# Patient Record
Sex: Female | Born: 1950
Health system: Southern US, Community
[De-identification: ages and names within clinical notes are randomized; demographics above are authoritative.]

## PROBLEM LIST (undated history)

## (undated) DIAGNOSIS — Z87442 Personal history of urinary calculi: Secondary | ICD-10-CM

## (undated) DIAGNOSIS — G5702 Lesion of sciatic nerve, left lower limb: Secondary | ICD-10-CM

## (undated) DIAGNOSIS — D72829 Elevated white blood cell count, unspecified: Secondary | ICD-10-CM

## (undated) DIAGNOSIS — E785 Hyperlipidemia, unspecified: Secondary | ICD-10-CM

## (undated) DIAGNOSIS — F32A Depression, unspecified: Secondary | ICD-10-CM

## (undated) DIAGNOSIS — K219 Gastro-esophageal reflux disease without esophagitis: Secondary | ICD-10-CM

## (undated) DIAGNOSIS — R072 Precordial pain: Secondary | ICD-10-CM

## (undated) DIAGNOSIS — D509 Iron deficiency anemia, unspecified: Secondary | ICD-10-CM

## (undated) DIAGNOSIS — H9190 Unspecified hearing loss, unspecified ear: Secondary | ICD-10-CM

## (undated) DIAGNOSIS — F419 Anxiety disorder, unspecified: Secondary | ICD-10-CM

## (undated) DIAGNOSIS — E8881 Metabolic syndrome: Secondary | ICD-10-CM

## (undated) DIAGNOSIS — E669 Obesity, unspecified: Secondary | ICD-10-CM

## (undated) DIAGNOSIS — E88819 Insulin resistance, unspecified: Secondary | ICD-10-CM

## (undated) DIAGNOSIS — F329 Major depressive disorder, single episode, unspecified: Secondary | ICD-10-CM

## (undated) DIAGNOSIS — F418 Other specified anxiety disorders: Secondary | ICD-10-CM

## (undated) DIAGNOSIS — M47812 Spondylosis without myelopathy or radiculopathy, cervical region: Secondary | ICD-10-CM

## (undated) DIAGNOSIS — I2510 Atherosclerotic heart disease of native coronary artery without angina pectoris: Secondary | ICD-10-CM

## (undated) DIAGNOSIS — I10 Essential (primary) hypertension: Secondary | ICD-10-CM

## (undated) DIAGNOSIS — E1140 Type 2 diabetes mellitus with diabetic neuropathy, unspecified: Secondary | ICD-10-CM

## (undated) DIAGNOSIS — E119 Type 2 diabetes mellitus without complications: Secondary | ICD-10-CM

## (undated) DIAGNOSIS — T8859XA Other complications of anesthesia, initial encounter: Secondary | ICD-10-CM

## (undated) HISTORY — PX: BREAST ENHANCEMENT SURGERY: SHX7

## (undated) HISTORY — DX: Hyperlipidemia, unspecified: E78.5

## (undated) HISTORY — DX: Anxiety disorder, unspecified: F41.9

## (undated) HISTORY — PX: TONSILLECTOMY AND ADENOIDECTOMY: SUR1326

## (undated) HISTORY — DX: Precordial pain: R07.2

## (undated) HISTORY — DX: Essential (primary) hypertension: I10

## (undated) HISTORY — PX: LUMBAR LAMINECTOMY: SHX95

## (undated) HISTORY — DX: Obesity, unspecified: E66.9

## (undated) HISTORY — PX: TUBAL LIGATION: SHX77

## (undated) HISTORY — PX: ROTATOR CUFF REPAIR: SHX139

## (undated) HISTORY — DX: Type 2 diabetes mellitus without complications: E11.9

## (undated) HISTORY — DX: Major depressive disorder, single episode, unspecified: F32.9

## (undated) HISTORY — DX: Depression, unspecified: F32.A

## (undated) HISTORY — PX: OTHER SURGICAL HISTORY: SHX169

## (undated) HISTORY — DX: Spondylosis without myelopathy or radiculopathy, cervical region: M47.812

---

## 1997-12-02 ENCOUNTER — Ambulatory Visit (HOSPITAL_COMMUNITY): Admission: RE | Admit: 1997-12-02 | Discharge: 1997-12-02 | Payer: Self-pay | Admitting: Psychiatry

## 1998-01-06 ENCOUNTER — Other Ambulatory Visit: Admission: RE | Admit: 1998-01-06 | Discharge: 1998-01-06 | Payer: Self-pay | Admitting: *Deleted

## 1998-09-13 ENCOUNTER — Encounter: Admission: RE | Admit: 1998-09-13 | Discharge: 1998-12-12 | Payer: Self-pay | Admitting: Psychiatry

## 1998-11-05 ENCOUNTER — Encounter: Payer: Self-pay | Admitting: Emergency Medicine

## 1998-11-05 ENCOUNTER — Emergency Department (HOSPITAL_COMMUNITY): Admission: EM | Admit: 1998-11-05 | Discharge: 1998-11-05 | Payer: Self-pay | Admitting: Emergency Medicine

## 1998-12-04 ENCOUNTER — Other Ambulatory Visit: Admission: RE | Admit: 1998-12-04 | Discharge: 1998-12-04 | Payer: Self-pay | Admitting: *Deleted

## 1999-05-01 ENCOUNTER — Encounter: Payer: Self-pay | Admitting: Emergency Medicine

## 1999-05-01 ENCOUNTER — Emergency Department (HOSPITAL_COMMUNITY): Admission: EM | Admit: 1999-05-01 | Discharge: 1999-05-01 | Payer: Self-pay | Admitting: Emergency Medicine

## 1999-10-12 ENCOUNTER — Ambulatory Visit (HOSPITAL_COMMUNITY): Admission: RE | Admit: 1999-10-12 | Discharge: 1999-10-12 | Payer: Self-pay | Admitting: Psychiatry

## 1999-10-12 ENCOUNTER — Encounter: Payer: Self-pay | Admitting: Psychiatry

## 1999-11-08 ENCOUNTER — Emergency Department (HOSPITAL_COMMUNITY): Admission: EM | Admit: 1999-11-08 | Discharge: 1999-11-08 | Payer: Self-pay | Admitting: Emergency Medicine

## 2000-01-19 ENCOUNTER — Encounter: Payer: Self-pay | Admitting: Emergency Medicine

## 2000-01-19 ENCOUNTER — Emergency Department (HOSPITAL_COMMUNITY): Admission: EM | Admit: 2000-01-19 | Discharge: 2000-01-19 | Payer: Self-pay | Admitting: Emergency Medicine

## 2000-02-07 ENCOUNTER — Other Ambulatory Visit: Admission: RE | Admit: 2000-02-07 | Discharge: 2000-02-07 | Payer: Self-pay | Admitting: *Deleted

## 2001-02-09 ENCOUNTER — Other Ambulatory Visit: Admission: RE | Admit: 2001-02-09 | Discharge: 2001-02-09 | Payer: Self-pay | Admitting: *Deleted

## 2001-02-19 ENCOUNTER — Encounter: Payer: Self-pay | Admitting: *Deleted

## 2001-02-19 ENCOUNTER — Encounter: Admission: RE | Admit: 2001-02-19 | Discharge: 2001-02-19 | Payer: Self-pay | Admitting: *Deleted

## 2001-03-06 ENCOUNTER — Other Ambulatory Visit: Admission: RE | Admit: 2001-03-06 | Discharge: 2001-03-06 | Payer: Self-pay | Admitting: *Deleted

## 2001-03-06 ENCOUNTER — Encounter (INDEPENDENT_AMBULATORY_CARE_PROVIDER_SITE_OTHER): Payer: Self-pay | Admitting: *Deleted

## 2001-04-20 ENCOUNTER — Encounter (INDEPENDENT_AMBULATORY_CARE_PROVIDER_SITE_OTHER): Payer: Self-pay | Admitting: *Deleted

## 2001-04-20 ENCOUNTER — Ambulatory Visit (HOSPITAL_COMMUNITY): Admission: RE | Admit: 2001-04-20 | Discharge: 2001-04-20 | Payer: Self-pay | Admitting: Gastroenterology

## 2001-06-01 ENCOUNTER — Encounter: Payer: Self-pay | Admitting: Emergency Medicine

## 2001-06-01 ENCOUNTER — Inpatient Hospital Stay (HOSPITAL_COMMUNITY): Admission: EM | Admit: 2001-06-01 | Discharge: 2001-06-02 | Payer: Self-pay | Admitting: Emergency Medicine

## 2001-08-10 ENCOUNTER — Ambulatory Visit (HOSPITAL_BASED_OUTPATIENT_CLINIC_OR_DEPARTMENT_OTHER): Admission: RE | Admit: 2001-08-10 | Discharge: 2001-08-10 | Payer: Self-pay | Admitting: Orthopedic Surgery

## 2001-08-27 ENCOUNTER — Ambulatory Visit (HOSPITAL_COMMUNITY): Admission: RE | Admit: 2001-08-27 | Discharge: 2001-08-27 | Payer: Self-pay | Admitting: Pulmonary Disease

## 2001-08-27 ENCOUNTER — Encounter: Payer: Self-pay | Admitting: Pulmonary Disease

## 2002-12-08 ENCOUNTER — Ambulatory Visit (HOSPITAL_COMMUNITY): Admission: RE | Admit: 2002-12-08 | Discharge: 2002-12-08 | Payer: Self-pay | Admitting: Family Medicine

## 2002-12-08 ENCOUNTER — Encounter: Payer: Self-pay | Admitting: Family Medicine

## 2003-10-18 ENCOUNTER — Emergency Department (HOSPITAL_COMMUNITY): Admission: EM | Admit: 2003-10-18 | Discharge: 2003-10-19 | Payer: Self-pay | Admitting: Emergency Medicine

## 2004-04-23 ENCOUNTER — Ambulatory Visit (HOSPITAL_COMMUNITY): Admission: RE | Admit: 2004-04-23 | Discharge: 2004-04-23 | Payer: Self-pay | Admitting: Internal Medicine

## 2005-01-06 ENCOUNTER — Emergency Department (HOSPITAL_COMMUNITY): Admission: EM | Admit: 2005-01-06 | Discharge: 2005-01-06 | Payer: Self-pay | Admitting: Emergency Medicine

## 2005-03-28 ENCOUNTER — Ambulatory Visit: Payer: Self-pay | Admitting: Pulmonary Disease

## 2005-05-08 ENCOUNTER — Ambulatory Visit: Payer: Self-pay | Admitting: Pulmonary Disease

## 2005-05-09 ENCOUNTER — Ambulatory Visit: Payer: Self-pay | Admitting: Cardiology

## 2005-05-21 ENCOUNTER — Ambulatory Visit (HOSPITAL_COMMUNITY): Admission: RE | Admit: 2005-05-21 | Discharge: 2005-05-21 | Payer: Self-pay | Admitting: Pulmonary Disease

## 2005-06-26 ENCOUNTER — Ambulatory Visit: Payer: Self-pay | Admitting: Internal Medicine

## 2005-07-15 HISTORY — PX: CORONARY STENT PLACEMENT: SHX1402

## 2005-07-24 ENCOUNTER — Inpatient Hospital Stay (HOSPITAL_COMMUNITY): Admission: EM | Admit: 2005-07-24 | Discharge: 2005-07-25 | Payer: Self-pay | Admitting: Emergency Medicine

## 2005-07-28 ENCOUNTER — Emergency Department (HOSPITAL_COMMUNITY): Admission: EM | Admit: 2005-07-28 | Discharge: 2005-07-28 | Payer: Self-pay | Admitting: *Deleted

## 2005-07-30 ENCOUNTER — Encounter: Admission: RE | Admit: 2005-07-30 | Discharge: 2005-07-30 | Payer: Self-pay | Admitting: Cardiology

## 2005-09-11 ENCOUNTER — Encounter: Admission: RE | Admit: 2005-09-11 | Discharge: 2005-09-26 | Payer: Self-pay | Admitting: Neurosurgery

## 2005-10-28 ENCOUNTER — Emergency Department (HOSPITAL_COMMUNITY): Admission: EM | Admit: 2005-10-28 | Discharge: 2005-10-28 | Payer: Self-pay | Admitting: Emergency Medicine

## 2006-11-17 ENCOUNTER — Ambulatory Visit (HOSPITAL_COMMUNITY): Admission: RE | Admit: 2006-11-17 | Discharge: 2006-11-19 | Payer: Self-pay | Admitting: Neurosurgery

## 2007-06-17 ENCOUNTER — Inpatient Hospital Stay (HOSPITAL_BASED_OUTPATIENT_CLINIC_OR_DEPARTMENT_OTHER): Admission: RE | Admit: 2007-06-17 | Discharge: 2007-06-17 | Payer: Self-pay | Admitting: Cardiology

## 2007-06-17 HISTORY — PX: CARDIAC CATHETERIZATION: SHX172

## 2007-06-24 ENCOUNTER — Encounter: Admission: RE | Admit: 2007-06-24 | Discharge: 2007-06-24 | Payer: Self-pay | Admitting: Cardiology

## 2008-04-15 ENCOUNTER — Ambulatory Visit (HOSPITAL_COMMUNITY): Admission: RE | Admit: 2008-04-15 | Discharge: 2008-04-15 | Payer: Self-pay | Admitting: Internal Medicine

## 2008-04-29 ENCOUNTER — Encounter: Admission: RE | Admit: 2008-04-29 | Discharge: 2008-04-29 | Payer: Self-pay | Admitting: Internal Medicine

## 2008-05-16 ENCOUNTER — Encounter: Payer: Self-pay | Admitting: Gastroenterology

## 2008-06-28 ENCOUNTER — Ambulatory Visit: Payer: Self-pay | Admitting: Gastroenterology

## 2008-06-28 DIAGNOSIS — K219 Gastro-esophageal reflux disease without esophagitis: Secondary | ICD-10-CM

## 2008-06-28 DIAGNOSIS — I2510 Atherosclerotic heart disease of native coronary artery without angina pectoris: Secondary | ICD-10-CM | POA: Insufficient documentation

## 2008-06-28 HISTORY — DX: Gastro-esophageal reflux disease without esophagitis: K21.9

## 2008-06-28 HISTORY — DX: Atherosclerotic heart disease of native coronary artery without angina pectoris: I25.10

## 2008-06-29 ENCOUNTER — Encounter: Payer: Self-pay | Admitting: Gastroenterology

## 2008-07-15 HISTORY — PX: COLONOSCOPY: SHX174

## 2008-07-15 HISTORY — PX: FOREARM FRACTURE SURGERY: SHX649

## 2008-07-27 ENCOUNTER — Ambulatory Visit: Payer: Self-pay | Admitting: Gastroenterology

## 2008-07-27 ENCOUNTER — Encounter: Payer: Self-pay | Admitting: Gastroenterology

## 2008-07-29 ENCOUNTER — Encounter: Payer: Self-pay | Admitting: Gastroenterology

## 2008-11-03 ENCOUNTER — Encounter: Admission: RE | Admit: 2008-11-03 | Discharge: 2008-11-03 | Payer: Self-pay | Admitting: Internal Medicine

## 2009-05-03 ENCOUNTER — Encounter: Admission: RE | Admit: 2009-05-03 | Discharge: 2009-05-03 | Payer: Self-pay | Admitting: Internal Medicine

## 2010-03-30 ENCOUNTER — Ambulatory Visit: Payer: Self-pay | Admitting: Cardiovascular Disease

## 2010-04-27 ENCOUNTER — Encounter: Payer: Self-pay | Admitting: Emergency Medicine

## 2010-04-27 ENCOUNTER — Ambulatory Visit: Payer: Self-pay | Admitting: Cardiology

## 2010-04-27 ENCOUNTER — Encounter: Admission: RE | Admit: 2010-04-27 | Discharge: 2010-04-27 | Payer: Self-pay | Admitting: Cardiology

## 2010-05-11 ENCOUNTER — Ambulatory Visit: Payer: Self-pay | Admitting: Emergency Medicine

## 2010-05-11 DIAGNOSIS — E669 Obesity, unspecified: Secondary | ICD-10-CM

## 2010-05-11 DIAGNOSIS — R053 Chronic cough: Secondary | ICD-10-CM | POA: Insufficient documentation

## 2010-05-11 DIAGNOSIS — R05 Cough: Secondary | ICD-10-CM | POA: Insufficient documentation

## 2010-05-14 ENCOUNTER — Telehealth (INDEPENDENT_AMBULATORY_CARE_PROVIDER_SITE_OTHER): Payer: Self-pay | Admitting: *Deleted

## 2010-07-26 ENCOUNTER — Ambulatory Visit: Payer: Self-pay | Admitting: Cardiology

## 2010-08-05 ENCOUNTER — Encounter: Payer: Self-pay | Admitting: *Deleted

## 2010-08-05 ENCOUNTER — Encounter: Payer: Self-pay | Admitting: Family Medicine

## 2010-08-16 NOTE — Letter (Signed)
Summary: Chest 2 View/[facility]  Chest 2 View/[facility]   Imported By: Sherian Rein 05/16/2010 12:16:34  _____________________________________________________________________  External Attachment:    Type:   Image     Comment:   External Document

## 2010-08-16 NOTE — Procedures (Signed)
Summary: Hold on Medication (Plavix) for Procedure  Hold on Medication (Plavix) for Procedure   Imported By: Esmeralda Links D'jimraou 07/07/2008 13:18:23  _____________________________________________________________________  External Attachment:    Type:   Image     Comment:   External Document

## 2010-08-16 NOTE — Letter (Signed)
Summary: Patient [facility] Biopsy Results  [facility]  [address] [place], [place] [address]   Phone: [phone]  Fax: [phone]        July 29, 2008 MRN: [id number]    Destiny Ballard [address] [place], [place]  [address]    Dear Ms. Choplin,  I am pleased to inform you that the biopsies taken during your recent endoscopic examination did not show any evidence of cancer upon pathologic examination. The stomach biopsy showed mild gastritis. The duodenal biopsy was normal.  Continue with the treatment plan as outlined on the day of your      exam.  Please call us if you are having persistent problems or have questions about your condition that have not been fully answered at this time.  Sincerely,  Meryl Dare MD [facility]  This letter has been electronically signed by your physician.

## 2010-08-16 NOTE — Letter (Signed)
Summary: [facility]   Imported By: Lester  05/29/2010 07:25:19  _____________________________________________________________________  External Attachment:    Type:   Image     Comment:   External Document

## 2010-08-16 NOTE — Procedures (Signed)
Summary: Colonoscopy   Colonoscopy  Procedure date:  04/20/2001  Findings:      Results: Normal. Location:  [facility].    Procedures Next Due Date:    Colonoscopy: 04/2011  Patient Name: Destiny Ballard, Destiny Ballard. MRN: [id number] Procedure Procedures: Colonoscopy CPT: [phone].  Personnel: Endoscopist: Griffith Citron, MD, [facility].  Exam Location: Exam performed in Endoscopy Suite. Outpatient  Patient Consent: Procedure, Alternatives, Risks and Benefits discussed, consent obtained, from patient.  Indications  Evaluation of: Polyps seen on prior Colonoscopy.  History  Current Medications: Patient is taking an non-steroidal medication. Other: Fluoxetidine Antihistamines: Allegra Other: Toprol Other: Premarin Other: Provera Other: Prevacid Other: Naprosyn Other: Aspirin Other: Levbid  Medical/ Surgical History: Depression, Hypertension, Dyspepsia, Arthralgias, Irritable Bowel Syndrome,  Allergies: No known allergies.  Patient Habits Patient does not smoke. non-drinker drinks per day.  Pre-Exam Physical: Performed Apr 20, 2001. Cardio-pulmonary exam, Rectal exam, HEENT exam , Abdominal exam, Extremity exam, Mental status exam WNL.  Exam Exam: Extent of exam reached: Cecum, extent intended: Cecum.  The cecum was identified by appendiceal orifice and IC valve. Patient position: on left side. Duration of exam: 10 minutes. Colon retroflexion performed. Images taken. ASA Classification: I. Tolerance: excellent.  Monitoring: Pulse and BP monitoring, Oximetry used. Supplemental O2 given.  Colon Prep Used Visicol for colon prep. Dose Used: 28 tablets. Prep results: excellent.  Sedation Meds: Patient assessed and found to be appropriate for moderate (conscious) sedation. Sedation was managed by the Endoscopist. Versed 10 mg. Fentanyl 100 mcg.  Comments: Adjustable scope used. Findings NORMAL EXAM: to Rectum. No colorectal neoplasia.  No rectal lesion corresponding to  CT findings.    Comments: Time 1; tech 1; prep 1; Total = 3 Assessment Normal examination.  Events  Unplanned Interventions: No intervention was required.  Unplanned Events: There were no complications. Plans  Post Exam Instructions: Resume previous diet: 2 hours. Restart medications: tonight.  Medication Plan: Continue current medications.  Patient Education: Patient given standard instructions for: Yearly hemoccult testing recommended. Patient instructed to get routine colonoscopy every 10 years.  Disposition: After procedure patient sent to recovery. After recovery patient sent home.  Scheduling/Referral: Follow-Up prn.   This report was created from the original endoscopy report, which was reviewed and signed by the above listed endoscopist.    cc: Lonell Grandchild. Theda Belfast, MD

## 2010-08-16 NOTE — Assessment & Plan Note (Signed)
Summary: cough   Visit Type:  Initial Consult Copy to:  Dr. Deborah Chalk Primary Provider/Referring Provider:  Abbe Amsterdam, MD  CC:  Pulmonary Consult - cough..  History of Present Illness: 60 yo never smoker, hx CAD s/p PTCI, HTN, GERD, HA's, depression. Has been seen here for dyspnea and cough in the past by DS. She knows that she has some allergies that impact her cough, ? dogs, ? second-hand smoke. She coughs every day, non-productive. Has a tickle in her throat, occas loses her voice or gets hoarse. For last two weeks has been worse - more freq, some sneezing, runny nose. Has reflux symptoms at night, wakes in middle of night coughing. Daily bouts of GERD symptoms after she eats.   Current Medications (verified): 1)  Metoprolol Tartrate 100 Mg Tabs (Metoprolol Tartrate) .... Take 1 Tablet By Mouth Once A Day 2)  Crestor 20 Mg Tabs (Rosuvastatin Calcium) .... Take 1 Tablet By Mouth Once A Day 3)  Diovan Hct 320-25 Mg Tabs (Valsartan-Hydrochlorothiazide) .... Take 1 Tablet By Mouth Once A Day 4)  Plavix 75 Mg Tabs (Clopidogrel Bisulfate) .... Take One Tablet By Mouth Every Other Day 5)  Fluoxetine Hcl 40 Mg Caps (Fluoxetine Hcl) .... Take 1 Tablet By Mouth Once A Day 6)  Adult Aspirin Ec Low Strength 81 Mg Tbec (Aspirin) .... Take 1 Tablet By Mouth Once A Day 7)  Benzonatate 100 Mg Caps (Benzonatate) .[name].. 1-2 Cap Three Times A Day 8)  Vitamin D (Ergocalciferol) 50000 Unit Caps (Ergocalciferol) .... Once Weekly 9)  Proair Hfa 108 (90 Base) Mcg/act Aers (Albuterol Sulfate) .... 2 Puffs Every 4-6 Hours As Needed 10)  Fluticasone Propionate 50 Mcg/act Susp (Fluticasone Propionate) .... 2 Sprays Each Nostril Once Daily 11)  Vitamin E 200 Unit Caps (Vitamin E) .... Take 1 Capsule By Mouth Once A Day 12)  Allegra Allergy 180 Mg Tabs (Fexofenadine Hcl) .... Take 1 Tablet By Mouth Once A Day 13)  Multivitamins  Tabs (Multiple Vitamin) .... Take 1 Tablet By Mouth Once A Day 14)  Vitamin B-12 1000  Mcg Tabs (Cyanocobalamin) .... As Needed 15)  Calcium + D 600-200 Mg-Unit Tabs (Calcium Carbonate-Vitamin D) .... Take 1 Tablet By Mouth Once A Day  Allergies (verified): 1)  Prednisone  Past History:  Past Medical History: Reviewed history from 06/27/2008 and no changes required. GERD Hypertension Allergies  Chronic headaches Atherosclerotic cardiovascular disease Anxiety Disorder Depression Recent suicide attempt Hyperlipidemia Cervical spondylosis  Past Surgical History: Reviewed history from 06/28/2008 and no changes required. Breast implants Tubal Ligation cardiac cath, Mid LAD stent x 3, 07/2005 ACF fusion and plating-11/2006  Family History: Reviewed history from 06/28/2008 and no changes required. Family History of Diabetes: Mother, Sister, Brother Family History of Heart Disease: Mother Father died of a suicide.  Mother had Cancer--unsure type sister deceased from lung ca  Social History: Reviewed history from 06/28/2008 and no changes required. Married Patient has never smoked.  Alcohol Use - no Daily Caffeine Use Illicit Drug Use - no 3 children Works with [employer]  Review of Systems       The patient complains of shortness of breath with activity, productive cough, indigestion, abdominal pain, headaches, sneezing, depression, and joint stiffness or pain.  The patient denies shortness of breath at rest, non-productive cough, coughing up blood, chest pain, irregular heartbeats, acid heartburn, loss of appetite, weight change, difficulty swallowing, sore throat, tooth/dental problems, nasal congestion/difficulty breathing through nose, itching, anxiety, hand/feet swelling, rash, change in color of mucus,  and fever.         Wt gain of about 40 lbs since 2006.    Vital Signs:  Patient profile:   60 year old female Height:      60 inches Weight:      175.38 pounds BMI:     34.38 O2 Sat:      96 % on Room air Temp:     98.4 degrees F oral Pulse rate:   95 /  minute BP sitting:   152 / 84  (left arm) Cuff size:   regular  Vitals Entered By: Gweneth Dimitri RN (May 11, 2010 3:19 PM)  O2 Flow:  Room air CC: Pulmonary Consult - cough. Comments Medications reviewed with patient Daytime contact number verified with patient. Gweneth Dimitri RN  May 11, 2010 3:20 PM    Physical Exam  General:  Well developed, well nourished, no acute distress.obese.   Head:  Normocephalic and atraumatic. Eyes:  conjunctiva and sclera clear Mouth:  No deformity or lesions, dentition normal. Neck:  Supple; no masses or thyromegaly. Lungs:  Clear throughout to auscultation. Heart:  Regular rate and rhythm; no murmurs, rubs,  or bruits. Abdomen:  obese, non-tender Msk:  Symmetrical with no gross deformities. Normal posture. Pulses:  Normal pulses noted. Extremities:  No clubbing, cyanosis, edema or deformities noted. Neurologic:  Alert and  oriented x4;  grossly normal neurologically. Skin:  Intact without significant lesions or rashes. Psych:  Alert and cooperative. Normal mood and affect.   Impression & Recommendations:  Problem # 1:  COUGH (ICD-786.2)  With influences of both GERD and allergies.  - PFT to eval for AFL - rx allergies: increase fluticasone to two times a day, contin allegra, add NSWs - rx GERD: add prilosec two times a day till next visit then will consider decreas to once daily - rov in 1 mo  Orders: Consultation Level IV (06301)  Problem # 2:  CORONARY ARTERY DISEASE (ICD-414.00)  Her updated medication list for this problem includes:    Metoprolol Tartrate 100 Mg Tabs (Metoprolol tartrate) .[name]... Take 1 tablet by mouth once a day    Diovan Hct 320-25 Mg Tabs (Valsartan-hydrochlorothiazide) .[name]... Take 1 tablet by mouth once a day    Plavix 75 Mg Tabs (Clopidogrel bisulfate) .[name]... Take one tablet by mouth every other day    Adult Aspirin Ec Low Strength 81 Mg Tbec (Aspirin) .[name]... Take 1 tablet by mouth once a day  Problem #  3:  MORBID OBESITY (ICD-278.01)  Orders: Consultation Level IV (60109)  Medications Added to Medication List This Visit: 1)  Metoprolol Tartrate 100 Mg Tabs (Metoprolol tartrate) .... Take 1 tablet by mouth once a day 2)  Crestor 20 Mg Tabs (Rosuvastatin calcium) .... Take 1 tablet by mouth once a day 3)  Diovan Hct 320-25 Mg Tabs (Valsartan-hydrochlorothiazide) .... Take 1 tablet by mouth once a day 4)  Plavix 75 Mg Tabs (Clopidogrel bisulfate) .... Take one tablet by mouth every other day 5)  Benzonatate 100 Mg Caps (Benzonatate) .[name].. 1-2 cap three times a day 6)  Vitamin D (ergocalciferol) 50000 Unit Caps (Ergocalciferol) .... Once weekly 7)  Proair Hfa 108 (90 Base) Mcg/act Aers (Albuterol sulfate) .... 2 puffs every 4-6 hours as needed 8)  Fluticasone Propionate 50 Mcg/act Susp (Fluticasone propionate) .... 2 sprays each nostril once daily 9)  Vitamin E 200 Unit Caps (Vitamin e) .... Take 1 capsule by mouth once a day 10)  Allegra Allergy 180 Mg Tabs (  Fexofenadine hcl) .... Take 1 tablet by mouth once a day 11)  Multivitamins Tabs (Multiple vitamin) .... Take 1 tablet by mouth once a day 12)  Vitamin B-12 1000 Mcg Tabs (Cyanocobalamin) .... As needed 13)  Calcium + D 600-200 Mg-unit Tabs (Calcium carbonate-vitamin d) .... Take 1 tablet by mouth once a day 14)  Omeprazole 20 Mg Cpdr (Omeprazole) .[name].. 1 by mouth two times a day for 1 month then decrease to 1 once daily  Patient Instructions: 1)  Start nasal saline washes every day 2)  Increase your fluticasone spray to 2 sprays each nostril two times a day  3)  Continue fexofenadine 180mg  once daily  4)  Start omeprazole 20mg  by mouth two times a day until next visit. we may decrease to once daily at that time 5)  Full PFT's at the time of your next visit 6)  Follow up with [facility] in 1 month Prescriptions: OMEPRAZOLE 20 MG CPDR (OMEPRAZOLE) 1 by mouth two times a day for 1 month then decrease to 1 once daily  #60 x 6   Entered and  Authorized by:   Leslye Peer MD   Signed by:   Leslye Peer MD on 05/11/2010   Method used:   Electronically to        [facility] [address] [id number] (retail)       [address]       [place], [place]  [address]       Ph: [phone]       Fax: [phone]   RxID:   [id number]    Immunization History:  Influenza Immunization History:    Influenza:  historical (04/14/2010)

## 2010-08-16 NOTE — Procedures (Signed)
Summary: Colonoscopy   Colonoscopy  Procedure date:  07/27/2008  Findings:      Location:  [facility].    Procedures Next Due Date:    Colonoscopy: 07/2018  COLONOSCOPY PROCEDURE REPORT  PATIENT:  Destiny Ballard, Destiny Ballard  MR#:  [id number] BIRTHDATE:   06-Oct-1950   GENDER:   female  ENDOSCOPIST:   Venita Lick. Russella Dar, MD, [facility] Referred by: Abbe Amsterdam, M.D.  PROCEDURE DATE:  07/27/2008 PROCEDURE:  Colonoscopy, diagnostic ASA CLASS:   Class II INDICATIONS: 1) iron deficiency anemia  2) hematochezia   MEDICATIONS:    Fentanyl 100 mcg IV, Versed 10 mg IV  DESCRIPTION OF PROCEDURE:   After the risks benefits and alternatives of the procedure were thoroughly explained, informed consent was obtained.  Digital rectal exam was performed and revealed no abnormalities.   The LB [id number]-H180AL [id number] endoscope was introduced through the anus and advanced to the cecum, which was identified by both the appendix and ileocecal valve, without limitations.  The quality of the prep was excellent, using MoviPrep.  The instrument was then slowly withdrawn as the colon was fully examined. <<PROCEDUREIMAGES>>          <<OLD IMAGES>>  FINDINGS:  A normal appearing cecum, ileocecal valve, and appendiceal orifice were identified. The ascending, hepatic flexure, transverse, splenic flexure, descending, sigmoid colon, and rectum appeared unremarkable. Retroflexed views in the rectum revealed no abnormalities. The time to cecum =  4.67  minutes. The scope was then withdrawn (time =  8  min) from the patient and the procedure completed.  COMPLICATIONS:   None  ENDOSCOPIC IMPRESSION:  1) Normal colonoscopy RECOMMENDATIONS:  1) upper endoscopy (EGD) today  REPEAT EXAM:   In 10 year(s) for Colonoscopy.   Venita Lick. Russella Dar, MD, [facility]    CC:

## 2010-08-16 NOTE — Letter (Signed)
Summary: Anticoagulation Modification Letter  [facility]  [address] [place], [place] [address]   Phone: [phone]  Fax: [phone]    June 29, 2008  Re:    Destiny Ballard DOB:    10/15/1950 MRN:    [id number]    Dear Dr. Deborah Chalk,   We have scheduled the above patient for an endoscopic procedure. Our records show that  he/she is on anticoagulation therapy. Please advise as to how long the patient may come off their therapy of Plavix prior to the scheduled procedure(s) on 07/27/08.   Please fax back/or route the completed form to [place] at [phone].  Thank you for your help with this matter.  Sincerely,  Christie Nottingham CMA   Physician Recommendation:  Hold Plavix 7 days prior ________________  Hold Coumadin 5 days prior ____________  Other ______________________________

## 2010-08-16 NOTE — Procedures (Signed)
Summary: EGD   EGD  Procedure date:  07/27/2008  Findings:      Location: [facility]    ENDOSCOPY PROCEDURE REPORT  PATIENT:  Destiny Ballard, Destiny Ballard  MR#:  [id number] BIRTHDATE:   1951/06/28   GENDER:   female  ENDOSCOPIST:   Venita Lick. Russella Dar, MD, [facility] Referred by: Abbe Amsterdam, M.D.  PROCEDURE DATE:  07/27/2008 PROCEDURE:  EGD with biopsy ASA CLASS:   Class II INDICATIONS: iron deficiency anemia, GERD   MEDICATIONS:   There was residual sedation effect present from prior procedure, Versed 2 mg IV TOPICAL ANESTHETIC:   Exactacain Spray  DESCRIPTION OF PROCEDURE:   After the risks benefits and alternatives of the procedure were thoroughly explained, informed consent was obtained.  The LB GIF-H180 [id number] endoscope was introduced through the mouth and advanced to the second portion of the duodenum, without limitations.  The instrument was slowly withdrawn as the mucosa was fully examined. <<PROCEDUREIMAGES>>          <<OLD IMAGES>>  The duodenal bulb was normal in appearance, as was the postbulbar duodenum. In the descending duodenum, with standard forceps, a biopsy was obtained and sent to pathology.  Esophagitis was found in the distal esophagus. It was nonerosive and erythematous. LA Classification Grade 0.   Two polyps/nodules were found in the body of the stomach. They were 3 - 4 mm in size. With standard forceps, a biopsy was obtained and sent to pathology of both.  Mild chronic gastritis was found. It was atrophic, with decreased rugal folds  Otherwise the examination was normal.    Retroflexed views revealed no abnormalities.    The scope was then withdrawn from the patient and the procedure completed.  COMPLICATIONS:   None  ENDOSCOPIC IMPRESSION:  1) Normal duodenum in the descending duodenum  2) Esophagitis in the distal esophagus  3) 3 - 4 mm polyps, two polyps/nodules in the body of the stomach  4) Mild gastritis RECOMMENDATIONS:  1) await pathology  results  2) anti-reflux regimen  3) PPI qam  4) follow-up: primary MD 2 month(s) 5) begin Fe SO4 bid   Maxon Kresse T. Russella Dar, MD, [facility]  REPORT OF SURGICAL PATHOLOGY   Case #: [id number] Patient Name: Destiny Ballard, Destiny C. Office Chart Number:  N/A   MRN: [id number] Pathologist: Havery Moros, MD DOB/Age  Sep 04, 1950 (Age: 60)    Gender: F Date Taken:  07/27/2008 Date Received: 07/28/2008   FINAL DIAGNOSIS   ***MICROSCOPIC EXAMINATION AND DIAGNOSIS***   1.  DUODENUM, BIOPSY:  BENIGN SMALL BOWEL MUCOSA.  NO ACTIVE INFLAMMATION OR VILLOUS ATROPHY IDENTIFIED.   2.  STOMACH, BIOPSY:  POLYPOID GASTRIC MUCOSA WITH CHRONIC GASTRITIS AND HYPERPLASTIC EPITHELIAL CHANGES ASSOCIATED WITH A SMALL FOCUS OF INTESTINAL METAPLASIA.  NO HELICOBACTER PYLORI, DYSPLASIA OR MALIGNANCY IDENTIFIED.     COMMENT 2.  A Warthin-Starry stain is performed to determine the possibility of the presence of Helicobacter pylori. The Warthin-Starry stain is negative for organisms of Helicobacter pylori. The control(s) stained appropriately.  (BNS:kv 07-29-08)   kv Date Reported:  07/29/2008     Havery Moros, MD *** Electronically Signed Out By BNS ***    July 29, 2008 MRN: [id number]    Sarajean Abernethy [address] [place], [place]  [address]    Dear Ms. Cleckler,  I am pleased to inform you that the biopsies taken during your recent endoscopic examination did not show any evidence of cancer upon pathologic examination. The stomach biopsy showed mild gastritis. The duodenal biopsy was  normal.  Continue with the treatment plan as outlined on the day of your      exam.  Please call us if you are having persistent problems or have questions about your condition that have not been fully answered at this time.  Sincerely,  Meryl Dare MD [facility]  This letter has been electronically signed by your physician.   This report was created from the original endoscopy report, which was reviewed and signed by  the above listed endoscopist.    CC:

## 2010-08-16 NOTE — Progress Notes (Signed)
Summary: FYI  Phone Note From Other Clinic Call back at [phone]   Caller: [facility] Call For: byrum Summary of Call: States that they received a call from Crystal last week inquiring about breathing studies for pt, and as an fyi they don't have any breathing studies on pt. Initial call taken by: Darletta Moll,  May 14, 2010 10:31 AM  Follow-up for Phone Call        Spoke with Crystal and pt is already scheduled for PFT. Abigail Miyamoto RN  May 14, 2010 10:40 AM

## 2010-08-16 NOTE — Assessment & Plan Note (Signed)
Summary: CONSULT B4 COL PT ON [facility]   History of Present Illness Visit Type: consult Primary GI MD: Elie Goody MD [facility] Primary Provider: Abbe Amsterdam, MD Requesting Provider: Abbe Amsterdam, MD Chief Complaint: consult before colon on Plavix, rectal itching History of Present Illness:   This is a 60 year oldwhite female recently found to have an iron deficiency anemia. Hemoglobin 12.1 with a low MCV of 79.2, iron saturation 10%, iron level 39. She relates occasional rectal burning itching and occasional small amount of blood on the tissue paper when wiping after her stool. She previously underwent colonoscopy by Dr. Sharrell Ku in October 2002 that was normal. She has long-term alternating diarrhea and constipation. She relates occasional right-sided abdominal pain and reflux symptoms. She has been treated with Protonix for the past year with incomplete control of her symptoms.   GI Review of Systems    Reports abdominal pain, acid reflux, belching, bloating, and  heartburn.     Location of  Abdominal pain: RUQ.    Denies chest pain, dysphagia with liquids, dysphagia with solids, loss of appetite, nausea, vomiting, vomiting blood, weight loss, and  weight gain.      Reports constipation, diarrhea, and  rectal bleeding.     Denies anal fissure, black tarry stools, change in bowel habit, diverticulosis, fecal incontinence, heme positive stool, hemorrhoids, irritable bowel syndrome, jaundice, light color stool, liver problems, and  rectal pain.    Prior Medications Reviewed Using: List Brought by Patient  Updated Prior Medication List: PANTOPRAZOLE SODIUM 40 MG  TBEC (PANTOPRAZOLE SODIUM) 1 each day 30 minutes before meal TOPROL XL 50 MG XR24H-TAB (METOPROLOL SUCCINATE) Take 1 tablet by mouth once a day CRESTOR 10 MG TABS (ROSUVASTATIN CALCIUM) Take 1 tablet by mouth once a day DIOVAN 160 MG TABS (VALSARTAN) Take 1 tablet by mouth once a day PLAVIX 75 MG TABS (CLOPIDOGREL  BISULFATE) Take 1 tablet by mouth once a day FLUOXETINE HCL 40 MG CAPS (FLUOXETINE HCL) Take 1 tablet by mouth once a day ADULT ASPIRIN EC LOW STRENGTH 81 MG TBEC (ASPIRIN) Take 1 tablet by mouth once a day BENADRYL 25 MG TABS (DIPHENHYDRAMINE HCL) Take 1 tablet by mouth once a day  Current Allergies (reviewed today): PREDNISONE  Past Medical History:    Reviewed history from 06/27/2008 and no changes required:       GERD       Hypertension       Allergies        Chronic headaches       Atherosclerotic cardiovascular disease       Anxiety Disorder       Depression       Recent suicide attempt       Hyperlipidemia       Cervical spondylosis  Past Surgical History:    Breast implants    Tubal Ligation    cardiac cath, Mid LAD stent x 3, 07/2005    ACF fusion and plating-11/2006  Family History:    Reviewed history from 06/27/2008 and no changes required:       Family History of Diabetes: Mother, Sister, Brother       Family History of Heart Disease: Mother       Father died of a suicide.        Mother had Cancer--unsure type  Social History:    Reviewed history from 06/27/2008 and no changes required:       Married       Patient has never smoked.  Alcohol Use - no       Daily Caffeine Use       Illicit Drug Use - no  Risk Factors:  Drug use:  no  Review of Systems       The patient complains of thirst - excessive and urination changes/pain.         The pertinent positives and negatives are noted as above and in the HPI. All other ROS were negative.   Vital Signs:  Patient Profile:   60 Years Old Female Height:     60 inches Weight:      166 pounds BMI:     32.54 Pulse rate:   80 / minute Pulse rhythm:   regular BP sitting:   126 / 72  (left arm) Cuff size:   regular  Vitals Entered By: Francee Piccolo CMA (June 28, 2008 2:52 PM)                  Physical Exam  General:     Well developed, well nourished, no acute distress.obese.     Head:     Normocephalic and atraumatic. Eyes:     PERRLA, no icterus. Ears:     Normal auditory acuity. Mouth:     No deformity or lesions, dentition normal. Neck:     Supple; no masses or thyromegaly. Lungs:     Clear throughout to auscultation. Heart:     Regular rate and rhythm; no murmurs, rubs,  or bruits. Abdomen:     Soft, nontender and nondistended. No masses, hepatosplenomegaly or hernias noted. Normal bowel sounds. Rectal:     deferred until time of colonoscopy.   Msk:     Symmetrical with no gross deformities. Normal posture. Pulses:     Normal pulses noted. Extremities:     No clubbing, cyanosis, edema or deformities noted. Neurologic:     Alert and  oriented x4;  grossly normal neurologically. Skin:     Intact without significant lesions or rashes. Cervical Nodes:     No significant cervical adenopathy. Psych:     Alert and cooperative. Normal mood and affect.  Impression & Recommendations:  Problem # 1:  ANEMIA-IRON DEFICIENCY (ICD-280.9) Rule out gastrointestinal losses from colorectal neoplasms, AVMs, ulcer disease and other disorders. In addition she has had alternating diarrhea and constipation associated with mild right upper quadrant pain. Rule out irritable bowel syndrome. The risks, benefits and alternatives to colonoscopy with possible biopsy and possible polypectomy were discussed with the patient and they consent to proceed. The procedure will be scheduled electively.The risks, benefits and alternatives to endoscopy with possible biopsy and possible dilation were discussed with the patient and they consent to proceed. The procedure will be scheduled electively. Hold iron for 7 days prior to the procedures. Orders: Colon/Endo (Colon/Endo)   Problem # 2:  GERD (ICD-530.81) Continue protonix 40 mg q.a.m. along with standard antireflux measures. Upper endoscopy as in problem #1.   Problem # 3:  CORONARY ARTERY DISEASE (ICD-414.00) Plan for a  seven-day hold on Plavix. She may continue aspirin throughout the time of the procedures. Will obtain clearance from Dr. Delfin Edis.  Patient Instructions: 1)  You have been scheduled for a EGD/Colonoscopy.  2)  Colonoscopy brochure given. 3)  Conscious Sedation brochure given. 4)  Upper Endoscopy brochure given. 5)  Avoid foods high in acid content (tomatoes, citrus juices, spicy foods). Avoid eating within 3 to 4 hours of lying down or before exercising. Do not over eat; try  smaller more frequent meals. Elevate head of bed four inches when sleeping. 6)  Copy Sent To: Abbe Amsterdam, MD  Prescriptions: MOVIPREP 100 GM  SOLR (PEG-KCL-NACL-NASULF-NA ASC-C) As per prep instructions.  #1 x 0   Entered by:   Christie Nottingham CMA   Authorized by:   Meryl Dare MD [facility]   Signed by:   Christie Nottingham CMA on 06/28/2008   Method used:   Electronically to        [id number] (retail)       [address]       [place], [place]  [address]       Ph: [phone]       Fax: [phone]   RxID:   [id number]

## 2010-11-17 ENCOUNTER — Emergency Department (HOSPITAL_BASED_OUTPATIENT_CLINIC_OR_DEPARTMENT_OTHER)
Admission: EM | Admit: 2010-11-17 | Discharge: 2010-11-17 | Disposition: A | Discharge status: Home / Self Care | Payer: No Typology Code available for payment source | Attending: Emergency Medicine | Admitting: Emergency Medicine

## 2010-11-17 DIAGNOSIS — Z79899 Other long term (current) drug therapy: Secondary | ICD-10-CM | POA: Insufficient documentation

## 2010-11-17 DIAGNOSIS — E7800 Pure hypercholesterolemia, unspecified: Secondary | ICD-10-CM | POA: Insufficient documentation

## 2010-11-17 DIAGNOSIS — H18829 Corneal disorder due to contact lens, unspecified eye: Secondary | ICD-10-CM | POA: Insufficient documentation

## 2010-11-17 DIAGNOSIS — H5789 Other specified disorders of eye and adnexa: Secondary | ICD-10-CM | POA: Insufficient documentation

## 2010-11-27 NOTE — H&P (Signed)
NAME:  Destiny Ballard, Destiny Ballard              ACCOUNT NO.:  [id number]   MEDICAL RECORD NO.:  [id number]          PATIENT TYPE:  OUT   LOCATION:  [facility]                          FACILITY:  [facility]   PHYSICIAN:  Colleen Can. Deborah Chalk, M.D.DATE OF BIRTH:  December 13, 1950   DATE OF ADMISSION:  06/17/2007  DATE OF DISCHARGE:                              HISTORY & PHYSICAL   CHIEF COMPLAINT:  Chest pain.   HISTORY OF PRESENT ILLNESS:  Destiny Ballard is a 60 year old white female.  She has a known history of ischemic heart disease.  She had a previous  Cypher stent placed to the mid left anterior descending in January 2007.  She has been seen back for a routine check in October.  Just prior to  that visit she had had a suicide attempt.  She had stopped all of her  medicines at that point in time.  She overdosed on her husband's  narcotics.  She is now under the care of psychiatrist and back on  Prozac.  We have restarted Plavix and Diovan initially for better blood  pressure control.  She came in for followup on November 21, and was  complaining of chest discomfort.  She was beginning to use more  nitroglycerin with quick response.  She described it as a tightness  sensation.  She had an associated cough as well.  At that time Protonix  and Toprol were reinitiated.  She was set up for repeat two day  adenosine Cardiolite study which was performed on November 25.  Anterior  ischemia could not be excluded and may in fact be related to breast  attenuation.  However, the defect is in the area where previously stent  was placed.  She is now referred for diagnostic cardiac catheterization.   PAST MEDICAL HISTORY:  1. Known atherosclerotic cardiovascular disease.  She had previous      catheterization in January 2007 with subsequent stenting with 3 x      18 mm Cypher stent to the mid LAD.  The left main was normal.  The      left circumflex had minor irregularities, and the right coronary      was essentially normal as  well.  2. Anxiety/depression.  3. Recent suicide attempt.  4. Hypertensive heart disease.  5. Hyperlipidemia.  6. Previous single level ACF fusion and plating by Dr. Tressie Stalker      in May 2008 for cervical spondylosis.  7. History of breast implants.  8. Previous tubal ligation.  9. Tonsillectomy and adenoidectomy.  10.Childbirth x3.   ALLERGIES:  PREDNISONE causes depression.   CURRENT MEDICATIONS:  1. Toprol XL 50 mg a day.  2. Diovan 160 a day.  3. Protonix 40 mg a day.  4. Plavix 75 mg a day.  5. Prozac 40 mg a day.  6. Aspirin daily.  7. Oxycodone p.r.n.   FAMILY HISTORY:  Both of her parents are deceased.  Father died of a  suicide.  Mother died at 67 with a history of heart attack, diabetes,  and cancer.   SOCIAL HISTORY:  She is married.  Her husband  has multiple medical  problems.  She has no known tobacco use, but has been exposed to  significant secondary smoke.  She has no alcohol use.   REVIEW OF SYSTEMS:  Is as noted above.  She continues to deal with her  bouts of depression.  She is currently under the care of a psychiatrist.  She is back at work.  She has been coughing.  She has not been  exercising on a routine basis.  She has had no recent fever, flu or  upper respiratory illnesses.  She has had no frank syncope.  Her chest  discomfort is described as a tightness and is mostly exertional in  nature.  She has had no peripheral edema.   PHYSICAL EXAMINATION:  GENERAL:  She is somewhat depressed.  She is in  no acute distress.  VITAL SIGNS:  Weight 157-1/2 pounds, blood pressure 132/88 sitting,  150/100 standing, heart rate 88 and regular, respirations 18, afebrile.  SKIN:  Warm and dry, color unremarkable.  LUNGS:  Clear.  HEART:  Shows regular rhythm.  ABDOMEN:  Obese.  EXTREMITIES:  Without edema.  NEUROLOGIC:  Shows no gross focal deficits.   PERTINENT LABORATORIES:  BUN 24, creatinine 0.8, glucose 109.  CBC  showed hemoglobin 11,  hematocrit 35.  PT/PTT were unremarkable.   OVERALL IMPRESSION:  1. Chest pain with abnormal adenosine Cardiolite study.  2. Previous stent placement to the mid LAD in January 2007.  3. Anxiety/depression.  4. Recent suicide attempt.  5. Hypertension.  6. Hyperlipidemia.   PLAN:  Will proceed on with diagnostic catheterization.  Procedure,  risks, and benefits have all been explained, and she is willing to  proceed on Wednesday, June 17, 2007.      Sharlee Blew, N.P.      Colleen Can. Deborah Chalk, M.D.  Electronically Signed    LC/MEDQ  D:  06/16/2007  T:  06/16/2007  Job:  [id number]

## 2010-11-27 NOTE — Cardiovascular Report (Signed)
NAME:  Destiny Ballard, Destiny Ballard              ACCOUNT NO.:  [id number]   MEDICAL RECORD NO.:  [id number]          PATIENT TYPE:  OIB   LOCATION:  1962                         FACILITY:  [facility]   PHYSICIAN:  Colleen Can. Deborah Chalk, M.D.DATE OF BIRTH:  October 06, 1950   DATE OF PROCEDURE:  06/17/2007  DATE OF DISCHARGE:  06/17/2007                            CARDIAC CATHETERIZATION   PROCEDURE:  Left heart catheterization with selective coronary  angiography and left ventricular angiography.   TYPE AND SITE OF ENTRY:  Percutaneous right femoral artery.   CATHETERS:  4-French forward curved Judkins left coronary catheter, 4-  J[place] Williams right catheter, 4-French pigtail ventriculographic  catheter.   CONTRAST:  Pure Omnipaque.   MEDICATIONS:  Given prior procedure, Valium 10 mg p.o.   MEDICATIONS:  Given during the procedure, Versed 5 mg IV.   COMMENTS:  The patient tolerated the procedure well.   HEMODYNAMIC DATA:  The aortic pressure was 123/64, LV is 134/8-15.  There is no aortic valve gradient noted on pullback.   ANGIOGRAPHIC DATA:  1. Left main coronary artery is normal.  2. Left anterior descending had irregularities.  The stent was widely      patent in the midportion of the left anterior descending.  3. Left circumflex was tortuous with irregularities but no obstructive      disease.  4. Right coronary artery.  The right coronary artery had mild coronary      atherosclerosis.  It was essentially normal.   Left ventricular angiogram was performed in the RAO position.  Overall  cardiac size and silhouette are normal with global ejection fraction  estimated to be 60%.   OVERALL IMPRESSION:  1. Normal left ventricular function.  2. Persistent patency of the stent in the left anterior descending      coronary with minor irregularities otherwise.   DISCUSSION:  At this point time, will continue to modify cardiovascular  risk factors.  It is felt that the anterior defect by stress  Cardiolite  study was related more to artifact, obesity and breast attenuation.      Colleen Can. Deborah Chalk, M.D.  Electronically Signed     SNT/MEDQ  D:  06/17/2007  T:  06/17/2007  Job:  [id number]

## 2010-11-30 NOTE — Op Note (Signed)
[place]. [facility]  Patient:    Destiny Ballard, Destiny Ballard Visit Number: [id number] MRN: [id number]          Service Type: DSU Location: [facility] Attending Physician:  Twana First Dictated by:   Elana Alm Thurston Hole, M.D. Proc. 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  PROCEDURE:  Right shoulder EUA followed by manipulation under anesthesia and cortisone injection.  SURGEON:  Elana Alm. Thurston Hole, M.D.  ANESTHESIA:  General.  OPERATIVE TIME:  10 minutes.  COMPLICATIONS:  None.  INDICATIONS FOR PROCEDURE:  [name] is a 60 year old woman who is three months post right shoulder rotator cuff repair with significant postoperative arthrofibrosis who has failed conservative care and is now to undergo EUA, manipulation, and injection.  DESCRIPTION:  [name] was brought to the operating room on August 10, 2001, placed on the operative table in a supine position.  After an adequate level of general anesthesia was obtained, her right shoulder was examined under anesthesia.  Initial range of motion with forward flexion to 110, abduction to 100, internal rotation/external rotation of 40 degrees.  The shoulder was sterilely injected with 80 mg of Depo-Medrol and 12 cc of Marcaine - half of this in the subacromial space and half into the joint under sterile conditions.  The shoulder was then manipulated, breaking up soft adhesions and improving flexion to 175, abduction to 170, internal rotation of 80, external rotation of 90.  Shoulder remained stable to ligamentous exam. She was then awakened and taken to recovery room in stable condition.  FOLLOW-UP CARE:  She will be treated as an outpatient on Vicodin and Celebrex with early  aggressive physical therapy.  See her back in the office in a week for recheck and follow-up. Dictated by:   Elana Alm Thurston Hole, M.D. Attending Physician:  Twana First DD:  08/10/01 TD:  08/10/01 Job: [id number]/[id number]

## 2010-11-30 NOTE — H&P (Signed)
[place]. [facility]  Patient:    Destiny Ballard, SELF Visit Number: [id number] MRN: [id number]          Service Type: MED Location: [phone] Attending Physician:  Donnal Moat Dictated by:   Aundra Dubin, M.D. Admit Date:  06/01/2001   CC:         Ammie Dalton, M.D.  Robert A. Thurston Hole, M.D.   History and Physical  CHIEF COMPLAINT:  Shortness of breath.  HISTORY:  Ms. Markov is a 60 year old white female who just had right rotator cuff arthroscopic surgery on Wednesday, November 13.  Prior to the surgery she was given an injection of some sort to the site of her neck.  At that point she feels that she was needing to cough, but could not, and her throat was closing up.  During the surgery she was short of breath and was placed on O2.  She did not spend the night after the surgery.  Since this time while lying down she has some shortness of breath.  She worsens with standing up and walking and states that she just cannot get her breath.  There has been no fever, rashes, or headaches.  There has been little cough or productive sputum.  She has ached in her ankles for four of five days since the surgery but they have not been swollen.  There has been no swollen joints at all. There is no back pain and she has not had nausea, vomiting, diarrhea.  I spoke with Dr. Ethelda Chick about her initial care.  Because of the surgery, I was concerned that she had had a PE, but has now had a CT scan which is negative for this.  The chest x-ray shows left-sided pleural effusion with slight congestion to the base on the right.  The CT scan report discusses pneumonia versus atelectasis.  She was given 40 mg of IV Lasix and has urinated three, if not four times over the last four hours.  Her breathing is no better.  PAST MEDICAL HISTORY/PAST SURGICAL HISTORY:  Reflux and stomach problems, breast implants, tubal ligation, hypertension, recent right  shoulder arthroscopy.  No report of hysterectomy but she is only on Premarin.  MEDICATIONS: 1. Prevacid 30 mg q.d. 2. Percocet p.r.n. for pain. 3. Vioxx 25 mg q.d. 4. Toprol XL 25 mg q.d. 5. Premarin 0.9 mg q.d. 6. Voltaren p.r.n. 7. Allegra 180 mg p.r.n.  ALLERGIES:  Intolerances:  PREDNISONE.  SOCIAL HISTORY:  She is here in the emergency room with her second husband. She has three daughters from a prior marriage.  There are no children at home. 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  She has never smoked and does not drink alcohol.  FAMILY HISTORY:  Not obtained.  PHYSICAL EXAMINATION  VITAL SIGNS:  Temperature 99.3 degrees, blood pressure 162/112, pulse 94, respirations 22.  GENERAL:  She is lying on a stretcher and is in no distress and gives a clear, coherent history.  SKIN:  Dry, clear.  HEENT:  PERRL/EOMI.  Mouth:  Clear.  NECK:  Negative JVD with slight upper cervical adenopathy.  LUNGS:  ______ rales to the left base and flank, clear on the right and clear above on the left.  HEART:  Regular, no murmur.  ABDOMEN:  Negative HSM.  Nontender.  MUSCULOSKELETAL:  Hands and wrists have a good range of motion and show no synovitis.  The  right arm is in a sling.  I did not further examine this extremity.  The left shoulder moves easily.  Back is nontender.  Knees, ankles, and feet are cool and nonswollen.  There is no lower extremity edema.  NEUROLOGIC:  Nonfocal.  LABORATORIES:  EKG:  Normal sinus rhythm.  The automated printout suggests possible inferior infarct but I would disagree with this.  Chest x-ray shows a left lingular infiltrate.  WBC 8.4, hemoglobin 11.5, MCV 77, platelets 292,000.  Sodium 139, BUN 12, creatinine 0.7, calcium 9.1.  CK total 74, CK-MB 1.1.  ASSESSMENT AND PLAN: 1. Suspected pneumonia.  I will place her on Levaquin 400 mg intravenous    q.24h.  This is an uncomplicated  hospitalization.  I gave consideration of    not admitting her but with the shortness of breath and the density on the    chest x-ray that she needs admission.  Her saturations remain 94 and she is    no requiring oxygen at this time.  We will give her oxygen p.r.n.  She    seems to be comfortable at this time. 2. Hypertension.  She is back on her medicines and has also been given Lasix.    We will follow this. 3. Recent right shoulder arthroscopy.  She will be given Percocet for pain.    Encouraged to ambulate in the room as needed. Dictated by:   Aundra Dubin, M.D. Attending Physician:  Donnal Moat DD:  06/01/01 TD:  06/02/01 Job: [id number]

## 2010-11-30 NOTE — Discharge Summary (Signed)
[place]. [facility]  Patient:    Destiny Ballard, Destiny Ballard Visit Number: [id number] MRN: [id number]          Service Type: MED Location: [phone] Attending Physician:  Donnal Moat Dictated by:   Aundra Dubin, M.D. Admit Date:  06/01/2001 Disc. Date: 06/02/01   CC:         Ammie Dalton, M.D.   Discharge Summary  CHIEF COMPLAINT:  Shortness of breath.  PROCEDURES:  None.  HISTORY OF PRESENT ILLNESS:  Destiny Ballard is a 60 year old woman who underwent a right shoulder arthroscopy on May 27, 2001.  After this, she developed shortness of breath with dyspnea on exertion.  She was having some mild chest pain.  She was not having fever or cough.  On admission, a chest x-ray showed a left lower lobe infiltrate with pleural effusion.  She underwent a CT scan which showed no PE.  She was given Lasix and did not respond to this, and I felt this was more a pneumonia to the left lower lobe and was admitted for IV antibiotics.  PAST MEDICAL HISTORY, MEDICATIONS, SOCIAL HISTORY, FAMILY HISTORY:  Please refer to the H&P.  PHYSICAL EXAMINATION:  (On June 02, 2001, at 6;30)  VITAL SIGNS:  Blood pressure 132/78, respirations 18, pulse 76.  GENERAL:  She gives a good history and is not short of breath.  LUNGS:  Trace crackle to the left base which is improved from the morning exam.  There is negative JVD.  SKIN:  Clear.  HEART:  Regular, no murmur.  ABDOMEN:  Negative hepatosplenomegaly, nontender.  EXTREMITIES:  There are no swollen joints.  There is no lower extremity edema.  HOSPITAL COURSE AND ASSESSMENT/PLAN:  #1 - This was an uncomplicated community-acquired pneumonia, and she has had two doses of Tequin.  I was planning to discharge her on the morning of June 03, 2001, but I dropper by to see her.  She has been able to ambulate in the halls without shortness of breath or difficulty.  There is no chest pain, and she is  doing quite well.  She states that she is feeling better. She has not required oxygen during the hospitalization.  She had a normal white count at 8.6.  I believe she is entirely safe to go home at this point if she will be with her husband during the evening.  She will be placed on Levoquin 500 mg 1 q.d. for 5 days.  #2 - RIGHT SHOULDER SURGERY:  She will follow up with Dr. Thurston Hole as planned.  #3 - HISTORY OF HYPERTENSION:  She will resume all medications as prior to this admission that she normally takes.  ACTIVITY:   She will do no strenuous activity until she is feeling better. She can be up and about in the home.  DIET:  No restrictions.  FOLLOWUP:  I have asked her to speak with Dr. Theda Belfast office over this week so that she can be seen in about 7 to 10 days. Dictated by:   Aundra Dubin, M.D. Attending Physician:  Donnal Moat DD:  06/02/01 TD:  06/02/01 Job: [id number]

## 2010-11-30 NOTE — Op Note (Signed)
NAME:  Destiny Ballard, Destiny Ballard              ACCOUNT NO.:  [id number]   MEDICAL RECORD NO.:  [id number]          PATIENT TYPE:  AMB   LOCATION:  SDS                          FACILITY:  [facility]   PHYSICIAN:  Cristi Loron, M.D.DATE OF BIRTH:  04-Feb-1951   DATE OF PROCEDURE:  11/17/2006  DATE OF DISCHARGE:                               OPERATIVE REPORT   BRIEF HISTORY:  The patient is a 60 year old white female who suffered  from chronic neck pain.  She failed medical management.  Worked up with  a cervical MRI which demonstrated the patient had a multilevel  spondylosis but had the most significant narrowing at C4-5.  She failed  nonsurgical management and, therefore, discussed the C4-5 anterior  cervical diskectomy and fusion plating with her.  The patient has  weighed the risks, benefits, and alternatives to surgery and consented  to proceed with the C4-5 anterior cervical diskectomy and fusion plating  and placement interbody prosthesis.   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   POSTOPERATIVE DIAGNOSIS:  At C4-5 as degenerative disease, spondylosis  stenosis, cervical radiculopathy, cervicalgia.   OPERATION/PROCEDURE:  C4-5 extensive anterior cervical diskectomy/slash  decompression; C4-5 anterior interbody local autograft arthrodesis;  insertion of C4-5 interbody prosthesis (Alphatec PEEK interbody  prosthesis); anterior cervical instrumentation, C4-5 with Codman  SlimLock titanium plate and screws.   SURGEON:  Cristi Loron, M.D.   ASSISTANT:  Hewitt Shorts, M.D.   ANESTHESIA:  General endotracheal.   ESTIMATED BLOOD LOSS:  100 mL.   SPECIMENS:  None.   DRAINS:  None.   COMPLICATIONS:  None.   DESCRIPTION OF PROCEDURE:  The patient was brought to the operating room  by the anesthesia team.  General endotracheal anesthesia was induced.  The patient remained in the supine position.  A  roll was placed under  her shoulders, placing neck in slight extension.  Cervical region was  then prepared with Betadine scrub and Betadine solution.  Sterile drapes  were applied.  I then injected area to be incised with Marcaine with  epinephrine solution and used a scalpel to make a transverse incision in  the patient's left anterior neck.  I used the Metzenbaum scissors to  divide the platysma muscle and then to dissect medial to  sternocleidomastoid muscle, jugular vein and carotid artery.  I  carefully dissected down towards the anterior cervical spine,  identifying the esophagus, and retracted medially.  We then used Kitner  swabs to clear soft tissue from the anterior cervical spine and then  inserted a bent spinal needle into the upper exposed intervertebral disk  space.  We obtained intraoperative radiograph to confirm our location.   I then used electrocautery to detach the medial border of the longus  colli muscle bilaterally from C4-5 intervertebral disk space.  We then  inserted the Caspar self-retaining retractor underneath the longus colli  muscle bilaterally to provide exposure.  We then incised C4-5  intervertebral disk with the 15-blade scalpel, performed a partial  intervertebral diskectomy with the pituitary forceps.  I then inserted  dissectors to  C4-C5, distracted interspace.  We then brought the  operative microscope into the field and under image magnification and  illumination completed the microdissections/decompression, used a high-  speed drill to decorticate the vertebral end plates of Z6-1, drill away  the remaining C4-5  disk and to drill away some posterior spondylosis  and to thin out the posterior longitudinal ligament.  We then incised  the thinned-out ligament with arachnoid knife and removed it with the  Kerrison punch, undercutting the vertebral end plates at W9-6,  decompressing the thecal sac and then performed foraminotomies about the  bilateral  C5 nerve roots, removed the spondylosis from the neural  foramen, completing the decompression.   We now turned our attention to the arthrodesis.  We used trial spacers  and determined to use a 5-mm small Alphatec PEEK interbody prosthesis.  We prefilled the prosthesis with a combination of local autograft bone  we obtained during the decompression as well as Vitoss bone graft  extender.  We then inserted the prosthesis to distract C4-5 interspace.  I then removed distraction screws.  There was a good snug fit of the  prosthesis in the interspace of C4-5.   We completed the surgical prosthesis and arthrodesis.  We now turned our  attention to the anterior spinal instrumentation.  I used a high-speed  drill to remove some ventral spondylosis from C4-5 interspace so that  the plate lay down flat.  We selected appropriate length Codman SlimLock  titanium plate and laid it along the anterior aspect of vertebral bodies  at C4-C5.  We then drilled to 12 mm holes at C4-C5 and secured the plate  to the vertebral bodies by placing two 12 mm self-tapping screws at C4,  two at C5, and then obtained intraoperative radiograph which  demonstrated good position of the upper plate screws and the prosthesis.  We had limited visualization of the lower plate screws because of the  patient's body habitus, but they looked good in vivo.  We, therefore,  secured the screws to the plate by locking each cam completing the  instrumentation.   We then obtained hemostasis using bipolar electrocautery. We irrigated  the wound out with bacitracin solution.  We then removed the retractor  and then inspected esophagus for damage.  There was none apparent.  We  then reapproximated the patient's platysma muscle with interrupted 3-0  Vicryl suture, subcutaneous tissue with interrupted 3-0 Vicryl suture and skin with Steri-Strips and Benzoin.  The wound was then coated with  bacitracin ointment and sterile dressing was  applied.  The drapes were  removed.  The patient was subsequently extubated by the anesthesia team  and transported to post anesthesia care unit in stable condition.  All  sponge, instrument and needle counts correct at the case.      Cristi Loron, M.D.  Electronically Signed     JDJ/MEDQ  D:  11/17/2006  T:  11/18/2006  Job:  [id number]

## 2010-11-30 NOTE — Cardiovascular Report (Signed)
NAME:  Destiny Ballard, Destiny Ballard              ACCOUNT NO.:  [id number]   MEDICAL RECORD NO.:  [id number]          PATIENT TYPE:  INP   LOCATION:  6526                         FACILITY:  [facility]   PHYSICIAN:  Colleen Can. Deborah Chalk, M.D.DATE OF BIRTH:  [birth year]   DATE OF PROCEDURE:  07/24/2005  DATE OF DISCHARGE:                              CARDIAC CATHETERIZATION   INDICATIONS FOR PROCEDURE:  Acute onset of chest pain.   PROCEDURE:  Left heart catheterization with selective coronary angiography  and left ventricular angiography with stent placement in mid left anterior  descending.   TYPE AND SITE OF ENTRY:  Percutaneous right femoral artery with AngioSeal.   CATHETER:  A 6 French 4 curved Judkins right and left coronary catheter, 6  French pigtail ventriculography catheter, 6 [place] JL-4 guide, Prowater  guidewire, 2.5 x 15 mm Maverick balloon and subsequent 3 x 18 mm CYPHER  stent.   MEDICATIONS GIVEN PRIOR TO PROCEDURE:  Valium, heparin and Integrilin.   MEDICATIONS GIVEN DURING THE PROCEDURE:  Versed and Fentanyl.   COMMENTS:  The patient tolerated the procedure well.   HEMODYNAMIC DATA:  The aortic pressure was 177/96, LV pressure was 134/18.  There was no aortic valve gradient noted on pullback.   ANGIOGRAPHIC DATA:  1.  Left main coronary artery is normal.  2.  The left anterior descending has a severe 90% stenosis in an eccentric      manner in the mid portion.  It is between a large septal perforating      branch and second diagonal vessel.  There are minor scattered      irregularities distally.  3.  Left circumflex:  The left circumflex is of moderate size.  It has minor      irregularities.  4.  Right coronary artery:  The right coronary artery is a very large      dominant vessel.  It is essentially normal with only minor      irregularities.   LEFT VENTRICULAR ANGIOGRAM:  Left ventricular angiogram is performed in the  RAO position.  The overall cardiac size and  silhouette are normal. The  global ejection fraction is estimated to be 60 to 70%.   ANGIOPLASTY PROCEDURE:  We changed catheters for the JL-4 guide and Prowater  guidewire was easily passed across the lesion.  We predilated with a 2.5 x  15 mm Maverick balloon and then returned with a 3 x 18 mm CYPHER stent.  This covered the entire area between the septal perforating branch and the  second diagonal vessel.  It was expanded to 16 atmospheres as maximum and  with this, follow-up angiographic data showed satisfactory flow with no  residual stenosis.   DISCUSSION:  At that point in time, we elected to proceed with AngioSeal.  She had groin pain during the procedure but once AngioSeal was in place,  this resolved.      Colleen Can. Deborah Chalk, M.D.  Electronically Signed     SNT/MEDQ  D:  07/24/2005  T:  07/25/2005  Job:  [id number]   cc:   Jonita Albee, M.D.  Fax:  [phone] 

## 2010-11-30 NOTE — H&P (Signed)
NAME:  BENEDETTA, SUNDSTROM              ACCOUNT NO.:  [id number]   MEDICAL RECORD NO.:  [id number]          PATIENT TYPE:  INP   LOCATION:  1826                         FACILITY:  [facility]   PHYSICIAN:  Colleen Can. Deborah Chalk, M.D.DATE OF BIRTH:  May 23, 1951   DATE OF ADMISSION:  07/24/2005  DATE OF DISCHARGE:                                HISTORY & PHYSICAL   CHIEF COMPLAINT:  Chest pain.   HISTORY OF PRESENT ILLNESS:  This is a very pleasant 60 year old female.  She has had a longstanding history of palpitations and obesity as well as  depression.  She presents to the emergency room __________ with atypical  chest pain.  She notes that she saw Dr. Perrin Maltese at the urgent care yesterday,  primarily was there for knee pain due to a previous fall that dates back to  2005.  She does admit that she has been under significant amount of stress  in regards to her family and work life.  She notes that after she returned  home last night, she had a restless night.  She really was not able to  sleep.  She felt somewhat nauseated this morning on her way to work but did  try to eat.  She has also complained of palpitations which she describes as  flutters.  She was seen back at urgent care, after she noted that her left  arm felt heavy and numb.  She also had complaint of headache.  She was  subsequently brought here for further evaluation.   PAST MEDICAL HISTORY:  1.  GERD.  2.  Palpations.  3.  Anxiety.  4.  Depression.  5.  Bilateral breast implants.  6.  Status post tubal ligation in 1962.  7.  History of tonsillectomy.   ALLERGIES:  PREDNISONE causes depression.   CURRENT MEDICINES:  She is on Prevacid, Claritin D.  She has been on Toprol,  hormones for quite some time.   FAMILY HISTORY:  Father died from suicide, age unknown.  Mother died at 81  of heart attack.   SOCIAL HISTORY:  She is married.  Her spouse has significant co-morbidities.  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  She has no tobacco  products, rare alcohol use.   REVIEW OF SYSTEMS:  She reports lots of stress.  She has had multiple  episodes of crying.  She is not able to sleep.  She remains obese, not  really exercising but does walk a considerable amount with her mail route.  She has had no real exertion symptoms except for some shortness of breath  which she attributes to her weight.  She has had complaints of ankle edema  but does note significant sodium indiscretion.   PHYSICAL EXAMINATION:  GENERAL:  She is very tearful, crying.  VITAL SIGNS:  Blood pressure is 187/91, heart rate 75, respirations 18,  afebrile.  SKIN:  Warm and dry.  Color is unremarkable.  LUNGS:  Clear.  HEART:  Shows a regular rhythm yet with distant heart tones.  ABDOMEN:  Obese.  EXTREMITIES:  Without edema.  NEUROLOGIC:  No focal deficits.  PERTINENT LABORATORY:  CK and troponin are negative.  Chemistries are normal  except for a glucose mildly elevated at 106.  CBC shows hemoglobin 11,  hematocrit 34.   EKG shows a sinus rhythm.  There are no acute changes.   OVERALL IMPRESSION:  1.  Atypical chest pain.  2.  Palpitations.  3.  Significant situational stress.   PLAN:  1.  She will be admitted to the service of Dr. Roger Shelter.  2.  She has already been placed on IV nitro and heparin.  3.  She has been given aspirin here in the emergency room.  4.  Her blood pressure will need to be treated accordingly.  5.  We may proceed on with catheterization versus outpatient stress testing.      Sharlee Blew, N.P.      Colleen Can. Deborah Chalk, M.D.  Electronically Signed    LC/MEDQ  D:  07/24/2005  T:  07/24/2005  Job:  [id number]   cc:   Jonita Albee, M.D.  Fax: [phone]

## 2010-11-30 NOTE — Discharge Summary (Signed)
NAME:  Destiny Ballard, Destiny Ballard              ACCOUNT NO.:  [id number]   MEDICAL RECORD NO.:  [id number]          PATIENT TYPE:  INP   LOCATION:  6526                         FACILITY:  [facility]   PHYSICIAN:  Colleen Can. Deborah Chalk, M.D.DATE OF BIRTH:  [birth year]   DATE OF ADMISSION:  07/24/2005  DATE OF DISCHARGE:  07/25/2005                                 DISCHARGE SUMMARY   PRIMARY DISCHARGE DIAGNOSIS:  Atypical chest pain with subsequent elective  cardiac catheterization with stent placement to the left anterior descending  with a 3.0 x 18-mm Cypher stent.   SECONDARY DIAGNOSES:  1.  Hypertension, now started on beta-blocker therapy.  2.  Situational stress.   HISTORY OF PRESENT ILLNESS:  The patient is a 60 year old female.  She  presents to the emergency room with an episode of atypical chest pain that  was associated with left arm heaviness and numbness.  She has been under a  significant amount of situational stress.  She was subsequently referred for  catheterization.   Please see the history and physical as dictated for further patient  presentation and profile.   ADMISSION LABORATORY DATA:  EKG showed no acute changes.  CBC showed a  hemoglobin of 11, hematocrit 34.  Chemistries were normal.  CK and troponin  were negative.   HOSPITAL COURSE:  The patient was admitted electively.  She was taken to the  cardiac catheterization laboratory where she underwent coronary angiography  per Dr. Roger Shelter.  That procedure was tolerated well without any  known complications.  LV function was normal, ejection fraction 60-70%.  The  right coronary is dominant, with luminal irregularities.  The left main is  normal.  The LAD has a 95% eccentric stenosis in the midportion.  The left  circumflex has irregularities.  Subsequently stent placement was performed,  with an overall satisfactory result obtained.   Post-procedure, she was transferred to 6500.  Plans were now made for her to  be  discharged in the morning if cardiac enzymes are negative and she is  stable from a physical standpoint.   CONDITION ON DISCHARGE:  Stable.   DISCHARGE MEDICATIONS:  1.  Plavix 75 mg daily.  2.  Aspirin daily.  3.  Lipitor 10 mg daily.  4.  Toprol-XL 50 mg daily.  5.  Nitroglycerin p.r.n.   FOLLOW UP:  We will see her back in our office in approximately 10 days.   DISCHARGE INSTRUCTIONS:  She is not to return to work until Monday, July 29, 2005.   DIET:  Heart-healthy.   ACTIVITY:  Increased slowly.      Sharlee Blew, N.P.      Colleen Can. Deborah Chalk, M.D.  Electronically Signed    LC/MEDQ  D:  07/24/2005  T:  07/25/2005  Job:  [id number]   cc:   Jonita Albee, M.D.  Fax: [phone]

## 2011-01-11 ENCOUNTER — Other Ambulatory Visit: Payer: Self-pay | Admitting: Obstetrics and Gynecology

## 2011-01-11 ENCOUNTER — Encounter: Payer: Self-pay | Admitting: Nurse Practitioner

## 2011-01-17 ENCOUNTER — Other Ambulatory Visit: Payer: Self-pay | Admitting: Obstetrics and Gynecology

## 2011-01-17 DIAGNOSIS — M8580 Other specified disorders of bone density and structure, unspecified site: Secondary | ICD-10-CM

## 2011-01-17 DIAGNOSIS — Z1231 Encounter for screening mammogram for malignant neoplasm of breast: Secondary | ICD-10-CM

## 2011-01-18 ENCOUNTER — Other Ambulatory Visit: Payer: Self-pay | Admitting: *Deleted

## 2011-01-18 DIAGNOSIS — E785 Hyperlipidemia, unspecified: Secondary | ICD-10-CM

## 2011-01-21 ENCOUNTER — Encounter: Payer: Self-pay | Admitting: Nurse Practitioner

## 2011-01-21 ENCOUNTER — Ambulatory Visit (INDEPENDENT_AMBULATORY_CARE_PROVIDER_SITE_OTHER): Payer: No Typology Code available for payment source | Admitting: Nurse Practitioner

## 2011-01-21 ENCOUNTER — Other Ambulatory Visit (INDEPENDENT_AMBULATORY_CARE_PROVIDER_SITE_OTHER): Payer: No Typology Code available for payment source | Admitting: *Deleted

## 2011-01-21 ENCOUNTER — Other Ambulatory Visit: Payer: Self-pay | Admitting: Nurse Practitioner

## 2011-01-21 DIAGNOSIS — E785 Hyperlipidemia, unspecified: Secondary | ICD-10-CM

## 2011-01-21 DIAGNOSIS — R079 Chest pain, unspecified: Secondary | ICD-10-CM

## 2011-01-21 DIAGNOSIS — I10 Essential (primary) hypertension: Secondary | ICD-10-CM

## 2011-01-21 DIAGNOSIS — I2510 Atherosclerotic heart disease of native coronary artery without angina pectoris: Secondary | ICD-10-CM

## 2011-01-21 LAB — HEPATIC FUNCTION PANEL
ALT: 25 U/L (ref 0–35)
AST: 28 U/L (ref 0–37)
Albumin: 4.3 g/dL (ref 3.5–5.2)
Alkaline Phosphatase: 76 U/L (ref 39–117)
Bilirubin, Direct: 0 mg/dL (ref 0.0–0.3)
Total Bilirubin: 0.3 mg/dL (ref 0.3–1.2)
Total Protein: 7.6 g/dL (ref 6.0–8.3)

## 2011-01-21 LAB — BASIC METABOLIC PANEL
BUN: 22 mg/dL (ref 6–23)
CO2: 26 mEq/L (ref 19–32)
Calcium: 9 mg/dL (ref 8.4–10.5)
Chloride: 104 mEq/L (ref 96–112)
Creatinine, Ser: 0.7 mg/dL (ref 0.4–1.2)
GFR: 94.9 mL/min (ref >60.00)
Glucose, Bld: 118 mg/dL — ABNORMAL HIGH (ref 70–99)
Potassium: 4 mEq/L (ref 3.5–5.1)
Sodium: 139 mEq/L (ref 135–145)

## 2011-01-21 LAB — LIPID PANEL
Cholesterol: 183 mg/dL (ref 0–200)
HDL: 57.5 mg/dL (ref >39.00)
Total CHOL/HDL Ratio: 3
Triglycerides: 252 mg/dL — ABNORMAL HIGH (ref 0.0–149.0)
VLDL: 50.4 mg/dL — ABNORMAL HIGH (ref 0.0–40.0)

## 2011-01-21 LAB — TSH: TSH: 0.81 u[IU]/mL (ref 0.35–5.50)

## 2011-01-21 LAB — LDL CHOLESTEROL, DIRECT: Direct LDL: 112.6 mg/dL

## 2011-01-21 MED ORDER — HYDROCHLOROTHIAZIDE 25 MG PO TABS: 25.0000 mg | ORAL_TABLET | Freq: Every day | ORAL | Status: DC

## 2011-01-21 NOTE — Patient Instructions (Signed)
Let's add back your fluid pill. Start HCTZ 25 mg daily. Take in the morning Continue with your other current medicines. Monitor your blood pressure at home.  Record your readings and bring to your next visit. Limit sodium intake. Call for any problems, especially if your chest pain gets any worse  I will see you in a month.

## 2011-01-21 NOTE — Assessment & Plan Note (Signed)
She has had remote stenting of the LAD in 2007 and had documented patency in 2008. Last stress test was in June of 2010. She has always had some chest discomfort as a general rule. No change in her current pattern. She wants to hold off on repeat stress testing for now. EKG is ok today.

## 2011-01-21 NOTE — Progress Notes (Signed)
Destiny Ballard Date of Birth: 1951-04-19   History of Present Illness: Destiny Ballard is seen back today for her 6 month check. She is seen for Dr. Shirlee Latch. She is a former patient of Dr. Ronnald Nian. She remains under a lot of stress with her family life. She is back at work. 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 The heat has been very hard for her. She will have some occasional chest pain that she attributes to stress. She does not use NTG. She is not on her ARB/HCT and does not know why. She has had more swelling. She does not check her blood pressure. Her weight is down about 6 pounds since she has been back to work.   Current Outpatient Prescriptions on File Prior to Visit  Medication Sig Dispense Refill  . albuterol (PROAIR HFA) 108 (90 BASE) MCG/ACT inhaler Inhale 2 puffs into the lungs every 6 (six) hours as needed.        [name] aspirin 81 MG tablet Take 81 mg by mouth daily.        . benzonatate (TESSALON) 100 MG capsule Take 100 mg by mouth 3 (three) times daily as needed.        . clopidogrel (PLAVIX) 75 MG tablet Take 75 mg by mouth every other day.        . diphenhydrAMINE (BENADRYL) 25 MG tablet Take 25 mg by mouth every 6 (six) hours as needed.        . fexofenadine (ALLEGRA) 180 MG tablet Take 180 mg by mouth daily.        [name] FLUoxetine (PROZAC) 40 MG capsule Take 40 mg by mouth daily.        . fluticasone (VERAMYST) 27.5 MCG/SPRAY nasal spray Place 2 sprays into the nose as needed.        . IRON PO Take by mouth daily.        . metoprolol (TOPROL-XL) 100 MG 24 hr tablet Take 100 mg by mouth daily.        . Multiple Vitamin (MULTIVITAMIN) tablet Take 1 tablet by mouth daily.        . rosuvastatin (CRESTOR) 20 MG tablet Take 20 mg by mouth daily.        . vitamin B-12 (CYANOCOBALAMIN) 1000 MCG tablet Take 1,000 mcg by mouth daily.        [name] VITAMIN E PO Take by mouth daily.        [name] DISCONTD: Calcium Carbonate-Vitamin D (CALCIUM + D PO) Take by mouth daily.        [name] DISCONTD:  valsartan-hydrochlorothiazide (DIOVAN-HCT) 320-25 MG per tablet Take 1 tablet by mouth daily.          Allergies  Allergen Reactions  . Prednisone     REACTION: mood swings, nightmares    Past Medical History  Diagnosis Date  . IHD (ischemic heart disease)   . Hypertension   . Hyperlipidemia   . Obesity   . Anxiety   . Depression     Past Surgical History  Procedure Date  . Cardiac catheterization 06/17/2007    NORMAL. EF 60%  . Cardiac catheterization 07/24/2005    NORMAL. EF 60-70%  . Coronary stent placement 07/2005    LEFT ANTERIOR DESCENDING  . Tubal ligation   . Tonsillectomy and adenoidectomy   . Childbirth     X3  . Breast enhancement surgery   . Cervical spondylosis     SINGLE LEVEL FUSION    History  Smoking  status  . Never Smoker   Smokeless tobacco  . Not on file    History  Alcohol Use No    Family History  Problem Relation Age of Onset  . Heart attack Mother   . Diabetes Mother   . Cancer Mother   . Suicidality Father     Review of Systems: The review of systems is positive for stress and fatigue.  All other systems were reviewed and are negative.  Physical Exam: BP 128/84  Pulse 65  Ht 5' (1.524 m)  Wt 162 lb 9.6 oz (73.755 kg)  BMI 31.76 kg/m2 Patient is very pleasant and in no acute distress. She is obese. Skin is warm and dry. Color is normal.  HEENT is unremarkable. Normocephalic/atraumatic. PERRL. Sclera are nonicteric. Neck is supple. No masses. No JVD. Lungs are clear. Cardiac exam shows a regular rate and rhythm. Abdomen is soft and obese. Extremities are with trace edema. Gait and ROM are intact. No gross neurologic deficits noted.  LABORATORY DATA:   EKG is normal today.  Assessment / Plan:

## 2011-01-21 NOTE — Assessment & Plan Note (Signed)
Blood pressure is ok. She is having more swelling. I do not know why she is off the DiovanHct. I have restarted her HCTZ at 25 mg. I will see her back in one month. She says she can monitor her blood pressure at home.

## 2011-01-21 NOTE — Assessment & Plan Note (Signed)
Labs are checked today.  

## 2011-01-22 ENCOUNTER — Telehealth: Payer: Self-pay | Admitting: Cardiology

## 2011-01-22 ENCOUNTER — Encounter: Payer: Self-pay | Admitting: Nurse Practitioner

## 2011-01-22 ENCOUNTER — Other Ambulatory Visit: Payer: Self-pay | Admitting: Cardiology

## 2011-01-22 DIAGNOSIS — I2510 Atherosclerotic heart disease of native coronary artery without angina pectoris: Secondary | ICD-10-CM

## 2011-01-22 DIAGNOSIS — E785 Hyperlipidemia, unspecified: Secondary | ICD-10-CM

## 2011-01-22 DIAGNOSIS — Z79899 Other long term (current) drug therapy: Secondary | ICD-10-CM

## 2011-01-22 NOTE — Telephone Encounter (Signed)
Message copied by Karle Plumber on Tue Jan 22, 2011  3:19 PM ------      Message from: Rosalio Macadamia      Created: Mon Jan 21, 2011  4:43 PM       Ok to report. Labs are satisfactory. Blood sugar remains up a little.  Stay on same medicines. Recheck BMET/HPF/LIPIDS  in 6 months. Needs to follow a diabetic diet and continue with weight loss.

## 2011-01-22 NOTE — Telephone Encounter (Signed)
Pt informed of lab results and to follow diabetic diet and will repeat labs in six months. Pt verbalizes understanding. Pt also monitoring blood pressure various times of the day and keeping a log for next visit.

## 2011-01-30 ENCOUNTER — Ambulatory Visit: Payer: No Typology Code available for payment source

## 2011-01-30 ENCOUNTER — Other Ambulatory Visit: Payer: No Typology Code available for payment source

## 2011-02-25 ENCOUNTER — Ambulatory Visit: Payer: No Typology Code available for payment source | Admitting: Nurse Practitioner

## 2011-02-25 ENCOUNTER — Other Ambulatory Visit: Payer: No Typology Code available for payment source | Admitting: *Deleted

## 2011-02-27 ENCOUNTER — Other Ambulatory Visit (INDEPENDENT_AMBULATORY_CARE_PROVIDER_SITE_OTHER): Payer: No Typology Code available for payment source | Admitting: *Deleted

## 2011-02-27 ENCOUNTER — Other Ambulatory Visit: Payer: Self-pay | Admitting: Nurse Practitioner

## 2011-02-27 ENCOUNTER — Encounter: Payer: Self-pay | Admitting: Nurse Practitioner

## 2011-02-27 ENCOUNTER — Ambulatory Visit (INDEPENDENT_AMBULATORY_CARE_PROVIDER_SITE_OTHER): Payer: No Typology Code available for payment source | Admitting: Nurse Practitioner

## 2011-02-27 VITALS — BP 154/88 | HR 78 | Ht 60.0 in | Wt 161.4 lb

## 2011-02-27 DIAGNOSIS — I10 Essential (primary) hypertension: Secondary | ICD-10-CM

## 2011-02-27 DIAGNOSIS — Z79899 Other long term (current) drug therapy: Secondary | ICD-10-CM

## 2011-02-27 DIAGNOSIS — I2510 Atherosclerotic heart disease of native coronary artery without angina pectoris: Secondary | ICD-10-CM

## 2011-02-27 DIAGNOSIS — E785 Hyperlipidemia, unspecified: Secondary | ICD-10-CM

## 2011-02-27 DIAGNOSIS — R079 Chest pain, unspecified: Secondary | ICD-10-CM

## 2011-02-27 LAB — HEPATIC FUNCTION PANEL
ALT: 25 U/L (ref 0–35)
AST: 27 U/L (ref 0–37)
Albumin: 4.1 g/dL (ref 3.5–5.2)
Alkaline Phosphatase: 81 U/L (ref 39–117)
Bilirubin, Direct: 0 mg/dL (ref 0.0–0.3)
Total Bilirubin: 0.4 mg/dL (ref 0.3–1.2)
Total Protein: 7.8 g/dL (ref 6.0–8.3)

## 2011-02-27 LAB — BASIC METABOLIC PANEL
BUN: 28 mg/dL — ABNORMAL HIGH (ref 6–23)
CO2: 35 mEq/L — ABNORMAL HIGH (ref 19–32)
Calcium: 9.5 mg/dL (ref 8.4–10.5)
Chloride: 96 mEq/L (ref 96–112)
Creatinine, Ser: 0.8 mg/dL (ref 0.4–1.2)
GFR: 74.09 mL/min (ref >60.00)
Glucose, Bld: 142 mg/dL — ABNORMAL HIGH (ref 70–99)
Potassium: 3.8 mEq/L (ref 3.5–5.1)
Sodium: 139 mEq/L (ref 135–145)

## 2011-02-27 LAB — LIPID PANEL
Cholesterol: 163 mg/dL (ref 0–200)
HDL: 52.7 mg/dL (ref >39.00)
Total CHOL/HDL Ratio: 3
Triglycerides: 232 mg/dL — ABNORMAL HIGH (ref 0.0–149.0)
VLDL: 46.4 mg/dL — ABNORMAL HIGH (ref 0.0–40.0)

## 2011-02-27 LAB — LDL CHOLESTEROL, DIRECT: Direct LDL: 91.1 mg/dL

## 2011-02-27 MED ORDER — CLOPIDOGREL BISULFATE 75 MG PO TABS: 75.0000 mg | ORAL_TABLET | ORAL | Status: DC

## 2011-02-27 MED ORDER — VALSARTAN-HYDROCHLOROTHIAZIDE 320-25 MG PO TABS: 1.0000 | ORAL_TABLET | Freq: Every day | ORAL | Status: DC

## 2011-02-27 NOTE — Progress Notes (Signed)
Destiny Ballard Date of Birth: [birth year]   History of Present Illness: Destiny Ballard is seen back today for a one month check. She is seen for Dr. Shirlee Latch. I restarted her HCTZ for some swelling last month. For some unknown reason she had stopped her Diovan Hct. Her blood pressure at last visit was ok. Now it is up. It is high at home. She continues to have some vague chest pain. She has known CAD with prior PCI. Last stress test was 2 years ago. She remains on chronic Plavix. She remains under lots of stress with family issues. She tries to cope the best she can.   Current Outpatient Prescriptions on File Prior to Visit  Medication Sig Dispense Refill  . albuterol (PROAIR HFA) 108 (90 BASE) MCG/ACT inhaler Inhale 2 puffs into the lungs every 6 (six) hours as needed.        [name] aspirin 81 MG tablet Take 81 mg by mouth daily.        . Cholecalciferol (VITAMIN D3) 1000 UNITS CAPS Take by mouth daily.        . diphenhydrAMINE (BENADRYL) 25 MG tablet Take 25 mg by mouth every 6 (six) hours as needed.        . fexofenadine (ALLEGRA) 180 MG tablet Take 180 mg by mouth daily.        [name] FLUoxetine (PROZAC) 40 MG capsule Take 40 mg by mouth daily.        . IRON PO Take by mouth daily.        . metoprolol (TOPROL-XL) 100 MG 24 hr tablet Take 100 mg by mouth daily.        . rosuvastatin (CRESTOR) 20 MG tablet Take 20 mg by mouth daily.        . vitamin B-12 (CYANOCOBALAMIN) 1000 MCG tablet Take 1,000 mcg by mouth daily.        [name] VITAMIN E PO Take by mouth daily.        [name] DISCONTD: clopidogrel (PLAVIX) 75 MG tablet Take 75 mg by mouth every other day.          Allergies  Allergen Reactions  . Prednisone     REACTION: mood swings, nightmares    Past Medical History  Diagnosis Date  . IHD (ischemic heart disease)     Prior stent to LAD in 2007  . Hypertension   . Hyperlipidemia   . Obesity   . Anxiety     Prior suicide attempt  . Depression   . S/P cardiac catheterization 2008    Stent  patent  . Normal nuclear stress test 2010  . Cervical spondylosis     Past Surgical History  Procedure Date  . Cardiac catheterization 06/17/2007    NORMAL. EF 60%  . Cardiac catheterization 07/24/2005    NORMAL. EF 60-70%  . Coronary stent placement 07/2005    LEFT ANTERIOR DESCENDING  . Tubal ligation   . Tonsillectomy and adenoidectomy   . Childbirth     X3  . Breast enhancement surgery   . Cervical spondylosis     SINGLE LEVEL FUSION    History  Smoking status  . Never Smoker   Smokeless tobacco  . Never Used    History  Alcohol Use No    Family History  Problem Relation Age of Onset  . Heart attack Mother   . Diabetes Mother   . Cancer Mother   . Suicidality Father     Review of Systems:  The review of systems is positive for chest pain. 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 She does not exercise regularly. Blood pressure is high at home. She is tolerating her medicines. Swelling improved with diuretic therapy.  All other systems were reviewed and are negative.  Physical Exam: BP 154/88  Pulse 78  Ht 5' (1.524 m)  Wt 161 lb 6.4 oz (73.211 kg)  BMI 31.52 kg/m2 Patient is pleasant and in no acute distress. She looks tired to me. Skin is warm and dry. Color is normal.  HEENT is unremarkable. Normocephalic/atraumatic. PERRL. Sclera are nonicteric. Neck is supple. No masses. No JVD. Lungs are clear. Cardiac exam shows a regular rate and rhythm. Abdomen is soft. Extremities are without edema. Gait and ROM are intact. No gross neurologic deficits noted.  LABORATORY DATA:   Assessment / Plan:

## 2011-02-27 NOTE — Assessment & Plan Note (Signed)
Exercise and weight reduction is encouraged.

## 2011-02-27 NOTE — Patient Instructions (Addendum)
Stop your HCTZ I have restarted your Diovan Hct at 320/25 mg daily We are going to repeat your stress test We need to see you in one month I will see you in one month, sooner if the stress test is abnormal

## 2011-02-27 NOTE — Progress Notes (Signed)
Agree with stress test. Destiny Ballard  

## 2011-02-27 NOTE — Assessment & Plan Note (Signed)
She had prior PCI to the LAD in 2007. Last nuclear was in 2010. Last cath showing patency was in 2008. We will go ahead and update the stress test. I think most of her symptoms are stress induced and unfortunately those issues have no easy answer.

## 2011-02-27 NOTE — Assessment & Plan Note (Signed)
Blood pressure is up. DiovanHct is restarted at 320/25 daily. Will see her in one month. Check BMET on return.

## 2011-03-01 ENCOUNTER — Telehealth: Payer: Self-pay | Admitting: *Deleted

## 2011-03-01 DIAGNOSIS — R7309 Other abnormal glucose: Secondary | ICD-10-CM

## 2011-03-01 NOTE — Telephone Encounter (Signed)
Message copied by Lorayne Bender on Fri Mar 01, 2011  4:47 PM ------      Message from: Rosalio Macadamia      Created: Wed Feb 27, 2011  4:36 PM       Ok to report. Glucose is up. She should have been fasting. Needs Hemoglobin A1C. Needs to be on diabetic diet. Same meds for now.

## 2011-03-01 NOTE — Telephone Encounter (Signed)
Notified of lab results. Will get A1c when has stress test on 8/22

## 2011-03-06 ENCOUNTER — Other Ambulatory Visit (HOSPITAL_COMMUNITY): Payer: No Typology Code available for payment source | Admitting: Radiology

## 2011-03-06 ENCOUNTER — Telehealth: Payer: Self-pay | Admitting: *Deleted

## 2011-03-06 ENCOUNTER — Other Ambulatory Visit (INDEPENDENT_AMBULATORY_CARE_PROVIDER_SITE_OTHER): Payer: PRIVATE HEALTH INSURANCE | Admitting: *Deleted

## 2011-03-06 ENCOUNTER — Ambulatory Visit (HOSPITAL_COMMUNITY): Payer: PRIVATE HEALTH INSURANCE | Attending: Cardiology | Admitting: Radiology

## 2011-03-06 DIAGNOSIS — R0789 Other chest pain: Secondary | ICD-10-CM

## 2011-03-06 DIAGNOSIS — R7309 Other abnormal glucose: Secondary | ICD-10-CM

## 2011-03-06 DIAGNOSIS — R079 Chest pain, unspecified: Secondary | ICD-10-CM | POA: Insufficient documentation

## 2011-03-06 DIAGNOSIS — I4949 Other premature depolarization: Secondary | ICD-10-CM

## 2011-03-06 DIAGNOSIS — I2510 Atherosclerotic heart disease of native coronary artery without angina pectoris: Secondary | ICD-10-CM | POA: Insufficient documentation

## 2011-03-06 LAB — HEMOGLOBIN A1C: Hgb A1c MFr Bld: 7 % — ABNORMAL HIGH (ref 4.6–6.5)

## 2011-03-06 MED ORDER — TECHNETIUM TC 99M TETROFOSMIN IV KIT
33.0000 | PACK | Freq: Once | INTRAVENOUS | Status: AC | PRN
  Administered 2011-03-06: 33 via INTRAVENOUS

## 2011-03-06 MED ORDER — REGADENOSON 0.4 MG/5ML IV SOLN
0.4000 mg | Freq: Once | INTRAVENOUS | Status: AC
  Administered 2011-03-06: 0.4 mg via INTRAVENOUS

## 2011-03-06 MED ORDER — TECHNETIUM TC 99M TETROFOSMIN IV KIT
11.0000 | PACK | Freq: Once | INTRAVENOUS | Status: AC | PRN
  Administered 2011-03-06: 11 via INTRAVENOUS

## 2011-03-06 NOTE — Telephone Encounter (Signed)
Advised patient to see PCP re elevated A1c

## 2011-03-06 NOTE — Progress Notes (Signed)
[facility] SITE 3 NUCLEAR MED [address] [place] [address] [phone]  Cardiology Nuclear Med Study  Destiny Ballard is a 60 y.o. female [id number] 1951-03-04   Nuclear Med Background Indication for Stress Test:  Evaluation for Ischemia and Stent Patency History:  '07 Stent-LAD; '08 Cath:Patent Stent, EF=60%; '10 OZH:YQMVHQ, EF=81% Cardiac Risk Factors: Family History - CAD, Hypertension, Lipids and Obesity  Symptoms:  Chest Pressure.  (last episode of chest discomfort was this a.m., 4/10; none now), DOE and Palpitations   Nuclear Pre-Procedure Caffeine/Decaff Intake:  None NPO After: 9:00pm   Lungs:  Clear.  O2 Sat 97% on RA. IV 0.9% NS with Angio Cath:  20g  IV Site: R Antecubital  IV Started by:  Stanton Kidney, EMT-P  Chest Size (in):  40 Cup Size: C  Height: 5' (1.524 m)  Weight:  160 lb (72.576 kg)  BMI:  Body mass index is 31.25 kg/(m^2). Tech Comments:  Metoprolol held this am; no medications taken, per patient.    Nuclear Med Study 1 or 2 day study: 1 day  Stress Test Type:  Treadmill/Lexiscan  Reading MD: Charlton Haws, MD  Order Authorizing Provider:  Marca Ancona, MD; Burnard Hawthorne, PA  Resting Radionuclide: Technetium 87m Tetrofosmin  Resting Radionuclide Dose: 11.0 mCi   Stress Radionuclide:  Technetium 38m Tetrofosmin  Stress Radionuclide Dose: 33.0 mCi           Stress Protocol Rest HR: 73 Stress HR: 115  Rest BP: 156/85 Stress BP: 195/80  Exercise Time (min): 2:00 METS: n/a   Predicted Max HR: 159 bpm % Max HR: 72.33 bpm Rate Pressure Product: 46962   Dose of Adenosine (mg):  n/a Dose of Lexiscan: 0.4 mg  Dose of Atropine (mg): n/a Dose of Dobutamine: n/a mcg/kg/min (at max HR)  Stress Test Technologist: Smiley Houseman, CMA-N  Nuclear Technologist:  Doyne Keel, CNMT     Rest Procedure:  Myocardial perfusion imaging was performed at rest 45 minutes following the intravenous administration of Technetium 43m  Tetrofosmin.  Rest ECG: No acute changes, rare PVC.  Stress Procedure:  The patient received IV Lexiscan 0.4 mg over 15-seconds with concurrent low level exercise and then Technetium 45m Tetrofosmin was injected at 30-seconds while the patient continued walking one more minute.  There were no significant changes with Lexiscan, rare PVC.  Quantitative spect images were obtained after a 45-minute delay.  Stress ECG: No significant change from baseline ECG  QPS Raw Data Images:  Normal; no motion artifact; normal heart/lung ratio. Stress Images:  Normal homogeneous uptake in all areas of the myocardium. Rest Images:  Normal homogeneous uptake in all areas of the myocardium. Subtraction (SDS):  Normal Transient Ischemic Dilatation (Normal <1.22):  0.98 Lung/Heart Ratio (Normal <0.45):  0.34  Quantitative Gated Spect Images QGS EDV:  54 ml QGS ESV:  13 ml QGS cine images:  NL LV Function; NL Wall Motion QGS EF: 76%  Impression Exercise Capacity:  Lexiscan with low level exercise. BP Response:  Normal blood pressure response. Clinical Symptoms:  No chest pain. ECG Impression:  No significant ST segment change suggestive of ischemia. Comparison with Prior Nuclear Study: No images to compare  Overall Impression:  Normal stress nuclear study.     Charlton Haws

## 2011-03-06 NOTE — Telephone Encounter (Signed)
Message copied by Eugenia Pancoast on Wed Mar 06, 2011  4:22 PM ------      Message from: Rosalio Macadamia      Created: Wed Mar 06, 2011  3:18 PM       Ok to report. She is diabetic. Needs to see a PCP for management. Needs to be on diabetic diet with weight loss.

## 2011-03-31 ENCOUNTER — Emergency Department (HOSPITAL_BASED_OUTPATIENT_CLINIC_OR_DEPARTMENT_OTHER)
Admission: EM | Admit: 2011-03-31 | Discharge: 2011-03-31 | Disposition: A | Discharge status: Home / Self Care | Payer: PRIVATE HEALTH INSURANCE | Attending: Emergency Medicine | Admitting: Emergency Medicine

## 2011-03-31 ENCOUNTER — Encounter (HOSPITAL_BASED_OUTPATIENT_CLINIC_OR_DEPARTMENT_OTHER): Payer: Self-pay | Admitting: *Deleted

## 2011-03-31 DIAGNOSIS — L03317 Cellulitis of buttock: Secondary | ICD-10-CM

## 2011-03-31 DIAGNOSIS — E669 Obesity, unspecified: Secondary | ICD-10-CM | POA: Insufficient documentation

## 2011-03-31 DIAGNOSIS — I259 Chronic ischemic heart disease, unspecified: Secondary | ICD-10-CM | POA: Insufficient documentation

## 2011-03-31 DIAGNOSIS — L0231 Cutaneous abscess of buttock: Secondary | ICD-10-CM | POA: Insufficient documentation

## 2011-03-31 DIAGNOSIS — I10 Essential (primary) hypertension: Secondary | ICD-10-CM | POA: Insufficient documentation

## 2011-03-31 DIAGNOSIS — Z79899 Other long term (current) drug therapy: Secondary | ICD-10-CM | POA: Insufficient documentation

## 2011-03-31 MED ORDER — DOXYCYCLINE HYCLATE 100 MG PO CAPS: 100.0000 mg | ORAL_CAPSULE | Freq: Two times a day (BID) | ORAL | Status: AC

## 2011-03-31 MED ORDER — LIDOCAINE HCL 2 % IJ SOLN
20.0000 mL | Freq: Once | INTRAMUSCULAR | Status: AC
  Administered 2011-03-31: 400 mg via INTRADERMAL
  Filled 2011-03-31: qty 1

## 2011-03-31 NOTE — ED Provider Notes (Signed)
History     CSN: [id number] Arrival date & time: 03/31/2011  1:43 PM   Chief Complaint  Patient presents with  . Abscess     (Include location/radiation/quality/duration/timing/severity/associated sxs/prior treatment) HPI Comments: Pt states that she has was seen for hip pain earlier this week and she is continuing to have pain:pt denies any fall  Patient is a 60 y.o. female presenting with abscess. The history is provided by the patient. No language interpreter was used.  Abscess  This is a new problem. The current episode started today. The problem occurs rarely. The problem has been gradually worsening. The abscess is present on the right buttock. The problem is moderate. The abscess is characterized by redness and painfulness. It is unknown what she was exposed to. The abscess first occurred at home. Her past medical history does not include skin abscesses in family.     Past Medical History  Diagnosis Date  . IHD (ischemic heart disease)     Prior stent to LAD in 2007  . Hypertension   . Hyperlipidemia   . Obesity   . Anxiety     Prior suicide attempt  . Depression   . S/P cardiac catheterization 2008    Stent patent  . Normal nuclear stress test 2010  . Cervical spondylosis      Past Surgical History  Procedure Date  . Cardiac catheterization 06/17/2007    NORMAL. EF 60%  . Cardiac catheterization 07/24/2005    NORMAL. EF 60-70%  . Coronary stent placement 07/2005    LEFT ANTERIOR DESCENDING  . Tubal ligation   . Tonsillectomy and adenoidectomy   . Childbirth     X3  . Breast enhancement surgery   . Cervical spondylosis     SINGLE LEVEL FUSION    Family History  Problem Relation Age of Onset  . Heart attack Mother   . Diabetes Mother   . Cancer Mother   . Suicidality Father     History  Substance Use Topics  . Smoking status: Never Smoker   . Smokeless tobacco: Never Used  . Alcohol Use: No    OB History    Grav Para Term Preterm Abortions TAB  SAB Ect Mult Living                  Review of Systems  All other systems reviewed and are negative.    Allergies  Prednisone  Home Medications   Current Outpatient Rx  Name Route Sig Dispense Refill  . NITROFURANTOIN MACROCRYSTAL 100 MG PO CAPS Oral Take 100 mg by mouth 2 (two) times daily.      [name] POLYETHYLENE GLYCOL 3350 PO PACK Oral Take 17 g by mouth daily.      . ALBUTEROL SULFATE HFA 108 (90 BASE) MCG/ACT IN AERS Inhalation Inhale 2 puffs into the lungs every 6 (six) hours as needed.      . ASPIRIN 81 MG PO TABS Oral Take 81 mg by mouth daily.      [name] VITAMIN D3 1000 UNITS PO CAPS Oral Take by mouth daily.      [name] CLOPIDOGREL BISULFATE 75 MG PO TABS Oral Take 1 tablet (75 mg total) by mouth every other day. 90 tablet 3  . DIPHENHYDRAMINE HCL 25 MG PO TABS Oral Take 25 mg by mouth every 6 (six) hours as needed.      [name] FEXOFENADINE HCL 180 MG PO TABS Oral Take 180 mg by mouth daily.      [name]  FLUOXETINE HCL 40 MG PO CAPS Oral Take 40 mg by mouth daily.      . IRON PO Oral Take by mouth daily.      [name] METOPROLOL SUCCINATE 100 MG PO TB24 Oral Take 100 mg by mouth daily.      [name] ROSUVASTATIN CALCIUM 20 MG PO TABS Oral Take 20 mg by mouth daily.      [name] VALSARTAN-HYDROCHLOROTHIAZIDE 320-25 MG PO TABS Oral Take 1 tablet by mouth daily. 30 tablet 6  . VITAMIN B-12 1000 MCG PO TABS Oral Take 1,000 mcg by mouth daily.      [name] VITAMIN E PO Oral Take by mouth daily.        Physical Exam    BP 167/67  Pulse 95  Temp(Src) 98.5 F (36.9 C) (Oral)  Resp 18  SpO2 97%  Physical Exam  Nursing note and vitals reviewed. Constitutional: She appears well-developed and well-nourished.  Cardiovascular: Normal rate and regular rhythm.   Skin:       Pt has a large area of redness  To the right buttock:with firm area that is 2 cm in diameter    ED Course  INCISION AND DRAINAGE Date/Time: 03/31/2011 2:37 PM Performed by: Teressa Lower Authorized by: Hilario Quarry Consent: Verbal consent  obtained. Written consent not obtained. Risks and benefits: risks, benefits and alternatives were discussed Consent given by: patient Patient understanding: patient states understanding of the procedure being performed Patient identity confirmed: verbally with patient Time out: Immediately prior to procedure a "time out" was called to verify the correct patient, procedure, equipment, support staff and site/side marked as required. Type: abscess Location: right buttock. Anesthesia: local infiltration Local anesthetic: lidocaine 2% without epinephrine Scalpel size: 11 Incision type: single straight Complexity: simple Drainage: purulent Drainage amount: scant Wound treatment: wound left open Packing material: 1/4 in iodoform gauze Patient tolerance: Patient tolerated the procedure well with no immediate complications.    No results found.   No diagnosis found.   MDM Wound I &D without any problem:will treat with antibiotics       Teressa Lower, NP 03/31/11 1439

## 2011-03-31 NOTE — ED Notes (Signed)
Patients wound cleaned and dressed. Bloody drainage noted and instructions given on care for wound

## 2011-03-31 NOTE — ED Notes (Signed)
Care Plan and wound care reviewed

## 2011-03-31 NOTE — ED Notes (Signed)
Red raised area to R glute drainage and wound care reviewed with use of antibiotics

## 2011-03-31 NOTE — ED Notes (Signed)
Pt noticed a red, tender area to her right buttock, but pain radiates to groin area. Also c/o right hip pain.

## 2011-04-01 ENCOUNTER — Ambulatory Visit: Payer: No Typology Code available for payment source | Admitting: Nurse Practitioner

## 2011-04-02 NOTE — ED Provider Notes (Signed)
History/physical exam/procedure(s) were performed by non-physician practitioner and as supervising physician I was immediately available for consultation/collaboration. I have reviewed all notes and am in agreement with care and plan.   Hilario Quarry, MD 04/02/11 1200

## 2011-04-12 ENCOUNTER — Encounter: Payer: Self-pay | Admitting: Nurse Practitioner

## 2011-04-25 ENCOUNTER — Other Ambulatory Visit: Payer: Self-pay | Admitting: *Deleted

## 2011-05-10 ENCOUNTER — Ambulatory Visit
Admission: RE | Admit: 2011-05-10 | Discharge: 2011-05-10 | Disposition: A | Discharge status: Home / Self Care | Payer: No Typology Code available for payment source | Source: Ambulatory Visit | Attending: Family Medicine | Admitting: Family Medicine

## 2011-05-10 ENCOUNTER — Other Ambulatory Visit: Payer: Self-pay | Admitting: Family Medicine

## 2011-06-03 ENCOUNTER — Telehealth: Payer: Self-pay | Admitting: Nurse Practitioner

## 2011-06-03 NOTE — Telephone Encounter (Signed)
H&P,LOV,Cath,Stress faxed to [facility] @ [phone]  06/03/11/km

## 2011-07-11 ENCOUNTER — Ambulatory Visit (INDEPENDENT_AMBULATORY_CARE_PROVIDER_SITE_OTHER): Payer: No Typology Code available for payment source

## 2011-07-11 DIAGNOSIS — J209 Acute bronchitis, unspecified: Secondary | ICD-10-CM

## 2011-07-11 DIAGNOSIS — R05 Cough: Secondary | ICD-10-CM

## 2011-07-11 DIAGNOSIS — R059 Cough, unspecified: Secondary | ICD-10-CM

## 2011-07-17 ENCOUNTER — Ambulatory Visit (INDEPENDENT_AMBULATORY_CARE_PROVIDER_SITE_OTHER): Payer: No Typology Code available for payment source

## 2011-07-17 DIAGNOSIS — J209 Acute bronchitis, unspecified: Secondary | ICD-10-CM

## 2011-08-04 ENCOUNTER — Emergency Department (INDEPENDENT_AMBULATORY_CARE_PROVIDER_SITE_OTHER): Payer: No Typology Code available for payment source

## 2011-08-04 ENCOUNTER — Encounter (HOSPITAL_BASED_OUTPATIENT_CLINIC_OR_DEPARTMENT_OTHER): Payer: Self-pay | Admitting: *Deleted

## 2011-08-04 ENCOUNTER — Emergency Department (HOSPITAL_BASED_OUTPATIENT_CLINIC_OR_DEPARTMENT_OTHER)
Admission: EM | Admit: 2011-08-04 | Discharge: 2011-08-04 | Disposition: A | Discharge status: Home / Self Care | Payer: No Typology Code available for payment source | Attending: Emergency Medicine | Admitting: Emergency Medicine

## 2011-08-04 ENCOUNTER — Other Ambulatory Visit: Payer: Self-pay

## 2011-08-04 DIAGNOSIS — Z79899 Other long term (current) drug therapy: Secondary | ICD-10-CM | POA: Insufficient documentation

## 2011-08-04 DIAGNOSIS — M25519 Pain in unspecified shoulder: Secondary | ICD-10-CM

## 2011-08-04 DIAGNOSIS — I10 Essential (primary) hypertension: Secondary | ICD-10-CM | POA: Insufficient documentation

## 2011-08-04 DIAGNOSIS — R079 Chest pain, unspecified: Secondary | ICD-10-CM

## 2011-08-04 DIAGNOSIS — M47812 Spondylosis without myelopathy or radiculopathy, cervical region: Secondary | ICD-10-CM | POA: Insufficient documentation

## 2011-08-04 DIAGNOSIS — R0789 Other chest pain: Secondary | ICD-10-CM

## 2011-08-04 DIAGNOSIS — E669 Obesity, unspecified: Secondary | ICD-10-CM | POA: Insufficient documentation

## 2011-08-04 DIAGNOSIS — E785 Hyperlipidemia, unspecified: Secondary | ICD-10-CM | POA: Insufficient documentation

## 2011-08-04 DIAGNOSIS — Z7982 Long term (current) use of aspirin: Secondary | ICD-10-CM | POA: Insufficient documentation

## 2011-08-04 DIAGNOSIS — F411 Generalized anxiety disorder: Secondary | ICD-10-CM | POA: Insufficient documentation

## 2011-08-04 DIAGNOSIS — R071 Chest pain on breathing: Secondary | ICD-10-CM | POA: Insufficient documentation

## 2011-08-04 LAB — DIFFERENTIAL
Basophils Absolute: 0 10*3/uL (ref 0.0–0.1)
Basophils Relative: 0 % (ref 0–1)
Eosinophils Absolute: 0.2 10*3/uL (ref 0.0–0.7)
Eosinophils Relative: 2 % (ref 0–5)
Lymphocytes Relative: 35 % (ref 12–46)
Lymphs Abs: 3.1 10*3/uL (ref 0.7–4.0)
Monocytes Absolute: 0.8 10*3/uL (ref 0.1–1.0)
Monocytes Relative: 9 % (ref 3–12)
Neutro Abs: 4.7 10*3/uL (ref 1.7–7.7)
Neutrophils Relative %: 54 % (ref 43–77)

## 2011-08-04 LAB — COMPREHENSIVE METABOLIC PANEL
ALT: 18 U/L (ref 0–35)
AST: 18 U/L (ref 0–37)
Albumin: 4.2 g/dL (ref 3.5–5.2)
Alkaline Phosphatase: 82 U/L (ref 39–117)
BUN: 17 mg/dL (ref 6–23)
CO2: 28 mEq/L (ref 19–32)
Calcium: 9.7 mg/dL (ref 8.4–10.5)
Chloride: 101 mEq/L (ref 96–112)
Creatinine, Ser: 0.6 mg/dL (ref 0.50–1.10)
GFR calc Af Amer: >90 mL/min (ref >90)
GFR calc non Af Amer: >90 mL/min (ref >90)
Glucose, Bld: 81 mg/dL (ref 70–99)
Potassium: 3.3 mEq/L — ABNORMAL LOW (ref 3.5–5.1)
Sodium: 140 mEq/L (ref 135–145)
Total Bilirubin: 0.2 mg/dL — ABNORMAL LOW (ref 0.3–1.2)
Total Protein: 8.1 g/dL (ref 6.0–8.3)

## 2011-08-04 LAB — CBC
HCT: 41.8 % (ref 36.0–46.0)
Hemoglobin: 13.6 g/dL (ref 12.0–15.0)
MCH: 25.2 pg — ABNORMAL LOW (ref 26.0–34.0)
MCHC: 32.5 g/dL (ref 30.0–36.0)
MCV: 77.6 fL — ABNORMAL LOW (ref 78.0–100.0)
Platelets: 215 10*3/uL (ref 150–400)
RBC: 5.39 MIL/uL — ABNORMAL HIGH (ref 3.87–5.11)
RDW: 14.4 % (ref 11.5–15.5)
WBC: 8.8 10*3/uL (ref 4.0–10.5)

## 2011-08-04 LAB — LIPASE, BLOOD: Lipase: 41 U/L (ref 11–59)

## 2011-08-04 LAB — D-DIMER, QUANTITATIVE: D-Dimer, Quant: <0.22 ug/mL-FEU (ref 0.00–0.48)

## 2011-08-04 LAB — TROPONIN I: Troponin I: <0.3 ng/mL (ref <0.30)

## 2011-08-04 MED ORDER — CYCLOBENZAPRINE HCL 10 MG PO TABS: 10.0000 mg | ORAL_TABLET | Freq: Three times a day (TID) | ORAL | Status: DC | PRN

## 2011-08-04 MED ORDER — HYDROMORPHONE HCL PF 1 MG/ML IJ SOLN
1.0000 mg | Freq: Once | INTRAMUSCULAR | Status: AC
  Administered 2011-08-04: 1 mg via INTRAVENOUS
  Filled 2011-08-04: qty 1

## 2011-08-04 NOTE — ED Provider Notes (Signed)
History   This chart was scribed for Destiny Quarry, MD by Melba Coon. The patient was seen in room MH07/MH07 and the patient's care was started at 5:18PM.    CSN: [id number]  Arrival date & time 08/04/11  1621   First MD Initiated Contact with Patient 08/04/11 1716      Chief Complaint  Patient presents with  . Shoulder Pain    (Consider location/radiation/quality/duration/timing/severity/associated sxs/prior treatment) HPI Destiny Ballard is a 61 y.o. female who presents to the Emergency Department complaining of constant, moderate to severe right shoulder pain with an onset this morning. Pt states it feels like a pulled muscle and she wasn't doing anything to cause the pain when it started. Pt took Tylenol x2 which alleviated the pain slightly but did not eliminate the pain, so pt took husband's morphine. Pain is rated 5/10 at time of PE, but exertion of shoulder increases the severity of the pain. Pt also takes oxycodone. Has not eaten full meals today. HTN and hyperlipidemia present. No nausea, vomit, SOB, or cough higher than baseline. Allergic to prednisone.  PCP: [facility] in [place]  Past Medical History  Diagnosis Date  . IHD (ischemic heart disease)     Prior stent to LAD in 2007  . Hypertension   . Hyperlipidemia   . Obesity   . Anxiety     Prior suicide attempt  . Depression   . S/P cardiac catheterization 2008    Stent patent  . Normal nuclear stress test 2010  . Cervical spondylosis     Past Surgical History  Procedure Date  . Cardiac catheterization 06/17/2007    NORMAL. EF 60%  . Cardiac catheterization 07/24/2005    NORMAL. EF 60-70%  . Coronary stent placement 07/2005    LEFT ANTERIOR DESCENDING  . Tubal ligation   . Tonsillectomy and adenoidectomy   . Childbirth     X3  . Breast enhancement surgery   . Cervical spondylosis     SINGLE LEVEL FUSION    Family History  Problem Relation Age of Onset  . Heart attack Mother   . Diabetes  Mother   . Cancer Mother   . Suicidality Father     History  Substance Use Topics  . Smoking status: Never Smoker   . Smokeless tobacco: Never Used  . Alcohol Use: Yes     social    OB History    Grav Para Term Preterm Abortions TAB SAB Ect Mult Living                  Review of Systems 10 Systems reviewed and are negative for acute change except as noted in the HPI.  Allergies  Prednisone  Home Medications   Current Outpatient Rx  Name Route Sig Dispense Refill  . ALBUTEROL SULFATE HFA 108 (90 BASE) MCG/ACT IN AERS Inhalation Inhale 2 puffs into the lungs every 6 (six) hours as needed.      . ASPIRIN 81 MG PO TABS Oral Take 81 mg by mouth daily.      [name] VITAMIN D3 1000 UNITS PO CAPS Oral Take by mouth daily.      [name] CLOPIDOGREL BISULFATE 75 MG PO TABS Oral Take 1 tablet (75 mg total) by mouth every other day. 90 tablet 3  . DIPHENHYDRAMINE HCL 25 MG PO TABS Oral Take 25 mg by mouth every 6 (six) hours as needed.      [name] FEXOFENADINE HCL 180 MG PO TABS Oral Take  180 mg by mouth daily.      [name] FLUOXETINE HCL 40 MG PO CAPS Oral Take 40 mg by mouth daily.      . IRON PO Oral Take by mouth daily.      [name] METOPROLOL SUCCINATE ER 100 MG PO TB24 Oral Take 100 mg by mouth daily.      [name] NITROFURANTOIN MACROCRYSTAL 100 MG PO CAPS Oral Take 100 mg by mouth 2 (two) times daily.      [name] POLYETHYLENE GLYCOL 3350 PO PACK Oral Take 17 g by mouth daily.      [name] ROSUVASTATIN CALCIUM 20 MG PO TABS Oral Take 20 mg by mouth daily.      [name] VALSARTAN-HYDROCHLOROTHIAZIDE 320-25 MG PO TABS Oral Take 1 tablet by mouth daily. 30 tablet 6  . VITAMIN B-12 1000 MCG PO TABS Oral Take 1,000 mcg by mouth daily.      [name] VITAMIN E PO Oral Take by mouth daily.        BP 166/86  Pulse 96  Temp(Src) 98.2 F (36.8 C) (Oral)  Resp 24  Ht 5' (1.524 m)  Wt 157 lb (71.215 kg)  BMI 30.66 kg/m2  SpO2 97%  Physical Exam  Constitutional: She is oriented to person, place, and time. She appears well-developed and  well-nourished.  HENT:  Head: Normocephalic and atraumatic.  Right Ear: External ear normal.  Left Ear: External ear normal.  Eyes: Conjunctivae and EOM are normal. Pupils are equal, round, and reactive to light. No scleral icterus.  Neck: Normal range of motion. Neck supple. No thyromegaly present.  Cardiovascular: Normal rate, regular rhythm and normal heart sounds.  Exam reveals no gallop and no friction rub.   No murmur heard. Pulmonary/Chest: Effort normal and breath sounds normal. No stridor. She has no wheezes. She has no rales. She exhibits no tenderness.  Abdominal: Soft. She exhibits no distension. There is no tenderness. There is no rebound.  Musculoskeletal: Normal range of motion. She exhibits tenderness (Increased pain with lateral extension of shoulder and external rotation of shoulder on right side). She exhibits no edema.  Lymphadenopathy:    She has no cervical adenopathy.  Neurological: She is alert and oriented to person, place, and time. Coordination normal.  Skin: Skin is warm. No rash noted. No erythema.  Psychiatric: She has a normal mood and affect. Her behavior is normal.    ED Course  Procedures (including critical care time)  DIAGNOSTIC STUDIES: Oxygen Saturation is 97% on room air, normal by my interpretation.    COORDINATION OF CARE:  Results for orders placed during the hospital encounter of 08/04/11  CBC      Component Value Range   WBC 8.8  4.0 - 10.5 (K/uL)   RBC 5.39 (*) 3.87 - 5.11 (MIL/uL)   Hemoglobin 13.6  12.0 - 15.0 (g/dL)   HCT 16.1  09.6 - 04.5 (%)   MCV 77.6 (*) 78.0 - 100.0 (fL)   MCH 25.2 (*) 26.0 - 34.0 (pg)   MCHC 32.5  30.0 - 36.0 (g/dL)   RDW 40.9  81.1 - 91.4 (%)   Platelets 215  150 - 400 (K/uL)  DIFFERENTIAL      Component Value Range   Neutrophils Relative 54  43 - 77 (%)   Neutro Abs 4.7  1.7 - 7.7 (K/uL)   Lymphocytes Relative 35  12 - 46 (%)   Lymphs Abs 3.1  0.7 - 4.0 (K/uL)   Monocytes Relative 9  3 - 12 (%)  Monocytes Absolute 0.8  0.1 - 1.0 (K/uL)   Eosinophils Relative 2  0 - 5 (%)   Eosinophils Absolute 0.2  0.0 - 0.7 (K/uL)   Basophils Relative 0  0 - 1 (%)   Basophils Absolute 0.0  0.0 - 0.1 (K/uL)  COMPREHENSIVE METABOLIC PANEL      Component Value Range   Sodium 140  135 - 145 (mEq/L)   Potassium 3.3 (*) 3.5 - 5.1 (mEq/L)   Chloride 101  96 - 112 (mEq/L)   CO2 28  19 - 32 (mEq/L)   Glucose, Bld 81  70 - 99 (mg/dL)   BUN 17  6 - 23 (mg/dL)   Creatinine, Ser 9.56  0.50 - 1.10 (mg/dL)   Calcium 9.7  8.4 - 21.3 (mg/dL)   Total Protein 8.1  6.0 - 8.3 (g/dL)   Albumin 4.2  3.5 - 5.2 (g/dL)   AST 18  0 - 37 (U/L)   ALT 18  0 - 35 (U/L)   Alkaline Phosphatase 82  39 - 117 (U/L)   Total Bilirubin 0.2 (*) 0.3 - 1.2 (mg/dL)   GFR calc non Af Amer >90  >90 (mL/min)   GFR calc Af Amer >90  >90 (mL/min)  LIPASE, BLOOD      Component Value Range   Lipase 41  11 - 59 (U/L)  TROPONIN I      Component Value Range   Troponin I <0.30  <0.30 (ng/mL)  D-DIMER, QUANTITATIVE      Component Value Range   D-Dimer, Quant <0.22  0.00 - 0.48 (ug/mL-FEU)    Dg Chest 2 View  08/04/2011  *RADIOLOGY REPORT*  Clinical Data: Right shoulder pain radiating to the right chest.  CHEST - 2 VIEW  Comparison: PA and lateral chest 04/27/2010.  Findings: There is no focal airspace disease or effusion.  Heart size is normal.  No focal bony abnormality.  IMPRESSION: No acute finding.  Original Report Authenticated By: Bernadene Bell. Maricela Curet, M.D.   Dg Shoulder Right  08/04/2011  *RADIOLOGY REPORT*  Clinical Data: Right shoulder pain.  RIGHT SHOULDER - 2+ VIEW  Comparison: None.  Findings: Humerus is located and the acromioclavicular joint is intact.  No fracture.  Remote right rib fracture noted.  IMPRESSION: No acute finding.  Original Report Authenticated By: Bernadene Bell. Maricela Curet, M.D.     No diagnosis found.   Date: 08/04/2011  Rate: 90  Rhythm: normal sinus rhythm  QRS Axis: normal  Intervals: normal  ST/T  Wave abnormalities: normal  Conduction Disutrbances:none  Narrative Interpretation:   Old EKG Reviewed: none available q wves in v1 and v2    MDM   Pain is right-sided it and it does not appear to be cardiac in nature. Cardiac enzymes are normal as is d-dimer.  EKG without evidence of acute cardiac ischemia. Pain is reproducible with right shoulder movement.   I personally performed the services described in this documentation, which was scribed in my presence. The recorded information has been reviewed and considered.        Destiny Quarry, MD 08/04/11 2010

## 2011-08-04 NOTE — ED Notes (Signed)
Pt states she got up this morning and sat down at her computer and her right shoulder blade started hurting radiating into her right chest. Hurts to move or take deep breath. Unable to produce pain with palpation. Hurts to sit. BBS-clr. Cannot move arm back without producing pain. EKG done at triage. Denies other s/s.

## 2011-08-04 NOTE — ED Notes (Signed)
I answered patient call bell, patient was upset that she does not know what is taking so long in getting results back. Patient also wants something to drink. I notified nurse.

## 2011-09-02 ENCOUNTER — Other Ambulatory Visit: Payer: Self-pay | Admitting: Internal Medicine

## 2011-09-02 DIAGNOSIS — Z1231 Encounter for screening mammogram for malignant neoplasm of breast: Secondary | ICD-10-CM

## 2011-09-06 ENCOUNTER — Ambulatory Visit
Admission: RE | Admit: 2011-09-06 | Discharge: 2011-09-06 | Disposition: A | Discharge status: Home / Self Care | Payer: No Typology Code available for payment source | Source: Ambulatory Visit | Attending: Internal Medicine | Admitting: Internal Medicine

## 2011-09-06 DIAGNOSIS — Z1231 Encounter for screening mammogram for malignant neoplasm of breast: Secondary | ICD-10-CM

## 2011-09-16 ENCOUNTER — Ambulatory Visit (INDEPENDENT_AMBULATORY_CARE_PROVIDER_SITE_OTHER): Payer: No Typology Code available for payment source | Admitting: Family Medicine

## 2011-09-16 VITALS — BP 168/92 | HR 96 | Temp 97.8°F | Resp 18 | Ht 60.0 in | Wt 159.0 lb

## 2011-09-16 DIAGNOSIS — I10 Essential (primary) hypertension: Secondary | ICD-10-CM

## 2011-09-16 DIAGNOSIS — Z0000 Encounter for general adult medical examination without abnormal findings: Secondary | ICD-10-CM

## 2011-09-16 DIAGNOSIS — Z131 Encounter for screening for diabetes mellitus: Secondary | ICD-10-CM

## 2011-09-16 LAB — COMPREHENSIVE METABOLIC PANEL
ALT: 27 U/L (ref 0–35)
AST: 22 U/L (ref 0–37)
Albumin: 4.2 g/dL (ref 3.5–5.2)
Alkaline Phosphatase: 89 U/L (ref 39–117)
BUN: 19 mg/dL (ref 6–23)
CO2: 27 mEq/L (ref 19–32)
Calcium: 9.5 mg/dL (ref 8.4–10.5)
Chloride: 104 mEq/L (ref 96–112)
Creat: 0.58 mg/dL (ref 0.50–1.10)
Glucose, Bld: 105 mg/dL — ABNORMAL HIGH (ref 70–99)
Potassium: 4.1 mEq/L (ref 3.5–5.3)
Sodium: 142 mEq/L (ref 135–145)
Total Bilirubin: 0.3 mg/dL (ref 0.3–1.2)
Total Protein: 7.2 g/dL (ref 6.0–8.3)

## 2011-09-16 LAB — POCT URINALYSIS DIPSTICK
Bilirubin, UA: NEGATIVE
Blood, UA: NEGATIVE
Glucose, UA: NEGATIVE
Ketones, UA: NEGATIVE
Nitrite, UA: NEGATIVE
Protein, UA: 30
Spec Grav, UA: >=1.03
Urobilinogen, UA: 0.2
pH, UA: 5.5

## 2011-09-16 LAB — POCT CBC
Granulocyte percent: 61.5 %G (ref 37–80)
HCT, POC: 41.9 % (ref 37.7–47.9)
Hemoglobin: 13.4 g/dL (ref 12.2–16.2)
Lymph, poc: 2.3 (ref 0.6–3.4)
MCH, POC: 25.1 pg — AB (ref 27–31.2)
MCHC: 32 g/dL (ref 31.8–35.4)
MCV: 78.5 fL — AB (ref 80–97)
MID (cbc): 0.7 (ref 0–0.9)
MPV: 9.5 fL (ref 0–99.8)
POC Granulocyte: 4.9 (ref 2–6.9)
POC LYMPH PERCENT: 29.1 %L (ref 10–50)
POC MID %: 9.4 %M (ref 0–12)
Platelet Count, POC: 334 10*3/uL (ref 142–424)
RBC: 5.34 M/uL (ref 4.04–5.48)
RDW, POC: 15.2 %
WBC: 7.9 10*3/uL (ref 4.6–10.2)

## 2011-09-16 LAB — LIPID PANEL
Cholesterol: 248 mg/dL — ABNORMAL HIGH (ref 0–200)
HDL: 49 mg/dL (ref >39)
LDL Cholesterol: 144 mg/dL — ABNORMAL HIGH (ref 0–99)
Total CHOL/HDL Ratio: 5.1 Ratio
Triglycerides: 273 mg/dL — ABNORMAL HIGH (ref <150)
VLDL: 55 mg/dL — ABNORMAL HIGH (ref 0–40)

## 2011-09-16 LAB — POCT GLYCOSYLATED HEMOGLOBIN (HGB A1C): Hemoglobin A1C: 6.2

## 2011-09-16 LAB — IFOBT (OCCULT BLOOD): IFOBT: POSITIVE

## 2011-09-16 LAB — TSH: TSH: 0.444 u[IU]/mL (ref 0.350–4.500)

## 2011-09-16 NOTE — Progress Notes (Signed)
[facility]:  Office Visit  Chief Complaint:  Chief Complaint  Patient presents with  . Annual Exam    HPI: Destiny Ballard is a 61 y.o. female who complains of  Annual exam. Doing well. In last year she has had only to go to hospital for lithotripsy and ? Stent placement.  Last Pap-last year, no abnormal pap Mammogram-recently done in January 2013, negative results. Colonoscopy-need to schedule one, Need [facility] physician.  Bone density normal and UTD   Past Medical History  Diagnosis Date  . IHD (ischemic heart disease)     Prior stent to LAD in 2007  . Hypertension   . Hyperlipidemia   . Obesity   . Anxiety     Prior suicide attempt  . Depression   . S/P cardiac catheterization 2008    Stent patent  . Normal nuclear stress test 2010  . Cervical spondylosis    Past Surgical History  Procedure Date  . Cardiac catheterization 06/17/2007    NORMAL. EF 60%  . Cardiac catheterization 07/24/2005    NORMAL. EF 60-70%  . Coronary stent placement 07/2005    LEFT ANTERIOR DESCENDING  . Tubal ligation   . Tonsillectomy and adenoidectomy   . Childbirth     X3  . Breast enhancement surgery   . Cervical spondylosis     SINGLE LEVEL FUSION   History   Social History  . Marital Status: Married    Spouse Name: N/A    Number of Children: N/A  . Years of Education: N/A   Social History Main Topics  . Smoking status: Never Smoker   . Smokeless tobacco: Never Used  . Alcohol Use: Yes     social  . Drug Use: No  . Sexually Active: Yes   Other Topics Concern  . None   Social History Narrative  . None   Family History  Problem Relation Age of Onset  . Heart attack Mother   . Diabetes Mother   . Cancer Mother   . Suicidality Father    Allergies  Allergen Reactions  . Prednisone     REACTION: mood swings, nightmares   Prior to Admission medications   Medication Sig Start Date End Date Taking? Authorizing Provider  albuterol (PROAIR HFA)  108 (90 BASE) MCG/ACT inhaler Inhale 2 puffs into the lungs every 6 (six) hours as needed.     Yes Historical Provider, MD  aspirin 81 MG tablet Take 81 mg by mouth daily.     Yes Historical Provider, MD  Cholecalciferol (VITAMIN D3) 1000 UNITS CAPS Take by mouth daily.     Yes Historical Provider, MD  clopidogrel (PLAVIX) 75 MG tablet Take 1 tablet (75 mg total) by mouth every other day. 02/27/11  Yes Rosalio Macadamia, NP  fexofenadine (ALLEGRA) 180 MG tablet Take 180 mg by mouth daily.     Yes Historical Provider, MD  FLUoxetine (PROZAC) 40 MG capsule Take 40 mg by mouth daily.     Yes Historical Provider, MD  IRON PO Take by mouth daily.     Yes Historical Provider, MD  metoprolol (TOPROL-XL) 100 MG 24 hr tablet Take 100 mg by mouth daily.     Yes Historical Provider, MD  rosuvastatin (CRESTOR) 20 MG tablet Take 20 mg by mouth daily.     Yes Historical Provider, MD  valsartan-hydrochlorothiazide (DIOVAN HCT) 320-25 MG per tablet Take 1 tablet by mouth daily. 02/27/11 02/27/12 Yes Rosalio Macadamia, NP  VITAMIN  E PO Take by mouth daily.     Yes Historical Provider, MD  diphenhydrAMINE (BENADRYL) 25 MG tablet Take 25 mg by mouth every 6 (six) hours as needed.      Historical Provider, MD  nitrofurantoin (MACRODANTIN) 100 MG capsule Take 100 mg by mouth 2 (two) times daily.      Historical Provider, MD  polyethylene glycol (MIRALAX / GLYCOLAX) packet Take 17 g by mouth daily.      Historical Provider, MD  vitamin B-12 (CYANOCOBALAMIN) 1000 MCG tablet Take 1,000 mcg by mouth daily.      Historical Provider, MD     ROS: The patient denies fevers, chills, night sweats, unintentional weight loss, chest pain, palpitations, wheezing, dyspnea on exertion, nausea, vomiting, abdominal pain, dysuria, hematuria, melena, numbness, weakness, or tingling.   All other systems have been reviewed and were otherwise negative with the exception of those mentioned in the HPI and as above.    PHYSICAL EXAM: Filed  Vitals:   09/16/11 1035  BP: 168/92  Pulse: 96  Temp:   Resp:    Filed Vitals:   09/16/11 0852  Height: 5' (1.524 m)  Weight: 159 lb (72.122 kg)   Body mass index is 31.05 kg/(m^2).  General: Alert, no acute distress, overweight HEENT:  Normocephalic, atraumatic, oropharynx patent. TM normal Cardiovascular:  Regular rate and rhythm, no rubs murmurs or gallops.  No Carotid bruits, radial pulse intact. No pedal edema.  Respiratory: Clear to auscultation bilaterally.  No wheezes, rales, or rhonchi.  No cyanosis, no use of accessory musculature GI: No organomegaly, abdomen is soft and non-tender, positive bowel sounds.  No masses. Skin: No rashes. Neurologic: Facial musculature symmetric. Psychiatric: Patient is appropriate throughout our interaction. Lymphatic: No cervical lymphadenopathy Musculoskeletal: Gait intact. Breast exam: normal Rectal exam normal Defer pelvic since had it at OB/GYn in last 3 months.   LABS:  EKG/XRAY:   Primary read interpreted by Dr. Conley Rolls at [facility].   ASSESSMENT/PLAN: Encounter Diagnoses  Name Primary?  . Annual physical exam Yes  . HTN (hypertension)   . Screening for diabetes mellitus    1. Doing well. Annual labs pending. 2. Did not take BP meds today since had GI bug yesterday. Asked patient to go home and take her BP meds.  3. Need Colonoscopy Referral. 4. F/u in 1 year for annual and 6 months for HTN and XOL with either [place] or her cardiologist.    Hamilton Capri PHUONG, DO 09/16/2011 11:08 AM

## 2011-09-17 ENCOUNTER — Ambulatory Visit (INDEPENDENT_AMBULATORY_CARE_PROVIDER_SITE_OTHER): Payer: No Typology Code available for payment source | Admitting: Cardiology

## 2011-09-17 ENCOUNTER — Encounter: Payer: Self-pay | Admitting: Cardiology

## 2011-09-17 DIAGNOSIS — I2510 Atherosclerotic heart disease of native coronary artery without angina pectoris: Secondary | ICD-10-CM

## 2011-09-17 DIAGNOSIS — I10 Essential (primary) hypertension: Secondary | ICD-10-CM

## 2011-09-17 DIAGNOSIS — E785 Hyperlipidemia, unspecified: Secondary | ICD-10-CM

## 2011-09-17 LAB — VITAMIN D 25 HYDROXY (VIT D DEFICIENCY, FRACTURES): Vit D, 25-Hydroxy: 26 ng/mL — ABNORMAL LOW (ref 30–89)

## 2011-09-17 MED ORDER — ROSUVASTATIN CALCIUM 20 MG PO TABS: 20.0000 mg | ORAL_TABLET | Freq: Every day | ORAL | Status: DC

## 2011-09-17 MED ORDER — METOPROLOL SUCCINATE ER 100 MG PO TB24: 100.0000 mg | ORAL_TABLET | Freq: Every day | ORAL | Status: DC

## 2011-09-17 NOTE — Assessment & Plan Note (Signed)
She needs to take her Crestor regularly.  She will try to do this.  Repeat lipids/LFTs in 2 months with goal LDL < 70.

## 2011-09-17 NOTE — Patient Instructions (Signed)
You can stop Plavix.  Take Crestor every day.  Take and record your blood pressure daily. I will call you in about 3 weeks to get the readings. Destiny Ballard [phone]  Your physician recommends that you return for a FASTING lipid profile /liver profile in 2 months.  Your physician wants you to follow-up in: 6 months with Dr Shirlee Latch.(September 2013). You will receive a reminder letter in the mail two months in advance. If you don't receive a letter, please call our office to schedule the follow-up appointment.

## 2011-09-17 NOTE — Assessment & Plan Note (Signed)
BP is high today but she did not take any of her medications today.  I will have her check her BP every other day or so after taking medications and record.  We will call for BP check in 2 wks.

## 2011-09-17 NOTE — Progress Notes (Signed)
PCP: Dr. Merla Riches  61 yo with history of CAD s/p LAD PCI presents for cardiology followup.  She has had a long history of atypical chest pain since her procedure.  Most recent myoview was in 8/12 and was normal.  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  She gets chest tightness with emotion stress.  This will occur about once a week.  This is a chronic pattern.  No exertional chest pain.  Chronic mild exertional dyspnea after walking up a flight of steps.  No dyspnea on flat ground.  LDL was high in 3/13: she has not been taking her Crestor regularly.  BP is high today but she did not take any of her cardiac medications.    Labs (3/13): LDL 144, HDL 49, TSH normal, K 4.1, creatinine 0.6  PMH: 1. CAD: LAD PCI in 2007.  LHC 2008 with patent LAD stent.  Normal stress myoview in 2010.  Lexiscan myoview in 8/12 showed EF 76%, no ischemia or infarction.  2. HTN 3. Hyperlipidemia 4. Obesity 5. Anxiety 6. Depression: Prior suicide attempt 7. Cervical spondylosis 8. 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  Lives in [place], nonsmoker.   FH: Mother with MI at 41, Sister with MI, Brother with MI at 58.   ROS: All systems reviewed and negative except as per HPI.   Current Outpatient Prescriptions  Medication Sig Dispense Refill  . aspirin 81 MG tablet Take 81 mg by mouth daily.        . Cholecalciferol (VITAMIN D3) 1000 UNITS CAPS Take by mouth daily.        . ferrous sulfate 325 (65 FE) MG tablet Take 325 mg by mouth daily with breakfast.      . fexofenadine (ALLEGRA) 180 MG tablet Take 180 mg by mouth daily.        [name] FLUoxetine (PROZAC) 40 MG capsule Take 40 mg by mouth daily.        . fluticasone (VERAMYST) 27.5 MCG/SPRAY nasal spray Place 2 sprays into the nose daily.      . IRON PO Take by mouth daily.        . metoprolol succinate (TOPROL-XL) 100 MG 24 hr tablet Take 1 tablet (100 mg total) by mouth daily.  30 tablet  6  . Simethicone (GAS-X PO) Take by mouth as  needed.      . valsartan-hydrochlorothiazide (DIOVAN HCT) 320-25 MG per tablet Take 1 tablet by mouth daily.  30 tablet  6  . vitamin B-12 (CYANOCOBALAMIN) 1000 MCG tablet Take 1,000 mcg by mouth daily.        [name] VITAMIN E PO Take by mouth daily.        [name] DISCONTD: metoprolol (TOPROL-XL) 100 MG 24 hr tablet Take 100 mg by mouth daily.        [name] DISCONTD: rosuvastatin (CRESTOR) 20 MG tablet Take 20 mg by mouth daily.        . rosuvastatin (CRESTOR) 20 MG tablet Take 1 tablet (20 mg total) by mouth at bedtime.  30 tablet  11    BP 175/85  Pulse 87  Wt 161 lb (73.029 kg) General: NAD Neck: No JVD, no thyromegaly or thyroid nodule.  Lungs: Clear to auscultation bilaterally with normal respiratory effort. CV: Nondisplaced PMI.  Heart regular S1/S2, no S3/S4, no murmur.  No peripheral edema.  No carotid bruit.  Normal pedal pulses.  Abdomen: Soft, nontender, no hepatosplenomegaly, no distention.  Neurologic: Alert  and oriented x 3.  Psych: Normal affect. Extremities: No clubbing or cyanosis.

## 2011-09-17 NOTE — Assessment & Plan Note (Signed)
Stable pattern of atypical chest pain.  No chest pain with exertion.  Normal myoview in 8/12.  Continue ASA 81, ARB, and beta blocker.  She will start taking her statin regularly.  She asks about stopping Plavix.  PCI was in 2007 and stent looked ok on 2008 cath.  I think it is reasonable to stop Plavix.

## 2011-09-21 ENCOUNTER — Telehealth: Payer: Self-pay | Admitting: Family Medicine

## 2011-09-21 NOTE — Telephone Encounter (Signed)
Spoke with patient regarding labs. She knows she has to work on her lipids. She still needs referral for screening colonoscopy. Need Dr under Tedd Sias plan.

## 2011-10-04 ENCOUNTER — Ambulatory Visit (INDEPENDENT_AMBULATORY_CARE_PROVIDER_SITE_OTHER): Payer: No Typology Code available for payment source | Admitting: Family Medicine

## 2011-10-04 VITALS — BP 125/70 | HR 80 | Temp 98.3°F | Resp 16 | Ht 59.5 in | Wt 156.8 lb

## 2011-10-04 DIAGNOSIS — J069 Acute upper respiratory infection, unspecified: Secondary | ICD-10-CM

## 2011-10-04 DIAGNOSIS — H538 Other visual disturbances: Secondary | ICD-10-CM

## 2011-10-04 LAB — GLUCOSE, POCT (MANUAL RESULT ENTRY): POC Glucose: 136

## 2011-10-04 MED ORDER — HYDROCODONE-HOMATROPINE 5-1.5 MG/5ML PO SYRP: 5.0000 mL | ORAL_SOLUTION | Freq: Three times a day (TID) | ORAL | Status: DC | PRN

## 2011-10-04 NOTE — Progress Notes (Signed)
Patient Name: Destiny Ballard Date of Birth: 1950/09/17 Medical Record Number: [id number] Gender: female Date of Encounter: 10/04/2011  History of Present Illness:  Destiny Ballard is a 61 y.o. very pleasant female patient who presents with the following:  Here today with illness.  Coughing for about 3 days- dry cough. Scant discharge from her nose.  Does have PND.  She notes that her chest hurts with cough- this is a cough related CP, not worrysome CP.  No fever that she has noted- she has had chills and body aches. Husband ill recently with similar symptoms.  The cough keeps her awake at night.    However, her main issue today is actually blurred vision.  She notes blurred vision since yesterday.  She is wearing her contacts.  She lost her glasses over the weekend- however she was doing well in her contacts until yesterday.  The blurriness is mostly in her right eye, although the left is a little blurry as well.  Eyes feel comfortable and otherwise normal.  No FB sensation.  No photophobia.  She last had her eyes checked within the last few months- Dr. Emily Filbert.  Never had this problem before.  She has trouble reading with the right eye which is interfering with her work.    Patient Active Problem List  Diagnoses  . HYPERLIPIDEMIA  . MORBID OBESITY  . ANEMIA-IRON DEFICIENCY  . HYPERTENSION  . CORONARY ARTERY DISEASE  . GERD  . HEADACHE, CHRONIC  . COUGH   Past Medical History  Diagnosis Date  . IHD (ischemic heart disease)     Prior stent to LAD in 2007  . Hypertension   . Hyperlipidemia   . Obesity   . Anxiety     Prior suicide attempt  . Depression   . S/P cardiac catheterization 2008    Stent patent  . Normal nuclear stress test 2010  . Cervical spondylosis    Past Surgical History  Procedure Date  . Cardiac catheterization 06/17/2007    NORMAL. EF 60%  . Cardiac catheterization 07/24/2005    NORMAL. EF 60-70%  . Coronary stent placement 07/2005    LEFT ANTERIOR  DESCENDING  . Tubal ligation   . Tonsillectomy and adenoidectomy   . Childbirth     X3  . Breast enhancement surgery   . Cervical spondylosis     SINGLE LEVEL FUSION   History  Substance Use Topics  . Smoking status: Never Smoker   . Smokeless tobacco: Never Used  . Alcohol Use: Yes     social   Family History  Problem Relation Age of Onset  . Heart attack Mother   . Diabetes Mother   . Cancer Mother   . Suicidality Father    Allergies  Allergen Reactions  . Prednisone     REACTION: mood swings, nightmares    Medication list has been reviewed and updated.  Review of Systems: As per HPI- otherwise negative.  Physical Examination: Filed Vitals:   10/04/11 0811  BP: 125/70  Pulse: 80  Temp: 98.3 F (36.8 C)  TempSrc: Oral  Resp: 16  Height: 4' 11.5" (1.511 m)  Weight: 156 lb 12.8 oz (71.124 kg)    Body mass index is 31.14 kg/(m^2).  GEN: WDWN, NAD, Non-toxic, A & O x 3, overweight HEENT: Atraumatic, Normocephalic. Neck supple. No masses, No LAD.  TM, oropharynx wnl PEERL, EOMI.  Right lid is slightly droopy compared to left, but patient is not sure if this is new  or not.  She has symetrical facial movement and normal sensation bilaterally.  Ears and Nose: No external deformity. CV: RRR, No M/G/R. No JVD. No thrill. No extra heart sounds. PULM: CTA B, no wheezes, crackles, rhonchi. No retractions. No resp. distress. No accessory muscle use. ABD: S, NT, ND, +BS. No rebound. No HSM. EXTR: No c/c/e NEURO Normal gait. Normal strength and sensation bilateral extremities, normal DTR bilaterally PSYCH: Normally interactive. Conversant. Not depressed or anxious appearing.  Calm demeanor.  Results for orders placed in visit on 10/04/11  GLUCOSE, POCT (MANUAL RESULT ENTRY)      Component Value Range   POC Glucose 136     Assessment and Plan: 1. Vision blurred  POCT glucose (manual entry)  2. URI (upper respiratory infection)  HYDROcodone-homatropine (HYCODAN)  5-1.5 MG/5ML syrup   Blurry vision mostly in the right eye without another explanation.  Her eye office- Dr. Emily Filbert- was kind to agree to see her now as a work- in.  She will proceed there now .  Given hycodan for cough associated with a URI and a note for work for today and tomorrow.   Patient (or parent if minor) instructed to return to clinic or call if not better in 2-3 day(s).

## 2011-10-06 ENCOUNTER — Telehealth: Payer: Self-pay

## 2011-10-06 ENCOUNTER — Encounter: Payer: Self-pay | Admitting: *Deleted

## 2011-10-06 NOTE — Telephone Encounter (Signed)
CAN WE EXTEND

## 2011-10-06 NOTE — Telephone Encounter (Signed)
Pt notified that rx is ready for pick up

## 2011-10-06 NOTE — Telephone Encounter (Signed)
Pt states that Dr Patsy Lager told her if she was not feeling better that she could call and we would extend her oow note.  Pt states that she is not feeling any better and would like an oow note extended.

## 2011-10-06 NOTE — Telephone Encounter (Signed)
OK for next 3 days - if pt feels like she needs longer will need recheck.

## 2011-10-07 ENCOUNTER — Other Ambulatory Visit: Payer: Self-pay | Admitting: Family Medicine

## 2011-10-07 MED ORDER — IPRATROPIUM BROMIDE 0.06 % NA SOLN: 2.0000 | Freq: Four times a day (QID) | NASAL | Status: DC

## 2011-10-09 ENCOUNTER — Telehealth: Payer: Self-pay | Admitting: *Deleted

## 2011-10-09 NOTE — Telephone Encounter (Signed)
HYPERTENSION - Marca Ancona, MD 09/17/2011 10:35 PM Signed  BP is high today but she did not take any of her medications today. I will have her check her BP every other day or so after taking medications and record. We will call for BP check in 2 wks.

## 2011-10-09 NOTE — Telephone Encounter (Signed)
Talked with pt. Pt states she had been sick and has not checked her BP. She is still sick. Pt states she will check her BP regularly after she feels better. Pt states she will call me in a couple of weeks with her BP readings.

## 2011-10-13 ENCOUNTER — Ambulatory Visit: Payer: No Typology Code available for payment source

## 2011-10-13 ENCOUNTER — Ambulatory Visit (INDEPENDENT_AMBULATORY_CARE_PROVIDER_SITE_OTHER): Payer: No Typology Code available for payment source | Admitting: Internal Medicine

## 2011-10-13 VITALS — BP 173/82 | HR 95 | Temp 98.2°F | Resp 20 | Ht <= 58 in | Wt 156.0 lb

## 2011-10-13 DIAGNOSIS — J40 Bronchitis, not specified as acute or chronic: Secondary | ICD-10-CM

## 2011-10-13 DIAGNOSIS — R059 Cough, unspecified: Secondary | ICD-10-CM

## 2011-10-13 DIAGNOSIS — R05 Cough: Secondary | ICD-10-CM

## 2011-10-13 LAB — POCT CBC
Granulocyte percent: 65.4 %G (ref 37–80)
HCT, POC: 36.5 % — AB (ref 37.7–47.9)
Hemoglobin: 11.8 g/dL — AB (ref 12.2–16.2)
Lymph, poc: 2.5 (ref 0.6–3.4)
MCH, POC: 25.8 pg — AB (ref 27–31.2)
MCHC: 32.3 g/dL (ref 31.8–35.4)
MCV: 79.9 fL — AB (ref 80–97)
MID (cbc): 0.6 (ref 0–0.9)
MPV: 8.2 fL (ref 0–99.8)
POC Granulocyte: 5.9 (ref 2–6.9)
POC LYMPH PERCENT: 27.7 %L (ref 10–50)
POC MID %: 6.9 %M (ref 0–12)
Platelet Count, POC: 257 10*3/uL (ref 142–424)
RBC: 4.57 M/uL (ref 4.04–5.48)
RDW, POC: 15.4 %
WBC: 9 10*3/uL (ref 4.6–10.2)

## 2011-10-13 MED ORDER — CEFDINIR 300 MG PO CAPS: 300.0000 mg | ORAL_CAPSULE | Freq: Two times a day (BID) | ORAL | Status: AC

## 2011-10-13 MED ORDER — HYDROCODONE-HOMATROPINE 5-1.5 MG/5ML PO SYRP: ORAL_SOLUTION | ORAL | Status: AC

## 2011-10-13 NOTE — Progress Notes (Signed)
Patient ID: Destiny Ballard MRN: [id number], DOB: 1951-06-12, 61 y.o. Date of Encounter: 10/13/2011, 10:27 AM  Primary Physician: No primary provider on file.  Chief Complaint:  Chief Complaint  Patient presents with  . Cough    taking mucinex 1200 mg, here a week ago no better  . Tinnitus    HPI: 61 y.o. year old female presents with 14 day history of continued cough. Has now developed nasal congestion, post nasal drip, sore throat, and sinus pressure. Afebrile. No chills. Nasal congestion thick and green/yellow. Cough is loose but not productive or associated with time of day. Ears feel full, leading to sensation of muffled hearing. She has tinnitus at baseline, but complains of it worsening when she develops a URI, this is typical for her. Has tried Hycodan, Atrovent nasal, and OTC cold preps without success. No GI complaints. Appetite slightly decreased secondary to decreased smell/taste. "I feel like some of this is my allergies acting up."  No sick contacts, recent antibiotics, or recent travels.   No leg trauma, sedentary periods, h/o cancer, or tobacco use.  Past Medical History  Diagnosis Date  . IHD (ischemic heart disease)     Prior stent to LAD in 2007  . Hypertension   . Hyperlipidemia   . Obesity   . Anxiety     Prior suicide attempt  . Depression   . S/P cardiac catheterization 2008    Stent patent  . Normal nuclear stress test 2010  . Cervical spondylosis      Home Meds: Prior to Admission medications   Medication Sig Start Date End Date Taking? Authorizing Provider  aspirin 81 MG tablet Take 81 mg by mouth daily.      Historical Provider, MD  Cholecalciferol (VITAMIN D3) 1000 UNITS CAPS Take by mouth daily.      Historical Provider, MD  ferrous sulfate 325 (65 FE) MG tablet Take 325 mg by mouth daily with breakfast.    Historical Provider, MD  fexofenadine (ALLEGRA) 180 MG tablet Take 180 mg by mouth daily.      Historical Provider, MD  FLUoxetine  (PROZAC) 40 MG capsule Take 40 mg by mouth daily.      Historical Provider, MD  fluticasone (VERAMYST) 27.5 MCG/SPRAY nasal spray Place 2 sprays into the nose daily.    Historical Provider, MD  HYDROcodone-homatropine (HYCODAN) 5-1.5 MG/5ML syrup Take 5 mLs by mouth every 8 (eight) hours as needed for cough. 10/04/11 10/14/11  Gwenlyn Found Copland, MD  ipratropium (ATROVENT) 0.06 % nasal spray Place 2 sprays into the nose 4 (four) times daily. 10/07/11 10/06/12  Morrell Riddle, PA-C  IRON PO Take by mouth daily.      Historical Provider, MD  metoprolol succinate (TOPROL-XL) 100 MG 24 hr tablet Take 1 tablet (100 mg total) by mouth daily. 09/17/11   Laurey Morale, MD  rosuvastatin (CRESTOR) 20 MG tablet Take 1 tablet (20 mg total) by mouth at bedtime. 09/17/11 09/16/12  Laurey Morale, MD  Simethicone (GAS-X PO) Take by mouth as needed.    Historical Provider, MD  valsartan-hydrochlorothiazide (DIOVAN HCT) 320-25 MG per tablet Take 1 tablet by mouth daily. 02/27/11 02/27/12  Rosalio Macadamia, NP  vitamin B-12 (CYANOCOBALAMIN) 1000 MCG tablet Take 1,000 mcg by mouth daily.      Historical Provider, MD  VITAMIN E PO Take by mouth daily.      Historical Provider, MD    Allergies:  Allergies  Allergen Reactions  . Prednisone  REACTION: mood swings, nightmares    History   Social History  . Marital Status: Married    Spouse Name: N/A    Number of Children: N/A  . Years of Education: N/A   Occupational History  . Not on file.   Social History Main Topics  . Smoking status: Never Smoker   . Smokeless tobacco: Never Used  . Alcohol Use: Yes     social  . Drug Use: No  . Sexually Active: Yes   Other Topics Concern  . Not on file   Social History Narrative  . No narrative on file     Review of Systems: Constitutional: negative for chills, fever, night sweats or weight changes Cardiovascular: negative for chest pain or palpitations Respiratory: negative for hemoptysis, wheezing, or shortness  of breath Abdominal: negative for abdominal pain, nausea, vomiting or diarrhea Dermatological: negative for rash Neurologic: negative for headache   Physical Exam: Blood pressure 173/82, pulse 95, temperature 98.2 F (36.8 C), resp. rate 20, height 4' 9.5" (1.461 m), weight 156 lb (70.761 kg)., Body mass index is 33.17 kg/(m^2). General: Well developed, well nourished, in no acute distress. Head: Normocephalic, atraumatic, eyes without discharge, sclera non-icteric, nares are congested. Bilateral auditory canals clear, TM's are without perforation, pearly grey with reflective cone of light bilaterally. Serous effusion bilaterally behind TM's. Maxillary sinus TTP. Oral cavity moist, dentition normal. Posterior pharynx with post nasal drip and mild erythema. No peritonsillar abscess or tonsillar exudate. Neck: Supple. No thyromegaly. Full ROM. No lymphadenopathy. Lungs: Coarse breath sounds bilaterally without wheezes, rales, or rhonchi. Breathing is unlabored.  Heart: RRR with S1 S2. No murmurs, rubs, or gallops appreciated. Msk:  Strength and tone normal for age. Extremities: No clubbing or cyanosis. No edema. Neuro: Alert and oriented X 3. Moves all extremities spontaneously. CNII-XII grossly in tact. Psych:  Responds to questions appropriately with a normal affect.   Labs: Results for orders placed in visit on 10/13/11  POCT CBC      Component Value Range   WBC 9.0  4.6 - 10.2 (K/uL)   Lymph, poc 2.5  0.6 - 3.4    POC LYMPH PERCENT 27.7  10 - 50 (%L)   MID (cbc) 0.6  0 - 0.9    POC MID % 6.9  0 - 12 (%M)   POC Granulocyte 5.9  2 - 6.9    Granulocyte percent 65.4  37 - 80 (%G)   RBC 4.57  4.04 - 5.48 (M/uL)   Hemoglobin 11.8 (*) 12.2 - 16.2 (g/dL)   HCT, POC 16.1 (*) 09.6 - 47.9 (%)   MCV 79.9 (*) 80 - 97 (fL)   MCH, POC 25.8 (*) 27 - 31.2 (pg)   MCHC 32.3  31.8 - 35.4 (g/dL)   RDW, POC 04.5     Platelet Count, POC 257  142 - 424 (K/uL)   MPV 8.2  0 - 99.8 (fL)     [facility]  reading (PRIMARY) by  Dr. Merla Riches. No active disease. Question mass right hilum. Breast implants noted  ASSESSMENT AND PLAN:  61 y.o. year old female with sinobronchitis -Omnicef 300 mg 1 po bid #20 no RF  -Hycodan #4oz 1 tsp po q 4-6 hours prn cough no RF SED -Continue Atrovent nasal -Await CXR over read, follow up pending -Mucinex -Tylenol/Motrin prn -Rest/fluids -RTC precautions -RTC 3-5 days if no improvement  Signed, Eula Listen, PA-C 10/13/2011 10:27 AM

## 2011-10-14 ENCOUNTER — Telehealth: Payer: Self-pay

## 2011-10-14 NOTE — Telephone Encounter (Signed)
Pt says she needs a note for today and the next 2 days if possible she said ryan told her she could have these if she needed it

## 2011-10-15 NOTE — Telephone Encounter (Signed)
Pt notified. Note up front for pick up

## 2011-10-15 NOTE — Telephone Encounter (Signed)
Ok to write the note

## 2011-10-16 ENCOUNTER — Ambulatory Visit (INDEPENDENT_AMBULATORY_CARE_PROVIDER_SITE_OTHER): Payer: No Typology Code available for payment source | Admitting: Family Medicine

## 2011-10-16 ENCOUNTER — Encounter: Payer: Self-pay | Admitting: Family Medicine

## 2011-10-16 VITALS — BP 144/79 | HR 85 | Temp 98.3°F | Resp 16 | Ht 59.5 in | Wt 157.6 lb

## 2011-10-16 DIAGNOSIS — K1120 Sialoadenitis, unspecified: Secondary | ICD-10-CM

## 2011-10-16 NOTE — Progress Notes (Signed)
61 yo woman who was seen 3 days ago with productive cough which has improved with the cefdinir.  Her x-rays and blood work looked okay at the time.  Yesterday, her right ear and right neck became problematic swelling in neck and some discomfort.    O:  Alert, nad

## 2011-10-16 NOTE — Patient Instructions (Signed)
Salivary Stone  Your exam shows you have a stone in one of your saliva glands. These small stones form around a mucous plug in the ducts of the glands and cause the saliva in the gland to be blocked. This makes the gland swollen and painful, especially when you eat. If repeated episodes occur, the gland can become infected. Sometimes these stones can be seen on x-ray.  Treatment includes stimulating the production of saliva to push the stone out. You should suck on a lemon or sour candies several times daily. Antibiotic medicine may be needed if the gland is infected. Increasing fluids, applying warm compresses to the swollen area 3-4 times daily, and massaging the gland from back to front may encourage drainage and passage of the stone.  Surgical treatment to remove the stone is sometimes necessary, so proper medical follow up is very important. Call your doctor for an appointment as recommended. Call right away if you have a high fever, severe headache, vomiting, uncontrolled pain, or other serious symptoms.  Document Released: 08/08/2004 Document Revised: 06/20/2011 Document Reviewed: 07/01/2005  [employer] Patient Information 2012 [employer]

## 2011-10-16 NOTE — Progress Notes (Signed)
This 61 year old woman was here on Sunday with a diagnosis of acute upper respiratory infection. 2 start on Ceftin air and she's done very well since then. She began with a productive cough last week and the phlegm has decreased significantly. She has no shortness of breath or chest pain.  Her right side of her face swelled up yesterday abruptly with some discomfort in the right ear. She's had no significant otalgia but she has chronic tinnitus bilaterally.  She's seen today with her husband.  Objective: No acute distress, alert and cooperative.  Chest is clear  Heart is regular no murmur  Neck is supple no adenopathy but she does have a very large right submandibular gland. On inspection side mouth BCE swelling underneath the tongue corresponding to the orifice of the submandibular salivary gland.  Assessment salivary gland stone  Plan lemon drops and continue the Ceftin ear warm compresses to the neck and return as necessary

## 2011-10-17 ENCOUNTER — Telehealth: Payer: Self-pay

## 2011-10-17 NOTE — Telephone Encounter (Signed)
Spoke with patient, she has just been using Tylenol  1000mg  q 4 hours and it has not been helpful.  She will try Motrin 800 tid and let us know how she does.

## 2011-10-17 NOTE — Telephone Encounter (Signed)
PT STATES SALIVA GLAND IS SWOLLEN AND SHE HAS A SALIVARY STONE. SHE IS IN A LOT OF PAIN AND CAN'T EAT OR SWALLOW. SHE REQUESTS SOMETHING BE CALLED IN FOR PAIN.

## 2011-10-17 NOTE — Telephone Encounter (Signed)
Has she tried motrin 800mg  tid?  If not please try - if yes can send in Ultram 50mg  tid prn pain #15.

## 2011-10-17 NOTE — Telephone Encounter (Signed)
Pt was in yesterday and saw dr Merla Riches she said, she would like to know if she could have some pain medication called in if possible

## 2011-10-19 ENCOUNTER — Telehealth: Payer: Self-pay

## 2011-10-19 NOTE — Telephone Encounter (Signed)
Letter written and patients husband notified.  In p/up drawer.

## 2011-10-19 NOTE — Telephone Encounter (Signed)
Patient came to pick up [facility] note from when pt was seen in office 3/31. She did not go to work through 4/5. May now return to work as of 4/6. Is this note ok to write?

## 2011-10-19 NOTE — Telephone Encounter (Signed)
Okay to remain out an additonal day

## 2011-10-19 NOTE — Telephone Encounter (Signed)
Is she ok to be out another day?  Has missed 3/31-4/4 already.

## 2011-11-01 ENCOUNTER — Telehealth: Payer: Self-pay | Admitting: Cardiology

## 2011-11-01 DIAGNOSIS — I10 Essential (primary) hypertension: Secondary | ICD-10-CM

## 2011-11-01 MED ORDER — AMLODIPINE BESYLATE 5 MG PO TABS: 5.0000 mg | ORAL_TABLET | Freq: Every day | ORAL | Status: DC

## 2011-11-01 NOTE — Telephone Encounter (Signed)
Pt called to give b/p readings that where done at [facility]  3/8  pm 164/75  3/12 pm 134/73  3/18 pm 184/81  3/31 pm 140/73  4/3 pm 144/79  4/15 pm 157/87  4/16 pm 140/81  4/18 pm 159/79

## 2011-11-01 NOTE — Telephone Encounter (Signed)
Patient took B/P  Medications in the AM and B/P readings in PM. Patient is aware that reading will be send to Dr. Alford Highland desktop.

## 2011-11-01 NOTE — Telephone Encounter (Signed)
BP is too high.  Start amlodipine 5 mg daily.  Continue to take BP every couple of days, call for repeat BP check in 2 wks.

## 2011-11-01 NOTE — Telephone Encounter (Signed)
Patient aware to start Amlodipine 5 mg daily and to check B/p every other day. Pt will call with B/P reading in 2 weeks after starting medication. Amlodipine 5 mg one tablet daily send electronically to [facility] [address] pharmacy, Patient aware.

## 2011-11-16 ENCOUNTER — Ambulatory Visit (INDEPENDENT_AMBULATORY_CARE_PROVIDER_SITE_OTHER): Payer: No Typology Code available for payment source | Admitting: Family Medicine

## 2011-11-16 VITALS — BP 118/60 | HR 69 | Temp 98.4°F | Resp 16 | Ht 59.5 in | Wt 153.4 lb

## 2011-11-16 DIAGNOSIS — E785 Hyperlipidemia, unspecified: Secondary | ICD-10-CM

## 2011-11-16 DIAGNOSIS — M25512 Pain in left shoulder: Secondary | ICD-10-CM

## 2011-11-16 DIAGNOSIS — I10 Essential (primary) hypertension: Secondary | ICD-10-CM

## 2011-11-16 DIAGNOSIS — R059 Cough, unspecified: Secondary | ICD-10-CM

## 2011-11-16 DIAGNOSIS — R05 Cough: Secondary | ICD-10-CM

## 2011-11-16 DIAGNOSIS — F329 Major depressive disorder, single episode, unspecified: Secondary | ICD-10-CM

## 2011-11-16 DIAGNOSIS — F32A Depression, unspecified: Secondary | ICD-10-CM

## 2011-11-16 DIAGNOSIS — M25532 Pain in left wrist: Secondary | ICD-10-CM

## 2011-11-16 DIAGNOSIS — I2510 Atherosclerotic heart disease of native coronary artery without angina pectoris: Secondary | ICD-10-CM

## 2011-11-16 DIAGNOSIS — J309 Allergic rhinitis, unspecified: Secondary | ICD-10-CM

## 2011-11-16 LAB — COMPREHENSIVE METABOLIC PANEL
ALT: 17 U/L (ref 0–35)
AST: 21 U/L (ref 0–37)
Albumin: 4.5 g/dL (ref 3.5–5.2)
Alkaline Phosphatase: 66 U/L (ref 39–117)
BUN: 25 mg/dL — ABNORMAL HIGH (ref 6–23)
CO2: 33 mEq/L — ABNORMAL HIGH (ref 19–32)
Calcium: 9.4 mg/dL (ref 8.4–10.5)
Chloride: 97 mEq/L (ref 96–112)
Creat: 0.63 mg/dL (ref 0.50–1.10)
Glucose, Bld: 128 mg/dL — ABNORMAL HIGH (ref 70–99)
Potassium: 3.6 mEq/L (ref 3.5–5.3)
Sodium: 138 mEq/L (ref 135–145)
Total Bilirubin: 0.4 mg/dL (ref 0.3–1.2)
Total Protein: 7.3 g/dL (ref 6.0–8.3)

## 2011-11-16 LAB — POCT CBC
Granulocyte percent: 59.7 %G (ref 37–80)
HCT, POC: 40.1 % (ref 37.7–47.9)
Hemoglobin: 13 g/dL (ref 12.2–16.2)
Lymph, poc: 2.7 (ref 0.6–3.4)
MCH, POC: 25.7 pg — AB (ref 27–31.2)
MCHC: 32.4 g/dL (ref 31.8–35.4)
MCV: 79.4 fL — AB (ref 80–97)
MID (cbc): 0.7 (ref 0–0.9)
MPV: 9.2 fL (ref 0–99.8)
POC Granulocyte: 5 (ref 2–6.9)
POC LYMPH PERCENT: 32.1 %L (ref 10–50)
POC MID %: 8.2 %M (ref 0–12)
Platelet Count, POC: 298 10*3/uL (ref 142–424)
RBC: 5.05 M/uL (ref 4.04–5.48)
RDW, POC: 13.9 %
WBC: 8.4 10*3/uL (ref 4.6–10.2)

## 2011-11-16 LAB — LIPID PANEL
Cholesterol: 148 mg/dL (ref 0–200)
HDL: 44 mg/dL (ref >39)
LDL Cholesterol: 77 mg/dL (ref 0–99)
Total CHOL/HDL Ratio: 3.4 Ratio
Triglycerides: 137 mg/dL (ref <150)
VLDL: 27 mg/dL (ref 0–40)

## 2011-11-16 MED ORDER — ALBUTEROL SULFATE HFA 108 (90 BASE) MCG/ACT IN AERS: 1.0000 | INHALATION_SPRAY | Freq: Four times a day (QID) | RESPIRATORY_TRACT | Status: DC | PRN

## 2011-11-16 MED ORDER — HYDROCODONE-HOMATROPINE 5-1.5 MG/5ML PO SYRP: 5.0000 mL | ORAL_SOLUTION | Freq: Three times a day (TID) | ORAL | Status: AC | PRN

## 2011-11-16 MED ORDER — FLUOXETINE HCL 40 MG PO CAPS: 40.0000 mg | ORAL_CAPSULE | Freq: Every day | ORAL | Status: DC

## 2011-11-16 NOTE — Progress Notes (Signed)
Subjective:    Patient ID: Destiny Ballard, female    DOB: 1950/07/17, 61 y.o.   MRN: [id number]  HPI Patients for follow up of multiple medical issues  1) HTN- BP's readings are elevated off medications which concerns patient.    2) (L) arm pain; history of fall in 2009 with shattered ulna and radius requiring surgical repair.                           Rotator cuff tear at the same time later equired surgical repair.                                    With damp or rainy weather arm aches.  Denies focal neurologic deficits  3) Allergic rhinitis- currently on Allegra and Atrovent NS.  Intermittant cough that is worse with activity.                                Exposed to second hand smoke;  Husband with "spot" on lung Works for post [employer] Review of Systems     Objective:   Physical Exam  Constitutional: She appears well-developed.  HENT:  Right Ear: External ear normal.  Left Ear: External ear normal.       Clear post nasal drainage  Neck: Neck supple.  Cardiovascular: Normal rate, regular rhythm and normal heart sounds.   Pulmonary/Chest: Effort normal and breath sounds normal.  Abdominal: Soft. There is no tenderness.  Lymphadenopathy:    She has no cervical adenopathy.  Neurological: She is alert. She displays normal reflexes. No cranial nerve deficit or sensory deficit. She exhibits normal muscle tone.  Reflex Scores:      Bicep reflexes are 1+ on the right side and 1+ on the left side.      Patellar reflexes are 1+ on the right side and 1+ on the left side. Skin: Skin is warm.    Results for orders placed in visit on 11/16/11  POCT CBC      Component Value Range   WBC 8.4  4.6 - 10.2 (K/uL)   Lymph, poc 2.7  0.6 - 3.4    POC LYMPH PERCENT 32.1  10 - 50 (%L)   MID (cbc) 0.7  0 - 0.9    POC MID % 8.2  0 - 12 (%M)   POC Granulocyte 5.0  2 - 6.9    Granulocyte percent 59.7  37 - 80 (%G)   RBC 5.05  4.04 - 5.48 (M/uL)   Hemoglobin 13.0  12.2 - 16.2 (g/dL)   HCT, POC 78.2  95.6 - 47.9 (%)   MCV 79.4 (*) 80 - 97 (fL)   MCH, POC 25.7 (*) 27 - 31.2 (pg)   MCHC 32.4  31.8 - 35.4 (g/dL)   RDW, POC 21.3     Platelet Count, POC 298  142 - 424 (K/uL)   MPV 9.2  0 - 99.8 (fL)       Assessment & Plan:   1. HTN (hypertension)  Comprehensive metabolic panel, Lipid panel, POCT CBC  2. CAD (coronary artery disease)    3. Dyslipidemia    4. Allergic rhinitis    5. Cough  albuterol (PROVENTIL HFA;VENTOLIN HFA) 108 (90 BASE) MCG/ACT inhaler, HYDROcodone-homatropine (HYCODAN) 5-1.5 MG/5ML syrup  6. Depression  FLUoxetine (PROZAC) 40  MG capsule  7. Wrist arthralgia, left    8. Shoulder arthralgia, left

## 2011-11-20 ENCOUNTER — Other Ambulatory Visit: Payer: Self-pay | Admitting: Family Medicine

## 2011-11-20 DIAGNOSIS — R739 Hyperglycemia, unspecified: Secondary | ICD-10-CM

## 2011-11-23 ENCOUNTER — Telehealth: Payer: Self-pay

## 2011-11-23 NOTE — Telephone Encounter (Signed)
Patient returning call. States it may have been about labs. CB# [phone] (cell)

## 2011-11-25 NOTE — Telephone Encounter (Signed)
patient notified about lab results. patient is to RTC for HgA1c. Per Dr. Hal Hope order already put in.

## 2011-11-28 ENCOUNTER — Other Ambulatory Visit: Payer: No Typology Code available for payment source

## 2011-12-03 ENCOUNTER — Ambulatory Visit: Payer: No Typology Code available for payment source | Admitting: Family Medicine

## 2011-12-03 ENCOUNTER — Other Ambulatory Visit (INDEPENDENT_AMBULATORY_CARE_PROVIDER_SITE_OTHER): Payer: No Typology Code available for payment source | Admitting: Family Medicine

## 2011-12-03 VITALS — BP 153/78 | HR 98 | Temp 98.1°F | Resp 16 | Ht 59.25 in | Wt 156.8 lb

## 2011-12-03 DIAGNOSIS — R739 Hyperglycemia, unspecified: Secondary | ICD-10-CM

## 2011-12-03 DIAGNOSIS — E119 Type 2 diabetes mellitus without complications: Secondary | ICD-10-CM

## 2011-12-03 DIAGNOSIS — E8881 Metabolic syndrome: Secondary | ICD-10-CM

## 2011-12-03 DIAGNOSIS — I10 Essential (primary) hypertension: Secondary | ICD-10-CM

## 2011-12-03 DIAGNOSIS — E785 Hyperlipidemia, unspecified: Secondary | ICD-10-CM

## 2011-12-03 LAB — POCT GLYCOSYLATED HEMOGLOBIN (HGB A1C): Hemoglobin A1C: 6.4

## 2011-12-03 MED ORDER — METFORMIN HCL 500 MG PO TABS: 500.0000 mg | ORAL_TABLET | Freq: Every day | ORAL | Status: DC

## 2011-12-03 NOTE — Progress Notes (Signed)
Patient returns for lab result  Results for orders placed in visit on 12/03/11  POCT GLYCOSYLATED HEMOGLOBIN (HGB A1C)      Component Value Range   Hemoglobin A1C 6.4

## 2011-12-05 ENCOUNTER — Ambulatory Visit (INDEPENDENT_AMBULATORY_CARE_PROVIDER_SITE_OTHER): Payer: No Typology Code available for payment source | Admitting: Family Medicine

## 2011-12-05 VITALS — BP 114/68 | HR 73 | Temp 98.2°F | Resp 18 | Ht 59.5 in | Wt 157.0 lb

## 2011-12-05 DIAGNOSIS — R197 Diarrhea, unspecified: Secondary | ICD-10-CM

## 2011-12-05 DIAGNOSIS — T383X5A Adverse effect of insulin and oral hypoglycemic [antidiabetic] drugs, initial encounter: Secondary | ICD-10-CM

## 2011-12-05 DIAGNOSIS — E8881 Metabolic syndrome: Secondary | ICD-10-CM

## 2011-12-05 LAB — POCT CBC
Granulocyte percent: 58 %G (ref 37–80)
HCT, POC: 34.4 % — AB (ref 37.7–47.9)
Hemoglobin: 11.1 g/dL — AB (ref 12.2–16.2)
Lymph, poc: 2.6 (ref 0.6–3.4)
MCH, POC: 26.4 pg — AB (ref 27–31.2)
MCHC: 32.3 g/dL (ref 31.8–35.4)
MCV: 81.9 fL (ref 80–97)
MID (cbc): 0.7 (ref 0–0.9)
MPV: 9.2 fL (ref 0–99.8)
POC Granulocyte: 4.6 (ref 2–6.9)
POC LYMPH PERCENT: 32.7 %L (ref 10–50)
POC MID %: 9.3 %M (ref 0–12)
Platelet Count, POC: 278 10*3/uL (ref 142–424)
RBC: 4.2 M/uL (ref 4.04–5.48)
RDW, POC: 14.7 %
WBC: 7.9 10*3/uL (ref 4.6–10.2)

## 2011-12-05 MED ORDER — ONDANSETRON 4 MG PO TBDP
8.0000 mg | ORAL_TABLET | Freq: Once | ORAL | Status: AC
  Administered 2011-12-05: 8 mg via ORAL

## 2011-12-05 NOTE — Progress Notes (Signed)
  Subjective:    Patient ID: Destiny Ballard, female    DOB: 1950-11-01, 61 y.o.   MRN: [id number]  HPI  Patient presents to clinic complaining of nausea without emesis and 3 episodes of loose stools. Today. Started on Metformin yesterday.  Denies fever or chills Denies sick contacts  Review of Systems     Objective:   Physical Exam  Constitutional: She appears well-developed.  HENT:  Mouth/Throat: Oropharynx is clear and moist.  Neck: Neck supple.  Cardiovascular: Normal rate, regular rhythm and normal heart sounds.   Pulmonary/Chest: Effort normal and breath sounds normal.  Abdominal: Soft. There is Tenderness: (L) > (R) lower quandrant without rebound or guarding .       Increased BS       Assessment & Plan:  Medication side effect Diarrhea secondary to Metformin Insulin resistance  Zofran ODT 8 mg Hold Metformin Stressed lifestyle modification to treat insulin resistance If BS's climb will use Metformin XR or Januvia.

## 2011-12-08 ENCOUNTER — Encounter: Payer: Self-pay | Admitting: Family Medicine

## 2011-12-08 NOTE — Progress Notes (Signed)
  Subjective:    Patient ID: Destiny Ballard, female    DOB: 1950/11/06, 61 y.o.   MRN: [id number]  HPI Presents in follow up of elevated BS noted on previous labs.  Denies polyuria, polydipsia or weight loss  FH of DM   Review of Systems     Objective:   Physical Exam  Constitutional: She appears well-developed.  Cardiovascular: Normal rate and regular rhythm.   Pulmonary/Chest: Effort normal and breath sounds normal.  Neurological: She is alert.  Skin: Skin is warm.     Results for orders placed in visit on 12/03/11  POCT GLYCOSYLATED HEMOGLOBIN (HGB A1C)      Component Value Range   Hemoglobin A1C 6.4          Assessment & Plan:  Insulin resistance/glucose intolerance   Metformin 500 mg QD Exercise Rx; goal weight loss is 5 pounds at next OV Food choices/portion control discussed.  Follow up in 3 months

## 2011-12-10 ENCOUNTER — Emergency Department (HOSPITAL_COMMUNITY): Payer: No Typology Code available for payment source

## 2011-12-10 ENCOUNTER — Observation Stay (HOSPITAL_COMMUNITY)
Admission: EM | Admit: 2011-12-10 | Discharge: 2011-12-11 | DRG: 313 | Disposition: A | Discharge status: Home / Self Care | Payer: No Typology Code available for payment source | Source: Ambulatory Visit | Attending: Cardiology | Admitting: Cardiology

## 2011-12-10 ENCOUNTER — Encounter (HOSPITAL_COMMUNITY): Payer: Self-pay | Admitting: Emergency Medicine

## 2011-12-10 ENCOUNTER — Ambulatory Visit (INDEPENDENT_AMBULATORY_CARE_PROVIDER_SITE_OTHER): Payer: No Typology Code available for payment source | Admitting: Family Medicine

## 2011-12-10 VITALS — BP 169/84 | HR 116 | Temp 98.8°F | Resp 18 | Ht 59.5 in | Wt 156.0 lb

## 2011-12-10 DIAGNOSIS — I10 Essential (primary) hypertension: Secondary | ICD-10-CM

## 2011-12-10 DIAGNOSIS — R0789 Other chest pain: Principal | ICD-10-CM | POA: Diagnosis present

## 2011-12-10 DIAGNOSIS — D509 Iron deficiency anemia, unspecified: Secondary | ICD-10-CM | POA: Diagnosis present

## 2011-12-10 DIAGNOSIS — Z9861 Coronary angioplasty status: Secondary | ICD-10-CM

## 2011-12-10 DIAGNOSIS — I2510 Atherosclerotic heart disease of native coronary artery without angina pectoris: Secondary | ICD-10-CM

## 2011-12-10 DIAGNOSIS — E785 Hyperlipidemia, unspecified: Secondary | ICD-10-CM

## 2011-12-10 DIAGNOSIS — K219 Gastro-esophageal reflux disease without esophagitis: Secondary | ICD-10-CM

## 2011-12-10 DIAGNOSIS — M47812 Spondylosis without myelopathy or radiculopathy, cervical region: Secondary | ICD-10-CM | POA: Diagnosis present

## 2011-12-10 DIAGNOSIS — F329 Major depressive disorder, single episode, unspecified: Secondary | ICD-10-CM | POA: Diagnosis present

## 2011-12-10 DIAGNOSIS — F411 Generalized anxiety disorder: Secondary | ICD-10-CM | POA: Diagnosis present

## 2011-12-10 DIAGNOSIS — F32A Depression, unspecified: Secondary | ICD-10-CM

## 2011-12-10 DIAGNOSIS — F3289 Other specified depressive episodes: Secondary | ICD-10-CM | POA: Diagnosis present

## 2011-12-10 DIAGNOSIS — R059 Cough, unspecified: Secondary | ICD-10-CM

## 2011-12-10 DIAGNOSIS — R002 Palpitations: Secondary | ICD-10-CM | POA: Diagnosis present

## 2011-12-10 DIAGNOSIS — E860 Dehydration: Secondary | ICD-10-CM | POA: Diagnosis present

## 2011-12-10 DIAGNOSIS — R079 Chest pain, unspecified: Secondary | ICD-10-CM

## 2011-12-10 DIAGNOSIS — I200 Unstable angina: Secondary | ICD-10-CM

## 2011-12-10 DIAGNOSIS — R05 Cough: Secondary | ICD-10-CM

## 2011-12-10 HISTORY — DX: Gastro-esophageal reflux disease without esophagitis: K21.9

## 2011-12-10 HISTORY — DX: Insulin resistance, unspecified: E88.819

## 2011-12-10 HISTORY — DX: Metabolic syndrome: E88.81

## 2011-12-10 HISTORY — DX: Atherosclerotic heart disease of native coronary artery without angina pectoris: I25.10

## 2011-12-10 HISTORY — DX: Iron deficiency anemia, unspecified: D50.9

## 2011-12-10 LAB — COMPREHENSIVE METABOLIC PANEL
ALT: 20 U/L (ref 0–35)
AST: 20 U/L (ref 0–37)
Albumin: 4 g/dL (ref 3.5–5.2)
Alkaline Phosphatase: 74 U/L (ref 39–117)
BUN: 25 mg/dL — ABNORMAL HIGH (ref 6–23)
CO2: 26 mEq/L (ref 19–32)
Calcium: 9.6 mg/dL (ref 8.4–10.5)
Chloride: 101 mEq/L (ref 96–112)
Creatinine, Ser: 0.6 mg/dL (ref 0.50–1.10)
GFR calc Af Amer: >90 mL/min (ref >90)
GFR calc non Af Amer: >90 mL/min (ref >90)
Glucose, Bld: 125 mg/dL — ABNORMAL HIGH (ref 70–99)
Potassium: 3.5 mEq/L (ref 3.5–5.1)
Sodium: 139 mEq/L (ref 135–145)
Total Bilirubin: 0.3 mg/dL (ref 0.3–1.2)
Total Protein: 7.4 g/dL (ref 6.0–8.3)

## 2011-12-10 LAB — DIFFERENTIAL
Basophils Absolute: 0 10*3/uL (ref 0.0–0.1)
Basophils Relative: 0 % (ref 0–1)
Eosinophils Absolute: 0.1 10*3/uL (ref 0.0–0.7)
Eosinophils Relative: 0 % (ref 0–5)
Lymphocytes Relative: 19 % (ref 12–46)
Lymphs Abs: 2.3 10*3/uL (ref 0.7–4.0)
Monocytes Absolute: 0.8 10*3/uL (ref 0.1–1.0)
Monocytes Relative: 6 % (ref 3–12)
Neutro Abs: 8.9 10*3/uL — ABNORMAL HIGH (ref 1.7–7.7)
Neutrophils Relative %: 74 % (ref 43–77)

## 2011-12-10 LAB — CBC
HCT: 34.7 % — ABNORMAL LOW (ref 36.0–46.0)
Hemoglobin: 11.8 g/dL — ABNORMAL LOW (ref 12.0–15.0)
MCH: 26.8 pg (ref 26.0–34.0)
MCHC: 34 g/dL (ref 30.0–36.0)
MCV: 78.9 fL (ref 78.0–100.0)
Platelets: 223 10*3/uL (ref 150–400)
RBC: 4.4 MIL/uL (ref 3.87–5.11)
RDW: 14 % (ref 11.5–15.5)
WBC: 12 10*3/uL — ABNORMAL HIGH (ref 4.0–10.5)

## 2011-12-10 LAB — GLUCOSE, CAPILLARY: Glucose-Capillary: 103 mg/dL — ABNORMAL HIGH (ref 70–99)

## 2011-12-10 LAB — TROPONIN I: Troponin I: <0.3 ng/mL (ref <0.30)

## 2011-12-10 MED ORDER — AMLODIPINE BESYLATE 5 MG PO TABS
5.0000 mg | ORAL_TABLET | Freq: Every day | ORAL | Status: DC
  Filled 2011-12-10: qty 1

## 2011-12-10 MED ORDER — FERROUS SULFATE 325 (65 FE) MG PO TABS
325.0000 mg | ORAL_TABLET | Freq: Every day | ORAL | Status: DC
  Administered 2011-12-11: 325 mg via ORAL
  Filled 2011-12-10 (×2): qty 1

## 2011-12-10 MED ORDER — HYDROCHLOROTHIAZIDE 25 MG PO TABS
25.0000 mg | ORAL_TABLET | Freq: Every day | ORAL | Status: DC
  Administered 2011-12-11: 25 mg via ORAL
  Filled 2011-12-10: qty 1

## 2011-12-10 MED ORDER — ALBUTEROL SULFATE HFA 108 (90 BASE) MCG/ACT IN AERS: 1.0000 | INHALATION_SPRAY | Freq: Four times a day (QID) | RESPIRATORY_TRACT | Status: DC | PRN

## 2011-12-10 MED ORDER — ENOXAPARIN SODIUM 40 MG/0.4ML ~~LOC~~ SOLN
40.0000 mg | SUBCUTANEOUS | Status: DC
  Filled 2011-12-10 (×2): qty 0.4

## 2011-12-10 MED ORDER — VITAMIN B-12 1000 MCG PO TABS
1000.0000 ug | ORAL_TABLET | Freq: Every day | ORAL | Status: DC
  Administered 2011-12-11: 1000 ug via ORAL
  Filled 2011-12-10: qty 1

## 2011-12-10 MED ORDER — FLUTICASONE PROPIONATE 50 MCG/ACT NA SUSP
2.0000 | Freq: Every day | NASAL | Status: DC
  Administered 2011-12-11: 2 via NASAL
  Filled 2011-12-10: qty 16

## 2011-12-10 MED ORDER — INSULIN ASPART 100 UNIT/ML ~~LOC~~ SOLN: 0.0000 [IU] | Freq: Every day | SUBCUTANEOUS | Status: DC

## 2011-12-10 MED ORDER — FLUOXETINE HCL 20 MG PO CAPS
40.0000 mg | ORAL_CAPSULE | Freq: Every day | ORAL | Status: DC
  Administered 2011-12-11 (×2): 40 mg via ORAL
  Filled 2011-12-10 (×2): qty 2

## 2011-12-10 MED ORDER — ASPIRIN 81 MG PO CHEW
81.0000 mg | CHEWABLE_TABLET | Freq: Every day | ORAL | Status: DC
  Administered 2011-12-11 (×2): 81 mg via ORAL
  Filled 2011-12-10 (×2): qty 1

## 2011-12-10 MED ORDER — INSULIN ASPART 100 UNIT/ML ~~LOC~~ SOLN
0.0000 [IU] | Freq: Three times a day (TID) | SUBCUTANEOUS | Status: DC
Start: 2011-12-11 — End: 2011-12-11

## 2011-12-10 MED ORDER — VALSARTAN-HYDROCHLOROTHIAZIDE 320-25 MG PO TABS: 1.0000 | ORAL_TABLET | Freq: Every day | ORAL | Status: DC

## 2011-12-10 MED ORDER — METOPROLOL SUCCINATE ER 100 MG PO TB24
100.0000 mg | ORAL_TABLET | Freq: Every day | ORAL | Status: DC
  Administered 2011-12-11: 100 mg via ORAL
  Filled 2011-12-10: qty 1

## 2011-12-10 MED ORDER — ATORVASTATIN CALCIUM 40 MG PO TABS
40.0000 mg | ORAL_TABLET | Freq: Every day | ORAL | Status: DC
  Administered 2011-12-11: 40 mg via ORAL
  Filled 2011-12-10: qty 1

## 2011-12-10 MED ORDER — SODIUM CHLORIDE 0.9 % IJ SOLN
3.0000 mL | Freq: Two times a day (BID) | INTRAMUSCULAR | Status: DC
  Administered 2011-12-11: 3 mL via INTRAVENOUS

## 2011-12-10 MED ORDER — LORATADINE 10 MG PO TABS
10.0000 mg | ORAL_TABLET | Freq: Every day | ORAL | Status: DC
  Administered 2011-12-11: 10 mg via ORAL
  Filled 2011-12-10: qty 1

## 2011-12-10 MED ORDER — IRBESARTAN 300 MG PO TABS
300.0000 mg | ORAL_TABLET | Freq: Every day | ORAL | Status: DC
  Administered 2011-12-11: 300 mg via ORAL
  Filled 2011-12-10 (×2): qty 1

## 2011-12-10 MED ORDER — IPRATROPIUM BROMIDE 0.06 % NA SOLN
2.0000 | Freq: Four times a day (QID) | NASAL | Status: DC
  Filled 2011-12-10: qty 15

## 2011-12-10 NOTE — ED Notes (Signed)
Patient reports she is pain free on arrival to ED. States she continues to feel like she has palpitations. Denies SOB.

## 2011-12-10 NOTE — Progress Notes (Signed)
  Subjective:    Patient ID: Destiny Ballard, female    DOB: 12/04/1950, 61 y.o.   MRN: [id number]  HPI  Patient presents to clinic after developing palpitations 2:30 PM.   Patient states with the palpitations she became very tremulous then subsequently developed indigestion, substernal CP and (L) neck pain.  Patient acknowledges nausea and cold chills with symptoms.  Cardiac risk factors-Known cardiac disease(stent placed per patient in the early 2000's), Insulin Resistance, HTN, dyslipidemia and postmenopausal  With prior event patient did not have any chest symtpoms  Review of Systems     Objective:   Physical Exam  Constitutional: She appears well-developed.  HENT:  Head: Normocephalic and atraumatic.  Eyes:       No bruits  Neck: Neck supple. No JVD present. No thyromegaly present.  Cardiovascular: Normal rate, regular rhythm, normal heart sounds and intact distal pulses.   Pulmonary/Chest: Effort normal and breath sounds normal.  Abdominal: Soft. Bowel sounds are normal. There is no tenderness.  Lymphadenopathy:    She has no cervical adenopathy.  Neurological: She is alert.  Skin: Skin is warm.    EKG- Sinus tachycardia      Assessment & Plan:   1. Unstable angina   2. HYPERLIPIDEMIA   3. HYPERTENSION   4. CORONARY ARTERY DISEASE   5. GERD    O2 2L ASA Attempted IV X 2 without success Dr. Larina Bras aware of transfer

## 2011-12-10 NOTE — ED Notes (Signed)
Spoke to pharmacy with medications will be reschedule, pt has bed assignment with report will be giving to receiving RN about pt reschedule of medications.

## 2011-12-10 NOTE — H&P (Signed)
Cardiology History and Physical  DOOLITTLE, Harrel Lemon, MD, MD  History of Present Illness (and review of medical records): Destiny Ballard is a 61 y.o. female who presents for evaluation of chest pain.  She has history of CAD s/p LAD PCI with Normal stress myoview in 2010. Lexiscan myoview in 8/12 showed EF 76%, no ischemia or infarction. She was recently seen in cardiology clinic with uncontrolled BP.  She states that today she developed chest pain that was dull.  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  Pain was associated with nausea, palpitations and neck pain.  Pain would improve with rest and return as she kept on working.  She was brought to ED by EMS from work.  She is now chest pain free after NTG and ASA.  Previous diagnostic testing for coronary artery disease includes: cardiac catheterization, echocardiogram and persantine thallium. Previous history of cardiac disease includes Angina Coronary Artery Disease Coronary Artery Stent. Coronary artery disease risk factors include: dyslipidemia and hypertension. Patient denies history of CABG and cardiomyopathy.  Review of Systems Further review of systems was otherwise negative other than stated in HPI.  Patient Active Problem List  Diagnoses Date Noted  . Chest pain 12/10/2011  . HYPERLIPIDEMIA 05/11/2010  . MORBID OBESITY 05/11/2010  . HYPERTENSION 05/11/2010  . HEADACHE, CHRONIC 05/11/2010  . COUGH 05/11/2010  . ANEMIA-IRON DEFICIENCY 06/28/2008  . CORONARY ARTERY DISEASE 06/28/2008  . GERD 06/28/2008   Past Medical History  Diagnosis Date  . IHD (ischemic heart disease)     Prior stent to LAD in 2007  . Hypertension   . Hyperlipidemia   . Obesity   . Anxiety     Prior suicide attempt  . Depression   . S/P cardiac catheterization 2008    Stent patent  . Normal nuclear stress test 2010  . Cervical spondylosis     Past Surgical History  Procedure Date  . Cardiac  catheterization 06/17/2007    NORMAL. EF 60%  . Cardiac catheterization 07/24/2005    NORMAL. EF 60-70%  . Coronary stent placement 07/2005    LEFT ANTERIOR DESCENDING  . Tubal ligation   . Tonsillectomy and adenoidectomy   . Childbirth     X3  . Breast enhancement surgery   . Cervical spondylosis     SINGLE LEVEL FUSION     (Not in a hospital admission) Allergies  Allergen Reactions  . Prednisone     REACTION: mood swings, nightmares    History  Substance Use Topics  . Smoking status: Never Smoker   . Smokeless tobacco: Never Used  . Alcohol Use: Yes     social    Family History  Problem Relation Age of Onset  . Heart attack Mother   . Diabetes Mother   . Cancer Mother   . Suicidality Father      Objective: Patient Vitals for the past 8 hrs:  BP Temp Temp src Pulse Resp SpO2  12/10/11 1941 150/68 mmHg 97.7 F (36.5 C) Oral - 18  97 %   General Appearance:    Alert, cooperative, no distress, appears stated age  Head:    Normocephalic, without obvious abnormality, atraumatic  Eyes:     PERRL, EOMI, anicteric sclerae  Neck:   Supple, no carotid bruit or JVD  Lungs:     Clear to auscultation bilaterally, respirations unlabored  Heart:    Regular rate and rhythm, S1 and S2 normal, no murmur  Abdomen:     Soft, non-tender, normoactive bowel sounds  Extremities:   Extremities normal, atraumatic, no cyanosis or edema  Pulses:   2+ and symmetric all extremities  Skin:   no rashes or lesions  Neurologic:   No focal deficits. AAO x3   Results for orders placed during the hospital encounter of 12/10/11 (from the past 48 hour(s))  TROPONIN I     Status: Normal   Collection Time   12/10/11  7:32 PM      Component Value Range Comment   Troponin I <0.30  <0.30 (ng/mL)   CBC     Status: Abnormal   Collection Time   12/10/11  7:35 PM      Component Value Range Comment   WBC 12.0 (*) 4.0 - 10.5 (K/uL)    RBC 4.40  3.87 - 5.11 (MIL/uL)    Hemoglobin 11.8 (*) 12.0 - 15.0  (g/dL)    HCT 30.8 (*) 65.7 - 46.0 (%)    MCV 78.9  78.0 - 100.0 (fL)    MCH 26.8  26.0 - 34.0 (pg)    MCHC 34.0  30.0 - 36.0 (g/dL)    RDW 84.6  96.2 - 95.2 (%)    Platelets 223  150 - 400 (K/uL)   DIFFERENTIAL     Status: Abnormal   Collection Time   12/10/11  7:35 PM      Component Value Range Comment   Neutrophils Relative 74  43 - 77 (%)    Neutro Abs 8.9 (*) 1.7 - 7.7 (K/uL)    Lymphocytes Relative 19  12 - 46 (%)    Lymphs Abs 2.3  0.7 - 4.0 (K/uL)    Monocytes Relative 6  3 - 12 (%)    Monocytes Absolute 0.8  0.1 - 1.0 (K/uL)    Eosinophils Relative 0  0 - 5 (%)    Eosinophils Absolute 0.1  0.0 - 0.7 (K/uL)    Basophils Relative 0  0 - 1 (%)    Basophils Absolute 0.0  0.0 - 0.1 (K/uL)   COMPREHENSIVE METABOLIC PANEL     Status: Abnormal   Collection Time   12/10/11  7:35 PM      Component Value Range Comment   Sodium 139  135 - 145 (mEq/L)    Potassium 3.5  3.5 - 5.1 (mEq/L)    Chloride 101  96 - 112 (mEq/L)    CO2 26  19 - 32 (mEq/L)    Glucose, Bld 125 (*) 70 - 99 (mg/dL)    BUN 25 (*) 6 - 23 (mg/dL)    Creatinine, Ser 8.41  0.50 - 1.10 (mg/dL)    Calcium 9.6  8.4 - 10.5 (mg/dL)    Total Protein 7.4  6.0 - 8.3 (g/dL)    Albumin 4.0  3.5 - 5.2 (g/dL)    AST 20  0 - 37 (U/L)    ALT 20  0 - 35 (U/L)    Alkaline Phosphatase 74  39 - 117 (U/L)    Total Bilirubin 0.3  0.3 - 1.2 (mg/dL)    GFR calc non Af Amer >90  >90 (mL/min)    GFR calc Af Amer >90  >90 (mL/min)    Dg Chest Port 1 View  12/10/2011  *RADIOLOGY REPORT*  Clinical Data: 61 year old female with chest pain dizziness and shortness of breath.  PORTABLE CHEST - 1 VIEW  Comparison: 10/13/2011 and earlier.  Findings: Portable semi upright AP view at 2009 hours.  Stable lung volumes.  Cardiac size and mediastinal contours are within normal limits.  No pneumothorax, pulmonary edema, pleural effusion or acute pulmonary opacity.  Partially calcified probable right breast implant re-identified.  Partially visible  cervical ACDF hardware.  IMPRESSION: No acute cardiopulmonary abnormality.  Original Report Authenticated By: Ulla Potash III, M.D.    ECG:  Sinus rhythm, no acute ischemic changes  Assessment: Chest pain with known hx of CAD.  Patient has had history of atypical chest pain and uncontrolled blood pressure.   Plan:  1. Admit to Cardiology, Telemetry Unit 2. Repeat ekg on admit, prn chest pain or arrythmia 3. Trend cardiac biomarkers 4. Medical management to include ASA, home BP meds, Statin, NTG prn 5. NPO in am for possible noninvasive assessment.  Likely also titration of BP meds.

## 2011-12-10 NOTE — ED Notes (Signed)
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 Sent here by urgent for further eval of chest pain. Hx of HTN and 1 stent in 2004. Pain free at present. Received 2 nitro SL, 324mg  ASA PTA. 20g R hand. EKG for EMS sinus tachy.

## 2011-12-10 NOTE — ED Notes (Signed)
Pt resting quietly no s/s of any pain or distress observed. Pt denies any pain or complaints at this time, family at bedside. Drinks given to family at bedside, meal tray given to pt to be eaten after blood sugar testing is complete. Plan of care is updated with verbal understanding, report will be called to floor for admission stay. Pt awaiting transport and will continue to monitor pt.

## 2011-12-10 NOTE — ED Notes (Signed)
Admitting MD at bedside, pt awaiting inpt beds assignment.  

## 2011-12-11 ENCOUNTER — Inpatient Hospital Stay (HOSPITAL_COMMUNITY): Payer: No Typology Code available for payment source

## 2011-12-11 ENCOUNTER — Encounter (HOSPITAL_COMMUNITY): Payer: Self-pay | Admitting: Physician Assistant

## 2011-12-11 DIAGNOSIS — I2510 Atherosclerotic heart disease of native coronary artery without angina pectoris: Secondary | ICD-10-CM

## 2011-12-11 LAB — CARDIAC PANEL(CRET KIN+CKTOT+MB+TROPI)
CK, MB: 2.3 ng/mL (ref 0.3–4.0)
CK, MB: 2 ng/mL (ref 0.3–4.0)
CK, MB: 1.8 ng/mL (ref 0.3–4.0)
Relative Index: INVALID (ref 0.0–2.5)
Relative Index: INVALID (ref 0.0–2.5)
Relative Index: INVALID (ref 0.0–2.5)
Total CK: 59 U/L (ref 7–177)
Total CK: 59 U/L (ref 7–177)
Total CK: 58 U/L (ref 7–177)
Troponin I: <0.3 ng/mL (ref <0.30)
Troponin I: <0.3 ng/mL (ref <0.30)
Troponin I: <0.3 ng/mL (ref <0.30)

## 2011-12-11 LAB — BASIC METABOLIC PANEL
BUN: 20 mg/dL (ref 6–23)
CO2: 29 mEq/L (ref 19–32)
Calcium: 9.3 mg/dL (ref 8.4–10.5)
Chloride: 101 mEq/L (ref 96–112)
Creatinine, Ser: 0.64 mg/dL (ref 0.50–1.10)
GFR calc Af Amer: >90 mL/min (ref >90)
GFR calc non Af Amer: >90 mL/min (ref >90)
Glucose, Bld: 131 mg/dL — ABNORMAL HIGH (ref 70–99)
Potassium: 3.1 mEq/L — ABNORMAL LOW (ref 3.5–5.1)
Sodium: 140 mEq/L (ref 135–145)

## 2011-12-11 LAB — GLUCOSE, CAPILLARY
Glucose-Capillary: 112 mg/dL — ABNORMAL HIGH (ref 70–99)
Glucose-Capillary: 170 mg/dL — ABNORMAL HIGH (ref 70–99)

## 2011-12-11 LAB — PROTIME-INR
INR: 0.97 (ref 0.00–1.49)
Prothrombin Time: 13.1 seconds (ref 11.6–15.2)

## 2011-12-11 MED ORDER — AMLODIPINE BESYLATE 10 MG PO TABS: 10.0000 mg | ORAL_TABLET | Freq: Every day | ORAL | Status: DC

## 2011-12-11 MED ORDER — TECHNETIUM TC 99M TETROFOSMIN IV KIT
30.0000 | PACK | Freq: Once | INTRAVENOUS | Status: AC | PRN
  Administered 2011-12-11: 30 via INTRAVENOUS

## 2011-12-11 MED ORDER — AMLODIPINE BESYLATE 10 MG PO TABS
10.0000 mg | ORAL_TABLET | Freq: Every day | ORAL | Status: DC
  Administered 2011-12-11: 10 mg via ORAL
  Filled 2011-12-11: qty 1

## 2011-12-11 MED ORDER — TECHNETIUM TC 99M TETROFOSMIN IV KIT
10.0000 | PACK | Freq: Once | INTRAVENOUS | Status: AC | PRN
  Administered 2011-12-11: 10 via INTRAVENOUS

## 2011-12-11 MED ORDER — NITROGLYCERIN 0.4 MG SL SUBL: 0.4000 mg | SUBLINGUAL_TABLET | SUBLINGUAL | Status: DC | PRN

## 2011-12-11 MED ORDER — POTASSIUM CHLORIDE CRYS ER 20 MEQ PO TBCR
40.0000 meq | EXTENDED_RELEASE_TABLET | Freq: Once | ORAL | Status: AC
  Administered 2011-12-11: 40 meq via ORAL
  Filled 2011-12-11: qty 2

## 2011-12-11 MED ORDER — REGADENOSON 0.4 MG/5ML IV SOLN
INTRAVENOUS | Status: AC | PRN
  Administered 2011-12-11: 0.4 mg via INTRAVENOUS

## 2011-12-11 MED ORDER — ACETAMINOPHEN 325 MG PO TABS
650.0000 mg | ORAL_TABLET | Freq: Four times a day (QID) | ORAL | Status: DC | PRN
  Administered 2011-12-11: 650 mg via ORAL

## 2011-12-11 MED ORDER — POTASSIUM CHLORIDE 20 MEQ PO PACK: 40.0000 meq | PACK | Freq: Once | ORAL | Status: DC

## 2011-12-11 NOTE — Progress Notes (Signed)
Patient ID: Destiny Ballard, female   DOB: [birth year], 61 y.o.   MRN: [id number]    SUBJECTIVE: Chest pain has resolved.  She had a sensation of her heart racing yesterday when the chest pain developed.  Pain was worse than her chronic pattern.  She did not have an arrhythmia when she arrived to the ER.  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      [name] amLODipine  10 mg Oral Daily  . aspirin  81 mg Oral Daily  . atorvastatin  40 mg Oral q1800  . enoxaparin  40 mg Subcutaneous Q24H  . ferrous sulfate  325 mg Oral Q breakfast  . FLUoxetine  40 mg Oral Daily  . fluticasone  2 spray Each Nare Daily  . irbesartan  300 mg Oral Daily   And  . hydrochlorothiazide  25 mg Oral Daily  . insulin aspart  0-15 Units Subcutaneous TID WC  . insulin aspart  0-5 Units Subcutaneous QHS  . ipratropium  2 spray Nasal QID  . loratadine  10 mg Oral Daily  . metoprolol succinate  100 mg Oral Daily  . potassium chloride  40 mEq Oral Once  . sodium chloride  3 mL Intravenous Q12H  . vitamin B-12  1,000 mcg Oral Daily  . DISCONTD: amLODipine  5 mg Oral Daily  . DISCONTD: potassium chloride  40 mEq Oral Once  . DISCONTD: valsartan-hydrochlorothiazide  1 tablet Oral Daily      Filed Vitals:   12/10/11 2200 12/10/11 2240 12/10/11 2308 12/10/11 2340  BP: 168/71  172/75 176/80  Pulse: 97  92   Temp:  98.4 F (36.9 C)    TempSrc:  Oral Oral   Resp: 21  19   Height:   4\' 11"  (1.499 m)   Weight:   156 lb 6.4 oz (70.943 kg)   SpO2: 98%  94%    No intake or output data in the 24 hours ending 12/11/11 0759  LABS: Basic Metabolic Panel:  Basename 12/11/11 0214 12/10/11 1935  NA 140 139  K 3.1* 3.5  CL 101 101  CO2 29 26  GLUCOSE 131* 125*  BUN 20 25*  CREATININE 0.64 0.60  CALCIUM 9.3 9.6  MG -- --  PHOS -- --   Liver Function Tests:  [facility] 12/10/11 1935  AST 20  ALT 20  ALKPHOS 74  BILITOT 0.3  PROT 7.4  ALBUMIN 4.0   No results found for this  basename: LIPASE:2,AMYLASE:2 in the last 72 hours CBC:  Basename 12/10/11 1935  WBC 12.0*  NEUTROABS 8.9*  HGB 11.8*  HCT 34.7*  MCV 78.9  PLT 223   Cardiac Enzymes:  Basename 12/11/11 0550 12/11/11 0212 12/10/11 1932  CKTOTAL 59 59 --  CKMB 2.0 2.3 --  CKMBINDEX -- -- --  TROPONINI <0.30 <0.30 <0.30    RADIOLOGY: Dg Chest Port 1 View  12/10/2011  *RADIOLOGY REPORT*  Clinical Data: 61 year old female with chest pain dizziness and shortness of breath.  PORTABLE CHEST - 1 VIEW  Comparison: 10/13/2011 and earlier.  Findings: Portable semi upright AP view at 2009 hours.  Stable lung volumes.  Cardiac size and mediastinal contours are within normal limits.  No pneumothorax, pulmonary edema, pleural effusion or acute pulmonary opacity.  Partially calcified probable right breast implant re-identified.  Partially visible cervical ACDF hardware.  IMPRESSION: No acute cardiopulmonary abnormality.  Original Report Authenticated By: Harley Hallmark, M.D.    PHYSICAL EXAM General:  NAD Neck: No JVD, no thyromegaly or thyroid nodule.  Lungs: Clear to auscultation bilaterally with normal respiratory effort. CV: Nondisplaced PMI.  Heart regular S1/S2, no S3/S4, 2/6 early SEM RUSB.  No peripheral edema.  No carotid bruit.    Abdomen: Soft, nontender, no hepatosplenomegaly, no distention.  Neurologic: Alert and oriented x 3.  Psych: Normal affect. Extremities: No clubbing or cyanosis.   TELEMETRY: Reviewed telemetry pt in NSR, no events  ASSESSMENT AND PLAN:  61 yo with history of HTN and CAD as well as long history of atypical chest pain presented with chest pain and palpitations.  1. CAD: Atypical chest pain, negative cardiac enzymes and non-acute ECG.  No chest pain now.  It was associated with a sensation of her heart racing.  Continue current cardiac meds, will get a Lexiscan myoview this morning.   2. 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  It was  hot and she thinks she may have been dehydrated.  It is certainly possible that this was sinus tachy as she had sinus tachy on arrival to the ER. No events on telemetry.  Will arrange 2 week event monitor at discharge.  3. HTN: BP still high.  Increase amlodipine to 10 mg daily.  4. Disposition: If myoview is normal, she can go home with 2 week event monitor and higher amlodipine dosing.   Marca Ancona 12/11/2011 8:04 AM

## 2011-12-11 NOTE — Discharge Summary (Signed)
Discharge Summary   Patient ID: Destiny Ballard MRN: [id number], DOB/AGE: 11/21/1950 61 y.o. Admit date: 12/10/2011 D/C date:     12/11/2011   Primary Discharge Diagnoses:  1. Chest pain, atypical - Myoview 12/11/11 showing small primarily fixed apical septal perfusion defect (prior MI vs attenuation) but no ischemia; normal EF 2. Palpitations - for 2 week event monitor as OP  Secondary Discharge Diagnoses:  1. CAD s/p DES to LAD 07/2005 2. HTN 3. Hyperlipidemia 4. Insulin resistance 5. Obesity 6. Chronic headache 7. Iron deficiency anemia 8. GERD 9. Anxiety/depression 10. Cervical spondylosis  Hospital Course: 61 y/o F with hx of CAD s/p LAD PIC with most recent Myoview in 02/2011 showing no ischemia or infarction. She was recently seen in clinic with uncontrolled BP. 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 Pain would improve with rest and return as she kept on working. She was brought to ED by EMS from work. She was chest pain free after NTG and ASA. EKG showed normal sinus rhythm, no acute ischemic changes. Initial labwork was normal. There was no objective evidence of ischemia. Dr Shirlee Latch felt that her chest pain was atypical, but recommended Lexiscan myoview. This was performed demonstrating no evidence of significant ischemia, and did show a small primarily fixed apical septal perfusion defect which was felt to represent prior MI vs attenuation. EF was normal. This was felt low risk. The only change made in meds was an increase in amlodipine for better BP control. Cardiac enzymes remained negative.  The patient did note to Dr. Shirlee Latch that she had a sensation of her heart racing when the pain developed. She thought she might be dehydrated during her route yesterday. Dr. Shirlee Latch felt It was certainly possible that this was sinus tachy as she had sinus tachy on arrival to the ER, but recommended 2 week event  monitor at discharge. There were no events on telemetry while an inpatient. The patient was seen and examined today and felt stable for discharge by Dr. Shirlee Latch.  Discharge Vitals: Blood pressure 143/74, pulse 80, temperature 98 F (36.7 C), temperature source Oral, resp. rate 18, height 4\' 11"  (1.499 m), weight 156 lb 6.4 oz (70.943 kg), SpO2 95.00%.  Labs: Lab Results  Component Value Date   WBC 12.0* 12/10/2011   HGB 11.8* 12/10/2011   HCT 34.7* 12/10/2011   MCV 78.9 12/10/2011   PLT 223 12/10/2011     Lab 12/11/11 0214 12/10/11 1935  NA 140 --  K 3.1* --  CL 101 --  CO2 29 --  BUN 20 --  CREATININE 0.64 --  CALCIUM 9.3 --  PROT -- 7.4  BILITOT -- 0.3  ALKPHOS -- 74  ALT -- 20  AST -- 20  GLUCOSE 131* --    Basename 12/11/11 1253 12/11/11 0550 12/11/11 0212 12/10/11 1932  CKTOTAL 58 59 59 --  CKMB 1.8 2.0 2.3 --  TROPONINI <0.30 <0.30 <0.30 <0.30   Lab Results  Component Value Date   CHOL 148 11/16/2011   HDL 44 11/16/2011   LDLCALC 77 11/16/2011   TRIG 137 11/16/2011    Diagnostic Studies/Procedures   1. Nm Myocar Multi W/spect W/wall Motion / Ef 12/11/2011  *RADIOLOGY REPORT*  Clinical Data:  Chest pain  MYOCARDIAL IMAGING WITH SPECT (REST AND PHARMACOLOGIC-STRESS) GATED LEFT VENTRICULAR WALL MOTION STUDY LEFT VENTRICULAR EJECTION FRACTION  Technique:  Standard myocardial SPECT imaging was performed after resting intravenous injection of  10 mCi Tc-63m tetrofosmin. Subsequently, intravenous infusion of regadenoson was performed under the supervision of the Cardiology staff.  At peak effect of the drug, 30 mCi Tc-74m tetrofosmin was injected intravenously and standard myocardial SPECT  imaging was performed.  Quantitative gated imaging was also performed to evaluate left ventricular wall motion, and estimate left ventricular ejection fraction.  Comparison:  None.  Findings: Lexiscan ECG showed no significant changes from baseline. Gated images showed normal wall motion with EF >  60%.  There was a small, moderate apical septal perfusion defect seen both at rest and with stress.  IMPRESSION: 1. Normal LV systolic function .  2. Small primarily fixed apical septal perfusion defect.  This could represent prior MI versus attenuation.  No significant ischemia.  Low risk study overall.  Original Report Authenticated By: Gretta Began   2. Chest Port 1 View 12/10/2011  *RADIOLOGY REPORT*  Clinical Data: 61 year old female with chest pain dizziness and shortness of breath.  PORTABLE CHEST - 1 VIEW  Comparison: 10/13/2011 and earlier.  Findings: Portable semi upright AP view at 2009 hours.  Stable lung volumes.  Cardiac size and mediastinal contours are within normal limits.  No pneumothorax, pulmonary edema, pleural effusion or acute pulmonary opacity.  Partially calcified probable right breast implant re-identified.  Partially visible cervical ACDF hardware.  IMPRESSION: No acute cardiopulmonary abnormality.  Original Report Authenticated By: Harley Hallmark, M.D.     Discharge Medications   Medication List  As of 12/11/2011  3:56 PM   TAKE these medications         albuterol 108 (90 BASE) MCG/ACT inhaler   Commonly known as: PROVENTIL HFA;VENTOLIN HFA   Inhale 1 puff into the lungs every 6 (six) hours as needed for wheezing.      amLODipine 10 MG tablet   Commonly known as: NORVASC   Take 1 tablet (10 mg total) by mouth daily.      aspirin 81 MG tablet   Take 81 mg by mouth daily.      ferrous sulfate 325 (65 FE) MG tablet   Take 325 mg by mouth daily with breakfast.      fexofenadine 180 MG tablet   Commonly known as: ALLEGRA   Take 180 mg by mouth daily.      FLUoxetine 40 MG capsule   Commonly known as: PROZAC   Take 1 capsule (40 mg total) by mouth daily.      fluticasone 27.5 MCG/SPRAY nasal spray   Commonly known as: VERAMYST   Place 2 sprays into the nose daily.      GAS-X PO   Take 1 tablet by mouth daily as needed. For gas        ipratropium 0.06 % nasal  spray   Commonly known as: ATROVENT   Place 2 sprays into the nose 4 (four) times daily.      IRON PO   Take by mouth daily.      metoprolol succinate 100 MG 24 hr tablet   Commonly known as: TOPROL-XL   Take 1 tablet (100 mg total) by mouth daily.      nitroGLYCERIN 0.4 MG SL tablet   Commonly known as: NITROSTAT   Place 1 tablet (0.4 mg total) under the tongue every 5 (five) minutes as needed for chest pain (up to 3 doses).      rosuvastatin 20 MG tablet   Commonly known as: CRESTOR   Take 20 mg by mouth daily.      valsartan-hydrochlorothiazide  320-25 MG per tablet   Commonly known as: DIOVAN-HCT   Take 1 tablet by mouth daily.      vitamin B-12 1000 MCG tablet   Commonly known as: CYANOCOBALAMIN   Take 1,000 mcg by mouth daily.      Vitamin D3 1000 UNITS Caps   Take by mouth daily.          Given her hx of CAD we will also provide an RX for NTG PRN just to have on hand, even though her CP this time was not necessarily deemed cardiac.  Disposition   The patient will be discharged in stable condition to home. Discharge Orders    Future Appointments: Provider: Department: Dept Phone: Center:   01/07/2012 10:30 AM Ok Anis, NP [facility] [phone] [facility]     Future Orders Please Complete By Expires   Diet - low sodium heart healthy      Increase activity slowly      Discharge instructions      Comments:   You may return to work as long as you stay well hydrated.     Follow-up Information    Follow up with Primary Care Doctor. (To follow your other general medical issues, including blood sugars and anemia)       Follow up with [facility]. (Our office will call you with a time to pick up your heart monitor.  You will have a follow-up appointment with Ward Givens, NP at Dr. Alford Highland office on 01/07/12 at 10:30am)    Contact information:   [address] [place] [address] [phone]       Ms.  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 This was written for her.    Duration of Discharge Encounter: Greater than 30 minutes including physician and PA time.  Signed, Ronie Spies PA-C 12/11/2011, 3:56 PM

## 2011-12-11 NOTE — ED Notes (Signed)
Stress test started at 1134, completed at 1141.  Pt tolerated well without adverse effects or symptoms

## 2011-12-11 NOTE — ED Provider Notes (Signed)
History     CSN: [id number]  Arrival date & time 12/10/11  1859   First MD Initiated Contact with Patient 12/10/11 1908      Chief Complaint  Patient presents with  . Chest Pain    (Consider location/radiation/quality/duration/timing/severity/associated sxs/prior treatment) Patient is a 61 y.o. female presenting with chest pain. The history is provided by the patient.  Chest Pain The chest pain began 3 - 5 hours ago. Chest pain occurs constantly. The chest pain is resolved. 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 The severity of the pain is moderate. The quality of the pain is described as tightness. The pain does not radiate. Chest pain is worsened by exertion. Primary symptoms include palpitations. Pertinent negatives for primary symptoms include no fever, no fatigue, no syncope and no cough.  Her past medical history is significant for CAD.  Procedure history is positive for cardiac catheterization. Procedure history comments: with stent in 2004.[name]     Past Medical History  Diagnosis Date  . IHD (ischemic heart disease)     Prior stent to LAD in 2007  . Hypertension   . Hyperlipidemia   . Obesity   . Anxiety     Prior suicide attempt  . Depression   . S/P cardiac catheterization 2008    Stent patent  . Normal nuclear stress test 2010  . Cervical spondylosis     Past Surgical History  Procedure Date  . Cardiac catheterization 06/17/2007    NORMAL. EF 60%  . Cardiac catheterization 07/24/2005    NORMAL. EF 60-70%  . Coronary stent placement 07/2005    LEFT ANTERIOR DESCENDING  . Tubal ligation   . Tonsillectomy and adenoidectomy   . Childbirth     X3  . Breast enhancement surgery   . Cervical spondylosis     SINGLE LEVEL FUSION    Family History  Problem Relation Age of Onset  . Heart attack Mother   . Diabetes Mother   . Cancer Mother   . Suicidality Father     History  Substance Use Topics  . Smoking status: Never Smoker   .  Smokeless tobacco: Never Used  . Alcohol Use: Yes     social    OB History    Grav Para Term Preterm Abortions TAB SAB Ect Mult Living                  Review of Systems  Constitutional: Negative for fever and fatigue.  Respiratory: Negative for cough.   Cardiovascular: Positive for chest pain and palpitations. Negative for syncope.  All other systems reviewed and are negative.    Allergies  Prednisone  Home Medications  No current outpatient prescriptions on file.  BP 176/80  Pulse 92  Temp(Src) 98.4 F (36.9 C) (Oral)  Resp 19  Ht 4\' 11"  (1.499 m)  Wt 156 lb 6.4 oz (70.943 kg)  BMI 31.59 kg/m2  SpO2 94%  Physical Exam  Nursing note and vitals reviewed. Constitutional: She is oriented to person, place, and time. She appears well-developed and well-nourished. No distress.  HENT:  Head: Normocephalic and atraumatic.  Neck: Normal range of motion. Neck supple.  Cardiovascular: Normal rate and regular rhythm.  Exam reveals no gallop and no friction rub.   No murmur heard. Pulmonary/Chest: Effort normal and breath sounds normal. No respiratory distress. She has no wheezes.  Abdominal: Soft. Bowel sounds are normal. She exhibits no distension. There is no tenderness.  Musculoskeletal: Normal range  of motion.  Neurological: She is alert and oriented to person, place, and time.  Skin: Skin is warm and dry. She is not diaphoretic.    ED Course  Procedures (including critical care time)  Labs Reviewed  CBC - Abnormal; Notable for the following:    WBC 12.0 (*)    Hemoglobin 11.8 (*)    HCT 34.7 (*)    All other components within normal limits  DIFFERENTIAL - Abnormal; Notable for the following:    Neutro Abs 8.9 (*)    All other components within normal limits  COMPREHENSIVE METABOLIC PANEL - Abnormal; Notable for the following:    Glucose, Bld 125 (*)    BUN 25 (*)    All other components within normal limits  BASIC METABOLIC PANEL - Abnormal; Notable for the  following:    Potassium 3.1 (*)    Glucose, Bld 131 (*)    All other components within normal limits  GLUCOSE, CAPILLARY - Abnormal; Notable for the following:    Glucose-Capillary 103 (*)    All other components within normal limits  GLUCOSE, CAPILLARY - Abnormal; Notable for the following:    Glucose-Capillary 112 (*)    All other components within normal limits  TROPONIN I  PROTIME-INR  CARDIAC PANEL(CRET KIN+CKTOT+MB+TROPI)  CARDIAC PANEL(CRET KIN+CKTOT+MB+TROPI)  CARDIAC PANEL(CRET KIN+CKTOT+MB+TROPI)   Dg Chest Port 1 View  12/10/2011  *RADIOLOGY REPORT*  Clinical Data: 61 year old female with chest pain dizziness and shortness of breath.  PORTABLE CHEST - 1 VIEW  Comparison: 10/13/2011 and earlier.  Findings: Portable semi upright AP view at 2009 hours.  Stable lung volumes.  Cardiac size and mediastinal contours are within normal limits.  No pneumothorax, pulmonary edema, pleural effusion or acute pulmonary opacity.  Partially calcified probable right breast implant re-identified.  Partially visible cervical ACDF hardware.  IMPRESSION: No acute cardiopulmonary abnormality.  Original Report Authenticated By: Ulla Potash III, M.D.     1. Coronary atherosclerosis of native coronary artery   2. Unspecified essential hypertension   3. Hypertension   4. Cough   5. 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  She has a history of cad with stents many years ago.  The workup returned unremarkable, but given her symptoms and history I feel as though she should be admitted for observation, further workup.  I have consulted Dr. Terressa Koyanagi from cardiology who will see the patient in the ED.        Geoffery Lyons, MD 12/11/11 1004

## 2011-12-11 NOTE — ED Notes (Addendum)
[facility] PA T. Arguello at bedside for start and throughout stress test

## 2011-12-17 ENCOUNTER — Encounter (INDEPENDENT_AMBULATORY_CARE_PROVIDER_SITE_OTHER): Payer: No Typology Code available for payment source

## 2011-12-17 DIAGNOSIS — R002 Palpitations: Secondary | ICD-10-CM

## 2011-12-20 ENCOUNTER — Other Ambulatory Visit: Payer: Self-pay | Admitting: Cardiology

## 2011-12-20 MED ORDER — VALSARTAN-HYDROCHLOROTHIAZIDE 320-25 MG PO TABS: 1.0000 | ORAL_TABLET | Freq: Every day | ORAL | Status: DC

## 2011-12-23 ENCOUNTER — Other Ambulatory Visit: Payer: Self-pay | Admitting: *Deleted

## 2011-12-23 DIAGNOSIS — I2510 Atherosclerotic heart disease of native coronary artery without angina pectoris: Secondary | ICD-10-CM

## 2011-12-23 DIAGNOSIS — I10 Essential (primary) hypertension: Secondary | ICD-10-CM

## 2011-12-23 MED ORDER — METOPROLOL SUCCINATE ER 100 MG PO TB24: 100.0000 mg | ORAL_TABLET | Freq: Every day | ORAL | Status: DC

## 2012-01-04 ENCOUNTER — Ambulatory Visit (INDEPENDENT_AMBULATORY_CARE_PROVIDER_SITE_OTHER): Payer: No Typology Code available for payment source | Admitting: Family Medicine

## 2012-01-04 VITALS — BP 104/58 | HR 92 | Temp 99.7°F | Resp 16 | Ht 59.52 in | Wt 157.6 lb

## 2012-01-04 DIAGNOSIS — IMO0002 Reserved for concepts with insufficient information to code with codable children: Secondary | ICD-10-CM

## 2012-01-04 DIAGNOSIS — R52 Pain, unspecified: Secondary | ICD-10-CM

## 2012-01-04 DIAGNOSIS — L02419 Cutaneous abscess of limb, unspecified: Secondary | ICD-10-CM

## 2012-01-04 LAB — GLUCOSE, POCT (MANUAL RESULT ENTRY): POC Glucose: 98 mg/dl (ref 70–99)

## 2012-01-04 LAB — POCT CBC
Granulocyte percent: 78.5 %G (ref 37–80)
HCT, POC: 37.7 % (ref 37.7–47.9)
Hemoglobin: 11.6 g/dL — AB (ref 12.2–16.2)
Lymph, poc: 2.7 (ref 0.6–3.4)
MCH, POC: 24.7 pg — AB (ref 27–31.2)
MCHC: 30.8 g/dL — AB (ref 31.8–35.4)
MCV: 80.4 fL (ref 80–97)
MID (cbc): 1.1 — AB (ref 0–0.9)
MPV: 9.1 fL (ref 0–99.8)
POC Granulocyte: 13.8 — AB (ref 2–6.9)
POC LYMPH PERCENT: 15.5 %L (ref 10–50)
POC MID %: 6 %M (ref 0–12)
Platelet Count, POC: 297 10*3/uL (ref 142–424)
RBC: 4.69 M/uL (ref 4.04–5.48)
RDW, POC: 14.4 %
WBC: 17.6 10*3/uL — AB (ref 4.6–10.2)

## 2012-01-04 MED ORDER — SULFAMETHOXAZOLE-TRIMETHOPRIM 800-160 MG PO TABS: 1.0000 | ORAL_TABLET | Freq: Two times a day (BID) | ORAL | Status: DC

## 2012-01-04 MED ORDER — HYDROCODONE-ACETAMINOPHEN 5-500 MG PO TABS: 1.0000 | ORAL_TABLET | ORAL | Status: DC | PRN

## 2012-01-04 MED ORDER — CEFTRIAXONE SODIUM 1 G IJ SOLR
1.0000 g | Freq: Once | INTRAMUSCULAR | Status: AC
  Administered 2012-01-04: 1 g via INTRAMUSCULAR

## 2012-01-04 NOTE — Progress Notes (Signed)
Subjective: Patient has a painful boil area in her left axilla that has been bothering her since early this week. Gotten steadily worse.  Objective: Approximate centimeter diameter soft tissue fluctuant area. There are 2 places superiorly near the axilla but looks like its coming to a head. The bulk of the cavity seems to extend below that, in the posterior axillary line, not into the breast.  Assessment: Large left axillary abscess  Plan: We'll try to see if we can drain is here. Will give 1gm ceftriaxone  Results for orders placed in visit on 01/04/12  POCT CBC      Component Value Range   WBC 17.6 (*) 4.6 - 10.2 K/uL   Lymph, poc 2.7  0.6 - 3.4   POC LYMPH PERCENT 15.5  10 - 50 %L   MID (cbc) 1.1 (*) 0 - 0.9   POC MID % 6.0  0 - 12 %M   POC Granulocyte 13.8 (*) 2 - 6.9   Granulocyte percent 78.5  37 - 80 %G   RBC 4.69  4.04 - 5.48 M/uL   Hemoglobin 11.6 (*) 12.2 - 16.2 g/dL   HCT, POC 16.1  09.6 - 47.9 %   MCV 80.4  80 - 97 fL   MCH, POC 24.7 (*) 27 - 31.2 pg   MCHC 30.8 (*) 31.8 - 35.4 g/dL   RDW, POC 04.5     Platelet Count, POC 297  142 - 424 K/uL   MPV 9.1  0 - 99.8 fL  GLUCOSE, POCT (MANUAL RESULT ENTRY)      Component Value Range   POC Glucose 98  70 - 99 mg/dl

## 2012-01-04 NOTE — Progress Notes (Signed)
Procedure  VCO  Local injection of Lidocaine 2% plain with Marcaine 1:1 10 cc total in 2 sites Betadine prep Incision made with # 11 blade.  Copious purulent drainage expressed.  Packed with 1/4" plain. Cleansed and dressed

## 2012-01-04 NOTE — Patient Instructions (Addendum)
Return Sunday 01/05/12 for recheck between 8 and 4 pm Apply heat to the area. Take antibiotics. Watch for fever and chills

## 2012-01-05 ENCOUNTER — Ambulatory Visit (INDEPENDENT_AMBULATORY_CARE_PROVIDER_SITE_OTHER): Payer: No Typology Code available for payment source | Admitting: Physician Assistant

## 2012-01-05 ENCOUNTER — Inpatient Hospital Stay (HOSPITAL_COMMUNITY)
Admission: AD | Admit: 2012-01-05 | Discharge: 2012-01-09 | DRG: 803 | Disposition: A | Discharge status: Home / Self Care | Payer: No Typology Code available for payment source | Source: Ambulatory Visit | Attending: Family Medicine | Admitting: Family Medicine

## 2012-01-05 ENCOUNTER — Encounter (HOSPITAL_COMMUNITY): Payer: Self-pay | Admitting: Family Medicine

## 2012-01-05 VITALS — BP 93/55 | HR 84 | Temp 98.7°F | Resp 18 | Ht 59.0 in | Wt 157.8 lb

## 2012-01-05 DIAGNOSIS — K219 Gastro-esophageal reflux disease without esophagitis: Secondary | ICD-10-CM | POA: Diagnosis present

## 2012-01-05 DIAGNOSIS — E669 Obesity, unspecified: Secondary | ICD-10-CM | POA: Diagnosis present

## 2012-01-05 DIAGNOSIS — L02419 Cutaneous abscess of limb, unspecified: Secondary | ICD-10-CM | POA: Diagnosis present

## 2012-01-05 DIAGNOSIS — E871 Hypo-osmolality and hyponatremia: Secondary | ICD-10-CM | POA: Diagnosis present

## 2012-01-05 DIAGNOSIS — F3289 Other specified depressive episodes: Secondary | ICD-10-CM | POA: Diagnosis present

## 2012-01-05 DIAGNOSIS — L03112 Cellulitis of left axilla: Secondary | ICD-10-CM | POA: Diagnosis present

## 2012-01-05 DIAGNOSIS — I959 Hypotension, unspecified: Secondary | ICD-10-CM

## 2012-01-05 DIAGNOSIS — I2510 Atherosclerotic heart disease of native coronary artery without angina pectoris: Secondary | ICD-10-CM | POA: Diagnosis present

## 2012-01-05 DIAGNOSIS — IMO0002 Reserved for concepts with insufficient information to code with codable children: Secondary | ICD-10-CM

## 2012-01-05 DIAGNOSIS — E785 Hyperlipidemia, unspecified: Secondary | ICD-10-CM | POA: Diagnosis present

## 2012-01-05 DIAGNOSIS — F411 Generalized anxiety disorder: Secondary | ICD-10-CM | POA: Diagnosis present

## 2012-01-05 DIAGNOSIS — F329 Major depressive disorder, single episode, unspecified: Secondary | ICD-10-CM | POA: Diagnosis present

## 2012-01-05 DIAGNOSIS — I10 Essential (primary) hypertension: Secondary | ICD-10-CM | POA: Diagnosis present

## 2012-01-05 DIAGNOSIS — A4902 Methicillin resistant Staphylococcus aureus infection, unspecified site: Secondary | ICD-10-CM | POA: Diagnosis present

## 2012-01-05 DIAGNOSIS — F32A Depression, unspecified: Secondary | ICD-10-CM

## 2012-01-05 DIAGNOSIS — D649 Anemia, unspecified: Secondary | ICD-10-CM | POA: Diagnosis present

## 2012-01-05 DIAGNOSIS — Z9861 Coronary angioplasty status: Secondary | ICD-10-CM

## 2012-01-05 DIAGNOSIS — E876 Hypokalemia: Secondary | ICD-10-CM | POA: Diagnosis present

## 2012-01-05 DIAGNOSIS — L02412 Cutaneous abscess of left axilla: Secondary | ICD-10-CM

## 2012-01-05 DIAGNOSIS — L049 Acute lymphadenitis, unspecified: Principal | ICD-10-CM | POA: Diagnosis present

## 2012-01-05 DIAGNOSIS — M47812 Spondylosis without myelopathy or radiculopathy, cervical region: Secondary | ICD-10-CM | POA: Diagnosis present

## 2012-01-05 HISTORY — PX: INCISION AND DRAINAGE BREAST ABSCESS: SUR672

## 2012-01-05 LAB — POCT CBC
Granulocyte percent: 68.8 %G (ref 37–80)
HCT, POC: 37.3 % — AB (ref 37.7–47.9)
Hemoglobin: 11.5 g/dL — AB (ref 12.2–16.2)
Lymph, poc: 1.7 (ref 0.6–3.4)
MCH, POC: 25.8 pg — AB (ref 27–31.2)
MCHC: 30.8 g/dL — AB (ref 31.8–35.4)
MCV: 83.9 fL (ref 80–97)
MID (cbc): 0.9 (ref 0–0.9)
MPV: 8.5 fL (ref 0–99.8)
POC Granulocyte: 5.8 (ref 2–6.9)
POC LYMPH PERCENT: 20.8 %L (ref 10–50)
POC MID %: 10.4 %M (ref 0–12)
Platelet Count, POC: 216 10*3/uL (ref 142–424)
RBC: 4.45 M/uL (ref 4.04–5.48)
RDW, POC: 18.5 %
WBC: 8.4 10*3/uL (ref 4.6–10.2)

## 2012-01-05 LAB — BASIC METABOLIC PANEL
BUN: 30 mg/dL — ABNORMAL HIGH (ref 6–23)
CO2: 29 mEq/L (ref 19–32)
Calcium: 8.8 mg/dL (ref 8.4–10.5)
Chloride: 91 mEq/L — ABNORMAL LOW (ref 96–112)
Creatinine, Ser: 1.11 mg/dL — ABNORMAL HIGH (ref 0.50–1.10)
GFR calc Af Amer: 60 mL/min — ABNORMAL LOW (ref >90)
GFR calc non Af Amer: 52 mL/min — ABNORMAL LOW (ref >90)
Glucose, Bld: 152 mg/dL — ABNORMAL HIGH (ref 70–99)
Potassium: 3.1 mEq/L — ABNORMAL LOW (ref 3.5–5.1)
Sodium: 130 mEq/L — ABNORMAL LOW (ref 135–145)

## 2012-01-05 LAB — CBC
HCT: 30.7 % — ABNORMAL LOW (ref 36.0–46.0)
Hemoglobin: 10.1 g/dL — ABNORMAL LOW (ref 12.0–15.0)
MCH: 26.2 pg (ref 26.0–34.0)
MCHC: 32.9 g/dL (ref 30.0–36.0)
MCV: 79.5 fL (ref 78.0–100.0)
Platelets: 208 10*3/uL (ref 150–400)
RBC: 3.86 MIL/uL — ABNORMAL LOW (ref 3.87–5.11)
RDW: 13.7 % (ref 11.5–15.5)
WBC: 20 10*3/uL — ABNORMAL HIGH (ref 4.0–10.5)

## 2012-01-05 MED ORDER — FLUOXETINE HCL 40 MG PO CAPS
40.0000 mg | ORAL_CAPSULE | Freq: Every day | ORAL | Status: DC
  Administered 2012-01-06 – 2012-01-09 (×4): 40 mg via ORAL
  Filled 2012-01-05 (×4): qty 1

## 2012-01-05 MED ORDER — SODIUM CHLORIDE 0.9 % IV BOLUS (SEPSIS)
500.0000 mL | Freq: Once | INTRAVENOUS | Status: AC
  Administered 2012-01-05: 1000 mL via INTRAVENOUS

## 2012-01-05 MED ORDER — ASPIRIN 81 MG PO TABS: 81.0000 mg | ORAL_TABLET | Freq: Every day | ORAL | Status: DC

## 2012-01-05 MED ORDER — AMLODIPINE BESYLATE 10 MG PO TABS: 10.0000 mg | ORAL_TABLET | Freq: Every day | ORAL | Status: DC

## 2012-01-05 MED ORDER — MORPHINE SULFATE 4 MG/ML IJ SOLN
4.0000 mg | INTRAMUSCULAR | Status: DC | PRN
  Administered 2012-01-05: 4 mg via INTRAVENOUS
  Filled 2012-01-05: qty 1

## 2012-01-05 MED ORDER — LORATADINE 10 MG PO TABS
10.0000 mg | ORAL_TABLET | Freq: Every day | ORAL | Status: DC
  Administered 2012-01-06 – 2012-01-09 (×4): 10 mg via ORAL
  Filled 2012-01-05 (×4): qty 1

## 2012-01-05 MED ORDER — FLUTICASONE FUROATE 27.5 MCG/SPRAY NA SUSP
2.0000 | Freq: Every day | NASAL | Status: DC
  Filled 2012-01-05 (×9): qty 2

## 2012-01-05 MED ORDER — ALBUTEROL SULFATE HFA 108 (90 BASE) MCG/ACT IN AERS
1.0000 | INHALATION_SPRAY | Freq: Four times a day (QID) | RESPIRATORY_TRACT | Status: DC | PRN
  Filled 2012-01-05: qty 6.7

## 2012-01-05 MED ORDER — SODIUM CHLORIDE 0.9 % IV BOLUS (SEPSIS)
1000.0000 mL | Freq: Once | INTRAVENOUS | Status: AC
  Administered 2012-01-05: 1000 mL via INTRAVENOUS

## 2012-01-05 MED ORDER — IPRATROPIUM BROMIDE 0.06 % NA SOLN
2.0000 | Freq: Four times a day (QID) | NASAL | Status: DC
  Administered 2012-01-07 – 2012-01-09 (×3): 2 via NASAL
  Filled 2012-01-05: qty 15

## 2012-01-05 MED ORDER — HYDROCHLOROTHIAZIDE 25 MG PO TABS
25.0000 mg | ORAL_TABLET | Freq: Every day | ORAL | Status: DC
  Administered 2012-01-07 – 2012-01-09 (×3): 25 mg via ORAL
  Filled 2012-01-05 (×4): qty 1

## 2012-01-05 MED ORDER — POTASSIUM CHLORIDE CRYS ER 10 MEQ PO TBCR
40.0000 meq | EXTENDED_RELEASE_TABLET | Freq: Two times a day (BID) | ORAL | Status: AC
  Administered 2012-01-05 – 2012-01-06 (×2): 40 meq via ORAL
  Filled 2012-01-05 (×2): qty 4

## 2012-01-05 MED ORDER — ENOXAPARIN SODIUM 40 MG/0.4ML ~~LOC~~ SOLN
40.0000 mg | SUBCUTANEOUS | Status: DC
  Administered 2012-01-07 – 2012-01-08 (×3): 40 mg via SUBCUTANEOUS
  Filled 2012-01-05 (×5): qty 0.4

## 2012-01-05 MED ORDER — OXYCODONE-ACETAMINOPHEN 5-325 MG PO TABS
1.0000 | ORAL_TABLET | ORAL | Status: DC | PRN
  Administered 2012-01-05 – 2012-01-08 (×9): 1 via ORAL
  Filled 2012-01-05 (×9): qty 1

## 2012-01-05 MED ORDER — FLUTICASONE PROPIONATE 50 MCG/ACT NA SUSP
2.0000 | Freq: Every day | NASAL | Status: DC
  Administered 2012-01-08: 2 via NASAL
  Filled 2012-01-05: qty 16

## 2012-01-05 MED ORDER — CLINDAMYCIN PHOSPHATE 300 MG/50ML IV SOLN
300.0000 mg | Freq: Four times a day (QID) | INTRAVENOUS | Status: DC
  Administered 2012-01-05 – 2012-01-07 (×7): 300 mg via INTRAVENOUS
  Filled 2012-01-05 (×9): qty 50

## 2012-01-05 MED ORDER — ASPIRIN EC 81 MG PO TBEC
81.0000 mg | DELAYED_RELEASE_TABLET | Freq: Every day | ORAL | Status: DC
  Administered 2012-01-06 – 2012-01-09 (×4): 81 mg via ORAL
  Filled 2012-01-05 (×4): qty 1

## 2012-01-05 MED ORDER — ATORVASTATIN CALCIUM 40 MG PO TABS
40.0000 mg | ORAL_TABLET | Freq: Every day | ORAL | Status: DC
  Administered 2012-01-06 – 2012-01-08 (×3): 40 mg via ORAL
  Filled 2012-01-05 (×4): qty 1

## 2012-01-05 MED ORDER — VITAMIN B-12 1000 MCG PO TABS
1000.0000 ug | ORAL_TABLET | Freq: Every day | ORAL | Status: DC
  Administered 2012-01-06 – 2012-01-09 (×4): 1000 ug via ORAL
  Filled 2012-01-05 (×4): qty 1

## 2012-01-05 MED ORDER — ONDANSETRON HCL 4 MG/2ML IJ SOLN: 4.0000 mg | Freq: Four times a day (QID) | INTRAMUSCULAR | Status: DC | PRN

## 2012-01-05 MED ORDER — VALSARTAN-HYDROCHLOROTHIAZIDE 320-25 MG PO TABS: 1.0000 | ORAL_TABLET | Freq: Every day | ORAL | Status: DC

## 2012-01-05 MED ORDER — LORAZEPAM 2 MG/ML IJ SOLN
2.0000 mg | Freq: Once | INTRAMUSCULAR | Status: AC
  Administered 2012-01-05: 2 mg via INTRAVENOUS
  Filled 2012-01-05: qty 1

## 2012-01-05 MED ORDER — AMLODIPINE BESYLATE 10 MG PO TABS
10.0000 mg | ORAL_TABLET | Freq: Every day | ORAL | Status: DC
  Administered 2012-01-07 – 2012-01-09 (×3): 10 mg via ORAL
  Filled 2012-01-05 (×4): qty 1

## 2012-01-05 MED ORDER — METOPROLOL SUCCINATE ER 100 MG PO TB24
100.0000 mg | ORAL_TABLET | Freq: Every day | ORAL | Status: DC
  Administered 2012-01-07 – 2012-01-09 (×3): 100 mg via ORAL
  Filled 2012-01-05 (×4): qty 1

## 2012-01-05 MED ORDER — IRBESARTAN 300 MG PO TABS
300.0000 mg | ORAL_TABLET | Freq: Every day | ORAL | Status: DC
  Administered 2012-01-07 – 2012-01-09 (×3): 300 mg via ORAL
  Filled 2012-01-05 (×4): qty 1

## 2012-01-05 MED ORDER — SODIUM CHLORIDE 0.9 % IV SOLN
INTRAVENOUS | Status: DC
  Administered 2012-01-05 – 2012-01-09 (×8): via INTRAVENOUS

## 2012-01-05 MED ORDER — FLUOXETINE HCL 40 MG PO CAPS: 40.0000 mg | ORAL_CAPSULE | Freq: Every day | ORAL | Status: DC

## 2012-01-05 NOTE — H&P (Signed)
Destiny Ballard is an 61 y.o. female.   Chief Complaint: left axillary abscess and cellulitis HPI:  Patient was seen at Dr. Deforest Hoyles office yesterday for a left axillary/breast abscess. This was I+D at the office and packed with 1.5 inches of packing. Patient was given an injection of antibiotic and was sent home with keflex. She returned to clinic today and was found to have pain, expanding cellulitis and drop in blood pressure to 95/55.  Patient was accepted to [facility] as a direct admit to the [facility] service.  Patient is found to have a incompletely drained abscess and surrounding cellulitis of the left axillary area. She is currently afebrile, but feels warm and has a headache.   BP Readings from Last 3 Encounters:  01/05/12 93/55  01/04/12 104/58  12/11/11 143/74    Past Medical History  Diagnosis Date  . CAD (coronary artery disease)     a) s/p DES to LAD 07/2005 b) Last Myoview low risk 11/2011 showing small fixed apical perfusion defect (prior MI vs attenuation) but no ischemia - normal EF.  [name] Hypertension   . Hyperlipidemia   . Obesity   . Anxiety     Prior suicide attempt  . Depression   . Cervical spondylosis   . Insulin resistance   . Iron deficiency anemia   . GERD (gastroesophageal reflux disease)     Past Surgical History  Procedure Date  . Cardiac catheterization 06/17/2007    NORMAL. EF 60%  . Coronary stent placement 07/2005    LEFT ANTERIOR DESCENDING  . Tubal ligation   . Tonsillectomy and adenoidectomy   . Childbirth     X3  . Breast enhancement surgery   . Cervical spondylosis     SINGLE LEVEL FUSION    Family History  Problem Relation Age of Onset  . Heart attack Mother   . Diabetes Mother   . Cancer Mother   . Suicidality Father    Social History:  reports that she has never smoked. She has never used smokeless tobacco. She reports that she drinks alcohol. She reports that she does not use illicit drugs.  Allergies:  Allergies  Allergen Reactions  .  Prednisone     REACTION: mood swings, nightmares. "Shot doesn't bother me, reaction is just with the pill"    Medications Prior to Admission  Medication Sig Dispense Refill  . albuterol (PROVENTIL HFA;VENTOLIN HFA) 108 (90 BASE) MCG/ACT inhaler Inhale 1 puff into the lungs every 6 (six) hours as needed. For shortness of breath, chronic bronchitis      . amLODipine (NORVASC) 10 MG tablet Take 10 mg by mouth daily.      [name] aspirin 81 MG tablet Take 81 mg by mouth daily.        . Cholecalciferol (VITAMIN D3) 1000 UNITS CAPS Take by mouth daily.        . ferrous sulfate 325 (65 FE) MG tablet Take 325 mg by mouth daily with breakfast.      . fexofenadine (ALLEGRA) 180 MG tablet Take 180 mg by mouth daily.        [name] FLUoxetine (PROZAC) 40 MG capsule Take 1 capsule (40 mg total) by mouth daily.  90 capsule  2  . fluticasone (VERAMYST) 27.5 MCG/SPRAY nasal spray Place 2 sprays into the nose daily.      [name] HYDROcodone-acetaminophen (VICODIN) 5-500 MG per tablet Take 1 tablet by mouth every 4 (four) hours as needed.      [name] ipratropium (ATROVENT)  0.06 % nasal spray Place 2 sprays into the nose 4 (four) times daily.  15 mL  2  . metoprolol succinate (TOPROL-XL) 100 MG 24 hr tablet Take 100 mg by mouth daily.      . nitroGLYCERIN (NITROSTAT) 0.4 MG SL tablet Place 0.4 mg under the tongue every 5 (five) minutes as needed.      . rosuvastatin (CRESTOR) 20 MG tablet Take 20 mg by mouth daily.      . Simethicone (GAS-X PO) Take 1 tablet by mouth daily as needed. For gas       . sulfamethoxazole-trimethoprim (BACTRIM DS,SEPTRA DS) 800-160 MG per tablet Take 1 tablet by mouth 2 (two) times daily.      . valsartan-hydrochlorothiazide (DIOVAN-HCT) 320-25 MG per tablet Take 1 tablet by mouth daily.      . vitamin B-12 (CYANOCOBALAMIN) 1000 MCG tablet Take 1,000 mcg by mouth daily.       [name] DISCONTD: albuterol (PROVENTIL HFA;VENTOLIN HFA) 108 (90 BASE) MCG/ACT inhaler Inhale 1 puff into the lungs every 6 (six) hours as  needed for wheezing.  1 Inhaler  0  . DISCONTD: amLODipine (NORVASC) 10 MG tablet Take 1 tablet (10 mg total) by mouth daily.  30 tablet  6  . DISCONTD: HYDROcodone-acetaminophen (VICODIN) 5-500 MG per tablet Take 1 tablet by mouth every 4 (four) hours as needed for pain.  15 tablet  0  . DISCONTD: metoprolol succinate (TOPROL-XL) 100 MG 24 hr tablet Take 1 tablet (100 mg total) by mouth daily.  90 tablet  2  . DISCONTD: nitroGLYCERIN (NITROSTAT) 0.4 MG SL tablet Place 1 tablet (0.4 mg total) under the tongue every 5 (five) minutes as needed for chest pain (up to 3 doses).  25 tablet  4  . DISCONTD: sulfamethoxazole-trimethoprim (BACTRIM DS,SEPTRA DS) 800-160 MG per tablet Take 1 tablet by mouth 2 (two) times daily.  20 tablet  0  . DISCONTD: valsartan-hydrochlorothiazide (DIOVAN HCT) 320-25 MG per tablet Take 1 tablet by mouth daily.  30 tablet  5    Results for orders placed in visit on 01/05/12 (from the past 48 hour(s))  POCT CBC     Status: Abnormal   Collection Time   01/05/12 11:54 AM      Component Value Range Comment   WBC 8.4  4.6 - 10.2 K/uL    Lymph, poc 1.7  0.6 - 3.4    POC LYMPH PERCENT 20.8  10 - 50 %L    MID (cbc) 0.9  0 - 0.9    POC MID % 10.4  0 - 12 %M    POC Granulocyte 5.8  2 - 6.9    Granulocyte percent 68.8  37 - 80 %G    RBC 4.45  4.04 - 5.48 M/uL    Hemoglobin 11.5 (*) 12.2 - 16.2 g/dL    HCT, POC 45.4 (*) 09.8 - 47.9 %    MCV 83.9  80 - 97 fL    MCH, POC 25.8 (*) 27 - 31.2 pg    MCHC 30.8 (*) 31.8 - 35.4 g/dL    RDW, POC 11.9      Platelet Count, POC 216  142 - 424 K/uL    MPV 8.5  0 - 99.8 fL    No results found.  ROS none There were no vitals taken for this visit. Physical Exam  General: c/f, nad, complaining of left axillary pain. Lungs:  Normal respiratory effort, chest expands symmetrically. Lungs are clear to auscultation, no crackles  or wheezes. Heart - Regular rate and rhythm.  No murmurs, gallops or rubs.    Extremities:   Non-tender, No  cyanosis, edema, or deformity noted. Back: nontender. Left Axilla: cellulitis extending 4 inches away from initial incision site from I+D of abscess, frank pus coming from wound. Pus soaked packing strip removed (1 inch worth). Tender, hot, red, fluctuant with surrounding induration. Left breast normal appearing. No LA. Mouth: MMM Skin: good skin turgor. Psych: AOx3 Neuro: grossly intact.   Assessment/Plan 61 y/o c/f with left axilla abscess not completely drained and surrounding cellulitis with concern for MRSA infection and slight hypotension.   1. Left axilla abscess and cellulitis - I+D at bedside and packing - cultures obtained - IV clindamycin 300 mg q 6 hours - MRSA skin coverage - area demarcated anew with marking pen -morphine 4 mg q 3 hrs prn pain and percocet q 4 hours prn pain - ativan 2 mg x 1 for sedation for procedure - cbc in am - drop in WBC noted from yesterday, then increase today 17 -> 8 -> 20  2. Hypotension - 500 cc bolus followed by 125 cc/hr of NS - Will follow - no history of CHF, lungs clear to auscultation - BMP  3. Dispo Pending improvement.   Edd Arbour MD 01/05/2012, 2:56 PM

## 2012-01-05 NOTE — Patient Instructions (Addendum)
Please go to [facility]. Dr Margot Ables is going to admit you. His pager number is [phone]

## 2012-01-05 NOTE — Progress Notes (Signed)
  Subjective:    Patient ID: Destiny Ballard, female    DOB: Nov 22, 1950, 61 y.o.   MRN: [id number]  HPI Destiny Ballard comes in today for recheck cellulitis of left axilla into left breast.  She states that she is worse today with n/v, fever, shaking chills last pm (tmax 100.5), HA.  She did not pick up her abx until this am but had  Rocephin 1gm IM last pm. The red area marked yesterday has progressed beyond the border marked.     Review of Systems As noted in HPI, otherwise negative     Objective:   Physical Exam  Vitals reviewed. Constitutional: She appears well-developed and well-nourished. She appears ill.  Lymphadenopathy:    She has no axillary adenopathy.  Skin: Skin is warm.          Left axilla with dressing from last night intact.  Erythema beyond marked borders by 4-5 inches onto left breast Tenderness remains.     Results for orders placed in visit on 01/05/12  POCT CBC      Component Value Range   WBC 8.4  4.6 - 10.2 K/uL   Lymph, poc 1.7  0.6 - 3.4   POC LYMPH PERCENT 20.8  10 - 50 %L   MID (cbc) 0.9  0 - 0.9   POC MID % 10.4  0 - 12 %M   POC Granulocyte 5.8  2 - 6.9   Granulocyte percent 68.8  37 - 80 %G   RBC 4.45  4.04 - 5.48 M/uL   Hemoglobin 11.5 (*) 12.2 - 16.2 g/dL   HCT, POC 40.9 (*) 81.1 - 47.9 %   MCV 83.9  80 - 97 fL   MCH, POC 25.8 (*) 27 - 31.2 pg   MCHC 30.8 (*) 31.8 - 35.4 g/dL   RDW, POC 91.4     Platelet Count, POC 216  142 - 424 K/uL   MPV 8.5  0 - 99.8 fL       Assessment & Plan:  Cellulitis, left axilla, worsening. Abscess left axilla  Admit to FPTS for IV abx therapy, Dr. Margot Ables.  Seen with Dr. Cleta Alberts

## 2012-01-05 NOTE — Progress Notes (Signed)
Called and reported to Dr. Konrad Dolores that Destiny Ballard was 3.1 and some of her other labs were abnormal.   He will put orders in for po/IV potassium supplementation. Will also hold Lovenox for now.

## 2012-01-05 NOTE — Progress Notes (Signed)
Patient with low blood pressure 96/40 rechecked  Manually. Pt. Asleep with no other complaints. Dr. Elwyn Reach aware. Ordered NS 1 liter bolus. Will continue to monitor. Lisbeth Ply RN

## 2012-01-05 NOTE — Procedures (Signed)
Procedure Note:  Informed Consent Obtained for I+D of abscess of breast/axilla left side. All Risks, benefits, alternative, complications reviewed with patient and understood. Time Out performed. Using 2%lidocaine, 10 cc's was injected and local anesthesia was obtained. Using a 15 blade a 1 inch incision was extended from the prior 1 cm incision. The cavity was explored bluntly with fingers, hemostats and cotton swabs. All loculations were broken. Copious amounts of pus drained - 60 cc's. The abscess extended one hand breadth posterior down the left chest wall. Cultures were sent of the purulent fluid.  The patient tolerated the procedure well. No complications occurred. Patient counseled on post-operative care. Bleeding: <50 cc's.   Edd Arbour, MD 3:45 PM 01/05/2012

## 2012-01-06 LAB — BASIC METABOLIC PANEL
BUN: 30 mg/dL — ABNORMAL HIGH (ref 6–23)
CO2: 23 mEq/L (ref 19–32)
Calcium: 7.8 mg/dL — ABNORMAL LOW (ref 8.4–10.5)
Chloride: 99 mEq/L (ref 96–112)
Creatinine, Ser: 1.2 mg/dL — ABNORMAL HIGH (ref 0.50–1.10)
GFR calc Af Amer: 55 mL/min — ABNORMAL LOW (ref >90)
GFR calc non Af Amer: 47 mL/min — ABNORMAL LOW (ref >90)
Glucose, Bld: 191 mg/dL — ABNORMAL HIGH (ref 70–99)
Potassium: 3.2 mEq/L — ABNORMAL LOW (ref 3.5–5.1)
Sodium: 132 mEq/L — ABNORMAL LOW (ref 135–145)

## 2012-01-06 LAB — CBC
HCT: 25.6 % — ABNORMAL LOW (ref 36.0–46.0)
HCT: 26.2 % — ABNORMAL LOW (ref 36.0–46.0)
Hemoglobin: 8.3 g/dL — ABNORMAL LOW (ref 12.0–15.0)
Hemoglobin: 8.6 g/dL — ABNORMAL LOW (ref 12.0–15.0)
MCH: 26.1 pg (ref 26.0–34.0)
MCH: 26.4 pg (ref 26.0–34.0)
MCHC: 32.4 g/dL (ref 30.0–36.0)
MCHC: 32.8 g/dL (ref 30.0–36.0)
MCV: 80.5 fL (ref 78.0–100.0)
MCV: 80.4 fL (ref 78.0–100.0)
Platelets: 173 10*3/uL (ref 150–400)
Platelets: 174 10*3/uL (ref 150–400)
RBC: 3.18 MIL/uL — ABNORMAL LOW (ref 3.87–5.11)
RBC: 3.26 MIL/uL — ABNORMAL LOW (ref 3.87–5.11)
RDW: 13.9 % (ref 11.5–15.5)
RDW: 14 % (ref 11.5–15.5)
WBC: 15 10*3/uL — ABNORMAL HIGH (ref 4.0–10.5)
WBC: 11.8 10*3/uL — ABNORMAL HIGH (ref 4.0–10.5)

## 2012-01-06 LAB — TYPE AND SCREEN
ABO/RH(D): A POS
Antibody Screen: NEGATIVE

## 2012-01-06 LAB — ABO/RH: ABO/RH(D): A POS

## 2012-01-06 MED ORDER — SODIUM CHLORIDE 0.9 % IV BOLUS (SEPSIS)
1000.0000 mL | Freq: Once | INTRAVENOUS | Status: AC
  Administered 2012-01-06: 1000 mL via INTRAVENOUS

## 2012-01-06 MED ORDER — POTASSIUM CHLORIDE CRYS ER 20 MEQ PO TBCR
40.0000 meq | EXTENDED_RELEASE_TABLET | Freq: Three times a day (TID) | ORAL | Status: AC
  Administered 2012-01-06 – 2012-01-07 (×2): 40 meq via ORAL
  Filled 2012-01-06 (×3): qty 2

## 2012-01-06 MED ORDER — ACETAMINOPHEN 325 MG PO TABS
650.0000 mg | ORAL_TABLET | Freq: Four times a day (QID) | ORAL | Status: DC | PRN
  Administered 2012-01-06 – 2012-01-09 (×4): 650 mg via ORAL
  Filled 2012-01-06 (×4): qty 2

## 2012-01-06 NOTE — Progress Notes (Signed)
Pt with low blood pressure of 87/49 and asymptomatic. MD making rounds at this time and made aware of this and also Hgb of 8.3 and K+ of 3.2 was also reported at this time.

## 2012-01-06 NOTE — H&P (Signed)
Family Medicine Teaching Service  Nursery Admit Note : Attending Denny Levy MD Pager [phone] Office [phone] I have seen and examined this infant, reviewed their chart and discussed with the resident. Agree with admission. Normal newborn care. Review of systems is negative except for axillary pain and some subjective fever, no chills or rigors. She has had pain in the axilla. PERTINENT  PMH / PSH: Left arm and shoulder injury with hardware remaining in the forearm and some 'scres" in her shoulder.  Existing hardware makes me want to be very very certain we have the correct antibiotic on board and are getting a response befoer d/c. I have discussed this with her and her husband at bedside.

## 2012-01-06 NOTE — Progress Notes (Signed)
Family Medicine Teaching Service [facility] Progress Note  Patient name: Destiny Ballard Medical record number: [id number] Date of birth: 1951-02-15 Age: 61 y.o. Gender: female    LOS: 1 day   Primary Care Provider: Tonye Pearson, MD  Overnight Events:  NAEO. Feels significantly better this am. Tolerating PO w/o n/v. Pain is well controlled  Objective: Vital signs in last 24 hours: Temp:  [96 F (35.6 C)-98.7 F (37.1 C)] 98.4 F (36.9 C) (06/24 0643) Pulse Rate:  [79-90] 90  (06/24 0643) Resp:  [18-20] 18  (06/24 0643) BP: (87-102)/(36-57) 87/39 mmHg (06/24 0643) SpO2:  [90 %-97 %] 93 % (06/24 0643) Weight:  [157 lb 12.8 oz (71.578 kg)-158 lb 8 oz (71.895 kg)] 158 lb 8 oz (71.895 kg) (06/23 1645)  Wt Readings from Last 3 Encounters:  01/05/12 158 lb 8 oz (71.895 kg)  01/05/12 157 lb 12.8 oz (71.578 kg)  01/04/12 157 lb 9.6 oz (71.487 kg)     Current Facility-Administered Medications  Medication Dose Route Frequency Provider Last Rate Last Dose  . 0.9 %  sodium chloride infusion   Intravenous Continuous Edd Arbour, MD 125 mL/hr at 01/06/12 0509    . acetaminophen (TYLENOL) tablet 650 mg  650 mg Oral Q6H PRN Phebe Colla, MD   650 mg at 01/06/12 0335  . albuterol (PROVENTIL HFA;VENTOLIN HFA) 108 (90 BASE) MCG/ACT inhaler 1 puff  1 puff Inhalation Q6H PRN Phebe Colla, MD      . amLODipine (NORVASC) tablet 10 mg  10 mg Oral Daily Phebe Colla, MD      . aspirin EC tablet 81 mg  81 mg Oral Daily Hessie Diener Markle, PHARMD      . atorvastatin (LIPITOR) tablet 40 mg  40 mg Oral q1800 Phebe Colla, MD      . clindamycin (CLEOCIN) IVPB 300 mg  300 mg Intravenous Q6H Edd Arbour, MD   300 mg at 01/06/12 4098  . enoxaparin (LOVENOX) injection 40 mg  40 mg Subcutaneous Q24H Edd Arbour, MD      . FLUoxetine (PROZAC) capsule 40 mg  40 mg Oral Daily Phebe Colla, MD      . fluticasone [facility]) 50 MCG/ACT nasal spray 2 spray  2 spray Each Nare QHS Phebe Colla, MD      .  hydrochlorothiazide (HYDRODIURIL) tablet 25 mg  25 mg Oral Daily Hessie Diener Markle, PHARMD      . ipratropium (ATROVENT) 0.06 % nasal spray 2 spray  2 spray Nasal QID Phebe Colla, MD      . irbesartan (AVAPRO) tablet 300 mg  300 mg Oral Daily Hessie Diener Markle, PHARMD      . loratadine (CLARITIN) tablet 10 mg  10 mg Oral Daily Phebe Colla, MD      . LORazepam (ATIVAN) injection 2 mg  2 mg Intravenous Once Edd Arbour, MD   2 mg at 01/05/12 1515  . metoprolol succinate (TOPROL-XL) 24 hr tablet 100 mg  100 mg Oral Daily Phebe Colla, MD      . morphine 4 MG/ML injection 4 mg  4 mg Intravenous Q3H PRN Edd Arbour, MD   4 mg at 01/05/12 1515  . ondansetron (ZOFRAN) injection 4 mg  4 mg Intravenous Q6H PRN Ozella Rocks, MD      . oxyCODONE-acetaminophen [facility]) 5-325 MG per tablet 1 tablet  1 tablet Oral Q4H PRN Edd Arbour, MD   1 tablet at 01/05/12 2029  .  potassium chloride (K-DUR,KLOR-CON) CR tablet 40 mEq  40 mEq Oral BID Ozella Rocks, MD   40 mEq at 01/05/12 2024  . sodium chloride 0.9 % bolus 1,000 mL  1,000 mL Intravenous Once Phebe Colla, MD   1,000 mL at 01/05/12 2259  . sodium chloride 0.9 % bolus 500 mL  500 mL Intravenous Once Edd Arbour, MD   1,000 mL at 01/05/12 1515  . vitamin B-12 (CYANOCOBALAMIN) tablet 1,000 mcg  1,000 mcg Oral Daily Phebe Colla, MD      . DISCONTD: amLODipine (NORVASC) tablet 10 mg  10 mg Oral Daily Phebe Colla, MD      . DISCONTD: aspirin tablet 81 mg  81 mg Oral Daily Phebe Colla, MD      . DISCONTD: aspirin tablet 81 mg  81 mg Oral Daily Phebe Colla, MD      . DISCONTD: FLUoxetine (PROZAC) capsule 40 mg  40 mg Oral Daily Phebe Colla, MD      . DISCONTD: fluticasone (VERAMYST) nasal spray 2 spray  2 spray Nasal QHS Phebe Colla, MD      . DISCONTD: valsartan-hydrochlorothiazide (DIOVAN-HCT) 320-25 MG per tablet 1 tablet  1 tablet Oral Daily Phebe Colla, MD         PE: BP 87/39  Pulse 90  Temp 98.4 F (36.9 C) (Oral)  Resp  18  Ht 4\' 11"  (1.499 m)  Wt 158 lb 8 oz (71.895 kg)  BMI 32.01 kg/m2  SpO2 93%  Gen: NAD HEENT: MMM CV: Soft heart sounds. RRR Res: CTAB, normal effort. On RA Abd: NABS, non-painful to palpation Ext/Musc: 2+ distal pulses, trace LE edema Neuro: CN grossly intact Skin: Packing in place at drainage site w/o active bleeding or drainage. Area of induration and erythema improving/regressing in all areas except posteriorally towards pt back. Of not pt slept on back entire night. Painful to palpation but less than before.   Labs/Studies:   Lab 01/06/12 0439 01/05/12 1447 01/05/12 1154  WBC 15.0* 20.0* 8.4  HGB 8.3* 10.1* 11.5*  HCT 25.6* 30.7* 37.3*  PLT 173 208 --     Lab 01/06/12 0439 01/05/12 1447  NA 132* 130*  K 3.2* 3.1*  CL 99 91*  CO2 23 29  BUN 30* 30*  CREATININE 1.20* 1.11*  LABGLOM -- --  GLUCOSE 191* --  CALCIUM 7.8* 8.8   Wound culture w/ abundant Staph A.    Assessment/Plan: 61 y/o F with PNHx of HLD, obesity, anemia, HTN CAD, GERD, Cough presenting w/ incompletely drained left axilla abscess adn progressive cellulitis who underwent complete I&D on admission.   Left axilla abscess and cellulitis: Much improved today. Posterior extension of erythema beyond marking likely from position of pt. Afebrile, but continues to by hypotensive (baseline BP of 140 systolic). Pt has received IVF of 144ml/hr since admission and an additional 2, 1L boluses. SBP of 104 the day prior to admission. Initial wound cx showing Staph A. Concern for MRSA. WBC trending down (17 -> 8 -> 20 -->15). Pain well controlled on percocet only. No blood cultures drawn on admission - Continue Clinda until sensitivities return - Packing to remain until tomorrow - folllow change in area of erythema - Trend WBC - DC Morphine - Continue percocet for pain control  Hypotension: Pt still recovering from severe infection. Likely still volume depleted. Pt BP remains low. Normal SBP of 140s per pt.  Antihypertensive medications held. Pt has received IVF as  above - Will administer 1L NS bolus - Continue 125 cc/hr of NS  - continue to monitor VS - Continue to hold home BP medications until BP improves (Amlodipine, HCTZ, ARB, Metop)  Anemia: Likely multifactorial. H/o iron deficiency anemia, blood loss from procedure, and dilutional effect. No evidence of hematoma today on exam or of active bleeding.  - CBC at 1200 - Type and Screen.   FEN/GI: Pt hyponatremic adn hypokalemic on admission. Hyponatremia improving w/ IVF NS. Received Kdur po x1 w/ some improvement. Taking PO but limited.  - Continue IVF NS 172ml/hr as above - Kdur x3 - BMET in am   HLD:  - Continue home Statin  CAD: No complaints of CP. H/o stent placement x1 - Continue Home ASA  Prophylaxis:  - Protonix - SCDs - Lovenox held due to surgical procedure  Dispo: Pending improvement.    LOS 1  Signed: Shelly Flatten, MD Family Medicine Resident PGY-1 [phone] 01/06/2012 8:28 AM

## 2012-01-06 NOTE — Progress Notes (Signed)
I interviewed and examined this patient and discussed the care plan with Dr. Margot Ables and the [facility] team and agree with assessment and plan as documented in the progress note for today. Her hemoglobin has stabilized and WBC has normalized. She is feeling better, but says her pain is being underdosed due to the hypotension.    Destiny Ballard A. Sheffield Slider, MD Family Medicine Teaching Service Attending  01/06/2012 3:31 PM

## 2012-01-07 ENCOUNTER — Encounter: Payer: No Typology Code available for payment source | Admitting: Nurse Practitioner

## 2012-01-07 LAB — BASIC METABOLIC PANEL
BUN: 14 mg/dL (ref 6–23)
CO2: 24 mEq/L (ref 19–32)
Calcium: 8.4 mg/dL (ref 8.4–10.5)
Chloride: 103 mEq/L (ref 96–112)
Creatinine, Ser: 0.69 mg/dL (ref 0.50–1.10)
GFR calc Af Amer: >90 mL/min (ref >90)
GFR calc non Af Amer: >90 mL/min (ref >90)
Glucose, Bld: 135 mg/dL — ABNORMAL HIGH (ref 70–99)
Potassium: 4.2 mEq/L (ref 3.5–5.1)
Sodium: 135 mEq/L (ref 135–145)

## 2012-01-07 LAB — CBC
HCT: 27 % — ABNORMAL LOW (ref 36.0–46.0)
Hemoglobin: 8.8 g/dL — ABNORMAL LOW (ref 12.0–15.0)
MCH: 26 pg (ref 26.0–34.0)
MCHC: 32.6 g/dL (ref 30.0–36.0)
MCV: 79.9 fL (ref 78.0–100.0)
Platelets: 170 10*3/uL (ref 150–400)
RBC: 3.38 MIL/uL — ABNORMAL LOW (ref 3.87–5.11)
RDW: 14 % (ref 11.5–15.5)
WBC: 9.8 10*3/uL (ref 4.0–10.5)

## 2012-01-07 LAB — WOUND CULTURE
Gram Stain: NONE SEEN
Gram Stain: NONE SEEN

## 2012-01-07 MED ORDER — CLINDAMYCIN PHOSPHATE 300 MG/50ML IV SOLN
300.0000 mg | Freq: Four times a day (QID) | INTRAVENOUS | Status: DC
  Administered 2012-01-07 – 2012-01-09 (×7): 300 mg via INTRAVENOUS
  Filled 2012-01-07 (×9): qty 50

## 2012-01-07 MED ORDER — CLINDAMYCIN HCL 300 MG PO CAPS
300.0000 mg | ORAL_CAPSULE | Freq: Four times a day (QID) | ORAL | Status: DC
  Administered 2012-01-07: 300 mg via ORAL
  Filled 2012-01-07 (×4): qty 1

## 2012-01-07 MED ORDER — DIPHENHYDRAMINE HCL 25 MG PO CAPS: 25.0000 mg | ORAL_CAPSULE | Freq: Four times a day (QID) | ORAL | Status: DC | PRN

## 2012-01-07 NOTE — Progress Notes (Signed)
Received call from RN that patient was having increasing warmth and redness spreading to her left breast.  Went to see patient and L breast was erythematous and warm along side and underneath extending to areola.  Area non tender without fluctuance. Patient complaining of some itching. Looks to be more cellulitic.  Will restart IV clindamycin and monitor, benadryl for itching.

## 2012-01-07 NOTE — Progress Notes (Signed)
I interviewed and examined this patient and discussed the care plan with Dr. Margot Ables and the [facility] team and agree with assessment and plan as documented in the progress note for today. She reports a chronic tendency to loose stools. I warned her to promptly report the development of more significant diarrhea.    Destiny Ballard A. Sheffield Slider, MD Family Medicine Teaching Service Attending  01/07/2012 5:17 PM

## 2012-01-07 NOTE — Progress Notes (Signed)
Family Medicine Teaching Service [facility] Progress Note  Patient name: Destiny Ballard Medical record number: [id number] Date of birth: 07-16-1950 Age: 61 y.o. Gender: female    LOS: 2 days   Primary Care Provider: Tonye Pearson, MD  Overnight Events:  Feels significantly better today. Pain is well controlled. Still Dizzy on ambulation.   Objective: Vital signs in last 24 hours: Temp:  [97.8 F (36.6 C)-98.3 F (36.8 C)] 97.8 F (36.6 C) (06/25 0550) Pulse Rate:  [79-93] 93  (06/25 0550) Resp:  [17-20] 18  (06/25 0550) BP: (94-106)/(44-57) 106/52 mmHg (06/25 0550) SpO2:  [94 %-95 %] 95 % (06/25 0550)  Wt Readings from Last 3 Encounters:  01/05/12 158 lb 8 oz (71.895 kg)  01/05/12 157 lb 12.8 oz (71.578 kg)  01/04/12 157 lb 9.6 oz (71.487 kg)     Current Facility-Administered Medications  Medication Dose Route Frequency Provider Last Rate Last Dose  . 0.9 %  sodium chloride infusion   Intravenous Continuous Edd Arbour, MD 125 mL/hr at 01/07/12 0021    . acetaminophen (TYLENOL) tablet 650 mg  650 mg Oral Q6H PRN Phebe Colla, MD   650 mg at 01/06/12 0335  . albuterol (PROVENTIL HFA;VENTOLIN HFA) 108 (90 BASE) MCG/ACT inhaler 1 puff  1 puff Inhalation Q6H PRN Phebe Colla, MD      . amLODipine (NORVASC) tablet 10 mg  10 mg Oral Daily Phebe Colla, MD      . aspirin EC tablet 81 mg  81 mg Oral Daily Hessie Diener Markle, PHARMD   81 mg at 01/06/12 1014  . atorvastatin (LIPITOR) tablet 40 mg  40 mg Oral q1800 Phebe Colla, MD   40 mg at 01/06/12 1734  . clindamycin (CLEOCIN) IVPB 300 mg  300 mg Intravenous Q6H Edd Arbour, MD   300 mg at 01/07/12 1478  . enoxaparin (LOVENOX) injection 40 mg  40 mg Subcutaneous Q24H Edd Arbour, MD   40 mg at 01/07/12 0021  . FLUoxetine (PROZAC) capsule 40 mg  40 mg Oral Daily Phebe Colla, MD   40 mg at 01/06/12 1013  . fluticasone (FLONASE) 50 MCG/ACT nasal spray 2 spray  2 spray Each Nare QHS Phebe Colla, MD      .  hydrochlorothiazide (HYDRODIURIL) tablet 25 mg  25 mg Oral Daily Hessie Diener Markle, PHARMD      . ipratropium (ATROVENT) 0.06 % nasal spray 2 spray  2 spray Nasal QID Phebe Colla, MD   2 spray at 01/07/12 0023  . irbesartan (AVAPRO) tablet 300 mg  300 mg Oral Daily Hessie Diener Markle, PHARMD      . loratadine (CLARITIN) tablet 10 mg  10 mg Oral Daily Phebe Colla, MD   10 mg at 01/06/12 1010  . metoprolol succinate (TOPROL-XL) 24 hr tablet 100 mg  100 mg Oral Daily Phebe Colla, MD      . ondansetron [facility]) injection 4 mg  4 mg Intravenous Q6H PRN Ozella Rocks, MD      . oxyCODONE-acetaminophen (PERCOCET) 5-325 MG per tablet 1 tablet  1 tablet Oral Q4H PRN Edd Arbour, MD   1 tablet at 01/07/12 [phone]  . potassium chloride (K-DUR,KLOR-CON) CR tablet 40 mEq  40 mEq Oral BID Ozella Rocks, MD   40 mEq at 01/06/12 0846  . potassium chloride SA (K-DUR,KLOR-CON) CR tablet 40 mEq  40 mEq Oral TID Ozella Rocks, MD   40 mEq at 01/07/12 0022  .  sodium chloride 0.9 % bolus 1,000 mL  1,000 mL Intravenous Once Ozella Rocks, MD   1,000 mL at 01/06/12 0845  . vitamin B-12 (CYANOCOBALAMIN) tablet 1,000 mcg  1,000 mcg Oral Daily Phebe Colla, MD   1,000 mcg at 01/06/12 1011  . DISCONTD: morphine 4 MG/ML injection 4 mg  4 mg Intravenous Q3H PRN Edd Arbour, MD   4 mg at 01/05/12 1515     PE: Gen: NAD HEENT: MMM CV: RRR Res: CTAB Ext/Musc: 2+ pulses, no edema Neuro: CN grossly intact Skin:  Erythema and induration well within the lines of demarcation, no purulent discharge from incision site.  Labs/Studies:   Lab 01/07/12 0655 01/06/12 0439 01/05/12 1447  NA 135 132* 130*  K 4.2 3.2* 3.1*  CL 103 99 91*  CO2 24 23 29   BUN 14 30* 30*  CREATININE 0.69 1.20* 1.11*  LABGLOM -- -- --  GLUCOSE 135* -- --  CALCIUM 8.4 7.8* 8.8     Lab 01/07/12 0655 01/06/12 1308 01/06/12 0439  WBC 9.8 11.8* 15.0*  HGB 8.8* 8.6* 8.3*  HCT 27.0* 26.2* 25.6*  PLT 170 174 173      Assessment/Plan: 61 y/o F with PNHx of HLD, obesity, anemia, HTN CAD, GERD, Cough presenting w/ incompletely drained left axilla abscess adn progressive cellulitis who underwent complete I&D on admission.   Left axilla abscess and cellulitis: Much improved today. Afebrile. Hypotension improving (baseline BP of 140 systolic). Receiving IVF of 146ml/hr. Cx showing MRSA, sensitive to clinda. WBC trending down (17 -> 8 -> 20 -->15--9.8). Pain well controlled on percocet only. 1/2 of packing removed today - Will change to PO Clinda  - Will remove second half of packing tomorrow  - folllow change in area of erythema  - Trend WBC  - Continue percocet for pain control   Hypotension: Pt still recovering from severe infection. Improving. Pt BP remains low. Normal SBP of 140s per pt. Antihypertensive medications held. Pt has received IVF as above  - Continue 125 cc/hr of NS  - continue to monitor VS  - Continue to hold home BP medications until BP improves (Amlodipine, HCTZ, ARB, Metop)   Anemia: Improving Likely multifactorial. H/o iron deficiency anemia, blood loss from procedure, and dilutional effect. No evidence of hematoma today on exam or of active bleeding.  - CBC in am  FEN/GI: HypoK and HypoNA normal today. PO is improving w/o n/v. continue regular diet - Continue IVF NS 183ml/hr as above   HLD:  - Continue home Statin   CAD: No complaints of CP. H/o stent placement x1  - Continue Home ASA   Prophylaxis:  - Protonix  - SCDs  - Lovenox held due to surgical procedure   Dispo:  Pending improvement.       LOS 2  Signed: Shelly Flatten, MD Family Medicine Resident PGY-1 [phone] 01/07/2012 7:53 AM

## 2012-01-08 ENCOUNTER — Encounter: Payer: Self-pay | Admitting: Family Medicine

## 2012-01-08 LAB — WOUND CULTURE: Gram Stain: NONE SEEN

## 2012-01-08 LAB — CBC
HCT: 26.1 % — ABNORMAL LOW (ref 36.0–46.0)
Hemoglobin: 8.5 g/dL — ABNORMAL LOW (ref 12.0–15.0)
MCH: 25.7 pg — ABNORMAL LOW (ref 26.0–34.0)
MCHC: 32.6 g/dL (ref 30.0–36.0)
MCV: 78.9 fL (ref 78.0–100.0)
Platelets: 216 10*3/uL (ref 150–400)
RBC: 3.31 MIL/uL — ABNORMAL LOW (ref 3.87–5.11)
RDW: 13.7 % (ref 11.5–15.5)
WBC: 10.2 10*3/uL (ref 4.0–10.5)

## 2012-01-08 MED ORDER — RISAQUAD PO CAPS
1.0000 | ORAL_CAPSULE | Freq: Every day | ORAL | Status: DC
  Administered 2012-01-08 – 2012-01-09 (×2): 1 via ORAL
  Filled 2012-01-08 (×2): qty 1

## 2012-01-08 MED ORDER — HYDROCODONE-ACETAMINOPHEN 5-325 MG PO TABS
1.0000 | ORAL_TABLET | ORAL | Status: DC | PRN
  Administered 2012-01-08 – 2012-01-09 (×4): 1 via ORAL
  Filled 2012-01-08 (×5): qty 1

## 2012-01-08 MED ORDER — LORAZEPAM BOLUS VIA INFUSION
1.0000 mg | Freq: Once | INTRAVENOUS | Status: DC
Start: 2012-01-08 — End: 2012-01-08

## 2012-01-08 MED ORDER — MORPHINE SULFATE 4 MG/ML IJ SOLN
4.0000 mg | Freq: Once | INTRAMUSCULAR | Status: AC
  Administered 2012-01-08: 4 mg via INTRAVENOUS
  Filled 2012-01-08: qty 1

## 2012-01-08 MED ORDER — LORAZEPAM 2 MG/ML IJ SOLN
1.0000 mg | Freq: Once | INTRAMUSCULAR | Status: AC
  Administered 2012-01-08: 1 mg via INTRAVENOUS
  Filled 2012-01-08: qty 1

## 2012-01-08 NOTE — Progress Notes (Signed)
Family Medicine Teaching Service [facility] Progress Note  Patient name: Destiny Ballard Medical record number: [id number] Date of birth: 10/08/1950 Age: 61 y.o. Gender: female    LOS: 3 days   Primary Care Provider: Tonye Pearson, MD  Overnight Events:  Celulitis progressed beyond markings yesterday w/ increased wamrth and pain. After placed back on IV clinda redness and pain reduced. Feeling better overall today. Percocet makes pt feel loopy and would like something milder. Takes Norco at home.   Objective: Vital signs in last 24 hours: Temp:  [98.1 F (36.7 C)-100.7 F (38.2 C)] 98.1 F (36.7 C) (06/26 0512) Pulse Rate:  [81-90] 87  (06/26 0512) Resp:  [18-20] 20  (06/26 0512) BP: (104-127)/(55-56) 127/55 mmHg (06/26 0512) SpO2:  [93 %-97 %] 93 % (06/26 0512)  Wt Readings from Last 3 Encounters:  01/05/12 158 lb 8 oz (71.895 kg)  01/05/12 157 lb 12.8 oz (71.578 kg)  01/04/12 157 lb 9.6 oz (71.487 kg)     Current Facility-Administered Medications  Medication Dose Route Frequency Provider Last Rate Last Dose  . 0.9 %  sodium chloride infusion   Intravenous Continuous Edd Arbour, MD 125 mL/hr at 01/07/12 2343    . acetaminophen (TYLENOL) tablet 650 mg  650 mg Oral Q6H PRN Phebe Colla, MD   650 mg at 01/07/12 1537  . albuterol (PROVENTIL HFA;VENTOLIN HFA) 108 (90 BASE) MCG/ACT inhaler 1 puff  1 puff Inhalation Q6H PRN Phebe Colla, MD      . amLODipine (NORVASC) tablet 10 mg  10 mg Oral Daily Phebe Colla, MD   10 mg at 01/07/12 0954  . aspirin EC tablet 81 mg  81 mg Oral Daily Hessie Diener [place], PHARMD   81 mg at 01/07/12 0954  . atorvastatin (LIPITOR) tablet 40 mg  40 mg Oral q1800 Phebe Colla, MD   40 mg at 01/07/12 1715  . clindamycin (CLEOCIN) IVPB 300 mg  300 mg Intravenous Q6H Everrett Coombe, DO   300 mg at 01/08/12 0559  . diphenhydrAMINE (BENADRYL) capsule 25 mg  25 mg Oral Q6H PRN Everrett Coombe, DO      . enoxaparin (LOVENOX) injection 40 mg  40 mg  Subcutaneous Q24H Edd Arbour, MD   40 mg at 01/07/12 1944  . FLUoxetine (PROZAC) capsule 40 mg  40 mg Oral Daily Phebe Colla, MD   40 mg at 01/07/12 0953  . fluticasone (FLONASE) 50 MCG/ACT nasal spray 2 spray  2 spray Each Nare QHS Phebe Colla, MD      . hydrochlorothiazide (HYDRODIURIL) tablet 25 mg  25 mg Oral Daily Hessie Diener [place], PHARMD   25 mg at 01/07/12 0953  . HYDROcodone-acetaminophen (NORCO) 5-325 MG per tablet 1 tablet  1 tablet Oral Q4H PRN Ozella Rocks, MD      . ipratropium (ATROVENT) 0.06 % nasal spray 2 spray  2 spray Nasal QID Phebe Colla, MD   2 spray at 01/07/12 1412  . irbesartan (AVAPRO) tablet 300 mg  300 mg Oral Daily Hessie Diener [place]aneberry, PHARMD   300 mg at 01/07/12 0954  . loratadine (CLARITIN) tablet 10 mg  10 mg Oral Daily Phebe Colla, MD   10 mg at 01/07/12 0954  . metoprolol succinate (TOPROL-XL) 24 hr tablet 100 mg  100 mg Oral Daily Phebe Colla, MD   100 mg at 01/07/12 0953  . ondansetron (ZOFRAN) injection 4 mg  4 mg Intravenous Q6H PRN Ozella Rocks,  MD      . potassium chloride SA (K-DUR,KLOR-CON) CR tablet 40 mEq  40 mEq Oral TID Ozella Rocks, MD   40 mEq at 01/07/12 0022  . vitamin B-12 (CYANOCOBALAMIN) tablet 1,000 mcg  1,000 mcg Oral Daily Phebe Colla, MD   1,000 mcg at 01/07/12 0954  . DISCONTD: clindamycin (CLEOCIN) capsule 300 mg  300 mg Oral Q6H Ozella Rocks, MD   300 mg at 01/07/12 1246  . DISCONTD: clindamycin (CLEOCIN) IVPB 300 mg  300 mg Intravenous Q6H Edd Arbour, MD   300 mg at 01/07/12 4098  . DISCONTD: oxyCODONE-acetaminophen (PERCOCET) 5-325 MG per tablet 1 tablet  1 tablet Oral Q4H PRN Edd Arbour, MD   1 tablet at 01/08/12 [phone]     PE:  Gen: NAD  HEENT: MMM  CV: RRR  Res: CTAB  Ext/Musc: 2+ pulses, no edema  Neuro: CN grossly intact  Skin: Erythema and induration well within the lines of demarcation, no purulent discharge from incision site. Painful on palpation. Packing remains in place.     Labs/Studies:   Lab 01/08/12 0500 01/07/12 0655 01/06/12 1308  WBC 10.2 9.8 11.8*  HGB 8.5* 8.8* 8.6*  HCT 26.1* 27.0* 26.2*  PLT 216 170 174     Lab 01/07/12 0655 01/06/12 0439 01/05/12 1447  NA 135 132* 130*  K 4.2 3.2* 3.1*  CL 103 99 91*  CO2 24 23 29   BUN 14 30* 30*  CREATININE 0.69 1.20* 1.11*  LABGLOM -- -- --  GLUCOSE 135* -- --  CALCIUM 8.4 7.8* 8.8     Assessment/Plan: 61 y/o F with PNHx of HLD, obesity, anemia, HTN CAD, GERD, Cough presenting w/ incompletely drained left axilla abscess adn progressive cellulitis who underwent complete I&D on admission.   Left axilla abscess and cellulitis: Much improved today. Afebrile. Hypotension improving (baseline BP of 140 systolic). Receiving IVF of 158ml/hr. Cx showing MRSA, sensitive to clinda. WBC trending down (17 -> 8 -> 20 -->15--9.8-->8.6-->8.5). Pain well controlled on percocet only. 1/2 of packing removed today. - Continue on IV Clinda which was restarted yesterday  - Will remove second half of packing tomorrow  - folllow change in area of erythema  - Trend WBC  - Change percocet to norco for pain control   Hypotension: Pt still recovering from severe infection. Improving. Pt BP remains low. Normal SBP of 140s per pt. Antihypertensive medications held. Pt has received IVF as above.  - Continue 125 cc/hr of NS  - continue to monitor VS  - Continue to hold home BP medications until BP improves (Amlodipine, HCTZ, ARB, Metop)   Anemia: Improving Stable. H/o iron deficiency anemia, blood loss from procedure, and dilutional effect. No evidence of hematoma today on exam or of active bleeding.   FEN/GI: HypoK and HypoNA normal today. PO is improving w/o n/v. continue regular diet  - Continue IVF NS 150ml/hr as above   HLD:  - Continue home Statin   CAD: No complaints of CP. H/o stent placement x1  - Continue Home ASA   Prophylaxis:  - Protonix  - SCDs  - Lovenox held due to surgical procedure   Dispo:   Pending improvement.    LOS 3  Signed: Shelly Flatten, MD Family Medicine Resident PGY-1 [phone] 01/08/2012 9:04 AM

## 2012-01-08 NOTE — Progress Notes (Signed)
I discussed the care plan with Dr. Margot Ables and the [facility] team and agree with assessment and plan as documented in the progress note for today.    Slade Pierpoint A. Sheffield Slider, MD Family Medicine Teaching Service Attending  01/08/2012 5:24 PM

## 2012-01-09 MED ORDER — HYDROCODONE-ACETAMINOPHEN 5-500 MG PO TABS: 1.0000 | ORAL_TABLET | ORAL | Status: AC | PRN

## 2012-01-09 MED ORDER — RISAQUAD PO CAPS: 1.0000 | ORAL_CAPSULE | Freq: Every day | ORAL | Status: DC

## 2012-01-09 MED ORDER — METOPROLOL SUCCINATE ER 100 MG PO TB24: 100.0000 mg | ORAL_TABLET | Freq: Every day | ORAL | Status: DC

## 2012-01-09 MED ORDER — CLINDAMYCIN HCL 300 MG PO CAPS
300.0000 mg | ORAL_CAPSULE | Freq: Four times a day (QID) | ORAL | Status: DC
  Administered 2012-01-09: 300 mg via ORAL
  Filled 2012-01-09 (×4): qty 1

## 2012-01-09 MED ORDER — CLINDAMYCIN HCL 300 MG PO CAPS: 300.0000 mg | ORAL_CAPSULE | Freq: Four times a day (QID) | ORAL | Status: DC

## 2012-01-09 NOTE — Progress Notes (Signed)
Family Medicine Teaching Service [facility] Progress Note  Patient name: Destiny Ballard Medical record number: [id number] Date of birth: April 14, 1951 Age: 61 y.o. Gender: female    LOS: 4 days   Primary Care Provider: Tonye Pearson, MD  Overnight Events:  NAEO. Feeling well this am. Ambulating w/o difficulty  Objective: Vital signs in last 24 hours: Temp:  [98.3 F (36.8 C)-98.5 F (36.9 C)] 98.4 F (36.9 C) (06/27 0612) Pulse Rate:  [73-86] 73  (06/27 0612) Resp:  [16-18] 18  (06/27 0612) BP: (102-125)/(58-59) 125/59 mmHg (06/27 0612) SpO2:  [92 %-95 %] 95 % (06/27 0612)  Wt Readings from Last 3 Encounters:  01/05/12 158 lb 8 oz (71.895 kg)  01/05/12 157 lb 12.8 oz (71.578 kg)  01/04/12 157 lb 9.6 oz (71.487 kg)     Current Facility-Administered Medications  Medication Dose Route Frequency Provider Last Rate Last Dose  . 0.9 %  sodium chloride infusion   Intravenous Continuous Edd Arbour, MD 125 mL/hr at 01/09/12 0114    . acetaminophen (TYLENOL) tablet 650 mg  650 mg Oral Q6H PRN Phebe Colla, MD   650 mg at 01/09/12 0114  . acidophilus (RISAQUAD) capsule 1 capsule  1 capsule Oral Daily Ozella Rocks, MD   1 capsule at 01/09/12 0946  . albuterol (PROVENTIL HFA;VENTOLIN HFA) 108 (90 BASE) MCG/ACT inhaler 1 puff  1 puff Inhalation Q6H PRN Phebe Colla, MD      . amLODipine (NORVASC) tablet 10 mg  10 mg Oral Daily Phebe Colla, MD   10 mg at 01/09/12 0946  . aspirin EC tablet 81 mg  81 mg Oral Daily Hessie Diener [place], PHARMD   81 mg at 01/09/12 0946  . atorvastatin (LIPITOR) tablet 40 mg  40 mg Oral q1800 Phebe Colla, MD   40 mg at 01/08/12 1812  . clindamycin (CLEOCIN) capsule 300 mg  300 mg Oral Q6H Ozella Rocks, MD   300 mg at 01/09/12 1034  . diphenhydrAMINE (BENADRYL) capsule 25 mg  25 mg Oral Q6H PRN Everrett Coombe, DO      . enoxaparin (LOVENOX) injection 40 mg  40 mg Subcutaneous Q24H Edd Arbour, MD   40 mg at 01/08/12 2058  . FLUoxetine (PROZAC)  capsule 40 mg  40 mg Oral Daily Phebe Colla, MD   40 mg at 01/09/12 0945  . fluticasone (FLONASE) 50 MCG/ACT nasal spray 2 spray  2 spray Each Nare QHS Phebe Colla, MD   2 spray at 01/08/12 2058  . hydrochlorothiazide (HYDRODIURIL) tablet 25 mg  25 mg Oral Daily Hessie Diener [place], PHARMD   25 mg at 01/09/12 0946  . HYDROcodone-acetaminophen (NORCO) 5-325 MG per tablet 1 tablet  1 tablet Oral Q4H PRN Ozella Rocks, MD   1 tablet at 01/09/12 1034  . ipratropium (ATROVENT) 0.06 % nasal spray 2 spray  2 spray Nasal QID Phebe Colla, MD   2 spray at 01/07/12 1412  . irbesartan (AVAPRO) tablet 300 mg  300 mg Oral Daily Hessie Diener [place], PHARMD   300 mg at 01/09/12 0945  . loratadine (CLARITIN) tablet 10 mg  10 mg Oral Daily Phebe Colla, MD   10 mg at 01/09/12 0946  . metoprolol succinate (TOPROL-XL) 24 hr tablet 100 mg  100 mg Oral Daily Phebe Colla, MD   100 mg at 01/09/12 0946  . ondansetron (ZOFRAN) injection 4 mg  4 mg Intravenous Q6H PRN Ozella Rocks, MD      .  vitamin B-12 (CYANOCOBALAMIN) tablet 1,000 mcg  1,000 mcg Oral Daily Phebe Colla, MD   1,000 mcg at 01/09/12 0946  . DISCONTD: clindamycin (CLEOCIN) IVPB 300 mg  300 mg Intravenous Q6H Everrett Coombe, DO   300 mg at 01/09/12 0541     PE: Gen: NAD  HEENT: MMM  CV: RRR  Res: CTAB  Ext/Musc: 2+ pulses, no edema  Neuro: CN grossly intact  Skin: Erythema and induration well within the lines of demarcation, no purulent discharge from incision site. Painful on palpation. Packing removed   Labs/Studies:  none  Assessment/Plan: 61 y/o F with PNHx of HLD, obesity, anemia, HTN CAD, GERD, Cough presenting w/ incompletely drained left axilla abscess adn progressive cellulitis who underwent complete I&D on admission.   Left axilla abscess and cellulitis: Much improved today. Afebrile. Hypotension improving (baseline BP of 140 systolic). Receiving IVF of 129ml/hr. Cx showing MRSA, sensitive to clinda. WBC trending down (17 -> 8 ->  20 -->15--9.8-->8.6-->8.5). All packing removed today  - Changed back to PO Clinda today   - folllow change in area of erythema  - norco for pain control   Hypotension: Pt still recovering from severe infection. Improving. Pt BP remains in 120s. Normal SBP of 140s per pt. Will add back metoprolol today. Pt has received IVF as above.  - SLIV - continue to monitor VS  - Continue to hold home BP medications until BP improves (Amlodipine, HCTZ, ARB, Metop)   Anemia: Improving Stable. H/o iron deficiency anemia, blood loss from procedure, and dilutional effect. No evidence of hematoma today on exam or of active bleeding.   FEN/GI: HypoK and HypoNA normal today. PO is improving w/o n/v. continue regular diet  - Continue IVF NS 181ml/hr as above   HLD:  - Continue home Statin   CAD: No complaints of CP. H/o stent placement x1  - Continue Home ASA   Prophylaxis:  - Protonix  - SCDs - Lovenox   Dispo:  Likely DC today   LOS 4  Signed: Shelly Flatten, MD Family Medicine Resident PGY-1 [phone] 01/09/2012 12:35 PM

## 2012-01-09 NOTE — Progress Notes (Signed)
I discussed the care plan with Dr. Konrad Dolores and the [facility] team and agree with assessment and plan as documented in the progress note for today.    Gregorey Nabor A. Sheffield Slider, MD Family Medicine Teaching Service Attending  01/09/2012 5:27 PM

## 2012-01-09 NOTE — Discharge Instructions (Addendum)
You were admitted due to a severe bacterial infection. Please continue to take your antibiotics as prescribed. Please follow up with [facility] sometime in the next week or so.  Abscess An abscess (boil or furuncle) is an infected area under your skin. This area is filled with yellowish white fluid (pus). HOME CARE   Only take medicine as told by your doctor.   Keep the skin clean around your abscess. Keep clothes that may touch the abscess clean.   Change any bandages (dressings) as told by your doctor.   Avoid direct skin contact with other people. The infection can spread by skin contact with others.   Practice good hygiene and do not share personal care items.   Do not share athletic equipment, towels, or whirlpools. Shower after every practice or work out session.   If a draining area cannot be covered:   Do not play sports.   Children should not go to daycare until the wound has healed or until fluid (drainage) stops coming out of the wound.   See your doctor for a follow-up visit as told.  GET HELP RIGHT AWAY IF:   There is more pain, puffiness (swelling), and redness in the wound site.   There is fluid or bleeding from the wound site.   You have muscle aches, chills, fever, or feel sick.   You or your child has a temperature by mouth above 102 F (38.9 C), not controlled by medicine.   Your baby is older than 3 months with a rectal temperature of 102 F (38.9 C) or higher.  MAKE SURE YOU:   Understand these instructions.   Will watch your condition.   Will get help right away if you are not doing well or get worse.  Document Released: 12/18/2007 Document Revised: 06/20/2011 Document Reviewed: 12/18/2007 [facility] Patient Information 2012 [place], [place].   Cellulitis Cellulitis is an infection of the tissue under the skin. The infected area is usually red and tender. This is caused by germs. These germs enter the body through cuts or sores. This  usually happens in the arms or lower legs. HOME CARE   Take your medicine as told. Finish it even if you start to feel better.   If the infection is on the arm or leg, keep it raised (elevated).   Use a warm cloth on the infected area several times a day.   See your doctor for a follow-up visit as told.  GET HELP RIGHT AWAY IF:   You are tired or confused.   You throw up (vomit).   You have watery poop (diarrhea).   You feel ill and have muscle aches.   You have a fever.  MAKE SURE YOU:   Understand these instructions.   Will watch your condition.   Will get help right away if you are not doing well or get worse.  Document Released: 12/18/2007 Document Revised: 06/20/2011 Document Reviewed: 06/02/2009 [facility] Patient Information 2012 [place], [place].

## 2012-01-09 NOTE — Discharge Summary (Signed)
Family Medicine Resident Discharge Summary  Patient ID: Destiny Ballard [id number] 60 y.o. August 30, 1950  Admit date: 01/05/2012  Discharge date and time: 01/09/2012  5:06 PM   Admitting Physician: Nestor Ramp, MD   Discharge Physician: Zachery Dauer, MD  Admission Diagnoses: Cellulitis  Discharge Diagnoses: Cellulitis, Abscess  Admission Condition: fair  Discharged Condition: good  Indication for Admission: Abscess s/p incomplete I&D  Hospital Course: 61 y/o F with PMHx of HLD, obesity, anemia, HTN CAD, GERD, and cough who presented w/ incompletely drained left axilla abscess and progressive cellulitis who underwent complete I&D on admission.   Abscess: Pt admitted w/ partially drained abscess, and extensive cellulitis while on Bactrim. Initially noted to have approximately 1inch of packing in wound w/ purulent drainage. Erythema and induration beyond the initial line of demarcation drawn the day prior. Pt given ativan and morphine for sedation prior to I&D. Pt tolerated I&D well and remaining abscess pocket was opened and drained to a depth of approximately 4-5in. Wound was packed w/ iodiform gauze and started on IV Clindamycin until further identification and sensitivities could be obtained. Iodiform gauze removed over the next 2 days. Cultures came back w/ MRSA and pt changed to PO clinda. Worsening of cellulitis on PO clinda so transitioned back to IV clinda w/ repacking of wound w/ iodiform gauze. Removed and place back on PO the following day. cellulitis significantly improved at time of DC.  Pt was noted to be hypotensive on admission w/ a SBP of around 90 at time of admission w/ baseline SBP of around 140. Pt given 3 NS boluses and placed on IVF of NS 152ml/hr w/ improvement in BP. Fluids were decreased as PO improved and BP normalized. Pt was afebrile w/ a normal HR and RR, and it remained normal throughout her admission.   Hypokalemia: Pt noted to be hypokalemic to 3.1 on admission.  Repleated w/ KDUR and was normal at time of DC. Asymptomatic.   Anemia: Pt w/ h/o iron deficiency anemia on admission. This dropped from 11.5 to 8.3 during admission. THis was likely due to dilution from fluids and blood loss from I&D. Type and screen obtained but not transfused. Hgb stable at 8.6 at time of DC and w/o orthostatic signs or symptoms.   CAD: H/O CAD noted in chart on admission. No CP during admission. Continued on home ASA and Statin.   Consults: none  Significant Diagnostic Studies:  Lab  01/06/12 0439  01/05/12 1447  01/05/12 1154   WBC  15.0*  20.0*  8.4   HGB  8.3*  10.1*  11.5*   HCT  25.6*  30.7*  37.3*   PLT  173  208  --     Lab  01/06/12 0439  01/05/12 1447   NA  132*  130*   K  3.2*  3.1*   CL  99  91*   CO2  23  29   BUN  30*  30*   CREATININE  1.20*  1.11*   LABGLOM  --  --   GLUCOSE  191*  --   CALCIUM  7.8*  8.8     Lab  01/08/12 0500  01/07/12 0655  01/06/12 1308   WBC  10.2  9.8  11.8*   HGB  8.5*  8.8*  8.6*   HCT  26.1*  27.0*  26.2*   PLT  216  170  174     Lab  01/07/12 0655  01/06/12 0439  01/05/12 1447  NA  135  132*  130*   K  4.2  3.2*  3.1*   CL  103  99  91*   CO2  24  23  29    BUN  14  30*  30*   CREATININE  0.69  1.20*  1.11*   LABGLOM  --  --  --   GLUCOSE  135*  --  --   CALCIUM  8.4  7.8*  8.8     Wound Culture   Component  Value   Culture  Abundant METHICILLIN RESISTANT STAPHYLOCOCCUS AUREUS   GRAM STAIN  No WBC Seen   GRAM STAIN  No Squamous Epithelial Cells Seen   GRAM STAIN  Moderate Gram Positive Cocci In Pairs In Clusters   Organism ID, Bacteria  METHICILLIN RESISTANT STAPHYLOCOCCUS AUREUS   Comments:   Rifampin and Gentamicin should not be used as single drugs for treatment of Staph infections. This organism DOES NOT demonstrate inducible Clindamycin resistance in vitro.   Resulting Agency  [facility]    Culture & Susceptibility     METHICILLIN RESISTANT STAPHYLOCOCCUS AUREUS          Antibiotic   Sensitivity  Microscan  Status      CEFAZOLIN  Resistant   Final      CIPROFLOXACIN  Sensitive  <=0.5  Final      CLINDAMYCIN  Sensitive  <=0.25  Final      ERYTHROMYCIN  Resistant  >=8  Final      GENTAMICIN  Sensitive  <=0.5  Final      LEVOFLOXACIN  Sensitive  0.25  Final      LINEZOLID  Sensitive  1  Final      OXACILLIN  Resistant  >=4  Final      PENICILLIN  Resistant  >=0.5  Final      RIFAMPIN  Sensitive  <=0.5  Final      TETRACYCLINE  Sensitive  <=1  Final      TRIMETH/SULFA  Sensitive  <=10  Final      VANCOMYCIN  Sensitive  1  Final     Discharge Exam: Gen: NAD  HEENT: MMM  CV: RRR  Res: CTAB  Ext/Musc: 2+ pulses, no edema  Neuro: CN grossly intact  Skin: Erythema and induration well within the lines of demarcation, no purulent discharge from incision site. Painful on palpation. Packing removed   Disposition: home  Patient Instructions:  Medication List  As of 01/22/2012 11:44 AM   STOP taking these medications         HYDROcodone-acetaminophen 5-500 MG per tablet      sulfamethoxazole-trimethoprim 800-160 MG per tablet    Start taking These Medications      Clindamycin 300mg  QID     TAKE these medications         acidophilus Caps   Take 1 capsule by mouth daily.      albuterol 108 (90 BASE) MCG/ACT inhaler   Commonly known as: PROVENTIL HFA;VENTOLIN HFA   Inhale 1 puff into the lungs every 6 (six) hours as needed. For shortness of breath, chronic bronchitis      amLODipine 10 MG tablet   Commonly known as: NORVASC   Take 10 mg by mouth daily.      aspirin 81 MG tablet   Take 81 mg by mouth daily.      ferrous sulfate 325 (65 FE) MG tablet   Take 325 mg by mouth daily with breakfast.  fexofenadine 180 MG tablet   Commonly known as: ALLEGRA   Take 180 mg by mouth daily.      FLUoxetine 40 MG capsule   Commonly known as: PROZAC   Take 1 capsule (40 mg total) by mouth daily.      fluticasone 27.5 MCG/SPRAY nasal spray   Commonly known as:  VERAMYST   Place 2 sprays into the nose daily.      GAS-X PO   Take 1 tablet by mouth daily as needed. For gas        ipratropium 0.06 % nasal spray   Commonly known as: ATROVENT   Place 2 sprays into the nose 4 (four) times daily.      metoprolol succinate 100 MG 24 hr tablet   Commonly known as: TOPROL-XL   Take 100 mg by mouth daily.      nitroGLYCERIN 0.4 MG SL tablet   Commonly known as: NITROSTAT   Place 0.4 mg under the tongue every 5 (five) minutes as needed.      rosuvastatin 20 MG tablet   Commonly known as: CRESTOR   Take 20 mg by mouth daily.      valsartan-hydrochlorothiazide 320-25 MG per tablet   Commonly known as: DIOVAN-HCT   Take 1 tablet by mouth daily.      vitamin B-12 1000 MCG tablet   Commonly known as: CYANOCOBALAMIN   Take 1,000 mcg by mouth daily.      Vitamin D3 1000 UNITS Caps   Take by mouth daily.            Activity: activity as tolerated Diet: regular diet Wound Care: Packing removed. Bandages as appropriate. May wash area  Follow-up with [facility]  Follow-up Items: 1. MRSA cellulitis/Abscess 2. Anemia  Signed: Shelly Flatten, MD Family Medicine Resident PGY-1 [phone] 01/22/2012 11:44 AM

## 2012-01-09 NOTE — Progress Notes (Signed)
1700 Discharge instructions given to patient and husband, including discharge medications, signs of infection and follow up care.  Verbalizes understanding with no further questions.  Discharged to home, VSS.

## 2012-01-10 ENCOUNTER — Encounter: Payer: Self-pay | Admitting: Family Medicine

## 2012-01-10 ENCOUNTER — Telehealth: Payer: Self-pay | Admitting: Internal Medicine

## 2012-01-10 NOTE — Telephone Encounter (Signed)
Patient is calling back with the fax # that the letter for her to return to work needs to be sent to.  [phone]

## 2012-01-10 NOTE — Telephone Encounter (Signed)
Need note to return to work.  Patient was seen on our service and was d/c'd by you.  Need to have this today.

## 2012-01-22 NOTE — Discharge Summary (Signed)
I interviewed and examined this patient and discussed the care plan with Dr. Konrad Dolores and the [facility] team and agree with assessment and plan as documented in the discharge note for today.    Kaileia Flow A. Sheffield Slider, MD Family Medicine Teaching Service Attending  01/22/2012 1:48 PM

## 2012-01-24 ENCOUNTER — Ambulatory Visit (INDEPENDENT_AMBULATORY_CARE_PROVIDER_SITE_OTHER): Payer: No Typology Code available for payment source | Admitting: Emergency Medicine

## 2012-01-24 VITALS — BP 128/62 | HR 88 | Temp 98.3°F | Resp 18 | Wt 157.0 lb

## 2012-01-24 DIAGNOSIS — J029 Acute pharyngitis, unspecified: Secondary | ICD-10-CM

## 2012-01-24 DIAGNOSIS — J40 Bronchitis, not specified as acute or chronic: Secondary | ICD-10-CM

## 2012-01-24 MED ORDER — AZITHROMYCIN 250 MG PO TABS: ORAL_TABLET | ORAL | Status: AC

## 2012-01-24 MED ORDER — HYDROCOD POLST-CHLORPHEN POLST 10-8 MG/5ML PO LQCR: 5.0000 mL | Freq: Two times a day (BID) | ORAL | Status: DC | PRN

## 2012-01-24 NOTE — Progress Notes (Signed)
Date:  01/24/2012   Name:  Destiny Ballard   DOB:  1951-01-20   MRN:  [id number]  PCP:  Tonye Pearson, MD    Chief Complaint: Sore Throat and Back Pain   History of Present Illness:  Destiny Ballard is a 61 y.o. very pleasant female patient who presents with the following:  Tuesday developed sore throat and cough.  No nasal drainage.  No congestion. Cough is non productive.  No fever or chills.  No nausea or vomiting  Patient Active Problem List  Diagnosis  . HYPERLIPIDEMIA  . MORBID OBESITY  . ANEMIA-IRON DEFICIENCY  . HYPERTENSION  . CORONARY ARTERY DISEASE  . GERD  . HEADACHE, CHRONIC  . COUGH  . Chest pain  . Abscess of axilla  . Cellulitis of axilla, left   Past Medical History  Diagnosis Date  . CAD (coronary artery disease)     a) s/p DES to LAD 07/2005 b) Last Myoview low risk 11/2011 showing small fixed apical perfusion defect (prior MI vs attenuation) but no ischemia - normal EF.  [name] Hypertension   . Hyperlipidemia   . Obesity   . Anxiety     Prior suicide attempt  . Depression   . Cervical spondylosis   . Insulin resistance   . Iron deficiency anemia   . GERD (gastroesophageal reflux disease)    Past Surgical History  Procedure Date  . Cardiac catheterization 06/17/2007    NORMAL. EF 60%  . Coronary stent placement 07/2005    LEFT ANTERIOR DESCENDING  . Tubal ligation   . Tonsillectomy and adenoidectomy   . Childbirth     X3  . Breast enhancement surgery   . Cervical spondylosis     SINGLE LEVEL FUSION  . Incision and drainage breast abscess 01/05/2012       . Forearm fracture surgery 2010    hand and shoulder    History  Substance Use Topics  . Smoking status: Never Smoker   . Smokeless tobacco: Never Used  . Alcohol Use: 0.6 oz/week    1 Glasses of wine per week     social   Family History  Problem Relation Age of Onset  . Heart attack Mother   . Diabetes Mother   . Cancer Mother   . Suicidality Father    Allergies    Allergen Reactions  . Prednisone     REACTION: mood swings, nightmares. "Shot doesn't bother me, reaction is just with the pill"    Medication list has been reviewed and updated.  Current Outpatient Prescriptions on File Prior to Visit  Medication Sig Dispense Refill  . acidophilus (RISAQUAD) CAPS Take 1 capsule by mouth daily.  7 capsule  0  . albuterol (PROVENTIL HFA;VENTOLIN HFA) 108 (90 BASE) MCG/ACT inhaler Inhale 1 puff into the lungs every 6 (six) hours as needed. For shortness of breath, chronic bronchitis      . amLODipine (NORVASC) 10 MG tablet Take 10 mg by mouth daily.      [name] aspirin 81 MG tablet Take 81 mg by mouth daily.        . Cholecalciferol (VITAMIN D3) 1000 UNITS CAPS Take by mouth daily.        . ferrous sulfate 325 (65 FE) MG tablet Take 325 mg by mouth daily with breakfast.      . fexofenadine (ALLEGRA) 180 MG tablet Take 180 mg by mouth daily.        [name] FLUoxetine (PROZAC) 40 MG  capsule Take 1 capsule (40 mg total) by mouth daily.  90 capsule  2  . fluticasone (VERAMYST) 27.5 MCG/SPRAY nasal spray Place 2 sprays into the nose daily.      [name] ipratropium (ATROVENT) 0.06 % nasal spray Place 2 sprays into the nose 4 (four) times daily.  15 mL  2  . metoprolol succinate (TOPROL-XL) 100 MG 24 hr tablet Take 100 mg by mouth daily.      . nitroGLYCERIN (NITROSTAT) 0.4 MG SL tablet Place 0.4 mg under the tongue every 5 (five) minutes as needed.      . rosuvastatin (CRESTOR) 20 MG tablet Take 20 mg by mouth daily.      . Simethicone (GAS-X PO) Take 1 tablet by mouth daily as needed. For gas       . valsartan-hydrochlorothiazide (DIOVAN-HCT) 320-25 MG per tablet Take 1 tablet by mouth daily.      . vitamin B-12 (CYANOCOBALAMIN) 1000 MCG tablet Take 1,000 mcg by mouth daily.         Review of Systems:  As per HPI, otherwise negative.    Physical Examination: Filed Vitals:   01/24/12 1012  BP: 128/62  Pulse: 88  Temp: 98.3 F (36.8 C)  Resp: 18   Filed Vitals:    01/24/12 1012  Weight: 157 lb (71.215 kg)   There is no height on file to calculate BMI. Ideal Body Weight:    GEN: WDWN, NAD, Non-toxic, A & O x 3 HEENT: Atraumatic, Normocephalic. Neck supple. No masses, No LAD. Ears and Nose: No external deformity. CV: RRR, No M/G/R. No JVD. No thrill. No extra heart sounds. PULM: CTA B, no wheezes, crackles, rhonchi. No retractions. No resp. distress. No accessory muscle use. ABD: S, NT, ND, +BS. No rebound. No HSM. EXTR: No c/c/e NEURO Normal gait.  PSYCH: Normally interactive. Conversant. Not depressed or anxious appearing.  Calm demeanor.   EKG / Labs / Xrays: None available at time of encounter  Assessment and Plan: Bronchitis and pharyngitis   Carmelina Dane, MD

## 2012-02-04 ENCOUNTER — Ambulatory Visit (INDEPENDENT_AMBULATORY_CARE_PROVIDER_SITE_OTHER): Payer: No Typology Code available for payment source | Admitting: Emergency Medicine

## 2012-02-04 VITALS — BP 146/72 | HR 90 | Temp 98.2°F | Resp 18 | Ht 60.0 in | Wt 158.4 lb

## 2012-02-04 DIAGNOSIS — J40 Bronchitis, not specified as acute or chronic: Secondary | ICD-10-CM

## 2012-02-04 DIAGNOSIS — J018 Other acute sinusitis: Secondary | ICD-10-CM

## 2012-02-04 MED ORDER — AMOXICILLIN-POT CLAVULANATE 875-125 MG PO TABS: 1.0000 | ORAL_TABLET | Freq: Two times a day (BID) | ORAL | Status: DC

## 2012-02-04 MED ORDER — PSEUDOEPHEDRINE-GUAIFENESIN ER 60-600 MG PO TB12: 1.0000 | ORAL_TABLET | Freq: Two times a day (BID) | ORAL | Status: DC

## 2012-02-04 MED ORDER — HYDROCOD POLST-CHLORPHEN POLST 10-8 MG/5ML PO LQCR: 5.0000 mL | Freq: Two times a day (BID) | ORAL | Status: DC | PRN

## 2012-02-04 NOTE — Progress Notes (Signed)
  Subjective:    Patient ID: Destiny Ballard, female    DOB: 1950-12-23, 61 y.o.   MRN: [id number]  Cough This is a recurrent problem. The current episode started in the past 7 days. The problem has been unchanged. The problem occurs constantly. The cough is non-productive. Associated symptoms include ear congestion, nasal congestion, postnasal drip, rhinorrhea and a sore throat. Pertinent negatives include no chest pain, chills, ear pain, fever, headaches, heartburn, hemoptysis, myalgias, rash, shortness of breath, sweats, weight loss or wheezing. The symptoms are aggravated by lying down. The treatment provided no relief. There is no history of asthma, bronchiectasis, bronchitis, COPD, emphysema, environmental allergies or pneumonia.      Review of Systems  Constitutional: Negative for fever, chills and weight loss.  HENT: Positive for sore throat, rhinorrhea and postnasal drip. Negative for ear pain.   Eyes: Negative.   Respiratory: Positive for cough. Negative for hemoptysis, shortness of breath and wheezing.   Cardiovascular: Negative for chest pain.  Gastrointestinal: Negative.  Negative for heartburn.  Genitourinary: Negative.   Musculoskeletal: Negative.  Negative for myalgias.  Skin: Negative for rash.  Neurological: Negative.  Negative for headaches.  Hematological: Negative for environmental allergies.       Objective:   Physical Exam  Constitutional: She is oriented to person, place, and time. She appears well-developed and well-nourished.  HENT:  Head: Normocephalic.  Nose: Mucosal edema and rhinorrhea present. Right sinus exhibits maxillary sinus tenderness. Left sinus exhibits maxillary sinus tenderness.  Eyes: Conjunctivae are normal. Pupils are equal, round, and reactive to light.  Neck: Normal range of motion. Neck supple.  Cardiovascular: Normal rate and regular rhythm.   Pulmonary/Chest: Effort normal and breath sounds normal.  Abdominal: Soft.    Musculoskeletal: Normal range of motion.  Neurological: She is alert and oriented to person, place, and time.  Skin: Skin is warm and dry.          Assessment & Plan:  Sinusitis Bronchitis augmentin mucinex tussionex  Follow up as needed

## 2012-02-24 ENCOUNTER — Ambulatory Visit (INDEPENDENT_AMBULATORY_CARE_PROVIDER_SITE_OTHER): Payer: No Typology Code available for payment source | Admitting: Emergency Medicine

## 2012-02-24 ENCOUNTER — Ambulatory Visit: Payer: No Typology Code available for payment source

## 2012-02-24 VITALS — BP 130/58 | HR 78 | Temp 98.4°F | Resp 16 | Ht 60.0 in | Wt 156.0 lb

## 2012-02-24 DIAGNOSIS — J449 Chronic obstructive pulmonary disease, unspecified: Secondary | ICD-10-CM

## 2012-02-24 DIAGNOSIS — J019 Acute sinusitis, unspecified: Secondary | ICD-10-CM

## 2012-02-24 DIAGNOSIS — R05 Cough: Secondary | ICD-10-CM

## 2012-02-24 DIAGNOSIS — J209 Acute bronchitis, unspecified: Secondary | ICD-10-CM

## 2012-02-24 DIAGNOSIS — R059 Cough, unspecified: Secondary | ICD-10-CM

## 2012-02-24 MED ORDER — FLUTICASONE-SALMETEROL 115-21 MCG/ACT IN AERO: 2.0000 | INHALATION_SPRAY | Freq: Two times a day (BID) | RESPIRATORY_TRACT | Status: DC

## 2012-02-24 NOTE — Progress Notes (Signed)
Date:  02/24/2012   Name:  Destiny Ballard   DOB:  1951-04-21   MRN:  [id number] Gender: female  Age: 61 y.o.  PCP:  Tonye Pearson, MD    Chief Complaint: Cough   History of Present Illness:  Destiny Ballard is a 61 y.o. pleasant patient who presents with the following:  Nasal congestion and post nasal drainage mostly mucoid.  Has sore throat.  Persistent non productive cough.  No fever or chills or shortness of breath.  Ill since Friday again.  This is her third visit for similar symptoms in 6 weeks.  Some wheezing not compliant with MDI or nasal steroid   Patient Active Problem List  Diagnosis  . HYPERLIPIDEMIA  . MORBID OBESITY  . ANEMIA-IRON DEFICIENCY  . HYPERTENSION  . CORONARY ARTERY DISEASE  . GERD  . HEADACHE, CHRONIC  . COUGH  . Chest pain  . Abscess of axilla  . Cellulitis of axilla, left    Past Medical History  Diagnosis Date  . CAD (coronary artery disease)     a) s/p DES to LAD 07/2005 b) Last Myoview low risk 11/2011 showing small fixed apical perfusion defect (prior MI vs attenuation) but no ischemia - normal EF.  [name] Hypertension   . Hyperlipidemia   . Obesity   . Anxiety     Prior suicide attempt  . Depression   . Cervical spondylosis   . Insulin resistance   . Iron deficiency anemia   . GERD (gastroesophageal reflux disease)     Past Surgical History  Procedure Date  . Cardiac catheterization 06/17/2007    NORMAL. EF 60%  . Coronary stent placement 07/2005    LEFT ANTERIOR DESCENDING  . Tubal ligation   . Tonsillectomy and adenoidectomy   . Childbirth     X3  . Breast enhancement surgery   . Cervical spondylosis     SINGLE LEVEL FUSION  . Incision and drainage breast abscess 01/05/2012       . Forearm fracture surgery 2010    hand and shoulder     History  Substance Use Topics  . Smoking status: Never Smoker   . Smokeless tobacco: Never Used  . Alcohol Use: 0.6 oz/week    1 Glasses of wine per week     social     Family History  Problem Relation Age of Onset  . Heart attack Mother   . Diabetes Mother   . Cancer Mother   . Suicidality Father     Allergies  Allergen Reactions  . Prednisone     REACTION: mood swings, nightmares. "Shot doesn't bother me, reaction is just with the pill"    Medication list has been reviewed and updated.  Current Outpatient Prescriptions on File Prior to Visit  Medication Sig Dispense Refill  . albuterol (PROVENTIL HFA;VENTOLIN HFA) 108 (90 BASE) MCG/ACT inhaler Inhale 1 puff into the lungs every 6 (six) hours as needed. For shortness of breath, chronic bronchitis      . amLODipine (NORVASC) 10 MG tablet Take 10 mg by mouth daily.      [name] aspirin 81 MG tablet Take 81 mg by mouth daily.        . Cholecalciferol (VITAMIN D3) 1000 UNITS CAPS Take by mouth daily.        . fexofenadine (ALLEGRA) 180 MG tablet Take 180 mg by mouth daily.        [name] FLUoxetine (PROZAC) 40 MG capsule Take 1 capsule (40 mg  total) by mouth daily.  90 capsule  2  . fluticasone (VERAMYST) 27.5 MCG/SPRAY nasal spray Place 2 sprays into the nose daily.      [name] ipratropium (ATROVENT) 0.06 % nasal spray Place 2 sprays into the nose 4 (four) times daily.  15 mL  2  . metoprolol succinate (TOPROL-XL) 100 MG 24 hr tablet Take 100 mg by mouth daily.      . nitroGLYCERIN (NITROSTAT) 0.4 MG SL tablet Place 0.4 mg under the tongue every 5 (five) minutes as needed.      . pseudoephedrine-guaifenesin (MUCINEX D) 60-600 MG per tablet Take 1 tablet by mouth every 12 (twelve) hours.  18 tablet  0  . rosuvastatin (CRESTOR) 20 MG tablet Take 20 mg by mouth daily.      . valsartan-hydrochlorothiazide (DIOVAN-HCT) 320-25 MG per tablet Take 1 tablet by mouth daily.      [name] acidophilus (RISAQUAD) CAPS Take 1 capsule by mouth daily.  7 capsule  0  . chlorpheniramine-HYDROcodone (TUSSIONEX PENNKINETIC ER) 10-8 MG/5ML LQCR Take 5 mLs by mouth every 12 (twelve) hours as needed (cough).  30 mL  0  . ferrous sulfate 325  (65 FE) MG tablet Take 325 mg by mouth daily with breakfast.      . Simethicone (GAS-X PO) Take 1 tablet by mouth daily as needed. For gas       . vitamin B-12 (CYANOCOBALAMIN) 1000 MCG tablet Take 1,000 mcg by mouth daily.         Review of Systems:  As per HPI, otherwise negative.    Physical Examination: Filed Vitals:   02/24/12 1007  BP: 130/58  Pulse: 78  Temp: 98.4 F (36.9 C)  Resp: 16   Filed Vitals:   02/24/12 1007  Height: 5' (1.524 m)  Weight: 156 lb (70.761 kg)   Body mass index is 30.47 kg/(m^2). Ideal Body Weight: Weight in (lb) to have BMI = 25: 127.7   GEN: WDWN, NAD, Non-toxic, A & O x 3 HEENT: Atraumatic, Normocephalic. Neck supple. No masses, No LAD. Ears and Nose: No external deformity. CV: RRR, No M/G/R. No JVD. No thrill. No extra heart sounds. PULM: CTA B, no wheezes, crackles, rhonchi. No retractions. No resp. distress. No accessory muscle use. ABD: S, NT, ND, +BS. No rebound. No HSM. EXTR: No c/c/e NEURO Normal gait.  PSYCH: Normally interactive. Conversant. Not depressed or anxious appearing.  Calm demeanor.    Assessment and Plan: Emphysema Chronic bronchitis Non compliance with inhalers Follow up as needed  Carmelina Dane, MD   [facility] reading (PRIMARY) by  Dr. Dareen Piano.  Breast implant.  Emphysema.  No acute process.

## 2012-02-28 ENCOUNTER — Other Ambulatory Visit: Payer: Self-pay | Admitting: Cardiology

## 2012-02-28 MED ORDER — AMLODIPINE BESYLATE 10 MG PO TABS: 10.0000 mg | ORAL_TABLET | Freq: Every day | ORAL | Status: DC

## 2012-03-23 ENCOUNTER — Ambulatory Visit (INDEPENDENT_AMBULATORY_CARE_PROVIDER_SITE_OTHER): Payer: No Typology Code available for payment source | Admitting: Emergency Medicine

## 2012-03-23 VITALS — BP 124/60 | HR 83 | Temp 98.3°F | Resp 18 | Ht 59.75 in | Wt 157.8 lb

## 2012-03-23 DIAGNOSIS — R918 Other nonspecific abnormal finding of lung field: Secondary | ICD-10-CM

## 2012-03-23 DIAGNOSIS — E7800 Pure hypercholesterolemia, unspecified: Secondary | ICD-10-CM

## 2012-03-23 DIAGNOSIS — J40 Bronchitis, not specified as acute or chronic: Secondary | ICD-10-CM

## 2012-03-23 DIAGNOSIS — J018 Other acute sinusitis: Secondary | ICD-10-CM

## 2012-03-23 DIAGNOSIS — J029 Acute pharyngitis, unspecified: Secondary | ICD-10-CM

## 2012-03-23 DIAGNOSIS — R059 Cough, unspecified: Secondary | ICD-10-CM

## 2012-03-23 DIAGNOSIS — E785 Hyperlipidemia, unspecified: Secondary | ICD-10-CM

## 2012-03-23 DIAGNOSIS — J45909 Unspecified asthma, uncomplicated: Secondary | ICD-10-CM

## 2012-03-23 DIAGNOSIS — R05 Cough: Secondary | ICD-10-CM

## 2012-03-23 LAB — POCT CBC
Granulocyte percent: 74.8 %G (ref 37–80)
HCT, POC: 39.6 % (ref 37.7–47.9)
Hemoglobin: 11.9 g/dL — AB (ref 12.2–16.2)
Lymph, poc: 1.9 (ref 0.6–3.4)
MCH, POC: 24.8 pg — AB (ref 27–31.2)
MCHC: 30.1 g/dL — AB (ref 31.8–35.4)
MCV: 82.6 fL (ref 80–97)
MID (cbc): 0.7 (ref 0–0.9)
MPV: 8.6 fL (ref 0–99.8)
POC Granulocyte: 7.9 — AB (ref 2–6.9)
POC LYMPH PERCENT: 18.5 %L (ref 10–50)
POC MID %: 6.7 %M (ref 0–12)
Platelet Count, POC: 265 10*3/uL (ref 142–424)
RBC: 4.79 M/uL (ref 4.04–5.48)
RDW, POC: 13.8 %
WBC: 10.5 10*3/uL — AB (ref 4.6–10.2)

## 2012-03-23 LAB — POCT RAPID STREP A (OFFICE): Rapid Strep A Screen: NEGATIVE

## 2012-03-23 MED ORDER — METHYLPREDNISOLONE ACETATE 80 MG/ML IJ SUSP
80.0000 mg | Freq: Once | INTRAMUSCULAR | Status: AC
  Administered 2012-03-23: 80 mg via INTRAMUSCULAR

## 2012-03-23 MED ORDER — ROSUVASTATIN CALCIUM 20 MG PO TABS: 20.0000 mg | ORAL_TABLET | Freq: Every day | ORAL | Status: DC

## 2012-03-23 MED ORDER — ALBUTEROL SULFATE (2.5 MG/3ML) 0.083% IN NEBU
2.5000 mg | INHALATION_SOLUTION | Freq: Once | RESPIRATORY_TRACT | Status: AC
  Administered 2012-03-23: 2.5 mg via RESPIRATORY_TRACT

## 2012-03-23 MED ORDER — ALBUTEROL SULFATE HFA 108 (90 BASE) MCG/ACT IN AERS: 1.0000 | INHALATION_SPRAY | Freq: Four times a day (QID) | RESPIRATORY_TRACT | Status: DC | PRN

## 2012-03-23 MED ORDER — HYDROCOD POLST-CHLORPHEN POLST 10-8 MG/5ML PO LQCR: 5.0000 mL | Freq: Two times a day (BID) | ORAL | Status: DC | PRN

## 2012-03-23 NOTE — Patient Instructions (Addendum)

## 2012-03-23 NOTE — Progress Notes (Signed)
  Subjective:    Patient ID: Destiny Ballard, female    DOB: Sep 18, 1950, 61 y.o.   MRN: [id number]  HPIsore throat, cough, does not smoke but lives with smoker. Uses inhaler regularly. Coughs up yellow mucus. Was sick last month and thinks she had CXR then. Allergies suspected. Has house full of dogs. The patient had chest x-ray done 1 month ago and it was read as COPD otherwise negative the she lives in a home where her husband smokes and had multiple dogs. She is not a smoker. She has persistent cough despite using Advair. She states mucus she produces yellowish. She is also requesting a refill of [place] for but does not know when she last had her cholesterol checked  Review of Systems     Objective:   Physical Exam HEENT exam reveals turbinates to be blue and swollen. TMs are normal. Throat is slightly erythematous. The neck is supple. Chest exam revealed diminished sounds in the bases with prolongation of expiration but no wheezes and no rales were audible.  Results for orders placed in visit on 03/23/12  POCT RAPID STREP A (OFFICE)      Component Value Range   Rapid Strep A Screen Negative  Negative  POCT CBC      Component Value Range   WBC 10.5 (*) 4.6 - 10.2 K/uL   Lymph, poc 1.9  0.6 - 3.4   POC LYMPH PERCENT 18.5  10 - 50 %L   MID (cbc) 0.7  0 - 0.9   POC MID % 6.7  0 - 12 %M   POC Granulocyte 7.9 (*) 2 - 6.9   Granulocyte percent 74.8  37 - 80 %G   RBC 4.79  4.04 - 5.48 M/uL   Hemoglobin 11.9 (*) 12.2 - 16.2 g/dL   HCT, POC 78.2  95.6 - 47.9 %   MCV 82.6  80 - 97 fL   MCH, POC 24.8 (*) 27 - 31.2 pg   MCHC 30.1 (*) 31.8 - 35.4 g/dL   RDW, POC 21.3     Platelet Count, POC 265  142 - 424 K/uL   MPV 8.6  0 - 99.8 fL        Assessment & Plan:  Patient has a combination of 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 She is to

## 2012-03-24 LAB — COMPREHENSIVE METABOLIC PANEL
ALT: 34 U/L (ref 0–35)
AST: 33 U/L (ref 0–37)
Albumin: 4.5 g/dL (ref 3.5–5.2)
Alkaline Phosphatase: 69 U/L (ref 39–117)
BUN: 14 mg/dL (ref 6–23)
CO2: 33 mEq/L — ABNORMAL HIGH (ref 19–32)
Calcium: 9.7 mg/dL (ref 8.4–10.5)
Chloride: 98 mEq/L (ref 96–112)
Creat: 0.59 mg/dL (ref 0.50–1.10)
Glucose, Bld: 92 mg/dL (ref 70–99)
Potassium: 3.8 mEq/L (ref 3.5–5.3)
Sodium: 139 mEq/L (ref 135–145)
Total Bilirubin: 0.5 mg/dL (ref 0.3–1.2)
Total Protein: 7.6 g/dL (ref 6.0–8.3)

## 2012-03-24 LAB — LIPID PANEL
Cholesterol: 179 mg/dL (ref 0–200)
HDL: 52 mg/dL (ref >39)
LDL Cholesterol: 70 mg/dL (ref 0–99)
Total CHOL/HDL Ratio: 3.4 Ratio
Triglycerides: 284 mg/dL — ABNORMAL HIGH (ref <150)
VLDL: 57 mg/dL — ABNORMAL HIGH (ref 0–40)

## 2012-04-23 ENCOUNTER — Ambulatory Visit (INDEPENDENT_AMBULATORY_CARE_PROVIDER_SITE_OTHER): Payer: No Typology Code available for payment source | Admitting: Family Medicine

## 2012-04-23 VITALS — BP 118/98 | HR 76 | Temp 99.4°F | Resp 16 | Ht 59.0 in | Wt 159.0 lb

## 2012-04-23 DIAGNOSIS — R252 Cramp and spasm: Secondary | ICD-10-CM

## 2012-04-23 DIAGNOSIS — M79606 Pain in leg, unspecified: Secondary | ICD-10-CM

## 2012-04-23 DIAGNOSIS — D649 Anemia, unspecified: Secondary | ICD-10-CM

## 2012-04-23 DIAGNOSIS — M79609 Pain in unspecified limb: Secondary | ICD-10-CM

## 2012-04-23 LAB — POCT CBC
Granulocyte percent: 69.8 %G (ref 37–80)
HCT, POC: 39 % (ref 37.7–47.9)
Hemoglobin: 11.7 g/dL — AB (ref 12.2–16.2)
Lymph, poc: 2.4 (ref 0.6–3.4)
MCH, POC: 25.3 pg — AB (ref 27–31.2)
MCHC: 30 g/dL — AB (ref 31.8–35.4)
MCV: 84.2 fL (ref 80–97)
MID (cbc): 0.7 (ref 0–0.9)
MPV: 8.3 fL (ref 0–99.8)
POC Granulocyte: 7 — AB (ref 2–6.9)
POC MID %: 6.6 %M (ref 0–12)
Platelet Count, POC: 276 10*3/uL (ref 142–424)
RBC: 4.63 M/uL (ref 4.04–5.48)
RDW, POC: 13.9 %
WBC: 10 10*3/uL (ref 4.6–10.2)

## 2012-04-23 NOTE — Progress Notes (Signed)
[facility]:  Office Visit  Chief Complaint:  Chief Complaint  Patient presents with  . Leg Pain    HPI: Destiny Ballard is a 61 y.o. female who complains of  Right leg cramps which has been chronic this particular episode has been going on for 2-3 months. Usually walks and rubs it out but this past Tuesday had been worse.  No pain with walking.   Past Medical History  Diagnosis Date  . CAD (coronary artery disease)     a) s/p DES to LAD 07/2005 b) Last Myoview low risk 11/2011 showing small fixed apical perfusion defect (prior MI vs attenuation) but no ischemia - normal EF.  [name] Hypertension   . Hyperlipidemia   . Obesity   . Anxiety     Prior suicide attempt  . Depression   . Cervical spondylosis   . Insulin resistance   . Iron deficiency anemia   . GERD (gastroesophageal reflux disease)    Past Surgical History  Procedure Date  . Cardiac catheterization 06/17/2007    NORMAL. EF 60%  . Coronary stent placement 07/2005    LEFT ANTERIOR DESCENDING  . Tubal ligation   . Tonsillectomy and adenoidectomy   . Childbirth     X3  . Breast enhancement surgery   . Cervical spondylosis     SINGLE LEVEL FUSION  . Incision and drainage breast abscess 01/05/2012       . Forearm fracture surgery 2010    hand and shoulder    History   Social History  . Marital Status: Married    Spouse Name: N/A    Number of Children: N/A  . Years of Education: N/A   Social History Main Topics  . Smoking status: Never Smoker   . Smokeless tobacco: Never Used  . Alcohol Use: 0.6 oz/week    1 Glasses of wine per week     social  . Drug Use: No  . Sexually Active: Yes   Other Topics Concern  . None   Social History Narrative  . None   Family History  Problem Relation Age of Onset  . Heart attack Mother   . Diabetes Mother   . Cancer Mother   . Suicidality Father    Allergies  Allergen Reactions  . Prednisone     REACTION: mood swings, nightmares. "Shot  doesn't bother me, reaction is just with the pill"   Prior to Admission medications   Medication Sig Start Date End Date Taking? Authorizing Provider  amLODipine (NORVASC) 10 MG tablet Take 1 tablet (10 mg total) by mouth daily. 02/28/12 02/27/13 Yes Laurey Morale, MD  aspirin 81 MG tablet Take 81 mg by mouth daily.     Yes Historical Provider, MD  Cholecalciferol (VITAMIN D3) 1000 UNITS CAPS Take by mouth daily.     Yes Historical Provider, MD  fexofenadine (ALLEGRA) 180 MG tablet Take 180 mg by mouth daily.     Yes Historical Provider, MD  FLUoxetine (PROZAC) 40 MG capsule Take 1 capsule (40 mg total) by mouth daily. 11/16/11  Yes Dois Davenport, MD  metoprolol succinate (TOPROL-XL) 100 MG 24 hr tablet Take 100 mg by mouth daily. 12/23/11  Yes Laurey Morale, MD  rosuvastatin (CRESTOR) 20 MG tablet Take 1 tablet (20 mg total) by mouth daily. 03/23/12 03/23/13 Yes Collene Gobble, MD  valsartan-hydrochlorothiazide (DIOVAN-HCT) 320-25 MG per tablet Take 1 tablet by mouth daily. 12/20/11 12/19/12 Yes Laurey Morale, MD  acidophilus (RISAQUAD) CAPS Take 1 capsule by mouth daily. 01/09/12   Ozella Rocks, MD  albuterol (PROVENTIL HFA;VENTOLIN HFA) 108 (90 BASE) MCG/ACT inhaler Inhale 1 puff into the lungs every 6 (six) hours as needed. For shortness of breath, chronic bronchitis 03/23/12 03/23/13  Collene Gobble, MD  chlorpheniramine-HYDROcodone [facility] PENNKINETIC ER) 10-8 MG/5ML LQCR Take 5 mLs by mouth every 12 (twelve) hours as needed (cough). 03/23/12   Collene Gobble, MD  ferrous sulfate 325 (65 FE) MG tablet Take 325 mg by mouth daily with breakfast.    Historical Provider, MD  fluticasone (VERAMYST) 27.5 MCG/SPRAY nasal spray Place 2 sprays into the nose daily.    Historical Provider, MD  fluticasone-salmeterol (ADVAIR HFA) 115-21 MCG/ACT inhaler Inhale 2 puffs into the lungs 2 (two) times daily. 02/24/12 02/23/13  Guard Odor, MD  ipratropium (ATROVENT) 0.06 % nasal spray Place 2 sprays into the nose 4  (four) times daily. 10/07/11 10/06/12  Morrell Riddle, PA-C  nitroGLYCERIN (NITROSTAT) 0.4 MG SL tablet Place 0.4 mg under the tongue every 5 (five) minutes as needed. 12/11/11 12/10/12  Dayna N Dunn, PA  pseudoephedrine-guaifenesin (MUCINEX D) 60-600 MG per tablet Take 1 tablet by mouth every 12 (twelve) hours. 02/04/12 02/03/13  Dauria Odor, MD  Simethicone (GAS-X PO) Take 1 tablet by mouth daily as needed. For gas     Historical Provider, MD  vitamin B-12 (CYANOCOBALAMIN) 1000 MCG tablet Take 1,000 mcg by mouth daily.     Historical Provider, MD     ROS: The patient denies fevers, chills, night sweats, unintentional weight loss, chest pain, palpitations, wheezing, dyspnea on exertion, nausea, vomiting, abdominal pain, dysuria, hematuria, melena, numbness, weakness, or tingling.   All other systems have been reviewed and were otherwise negative with the exception of those mentioned in the HPI and as above.    PHYSICAL EXAM: Filed Vitals:   04/23/12 1741  BP: 118/98  Pulse: 76  Temp: 99.4 F (37.4 C)  Resp: 16   Filed Vitals:   04/23/12 1741  Height: 4\' 11"  (1.499 m)  Weight: 159 lb (72.122 kg)   Body mass index is 32.11 kg/(m^2).  General: Alert, no acute distress HEENT:  Normocephalic, atraumatic, oropharynx patent.  Cardiovascular:  Regular rate and rhythm, no rubs murmurs or gallops.  No Carotid bruits, radial pulse intact. No pedal edema.  Respiratory: Clear to auscultation bilaterally.  No wheezes, rales, or rhonchi.  No cyanosis, no use of accessory musculature GI: No organomegaly, abdomen is soft and non-tender, positive bowel sounds.  No masses. Skin: No rashes. Neurologic: Facial musculature symmetric. Psychiatric: Patient is appropriate throughout our interaction. Lymphatic: No cervical lymphadenopathy Musculoskeletal: Gait intact. Normal legs no edema ROM intact, sesnation intact + DP   LABS: Results for orders placed in visit on 03/23/12  COMPREHENSIVE  METABOLIC PANEL      Component Value Range   Sodium 139  135 - 145 mEq/L   Potassium 3.8  3.5 - 5.3 mEq/L   Chloride 98  96 - 112 mEq/L   CO2 33 (*) 19 - 32 mEq/L   Glucose, Bld 92  70 - 99 mg/dL   BUN 14  6 - 23 mg/dL   Creat 1.61  0.96 - 0.45 mg/dL   Total Bilirubin 0.5  0.3 - 1.2 mg/dL   Alkaline Phosphatase 69  39 - 117 U/L   AST 33  0 - 37 U/L   ALT 34  0 - 35 U/L   Total Protein 7.6  6.0 - 8.3 g/dL   Albumin 4.5  3.5 - 5.2 g/dL   Calcium 9.7  8.4 - 16.1 mg/dL  LIPID PANEL      Component Value Range   Cholesterol 179  0 - 200 mg/dL   Triglycerides 096 (*) <150 mg/dL   HDL 52  >04 mg/dL   Total CHOL/HDL Ratio 3.4     VLDL 57 (*) 0 - 40 mg/dL   LDL Cholesterol 70  0 - 99 mg/dL  POCT RAPID STREP A (OFFICE)      Component Value Range   Rapid Strep A Screen Negative  Negative  POCT CBC      Component Value Range   WBC 10.5 (*) 4.6 - 10.2 K/uL   Lymph, poc 1.9  0.6 - 3.4   POC LYMPH PERCENT 18.5  10 - 50 %L   MID (cbc) 0.7  0 - 0.9   POC MID % 6.7  0 - 12 %M   POC Granulocyte 7.9 (*) 2 - 6.9   Granulocyte percent 74.8  37 - 80 %G   RBC 4.79  4.04 - 5.48 M/uL   Hemoglobin 11.9 (*) 12.2 - 16.2 g/dL   HCT, POC 54.0  98.1 - 47.9 %   MCV 82.6  80 - 97 fL   MCH, POC 24.8 (*) 27 - 31.2 pg   MCHC 30.1 (*) 31.8 - 35.4 g/dL   RDW, POC 19.1     Platelet Count, POC 265  142 - 424 K/uL   MPV 8.6  0 - 99.8 fL     EKG/XRAY:   Primary read interpreted by Dr. Conley Rolls at [facility].   ASSESSMENT/PLAN: Encounter Diagnoses  Name Primary?  . Leg cramps Yes  . Leg pain   . Anemia    Check CBC, CMP, Vitamin B12, Ferritin Needs note for work    Rockne Coons, DO 04/23/2012 6:24 PM

## 2012-04-24 LAB — COMPREHENSIVE METABOLIC PANEL
ALT: 22 U/L (ref 0–35)
AST: 20 U/L (ref 0–37)
Albumin: 4.4 g/dL (ref 3.5–5.2)
Alkaline Phosphatase: 51 U/L (ref 39–117)
BUN: 18 mg/dL (ref 6–23)
CO2: 30 mEq/L (ref 19–32)
Calcium: 9.7 mg/dL (ref 8.4–10.5)
Chloride: 102 mEq/L (ref 96–112)
Creat: 0.63 mg/dL (ref 0.50–1.10)
Glucose, Bld: 92 mg/dL (ref 70–99)
Potassium: 3.8 mEq/L (ref 3.5–5.3)
Sodium: 139 mEq/L (ref 135–145)
Total Bilirubin: 0.4 mg/dL (ref 0.3–1.2)
Total Protein: 7.3 g/dL (ref 6.0–8.3)

## 2012-04-24 LAB — VITAMIN B12: Vitamin B-12: 368 pg/mL (ref 211–911)

## 2012-04-24 LAB — FERRITIN: Ferritin: 65 ng/mL (ref 10–291)

## 2012-04-27 ENCOUNTER — Telehealth: Payer: Self-pay | Admitting: Family Medicine

## 2012-04-27 NOTE — Telephone Encounter (Signed)
Spoke to pt about labs 

## 2012-05-14 ENCOUNTER — Encounter: Payer: Self-pay | Admitting: Family Medicine

## 2012-05-28 ENCOUNTER — Other Ambulatory Visit: Payer: Self-pay | Admitting: *Deleted

## 2012-05-28 ENCOUNTER — Ambulatory Visit: Payer: No Typology Code available for payment source

## 2012-05-28 ENCOUNTER — Ambulatory Visit (INDEPENDENT_AMBULATORY_CARE_PROVIDER_SITE_OTHER): Payer: No Typology Code available for payment source | Admitting: Emergency Medicine

## 2012-05-28 VITALS — BP 166/66 | HR 81 | Temp 98.7°F | Resp 18 | Ht 59.75 in | Wt 160.8 lb

## 2012-05-28 DIAGNOSIS — R05 Cough: Secondary | ICD-10-CM

## 2012-05-28 DIAGNOSIS — R059 Cough, unspecified: Secondary | ICD-10-CM

## 2012-05-28 DIAGNOSIS — R062 Wheezing: Secondary | ICD-10-CM

## 2012-05-28 DIAGNOSIS — J209 Acute bronchitis, unspecified: Secondary | ICD-10-CM

## 2012-05-28 MED ORDER — AZITHROMYCIN 250 MG PO TABS: ORAL_TABLET | ORAL | Status: DC

## 2012-05-28 MED ORDER — IPRATROPIUM BROMIDE 0.02 % IN SOLN
0.5000 mg | Freq: Once | RESPIRATORY_TRACT | Status: AC
  Administered 2012-05-28: 0.5 mg via RESPIRATORY_TRACT

## 2012-05-28 MED ORDER — ALBUTEROL SULFATE (2.5 MG/3ML) 0.083% IN NEBU
2.5000 mg | INHALATION_SOLUTION | Freq: Once | RESPIRATORY_TRACT | Status: AC
  Administered 2012-05-28: 2.5 mg via RESPIRATORY_TRACT

## 2012-05-28 NOTE — Progress Notes (Signed)
[facility] [address] [place] [address] [phone]  Date:  05/28/2012   Name:  Destiny Ballard   DOB:  [birth year]   MRN:  [id number]  PCP:  Tonye Pearson, MD    Chief Complaint: Wheezing, Cough and Nasal Congestion   History of Present Illness:  Destiny Ballard is a 61 y.o. very pleasant female patient who presents with the following:  Ill since Sunday when she developed a sore throat.  Has marked nasal drainage and post nasal drip that is purulent in nature.  No fever but has chills.  Cough productive of scant sputum.  Associate with wheezing.  No nausea or vomiting.  No stool change.  Some stress incontinence of urine.  Moderate exertional dyspnea.  Taking OTC meds and not compliant with MDI's.  Only using albuterol 1 puff daily.  Can't afford the Atrovent  Patient Active Problem List  Diagnosis  . HYPERLIPIDEMIA  . MORBID OBESITY  . ANEMIA-IRON DEFICIENCY  . HYPERTENSION  . CORONARY ARTERY DISEASE  . GERD  . HEADACHE, CHRONIC  . COUGH  . Chest pain  . Abscess of axilla  . Cellulitis of axilla, left    Past Medical History  Diagnosis Date  . CAD (coronary artery disease)     a) s/p DES to LAD 07/2005 b) Last Myoview low risk 11/2011 showing small fixed apical perfusion defect (prior MI vs attenuation) but no ischemia - normal EF.  [name] Hypertension   . Hyperlipidemia   . Obesity   . Anxiety     Prior suicide attempt  . Depression   . Cervical spondylosis   . Insulin resistance   . Iron deficiency anemia   . GERD (gastroesophageal reflux disease)     Past Surgical History  Procedure Date  . Cardiac catheterization 06/17/2007    NORMAL. EF 60%  . Coronary stent placement 07/2005    LEFT ANTERIOR DESCENDING  . Tubal ligation   . Tonsillectomy and adenoidectomy   . Childbirth     X3  . Breast enhancement surgery   . Cervical spondylosis     SINGLE LEVEL FUSION  . Incision and drainage breast abscess 01/05/2012       .  Forearm fracture surgery 2010    hand and shoulder     History  Substance Use Topics  . Smoking status: Never Smoker   . Smokeless tobacco: Never Used  . Alcohol Use: 0.6 oz/week    1 Glasses of wine per week     Comment: social    Family History  Problem Relation Age of Onset  . Heart attack Mother   . Diabetes Mother   . Cancer Mother   . Suicidality Father     Allergies  Allergen Reactions  . Prednisone     REACTION: mood swings, nightmares. "Shot doesn't bother me, reaction is just with the pill"    Medication list has been reviewed and updated.  Current Outpatient Prescriptions on File Prior to Visit  Medication Sig Dispense Refill  . amLODipine (NORVASC) 10 MG tablet Take 1 tablet (10 mg total) by mouth daily.  90 tablet  1  . aspirin 81 MG tablet Take 81 mg by mouth daily.        . Cholecalciferol (VITAMIN D3) 1000 UNITS CAPS Take by mouth daily.        . fexofenadine (ALLEGRA) 180 MG tablet Take 180 mg by mouth as needed.       [name]  FLUoxetine (PROZAC) 40 MG capsule Take 1 capsule (40 mg total) by mouth daily.  90 capsule  2  . ipratropium (ATROVENT) 0.06 % nasal spray Place 2 sprays into the nose 4 (four) times daily.  15 mL  2  . metoprolol succinate (TOPROL-XL) 100 MG 24 hr tablet Take 100 mg by mouth daily.      . nitroGLYCERIN (NITROSTAT) 0.4 MG SL tablet Place 0.4 mg under the tongue every 5 (five) minutes as needed.      . rosuvastatin (CRESTOR) 20 MG tablet Take 1 tablet (20 mg total) by mouth daily.  30 tablet  11  . valsartan-hydrochlorothiazide (DIOVAN-HCT) 320-25 MG per tablet Take 1 tablet by mouth daily.      [name] acidophilus (RISAQUAD) CAPS Take 1 capsule by mouth daily.  7 capsule  0  . albuterol (PROVENTIL HFA;VENTOLIN HFA) 108 (90 BASE) MCG/ACT inhaler Inhale 1 puff into the lungs every 6 (six) hours as needed. For shortness of breath, chronic bronchitis  1 Inhaler  11  . chlorpheniramine-HYDROcodone (TUSSIONEX PENNKINETIC ER) 10-8 MG/5ML LQCR Take 5 mLs  by mouth every 12 (twelve) hours as needed (cough).  30 mL  0  . ferrous sulfate 325 (65 FE) MG tablet Take 325 mg by mouth daily with breakfast.      . fluticasone (VERAMYST) 27.5 MCG/SPRAY nasal spray Place 2 sprays into the nose daily.      . fluticasone-salmeterol (ADVAIR HFA) 115-21 MCG/ACT inhaler Inhale 2 puffs into the lungs 2 (two) times daily.  1 Inhaler  12  . pseudoephedrine-guaifenesin (MUCINEX D) 60-600 MG per tablet Take 1 tablet by mouth every 12 (twelve) hours.  18 tablet  0  . Simethicone (GAS-X PO) Take 1 tablet by mouth daily as needed. For gas       . vitamin B-12 (CYANOCOBALAMIN) 1000 MCG tablet Take 1,000 mcg by mouth daily.         Review of Systems:  As per HPI, otherwise negative.    Physical Examination: Filed Vitals:   05/28/12 0929  BP: 166/66  Pulse: 81  Temp: 98.7 F (37.1 C)  Resp: 18   Filed Vitals:   05/28/12 0929  Height: 4' 11.75" (1.518 m)  Weight: 160 lb 12.8 oz (72.938 kg)   Body mass index is 31.67 kg/(m^2). Ideal Body Weight: Weight in (lb) to have BMI = 25: 126.7   GEN: Obese, NAD, Non-toxic, A & O x 3 HEENT: Atraumatic, Normocephalic. Neck supple. No masses, No LAD. Ears and Nose: No external deformity. CV: RRR, No M/G/R. No JVD. No thrill. No extra heart sounds. PULM: CTA B, diffuse wheezing, crackles, rhonchi. No retractions. No resp. distress. No accessory muscle use. ABD: S, NT, ND, +BS. No rebound. No HSM. EXTR: No c/c/e NEURO Normal gait.  PSYCH: Normally interactive. Conversant. Not depressed or anxious appearing.  Calm demeanor.    Assessment and Plan: Non compliance with medications Neb Acute bronchitis zpak Use inhaler 2 puffs q4h as needed tussionex  Carmelina Dane, MD  [facility] reading (PRIMARY) by  Dr. Dareen Piano.  Breast implant.  No infiltrate.

## 2012-05-29 ENCOUNTER — Other Ambulatory Visit: Payer: Self-pay | Admitting: Emergency Medicine

## 2012-05-29 MED ORDER — AZITHROMYCIN 250 MG PO TABS: ORAL_TABLET | ORAL | Status: DC

## 2012-05-29 MED ORDER — HYDROCOD POLST-CHLORPHEN POLST 10-8 MG/5ML PO LQCR: 5.0000 mL | Freq: Two times a day (BID) | ORAL | Status: DC | PRN

## 2012-05-31 ENCOUNTER — Telehealth: Payer: Self-pay

## 2012-05-31 NOTE — Telephone Encounter (Signed)
Pt seen Thursday and rx'd antibiotic, inhaler and cough medicine. Not any better. States she coughs so hard she 'wets allover herself'. She was written out of work to return tomorrow, but doesn't feel she can in this condition. Asks what to do next? Is there another medication to try? Willing to rtc if needed.  Pharmacy: [facility] [address]  [address]

## 2012-06-01 ENCOUNTER — Other Ambulatory Visit: Payer: Self-pay | Admitting: *Deleted

## 2012-06-01 DIAGNOSIS — E785 Hyperlipidemia, unspecified: Secondary | ICD-10-CM

## 2012-06-01 MED ORDER — ROSUVASTATIN CALCIUM 20 MG PO TABS: 20.0000 mg | ORAL_TABLET | Freq: Every day | ORAL | Status: DC

## 2012-06-01 NOTE — Telephone Encounter (Signed)
Does she still have sinus/nasal symptoms, post-nasal drainage, facial pressure? If so, we can change her antibiotic to Augmentin 875 1 PO BID x 10 days.  If not, RTC for re-evaluation.

## 2012-06-01 NOTE — Telephone Encounter (Signed)
Pt stated that her sinus Sxs are mostly gone, it has just moved into her chest and the cough is worse sometimes than others. Advised pt since her resp Sxs are worse now she should RTC for re-eval. Pt agreed.

## 2012-06-03 ENCOUNTER — Ambulatory Visit: Payer: No Typology Code available for payment source | Admitting: Cardiology

## 2012-06-05 NOTE — Progress Notes (Signed)
Reviewed and agree.

## 2012-06-06 ENCOUNTER — Telehealth: Payer: Self-pay

## 2012-06-06 NOTE — Telephone Encounter (Signed)
Has run out of cough med, but still is coughing. She would like a refill on the med.  [facility] [address]

## 2012-06-08 NOTE — Telephone Encounter (Signed)
Was advised last week to return to clinic, called again to advise. She will return to clinic.

## 2012-06-18 ENCOUNTER — Ambulatory Visit (INDEPENDENT_AMBULATORY_CARE_PROVIDER_SITE_OTHER): Payer: No Typology Code available for payment source | Admitting: Family Medicine

## 2012-06-18 VITALS — BP 136/70 | HR 78 | Temp 98.2°F | Resp 17 | Ht 59.5 in | Wt 159.0 lb

## 2012-06-18 DIAGNOSIS — R82998 Other abnormal findings in urine: Secondary | ICD-10-CM

## 2012-06-18 DIAGNOSIS — R8271 Bacteriuria: Secondary | ICD-10-CM

## 2012-06-18 DIAGNOSIS — R109 Unspecified abdominal pain: Secondary | ICD-10-CM

## 2012-06-18 LAB — POCT UA - MICROSCOPIC ONLY
Casts, Ur, LPF, POC: NEGATIVE
Crystals, Ur, HPF, POC: NEGATIVE
Mucus, UA: POSITIVE
Yeast, UA: NEGATIVE

## 2012-06-18 LAB — POCT CBC
Granulocyte percent: 60.4 %G (ref 37–80)
HCT, POC: 41.7 % (ref 37.7–47.9)
Hemoglobin: 12.5 g/dL (ref 12.2–16.2)
Lymph, poc: 2.8 (ref 0.6–3.4)
MCH, POC: 25.3 pg — AB (ref 27–31.2)
MCHC: 30 g/dL — AB (ref 31.8–35.4)
MCV: 84.2 fL (ref 80–97)
MID (cbc): 0.8 (ref 0–0.9)
MPV: 10 fL (ref 0–99.8)
POC Granulocyte: 5.5 (ref 2–6.9)
POC LYMPH PERCENT: 31.2 %L (ref 10–50)
POC MID %: 8.4 %M (ref 0–12)
Platelet Count, POC: 334 10*3/uL (ref 142–424)
RBC: 4.95 M/uL (ref 4.04–5.48)
RDW, POC: 14.2 %
WBC: 9.1 10*3/uL (ref 4.6–10.2)

## 2012-06-18 LAB — POCT URINALYSIS DIPSTICK
Bilirubin, UA: NEGATIVE
Blood, UA: NEGATIVE
Glucose, UA: NEGATIVE
Ketones, UA: NEGATIVE
Nitrite, UA: NEGATIVE
Protein, UA: 30
Spec Grav, UA: >=1.03
Urobilinogen, UA: 0.2
pH, UA: 5.5

## 2012-06-18 MED ORDER — CIPROFLOXACIN HCL 250 MG PO TABS: 250.0000 mg | ORAL_TABLET | Freq: Two times a day (BID) | ORAL | Status: DC

## 2012-06-18 MED ORDER — MELOXICAM 7.5 MG PO TABS: 7.5000 mg | ORAL_TABLET | Freq: Every day | ORAL | Status: DC

## 2012-06-18 NOTE — Progress Notes (Signed)
[facility] [address], [place] [address] [phone]  Date:  06/18/2012   Name:  Destiny Ballard   DOB:  21-Nov-1950   MRN:  [id number]  PCP:  Tonye Pearson, MD    Chief Complaint: Abdominal Pain   History of Present Illness:  Destiny Ballard is a 61 y.o. very pleasant female patient who presents with the following:  She is here with RLQ pain that started about 9 am while she was working.  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  There is no acute injury that she is aware of.  She needed to leave work due to the pain so she came in for evaluation and to have a doctor's note.   No nausea or vomiting. She ate normally this morning.   No fever, chills or aches.  She does not feel ill overall.  However, she does note she she has had urge incontinence for the last month or two.  She also has some tenderness in her right trapezius for the last week or so.  She thinks that she does not have her appendix- she was told that it was removed when she had a BTL in 1972.   We do not have this operative report available.    She did have CT of her abdomen and pelvis in 2008 and 2012- asked one of the radiologists to review these films and he thinks that her appendix is still present. However, the CT was done without contrast so it is difficult to be sure.   Patient Active Problem List  Diagnosis  . HYPERLIPIDEMIA  . MORBID OBESITY  . ANEMIA-IRON DEFICIENCY  . HYPERTENSION  . CORONARY ARTERY DISEASE  . GERD  . HEADACHE, CHRONIC  . COUGH  . Chest pain  . Abscess of axilla  . Cellulitis of axilla, left    Past Medical History  Diagnosis Date  . CAD (coronary artery disease)     a) s/p DES to LAD 07/2005 b) Last Myoview low risk 11/2011 showing small fixed apical perfusion defect (prior MI vs attenuation) but no ischemia - normal EF.  [name] Hypertension   . Hyperlipidemia   . Obesity   . Anxiety     Prior suicide  attempt  . Depression   . Cervical spondylosis   . Insulin resistance   . Iron deficiency anemia   . GERD (gastroesophageal reflux disease)     Past Surgical History  Procedure Date  . Cardiac catheterization 06/17/2007    NORMAL. EF 60%  . Coronary stent placement 07/2005    LEFT ANTERIOR DESCENDING  . Tubal ligation   . Tonsillectomy and adenoidectomy   . Childbirth     X3  . Breast enhancement surgery   . Cervical spondylosis     SINGLE LEVEL FUSION  . Incision and drainage breast abscess 01/05/2012       . Forearm fracture surgery 2010    hand and shoulder     History  Substance Use Topics  . Smoking status: Never Smoker   . Smokeless tobacco: Never Used  . Alcohol Use: 0.6 oz/week    1 Glasses of wine per week     Comment: social    Family History  Problem Relation Age of Onset  . Heart attack Mother   . Diabetes Mother   . Cancer Mother   . Suicidality Father     Allergies  Allergen Reactions  .  Prednisone     REACTION: mood swings, nightmares. "Shot doesn't bother me, reaction is just with the pill"    Medication list has been reviewed and updated.  Current Outpatient Prescriptions on File Prior to Visit  Medication Sig Dispense Refill  . acidophilus (RISAQUAD) CAPS Take 1 capsule by mouth daily.  7 capsule  0  . albuterol (PROVENTIL HFA;VENTOLIN HFA) 108 (90 BASE) MCG/ACT inhaler Inhale 1 puff into the lungs every 6 (six) hours as needed. For shortness of breath, chronic bronchitis  1 Inhaler  11  . amLODipine (NORVASC) 10 MG tablet Take 1 tablet (10 mg total) by mouth daily.  90 tablet  1  . aspirin 81 MG tablet Take 81 mg by mouth daily.        [name] azithromycin (ZITHROMAX) 250 MG tablet Take 2 tabs PO x 1 dose, then 1 tab PO QD x 4 days  6 tablet  0  . chlorpheniramine-HYDROcodone (TUSSIONEX PENNKINETIC ER) 10-8 MG/5ML LQCR Take 5 mLs by mouth every 12 (twelve) hours as needed (cough).  30 mL  0  . chlorpheniramine-HYDROcodone (TUSSIONEX PENNKINETIC  ER) 10-8 MG/5ML LQCR Take 5 mLs by mouth every 12 (twelve) hours as needed (cough).  60 mL  0  . Cholecalciferol (VITAMIN D3) 1000 UNITS CAPS Take by mouth daily.        . ferrous sulfate 325 (65 FE) MG tablet Take 325 mg by mouth daily with breakfast.      . fexofenadine (ALLEGRA) 180 MG tablet Take 180 mg by mouth as needed.       [name] FLUoxetine (PROZAC) 40 MG capsule Take 1 capsule (40 mg total) by mouth daily.  90 capsule  2  . fluticasone (VERAMYST) 27.5 MCG/SPRAY nasal spray Place 2 sprays into the nose daily.      . fluticasone-salmeterol (ADVAIR HFA) 115-21 MCG/ACT inhaler Inhale 2 puffs into the lungs 2 (two) times daily.  1 Inhaler  12  . ipratropium (ATROVENT) 0.06 % nasal spray Place 2 sprays into the nose 4 (four) times daily.  15 mL  2  . metoprolol succinate (TOPROL-XL) 100 MG 24 hr tablet Take 100 mg by mouth daily.      . nitroGLYCERIN (NITROSTAT) 0.4 MG SL tablet Place 0.4 mg under the tongue every 5 (five) minutes as needed.      . pseudoephedrine-guaifenesin (MUCINEX D) 60-600 MG per tablet Take 1 tablet by mouth every 12 (twelve) hours.  18 tablet  0  . rosuvastatin (CRESTOR) 20 MG tablet Take 1 tablet (20 mg total) by mouth daily.  90 tablet  2  . Simethicone (GAS-X PO) Take 1 tablet by mouth daily as needed. For gas       . valsartan-hydrochlorothiazide (DIOVAN-HCT) 320-25 MG per tablet Take 1 tablet by mouth daily.      . vitamin B-12 (CYANOCOBALAMIN) 1000 MCG tablet Take 1,000 mcg by mouth daily.         Review of Systems:  As per HPI- otherwise negative.   Physical Examination: Filed Vitals:   06/18/12 1128  BP: 136/70  Pulse: 78  Temp: 98.2 F (36.8 C)  Resp: 17   Filed Vitals:   06/18/12 1128  Height: 4' 11.5" (1.511 m)  Weight: 159 lb (72.122 kg)   Body mass index is 31.58 kg/(m^2). Ideal Body Weight: Weight in (lb) to have BMI = 25: 125.6   GEN: WDWN, NAD, Non-toxic, A & O x 3, overweight HEENT: Atraumatic, Normocephalic. Neck supple. No masses, No  LAD.  Bilateral TM wnl, oropharynx normal.  PEERL,EOMI.   Ears and Nose: No external deformity. CV: RRR, No M/G/R. No JVD. No thrill. No extra heart sounds. PULM: CTA B, no wheezes, crackles, rhonchi. No retractions. No resp. distress. No accessory muscle use. ABD: S, obese, ND, +BS. No rebound. No HSM.  She does have tenderness very low in her RLQ- however it is so low as to not be in the abdominal cavity.  It seems that she is tender over her right hip flexor muscles.  When she flexes her hip against resistance she has pain in these muscles. No tenderness over McBurney's point.   EXTR: No c/c/e NEURO Normal gait.  PSYCH: Normally interactive. Conversant. Not depressed or anxious appearing.  Calm demeanor.   Results for orders placed in visit on 06/18/12  POCT CBC      Component Value Range   WBC 9.1  4.6 - 10.2 K/uL   Lymph, poc 2.8  0.6 - 3.4   POC LYMPH PERCENT 31.2  10 - 50 %L   MID (cbc) 0.8  0 - 0.9   POC MID % 8.4  0 - 12 %M   POC Granulocyte 5.5  2 - 6.9   Granulocyte percent 60.4  37 - 80 %G   RBC 4.95  4.04 - 5.48 M/uL   Hemoglobin 12.5  12.2 - 16.2 g/dL   HCT, POC 16.1  09.6 - 47.9 %   MCV 84.2  80 - 97 fL   MCH, POC 25.3 (*) 27 - 31.2 pg   MCHC 30.0 (*) 31.8 - 35.4 g/dL   RDW, POC 04.5     Platelet Count, POC 334  142 - 424 K/uL   MPV 10.0  0 - 99.8 fL  POCT UA - MICROSCOPIC ONLY      Component Value Range   WBC, Ur, HPF, POC 15-20     RBC, urine, microscopic 2-6     Bacteria, U Microscopic 1+     Mucus, UA positive     Epithelial cells, urine per micros 3-5     Crystals, Ur, HPF, POC neg     Casts, Ur, LPF, POC neg     Yeast, UA neg    POCT URINALYSIS DIPSTICK      Component Value Range   Color, UA amber     Clarity, UA clear     Glucose, UA neg     Bilirubin, UA neg     Ketones, UA neg     Spec Grav, UA >=1.030     Blood, UA neg     pH, UA 5.5     Protein, UA 30     Urobilinogen, UA 0.2     Nitrite, UA neg     Leukocytes, UA Trace       Assessment  and Plan: 1. Abdominal  pain, other specified site  POCT CBC, POCT UA - Microscopic Only, POCT urinalysis dipstick, meloxicam (MOBIC) 7.5 MG tablet  2. Bacteria in urine  ciprofloxacin (CIPRO) 250 MG tablet, Urine culture   Detailed discussion with Destiny Ballard and her husband.  It is possible that her symptoms could represent acute appendicitis, but this is less likely given the character of her tenderness, her normal WBC count, normal pulse and temperature, and lack of nausea/ vomiting/ anorexia.  Also, we have 2 more likely diagnoses of hip flexor strain and UTI.  Will culture urine and treat her with cipro and mobic as needed.  Discussed doing a CT to  absolutely rule- out appendicitis.  However, after talking about the risks and benefits of doing a CT Bellany and her husband wished to defer for now. They do understand that if Kamylah did have appendicitis delaying a CT could increase her risk of perforation and complications.  They agree to follow- up closely tomorrow am if she is not better, and to go to the ED if worse.    Also discussed a right trapezius strain- mobic should help this as well.  Encouraged frequent kegel exercises to alleviate her urge incontinence symptoms.   Abbe Amsterdam, MD

## 2012-06-18 NOTE — Patient Instructions (Addendum)
Be sure to come back here tomorrow first thing in the morning if you have persistent pain, nausea/ vomiting or fever.  If you get worse over night go to the ER.    Work on "tightening" exercises- do at leas 100 a day.  Keep this up for a few weeks to see if it will help  I will let you know the results of your urine culture as soon as they come in

## 2012-06-19 ENCOUNTER — Encounter: Payer: Self-pay | Admitting: Family Medicine

## 2012-06-19 ENCOUNTER — Other Ambulatory Visit (HOSPITAL_COMMUNITY): Payer: Self-pay | Admitting: Vascular Surgery

## 2012-06-19 ENCOUNTER — Ambulatory Visit: Payer: No Typology Code available for payment source

## 2012-06-19 ENCOUNTER — Other Ambulatory Visit: Payer: Self-pay | Admitting: Radiology

## 2012-06-19 ENCOUNTER — Telehealth: Payer: Self-pay

## 2012-06-19 ENCOUNTER — Ambulatory Visit (HOSPITAL_COMMUNITY)
Admission: RE | Admit: 2012-06-19 | Discharge: 2012-06-19 | Disposition: A | Discharge status: Home / Self Care | Payer: No Typology Code available for payment source | Source: Ambulatory Visit | Attending: Family Medicine | Admitting: Family Medicine

## 2012-06-19 DIAGNOSIS — M79669 Pain in unspecified lower leg: Secondary | ICD-10-CM

## 2012-06-19 DIAGNOSIS — M79609 Pain in unspecified limb: Secondary | ICD-10-CM | POA: Insufficient documentation

## 2012-06-19 NOTE — Telephone Encounter (Signed)
I have advised patient to come in, she is having calf pain and cramping, I told her to come in now, so we can make sure she is not getting a blood clot.

## 2012-06-19 NOTE — Telephone Encounter (Signed)
I have spoken to Dr Patsy Lager, okay do [place] to r/o DVT then patient can come in. Doppler sch for today at 11 at [facility]. Patient is going now for the [place]

## 2012-06-19 NOTE — Telephone Encounter (Signed)
Pt states saw dr copland recently for leg cramps and states not better. Pt states she has been up since 3am and didn't go to work. Pt asks for a call back to discuss other treatment options and also needs an oow note for today.  Bf

## 2012-06-19 NOTE — Progress Notes (Signed)
*  PRELIMINARY RESULTS* Vascular Ultrasound Carotid Duplex (Doppler) has been completed.  Preliminary findings: Right:  No evidence of DVT, superficial thrombosis, or Baker's cyst.  Called report to Amy.  Farrel Demark, RDMS, RVT 06/19/2012, 11:40 AM

## 2012-06-19 NOTE — Telephone Encounter (Signed)
Called Maison to discuss- she states that she did come in to clinic today (I was not aware) but he wait was too long so she left.   Let her know that her ultrasound did not show a blood clot.  She was seen for leg cramps by Dr. Conley Rolls in October, and her electrolytes were normal.  From the description I got this am she was having unusual, isolated right calf pain.  She was therefore sent for an ultrasound.  However, it may be that these are just the cramps she has been having.    As for her abdominal/ hip flexor pain 'It's still bothering me."  However, her pain is not severe and she is not vomiting.  She did know that the plan for for a recheck today if her belly was still a problem.  However, she does not feel that this is necessary.  Did urge her to come in first thing tomorrow if she is still in pain.

## 2012-06-20 ENCOUNTER — Encounter: Payer: Self-pay | Admitting: Family Medicine

## 2012-06-20 LAB — URINE CULTURE: Colony Count: 15000

## 2012-06-28 ENCOUNTER — Other Ambulatory Visit: Payer: Self-pay | Admitting: Emergency Medicine

## 2012-07-22 ENCOUNTER — Ambulatory Visit (INDEPENDENT_AMBULATORY_CARE_PROVIDER_SITE_OTHER): Payer: No Typology Code available for payment source | Admitting: Family Medicine

## 2012-07-22 ENCOUNTER — Encounter (HOSPITAL_COMMUNITY): Payer: Self-pay | Admitting: *Deleted

## 2012-07-22 ENCOUNTER — Emergency Department (HOSPITAL_COMMUNITY)
Admission: EM | Admit: 2012-07-22 | Discharge: 2012-07-22 | Disposition: A | Discharge status: Home / Self Care | Payer: No Typology Code available for payment source | Attending: Emergency Medicine | Admitting: Emergency Medicine

## 2012-07-22 ENCOUNTER — Ambulatory Visit: Payer: No Typology Code available for payment source

## 2012-07-22 VITALS — BP 144/64 | HR 71 | Temp 98.1°F | Resp 18 | Ht 59.0 in | Wt 159.8 lb

## 2012-07-22 DIAGNOSIS — R05 Cough: Secondary | ICD-10-CM

## 2012-07-22 DIAGNOSIS — R059 Cough, unspecified: Secondary | ICD-10-CM

## 2012-07-22 DIAGNOSIS — F329 Major depressive disorder, single episode, unspecified: Secondary | ICD-10-CM | POA: Insufficient documentation

## 2012-07-22 DIAGNOSIS — R109 Unspecified abdominal pain: Secondary | ICD-10-CM

## 2012-07-22 DIAGNOSIS — Z9889 Other specified postprocedural states: Secondary | ICD-10-CM | POA: Insufficient documentation

## 2012-07-22 DIAGNOSIS — I2510 Atherosclerotic heart disease of native coronary artery without angina pectoris: Secondary | ICD-10-CM | POA: Insufficient documentation

## 2012-07-22 DIAGNOSIS — Z981 Arthrodesis status: Secondary | ICD-10-CM | POA: Insufficient documentation

## 2012-07-22 DIAGNOSIS — R42 Dizziness and giddiness: Secondary | ICD-10-CM | POA: Insufficient documentation

## 2012-07-22 DIAGNOSIS — Z8739 Personal history of other diseases of the musculoskeletal system and connective tissue: Secondary | ICD-10-CM | POA: Insufficient documentation

## 2012-07-22 DIAGNOSIS — D509 Iron deficiency anemia, unspecified: Secondary | ICD-10-CM | POA: Insufficient documentation

## 2012-07-22 DIAGNOSIS — IMO0002 Reserved for concepts with insufficient information to code with codable children: Secondary | ICD-10-CM | POA: Insufficient documentation

## 2012-07-22 DIAGNOSIS — E785 Hyperlipidemia, unspecified: Secondary | ICD-10-CM | POA: Insufficient documentation

## 2012-07-22 DIAGNOSIS — F3289 Other specified depressive episodes: Secondary | ICD-10-CM | POA: Insufficient documentation

## 2012-07-22 DIAGNOSIS — Z7982 Long term (current) use of aspirin: Secondary | ICD-10-CM | POA: Insufficient documentation

## 2012-07-22 DIAGNOSIS — K219 Gastro-esophageal reflux disease without esophagitis: Secondary | ICD-10-CM | POA: Insufficient documentation

## 2012-07-22 DIAGNOSIS — M549 Dorsalgia, unspecified: Secondary | ICD-10-CM

## 2012-07-22 DIAGNOSIS — Z79899 Other long term (current) drug therapy: Secondary | ICD-10-CM | POA: Insufficient documentation

## 2012-07-22 DIAGNOSIS — R0602 Shortness of breath: Secondary | ICD-10-CM | POA: Insufficient documentation

## 2012-07-22 DIAGNOSIS — F411 Generalized anxiety disorder: Secondary | ICD-10-CM | POA: Insufficient documentation

## 2012-07-22 DIAGNOSIS — M545 Low back pain, unspecified: Secondary | ICD-10-CM | POA: Insufficient documentation

## 2012-07-22 DIAGNOSIS — R0789 Other chest pain: Secondary | ICD-10-CM

## 2012-07-22 DIAGNOSIS — R062 Wheezing: Secondary | ICD-10-CM

## 2012-07-22 DIAGNOSIS — E669 Obesity, unspecified: Secondary | ICD-10-CM | POA: Insufficient documentation

## 2012-07-22 DIAGNOSIS — I10 Essential (primary) hypertension: Secondary | ICD-10-CM | POA: Insufficient documentation

## 2012-07-22 LAB — CBC WITH DIFFERENTIAL/PLATELET
Basophils Absolute: 0 10*3/uL (ref 0.0–0.1)
Basophils Relative: 0 % (ref 0–1)
Eosinophils Absolute: 0.2 10*3/uL (ref 0.0–0.7)
Eosinophils Relative: 2 % (ref 0–5)
HCT: 37.9 % (ref 36.0–46.0)
Hemoglobin: 12 g/dL (ref 12.0–15.0)
Lymphocytes Relative: 31 % (ref 12–46)
Lymphs Abs: 2.9 10*3/uL (ref 0.7–4.0)
MCH: 25.6 pg — ABNORMAL LOW (ref 26.0–34.0)
MCHC: 31.7 g/dL (ref 30.0–36.0)
MCV: 81 fL (ref 78.0–100.0)
Monocytes Absolute: 0.6 10*3/uL (ref 0.1–1.0)
Monocytes Relative: 6 % (ref 3–12)
Neutro Abs: 5.7 10*3/uL (ref 1.7–7.7)
Neutrophils Relative %: 60 % (ref 43–77)
Platelets: 274 10*3/uL (ref 150–400)
RBC: 4.68 MIL/uL (ref 3.87–5.11)
RDW: 13.8 % (ref 11.5–15.5)
WBC: 9.5 10*3/uL (ref 4.0–10.5)

## 2012-07-22 LAB — COMPREHENSIVE METABOLIC PANEL
ALT: 19 U/L (ref 0–35)
AST: 27 U/L (ref 0–37)
Albumin: 3.6 g/dL (ref 3.5–5.2)
Alkaline Phosphatase: 68 U/L (ref 39–117)
BUN: 17 mg/dL (ref 6–23)
CO2: 27 mEq/L (ref 19–32)
Calcium: 9.7 mg/dL (ref 8.4–10.5)
Chloride: 103 mEq/L (ref 96–112)
Creatinine, Ser: 0.68 mg/dL (ref 0.50–1.10)
GFR calc Af Amer: >90 mL/min (ref >90)
GFR calc non Af Amer: >90 mL/min (ref >90)
Glucose, Bld: 156 mg/dL — ABNORMAL HIGH (ref 70–99)
Potassium: 3.7 mEq/L (ref 3.5–5.1)
Sodium: 140 mEq/L (ref 135–145)
Total Bilirubin: 0.2 mg/dL — ABNORMAL LOW (ref 0.3–1.2)
Total Protein: 7.5 g/dL (ref 6.0–8.3)

## 2012-07-22 LAB — D-DIMER, QUANTITATIVE: D-Dimer, Quant: <0.27 ug/mL-FEU (ref 0.00–0.48)

## 2012-07-22 LAB — TROPONIN I: Troponin I: <0.3 ng/mL (ref <0.30)

## 2012-07-22 LAB — LIPASE, BLOOD: Lipase: 62 U/L — ABNORMAL HIGH (ref 11–59)

## 2012-07-22 MED ORDER — HYDROCODONE-ACETAMINOPHEN 5-500 MG PO TABS: 1.0000 | ORAL_TABLET | Freq: Three times a day (TID) | ORAL | Status: DC | PRN

## 2012-07-22 MED ORDER — MORPHINE SULFATE 4 MG/ML IJ SOLN
4.0000 mg | Freq: Once | INTRAMUSCULAR | Status: AC
  Administered 2012-07-22: 4 mg via INTRAVENOUS
  Filled 2012-07-22: qty 1

## 2012-07-22 MED ORDER — ALBUTEROL SULFATE (2.5 MG/3ML) 0.083% IN NEBU
2.5000 mg | INHALATION_SOLUTION | Freq: Once | RESPIRATORY_TRACT | Status: AC
  Administered 2012-07-22: 2.5 mg via RESPIRATORY_TRACT

## 2012-07-22 NOTE — Progress Notes (Signed)
[facility] [address] [place] [address] [phone]  Date:  07/22/2012   Name:  Destiny Ballard   DOB:  02-Oct-1950   MRN:  [id number]  PCP:  Tonye Pearson, MD    Chief Complaint: Cough   History of Present Illness:  Destiny Ballard is a 62 y.o. very pleasant female patient who presents with the following:  Here today with symptoms of wheezing and chest pain since yesterday.  She has used her albuterol a few times. She was given the albuterol when she was ill some time in the past but she does not havea history of asthma.   She has a dry cough but this is not new.  He chest feels tight.  She does not notice any nasal drainage.      She last used her albuterol yesterday- none today so far.   She noted a pain under her left arm and along her left side. It runs from her left chest into her left back; she has noted this problem more when she is active.  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   She has a history of CAD- had a stent in 2007.  She had a myoview 11/2011 which was read as low risk.  Her cardiologist is Dr. Shirlee Latch.   She also notes that she has been "losing her balance" for the last week- she has postural hypotension and feels lightheaded when she stands up.   She also continues to note that her urine is "yellow" in color.    Patient Active Problem List  Diagnosis  . HYPERLIPIDEMIA  . MORBID OBESITY  . ANEMIA-IRON DEFICIENCY  . HYPERTENSION  . CORONARY ARTERY DISEASE  . GERD  . HEADACHE, CHRONIC  . COUGH  . Chest pain  . Abscess of axilla  . Cellulitis of axilla, left    Past Medical History  Diagnosis Date  . CAD (coronary artery disease)     a) s/p DES to LAD 07/2005 b) Last Myoview low risk 11/2011 showing small fixed apical perfusion defect (prior MI vs attenuation) but no ischemia - normal EF.  [name] Hypertension   . Hyperlipidemia   . Obesity   . Anxiety    Prior suicide attempt  . Depression   . Cervical spondylosis   . Insulin resistance   . Iron deficiency anemia   . GERD (gastroesophageal reflux disease)     Past Surgical History  Procedure Date  . Cardiac catheterization 06/17/2007    NORMAL. EF 60%  . Coronary stent placement 07/2005    LEFT ANTERIOR DESCENDING  . Tubal ligation   . Tonsillectomy and adenoidectomy   . Childbirth     X3  . Breast enhancement surgery   . Cervical spondylosis     SINGLE LEVEL FUSION  . Incision and drainage breast abscess 01/05/2012       . Forearm fracture surgery 2010    hand and shoulder     History  Substance Use Topics  . Smoking status: Never Smoker   . Smokeless tobacco: Never Used  . Alcohol Use: 0.6 oz/week    1 Glasses of wine per week     Comment: social    Family History  Problem Relation Age of Onset  . Heart attack Mother   . Diabetes Mother   . Cancer Mother   . Suicidality Father  Allergies  Allergen Reactions  . Prednisone     REACTION: mood swings, nightmares. "Shot doesn't bother me, reaction is just with the pill"    Medication list has been reviewed and updated.  Current Outpatient Prescriptions on File Prior to Visit  Medication Sig Dispense Refill  . albuterol (PROVENTIL HFA;VENTOLIN HFA) 108 (90 BASE) MCG/ACT inhaler Inhale 1 puff into the lungs every 6 (six) hours as needed. For shortness of breath, chronic bronchitis  1 Inhaler  11  . amLODipine (NORVASC) 10 MG tablet Take 1 tablet (10 mg total) by mouth daily.  90 tablet  1  . aspirin 81 MG tablet Take 81 mg by mouth daily.        . Cholecalciferol (VITAMIN D3) 1000 UNITS CAPS Take by mouth daily.        . fexofenadine (ALLEGRA) 180 MG tablet Take 180 mg by mouth as needed.       [name] FLUoxetine (PROZAC) 40 MG capsule Take 1 capsule (40 mg total) by mouth daily.  90 capsule  2  . fluticasone (VERAMYST) 27.5 MCG/SPRAY nasal spray Place 2 sprays into the nose daily.      . metoprolol succinate  (TOPROL-XL) 100 MG 24 hr tablet Take 100 mg by mouth daily.      . nitroGLYCERIN (NITROSTAT) 0.4 MG SL tablet Place 0.4 mg under the tongue every 5 (five) minutes as needed.      . rosuvastatin (CRESTOR) 20 MG tablet Take 1 tablet (20 mg total) by mouth daily.  90 tablet  2  . Simethicone (GAS-X PO) Take 1 tablet by mouth daily as needed. For gas       . acidophilus (RISAQUAD) CAPS Take 1 capsule by mouth daily.  7 capsule  0  . ciprofloxacin (CIPRO) 250 MG tablet Take 1 tablet (250 mg total) by mouth 2 (two) times daily.  6 tablet  0  . fluticasone-salmeterol (ADVAIR HFA) 115-21 MCG/ACT inhaler Inhale 2 puffs into the lungs 2 (two) times daily.  1 Inhaler  12  . ipratropium (ATROVENT) 0.06 % nasal spray Place 2 sprays into the nose 4 (four) times daily.  15 mL  2  . meloxicam (MOBIC) 7.5 MG tablet Take 1 tablet (7.5 mg total) by mouth daily.  30 tablet  0    Review of Systems:  As per HPI- otherwise negative.   Physical Examination: Filed Vitals:   07/22/12 1441  BP: 144/64  Pulse: 71  Temp: 98.1 F (36.7 C)  Resp: 18   Filed Vitals:   07/22/12 1441  Height: 4\' 11"  (1.499 m)  Weight: 159 lb 12.8 oz (72.485 kg)   Body mass index is 32.28 kg/(m^2). Ideal Body Weight: Weight in (lb) to have BMI = 25: 123.5   GEN: WDWN, NAD, Non-toxic, A & O x 3, overweight.   HEENT: Atraumatic, Normocephalic. Neck supple. No masses, No LAD. Bilateral TM wnl, oropharynx normal.  PEERL,EOMI.   Ears and Nose: No external deformity. CV: RRR, No M/G/R. No JVD. No thrill. No extra heart sounds. PULM: CTA B, minimal wheezing, crackles, rhonchi. No retractions. No resp. distress. No accessory muscle use. Pressing on left side/ left back under shoulder blade does cause her some pain, but she is not sure if this is the same pain she noted before ABD: S, NT, ND, +BS. No rebound. No HSM. EXTR: No c/c/e NEURO Normal gait.  PSYCH: Normally interactive. Conversant. Not depressed or anxious appearing.  Calm  demeanor.   EKG:  NSR, no  ST elevation or depression.  Compared to old EKG- no significant change noted  [facility] reading (PRIMARY) by  Dr. Patsy Lager. CXR:  Negative, history of implants  She took asa 81 mg at home this am- gave #3 more prior to leaving clinic.   Albuterol neb; did not change her symptoms, O2 sat to 94%,   Assessment and Plan: 1. Cough  DG Chest 2 View  2. Wheezing  DG Chest 2 View, albuterol (PROVENTIL) (2.5 MG/3ML) 0.083% nebulizer solution 2.5 mg  3. Chest tightness  EKG 12-Lead, albuterol (PROVENTIL) (2.5 MG/3ML) 0.083% nebulizer solution 2.5 mg   Kareemah is a 62 year old woman with a history of CAD, HTN and high cholesterol.  She is here today with left sided chest pain and hypoxemia.  It seems most likely that her pain is MSK and that her SOB is due to RAD.  However, explained that I cannot rule- out cardiac ischemia or a PE here at clinic.  To do that she would need cardiac enzymes and possibly a D dimer at the ED.  She reports that she continues to feel discomfort in her chest so she would like to go ahead and have further evaluation.  She declined EMS transport- her husband will drive her to the ED.  Called the ED and alerted he charge nurse.    Assuming that she turns out to have just MSK pain have called in some hydrocodone for her to use to her drug store.    Meds ordered this encounter  Medications  . albuterol (PROVENTIL) (2.5 MG/3ML) 0.083% nebulizer solution 2.5 mg    Sig:   . HYDROcodone-acetaminophen (VICODIN) 5-500 MG per tablet    Sig: Take 1 tablet by mouth every 8 (eight) hours as needed for pain.    Dispense:  20 tablet    Refill:  0     Chaysen Tillman, MD

## 2012-07-22 NOTE — ED Provider Notes (Signed)
History     CSN: [id number]  Arrival date & time 07/22/12  1750   First MD Initiated Contact with Patient 07/22/12 1918      Chief Complaint  Patient presents with  . Back Pain    (Consider location/radiation/quality/duration/timing/severity/associated sxs/prior treatment) Patient is a 62 y.o. female presenting with back pain. The history is provided by the patient.  Back Pain  Pertinent negatives include no chest pain, no numbness, no headaches, no abdominal pain and no weakness.   patient has some pain in her left lower back for the last few days. She began to feel a little bit of dizziness, which is felt previously with her heart issues. EKG and chest x-ray were done at primary care. They were reassuring. She does move heavy things at work and thinks this may cause the pain. She's had some difficulty breathing. She states with cold air she sometimes will have trouble breathing. She states her husband is a smoker and this bothers her lungs also. No hemoptysis. Occasional cough. She feels a lot better after breathing treatment or not although improved. She was sent in for further cardiac and possibly PE evaluation.  Past Medical History  Diagnosis Date  . Hypertension   . Hyperlipidemia   . Obesity   . Anxiety     Prior suicide attempt  . Depression   . Insulin resistance   . Iron deficiency anemia   . GERD (gastroesophageal reflux disease)   . Cervical spondylosis   . CAD (coronary artery disease)     a) s/p DES to LAD 07/2005 b) Last Myoview low risk 11/2011 showing small fixed apical perfusion defect (prior MI vs attenuation) but no ischemia - normal EF.    Past Surgical History  Procedure Date  . Cardiac catheterization 06/17/2007    NORMAL. EF 60%  . Coronary stent placement 07/2005    LEFT ANTERIOR DESCENDING  . Tubal ligation   . Tonsillectomy and adenoidectomy   . Childbirth     X3  . Breast enhancement surgery   . Cervical spondylosis     SINGLE LEVEL FUSION  .  Incision and drainage breast abscess 01/05/2012       . Forearm fracture surgery 2010    hand and shoulder     Family History  Problem Relation Age of Onset  . Heart attack Mother   . Diabetes Mother   . Cancer Mother   . Suicidality Father     History  Substance Use Topics  . Smoking status: Never Smoker   . Smokeless tobacco: Never Used  . Alcohol Use: 0.6 oz/week    1 Glasses of wine per week     Comment: social    OB History    Grav Para Term Preterm Abortions TAB SAB Ect Mult Living                  Review of Systems  Constitutional: Negative for activity change and appetite change.  HENT: Negative for neck stiffness.   Eyes: Negative for pain.  Respiratory: Positive for cough and shortness of breath. Negative for chest tightness.   Cardiovascular: Negative for chest pain and leg swelling.  Gastrointestinal: Negative for nausea, vomiting, abdominal pain and diarrhea.  Genitourinary: Negative for flank pain.  Musculoskeletal: Positive for back pain.  Skin: Negative for rash.  Neurological: Positive for light-headedness. Negative for weakness, numbness and headaches.  Psychiatric/Behavioral: Negative for behavioral problems.    Allergies  Prednisone  Home Medications   Current  Outpatient Rx  Name  Route  Sig  Dispense  Refill  . RISAQUAD PO CAPS   Oral   Take 1 capsule by mouth daily as needed. For probiotic         . ALBUTEROL SULFATE HFA 108 (90 BASE) MCG/ACT IN AERS   Inhalation   Inhale 1 puff into the lungs every 6 (six) hours as needed. For shortness of breath, chronic bronchitis         . AMLODIPINE BESYLATE 10 MG PO TABS   Oral   Take 10 mg by mouth daily.         . ASPIRIN EC 81 MG PO TBEC   Oral   Take 81 mg by mouth daily.         [name] VITAMIN D3 1000 UNITS PO CAPS   Oral   Take 1,000 Units by mouth daily.          [name] FEXOFENADINE HCL 180 MG PO TABS   Oral   Take 180 mg by mouth as needed.          [name] FLUOXETINE HCL 40 MG  PO CAPS   Oral   Take 1 capsule (40 mg total) by mouth daily.   90 capsule   2   . FLUTICASONE FUROATE 27.5 MCG/SPRAY NA SUSP   Nasal   Place 2 sprays into the nose daily.         [name] FLUTICASONE-SALMETEROL 115-21 MCG/ACT IN AERO   Inhalation   Inhale 2 puffs into the lungs 2 (two) times daily.   1 Inhaler   12   . HYDROCODONE-ACETAMINOPHEN 5-500 MG PO TABS   Oral   Take 1 tablet by mouth every 8 (eight) hours as needed. For pain         . IPRATROPIUM BROMIDE 0.06 % NA SOLN   Nasal   Place 2 sprays into the nose 4 (four) times daily.   15 mL   2   . MELOXICAM 7.5 MG PO TABS   Oral   Take 1 tablet (7.5 mg total) by mouth daily.   30 tablet   0   . METOPROLOL SUCCINATE ER 100 MG PO TB24   Oral   Take 100 mg by mouth daily.         [name] NITROGLYCERIN 0.4 MG SL SUBL   Sublingual   Place 0.4 mg under the tongue every 5 (five) minutes as needed.         [name] ROSUVASTATIN CALCIUM 20 MG PO TABS   Oral   Take 1 tablet (20 mg total) by mouth daily.   90 tablet   2   . GAS-X PO   Oral   Take 1 tablet by mouth daily as needed. For gas            BP 130/67  Pulse 70  Temp 98.1 F (36.7 C) (Oral)  Resp 16  SpO2 94%  Physical Exam  Nursing note and vitals reviewed. Constitutional: She is oriented to person, place, and time. She appears well-developed and well-nourished.  HENT:  Head: Normocephalic and atraumatic.  Eyes: EOM are normal. Pupils are equal, round, and reactive to light.  Neck: Normal range of motion. Neck supple.  Cardiovascular: Normal rate, regular rhythm and normal heart sounds.   No murmur heard. Pulmonary/Chest: Effort normal and breath sounds normal. No respiratory distress. She has no wheezes. She has no rales. She exhibits tenderness.       Tenderness to left posterior  lower chest wall. No crepitance or deformity. No rash. Mild tenderness in left axilla.  Abdominal: Soft. Bowel sounds are normal. She exhibits no distension. There is no  tenderness. There is no rebound and no guarding.  Musculoskeletal: Normal range of motion.  Neurological: She is alert and oriented to person, place, and time. No cranial nerve deficit.  Skin: Skin is warm and dry.  Psychiatric: She has a normal mood and affect. Her speech is normal.    ED Course  Procedures (including critical care time)  Labs Reviewed  CBC WITH DIFFERENTIAL - Abnormal; Notable for the following:    MCH 25.6 (*)     All other components within normal limits  COMPREHENSIVE METABOLIC PANEL - Abnormal; Notable for the following:    Glucose, Bld 156 (*)     Total Bilirubin 0.2 (*)     All other components within normal limits  LIPASE, BLOOD - Abnormal; Notable for the following:    Lipase 62 (*)     All other components within normal limits  TROPONIN I  D-DIMER, QUANTITATIVE  LAB REPORT - SCANNED   Dg Chest 2 View  07/22/2012  *RADIOLOGY REPORT*  Clinical Data: Wheezing, cough  CHEST - 2 VIEW  Comparison: 05/28/2012 and preliminary reading of Dr. Patsy Lager  Findings: Cardiomediastinal silhouette is stable.  No acute infiltrate or pleural effusion.  Bilateral breast implants are noted.  Bony thorax is unremarkable.  IMPRESSION:  No active disease.  No significant change.   Original Report Authenticated By: Natasha Mead, M.D.      1. Back pain       MDM  Patient with back pain. Likely musculoskeletal. Does not appear to be cardiac or pulmonary embolism. X-ray reviewed from PCPs office. EKG reviewed from PCPs office.        Juliet Rude. Rubin Payor, MD 07/24/12 1610

## 2012-07-22 NOTE — Patient Instructions (Addendum)
Please proceed to [facility].  I will call and let them know that you are coming.

## 2012-07-22 NOTE — ED Notes (Signed)
Pt was seen at [facility] for L sided back pain, "as if someone someone is pushing their thumb into her back".  Tues she exp chest pressure, but no longer feels those s/s.  [facility] performed EKG and chest X-ray, all which were normal and she was referred here.  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

## 2012-08-14 ENCOUNTER — Telehealth: Payer: Self-pay

## 2012-08-14 ENCOUNTER — Ambulatory Visit (INDEPENDENT_AMBULATORY_CARE_PROVIDER_SITE_OTHER): Payer: No Typology Code available for payment source | Admitting: Family Medicine

## 2012-08-14 VITALS — BP 164/74 | HR 88 | Temp 98.7°F | Resp 17 | Ht 60.0 in | Wt 162.0 lb

## 2012-08-14 DIAGNOSIS — F419 Anxiety disorder, unspecified: Secondary | ICD-10-CM

## 2012-08-14 DIAGNOSIS — F329 Major depressive disorder, single episode, unspecified: Secondary | ICD-10-CM

## 2012-08-14 DIAGNOSIS — F411 Generalized anxiety disorder: Secondary | ICD-10-CM

## 2012-08-14 DIAGNOSIS — F32A Depression, unspecified: Secondary | ICD-10-CM

## 2012-08-14 DIAGNOSIS — H9319 Tinnitus, unspecified ear: Secondary | ICD-10-CM

## 2012-08-14 MED ORDER — CLONAZEPAM 0.5 MG PO TABS: 0.5000 mg | ORAL_TABLET | Freq: Two times a day (BID) | ORAL | Status: DC | PRN

## 2012-08-14 NOTE — Telephone Encounter (Signed)
Spoke with her- she wants to be seen for persistent tinnitus

## 2012-08-14 NOTE — Patient Instructions (Addendum)
Let's try clonazepam for your anxiety.  Start with a 1/2 tablet, but you may increase to a whole tablet twice a day if needed.  It should help you to sleep.  Avoid taking it at the same time as other sedating medications (such as vicodin).  Also, avoid using it when you need to drive.  If you are not feeling better in the next few days please give me a call- Sooner if worse.

## 2012-08-14 NOTE — Progress Notes (Signed)
[facility] [address] [place] [address] [phone]  Date:  08/14/2012   Name:  Destiny Ballard   DOB:  09/07/1950   MRN:  [id number]  PCP:  Tonye Pearson, MD    Chief Complaint: Anxiety   History of Present Illness:  Destiny Ballard is a 62 y.o. very pleasant female patient who presents with the following:  She was here earlier this month with an episode of CP.  She was ruled- out for cardiac issues or PE.  She is here today with recurrent anxiety.  She has suffered from anxiety in the past- her symptoms have been worse for the last 2 months.   Destiny Ballard has had a lot of trouble with her job as of late- this has added to her stress level and anxiety.  She notes that her job is becoming harder for her as she gets older, especially with the cold weather.  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  She states she cannot retire for about 3 more years.    She does take prozac still.  She states she has not used other medication for her nerves in the past.   She denies any SI or HI.  She states that the rest of her life is ok, "I just hate my job."   She states she is not sleeping very well- tends to wake up early and several times during the night.   Poor appetite.  She does not have much energy for enjoyable activities.    Patient Active Problem List  Diagnosis  . HYPERLIPIDEMIA  . MORBID OBESITY  . ANEMIA-IRON DEFICIENCY  . HYPERTENSION  . CORONARY ARTERY DISEASE  . GERD  . HEADACHE, CHRONIC  . COUGH  . Chest pain  . Abscess of axilla  . Cellulitis of axilla, left    Past Medical History  Diagnosis Date  . Hypertension   . Hyperlipidemia   . Obesity   . Anxiety     Prior suicide attempt  . Depression   . Insulin resistance   . Iron deficiency anemia   . GERD (gastroesophageal reflux disease)   .  Cervical spondylosis   . CAD (coronary artery disease)     a) s/p DES to LAD 07/2005 b) Last Myoview low risk 11/2011 showing small fixed apical perfusion defect (prior MI vs attenuation) but no ischemia - normal EF.    Past Surgical History  Procedure Date  . Cardiac catheterization 06/17/2007    NORMAL. EF 60%  . Coronary stent placement 07/2005    LEFT ANTERIOR DESCENDING  . Tubal ligation   . Tonsillectomy and adenoidectomy   . Childbirth     X3  . Breast enhancement surgery   . Cervical spondylosis     SINGLE LEVEL FUSION  . Incision and drainage breast abscess 01/05/2012       . Forearm fracture surgery 2010    hand and shoulder     History  Substance Use Topics  . Smoking status: Never Smoker   . Smokeless tobacco: Never Used  . Alcohol Use: 0.6 oz/week    1 Glasses of wine per week     Comment: social    Family History  Problem Relation Age of Onset  . Heart attack Mother   . Diabetes Mother   .  Cancer Mother   . Suicidality Father     Allergies  Allergen Reactions  . Prednisone     REACTION: mood swings, nightmares. "Shot doesn't bother me, reaction is just with the pill"    Medication list has been reviewed and updated.  Current Outpatient Prescriptions on File Prior to Visit  Medication Sig Dispense Refill  . acidophilus (RISAQUAD) CAPS Take 1 capsule by mouth daily as needed. For probiotic      . albuterol (PROVENTIL HFA;VENTOLIN HFA) 108 (90 BASE) MCG/ACT inhaler Inhale 1 puff into the lungs every 6 (six) hours as needed. For shortness of breath, chronic bronchitis      . amLODipine (NORVASC) 10 MG tablet Take 10 mg by mouth daily.      [name] aspirin EC 81 MG tablet Take 81 mg by mouth daily.      . Cholecalciferol (VITAMIN D3) 1000 UNITS CAPS Take 1,000 Units by mouth daily.       . fexofenadine (ALLEGRA) 180 MG tablet Take 180 mg by mouth as needed.       [name] FLUoxetine (PROZAC) 40 MG capsule Take 1 capsule (40 mg total) by mouth daily.  90 capsule  2  .  fluticasone (VERAMYST) 27.5 MCG/SPRAY nasal spray Place 2 sprays into the nose daily.      . fluticasone-salmeterol (ADVAIR HFA) 115-21 MCG/ACT inhaler Inhale 2 puffs into the lungs 2 (two) times daily.  1 Inhaler  12  . HYDROcodone-acetaminophen (VICODIN) 5-500 MG per tablet Take 1 tablet by mouth every 8 (eight) hours as needed. For pain      . ipratropium (ATROVENT) 0.06 % nasal spray Place 2 sprays into the nose 4 (four) times daily.  15 mL  2  . meloxicam (MOBIC) 7.5 MG tablet Take 1 tablet (7.5 mg total) by mouth daily.  30 tablet  0  . metoprolol succinate (TOPROL-XL) 100 MG 24 hr tablet Take 100 mg by mouth daily.      . nitroGLYCERIN (NITROSTAT) 0.4 MG SL tablet Place 0.4 mg under the tongue every 5 (five) minutes as needed.      . rosuvastatin (CRESTOR) 20 MG tablet Take 1 tablet (20 mg total) by mouth daily.  90 tablet  2  . Simethicone (GAS-X PO) Take 1 tablet by mouth daily as needed. For gas         Review of Systems:  As per HPI- otherwise negative.   Physical Examination: Filed Vitals:   08/14/12 0924  BP: 164/74  Pulse: 88  Temp: 98.7 F (37.1 C)  Resp: 17   Filed Vitals:   08/14/12 0924  Height: 5' (1.524 m)  Weight: 162 lb (73.483 kg)   Body mass index is 31.64 kg/(m^2). Ideal Body Weight: Weight in (lb) to have BMI = 25: 127.7   GEN: WDWN, NAD, Non-toxic, A & O x 3, tearful HEENT: Atraumatic, Normocephalic. Neck supple. No masses, No LAD. Ears and Nose: No external deformity. CV: RRR, No M/G/R. No JVD. No thrill. No extra heart sounds. PULM: CTA B, no wheezes, crackles, rhonchi. No retractions. No resp. distress. No accessory muscle use. EXTR: No c/c/e NEURO Normal gait.  PSYCH: Normally interactive but upset  Assessment and Plan: 1. Anxiety  clonazePAM (KLONOPIN) 0.5 MG tablet  2. Depression     Destiny Ballard is suffering from anxiety, as well as depression.  Her most acute problem is anxiety, so will start a low dose of clonazepam. Discussed need to be  cautious of sedation in detail.  If  she does not have significant relief of her symptoms may need to change her prozac to another medication.  Also wrote a letter for her job asking some some modifications to reduce her stress  Abbe Amsterdam, MD

## 2012-08-14 NOTE — Telephone Encounter (Signed)
Do you want to refer to ENT? Who do you recommend?

## 2012-08-14 NOTE — Telephone Encounter (Signed)
Pt saw dr copland today and would like for her to recommend a ent office for her to see  Best number [phone]

## 2012-08-19 ENCOUNTER — Ambulatory Visit (INDEPENDENT_AMBULATORY_CARE_PROVIDER_SITE_OTHER): Payer: No Typology Code available for payment source | Admitting: Family Medicine

## 2012-08-19 VITALS — BP 128/76 | HR 96 | Temp 98.1°F | Resp 18 | Ht 59.0 in | Wt 156.0 lb

## 2012-08-19 DIAGNOSIS — R5383 Other fatigue: Secondary | ICD-10-CM

## 2012-08-19 DIAGNOSIS — R5381 Other malaise: Secondary | ICD-10-CM

## 2012-08-19 DIAGNOSIS — R059 Cough, unspecified: Secondary | ICD-10-CM

## 2012-08-19 DIAGNOSIS — R05 Cough: Secondary | ICD-10-CM

## 2012-08-19 LAB — POCT CBC
Granulocyte percent: 54.4 %G (ref 37–80)
HCT, POC: 42.7 % (ref 37.7–47.9)
Hemoglobin: 13.5 g/dL (ref 12.2–16.2)
Lymph, poc: 1.9 (ref 0.6–3.4)
MCH, POC: 25.5 pg — AB (ref 27–31.2)
MCHC: 31.6 g/dL — AB (ref 31.8–35.4)
MCV: 80.5 fL (ref 80–97)
MID (cbc): 0.5 (ref 0–0.9)
MPV: 9.7 fL (ref 0–99.8)
POC Granulocyte: 2.9 (ref 2–6.9)
POC LYMPH PERCENT: 35.5 %L (ref 10–50)
POC MID %: 10.1 %M (ref 0–12)
Platelet Count, POC: 249 10*3/uL (ref 142–424)
RBC: 5.3 M/uL (ref 4.04–5.48)
RDW, POC: 15.2 %
WBC: 5.4 10*3/uL (ref 4.6–10.2)

## 2012-08-19 LAB — POCT INFLUENZA A/B
Influenza A, POC: NEGATIVE
Influenza B, POC: NEGATIVE

## 2012-08-19 MED ORDER — HYDROCODONE-HOMATROPINE 5-1.5 MG/5ML PO SYRP: 5.0000 mL | ORAL_SOLUTION | Freq: Three times a day (TID) | ORAL | Status: DC | PRN

## 2012-08-19 MED ORDER — CEFDINIR 300 MG PO CAPS: 300.0000 mg | ORAL_CAPSULE | Freq: Two times a day (BID) | ORAL | Status: DC

## 2012-08-19 NOTE — Patient Instructions (Addendum)
We are going to treat you with omnicef for your cough and nasal congestion.  We are also going to use some cough syrup for your cough.  However, do not combine your cough syrup with other sedating medications such as klonopin or vicodin

## 2012-08-19 NOTE — Progress Notes (Signed)
[facility] [address], [place] [address] [phone]  Date:  08/19/2012   Name:  Destiny Ballard   DOB:  01/25/1951   MRN:  [id number]  PCP:  Tonye Pearson, MD    Chief Complaint: Cough, Wheezing, Sore Throat, Diarrhea, Tinnitus and Fatigue   History of Present Illness:  Destiny Ballard is a 62 y.o. very pleasant female patient who presents with the following:  She is here today with illness. She was seen on Friday for anxiety- however later that day she started getting sick with a cough- non productive.  She has checked her temperature- this past Saturday her temp was 100.3.  She also notes nasal congestion, headache, and fatigue.  She notes body aches and chills  She also notes diarrhea- one loose stool a day. No vomiting or abdominal pain She has been using robitussin for her cough.  The cough is keeping her awake at night. She did not get a flu shot this year.   She also has complaint of tinnitus- however this has been present for about one year.  It will wax and wane.  She has been referred to ENT for this problem.    She does not have to use nitroglycerin at all.   She last used her albuterol last night  No hemotypsis, no calf pain or swelling, no travel, no history of DVT/ PE.  She is not a smoker although she is exposed to second hand smoke.  Stable O2 sat over the last month- she was sent to the ED with CP on 07/22/12 and had a negative D dimer at that time  Patient Active Problem List  Diagnosis  . HYPERLIPIDEMIA  . MORBID OBESITY  . ANEMIA-IRON DEFICIENCY  . HYPERTENSION  . CORONARY ARTERY DISEASE  . GERD  . HEADACHE, CHRONIC  . COUGH  . Chest pain  . Abscess of axilla  . Cellulitis of axilla, left    Past Medical History  Diagnosis Date  . Hypertension   . Hyperlipidemia   . Obesity   . Anxiety     Prior suicide attempt  . Depression   . Insulin resistance   . Iron deficiency anemia   . GERD (gastroesophageal reflux  disease)   . Cervical spondylosis   . CAD (coronary artery disease)     a) s/p DES to LAD 07/2005 b) Last Myoview low risk 11/2011 showing small fixed apical perfusion defect (prior MI vs attenuation) but no ischemia - normal EF.    Past Surgical History  Procedure Date  . Cardiac catheterization 06/17/2007    NORMAL. EF 60%  . Coronary stent placement 07/2005    LEFT ANTERIOR DESCENDING  . Tubal ligation   . Tonsillectomy and adenoidectomy   . Childbirth     X3  . Breast enhancement surgery   . Cervical spondylosis     SINGLE LEVEL FUSION  . Incision and drainage breast abscess 01/05/2012       . Forearm fracture surgery 2010    hand and shoulder     History  Substance Use Topics  . Smoking status: Never Smoker   . Smokeless tobacco: Never Used  . Alcohol Use: 0.6 oz/week    1 Glasses of wine per week     Comment: social    Family History  Problem Relation Age of Onset  . Heart attack Mother   . Diabetes Mother   . Cancer Mother   . Suicidality Father  Allergies  Allergen Reactions  . Prednisone     REACTION: mood swings, nightmares. "Shot doesn't bother me, reaction is just with the pill"    Medication list has been reviewed and updated.  Current Outpatient Prescriptions on File Prior to Visit  Medication Sig Dispense Refill  . acidophilus (RISAQUAD) CAPS Take 1 capsule by mouth daily as needed. For probiotic      . albuterol (PROVENTIL HFA;VENTOLIN HFA) 108 (90 BASE) MCG/ACT inhaler Inhale 1 puff into the lungs every 6 (six) hours as needed. For shortness of breath, chronic bronchitis      . amLODipine (NORVASC) 10 MG tablet Take 10 mg by mouth daily.      [name] aspirin EC 81 MG tablet Take 81 mg by mouth daily.      . Cholecalciferol (VITAMIN D3) 1000 UNITS CAPS Take 1,000 Units by mouth daily.       . clonazePAM (KLONOPIN) 0.5 MG tablet Take 1 tablet (0.5 mg total) by mouth 2 (two) times daily as needed for anxiety.  40 tablet  1  . fexofenadine (ALLEGRA) 180  MG tablet Take 180 mg by mouth as needed.       [name] FLUoxetine (PROZAC) 40 MG capsule Take 1 capsule (40 mg total) by mouth daily.  90 capsule  2  . fluticasone (VERAMYST) 27.5 MCG/SPRAY nasal spray Place 2 sprays into the nose daily.      . fluticasone-salmeterol (ADVAIR HFA) 115-21 MCG/ACT inhaler Inhale 2 puffs into the lungs 2 (two) times daily.  1 Inhaler  12  . HYDROcodone-acetaminophen (VICODIN) 5-500 MG per tablet Take 1 tablet by mouth every 8 (eight) hours as needed. For pain      . ipratropium (ATROVENT) 0.06 % nasal spray Place 2 sprays into the nose 4 (four) times daily.  15 mL  2  . meloxicam (MOBIC) 7.5 MG tablet Take 1 tablet (7.5 mg total) by mouth daily.  30 tablet  0  . metoprolol succinate (TOPROL-XL) 100 MG 24 hr tablet Take 100 mg by mouth daily.      . nitroGLYCERIN (NITROSTAT) 0.4 MG SL tablet Place 0.4 mg under the tongue every 5 (five) minutes as needed.      . rosuvastatin (CRESTOR) 20 MG tablet Take 1 tablet (20 mg total) by mouth daily.  90 tablet  2  . Simethicone (GAS-X PO) Take 1 tablet by mouth daily as needed. For gas         Review of Systems:  As per HPI- otherwise negative.   Physical Examination: Filed Vitals:   08/19/12 0825  BP: 128/76  Pulse: 104  Temp: 98.1 F (36.7 C)  Resp: 18   Filed Vitals:   08/19/12 0825  Height: 4\' 11"  (1.499 m)  Weight: 156 lb (70.761 kg)   Body mass index is 31.51 kg/(m^2). Ideal Body Weight: Weight in (lb) to have BMI = 25: 123.5   GEN: WDWN, NAD, Non-toxic, A & O x 3 HEENT: Atraumatic, Normocephalic. Neck supple. No masses, No LAD.  Bilateral TM wnl, oropharynx normal.  PEERL,EOMI.   Nasal congestion Ears and Nose: No external deformity. CV: RRR, No M/G/R. No JVD. No thrill. No extra heart sounds. PULM: CTA B, no wheezes, crackles, rhonchi. No retractions. No resp. distress. No accessory muscle use. ABD: S, NT, ND EXTR: No c/c/e NEURO Normal gait.  PSYCH: Normally interactive. Conversant. Not depressed or  anxious appearing.  Calm demeanor.   Results for orders placed in visit on 08/19/12  POCT INFLUENZA A/B  Component Value Range   Influenza A, POC Negative     Influenza B, POC Negative    POCT CBC      Component Value Range   WBC 5.4  4.6 - 10.2 K/uL   Lymph, poc 1.9  0.6 - 3.4   POC LYMPH PERCENT 35.5  10 - 50 %L   MID (cbc) 0.5  0 - 0.9   POC MID % 10.1  0 - 12 %M   POC Granulocyte 2.9  2 - 6.9   Granulocyte percent 54.4  37 - 80 %G   RBC 5.30  4.04 - 5.48 M/uL   Hemoglobin 13.5  12.2 - 16.2 g/dL   HCT, POC 95.6  21.3 - 47.9 %   MCV 80.5  80 - 97 fL   MCH, POC 25.5 (*) 27 - 31.2 pg   MCHC 31.6 (*) 31.8 - 35.4 g/dL   RDW, POC 08.6     Platelet Count, POC 249  142 - 424 K/uL   MPV 9.7  0 - 99.8 fL      Assessment and Plan: 1. Cough  POCT Influenza A/B, POCT CBC, cefdinir (OMNICEF) 300 MG capsule, HYDROcodone-homatropine (HYCODAN) 5-1.5 MG/5ML syrup  2. Malaise     Likely bronchitis, with cough.  Treat with hycodan and omnicef.  She will let me know if not better in the next couple of days- Sooner if worse.    Abbe Amsterdam, MD

## 2012-08-28 ENCOUNTER — Ambulatory Visit (INDEPENDENT_AMBULATORY_CARE_PROVIDER_SITE_OTHER): Payer: No Typology Code available for payment source | Admitting: Family Medicine

## 2012-08-28 VITALS — BP 158/82 | HR 100 | Temp 98.0°F | Resp 20 | Ht 60.0 in | Wt 158.6 lb

## 2012-08-28 DIAGNOSIS — R05 Cough: Secondary | ICD-10-CM

## 2012-08-28 DIAGNOSIS — J984 Other disorders of lung: Secondary | ICD-10-CM

## 2012-08-28 DIAGNOSIS — R0602 Shortness of breath: Secondary | ICD-10-CM

## 2012-08-28 DIAGNOSIS — R059 Cough, unspecified: Secondary | ICD-10-CM

## 2012-08-28 LAB — POCT CBC
Granulocyte percent: 58.9 %G (ref 37–80)
HCT, POC: 42 % (ref 37.7–47.9)
Hemoglobin: 13 g/dL (ref 12.2–16.2)
Lymph, poc: 2.9 (ref 0.6–3.4)
MCH, POC: 25.1 pg — AB (ref 27–31.2)
MCHC: 31 g/dL — AB (ref 31.8–35.4)
MCV: 81.3 fL (ref 80–97)
MID (cbc): 0.6 (ref 0–0.9)
MPV: 8.6 fL (ref 0–99.8)
POC Granulocyte: 5 (ref 2–6.9)
POC LYMPH PERCENT: 34.3 %L (ref 10–50)
POC MID %: 6.8 %M (ref 0–12)
Platelet Count, POC: 328 10*3/uL (ref 142–424)
RBC: 5.17 M/uL (ref 4.04–5.48)
RDW, POC: 14.4 %
WBC: 8.5 10*3/uL (ref 4.6–10.2)

## 2012-08-28 LAB — PULMONARY FUNCTION TEST

## 2012-08-28 LAB — D-DIMER, QUANTITATIVE: D-Dimer, Quant: 0.27 ug/mL-FEU (ref 0.00–0.48)

## 2012-08-28 MED ORDER — BENZONATATE 100 MG PO CAPS: 200.0000 mg | ORAL_CAPSULE | Freq: Three times a day (TID) | ORAL | Status: DC | PRN

## 2012-08-28 NOTE — Patient Instructions (Addendum)
You have an appt on Monday with [facility] ENT- Dr. Emeline Darling at 1:20 pm.  Arrive 15 minutes early.  Also, please give their office a call in the morning regarding your prior bill.    [facility] Main Office: [address] [place], [place] [address] [phone] phone   I will be in touch tonight with your D Dimer result, and will also refer you to a lung specialist to further evaluate your lung problem.  Please try the tessalon perles for your cough in the meantime

## 2012-08-28 NOTE — Progress Notes (Signed)
[facility] [address], [place] [address] [phone]  Date:  08/28/2012   Name:  Destiny Ballard   DOB:  1950-07-22   MRN:  [id number]  PCP:  Tonye Pearson, MD    Chief Complaint: Cough   History of Present Illness:  Destiny Ballard is a 62 y.o. very pleasant female patient who presents with the following:  Here today with a persistent cough.  She was seen here last on 08/19/12 and was started on omnicef for probable bronchitis. She is here because "I'm still coughing."  Her diarrhea is now better.  She notes that she threw up last week but this is also now resolved.   She is using using her albuterol inhaler, but is not taking her adviar. Albuterol helps her some, but does not totally resolve her symptoms.   She has been sneezing, but has not noted a fever.    She continues to be bothered by chronic tinnitus.  I have referred her to ENT but her appt has not been made yet.   She states that her cough is "all the time," made worse by talking or going outdoors in the cold.  Her job continues to be a major concern, as she is exposed to the elements which seems to exacerbate her symptoms  At her visit on the 5th she had a normal CBC and a normal CXR on 07/22/2012  She is not a smoker herself, but she has been exposed to 2nd hand smoke since childhood.  Her current husband is also a smoker.    (She was in clinic in January and was sent to the ED for a CP rule- out.  Her work up was negative and she had a negative D dimer at that time.)    Patient Active Problem List  Diagnosis  . HYPERLIPIDEMIA  . MORBID OBESITY  . ANEMIA-IRON DEFICIENCY  . HYPERTENSION  . CORONARY ARTERY DISEASE  . GERD  . HEADACHE, CHRONIC  . COUGH  . Chest pain  . Abscess of axilla  . Cellulitis of axilla, left    Past Medical History  Diagnosis Date  . Hypertension   . Hyperlipidemia   . Obesity   . Anxiety     Prior suicide attempt  . Depression   . Insulin  resistance   . Iron deficiency anemia   . GERD (gastroesophageal reflux disease)   . Cervical spondylosis   . CAD (coronary artery disease)     a) s/p DES to LAD 07/2005 b) Last Myoview low risk 11/2011 showing small fixed apical perfusion defect (prior MI vs attenuation) but no ischemia - normal EF.    Past Surgical History  Procedure Laterality Date  . Cardiac catheterization  06/17/2007    NORMAL. EF 60%  . Coronary stent placement  07/2005    LEFT ANTERIOR DESCENDING  . Tubal ligation    . Tonsillectomy and adenoidectomy    . Childbirth      X3  . Breast enhancement surgery    . Cervical spondylosis      SINGLE LEVEL FUSION  . Incision and drainage breast abscess  01/05/2012       . Forearm fracture surgery  2010    hand and shoulder     History  Substance Use Topics  . Smoking status: Never Smoker   . Smokeless tobacco: Never Used  . Alcohol Use: 0.6 oz/week    1 Glasses of wine per week  Comment: social    Family History  Problem Relation Age of Onset  . Heart attack Mother   . Diabetes Mother   . Cancer Mother   . Suicidality Father     Allergies  Allergen Reactions  . Prednisone     REACTION: mood swings, nightmares. "Shot doesn't bother me, reaction is just with the pill"    Medication list has been reviewed and updated.  Current Outpatient Prescriptions on File Prior to Visit  Medication Sig Dispense Refill  . acidophilus (RISAQUAD) CAPS Take 1 capsule by mouth daily as needed. For probiotic      . albuterol (PROVENTIL HFA;VENTOLIN HFA) 108 (90 BASE) MCG/ACT inhaler Inhale 1 puff into the lungs every 6 (six) hours as needed. For shortness of breath, chronic bronchitis      . amLODipine (NORVASC) 10 MG tablet Take 10 mg by mouth daily.      [name] aspirin EC 81 MG tablet Take 81 mg by mouth daily.      . cefdinir (OMNICEF) 300 MG capsule Take 1 capsule (300 mg total) by mouth 2 (two) times daily.  20 capsule  0  . Cholecalciferol (VITAMIN D3) 1000 UNITS  CAPS Take 1,000 Units by mouth daily.       . clonazePAM (KLONOPIN) 0.5 MG tablet Take 1 tablet (0.5 mg total) by mouth 2 (two) times daily as needed for anxiety.  40 tablet  1  . fexofenadine (ALLEGRA) 180 MG tablet Take 180 mg by mouth as needed.       [name] FLUoxetine (PROZAC) 40 MG capsule Take 1 capsule (40 mg total) by mouth daily.  90 capsule  2  . fluticasone (VERAMYST) 27.5 MCG/SPRAY nasal spray Place 2 sprays into the nose daily.      . fluticasone-salmeterol (ADVAIR HFA) 115-21 MCG/ACT inhaler Inhale 2 puffs into the lungs 2 (two) times daily.  1 Inhaler  12  . HYDROcodone-acetaminophen (VICODIN) 5-500 MG per tablet Take 1 tablet by mouth every 8 (eight) hours as needed. For pain      . HYDROcodone-homatropine (HYCODAN) 5-1.5 MG/5ML syrup Take 5 mLs by mouth every 8 (eight) hours as needed for cough.  90 mL  0  . ipratropium (ATROVENT) 0.06 % nasal spray Place 2 sprays into the nose 4 (four) times daily.  15 mL  2  . meloxicam (MOBIC) 7.5 MG tablet Take 1 tablet (7.5 mg total) by mouth daily.  30 tablet  0  . metoprolol succinate (TOPROL-XL) 100 MG 24 hr tablet Take 100 mg by mouth daily.      . nitroGLYCERIN (NITROSTAT) 0.4 MG SL tablet Place 0.4 mg under the tongue every 5 (five) minutes as needed.      . rosuvastatin (CRESTOR) 20 MG tablet Take 1 tablet (20 mg total) by mouth daily.  90 tablet  2  . Simethicone (GAS-X PO) Take 1 tablet by mouth daily as needed. For gas        No current facility-administered medications on file prior to visit.    Review of Systems:  As per HPI- otherwise negative. The cough is not usually productive  Physical Examination: Filed Vitals:   08/28/12 1512  BP: 178/73  Pulse: 108  Temp: 98 F (36.7 C)  Resp: 20   Filed Vitals:   08/28/12 1512  Height: 5' (1.524 m)  Weight: 158 lb 9.6 oz (71.94 kg)   Body mass index is 30.97 kg/(m^2). Ideal Body Weight: Weight in (lb) to have BMI = 25: 127.7  GEN: WDWN, NAD, Non-toxic, A & O x 3 HEENT:  Atraumatic, Normocephalic. Neck supple. No masses, No LAD.  Bilateral TM wnl, oropharynx normal.  PEERL,EOMI.   Ears and Nose: No external deformity. CV: RRR, No M/G/R. No JVD. No thrill. No extra heart sounds. PULM: CTA B, no wheezes, crackles, rhonchi. No retractions. No resp. distress. No accessory muscle use. ABD: S, NT, ND, +BS. No rebound. No HSM. EXTR: No c/c/e NEURO Normal gait.  PSYCH: Normally interactive. Conversant. Not depressed or anxious appearing.  Calm demeanor.   Results for orders placed in visit on 08/28/12  POCT CBC      Result Value Range   WBC 8.5  4.6 - 10.2 K/uL   Lymph, poc 2.9  0.6 - 3.4   POC LYMPH PERCENT 34.3  10 - 50 %L   MID (cbc) 0.6  0 - 0.9   POC MID % 6.8  0 - 12 %M   POC Granulocyte 5.0  2 - 6.9   Granulocyte percent 58.9  37 - 80 %G   RBC 5.17  4.04 - 5.48 M/uL   Hemoglobin 13.0  12.2 - 16.2 g/dL   HCT, POC 40.9  81.1 - 47.9 %   MCV 81.3  80 - 97 fL   MCH, POC 25.1 (*) 27 - 31.2 pg   MCHC 31.0 (*) 31.8 - 35.4 g/dL   RDW, POC 91.4     Platelet Count, POC 328  142 - 424 K/uL   MPV 8.6  0 - 99.8 fL    Her pulmonary function testing is consistent with a restrictive pattern.   Assessment and Plan: Cough - Plan: POCT CBC, benzonatate (TESSALON) 100 MG capsule  Restrictive lung disease - Plan: Ambulatory referral to Pulmonology  SOB (shortness of breath) - Plan: D-dimer, quantitative  [name] is here with persistent cough/ RAD symptoms.  However, her PFTs are consistent with a restrictive problem and she has not responded well to albuterol or to abx.  Check a D dimer today due to her mild tachycardia. Plan referral to pulmonology as an outpt assuming her D dimer is negative. Tessalon perles as needed for cough  Called around 9:30 pm- D dimer is negative.  Let her know.   Abbe Amsterdam, MD  [facility]- she apparently had an appt on 08/19/12 but it seems she did not know about this appt.   Made her a new appt for Monday at 1:20 pm  with Dr. Emeline Darling.  Arrive 15 minutes early.  Call in the am to discuss billing.

## 2012-09-01 ENCOUNTER — Institutional Professional Consult (permissible substitution): Payer: No Typology Code available for payment source | Admitting: Pulmonary Disease

## 2012-09-02 ENCOUNTER — Ambulatory Visit (INDEPENDENT_AMBULATORY_CARE_PROVIDER_SITE_OTHER): Payer: No Typology Code available for payment source | Admitting: Pulmonary Disease

## 2012-09-02 ENCOUNTER — Encounter: Payer: Self-pay | Admitting: Pulmonary Disease

## 2012-09-02 VITALS — BP 116/76 | HR 74 | Temp 97.6°F | Ht 60.0 in | Wt 158.8 lb

## 2012-09-02 DIAGNOSIS — R0600 Dyspnea, unspecified: Secondary | ICD-10-CM

## 2012-09-02 DIAGNOSIS — R0789 Other chest pain: Secondary | ICD-10-CM

## 2012-09-02 DIAGNOSIS — R0609 Other forms of dyspnea: Secondary | ICD-10-CM

## 2012-09-02 DIAGNOSIS — R05 Cough: Secondary | ICD-10-CM

## 2012-09-02 DIAGNOSIS — R059 Cough, unspecified: Secondary | ICD-10-CM

## 2012-09-02 HISTORY — DX: Dyspnea, unspecified: R06.00

## 2012-09-02 HISTORY — DX: Other forms of dyspnea: R06.09

## 2012-09-02 NOTE — Assessment & Plan Note (Signed)
The patient is complaining of worsening dyspnea on exertion since the start of winter.  She has a clear chest x-ray from January, and spirometry done in her primary care office showed a normal FEV1 percent.  It is unclear whether she has a pulmonary issue or not, and would also consider whether this is simply due to her obesity with deconditioning or possibly worsening coronary disease.  She will need to have full pulmonary function studies for evaluation.

## 2012-09-02 NOTE — Assessment & Plan Note (Signed)
The patient has a chronic cough that sounds upper airway in origin, and very similar to past complaints.  She does have postnasal drip, and I would like to try her on a more aggressive antihistamine regimen.  She is to stay on her nasal corticosteroid.  Her cough may also be due to reflux disease.

## 2012-09-02 NOTE — Patient Instructions (Addendum)
Will start on omeprazole 40mg  once each am for possible reflux causing your chest discomfort. Instead of allegra, take chlorpheniramine 4mg  otc and take 2 at bedtime and one at lunch each day until you see Dr. Delton Coombes Will schedule for breathing studies, and you will see Dr. Delton Coombes to discuss on the same day.

## 2012-09-02 NOTE — Progress Notes (Signed)
  Subjective:    Patient ID: Destiny Ballard, female    DOB: 1950/10/04, 62 y.o.   MRN: [id number]  HPI Patient comes in today for an acute sick visit.  She has been seen by Dr. Delton Coombes in the past for similar symptoms as today, but never returned for workup.  She is complaining of worsening dyspnea on exertion as well as chest pain since the beginning of winter.  She is blaming it on the cold weather.  She describes a chest tightness that occurs primarily with any exertional activity, but can sometimes happen at rest.  This is also associated with shortness of breath.  She has tried using albuterol for rescue, and it does help at times, but not consistently.  She hasn't really tried nitroglycerin.  She has a history of coronary disease, but had a low risk nuclear stress in May of 2013.  She also has a persistent cough that is primarily dry, and this was one of her complaints from last visit here.  She does have postnasal drip for which she is on Allegra and a steroid nasal spray, and does admit to having intermittent reflux symptoms.  She has had a recent chest x-ray that was totally clear, and has had spirometry that appears to have a normal FEV1 percent.  She will obviously need full pulmonary function studies.   Review of Systems  Constitutional: Negative for fever and unexpected weight change.  HENT: Positive for sneezing. Negative for ear pain, nosebleeds, congestion, sore throat, rhinorrhea, trouble swallowing, dental problem, postnasal drip and sinus pressure.   Eyes: Negative for redness and itching.  Respiratory: Positive for shortness of breath. Negative for cough, chest tightness and wheezing.   Cardiovascular: Positive for chest pain. Negative for palpitations and leg swelling.  Gastrointestinal: Negative for nausea and vomiting.  Genitourinary: Negative for dysuria.  Musculoskeletal: Negative for joint swelling.  Skin: Negative for rash.  Neurological: Positive for headaches.   Hematological: Does not bruise/bleed easily.  Psychiatric/Behavioral: Positive for dysphoric mood. The patient is not nervous/anxious.        Objective:   Physical Exam Obese female in no acute distress Nose without purulence or discharge noted Oropharynx clear Neck without lymphadenopathy or thyromegaly Chest with fairly clear breath sounds, no active wheezing or rhonchi Cardiac exam with regular rate and rhythm, 2/6 systolic murmur Lower extremities with minimal edema, no cyanosis Alert and oriented, moves all 4 extremities.       Assessment & Plan:

## 2012-09-02 NOTE — Addendum Note (Signed)
Addended by: Orma Flaming D on: 09/02/2012 02:12 PM   Modules accepted: Orders

## 2012-09-02 NOTE — Assessment & Plan Note (Signed)
The patient describes chest tightness with exertional activity that resolves with rest.  This is only occasionally responsive to her albuterol rescue.  She really has not tried nitroglycerin.  If her PFTs are unremarkable, she probably needs further cardiac evaluation.  I would like to start her on a medication for possible reflux, since this can mimic her current symptomatology.

## 2012-09-07 ENCOUNTER — Ambulatory Visit (INDEPENDENT_AMBULATORY_CARE_PROVIDER_SITE_OTHER): Payer: No Typology Code available for payment source | Admitting: Emergency Medicine

## 2012-09-07 ENCOUNTER — Telehealth: Payer: Self-pay | Admitting: Emergency Medicine

## 2012-09-07 ENCOUNTER — Ambulatory Visit (HOSPITAL_COMMUNITY)
Admission: RE | Admit: 2012-09-07 | Discharge: 2012-09-07 | Disposition: A | Discharge status: Home / Self Care | Payer: No Typology Code available for payment source | Source: Ambulatory Visit | Attending: Emergency Medicine | Admitting: Emergency Medicine

## 2012-09-07 ENCOUNTER — Encounter: Payer: Self-pay | Admitting: Emergency Medicine

## 2012-09-07 VITALS — BP 120/80 | HR 77 | Ht 60.0 in | Wt 158.0 lb

## 2012-09-07 DIAGNOSIS — R51 Headache: Secondary | ICD-10-CM | POA: Insufficient documentation

## 2012-09-07 DIAGNOSIS — R059 Cough, unspecified: Secondary | ICD-10-CM | POA: Insufficient documentation

## 2012-09-07 DIAGNOSIS — R05 Cough: Secondary | ICD-10-CM | POA: Insufficient documentation

## 2012-09-07 DIAGNOSIS — R0989 Other specified symptoms and signs involving the circulatory and respiratory systems: Secondary | ICD-10-CM | POA: Insufficient documentation

## 2012-09-07 DIAGNOSIS — F3289 Other specified depressive episodes: Secondary | ICD-10-CM | POA: Insufficient documentation

## 2012-09-07 DIAGNOSIS — R0609 Other forms of dyspnea: Secondary | ICD-10-CM | POA: Insufficient documentation

## 2012-09-07 DIAGNOSIS — Z79899 Other long term (current) drug therapy: Secondary | ICD-10-CM | POA: Insufficient documentation

## 2012-09-07 DIAGNOSIS — K219 Gastro-esophageal reflux disease without esophagitis: Secondary | ICD-10-CM | POA: Insufficient documentation

## 2012-09-07 DIAGNOSIS — F329 Major depressive disorder, single episode, unspecified: Secondary | ICD-10-CM | POA: Insufficient documentation

## 2012-09-07 DIAGNOSIS — I10 Essential (primary) hypertension: Secondary | ICD-10-CM | POA: Insufficient documentation

## 2012-09-07 DIAGNOSIS — R0789 Other chest pain: Secondary | ICD-10-CM | POA: Insufficient documentation

## 2012-09-07 DIAGNOSIS — R0600 Dyspnea, unspecified: Secondary | ICD-10-CM

## 2012-09-07 DIAGNOSIS — J310 Chronic rhinitis: Secondary | ICD-10-CM | POA: Insufficient documentation

## 2012-09-07 DIAGNOSIS — I2510 Atherosclerotic heart disease of native coronary artery without angina pectoris: Secondary | ICD-10-CM | POA: Insufficient documentation

## 2012-09-07 MED ORDER — OMEPRAZOLE 40 MG PO CPDR: 40.0000 mg | DELAYED_RELEASE_CAPSULE | Freq: Every day | ORAL | Status: DC

## 2012-09-07 MED ORDER — ALBUTEROL SULFATE (5 MG/ML) 0.5% IN NEBU
2.5000 mg | INHALATION_SOLUTION | Freq: Once | RESPIRATORY_TRACT | Status: AC
  Administered 2012-09-07: 2.5 mg via RESPIRATORY_TRACT

## 2012-09-07 NOTE — Telephone Encounter (Signed)
Spoke with patient, patient seen today in office, supposed to have omeprazole 40mg  sent in to [facility].  Rx has now been sent, patient aware and nothing further needed at this time.

## 2012-09-07 NOTE — Progress Notes (Signed)
  HPI: 62 yo never smoker, hx CAD s/p PTCI, HTN, GERD, HA's, depression. Has been seen here for dyspnea and cough in the past by DS. She knows that she has some allergies that impact her cough, ? dogs, ? second-hand smoke. Last seen by me in 2011. She was seen by Dr Shelle Iron 08/2012 for chest discomfort and dyspnea, tightness. She had low-risk stress test 11/2011. Also with chronic cough, chronic rhinitis, GERD. 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 Stopped allegra and started chlorpheniramine, is using veramyst prn. She has been requiring tessalon perles frequently.   Filed Vitals:   09/07/12 1535  BP: 120/80  Pulse: 77   Gen: Pleasant, overwt, in no distress,  normal affect  ENT: No lesions,  mouth clear,  oropharynx clear, no postnasal drip  Neck: No JVD, no TMG, no carotid bruits  Lungs: No use of accessory muscles, no dullness to percussion, clear without rales or rhonchi  Cardiovascular: RRR, heart sounds normal, no murmur or gallops, no peripheral edema  Musculoskeletal: No deformities, no cyanosis or clubbing  Neuro: alert, non focal  Skin: Warm, no lesions or rashes   PULMONARY FUNCTON TEST 03/23/2012 08/28/2012 09/07/2012  Peak Flow 200    FVC  1.77 2.74  FEV1  1.32 2.1  FEV1/FVC  74.6 76.6  FVC  % Predicted   62  FEV % Predicted   64  FeF 25-75   1.98  FeF 25-75 % Predicted   1.26   GERD Never received the script for omeprazole 40, will re-send today  COUGH Large PND influence. Minimal to no AFL on spirometry Continue chlorpheniramine, restart allegra Schedule veramyst every day Albuterol prn rov 1 month

## 2012-09-07 NOTE — Patient Instructions (Addendum)
Please start omeprazole 40mg  daily Continue your chlorpheniramine at bedtime Restart allegra daily Start using your veramyst 2 sprays each nostril daily, every day Use albuterol 2 puffs as needed for shortness of breath Follow with Dr Delton Coombes in 1 month

## 2012-09-07 NOTE — Assessment & Plan Note (Signed)
Large PND influence. Minimal to no AFL on spirometry Continue chlorpheniramine, restart allegra Schedule veramyst every day Albuterol prn rov 1 month

## 2012-09-07 NOTE — Assessment & Plan Note (Signed)
Never received the script for omeprazole 40, will re-send today

## 2012-10-03 ENCOUNTER — Other Ambulatory Visit: Payer: Self-pay | Admitting: Cardiology

## 2012-10-06 ENCOUNTER — Ambulatory Visit (INDEPENDENT_AMBULATORY_CARE_PROVIDER_SITE_OTHER): Payer: No Typology Code available for payment source | Admitting: Emergency Medicine

## 2012-10-06 ENCOUNTER — Encounter: Payer: Self-pay | Admitting: *Deleted

## 2012-10-06 ENCOUNTER — Encounter: Payer: Self-pay | Admitting: Cardiology

## 2012-10-06 ENCOUNTER — Encounter: Payer: Self-pay | Admitting: Emergency Medicine

## 2012-10-06 VITALS — BP 158/80 | HR 118 | Temp 98.1°F | Ht 60.0 in | Wt 160.4 lb

## 2012-10-06 DIAGNOSIS — R05 Cough: Secondary | ICD-10-CM

## 2012-10-06 DIAGNOSIS — K5289 Other specified noninfective gastroenteritis and colitis: Secondary | ICD-10-CM

## 2012-10-06 DIAGNOSIS — R059 Cough, unspecified: Secondary | ICD-10-CM

## 2012-10-06 MED ORDER — PROMETHAZINE HCL 12.5 MG PO TABS: 12.5000 mg | ORAL_TABLET | Freq: Four times a day (QID) | ORAL | Status: DC | PRN

## 2012-10-06 NOTE — Patient Instructions (Addendum)
Please continue your allegra and chlorpheniramine.  Continue omeprazole 40mg  daily You will probably need to start taking your veramyst nasal spray every day this Spring when allergies increase.  Follow with Dr Delton Coombes in 6 months or sooner if you have any problems

## 2012-10-06 NOTE — Assessment & Plan Note (Signed)
Please continue your allegra and chlorpheniramine.  Continue omeprazole 40mg daily You will probably need to start taking your veramyst nasal spray every day this Spring when allergies increase.  Follow with Dr Cailen Texeira in 6 months or sooner if you have any problems  

## 2012-10-06 NOTE — Progress Notes (Signed)
  HPI: 62 yo never smoker, hx CAD s/p PTCI, HTN, GERD, HA's, depression. Has been seen here for dyspnea and cough in the past by DS. She knows that she has some allergies that impact her cough, ? dogs, ? second-hand smoke. Last seen by me in 2011. She was seen by Dr Shelle Iron 08/2012 for chest discomfort and dyspnea, tightness. She had low-risk stress test 11/2011. Also with chronic cough, chronic rhinitis, GERD. 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 Stopped allegra and started chlorpheniramine, is using veramyst prn. She has been requiring tessalon perles frequently.   ROV 10/06/12 -- follows up for cough, probably mixed disease on spirometry (mild). Last time restarted allegra, continued chlorpheniramine, scheduled veramyst. Also started omeprazole. She is better - her cough is improved. She is still using the veramyst prn. She is complaining of bloating, abd pain, emesis x 3 days, diarrhea.   Filed Vitals:   10/06/12 1117  BP: 158/80  Pulse: 118  Temp: 98.1 F (36.7 C)   Gen: Pleasant, overwt, in no distress,  normal affect  ENT: No lesions,  mouth clear,  oropharynx clear, no postnasal drip  Neck: No JVD, no TMG, no carotid bruits  Lungs: No use of accessory muscles, no dullness to percussion, clear without rales or rhonchi  Cardiovascular: RRR, heart sounds normal, no murmur or gallops, no peripheral edema  Musculoskeletal: No deformities, no cyanosis or clubbing  Neuro: alert, non focal  Skin: Warm, no lesions or rashes   PULMONARY FUNCTON TEST 03/23/2012 08/28/2012 09/07/2012  Peak Flow 200    FVC  1.77 2.74  FEV1  1.32 2.1  FEV1/FVC  74.6 76.6  FVC  % Predicted   62  FEV % Predicted   64  FeF 25-75   1.98  FeF 25-75 % Predicted   1.26   COUGH Please continue your allegra and chlorpheniramine.  Continue omeprazole 40mg  daily You will probably need to start  taking your veramyst nasal spray every day this Spring when allergies increase.  Follow with Dr Delton Coombes in 6 months or sooner if you have any problems

## 2012-10-10 ENCOUNTER — Other Ambulatory Visit: Payer: Self-pay | Admitting: Family Medicine

## 2012-10-14 ENCOUNTER — Telehealth: Payer: Self-pay

## 2012-10-14 DIAGNOSIS — R059 Cough, unspecified: Secondary | ICD-10-CM

## 2012-10-14 DIAGNOSIS — R05 Cough: Secondary | ICD-10-CM

## 2012-10-14 MED ORDER — BENZONATATE 100 MG PO CAPS: 200.0000 mg | ORAL_CAPSULE | Freq: Three times a day (TID) | ORAL | Status: DC | PRN

## 2012-10-14 NOTE — Telephone Encounter (Signed)
Patients tessalon pearls were denied but I am not sure why, am sending directly to you, can we renew these?

## 2012-10-14 NOTE — Telephone Encounter (Signed)
PATIENT LAST SAW DR ZOXWRUE AND WAS PRESCRIBED BENZONATATE FOR A COUGH. PATIENT CALLED HER PHARMACY FOR A REFILL AND IT WAS DENIED. PHARMACY CALLED OUR OFFICE AND AUTHORIZATION WAS NOT GIVEN FOR A REFILL. PATIENT USES [facility] ON [address].  [phone]

## 2012-12-26 ENCOUNTER — Other Ambulatory Visit: Payer: Self-pay | Admitting: Family Medicine

## 2013-01-05 ENCOUNTER — Other Ambulatory Visit: Payer: Self-pay | Admitting: Physician Assistant

## 2013-02-10 ENCOUNTER — Ambulatory Visit: Payer: No Typology Code available for payment source

## 2013-02-10 ENCOUNTER — Ambulatory Visit (INDEPENDENT_AMBULATORY_CARE_PROVIDER_SITE_OTHER): Payer: No Typology Code available for payment source | Admitting: Family Medicine

## 2013-02-10 VITALS — BP 120/75 | HR 60 | Temp 98.0°F | Resp 17 | Ht 60.0 in | Wt 176.0 lb

## 2013-02-10 DIAGNOSIS — M549 Dorsalgia, unspecified: Secondary | ICD-10-CM

## 2013-02-10 DIAGNOSIS — R059 Cough, unspecified: Secondary | ICD-10-CM

## 2013-02-10 DIAGNOSIS — R05 Cough: Secondary | ICD-10-CM

## 2013-02-10 DIAGNOSIS — K219 Gastro-esophageal reflux disease without esophagitis: Secondary | ICD-10-CM

## 2013-02-10 DIAGNOSIS — F32A Depression, unspecified: Secondary | ICD-10-CM

## 2013-02-10 DIAGNOSIS — F329 Major depressive disorder, single episode, unspecified: Secondary | ICD-10-CM

## 2013-02-10 DIAGNOSIS — R0602 Shortness of breath: Secondary | ICD-10-CM

## 2013-02-10 LAB — BASIC METABOLIC PANEL
BUN: 22 mg/dL (ref 6–23)
CO2: 28 mEq/L (ref 19–32)
Calcium: 9.5 mg/dL (ref 8.4–10.5)
Chloride: 100 mEq/L (ref 96–112)
Creat: 0.72 mg/dL (ref 0.50–1.10)
Glucose, Bld: 115 mg/dL — ABNORMAL HIGH (ref 70–99)
Potassium: 4.1 mEq/L (ref 3.5–5.3)
Sodium: 139 mEq/L (ref 135–145)

## 2013-02-10 LAB — POCT CBC
Granulocyte percent: 64 %G (ref 37–80)
HCT, POC: 40.5 % (ref 37.7–47.9)
Hemoglobin: 12.6 g/dL (ref 12.2–16.2)
Lymph, poc: 2.4 (ref 0.6–3.4)
MCH, POC: 25.7 pg — AB (ref 27–31.2)
MCHC: 31.1 g/dL — AB (ref 31.8–35.4)
MCV: 82.5 fL (ref 80–97)
MID (cbc): 0.6 (ref 0–0.9)
MPV: 9.1 fL (ref 0–99.8)
POC Granulocyte: 5.4 (ref 2–6.9)
POC LYMPH PERCENT: 28.4 %L (ref 10–50)
POC MID %: 7.6 %M (ref 0–12)
Platelet Count, POC: 251 10*3/uL (ref 142–424)
RBC: 4.91 M/uL (ref 4.04–5.48)
RDW, POC: 14.8 %
WBC: 8.5 10*3/uL (ref 4.6–10.2)

## 2013-02-10 MED ORDER — METHOCARBAMOL 500 MG PO TABS: 500.0000 mg | ORAL_TABLET | Freq: Three times a day (TID) | ORAL | Status: DC

## 2013-02-10 MED ORDER — BENZONATATE 100 MG PO CAPS: ORAL_CAPSULE | ORAL | Status: DC

## 2013-02-10 MED ORDER — FLUOXETINE HCL 40 MG PO CAPS: 40.0000 mg | ORAL_CAPSULE | Freq: Every day | ORAL | Status: DC

## 2013-02-10 MED ORDER — OMEPRAZOLE 40 MG PO CPDR: 40.0000 mg | DELAYED_RELEASE_CAPSULE | Freq: Every day | ORAL | Status: DC

## 2013-02-10 NOTE — Progress Notes (Addendum)
[facility] [address] [place] [address] [phone]  Date:  02/10/2013   Name:  Destiny Ballard   DOB:  1951/06/28   MRN:  [id number]  PCP:  Tonye Pearson, MD    Chief Complaint: Back Pain and swelling in feet   History of Present Illness:  Destiny Ballard is a 62 y.o. very pleasant female patient who presents with the following:  She is here today LE edema- this started last month.  It is better in the morning or when she elevates her legs.   She also noted difficulty when she lies down at night for the last couple of weeks.  She has to prop up with pillows or sleep in a chair. She is not sure if this is due to her breathing or to the pain in her back.  She does not feel SOB  "I keep a cough," this is not new to her.  Cough is dry.  No fever that she has noted.  She does note some nasal congestion She notes a "pressure" in her left back for a couple of days- worse if she puts any presure on it like if she lies on the area.  No known injury.    No history of DVT or PE.  Positive history of CAD.  No abdominal complaints   She has now retired from the [employer].  She feels this is a positive change in her life.    Patient Active Problem List   Diagnosis Date Noted  . Other and unspecified noninfectious gastroenteritis and colitis 10/06/2012  . Dyspnea on exertion 09/02/2012  . Chest discomfort 09/02/2012  . Abscess of axilla 01/05/2012  . Cellulitis of axilla, left 01/05/2012  . Chest pain 12/10/2011  . HYPERLIPIDEMIA 05/11/2010  . MORBID OBESITY 05/11/2010  . HYPERTENSION 05/11/2010  . HEADACHE, CHRONIC 05/11/2010  . COUGH 05/11/2010  . ANEMIA-IRON DEFICIENCY 06/28/2008  . CORONARY ARTERY DISEASE 06/28/2008  . GERD 06/28/2008    Past Medical History  Diagnosis Date  . Hypertension   . Hyperlipidemia   . Obesity   . Anxiety     Prior suicide attempt  . Depression   . Insulin resistance   . Iron deficiency anemia   . GERD  (gastroesophageal reflux disease)   . Cervical spondylosis   . CAD (coronary artery disease)     a) s/p DES to LAD 07/2005 b) Last Myoview low risk 11/2011 showing small fixed apical perfusion defect (prior MI vs attenuation) but no ischemia - normal EF.    Past Surgical History  Procedure Laterality Date  . Cardiac catheterization  06/17/2007    NORMAL. EF 60%  . Coronary stent placement  07/2005    LEFT ANTERIOR DESCENDING  . Tubal ligation    . Tonsillectomy and adenoidectomy    . Childbirth      X3  . Breast enhancement surgery    . Cervical spondylosis      SINGLE LEVEL FUSION  . Incision and drainage breast abscess  01/05/2012       . Forearm fracture surgery  2010    hand and shoulder     History  Substance Use Topics  . Smoking status: Never Smoker   . Smokeless tobacco: Never Used  . Alcohol Use: 0.6 oz/week    1 Glasses of wine per week     Comment: social    Family History  Problem Relation Age of Onset  . Heart  attack Mother   . Diabetes Mother   . Lung cancer Mother   . Suicidality Father   . Asthma Daughter     x2  . Allergies Other     all family--seasonal allergies  . Asthma Mother     Allergies  Allergen Reactions  . Prednisone     REACTION: mood swings, nightmares. "Shot doesn't bother me, reaction is just with the pill"    Medication list has been reviewed and updated.  Current Outpatient Prescriptions on File Prior to Visit  Medication Sig Dispense Refill  . albuterol (PROVENTIL HFA;VENTOLIN HFA) 108 (90 BASE) MCG/ACT inhaler Inhale 1 puff into the lungs every 6 (six) hours as needed. For shortness of breath, chronic bronchitis      . amLODipine (NORVASC) 10 MG tablet Take 10 mg by mouth daily.      [name] aspirin EC 81 MG tablet Take 81 mg by mouth daily.      . benzonatate (TESSALON) 100 MG capsule TAKE 2 CAPSULES BY MOUTH 3 TIMES A DAY AS NEEDED FOR COUGH  60 capsule  0  . chlorpheniramine (CHLOR-TRIMETON) 4 MG tablet Take 4 mg by mouth 2  (two) times daily as needed for allergies.      . Cholecalciferol (VITAMIN D3) 1000 UNITS CAPS Take 1,000 Units by mouth daily.       [name] FLUoxetine (PROZAC) 40 MG capsule Take 1 capsule (40 mg total) by mouth daily.  90 capsule  2  . fluticasone (VERAMYST) 27.5 MCG/SPRAY nasal spray Place 2 sprays into the nose daily.      . meloxicam (MOBIC) 15 MG tablet Take 15 mg by mouth daily.      . methocarbamol (ROBAXIN) 500 MG tablet Take 500 mg by mouth 4 (four) times daily.      . metoprolol succinate (TOPROL-XL) 100 MG 24 hr tablet TAKE 1 TABLET BY MOUTH EVERY DAY  90 tablet  0  . omeprazole (PRILOSEC) 40 MG capsule Take 1 capsule (40 mg total) by mouth daily.  30 capsule  3  . rosuvastatin (CRESTOR) 20 MG tablet Take 1 tablet (20 mg total) by mouth daily.  90 tablet  2  . nitroGLYCERIN (NITROSTAT) 0.4 MG SL tablet Place 0.4 mg under the tongue every 5 (five) minutes as needed.      . promethazine (PHENERGAN) 12.5 MG tablet Take 1 tablet (12.5 mg total) by mouth every 6 (six) hours as needed for nausea.  5 tablet  0   No current facility-administered medications on file prior to visit.    Review of Systems:  As per HPI- otherwise negative.   Physical Examination: Filed Vitals:   02/10/13 1237  BP: 142/68  Pulse: 75  Temp: 98 F (36.7 C)  Resp: 17   Filed Vitals:   02/10/13 1237  Height: 5' (1.524 m)  Weight: 176 lb (79.833 kg)   Body mass index is 34.37 kg/(m^2). Ideal Body Weight: Weight in (lb) to have BMI = 25: 127.7  Rechecked O2 sat on a toe- her fingernails are painted in dark polish which may have affected her results. 98%zxddxz GEN: WDWN, NAD, Non-toxic, A & O x 3, looks well, overweight HEENT: Atraumatic, Normocephalic. Neck supple. No masses, No LAD.  Bilateral TM wnl, oropharynx normal.  PEERL,EOMI.   Ears and Nose: No external deformity. CV: RRR, No M/G/R. No JVD. No thrill. No extra heart sounds. PULM: CTA B, no wheezes, crackles, rhonchi. No retractions. No resp.  distress. No accessory muscle use. ABD:  S, NT, ND, +BS. No rebound. No HSM. EXTR: No c/c.  At this time she does not have any LE edema  NEURO Normal gait.  PSYCH: Normally interactive. Conversant. Not depressed or anxious appearing.  Calm demeanor.  Reproducible pain when I press on her left mid- back. No lesion or redness of the area, no swelling   [facility] reading (PRIMARY) by  Dr. Patsy Lager. CXR: breast implants.  Otherwise stable, no pleural effusion noted CHEST - 2 VIEW  Comparison: 07/22/2012 and prior chest radiographs dating back to 10/19/2003  Findings: Mild cardiomegaly is again identified. Mild scarring at the lung bases is unchanged. There is no evidence of focal airspace disease, pulmonary edema, suspicious pulmonary nodule/mass, pleural effusion, or pneumothorax. No acute bony abnormalities are identified. Bilateral breast prosthesis and cervical surgical changes are again noted. Remote bilateral rib fractures are identified.  IMPRESSION: No evidence of acute cardiopulmonary disease.  Clinically significant discrepancy from primary report, if provided: None   Original Report Authenticated By: Harmon Pier, M.D.        EKG:  NSR, compared to old EKG no change is noted  Results for orders placed in visit on 02/10/13  POCT CBC      Result Value Range   WBC 8.5  4.6 - 10.2 K/uL   Lymph, poc 2.4  0.6 - 3.4   POC LYMPH PERCENT 28.4  10 - 50 %L   MID (cbc) 0.6  0 - 0.9   POC MID % 7.6  0 - 12 %M   POC Granulocyte 5.4  2 - 6.9   Granulocyte percent 64.0  37 - 80 %G   RBC 4.91  4.04 - 5.48 M/uL   Hemoglobin 12.6  12.2 - 16.2 g/dL   HCT, POC 16.1  09.6 - 47.9 %   MCV 82.5  80 - 97 fL   MCH, POC 25.7 (*) 27 - 31.2 pg   MCHC 31.1 (*) 31.8 - 35.4 g/dL   RDW, POC 04.5     Platelet Count, POC 251  142 - 424 K/uL   MPV 9.1  0 - 99.8 fL    Assessment and Plan: Shortness of breath - Plan: Brain natriuretic peptide  Back pain - Plan: POCT CBC, Basic metabolic panel,  DG Chest 2 View, EKG 12-Lead, methocarbamol (ROBAXIN) 500 MG tablet  Depression - Plan: FLUoxetine (PROZAC) 40 MG capsule  Cough - Plan: benzonatate (TESSALON) 100 MG capsule  GERD (gastroesophageal reflux disease) - Plan: omeprazole (PRILOSEC) 40 MG capsule  Discussed in detail with [name].  Possible CHF- however she currently has no edema and her CXR is negative.  Suspect that her pain is MSK in origin, and that she has venous insufficieny.  Await BNP.  Offered to perform a D dimer or have her seen in the ER for a cardiac rule- out.  She declines these measures at this time.  Will use robaxin as needed for pain. Refilled her prozac, tessalon and prilosec today as well.  Await the rest of her labs.  If she feels worse, is not better or has any other problems she is to call, RTC or otherwise seek care.  Avoid combining her robaxin with her chlor- trimeton  Signed Abbe Amsterdam, MD  8/2- called to check on her.  She is doing well.  Let her know BNP was negative

## 2013-02-11 LAB — BRAIN NATRIURETIC PEPTIDE: Brain Natriuretic Peptide: 5.7 pg/mL (ref 0.0–100.0)

## 2013-02-13 ENCOUNTER — Encounter: Payer: Self-pay | Admitting: Family Medicine

## 2013-03-09 ENCOUNTER — Other Ambulatory Visit: Payer: Self-pay

## 2013-03-09 ENCOUNTER — Telehealth: Payer: Self-pay

## 2013-03-09 DIAGNOSIS — K219 Gastro-esophageal reflux disease without esophagitis: Secondary | ICD-10-CM

## 2013-03-09 DIAGNOSIS — R059 Cough, unspecified: Secondary | ICD-10-CM

## 2013-03-09 DIAGNOSIS — R05 Cough: Secondary | ICD-10-CM

## 2013-03-09 MED ORDER — OMEPRAZOLE 40 MG PO CPDR: 40.0000 mg | DELAYED_RELEASE_CAPSULE | Freq: Every day | ORAL | Status: DC

## 2013-03-09 MED ORDER — BENZONATATE 100 MG PO CAPS: ORAL_CAPSULE | ORAL | Status: DC

## 2013-03-09 NOTE — Telephone Encounter (Signed)
Dr Patsy Lager, we received a req for Rx for tessalon to be sent to [facility] in 90 day supply. You had written for #90 w/1 RF to local pharmacy at [place]. Do you want her to have a 3 mos supply? Please also check sig and # given. Sig is for 2 tabs TID but you had only sent in #90.

## 2013-03-17 ENCOUNTER — Telehealth: Payer: Self-pay | Admitting: Emergency Medicine

## 2013-03-17 NOTE — Telephone Encounter (Signed)
left messages for pt to call back to schedule follow up apt. No return calls back. Sent letter 03/17/13 ° °

## 2013-05-10 ENCOUNTER — Encounter: Payer: Self-pay | Admitting: Nurse Practitioner

## 2013-05-10 ENCOUNTER — Encounter (INDEPENDENT_AMBULATORY_CARE_PROVIDER_SITE_OTHER): Payer: Self-pay

## 2013-05-10 ENCOUNTER — Ambulatory Visit (INDEPENDENT_AMBULATORY_CARE_PROVIDER_SITE_OTHER): Payer: No Typology Code available for payment source | Admitting: Nurse Practitioner

## 2013-05-10 VITALS — BP 148/78 | HR 62 | Ht 60.0 in | Wt 179.1 lb

## 2013-05-10 DIAGNOSIS — I2510 Atherosclerotic heart disease of native coronary artery without angina pectoris: Secondary | ICD-10-CM

## 2013-05-10 DIAGNOSIS — Z23 Encounter for immunization: Secondary | ICD-10-CM

## 2013-05-10 DIAGNOSIS — Z0000 Encounter for general adult medical examination without abnormal findings: Secondary | ICD-10-CM

## 2013-05-10 LAB — CBC WITH DIFFERENTIAL/PLATELET
Basophils Absolute: 0 10*3/uL (ref 0.0–0.1)
Basophils Relative: 0.2 % (ref 0.0–3.0)
Eosinophils Absolute: 0.2 10*3/uL (ref 0.0–0.7)
Eosinophils Relative: 2.6 % (ref 0.0–5.0)
HCT: 38.3 % (ref 36.0–46.0)
Hemoglobin: 12.6 g/dL (ref 12.0–15.0)
Lymphocytes Relative: 31.2 % (ref 12.0–46.0)
Lymphs Abs: 3 10*3/uL (ref 0.7–4.0)
MCHC: 32.9 g/dL (ref 30.0–36.0)
MCV: 76.7 fl — ABNORMAL LOW (ref 78.0–100.0)
Monocytes Absolute: 0.8 10*3/uL (ref 0.1–1.0)
Monocytes Relative: 8.3 % (ref 3.0–12.0)
Neutro Abs: 5.5 10*3/uL (ref 1.4–7.7)
Neutrophils Relative %: 57.7 % (ref 43.0–77.0)
Platelets: 274 10*3/uL (ref 150.0–400.0)
RBC: 4.99 Mil/uL (ref 3.87–5.11)
RDW: 14.1 % (ref 11.5–14.6)
WBC: 9.6 10*3/uL (ref 4.5–10.5)

## 2013-05-10 LAB — HEPATIC FUNCTION PANEL
ALT: 30 U/L (ref 0–35)
AST: 25 U/L (ref 0–37)
Albumin: 4 g/dL (ref 3.5–5.2)
Alkaline Phosphatase: 83 U/L (ref 39–117)
Bilirubin, Direct: 0 mg/dL (ref 0.0–0.3)
Total Bilirubin: 0.4 mg/dL (ref 0.3–1.2)
Total Protein: 7.8 g/dL (ref 6.0–8.3)

## 2013-05-10 LAB — BASIC METABOLIC PANEL
BUN: 14 mg/dL (ref 6–23)
CO2: 30 mEq/L (ref 19–32)
Calcium: 9.4 mg/dL (ref 8.4–10.5)
Chloride: 98 mEq/L (ref 96–112)
Creatinine, Ser: 0.6 mg/dL (ref 0.4–1.2)
GFR: 103.01 mL/min (ref >60.00)
Glucose, Bld: 97 mg/dL (ref 70–99)
Potassium: 3.8 mEq/L (ref 3.5–5.1)
Sodium: 138 mEq/L (ref 135–145)

## 2013-05-10 LAB — BRAIN NATRIURETIC PEPTIDE: Pro B Natriuretic peptide (BNP): 15 pg/mL (ref 0.0–100.0)

## 2013-05-10 LAB — LIPID PANEL
Cholesterol: 189 mg/dL (ref 0–200)
HDL: 54.4 mg/dL (ref >39.00)
Total CHOL/HDL Ratio: 3
Triglycerides: 234 mg/dL — ABNORMAL HIGH (ref 0.0–149.0)
VLDL: 46.8 mg/dL — ABNORMAL HIGH (ref 0.0–40.0)

## 2013-05-10 LAB — LDL CHOLESTEROL, DIRECT: Direct LDL: 111 mg/dL

## 2013-05-10 NOTE — Addendum Note (Signed)
Addended by: Kem Parkinson on: 05/10/2013 02:57 PM   Modules accepted: Orders

## 2013-05-10 NOTE — Patient Instructions (Addendum)
Stay on your current medicines  We will check your labs today  Start walking more - try to get your weight back down - cut back on your salt  I will see you in 4 months  Call the [facility] office at [phone] if you have any questions, problems or concerns.

## 2013-05-10 NOTE — Progress Notes (Signed)
Destiny Ballard Date of Birth: [birth year] Medical Record #[id number]  History of Present Illness: "Destiny Ballard" is seen back today for a follow up visit. Seen for Destiny Ballard. Former patient of Destiny Ballard. Last seen here back in March of 2013. Has known CAD with PCI to the LAD in 2007, normal Myoview in 2010 and 2012. Last cath in 2008 - stent was patent. She has had atypical chest pain ever since her PCI. Other issues include HTN, HLD, obesity, anxiety, depression with prior suicide attempt and nephrolithiasis.   Last seen March of 2013 - was for atypical chest pain - BP was up and she was to monitor - never got her recall letter.   Comes back today. Here with her husband, "[name]". She retired back in May from the [employer]. Has gained weight. No chest pain. Worried that she might be diabetic. Some swelling in her legs - not using support stockings. ?salt use. No recent labs.   Current Outpatient Prescriptions  Medication Sig Dispense Refill  . amLODipine (NORVASC) 10 MG tablet Take 10 mg by mouth daily.      [name] aspirin EC 81 MG tablet Take 81 mg by mouth daily.      . benzonatate (TESSALON) 100 MG capsule TAKE 1 or 2 CAPSULES BY MOUTH 2TIMES A DAY AS NEEDED FOR COUGH  180 capsule  1  . Cholecalciferol (VITAMIN D3) 1000 UNITS CAPS Take 1,000 Units by mouth daily.       . fexofenadine (ALLEGRA) 30 MG tablet Take 30 mg by mouth 2 (two) times daily.      [name] FLUoxetine (PROZAC) 40 MG capsule Take 1 capsule (40 mg total) by mouth daily.  90 capsule  2  . fluticasone (VERAMYST) 27.5 MCG/SPRAY nasal spray Place 2 sprays into the nose daily.      . metoprolol succinate (TOPROL-XL) 100 MG 24 hr tablet TAKE 1 TABLET BY MOUTH EVERY DAY  90 tablet  0  . nitroGLYCERIN (NITROSTAT) 0.4 MG SL tablet Place 0.4 mg under the tongue every 5 (five) minutes as needed.      . rosuvastatin (CRESTOR) 20 MG tablet Take 1 tablet (20 mg total) by mouth daily.  90 tablet  2   No current facility-administered  medications for this visit.    Allergies  Allergen Reactions  . Prednisone     REACTION: mood swings, nightmares. "Shot doesn't bother me, reaction is just with the pill"    Past Medical History  Diagnosis Date  . Hypertension   . Hyperlipidemia   . Obesity   . Anxiety     Prior suicide attempt  . Depression   . Insulin resistance   . Iron deficiency anemia   . GERD (gastroesophageal reflux disease)   . Cervical spondylosis   . CAD (coronary artery disease)     a) s/p DES to LAD 07/2005 b) Last Myoview low risk 11/2011 showing small fixed apical perfusion defect (prior MI vs attenuation) but no ischemia - normal EF.    Past Surgical History  Procedure Laterality Date  . Cardiac catheterization  06/17/2007    NORMAL. EF 60%  . Coronary stent placement  07/2005    LEFT ANTERIOR DESCENDING  . Tubal ligation    . Tonsillectomy and adenoidectomy    . Childbirth      X3  . Breast enhancement surgery    . Cervical spondylosis      SINGLE LEVEL FUSION  . Incision and drainage breast abscess  01/05/2012       .  Forearm fracture surgery  2010    hand and shoulder     History  Smoking status  . Never Smoker   Smokeless tobacco  . Never Used    History  Alcohol Use  . 0.6 oz/week  . 1 Glasses of wine per week    Comment: social    Family History  Problem Relation Age of Onset  . Heart attack Mother   . Diabetes Mother   . Lung cancer Mother   . Suicidality Father   . Asthma Daughter     x2  . Allergies Other     all family--seasonal allergies  . Asthma Mother     Review of Systems: The review of systems is per the HPI.  All other systems were reviewed and are negative.  Physical Exam: BP 148/78  Pulse 62  Ht 5' (1.524 m)  Wt 179 lb 1.9 oz (81.248 kg)  BMI 34.98 kg/m2 Patient is very pleasant and in no acute distress. She has gained 18 pounds since last seen. She is obese. Skin is warm and dry. Color is normal.  HEENT is unremarkable.  Normocephalic/atraumatic. PERRL. Sclera are nonicteric. Neck is supple. No masses. No JVD. Lungs are clear. Cardiac exam shows a regular rate and rhythm. Abdomen is soft. Extremities are without edema. Gait and ROM are intact. No gross neurologic deficits noted.  LABORATORY DATA: PENDING  Lab Results  Component Value Date   WBC 8.5 02/10/2013   HGB 12.6 02/10/2013   HCT 40.5 02/10/2013   PLT 274 07/22/2012   GLUCOSE 115* 02/10/2013   CHOL 179 03/23/2012   TRIG 284* 03/23/2012   HDL 52 03/23/2012   LDLDIRECT 91.1 02/27/2011   LDLCALC 70 03/23/2012   ALT 19 07/22/2012   AST 27 07/22/2012   NA 139 02/10/2013   K 4.1 02/10/2013   CL 100 02/10/2013   CREATININE 0.72 02/10/2013   BUN 22 02/10/2013   CO2 28 02/10/2013   TSH 0.444 09/16/2011   INR 0.97 12/11/2011   HGBA1C 6.4 12/03/2011     Assessment / Plan: 1. CAD with remote stenting of the LAD - last Myoview in 2012 was normal - last cath in 2008 documented patency - no chest pain. Needs to work on CV risk factor modification.   2. HTN - BP probably not at goal - wanting to try to work on her diet/weight - will monitor at home. I will see her back in 4 months.   3. HLD - checking labs today.  Patient is agreeable to this plan and will call if any problems develop in the interim.   Rosalio Macadamia, RN, ANP-C [facility] [address] [place], [place]  [address]

## 2013-05-11 ENCOUNTER — Telehealth: Payer: Self-pay | Admitting: Cardiology

## 2013-05-11 NOTE — Telephone Encounter (Signed)
New Problem:  Pt states she is calling to hear her recent test results. Please advise

## 2013-05-11 NOTE — Telephone Encounter (Signed)
LMTCB

## 2013-05-11 NOTE — Telephone Encounter (Signed)
Follow up  ° ° °Pt returning call  °

## 2013-05-11 NOTE — Telephone Encounter (Signed)
Spoke with patient about 05/10/13 lab results and recommendations.

## 2013-05-21 ENCOUNTER — Other Ambulatory Visit: Payer: Self-pay | Admitting: Obstetrics and Gynecology

## 2013-05-21 DIAGNOSIS — T8549XA Other mechanical complication of breast prosthesis and implant, initial encounter: Secondary | ICD-10-CM

## 2013-05-21 DIAGNOSIS — M8580 Other specified disorders of bone density and structure, unspecified site: Secondary | ICD-10-CM

## 2013-05-21 DIAGNOSIS — Z780 Asymptomatic menopausal state: Secondary | ICD-10-CM

## 2013-05-24 ENCOUNTER — Ambulatory Visit (INDEPENDENT_AMBULATORY_CARE_PROVIDER_SITE_OTHER): Payer: No Typology Code available for payment source | Admitting: Family Medicine

## 2013-05-24 ENCOUNTER — Ambulatory Visit: Payer: No Typology Code available for payment source

## 2013-05-24 VITALS — BP 128/64 | HR 90 | Temp 98.2°F | Resp 18

## 2013-05-24 DIAGNOSIS — K1370 Unspecified lesions of oral mucosa: Secondary | ICD-10-CM

## 2013-05-24 DIAGNOSIS — R221 Localized swelling, mass and lump, neck: Secondary | ICD-10-CM

## 2013-05-24 DIAGNOSIS — K121 Other forms of stomatitis: Secondary | ICD-10-CM

## 2013-05-24 DIAGNOSIS — K219 Gastro-esophageal reflux disease without esophagitis: Secondary | ICD-10-CM

## 2013-05-24 DIAGNOSIS — R6889 Other general symptoms and signs: Secondary | ICD-10-CM

## 2013-05-24 DIAGNOSIS — R1011 Right upper quadrant pain: Secondary | ICD-10-CM

## 2013-05-24 DIAGNOSIS — R0789 Other chest pain: Secondary | ICD-10-CM

## 2013-05-24 LAB — POCT CBC
Granulocyte percent: 64.6 %G (ref 37–80)
HCT, POC: 38.7 % (ref 37.7–47.9)
Hemoglobin: 12 g/dL — AB (ref 12.2–16.2)
Lymph, poc: 2.2 (ref 0.6–3.4)
MCH, POC: 25.5 pg — AB (ref 27–31.2)
MCHC: 31 g/dL — AB (ref 31.8–35.4)
MCV: 82.2 fL (ref 80–97)
MID (cbc): 0.8 (ref 0–0.9)
MPV: 8.1 fL (ref 0–99.8)
POC Granulocyte: 5.5 (ref 2–6.9)
POC LYMPH PERCENT: 26.2 %L (ref 10–50)
POC MID %: 9.2 %M (ref 0–12)
Platelet Count, POC: 281 10*3/uL (ref 142–424)
RBC: 4.71 M/uL (ref 4.04–5.48)
RDW, POC: 14.8 %
WBC: 8.5 10*3/uL (ref 4.6–10.2)

## 2013-05-24 MED ORDER — MAGIC MOUTHWASH W/LIDOCAINE: 5.0000 mL | Freq: Three times a day (TID) | ORAL | Status: DC | PRN

## 2013-05-24 NOTE — Patient Instructions (Signed)

## 2013-05-24 NOTE — Progress Notes (Signed)
[facility]:  Office Visit  Chief Complaint:  Chief Complaint  Patient presents with  . Abdominal Pain    feels like something in throat  . Nausea  . recently started ABX    for MRSA in groin  . Rash    inside of mouth feels raw- rash around lips    HPI: Destiny Ballard is a 62 y.o. female who is here for throat and right side upper abd/rib pain. The symptoms started about 30 minutes ago after she ate a waffle with strawberries on it. She started having problems with her throat after eating. She said it feels like she has something stuck in her throat , she denies pain with swallowing. She has a history of acid reflux. She denies having any other upper GI issues ie like a narrowed esophagus, this has never happened to her before. She states that her upper abdomen along her rib cage hurts as well, just a tiny bit, no radiation. The pain there is dull but she she is really more concerned about her throat. She denies chest pain in the traditional sense, no left sided CP, no nausea, vomiting, diaphoresis, palpitations, weakness, numbness/tingling, SOB, wheezing, pedal edema. She still has her gallbladder but denies gallstones, history of renal stones. She denies and back pain or urinary sxs.   She does have a significant heart history: Cardiologist is Dr. Shirlee Latch. Former patient of Dr. Ronnald Nian. Last seen here back in March of 2013. Has known CAD with PCI to the LAD in 2007, normal Myoview in 2010 and 2012. Last cath in 2008 - stent was patent. She has had atypical chest pain ever since her PCI. Other issues include HTN, HLD, obesity, anxiety, depression with prior suicide attempt and nephrolithiasis.   She is currently on antibiotics for MRSA in her groin. Bactrim DS 05/18/13 for 2 groin abscess by her Ob/gyn. She has a history of GERD not on PPI. She states her mouth feels "raw" from the abx.   Past Medical History  Diagnosis Date  . Hypertension   . Hyperlipidemia   .  Obesity   . Anxiety     Prior suicide attempt  . Depression   . Insulin resistance   . Iron deficiency anemia   . GERD (gastroesophageal reflux disease)   . Cervical spondylosis   . CAD (coronary artery disease)     a) s/p DES to LAD 07/2005 b) Last Myoview low risk 11/2011 showing small fixed apical perfusion defect (prior MI vs attenuation) but no ischemia - normal EF.   Past Surgical History  Procedure Laterality Date  . Cardiac catheterization  06/17/2007    NORMAL. EF 60%  . Coronary stent placement  07/2005    LEFT ANTERIOR DESCENDING  . Tubal ligation    . Tonsillectomy and adenoidectomy    . Childbirth      X3  . Breast enhancement surgery    . Cervical spondylosis      SINGLE LEVEL FUSION  . Incision and drainage breast abscess  01/05/2012       . Forearm fracture surgery  2010    hand and shoulder    History   Social History  . Marital Status: Married    Spouse Name: N/A    Number of Children: N/A  . Years of Education: N/A   Social History Main Topics  . Smoking status: Never Smoker   . Smokeless tobacco: Never Used  . Alcohol Use: 0.6 oz/week  1 Glasses of wine per week     Comment: social  . Drug Use: No  . Sexual Activity: Yes    Birth Control/ Protection: None   Other Topics Concern  . None   Social History Narrative  . None   Family History  Problem Relation Age of Onset  . Heart attack Mother   . Diabetes Mother   . Lung cancer Mother   . Suicidality Father   . Asthma Daughter     x2  . Allergies Other     all family--seasonal allergies  . Asthma Mother    Allergies  Allergen Reactions  . Prednisone     REACTION: mood swings, nightmares. "Shot doesn't bother me, reaction is just with the pill"   Prior to Admission medications   Medication Sig Start Date End Date Taking? Authorizing Provider  amLODipine (NORVASC) 10 MG tablet Take 10 mg by mouth daily.   Yes Historical Provider, MD  aspirin EC 81 MG tablet Take 81 mg by mouth  daily.   Yes Historical Provider, MD  benzonatate (TESSALON) 100 MG capsule TAKE 1 or 2 CAPSULES BY MOUTH 2TIMES A DAY AS NEEDED FOR COUGH 03/09/13  Yes Gwenlyn Found Copland, MD  Cholecalciferol (VITAMIN D3) 1000 UNITS CAPS Take 1,000 Units by mouth daily.    Yes Historical Provider, MD  fexofenadine (ALLEGRA) 30 MG tablet Take 30 mg by mouth 2 (two) times daily.   Yes Historical Provider, MD  FLUoxetine (PROZAC) 40 MG capsule Take 1 capsule (40 mg total) by mouth daily. 02/10/13  Yes Jessica C Copland, MD  fluticasone (VERAMYST) 27.5 MCG/SPRAY nasal spray Place 2 sprays into the nose daily.   Yes Historical Provider, MD  metoprolol succinate (TOPROL-XL) 100 MG 24 hr tablet TAKE 1 TABLET BY MOUTH EVERY DAY 10/03/12  Yes Laurey Morale, MD  rosuvastatin (CRESTOR) 20 MG tablet Take 1 tablet (20 mg total) by mouth daily. 06/01/12 06/01/13 Yes Ryan M Dunn, PA-C  Vitamin D, Ergocalciferol, (DRISDOL) 50000 UNITS CAPS capsule Take 50,000 Units by mouth every 7 (seven) days.   Yes Historical Provider, MD  amLODipine (NORVASC) 10 MG tablet Take 10 mg by mouth daily. 02/28/12 05/10/13  Laurey Morale, MD  nitroGLYCERIN (NITROSTAT) 0.4 MG SL tablet Place 0.4 mg under the tongue every 5 (five) minutes as needed. 12/11/11 05/10/13  Dayna N Dunn, PA-C     ROS: The patient denies fevers, chills, night sweats, unintentional weight loss,  palpitations, wheezing, dyspnea on exertion, dysuria, hematuria, melena, numbness, weakness, or tingling.   All other systems have been reviewed and were otherwise negative with the exception of those mentioned in the HPI and as above.    PHYSICAL EXAM: Filed Vitals:   05/24/13 1255  BP: 128/64  Pulse: 90  Temp: 98.2 F (36.8 C)  Resp: 18   There were no vitals filed for this visit. There is no weight on file to calculate BMI.  General: Alert, no acute distress HEENT:  Normocephalic, atraumatic, oropharynx patent. EOMI, PERRLA, nonobtructive, no airway issues, neg for  thrush Cardiovascular:  Regular rate and rhythm, no rubs murmurs or gallops.  No Carotid bruits, radial pulse intact. No pedal edema.  Respiratory: Clear to auscultation bilaterally.  No wheezes, rales, or rhonchi.  No cyanosis, no use of accessory musculature GI: No organomegaly, abdomen is soft and non-tender, positive bowel sounds.  No masses. Skin: No rashes. Neurologic: Facial musculature symmetric. Psychiatric: Patient is appropriate throughout our interaction. Lymphatic: No cervical lymphadenopathy Musculoskeletal:  Gait intact.   LABS: Results for orders placed in visit on 05/24/13  POCT CBC      Result Value Range   WBC 8.5  4.6 - 10.2 K/uL   Lymph, poc 2.2  0.6 - 3.4   POC LYMPH PERCENT 26.2  10 - 50 %L   MID (cbc) 0.8  0 - 0.9   POC MID % 9.2  0 - 12 %M   POC Granulocyte 5.5  2 - 6.9   Granulocyte percent 64.6  37 - 80 %G   RBC 4.71  4.04 - 5.48 M/uL   Hemoglobin 12.0 (*) 12.2 - 16.2 g/dL   HCT, POC 21.3  08.6 - 47.9 %   MCV 82.2  80 - 97 fL   MCH, POC 25.5 (*) 27 - 31.2 pg   MCHC 31.0 (*) 31.8 - 35.4 g/dL   RDW, POC 57.8     Platelet Count, POC 281  142 - 424 K/uL   MPV 8.1  0 - 99.8 fL     EKG/XRAY:   Primary read interpreted by Dr. Conley Rolls at [facility]. CXR  No acutecardiopulmonary process Soft tissue throat no acute abnormilites, + c 4 plate and screws, no foreign bodies other than that, no epiglottis   ASSESSMENT/PLAN: Encounter Diagnoses  Name Primary?  . Sensation of lump in throat   . Mouth ulcer   . Abdominal pain, right upper quadrant   . Chest pressure   . GERD (gastroesophageal reflux disease) Yes   Unlikely cardiac or pulmonary in origin, she has a history of GERD and atypical CP. Does not feel like either. She may have just irritated her esophagus eating waffles and strawberries.Since it feels like there is a lump in her throat. There is no e/o thrush. Nothing obstructive in soft tissue c spine ( old ant c 4-5 palte and screws), no aspiration  PNA/foreign objects on CXR Monitor for worsening sxs OTC prilosec Rx Magic mouthwas (no hydrocortisone) with lidocaine Warm water gargles Go to ER prn F/u prn  Gross sideeffects, risk and benefits, and alternatives of medications d/w patient. Patient is aware that all medications have potential sideeffects and we are unable to predict every sideeffect or drug-drug interaction that may occur.  LE, THAO PHUONG, DO 05/24/2013 2:35 PM

## 2013-06-02 ENCOUNTER — Telehealth: Payer: Self-pay

## 2013-06-02 NOTE — Telephone Encounter (Signed)
PT WOULD LIKE TO KNOW IF DR COPLAND WOULD CALL HER IN SOME COUGH MEDICINE. HAD GIVEN IT TO HER A WHILE AGO BEFORE. PLEASE CALL [phone]    [facility] [address]

## 2013-06-02 NOTE — Telephone Encounter (Signed)
She was here to see Dr Conley Rolls, message sent to her.

## 2013-06-03 ENCOUNTER — Other Ambulatory Visit: Payer: Self-pay | Admitting: Family Medicine

## 2013-06-03 ENCOUNTER — Other Ambulatory Visit: Payer: Self-pay | Admitting: Cardiology

## 2013-06-03 MED ORDER — HYDROCODONE-HOMATROPINE 5-1.5 MG/5ML PO SYRP: 5.0000 mL | ORAL_SOLUTION | Freq: Every evening | ORAL | Status: DC | PRN

## 2013-06-12 ENCOUNTER — Other Ambulatory Visit: Payer: Self-pay | Admitting: Emergency Medicine

## 2013-06-24 ENCOUNTER — Other Ambulatory Visit: Payer: No Typology Code available for payment source

## 2013-06-28 ENCOUNTER — Other Ambulatory Visit: Payer: Self-pay | Admitting: Cardiology

## 2013-07-16 ENCOUNTER — Telehealth: Payer: Self-pay

## 2013-07-16 NOTE — Telephone Encounter (Signed)
Patient is requesting another round of HYDROcodone-homatropine (HYCODAN) 5-1.5 MG/5ML syrup   [phone]

## 2013-07-16 NOTE — Telephone Encounter (Signed)
Called her to advise.  

## 2013-07-16 NOTE — Telephone Encounter (Signed)
Pt has not been seen since 11/14 - she will need OV.

## 2013-07-23 ENCOUNTER — Ambulatory Visit (INDEPENDENT_AMBULATORY_CARE_PROVIDER_SITE_OTHER): Payer: No Typology Code available for payment source | Admitting: Physician Assistant

## 2013-07-23 VITALS — BP 132/70 | HR 84 | Temp 98.5°F | Resp 18 | Ht 59.0 in | Wt 180.0 lb

## 2013-07-23 DIAGNOSIS — J40 Bronchitis, not specified as acute or chronic: Secondary | ICD-10-CM

## 2013-07-23 DIAGNOSIS — R05 Cough: Secondary | ICD-10-CM

## 2013-07-23 DIAGNOSIS — R059 Cough, unspecified: Secondary | ICD-10-CM

## 2013-07-23 MED ORDER — AZITHROMYCIN 250 MG PO TABS: ORAL_TABLET | ORAL | Status: DC

## 2013-07-23 MED ORDER — IPRATROPIUM BROMIDE 0.03 % NA SOLN: 2.0000 | Freq: Two times a day (BID) | NASAL | Status: DC

## 2013-07-23 MED ORDER — HYDROCODONE-HOMATROPINE 5-1.5 MG/5ML PO SYRP: 5.0000 mL | ORAL_SOLUTION | Freq: Every evening | ORAL | Status: DC | PRN

## 2013-07-23 NOTE — Patient Instructions (Signed)
Begin taking the antibiotic as directed.  Be sure to finish the full course.  This medicine stays in your body for 2 weeks after your last dose, so it will keep working  Continue the [employer].  Add the ipratropium (Atrovent) nasal spray as well.  Use 2-3 times per day to help with congestion and post-nasal drainage.  Use the Tessalon Perles for cough.  Use the Hycodan syrup at bed time.  Drink plenty of fluids - water is best!  And get plenty of rest  Please let us know if any symptoms are worsening or not improving   Bronchitis Bronchitis is the body's way of reacting to injury and/or infection (inflammation) of the bronchi. Bronchi are the air tubes that extend from the windpipe into the lungs. If the inflammation becomes severe, it may cause shortness of breath. CAUSES  Inflammation may be caused by:  A virus.  Germs (bacteria).  Dust.  Allergens.  Pollutants and many other irritants. The cells lining the bronchial tree are covered with tiny hairs (cilia). These constantly beat upward, away from the lungs, toward the mouth. This keeps the lungs free of pollutants. When these cells become too irritated and are unable to do their job, mucus begins to develop. This causes the characteristic cough of bronchitis. The cough clears the lungs when the cilia are unable to do their job. Without either of these protective mechanisms, the mucus would settle in the lungs. Then you would develop pneumonia. Smoking is a common cause of bronchitis and can contribute to pneumonia. Stopping this habit is the single most important thing you can do to help yourself. TREATMENT   Your caregiver may prescribe an antibiotic if the cough is caused by bacteria. Also, medicines that open up your airways make it easier to breathe. Your caregiver may also recommend or prescribe an expectorant. It will loosen the mucus to be coughed up. Only take over-the-counter or prescription medicines for pain,  discomfort, or fever as directed by your caregiver.  Removing whatever causes the problem (smoking, for example) is critical to preventing the problem from getting worse.  Cough suppressants may be prescribed for relief of cough symptoms.  Inhaled medicines may be prescribed to help with symptoms now and to help prevent problems from returning.  For those with recurrent (chronic) bronchitis, there may be a need for steroid medicines. SEEK IMMEDIATE MEDICAL CARE IF:   During treatment, you develop more pus-like mucus (purulent sputum).  You have a fever.  You become progressively more ill.  You have increased difficulty breathing, wheezing, or shortness of breath. It is necessary to seek immediate medical care if you are elderly or sick from any other disease. MAKE SURE YOU:   Understand these instructions.  Will watch your condition.  Will get help right away if you are not doing well or get worse. Document Released: 07/01/2005 Document Revised: 03/03/2013 Document Reviewed: 02/23/2013 [facility] Patient Information 2014 [place].

## 2013-07-23 NOTE — Progress Notes (Signed)
   Subjective:    Patient ID: [name], female    DOB: [birth year], 63 y.o.   MRN: [id number]  HPI   Ms. Island is a 63 yr old female here with concern for illness.  Reports that she became sick on 07/15/13.  Symptoms include non-prod cough, wheezing - both worse when laying down.  Nasal congestion, nasal drainage, post-nasal drainage.  Some fever initially but none recently.  Cough and drainage are bad at night, has been sleeping in recliner last 3 days so she can get some rest.  Denies smoking.  States she has "a touch of asthma, it's in the family."  Prone to bronchitis.  Using Mucinex, inhaler, Veramyst, Tessalon.  Ran out of Hycodan - this is the only thing that works.  Would like a larger quantity or refills.  Denies CP, SOB.  Occ ankle swelling but this is not new.  Follows with cardiology and pulmonology.   Review of Systems  Constitutional: Negative for fever and chills.  HENT: Positive for congestion, postnasal drip and rhinorrhea. Negative for sore throat.   Respiratory: Positive for cough and wheezing. Negative for shortness of breath.   Cardiovascular: Positive for leg swelling (occasional). Negative for chest pain.  Gastrointestinal: Negative.   Musculoskeletal: Negative.   Skin: Negative.   Neurological: Negative for headaches.       Objective:   Physical Exam  Vitals reviewed. Constitutional: She is oriented to person, place, and time. She appears well-developed and well-nourished. No distress.  HENT:  Head: Normocephalic and atraumatic.  Right Ear: Tympanic membrane and ear canal normal.  Left Ear: Tympanic membrane and ear canal normal.  Nose: Mucosal edema and rhinorrhea present.  Mouth/Throat: Uvula is midline, oropharynx is clear and moist and mucous membranes are normal.  Eyes: Conjunctivae are normal. No scleral icterus.  Neck: Neck supple.  Cardiovascular: Normal rate and normal heart sounds.   Pulmonary/Chest: Effort normal and breath sounds normal.  She has no wheezes. She has no rales.  Abdominal: Soft. There is no tenderness.  Lymphadenopathy:    She has no cervical adenopathy.  Neurological: She is alert and oriented to person, place, and time.  Skin: Skin is warm and dry.  Psychiatric: She has a normal mood and affect. Her behavior is normal.        Assessment & Plan:  Bronchitis - Plan: azithromycin (ZITHROMAX) 250 MG tablet  Cough - Plan: HYDROcodone-homatropine (HYCODAN) 5-1.5 MG/5ML syrup   Ms. Billard is a 63 yr old female with 10 days of nasal congestion, post-nasal drainage, cough, wheezing.  Today she is afebrile, lungs are CTA bilaterally.  Her cough is non-productive.  She denies CP, new peripheral edema.  No hx heart failure.  She has had multiple CXRs in the past year - I have not repeated today as pt is afebrile and reassuring exam.  Will treat with azithro to cover bronchitis/atypicals.  Add atrovent nasal to help with congestion.  Continue Tessalon Perles.  I have refilled Hycodan today.  Discussed with pt and husband why we cannot provide refills on the cough syrup, and they express understanding.  Discussed RTC precautions including fever, worsening cough, SOB, CP, etc.     E. Natividad Brood MHS, PA-C [facility] 1/9/20158:10 PM

## 2013-07-24 ENCOUNTER — Other Ambulatory Visit: Payer: Self-pay | Admitting: Physician Assistant

## 2013-07-27 ENCOUNTER — Telehealth: Payer: Self-pay

## 2013-07-27 NOTE — Telephone Encounter (Signed)
Pt states medicines prescribed for her on recent visit are making her nauseous.Please advise   Best phone [phone]   Pharmacy [facility]

## 2013-07-27 NOTE — Telephone Encounter (Signed)
Pt only has one dose of Azithromycin left. She is going to take that dose with dinner and take the Hycodan at bedtime. She felt ill last night she took all of her medication at once.   Advised pt to RTC if her symptoms fail to improve or she feels she is getting worse.

## 2013-08-23 ENCOUNTER — Other Ambulatory Visit: Payer: Self-pay | Admitting: Family Medicine

## 2013-08-25 NOTE — Telephone Encounter (Signed)
Do you want to give 1 RF or do you need to see pt back?

## 2013-08-27 ENCOUNTER — Ambulatory Visit: Payer: No Typology Code available for payment source | Admitting: Nurse Practitioner

## 2013-08-30 ENCOUNTER — Ambulatory Visit: Payer: No Typology Code available for payment source | Admitting: Nurse Practitioner

## 2013-09-01 ENCOUNTER — Other Ambulatory Visit: Payer: Self-pay | Admitting: Family Medicine

## 2013-09-02 ENCOUNTER — Other Ambulatory Visit: Payer: Self-pay | Admitting: Cardiology

## 2013-09-10 ENCOUNTER — Ambulatory Visit (INDEPENDENT_AMBULATORY_CARE_PROVIDER_SITE_OTHER): Payer: No Typology Code available for payment source | Admitting: Nurse Practitioner

## 2013-09-10 ENCOUNTER — Encounter: Payer: Self-pay | Admitting: Nurse Practitioner

## 2013-09-10 ENCOUNTER — Encounter (INDEPENDENT_AMBULATORY_CARE_PROVIDER_SITE_OTHER): Payer: Self-pay

## 2013-09-10 ENCOUNTER — Ambulatory Visit
Admission: RE | Admit: 2013-09-10 | Discharge: 2013-09-10 | Disposition: A | Discharge status: Home / Self Care | Payer: No Typology Code available for payment source | Source: Ambulatory Visit | Attending: Nurse Practitioner | Admitting: Nurse Practitioner

## 2013-09-10 ENCOUNTER — Ambulatory Visit: Payer: No Typology Code available for payment source | Admitting: Nurse Practitioner

## 2013-09-10 VITALS — BP 130/60 | HR 83 | Ht 60.0 in | Wt 184.4 lb

## 2013-09-10 DIAGNOSIS — E785 Hyperlipidemia, unspecified: Secondary | ICD-10-CM

## 2013-09-10 DIAGNOSIS — F329 Major depressive disorder, single episode, unspecified: Secondary | ICD-10-CM

## 2013-09-10 DIAGNOSIS — I10 Essential (primary) hypertension: Secondary | ICD-10-CM

## 2013-09-10 DIAGNOSIS — F32A Depression, unspecified: Secondary | ICD-10-CM

## 2013-09-10 DIAGNOSIS — I2510 Atherosclerotic heart disease of native coronary artery without angina pectoris: Secondary | ICD-10-CM

## 2013-09-10 DIAGNOSIS — F3289 Other specified depressive episodes: Secondary | ICD-10-CM

## 2013-09-10 LAB — BASIC METABOLIC PANEL
BUN: 20 mg/dL (ref 6–23)
CO2: 32 mEq/L (ref 19–32)
Calcium: 9.1 mg/dL (ref 8.4–10.5)
Chloride: 100 mEq/L (ref 96–112)
Creatinine, Ser: 0.8 mg/dL (ref 0.4–1.2)
GFR: 81.36 mL/min (ref >60.00)
Glucose, Bld: 121 mg/dL — ABNORMAL HIGH (ref 70–99)
Potassium: 3.8 mEq/L (ref 3.5–5.1)
Sodium: 138 mEq/L (ref 135–145)

## 2013-09-10 LAB — HEMOGLOBIN A1C: Hgb A1c MFr Bld: 7.8 % — ABNORMAL HIGH (ref 4.6–6.5)

## 2013-09-10 LAB — TSH: TSH: 0.88 u[IU]/mL (ref 0.35–5.50)

## 2013-09-10 MED ORDER — FLUOXETINE HCL 40 MG PO CAPS: 40.0000 mg | ORAL_CAPSULE | Freq: Every day | ORAL | Status: DC

## 2013-09-10 MED ORDER — OMEPRAZOLE 40 MG PO CPDR: DELAYED_RELEASE_CAPSULE | ORAL | Status: DC

## 2013-09-10 MED ORDER — AMLODIPINE BESYLATE 10 MG PO TABS: ORAL_TABLET | ORAL | Status: DC

## 2013-09-10 MED ORDER — ROSUVASTATIN CALCIUM 20 MG PO TABS: ORAL_TABLET | ORAL | Status: DC

## 2013-09-10 MED ORDER — METOPROLOL SUCCINATE ER 100 MG PO TB24: ORAL_TABLET | ORAL | Status: DC

## 2013-09-10 NOTE — Progress Notes (Signed)
[name] Date of Birth: February 25, 1951 Medical Record [id number]  History of Present Illness: "Destiny Ballard" is seen back today for a follow up visit. It is a 4 month check. I am seeing her husband as well today. Seen for Dr. Aundra Dubin. Former patient of Dr. Susa Simmonds. Has known CAD with PCI to the LAD in 2007, normal Myoview in 2010 and 2012. Last cath in 2008 - stent was patent. She has had atypical chest pain ever since her PCI. Other issues include HTN, HLD, obesity, anxiety, depression with prior suicide attempt and nephrolithiasis.   Last seen back in October after a long absence. Was doing ok for the most part.   Comes back today. Here with her husband. Having more back issues. No chest pain. She continues to gain weight. Has had a barking cough - only better with tessalon pearls and prn narcotics. Limited by back pain. Not able to exercise. Worried about having diabetes. Has used her husband's glucometer - one reading was 237. Has not seen pulmonary in almost one year. Feels like her cough is allergy related. She is on allergy medicine and PPI therapy. No fever or chills but has nightsweats.  Current Outpatient Prescriptions  Medication Sig Dispense Refill  . amLODipine (NORVASC) 10 MG tablet TAKE 1 TABLET (10 MG TOTAL) BY MOUTH DAILY.  90 tablet  0  . aspirin EC 81 MG tablet Take 81 mg by mouth daily.      . benzonatate (TESSALON) 100 MG capsule TAKE 1 or 2 CAPSULES BY MOUTH 2TIMES A DAY AS NEEDED FOR COUGH  180 capsule  1  . Cholecalciferol (VITAMIN D3) 1000 UNITS CAPS Take 1,000 Units by mouth daily.       . CRESTOR 20 MG tablet TAKE 1 TABLET (20 MG TOTAL) BY MOUTH DAILY.  90 tablet  0  . fexofenadine (ALLEGRA) 180 MG tablet Take 180 mg by mouth daily.      [name] FLUoxetine (PROZAC) 40 MG capsule Take 1 capsule (40 mg total) by mouth daily.  90 capsule  2  . fluticasone (VERAMYST) 27.5 MCG/SPRAY nasal spray Place 2 sprays into the nose as needed.       [name] ibuprofen (ADVIL,MOTRIN) 200 MG  tablet Take 200 mg by mouth every 6 (six) hours as needed.      . Ibuprofen-Diphenhydramine Cit (MOTRIN PM PO) Take 38 mg by mouth as needed.      . metoprolol succinate (TOPROL-XL) 100 MG 24 hr tablet TAKE 1 TABLET BY MOUTH EVERY DAY  90 tablet  0  . nitroGLYCERIN (NITROSTAT) 0.4 MG SL tablet Place 0.4 mg under the tongue every 5 (five) minutes as needed.      [name] omeprazole (PRILOSEC) 40 MG capsule TAKE 1 CAPSULE (40 MG TOTAL) BY MOUTH DAILY.  90 capsule  0  . PROVENTIL HFA 108 (90 BASE) MCG/ACT inhaler SHAKE WELL AND INHALE 1 PUFF INTO THE LUNGS EVERY 6 HOURS AS NEEDED FOR SHORTNESS OF BREATH  6.7 each  0  . pyridOXINE (VITAMIN B-6) 100 MG tablet Take 100 mg by mouth daily.      [name] amLODipine (NORVASC) 10 MG tablet Take 10 mg by mouth daily.       No current facility-administered medications for this visit.    Allergies  Allergen Reactions  . Prednisone     REACTION: mood swings, nightmares. "Shot doesn't bother me, reaction is just with the pill"    Past Medical History  Diagnosis Date  . Hypertension   .  Hyperlipidemia   . Obesity   . Anxiety     Prior suicide attempt  . Depression   . Insulin resistance   . Iron deficiency anemia   . GERD (gastroesophageal reflux disease)   . Cervical spondylosis   . CAD (coronary artery disease)     a) s/p DES to LAD 07/2005 b) Last Myoview low risk 11/2011 showing small fixed apical perfusion defect (prior MI vs attenuation) but no ischemia - normal EF.    Past Surgical History  Procedure Laterality Date  . Cardiac catheterization  06/17/2007    NORMAL. EF 60%  . Coronary stent placement  07/2005    LEFT ANTERIOR DESCENDING  . Tubal ligation    . Tonsillectomy and adenoidectomy    . Childbirth      X3  . Breast enhancement surgery    . Cervical spondylosis      SINGLE LEVEL FUSION  . Incision and drainage breast abscess  01/05/2012       . Forearm fracture surgery  2010    hand and shoulder     History  Smoking status  . Never  Smoker   Smokeless tobacco  . Never Used    History  Alcohol Use  . 0.6 oz/week  . 1 Glasses of wine per week    Comment: social    Family History  Problem Relation Age of Onset  . Heart attack Mother   . Diabetes Mother   . Lung cancer Mother   . Suicidality Father   . Asthma Daughter     x2  . Allergies Other     all family--seasonal allergies  . Asthma Mother     Review of Systems: The review of systems is per the HPI.  All other systems were reviewed and are negative.  Physical Exam: BP 130/60  Pulse 83  Ht 5' (1.524 m)  Wt 184 lb 6.4 oz (83.643 kg)  BMI 36.01 kg/m2  SpO2 95% Patient is very pleasant and in no acute distress. She is obese. Skin is warm and dry. Color is normal. She has a loud barking cough noted.  HEENT is unremarkable. Normocephalic/atraumatic. PERRL. Sclera are nonicteric. Neck is supple. No masses. No JVD. Lungs are clear. Cardiac exam shows a regular rate and rhythm. Abdomen is soft. Extremities are without edema. Gait and ROM are intact. No gross neurologic deficits noted.  Wt Readings from Last 3 Encounters:  09/10/13 184 lb 6.4 oz (83.643 kg)  07/23/13 180 lb (81.647 kg)  05/10/13 179 lb 1.9 oz (81.248 kg)    LABORATORY DATA: PENDING  Lab Results  Component Value Date   WBC 8.5 05/24/2013   HGB 12.0* 05/24/2013   HCT 38.7 05/24/2013   PLT 274.0 05/10/2013   GLUCOSE 97 05/10/2013   CHOL 189 05/10/2013   TRIG 234.0* 05/10/2013   HDL 54.40 05/10/2013   LDLDIRECT 111.0 05/10/2013   LDLCALC 70 03/23/2012   ALT 30 05/10/2013   AST 25 05/10/2013   NA 138 05/10/2013   K 3.8 05/10/2013   CL 98 05/10/2013   CREATININE 0.6 05/10/2013   BUN 14 05/10/2013   CO2 30 05/10/2013   TSH 0.444 09/16/2011   INR 0.97 12/11/2011   HGBA1C 6.4 12/03/2011   Assessment / Plan:  1. CAD with remote stenting of the LAD - last Myoview in 2012 was normal. Last cath from 2008 showed stent patency. Continue to advise CV risk factor modification. No active  symptoms reported.   2. HTN -  BP looks ok - meds refilled today  3. HLD - on Crestor   4. Obesity - weight continues to go up. Will check glucose and A1C today  5. Cough - will send for CXR today. May need to get back to see pulmonary  Patient is agreeable to this plan and will call if any problems develop in the interim.   Burtis Junes, RN, [place] [facility] [address] [place], [place]  [address] [phone]

## 2013-09-10 NOTE — Patient Instructions (Addendum)
Stay on your current medicines - I have refilled your medicines today  We are going to check labs today and send you for a CXR  Please go to [facility] to [place] on the first floor for a chest Xray - you may walk in.   I will see you in 6 months  Call the [place] office at [phone] if you have any questions, problems or concerns.

## 2013-09-12 ENCOUNTER — Other Ambulatory Visit: Payer: Self-pay | Admitting: Physician Assistant

## 2013-09-22 ENCOUNTER — Ambulatory Visit (INDEPENDENT_AMBULATORY_CARE_PROVIDER_SITE_OTHER): Payer: No Typology Code available for payment source | Admitting: Family Medicine

## 2013-09-22 VITALS — BP 130/60 | HR 85 | Temp 98.0°F | Resp 16 | Ht 59.5 in | Wt 183.0 lb

## 2013-09-22 DIAGNOSIS — A4902 Methicillin resistant Staphylococcus aureus infection, unspecified site: Secondary | ICD-10-CM

## 2013-09-22 DIAGNOSIS — R059 Cough, unspecified: Secondary | ICD-10-CM

## 2013-09-22 DIAGNOSIS — R05 Cough: Secondary | ICD-10-CM

## 2013-09-22 MED ORDER — BENZONATATE 100 MG PO CAPS: ORAL_CAPSULE | ORAL | Status: DC

## 2013-09-22 MED ORDER — DOXYCYCLINE HYCLATE 100 MG PO TABS: 100.0000 mg | ORAL_TABLET | Freq: Two times a day (BID) | ORAL | Status: DC

## 2013-09-22 NOTE — Progress Notes (Signed)
63 yo woman with recurrent boils (this is number 6), this time on left upper biceps area.  Painful and intermittently draining.    Objective:  NAD 1-2 cm elevated pustular erythematous nodule left arm  Assessment:  Recurrent staph infections  MRSA (methicillin resistant Staphylococcus aureus) - Plan: doxycycline (VIBRA-TABS) 100 MG tablet  Cough - Plan: benzonatate (TESSALON) 100 MG capsule   Signed, Robyn Haber, MD

## 2013-09-22 NOTE — Patient Instructions (Signed)
MRSA Overview  MRSA stands for methicillin-resistant Staphylococcus aureus. It is a type of bacteria that is resistant to some common antibiotics. It can cause infections in the skin and many other places in the body. Staphylococcus aureus, often called "staph," is a bacteria that normally lives on the skin or in the nose. Staph on the surface of the skin or in the nose does not cause problems. However, if the staph enters the body through a cut, wound, or break in the skin, an infection can happen.  Up until recently, infections with the MRSA type of staph mainly occurred in hospitals and other healthcare settings. There are now increasing problems with MRSA infections in the community as well. Infections with MRSA may be very serious or even life-threatening. Most MRSA infections are acquired in one of two ways:  · Healthcare-associated MRSA (HA-MRSA)  · This can be acquired by people in any healthcare setting. MRSA can be a big problem for hospitalized people, people in nursing homes, people in rehabilitation facilities, people with weakened immune systems, dialysis patients, and those who have had surgery.  · Community-associated MRSA (CA-MRSA)  · Community spread of MRSA is becoming more common. It is known to spread in crowded settings, in jails and prisons, and in situations where there is close skin-to-skin contact, such as during sporting events or in locker rooms. MRSA can be spread through shared items, such as children's toys, razors, towels, or sports equipment.  CAUSES   All staph, including MRSA, are normally harmless unless they enter the body through a scratch, cut, or wound, such as with surgery. All staph, including MRSA, can be spread from person-to-person by touching contaminated objects or through direct contact.  SPECIAL GROUPS  MRSA can present problems for special groups of people. Some of these groups include:  · Breastfeeding women.  · The most common problem is MRSA infection of the  breast (mastitis). There is evidence that MRSA can be passed to an infant from infected breast milk. Your caregiver may recommend that you stop breastfeeding until the mastitis is under control.  · If you are breastfeeding and have a MRSA infection in a place other than the breast, you may usually continue breastfeeding while under treatment. If taking antibiotics, ask your caregiver if it is safe to continue breastfeeding while taking your prescribed medicines.  · Neonates (babies from birth to 1 month old) and infants (babies from 1 month to 1 year old).  · There is evidence that MRSA can be passed to a newborn at birth if the mother has MRSA on the skin, in or around the birth canal, or an infection in the uterus, cervix, or vagina. MRSA infection can have the same appearance as a normal newborn or infant rash or several other skin infections. This can make it hard to diagnose MRSA.  · Immune compromised people.  · If you have an immune system problem, you may have a higher chance of developing a MRSA infection.  · People after any type of surgery.  · Staph in general, including MRSA, is the most common cause of infections occurring at the site of recent surgery.  · People on long-term steroid medicines.  · These kinds of medicines can lower your resistance to infection. This can increase your chance of getting MRSA.  · People who have had frequent hospitalizations, live in nursing homes or other residential care facilities, have venous or urinary catheters, or have taken multiple courses of antibiotic therapy for any reason.    DIAGNOSIS   Diagnosis of MRSA is done by cultures of fluid samples that may come from:  · Swabs taken from cuts or wounds in infected areas.  · Nasal swabs.  · Saliva or deep cough specimens from the lungs (sputum).  · Urine.  · Blood.  Many people are "colonized" with MRSA but have no signs of infection. This means that people carry the MRSA germ on their skin or in their nose and may  never develop MRSA infection.   TREATMENT   Treatment varies and is based on how serious, how deep, or how extensive the infection is. For example:  · Some skin infections, such as a small boil or abscess, may be treated by draining yellowish-white fluid (pus) from the site of the infection.  · Deeper or more widespread soft tissue infections are usually treated with surgery to drain pus and with antibiotic medicine given by vein or by mouth. This may be recommended even if you are pregnant.  · Serious infections may require a hospital stay.  If antibiotics are given, they may be needed for several weeks.  PREVENTION   Because many people are colonized with staph, including MRSA, preventing the spread of the bacteria from person-to-person is most important. The best way to prevent the spread of bacteria and other germs is through proper hand washing or by using alcohol-based hand disinfectants. The following are other ways to help prevent MRSA infection within the hospital and community settings.   · Healthcare settings:  · Strict hand washing or hand disinfection procedures need to be followed before and after touching every patient.  · Patients infected with MRSA are placed in isolation to prevent the spread of the bacteria.  · Healthcare workers need to wear disposable gowns and gloves when touching or caring for patients infected with MRSA. Visitors may also be asked to wear a gown and gloves.  · Hospital surfaces need to be disinfected frequently.  · Community settings:  · Wash your hands frequently with soap and water for at least 15 seconds. Otherwise, use alcohol-based hand disinfectants when soap and water is not available.  · Make sure people who live with you wash their hands often, too.  · Do not share personal items. For example, avoid sharing razors and other personal hygiene items, towels, clothing, and athletic equipment.  · Wash and dry your clothes and bedding at the warmest temperatures  recommended on the labels.  · Keep wounds covered. Pus from infected sores may contain MRSA and other bacteria. Keep cuts and abrasions clean and covered with germ-free (sterile), dry bandages until they are healed.  · If you have a wound that appears infected, ask your caregiver if a culture for MRSA and other bacteria should be done.  · If you are breastfeeding, talk to your caregiver about MRSA. You may be asked to temporarily stop breastfeeding.  HOME CARE INSTRUCTIONS   · Take your antibiotics as directed. Finish them even if you start to feel better.  · Avoid close contact with those around you as much as possible. Do not use towels, razors, toothbrushes, bedding, or other items that will be used by others.  · To fight the infection, follow your caregiver's instructions for wound care. Wash your hands before and after changing your bandages.  · If you have an intravascular device, such as a catheter, make sure you know how to care for it.  · Be sure to tell any healthcare providers that you have MRSA   so they are aware of your infection.  SEEK IMMEDIATE MEDICAL CARE IF:   · The infection appears to be getting worse. Signs include:  · Increased warmth, redness, or tenderness around the wound site.  · A red line that extends from the infection site.  · A dark color in the area around the infection.  · Wound drainage that is tan, yellow, or green.  · A bad smell coming from the wound.  · You feel sick to your stomach (nauseous) and throw up (vomit) or cannot keep medicine down.  · You have a fever.  · Your baby is older than 3 months with a rectal temperature of 102° F (38.9° C) or higher.  · Your baby is 3 months old or younger with a rectal temperature of 100.4° F (38° C) or higher.  · You have difficulty breathing.  MAKE SURE YOU:   · Understand these instructions.  · Will watch your condition.  · Will get help right away if you are not doing well or get worse.  Document Released: 07/01/2005 Document Revised:  09/23/2011 Document Reviewed: 10/03/2010  [employer]® Patient Information ©2014 [employer].

## 2013-10-14 ENCOUNTER — Ambulatory Visit (INDEPENDENT_AMBULATORY_CARE_PROVIDER_SITE_OTHER): Payer: No Typology Code available for payment source | Admitting: Physician Assistant

## 2013-10-14 ENCOUNTER — Telehealth: Payer: Self-pay

## 2013-10-14 VITALS — BP 150/70 | HR 86 | Temp 97.4°F | Resp 20 | Ht 59.5 in | Wt 184.2 lb

## 2013-10-14 DIAGNOSIS — R05 Cough: Secondary | ICD-10-CM

## 2013-10-14 DIAGNOSIS — R059 Cough, unspecified: Secondary | ICD-10-CM

## 2013-10-14 DIAGNOSIS — J309 Allergic rhinitis, unspecified: Secondary | ICD-10-CM

## 2013-10-14 MED ORDER — MONTELUKAST SODIUM 10 MG PO TABS
10.0000 mg | ORAL_TABLET | Freq: Every day | ORAL | Status: DC
Start: 2013-10-14 — End: 2014-10-11

## 2013-10-14 MED ORDER — HYDROCOD POLST-CHLORPHEN POLST 10-8 MG/5ML PO LQCR: 5.0000 mL | Freq: Two times a day (BID) | ORAL | Status: DC | PRN

## 2013-10-14 NOTE — Telephone Encounter (Signed)
Advised pt to RTC- has not been seen for this.

## 2013-10-14 NOTE — Telephone Encounter (Signed)
Please get details and either have pt RTC or forward to Dr L for review.

## 2013-10-14 NOTE — Telephone Encounter (Signed)
PT STATES SHE NEED TO HAVE A WRITTEN PRESCRIPTION FOR SOME COUGH MEDICINE, THE PHARMACY WON'T SEND ANYTHING SINCE IT HAS TO BE WRITTEN PLEASE CALL PT AT [phone]

## 2013-10-14 NOTE — Patient Instructions (Signed)
Continue the Allegra and the Veramyst nasal spray. If you don't have significant improvement in the next 30 days, we'll plan to refer you to an ENT specialist.

## 2013-10-14 NOTE — Progress Notes (Signed)
Subjective:    Patient ID: Destiny Ballard, female    DOB: January 04, 1951, 63 y.o.   MRN: [id number]   PCP: Lamar Blinks, MD  Chief Complaint  Patient presents with  . Cough    light yellow sputum  x 6 days  . Sore Throat  . Wheezing    shortness of breath    Medications, allergies, past medical history, surgical history, family history, social history and problem list reviewed and updated.  Prior to Admission medications   Medication Sig Start Date End Date Taking? Authorizing Provider  amLODipine (NORVASC) 10 MG tablet TAKE 1 TABLET (10 MG TOTAL) BY MOUTH DAILY. 09/10/13  Yes Burtis Junes, NP  benzonatate (TESSALON) 100 MG capsule TAKE 1 or 2 CAPSULES BY MOUTH 2TIMES A DAY AS NEEDED FOR COUGH 09/22/13  Yes Robyn Haber, MD  Cholecalciferol (VITAMIN D3) 1000 UNITS CAPS Take 1,000 Units by mouth daily.    Yes Historical Provider, MD  fexofenadine (ALLEGRA) 180 MG tablet Take 180 mg by mouth 2 (two) times daily.    Yes Historical Provider, MD  FLUoxetine (PROZAC) 40 MG capsule Take 1 capsule (40 mg total) by mouth daily. 09/10/13  Yes Burtis Junes, NP  fluticasone (VERAMYST) 27.5 MCG/SPRAY nasal spray Place 2 sprays into the nose as needed.    Yes Historical Provider, MD  ibuprofen (ADVIL,MOTRIN) 200 MG tablet Take 200 mg by mouth every 6 (six) hours as needed.   Yes Historical Provider, MD  Ibuprofen-Diphenhydramine Cit (MOTRIN PM PO) Take 38 mg by mouth as needed.   Yes Historical Provider, MD  metoprolol succinate (TOPROL-XL) 100 MG 24 hr tablet TAKE 1 TABLET BY MOUTH EVERY DAY 09/10/13  Yes Burtis Junes, NP  nitroGLYCERIN (NITROSTAT) 0.4 MG SL tablet Place 0.4 mg under the tongue every 5 (five) minutes as needed. 12/11/11 10/14/13 Yes Dayna N Dunn, PA-C  omeprazole (PRILOSEC) 40 MG capsule TAKE 1 CAPSULE (40 MG TOTAL) BY MOUTH DAILY. 09/10/13  Yes Burtis Junes, NP  PROVENTIL HFA 108 (90 BASE) MCG/ACT inhaler SHAKE WELL AND INHALE 1 PUFF INTO THE LUNGS EVERY 6 HOURS AS  NEEDED FOR SHORTNESS OF BREATH 06/12/13  Yes Mancel Bale, PA-C  rosuvastatin (CRESTOR) 20 MG tablet TAKE 1 TABLET (20 MG TOTAL) BY MOUTH DAILY. 09/10/13  Yes Burtis Junes, NP     HPI  Presents with cough. Intermittent since January. Symptoms resolve while on treatment, then recur as soon as she finishes. She was treated for bronchitis in January with Azithromycin and Hycodan, and then in March with Tessalon Perles. "I know I have allergies." ST on Saturday 10/09/2013. Laryngitis is actually the biggest concern for her. Lots of nasal congestion and drainage. Nasal drainage is clear to yellowish. Sleeps in a recliner to help at night, but still not sleeping very well. No fever or chills.  Has seen pulmonology, last visit 3/14, but never followed up.  Was advised that her allergies were the cause of her cough. Dr. Agustina Caroli note is reviewed.   She notes that the bridge of her nose seems flatter on the RIGHT, which is the side that always feels more congested and wonders if that's part of the problem.  Review of Systems As above.    Objective:   Physical Exam  Vitals reviewed. Constitutional: She is oriented to person, place, and time. Vital signs are normal. She appears well-developed and well-nourished. No distress.  BP 150/70  Pulse 86  Temp(Src) 97.4 F (36.3 C) (Oral)  Resp  20  Ht 4' 11.5" (1.511 m)  Wt 184 lb 3.2 oz (83.553 kg)  BMI 36.60 kg/m2  SpO2 95%   HENT:  Head: Normocephalic and atraumatic.  Right Ear: Hearing, tympanic membrane, external ear and ear canal normal.  Left Ear: Hearing, tympanic membrane, external ear and ear canal normal.  Nose: Mucosal edema and rhinorrhea present.  No foreign bodies. Right sinus exhibits no maxillary sinus tenderness and no frontal sinus tenderness. Left sinus exhibits no maxillary sinus tenderness and no frontal sinus tenderness.  Mouth/Throat: Uvula is midline, oropharynx is clear and moist and mucous membranes are normal. No  uvula swelling. No oropharyngeal exudate.  Eyes: Conjunctivae and EOM are normal. Pupils are equal, round, and reactive to light. Right eye exhibits no discharge. Left eye exhibits no discharge. No scleral icterus.  Neck: Trachea normal, normal range of motion and full passive range of motion without pain. Neck supple. No mass and no thyromegaly present.  Cardiovascular: Normal rate, regular rhythm and normal heart sounds.   Pulmonary/Chest: Effort normal and breath sounds normal.  Lymphadenopathy:       Head (right side): No submandibular, no tonsillar, no preauricular, no posterior auricular and no occipital adenopathy present.       Head (left side): No submandibular, no tonsillar, no preauricular and no occipital adenopathy present.    She has no cervical adenopathy.       Right: No supraclavicular adenopathy present.       Left: No supraclavicular adenopathy present.  Neurological: She is alert and oriented to person, place, and time. She has normal strength. No cranial nerve deficit or sensory deficit.  Skin: Skin is warm, dry and intact. No rash noted.  Psychiatric: She has a normal mood and affect. Her speech is normal and behavior is normal.          Assessment & Plan:  1. Cough likelt due to post-nasal drainage from Allergic Rhinitis. - chlorpheniramine-HYDROcodone (TUSSIONEX PENNKINETIC ER) 10-8 MG/5ML LQCR; Take 5 mLs by mouth every 12 (twelve) hours as needed for cough (cough).  Dispense: 100 mL; Refill: 0  2. Allergic rhinitis Trial of Singulair. If no/little improvement, will add Atrovent nasal spray and refer to ENT for additional evaluation and treatment. - montelukast (SINGULAIR) 10 MG tablet; Take 1 tablet (10 mg total) by mouth at bedtime.  Dispense: 90 tablet; Refill: 3   Fara Chute, PA-C Physician Assistant-Certified [facility]

## 2013-11-05 ENCOUNTER — Other Ambulatory Visit: Payer: Self-pay | Admitting: Family Medicine

## 2013-11-16 ENCOUNTER — Other Ambulatory Visit: Payer: Self-pay | Admitting: Physician Assistant

## 2013-11-29 ENCOUNTER — Other Ambulatory Visit: Payer: Self-pay | Admitting: Family Medicine

## 2013-12-10 ENCOUNTER — Other Ambulatory Visit: Payer: Self-pay | Admitting: Family Medicine

## 2013-12-13 NOTE — Telephone Encounter (Signed)
Pt reported that she has been going to Dr Lamonte Sakai at [facility] who is trying to control her allergies. I advised that we can send her to an ENT if she would like, but she said that Dr Lamonte Sakai has [facility] nasal sprays, inhalers, etc and declined referral at this time. I suggested that he should be managing her cough med as part of overall therapy and will send RF req there. Pt agreed.

## 2014-01-10 ENCOUNTER — Ambulatory Visit (INDEPENDENT_AMBULATORY_CARE_PROVIDER_SITE_OTHER): Payer: No Typology Code available for payment source | Admitting: Internal Medicine

## 2014-01-10 ENCOUNTER — Ambulatory Visit (INDEPENDENT_AMBULATORY_CARE_PROVIDER_SITE_OTHER): Payer: No Typology Code available for payment source

## 2014-01-10 VITALS — BP 146/82 | HR 99 | Temp 98.5°F | Resp 20 | Ht 59.25 in | Wt 180.6 lb

## 2014-01-10 DIAGNOSIS — R05 Cough: Secondary | ICD-10-CM

## 2014-01-10 DIAGNOSIS — R0609 Other forms of dyspnea: Secondary | ICD-10-CM

## 2014-01-10 DIAGNOSIS — R0600 Dyspnea, unspecified: Secondary | ICD-10-CM

## 2014-01-10 DIAGNOSIS — R059 Cough, unspecified: Secondary | ICD-10-CM

## 2014-01-10 DIAGNOSIS — R0989 Other specified symptoms and signs involving the circulatory and respiratory systems: Secondary | ICD-10-CM

## 2014-01-10 DIAGNOSIS — R0602 Shortness of breath: Secondary | ICD-10-CM

## 2014-01-10 MED ORDER — AZITHROMYCIN 250 MG PO TABS: ORAL_TABLET | ORAL | Status: DC

## 2014-01-10 MED ORDER — HYDROCOD POLST-CHLORPHEN POLST 10-8 MG/5ML PO LQCR: 5.0000 mL | Freq: Two times a day (BID) | ORAL | Status: DC | PRN

## 2014-01-10 MED ORDER — METHYLPREDNISOLONE ACETATE 80 MG/ML IJ SUSP
80.0000 mg | Freq: Once | INTRAMUSCULAR | Status: AC
  Administered 2014-01-10: 80 mg via INTRAMUSCULAR

## 2014-01-10 NOTE — Progress Notes (Addendum)
Subjective:  This chart was scribed for Tami Lin, MD by Randa Evens, ED Scribe. This Patient was seen in room 11 and the patients care was started at 7:58 PM   Patient ID: Destiny Ballard, female    DOB: [birth year], 63 y.o.   MRN: [id number]  HPI Destiny Ballard is a 63 y.o. female Complaining of cough onset 1 week prior. She states she has an associated rhinorrhea, wheezing, diaphoresis, sleep disturbance, headache brought on by the cough, and vomiting brought on from coughing. She states she has some rib soreness. She denies fever or any other related symptoms.   Patient Active Problem List   Diagnosis Date Noted  . Allergic rhinitis 10/14/2013  . Other and unspecified noninfectious gastroenteritis and colitis 10/06/2012  . Dyspnea on exertion 09/02/2012  . Chest discomfort 09/02/2012  . Abscess of axilla 01/05/2012  . Cellulitis of axilla, left 01/05/2012  . Chest pain 12/10/2011  . HYPERLIPIDEMIA 05/11/2010  . MORBID OBESITY 05/11/2010  . HYPERTENSION 05/11/2010  . HEADACHE, CHRONIC 05/11/2010  . COUGH 05/11/2010  . ANEMIA-IRON DEFICIENCY 06/28/2008  . CORONARY ARTERY DISEASE 06/28/2008  . GERD 06/28/2008   Prior to Admission medications   Medication Sig Start Date End Date Taking? Authorizing Provider  amLODipine (NORVASC) 10 MG tablet TAKE 1 TABLET (10 MG TOTAL) BY MOUTH DAILY. 09/10/13  Yes Burtis Junes, NP  benzonatate (TESSALON) 100 MG capsule TAKE 1 or 2 CAPSULES BY MOUTH 2TIMES A DAY AS NEEDED FOR COUGH 09/22/13  Yes Robyn Haber, MD  chlorpheniramine-HYDROcodone [facility] PENNKINETIC ER) 10-8 MG/5ML LQCR Take 5 mLs by mouth every 12 (twelve) hours as needed for cough (cough). 10/14/13  Yes Chelle S Jeffery, PA-C  Cholecalciferol (VITAMIN D3) 1000 UNITS CAPS Take 1,000 Units by mouth daily.    Yes Historical Provider, MD  fexofenadine (ALLEGRA) 180 MG tablet Take 180 mg by mouth 2 (two) times daily.    Yes Historical Provider, MD  FLUoxetine (PROZAC)  40 MG capsule Take 1 capsule (40 mg total) by mouth daily. 09/10/13  Yes Burtis Junes, NP  fluticasone (VERAMYST) 27.5 MCG/SPRAY nasal spray Place 2 sprays into the nose as needed.    Yes Historical Provider, MD  ibuprofen (ADVIL,MOTRIN) 200 MG tablet Take 200 mg by mouth every 6 (six) hours as needed.   Yes Historical Provider, MD  Ibuprofen-Diphenhydramine Cit (MOTRIN PM PO) Take 38 mg by mouth as needed.   Yes Historical Provider, MD  metoprolol succinate (TOPROL-XL) 100 MG 24 hr tablet TAKE 1 TABLET BY MOUTH EVERY DAY 09/10/13  Yes Burtis Junes, NP  montelukast (SINGULAIR) 10 MG tablet Take 1 tablet (10 mg total) by mouth at bedtime. 10/14/13  Yes Chelle S Jeffery, PA-C  omeprazole (PRILOSEC) 40 MG capsule TAKE 1 CAPSULE (40 MG TOTAL) BY MOUTH DAILY. 09/10/13  Yes Burtis Junes, NP  PROVENTIL HFA 108 (90 BASE) MCG/ACT inhaler SHAKE WELL AND INHALE 1 PUFF INTO THE LUNGS EVERY 6 HOURS AS NEEDED FOR SHORTNESS OF BREATH 06/12/13  Yes Mancel Bale, PA-C  rosuvastatin (CRESTOR) 20 MG tablet TAKE 1 TABLET (20 MG TOTAL) BY MOUTH DAILY. 09/10/13  Yes Burtis Junes, NP  nitroGLYCERIN (NITROSTAT) 0.4 MG SL tablet Place 0.4 mg under the tongue every 5 (five) minutes as needed. 12/11/11 10/14/13  Dayna N Dunn, PA-C   Nonsmoker  Review of Systems  Constitutional: Positive for diaphoresis. Negative for fever.  HENT: Positive for rhinorrhea.   Respiratory: Positive for cough and wheezing.  Gastrointestinal: Positive for vomiting.  Neurological: Positive for headaches.  Psychiatric/Behavioral: Positive for sleep disturbance.    Objective:    Physical Exam  Nursing note and vitals reviewed. Constitutional: No distress.  HENT:  Right Ear: External ear normal.  Left Ear: External ear normal.  Nose: Nose normal.  Mouth/Throat: Oropharynx is clear and moist.  Eyes: Conjunctivae are normal. Pupils are equal, round, and reactive to light.  Neck: No thyromegaly present.  Cardiovascular: Normal rate,  regular rhythm and normal heart sounds.   Pulmonary/Chest: Effort normal. She has no rales.  Shallow inspiration and wheezing with cough, no rales  Musculoskeletal: She exhibits no edema.  No peripheral edema  Skin: Skin is warm and dry.  Psychiatric: She has a normal mood and affect. Her behavior is normal.   [facility] reading (PRIMARY) by  Dr. Laney Pastor no infiltrate or effusion   Assessment & Plan:  Reactive airway disease from lower respiratory infection in the setting of probable chronic lung disease although this has never been diagnosed/// She can't tolerate oral steroids but does well withDepo-Medrol She would prefer Tussionex   She will follow up when well for pulmonary function testing  Meds ordered this encounter  Medications  . methylPREDNISolone acetate (DEPO-MEDROL) injection 80 mg    Sig:   . chlorpheniramine-HYDROcodone (TUSSIONEX PENNKINETIC ER) 10-8 MG/5ML LQCR    Sig: Take 5 mLs by mouth every 12 (twelve) hours as needed for cough (cough).    Dispense:  100 mL    Refill:  0    Order Specific Question:  Supervising Provider    Answer:  Kyndahl Jablon P [[id number]]  . azithromycin (ZITHROMAX) 250 MG tablet    Sig: As packaged    Dispense:  6 tablet    Refill:  0       I have completed the patient encounter in its entirety as documented by the scribe, with editing by me where necessary. Toryn Mcclinton P. Laney Pastor, M.D.

## 2014-01-11 ENCOUNTER — Encounter: Payer: Self-pay | Admitting: Internal Medicine

## 2014-01-19 ENCOUNTER — Ambulatory Visit (INDEPENDENT_AMBULATORY_CARE_PROVIDER_SITE_OTHER): Payer: No Typology Code available for payment source | Admitting: Emergency Medicine

## 2014-01-19 ENCOUNTER — Encounter: Payer: Self-pay | Admitting: Emergency Medicine

## 2014-01-19 VITALS — BP 128/82 | HR 85 | Ht 60.0 in | Wt 180.0 lb

## 2014-01-19 DIAGNOSIS — R059 Cough, unspecified: Secondary | ICD-10-CM

## 2014-01-19 DIAGNOSIS — R05 Cough: Secondary | ICD-10-CM

## 2014-01-19 MED ORDER — HYDROCOD POLST-CHLORPHEN POLST 10-8 MG/5ML PO LQCR: 5.0000 mL | Freq: Two times a day (BID) | ORAL | Status: DC | PRN

## 2014-01-19 MED ORDER — BENZONATATE 100 MG PO CAPS: ORAL_CAPSULE | ORAL | Status: DC

## 2014-01-19 NOTE — Patient Instructions (Signed)
Please continue your allegra daily, singulair each evening Start doing your veramyst every day Start using omeprazole every day Try to start doing nasal saline washes every day Use tussionex 5cc every 12 hours as needed for cough Use tessalon perles up to every 6 hours if needed for cough Follow with Dr Lamonte Sakai in 4 months or sooner if you have any problems.

## 2014-01-19 NOTE — Progress Notes (Signed)
HPI: 63 yo never smoker, hx CAD s/p PTCI, HTN, GERD, HA's, depression. Has been seen here for dyspnea and cough in the past by DS. She knows that she has some allergies that impact her cough, ? dogs, ? second-hand smoke. Last seen by me in 2011. She was seen by Dr Gwenette Greet 08/2012 for chest discomfort and dyspnea, tightness. She had low-risk stress test 11/2011. Also with chronic cough, chronic rhinitis, GERD. 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 Stopped allegra and started chlorpheniramine, is using veramyst prn. She has been requiring tessalon perles frequently.   ROV 10/06/12 -- follows up for cough, probably mixed disease on spirometry (mild). Last time restarted allegra, continued chlorpheniramine, scheduled veramyst. Also started omeprazole. She is better - her cough is improved. She is still using the veramyst prn. She is complaining of bloating, abd pain, emesis x 3 days, diarrhea.   ROV  01/19/14 -- follow up for cough, MMP as above. Her mild spiro shows mixed disease. She has had trouble with allergies and more cough. She still has her animals in the home. She can barely sleep, can't get through the night. She is on omeprazole prn, allegra qd, singulair qhs, veramyst qhs,   Filed Vitals:   01/19/14 1645  BP: 128/82  Pulse: 85  Height: 5' (1.524 m)  Weight: 180 lb (81.647 kg)  SpO2: 93%   Gen: Pleasant, overwt, in no distress,  normal affect  ENT: No lesions,  mouth clear,  oropharynx clear, no postnasal drip  Neck: No JVD, no TMG, no carotid bruits  Lungs: No use of accessory muscles, no dullness to percussion, clear without rales or rhonchi  Cardiovascular: RRR, heart sounds normal, no murmur or gallops, no peripheral edema  Musculoskeletal: No deformities, no cyanosis or clubbing  Neuro: alert, non focal  Skin: Warm, no lesions or rashes   PULMONARY FUNCTON  TEST 03/23/2012 08/28/2012 09/07/2012  Peak Flow 200    FVC  1.77 2.74  FEV1  1.32 2.1  FEV1/FVC  74.6 76.6  FVC  % Predicted   62  FEV % Predicted   64  FeF 25-75   1.98  FeF 25-75 % Predicted   1.26   COUGH Please continue your allegra daily, singulair each evening Start doing your veramyst every day Start using omeprazole every day Try to start doing nasal saline washes every day Use tussionex 5cc every 12 hours as needed for cough Use tessalon perles up to every 6 hours if needed for cough Follow with Dr Lamonte Sakai in 4 months or sooner if you have any problems.

## 2014-01-19 NOTE — Assessment & Plan Note (Signed)
Please continue your allegra daily, singulair each evening Start doing your veramyst every day Start using omeprazole every day Try to start doing nasal saline washes every day Use tussionex 5cc every 12 hours as needed for cough Use tessalon perles up to every 6 hours if needed for cough Follow with Dr Lamonte Sakai in 4 months or sooner if you have any problems.

## 2014-02-17 ENCOUNTER — Ambulatory Visit (INDEPENDENT_AMBULATORY_CARE_PROVIDER_SITE_OTHER): Payer: No Typology Code available for payment source | Admitting: General Surgery

## 2014-02-17 ENCOUNTER — Encounter (INDEPENDENT_AMBULATORY_CARE_PROVIDER_SITE_OTHER): Payer: Self-pay | Admitting: General Surgery

## 2014-02-17 ENCOUNTER — Ambulatory Visit (INDEPENDENT_AMBULATORY_CARE_PROVIDER_SITE_OTHER): Payer: No Typology Code available for payment source | Admitting: Family Medicine

## 2014-02-17 VITALS — BP 138/72 | HR 95 | Temp 98.3°F | Resp 18 | Ht 59.0 in | Wt 183.0 lb

## 2014-02-17 VITALS — BP 132/84 | HR 82 | Temp 98.0°F | Resp 18 | Ht 60.0 in | Wt 176.0 lb

## 2014-02-17 DIAGNOSIS — K610 Anal abscess: Secondary | ICD-10-CM

## 2014-02-17 DIAGNOSIS — K612 Anorectal abscess: Secondary | ICD-10-CM | POA: Insufficient documentation

## 2014-02-17 MED ORDER — SULFAMETHOXAZOLE-TRIMETHOPRIM 800-160 MG PO TABS: 1.0000 | ORAL_TABLET | Freq: Two times a day (BID) | ORAL | Status: DC

## 2014-02-17 MED ORDER — HYDROCODONE-ACETAMINOPHEN 5-325 MG PO TABS: 1.0000 | ORAL_TABLET | Freq: Three times a day (TID) | ORAL | Status: DC | PRN

## 2014-02-17 NOTE — Progress Notes (Signed)
[facility] [address], [place] [address] [phone]  Date:  02/17/2014   Name:  Destiny Ballard   DOB:  15-Apr-1951   MRN:  [id number]  PCP:  Lamar Blinks, MD    Chief Complaint: Recurrent Skin Infections   History of Present Illness:  Destiny Ballard is a 63 y.o. very pleasant female patient who presents with the following:  History of recurrent MRSA abscesses.  Today is Thursday- she noted an abscess on the right buttock this past Sunday, more painful over the last 2 days.  She has not noted a definite fever, but has felt tired.  She is still able to pass stools.  The area is draining pus She has had an abscess in this area in the past but never this severe  Patient Active Problem List   Diagnosis Date Noted  . Allergic rhinitis 10/14/2013  . Other and unspecified noninfectious gastroenteritis and colitis 10/06/2012  . Dyspnea on exertion 09/02/2012  . Chest discomfort 09/02/2012  . Abscess of axilla 01/05/2012  . Cellulitis of axilla, left 01/05/2012  . Chest pain 12/10/2011  . HYPERLIPIDEMIA 05/11/2010  . MORBID OBESITY 05/11/2010  . HYPERTENSION 05/11/2010  . HEADACHE, CHRONIC 05/11/2010  . COUGH 05/11/2010  . ANEMIA-IRON DEFICIENCY 06/28/2008  . CORONARY ARTERY DISEASE 06/28/2008  . GERD 06/28/2008    Past Medical History  Diagnosis Date  . Hypertension   . Hyperlipidemia   . Obesity   . Anxiety     Prior suicide attempt  . Depression   . Insulin resistance   . Iron deficiency anemia   . GERD (gastroesophageal reflux disease)   . Cervical spondylosis   . CAD (coronary artery disease)     a) s/p DES to LAD 07/2005 b) Last Myoview low risk 11/2011 showing small fixed apical perfusion defect (prior MI vs attenuation) but no ischemia - normal EF.    Past Surgical History  Procedure Laterality Date  . Cardiac catheterization  06/17/2007    NORMAL. EF 60%  . Coronary stent placement  07/2005    LEFT ANTERIOR DESCENDING  .  Tubal ligation    . Tonsillectomy and adenoidectomy    . Childbirth      X3  . Breast enhancement surgery    . Cervical spondylosis      SINGLE LEVEL FUSION  . Incision and drainage breast abscess  01/05/2012       . Forearm fracture surgery  2010    hand and shoulder     History  Substance Use Topics  . Smoking status: Never Smoker   . Smokeless tobacco: Never Used  . Alcohol Use: 0.6 oz/week    1 Glasses of wine per week     Comment: social    Family History  Problem Relation Age of Onset  . Heart attack Mother   . Diabetes Mother   . Lung cancer Mother   . Asthma Mother   . Heart disease Mother   . Cancer Mother   . Suicidality Father   . Allergies Other     all family--seasonal allergies  . Asthma Daughter   . Cancer Daughter     pre-cancerous polyp  . Diabetes Sister   . Cancer Sister   . Asthma Daughter   . Cancer Daughter     cervical     Allergies  Allergen Reactions  . Prednisone     REACTION: mood swings, nightmares. "Shot doesn't bother me, reaction is just with  the pill"    Medication list has been reviewed and updated.  Current Outpatient Prescriptions on File Prior to Visit  Medication Sig Dispense Refill  . amLODipine (NORVASC) 10 MG tablet TAKE 1 TABLET (10 MG TOTAL) BY MOUTH DAILY.  90 tablet  3  . benzonatate (TESSALON) 100 MG capsule TAKE 1 or 2 CAPSULES BY MOUTH 2TIMES A DAY AS NEEDED FOR COUGH  180 capsule  1  . chlorpheniramine-HYDROcodone (TUSSIONEX PENNKINETIC ER) 10-8 MG/5ML LQCR Take 5 mLs by mouth every 12 (twelve) hours as needed for cough (cough).  100 mL  0  . Cholecalciferol (VITAMIN D3) 1000 UNITS CAPS Take 1,000 Units by mouth daily.       . fexofenadine (ALLEGRA) 180 MG tablet Take 180 mg by mouth 2 (two) times daily.       [name] FLUoxetine (PROZAC) 40 MG capsule Take 1 capsule (40 mg total) by mouth daily.  90 capsule  3  . fluticasone (VERAMYST) 27.5 MCG/SPRAY nasal spray Place 2 sprays into the nose as needed.       [name]  ibuprofen (ADVIL,MOTRIN) 200 MG tablet Take 200 mg by mouth every 6 (six) hours as needed.      . Ibuprofen-Diphenhydramine Cit (MOTRIN PM PO) Take 38 mg by mouth as needed.      . metoprolol succinate (TOPROL-XL) 100 MG 24 hr tablet TAKE 1 TABLET BY MOUTH EVERY DAY  90 tablet  3  . montelukast (SINGULAIR) 10 MG tablet Take 1 tablet (10 mg total) by mouth at bedtime.  90 tablet  3  . nitroGLYCERIN (NITROSTAT) 0.4 MG SL tablet Place 0.4 mg under the tongue every 5 (five) minutes as needed.      [name] omeprazole (PRILOSEC) 40 MG capsule TAKE 1 CAPSULE (40 MG TOTAL) BY MOUTH DAILY.  90 capsule  3  . PROVENTIL HFA 108 (90 BASE) MCG/ACT inhaler SHAKE WELL AND INHALE 1 PUFF INTO THE LUNGS EVERY 6 HOURS AS NEEDED FOR SHORTNESS OF BREATH  6.7 each  0  . rosuvastatin (CRESTOR) 20 MG tablet TAKE 1 TABLET (20 MG TOTAL) BY MOUTH DAILY.  90 tablet  3   No current facility-administered medications on file prior to visit.    Review of Systems:  As per HPI- otherwise negative.   Physical Examination: Filed Vitals:   02/17/14 1150  BP: 138/72  Pulse: 95  Temp: 98.3 F (36.8 C)  Resp: 18   Filed Vitals:   02/17/14 1150  Height: 4\' 11"  (1.499 m)  Weight: 183 lb (83.008 kg)   Body mass index is 36.94 kg/(m^2). Ideal Body Weight: Weight in (lb) to have BMI = 25: 123.5   GEN: WDWN, NAD, Non-toxic, Alert & Oriented x 3, obese, looks well HEENT: Atraumatic, Normocephalic.  Ears and Nose: No external deformity. RRR, no MRG Lungs are CTA EXTR: No clubbing/cyanosis/edema NEURO: Normal gait.  PSYCH: Normally interactive. Conversant. Not depressed or anxious appearing.  Calm demeanor.  There is a large perirectal abscess on the right buttock.  It is draining near the anus.  DRE is negative for any apparent fistula.  Abscess is approx 3in by 6 in in size.    Assessment and Plan: Abscess, perianal - Plan: HYDROcodone-acetaminophen (NORCO/VICODIN) 5-325 MG per tablet  Large peri- anal abscess which may  be best treated by a surgeon in case of complication.   She will be seen at [facility] this afternoon vicodin as needed for pain  Meds ordered this encounter  Medications  . HYDROcodone-acetaminophen (NORCO/VICODIN) 5-325  MG per tablet    Sig: Take 1 tablet by mouth every 8 (eight) hours as needed.    Dispense:  20 tablet    Refill:  0     Signed Lamar Blinks, MD

## 2014-02-17 NOTE — Progress Notes (Signed)
Patient ID: Destiny Ballard, female   DOB: [birth year], 63 y.o.   MRN: [id number]  Chief Complaint  Patient presents with  . Follow-up    peri. abscess    HPI Destiny Ballard is a 63 y.o. female.   HPI  She is referred by Dr. Lorelei Pont because of a perirectal abscess that has become spontaneously draining.  She is noted some swelling in the area last weekend. She began having spontaneous drainage however the area continued to get larger. She saw Dr. Edilia Bo who referred her over here. She states she's had an abscess in that area in the past, she thinks.  Past Medical History  Diagnosis Date  . Hypertension   . Hyperlipidemia   . Obesity   . Anxiety     Prior suicide attempt  . Depression   . Insulin resistance   . Iron deficiency anemia   . GERD (gastroesophageal reflux disease)   . Cervical spondylosis   . CAD (coronary artery disease)     a) s/p DES to LAD 07/2005 b) Last Myoview low risk 11/2011 showing small fixed apical perfusion defect (prior MI vs attenuation) but no ischemia - normal EF.    Past Surgical History  Procedure Laterality Date  . Cardiac catheterization  06/17/2007    NORMAL. EF 60%  . Coronary stent placement  07/2005    LEFT ANTERIOR DESCENDING  . Tubal ligation    . Tonsillectomy and adenoidectomy    . Childbirth      X3  . Breast enhancement surgery    . Cervical spondylosis      SINGLE LEVEL FUSION  . Incision and drainage breast abscess  01/05/2012       . Forearm fracture surgery  2010    hand and shoulder     Family History  Problem Relation Age of Onset  . Heart attack Mother   . Diabetes Mother   . Lung cancer Mother   . Asthma Mother   . Heart disease Mother   . Cancer Mother   . Suicidality Father   . Allergies Other     all family--seasonal allergies  . Asthma Daughter   . Cancer Daughter     pre-cancerous polyp  . Diabetes Sister   . Cancer Sister   . Asthma Daughter   . Cancer Daughter     cervical     Social  History History  Substance Use Topics  . Smoking status: Never Smoker   . Smokeless tobacco: Never Used  . Alcohol Use: 0.6 oz/week    1 Glasses of wine per week     Comment: social    Allergies  Allergen Reactions  . Prednisone     REACTION: mood swings, nightmares. "Shot doesn't bother me, reaction is just with the pill"    Current Outpatient Prescriptions  Medication Sig Dispense Refill  . amLODipine (NORVASC) 10 MG tablet TAKE 1 TABLET (10 MG TOTAL) BY MOUTH DAILY.  90 tablet  3  . benzonatate (TESSALON) 100 MG capsule TAKE 1 or 2 CAPSULES BY MOUTH 2TIMES A DAY AS NEEDED FOR COUGH  180 capsule  1  . Cholecalciferol (VITAMIN D3) 1000 UNITS CAPS Take 1,000 Units by mouth daily.       . fexofenadine (ALLEGRA) 180 MG tablet Take 180 mg by mouth 2 (two) times daily.       [name] FLUoxetine (PROZAC) 40 MG capsule Take 1 capsule (40 mg total) by mouth daily.  90 capsule  3  .  fluticasone (VERAMYST) 27.5 MCG/SPRAY nasal spray Place 2 sprays into the nose as needed.       [name] HYDROcodone-acetaminophen (NORCO/VICODIN) 5-325 MG per tablet Take 1 tablet by mouth every 8 (eight) hours as needed.  20 tablet  0  . ibuprofen (ADVIL,MOTRIN) 200 MG tablet Take 200 mg by mouth every 6 (six) hours as needed.      . Ibuprofen-Diphenhydramine Cit (MOTRIN PM PO) Take 38 mg by mouth as needed.      . metoprolol succinate (TOPROL-XL) 100 MG 24 hr tablet TAKE 1 TABLET BY MOUTH EVERY DAY  90 tablet  3  . montelukast (SINGULAIR) 10 MG tablet Take 1 tablet (10 mg total) by mouth at bedtime.  90 tablet  3  . nitroGLYCERIN (NITROSTAT) 0.4 MG SL tablet Place 0.4 mg under the tongue every 5 (five) minutes as needed.      [name] omeprazole (PRILOSEC) 40 MG capsule TAKE 1 CAPSULE (40 MG TOTAL) BY MOUTH DAILY.  90 capsule  3  . PROVENTIL HFA 108 (90 BASE) MCG/ACT inhaler SHAKE WELL AND INHALE 1 PUFF INTO THE LUNGS EVERY 6 HOURS AS NEEDED FOR SHORTNESS OF BREATH  6.7 each  0  . rosuvastatin (CRESTOR) 20 MG tablet TAKE 1 TABLET  (20 MG TOTAL) BY MOUTH DAILY.  90 tablet  3  . sulfamethoxazole-trimethoprim (BACTRIM DS,SEPTRA DS) 800-160 MG per tablet Take 1 tablet by mouth 2 (two) times daily.  20 tablet  0   No current facility-administered medications for this visit.    Review of Systems Review of Systems  Constitutional: Positive for chills. Negative for fever.  Cardiovascular: Negative for chest pain.  Gastrointestinal: Positive for constipation.    Blood pressure 132/84, pulse 82, temperature 98 F (36.7 C), resp. rate 18, height 5' (1.524 m), weight 176 lb (79.833 kg).  Physical Exam Physical Exam  Constitutional: No distress.  Overweight female  HENT:  Head: Normocephalic and atraumatic.  Cardiovascular: Normal rate and regular rhythm.   Pulmonary/Chest: Effort normal and breath sounds normal.  Genitourinary:  There is a right perianal abscess with spontaneous drainage of some purulent material underneath an eschar. This tracks up superiorly into the right buttock area where there is a smaller opening draining purulent material. This measures approximately 8-9 cm. There is surrounding induration and erythema. It is very tender to the touch.  Neurological: She is alert.  Psychiatric: She has a normal mood and affect. Her behavior is normal.    Data Reviewed Note from Dr. Lorelei Pont  Assessment    Complex anorectal abscess that is spontaneously draining but is incompletely drained. She is not toxic.     Plan    Start Bactrim DS tonight.  Incision and drainage in the operating room tomorrow by Dr. Hassell Done. I discussed this with her and with him. We went over the procedure and the risks. Risks include but are not limited to bleeding, recurrent infection, wound healing problems, need for reoperation, risks of anesthesia. She seems to understand and agrees with the plan.        Daleena Rotter J 02/17/2014, 5:51 PM

## 2014-02-17 NOTE — Patient Instructions (Addendum)
You will see Dr. Barkley Bruns at [place] at [facility]  Address: [address] [place], [place] [address]  [phone] I hope that you feel much better soon!   Use the pain medication as needed but do not drive after taking it.

## 2014-02-17 NOTE — Patient Instructions (Signed)
Nothing to eat or drink after midnight. Dr. Hassell Done will drain your abscess tomorrow in the operating room at [facility].

## 2014-02-18 ENCOUNTER — Ambulatory Visit (HOSPITAL_COMMUNITY)
Admission: RE | Admit: 2014-02-18 | Discharge: 2014-02-18 | Disposition: A | Discharge status: Home / Self Care | Payer: No Typology Code available for payment source | Source: Ambulatory Visit | Attending: Surgery | Admitting: Surgery

## 2014-02-18 ENCOUNTER — Encounter (HOSPITAL_COMMUNITY): Payer: Self-pay | Admitting: *Deleted

## 2014-02-18 ENCOUNTER — Encounter (HOSPITAL_COMMUNITY)
Admission: RE | Disposition: A | Discharge status: Home / Self Care | Payer: Self-pay | Source: Ambulatory Visit | Attending: Surgery

## 2014-02-18 ENCOUNTER — Encounter (HOSPITAL_COMMUNITY): Payer: No Typology Code available for payment source | Admitting: Anesthesiology

## 2014-02-18 ENCOUNTER — Inpatient Hospital Stay (HOSPITAL_COMMUNITY): Payer: No Typology Code available for payment source | Admitting: Anesthesiology

## 2014-02-18 DIAGNOSIS — E669 Obesity, unspecified: Secondary | ICD-10-CM | POA: Diagnosis not present

## 2014-02-18 DIAGNOSIS — I10 Essential (primary) hypertension: Secondary | ICD-10-CM | POA: Insufficient documentation

## 2014-02-18 DIAGNOSIS — F341 Dysthymic disorder: Secondary | ICD-10-CM | POA: Insufficient documentation

## 2014-02-18 DIAGNOSIS — K219 Gastro-esophageal reflux disease without esophagitis: Secondary | ICD-10-CM | POA: Diagnosis not present

## 2014-02-18 DIAGNOSIS — K612 Anorectal abscess: Secondary | ICD-10-CM

## 2014-02-18 HISTORY — PX: INCISION AND DRAINAGE PERIRECTAL ABSCESS: SHX1804

## 2014-02-18 LAB — CBC WITH DIFFERENTIAL/PLATELET
Basophils Absolute: 0 10*3/uL (ref 0.0–0.1)
Basophils Relative: 0 % (ref 0–1)
Eosinophils Absolute: 0.2 10*3/uL (ref 0.0–0.7)
Eosinophils Relative: 2 % (ref 0–5)
HCT: 32.2 % — ABNORMAL LOW (ref 36.0–46.0)
Hemoglobin: 10.4 g/dL — ABNORMAL LOW (ref 12.0–15.0)
Lymphocytes Relative: 18 % (ref 12–46)
Lymphs Abs: 2.1 10*3/uL (ref 0.7–4.0)
MCH: 25.6 pg — ABNORMAL LOW (ref 26.0–34.0)
MCHC: 32.3 g/dL (ref 30.0–36.0)
MCV: 79.1 fL (ref 78.0–100.0)
Monocytes Absolute: 1 10*3/uL (ref 0.1–1.0)
Monocytes Relative: 8 % (ref 3–12)
Neutro Abs: 8.4 10*3/uL — ABNORMAL HIGH (ref 1.7–7.7)
Neutrophils Relative %: 72 % (ref 43–77)
Platelets: 246 10*3/uL (ref 150–400)
RBC: 4.07 MIL/uL (ref 3.87–5.11)
RDW: 14.4 % (ref 11.5–15.5)
WBC: 11.6 10*3/uL — ABNORMAL HIGH (ref 4.0–10.5)

## 2014-02-18 LAB — COMPREHENSIVE METABOLIC PANEL
ALT: 65 U/L — ABNORMAL HIGH (ref 0–35)
AST: 26 U/L (ref 0–37)
Albumin: 2.9 g/dL — ABNORMAL LOW (ref 3.5–5.2)
Alkaline Phosphatase: 146 U/L — ABNORMAL HIGH (ref 39–117)
Anion gap: 12 (ref 5–15)
BUN: 15 mg/dL (ref 6–23)
CO2: 29 mEq/L (ref 19–32)
Calcium: 9.2 mg/dL (ref 8.4–10.5)
Chloride: 97 mEq/L (ref 96–112)
Creatinine, Ser: 0.65 mg/dL (ref 0.50–1.10)
GFR calc Af Amer: >90 mL/min (ref >90)
GFR calc non Af Amer: >90 mL/min (ref >90)
Glucose, Bld: 169 mg/dL — ABNORMAL HIGH (ref 70–99)
Potassium: 3.3 mEq/L — ABNORMAL LOW (ref 3.7–5.3)
Sodium: 138 mEq/L (ref 137–147)
Total Bilirubin: <0.2 mg/dL — ABNORMAL LOW (ref 0.3–1.2)
Total Protein: 7.3 g/dL (ref 6.0–8.3)

## 2014-02-18 LAB — SURGICAL PCR SCREEN
MRSA, PCR: NEGATIVE
Staphylococcus aureus: NEGATIVE

## 2014-02-18 SURGERY — INCISION AND DRAINAGE, ABSCESS, PERIRECTAL
Anesthesia: General | Site: Perineum

## 2014-02-18 MED ORDER — KETOROLAC TROMETHAMINE 30 MG/ML IJ SOLN
INTRAMUSCULAR | Status: AC
  Filled 2014-02-18: qty 1

## 2014-02-18 MED ORDER — OXYCODONE HCL 5 MG/5ML PO SOLN
5.0000 mg | Freq: Once | ORAL | Status: DC | PRN
  Filled 2014-02-18: qty 5

## 2014-02-18 MED ORDER — ALBUTEROL SULFATE HFA 108 (90 BASE) MCG/ACT IN AERS
INHALATION_SPRAY | RESPIRATORY_TRACT | Status: DC | PRN
  Administered 2014-02-18: 4 via RESPIRATORY_TRACT

## 2014-02-18 MED ORDER — HYDROMORPHONE HCL PF 1 MG/ML IJ SOLN
0.2500 mg | INTRAMUSCULAR | Status: DC | PRN
  Administered 2014-02-18: 0.25 mg via INTRAVENOUS
  Administered 2014-02-18: 0.5 mg via INTRAVENOUS
  Administered 2014-02-18: 0.25 mg via INTRAVENOUS

## 2014-02-18 MED ORDER — KETOROLAC TROMETHAMINE 30 MG/ML IJ SOLN
30.0000 mg | Freq: Once | INTRAMUSCULAR | Status: AC
  Administered 2014-02-18: 30 mg via INTRAVENOUS

## 2014-02-18 MED ORDER — PHENYLEPHRINE HCL 10 MG/ML IJ SOLN
INTRAMUSCULAR | Status: DC | PRN
  Administered 2014-02-18 (×3): 80 ug via INTRAVENOUS

## 2014-02-18 MED ORDER — MIDAZOLAM HCL 5 MG/5ML IJ SOLN
INTRAMUSCULAR | Status: DC | PRN
  Administered 2014-02-18: 2 mg via INTRAVENOUS

## 2014-02-18 MED ORDER — PROPOFOL 10 MG/ML IV BOLUS
INTRAVENOUS | Status: DC | PRN
  Administered 2014-02-18: 140 mg via INTRAVENOUS

## 2014-02-18 MED ORDER — MEPERIDINE HCL 50 MG/ML IJ SOLN: 6.2500 mg | INTRAMUSCULAR | Status: DC | PRN

## 2014-02-18 MED ORDER — DEXAMETHASONE SODIUM PHOSPHATE 10 MG/ML IJ SOLN
INTRAMUSCULAR | Status: DC | PRN
  Administered 2014-02-18: 10 mg via INTRAVENOUS

## 2014-02-18 MED ORDER — PROPOFOL 10 MG/ML IV BOLUS
INTRAVENOUS | Status: AC
  Filled 2014-02-18: qty 20

## 2014-02-18 MED ORDER — LACTATED RINGERS IV SOLN
INTRAVENOUS | Status: DC
  Administered 2014-02-18: 1000 mL via INTRAVENOUS

## 2014-02-18 MED ORDER — LIDOCAINE HCL (CARDIAC) 20 MG/ML IV SOLN
INTRAVENOUS | Status: AC
  Filled 2014-02-18: qty 5

## 2014-02-18 MED ORDER — ALBUTEROL SULFATE HFA 108 (90 BASE) MCG/ACT IN AERS
INHALATION_SPRAY | RESPIRATORY_TRACT | Status: AC
  Filled 2014-02-18: qty 6.7

## 2014-02-18 MED ORDER — DEXAMETHASONE SODIUM PHOSPHATE 10 MG/ML IJ SOLN
INTRAMUSCULAR | Status: AC
  Filled 2014-02-18: qty 1

## 2014-02-18 MED ORDER — DEXTROSE 5 % IV SOLN
2.0000 g | INTRAVENOUS | Status: AC
  Administered 2014-02-18: 2 g via INTRAVENOUS

## 2014-02-18 MED ORDER — HYDROCODONE-ACETAMINOPHEN 5-325 MG PO TABS: 1.0000 | ORAL_TABLET | ORAL | Status: DC | PRN

## 2014-02-18 MED ORDER — MIDAZOLAM HCL 2 MG/2ML IJ SOLN
INTRAMUSCULAR | Status: AC
  Filled 2014-02-18: qty 2

## 2014-02-18 MED ORDER — FENTANYL CITRATE 0.05 MG/ML IJ SOLN
INTRAMUSCULAR | Status: AC
  Filled 2014-02-18: qty 5

## 2014-02-18 MED ORDER — OXYCODONE HCL 5 MG PO TABS: 5.0000 mg | ORAL_TABLET | Freq: Once | ORAL | Status: DC | PRN

## 2014-02-18 MED ORDER — DEXTROSE 5 % IV SOLN
INTRAVENOUS | Status: AC
  Filled 2014-02-18: qty 2

## 2014-02-18 MED ORDER — HYDROMORPHONE HCL PF 1 MG/ML IJ SOLN
INTRAMUSCULAR | Status: AC
  Filled 2014-02-18: qty 1

## 2014-02-18 MED ORDER — PHENYLEPHRINE 40 MCG/ML (10ML) SYRINGE FOR IV PUSH (FOR BLOOD PRESSURE SUPPORT)
PREFILLED_SYRINGE | INTRAVENOUS | Status: AC
  Filled 2014-02-18: qty 10

## 2014-02-18 MED ORDER — ONDANSETRON HCL 4 MG/2ML IJ SOLN
INTRAMUSCULAR | Status: DC | PRN
  Administered 2014-02-18: 4 mg via INTRAVENOUS

## 2014-02-18 MED ORDER — LIDOCAINE HCL (CARDIAC) 20 MG/ML IV SOLN
INTRAVENOUS | Status: DC | PRN
  Administered 2014-02-18 (×2): 100 mg via INTRAVENOUS

## 2014-02-18 MED ORDER — SUCCINYLCHOLINE CHLORIDE 20 MG/ML IJ SOLN
INTRAMUSCULAR | Status: DC | PRN
  Administered 2014-02-18: 100 mg via INTRAVENOUS

## 2014-02-18 MED ORDER — PROMETHAZINE HCL 25 MG/ML IJ SOLN: 6.2500 mg | INTRAMUSCULAR | Status: DC | PRN

## 2014-02-18 MED ORDER — FENTANYL CITRATE 0.05 MG/ML IJ SOLN
INTRAMUSCULAR | Status: DC | PRN
  Administered 2014-02-18: 50 ug via INTRAVENOUS

## 2014-02-18 SURGICAL SUPPLY — 17 items
BLADE SURG SZ20 CARB STEEL (BLADE) ×2 IMPLANT
DRAPE LG THREE QUARTER DISP (DRAPES) ×2 IMPLANT
GAUZE IODOFORM PACK 1/2 7832 (GAUZE/BANDAGES/DRESSINGS) ×2 IMPLANT
GAUZE SPONGE 4X4 12PLY STRL (GAUZE/BANDAGES/DRESSINGS) ×2 IMPLANT
GAUZE SPONGE 4X4 16PLY XRAY LF (GAUZE/BANDAGES/DRESSINGS) ×2 IMPLANT
GLOVE BIOGEL M 8.0 STRL (GLOVE) ×2 IMPLANT
GLOVE BIOGEL PI IND STRL 7.0 (GLOVE) ×2 IMPLANT
GLOVE BIOGEL PI INDICATOR 7.0 (GLOVE) ×2
GOWN SPEC L4 XLG W/TWL (GOWN DISPOSABLE) ×2 IMPLANT
GOWN STRL REUS W/TWL LRG LVL3 (GOWN DISPOSABLE) ×2 IMPLANT
GOWN STRL REUS W/TWL XL LVL3 (GOWN DISPOSABLE) ×6 IMPLANT
KIT BASIN OR (CUSTOM PROCEDURE TRAY) ×2 IMPLANT
PACK LITHOTOMY IV (CUSTOM PROCEDURE TRAY) ×2 IMPLANT
PENCIL BUTTON HOLSTER BLD 10FT (ELECTRODE) ×2 IMPLANT
TOWEL OR 17X26 10 PK STRL BLUE (TOWEL DISPOSABLE) ×2 IMPLANT
TUBE ANAEROBIC SPECIMEN COL (MISCELLANEOUS) IMPLANT
YANKAUER SUCT BULB TIP 10FT TU (MISCELLANEOUS) ×4 IMPLANT

## 2014-02-18 NOTE — Anesthesia Postprocedure Evaluation (Signed)
Anesthesia Post Note  Patient: Destiny Ballard  Procedure(s) Performed: Procedure(s) (LRB): IRRIGATION AND DEBRIDEMENT PERIRECTAL ABSCESS (N/A)  Anesthesia type: General  Patient location: PACU  Post pain: Pain level controlled  Post assessment: Post-op Vital signs reviewed  Last Vitals: BP 101/67  Pulse 81  Temp(Src) 36 C  Resp 22  SpO2 90%  Post vital signs: Reviewed  Level of consciousness: sedated  Complications: No apparent anesthesia complications

## 2014-02-18 NOTE — Progress Notes (Signed)
Dr Christian Mate notified pt sats fluctuate from 80's to 90s'. MD came to bedside and assessed pt.  PT expressed desire to go home, reports breathing feels "normal".  Husband at bedside and said breathing is as it is at home.  Lungs are clear.  Pt using spirometer frequently and is self motivated.  Pt gave self inhaler.  MD authorized relaese to short stay.  MD reitierated pt to return to ER if any breathing difficulties.

## 2014-02-18 NOTE — Op Note (Signed)
Surgeon: Kaylyn Lim, MD, FACS  Asst:  none  Anes:  general  Procedure: Incision and drainage of right perirectal abscess extending anteriorally and back to the midline  Diagnosis: Perirectal abscess  Complications: none  EBL:   minimal cc  Drains: 1 inch iodophor packing  Description of Procedure:  The patient was taken to OR 1 at [facility].  After anesthesia was administered and the patient was prepped a timeout was performed.  With the patient in stirrups, I probed the open and necrotic opening on her right buttock cheek.  This was opened posteriorally and examinied digitlally and with a Kelly clamp to break up any loculations.  This appeared to extend to the midline posteriorally.  After drainage, the wound was packed with iodophor gauze and dressed.    The patient tolerated the procedure well and was taken to the PACU in stable condition.     Matt B. Hassell Done, [place], [facility], [place]

## 2014-02-18 NOTE — Anesthesia Preprocedure Evaluation (Addendum)
Anesthesia Evaluation  Patient identified by MRN, date of birth, ID band Patient awake    Reviewed: Allergy & Precautions, H&P , NPO status , Patient's Chart, lab work & pertinent test results, reviewed documented beta blocker date and time   Airway Mallampati: III TM Distance: >3 FB Neck ROM: Full    Dental no notable dental hx. (+) Chipped   Pulmonary shortness of breath and with exertion,  breath sounds clear to auscultation  Pulmonary exam normal       Cardiovascular hypertension, Pt. on medications and Pt. on home beta blockers + CAD and + Cardiac Stents negative cardio ROS  Rhythm:Regular Rate:Normal     Neuro/Psych  Headaches, PSYCHIATRIC DISORDERS Anxiety Depression    GI/Hepatic Neg liver ROS, GERD-  ,  Endo/Other  negative endocrine ROS  Renal/GU negative Renal ROS     Musculoskeletal negative musculoskeletal ROS (+)   Abdominal (+) + obese,   Peds  Hematology  (+) anemia ,   Anesthesia Other Findings   Reproductive/Obstetrics negative OB ROS                         Anesthesia Physical Anesthesia Plan  ASA: III  Anesthesia Plan: General   Post-op Pain Management:    Induction: Intravenous and Rapid sequence  Airway Management Planned: Oral ETT and Video Laryngoscope Planned  Additional Equipment:   Intra-op Plan:   Post-operative Plan: Extubation in OR  Informed Consent: I have reviewed the patients History and Physical, chart, labs and discussed the procedure including the risks, benefits and alternatives for the proposed anesthesia with the patient or authorized representative who has indicated his/her understanding and acceptance.   Dental advisory given  Plan Discussed with: CRNA  Anesthesia Plan Comments:        Anesthesia Quick Evaluation

## 2014-02-18 NOTE — Discharge Instructions (Signed)
Remove packing on Sunday.Shower at least twice daily and wear pad held in place with the mesh panties   Outpatient Surgery Guidelines Outpatient procedures are those for which the person having the procedure is allowed to go home the same day as the procedure. Various procedures are done on an outpatient basis. You should follow some general guidelines if you will be having an outpatient procedure. LET [facility] CARE PROVIDER KNOW ABOUT:  Any allergies you have.  All medicines you are taking, including vitamins, herbs, eye drops, creams, and over-the-counter medicines.  Previous problems you or members of your family have had with the use of anesthetics.  Any blood disorders you have.  Previous surgeries you have had.  Medical conditions you have. RISKS AND COMPLICATIONS Your health care provider will discuss possible risks and complications with you before surgery. Common risks and complications include:   Problems due to the use of anesthetics.  Blood loss and replacement (does not apply to minor surgical procedures).  Temporary increase in pain due to surgery.  Uncorrected pain or problems that the surgery was meant to correct.  Infection.  New damage. BEFORE THE PROCEDURE  Ask your health care provider about changing or stopping your regular medicines. You may need to stop taking certain medicines in the days or weeks before the procedure.  Stop smoking at least 2 weeks before surgery. This lowers your risk for complications during and after surgery. Ask your health care provider for help with this if needed.  Eat your usual meals and a light supper the day before surgery. Continue fluid intake. Do not drink alcohol.  Do not eat or drink after midnight the night before your surgery. Take your usual medicine the morning of surgery with a sip of water unless instructed otherwise. Check with your health care provider if you are unsure. This is particularly important if you  take diabetes medicine.  Arrange for someone to take you home and to stay with you for 24 hours after the procedure. Medicine given for your procedure may affect your ability to drive or to care for yourself.  Call your health care provider's office if you develop an illness or problem that may prevent you from safely having your procedure. AFTER THE PROCEDURE After surgery, you will be taken to a recovery area, where your progress will be monitored. If there are no complications, you will be allowed to go home when you are awake, stable, and taking fluids well. You may have numbness around the surgical site. Healing will take some time. You will have tenderness at the surgical site and may have some swelling and bruising. You may also have some nausea. HOME CARE INSTRUCTIONS  Do not drive for 24 hours, or as directed by your health care provider. Do not drive while taking prescription pain medicines.  Do not drink alcohol for 24 hours.  Do not make important decisions or sign legal documents for 24 hours.  You may resume a normal diet and activities as directed.  Do not lift anything heavier than 10 pounds (4.5 kg) or play contact sports until your health care provider says it is okay.  Change your bandages (dressings) as directed.  Only take over-the-counter or prescription medicines as directed by your health care provider.  Follow up with your health care provider as directed. SEEK MEDICAL CARE IF:  You have increased bleeding (more than a small spot) from the surgical site.  You have redness, swelling, or increasing pain in the wound.  You see pus coming from the wound.  You have a fever.  You notice a bad smell coming from the wound or dressing.  You feel lightheaded or faint.  You develop a rash.  You have trouble breathing.  You develop allergies. MAKE SURE YOU:  Understand these instructions.  Will watch your condition.  Will get help right away if you are  not doing well or get worse. Document Released: 03/26/2001 Document Revised: 07/06/2013 Document Reviewed: 12/03/2012 [facility] Patient Information 2015 [place], [place]. This information is not intended to replace advice given to you by your health care provider. Make sure you discuss any questions you have with your health care provider.

## 2014-02-18 NOTE — Transfer of Care (Signed)
Immediate Anesthesia Transfer of Care Note  Patient: Destiny Ballard  Procedure(s) Performed: Procedure(s): IRRIGATION AND DEBRIDEMENT PERIRECTAL ABSCESS (N/A)  Patient Location: PACU  Anesthesia Type:General  Level of Consciousness: sedated  Airway & Oxygen Therapy: Patient Spontanous Breathing and Patient connected to face mask oxygen  Post-op Assessment: Report given to PACU RN and Post -op Vital signs reviewed and stable  Post vital signs: Reviewed and stable  Complications: No apparent anesthesia complications

## 2014-02-22 ENCOUNTER — Encounter (HOSPITAL_COMMUNITY): Payer: Self-pay | Admitting: Surgery

## 2014-03-04 ENCOUNTER — Ambulatory Visit: Payer: No Typology Code available for payment source | Admitting: Nurse Practitioner

## 2014-03-24 ENCOUNTER — Encounter: Payer: Self-pay | Admitting: Nurse Practitioner

## 2014-03-24 ENCOUNTER — Ambulatory Visit (INDEPENDENT_AMBULATORY_CARE_PROVIDER_SITE_OTHER): Payer: No Typology Code available for payment source | Admitting: Nurse Practitioner

## 2014-03-24 VITALS — BP 140/78 | HR 80 | Ht 60.0 in | Wt 184.4 lb

## 2014-03-24 DIAGNOSIS — I10 Essential (primary) hypertension: Secondary | ICD-10-CM

## 2014-03-24 DIAGNOSIS — E785 Hyperlipidemia, unspecified: Secondary | ICD-10-CM

## 2014-03-24 DIAGNOSIS — R0789 Other chest pain: Secondary | ICD-10-CM

## 2014-03-24 DIAGNOSIS — I2510 Atherosclerotic heart disease of native coronary artery without angina pectoris: Secondary | ICD-10-CM

## 2014-03-24 DIAGNOSIS — G4733 Obstructive sleep apnea (adult) (pediatric): Secondary | ICD-10-CM

## 2014-03-24 LAB — CBC
HCT: 37 % (ref 36.0–46.0)
Hemoglobin: 12.1 g/dL (ref 12.0–15.0)
MCHC: 32.8 g/dL (ref 30.0–36.0)
MCV: 79.4 fl (ref 78.0–100.0)
Platelets: 289 10*3/uL (ref 150.0–400.0)
RBC: 4.66 Mil/uL (ref 3.87–5.11)
RDW: 14.9 % (ref 11.5–15.5)
WBC: 11.1 10*3/uL — ABNORMAL HIGH (ref 4.0–10.5)

## 2014-03-24 LAB — LIPID PANEL
Cholesterol: 162 mg/dL (ref 0–200)
HDL: 42.6 mg/dL (ref >39.00)
NonHDL: 119.4
Total CHOL/HDL Ratio: 4
Triglycerides: 324 mg/dL — ABNORMAL HIGH (ref 0.0–149.0)
VLDL: 64.8 mg/dL — ABNORMAL HIGH (ref 0.0–40.0)

## 2014-03-24 LAB — TSH: TSH: 0.83 u[IU]/mL (ref 0.35–4.50)

## 2014-03-24 LAB — HEPATIC FUNCTION PANEL
ALT: 25 U/L (ref 0–35)
AST: 29 U/L (ref 0–37)
Albumin: 3.7 g/dL (ref 3.5–5.2)
Alkaline Phosphatase: 88 U/L (ref 39–117)
Bilirubin, Direct: 0 mg/dL (ref 0.0–0.3)
Total Bilirubin: 0.3 mg/dL (ref 0.2–1.2)
Total Protein: 8 g/dL (ref 6.0–8.3)

## 2014-03-24 LAB — BASIC METABOLIC PANEL
BUN: 14 mg/dL (ref 6–23)
CO2: 29 mEq/L (ref 19–32)
Calcium: 9.9 mg/dL (ref 8.4–10.5)
Chloride: 99 mEq/L (ref 96–112)
Creatinine, Ser: 0.6 mg/dL (ref 0.4–1.2)
GFR: 99.04 mL/min (ref >60.00)
Glucose, Bld: 143 mg/dL — ABNORMAL HIGH (ref 70–99)
Potassium: 3.9 mEq/L (ref 3.5–5.1)
Sodium: 138 mEq/L (ref 135–145)

## 2014-03-24 LAB — HEMOGLOBIN A1C: Hgb A1c MFr Bld: 8.5 % — ABNORMAL HIGH (ref 4.6–6.5)

## 2014-03-24 LAB — LDL CHOLESTEROL, DIRECT: Direct LDL: 85.4 mg/dL

## 2014-03-24 NOTE — Patient Instructions (Addendum)
Stay on your current medicines  We will send you to Dr. Gwenette Greet for a sleep evaluation  We will check labs today  I will arrange for a stress test  Call the [place] office at [phone] if you have any questions, problems or concerns.

## 2014-03-24 NOTE — Progress Notes (Addendum)
[name] Date of Birth: 1950/11/13 Medical Record #[id number]  History of Present Illness: "[name]" is seen back today for a follow up visit. It is a 6 month check. I am seeing her husband as well today. Seen for Dr. Aundra Dubin. Former patient of Dr. Susa Simmonds. Has known CAD with PCI to the LAD in 2007, normal Myoview in 2010 and 2012. Last cath in 2008 - stent was patent. She has had atypical chest pain ever since her PCI. Other issues include HTN, HLD, obesity, anxiety, depression with prior remote suicide attempt and nephrolithiasis.   Last seen back in February of 2015 - was doing ok - noted to be diabetic. Having issues with a cough and seeing pulmonary. Cardiac status seemed to be stable.  Comes back today. Here with her husband. She has lots of issues as well today. She has gained weight. Has chest tightness with exertion - i.e., walking to take the trash out. Having issues with her feet. Did not go and get her diabetes addressed as I instructed last time. Lots of denial. Still with a cough but better with the tessalon. Went on a trip with her daughter who noted that she stopped breathing at night. Tired in the am. Snores. Would go and have a sleep evaluation. Sees Urgent Care if needed. BP ok. Taking her medicines. Has had lots of issues with "boils"  Actually has had to have I & D per general surgery.   Current Outpatient Prescriptions  Medication Sig Dispense Refill  . albuterol (PROVENTIL HFA;VENTOLIN HFA) 108 (90 BASE) MCG/ACT inhaler Inhale 1 puff into the lungs every 6 (six) hours as needed for shortness of breath.      [name] amLODipine (NORVASC) 10 MG tablet Take 10 mg by mouth daily with breakfast.      . aspirin EC 81 MG tablet Take 81 mg by mouth daily.      . benzonatate (TESSALON) 100 MG capsule Take 200 mg by mouth 2 (two) times daily as needed for cough.      . Cholecalciferol (VITAMIN D3) 1000 UNITS CAPS Take 1,000 Units by mouth daily.       . fexofenadine (ALLEGRA)  180 MG tablet Take 180 mg by mouth 2 (two) times daily.       [name] FLUoxetine (PROZAC) 40 MG capsule Take 1 capsule (40 mg total) by mouth daily.  90 capsule  3  . fluticasone (VERAMYST) 27.5 MCG/SPRAY nasal spray Place 2 sprays into the nose daily as needed for rhinitis.       [name] ibuprofen (ADVIL,MOTRIN) 200 MG tablet Take 800 mg by mouth every 6 (six) hours as needed for moderate pain.       . Ibuprofen-Diphenhydramine Cit (MOTRIN PM) 200-38 MG TABS Take 1 tablet by mouth at bedtime as needed (for sleep).      . metoprolol succinate (TOPROL-XL) 100 MG 24 hr tablet Take 100 mg by mouth daily with breakfast. Take with or immediately following a meal.      . montelukast (SINGULAIR) 10 MG tablet Take 1 tablet (10 mg total) by mouth at bedtime.  90 tablet  3  . nitroGLYCERIN (NITROSTAT) 0.4 MG SL tablet Place 0.4 mg under the tongue every 5 (five) minutes as needed.      [name] omeprazole (PRILOSEC) 40 MG capsule Take 40 mg by mouth daily with breakfast.      . rosuvastatin (CRESTOR) 20 MG tablet Take 20 mg by mouth daily with breakfast.  No current facility-administered medications for this visit.    Allergies  Allergen Reactions  . Prednisone Other (See Comments)    REACTION: mood swings, nightmares. "Shot doesn't bother me, reaction is just with the pill"    Past Medical History  Diagnosis Date  . Hypertension   . Hyperlipidemia   . Obesity   . Anxiety     Prior suicide attempt  . Depression   . Insulin resistance   . Iron deficiency anemia   . GERD (gastroesophageal reflux disease)   . Cervical spondylosis   . CAD (coronary artery disease)     a) s/p DES to LAD 07/2005 b) Last Myoview low risk 11/2011 showing small fixed apical perfusion defect (prior MI vs attenuation) but no ischemia - normal EF.    Past Surgical History  Procedure Laterality Date  . Cardiac catheterization  06/17/2007    NORMAL. EF 60%  . Coronary stent placement  07/2005    LEFT ANTERIOR DESCENDING  . Tubal  ligation    . Tonsillectomy and adenoidectomy    . Childbirth      X3  . Breast enhancement surgery    . Cervical spondylosis      SINGLE LEVEL FUSION  . Incision and drainage breast abscess  01/05/2012       . Forearm fracture surgery  2010    hand and shoulder   . Incision and drainage perirectal abscess N/A 02/18/2014    Procedure: IRRIGATION AND DEBRIDEMENT PERIRECTAL ABSCESS;  Surgeon: Pedro Earls, MD;  Location: [facility] ORS;  Service: General;  Laterality: N/A;    History  Smoking status  . Never Smoker   Smokeless tobacco  . Never Used    History  Alcohol Use  . 0.6 oz/week  . 1 Glasses of wine per week    Comment: social    Family History  Problem Relation Age of Onset  . Heart attack Mother   . Diabetes Mother   . Lung cancer Mother   . Asthma Mother   . Heart disease Mother   . Cancer Mother   . Suicidality Father   . Allergies Other     all family--seasonal allergies  . Asthma Daughter   . Cancer Daughter     pre-cancerous polyp  . Diabetes Sister   . Cancer Sister   . Asthma Daughter   . Cancer Daughter     cervical     Review of Systems: The review of systems is per the HPI.  All other systems were reviewed and are negative.  Physical Exam: BP 140/78  Pulse 80  Ht 5' (1.524 m)  Wt 184 lb 6.4 oz (83.643 kg)  BMI 36.01 kg/m2  SpO2 92% Patient is very pleasant and in no acute distress. She remains obese. Skin is warm and dry. Color is normal.  HEENT is unremarkable. Normocephalic/atraumatic. PERRL. Sclera are nonicteric. Neck is supple. No masses. No JVD. Lungs are clear. Cardiac exam shows a regular rate and rhythm. Rate a little fast by my exam.  Abdomen is soft. Extremities are without edema. Gait and ROM are intact. No gross neurologic deficits noted.  Wt Readings from Last 3 Encounters:  03/24/14 184 lb 6.4 oz (83.643 kg)  02/17/14 176 lb (79.833 kg)  02/17/14 183 lb (83.008 kg)    LABORATORY DATA/PROCEDURES:  Lab Results  Component  Value Date   WBC 11.6* 02/18/2014   HGB 10.4* 02/18/2014   HCT 32.2* 02/18/2014   PLT 246 02/18/2014  GLUCOSE 169* 02/18/2014   CHOL 189 05/10/2013   TRIG 234.0* 05/10/2013   HDL 54.40 05/10/2013   LDLDIRECT 111.0 05/10/2013   LDLCALC 70 03/23/2012   ALT 65* 02/18/2014   AST 26 02/18/2014   NA 138 02/18/2014   K 3.3* 02/18/2014   CL 97 02/18/2014   CREATININE 0.65 02/18/2014   BUN 15 02/18/2014   CO2 29 02/18/2014   TSH 0.88 09/10/2013   INR 0.97 12/11/2011   HGBA1C 7.8* 09/10/2013    BNP (last 3 results)  Recent Labs  05/10/13 1432  PROBNP 15.0     Assessment / Plan:  1. CAD with remote stenting of the LAD - last Myoview in 2012 was normal. Last cath from 2008 showed stent patency. Has had more chest tightness with exertion - lots of risk factors - will update her Myoview.   2. HTN - BP fair.  3. HLD - on Crestor - needs labs today  4. Obesity - continues to be an issue  5. Cough - chronic  6. Diabetes - not being actively treated. Discussed at length with her - very poor insight.   7. Probable OSA  Will get her labs updated. Arrange for Myoview. Arrange for sleep study. Further disposition to follow.  Patient is agreeable to this plan and will call if any problems develop in the interim.   Burtis Junes, RN, [facility] [address] [place], [place]  [address] [phone]  Addendum:  EKG today with sinus - rate slower at 78. Septal Q's but has had on remote tracing from 2012 noted. Will proceed with Myoview.

## 2014-03-25 ENCOUNTER — Telehealth: Payer: Self-pay | Admitting: Nurse Practitioner

## 2014-03-25 ENCOUNTER — Other Ambulatory Visit: Payer: Self-pay | Admitting: *Deleted

## 2014-03-25 DIAGNOSIS — R7309 Other abnormal glucose: Secondary | ICD-10-CM

## 2014-03-25 NOTE — Telephone Encounter (Signed)
New message   Patient calling    Need a referral to Dr. Loanne Drilling .

## 2014-03-25 NOTE — Telephone Encounter (Signed)
Left message on pt's phone that our office will call and schedule pt appointment with Dr. Loanne Drilling for elevated A1C. Sent to the scheduling pool.

## 2014-03-28 ENCOUNTER — Other Ambulatory Visit: Payer: Self-pay | Admitting: *Deleted

## 2014-03-28 ENCOUNTER — Telehealth: Payer: Self-pay | Admitting: Nurse Practitioner

## 2014-03-28 DIAGNOSIS — R7309 Other abnormal glucose: Secondary | ICD-10-CM

## 2014-03-28 NOTE — Telephone Encounter (Signed)
Follow up:    Pt has questions about seeing a Diabetes dr.  Dr Cordelia Pen office told her she needs a referral.  Please give pt a call back when done or what to do.

## 2014-03-28 NOTE — Telephone Encounter (Signed)
S/w pt's husband aware our office will be calling pt to schedule appointment with Dr. Loanne Drilling.  Linda at check out stated Dr. Loanne Drilling was only accepting pt's for endocrinology. Pt has elevated A1C. Waiting to hear back from [place] at check out.

## 2014-04-05 ENCOUNTER — Encounter: Payer: Self-pay | Admitting: Endocrinology

## 2014-04-05 ENCOUNTER — Ambulatory Visit (INDEPENDENT_AMBULATORY_CARE_PROVIDER_SITE_OTHER): Payer: No Typology Code available for payment source | Admitting: Endocrinology

## 2014-04-05 VITALS — BP 122/64 | HR 94 | Temp 98.7°F | Ht 60.0 in | Wt 183.0 lb

## 2014-04-05 DIAGNOSIS — Z23 Encounter for immunization: Secondary | ICD-10-CM

## 2014-04-05 MED ORDER — METFORMIN HCL ER 500 MG PO TB24: ORAL_TABLET | ORAL | Status: DC

## 2014-04-05 NOTE — Progress Notes (Signed)
Subjective:    Patient ID: Destiny Ballard, female    DOB: Jan 27, 1951, 63 y.o.   MRN: [id number]  HPI pt states DM was dx'ed in 2012; she has mild if any neuropathy of the lower extremities; he is unaware of any associated chronic complications; he has never been on medication for this; pt says her diet and exercise are "ok;" she has never had GDM, pancreatitis, severe hypoglycemia or DKA. Past Medical History  Diagnosis Date  . Hypertension   . Hyperlipidemia   . Obesity   . Anxiety     Prior suicide attempt  . Depression   . Insulin resistance   . Iron deficiency anemia   . GERD (gastroesophageal reflux disease)   . Cervical spondylosis   . CAD (coronary artery disease)     a) s/p DES to LAD 07/2005 b) Last Myoview low risk 11/2011 showing small fixed apical perfusion defect (prior MI vs attenuation) but no ischemia - normal EF.    Past Surgical History  Procedure Laterality Date  . Cardiac catheterization  06/17/2007    NORMAL. EF 60%  . Coronary stent placement  07/2005    LEFT ANTERIOR DESCENDING  . Tubal ligation    . Tonsillectomy and adenoidectomy    . Childbirth      X3  . Breast enhancement surgery    . Cervical spondylosis      SINGLE LEVEL FUSION  . Incision and drainage breast abscess  01/05/2012       . Forearm fracture surgery  2010    hand and shoulder   . Incision and drainage perirectal abscess N/A 02/18/2014    Procedure: IRRIGATION AND DEBRIDEMENT PERIRECTAL ABSCESS;  Surgeon: Pedro Earls, MD;  Location: [facility];  Service: General;  Laterality: N/A;    History   Social History  . Marital Status: Married    Spouse Name: Lake Bells    Number of Children: 3  . Years of Education: 12   Occupational History  . retired from [employer]     11/2012   Social History Main Topics  . Smoking status: Never Smoker   . Smokeless tobacco: Never Used  . Alcohol Use: 0.6 oz/week    1 Glasses of wine per week     Comment: social  . Drug Use: No  . Sexual  Activity: Yes    Partners: Male    Birth Control/ Protection: None   Other Topics Concern  . Not on file   Social History Narrative   Lives with her husband.  Their eldest daughter lives upstairs.    Current Outpatient Prescriptions on File Prior to Visit  Medication Sig Dispense Refill  . albuterol (PROVENTIL HFA;VENTOLIN HFA) 108 (90 BASE) MCG/ACT inhaler Inhale 1 puff into the lungs every 6 (six) hours as needed for shortness of breath.      [name] amLODipine (NORVASC) 10 MG tablet Take 10 mg by mouth daily with breakfast.      . aspirin EC 81 MG tablet Take 81 mg by mouth daily.      . benzonatate (TESSALON) 100 MG capsule Take 200 mg by mouth 2 (two) times daily as needed for cough.      . Cholecalciferol (VITAMIN D3) 1000 UNITS CAPS Take 1,000 Units by mouth daily.       . fexofenadine (ALLEGRA) 180 MG tablet Take 180 mg by mouth 2 (two) times daily.       [name] FLUoxetine (PROZAC) 40 MG capsule Take 1 capsule (40  mg total) by mouth daily.  90 capsule  3  . fluticasone (VERAMYST) 27.5 MCG/SPRAY nasal spray Place 2 sprays into the nose daily as needed for rhinitis.       [name] ibuprofen (ADVIL,MOTRIN) 200 MG tablet Take 800 mg by mouth every 6 (six) hours as needed for moderate pain.       . Ibuprofen-Diphenhydramine Cit (MOTRIN PM) 200-38 MG TABS Take 1 tablet by mouth at bedtime as needed (for sleep).      . metoprolol succinate (TOPROL-XL) 100 MG 24 hr tablet Take 100 mg by mouth daily with breakfast. Take with or immediately following a meal.      . montelukast (SINGULAIR) 10 MG tablet Take 1 tablet (10 mg total) by mouth at bedtime.  90 tablet  3  . nitroGLYCERIN (NITROSTAT) 0.4 MG SL tablet Place 0.4 mg under the tongue every 5 (five) minutes as needed.      [name] omeprazole (PRILOSEC) 40 MG capsule Take 40 mg by mouth daily with breakfast.      . rosuvastatin (CRESTOR) 20 MG tablet Take 20 mg by mouth daily with breakfast.       No current facility-administered medications on file prior to  visit.    Allergies  Allergen Reactions  . Prednisone Other (See Comments)    REACTION: mood swings, nightmares. "Shot doesn't bother me, reaction is just with the pill"    Family History  Problem Relation Age of Onset  . Heart attack Mother   . Diabetes Mother   . Lung cancer Mother   . Asthma Mother   . Heart disease Mother   . Cancer Mother   . Suicidality Father   . Allergies Other     all family--seasonal allergies  . Asthma Daughter   . Cancer Daughter     pre-cancerous polyp  . Diabetes Sister   . Cancer Sister   . Asthma Daughter   . Cancer Daughter     cervical     BP 122/64  Pulse 94  Temp(Src) 98.7 F (37.1 C) (Oral)  Ht 5' (1.524 m)  Wt 183 lb (83.008 kg)  BMI 35.74 kg/m2  SpO2 93%    Review of Systems denies blurry vision, headache, chest pain, sob, n/v, urinary frequency, excessive diaphoresis, cold intolerance, and easy bruising.  She has weight gain, depression, rhinorrhea, and leg cramps.     Objective:   Physical Exam VS: see vs page GEN: no distress HEAD: head: no deformity eyes: no periorbital swelling, no proptosis external nose and ears are normal mouth: no lesion seen NECK: supple, thyroid is not enlarged CHEST WALL: no deformity LUNGS:  Clear to auscultation CV: reg rate and rhythm, no murmur ABD: abdomen is soft, nontender.  no hepatosplenomegaly.  not distended.  no hernia MUSCULOSKELETAL: muscle bulk and strength are grossly normal.  no obvious joint swelling.  gait is normal and steady EXTEMITIES: no deformity.  no ulcer on the feet.  feet are of normal color and temp.  no edema PULSES: dorsalis pedis intact bilat.  no carotid bruit NEURO:  cn 2-12 grossly intact.   readily moves all 4's.  sensation is intact to touch on the feet SKIN:  Normal texture and temperature.  No rash or suspicious lesion is visible.   NODES:  None palpable at the neck PSYCH: alert, well-oriented.  Does not appear anxious nor depressed.   Lab  Results  Component Value Date   HGBA1C 8.5* 03/24/2014   i reviewed electrocardiogram (03/24/14)  i have reviewed the following outside records: Office notes    Assessment & Plan:  DM: new to me.  She needs increased rx    Patient is advised the following: Patient Instructions  good diet and exercise habits significanly improve the control of your diabetes.  please let me know if you wish to be referred to a dietician.  high blood sugar is very risky to your health.  you should see an eye doctor and dentist every year.   controlling your blood pressure and cholesterol drastically reduces the damage diabetes does to your body.  this also applies to quitting smoking.  please discuss these with your doctor.  check your blood sugar once a day.  vary the time of day when you check, between before the 3 meals, and at bedtime.  also check if you have symptoms of your blood sugar being too high or too low.  please keep a record of the readings and bring it to your next appointment here.  You can write it on any piece of paper.  please call us sooner if your blood sugar goes below 70, or if you have a lot of readings over 200. i have sent a prescription to your pharmacy, for the blood sugar.  Please come back for a follow-up appointment in January.

## 2014-04-05 NOTE — Patient Instructions (Signed)
good diet and exercise habits significanly improve the control of your diabetes.  please let me know if you wish to be referred to a dietician.  high blood sugar is very risky to your health.  you should see an eye doctor and dentist every year.   controlling your blood pressure and cholesterol drastically reduces the damage diabetes does to your body.  this also applies to quitting smoking.  please discuss these with your doctor.  check your blood sugar once a day.  vary the time of day when you check, between before the 3 meals, and at bedtime.  also check if you have symptoms of your blood sugar being too high or too low.  please keep a record of the readings and bring it to your next appointment here.  You can write it on any piece of paper.  please call us sooner if your blood sugar goes below 70, or if you have a lot of readings over 200. i have sent a prescription to your pharmacy, for the blood sugar.  Please come back for a follow-up appointment in January.

## 2014-04-06 ENCOUNTER — Encounter (HOSPITAL_COMMUNITY): Payer: No Typology Code available for payment source

## 2014-05-06 ENCOUNTER — Encounter: Payer: Self-pay | Admitting: *Deleted

## 2014-05-06 ENCOUNTER — Ambulatory Visit (INDEPENDENT_AMBULATORY_CARE_PROVIDER_SITE_OTHER): Payer: No Typology Code available for payment source | Admitting: Pulmonary Disease

## 2014-05-06 VITALS — BP 124/72 | HR 84 | Temp 98.9°F | Ht 60.0 in | Wt 189.6 lb

## 2014-05-06 DIAGNOSIS — G4733 Obstructive sleep apnea (adult) (pediatric): Secondary | ICD-10-CM | POA: Insufficient documentation

## 2014-05-06 HISTORY — DX: Obstructive sleep apnea (adult) (pediatric): G47.33

## 2014-05-06 NOTE — Patient Instructions (Signed)
Will schedule for a sleep study, and arrange followup once the results are available.  Work on weight loss.   

## 2014-05-06 NOTE — Assessment & Plan Note (Signed)
The patient's history is very suggestive of clinically significant sleep disordered breathing.  I have had a long discussion with her about sleep apnea, including its impact to her quality of life and cardiovascular health. I think she needs to have a sleep study for diagnosis, and the patient is agreeable to this approach.

## 2014-05-06 NOTE — Progress Notes (Signed)
Subjective:    Patient ID: Destiny Ballard, female    DOB: 1951/05/14, 63 y.o.   MRN: [id number]  HPI The patient is a 63 year old female who I've been asked to see for possible obstructive sleep apnea.  She has been noted to have loud snoring by her husband, and also her breathing pattern at night scares her husband.  She admits to having gasping episodes at night, and awakens at least 3-4 times a night. She is not rested in the mornings upon arising, and notes definite sleep pressure during the day with inactivity. She does not think she has sleepiness issues in the evening, but does have some sleep pressure with driving. The patient's weight is up 30 pounds over the last 2 years, and her Epworth score today is 7.   Sleep Questionnaire What time do you typically go to bed?( Between what hours) 9:30-10PM 9:30-10PM at 1510 on 05/06/14 by Inge Rise, CMA How long does it take you to fall asleep? if takes medication then 1 hr if takes medication then 1 hr at 1510 on 05/06/14 by Inge Rise, CMA How many times during the night do you wake up? 4 4 at 1510 on 05/06/14 by Inge Rise, CMA What time do you get out of bed to start your day? 0730 0730 at 1510 on 05/06/14 by Inge Rise, CMA Do you drive or operate heavy machinery in your occupation? No No at 1510 on 05/06/14 by Inge Rise, CMA How much has your weight changed (up or down) over the past two years? (In pounds) 30 lb (13.608 kg) 30 lb (13.608 kg) at 1510 on 05/06/14 by Inge Rise, CMA Have you ever had a sleep study before? No No at 1510 on 05/06/14 by Inge Rise, CMA Do you currently use CPAP? No No at 1510 on 05/06/14 by Inge Rise, CMA Do you wear oxygen at any time? No No at 1510 on 05/06/14 by Inge Rise, CMA   Review of Systems  Constitutional: Negative for fever and unexpected weight change.  HENT: Positive for congestion, postnasal drip, rhinorrhea, sore throat and trouble swallowing.  Negative for dental problem, ear pain, nosebleeds, sinus pressure and sneezing.   Eyes: Negative for redness and itching.  Respiratory: Positive for cough and shortness of breath. Negative for chest tightness and wheezing.   Cardiovascular: Positive for leg swelling. Negative for palpitations.  Gastrointestinal: Negative for nausea and vomiting.  Genitourinary: Negative for dysuria.  Musculoskeletal: Negative for joint swelling.  Skin: Negative for rash.  Neurological: Positive for headaches.  Hematological: Does not bruise/bleed easily.  Psychiatric/Behavioral: Positive for dysphoric mood. The patient is nervous/anxious.        Objective:   Physical Exam Constitutional:  Obese female, no acute distress  HENT:  Nares patent without discharge, narrowed bilat.   Oropharynx without exudate, palate and uvula are thick and elongated, large tongue.   Eyes:  Perrla, eomi, no scleral icterus  Neck:  No JVD, no TMG  Cardiovascular:  Normal rate, regular rhythm, no rubs or gallops.  No murmurs        Intact distal pulses but decreased.  Pulmonary :  Normal breath sounds, no stridor or respiratory distress   No rales, rhonchi, or wheezing  Abdominal:  Soft, nondistended, bowel sounds present.  No tenderness noted.   Musculoskeletal:  mild lower extremity edema noted.  Lymph Nodes:  No cervical lymphadenopathy noted  Skin:  No cyanosis noted  Neurologic:  Alert, appropriate, moves all 4 extremities without obvious deficit.         Assessment & Plan:

## 2014-05-25 ENCOUNTER — Ambulatory Visit (HOSPITAL_COMMUNITY): Payer: Medicare Other | Attending: Cardiology | Admitting: Radiology

## 2014-05-25 DIAGNOSIS — E785 Hyperlipidemia, unspecified: Secondary | ICD-10-CM

## 2014-05-25 DIAGNOSIS — I10 Essential (primary) hypertension: Secondary | ICD-10-CM

## 2014-05-25 DIAGNOSIS — R0609 Other forms of dyspnea: Secondary | ICD-10-CM | POA: Diagnosis not present

## 2014-05-25 DIAGNOSIS — R079 Chest pain, unspecified: Secondary | ICD-10-CM | POA: Insufficient documentation

## 2014-05-25 DIAGNOSIS — E119 Type 2 diabetes mellitus without complications: Secondary | ICD-10-CM | POA: Insufficient documentation

## 2014-05-25 DIAGNOSIS — R002 Palpitations: Secondary | ICD-10-CM | POA: Insufficient documentation

## 2014-05-25 DIAGNOSIS — I2510 Atherosclerotic heart disease of native coronary artery without angina pectoris: Secondary | ICD-10-CM

## 2014-05-25 DIAGNOSIS — R0789 Other chest pain: Secondary | ICD-10-CM

## 2014-05-25 MED ORDER — TECHNETIUM TC 99M SESTAMIBI GENERIC - CARDIOLITE
11.0000 | Freq: Once | INTRAVENOUS | Status: AC | PRN
  Administered 2014-05-25: 11 via INTRAVENOUS

## 2014-05-25 MED ORDER — TECHNETIUM TC 99M SESTAMIBI GENERIC - CARDIOLITE
33.0000 | Freq: Once | INTRAVENOUS | Status: AC | PRN
  Administered 2014-05-25: 33 via INTRAVENOUS

## 2014-05-25 MED ORDER — REGADENOSON 0.4 MG/5ML IV SOLN
0.4000 mg | Freq: Once | INTRAVENOUS | Status: AC
  Administered 2014-05-25: 0.4 mg via INTRAVENOUS

## 2014-05-25 NOTE — Progress Notes (Signed)
[facility] [address] [place], [place] [address] [phone]    Cardiology Nuclear Med Study  Destiny Ballard is a 63 y.o. female     MRN : [id number]     DOB: September 29, 1950  Procedure Date: 05/25/2014  Nuclear Med Background Indication for Stress Test:  Evaluation for Ischemia and Follow up CAD History:  CAD, MPI 2013 (normal) EF 60% Cardiac Risk Factors: Hypertension and NIDDM  Symptoms:  Chest Pain (last date of chest discomfort was one week ago), DOE and Palpitations   Nuclear Pre-Procedure Caffeine/Decaff Intake:  None NPO After: 7:00pm   Lungs:  clear O2 Sat: 91% on room air. IV 0.9% NS with Angio Cath:  22g  IV Site: R Hand  IV Started by:  Crissie Figures, RN  Chest Size (in):  38 Cup Size: C  Height: 5' (1.524 m)  Weight:  184 lb (83.462 kg)  BMI:  Body mass index is 35.94 kg/(m^2). Tech Comments:  N/A    Nuclear Med Study 1 or 2 day study: 1 day  Stress Test Type:  Lexiscan  Reading MD: N/A  Order Authorizing Provider:  Loralie Champagne, MD  Resting Radionuclide: Technetium 35m Sestamibi  Resting Radionuclide Dose: 11.0 mCi   Stress Radionuclide:  Technetium 43m Sestamibi  Stress Radionuclide Dose: 33.0 mCi           Stress Protocol Rest HR: 89 Stress HR: 113  Rest BP: 172/73 Stress BP: 154/61  Exercise Time (min): n/a METS: n/a   Predicted Max HR: 155 bpm % Max HR: 72.9 bpm Rate Pressure Product: 18871   Dose of Adenosine (mg):  n/a Dose of Lexiscan: 0.4 mg  Dose of Atropine (mg): n/a Dose of Dobutamine: n/a mcg/kg/min (at max HR)  Stress Test Technologist: Glade Lloyd, BS-ES  Nuclear Technologist:  Vedia Pereyra, CNMT     Rest Procedure:  Myocardial perfusion imaging was performed at rest 45 minutes following the intravenous administration of Technetium 82m Sestamibi. Rest ECG: NSR - Normal EKG  Stress Procedure:  The patient received IV Lexiscan 0.4 mg over 15-seconds.  Technetium 35m Sestamibi injected at  30-seconds.  Quantitative spect images were obtained after a 45 minute delay.  During the infusion of Lexiscan the patient complained of SOB and a smothering feeling.  These symptoms subsided in recovery.  Stress ECG: No significant change from baseline ECG  QPS Raw Data Images:  Normal; no motion artifact; normal heart/lung ratio. Stress Images:  Normal homogeneous uptake in all areas of the myocardium. Rest Images:  Normal homogeneous uptake in all areas of the myocardium. Subtraction (SDS):  There is no evidence of scar or ischemia. Transient Ischemic Dilatation (Normal <1.22):  0.91 Lung/Heart Ratio (Normal <0.45):  0.33  Quantitative Gated Spect Images QGS EDV:  59 ml QGS ESV:  17 ml  Impression Exercise Capacity:  Lexiscan with no exercise. BP Response:  Normal blood pressure response. Clinical Symptoms:  Dyspnea ECG Impression:  No significant ST segment change suggestive of ischemia. Comparison with Prior Nuclear Study: No images to compare  Overall Impression:  Normal stress nuclear study.  LV Ejection Fraction: 72%.  LV Wall Motion:  NL LV Function; NL Wall Motion   Loralie Champagne 05/25/2014

## 2014-06-05 ENCOUNTER — Other Ambulatory Visit: Payer: Self-pay | Admitting: Family Medicine

## 2014-07-02 ENCOUNTER — Other Ambulatory Visit: Payer: Self-pay | Admitting: Family Medicine

## 2014-07-11 ENCOUNTER — Emergency Department (HOSPITAL_BASED_OUTPATIENT_CLINIC_OR_DEPARTMENT_OTHER)
Admission: EM | Admit: 2014-07-11 | Discharge: 2014-07-11 | Disposition: A | Discharge status: Home / Self Care | Payer: Medicare Other | Attending: Emergency Medicine | Admitting: Emergency Medicine

## 2014-07-11 ENCOUNTER — Encounter (HOSPITAL_BASED_OUTPATIENT_CLINIC_OR_DEPARTMENT_OTHER): Payer: Self-pay

## 2014-07-11 DIAGNOSIS — F419 Anxiety disorder, unspecified: Secondary | ICD-10-CM | POA: Insufficient documentation

## 2014-07-11 DIAGNOSIS — E669 Obesity, unspecified: Secondary | ICD-10-CM | POA: Insufficient documentation

## 2014-07-11 DIAGNOSIS — K219 Gastro-esophageal reflux disease without esophagitis: Secondary | ICD-10-CM | POA: Insufficient documentation

## 2014-07-11 DIAGNOSIS — L089 Local infection of the skin and subcutaneous tissue, unspecified: Secondary | ICD-10-CM | POA: Diagnosis present

## 2014-07-11 DIAGNOSIS — I10 Essential (primary) hypertension: Secondary | ICD-10-CM | POA: Insufficient documentation

## 2014-07-11 DIAGNOSIS — Z862 Personal history of diseases of the blood and blood-forming organs and certain disorders involving the immune mechanism: Secondary | ICD-10-CM | POA: Diagnosis not present

## 2014-07-11 DIAGNOSIS — Z9889 Other specified postprocedural states: Secondary | ICD-10-CM | POA: Diagnosis not present

## 2014-07-11 DIAGNOSIS — Z7982 Long term (current) use of aspirin: Secondary | ICD-10-CM | POA: Diagnosis not present

## 2014-07-11 DIAGNOSIS — F329 Major depressive disorder, single episode, unspecified: Secondary | ICD-10-CM | POA: Insufficient documentation

## 2014-07-11 DIAGNOSIS — Z23 Encounter for immunization: Secondary | ICD-10-CM | POA: Insufficient documentation

## 2014-07-11 DIAGNOSIS — L0291 Cutaneous abscess, unspecified: Secondary | ICD-10-CM

## 2014-07-11 DIAGNOSIS — E785 Hyperlipidemia, unspecified: Secondary | ICD-10-CM | POA: Insufficient documentation

## 2014-07-11 DIAGNOSIS — Z79899 Other long term (current) drug therapy: Secondary | ICD-10-CM | POA: Insufficient documentation

## 2014-07-11 DIAGNOSIS — Z8781 Personal history of (healed) traumatic fracture: Secondary | ICD-10-CM | POA: Diagnosis not present

## 2014-07-11 DIAGNOSIS — Z8739 Personal history of other diseases of the musculoskeletal system and connective tissue: Secondary | ICD-10-CM | POA: Insufficient documentation

## 2014-07-11 DIAGNOSIS — I2510 Atherosclerotic heart disease of native coronary artery without angina pectoris: Secondary | ICD-10-CM | POA: Insufficient documentation

## 2014-07-11 DIAGNOSIS — J340 Abscess, furuncle and carbuncle of nose: Secondary | ICD-10-CM | POA: Insufficient documentation

## 2014-07-11 DIAGNOSIS — Z7951 Long term (current) use of inhaled steroids: Secondary | ICD-10-CM | POA: Diagnosis not present

## 2014-07-11 MED ORDER — HYDROCODONE-ACETAMINOPHEN 5-325 MG PO TABS: 1.0000 | ORAL_TABLET | ORAL | Status: DC | PRN

## 2014-07-11 MED ORDER — CLINDAMYCIN HCL 300 MG PO CAPS: 300.0000 mg | ORAL_CAPSULE | Freq: Three times a day (TID) | ORAL | Status: DC

## 2014-07-11 MED ORDER — TETANUS-DIPHTH-ACELL PERTUSSIS 5-2.5-18.5 LF-MCG/0.5 IM SUSP
0.5000 mL | Freq: Once | INTRAMUSCULAR | Status: AC
  Administered 2014-07-11: 0.5 mL via INTRAMUSCULAR

## 2014-07-11 MED ORDER — MUPIROCIN 2 % EX OINT: 1.0000 "application " | TOPICAL_OINTMENT | Freq: Two times a day (BID) | CUTANEOUS | Status: DC

## 2014-07-11 MED ORDER — TETANUS-DIPHTH-ACELL PERTUSSIS 5-2.5-18.5 LF-MCG/0.5 IM SUSP
INTRAMUSCULAR | Status: AC
  Filled 2014-07-11: qty 0.5

## 2014-07-11 NOTE — ED Provider Notes (Signed)
CSN: [id number]     Arrival date & time 07/11/14  1137 History   First MD Initiated Contact with Patient 07/11/14 1153     Chief Complaint  Patient presents with  . Recurrent Skin Infections     (Consider location/radiation/quality/duration/timing/severity/associated sxs/prior Treatment) HPI Comments: Pt comes in with complaint of a boil to just inside the right nare that started a couple of days ago. She has history of similar symptoms. Denies fever. States that she has been using neosporin without relief. Her bs has been around 200. No drainage. States that her gums are very tender  The history is provided by the patient. No language interpreter was used.    Past Medical History  Diagnosis Date  . Hypertension   . Hyperlipidemia   . Obesity   . Anxiety     Prior suicide attempt  . Depression   . Insulin resistance   . Iron deficiency anemia   . GERD (gastroesophageal reflux disease)   . Cervical spondylosis   . CAD (coronary artery disease)     a) s/p DES to LAD 07/2005 b) Last Myoview low risk 11/2011 showing small fixed apical perfusion defect (prior MI vs attenuation) but no ischemia - normal EF.   Past Surgical History  Procedure Laterality Date  . Cardiac catheterization  06/17/2007    NORMAL. EF 60%  . Coronary stent placement  07/2005    LEFT ANTERIOR DESCENDING  . Tubal ligation    . Tonsillectomy and adenoidectomy    . Childbirth      X3  . Breast enhancement surgery    . Cervical spondylosis      SINGLE LEVEL FUSION  . Incision and drainage breast abscess  01/05/2012       . Forearm fracture surgery  2010    hand and shoulder   . Incision and drainage perirectal abscess N/A 02/18/2014    Procedure: IRRIGATION AND DEBRIDEMENT PERIRECTAL ABSCESS;  Surgeon: Pedro Earls, MD;  Location: [facility];  Service: General;  Laterality: N/A;  . Rotator cuff repair      bilaterla   Family History  Problem Relation Age of Onset  . Heart attack Mother   . Diabetes  Mother   . Lung cancer Mother   . Asthma Mother   . Heart disease Mother   . Suicidality Father     "killed himself"  . Allergies Other     all family--seasonal allergies  . Asthma Daughter     x 2  . Cancer Daughter     pre-cancerous polyp  . Diabetes Sister   . Cancer Sister   . Cervical cancer Daughter     cervical    History  Substance Use Topics  . Smoking status: Never Smoker   . Smokeless tobacco: Never Used  . Alcohol Use: No   OB History    No data available     Review of Systems  All other systems reviewed and are negative.     Allergies  Prednisone  Home Medications   Prior to Admission medications   Medication Sig Start Date End Date Taking? Authorizing Provider  albuterol (PROVENTIL HFA;VENTOLIN HFA) 108 (90 BASE) MCG/ACT inhaler Inhale 1 puff into the lungs every 6 (six) hours as needed for shortness of breath.    Historical Provider, MD  amLODipine (NORVASC) 10 MG tablet Take 10 mg by mouth daily with breakfast.    Historical Provider, MD  aspirin EC 81 MG tablet Take 81 mg by mouth  daily.    Historical Provider, MD  benzonatate (TESSALON) 100 MG capsule Take 200 mg by mouth 2 (two) times daily as needed for cough.    Historical Provider, MD  Cholecalciferol (VITAMIN D3) 1000 UNITS CAPS Take 1,000 Units by mouth daily.     Historical Provider, MD  clindamycin (CLEOCIN) 300 MG capsule Take 1 capsule (300 mg total) by mouth 3 (three) times daily. 07/11/14   Glendell Docker, NP  diphenhydramine-acetaminophen (TYLENOL PM) 25-500 MG TABS Take 1 tablet by mouth at bedtime as needed (for sleep).    Historical Provider, MD  fexofenadine (ALLEGRA) 180 MG tablet Take 180 mg by mouth 2 (two) times daily.     Historical Provider, MD  FLUoxetine (PROZAC) 40 MG capsule Take 1 capsule (40 mg total) by mouth daily. 09/10/13   Burtis Junes, NP  FLUoxetine (PROZAC) 40 MG capsule Take 1 capsule (40 mg total) by mouth daily. PATIENT NEEDS OFFICE VISIT FOR ADDITIONAL  REFILLS 07/04/14   Darreld Mclean, MD  fluticasone (VERAMYST) 27.5 MCG/SPRAY nasal spray Place 2 sprays into the nose daily as needed for rhinitis.     Historical Provider, MD  HYDROcodone-acetaminophen (NORCO/VICODIN) 5-325 MG per tablet Take 1-2 tablets by mouth every 4 (four) hours as needed. 07/11/14   Glendell Docker, NP  ibuprofen (ADVIL,MOTRIN) 200 MG tablet Take 800 mg by mouth every 6 (six) hours as needed for moderate pain.     Historical Provider, MD  Ibuprofen-Diphenhydramine Cit (MOTRIN PM) 200-38 MG TABS Take 1 tablet by mouth at bedtime as needed (for sleep).    Historical Provider, MD  metFORMIN (GLUCOPHAGE-XR) 500 MG 24 hr tablet 4 tabs daily 04/05/14   Renato Shin, MD  metoprolol succinate (TOPROL-XL) 100 MG 24 hr tablet Take 100 mg by mouth daily with breakfast. Take with or immediately following a meal.    Historical Provider, MD  montelukast (SINGULAIR) 10 MG tablet Take 1 tablet (10 mg total) by mouth at bedtime. 10/14/13   Chelle Janalee Dane, PA-C  mupirocin ointment (BACTROBAN) 2 % Place 1 application into the nose 2 (two) times daily. 07/11/14   Glendell Docker, NP  nitroGLYCERIN (NITROSTAT) 0.4 MG SL tablet Place 0.4 mg under the tongue every 5 (five) minutes as needed. 12/11/11   Dayna N Dunn, PA-C  omeprazole (PRILOSEC) 40 MG capsule Take 40 mg by mouth daily with breakfast.    Historical Provider, MD  rosuvastatin (CRESTOR) 20 MG tablet Take 20 mg by mouth daily with breakfast.    Historical Provider, MD   BP 136/72 mmHg  Pulse 82  Temp(Src) 98.4 F (36.9 C) (Oral)  Resp 18  Ht 5' (1.524 m)  Wt 180 lb (81.647 kg)  BMI 35.15 kg/m2  SpO2 95% Physical Exam  Constitutional: She is oriented to person, place, and time. She appears well-developed and well-nourished.  HENT:  Right Ear: External ear normal.  Left Ear: External ear normal.  Nose:    Mouth/Throat: Oropharynx is clear and moist.  Localized area of redness noted to the right nare.no no tunneling  noted.no oral lip swelling or redness noted. Area is crusted without drainage noted  Cardiovascular: Normal rate and regular rhythm.   Musculoskeletal: Normal range of motion.  Neurological: She is alert and oriented to person, place, and time.  Nursing note and vitals reviewed.   ED Course  Procedures (including critical care time) Labs Review Labs Reviewed - No data to display  Imaging Review No results found.   EKG Interpretation None  MDM   Final diagnoses:  Abscess    Early abscess to the area. Pt given clindamycin and hydrocodone and bactroban. Area is localized at this time but told to follow up with ent for worsening symptoms    Glendell Docker, NP 07/11/14 [place], MD 07/12/14 1038

## 2014-07-11 NOTE — ED Notes (Signed)
Pt reports a boil that appeared in right nare.  Pt reports it drained.  Pt also reports lip swelling with it.

## 2014-07-11 NOTE — Discharge Instructions (Signed)

## 2014-07-11 NOTE — ED Notes (Signed)
NP at bedside.

## 2014-07-28 ENCOUNTER — Encounter (HOSPITAL_BASED_OUTPATIENT_CLINIC_OR_DEPARTMENT_OTHER): Payer: No Typology Code available for payment source

## 2014-08-05 ENCOUNTER — Ambulatory Visit: Payer: No Typology Code available for payment source | Admitting: Endocrinology

## 2014-08-10 ENCOUNTER — Other Ambulatory Visit: Payer: Self-pay

## 2014-08-10 MED ORDER — METFORMIN HCL ER 500 MG PO TB24: ORAL_TABLET | ORAL | Status: DC

## 2014-08-14 ENCOUNTER — Telehealth: Payer: Self-pay | Admitting: Family Medicine

## 2014-08-16 NOTE — Telephone Encounter (Signed)
Called pt to discuss f/up plan. Pt agreed to come see Dr Lorelei Pont next Mon between 1-3 pm at 102. Sent in 1 more week of med.

## 2014-08-17 ENCOUNTER — Other Ambulatory Visit: Payer: Self-pay | Admitting: Emergency Medicine

## 2014-08-25 ENCOUNTER — Other Ambulatory Visit: Payer: Self-pay | Admitting: Nurse Practitioner

## 2014-08-25 NOTE — Telephone Encounter (Signed)
Received another RF req for prozac. Dr Lorelei Pont, we have put notices on last 3 RFs and I talked to pt last time on 08/16/14 below. She is aware she needs to come in. Do you want to deny, give a few more?

## 2014-08-26 ENCOUNTER — Other Ambulatory Visit: Payer: Self-pay

## 2014-08-26 ENCOUNTER — Ambulatory Visit (INDEPENDENT_AMBULATORY_CARE_PROVIDER_SITE_OTHER): Payer: No Typology Code available for payment source | Admitting: Endocrinology

## 2014-08-26 ENCOUNTER — Encounter: Payer: Self-pay | Admitting: Endocrinology

## 2014-08-26 VITALS — BP 144/88 | HR 95 | Temp 98.9°F | Ht 60.0 in | Wt 184.0 lb

## 2014-08-26 DIAGNOSIS — E119 Type 2 diabetes mellitus without complications: Secondary | ICD-10-CM | POA: Diagnosis not present

## 2014-08-26 LAB — MICROALBUMIN / CREATININE URINE RATIO
Creatinine,U: 114.8 mg/dL
Microalb Creat Ratio: 4.1 mg/g (ref 0.0–30.0)
Microalb, Ur: 4.7 mg/dL — ABNORMAL HIGH (ref 0.0–1.9)

## 2014-08-26 LAB — HEMOGLOBIN A1C: Hgb A1c MFr Bld: 8.4 % — ABNORMAL HIGH (ref 4.6–6.5)

## 2014-08-26 MED ORDER — SITAGLIPTIN PHOSPHATE 100 MG PO TABS: 100.0000 mg | ORAL_TABLET | Freq: Every day | ORAL | Status: DC

## 2014-08-26 MED ORDER — METOPROLOL SUCCINATE ER 100 MG PO TB24: 100.0000 mg | ORAL_TABLET | Freq: Every day | ORAL | Status: DC

## 2014-08-26 NOTE — Progress Notes (Signed)
Subjective:    Patient ID: Destiny Ballard, female    DOB: 12-May-1951, 64 y.o.   MRN: [id number]  HPI  Pt returns for f/u of diabetes mellitus: DM type: 2 Dx'ed: 6734 Complications: none Therapy: metformin GDM: never DKA: never Severe hypoglycemia: never Pancreatitis: never Other: she has never been on insulin.  Interval history: She denies weight change.  pt states she feels well in general.   Past Medical History  Diagnosis Date  . Hypertension   . Hyperlipidemia   . Obesity   . Anxiety     Prior suicide attempt  . Depression   . Insulin resistance   . Iron deficiency anemia   . GERD (gastroesophageal reflux disease)   . Cervical spondylosis   . CAD (coronary artery disease)     a) s/p DES to LAD 07/2005 b) Last Myoview low risk 11/2011 showing small fixed apical perfusion defect (prior MI vs attenuation) but no ischemia - normal EF.    Past Surgical History  Procedure Laterality Date  . Cardiac catheterization  06/17/2007    NORMAL. EF 60%  . Coronary stent placement  07/2005    LEFT ANTERIOR DESCENDING  . Tubal ligation    . Tonsillectomy and adenoidectomy    . Childbirth      X3  . Breast enhancement surgery    . Cervical spondylosis      SINGLE LEVEL FUSION  . Incision and drainage breast abscess  01/05/2012       . Forearm fracture surgery  2010    hand and shoulder   . Incision and drainage perirectal abscess N/A 02/18/2014    Procedure: IRRIGATION AND DEBRIDEMENT PERIRECTAL ABSCESS;  Surgeon: Pedro Earls, MD;  Location: [facility];  Service: General;  Laterality: N/A;  . Rotator cuff repair      bilaterla    History   Social History  . Marital Status: Married    Spouse Name: Lake Bells  . Number of Children: 3  . Years of Education: 12   Occupational History  . retired from [employer]     11/2012   Social History Main Topics  . Smoking status: Never Smoker   . Smokeless tobacco: Never Used  . Alcohol Use: No  . Drug Use: No  . Sexual Activity:   Partners: Male    Birth Control/ Protection: None   Other Topics Concern  . Not on file   Social History Narrative   Lives with her husband.  Their eldest daughter lives upstairs.    Current Outpatient Prescriptions on File Prior to Visit  Medication Sig Dispense Refill  . albuterol (PROVENTIL HFA;VENTOLIN HFA) 108 (90 BASE) MCG/ACT inhaler Inhale 1 puff into the lungs every 6 (six) hours as needed for shortness of breath.    [name] amLODipine (NORVASC) 10 MG tablet Take 10 mg by mouth daily with breakfast.    . aspirin EC 81 MG tablet Take 81 mg by mouth daily.    . benzonatate (TESSALON) 100 MG capsule Take 200 mg by mouth 2 (two) times daily as needed for cough.    . Cholecalciferol (VITAMIN D3) 1000 UNITS CAPS Take 1,000 Units by mouth daily.     . diphenhydramine-acetaminophen (TYLENOL PM) 25-500 MG TABS Take 1 tablet by mouth at bedtime as needed (for sleep).    . fexofenadine (ALLEGRA) 180 MG tablet Take 180 mg by mouth 2 (two) times daily.     [name] FLUoxetine (PROZAC) 40 MG capsule Take 1 capsule (40 mg  total) by mouth daily. 90 capsule 3  . FLUoxetine (PROZAC) 40 MG capsule Take 1 capsule (40 mg total) by mouth daily. NO MORE REFILLS WITHOUT OFFICE VISIT - 3RD NOTICE 7 capsule 0  . fluticasone (VERAMYST) 27.5 MCG/SPRAY nasal spray Place 2 sprays into the nose daily as needed for rhinitis.     [name] HYDROcodone-acetaminophen (NORCO/VICODIN) 5-325 MG per tablet Take 1-2 tablets by mouth every 4 (four) hours as needed. 15 tablet 0  . ibuprofen (ADVIL,MOTRIN) 200 MG tablet Take 800 mg by mouth every 6 (six) hours as needed for moderate pain.     . Ibuprofen-Diphenhydramine Cit (MOTRIN PM) 200-38 MG TABS Take 1 tablet by mouth at bedtime as needed (for sleep).    . metFORMIN (GLUCOPHAGE-XR) 500 MG 24 hr tablet 4 tabs daily 360 tablet 1  . montelukast (SINGULAIR) 10 MG tablet Take 1 tablet (10 mg total) by mouth at bedtime. 90 tablet 3  . mupirocin ointment (BACTROBAN) 2 % Place 1 application into  the nose 2 (two) times daily. 22 g 0  . nitroGLYCERIN (NITROSTAT) 0.4 MG SL tablet Place 0.4 mg under the tongue every 5 (five) minutes as needed.    [name] omeprazole (PRILOSEC) 40 MG capsule Take 40 mg by mouth daily with breakfast.    . rosuvastatin (CRESTOR) 20 MG tablet Take 20 mg by mouth daily with breakfast.     No current facility-administered medications on file prior to visit.    Allergies  Allergen Reactions  . Prednisone Other (See Comments)    REACTION: mood swings, nightmares. "Shot doesn't bother me, reaction is just with the pill"    Family History  Problem Relation Age of Onset  . Heart attack Mother   . Diabetes Mother   . Lung cancer Mother   . Asthma Mother   . Heart disease Mother   . Suicidality Father     "killed himself"  . Allergies Other     all family--seasonal allergies  . Asthma Daughter     x 2  . Cancer Daughter     pre-cancerous polyp  . Diabetes Sister   . Cancer Sister   . Cervical cancer Daughter     cervical     BP 144/88 mmHg  Pulse 95  Temp(Src) 98.9 F (37.2 C) (Oral)  Ht 5' (1.524 m)  Wt 184 lb (83.462 kg)  BMI 35.94 kg/m2  SpO2 91%    Review of Systems She denies diarrhea.    Objective:   Physical Exam VITAL SIGNS:  See vs page GENERAL: no distress Pulses: dorsalis pedis intact bilat.   MSK: no deformity of the feet CV: trace bilat leg edema.   Skin:  no ulcer on the feet.  normal color and temp on the feet. Neuro: sensation is intact to touch on the feet.  Lab Results  Component Value Date   HGBA1C 8.4* 08/26/2014       Assessment & Plan:  DM: moderate exacerbation   Patient is advised the following: Patient Instructions  check your blood sugar once a day.  vary the time of day when you check, between before the 3 meals, and at bedtime.  also check if you have symptoms of your blood sugar being too high or too low.  please keep a record of the readings and bring it to your next appointment here.  You can  write it on any piece of paper.  please call us sooner if your blood sugar goes below 70, or if  you have a lot of readings over 200. Blood and urine tests are being requested for you today.  We'll let you know about the results.   If it is high, we'll add "[place]."   Please come back for a follow-up appointment in 3 months.    addendum: i have sent a prescription to your pharmacy, to add [place].

## 2014-08-26 NOTE — Patient Instructions (Addendum)
check your blood sugar once a day.  vary the time of day when you check, between before the 3 meals, and at bedtime.  also check if you have symptoms of your blood sugar being too high or too low.  please keep a record of the readings and bring it to your next appointment here.  You can write it on any piece of paper.  please call us sooner if your blood sugar goes below 70, or if you have a lot of readings over 200. Blood and urine tests are being requested for you today.  We'll let you know about the results.   If it is high, we'll add "[place]."   Please come back for a follow-up appointment in 3 months.

## 2014-08-30 ENCOUNTER — Telehealth: Payer: Self-pay | Admitting: Endocrinology

## 2014-08-30 NOTE — Telephone Encounter (Signed)
Patient would like for Dr. Loanne Drilling to know the new medication he prescribed she cannot afford it     Please advise    Thank you

## 2014-08-31 ENCOUNTER — Other Ambulatory Visit: Payer: Self-pay

## 2014-08-31 MED ORDER — BROMOCRIPTINE MESYLATE 2.5 MG PO TABS: 1.2500 mg | ORAL_TABLET | Freq: Every day | ORAL | Status: DC

## 2014-08-31 MED ORDER — FLUOXETINE HCL 40 MG PO CAPS: 40.0000 mg | ORAL_CAPSULE | Freq: Every day | ORAL | Status: DC

## 2014-08-31 NOTE — Telephone Encounter (Signed)
Ok, i have sent a prescription to your pharmacy  

## 2014-08-31 NOTE — Telephone Encounter (Signed)
See note below and please advise. PT wanted to know if alternative medication could be called in for Januvia.  Thanks!

## 2014-08-31 NOTE — Telephone Encounter (Signed)
Pt advised of note below and voiced understanding.  

## 2014-08-31 NOTE — Telephone Encounter (Signed)
This message was attached in error to message from your office.

## 2014-08-31 NOTE — Telephone Encounter (Signed)
Received another req for RF of prozac. See last message on 08/14/14 message. Do you want to deny this time or give more? We were down to 7 last time, if you don't want to deny, I guess we could try 3-4?

## 2014-09-01 ENCOUNTER — Telehealth: Payer: Self-pay | Admitting: Endocrinology

## 2014-09-01 NOTE — Telephone Encounter (Signed)
Spoke with pt. Pt stated the Januvia 100 mg needed to be cancelled out at her pharmacy. Pharmacy notified to d/c medication.

## 2014-09-01 NOTE — Telephone Encounter (Signed)
Patient has questions about medication Bromocriptine, and stated that the other medication he called into her pharmacy she couldn't afford, she didn't know the name of it. Please advise

## 2014-09-05 ENCOUNTER — Ambulatory Visit: Payer: No Typology Code available for payment source | Admitting: Family Medicine

## 2014-09-12 ENCOUNTER — Ambulatory Visit (INDEPENDENT_AMBULATORY_CARE_PROVIDER_SITE_OTHER): Payer: Medicare Other | Admitting: Emergency Medicine

## 2014-09-12 VITALS — BP 142/70 | HR 90 | Temp 97.2°F | Resp 16 | Ht 60.0 in | Wt 184.8 lb

## 2014-09-12 DIAGNOSIS — M5432 Sciatica, left side: Secondary | ICD-10-CM

## 2014-09-12 MED ORDER — TRAMADOL HCL 50 MG PO TABS: 50.0000 mg | ORAL_TABLET | Freq: Four times a day (QID) | ORAL | Status: DC | PRN

## 2014-09-12 MED ORDER — FLUOXETINE HCL 40 MG PO CAPS: 40.0000 mg | ORAL_CAPSULE | Freq: Every day | ORAL | Status: DC

## 2014-09-12 NOTE — Progress Notes (Signed)
[facility] [address] [place] [address] [phone]  Date:  09/12/2014   Name:  Destiny Ballard   DOB:  02-01-1951   MRN:  [id number]  PCP:  Lamar Blinks, MD    Chief Complaint: Back Pain and Medication Refill   History of Present Illness:  Destiny Ballard is a 64 y.o. very pleasant female patient who presents with the following:  Patient has a 3 month history of increasing low back pain. She has no hitorty of injury Worse when up and around, cleaning, bending. Better when lays down Motrin semi relieves pain. Some weakness in left leg.  No falls  Left buttock consistently numb. Pain radiates to the knee.   Never imaged. No improvement with over the counter medications or other home remedies.  Denies other complaint or health concern today.  Patient Active Problem List   Diagnosis Date Noted  . Diabetes 08/26/2014  . OSA (obstructive sleep apnea) 05/06/2014  . Anorectal abscess 02/17/2014  . Allergic rhinitis 10/14/2013  . Other and unspecified noninfectious gastroenteritis and colitis 10/06/2012  . Dyspnea on exertion 09/02/2012  . Chest discomfort 09/02/2012  . Abscess of axilla 01/05/2012  . Cellulitis of axilla, left 01/05/2012  . Chest pain 12/10/2011  . HYPERLIPIDEMIA 05/11/2010  . MORBID OBESITY 05/11/2010  . HYPERTENSION 05/11/2010  . HEADACHE, CHRONIC 05/11/2010  . COUGH 05/11/2010  . ANEMIA-IRON DEFICIENCY 06/28/2008  . CORONARY ARTERY DISEASE 06/28/2008  . GERD 06/28/2008    Past Medical History  Diagnosis Date  . Hypertension   . Hyperlipidemia   . Obesity   . Anxiety     Prior suicide attempt  . Depression   . Insulin resistance   . Iron deficiency anemia   . GERD (gastroesophageal reflux disease)   . Cervical spondylosis   . CAD (coronary artery disease)     a) s/p DES to LAD 07/2005 b) Last Myoview low risk 11/2011 showing small fixed apical perfusion defect (prior MI vs attenuation) but no ischemia - normal  EF.  . Diabetes mellitus without complication     Past Surgical History  Procedure Laterality Date  . Cardiac catheterization  06/17/2007    NORMAL. EF 60%  . Coronary stent placement  07/2005    LEFT ANTERIOR DESCENDING  . Tubal ligation    . Tonsillectomy and adenoidectomy    . Childbirth      X3  . Breast enhancement surgery    . Cervical spondylosis      SINGLE LEVEL FUSION  . Incision and drainage breast abscess  01/05/2012       . Forearm fracture surgery  2010    hand and shoulder   . Incision and drainage perirectal abscess N/A 02/18/2014    Procedure: IRRIGATION AND DEBRIDEMENT PERIRECTAL ABSCESS;  Surgeon: Pedro Earls, MD;  Location: [facility];  Service: General;  Laterality: N/A;  . Rotator cuff repair      bilaterla    History  Substance Use Topics  . Smoking status: Never Smoker   . Smokeless tobacco: Never Used  . Alcohol Use: No    Family History  Problem Relation Age of Onset  . Heart attack Mother   . Diabetes Mother   . Lung cancer Mother   . Asthma Mother   . Heart disease Mother   . Suicidality Father     "killed himself"  . Allergies Other     all family--seasonal allergies  . Asthma Daughter  x 2  . Cancer Daughter     pre-cancerous polyp  . Diabetes Sister   . Cancer Sister   . Cervical cancer Daughter     cervical     Allergies  Allergen Reactions  . Prednisone Other (See Comments)    REACTION: mood swings, nightmares. "Shot doesn't bother me, reaction is just with the pill"    Medication list has been reviewed and updated.  Current Outpatient Prescriptions on File Prior to Visit  Medication Sig Dispense Refill  . albuterol (PROVENTIL HFA;VENTOLIN HFA) 108 (90 BASE) MCG/ACT inhaler Inhale 1 puff into the lungs every 6 (six) hours as needed for shortness of breath.    [name] amLODipine (NORVASC) 10 MG tablet Take 10 mg by mouth daily with breakfast.    . aspirin EC 81 MG tablet Take 81 mg by mouth daily.    . benzonatate  (TESSALON) 100 MG capsule Take 200 mg by mouth 2 (two) times daily as needed for cough.    . bromocriptine (PARLODEL) 2.5 MG tablet Take 0.5 tablets (1.25 mg total) by mouth at bedtime. 15 tablet 11  . Cholecalciferol (VITAMIN D3) 1000 UNITS CAPS Take 1,000 Units by mouth daily.     . diphenhydramine-acetaminophen (TYLENOL PM) 25-500 MG TABS Take 1 tablet by mouth at bedtime as needed (for sleep).    . fexofenadine (ALLEGRA) 180 MG tablet Take 180 mg by mouth 2 (two) times daily.     [name] FLUoxetine (PROZAC) 40 MG capsule Take 1 capsule (40 mg total) by mouth daily. 90 capsule 3  . fluticasone (VERAMYST) 27.5 MCG/SPRAY nasal spray Place 2 sprays into the nose daily as needed for rhinitis.     [name] ibuprofen (ADVIL,MOTRIN) 200 MG tablet Take 800 mg by mouth every 6 (six) hours as needed for moderate pain.     . Ibuprofen-Diphenhydramine Cit (MOTRIN PM) 200-38 MG TABS Take 1 tablet by mouth at bedtime as needed (for sleep).    . metFORMIN (GLUCOPHAGE-XR) 500 MG 24 hr tablet 4 tabs daily 360 tablet 1  . metoprolol succinate (TOPROL-XL) 100 MG 24 hr tablet Take 1 tablet (100 mg total) by mouth daily with breakfast. Take with or immediately following a meal. 30 tablet 6  . montelukast (SINGULAIR) 10 MG tablet Take 1 tablet (10 mg total) by mouth at bedtime. 90 tablet 3  . nitroGLYCERIN (NITROSTAT) 0.4 MG SL tablet Place 0.4 mg under the tongue every 5 (five) minutes as needed.    [name] omeprazole (PRILOSEC) 40 MG capsule Take 40 mg by mouth daily with breakfast.    . rosuvastatin (CRESTOR) 20 MG tablet Take 20 mg by mouth daily with breakfast.    . FLUoxetine (PROZAC) 40 MG capsule Take 1 capsule (40 mg total) by mouth daily. NO MORE REFILLS WITHOUT OFFICE VISIT - 4TH AND FINAL NOTICE (Patient not taking: Reported on 09/12/2014) 15 capsule 0  . HYDROcodone-acetaminophen (NORCO/VICODIN) 5-325 MG per tablet Take 1-2 tablets by mouth every 4 (four) hours as needed. (Patient not taking: Reported on 09/12/2014) 15 tablet  0  . mupirocin ointment (BACTROBAN) 2 % Place 1 application into the nose 2 (two) times daily. (Patient not taking: Reported on 09/12/2014) 22 g 0   No current facility-administered medications on file prior to visit.    Review of Systems:  As per HPI, otherwise negative.    Physical Examination: Filed Vitals:   09/12/14 1419  BP: 142/70  Pulse: 90  Temp: 97.2 F (36.2 C)  Resp: 16   Filed  Vitals:   09/12/14 1419  Height: 5' (1.524 m)  Weight: 184 lb 12.8 oz (83.825 kg)   Body mass index is 36.09 kg/(m^2). Ideal Body Weight: Weight in (lb) to have BMI = 25: 127.7   GEN: WDWN, NAD, Non-toxic, Alert & Oriented x 3 HEENT: Atraumatic, Normocephalic.  Ears and Nose: No external deformity. EXTR: No clubbing/cyanosis/edema NEURO: Normal gait.  PSYCH: Normally interactive. Conversant. Not depressed or anxious appearing.  Calm demeanor.  Marked tender left sciatic neuritis  Motor intact  Assessment and Plan: Sciatic neuritis Tramadol MR  Signed,  Ellison Carwin, MD

## 2014-09-12 NOTE — Patient Instructions (Signed)
Sciatica Sciatica is pain, weakness, numbness, or tingling along the path of the sciatic nerve. The nerve starts in the lower back and runs down the back of each leg. The nerve controls the muscles in the lower leg and in the back of the knee, while also providing sensation to the back of the thigh, lower leg, and the sole of your foot. Sciatica is a symptom of another medical condition. For instance, nerve damage or certain conditions, such as a herniated disk or bone spur on the spine, pinch or put pressure on the sciatic nerve. This causes the pain, weakness, or other sensations normally associated with sciatica. Generally, sciatica only affects one side of the body. CAUSES   Herniated or slipped disc.  Degenerative disk disease.  A pain disorder involving the narrow muscle in the buttocks (piriformis syndrome).  Pelvic injury or fracture.  Pregnancy.  Tumor (rare). SYMPTOMS  Symptoms can vary from mild to very severe. The symptoms usually travel from the low back to the buttocks and down the back of the leg. Symptoms can include:  Mild tingling or dull aches in the lower back, leg, or hip.  Numbness in the back of the calf or sole of the foot.  Burning sensations in the lower back, leg, or hip.  Sharp pains in the lower back, leg, or hip.  Leg weakness.  Severe back pain inhibiting movement. These symptoms may get worse with coughing, sneezing, laughing, or prolonged sitting or standing. Also, being overweight may worsen symptoms. DIAGNOSIS  Your caregiver will perform a physical exam to look for common symptoms of sciatica. He or she may ask you to do certain movements or activities that would trigger sciatic nerve pain. Other tests may be performed to find the cause of the sciatica. These may include:  Blood tests.  X-rays.  Imaging tests, such as an MRI or CT scan. TREATMENT  Treatment is directed at the cause of the sciatic pain. Sometimes, treatment is not necessary  and the pain and discomfort goes away on its own. If treatment is needed, your caregiver may suggest:  Over-the-counter medicines to relieve pain.  Prescription medicines, such as anti-inflammatory medicine, muscle relaxants, or narcotics.  Applying heat or ice to the painful area.  Steroid injections to lessen pain, irritation, and inflammation around the nerve.  Reducing activity during periods of pain.  Exercising and stretching to strengthen your abdomen and improve flexibility of your spine. Your caregiver may suggest losing weight if the extra weight makes the back pain worse.  Physical therapy.  Surgery to eliminate what is pressing or pinching the nerve, such as a bone spur or part of a herniated disk. HOME CARE INSTRUCTIONS   Only take over-the-counter or prescription medicines for pain or discomfort as directed by your caregiver.  Apply ice to the affected area for 20 minutes, 3-4 times a day for the first 48-72 hours. Then try heat in the same way.  Exercise, stretch, or perform your usual activities if these do not aggravate your pain.  Attend physical therapy sessions as directed by your caregiver.  Keep all follow-up appointments as directed by your caregiver.  Do not wear high heels or shoes that do not provide proper support.  Check your mattress to see if it is too soft. A firm mattress may lessen your pain and discomfort. SEEK IMMEDIATE MEDICAL CARE IF:   You lose control of your bowel or bladder (incontinence).  You have increasing weakness in the lower back, pelvis, buttocks,   or legs.  You have redness or swelling of your back.  You have a burning sensation when you urinate.  You have pain that gets worse when you lie down or awakens you at night.  Your pain is worse than you have experienced in the past.  Your pain is lasting longer than 4 weeks.  You are suddenly losing weight without reason. MAKE SURE YOU:  Understand these  instructions.  Will watch your condition.  Will get help right away if you are not doing well or get worse. Document Released: 06/25/2001 Document Revised: 12/31/2011 Document Reviewed: 11/10/2011 ExitCare Patient Information 2015 [employer]. This information is not intended to replace advice given to you by your health care provider. Make sure you discuss any questions you have with your health care provider.  

## 2014-09-12 NOTE — Addendum Note (Signed)
Addended by: Roselee Culver on: 09/12/2014 02:55 PM   Modules accepted: Orders

## 2014-09-20 ENCOUNTER — Other Ambulatory Visit: Payer: Self-pay | Admitting: Nurse Practitioner

## 2014-09-23 ENCOUNTER — Ambulatory Visit: Payer: No Typology Code available for payment source | Admitting: Nurse Practitioner

## 2014-09-26 ENCOUNTER — Encounter: Payer: No Typology Code available for payment source | Admitting: Physician Assistant

## 2014-09-28 NOTE — Progress Notes (Signed)
Patient cancelled

## 2014-10-10 ENCOUNTER — Other Ambulatory Visit: Payer: Self-pay

## 2014-10-10 MED ORDER — FLUOXETINE HCL 40 MG PO CAPS: 40.0000 mg | ORAL_CAPSULE | Freq: Every day | ORAL | Status: DC

## 2014-10-11 ENCOUNTER — Other Ambulatory Visit: Payer: Self-pay | Admitting: Physician Assistant

## 2014-10-18 ENCOUNTER — Inpatient Hospital Stay: Admission: RE | Admit: 2014-10-18 | Payer: No Typology Code available for payment source | Source: Ambulatory Visit

## 2014-10-25 ENCOUNTER — Other Ambulatory Visit: Payer: Medicare Other

## 2014-10-25 ENCOUNTER — Ambulatory Visit
Admission: RE | Admit: 2014-10-25 | Discharge: 2014-10-25 | Disposition: A | Discharge status: Home / Self Care | Payer: Medicare Other | Source: Ambulatory Visit | Attending: Emergency Medicine | Admitting: Emergency Medicine

## 2014-10-25 DIAGNOSIS — M5432 Sciatica, left side: Secondary | ICD-10-CM

## 2014-11-07 ENCOUNTER — Ambulatory Visit
Admission: RE | Admit: 2014-11-07 | Discharge: 2014-11-07 | Disposition: A | Discharge status: Home / Self Care | Payer: PRIVATE HEALTH INSURANCE | Source: Ambulatory Visit | Attending: Emergency Medicine | Admitting: Emergency Medicine

## 2014-11-07 DIAGNOSIS — M4806 Spinal stenosis, lumbar region: Secondary | ICD-10-CM | POA: Diagnosis not present

## 2014-11-07 DIAGNOSIS — M5126 Other intervertebral disc displacement, lumbar region: Secondary | ICD-10-CM | POA: Diagnosis not present

## 2014-11-08 ENCOUNTER — Other Ambulatory Visit: Payer: Self-pay | Admitting: Emergency Medicine

## 2014-11-08 DIAGNOSIS — M5432 Sciatica, left side: Secondary | ICD-10-CM

## 2014-11-09 ENCOUNTER — Other Ambulatory Visit: Payer: Self-pay

## 2014-11-09 ENCOUNTER — Telehealth: Payer: Self-pay

## 2014-11-09 MED ORDER — METOPROLOL SUCCINATE ER 100 MG PO TB24: 100.0000 mg | ORAL_TABLET | Freq: Every day | ORAL | Status: DC

## 2014-11-09 MED ORDER — MONTELUKAST SODIUM 10 MG PO TABS: 10.0000 mg | ORAL_TABLET | Freq: Every day | ORAL | Status: DC

## 2014-11-09 NOTE — Telephone Encounter (Signed)
Pt is wanting to know that she would llike to be referred to neurosurgery

## 2014-11-09 NOTE — Telephone Encounter (Signed)
Pt notified that she has been set up for referral.

## 2014-11-13 ENCOUNTER — Ambulatory Visit (INDEPENDENT_AMBULATORY_CARE_PROVIDER_SITE_OTHER): Payer: Medicare Other | Admitting: Physician Assistant

## 2014-11-13 VITALS — BP 140/78 | HR 112 | Temp 97.9°F | Resp 20 | Ht 60.0 in | Wt 180.0 lb

## 2014-11-13 DIAGNOSIS — L237 Allergic contact dermatitis due to plants, except food: Secondary | ICD-10-CM | POA: Diagnosis not present

## 2014-11-13 MED ORDER — CETIRIZINE HCL 10 MG PO TABS: 10.0000 mg | ORAL_TABLET | Freq: Every day | ORAL | Status: DC

## 2014-11-13 MED ORDER — HYDROXYZINE HCL 25 MG PO TABS: 12.5000 mg | ORAL_TABLET | Freq: Every evening | ORAL | Status: DC | PRN

## 2014-11-13 NOTE — Patient Instructions (Signed)
Poison Ivy  Poison ivy is a rash caused by touching the leaves of the poison ivy plant. The rash often shows up 48 hours later. You might just have bumps, redness, and itching. Sometimes, blisters appear and break open. Your eyes may get puffy (swollen). Poison ivy often heals in 2 to 3 weeks without treatment.  HOME CARE  · If you touch poison ivy:  ¨ Wash your skin with soap and water right away. Wash under your fingernails. Do not rub the skin very hard.  ¨ Wash any clothes you were wearing.  · Avoid poison ivy in the future. Poison ivy has 3 leaves on a stem.  · Use medicine to help with itching as told by your doctor. Do not drive when you take this medicine.  · Keep open sores dry, clean, and covered with a bandage and medicated cream, if needed.  · Ask your doctor about medicine for children.  GET HELP RIGHT AWAY IF:  · You have open sores.  · Redness spreads beyond the area of the rash.  · There is yellowish white fluid (pus) coming from the rash.  · Pain gets worse.  · You have a temperature by mouth above 102° F (38.9° C), not controlled by medicine.  MAKE SURE YOU:  · Understand these instructions.  · Will watch your condition.  · Will get help right away if you are not doing well or get worse.  Document Released: 08/03/2010 Document Revised: 09/23/2011 Document Reviewed: 08/03/2010  ExitCare® Patient Information ©2015 [employer]. This information is not intended to replace advice given to you by your health care provider. Make sure you discuss any questions you have with your health care provider.

## 2014-11-14 ENCOUNTER — Other Ambulatory Visit: Payer: Self-pay

## 2014-11-14 ENCOUNTER — Encounter: Payer: Self-pay | Admitting: Physician Assistant

## 2014-11-14 MED ORDER — BROMOCRIPTINE MESYLATE 2.5 MG PO TABS: 1.2500 mg | ORAL_TABLET | Freq: Every day | ORAL | Status: DC

## 2014-11-14 NOTE — Progress Notes (Signed)
   Subjective:    Patient ID: Destiny Ballard, female    DOB: 1951-04-08, 64 y.o.   MRN: [id number]  HPI Patient presents for rash following secondhand exposure to poison ivy on her chest and left breast. Is caring for a baby squirrel that got into some poison ivy and when she let squirrel back in he nestled into her bosom. Rash itches, but is not painful. Has used calamine lotion with some relief. Denies wheezing, SOB, or fever.   Review of Systems  Constitutional: Negative for fever.  Respiratory: Negative for shortness of breath and wheezing.   Skin: Positive for rash. Negative for color change.       Objective:   Physical Exam  Constitutional: She is oriented to person, place, and time. She appears well-developed and well-nourished. No distress.  Blood pressure 140/78, pulse 112, temperature 97.9 F (36.6 C), temperature source Oral, resp. rate 20, height 5' (1.524 m), weight 180 lb (81.647 kg), SpO2 94 %.  HENT:  Head: Normocephalic and atraumatic.  Right Ear: External ear normal.  Left Ear: External ear normal.  Mouth/Throat: Oropharynx is clear and moist.  Eyes: Conjunctivae are normal. Pupils are equal, round, and reactive to light. Right eye exhibits no discharge. Left eye exhibits no discharge.  Neck: Normal range of motion. Neck supple.  Cardiovascular: Normal rate, regular rhythm and normal heart sounds.  Exam reveals no gallop and no friction rub.   No murmur heard. Pulmonary/Chest: Effort normal and breath sounds normal. No respiratory distress. She has no wheezes. She has no rales.  Lymphadenopathy:    She has no cervical adenopathy.  Neurological: She is alert and oriented to person, place, and time.  Skin: Skin is warm and dry. Rash (contact dermatitis) noted. She is not diaphoretic. No erythema. No pallor.       Assessment & Plan:  1. Poison ivy dermatitis Can continue calamine lotion or use OTC cortizone cream. - hydrOXYzine (ATARAX/VISTARIL) 25 MG tablet;  Take 0.5-1 tablets (12.5-25 mg total) by mouth at bedtime as needed for itching.  Dispense: 30 tablet; Refill: 0 - cetirizine (ZYRTEC) 10 MG tablet; Take 1 tablet (10 mg total) by mouth daily.  Dispense: 30 tablet; Refill: [place] PA-C  [facility] 11/16/2014 9:15 AM

## 2014-11-16 ENCOUNTER — Other Ambulatory Visit: Payer: Self-pay | Admitting: Nurse Practitioner

## 2014-11-24 ENCOUNTER — Other Ambulatory Visit: Payer: Self-pay | Admitting: Physician Assistant

## 2014-11-24 ENCOUNTER — Ambulatory Visit: Payer: No Typology Code available for payment source | Admitting: Endocrinology

## 2014-11-24 ENCOUNTER — Ambulatory Visit (INDEPENDENT_AMBULATORY_CARE_PROVIDER_SITE_OTHER): Payer: Medicare Other | Admitting: Family Medicine

## 2014-11-24 VITALS — BP 122/66 | HR 92 | Temp 98.0°F | Resp 18 | Ht 60.0 in | Wt 190.0 lb

## 2014-11-24 DIAGNOSIS — L237 Allergic contact dermatitis due to plants, except food: Secondary | ICD-10-CM

## 2014-11-24 DIAGNOSIS — L282 Other prurigo: Secondary | ICD-10-CM | POA: Diagnosis not present

## 2014-11-24 DIAGNOSIS — Z0289 Encounter for other administrative examinations: Secondary | ICD-10-CM

## 2014-11-24 DIAGNOSIS — E1165 Type 2 diabetes mellitus with hyperglycemia: Secondary | ICD-10-CM

## 2014-11-24 DIAGNOSIS — M545 Low back pain: Secondary | ICD-10-CM

## 2014-11-24 MED ORDER — HYDROXYZINE HCL 25 MG PO TABS: 12.5000 mg | ORAL_TABLET | Freq: Every evening | ORAL | Status: DC | PRN

## 2014-11-24 MED ORDER — METHYLPREDNISOLONE ACETATE 80 MG/ML IJ SUSP
80.0000 mg | Freq: Once | INTRAMUSCULAR | Status: AC
  Administered 2014-11-24: 80 mg via INTRAMUSCULAR

## 2014-11-24 NOTE — Progress Notes (Signed)
Chief Complaint:  Chief Complaint  Patient presents with  . Poison Ivy    x2 weeks     HPI: Destiny Ballard is a 64 y.o. female who is here for  Recheck of her posion ivy which is not improving. She basically got her poison ivy dermatitis from cuddling up with the squirrel. She has a pet squirrel that comes into the house she doesn't keep inside the house but comes into her house and legs to sit on her chest. She denies any fevers or chills, using atarax. No SI/HI/hallucinations. She had depomederol in 2013 with Dr Everlene Farrier and did fine. She was given Atarax and also Zyrtec to take but they have not relieved her of her symptoms. She denies chest pain or shortness of breath or any superimposed infection  She was seen here in favor 2016 by Dr. Ouida Sills, please see note below, who diagnosed her with sciatica. She would like to see [facility] again since they were the one who did her cervical spine surgery. She continues to have chronic numbness and tingling. This is not relieved by over-the-counter medications. She did not go see them because she did not hear from referrals.  Patient has a 3 month history of increasing low back pain. She has no hitorty of injury Worse when up and around, cleaning, bending. Better when lays down Motrin semi relieves pain. Some weakness in left leg. No falls Left buttock consistently numb. Pain radiates to the knee.  Never imaged. No improvement with over the counter medications or other home remedies.  Past Medical History  Diagnosis Date  . Hypertension   . Hyperlipidemia   . Obesity   . Anxiety     Prior suicide attempt  . Depression   . Insulin resistance   . Iron deficiency anemia   . GERD (gastroesophageal reflux disease)   . Cervical spondylosis   . CAD (coronary artery disease)     a) s/p DES to LAD 07/2005 b) Last Myoview low risk 11/2011 showing small fixed apical perfusion defect (prior MI vs attenuation) but no ischemia -  normal EF.  . Diabetes mellitus without complication    Past Surgical History  Procedure Laterality Date  . Cardiac catheterization  06/17/2007    NORMAL. EF 60%  . Coronary stent placement  07/2005    LEFT ANTERIOR DESCENDING  . Tubal ligation    . Tonsillectomy and adenoidectomy    . Childbirth      X3  . Breast enhancement surgery    . Cervical spondylosis      SINGLE LEVEL FUSION  . Incision and drainage breast abscess  01/05/2012       . Forearm fracture surgery  2010    hand and shoulder   . Incision and drainage perirectal abscess N/A 02/18/2014    Procedure: IRRIGATION AND DEBRIDEMENT PERIRECTAL ABSCESS;  Surgeon: Pedro Earls, MD;  Location: [facility];  Service: General;  Laterality: N/A;  . Rotator cuff repair      bilaterla  . Bulging disk      lower part of back   History   Social History  . Marital Status: Married    Spouse Name: Lake Bells  . Number of Children: 3  . Years of Education: 12   Occupational History  . retired from [employer]     11/2012   Social History Main Topics  . Smoking status: Never Smoker   . Smokeless tobacco: Never Used  . Alcohol  Use: No  . Drug Use: No  . Sexual Activity:    Partners: Male    Birth Control/ Protection: None   Other Topics Concern  . None   Social History Narrative   Lives with her husband.  Their eldest daughter lives upstairs.   Family History  Problem Relation Age of Onset  . Heart attack Mother   . Diabetes Mother   . Lung cancer Mother   . Asthma Mother   . Heart disease Mother   . Suicidality Father     "killed himself"  . Allergies Other     all family--seasonal allergies  . Asthma Daughter     x 2  . Cancer Daughter     pre-cancerous polyp  . Diabetes Sister   . Cancer Sister   . Cervical cancer Daughter     cervical    Allergies  Allergen Reactions  . Prednisone Other (See Comments)    REACTION: mood swings, nightmares. "Shot doesn't bother me, reaction is just with the pill" she states she  has had the steroid injections before. From our records methylprednisone was given to her in 2013 without any complications.   Prior to Admission medications   Medication Sig Start Date End Date Taking? Authorizing Provider  albuterol (PROVENTIL HFA;VENTOLIN HFA) 108 (90 BASE) MCG/ACT inhaler Inhale 1 puff into the lungs every 6 (six) hours as needed for shortness of breath.   Yes Historical Provider, MD  amLODipine (NORVASC) 10 MG tablet TAKE 1 TABLET BY MOUTH EVERY DAY 09/21/14  Yes Larey Dresser, MD  aspirin EC 81 MG tablet Take 81 mg by mouth daily.   Yes Historical Provider, MD  benzonatate (TESSALON) 100 MG capsule Take 200 mg by mouth 2 (two) times daily as needed for cough.   Yes Historical Provider, MD  bromocriptine (PARLODEL) 2.5 MG tablet Take 0.5 tablets (1.25 mg total) by mouth at bedtime. 11/14/14  Yes Renato Shin, MD  cetirizine (ZYRTEC) 10 MG tablet Take 1 tablet (10 mg total) by mouth daily. 11/13/14  Yes Tishira R Brewington, PA-C  Cholecalciferol (VITAMIN D3) 1000 UNITS CAPS Take 1,000 Units by mouth daily.    Yes Historical Provider, MD  CRESTOR 20 MG tablet TAKE 1 TABLET BY MOUTH EVERY DAY 11/16/14  Yes Burtis Junes, NP  Cyanocobalamin (VITAMIN B 12 PO) Take 500 mg by mouth 2 (two) times daily.   Yes Historical Provider, MD  diphenhydramine-acetaminophen (TYLENOL PM) 25-500 MG TABS Take 1 tablet by mouth at bedtime as needed (for sleep).   Yes Historical Provider, MD  fexofenadine (ALLEGRA) 180 MG tablet Take 180 mg by mouth 2 (two) times daily.    Yes Historical Provider, MD  FLUoxetine (PROZAC) 40 MG capsule Take 1 capsule (40 mg total) by mouth daily. 09/10/13  Yes Burtis Junes, NP  fluticasone (VERAMYST) 27.5 MCG/SPRAY nasal spray Place 2 sprays into the nose daily as needed for rhinitis.    Yes Historical Provider, MD  hydrOXYzine (ATARAX/VISTARIL) 25 MG tablet Take 0.5-1 tablets (12.5-25 mg total) by mouth at bedtime as needed for itching. 11/13/14  Yes Tishira R  Brewington, PA-C  ibuprofen (ADVIL,MOTRIN) 200 MG tablet Take 800 mg by mouth every 6 (six) hours as needed for moderate pain.    Yes Historical Provider, MD  metFORMIN (GLUCOPHAGE-XR) 500 MG 24 hr tablet 4 tabs daily 08/10/14  Yes Renato Shin, MD  metoprolol succinate (TOPROL-XL) 100 MG 24 hr tablet Take 1 tablet (100 mg total) by mouth daily with  breakfast. Take with or immediately following a meal. 11/09/14  Yes Larey Dresser, MD  montelukast (SINGULAIR) 10 MG tablet Take 1 tablet (10 mg total) by mouth at bedtime. NO MORE REFILLS WITHOUT OFFICE VISIT - 2ND NOTICE 11/09/14  Yes Dorian Heckle English, PA  nitroGLYCERIN (NITROSTAT) 0.4 MG SL tablet Place 0.4 mg under the tongue every 5 (five) minutes as needed. 12/11/11  Yes Dayna N Dunn, PA-C  omeprazole (PRILOSEC) 40 MG capsule Take 40 mg by mouth daily with breakfast.   Yes Historical Provider, MD  rosuvastatin (CRESTOR) 20 MG tablet Take 20 mg by mouth daily with breakfast.   Yes Historical Provider, MD  vitamin E 400 UNIT capsule Take 400 Units by mouth daily.   Yes Historical Provider, MD  HYDROcodone-acetaminophen (NORCO/VICODIN) 5-325 MG per tablet Take 1-2 tablets by mouth every 4 (four) hours as needed. Patient not taking: Reported on 11/24/2014 07/11/14   Glendell Docker, NP  traMADol (ULTRAM) 50 MG tablet Take 1 tablet (50 mg total) by mouth every 6 (six) hours as needed. Patient not taking: Reported on 11/13/2014 09/12/14   Roselee Culver, MD     ROS: The patient denies fevers, chills, night sweats, unintentional weight loss, chest pain, palpitations, wheezing, dyspnea on exertion, nausea, vomiting, abdominal pain, dysuria, hematuria, melena, numbness, weakness, or tingling.   All other systems have been reviewed and were otherwise negative with the exception of those mentioned in the HPI and as above.    PHYSICAL EXAM: Filed Vitals:   11/24/14 1400  BP: 122/66  Pulse: 92  Temp: 98 F (36.7 C)  Resp: 18   Filed Vitals:    11/24/14 1400  Height: 5' (1.524 m)  Weight: 190 lb (86.183 kg)   Body mass index is 37.11 kg/(m^2).  General: Alert, no acute distress HEENT:  Normocephalic, atraumatic, oropharynx patent. EOMI, PERRLA Cardiovascular:  Regular rate and rhythm, no rubs murmurs or gallops.  No Carotid bruits, radial pulse intact. No pedal edema.  Respiratory: Clear to auscultation bilaterally.  No wheezes, rales, or rhonchi.  No cyanosis, no use of accessory musculature GI: No organomegaly, abdomen is soft and non-tender, positive bowel sounds.  No masses. Skin: Positive post dermatitis, diffuse Neurologic: Facial musculature symmetric. Psychiatric: Patient is appropriate throughout our interaction. Lymphatic: No cervical lymphadenopathy Musculoskeletal: Gait intact.   LABS: Results for orders placed or performed in visit on 08/26/14  Hemoglobin A1c  Result Value Ref Range   Hgb A1c MFr Bld 8.4 (H) 4.6 - 6.5 %  Microalbumin / creatinine urine ratio  Result Value Ref Range   Microalb, Ur 4.7 (H) 0.0 - 1.9 mg/dL   Creatinine,U 114.8 mg/dL   Microalb Creat Ratio 4.1 0.0 - 30.0 mg/g     EKG/XRAY:   Primary read interpreted by Dr. Marin Comment at [facility].   ASSESSMENT/PLAN: Encounter Diagnoses  Name Primary?  . Poison ivy dermatitis Yes  . Prurigo   . Midline low back pain, with sciatica presence unspecified   . Type 2 diabetes mellitus with hyperglycemia    64 year old female with a past medical history of poorly controlled diabetes who is here for worsening symptoms of poison ivy dermatitis. Since her symptoms have worsened I will go ahead and give her some steroid. She will get steroid injection 1. Continue with Zyrtec and Vistaril when necessary. Monitor glucose more closely Referred to neurosurgery  Gross sideeffects, risk and benefits, and alternatives of medications d/w patient. Patient is aware that all medications have potential sideeffects and we are  unable to predict every sideeffect or  drug-drug interaction that may occur.  Alexander Mcauley, [place], DO 11/24/2014 4:25 PM

## 2014-11-25 ENCOUNTER — Telehealth: Payer: Self-pay

## 2014-11-25 MED ORDER — MONTELUKAST SODIUM 10 MG PO TABS: 10.0000 mg | ORAL_TABLET | Freq: Every day | ORAL | Status: DC

## 2014-11-25 NOTE — Telephone Encounter (Signed)
Request from pharmacy Montelukast 10mg  tablet. Pt was seen recently but this medication was not discussed. Can we refill?

## 2014-12-09 DIAGNOSIS — M545 Low back pain: Secondary | ICD-10-CM | POA: Diagnosis not present

## 2014-12-09 DIAGNOSIS — Z6835 Body mass index (BMI) 35.0-35.9, adult: Secondary | ICD-10-CM | POA: Diagnosis not present

## 2014-12-09 DIAGNOSIS — M5416 Radiculopathy, lumbar region: Secondary | ICD-10-CM | POA: Diagnosis not present

## 2014-12-13 ENCOUNTER — Other Ambulatory Visit: Payer: Self-pay | Admitting: Family Medicine

## 2014-12-14 NOTE — Telephone Encounter (Signed)
Do you want to give RF?

## 2014-12-22 ENCOUNTER — Encounter: Payer: Self-pay | Admitting: *Deleted

## 2015-01-05 ENCOUNTER — Other Ambulatory Visit: Payer: Self-pay

## 2015-01-05 MED ORDER — MONTELUKAST SODIUM 10 MG PO TABS: 10.0000 mg | ORAL_TABLET | Freq: Every day | ORAL | Status: DC

## 2015-01-09 ENCOUNTER — Other Ambulatory Visit: Payer: Self-pay | Admitting: Family Medicine

## 2015-01-11 NOTE — Telephone Encounter (Signed)
Can we refill or RTC?

## 2015-01-11 NOTE — Telephone Encounter (Signed)
Yes, I'll refill this for her but she needs to return to recheck if she is still having her poison ivy rash.

## 2015-02-17 ENCOUNTER — Other Ambulatory Visit: Payer: Self-pay | Admitting: Cardiology

## 2015-02-17 ENCOUNTER — Other Ambulatory Visit: Payer: Self-pay | Admitting: Urgent Care

## 2015-02-20 ENCOUNTER — Encounter: Payer: Self-pay | Admitting: *Deleted

## 2015-02-20 ENCOUNTER — Other Ambulatory Visit: Payer: Self-pay

## 2015-02-20 MED ORDER — METOPROLOL SUCCINATE ER 100 MG PO TB24: ORAL_TABLET | ORAL | Status: DC

## 2015-02-20 NOTE — Telephone Encounter (Signed)
Dr Marin Comment, do you want to give RFs or RTC?

## 2015-03-20 ENCOUNTER — Other Ambulatory Visit: Payer: Self-pay | Admitting: Nurse Practitioner

## 2015-03-20 ENCOUNTER — Other Ambulatory Visit: Payer: Self-pay | Admitting: Emergency Medicine

## 2015-04-18 DIAGNOSIS — B373 Candidiasis of vulva and vagina: Secondary | ICD-10-CM | POA: Diagnosis not present

## 2015-04-18 DIAGNOSIS — M8580 Other specified disorders of bone density and structure, unspecified site: Secondary | ICD-10-CM | POA: Diagnosis not present

## 2015-04-18 DIAGNOSIS — Z124 Encounter for screening for malignant neoplasm of cervix: Secondary | ICD-10-CM | POA: Diagnosis not present

## 2015-04-18 DIAGNOSIS — E119 Type 2 diabetes mellitus without complications: Secondary | ICD-10-CM | POA: Diagnosis not present

## 2015-04-18 DIAGNOSIS — Z1231 Encounter for screening mammogram for malignant neoplasm of breast: Secondary | ICD-10-CM | POA: Diagnosis not present

## 2015-04-18 DIAGNOSIS — E559 Vitamin D deficiency, unspecified: Secondary | ICD-10-CM | POA: Diagnosis not present

## 2015-04-18 DIAGNOSIS — Z01419 Encounter for gynecological examination (general) (routine) without abnormal findings: Secondary | ICD-10-CM | POA: Diagnosis not present

## 2015-04-18 LAB — HM MAMMOGRAPHY

## 2015-04-18 LAB — HM PAP SMEAR: HM Pap smear: NEGATIVE

## 2015-04-25 DIAGNOSIS — E119 Type 2 diabetes mellitus without complications: Secondary | ICD-10-CM | POA: Diagnosis not present

## 2015-05-02 ENCOUNTER — Ambulatory Visit (INDEPENDENT_AMBULATORY_CARE_PROVIDER_SITE_OTHER): Payer: Medicare Other | Admitting: Endocrinology

## 2015-05-02 ENCOUNTER — Encounter: Payer: Self-pay | Admitting: Endocrinology

## 2015-05-02 VITALS — BP 138/76 | HR 100 | Temp 98.3°F | Ht 60.0 in | Wt 190.0 lb

## 2015-05-02 DIAGNOSIS — R197 Diarrhea, unspecified: Secondary | ICD-10-CM

## 2015-05-02 DIAGNOSIS — E1165 Type 2 diabetes mellitus with hyperglycemia: Secondary | ICD-10-CM

## 2015-05-02 LAB — POCT GLYCOSYLATED HEMOGLOBIN (HGB A1C): Hemoglobin A1C: 7.9

## 2015-05-02 LAB — HEMOGLOBIN A1C: Hgb A1c MFr Bld: 7.9 % — AB (ref 4.0–6.0)

## 2015-05-02 MED ORDER — METFORMIN HCL ER 500 MG PO TB24: 1000.0000 mg | ORAL_TABLET | Freq: Every day | ORAL | Status: DC

## 2015-05-02 MED ORDER — LIRAGLUTIDE 18 MG/3ML ~~LOC~~ SOPN: 1.8000 mg | PEN_INJECTOR | Freq: Every day | SUBCUTANEOUS | Status: DC

## 2015-05-02 NOTE — Progress Notes (Signed)
Subjective:    Patient ID: Destiny Ballard, female    DOB: Oct 05, 1950, 64 y.o.   MRN: [id number]  HPI Pt returns for f/u of diabetes mellitus: DM type: 2 Dx'ed: 1884 Complications: none Therapy: 2 oral meds GDM: never DKA: never Severe hypoglycemia: never.   Pancreatitis: never Other: she has never been on insulin; she could not afford [place].  Interval history: She denies weight change.  pt states she feels well in general, except for diarrhea.   Past Medical History  Diagnosis Date  . Hypertension   . Hyperlipidemia   . Obesity   . Anxiety     Prior suicide attempt  . Depression   . Insulin resistance   . Iron deficiency anemia   . GERD (gastroesophageal reflux disease)   . Cervical spondylosis   . CAD (coronary artery disease)     a) s/p DES to LAD 07/2005 b) Last Myoview low risk 11/2011 showing small fixed apical perfusion defect (prior MI vs attenuation) but no ischemia - normal EF.  . Diabetes mellitus without complication [facility])     Past Surgical History  Procedure Laterality Date  . Cardiac catheterization  06/17/2007    NORMAL. EF 60%  . Coronary stent placement  07/2005    LEFT ANTERIOR DESCENDING  . Tubal ligation    . Tonsillectomy and adenoidectomy    . Childbirth      X3  . Breast enhancement surgery    . Cervical spondylosis      SINGLE LEVEL FUSION  . Incision and drainage breast abscess  01/05/2012       . Forearm fracture surgery  2010    hand and shoulder   . Incision and drainage perirectal abscess N/A 02/18/2014    Procedure: IRRIGATION AND DEBRIDEMENT PERIRECTAL ABSCESS;  Surgeon: Pedro Earls, MD;  Location: [facility];  Service: General;  Laterality: N/A;  . Rotator cuff repair      bilaterla  . Bulging disk      lower part of back    Social History   Social History  . Marital Status: Married    Spouse Name: Lake Bells  . Number of Children: 3  . Years of Education: 12   Occupational History  . retired from [employer]     11/2012    Social History Main Topics  . Smoking status: Never Smoker   . Smokeless tobacco: Never Used  . Alcohol Use: No  . Drug Use: No  . Sexual Activity:    Partners: Male    Birth Control/ Protection: None   Other Topics Concern  . Not on file   Social History Narrative   Lives with her husband.  Their eldest daughter lives upstairs.    Current Outpatient Prescriptions on File Prior to Visit  Medication Sig Dispense Refill  . albuterol (PROVENTIL HFA;VENTOLIN HFA) 108 (90 BASE) MCG/ACT inhaler Inhale 1 puff into the lungs every 6 (six) hours as needed for shortness of breath.    [name] amLODipine (NORVASC) 10 MG tablet TAKE 1 TABLET BY MOUTH EVERY DAY 90 tablet 1  . aspirin EC 81 MG tablet Take 81 mg by mouth daily.    . benzonatate (TESSALON) 100 MG capsule Take 200 mg by mouth 2 (two) times daily as needed for cough.    . bromocriptine (PARLODEL) 2.5 MG tablet Take 0.5 tablets (1.25 mg total) by mouth at bedtime. 45 tablet 3  . cetirizine (ZYRTEC) 10 MG tablet Take 1 tablet (10 mg total) by  mouth daily. 30 tablet 11  . Cholecalciferol (VITAMIN D3) 1000 UNITS CAPS Take 1,000 Units by mouth daily.     . CRESTOR 20 MG tablet TAKE 1 TABLET BY MOUTH EVERY DAY 90 tablet 3  . Cyanocobalamin (VITAMIN B 12 PO) Take 500 mg by mouth 2 (two) times daily.    . diphenhydramine-acetaminophen (TYLENOL PM) 25-500 MG TABS Take 1 tablet by mouth at bedtime as needed (for sleep).    . fexofenadine (ALLEGRA) 180 MG tablet Take 180 mg by mouth 2 (two) times daily.     [name] FLUoxetine (PROZAC) 40 MG capsule Take 1 capsule (40 mg total) by mouth daily. 90 capsule 3  . fluticasone (VERAMYST) 27.5 MCG/SPRAY nasal spray Place 2 sprays into the nose daily as needed for rhinitis.     . hydrOXYzine (ATARAX/VISTARIL) 25 MG tablet TAKE 1/2 TO 1 TABLET BY MOUTH AT BEDTIME AS NEEDED FOR ITCHING 30 tablet 2  . ibuprofen (ADVIL,MOTRIN) 200 MG tablet Take 800 mg by mouth every 6 (six) hours as needed for moderate pain.     .  metoprolol succinate (TOPROL-XL) 100 MG 24 hr tablet TAKE 1 TABLET BY MOUTH DAILY WITH BREAKFAST. TAKE WITH OR IMMEDIATELY FOLLOWING A MEAL 90 tablet 0  . montelukast (SINGULAIR) 10 MG tablet Take 1 tablet (10 mg total) by mouth at bedtime. 90 tablet 0  . nitroGLYCERIN (NITROSTAT) 0.4 MG SL tablet Place 0.4 mg under the tongue every 5 (five) minutes as needed.    [name] omeprazole (PRILOSEC) 40 MG capsule Take 40 mg by mouth daily with breakfast.    . rosuvastatin (CRESTOR) 20 MG tablet Take 20 mg by mouth daily with breakfast.    . vitamin E 400 UNIT capsule Take 400 Units by mouth daily.     No current facility-administered medications on file prior to visit.    Allergies  Allergen Reactions  . Prednisone Other (See Comments)    REACTION: mood swings, nightmares. "Shot doesn't bother me, reaction is just with the pill" she states she has had the steroid injections before. From our records methylprednisone was given to her in 2013 without any complications.    Family History  Problem Relation Age of Onset  . Heart attack Mother   . Diabetes Mother   . Lung cancer Mother   . Asthma Mother   . Heart disease Mother   . Suicidality Father     "killed himself"  . Allergies Other     all family--seasonal allergies  . Asthma Daughter     x 2  . Cancer Daughter     pre-cancerous polyp  . Diabetes Sister   . Cancer Sister   . Cervical cancer Daughter     cervical     BP 138/76 mmHg  Pulse 100  Temp(Src) 98.3 F (36.8 C) (Oral)  Ht 5' (1.524 m)  Wt 190 lb (86.183 kg)  BMI 37.11 kg/m2  SpO2 95%  Review of Systems She denies hypoglycemia.     Objective:   Physical Exam VITAL SIGNS:  See vs page GENERAL: no distress Pulses: dorsalis pedis intact bilat.   MSK: no deformity of the feet CV: no leg edema Skin:  no ulcer on the feet.  normal color and temp on the feet. Neuro: sensation is intact to touch on the feet Ext: There is bilateral onychomycosis of the  toenails   A1c=7.8%    Assessment & Plan:  DM: she needs increased rx, if it can be done with a regimen  that avoids or minimizes hypoglycemia. Diarrhea, new, possibly due to metformin.    Patient is advised the following: Patient Instructions  i have sent a prescription to your pharmacy, to add "victoza."  The side-effect is nausea, which goes away with time.  To avoid this side-effect, start with the lowest (0.6) setting.  After a few days, increase to 1.2.  If you still have little or no nausea, increase to the highest (1.8) setting, and continue that setting.  Here is a discount card.   Please reduce the metformin to 2 pills per day. Please come back for a follow-up appointment in 2 months.

## 2015-05-02 NOTE — Patient Instructions (Signed)
i have sent a prescription to your pharmacy, to add "victoza."  The side-effect is nausea, which goes away with time.  To avoid this side-effect, start with the lowest (0.6) setting.  After a few days, increase to 1.2.  If you still have little or no nausea, increase to the highest (1.8) setting, and continue that setting.  Here is a discount card.   Please reduce the metformin to 2 pills per day. Please come back for a follow-up appointment in 2 months.

## 2015-05-08 DIAGNOSIS — M85852 Other specified disorders of bone density and structure, left thigh: Secondary | ICD-10-CM | POA: Diagnosis not present

## 2015-05-08 DIAGNOSIS — Z780 Asymptomatic menopausal state: Secondary | ICD-10-CM | POA: Diagnosis not present

## 2015-05-08 LAB — HM DEXA SCAN

## 2015-05-10 ENCOUNTER — Encounter: Payer: Self-pay | Admitting: Family Medicine

## 2015-05-14 ENCOUNTER — Other Ambulatory Visit: Payer: Self-pay | Admitting: Family Medicine

## 2015-05-16 DIAGNOSIS — E119 Type 2 diabetes mellitus without complications: Secondary | ICD-10-CM | POA: Diagnosis not present

## 2015-05-16 DIAGNOSIS — Z86010 Personal history of colonic polyps: Secondary | ICD-10-CM | POA: Diagnosis not present

## 2015-05-16 DIAGNOSIS — K219 Gastro-esophageal reflux disease without esophagitis: Secondary | ICD-10-CM | POA: Diagnosis not present

## 2015-05-16 DIAGNOSIS — R109 Unspecified abdominal pain: Secondary | ICD-10-CM | POA: Diagnosis not present

## 2015-05-16 DIAGNOSIS — R1310 Dysphagia, unspecified: Secondary | ICD-10-CM | POA: Diagnosis not present

## 2015-05-16 DIAGNOSIS — R197 Diarrhea, unspecified: Secondary | ICD-10-CM | POA: Diagnosis not present

## 2015-05-16 NOTE — Telephone Encounter (Signed)
SPOKE WITH PATIENT GOING TO DERMATOLOGIST DOES NOT NEED ATARAX REFILL

## 2015-05-17 DIAGNOSIS — R197 Diarrhea, unspecified: Secondary | ICD-10-CM | POA: Diagnosis not present

## 2015-05-17 DIAGNOSIS — L821 Other seborrheic keratosis: Secondary | ICD-10-CM | POA: Diagnosis not present

## 2015-05-18 ENCOUNTER — Emergency Department (HOSPITAL_BASED_OUTPATIENT_CLINIC_OR_DEPARTMENT_OTHER): Payer: Medicare Other

## 2015-05-18 ENCOUNTER — Emergency Department (HOSPITAL_BASED_OUTPATIENT_CLINIC_OR_DEPARTMENT_OTHER)
Admission: EM | Admit: 2015-05-18 | Discharge: 2015-05-18 | Disposition: A | Discharge status: Home / Self Care | Payer: Medicare Other | Attending: Emergency Medicine | Admitting: Emergency Medicine

## 2015-05-18 ENCOUNTER — Encounter (HOSPITAL_BASED_OUTPATIENT_CLINIC_OR_DEPARTMENT_OTHER): Payer: Self-pay | Admitting: *Deleted

## 2015-05-18 DIAGNOSIS — F419 Anxiety disorder, unspecified: Secondary | ICD-10-CM | POA: Diagnosis not present

## 2015-05-18 DIAGNOSIS — Z8739 Personal history of other diseases of the musculoskeletal system and connective tissue: Secondary | ICD-10-CM | POA: Diagnosis not present

## 2015-05-18 DIAGNOSIS — I10 Essential (primary) hypertension: Secondary | ICD-10-CM | POA: Insufficient documentation

## 2015-05-18 DIAGNOSIS — E119 Type 2 diabetes mellitus without complications: Secondary | ICD-10-CM | POA: Diagnosis not present

## 2015-05-18 DIAGNOSIS — Z79899 Other long term (current) drug therapy: Secondary | ICD-10-CM | POA: Diagnosis not present

## 2015-05-18 DIAGNOSIS — F329 Major depressive disorder, single episode, unspecified: Secondary | ICD-10-CM | POA: Insufficient documentation

## 2015-05-18 DIAGNOSIS — Z862 Personal history of diseases of the blood and blood-forming organs and certain disorders involving the immune mechanism: Secondary | ICD-10-CM | POA: Diagnosis not present

## 2015-05-18 DIAGNOSIS — E669 Obesity, unspecified: Secondary | ICD-10-CM | POA: Diagnosis not present

## 2015-05-18 DIAGNOSIS — E785 Hyperlipidemia, unspecified: Secondary | ICD-10-CM | POA: Insufficient documentation

## 2015-05-18 DIAGNOSIS — I2510 Atherosclerotic heart disease of native coronary artery without angina pectoris: Secondary | ICD-10-CM | POA: Insufficient documentation

## 2015-05-18 DIAGNOSIS — J209 Acute bronchitis, unspecified: Secondary | ICD-10-CM | POA: Diagnosis not present

## 2015-05-18 DIAGNOSIS — Z7982 Long term (current) use of aspirin: Secondary | ICD-10-CM | POA: Insufficient documentation

## 2015-05-18 DIAGNOSIS — R197 Diarrhea, unspecified: Secondary | ICD-10-CM | POA: Diagnosis not present

## 2015-05-18 DIAGNOSIS — R05 Cough: Secondary | ICD-10-CM | POA: Diagnosis not present

## 2015-05-18 LAB — RAPID STREP SCREEN (MED CTR MEBANE ONLY): Streptococcus, Group A Screen (Direct): NEGATIVE

## 2015-05-18 MED ORDER — AZITHROMYCIN 250 MG PO TABS: ORAL_TABLET | ORAL | Status: DC

## 2015-05-18 MED ORDER — GUAIFENESIN-CODEINE 100-10 MG/5ML PO SOLN: 10.0000 mL | Freq: Four times a day (QID) | ORAL | Status: DC | PRN

## 2015-05-18 NOTE — ED Notes (Addendum)
Cough for a week. Worse at night. Not sure of a fever. Abdominal pain and diarrhea for a few days. Sore throat.

## 2015-05-18 NOTE — ED Provider Notes (Signed)
CSN: [id number]     Arrival date & time 05/18/15  1437 History   First MD Initiated Contact with Patient 05/18/15 1624     Chief Complaint  Patient presents with  . Cough     (Consider location/radiation/quality/duration/timing/severity/associated sxs/prior Treatment) HPI Comments: Patient is a 64 year old female with extensive past medical history including coronary artery disease with stent, diabetes, and hypertension. She presents for evaluation of chest congestion and cough that has been worsening over the past week. She reports cough productive of yellow sputum. She denies any chest pain. She denies any difficulty breathing. Her symptoms are worse at night when she is trying to sleep.  Patient is a 64 y.o. female presenting with cough. The history is provided by the patient.  Cough Cough characteristics:  Productive Sputum characteristics:  Yellow Severity:  Moderate Onset quality:  Gradual Duration:  1 week Timing:  Constant Progression:  Worsening Chronicity:  New Smoker: no   Relieved by:  Nothing Worsened by:  Nothing tried Ineffective treatments:  Decongestant and cough suppressants Associated symptoms: no chest pain, no chills and no fever     Past Medical History  Diagnosis Date  . Hypertension   . Hyperlipidemia   . Obesity   . Anxiety     Prior suicide attempt  . Depression   . Insulin resistance   . Iron deficiency anemia   . GERD (gastroesophageal reflux disease)   . Cervical spondylosis   . CAD (coronary artery disease)     a) s/p DES to LAD 07/2005 b) Last Myoview low risk 11/2011 showing small fixed apical perfusion defect (prior MI vs attenuation) but no ischemia - normal EF.  . Diabetes mellitus without complication [facility])    Past Surgical History  Procedure Laterality Date  . Cardiac catheterization  06/17/2007    NORMAL. EF 60%  . Coronary stent placement  07/2005    LEFT ANTERIOR DESCENDING  . Tubal ligation    . Tonsillectomy and adenoidectomy     . Childbirth      X3  . Breast enhancement surgery    . Cervical spondylosis      SINGLE LEVEL FUSION  . Incision and drainage breast abscess  01/05/2012       . Forearm fracture surgery  2010    hand and shoulder   . Incision and drainage perirectal abscess N/A 02/18/2014    Procedure: IRRIGATION AND DEBRIDEMENT PERIRECTAL ABSCESS;  Surgeon: Pedro Earls, MD;  Location: [facility];  Service: General;  Laterality: N/A;  . Rotator cuff repair      bilaterla  . Bulging disk      lower part of back   Family History  Problem Relation Age of Onset  . Heart attack Mother   . Diabetes Mother   . Lung cancer Mother   . Asthma Mother   . Heart disease Mother   . Suicidality Father     "killed himself"  . Allergies Other     all family--seasonal allergies  . Asthma Daughter     x 2  . Cancer Daughter     pre-cancerous polyp  . Diabetes Sister   . Cancer Sister   . Cervical cancer Daughter     cervical    Social History  Substance Use Topics  . Smoking status: Never Smoker   . Smokeless tobacco: Never Used  . Alcohol Use: No   OB History    No data available     Review of Systems  Constitutional: Negative for fever and chills.  Respiratory: Positive for cough.   Cardiovascular: Negative for chest pain.  All other systems reviewed and are negative.     Allergies  Prednisone  Home Medications   Prior to Admission medications   Medication Sig Start Date End Date Taking? Authorizing Provider  albuterol (PROVENTIL HFA;VENTOLIN HFA) 108 (90 BASE) MCG/ACT inhaler Inhale 1 puff into the lungs every 6 (six) hours as needed for shortness of breath.    Historical Provider, MD  amLODipine (NORVASC) 10 MG tablet TAKE 1 TABLET BY MOUTH EVERY DAY 09/21/14   Larey Dresser, MD  aspirin EC 81 MG tablet Take 81 mg by mouth daily.    Historical Provider, MD  benzonatate (TESSALON) 100 MG capsule Take 200 mg by mouth 2 (two) times daily as needed for cough.    Historical Provider, MD   bromocriptine (PARLODEL) 2.5 MG tablet Take 0.5 tablets (1.25 mg total) by mouth at bedtime. 11/14/14   Renato Shin, MD  cetirizine (ZYRTEC) 10 MG tablet Take 1 tablet (10 mg total) by mouth daily. 11/13/14   Tishira R Brewington, PA-C  Cholecalciferol (VITAMIN D3) 1000 UNITS CAPS Take 1,000 Units by mouth daily.     Historical Provider, MD  CRESTOR 20 MG tablet TAKE 1 TABLET BY MOUTH EVERY DAY 11/16/14   Burtis Junes, NP  Cyanocobalamin (VITAMIN B 12 PO) Take 500 mg by mouth 2 (two) times daily.    Historical Provider, MD  diphenhydramine-acetaminophen (TYLENOL PM) 25-500 MG TABS Take 1 tablet by mouth at bedtime as needed (for sleep).    Historical Provider, MD  fexofenadine (ALLEGRA) 180 MG tablet Take 180 mg by mouth 2 (two) times daily.     Historical Provider, MD  FLUoxetine (PROZAC) 40 MG capsule Take 1 capsule (40 mg total) by mouth daily. 09/10/13   Burtis Junes, NP  fluticasone (VERAMYST) 27.5 MCG/SPRAY nasal spray Place 2 sprays into the nose daily as needed for rhinitis.     Historical Provider, MD  hydrOXYzine (ATARAX/VISTARIL) 25 MG tablet Take 0.5-1 tablets (12.5-25 mg total) by mouth at bedtime as needed for itching. PATIENT NEEDS OFFICE VISIT FOR ADDITIONAL REFILLS 05/16/15   Jaynee Eagles, PA-C  ibuprofen (ADVIL,MOTRIN) 200 MG tablet Take 800 mg by mouth every 6 (six) hours as needed for moderate pain.     Historical Provider, MD  Liraglutide (VICTOZA) 18 MG/3ML SOPN Inject 0.3 mLs (1.8 mg total) into the skin daily. And pen needles 1/day 05/02/15   Renato Shin, MD  metFORMIN (GLUCOPHAGE-XR) 500 MG 24 hr tablet Take 2 tablets (1,000 mg total) by mouth daily with breakfast. 05/02/15   Renato Shin, MD  metoprolol succinate (TOPROL-XL) 100 MG 24 hr tablet TAKE 1 TABLET BY MOUTH DAILY WITH BREAKFAST. TAKE WITH OR IMMEDIATELY FOLLOWING A MEAL 02/20/15   Larey Dresser, MD  montelukast (SINGULAIR) 10 MG tablet Take 1 tablet (10 mg total) by mouth at bedtime. 01/05/15   Mancel Bale, PA-C   nitroGLYCERIN (NITROSTAT) 0.4 MG SL tablet Place 0.4 mg under the tongue every 5 (five) minutes as needed. 12/11/11   Dayna N Dunn, PA-C  omeprazole (PRILOSEC) 40 MG capsule Take 40 mg by mouth daily with breakfast.    Historical Provider, MD  rosuvastatin (CRESTOR) 20 MG tablet Take 20 mg by mouth daily with breakfast.    Historical Provider, MD  vitamin E 400 UNIT capsule Take 400 Units by mouth daily.    Historical Provider, MD   BP 132/67  mmHg  Pulse 93  Temp(Src) 98.7 F (37.1 C) (Oral)  Resp 20  Ht 5' (1.524 m)  Wt 180 lb (81.647 kg)  BMI 35.15 kg/m2  SpO2 93% Physical Exam  Constitutional: She is oriented to person, place, and time. She appears well-developed and well-nourished. No distress.  HENT:  Head: Normocephalic and atraumatic.  Neck: Normal range of motion. Neck supple.  Cardiovascular: Normal rate and regular rhythm.  Exam reveals no gallop and no friction rub.   No murmur heard. Pulmonary/Chest: Effort normal and breath sounds normal. No respiratory distress. She has no wheezes.  Abdominal: Soft. Bowel sounds are normal. She exhibits no distension. There is no tenderness.  Musculoskeletal: Normal range of motion.  Neurological: She is alert and oriented to person, place, and time.  Skin: Skin is warm and dry. She is not diaphoretic.  Nursing note and vitals reviewed.   ED Course  Procedures (including critical care time) Labs Review Labs Reviewed  RAPID STREP SCREEN (NOT AT [facility])  CULTURE, GROUP A STREP    Imaging Review Dg Chest 2 View  05/18/2015  CLINICAL DATA:  Cough. EXAM: CHEST  2 VIEW COMPARISON:  January 10, 2014. FINDINGS: The heart size and mediastinal contours are within normal limits. Both lungs are clear. No pneumothorax or pleural effusion is noted. The visualized skeletal structures are unremarkable. IMPRESSION: No active cardiopulmonary disease. Electronically Signed   By: Marijo Conception, M.D.   On: 05/18/2015 15:11   I have personally reviewed  and evaluated these images and lab results as part of my medical decision-making.   EKG Interpretation None      MDM   Final diagnoses:  None    We'll treat with Zithromax and Robitussin with codeine for acute bronchitis. She is to return as needed for any problems.    Veryl Speak, MD 05/18/15 [phone]

## 2015-05-18 NOTE — Discharge Instructions (Signed)
Zithromax as prescribed.  Robitussin with codeine as needed for cough.  Return to the emergency department if you develop chest pain, difficulty breathing, or other new and concerning symptoms.   Acute Bronchitis Bronchitis is inflammation of the airways that extend from the windpipe into the lungs (bronchi). The inflammation often causes mucus to develop. This leads to a cough, which is the most common symptom of bronchitis.  In acute bronchitis, the condition usually develops suddenly and goes away over time, usually in a couple weeks. Smoking, allergies, and asthma can make bronchitis worse. Repeated episodes of bronchitis may cause further lung problems.  CAUSES Acute bronchitis is most often caused by the same virus that causes a cold. The virus can spread from person to person (contagious) through coughing, sneezing, and touching contaminated objects. SIGNS AND SYMPTOMS   Cough.   Fever.   Coughing up mucus.   Body aches.   Chest congestion.   Chills.   Shortness of breath.   Sore throat.  DIAGNOSIS  Acute bronchitis is usually diagnosed through a physical exam. Your health care provider will also ask you questions about your medical history. Tests, such as chest X-rays, are sometimes done to rule out other conditions.  TREATMENT  Acute bronchitis usually goes away in a couple weeks. Oftentimes, no medical treatment is necessary. Medicines are sometimes given for relief of fever or cough. Antibiotic medicines are usually not needed but may be prescribed in certain situations. In some cases, an inhaler may be recommended to help reduce shortness of breath and control the cough. A cool mist vaporizer may also be used to help thin bronchial secretions and make it easier to clear the chest.  HOME CARE INSTRUCTIONS  Get plenty of rest.   Drink enough fluids to keep your urine clear or pale yellow (unless you have a medical condition that requires fluid restriction).  Increasing fluids may help thin your respiratory secretions (sputum) and reduce chest congestion, and it will prevent dehydration.   Take medicines only as directed by your health care provider.  If you were prescribed an antibiotic medicine, finish it all even if you start to feel better.  Avoid smoking and secondhand smoke. Exposure to cigarette smoke or irritating chemicals will make bronchitis worse. If you are a smoker, consider using nicotine gum or skin patches to help control withdrawal symptoms. Quitting smoking will help your lungs heal faster.   Reduce the chances of another bout of acute bronchitis by washing your hands frequently, avoiding people with cold symptoms, and trying not to touch your hands to your mouth, nose, or eyes.   Keep all follow-up visits as directed by your health care provider.  SEEK MEDICAL CARE IF: Your symptoms do not improve after 1 week of treatment.  SEEK IMMEDIATE MEDICAL CARE IF:  You develop an increased fever or chills.   You have chest pain.   You have severe shortness of breath.  You have bloody sputum.   You develop dehydration.  You faint or repeatedly feel like you are going to pass out.  You develop repeated vomiting.  You develop a severe headache. MAKE SURE YOU:   Understand these instructions.  Will watch your condition.  Will get help right away if you are not doing well or get worse.   This information is not intended to replace advice given to you by your health care provider. Make sure you discuss any questions you have with your health care provider.   Document Released: 08/08/2004  Document Revised: 07/22/2014 Document Reviewed: 12/22/2012 Elsevier Interactive Patient Education [employer].

## 2015-05-20 LAB — CULTURE, GROUP A STREP: Strep A Culture: NEGATIVE

## 2015-05-21 ENCOUNTER — Emergency Department (HOSPITAL_BASED_OUTPATIENT_CLINIC_OR_DEPARTMENT_OTHER): Payer: Medicare Other

## 2015-05-21 ENCOUNTER — Encounter (HOSPITAL_BASED_OUTPATIENT_CLINIC_OR_DEPARTMENT_OTHER): Payer: Self-pay | Admitting: Emergency Medicine

## 2015-05-21 ENCOUNTER — Inpatient Hospital Stay (HOSPITAL_BASED_OUTPATIENT_CLINIC_OR_DEPARTMENT_OTHER)
Admission: EM | Admit: 2015-05-21 | Discharge: 2015-05-23 | DRG: 195 | Disposition: A | Discharge status: Home / Self Care | Payer: Medicare Other | Attending: Family Medicine | Admitting: Family Medicine

## 2015-05-21 DIAGNOSIS — G4733 Obstructive sleep apnea (adult) (pediatric): Secondary | ICD-10-CM | POA: Diagnosis not present

## 2015-05-21 DIAGNOSIS — F329 Major depressive disorder, single episode, unspecified: Secondary | ICD-10-CM | POA: Diagnosis present

## 2015-05-21 DIAGNOSIS — Z7984 Long term (current) use of oral hypoglycemic drugs: Secondary | ICD-10-CM

## 2015-05-21 DIAGNOSIS — Z7982 Long term (current) use of aspirin: Secondary | ICD-10-CM | POA: Diagnosis not present

## 2015-05-21 DIAGNOSIS — I2510 Atherosclerotic heart disease of native coronary artery without angina pectoris: Secondary | ICD-10-CM | POA: Diagnosis not present

## 2015-05-21 DIAGNOSIS — I10 Essential (primary) hypertension: Secondary | ICD-10-CM | POA: Diagnosis present

## 2015-05-21 DIAGNOSIS — I119 Hypertensive heart disease without heart failure: Secondary | ICD-10-CM | POA: Insufficient documentation

## 2015-05-21 DIAGNOSIS — E119 Type 2 diabetes mellitus without complications: Secondary | ICD-10-CM | POA: Insufficient documentation

## 2015-05-21 DIAGNOSIS — E785 Hyperlipidemia, unspecified: Secondary | ICD-10-CM | POA: Diagnosis not present

## 2015-05-21 DIAGNOSIS — J189 Pneumonia, unspecified organism: Secondary | ICD-10-CM | POA: Diagnosis not present

## 2015-05-21 DIAGNOSIS — F419 Anxiety disorder, unspecified: Secondary | ICD-10-CM | POA: Diagnosis not present

## 2015-05-21 DIAGNOSIS — R05 Cough: Secondary | ICD-10-CM

## 2015-05-21 DIAGNOSIS — Z888 Allergy status to other drugs, medicaments and biological substances status: Secondary | ICD-10-CM | POA: Diagnosis not present

## 2015-05-21 DIAGNOSIS — J13 Pneumonia due to Streptococcus pneumoniae: Secondary | ICD-10-CM | POA: Diagnosis not present

## 2015-05-21 DIAGNOSIS — Z23 Encounter for immunization: Secondary | ICD-10-CM | POA: Diagnosis not present

## 2015-05-21 DIAGNOSIS — Z6835 Body mass index (BMI) 35.0-35.9, adult: Secondary | ICD-10-CM | POA: Diagnosis not present

## 2015-05-21 DIAGNOSIS — K219 Gastro-esophageal reflux disease without esophagitis: Secondary | ICD-10-CM | POA: Diagnosis present

## 2015-05-21 DIAGNOSIS — Z955 Presence of coronary angioplasty implant and graft: Secondary | ICD-10-CM

## 2015-05-21 LAB — CBC WITH DIFFERENTIAL/PLATELET
Basophils Absolute: 0 10*3/uL (ref 0.0–0.1)
Basophils Relative: 0 %
Eosinophils Absolute: 0.7 10*3/uL (ref 0.0–0.7)
Eosinophils Relative: 7 %
HCT: 38.6 % (ref 36.0–46.0)
Hemoglobin: 12.1 g/dL (ref 12.0–15.0)
Lymphocytes Relative: 16 %
Lymphs Abs: 1.8 10*3/uL (ref 0.7–4.0)
MCH: 25.2 pg — ABNORMAL LOW (ref 26.0–34.0)
MCHC: 31.3 g/dL (ref 30.0–36.0)
MCV: 80.2 fL (ref 78.0–100.0)
Monocytes Absolute: 0.9 10*3/uL (ref 0.1–1.0)
Monocytes Relative: 8 %
Neutro Abs: 7.5 10*3/uL (ref 1.7–7.7)
Neutrophils Relative %: 69 %
Platelets: 286 10*3/uL (ref 150–400)
RBC: 4.81 MIL/uL (ref 3.87–5.11)
RDW: 14.8 % (ref 11.5–15.5)
WBC: 11 10*3/uL — ABNORMAL HIGH (ref 4.0–10.5)

## 2015-05-21 LAB — BASIC METABOLIC PANEL
Anion gap: 10 (ref 5–15)
BUN: 12 mg/dL (ref 6–20)
CO2: 30 mmol/L (ref 22–32)
Calcium: 9.4 mg/dL (ref 8.9–10.3)
Chloride: 96 mmol/L — ABNORMAL LOW (ref 101–111)
Creatinine, Ser: 0.59 mg/dL (ref 0.44–1.00)
GFR calc Af Amer: >60 mL/min (ref >60)
GFR calc non Af Amer: >60 mL/min (ref >60)
Glucose, Bld: 139 mg/dL — ABNORMAL HIGH (ref 65–99)
Potassium: 3.8 mmol/L (ref 3.5–5.1)
Sodium: 136 mmol/L (ref 135–145)

## 2015-05-21 LAB — EXPECTORATED SPUTUM ASSESSMENT W REFEX TO RESP CULTURE

## 2015-05-21 LAB — EXPECTORATED SPUTUM ASSESSMENT W GRAM STAIN, RFLX TO RESP C

## 2015-05-21 LAB — GLUCOSE, CAPILLARY: Glucose-Capillary: 198 mg/dL — ABNORMAL HIGH (ref 65–99)

## 2015-05-21 LAB — TROPONIN I: Troponin I: <0.03 ng/mL (ref <0.031)

## 2015-05-21 MED ORDER — ACETAMINOPHEN 325 MG PO TABS
650.0000 mg | ORAL_TABLET | Freq: Four times a day (QID) | ORAL | Status: DC | PRN
Start: 2015-05-21 — End: 2015-05-23
  Administered 2015-05-22 (×2): 650 mg via ORAL
  Filled 2015-05-21 (×2): qty 2

## 2015-05-21 MED ORDER — PANTOPRAZOLE SODIUM 40 MG PO TBEC: 80.0000 mg | DELAYED_RELEASE_TABLET | Freq: Every day | ORAL | Status: DC

## 2015-05-21 MED ORDER — PNEUMOCOCCAL VAC POLYVALENT 25 MCG/0.5ML IJ INJ
0.5000 mL | INJECTION | INTRAMUSCULAR | Status: AC
  Administered 2015-05-22: 0.5 mL via INTRAMUSCULAR
  Filled 2015-05-21: qty 0.5

## 2015-05-21 MED ORDER — DIPHENHYDRAMINE HCL 25 MG PO CAPS
50.0000 mg | ORAL_CAPSULE | Freq: Once | ORAL | Status: AC
  Administered 2015-05-21: 50 mg via ORAL
  Filled 2015-05-21: qty 2

## 2015-05-21 MED ORDER — INSULIN ASPART 100 UNIT/ML ~~LOC~~ SOLN
0.0000 [IU] | Freq: Three times a day (TID) | SUBCUTANEOUS | Status: DC
  Administered 2015-05-22 (×3): 2 [IU] via SUBCUTANEOUS
  Administered 2015-05-23 (×2): 3 [IU] via SUBCUTANEOUS

## 2015-05-21 MED ORDER — SENNOSIDES-DOCUSATE SODIUM 8.6-50 MG PO TABS: 1.0000 | ORAL_TABLET | Freq: Every evening | ORAL | Status: DC | PRN

## 2015-05-21 MED ORDER — SODIUM CHLORIDE 0.9 % IV SOLN
250.0000 mL | INTRAVENOUS | Status: DC | PRN
Start: 2015-05-21 — End: 2015-05-23

## 2015-05-21 MED ORDER — SODIUM CHLORIDE 0.9 % IJ SOLN
3.0000 mL | Freq: Two times a day (BID) | INTRAMUSCULAR | Status: DC
  Administered 2015-05-21 – 2015-05-22 (×3): 3 mL via INTRAVENOUS

## 2015-05-21 MED ORDER — ALBUTEROL SULFATE (2.5 MG/3ML) 0.083% IN NEBU: 2.5000 mg | INHALATION_SOLUTION | Freq: Four times a day (QID) | RESPIRATORY_TRACT | Status: DC | PRN

## 2015-05-21 MED ORDER — IPRATROPIUM-ALBUTEROL 0.5-2.5 (3) MG/3ML IN SOLN
3.0000 mL | RESPIRATORY_TRACT | Status: DC
Start: 2015-05-21 — End: 2015-05-21
  Administered 2015-05-21 (×2): 3 mL via RESPIRATORY_TRACT
  Filled 2015-05-21 (×2): qty 3

## 2015-05-21 MED ORDER — ROSUVASTATIN CALCIUM 20 MG PO TABS
20.0000 mg | ORAL_TABLET | Freq: Every day | ORAL | Status: DC
  Administered 2015-05-22 – 2015-05-23 (×2): 20 mg via ORAL
  Filled 2015-05-21 (×2): qty 1

## 2015-05-21 MED ORDER — LORATADINE 10 MG PO TABS: 10.0000 mg | ORAL_TABLET | Freq: Every day | ORAL | Status: DC

## 2015-05-21 MED ORDER — HEPARIN SODIUM (PORCINE) 5000 UNIT/ML IJ SOLN: 5000.0000 [IU] | Freq: Three times a day (TID) | INTRAMUSCULAR | Status: DC

## 2015-05-21 MED ORDER — ASPIRIN EC 81 MG PO TBEC
81.0000 mg | DELAYED_RELEASE_TABLET | Freq: Every day | ORAL | Status: DC
  Administered 2015-05-22 – 2015-05-23 (×2): 81 mg via ORAL
  Filled 2015-05-21 (×2): qty 1

## 2015-05-21 MED ORDER — INFLUENZA VAC SPLIT QUAD 0.5 ML IM SUSY
0.5000 mL | PREFILLED_SYRINGE | INTRAMUSCULAR | Status: AC
  Administered 2015-05-22: 0.5 mL via INTRAMUSCULAR
  Filled 2015-05-21: qty 0.5

## 2015-05-21 MED ORDER — ACETAMINOPHEN 650 MG RE SUPP
650.0000 mg | Freq: Four times a day (QID) | RECTAL | Status: DC | PRN
Start: 2015-05-21 — End: 2015-05-23

## 2015-05-21 MED ORDER — AMLODIPINE BESYLATE 10 MG PO TABS: 10.0000 mg | ORAL_TABLET | Freq: Every day | ORAL | Status: DC

## 2015-05-21 MED ORDER — SODIUM CHLORIDE 0.9 % IJ SOLN: 3.0000 mL | INTRAMUSCULAR | Status: DC | PRN

## 2015-05-21 MED ORDER — LEVOFLOXACIN 500 MG PO TABS
500.0000 mg | ORAL_TABLET | Freq: Every day | ORAL | Status: DC
  Administered 2015-05-21: 500 mg via ORAL
  Filled 2015-05-21: qty 1

## 2015-05-21 MED ORDER — NITROGLYCERIN 0.4 MG SL SUBL: 0.4000 mg | SUBLINGUAL_TABLET | SUBLINGUAL | Status: DC | PRN

## 2015-05-21 MED ORDER — LEVOFLOXACIN 500 MG PO TABS: 500.0000 mg | ORAL_TABLET | Freq: Every day | ORAL | Status: DC

## 2015-05-21 MED ORDER — BENZONATATE 100 MG PO CAPS
200.0000 mg | ORAL_CAPSULE | Freq: Two times a day (BID) | ORAL | Status: DC | PRN
  Administered 2015-05-21 – 2015-05-23 (×4): 200 mg via ORAL
  Filled 2015-05-21 (×4): qty 2

## 2015-05-21 MED ORDER — IPRATROPIUM-ALBUTEROL 0.5-2.5 (3) MG/3ML IN SOLN: 3.0000 mL | RESPIRATORY_TRACT | Status: DC | PRN

## 2015-05-21 MED ORDER — FLUOXETINE HCL 20 MG PO CAPS
40.0000 mg | ORAL_CAPSULE | Freq: Every day | ORAL | Status: DC
  Administered 2015-05-22 – 2015-05-23 (×2): 40 mg via ORAL
  Filled 2015-05-21 (×2): qty 2

## 2015-05-21 MED ORDER — HEPARIN SODIUM (PORCINE) 5000 UNIT/ML IJ SOLN
5000.0000 [IU] | Freq: Three times a day (TID) | INTRAMUSCULAR | Status: DC
  Administered 2015-05-21 – 2015-05-23 (×6): 5000 [IU] via SUBCUTANEOUS
  Filled 2015-05-21 (×5): qty 1

## 2015-05-21 MED ORDER — METOPROLOL SUCCINATE ER 100 MG PO TB24
100.0000 mg | ORAL_TABLET | Freq: Every day | ORAL | Status: DC
  Administered 2015-05-22 – 2015-05-23 (×2): 100 mg via ORAL
  Filled 2015-05-21 (×2): qty 1

## 2015-05-21 NOTE — ED Notes (Signed)
Pt placed on 2L 02 [place] for sats 88%.

## 2015-05-21 NOTE — Progress Notes (Signed)
Ambulated patient to the bathroom.  She refused the bed alarm.  She also states she is fine getting up by herself and does not need any help right now.  She will call if she does.  She is not feeling dizzy at this time.  Will continue to monitor the patient.  [employer] RN-BC, WTA.

## 2015-05-21 NOTE — Progress Notes (Signed)
Patient admitted via [place]. Husband at bedside. Complaining of cough. Will continue to monitor. Ruben Gottron, RN 05/21/2015 10:38 PM

## 2015-05-21 NOTE — H&P (Signed)
[facility] Admission History and Physical Service Pager: [phone]  Patient name: Destiny Ballard Medical record number: [id number] Date of birth: [birth year] Age: 64 y.o. Gender: female  Primary Care Provider: Lamar Blinks, MD Consultants: none Code Status: Full  Chief Complaint: Productive cough, worsening  Assessment and Plan: Destiny Ballard is a 64 y.o. female presenting with a worsening productive cough and shortness of breath. PMH is significant for type 2 diabetes, hypertension, hyperlipidemia, coronary artery disease, GERD, and OSA.  # Community-acquired pneumonia: Worsening productive cough and shortness of breath which has progressively worsened over the course of the week. Denies fevers. Endorses chills. Chest pain only with coughing. Left mid lobe consolidation noted on chest x-ray. Has taken 3 days of Z-Pak which had been prescribed on Friday. Group A strep culture negative from 11/3 ED visit. - Admit to the floor for observation; attending physician is Dr. Madison Hickman. - O2 supplementation as needed for oxygen saturations greater than 92%  - Wean O2 as necessary - Initiate Levaquin 500 mg by mouth - Tessalon Perles for coughing as needed - Obtain sputum cultures/Gram stain; obtain blood cultures - May need PFTs as outpatient. (Due to long-term secondhand smoke exposure) - DuoNeb's every 4 hours when necessary  # Coronary artery disease: PCI in the LAD in 2007; last Myoview on 05/25/14 "looks ok" according to documentation. - Continue Aspirin 81 mg - Continue Metoprolol XL 100 mg  # Type 2 diabetes: Recent A1c 7.9 (10/18); Holding home medication regimen - SSI moderate  # Hypertension: 132/76 on admission - Meds as above  # Hyperlipidemia - Continue Crestor 20 mg  # Anxiety/depression - Continue Prozac 40 mg  FEN/GI: KVO IV fluids; heart healthy car modified diet Prophylaxis: Subcutaneous heparin  Disposition: Home when  medically stable.  History of Present Illness:  Destiny Ballard is a 64 y.o. female presenting to [facility] [place], with worsening productive cough and shortness of breath. Patient states that she was seen on Friday for the same issue. At that time she had a cough a productive cough which had lasted approximately 1 week. At that time she was prescribed a Z-Pak and cough medicine. She states she has had good compliance with this medication but she continues to get worse, and over the past 24 hours for shortness of breath has definitely worsened. She denies any fevers but states that she has had chills on and off for the past week. She endorses some nausea without vomiting. Patient endorses chest pain only with coughing. She denies any palpitations, diaphoresis, headaches, body aches, abdominal pain, leg/calf pain, or dysuria. She states that her daughter has also been experiencing similar symptoms which started about the same time as her symptoms. She denies any recent travel.  In the ED patient was afebrile. Shortness of breath was noted and also oximetry showed an O2 saturation of 88%. Placed on 2 L nasal cannula with adequate response. Minimal improvement with DuoNeb nebulizer. WBC 11.0.  After my discussion with patient her daughter pulled me aside outside the room. She informed me that although patient denied any smoking history the patient's husband smokes over a pack a day in the house. Patient has been living with her husband for over 58 years. Her daughter also states that there are many animals in the house in the house consistently smells of urine and is not a very clean environment. Her daughter also states that the patient seems to come down with some type of new  productive cough about every month.  Review Of Systems: Per HPI Otherwise the remainder of the systems were negative.  Patient Active Problem List   Diagnosis Date Noted  . Community acquired pneumonia 05/21/2015  . CAP  (community acquired pneumonia) 05/21/2015  . Type 2 diabetes mellitus without complication, without long-term current use of insulin ([place])   . Diabetes ([place]) 08/26/2014  . OSA (obstructive sleep apnea) 05/06/2014  . Anorectal abscess 02/17/2014  . Allergic rhinitis 10/14/2013  . Other and unspecified noninfectious gastroenteritis and colitis(558.9) 10/06/2012  . Dyspnea on exertion 09/02/2012  . Chest discomfort 09/02/2012  . Abscess of axilla 01/05/2012  . Cellulitis of axilla, left 01/05/2012  . Chest pain 12/10/2011  . HYPERLIPIDEMIA 05/11/2010  . MORBID OBESITY 05/11/2010  . HYPERTENSION 05/11/2010  . HEADACHE, CHRONIC 05/11/2010  . COUGH 05/11/2010  . ANEMIA-IRON DEFICIENCY 06/28/2008  . CORONARY ARTERY DISEASE 06/28/2008  . GERD 06/28/2008    Past Medical History: Past Medical History  Diagnosis Date  . Hypertension   . Hyperlipidemia   . Obesity   . Anxiety     Prior suicide attempt  . Depression   . Insulin resistance   . Iron deficiency anemia   . GERD (gastroesophageal reflux disease)   . Cervical spondylosis   . CAD (coronary artery disease)     a) s/p DES to LAD 07/2005 b) Last Myoview low risk 11/2011 showing small fixed apical perfusion defect (prior MI vs attenuation) but no ischemia - normal EF.  . Diabetes mellitus without complication [facility])     Past Surgical History: Past Surgical History  Procedure Laterality Date  . Cardiac catheterization  06/17/2007    NORMAL. EF 60%  . Coronary stent placement  07/2005    LEFT ANTERIOR DESCENDING  . Tubal ligation    . Tonsillectomy and adenoidectomy    . Childbirth      X3  . Breast enhancement surgery    . Cervical spondylosis      SINGLE LEVEL FUSION  . Incision and drainage breast abscess  01/05/2012       . Forearm fracture surgery  2010    hand and shoulder   . Incision and drainage perirectal abscess N/A 02/18/2014    Procedure: IRRIGATION AND DEBRIDEMENT PERIRECTAL ABSCESS;  Surgeon: Pedro Earls, MD;  Location: [facility];  Service: General;  Laterality: N/A;  . Rotator cuff repair      bilaterla  . Bulging disk      lower part of back    Social History: Social History  Substance Use Topics  . Smoking status: Never Smoker   . Smokeless tobacco: Never Used  . Alcohol Use: No   Additional social history: Has lived with husband for 83 years; husband was described by daughter as "a chain smoker".  Please also refer to relevant sections of EMR.  Family History: Family History  Problem Relation Age of Onset  . Heart attack Mother   . Diabetes Mother   . Lung cancer Mother   . Asthma Mother   . Heart disease Mother   . Suicidality Father     "killed himself"  . Allergies Other     all family--seasonal allergies  . Asthma Daughter     x 2  . Cancer Daughter     pre-cancerous polyp  . Diabetes Sister   . Cancer Sister   . Cervical cancer Daughter     cervical   Patient's Brother died of acute MI at age 50 (  according to patient's daughter)  Allergies and Medications: Allergies  Allergen Reactions  . Prednisone Other (See Comments)    REACTION: mood swings, nightmares. "Shot doesn't bother me, reaction is just with the pill" she states she has had the steroid injections before. From our records methylprednisone was given to her in 2013 without any complications.   No current facility-administered medications on file prior to encounter.   Current Outpatient Prescriptions on File Prior to Encounter  Medication Sig Dispense Refill  . albuterol (PROVENTIL HFA;VENTOLIN HFA) 108 (90 BASE) MCG/ACT inhaler Inhale 1 puff into the lungs every 6 (six) hours as needed for shortness of breath.    [name] aspirin EC 81 MG tablet Take 81 mg by mouth daily.    . benzonatate (TESSALON) 100 MG capsule Take 200 mg by mouth 2 (two) times daily as needed for cough.    . bromocriptine (PARLODEL) 2.5 MG tablet Take 0.5 tablets (1.25 mg total) by mouth at bedtime. 45 tablet 3  .  Cholecalciferol (VITAMIN D3) 1000 UNITS CAPS Take 1,000 Units by mouth daily.     . CRESTOR 20 MG tablet TAKE 1 TABLET BY MOUTH EVERY DAY 90 tablet 3  . Cyanocobalamin (VITAMIN B 12 PO) Take 500 mg by mouth 2 (two) times daily.    . diphenhydramine-acetaminophen (TYLENOL PM) 25-500 MG TABS Take 1 tablet by mouth at bedtime as needed (for sleep).    . fexofenadine (ALLEGRA) 180 MG tablet Take 180 mg by mouth 2 (two) times daily.     [name] FLUoxetine (PROZAC) 40 MG capsule Take 1 capsule (40 mg total) by mouth daily. 90 capsule 3  . fluticasone (VERAMYST) 27.5 MCG/SPRAY nasal spray Place 2 sprays into the nose daily as needed for rhinitis.     . hydrOXYzine (ATARAX/VISTARIL) 25 MG tablet Take 0.5-1 tablets (12.5-25 mg total) by mouth at bedtime as needed for itching. PATIENT NEEDS OFFICE VISIT FOR ADDITIONAL REFILLS 30 tablet 0  . ibuprofen (ADVIL,MOTRIN) 200 MG tablet Take 800 mg by mouth every 6 (six) hours as needed for moderate pain.     . Liraglutide (VICTOZA) 18 MG/3ML SOPN Inject 0.3 mLs (1.8 mg total) into the skin daily. And pen needles 1/day 6 mL 11  . metFORMIN (GLUCOPHAGE-XR) 500 MG 24 hr tablet Take 2 tablets (1,000 mg total) by mouth daily with breakfast. (Patient taking differently: Take 500 mg by mouth 2 (two) times daily. ) 180 tablet 3  . metoprolol succinate (TOPROL-XL) 100 MG 24 hr tablet TAKE 1 TABLET BY MOUTH DAILY WITH BREAKFAST. TAKE WITH OR IMMEDIATELY FOLLOWING A MEAL 90 tablet 0  . montelukast (SINGULAIR) 10 MG tablet Take 1 tablet (10 mg total) by mouth at bedtime. 90 tablet 0  . nitroGLYCERIN (NITROSTAT) 0.4 MG SL tablet Place 0.4 mg under the tongue every 5 (five) minutes as needed.    . rosuvastatin (CRESTOR) 20 MG tablet Take 20 mg by mouth daily with breakfast.    . vitamin E 400 UNIT capsule Take 400 Units by mouth daily.    [name] amLODipine (NORVASC) 10 MG tablet TAKE 1 TABLET BY MOUTH EVERY DAY 90 tablet 1  . azithromycin (ZITHROMAX Z-PAK) 250 MG tablet 2 po day one, then 1  daily x 4 days 6 tablet 0  . cetirizine (ZYRTEC) 10 MG tablet Take 1 tablet (10 mg total) by mouth daily. (Patient not taking: Reported on 05/21/2015) 30 tablet 11  . guaiFENesin-codeine 100-10 MG/5ML syrup Take 10 mLs by mouth every 6 (six) hours as needed  for cough. 120 mL 0  . omeprazole (PRILOSEC) 40 MG capsule Take 40 mg by mouth daily with breakfast.      Objective: BP 132/76 mmHg  Pulse 100  Temp(Src) 98.5 F (36.9 C) (Oral)  Resp 18  Ht 5' (1.524 m)  Wt 180 lb (81.647 kg)  BMI 35.15 kg/m2  SpO2 93% Exam: General -- oriented x3, pleasant and cooperative. Speech with congested tone. HEENT -- Head is normocephalic. PERRLA. EOMI. Edematous nasal mucosa and OP erythematous without exiting  Neck -- supple; no bruits. No LAD Integument -- intact. No rash, erythema, or ecchymoses.  Chest -- diminished sounds on the left side, no wheezes, no crackles. Cardiac -- RRR. No murmurs noted.  Abdomen -- soft, nontender. No masses palpable. Bowel sounds present. CNS -- cranial nerves II through XII grossly intact. Extremeties - no tenderness or effusions noted. ROM good. 5/5 bilateral strength. Dorsalis pedis pulses present and symmetrical.   Labs and Imaging: CBC BMET   Recent Labs Lab 05/21/15 1452  WBC 11.0*  HGB 12.1  HCT 38.6  PLT 286    Recent Labs Lab 05/21/15 1452  NA 136  K 3.8  CL 96*  CO2 30  BUN 12  CREATININE 0.59  GLUCOSE 139*  CALCIUM 9.4      Elberta Leatherwood, MD 05/21/2015, 9:22 PM PGY-2, [place] Intern pager: [phone], text pages welcome

## 2015-05-21 NOTE — ED Notes (Addendum)
Patient c/o continued cough, worse at night/ Patient seen here Friday for the same complaint. Patient was given Zpack (1 bottle) & Guaiatussin (175ml-- bottle is empty). Patient states she is also taking tylenol & ibuprofen at home. Patient with strong cough, mostly non productive. SPO2 87% on RA while coughing, SPO2 increased to 90% when coughing stopped

## 2015-05-21 NOTE — ED Provider Notes (Signed)
CSN: [id number]     Arrival date & time 05/21/15  1356 History   First MD Initiated Contact with Patient 05/21/15 1410     Chief Complaint  Patient presents with  . Cough     (Consider location/radiation/quality/duration/timing/severity/associated sxs/prior Treatment) Patient is a 64 y.o. female presenting with cough.  Cough Cough characteristics:  Non-productive Severity:  Moderate Onset quality:  Gradual Duration:  1 week Timing:  Constant Progression:  Worsening Chronicity:  New Smoker: no   Context: upper respiratory infection   Context comment:  Seen here 2 days ago, placed on azithromycin Relieved by:  Nothing Worsened by:  Nothing tried Ineffective treatments:  None tried Associated symptoms: shortness of breath   Associated symptoms: no chest pain, no chills, no ear pain, no fever, no rash, no rhinorrhea and no sinus congestion     Past Medical History  Diagnosis Date  . Hypertension   . Hyperlipidemia   . Obesity   . Anxiety     Prior suicide attempt  . Depression   . Insulin resistance   . Iron deficiency anemia   . GERD (gastroesophageal reflux disease)   . Cervical spondylosis   . CAD (coronary artery disease)     a) s/p DES to LAD 07/2005 b) Last Myoview low risk 11/2011 showing small fixed apical perfusion defect (prior MI vs attenuation) but no ischemia - normal EF.  . Diabetes mellitus without complication [facility])    Past Surgical History  Procedure Laterality Date  . Cardiac catheterization  06/17/2007    NORMAL. EF 60%  . Coronary stent placement  07/2005    LEFT ANTERIOR DESCENDING  . Tubal ligation    . Tonsillectomy and adenoidectomy    . Childbirth      X3  . Breast enhancement surgery    . Cervical spondylosis      SINGLE LEVEL FUSION  . Incision and drainage breast abscess  01/05/2012       . Forearm fracture surgery  2010    hand and shoulder   . Incision and drainage perirectal abscess N/A 02/18/2014    Procedure: IRRIGATION AND  DEBRIDEMENT PERIRECTAL ABSCESS;  Surgeon: Pedro Earls, MD;  Location: [facility];  Service: General;  Laterality: N/A;  . Rotator cuff repair      bilaterla  . Bulging disk      lower part of back   Family History  Problem Relation Age of Onset  . Heart attack Mother   . Diabetes Mother   . Lung cancer Mother   . Asthma Mother   . Heart disease Mother   . Suicidality Father     "killed himself"  . Allergies Other     all family--seasonal allergies  . Asthma Daughter     x 2  . Cancer Daughter     pre-cancerous polyp  . Diabetes Sister   . Cancer Sister   . Cervical cancer Daughter     cervical    Social History  Substance Use Topics  . Smoking status: Never Smoker   . Smokeless tobacco: Never Used  . Alcohol Use: 0.6 oz/week    1 Glasses of wine per week   OB History    No data available     Review of Systems  Constitutional: Negative for fever and chills.  HENT: Negative for ear pain and rhinorrhea.   Respiratory: Positive for cough and shortness of breath.   Cardiovascular: Negative for chest pain.  Skin: Negative for rash.  All other systems reviewed and are negative.     Allergies  Prednisone  Home Medications   Prior to Admission medications   Medication Sig Start Date End Date Taking? Authorizing Provider  albuterol (PROVENTIL HFA;VENTOLIN HFA) 108 (90 BASE) MCG/ACT inhaler Inhale 1 puff into the lungs every 6 (six) hours as needed for shortness of breath.   Yes Historical Provider, MD  aspirin EC 81 MG tablet Take 81 mg by mouth daily.   Yes Historical Provider, MD  benzonatate (TESSALON) 100 MG capsule Take 200 mg by mouth 2 (two) times daily as needed for cough.   Yes Historical Provider, MD  bromocriptine (PARLODEL) 2.5 MG tablet Take 0.5 tablets (1.25 mg total) by mouth at bedtime. 11/14/14  Yes Renato Shin, MD  Cholecalciferol (VITAMIN D3) 1000 UNITS CAPS Take 1,000 Units by mouth daily.    Yes Historical Provider, MD  CRESTOR 20 MG tablet TAKE  1 TABLET BY MOUTH EVERY DAY 11/16/14  Yes Burtis Junes, NP  Cyanocobalamin (VITAMIN B 12 PO) Take 500 mg by mouth 2 (two) times daily.   Yes Historical Provider, MD  diphenhydramine-acetaminophen (TYLENOL PM) 25-500 MG TABS Take 1 tablet by mouth at bedtime as needed (for sleep).   Yes Historical Provider, MD  fexofenadine (ALLEGRA) 180 MG tablet Take 180 mg by mouth 2 (two) times daily.    Yes Historical Provider, MD  FLUoxetine (PROZAC) 40 MG capsule Take 1 capsule (40 mg total) by mouth daily. 09/10/13  Yes Burtis Junes, NP  fluticasone (VERAMYST) 27.5 MCG/SPRAY nasal spray Place 2 sprays into the nose daily as needed for rhinitis.    Yes Historical Provider, MD  hydrOXYzine (ATARAX/VISTARIL) 25 MG tablet Take 0.5-1 tablets (12.5-25 mg total) by mouth at bedtime as needed for itching. PATIENT NEEDS OFFICE VISIT FOR ADDITIONAL REFILLS 05/16/15  Yes Jaynee Eagles, PA-C  ibuprofen (ADVIL,MOTRIN) 200 MG tablet Take 800 mg by mouth every 6 (six) hours as needed for moderate pain.    Yes Historical Provider, MD  Liraglutide (VICTOZA) 18 MG/3ML SOPN Inject 0.3 mLs (1.8 mg total) into the skin daily. And pen needles 1/day 05/02/15  Yes Renato Shin, MD  metFORMIN (GLUCOPHAGE-XR) 500 MG 24 hr tablet Take 2 tablets (1,000 mg total) by mouth daily with breakfast. Patient taking differently: Take 500 mg by mouth 2 (two) times daily.  05/02/15  Yes Renato Shin, MD  metoprolol succinate (TOPROL-XL) 100 MG 24 hr tablet TAKE 1 TABLET BY MOUTH DAILY WITH BREAKFAST. TAKE WITH OR IMMEDIATELY FOLLOWING A MEAL 02/20/15  Yes Larey Dresser, MD  nitroGLYCERIN (NITROSTAT) 0.4 MG SL tablet Place 0.4 mg under the tongue every 5 (five) minutes as needed. 12/11/11  Yes Dayna N Dunn, PA-C  rosuvastatin (CRESTOR) 20 MG tablet Take 20 mg by mouth daily with breakfast.   Yes Historical Provider, MD  vitamin E 400 UNIT capsule Take 400 Units by mouth daily.   Yes Historical Provider, MD  amLODipine (NORVASC) 10 MG tablet TAKE 1  TABLET BY MOUTH EVERY DAY 09/21/14   Larey Dresser, MD  azithromycin (ZITHROMAX Z-PAK) 250 MG tablet 2 po day one, then 1 daily x 4 days 05/18/15   Veryl Speak, MD  cetirizine (ZYRTEC) 10 MG tablet Take 1 tablet (10 mg total) by mouth daily. Patient not taking: Reported on 05/21/2015 11/13/14   Tishira R Brewington, PA-C  guaiFENesin-codeine 100-10 MG/5ML syrup Take 10 mLs by mouth every 6 (six) hours as needed for cough. 05/18/15   Veryl Speak, MD  omeprazole (PRILOSEC) 40 MG capsule Take 40 mg by mouth daily with breakfast.    Historical Provider, MD   BP 159/72 mmHg  Pulse 102  Temp(Src) 97.9 F (36.6 C) (Oral)  Resp 18  Ht 5' (1.524 m)  Wt 181 lb 3.5 oz (82.2 kg)  BMI 35.39 kg/m2  SpO2 94% Physical Exam  Constitutional: She is oriented to person, place, and time. She appears well-developed and well-nourished.  HENT:  Head: Normocephalic and atraumatic.  Right Ear: External ear normal.  Left Ear: External ear normal.  Eyes: Conjunctivae and EOM are normal. Pupils are equal, round, and reactive to light.  Neck: Normal range of motion. Neck supple.  Cardiovascular: Normal rate, regular rhythm, normal heart sounds and intact distal pulses.   Pulmonary/Chest: Effort normal and breath sounds normal.  Abdominal: Soft. Bowel sounds are normal. There is no tenderness.  Musculoskeletal: Normal range of motion.  Neurological: She is alert and oriented to person, place, and time.  Skin: Skin is warm and dry.  Vitals reviewed.   ED Course  Procedures (including critical care time) Labs Review Labs Reviewed  CBC WITH DIFFERENTIAL/PLATELET - Abnormal; Notable for the following:    WBC 11.0 (*)    MCH 25.2 (*)    All other components within normal limits  BASIC METABOLIC PANEL - Abnormal; Notable for the following:    Chloride 96 (*)    Glucose, Bld 139 (*)    All other components within normal limits  BASIC METABOLIC PANEL - Abnormal; Notable for the following:    Sodium 134 (*)     Chloride 95 (*)    Glucose, Bld 194 (*)    All other components within normal limits  CBC - Abnormal; Notable for the following:    WBC 10.6 (*)    Hemoglobin 11.6 (*)    MCH 25.8 (*)    All other components within normal limits  GLUCOSE, CAPILLARY - Abnormal; Notable for the following:    Glucose-Capillary 198 (*)    All other components within normal limits  GLUCOSE, CAPILLARY - Abnormal; Notable for the following:    Glucose-Capillary 147 (*)    All other components within normal limits  GLUCOSE, CAPILLARY - Abnormal; Notable for the following:    Glucose-Capillary 140 (*)    All other components within normal limits  GLUCOSE, CAPILLARY - Abnormal; Notable for the following:    Glucose-Capillary 186 (*)    All other components within normal limits  STREP PNEUMONIAE URINARY ANTIGEN - Abnormal; Notable for the following:    Strep Pneumo Urinary Antigen POSITIVE (*)    All other components within normal limits  GLUCOSE, CAPILLARY - Abnormal; Notable for the following:    Glucose-Capillary 192 (*)    All other components within normal limits  CULTURE, EXPECTORATED SPUTUM-ASSESSMENT  CULTURE, BLOOD (ROUTINE X 2)  CULTURE, BLOOD (ROUTINE X 2)  GRAM STAIN  CULTURE, RESPIRATORY (NON-EXPECTORATED)  TROPONIN I  TROPONIN I  TROPONIN I  HIV ANTIBODY (ROUTINE TESTING)  D-DIMER, QUANTITATIVE (NOT AT [facility])  LEGIONELLA PNEUMOPHILA SEROGP 1 UR AG  STREP PNEUMONIAE URINARY ANTIGEN    Imaging Review Dg Chest 2 View  05/21/2015  CLINICAL DATA:  Cough EXAM: CHEST  2 VIEW COMPARISON:  06/14/2015 chest radiograph FINDINGS: Surgical hardware overlies the lower cervical spine. Stable cardiomediastinal silhouette with normal heart size. No pneumothorax. No pleural effusion. Mild hazy opacity is noted in the left mid lung. Otherwise clear lungs. No pulmonary edema. Stable calcified right breast prosthesis. IMPRESSION:  Mild hazy opacity in the left mid lung, which could represent a developing  pneumonia. Recommend follow-up PA and lateral post treatment chest radiographs in 6-8 weeks. Electronically Signed   By: Ilona Sorrel M.D.   On: 05/21/2015 14:48   I have personally reviewed and evaluated these images and lab results as part of my medical decision-making.   EKG Interpretation None      MDM   Final diagnoses:  None    64 y.o. female with pertinent PMH of HTN, HLD, anxiety, CAD presents with PNA with new O2 requirement.  Admitted in stable condition.    I have reviewed all laboratory and imaging studies if ordered as above  CAP   Debby Freiberg, MD 05/23/15 0021

## 2015-05-22 DIAGNOSIS — I2510 Atherosclerotic heart disease of native coronary artery without angina pectoris: Secondary | ICD-10-CM | POA: Diagnosis not present

## 2015-05-22 DIAGNOSIS — R05 Cough: Secondary | ICD-10-CM | POA: Diagnosis not present

## 2015-05-22 DIAGNOSIS — E119 Type 2 diabetes mellitus without complications: Secondary | ICD-10-CM | POA: Diagnosis not present

## 2015-05-22 DIAGNOSIS — I10 Essential (primary) hypertension: Secondary | ICD-10-CM | POA: Diagnosis not present

## 2015-05-22 DIAGNOSIS — J189 Pneumonia, unspecified organism: Secondary | ICD-10-CM | POA: Diagnosis not present

## 2015-05-22 DIAGNOSIS — J13 Pneumonia due to Streptococcus pneumoniae: Secondary | ICD-10-CM | POA: Diagnosis not present

## 2015-05-22 DIAGNOSIS — E785 Hyperlipidemia, unspecified: Secondary | ICD-10-CM | POA: Diagnosis not present

## 2015-05-22 LAB — BASIC METABOLIC PANEL
Anion gap: 9 (ref 5–15)
BUN: 11 mg/dL (ref 6–20)
CO2: 30 mmol/L (ref 22–32)
Calcium: 9.2 mg/dL (ref 8.9–10.3)
Chloride: 95 mmol/L — ABNORMAL LOW (ref 101–111)
Creatinine, Ser: 0.68 mg/dL (ref 0.44–1.00)
GFR calc Af Amer: >60 mL/min (ref >60)
GFR calc non Af Amer: >60 mL/min (ref >60)
Glucose, Bld: 194 mg/dL — ABNORMAL HIGH (ref 65–99)
Potassium: 3.5 mmol/L (ref 3.5–5.1)
Sodium: 134 mmol/L — ABNORMAL LOW (ref 135–145)

## 2015-05-22 LAB — CBC
HCT: 36.6 % (ref 36.0–46.0)
Hemoglobin: 11.6 g/dL — ABNORMAL LOW (ref 12.0–15.0)
MCH: 25.8 pg — ABNORMAL LOW (ref 26.0–34.0)
MCHC: 31.7 g/dL (ref 30.0–36.0)
MCV: 81.5 fL (ref 78.0–100.0)
Platelets: 230 10*3/uL (ref 150–400)
RBC: 4.49 MIL/uL (ref 3.87–5.11)
RDW: 14.6 % (ref 11.5–15.5)
WBC: 10.6 10*3/uL — ABNORMAL HIGH (ref 4.0–10.5)

## 2015-05-22 LAB — GLUCOSE, CAPILLARY
Glucose-Capillary: 147 mg/dL — ABNORMAL HIGH (ref 65–99)
Glucose-Capillary: 140 mg/dL — ABNORMAL HIGH (ref 65–99)
Glucose-Capillary: 186 mg/dL — ABNORMAL HIGH (ref 65–99)
Glucose-Capillary: 192 mg/dL — ABNORMAL HIGH (ref 65–99)

## 2015-05-22 LAB — TROPONIN I
Troponin I: <0.03 ng/mL (ref <0.031)
Troponin I: <0.03 ng/mL (ref <0.031)

## 2015-05-22 LAB — HIV ANTIBODY (ROUTINE TESTING W REFLEX): HIV Screen 4th Generation wRfx: NONREACTIVE

## 2015-05-22 LAB — STREP PNEUMONIAE URINARY ANTIGEN: Strep Pneumo Urinary Antigen: POSITIVE — AB

## 2015-05-22 LAB — D-DIMER, QUANTITATIVE: D-Dimer, Quant: <0.27 ug/mL-FEU (ref 0.00–0.48)

## 2015-05-22 MED ORDER — AMOXICILLIN-POT CLAVULANATE 875-125 MG PO TABS
1.0000 | ORAL_TABLET | Freq: Two times a day (BID) | ORAL | Status: DC
  Administered 2015-05-23: 1 via ORAL
  Filled 2015-05-22: qty 1

## 2015-05-22 MED ORDER — IPRATROPIUM-ALBUTEROL 0.5-2.5 (3) MG/3ML IN SOLN
3.0000 mL | Freq: Three times a day (TID) | RESPIRATORY_TRACT | Status: DC
  Administered 2015-05-23 (×2): 3 mL via RESPIRATORY_TRACT
  Filled 2015-05-22 (×2): qty 3

## 2015-05-22 MED ORDER — LEVOFLOXACIN 750 MG PO TABS
750.0000 mg | ORAL_TABLET | Freq: Every day | ORAL | Status: DC
  Administered 2015-05-22: 750 mg via ORAL
  Filled 2015-05-22: qty 1

## 2015-05-22 MED ORDER — IPRATROPIUM-ALBUTEROL 0.5-2.5 (3) MG/3ML IN SOLN
3.0000 mL | RESPIRATORY_TRACT | Status: DC
  Administered 2015-05-22 (×3): 3 mL via RESPIRATORY_TRACT
  Filled 2015-05-22 (×3): qty 3

## 2015-05-22 NOTE — Progress Notes (Signed)
Family Medicine Teaching Service Daily Progress Note Intern Pager: [phone]  Patient name: Destiny Ballard Medical record number: [id number] Date of birth: [birth year] Age: 64 y.o. Gender: female  Primary Care Provider: Lamar Blinks, MD Consultants: None Code Status: Full  Pt Overview and Major Events to Date:  11/6: Admitted to [place] with CAP.  Assessment and Plan: Destiny Ballard is a 64 y.o. female presenting with a worsening productive cough and shortness of breath. PMH is significant for type 2 diabetes, hypertension, hyperlipidemia, coronary artery disease, GERD, and OSA.  # Community-acquired pneumonia: Worsening productive cough and shortness of breath for one week. Chest pain only with coughing. Left mid lobe consolidation noted on chest x-ray. Has taken 3 days of Z-Pak which had been prescribed on Friday. Group A strep culture negative from 11/3 ED visit. Afebrile overnight, WBC 10.6 this am. - Levaquin 500mg  po qd - Pt requiring 2L [place] overnight. Will wean as tolerated. - Will consider scheduling Duonebs q4hrs instead of PRN. - Tessalon for coughing as needed - Sputum cultures and sputum gram stain pending. - Blood cultures pending. - Strep pneumo urinary antigen, Legionella urinary antigen pending. - May need PFTs as outpatient. (Due to long-term secondhand smoke exposure) - Pt to receive flu shot today.  # Coronary artery disease: PCI in the LAD in 2007; last Myoview on 05/25/14 "looks ok" according to documentation. - Continue Aspirin 81 mg - Continue Metoprolol XL 100 mg  # Type 2 diabetes: Recent A1c 7.9 (10/18); Holding home medication regimen (Metformin 1,000mg  qd, Victoza 1.8mg  qd). CBG last night at bedtime was 198. - SSI moderate  - CBGs before meals and at bedtime.  # Hypertension: BPs ranging from 132-158/56-76 since admission. - Metoprolol XL 100mg  qd  # Hyperlipidemia - Continue Crestor 20 mg  # Anxiety/depression - Continue Prozac 40 mg  FEN/GI:  KVO IV fluids; heart healthy car modified diet Prophylaxis: Subcutaneous heparin  Disposition: Anticipate home in 1-3 days, pending clinical improvement and weaning from supplemental O2.   Subjective:  Pt states she is feeling "yucky" this morning. She states her breathing is "okay" as long is she is not moving around. Still requiring 2L O2. Has been using her incentive spirometry. Would like something for her cough, as she is having chest and abdominal pain every time she coughs.  Objective: Temp:  [98.5 F (36.9 C)-98.9 F (37.2 C)] 98.5 F (36.9 C) (11/07 0415) Pulse Rate:  [97-110] 106 (11/07 0415) Resp:  [17-22] 18 (11/07 0415) BP: (132-158)/(56-76) 158/56 mmHg (11/07 0415) SpO2:  [87 %-95 %] 94 % (11/07 0415) Weight:  [180 lb (81.647 kg)] 180 lb (81.647 kg) (11/06 1402) Physical Exam: General -- oriented x3, pleasant and cooperative. Speech with congested tone. Coughing intermittently throughout exam. HEENT -- Head is normocephalic. EOMI. Dry mucous membranes. Neck -- supple Chest -- diminished sounds on the left side, no wheezes, no crackles, Aspers in place. Cardiac -- RRR. No murmurs noted. 2+ DP pulses bilaterally.  Abdomen -- soft, nontender. No masses palpable. Bowel sounds present. CNS -- CN 2-12 grossly intact; awake, alert, oriented. Extremeties - no tenderness or effusions noted. ROM good. Skin -- no rashes or lesions  Laboratory:  Recent Labs Lab 05/21/15 1452 05/22/15 0147  WBC 11.0* 10.6*  HGB 12.1 11.6*  HCT 38.6 36.6  PLT 286 230    Recent Labs Lab 05/21/15 1452 05/22/15 0147  NA 136 134*  K 3.8 3.5  CL 96* 95*  CO2 30 30  BUN 12 11  CREATININE 0.59 0.68  CALCIUM 9.4 9.2  GLUCOSE 139* 194*    Imaging/Diagnostic Tests: CXR (11/6): Mild hazy opacity in the left mid lung, which could represent a developing pneumonia. Recommend f/u PA and lateral post treatment chest radiographs in 6-8 weeks.  Sela Hua, MD 05/22/2015, 7:21 AM PGY-1, [place] Intern pager: [phone], text pages welcome

## 2015-05-22 NOTE — Discharge Summary (Signed)
[facility] Discharge Summary  Patient name: Destiny Ballard Medical record number: [id number] Date of birth: 1951-01-03 Age: 64 y.o. Gender: female Date of Admission: 05/21/2015  Date of Discharge: 05/23/15 Admitting Physician: Zenia Resides, MD  Primary Care Provider: Lamar Blinks, MD Consultants: None  Indication for Hospitalization: CAP requiring 2L O2 by [place].  Discharge Diagnoses/Problem List:  Community-acquired pneumonia T2DM HTN  HLD CAD  Disposition: Home  Discharge Condition: Stable, improved  Discharge Exam:  General -- oriented x3, pleasant and cooperative. Speech with congested tone. Coughing intermittently throughout exam. HEENT -- Head is normocephalic. EOMI. Dry mucous membranes. Neck -- supple Chest -- moving better air than yesterday, still with diminished breath sounds in the lung bases bilaterally, no wheezes, no crackles, [place] in place. Cardiac -- RRR. No murmurs noted. 2+ DP pulses bilaterally.  Abdomen -- soft, nontender. No masses palpable. Bowel sounds present. CNS -- CN 2-12 grossly intact; awake, alert, oriented. Extremeties - no tenderness or effusions noted. ROM good. Skin -- no rashes or lesions  Brief Hospital Course:  Destiny Ballard is a 64 year old female who presented with worsening productive cough and shortness of breath. In the ED, she had O2 saturations down to 88% and was placed on 2L O2 by nasal cannula. Troponins were negative x 3. CXR showed mild hazy opacity in the left mid lung, representing a developing pneumonia. Given her shortness of breath and tachycardia to 110, we were concerned for possible PE. D-dimer was obtained and was normal so we did not procede with a CTA chest. She was started on Levaquin 500mg  PO for CAP and was given Tessalon [place] for cough. She was also treated with duonebs every 4 hours. We obtained sputum cultures, blood cultures, Strep pneumo urinary antigen, and Legionella urinary  antigen. Sputum culture showed normal oropharyngeal [place] and blood cultures showed no growth. Strep pneumo urinary antigen was positive. She received about 24 hours of Levaquin and was then transitioned to Augmentin to complete a 7 day course. She was able to be weaned from the O2 and on the day of discharge, she had O2 sats between 95-97 on room air. Her other chronic conditions including CAD, T2DM, HTN, HLD, anxiety/depression were stable throughout hospitalization and no changes were made to her medications.   Issues for Follow Up:  1. Per radiology, Ms. Frane should have a repeat PA and lateral CXR in 6-8 weeks to ensure resolution. 2. Pt's daughter informed us that Pt does not smoke, but she has been exposed to heavy second hand smoke from her husband over the past 10 years. Per the daughter, she has had chronic productive cough and worsening shortness of breath in recent months. We recommend PFTs to evaluate Pt for COPD, so that she may be started on controller medications if appropriate.  Significant Procedures: None  Significant Labs and Imaging:   Recent Labs Lab 05/21/15 1452 05/22/15 0147  WBC 11.0* 10.6*  HGB 12.1 11.6*  HCT 38.6 36.6  PLT 286 230    Recent Labs Lab 05/21/15 1452 05/22/15 0147  NA 136 134*  K 3.8 3.5  CL 96* 95*  CO2 30 30  GLUCOSE 139* 194*  BUN 12 11  CREATININE 0.59 0.68  CALCIUM 9.4 9.2   Troponins: <0.03, <0.03, <0.03 D-dimer <0.27 HIV negative Strep pneumo urinary antigen positive  Results/Tests Pending at Time of Discharge: None  Discharge Medications:    Medication List    ASK your doctor about these medications  albuterol 108 (90 BASE) MCG/ACT inhaler  Commonly known as:  PROVENTIL HFA;VENTOLIN HFA  Inhale 1 puff into the lungs every 6 (six) hours as needed for shortness of breath.     amLODipine 10 MG tablet  Commonly known as:  NORVASC  TAKE 1 TABLET BY MOUTH EVERY DAY     aspirin EC 81 MG tablet  Take 81 mg by  mouth daily.     azithromycin 250 MG tablet  Commonly known as:  ZITHROMAX Z-PAK  2 po day one, then 1 daily x 4 days     benzonatate 100 MG capsule  Commonly known as:  TESSALON  Take 200 mg by mouth 2 (two) times daily as needed for cough.     bromocriptine 2.5 MG tablet  Commonly known as:  PARLODEL  Take 0.5 tablets (1.25 mg total) by mouth at bedtime.     cetirizine 10 MG tablet  Commonly known as:  ZYRTEC  Take 1 tablet (10 mg total) by mouth daily.     diphenhydramine-acetaminophen 25-500 MG Tabs tablet  Commonly known as:  TYLENOL PM  Take 1 tablet by mouth at bedtime as needed (for sleep).     fexofenadine 180 MG tablet  Commonly known as:  ALLEGRA  Take 180 mg by mouth 2 (two) times daily.     FLUoxetine 40 MG capsule  Commonly known as:  PROZAC  Take 1 capsule (40 mg total) by mouth daily.     fluticasone 27.5 MCG/SPRAY nasal spray  Commonly known as:  VERAMYST  Place 2 sprays into the nose daily as needed for rhinitis.     guaiFENesin-codeine 100-10 MG/5ML syrup  Take 10 mLs by mouth every 6 (six) hours as needed for cough.     hydrOXYzine 25 MG tablet  Commonly known as:  ATARAX/VISTARIL  Take 0.5-1 tablets (12.5-25 mg total) by mouth at bedtime as needed for itching. PATIENT NEEDS OFFICE VISIT FOR ADDITIONAL REFILLS     ibuprofen 200 MG tablet  Commonly known as:  ADVIL,MOTRIN  Take 800 mg by mouth every 6 (six) hours as needed for moderate pain.     Liraglutide 18 MG/3ML Sopn  Commonly known as:  VICTOZA  Inject 0.3 mLs (1.8 mg total) into the skin daily. And pen needles 1/day     metFORMIN 500 MG 24 hr tablet  Commonly known as:  GLUCOPHAGE-XR  Take 2 tablets (1,000 mg total) by mouth daily with breakfast.     metoprolol succinate 100 MG 24 hr tablet  Commonly known as:  TOPROL-XL  TAKE 1 TABLET BY MOUTH DAILY WITH BREAKFAST. TAKE WITH OR IMMEDIATELY FOLLOWING A MEAL     nitroGLYCERIN 0.4 MG SL tablet  Commonly known as:  NITROSTAT  Place 0.4  mg under the tongue every 5 (five) minutes as needed.     omeprazole 40 MG capsule  Commonly known as:  PRILOSEC  Take 40 mg by mouth daily with breakfast.     rosuvastatin 20 MG tablet  Commonly known as:  CRESTOR  Take 20 mg by mouth daily with breakfast.     CRESTOR 20 MG tablet  Generic drug:  rosuvastatin  TAKE 1 TABLET BY MOUTH EVERY DAY     VITAMIN B 12 PO  Take 500 mg by mouth 2 (two) times daily.     Vitamin D3 1000 UNITS Caps  Take 1,000 Units by mouth daily.     vitamin E 400 UNIT capsule  Take 400 Units by mouth daily.  Discharge Instructions: Please refer to Patient Instructions section of EMR for full details.  Patient was counseled important signs and symptoms that should prompt return to medical care, changes in medications, dietary instructions, activity restrictions, and follow up appointments.   Follow-Up Appointments:   Sela Hua, MD 05/22/2015, 7:35 AM PGY-1, [place]

## 2015-05-23 DIAGNOSIS — Z7982 Long term (current) use of aspirin: Secondary | ICD-10-CM | POA: Diagnosis not present

## 2015-05-23 DIAGNOSIS — F329 Major depressive disorder, single episode, unspecified: Secondary | ICD-10-CM | POA: Diagnosis present

## 2015-05-23 DIAGNOSIS — I10 Essential (primary) hypertension: Secondary | ICD-10-CM | POA: Diagnosis not present

## 2015-05-23 DIAGNOSIS — R05 Cough: Secondary | ICD-10-CM | POA: Diagnosis present

## 2015-05-23 DIAGNOSIS — Z7984 Long term (current) use of oral hypoglycemic drugs: Secondary | ICD-10-CM | POA: Diagnosis not present

## 2015-05-23 DIAGNOSIS — F419 Anxiety disorder, unspecified: Secondary | ICD-10-CM | POA: Diagnosis present

## 2015-05-23 DIAGNOSIS — E119 Type 2 diabetes mellitus without complications: Secondary | ICD-10-CM | POA: Diagnosis not present

## 2015-05-23 DIAGNOSIS — E785 Hyperlipidemia, unspecified: Secondary | ICD-10-CM | POA: Diagnosis not present

## 2015-05-23 DIAGNOSIS — Z23 Encounter for immunization: Secondary | ICD-10-CM | POA: Diagnosis not present

## 2015-05-23 DIAGNOSIS — Z888 Allergy status to other drugs, medicaments and biological substances status: Secondary | ICD-10-CM | POA: Diagnosis not present

## 2015-05-23 DIAGNOSIS — I2510 Atherosclerotic heart disease of native coronary artery without angina pectoris: Secondary | ICD-10-CM | POA: Diagnosis not present

## 2015-05-23 DIAGNOSIS — J13 Pneumonia due to Streptococcus pneumoniae: Secondary | ICD-10-CM | POA: Diagnosis not present

## 2015-05-23 DIAGNOSIS — G4733 Obstructive sleep apnea (adult) (pediatric): Secondary | ICD-10-CM | POA: Diagnosis present

## 2015-05-23 DIAGNOSIS — Z955 Presence of coronary angioplasty implant and graft: Secondary | ICD-10-CM | POA: Diagnosis not present

## 2015-05-23 DIAGNOSIS — I119 Hypertensive heart disease without heart failure: Secondary | ICD-10-CM | POA: Insufficient documentation

## 2015-05-23 DIAGNOSIS — Z6835 Body mass index (BMI) 35.0-35.9, adult: Secondary | ICD-10-CM | POA: Diagnosis not present

## 2015-05-23 DIAGNOSIS — J189 Pneumonia, unspecified organism: Secondary | ICD-10-CM | POA: Diagnosis not present

## 2015-05-23 DIAGNOSIS — K219 Gastro-esophageal reflux disease without esophagitis: Secondary | ICD-10-CM | POA: Diagnosis present

## 2015-05-23 LAB — GLUCOSE, CAPILLARY
Glucose-Capillary: 182 mg/dL — ABNORMAL HIGH (ref 65–99)
Glucose-Capillary: 171 mg/dL — ABNORMAL HIGH (ref 65–99)

## 2015-05-23 MED ORDER — GUAIFENESIN-CODEINE 100-10 MG/5ML PO SOLN: 10.0000 mL | Freq: Four times a day (QID) | ORAL | Status: DC | PRN

## 2015-05-23 MED ORDER — DM-GUAIFENESIN ER 30-600 MG PO TB12
1.0000 | ORAL_TABLET | Freq: Two times a day (BID) | ORAL | Status: DC
  Administered 2015-05-23: 1 via ORAL
  Filled 2015-05-23: qty 1

## 2015-05-23 MED ORDER — BENZONATATE 200 MG PO CAPS: 200.0000 mg | ORAL_CAPSULE | Freq: Three times a day (TID) | ORAL | Status: DC | PRN

## 2015-05-23 MED ORDER — AMOXICILLIN-POT CLAVULANATE 875-125 MG PO TABS: 1.0000 | ORAL_TABLET | Freq: Two times a day (BID) | ORAL | Status: DC

## 2015-05-23 MED ORDER — BENZONATATE 100 MG PO CAPS: 200.0000 mg | ORAL_CAPSULE | Freq: Two times a day (BID) | ORAL | Status: DC | PRN

## 2015-05-23 NOTE — Discharge Instructions (Signed)
You were hospitalized for pneumonia. We started you on an IV medication called Levaquin. One of your blood tests came back positive for a bacteria called Strep pneumoniae. We switched your antibiotic to Augmentin, which is an antibiotic that comes in pill form. Please continue to take this medication twice a day for 4 more days after today.  Please schedule an appointment to be seen at your primary doctor's office within the next week so that she can make sure your pneumonia has resolved.  Please see your doctor or return to the ED if you have worsening shortness of breath, fevers >101 after you finish the antibiotics, or if you are unable to keep down liquids.

## 2015-05-23 NOTE — Consult Note (Signed)
   [facility] CM Inpatient Consult   05/23/2015  Destiny Ballard July 07, 1951 [id number] Thank you for this consult.   This patient is Not eligible for [facility] Care Management Services.   Reason:  Not a beneficiary currently attributed to one of the [place].  Membership roster was used to verify non- eligible status. Met with the patient and she states she has not been enrolled in Medicare for a whole year. Explained to the patient that she is currently not on the [place] registry attributed population but maybe eligible at a later time. Encouraged patient to obtain as much education from inpatient staff and post hospital follow up with her primary care provider.  Patient verbalized understanding.  Natividad Brood, RN BSN [facility] Liaison  [phone] business mobile phone

## 2015-05-23 NOTE — Progress Notes (Signed)
Family Medicine Teaching Service Daily Progress Note Intern Pager: [phone]  Patient name: Destiny Ballard Medical record number: [id number] Date of birth: 08/07/1950 Age: 64 y.o. Gender: female  Primary Care Provider: Lamar Blinks, MD Consultants: None Code Status: Full  Pt Overview and Major Events to Date:  11/6: Admitted to [facility] with CAP.  Assessment and Plan: Destiny Ballard is a 64 y.o. female presenting with a worsening productive cough and shortness of breath. PMH is significant for type 2 diabetes, hypertension, hyperlipidemia, coronary artery disease, GERD, and OSA.  Community-acquired pneumonia: Worsening productive cough and shortness of breath for one week. Left mid lobe consolidation noted on chest x-ray. Has taken 3 days of Z-Pak which had been prescribed on Friday. Afebrile overnight, WBC 10.6 yesterday. - Strep pneumo urinary antigen is positive, so Pt was transitioned from Levaquin to Augmentin yesterday. - Weaned from 3L O2 on admission to RA yesterday. O2 sats ranging from 91-96 on RA overnight. - Will continue scheduled duonebs tid. - Tessalon for coughing as needed - Sputum cultures and sputum gram stain pending. - Blood cultures showing NG x < 12 hours. - Legionella urinary antigen pending. - May need PFTs as outpatient.  - Pt has received flu shot this admission.  Coronary artery disease: PCI in the LAD in 2007; last Myoview on 05/25/14 "looks ok" according to documentation. - Continue Aspirin 81 mg - Continue Metoprolol XL 100 mg  Type 2 diabetes: Recent A1c 7.9 (10/18); Holding home medication regimen (Metformin 1,000mg  qd, Victoza 1.8mg  qd). CBG last night at bedtime was 198. - SSI moderate  - CBGs before meals and at bedtime.  Hypertension: BPs ranging from 142-159/72-74 since admission. - Metoprolol XL 100mg  qd  Hyperlipidemia - Continue Crestor 20 mg  # Anxiety/depression - Continue Prozac 40 mg  FEN/GI: KVO IV fluids; heart healthy carb  modified diet Prophylaxis: Subcutaneous heparin  Disposition: Home likely today.  Subjective:  Pt states she was up all night coughing. She endorses chest and abdominal soreness with coughing. She has been breathing fine since being weaned to room air. No fevers or chills.   Objective: Temp:  [97.9 F (36.6 C)-98.6 F (37 C)] 98 F (36.7 C) (11/08 0500) Pulse Rate:  [97-105] 99 (11/08 0500) Resp:  [17-18] 17 (11/08 0500) BP: (142-159)/(68-74) 148/74 mmHg (11/08 0500) SpO2:  [90 %-97 %] 91 % (11/08 0500) Weight:  [181 lb 3.5 oz (82.2 kg)] 181 lb 3.5 oz (82.2 kg) (11/07 2142) Physical Exam: General -- oriented x3, pleasant and cooperative. Speech with congested tone. Coughing intermittently throughout exam. HEENT -- Head is normocephalic. EOMI. Dry mucous membranes. Neck -- supple Chest -- moving better air than yesterday, still with diminished breath sounds in the lung bases bilaterally, no wheezes, no crackles, [place] in place. Cardiac -- RRR. No murmurs noted. 2+ DP pulses bilaterally.  Abdomen -- soft, nontender. No masses palpable. Bowel sounds present. CNS -- CN 2-12 grossly intact; awake, alert, oriented. Extremeties - no tenderness or effusions noted. ROM good. Skin -- no rashes or lesions  Laboratory:  Recent Labs Lab 05/21/15 1452 05/22/15 0147  WBC 11.0* 10.6*  HGB 12.1 11.6*  HCT 38.6 36.6  PLT 286 230    Recent Labs Lab 05/21/15 1452 05/22/15 0147  NA 136 134*  K 3.8 3.5  CL 96* 95*  CO2 30 30  BUN 12 11  CREATININE 0.59 0.68  CALCIUM 9.4 9.2  GLUCOSE 139* 194*    Imaging/Diagnostic Tests: CXR (11/6): Mild hazy opacity in the  left mid lung, which could represent a developing pneumonia. Recommend f/u PA and lateral post treatment chest radiographs in 6-8 weeks.  Sela Hua, MD 05/23/2015, 6:40 AM PGY-1, [facility] Intern pager: [phone], text pages welcome

## 2015-05-23 NOTE — Progress Notes (Signed)
Destiny Ballard to be D/C'd Home per MD order.  Discussed prescriptions and follow up appointments with the patient. Prescriptions given to patient, medication list explained in detail. Pt verbalized understanding.    Medication List    STOP taking these medications        azithromycin 250 MG tablet  Commonly known as:  ZITHROMAX Z-PAK      TAKE these medications        albuterol 108 (90 BASE) MCG/ACT inhaler  Commonly known as:  PROVENTIL HFA;VENTOLIN HFA  Inhale 1 puff into the lungs every 6 (six) hours as needed for shortness of breath.     amLODipine 10 MG tablet  Commonly known as:  NORVASC  TAKE 1 TABLET BY MOUTH EVERY DAY     amoxicillin-clavulanate 875-125 MG tablet  Commonly known as:  AUGMENTIN  Take 1 tablet by mouth every 12 (twelve) hours. For the next 4 days.     aspirin EC 81 MG tablet  Take 81 mg by mouth daily.     benzonatate 100 MG capsule  Commonly known as:  TESSALON  Take 2 capsules (200 mg total) by mouth 2 (two) times daily as needed for cough.     benzonatate 200 MG capsule  Commonly known as:  TESSALON  Take 1 capsule (200 mg total) by mouth 3 (three) times daily as needed for cough.     bromocriptine 2.5 MG tablet  Commonly known as:  PARLODEL  Take 0.5 tablets (1.25 mg total) by mouth at bedtime.     cetirizine 10 MG tablet  Commonly known as:  ZYRTEC  Take 1 tablet (10 mg total) by mouth daily.     CRESTOR 20 MG tablet  Generic drug:  rosuvastatin  TAKE 1 TABLET BY MOUTH EVERY DAY     diphenhydramine-acetaminophen 25-500 MG Tabs tablet  Commonly known as:  TYLENOL PM  Take 1 tablet by mouth at bedtime as needed (for sleep).     fexofenadine 180 MG tablet  Commonly known as:  ALLEGRA  Take 180 mg by mouth 2 (two) times daily.     FLUoxetine 40 MG capsule  Commonly known as:  PROZAC  Take 1 capsule (40 mg total) by mouth daily.     fluticasone 27.5 MCG/SPRAY nasal spray  Commonly known as:  VERAMYST  Place 2 sprays into the  nose daily as needed for rhinitis.     guaiFENesin-codeine 100-10 MG/5ML syrup  Take 10 mLs by mouth every 6 (six) hours as needed for cough.     guaiFENesin-codeine 100-10 MG/5ML syrup  Take 10 mLs by mouth every 6 (six) hours as needed for cough.     hydrOXYzine 25 MG tablet  Commonly known as:  ATARAX/VISTARIL  Take 0.5-1 tablets (12.5-25 mg total) by mouth at bedtime as needed for itching. PATIENT NEEDS OFFICE VISIT FOR ADDITIONAL REFILLS     ibuprofen 200 MG tablet  Commonly known as:  ADVIL,MOTRIN  Take 800 mg by mouth every 6 (six) hours as needed for moderate pain.     Liraglutide 18 MG/3ML Sopn  Commonly known as:  VICTOZA  Inject 0.3 mLs (1.8 mg total) into the skin daily. And pen needles 1/day     metFORMIN 500 MG 24 hr tablet  Commonly known as:  GLUCOPHAGE-XR  Take 2 tablets (1,000 mg total) by mouth daily with breakfast.     metoprolol succinate 100 MG 24 hr tablet  Commonly known as:  TOPROL-XL  TAKE 1 TABLET BY MOUTH  DAILY WITH BREAKFAST. TAKE WITH OR IMMEDIATELY FOLLOWING A MEAL     nitroGLYCERIN 0.4 MG SL tablet  Commonly known as:  NITROSTAT  Place 0.4 mg under the tongue every 5 (five) minutes as needed.     omeprazole 40 MG capsule  Commonly known as:  PRILOSEC  Take 40 mg by mouth daily with breakfast.     VITAMIN B 12 PO  Take 500 mg by mouth 2 (two) times daily.     Vitamin D3 1000 UNITS Caps  Take 1,000 Units by mouth daily.     vitamin E 400 UNIT capsule  Take 400 Units by mouth daily.        Filed Vitals:   05/23/15 0500  BP: 148/74  Pulse: 99  Temp: 98 F (36.7 C)  Resp: 17    Skin clean, dry and intact without evidence of skin break down, no evidence of skin tears noted. IV catheter discontinued intact. Site without signs and symptoms of complications. Dressing and pressure applied. Pt denies pain at this time. No complaints noted.  An After Visit Summary was printed and given to the patient. Patient escorted via [place], and D/C  home via private auto.  Matisse Roskelley A 05/23/2015 6:32 PM

## 2015-05-23 NOTE — Care Management Obs Status (Signed)
[facility] NOTIFICATION   Patient Details  Name: DEARI SESSLER MRN: [id number] Date of Birth: Jan 26, 1951   Medicare Observation Status Notification Given:  Yes    Alem Fahl, Rory Percy, RN 05/23/2015, 10:12 AM

## 2015-05-24 LAB — CULTURE, RESPIRATORY
Culture: NORMAL
Gram Stain: NONE SEEN

## 2015-05-24 LAB — CULTURE, RESPIRATORY W GRAM STAIN

## 2015-05-26 LAB — CULTURE, BLOOD (ROUTINE X 2)
Culture: NO GROWTH
Culture: NO GROWTH

## 2015-05-31 ENCOUNTER — Telehealth: Payer: Self-pay | Admitting: Emergency Medicine

## 2015-05-31 DIAGNOSIS — R05 Cough: Secondary | ICD-10-CM

## 2015-05-31 DIAGNOSIS — R059 Cough, unspecified: Secondary | ICD-10-CM

## 2015-05-31 NOTE — Telephone Encounter (Signed)
Pt scheduled for OV with TP in [place] 06/01/15 at 345p CXR ordered. Will send to [facility] as FYI. Nothing further needed.

## 2015-05-31 NOTE — Telephone Encounter (Signed)
Called and spoke to pt. Pt c/o no prod cough, chest congestion, increase in SOB, low energy, and chest tightness when coughing x 3 days. Pt stated when she was d/c from the hospital she was given Augmentin. She stated she started to feel better when taking the abx but once she completed the abx she started to feel worse. Pt was a former [facility] pt. There are no current openings for OV and no recall in system for pt to be seen by a new provider.   Tammy please advise. Thanks.

## 2015-05-31 NOTE — Telephone Encounter (Signed)
Can see Thursday in [place] or even [place] w/ CXR . Please contact office for sooner follow up if symptoms do not improve or worsen or seek emergency care

## 2015-06-01 ENCOUNTER — Ambulatory Visit (INDEPENDENT_AMBULATORY_CARE_PROVIDER_SITE_OTHER)
Admission: RE | Admit: 2015-06-01 | Discharge: 2015-06-01 | Disposition: A | Discharge status: Home / Self Care | Payer: Medicare Other | Source: Ambulatory Visit | Attending: Adult Health | Admitting: Adult Health

## 2015-06-01 ENCOUNTER — Encounter: Payer: Self-pay | Admitting: Adult Health

## 2015-06-01 ENCOUNTER — Ambulatory Visit (INDEPENDENT_AMBULATORY_CARE_PROVIDER_SITE_OTHER): Payer: Medicare Other | Admitting: Adult Health

## 2015-06-01 VITALS — BP 126/72 | HR 87 | Temp 98.4°F | Ht 60.0 in | Wt 189.0 lb

## 2015-06-01 DIAGNOSIS — K219 Gastro-esophageal reflux disease without esophagitis: Secondary | ICD-10-CM

## 2015-06-01 DIAGNOSIS — R059 Cough, unspecified: Secondary | ICD-10-CM

## 2015-06-01 DIAGNOSIS — J189 Pneumonia, unspecified organism: Secondary | ICD-10-CM

## 2015-06-01 DIAGNOSIS — R05 Cough: Secondary | ICD-10-CM

## 2015-06-01 MED ORDER — BENZONATATE 200 MG PO CAPS: 200.0000 mg | ORAL_CAPSULE | Freq: Three times a day (TID) | ORAL | Status: DC | PRN

## 2015-06-01 MED ORDER — FAMOTIDINE 20 MG PO TABS: 20.0000 mg | ORAL_TABLET | Freq: Every day | ORAL | Status: DC

## 2015-06-01 MED ORDER — PANTOPRAZOLE SODIUM 40 MG PO TBEC: 40.0000 mg | DELAYED_RELEASE_TABLET | Freq: Every day | ORAL | Status: DC

## 2015-06-01 NOTE — Progress Notes (Signed)
Subjective:    Patient ID: Destiny Ballard, female    DOB: December 19, 1950, 63 y.o.   MRN: [id number]  HPI 64 year old female never smoker with chronic cough  06/01/2015 acute office visit  : PNA and Cough  Patient presents for an acute office visit. She was recently admitted November 6 through November 8 for community-acquired pneumonia. Was treated with Levaquin transitioned to Augmentin at discharge. Chest x-ray showed a left midlung pneumonia.  Since discharge pt says she continues to have cough and congestion and wheezing .  CXR today shows no acute process with stable BB scarring .  Taking tessalon for cough, does help. Would like refill.  Taking mucinex d .  Has finished antibiotic  Has reflux , takes tums often. Acid comes up into throat .  Patient denies any hemoptysis, orthopnea, PND, increased leg swelling or fever. .    Past Medical History  Diagnosis Date  . Hypertension   . Hyperlipidemia   . Obesity   . Anxiety     Prior suicide attempt  . Depression   . Insulin resistance   . Iron deficiency anemia   . GERD (gastroesophageal reflux disease)   . Cervical spondylosis   . CAD (coronary artery disease)     a) s/p DES to LAD 07/2005 b) Last Myoview low risk 11/2011 showing small fixed apical perfusion defect (prior MI vs attenuation) but no ischemia - normal EF.  . Diabetes mellitus without complication [facility])    Current Outpatient Prescriptions on File Prior to Visit  Medication Sig Dispense Refill  . albuterol (PROVENTIL HFA;VENTOLIN HFA) 108 (90 BASE) MCG/ACT inhaler Inhale 1 puff into the lungs every 6 (six) hours as needed for shortness of breath.    [name] amLODipine (NORVASC) 10 MG tablet TAKE 1 TABLET BY MOUTH EVERY DAY 90 tablet 1  . aspirin EC 81 MG tablet Take 81 mg by mouth daily.    . benzonatate (TESSALON) 100 MG capsule Take 2 capsules (200 mg total) by mouth 2 (two) times daily as needed for cough. 20 capsule 0  . benzonatate (TESSALON) 200 MG capsule Take  1 capsule (200 mg total) by mouth 3 (three) times daily as needed for cough. 30 capsule 0  . bromocriptine (PARLODEL) 2.5 MG tablet Take 0.5 tablets (1.25 mg total) by mouth at bedtime. 45 tablet 3  . cetirizine (ZYRTEC) 10 MG tablet Take 1 tablet (10 mg total) by mouth daily. 30 tablet 11  . Cholecalciferol (VITAMIN D3) 1000 UNITS CAPS Take 1,000 Units by mouth daily.     . CRESTOR 20 MG tablet TAKE 1 TABLET BY MOUTH EVERY DAY 90 tablet 3  . Cyanocobalamin (VITAMIN B 12 PO) Take 500 mg by mouth 2 (two) times daily.    . diphenhydramine-acetaminophen (TYLENOL PM) 25-500 MG TABS Take 1 tablet by mouth at bedtime as needed (for sleep).    . fluticasone (VERAMYST) 27.5 MCG/SPRAY nasal spray Place 2 sprays into the nose daily as needed for rhinitis.     [name] guaiFENesin-codeine 100-10 MG/5ML syrup Take 10 mLs by mouth every 6 (six) hours as needed for cough. 180 mL 0  . hydrOXYzine (ATARAX/VISTARIL) 25 MG tablet Take 0.5-1 tablets (12.5-25 mg total) by mouth at bedtime as needed for itching. PATIENT NEEDS OFFICE VISIT FOR ADDITIONAL REFILLS 30 tablet 0  . Liraglutide (VICTOZA) 18 MG/3ML SOPN Inject 0.3 mLs (1.8 mg total) into the skin daily. And pen needles 1/day 6 mL 11  . metFORMIN (GLUCOPHAGE-XR) 500 MG 24 hr  tablet Take 2 tablets (1,000 mg total) by mouth daily with breakfast. (Patient taking differently: Take 500 mg by mouth 2 (two) times daily. ) 180 tablet 3  . metoprolol succinate (TOPROL-XL) 100 MG 24 hr tablet TAKE 1 TABLET BY MOUTH DAILY WITH BREAKFAST. TAKE WITH OR IMMEDIATELY FOLLOWING A MEAL 90 tablet 0  . nitroGLYCERIN (NITROSTAT) 0.4 MG SL tablet Place 0.4 mg under the tongue every 5 (five) minutes as needed.    [name] omeprazole (PRILOSEC) 40 MG capsule Take 40 mg by mouth daily with breakfast.    . vitamin E 400 UNIT capsule Take 400 Units by mouth daily.    [name] amoxicillin-clavulanate (AUGMENTIN) 875-125 MG tablet Take 1 tablet by mouth every 12 (twelve) hours. For the next 4 days. (Patient not  taking: Reported on 06/01/2015) 8 tablet 0  . fexofenadine (ALLEGRA) 180 MG tablet Take 180 mg by mouth 2 (two) times daily.     [name] FLUoxetine (PROZAC) 40 MG capsule Take 1 capsule (40 mg total) by mouth daily. (Patient not taking: Reported on 06/01/2015) 90 capsule 3  . ibuprofen (ADVIL,MOTRIN) 200 MG tablet Take 800 mg by mouth every 6 (six) hours as needed for moderate pain.      No current facility-administered medications on file prior to visit.    Review of Systems Constitutional:   No  weight loss, night sweats,  Fevers, chills,  +fatigue, or  lassitude.  HEENT:   No headaches,  Difficulty swallowing,  Tooth/dental problems, or  Sore throat,                No sneezing, itching, ear ache,  +nasal congestion, post nasal drip,   CV:  No chest pain,  Orthopnea, PND, swelling in lower extremities, anasarca, dizziness, palpitations, syncope.   GI  No heartburn, indigestion, abdominal pain, nausea, vomiting, diarrhea, change in bowel habits, loss of appetite, bloody stools.   Resp:    No chest wall deformity  Skin: no rash or lesions.  GU: no dysuria, change in color of urine, no urgency or frequency.  No flank pain, no hematuria   MS:  No joint pain or swelling.  No decreased range of motion.  No back pain.  Psych:  No change in mood or affect. No depression or anxiety.  No memory loss.         Objective:   Physical Exam GEN: A/Ox3; pleasant , NAD, well nourished   HEENT:  [place]/AT,  EACs-clear, TMs-wnl, NOSE-clear, THROAT-clear, no lesions, no postnasal drip or exudate noted.   NECK:  Supple w/ fair ROM; no JVD; normal carotid impulses w/o bruits; no thyromegaly or nodules palpated; no lymphadenopathy.  RESP  Clear  P & A; w/o, wheezes/ rales/ or rhonchi.no accessory muscle use, no dullness to percussion  CARD:  RRR, no m/r/g  , no peripheral edema, pulses intact, no cyanosis or clubbing.  GI:   Soft & nt; nml bowel sounds; no organomegaly or masses detected.  Musco:  Warm bil, no deformities or joint swelling noted.   Neuro: alert, no focal deficits noted.    Skin: Warm, no lesions or rashes   CXR 06/01/2015  Reviewed independently  Stable bibasilar scarring is noted. No acute cardiopulmonary abnormality seen.       Assessment & Plan:

## 2015-06-01 NOTE — Patient Instructions (Signed)
Begin Protonix 40mg  daily before meal .  Begin Pepcid 20mg  At bedtime   GERD diet  Change to Mucinex DM Twice daily  As needed   Continue with Tessalon Three times a day  As needed  Cough .  follow up Dr. Lamonte Sakai  In 6 weeks and As needed   Please contact office for sooner follow up if symptoms do not improve or worsen or seek emergency care

## 2015-06-01 NOTE — Addendum Note (Signed)
Addended by: Osa Craver on: 06/01/2015 04:57 PM   Modules accepted: Orders

## 2015-06-01 NOTE — Assessment & Plan Note (Signed)
Upper airway cough ? GERD  cxr w/ no acute process   Plan  Begin Protonix 40mg  daily before meal .  Begin Pepcid 20mg  At bedtime   GERD diet  Change to Mucinex DM Twice daily  As needed   Continue with Tessalon Three times a day  As needed  Cough .  follow up Dr. Lamonte Sakai  In 6 weeks and As needed   Please contact office for sooner follow up if symptoms do not improve or worsen or seek emergency care

## 2015-06-01 NOTE — Assessment & Plan Note (Addendum)
Slow clinical improvement  cxr shows resolved PNA  No further abx at this time .

## 2015-06-01 NOTE — Assessment & Plan Note (Signed)
Begin Protonix 40mg  daily before meal .  Begin Pepcid 20mg  At bedtime   GERD diet  follow up Dr. Lamonte Sakai  In 6 weeks and As needed   Please contact office for sooner follow up if symptoms do not improve or worsen or seek emergency care

## 2015-06-05 DIAGNOSIS — M8580 Other specified disorders of bone density and structure, unspecified site: Secondary | ICD-10-CM | POA: Diagnosis not present

## 2015-06-13 DIAGNOSIS — K529 Noninfective gastroenteritis and colitis, unspecified: Secondary | ICD-10-CM | POA: Diagnosis not present

## 2015-06-13 DIAGNOSIS — K210 Gastro-esophageal reflux disease with esophagitis: Secondary | ICD-10-CM | POA: Diagnosis not present

## 2015-06-13 DIAGNOSIS — K319 Disease of stomach and duodenum, unspecified: Secondary | ICD-10-CM | POA: Diagnosis not present

## 2015-06-13 DIAGNOSIS — R1310 Dysphagia, unspecified: Secondary | ICD-10-CM | POA: Diagnosis not present

## 2015-06-13 DIAGNOSIS — K208 Other esophagitis: Secondary | ICD-10-CM | POA: Diagnosis not present

## 2015-06-13 DIAGNOSIS — R194 Change in bowel habit: Secondary | ICD-10-CM | POA: Diagnosis not present

## 2015-06-13 DIAGNOSIS — K639 Disease of intestine, unspecified: Secondary | ICD-10-CM | POA: Diagnosis not present

## 2015-06-18 ENCOUNTER — Other Ambulatory Visit: Payer: Self-pay | Admitting: Urgent Care

## 2015-06-21 ENCOUNTER — Other Ambulatory Visit: Payer: Self-pay | Admitting: Cardiology

## 2015-07-03 ENCOUNTER — Ambulatory Visit: Payer: Medicare Other | Admitting: Endocrinology

## 2015-07-06 ENCOUNTER — Telehealth: Payer: Self-pay | Admitting: Endocrinology

## 2015-07-06 NOTE — Telephone Encounter (Signed)
Patient no showed today's appt. Please advise on how to follow up. °A. No follow up necessary. °B. Follow up urgent. Contact patient immediately. °C. Follow up necessary. Contact patient and schedule visit in ___ days. °D. Follow up advised. Contact patient and schedule visit in ____weeks. ° °

## 2015-07-07 ENCOUNTER — Telehealth: Payer: Self-pay | Admitting: Family Medicine

## 2015-07-07 NOTE — Telephone Encounter (Signed)
LEFT A MESSAGE FOR PATIENT TO RETURN CALL TO SCHEDULE ANNUAL EXAM

## 2015-07-07 NOTE — Telephone Encounter (Signed)
Reschedule patient appt.

## 2015-07-07 NOTE — Telephone Encounter (Signed)
PATIENT RETURNED [facility] WITH DR Lorelei Pont ON July 19 2015

## 2015-07-09 NOTE — Telephone Encounter (Signed)
Please come back for a follow-up appointment in 6 weeks  

## 2015-07-18 NOTE — Telephone Encounter (Signed)
Patient rescheduled for 07/26/2015.

## 2015-07-19 ENCOUNTER — Ambulatory Visit: Payer: Self-pay | Admitting: Family Medicine

## 2015-07-26 ENCOUNTER — Ambulatory Visit: Payer: Medicare Other | Admitting: Endocrinology

## 2015-07-28 ENCOUNTER — Telehealth: Payer: Self-pay | Admitting: Endocrinology

## 2015-07-28 NOTE — Telephone Encounter (Signed)
please call patient: F/u ov is due 

## 2015-07-31 NOTE — Telephone Encounter (Signed)
Appointment letter mailed to the pt.  

## 2015-08-01 ENCOUNTER — Ambulatory Visit: Payer: Medicare Other | Admitting: Emergency Medicine

## 2015-08-04 ENCOUNTER — Encounter: Payer: Self-pay | Admitting: Family Medicine

## 2015-08-09 ENCOUNTER — Other Ambulatory Visit: Payer: Self-pay | Admitting: Adult Health

## 2015-08-09 ENCOUNTER — Encounter: Payer: Self-pay | Admitting: Family Medicine

## 2015-08-14 DIAGNOSIS — M25512 Pain in left shoulder: Secondary | ICD-10-CM | POA: Diagnosis not present

## 2015-08-14 DIAGNOSIS — M5412 Radiculopathy, cervical region: Secondary | ICD-10-CM | POA: Diagnosis not present

## 2015-08-15 DIAGNOSIS — E119 Type 2 diabetes mellitus without complications: Secondary | ICD-10-CM | POA: Diagnosis not present

## 2015-09-20 ENCOUNTER — Encounter: Payer: Self-pay | Admitting: Family Medicine

## 2015-09-20 ENCOUNTER — Ambulatory Visit (INDEPENDENT_AMBULATORY_CARE_PROVIDER_SITE_OTHER): Payer: Medicare Other | Admitting: Family Medicine

## 2015-09-20 ENCOUNTER — Ambulatory Visit: Payer: No Typology Code available for payment source | Admitting: Family Medicine

## 2015-09-20 VITALS — BP 140/70 | HR 101 | Temp 98.1°F | Ht 60.0 in | Wt 188.6 lb

## 2015-09-20 DIAGNOSIS — Z119 Encounter for screening for infectious and parasitic diseases, unspecified: Secondary | ICD-10-CM

## 2015-09-20 DIAGNOSIS — R05 Cough: Secondary | ICD-10-CM | POA: Diagnosis not present

## 2015-09-20 DIAGNOSIS — H9202 Otalgia, left ear: Secondary | ICD-10-CM | POA: Diagnosis not present

## 2015-09-20 DIAGNOSIS — F329 Major depressive disorder, single episode, unspecified: Secondary | ICD-10-CM | POA: Diagnosis not present

## 2015-09-20 DIAGNOSIS — E118 Type 2 diabetes mellitus with unspecified complications: Secondary | ICD-10-CM

## 2015-09-20 DIAGNOSIS — E1165 Type 2 diabetes mellitus with hyperglycemia: Secondary | ICD-10-CM | POA: Diagnosis not present

## 2015-09-20 DIAGNOSIS — IMO0002 Reserved for concepts with insufficient information to code with codable children: Secondary | ICD-10-CM

## 2015-09-20 DIAGNOSIS — R053 Chronic cough: Secondary | ICD-10-CM

## 2015-09-20 DIAGNOSIS — F32A Depression, unspecified: Secondary | ICD-10-CM

## 2015-09-20 MED ORDER — FLUOXETINE HCL 40 MG PO CAPS: 40.0000 mg | ORAL_CAPSULE | Freq: Every day | ORAL | Status: DC

## 2015-09-20 MED ORDER — BENZONATATE 100 MG PO CAPS: 200.0000 mg | ORAL_CAPSULE | Freq: Two times a day (BID) | ORAL | Status: DC | PRN

## 2015-09-20 NOTE — Progress Notes (Signed)
Pre visit review using our clinic review tool, if applicable. No additional management support is needed unless otherwise documented below in the visit note. 

## 2015-09-20 NOTE — Patient Instructions (Signed)
I will be in touch with your labs If you like I can manage your diabetes  Please see me in about 4 months for a recheck We will request records regarding your mammo/ pap/ bone density

## 2015-09-20 NOTE — Progress Notes (Signed)
[place] at [facility] [address], [place], [place] [address] [phone]  Date:  09/20/2015   Name:  Destiny Ballard   DOB:  02-08-1951   MRN:  [id number]  PCP:  Lamar Blinks, MD    Chief Complaint: Follow-up   History of Present Illness:  Destiny Ballard is a 65 y.o. very pleasant female patient who presents with the following:  Pt with history of hyperlipidemia, OSA, diabetes here today for a follow-up visit.  She would like to see an ENT- she notes that she has ringing in her ears and that her left ear hurts, she tends to lose her balance.  This has been the case for a long time with the ringing and more recently with the balance concern  Her DM is managed by Dr. Loanne Drilling but she would like for me to take over this for her as her control has been ok She is on victoza and also metformin ER. She started on the victoza about 1 month ago.  She is tolerating it ok but is not sure if she is taking the right dose . ..  She had the pen but is not sure about how to dial it up  She uses tessalon for chronic cough and prozac for depression- this does help her mood.  She has had her cough evaluated by pulmonology and no cause was found per her report.  She would like a refill of these medications today  She does see GYN and they did a mammogram for her last year.  - [facility]- they also did a pap and bone density for her Lab Results  Component Value Date   HGBA1C 7.9 05/02/2015     Patient Active Problem List   Diagnosis Date Noted  . Essential hypertension, benign   . HLD (hyperlipidemia)   . Community acquired pneumonia 05/21/2015  . CAP (community acquired pneumonia) 05/21/2015  . Type 2 diabetes mellitus without complication, without long-term current use of insulin ([place])   . Diabetes ([place]) 08/26/2014  . OSA (obstructive sleep apnea) 05/06/2014  . Anorectal abscess 02/17/2014  . Allergic rhinitis 10/14/2013  . Other  and unspecified noninfectious gastroenteritis and colitis(558.9) 10/06/2012  . Dyspnea on exertion 09/02/2012  . Chest discomfort 09/02/2012  . Abscess of axilla 01/05/2012  . Cellulitis of axilla, left 01/05/2012  . Chest pain 12/10/2011  . HYPERLIPIDEMIA 05/11/2010  . MORBID OBESITY 05/11/2010  . HYPERTENSION 05/11/2010  . HEADACHE, CHRONIC 05/11/2010  . COUGH 05/11/2010  . ANEMIA-IRON DEFICIENCY 06/28/2008  . CORONARY ARTERY DISEASE 06/28/2008  . GERD 06/28/2008    Past Medical History  Diagnosis Date  . Hypertension   . Hyperlipidemia   . Obesity   . Anxiety     Prior suicide attempt  . Depression   . Insulin resistance   . Iron deficiency anemia   . GERD (gastroesophageal reflux disease)   . Cervical spondylosis   . CAD (coronary artery disease)     a) s/p DES to LAD 07/2005 b) Last Myoview low risk 11/2011 showing small fixed apical perfusion defect (prior MI vs attenuation) but no ischemia - normal EF.  . Diabetes mellitus without complication [facility])     Past Surgical History  Procedure Laterality Date  . Cardiac catheterization  06/17/2007    NORMAL. EF 60%  . Coronary stent placement  07/2005    LEFT ANTERIOR DESCENDING  . Tubal ligation    . Tonsillectomy and  adenoidectomy    . Childbirth      X3  . Breast enhancement surgery    . Cervical spondylosis      SINGLE LEVEL FUSION  . Incision and drainage breast abscess  01/05/2012       . Forearm fracture surgery  2010    hand and shoulder   . Incision and drainage perirectal abscess N/A 02/18/2014    Procedure: IRRIGATION AND DEBRIDEMENT PERIRECTAL ABSCESS;  Surgeon: Pedro Earls, MD;  Location: [facility];  Service: General;  Laterality: N/A;  . Rotator cuff repair      bilaterla  . Bulging disk      lower part of back    Social History  Substance Use Topics  . Smoking status: Never Smoker   . Smokeless tobacco: Never Used  . Alcohol Use: 0.6 oz/week    1 Glasses of wine per week    Family History   Problem Relation Age of Onset  . Heart attack Mother   . Diabetes Mother   . Lung cancer Mother   . Asthma Mother   . Heart disease Mother   . Suicidality Father     "killed himself"  . Allergies Other     all family--seasonal allergies  . Asthma Daughter     x 2  . Cancer Daughter     pre-cancerous polyp  . Diabetes Sister   . Cancer Sister   . Cervical cancer Daughter     cervical     Allergies  Allergen Reactions  . Prednisone Other (See Comments)    REACTION: mood swings, nightmares. "Shot doesn't bother me, reaction is just with the pill" she states she has had the steroid injections before. From our records methylprednisone was given to her in 2013 without any complications.    Medication list has been reviewed and updated.  Current Outpatient Prescriptions on File Prior to Visit  Medication Sig Dispense Refill  . albuterol (PROVENTIL HFA;VENTOLIN HFA) 108 (90 BASE) MCG/ACT inhaler Inhale 1 puff into the lungs every 6 (six) hours as needed for shortness of breath.    [name] amLODipine (NORVASC) 10 MG tablet TAKE 1 TABLET BY MOUTH EVERY DAY 90 tablet 1  . aspirin EC 81 MG tablet Take 81 mg by mouth daily.    . benzonatate (TESSALON) 100 MG capsule Take 2 capsules (200 mg total) by mouth 2 (two) times daily as needed for cough. 20 capsule 0  . benzonatate (TESSALON) 200 MG capsule Take 1 capsule (200 mg total) by mouth 3 (three) times daily as needed for cough. 90 capsule 0  . bromocriptine (PARLODEL) 2.5 MG tablet Take 0.5 tablets (1.25 mg total) by mouth at bedtime. 45 tablet 3  . cetirizine (ZYRTEC) 10 MG tablet Take 1 tablet (10 mg total) by mouth daily. 30 tablet 11  . Cholecalciferol (VITAMIN D3) 1000 UNITS CAPS Take 1,000 Units by mouth daily.     . CRESTOR 20 MG tablet TAKE 1 TABLET BY MOUTH EVERY DAY 90 tablet 3  . Cyanocobalamin (VITAMIN B 12 PO) Take 500 mg by mouth 2 (two) times daily.    . famotidine (PEPCID) 20 MG tablet Take 1 tablet (20 mg total) by mouth at  bedtime. 30 tablet 3  . fexofenadine (ALLEGRA) 180 MG tablet Take 180 mg by mouth 2 (two) times daily.     [name] FLUoxetine (PROZAC) 40 MG capsule Take 1 capsule (40 mg total) by mouth daily. 90 capsule 3  . fluticasone (VERAMYST) 27.5 MCG/SPRAY  nasal spray Place 2 sprays into the nose daily as needed for rhinitis.     [name] guaiFENesin-codeine 100-10 MG/5ML syrup Take 10 mLs by mouth every 6 (six) hours as needed for cough. 180 mL 0  . hydrOXYzine (ATARAX/VISTARIL) 25 MG tablet TAKE 1/2 TO 1 TABLET AT BEDTIME AS NEEDED FOR ITCHING 90 tablet 1  . ibuprofen (ADVIL,MOTRIN) 200 MG tablet Take 800 mg by mouth every 6 (six) hours as needed for moderate pain.     . Liraglutide (VICTOZA) 18 MG/3ML SOPN Inject 0.3 mLs (1.8 mg total) into the skin daily. And pen needles 1/day 6 mL 11  . metFORMIN (GLUCOPHAGE-XR) 500 MG 24 hr tablet Take 2 tablets (1,000 mg total) by mouth daily with breakfast. (Patient taking differently: Take 500 mg by mouth 2 (two) times daily. ) 180 tablet 3  . metoprolol succinate (TOPROL-XL) 100 MG 24 hr tablet TAKE 1 TABLET BY MOUTH EVERY DAY WITH BREAKFAST OR IMMEDIATELY AFTER A MEAL (NEEDS APPOINTMENT) 30 tablet 0  . nitroGLYCERIN (NITROSTAT) 0.4 MG SL tablet Place 0.4 mg under the tongue every 5 (five) minutes as needed.    . pantoprazole (PROTONIX) 40 MG tablet Take 1 tablet (40 mg total) by mouth daily. 30 tablet 3  . vitamin E 400 UNIT capsule Take 400 Units by mouth daily.    . diphenhydramine-acetaminophen (TYLENOL PM) 25-500 MG TABS Take 1 tablet by mouth at bedtime as needed (for sleep).     No current facility-administered medications on file prior to visit.    Review of Systems:  As per HPI- otherwise negative.   Physical Examination: Filed Vitals:   09/20/15 1555  BP: 140/70  Pulse: 101  Temp: 98.1 F (36.7 C)   Filed Vitals:   09/20/15 1555  Height: 5' (1.524 m)  Weight: 188 lb 9.6 oz (85.548 kg)   Body mass index is 36.83 kg/(m^2). Ideal Body Weight: Weight  in (lb) to have BMI = 25: 127.7  GEN: WDWN, NAD, Non-toxic, A & O x 3, overweight.  Nearly groomed but strong odor of ?cat urine HEENT: Atraumatic, Normocephalic. Neck supple. No masses, No LAD.  Bilateral TM wnl, oropharynx normal.  PEERL,EOMI.   Ears and Nose: No external deformity. CV: RRR, No M/G/R. No JVD. No thrill. No extra heart sounds. PULM: CTA B, no wheezes, crackles, rhonchi. No retractions. No resp. distress. No accessory muscle use. EXTR: No c/c/e NEURO Normal gait.  PSYCH: Normally interactive. Conversant. Not depressed or anxious appearing.  Calm demeanor.    Assessment and Plan: Uncontrolled type 2 diabetes mellitus with complication, without long-term current use of insulin (HCC) - Plan: Hemoglobin A1c  Depression - Plan: FLUoxetine (PROZAC) 40 MG capsule  Chronic cough - Plan: benzonatate (TESSALON) 100 MG capsule  Left ear pain - Plan: Ambulatory referral to ENT  Screening examination for infectious disease - Plan: Hepatitis C antibody  Will check her A1c today- she recently started victoza.  If her A1c continues to look ok I can take her of her DM for her Did ENT referral- ear exam is normal today Refills as above Screen for hep c Follow-up in 4 months   Signed Lamar Blinks, MD

## 2015-09-21 LAB — HEPATITIS C ANTIBODY: HCV Ab: NEGATIVE

## 2015-09-21 LAB — HEMOGLOBIN A1C: Hgb A1c MFr Bld: 8.7 % — ABNORMAL HIGH (ref 4.6–6.5)

## 2015-09-25 ENCOUNTER — Encounter: Payer: Self-pay | Admitting: Family Medicine

## 2015-09-26 DIAGNOSIS — R197 Diarrhea, unspecified: Secondary | ICD-10-CM | POA: Insufficient documentation

## 2015-10-04 DIAGNOSIS — M26623 Arthralgia of bilateral temporomandibular joint: Secondary | ICD-10-CM | POA: Diagnosis not present

## 2015-10-04 DIAGNOSIS — L299 Pruritus, unspecified: Secondary | ICD-10-CM | POA: Diagnosis not present

## 2015-10-04 DIAGNOSIS — R42 Dizziness and giddiness: Secondary | ICD-10-CM | POA: Diagnosis not present

## 2015-10-04 DIAGNOSIS — J342 Deviated nasal septum: Secondary | ICD-10-CM | POA: Diagnosis not present

## 2015-10-04 DIAGNOSIS — H938X3 Other specified disorders of ear, bilateral: Secondary | ICD-10-CM | POA: Diagnosis not present

## 2015-10-04 DIAGNOSIS — H9202 Otalgia, left ear: Secondary | ICD-10-CM | POA: Diagnosis not present

## 2015-10-11 ENCOUNTER — Encounter: Payer: Self-pay | Admitting: Family Medicine

## 2015-10-12 ENCOUNTER — Emergency Department (HOSPITAL_BASED_OUTPATIENT_CLINIC_OR_DEPARTMENT_OTHER)
Admission: EM | Admit: 2015-10-12 | Discharge: 2015-10-12 | Disposition: A | Discharge status: Home / Self Care | Payer: Medicare Other | Attending: Emergency Medicine | Admitting: Emergency Medicine

## 2015-10-12 ENCOUNTER — Encounter (HOSPITAL_BASED_OUTPATIENT_CLINIC_OR_DEPARTMENT_OTHER): Payer: Self-pay | Admitting: Emergency Medicine

## 2015-10-12 DIAGNOSIS — Z955 Presence of coronary angioplasty implant and graft: Secondary | ICD-10-CM | POA: Diagnosis not present

## 2015-10-12 DIAGNOSIS — Z7982 Long term (current) use of aspirin: Secondary | ICD-10-CM | POA: Diagnosis not present

## 2015-10-12 DIAGNOSIS — Z7984 Long term (current) use of oral hypoglycemic drugs: Secondary | ICD-10-CM | POA: Insufficient documentation

## 2015-10-12 DIAGNOSIS — J029 Acute pharyngitis, unspecified: Secondary | ICD-10-CM | POA: Diagnosis not present

## 2015-10-12 DIAGNOSIS — E876 Hypokalemia: Secondary | ICD-10-CM

## 2015-10-12 DIAGNOSIS — R197 Diarrhea, unspecified: Secondary | ICD-10-CM | POA: Diagnosis not present

## 2015-10-12 DIAGNOSIS — Z862 Personal history of diseases of the blood and blood-forming organs and certain disorders involving the immune mechanism: Secondary | ICD-10-CM | POA: Diagnosis not present

## 2015-10-12 DIAGNOSIS — I2510 Atherosclerotic heart disease of native coronary artery without angina pectoris: Secondary | ICD-10-CM | POA: Diagnosis not present

## 2015-10-12 DIAGNOSIS — E8881 Metabolic syndrome: Secondary | ICD-10-CM | POA: Diagnosis not present

## 2015-10-12 DIAGNOSIS — K219 Gastro-esophageal reflux disease without esophagitis: Secondary | ICD-10-CM | POA: Insufficient documentation

## 2015-10-12 DIAGNOSIS — I10 Essential (primary) hypertension: Secondary | ICD-10-CM | POA: Insufficient documentation

## 2015-10-12 DIAGNOSIS — R51 Headache: Secondary | ICD-10-CM | POA: Insufficient documentation

## 2015-10-12 DIAGNOSIS — Z79899 Other long term (current) drug therapy: Secondary | ICD-10-CM | POA: Insufficient documentation

## 2015-10-12 DIAGNOSIS — F329 Major depressive disorder, single episode, unspecified: Secondary | ICD-10-CM | POA: Diagnosis not present

## 2015-10-12 DIAGNOSIS — F419 Anxiety disorder, unspecified: Secondary | ICD-10-CM | POA: Diagnosis not present

## 2015-10-12 DIAGNOSIS — R61 Generalized hyperhidrosis: Secondary | ICD-10-CM | POA: Diagnosis not present

## 2015-10-12 DIAGNOSIS — Z8739 Personal history of other diseases of the musculoskeletal system and connective tissue: Secondary | ICD-10-CM | POA: Insufficient documentation

## 2015-10-12 DIAGNOSIS — E669 Obesity, unspecified: Secondary | ICD-10-CM | POA: Diagnosis not present

## 2015-10-12 DIAGNOSIS — Z9889 Other specified postprocedural states: Secondary | ICD-10-CM | POA: Insufficient documentation

## 2015-10-12 DIAGNOSIS — E119 Type 2 diabetes mellitus without complications: Secondary | ICD-10-CM | POA: Diagnosis not present

## 2015-10-12 DIAGNOSIS — E785 Hyperlipidemia, unspecified: Secondary | ICD-10-CM | POA: Diagnosis not present

## 2015-10-12 DIAGNOSIS — R112 Nausea with vomiting, unspecified: Secondary | ICD-10-CM | POA: Diagnosis present

## 2015-10-12 DIAGNOSIS — N390 Urinary tract infection, site not specified: Secondary | ICD-10-CM | POA: Diagnosis not present

## 2015-10-12 LAB — COMPREHENSIVE METABOLIC PANEL
ALT: 22 U/L (ref 14–54)
AST: 22 U/L (ref 15–41)
Albumin: 3.8 g/dL (ref 3.5–5.0)
Alkaline Phosphatase: 93 U/L (ref 38–126)
Anion gap: 10 (ref 5–15)
BUN: 12 mg/dL (ref 6–20)
CO2: 26 mmol/L (ref 22–32)
Calcium: 9 mg/dL (ref 8.9–10.3)
Chloride: 101 mmol/L (ref 101–111)
Creatinine, Ser: 0.63 mg/dL (ref 0.44–1.00)
GFR calc Af Amer: >60 mL/min (ref >60)
GFR calc non Af Amer: >60 mL/min (ref >60)
Glucose, Bld: 132 mg/dL — ABNORMAL HIGH (ref 65–99)
Potassium: 3.4 mmol/L — ABNORMAL LOW (ref 3.5–5.1)
Sodium: 137 mmol/L (ref 135–145)
Total Bilirubin: 0.5 mg/dL (ref 0.3–1.2)
Total Protein: 7.9 g/dL (ref 6.5–8.1)

## 2015-10-12 LAB — CBC
HCT: 37.7 % (ref 36.0–46.0)
Hemoglobin: 12.3 g/dL (ref 12.0–15.0)
MCH: 26.2 pg (ref 26.0–34.0)
MCHC: 32.6 g/dL (ref 30.0–36.0)
MCV: 80.4 fL (ref 78.0–100.0)
Platelets: 299 10*3/uL (ref 150–400)
RBC: 4.69 MIL/uL (ref 3.87–5.11)
RDW: 14.8 % (ref 11.5–15.5)
WBC: 9.9 10*3/uL (ref 4.0–10.5)

## 2015-10-12 LAB — URINE MICROSCOPIC-ADD ON

## 2015-10-12 LAB — URINALYSIS, ROUTINE W REFLEX MICROSCOPIC
Bilirubin Urine: NEGATIVE
Glucose, UA: NEGATIVE mg/dL
Hgb urine dipstick: NEGATIVE
Ketones, ur: NEGATIVE mg/dL
Nitrite: NEGATIVE
Protein, ur: NEGATIVE mg/dL
Specific Gravity, Urine: 1.02 (ref 1.005–1.030)
pH: 5 (ref 5.0–8.0)

## 2015-10-12 LAB — LIPASE, BLOOD: Lipase: 30 U/L (ref 11–51)

## 2015-10-12 MED ORDER — SODIUM CHLORIDE 0.9 % IV BOLUS (SEPSIS)
500.0000 mL | Freq: Once | INTRAVENOUS | Status: AC
  Administered 2015-10-12: 500 mL via INTRAVENOUS

## 2015-10-12 MED ORDER — METOCLOPRAMIDE HCL 5 MG/ML IJ SOLN
10.0000 mg | Freq: Once | INTRAMUSCULAR | Status: AC
  Administered 2015-10-12: 10 mg via INTRAVENOUS
  Filled 2015-10-12: qty 2

## 2015-10-12 MED ORDER — ONDANSETRON 8 MG PO TBDP: 8.0000 mg | ORAL_TABLET | Freq: Three times a day (TID) | ORAL | Status: DC | PRN

## 2015-10-12 MED ORDER — ONDANSETRON HCL 4 MG/2ML IJ SOLN
4.0000 mg | Freq: Once | INTRAMUSCULAR | Status: AC
  Administered 2015-10-12: 4 mg via INTRAVENOUS
  Filled 2015-10-12: qty 2

## 2015-10-12 MED ORDER — SODIUM CHLORIDE 0.9 % IV BOLUS (SEPSIS)
1000.0000 mL | Freq: Once | INTRAVENOUS | Status: AC
  Administered 2015-10-12: 1000 mL via INTRAVENOUS

## 2015-10-12 MED ORDER — POTASSIUM CHLORIDE CRYS ER 20 MEQ PO TBCR
40.0000 meq | EXTENDED_RELEASE_TABLET | Freq: Once | ORAL | Status: AC
  Administered 2015-10-12: 40 meq via ORAL
  Filled 2015-10-12: qty 2

## 2015-10-12 MED ORDER — DEXTROSE 5 % IV SOLN
1.0000 g | Freq: Once | INTRAVENOUS | Status: AC
  Administered 2015-10-12: 1 g via INTRAVENOUS
  Filled 2015-10-12: qty 10

## 2015-10-12 MED ORDER — CEPHALEXIN 500 MG PO CAPS: 500.0000 mg | ORAL_CAPSULE | Freq: Four times a day (QID) | ORAL | Status: DC

## 2015-10-12 NOTE — ED Notes (Signed)
MD at bedside discussing test results and dispo plan of care. 

## 2015-10-12 NOTE — ED Notes (Signed)
MD at bedside. 

## 2015-10-12 NOTE — ED Notes (Signed)
Patient reports that she has had vomiting since Saturday. Reports that every time she takes her insulin she gets sick and gets a HA. She states that she feels like it is the insulin that is making her sick, but reports that her dr feels that it is her pancrease. The patient reports that she is having pain to her left upper quadrant intermittently.

## 2015-10-12 NOTE — ED Notes (Signed)
Pt has been drinking ice water.  Coke given at this time.

## 2015-10-12 NOTE — ED Provider Notes (Signed)
CSN: [id number]     Arrival date & time 10/12/15  1931 History  By signing my name below, I, Destiny Ballard, attest that this documentation has been prepared under the direction and in the presence of Destiny Saver, MD. Electronically Signed: Jolayne Ballard, [place]. 10/12/2015. 8:13 PM.  Chief Complaint  Patient presents with  . Vomiting   The history is provided by the patient. No language interpreter was used.   HPI Comments: Destiny Ballard is a 65 y.o. female, with a PMHx of HTN, insulin resistance, CAD, and DM, who presents to the Emergency Department complaining of sudden onset of vomiting and associated diaphoresis, which began five days ago, after she took her victoza injection, but resolved by the next morning. Pt did not take her victoza injection again until this morning, which again caused the onset of nausea, vomiting, and intermittent diarrhea. She also reports associated, mild sore throat and epigastric abdominal pain, and notes that her vomit is consistent with whatever food she eats prior to the episodes of emesis. She is not exactly sure when she began taking the victoza, but her husband notes that she has been taking it  over one month. Pt reports that when she began taking it, it was not a high enough dosage until they increased the amount to 1.8 daily.  Pt denies associated onset of cough, chest pain, SOB, difficulty urinating, dysuria, and hx of prior abdominal surgery. No distension. No bilious or bloody emesis.      Past Medical History  Diagnosis Date  . Hypertension   . Hyperlipidemia   . Obesity   . Anxiety     Prior suicide attempt  . Depression   . Insulin resistance   . Iron deficiency anemia   . GERD (gastroesophageal reflux disease)   . Cervical spondylosis   . CAD (coronary artery disease)     a) s/p DES to LAD 07/2005 b) Last Myoview low risk 11/2011 showing small fixed apical perfusion defect (prior MI vs attenuation) but no ischemia - normal  EF.  . Diabetes mellitus without complication [facility])    Past Surgical History  Procedure Laterality Date  . Cardiac catheterization  06/17/2007    NORMAL. EF 60%  . Coronary stent placement  07/2005    LEFT ANTERIOR DESCENDING  . Tubal ligation    . Tonsillectomy and adenoidectomy    . Childbirth      X3  . Breast enhancement surgery    . Cervical spondylosis      SINGLE LEVEL FUSION  . Incision and drainage breast abscess  01/05/2012       . Forearm fracture surgery  2010    hand and shoulder   . Incision and drainage perirectal abscess N/A 02/18/2014    Procedure: IRRIGATION AND DEBRIDEMENT PERIRECTAL ABSCESS;  Surgeon: Pedro Earls, MD;  Location: [facility];  Service: General;  Laterality: N/A;  . Rotator cuff repair      bilaterla  . Bulging disk      lower part of back   Family History  Problem Relation Age of Onset  . Heart attack Mother   . Diabetes Mother   . Lung cancer Mother   . Asthma Mother   . Heart disease Mother   . Suicidality Father     "killed himself"  . Allergies Other     all family--seasonal allergies  . Asthma Daughter     x 2  . Cancer Daughter     pre-cancerous  polyp  . Diabetes Sister   . Cancer Sister   . Cervical cancer Daughter     cervical    Social History  Substance Use Topics  . Smoking status: Never Smoker   . Smokeless tobacco: Never Used  . Alcohol Use: 0.6 oz/week    1 Glasses of wine per week   OB History    No data available     Review of Systems  Constitutional: Positive for diaphoresis.  HENT: Positive for sore throat.   Eyes: Negative for visual disturbance.  Respiratory: Negative for cough and shortness of breath.   Cardiovascular: Negative for chest pain.  Gastrointestinal: Positive for nausea, vomiting, abdominal pain and diarrhea.  Endocrine: Negative for polyuria.  Genitourinary: Negative for dysuria and difficulty urinating.  Musculoskeletal: Negative for neck pain and neck stiffness.  Skin: Negative for  rash.  Neurological: Positive for headaches. Negative for weakness and numbness.  Hematological: Negative for adenopathy.  Psychiatric/Behavioral: Negative for confusion.  All other systems reviewed and are negative.  Allergies  Prednisone  Home Medications   Prior to Admission medications   Medication Sig Start Date End Date Taking? Authorizing Provider  albuterol (PROVENTIL HFA;VENTOLIN HFA) 108 (90 BASE) MCG/ACT inhaler Inhale 1 puff into the lungs every 6 (six) hours as needed for shortness of breath.    Historical Provider, MD  amLODipine (NORVASC) 10 MG tablet TAKE 1 TABLET BY MOUTH EVERY DAY 09/21/14   Larey Dresser, MD  aspirin EC 81 MG tablet Take 81 mg by mouth daily.    Historical Provider, MD  benzonatate (TESSALON) 100 MG capsule Take 2 capsules (200 mg total) by mouth 2 (two) times daily as needed for cough. 09/20/15   Gay Filler Copland, MD  benzonatate (TESSALON) 200 MG capsule Take 1 capsule (200 mg total) by mouth 3 (three) times daily as needed for cough. 06/01/15   Tammy S Parrett, NP  bromocriptine (PARLODEL) 2.5 MG tablet Take 0.5 tablets (1.25 mg total) by mouth at bedtime. 11/14/14   Renato Shin, MD  cetirizine (ZYRTEC) 10 MG tablet Take 1 tablet (10 mg total) by mouth daily. 11/13/14   Tishira R Brewington, PA-C  Cholecalciferol (VITAMIN D3) 1000 UNITS CAPS Take 1,000 Units by mouth daily.     Historical Provider, MD  CRESTOR 20 MG tablet TAKE 1 TABLET BY MOUTH EVERY DAY 11/16/14   Burtis Junes, NP  Cyanocobalamin (VITAMIN B 12 PO) Take 500 mg by mouth 2 (two) times daily.    Historical Provider, MD  diphenhydramine-acetaminophen (TYLENOL PM) 25-500 MG TABS Take 1 tablet by mouth at bedtime as needed (for sleep).    Historical Provider, MD  famotidine (PEPCID) 20 MG tablet Take 1 tablet (20 mg total) by mouth at bedtime. 06/01/15   Tammy S Parrett, NP  fexofenadine (ALLEGRA) 180 MG tablet Take 180 mg by mouth 2 (two) times daily.     Historical Provider, MD  FLUoxetine  (PROZAC) 40 MG capsule Take 1 capsule (40 mg total) by mouth daily. 09/20/15   Gay Filler Copland, MD  fluticasone (VERAMYST) 27.5 MCG/SPRAY nasal spray Place 2 sprays into the nose daily as needed for rhinitis.     Historical Provider, MD  guaiFENesin-codeine 100-10 MG/5ML syrup Take 10 mLs by mouth every 6 (six) hours as needed for cough. 05/23/15   Sela Hua, MD  hydrOXYzine (ATARAX/VISTARIL) 25 MG tablet TAKE 1/2 TO 1 TABLET AT BEDTIME AS NEEDED FOR ITCHING 06/20/15   Thao P Le, DO  ibuprofen (ADVIL,MOTRIN)  200 MG tablet Take 800 mg by mouth every 6 (six) hours as needed for moderate pain.     Historical Provider, MD  Liraglutide (VICTOZA) 18 MG/3ML SOPN Inject 0.3 mLs (1.8 mg total) into the skin daily. And pen needles 1/day 05/02/15   Renato Shin, MD  metFORMIN (GLUCOPHAGE-XR) 500 MG 24 hr tablet Take 2 tablets (1,000 mg total) by mouth daily with breakfast. Patient taking differently: Take 500 mg by mouth 2 (two) times daily.  05/02/15   Renato Shin, MD  metoprolol succinate (TOPROL-XL) 100 MG 24 hr tablet TAKE 1 TABLET BY MOUTH EVERY DAY WITH BREAKFAST OR IMMEDIATELY AFTER A MEAL (NEEDS APPOINTMENT) 06/21/15   Larey Dresser, MD  nitroGLYCERIN (NITROSTAT) 0.4 MG SL tablet Place 0.4 mg under the tongue every 5 (five) minutes as needed. 12/11/11   Dayna N Dunn, PA-C  pantoprazole (PROTONIX) 40 MG tablet Take 1 tablet (40 mg total) by mouth daily. 06/01/15 05/31/16  Tammy S Parrett, NP  vitamin E 400 UNIT capsule Take 400 Units by mouth daily.    Historical Provider, MD   BP 165/97 mmHg  Pulse 91  Temp(Src) 98.6 F (37 C) (Oral)  Resp 18  Ht 5' (1.524 m)  Wt 180 lb (81.647 kg)  BMI 35.15 kg/m2  SpO2 95% Physical Exam  Constitutional: She is oriented to person, place, and time. She appears well-developed and well-nourished. No distress.  HENT:  Head: Normocephalic and atraumatic.  Nose: Nose normal.  Mouth/Throat: Oropharynx is clear and moist.  Eyes: Conjunctivae are normal. Pupils  are equal, round, and reactive to light. No scleral icterus.  Neck: Normal range of motion. Neck supple. No tracheal deviation present.  Cardiovascular: Normal rate, regular rhythm, normal heart sounds and intact distal pulses.  Exam reveals no gallop and no friction rub.   No murmur heard. Pulmonary/Chest: Effort normal and breath sounds normal. No respiratory distress. She has no wheezes.  Abdominal: Soft. Bowel sounds are normal. She exhibits no distension and no mass. There is no tenderness. There is no rebound and no guarding.  Genitourinary:  No cva tenderness  Musculoskeletal: She exhibits no edema.  Neurological: She is alert and oriented to person, place, and time.  Skin: Skin is warm and dry. No rash noted.  Psychiatric: She has a normal mood and affect.  Nursing note and vitals reviewed.  ED Course  Procedures  DIAGNOSTIC STUDIES:    Oxygen Saturation is 95% on RA, adequate by my interpretation.   COORDINATION OF CARE:  7:51 PM Will order labwork and will administer Zofran and Sodium chloride 0.9% bolus in the ED.  Discussed treatment plan with pt at bedside and pt agreed to plan.   Labs Review   Results for orders placed or performed during the hospital encounter of 10/12/15  CBC  Result Value Ref Range   WBC 9.9 4.0 - 10.5 K/uL   RBC 4.69 3.87 - 5.11 MIL/uL   Hemoglobin 12.3 12.0 - 15.0 g/dL   HCT 37.7 36.0 - 46.0 %   MCV 80.4 78.0 - 100.0 fL   MCH 26.2 26.0 - 34.0 pg   MCHC 32.6 30.0 - 36.0 g/dL   RDW 14.8 11.5 - 15.5 %   Platelets 299 150 - 400 K/uL  Comprehensive metabolic panel  Result Value Ref Range   Sodium 137 135 - 145 mmol/L   Potassium 3.4 (L) 3.5 - 5.1 mmol/L   Chloride 101 101 - 111 mmol/L   CO2 26 22 - 32 mmol/L  Glucose, Bld 132 (H) 65 - 99 mg/dL   BUN 12 6 - 20 mg/dL   Creatinine, Ser 0.63 0.44 - 1.00 mg/dL   Calcium 9.0 8.9 - 10.3 mg/dL   Total Protein 7.9 6.5 - 8.1 g/dL   Albumin 3.8 3.5 - 5.0 g/dL   AST 22 15 - 41 U/L   ALT 22 14 -  54 U/L   Alkaline Phosphatase 93 38 - 126 U/L   Total Bilirubin 0.5 0.3 - 1.2 mg/dL   GFR calc non Af Amer >60 >60 mL/min   GFR calc Af Amer >60 >60 mL/min   Anion gap 10 5 - 15  Lipase, blood  Result Value Ref Range   Lipase 30 11 - 51 U/L  Urinalysis, Routine w reflex microscopic (not at [facility])  Result Value Ref Range   Color, Urine YELLOW YELLOW   APPearance CLEAR (A) CLEAR   Specific Gravity, Urine 1.020 1.005 - 1.030   pH 5.0 5.0 - 8.0   Glucose, UA NEGATIVE NEGATIVE mg/dL   Hgb urine dipstick NEGATIVE NEGATIVE   Bilirubin Urine NEGATIVE NEGATIVE   Ketones, ur NEGATIVE NEGATIVE mg/dL   Protein, ur NEGATIVE NEGATIVE mg/dL   Nitrite NEGATIVE NEGATIVE   Leukocytes, UA SMALL (A) NEGATIVE  Urine microscopic-add on  Result Value Ref Range   Squamous Epithelial / LPF 0-5 (A) NONE SEEN   WBC, UA 6-30 0 - 5 WBC/hpf   RBC / HPF 0-5 0 - 5 RBC/hpf   Bacteria, UA FEW (A) NONE SEEN   Urine-Other MUCOUS PRESENT       I have personally reviewed and evaluated these lab results as part of my medical decision-making.  MDM   I personally performed the services described in this documentation, which was scribed in my presence. The recorded information has been reviewed and considered. Destiny Saver, MD  Iv ns bolus. zofran iv.  Labs.   Additional ns iv.  kcl po. Po fluids.  Possible uti on labs, u cx sent. Rocephin iv.  Recheck abd soft nt.  Reviewed nursing notes and prior charts for additional history.       Destiny Saver, MD 10/12/15 2136

## 2015-10-12 NOTE — Discharge Instructions (Signed)
It was our pleasure to provide your ER care today - we hope that you feel better.  Rest. Drink plenty of fluids.  Take zofran as need for nausea.   The lab tests show a possible urine infection - take antibiotic as prescribed. A urine culture was sent the results of which will be back in 2-3 days - have your doctor follow up on the result then.   Also from today's labs, your potassium level is slightly low (3.4)  - eat plenty of fruits and vegetables, and follow up with your doctor in the next 1-2 weeks.  It is possible your symptoms may be related to the recent increased dose of your medication - contact your doctor tomorrow to discuss your medications/doses.  Return to ER if worse, new symptoms, fevers, persistent vomiting, severe abdominal pain, other concern.     Nausea and Vomiting Nausea is a sick feeling that often comes before throwing up (vomiting). Vomiting is a reflex where stomach contents come out of your mouth. Vomiting can cause severe loss of body fluids (dehydration). Children and elderly adults can become dehydrated quickly, especially if they also have diarrhea. Nausea and vomiting are symptoms of a condition or disease. It is important to find the cause of your symptoms. CAUSES   Direct irritation of the stomach lining. This irritation can result from increased acid production (gastroesophageal reflux disease), infection, food poisoning, taking certain medicines (such as nonsteroidal anti-inflammatory drugs), alcohol use, or tobacco use.  Signals from the brain.These signals could be caused by a headache, heat exposure, an inner ear disturbance, increased pressure in the brain from injury, infection, a tumor, or a concussion, pain, emotional stimulus, or metabolic problems.  An obstruction in the gastrointestinal tract (bowel obstruction).  Illnesses such as diabetes, hepatitis, gallbladder problems, appendicitis, kidney problems, cancer, sepsis, atypical symptoms of a  heart attack, or eating disorders.  Medical treatments such as chemotherapy and radiation.  Receiving medicine that makes you sleep (general anesthetic) during surgery. DIAGNOSIS Your caregiver may ask for tests to be done if the problems do not improve after a few days. Tests may also be done if symptoms are severe or if the reason for the nausea and vomiting is not clear. Tests may include:  Urine tests.  Blood tests.  Stool tests.  Cultures (to look for evidence of infection).  X-rays or other imaging studies. Test results can help your caregiver make decisions about treatment or the need for additional tests. TREATMENT You need to stay well hydrated. Drink frequently but in small amounts.You may wish to drink water, sports drinks, clear broth, or eat frozen ice pops or gelatin dessert to help stay hydrated.When you eat, eating slowly may help prevent nausea.There are also some antinausea medicines that may help prevent nausea. HOME CARE INSTRUCTIONS   Take all medicine as directed by your caregiver.  If you do not have an appetite, do not force yourself to eat. However, you must continue to drink fluids.  If you have an appetite, eat a normal diet unless your caregiver tells you differently.  Eat a variety of complex carbohydrates (rice, wheat, potatoes, bread), lean meats, yogurt, fruits, and vegetables.  Avoid high-fat foods because they are more difficult to digest.  Drink enough water and fluids to keep your urine clear or pale yellow.  If you are dehydrated, ask your caregiver for specific rehydration instructions. Signs of dehydration may include:  Severe thirst.  Dry lips and mouth.  Dizziness.  Dark urine.  Decreasing urine frequency and amount.  Confusion.  Rapid breathing or pulse. SEEK IMMEDIATE MEDICAL CARE IF:   You have blood or brown flecks (like coffee grounds) in your vomit.  You have black or bloody stools.  You have a severe headache or  stiff neck.  You are confused.  You have severe abdominal pain.  You have chest pain or trouble breathing.  You do not urinate at least once every 8 hours.  You develop cold or clammy skin.  You continue to vomit for longer than 24 to 48 hours.  You have a fever. MAKE SURE YOU:   Understand these instructions.  Will watch your condition.  Will get help right away if you are not doing well or get worse.   This information is not intended to replace advice given to you by your health care provider. Make sure you discuss any questions you have with your health care provider.   Document Released: 07/01/2005 Document Revised: 09/23/2011 Document Reviewed: 11/28/2010 Elsevier Interactive Patient Education 2016 [employer].    Hypokalemia Hypokalemia means that the amount of potassium in the blood is lower than normal.Potassium is a chemical, called an electrolyte, that helps regulate the amount of fluid in the body. It also stimulates muscle contraction and helps nerves function properly.Most of the body's potassium is inside of cells, and only a very small amount is in the blood. Because the amount in the blood is so small, minor changes can be life-threatening. CAUSES  Antibiotics.  Diarrhea or vomiting.  Using laxatives too much, which can cause diarrhea.  Chronic kidney disease.  Water pills (diuretics).  Eating disorders (bulimia).  Low magnesium level.  Sweating a lot. SIGNS AND SYMPTOMS  Weakness.  Constipation.  Fatigue.  Muscle cramps.  Mental confusion.  Skipped heartbeats or irregular heartbeat (palpitations).  Tingling or numbness. DIAGNOSIS  Your health care provider can diagnose hypokalemia with blood tests. In addition to checking your potassium level, your health care provider may also check other lab tests. TREATMENT Hypokalemia can be treated with potassium supplements taken by mouth or adjustments in your current medicines. If your  potassium level is very low, you may need to get potassium through a vein (IV) and be monitored in the hospital. A diet high in potassium is also helpful. Foods high in potassium are:  Nuts, such as peanuts and pistachios.  Seeds, such as sunflower seeds and pumpkin seeds.  Peas, lentils, and lima beans.  Whole grain and bran cereals and breads.  Fresh fruit and vegetables, such as apricots, avocado, bananas, cantaloupe, kiwi, oranges, tomatoes, asparagus, and potatoes.  Orange and tomato juices.  Red meats.  Fruit yogurt. HOME CARE INSTRUCTIONS  Take all medicines as prescribed by your health care provider.  Maintain a healthy diet by including nutritious food, such as fruits, vegetables, nuts, whole grains, and lean meats.  If you are taking a laxative, be sure to follow the directions on the label. SEEK MEDICAL CARE IF:  Your weakness gets worse.  You feel your heart pounding or racing.  You are vomiting or having diarrhea.  You are diabetic and having trouble keeping your blood glucose in the normal range. SEEK IMMEDIATE MEDICAL CARE IF:  You have chest pain, shortness of breath, or dizziness.  You are vomiting or having diarrhea for more than 2 days.  You faint. MAKE SURE YOU:   Understand these instructions.  Will watch your condition.  Will get help right away if you are not doing well  or get worse.   This information is not intended to replace advice given to you by your health care provider. Make sure you discuss any questions you have with your health care provider.   Document Released: 07/01/2005 Document Revised: 07/22/2014 Document Reviewed: 01/01/2013 Elsevier Interactive Patient Education [employer].

## 2015-10-14 LAB — URINE CULTURE

## 2015-10-15 NOTE — Progress Notes (Signed)
Subjective:    Patient ID: Destiny Ballard, female    DOB: 1950/09/29, 65 y.o.   MRN: [id number]  HPI Pt returns for f/u of diabetes mellitus: DM type: 2 Dx'ed: 0000000 Complications: none Therapy: 2 oral meds+victoza GDM: never DKA: never Severe hypoglycemia: never.   Pancreatitis: never Other: she has never been on insulin; she could not afford [place].   Interval history: She says the victoza is causing vomiting.  She stopped it, with resolution of sxs.  no cbg record, but states cbg's are in the 200's.   Past Medical History  Diagnosis Date  . Hypertension   . Hyperlipidemia   . Obesity   . Anxiety     Prior suicide attempt  . Depression   . Insulin resistance   . Iron deficiency anemia   . GERD (gastroesophageal reflux disease)   . Cervical spondylosis   . CAD (coronary artery disease)     a) s/p DES to LAD 07/2005 b) Last Myoview low risk 11/2011 showing small fixed apical perfusion defect (prior MI vs attenuation) but no ischemia - normal EF.  . Diabetes mellitus without complication [facility])     Past Surgical History  Procedure Laterality Date  . Cardiac catheterization  06/17/2007    NORMAL. EF 60%  . Coronary stent placement  07/2005    LEFT ANTERIOR DESCENDING  . Tubal ligation    . Tonsillectomy and adenoidectomy    . Childbirth      X3  . Breast enhancement surgery    . Cervical spondylosis      SINGLE LEVEL FUSION  . Incision and drainage breast abscess  01/05/2012       . Forearm fracture surgery  2010    hand and shoulder   . Incision and drainage perirectal abscess N/A 02/18/2014    Procedure: IRRIGATION AND DEBRIDEMENT PERIRECTAL ABSCESS;  Surgeon: Pedro Earls, MD;  Location: [facility] ORS;  Service: General;  Laterality: N/A;  . Rotator cuff repair      bilaterla  . Bulging disk      lower part of back    Social History   Social History  . Marital Status: Married    Spouse Name: Lake Bells  . Number of Children: 3  . Years of Education: 12    Occupational History  . retired from [employer]     11/2012   Social History Main Topics  . Smoking status: Never Smoker   . Smokeless tobacco: Never Used  . Alcohol Use: 0.6 oz/week    1 Glasses of wine per week  . Drug Use: No  . Sexual Activity:    Partners: Male    Birth Control/ Protection: None   Other Topics Concern  . Not on file   Social History Narrative   Lives with her husband.  Their eldest daughter lives upstairs.    Current Outpatient Prescriptions on File Prior to Visit  Medication Sig Dispense Refill  . albuterol (PROVENTIL HFA;VENTOLIN HFA) 108 (90 BASE) MCG/ACT inhaler Inhale 1 puff into the lungs every 6 (six) hours as needed for shortness of breath.    [name] amLODipine (NORVASC) 10 MG tablet TAKE 1 TABLET BY MOUTH EVERY DAY 90 tablet 1  . aspirin EC 81 MG tablet Take 81 mg by mouth daily.    . bromocriptine (PARLODEL) 2.5 MG tablet Take 0.5 tablets (1.25 mg total) by mouth at bedtime. 45 tablet 3  . cetirizine (ZYRTEC) 10 MG tablet Take 1 tablet (10 mg total) by  mouth daily. 30 tablet 11  . Cholecalciferol (VITAMIN D3) 1000 UNITS CAPS Take 1,000 Units by mouth daily.     . CRESTOR 20 MG tablet TAKE 1 TABLET BY MOUTH EVERY DAY 90 tablet 3  . Cyanocobalamin (VITAMIN B 12 PO) Take 500 mg by mouth 2 (two) times daily.    . diphenhydramine-acetaminophen (TYLENOL PM) 25-500 MG TABS Take 1 tablet by mouth at bedtime as needed (for sleep).    . famotidine (PEPCID) 20 MG tablet Take 1 tablet (20 mg total) by mouth at bedtime. 30 tablet 3  . fexofenadine (ALLEGRA) 180 MG tablet Take 180 mg by mouth 2 (two) times daily.     [name] FLUoxetine (PROZAC) 40 MG capsule Take 1 capsule (40 mg total) by mouth daily. 90 capsule 3  . fluticasone (VERAMYST) 27.5 MCG/SPRAY nasal spray Place 2 sprays into the nose daily as needed for rhinitis.     [name] guaiFENesin-codeine 100-10 MG/5ML syrup Take 10 mLs by mouth every 6 (six) hours as needed for cough. 180 mL 0  . hydrOXYzine (ATARAX/VISTARIL) 25  MG tablet TAKE 1/2 TO 1 TABLET AT BEDTIME AS NEEDED FOR ITCHING 90 tablet 1  . ibuprofen (ADVIL,MOTRIN) 200 MG tablet Take 800 mg by mouth every 6 (six) hours as needed for moderate pain.     . metFORMIN (GLUCOPHAGE-XR) 500 MG 24 hr tablet Take 2 tablets (1,000 mg total) by mouth daily with breakfast. (Patient taking differently: Take 500 mg by mouth 2 (two) times daily. ) 180 tablet 3  . metoprolol succinate (TOPROL-XL) 100 MG 24 hr tablet TAKE 1 TABLET BY MOUTH EVERY DAY WITH BREAKFAST OR IMMEDIATELY AFTER A MEAL (NEEDS APPOINTMENT) 30 tablet 0  . nitroGLYCERIN (NITROSTAT) 0.4 MG SL tablet Place 0.4 mg under the tongue every 5 (five) minutes as needed.    . pantoprazole (PROTONIX) 40 MG tablet Take 1 tablet (40 mg total) by mouth daily. 30 tablet 3  . vitamin E 400 UNIT capsule Take 400 Units by mouth daily.     No current facility-administered medications on file prior to visit.    Allergies  Allergen Reactions  . Prednisone Other (See Comments)    REACTION: mood swings, nightmares. "Shot doesn't bother me, reaction is just with the pill" she states she has had the steroid injections before. From our records methylprednisone was given to her in 2013 without any complications.    Family History  Problem Relation Age of Onset  . Heart attack Mother   . Diabetes Mother   . Lung cancer Mother   . Asthma Mother   . Heart disease Mother   . Suicidality Father     "killed himself"  . Allergies Other     all family--seasonal allergies  . Asthma Daughter     x 2  . Cancer Daughter     pre-cancerous polyp  . Diabetes Sister   . Cancer Sister   . Cervical cancer Daughter     cervical     BP 136/82 mmHg  Pulse 87  Temp(Src) 98.1 F (36.7 C) (Oral)  Ht 5' (1.524 m)  Wt 187 lb (84.823 kg)  BMI 36.52 kg/m2  SpO2 91%  Review of Systems She denies hypoglycemia    Objective:   Physical Exam VITAL SIGNS:  See vs page GENERAL: no distress Pulses: dorsalis pedis intact bilat.    MSK: no deformity of the feet CV: no leg edema Skin:  no ulcer on the feet.  normal color and temp on the  feet. Neuro: sensation is intact to touch on the feet.    Lab Results  Component Value Date   HGBA1C 8.7* 09/20/2015      Assessment & Plan:  DM: she needs increased rx.  Vomiting, new, due to victoza.   Patient is advised the following: Patient Instructions  Please reduce the victoza to 0.6 mg daily. i have sent a prescription to your pharmacy, to add "repaglinide." check your blood sugar once a day.  vary the time of day when you check, between before the 3 meals, and at bedtime.  also check if you have symptoms of your blood sugar being too high or too low.  please keep a record of the readings and bring it to your next appointment here (or you can bring the meter itself).  You can write it on any piece of paper.  please call us sooner if your blood sugar goes below 70, or if you have a lot of readings over 200. Please come back for a follow-up appointment in 6 weeks.

## 2015-10-15 NOTE — Patient Instructions (Addendum)
Please reduce the victoza to 0.6 mg daily. i have sent a prescription to your pharmacy, to add "repaglinide." check your blood sugar once a day.  vary the time of day when you check, between before the 3 meals, and at bedtime.  also check if you have symptoms of your blood sugar being too high or too low.  please keep a record of the readings and bring it to your next appointment here (or you can bring the meter itself).  You can write it on any piece of paper.  please call us sooner if your blood sugar goes below 70, or if you have a lot of readings over 200. Please come back for a follow-up appointment in 6 weeks.

## 2015-10-17 ENCOUNTER — Ambulatory Visit (INDEPENDENT_AMBULATORY_CARE_PROVIDER_SITE_OTHER): Payer: Medicare Other | Admitting: Endocrinology

## 2015-10-17 ENCOUNTER — Encounter: Payer: Self-pay | Admitting: Endocrinology

## 2015-10-17 VITALS — BP 136/82 | HR 87 | Temp 98.1°F | Ht 60.0 in | Wt 187.0 lb

## 2015-10-17 DIAGNOSIS — E1165 Type 2 diabetes mellitus with hyperglycemia: Secondary | ICD-10-CM | POA: Diagnosis not present

## 2015-10-17 MED ORDER — REPAGLINIDE 2 MG PO TABS: 2.0000 mg | ORAL_TABLET | Freq: Three times a day (TID) | ORAL | Status: DC

## 2015-10-17 MED ORDER — LIRAGLUTIDE 18 MG/3ML ~~LOC~~ SOPN: 0.6000 mg | PEN_INJECTOR | Freq: Every day | SUBCUTANEOUS | Status: DC

## 2015-11-28 ENCOUNTER — Ambulatory Visit: Payer: Medicare Other | Admitting: Endocrinology

## 2015-12-07 ENCOUNTER — Encounter (HOSPITAL_BASED_OUTPATIENT_CLINIC_OR_DEPARTMENT_OTHER): Payer: Self-pay

## 2015-12-07 ENCOUNTER — Emergency Department (HOSPITAL_BASED_OUTPATIENT_CLINIC_OR_DEPARTMENT_OTHER)
Admission: EM | Admit: 2015-12-07 | Discharge: 2015-12-07 | Disposition: A | Discharge status: Home / Self Care | Payer: Medicare Other | Attending: Emergency Medicine | Admitting: Emergency Medicine

## 2015-12-07 DIAGNOSIS — E119 Type 2 diabetes mellitus without complications: Secondary | ICD-10-CM | POA: Diagnosis not present

## 2015-12-07 DIAGNOSIS — E669 Obesity, unspecified: Secondary | ICD-10-CM | POA: Diagnosis not present

## 2015-12-07 DIAGNOSIS — Z79899 Other long term (current) drug therapy: Secondary | ICD-10-CM | POA: Diagnosis not present

## 2015-12-07 DIAGNOSIS — Z6835 Body mass index (BMI) 35.0-35.9, adult: Secondary | ICD-10-CM | POA: Diagnosis not present

## 2015-12-07 DIAGNOSIS — Y939 Activity, unspecified: Secondary | ICD-10-CM | POA: Insufficient documentation

## 2015-12-07 DIAGNOSIS — Y999 Unspecified external cause status: Secondary | ICD-10-CM | POA: Insufficient documentation

## 2015-12-07 DIAGNOSIS — W57XXXA Bitten or stung by nonvenomous insect and other nonvenomous arthropods, initial encounter: Secondary | ICD-10-CM | POA: Diagnosis not present

## 2015-12-07 DIAGNOSIS — Z7982 Long term (current) use of aspirin: Secondary | ICD-10-CM | POA: Insufficient documentation

## 2015-12-07 DIAGNOSIS — I2510 Atherosclerotic heart disease of native coronary artery without angina pectoris: Secondary | ICD-10-CM | POA: Insufficient documentation

## 2015-12-07 DIAGNOSIS — I10 Essential (primary) hypertension: Secondary | ICD-10-CM | POA: Insufficient documentation

## 2015-12-07 DIAGNOSIS — S30861A Insect bite (nonvenomous) of abdominal wall, initial encounter: Secondary | ICD-10-CM | POA: Diagnosis not present

## 2015-12-07 DIAGNOSIS — E785 Hyperlipidemia, unspecified: Secondary | ICD-10-CM | POA: Insufficient documentation

## 2015-12-07 DIAGNOSIS — Z7984 Long term (current) use of oral hypoglycemic drugs: Secondary | ICD-10-CM | POA: Diagnosis not present

## 2015-12-07 DIAGNOSIS — Y929 Unspecified place or not applicable: Secondary | ICD-10-CM | POA: Diagnosis not present

## 2015-12-07 DIAGNOSIS — F329 Major depressive disorder, single episode, unspecified: Secondary | ICD-10-CM | POA: Insufficient documentation

## 2015-12-07 MED ORDER — DOXYCYCLINE HYCLATE 100 MG PO CAPS: 100.0000 mg | ORAL_CAPSULE | Freq: Two times a day (BID) | ORAL | Status: DC

## 2015-12-07 NOTE — Discharge Instructions (Signed)
Tick Bite Information Ticks are insects that attach themselves to the skin and draw blood for food. There are various types of ticks. Common types include wood ticks and deer ticks. Most ticks live in shrubs and grassy areas. Ticks can climb onto your body when you make contact with leaves or grass where the tick is waiting. The most common places on the body for ticks to attach themselves are the scalp, neck, armpits, waist, and groin. Most tick bites are harmless, but sometimes ticks carry germs that cause diseases. These germs can be spread to a person during the tick's feeding process. The chance of a disease spreading through a tick bite depends on:   The type of tick.  Time of year.   How long the tick is attached.   Geographic location.  HOW CAN YOU PREVENT TICK BITES? Take these steps to help prevent tick bites when you are outdoors:  Wear protective clothing. Long sleeves and long pants are best.   Wear white clothes so you can see ticks more easily.  Tuck your pant legs into your socks.   If walking on a trail, stay in the middle of the trail to avoid brushing against bushes.  Avoid walking through areas with long grass.  Put insect repellent on all exposed skin and along boot tops, pant legs, and sleeve cuffs.   Check clothing, hair, and skin repeatedly and before going inside.   Brush off any ticks that are not attached.  Take a shower or bath as soon as possible after being outdoors.  WHAT IS THE PROPER WAY TO REMOVE A TICK? Ticks should be removed as soon as possible to help prevent diseases caused by tick bites. 1. If latex gloves are available, put them on before trying to remove a tick.  2. Using fine-point tweezers, grasp the tick as close to the skin as possible. You may also use curved forceps or a tick removal tool. Grasp the tick as close to its head as possible. Avoid grasping the tick on its body. 3. Pull gently with steady upward pressure until  the tick lets go. Do not twist the tick or jerk it suddenly. This may break off the tick's head or mouth parts. 4. Do not squeeze or crush the tick's body. This could force disease-carrying fluids from the tick into your body.  5. After the tick is removed, wash the bite area and your hands with soap and water or other disinfectant such as alcohol. 6. Apply a small amount of antiseptic cream or ointment to the bite site.  7. Wash and disinfect any instruments that were used.  Do not try to remove a tick by applying a hot match, petroleum jelly, or fingernail polish to the tick. These methods do not work and may increase the chances of disease being spread from the tick bite.  WHEN SHOULD YOU SEEK MEDICAL CARE? Contact your health care provider if you are unable to remove a tick from your skin or if a part of the tick breaks off and is stuck in the skin.  After a tick bite, you need to be aware of signs and symptoms that could be related to diseases spread by ticks. Contact your health care provider if you develop any of the following in the days or weeks after the tick bite:  Unexplained fever.  Rash. A circular rash that appears days or weeks after the tick bite may indicate the possibility of Lyme disease. The rash may resemble   a target with a bull's-eye and may occur at a different part of your body than the tick bite.  Redness and swelling in the area of the tick bite.   Tender, swollen lymph glands.   Diarrhea.   Weight loss.   Cough.   Fatigue.   Muscle, joint, or bone pain.   Abdominal pain.   Headache.   Lethargy or a change in your level of consciousness.  Difficulty walking or moving your legs.   Numbness in the legs.   Paralysis.  Shortness of breath.   Confusion.   Repeated vomiting.    This information is not intended to replace advice given to you by your health care provider. Make sure you discuss any questions you have with your health  care provider.   Document Released: 06/28/2000 Document Revised: 07/22/2014 Document Reviewed: 12/09/2012 Elsevier Interactive Patient Education 2016 [employer]  

## 2015-12-07 NOTE — ED Provider Notes (Signed)
CSN: [id number]     Arrival date & time 12/07/15  1700 History   First MD Initiated Contact with Patient 12/07/15 1707     Chief Complaint  Patient presents with  . Tick bite      (Consider location/radiation/quality/duration/timing/severity/associated sxs/prior Treatment) The history is provided by the patient.  C41-year-old female removed a tick from her left flank area 4 days ago. With persistent redness to the no itching. Patient not sure how long the tick was on. Patient denies any fever any flulike symptoms. No rash. No body aches. No headache.  Past Medical History  Diagnosis Date  . Hypertension   . Hyperlipidemia   . Obesity   . Anxiety     Prior suicide attempt  . Depression   . Insulin resistance   . Iron deficiency anemia   . GERD (gastroesophageal reflux disease)   . Cervical spondylosis   . CAD (coronary artery disease)     a) s/p DES to LAD 07/2005 b) Last Myoview low risk 11/2011 showing small fixed apical perfusion defect (prior MI vs attenuation) but no ischemia - normal EF.  . Diabetes mellitus without complication [facility])    Past Surgical History  Procedure Laterality Date  . Cardiac catheterization  06/17/2007    NORMAL. EF 60%  . Coronary stent placement  07/2005    LEFT ANTERIOR DESCENDING  . Tubal ligation    . Tonsillectomy and adenoidectomy    . Childbirth      X3  . Breast enhancement surgery    . Cervical spondylosis      SINGLE LEVEL FUSION  . Incision and drainage breast abscess  01/05/2012       . Forearm fracture surgery  2010    hand and shoulder   . Incision and drainage perirectal abscess N/A 02/18/2014    Procedure: IRRIGATION AND DEBRIDEMENT PERIRECTAL ABSCESS;  Surgeon: Pedro Earls, MD;  Location: [facility];  Service: General;  Laterality: N/A;  . Rotator cuff repair      bilaterla  . Bulging disk      lower part of back   Family History  Problem Relation Age of Onset  . Heart attack Mother   . Diabetes Mother   . Lung cancer  Mother   . Asthma Mother   . Heart disease Mother   . Suicidality Father     "killed himself"  . Allergies Other     all family--seasonal allergies  . Asthma Daughter     x 2  . Cancer Daughter     pre-cancerous polyp  . Diabetes Sister   . Cancer Sister   . Cervical cancer Daughter     cervical    Social History  Substance Use Topics  . Smoking status: Never Smoker   . Smokeless tobacco: Never Used  . Alcohol Use: Yes     Comment: occ   OB History    No data available     Review of Systems  Constitutional: Negative for fever.  HENT: Negative for congestion and sore throat.   Eyes: Negative for redness.  Cardiovascular: Negative for chest pain.  Gastrointestinal: Negative for nausea, vomiting and abdominal pain.  Genitourinary: Negative for dysuria.  Musculoskeletal: Negative for myalgias.  Skin: Positive for wound. Negative for rash.  Neurological: Negative for headaches.  Hematological: Does not bruise/bleed easily.  Psychiatric/Behavioral: Negative for confusion.      Allergies  Prednisone  Home Medications   Prior to Admission medications   Medication Sig  Start Date End Date Taking? Authorizing Provider  albuterol (PROVENTIL HFA;VENTOLIN HFA) 108 (90 BASE) MCG/ACT inhaler Inhale 1 puff into the lungs every 6 (six) hours as needed for shortness of breath.    Historical Provider, MD  amLODipine (NORVASC) 10 MG tablet TAKE 1 TABLET BY MOUTH EVERY DAY 09/21/14   Larey Dresser, MD  aspirin EC 81 MG tablet Take 81 mg by mouth daily.    Historical Provider, MD  bromocriptine (PARLODEL) 2.5 MG tablet Take 0.5 tablets (1.25 mg total) by mouth at bedtime. 11/14/14   Renato Shin, MD  cetirizine (ZYRTEC) 10 MG tablet Take 1 tablet (10 mg total) by mouth daily. 11/13/14   Tishira R Brewington, PA-C  Cholecalciferol (VITAMIN D3) 1000 UNITS CAPS Take 1,000 Units by mouth daily.     Historical Provider, MD  CRESTOR 20 MG tablet TAKE 1 TABLET BY MOUTH EVERY DAY 11/16/14   Burtis Junes, NP  Cyanocobalamin (VITAMIN B 12 PO) Take 500 mg by mouth 2 (two) times daily.    Historical Provider, MD  diphenhydramine-acetaminophen (TYLENOL PM) 25-500 MG TABS Take 1 tablet by mouth at bedtime as needed (for sleep).    Historical Provider, MD  doxycycline (VIBRAMYCIN) 100 MG capsule Take 1 capsule (100 mg total) by mouth 2 (two) times daily. 12/07/15   Fredia Sorrow, MD  famotidine (PEPCID) 20 MG tablet Take 1 tablet (20 mg total) by mouth at bedtime. 06/01/15   Tammy S Parrett, NP  fexofenadine (ALLEGRA) 180 MG tablet Take 180 mg by mouth 2 (two) times daily.     Historical Provider, MD  FLUoxetine (PROZAC) 40 MG capsule Take 1 capsule (40 mg total) by mouth daily. 09/20/15   Gay Filler Copland, MD  fluticasone (VERAMYST) 27.5 MCG/SPRAY nasal spray Place 2 sprays into the nose daily as needed for rhinitis.     Historical Provider, MD  guaiFENesin-codeine 100-10 MG/5ML syrup Take 10 mLs by mouth every 6 (six) hours as needed for cough. 05/23/15   Sela Hua, MD  hydrOXYzine (ATARAX/VISTARIL) 25 MG tablet TAKE 1/2 TO 1 TABLET AT BEDTIME AS NEEDED FOR ITCHING 06/20/15   Thao P Le, DO  ibuprofen (ADVIL,MOTRIN) 200 MG tablet Take 800 mg by mouth every 6 (six) hours as needed for moderate pain.     Historical Provider, MD  Liraglutide (VICTOZA) 18 MG/3ML SOPN Inject 0.1 mLs (0.6 mg total) into the skin daily. And pen needles 1/day 10/17/15   Renato Shin, MD  meclizine (ANTIVERT) 12.5 MG tablet Take 12.5 mg by mouth 3 (three) times daily as needed for dizziness.    Historical Provider, MD  metFORMIN (GLUCOPHAGE-XR) 500 MG 24 hr tablet Take 2 tablets (1,000 mg total) by mouth daily with breakfast. Patient taking differently: Take 500 mg by mouth 2 (two) times daily.  05/02/15   Renato Shin, MD  metoprolol succinate (TOPROL-XL) 100 MG 24 hr tablet TAKE 1 TABLET BY MOUTH EVERY DAY WITH BREAKFAST OR IMMEDIATELY AFTER A MEAL (NEEDS APPOINTMENT) 06/21/15   Larey Dresser, MD  nitroGLYCERIN  (NITROSTAT) 0.4 MG SL tablet Place 0.4 mg under the tongue every 5 (five) minutes as needed. 12/11/11   Dayna N Dunn, PA-C  pantoprazole (PROTONIX) 40 MG tablet Take 1 tablet (40 mg total) by mouth daily. 06/01/15 05/31/16  Tammy S Parrett, NP  repaglinide (PRANDIN) 2 MG tablet Take 1 tablet (2 mg total) by mouth 3 (three) times daily before meals. 10/17/15   Renato Shin, MD  vitamin E 400 UNIT capsule  Take 400 Units by mouth daily.    Historical Provider, MD   BP 169/77 mmHg  Pulse 112  Temp(Src) 98.8 F (37.1 C) (Oral)  Resp 20  Ht 5' (1.524 m)  Wt 81.647 kg  BMI 35.15 kg/m2  SpO2 95% Physical Exam  Constitutional: She is oriented to person, place, and time. She appears well-developed and well-nourished. No distress.  HENT:  Head: Normocephalic and atraumatic.  Mouth/Throat: Oropharynx is clear and moist.  Eyes: Conjunctivae and EOM are normal. Pupils are equal, round, and reactive to light.  Neck: Normal range of motion. Neck supple.  Cardiovascular: Normal rate, regular rhythm and normal heart sounds.   No murmur heard. Pulmonary/Chest: Effort normal and breath sounds normal. No respiratory distress.  Abdominal: Soft. Bowel sounds are normal. There is no tenderness.  Abdominal area flank area on the waistband. Area of erythema measuring 5 mm. Scab measuring about 2 mm. No retained tick parts. No surrounding erythema. No purulent discharge no fluctuance no induration.  Musculoskeletal: Normal range of motion.  Neurological: She is alert and oriented to person, place, and time. No cranial nerve deficit. She exhibits normal muscle tone. Coordination normal.  Skin: Skin is warm. No rash noted. There is erythema.  Nursing note and vitals reviewed.   ED Course  Procedures (including critical care time) Labs Review Labs Reviewed - No data to display  Imaging Review No results found. I have personally reviewed and evaluated these images and lab results as part of my medical  decision-making.   EKG Interpretation None      MDM   Final diagnoses:  Tick bite of abdomen, initial encounter    Patient with a tick bite to the left flank area unknown. Time present. Now been off for 4 days. Patient with some redness to the area. Patient without any systemic symptoms. No itching. But will treat preventively with doxycycline for any tickborne illness exposure.    Fredia Sorrow, MD 12/07/15 1747

## 2015-12-07 NOTE — ED Notes (Signed)
C/o soreness to left flank where she removed a tick 3-4 days ago-NAD-steady gait

## 2015-12-10 ENCOUNTER — Other Ambulatory Visit: Payer: Self-pay | Admitting: Nurse Practitioner

## 2015-12-12 ENCOUNTER — Encounter: Payer: Self-pay | Admitting: Emergency Medicine

## 2016-01-01 ENCOUNTER — Other Ambulatory Visit: Payer: Self-pay | Admitting: Emergency Medicine

## 2016-01-01 MED ORDER — HYDROXYZINE HCL 25 MG PO TABS: ORAL_TABLET | ORAL | Status: DC

## 2016-01-03 ENCOUNTER — Ambulatory Visit: Payer: Medicare Other | Admitting: Adult Health

## 2016-01-05 ENCOUNTER — Other Ambulatory Visit: Payer: Self-pay | Admitting: Emergency Medicine

## 2016-01-06 ENCOUNTER — Other Ambulatory Visit: Payer: Self-pay | Admitting: Family Medicine

## 2016-01-21 ENCOUNTER — Other Ambulatory Visit: Payer: Self-pay | Admitting: Endocrinology

## 2016-01-26 ENCOUNTER — Encounter (HOSPITAL_BASED_OUTPATIENT_CLINIC_OR_DEPARTMENT_OTHER): Payer: Self-pay | Admitting: *Deleted

## 2016-01-26 ENCOUNTER — Emergency Department (HOSPITAL_BASED_OUTPATIENT_CLINIC_OR_DEPARTMENT_OTHER): Payer: Medicare Other

## 2016-01-26 ENCOUNTER — Inpatient Hospital Stay (HOSPITAL_BASED_OUTPATIENT_CLINIC_OR_DEPARTMENT_OTHER)
Admission: EM | Admit: 2016-01-26 | Discharge: 2016-01-30 | DRG: 287 | Disposition: A | Discharge status: Home / Self Care | Payer: Medicare Other | Attending: Internal Medicine | Admitting: Internal Medicine

## 2016-01-26 DIAGNOSIS — R053 Chronic cough: Secondary | ICD-10-CM | POA: Diagnosis present

## 2016-01-26 DIAGNOSIS — I152 Hypertension secondary to endocrine disorders: Secondary | ICD-10-CM

## 2016-01-26 DIAGNOSIS — R079 Chest pain, unspecified: Secondary | ICD-10-CM | POA: Diagnosis not present

## 2016-01-26 DIAGNOSIS — K219 Gastro-esophageal reflux disease without esophagitis: Secondary | ICD-10-CM | POA: Diagnosis not present

## 2016-01-26 DIAGNOSIS — R0789 Other chest pain: Secondary | ICD-10-CM | POA: Diagnosis not present

## 2016-01-26 DIAGNOSIS — R072 Precordial pain: Secondary | ICD-10-CM | POA: Diagnosis not present

## 2016-01-26 DIAGNOSIS — I2510 Atherosclerotic heart disease of native coronary artery without angina pectoris: Secondary | ICD-10-CM | POA: Diagnosis present

## 2016-01-26 DIAGNOSIS — E1159 Type 2 diabetes mellitus with other circulatory complications: Secondary | ICD-10-CM

## 2016-01-26 DIAGNOSIS — E785 Hyperlipidemia, unspecified: Secondary | ICD-10-CM | POA: Diagnosis present

## 2016-01-26 DIAGNOSIS — E876 Hypokalemia: Secondary | ICD-10-CM | POA: Diagnosis present

## 2016-01-26 DIAGNOSIS — E781 Pure hyperglyceridemia: Secondary | ICD-10-CM | POA: Insufficient documentation

## 2016-01-26 DIAGNOSIS — Z6826 Body mass index (BMI) 26.0-26.9, adult: Secondary | ICD-10-CM

## 2016-01-26 DIAGNOSIS — R05 Cough: Secondary | ICD-10-CM | POA: Diagnosis present

## 2016-01-26 DIAGNOSIS — E669 Obesity, unspecified: Secondary | ICD-10-CM | POA: Diagnosis present

## 2016-01-26 DIAGNOSIS — Z955 Presence of coronary angioplasty implant and graft: Secondary | ICD-10-CM

## 2016-01-26 DIAGNOSIS — I160 Hypertensive urgency: Secondary | ICD-10-CM | POA: Diagnosis not present

## 2016-01-26 DIAGNOSIS — I10 Essential (primary) hypertension: Secondary | ICD-10-CM

## 2016-01-26 DIAGNOSIS — Z79899 Other long term (current) drug therapy: Secondary | ICD-10-CM

## 2016-01-26 DIAGNOSIS — E1165 Type 2 diabetes mellitus with hyperglycemia: Secondary | ICD-10-CM | POA: Diagnosis present

## 2016-01-26 DIAGNOSIS — I119 Hypertensive heart disease without heart failure: Secondary | ICD-10-CM | POA: Diagnosis not present

## 2016-01-26 DIAGNOSIS — E119 Type 2 diabetes mellitus without complications: Secondary | ICD-10-CM

## 2016-01-26 DIAGNOSIS — Z888 Allergy status to other drugs, medicaments and biological substances status: Secondary | ICD-10-CM

## 2016-01-26 DIAGNOSIS — Z7982 Long term (current) use of aspirin: Secondary | ICD-10-CM

## 2016-01-26 DIAGNOSIS — F418 Other specified anxiety disorders: Secondary | ICD-10-CM | POA: Diagnosis present

## 2016-01-26 HISTORY — DX: Other specified anxiety disorders: F41.8

## 2016-01-26 HISTORY — DX: Atherosclerotic heart disease of native coronary artery without angina pectoris: I25.10

## 2016-01-26 LAB — CBC WITH DIFFERENTIAL/PLATELET
Basophils Absolute: 0 10*3/uL (ref 0.0–0.1)
Basophils Relative: 0 %
Eosinophils Absolute: 0.2 10*3/uL (ref 0.0–0.7)
Eosinophils Relative: 2 %
HCT: 39.8 % (ref 36.0–46.0)
Hemoglobin: 13.3 g/dL (ref 12.0–15.0)
Lymphocytes Relative: 24 %
Lymphs Abs: 2.5 10*3/uL (ref 0.7–4.0)
MCH: 26.6 pg (ref 26.0–34.0)
MCHC: 33.4 g/dL (ref 30.0–36.0)
MCV: 79.6 fL (ref 78.0–100.0)
Monocytes Absolute: 0.7 10*3/uL (ref 0.1–1.0)
Monocytes Relative: 7 %
Neutro Abs: 7.1 10*3/uL (ref 1.7–7.7)
Neutrophils Relative %: 67 %
Platelets: 278 10*3/uL (ref 150–400)
RBC: 5 MIL/uL (ref 3.87–5.11)
RDW: 13.6 % (ref 11.5–15.5)
WBC: 10.5 10*3/uL (ref 4.0–10.5)

## 2016-01-26 LAB — COMPREHENSIVE METABOLIC PANEL
ALT: 22 U/L (ref 14–54)
AST: 28 U/L (ref 15–41)
Albumin: 3.7 g/dL (ref 3.5–5.0)
Alkaline Phosphatase: 104 U/L (ref 38–126)
Anion gap: 10 (ref 5–15)
BUN: 10 mg/dL (ref 6–20)
CO2: 26 mmol/L (ref 22–32)
Calcium: 8.9 mg/dL (ref 8.9–10.3)
Chloride: 98 mmol/L — ABNORMAL LOW (ref 101–111)
Creatinine, Ser: 0.59 mg/dL (ref 0.44–1.00)
GFR calc Af Amer: >60 mL/min (ref >60)
GFR calc non Af Amer: >60 mL/min (ref >60)
Glucose, Bld: 272 mg/dL — ABNORMAL HIGH (ref 65–99)
Potassium: 3.1 mmol/L — ABNORMAL LOW (ref 3.5–5.1)
Sodium: 134 mmol/L — ABNORMAL LOW (ref 135–145)
Total Bilirubin: 0.2 mg/dL — ABNORMAL LOW (ref 0.3–1.2)
Total Protein: 7.6 g/dL (ref 6.5–8.1)

## 2016-01-26 LAB — TROPONIN I: Troponin I: <0.03 ng/mL (ref <0.03)

## 2016-01-26 LAB — D-DIMER, QUANTITATIVE (NOT AT ARMC): D-Dimer, Quant: 0.3 ug/mL-FEU (ref 0.00–0.50)

## 2016-01-26 MED ORDER — LORAZEPAM 2 MG/ML IJ SOLN
1.0000 mg | Freq: Once | INTRAMUSCULAR | Status: AC
  Administered 2016-01-26: 1 mg via INTRAVENOUS
  Filled 2016-01-26: qty 1

## 2016-01-26 MED ORDER — NITROGLYCERIN 0.4 MG SL SUBL
0.4000 mg | SUBLINGUAL_TABLET | SUBLINGUAL | Status: DC | PRN
  Administered 2016-01-26 (×2): 0.4 mg via SUBLINGUAL
  Filled 2016-01-26 (×2): qty 1

## 2016-01-26 MED ORDER — SODIUM CHLORIDE 0.9 % IV SOLN
INTRAVENOUS | Status: DC
  Administered 2016-01-26 – 2016-01-27 (×3): via INTRAVENOUS

## 2016-01-26 MED ORDER — ASPIRIN 81 MG PO CHEW
324.0000 mg | CHEWABLE_TABLET | Freq: Once | ORAL | Status: AC
  Administered 2016-01-26: 324 mg via ORAL
  Filled 2016-01-26: qty 4

## 2016-01-26 MED ORDER — SODIUM CHLORIDE 0.9 % IV BOLUS (SEPSIS)
250.0000 mL | Freq: Once | INTRAVENOUS | Status: AC
  Administered 2016-01-26: 250 mL via INTRAVENOUS

## 2016-01-26 NOTE — ED Notes (Signed)
MD at bedside. 

## 2016-01-26 NOTE — ED Provider Notes (Signed)
CSN: [id number]     Arrival date & time 01/26/16  1839 History  By signing my name below, I, [facility], attest that this documentation has been prepared under the direction and in the presence of Fredia Sorrow, MD. Electronically Signed: Virgel Bouquet, ED Scribe. 01/26/2016. 9:16 PM.    No chief complaint on file.  Patient is a 65 y.o. female presenting with chest pain. The history is provided by the patient. No language interpreter was used.  Chest Pain Pain location:  Substernal area and L chest Pain quality: pressure and tightness   Pain radiates to:  L arm Pain radiates to the back: no   Onset quality:  Gradual Duration:  2 hours Timing:  Constant Progression:  Unchanged Chronicity:  New Relieved by:  Nothing Worsened by:  Nothing tried Ineffective treatments:  None tried Associated symptoms: abdominal pain, back pain, cough, headache, nausea and shortness of breath   Associated symptoms: no fever and not vomiting     HPI Comments: KAMINA KORTH is a 65 y.o. female with a hx of CAD, cardiac catheterization, coronary stent, HLN, anxiety who presents to the Emergency Department complaining of constant, 8/10, left-sided and substernal CP that radiates into her left arm onset 1.5 ago. She describes the pain as pressure and tightness. Pt states that her symptoms began with the worsening of her chronic SOB with exertion to SOB at rest 1 week ago followed by left-side and substernal CP 1.5 hours ago. She reports dizziness for the past three days, blurred vision, cough, nausea today, cramping abdominal pain, HA, back pain and bilateral ankle swelling, the latter two of which are baseline for her. She notes an hx of cardiac conditions including a coronary stent placement 10 years ago. She is followed by [facility] with her most recent appointment six months ago. She takes aspirin regularly but has not taken this medication today. Per husband, pt has been stressed and  anxious recently due to issues in her family. Denies hx of MIs. Denies similar symptoms in the past. Denies taking nitroglycerin at home. Denies vomiting or any other symptoms.  Past Medical History  Diagnosis Date  . Hypertension   . Hyperlipidemia   . Obesity   . Anxiety     Prior suicide attempt  . Depression   . Insulin resistance   . Iron deficiency anemia   . GERD (gastroesophageal reflux disease)   . Cervical spondylosis   . CAD (coronary artery disease)     a) s/p DES to LAD 07/2005 b) Last Myoview low risk 11/2011 showing small fixed apical perfusion defect (prior MI vs attenuation) but no ischemia - normal EF.  . Diabetes mellitus without complication [facility])    Past Surgical History  Procedure Laterality Date  . Cardiac catheterization  06/17/2007    NORMAL. EF 60%  . Coronary stent placement  07/2005    LEFT ANTERIOR DESCENDING  . Tubal ligation    . Tonsillectomy and adenoidectomy    . Childbirth      X3  . Breast enhancement surgery    . Cervical spondylosis      SINGLE LEVEL FUSION  . Incision and drainage breast abscess  01/05/2012       . Forearm fracture surgery  2010    hand and shoulder   . Incision and drainage perirectal abscess N/A 02/18/2014    Procedure: IRRIGATION AND DEBRIDEMENT PERIRECTAL ABSCESS;  Surgeon: Pedro Earls, MD;  Location: [facility];  Service: General;  Laterality:  N/A;  . Rotator cuff repair      bilaterla  . Bulging disk      lower part of back   Family History  Problem Relation Age of Onset  . Heart attack Mother   . Diabetes Mother   . Lung cancer Mother   . Asthma Mother   . Heart disease Mother   . Suicidality Father     "killed himself"  . Allergies Other     all family--seasonal allergies  . Asthma Daughter     x 2  . Cancer Daughter     pre-cancerous polyp  . Diabetes Sister   . Cancer Sister   . Cervical cancer Daughter     cervical    Social History  Substance Use Topics  . Smoking status: Never Smoker   .  Smokeless tobacco: Never Used  . Alcohol Use: Yes     Comment: occ   OB History    No data available     Review of Systems  Constitutional: Positive for chills. Negative for fever.  Eyes: Positive for visual disturbance.  Respiratory: Positive for cough and shortness of breath.   Cardiovascular: Positive for chest pain and leg swelling.  Gastrointestinal: Positive for nausea and abdominal pain. Negative for vomiting.  Genitourinary: Negative for dysuria.  Musculoskeletal: Positive for back pain.  Skin: Negative for rash.  Neurological: Positive for headaches.  Hematological: Does not bruise/bleed easily.  Psychiatric/Behavioral: Negative for confusion.      Allergies  Prednisone  Home Medications   Prior to Admission medications   Medication Sig Start Date End Date Taking? Authorizing Provider  amLODipine (NORVASC) 10 MG tablet TAKE 1 TABLET BY MOUTH EVERY DAY 09/21/14  Yes Larey Dresser, MD  aspirin EC 81 MG tablet Take 81 mg by mouth daily.   Yes Historical Provider, MD  bromocriptine (PARLODEL) 2.5 MG tablet TAKE 1/2 TABLET (1.25 MG TOTAL) BY MOUTH AT BEDTIME. 01/22/16  Yes Renato Shin, MD  cetirizine (ZYRTEC) 10 MG tablet TAKE 1 TABLET BY MOUTH EVERY DAY 01/08/16  Yes Gay Filler Copland, MD  Cholecalciferol (VITAMIN D3) 1000 UNITS CAPS Take 1,000 Units by mouth daily.    Yes Historical Provider, MD  CRESTOR 20 MG tablet TAKE 1 TABLET BY MOUTH EVERY DAY 11/16/14  Yes Burtis Junes, NP  Cyanocobalamin (VITAMIN B 12 PO) Take 500 mg by mouth 2 (two) times daily.   Yes Historical Provider, MD  diphenhydramine-acetaminophen (TYLENOL PM) 25-500 MG TABS Take 1 tablet by mouth at bedtime as needed (for sleep).   Yes Historical Provider, MD  fexofenadine (ALLEGRA) 180 MG tablet Take 180 mg by mouth 2 (two) times daily.    Yes Historical Provider, MD  FLUoxetine (PROZAC) 40 MG capsule Take 1 capsule (40 mg total) by mouth daily. 09/20/15  Yes Gay Filler Copland, MD  hydrOXYzine  (ATARAX/VISTARIL) 25 MG tablet TAKE 1/2 TO 1 TABLET AT BEDTIME AS NEEDED FOR ITCHING 01/01/16  Yes Jessica C Copland, MD  ibuprofen (ADVIL,MOTRIN) 200 MG tablet Take 800 mg by mouth every 6 (six) hours as needed for moderate pain.    Yes Historical Provider, MD  meclizine (ANTIVERT) 12.5 MG tablet Take 12.5 mg by mouth 3 (three) times daily as needed for dizziness.   Yes Historical Provider, MD  metFORMIN (GLUCOPHAGE-XR) 500 MG 24 hr tablet Take 2 tablets (1,000 mg total) by mouth daily with breakfast. Patient taking differently: Take 500 mg by mouth 2 (two) times daily.  05/02/15  Yes Hilliard Clark  Loanne Drilling, MD  metoprolol succinate (TOPROL-XL) 100 MG 24 hr tablet TAKE 1 TABLET BY MOUTH EVERY DAY WITH BREAKFAST OR IMMEDIATELY AFTER A MEAL (NEEDS APPOINTMENT) 06/21/15  Yes Larey Dresser, MD  metoprolol succinate (TOPROL-XL) 100 MG 24 hr tablet TAKE 1 TABLET BY MOUTH EVERY DAY 12/12/15  Yes Larey Dresser, MD  nitroGLYCERIN (NITROSTAT) 0.4 MG SL tablet Place 0.4 mg under the tongue every 5 (five) minutes as needed. 12/11/11  Yes Dayna N Dunn, PA-C  pantoprazole (PROTONIX) 40 MG tablet Take 1 tablet (40 mg total) by mouth daily. 06/01/15 05/31/16 Yes Tammy S Parrett, NP  repaglinide (PRANDIN) 2 MG tablet Take 1 tablet (2 mg total) by mouth 3 (three) times daily before meals. 10/17/15  Yes Renato Shin, MD  vitamin E 400 UNIT capsule Take 400 Units by mouth daily.   Yes Historical Provider, MD   BP 147/70 mmHg  Pulse 114  Temp(Src) 98.7 F (37.1 C) (Oral)  Resp 17  Ht 5" (0.127 m)  Wt 180 lb (81.647 kg)  BMI 5062.12 kg/m2  SpO2 92% Physical Exam  Constitutional: She is oriented to person, place, and time. She appears well-developed and well-nourished. No distress.  HENT:  Head: Normocephalic and atraumatic.  Mucus membranes moist.  Eyes: Conjunctivae and EOM are normal. Pupils are equal, round, and reactive to light. No scleral icterus.  Scleras clear. Pupils and EOM normal.  Neck: Normal range of  motion.  Cardiovascular: Regular rhythm.  Tachycardia present.   Tachycardia but regular rhythm.  Pulmonary/Chest: Effort normal. No respiratory distress. She exhibits tenderness.  Lungs CTA. Room air stats at 93%. Reproducible chest pain to left upper chest.  Abdominal: Bowel sounds are decreased. There is no tenderness.  Musculoskeletal: Normal range of motion.  Neurological: She is alert and oriented to person, place, and time.  Skin: Skin is warm and dry.  Psychiatric: She has a normal mood and affect. Her behavior is normal.  Nursing note and vitals reviewed.   ED Course  Procedures (including critical care time)  DIAGNOSTIC STUDIES: Oxygen Saturation is 92% on RA, adequate by my interpretation.    COORDINATION OF CARE: 7:30 PM Will order aspirin, IV fluids, nitroglycerin, and labs. Discussed treatment plan with pt at bedside and pt agreed to plan.  8:50 PM Ordered Ativan. Returned to check pt and discuss results of labs. Pt's CP has improved to 1/10 in severity after receiving nitroglycerin 2x. Discussed possible admission to the hospital and pt expresses understanding of this plan. Will consult with hospitalist.  9:15 PM Consulted with hospitalist about pt who agreed to admission for the pt. [facility] to [facility] for [facility].  Labs Review Labs Reviewed  COMPREHENSIVE METABOLIC PANEL - Abnormal; Notable for the following:    Sodium 134 (*)    Potassium 3.1 (*)    Chloride 98 (*)    Glucose, Bld 272 (*)    Total Bilirubin 0.2 (*)    All other components within normal limits  CBC WITH DIFFERENTIAL/PLATELET  TROPONIN I  D-DIMER, QUANTITATIVE (NOT AT [facility])   Results for orders placed or performed during the hospital encounter of 01/26/16  CBC with Differential  Result Value Ref Range   WBC 10.5 4.0 - 10.5 K/uL   RBC 5.00 3.87 - 5.11 MIL/uL   Hemoglobin 13.3 12.0 - 15.0 g/dL   HCT 39.8 36.0 - 46.0 %   MCV 79.6 78.0 - 100.0 fL   MCH 26.6 26.0 - 34.0 pg  MCHC 33.4 30.0 - 36.0 g/dL   RDW 13.6 11.5 - 15.5 %   Platelets 278 150 - 400 K/uL   Neutrophils Relative % 67 %   Neutro Abs 7.1 1.7 - 7.7 K/uL   Lymphocytes Relative 24 %   Lymphs Abs 2.5 0.7 - 4.0 K/uL   Monocytes Relative 7 %   Monocytes Absolute 0.7 0.1 - 1.0 K/uL   Eosinophils Relative 2 %   Eosinophils Absolute 0.2 0.0 - 0.7 K/uL   Basophils Relative 0 %   Basophils Absolute 0.0 0.0 - 0.1 K/uL  Comprehensive metabolic panel  Result Value Ref Range   Sodium 134 (L) 135 - 145 mmol/L   Potassium 3.1 (L) 3.5 - 5.1 mmol/L   Chloride 98 (L) 101 - 111 mmol/L   CO2 26 22 - 32 mmol/L   Glucose, Bld 272 (H) 65 - 99 mg/dL   BUN 10 6 - 20 mg/dL   Creatinine, Ser 0.59 0.44 - 1.00 mg/dL   Calcium 8.9 8.9 - 10.3 mg/dL   Total Protein 7.6 6.5 - 8.1 g/dL   Albumin 3.7 3.5 - 5.0 g/dL   AST 28 15 - 41 U/L   ALT 22 14 - 54 U/L   Alkaline Phosphatase 104 38 - 126 U/L   Total Bilirubin 0.2 (L) 0.3 - 1.2 mg/dL   GFR calc non Af Amer >60 >60 mL/min   GFR calc Af Amer >60 >60 mL/min   Anion gap 10 5 - 15  Troponin I  Result Value Ref Range   Troponin I <0.03 <0.03 ng/mL  D-dimer, quantitative (not at [facility])  Result Value Ref Range   D-Dimer, Quant 0.30 0.00 - 0.50 ug/mL-FEU     Imaging Review Dg Chest 2 View  01/26/2016  CLINICAL DATA:  Constant moderate left-sided and substernal chest pain radiates into left arm starting 90 minutes ago. EXAM: CHEST  2 VIEW COMPARISON:  06/01/2015 FINDINGS: The lungs are clear wiithout focal pneumonia, edema, pneumothorax or pleural effusion. Chronic atelectasis or scarring at the bases with haziness on the final film due to overlying soft tissue and breast prostheses. Cardiopericardial silhouette is at upper limits of normal for size. The visualized bony structures of the thorax are intact. Telemetry leads overlie the chest. IMPRESSION: No active cardiopulmonary disease. Electronically Signed   By: Misty Stanley M.D.   On: 01/26/2016 20:14   I have  personally reviewed and evaluated these images and lab results as part of my medical decision-making.   EKG Interpretation   Date/Time:  Friday January 26 2016 18:48:18 EDT Ventricular Rate:  118 PR Interval:    QRS Duration: 75 QT Interval:  330 QTC Calculation: 463 R Axis:   76 Text Interpretation:  Sinus tachycardia Atrial premature complex Low  voltage, precordial leads No significant change since last tracing  Confirmed by Chereese Cilento  MD, Dickson Kostelnik [phone]) on 01/26/2016 6:51:16 PM      MDM   Final diagnoses:  Chest pain, unspecified chest pain type    Patient with acute onset of left substernal chest pain radiating to the left arm at 6 this evening. Patient's had some increased shortness of breath over the past week as well as some lightheadedness and dizziness. No true vertigo. Patient has more chronic symptoms associated with the dizziness and and the shortness of breath. Patient's chest pain now resolved almost entirely 8 out of 10 to very slight with aspirin and nitroglycerin.  First troponins negative EKG without acute changes. Chest x-ray negative for  pneumonia pneumothorax or pulmonary edema.  Patient has a known history of coronary artery disease status post stent in 2000 and 7L ED area. Patient also with follow-up stress test which was normal 08/03/2013.  Patient states she really hasn't had any exertional chest pain like what occurred today for a very long time.  I personally performed the services described in this documentation, which was scribed in my presence. The recorded information has been reviewed and is accurate.      Fredia Sorrow, MD 01/26/16 2122

## 2016-01-26 NOTE — ED Notes (Signed)
Pt awaiting [facility] transfer. Pt and family made aware of reason for wait. Pt states she has mild pressure in chest, but c/o headache at this time.

## 2016-01-26 NOTE — ED Notes (Signed)
Pt returned from xray

## 2016-01-26 NOTE — Progress Notes (Signed)
This is a no charge note  Transfer from  [facility] per dr. Rogene Houston  65 year old lady with past medical history of CAD, s/p of stent placement 2007, hypertension, hyperlipidemia, diabetes mellitus, GERD, anxiety, obesity, who presents with chest pain.  Her CP started at about 6 PM. It is located in the substernal area, 10 out of 10 in severity, pressure-like pain, radiating to the left arm. She has chronic cough and shortness of breath. Her SOB is slightly worse today. Patient states that she has a lot of stress at home recently.  D-dimer negative, WBC 10.5, temperature normal, tachycardia, tachypnea, oxygen saturation 92% on room air, potassium 3.1, sodium 134, creatinine normal. Negative d-dimer, negative chest x-ray. Patient is accepted to telemetry bed for observation.  Ivor Costa, MD  [facility] Pager [phone]  If 7PM-7AM, please contact night-coverage [email or web address] Password [email or web address] 01/26/2016, 9:20 PM

## 2016-01-26 NOTE — ED Notes (Signed)
While in room, a family member came to room and began arguing with pt. The family member was asked to leave to the room. Pt then became tearful and more anxious. Husband and daughter who are in room are asked to create a more calming environment for patient. Pt, husband and daughter verbalized understanding.

## 2016-01-26 NOTE — ED Notes (Signed)
C/o NP coughing, sob and c/p mid chest, nauseated since Saturday. Pt has hx of stents. PT admits to a lot of stress in life at present.

## 2016-01-27 ENCOUNTER — Observation Stay (HOSPITAL_BASED_OUTPATIENT_CLINIC_OR_DEPARTMENT_OTHER): Payer: Medicare Other

## 2016-01-27 ENCOUNTER — Encounter (HOSPITAL_COMMUNITY): Payer: Self-pay | Admitting: Internal Medicine

## 2016-01-27 DIAGNOSIS — R079 Chest pain, unspecified: Secondary | ICD-10-CM | POA: Insufficient documentation

## 2016-01-27 DIAGNOSIS — E785 Hyperlipidemia, unspecified: Secondary | ICD-10-CM | POA: Diagnosis not present

## 2016-01-27 DIAGNOSIS — F418 Other specified anxiety disorders: Secondary | ICD-10-CM

## 2016-01-27 DIAGNOSIS — Z888 Allergy status to other drugs, medicaments and biological substances status: Secondary | ICD-10-CM | POA: Diagnosis not present

## 2016-01-27 DIAGNOSIS — E876 Hypokalemia: Secondary | ICD-10-CM

## 2016-01-27 DIAGNOSIS — K219 Gastro-esophageal reflux disease without esophagitis: Secondary | ICD-10-CM

## 2016-01-27 DIAGNOSIS — I200 Unstable angina: Secondary | ICD-10-CM | POA: Diagnosis not present

## 2016-01-27 DIAGNOSIS — I119 Hypertensive heart disease without heart failure: Secondary | ICD-10-CM | POA: Diagnosis not present

## 2016-01-27 DIAGNOSIS — E1165 Type 2 diabetes mellitus with hyperglycemia: Secondary | ICD-10-CM | POA: Diagnosis not present

## 2016-01-27 DIAGNOSIS — Z955 Presence of coronary angioplasty implant and graft: Secondary | ICD-10-CM | POA: Diagnosis not present

## 2016-01-27 DIAGNOSIS — R05 Cough: Secondary | ICD-10-CM

## 2016-01-27 DIAGNOSIS — Z7982 Long term (current) use of aspirin: Secondary | ICD-10-CM | POA: Diagnosis not present

## 2016-01-27 DIAGNOSIS — I160 Hypertensive urgency: Secondary | ICD-10-CM | POA: Diagnosis not present

## 2016-01-27 DIAGNOSIS — I2510 Atherosclerotic heart disease of native coronary artery without angina pectoris: Secondary | ICD-10-CM | POA: Diagnosis not present

## 2016-01-27 HISTORY — DX: Hypokalemia: E87.6

## 2016-01-27 LAB — ECHOCARDIOGRAM COMPLETE
Height: 70 in
Weight: 2958.4 oz

## 2016-01-27 LAB — GLUCOSE, CAPILLARY
Glucose-Capillary: 249 mg/dL — ABNORMAL HIGH (ref 65–99)
Glucose-Capillary: 170 mg/dL — ABNORMAL HIGH (ref 65–99)
Glucose-Capillary: 244 mg/dL — ABNORMAL HIGH (ref 65–99)
Glucose-Capillary: 187 mg/dL — ABNORMAL HIGH (ref 65–99)

## 2016-01-27 LAB — LIPID PANEL
Cholesterol: 239 mg/dL — ABNORMAL HIGH (ref 0–200)
HDL: 36 mg/dL — ABNORMAL LOW (ref >40)
LDL Cholesterol: UNDETERMINED mg/dL (ref 0–99)
Total CHOL/HDL Ratio: 6.6 RATIO
Triglycerides: 829 mg/dL — ABNORMAL HIGH (ref <150)
VLDL: UNDETERMINED mg/dL (ref 0–40)

## 2016-01-27 LAB — PROTIME-INR
INR: 0.92 (ref 0.00–1.49)
Prothrombin Time: 12.6 seconds (ref 11.6–15.2)

## 2016-01-27 LAB — TSH: TSH: 2.361 u[IU]/mL (ref 0.350–4.500)

## 2016-01-27 LAB — MRSA PCR SCREENING: MRSA by PCR: NEGATIVE

## 2016-01-27 LAB — TROPONIN I
Troponin I: <0.03 ng/mL (ref <0.03)
Troponin I: <0.03 ng/mL (ref <0.03)
Troponin I: <0.03 ng/mL (ref <0.03)

## 2016-01-27 LAB — MAGNESIUM: Magnesium: 2 mg/dL (ref 1.7–2.4)

## 2016-01-27 MED ORDER — ALPRAZOLAM 0.25 MG PO TABS: 0.2500 mg | ORAL_TABLET | Freq: Two times a day (BID) | ORAL | Status: DC | PRN

## 2016-01-27 MED ORDER — LEVALBUTEROL HCL 1.25 MG/0.5ML IN NEBU
1.2500 mg | INHALATION_SOLUTION | Freq: Four times a day (QID) | RESPIRATORY_TRACT | Status: DC
  Administered 2016-01-27: 1.25 mg via RESPIRATORY_TRACT
  Filled 2016-01-27: qty 0.5

## 2016-01-27 MED ORDER — BROMOCRIPTINE MESYLATE 2.5 MG PO TABS
1.2500 mg | ORAL_TABLET | Freq: Every day | ORAL | Status: DC
  Administered 2016-01-27 – 2016-01-29 (×4): 1.25 mg via ORAL
  Filled 2016-01-27 (×4): qty 1

## 2016-01-27 MED ORDER — HEPARIN SODIUM (PORCINE) 5000 UNIT/ML IJ SOLN
5000.0000 [IU] | Freq: Three times a day (TID) | INTRAMUSCULAR | Status: DC
  Administered 2016-01-27 – 2016-01-28 (×4): 5000 [IU] via SUBCUTANEOUS
  Filled 2016-01-27 (×5): qty 1

## 2016-01-27 MED ORDER — INSULIN ASPART 100 UNIT/ML ~~LOC~~ SOLN: 0.0000 [IU] | Freq: Every day | SUBCUTANEOUS | Status: DC

## 2016-01-27 MED ORDER — ACETAMINOPHEN 325 MG PO TABS
650.0000 mg | ORAL_TABLET | ORAL | Status: DC | PRN
  Administered 2016-01-27 – 2016-01-29 (×6): 650 mg via ORAL
  Filled 2016-01-27 (×6): qty 2

## 2016-01-27 MED ORDER — LORATADINE 10 MG PO TABS
10.0000 mg | ORAL_TABLET | Freq: Every day | ORAL | Status: DC
  Administered 2016-01-27 – 2016-01-30 (×4): 10 mg via ORAL
  Filled 2016-01-27 (×4): qty 1

## 2016-01-27 MED ORDER — LEVALBUTEROL HCL 1.25 MG/0.5ML IN NEBU: 1.2500 mg | INHALATION_SOLUTION | Freq: Four times a day (QID) | RESPIRATORY_TRACT | Status: DC | PRN

## 2016-01-27 MED ORDER — DIPHENHYDRAMINE HCL 25 MG PO CAPS: 25.0000 mg | ORAL_CAPSULE | Freq: Every evening | ORAL | Status: DC | PRN

## 2016-01-27 MED ORDER — VITAMIN D 1000 UNITS PO TABS
1000.0000 [IU] | ORAL_TABLET | Freq: Every day | ORAL | Status: DC
  Administered 2016-01-28 – 2016-01-30 (×3): 1000 [IU] via ORAL
  Filled 2016-01-27 (×4): qty 1

## 2016-01-27 MED ORDER — FLUOXETINE HCL 20 MG PO CAPS
40.0000 mg | ORAL_CAPSULE | Freq: Every day | ORAL | Status: DC
  Administered 2016-01-27 – 2016-01-30 (×4): 40 mg via ORAL
  Filled 2016-01-27 (×4): qty 2

## 2016-01-27 MED ORDER — MECLIZINE HCL 25 MG PO TABS: 12.5000 mg | ORAL_TABLET | Freq: Three times a day (TID) | ORAL | Status: DC | PRN

## 2016-01-27 MED ORDER — INSULIN ASPART 100 UNIT/ML ~~LOC~~ SOLN
0.0000 [IU] | Freq: Three times a day (TID) | SUBCUTANEOUS | Status: DC
  Administered 2016-01-27: 3 [IU] via SUBCUTANEOUS
  Administered 2016-01-27 – 2016-01-28 (×3): 5 [IU] via SUBCUTANEOUS
  Administered 2016-01-28: 8 [IU] via SUBCUTANEOUS
  Administered 2016-01-28 – 2016-01-30 (×4): 5 [IU] via SUBCUTANEOUS

## 2016-01-27 MED ORDER — VITAMIN E 180 MG (400 UNIT) PO CAPS
400.0000 [IU] | ORAL_CAPSULE | Freq: Every day | ORAL | Status: DC
  Administered 2016-01-28 – 2016-01-30 (×3): 400 [IU] via ORAL
  Filled 2016-01-27 (×4): qty 1

## 2016-01-27 MED ORDER — ASPIRIN EC 81 MG PO TBEC
81.0000 mg | DELAYED_RELEASE_TABLET | Freq: Every day | ORAL | Status: DC
  Administered 2016-01-27 – 2016-01-30 (×3): 81 mg via ORAL
  Filled 2016-01-27 (×3): qty 1

## 2016-01-27 MED ORDER — ONDANSETRON HCL 4 MG/2ML IJ SOLN: 4.0000 mg | Freq: Four times a day (QID) | INTRAMUSCULAR | Status: DC | PRN

## 2016-01-27 MED ORDER — INSULIN ASPART 100 UNIT/ML ~~LOC~~ SOLN: 0.0000 [IU] | Freq: Three times a day (TID) | SUBCUTANEOUS | Status: DC

## 2016-01-27 MED ORDER — POTASSIUM CHLORIDE 20 MEQ/15ML (10%) PO SOLN
40.0000 meq | Freq: Once | ORAL | Status: AC
  Administered 2016-01-27: 40 meq via ORAL
  Filled 2016-01-27: qty 30

## 2016-01-27 MED ORDER — HYDROXYZINE HCL 25 MG PO TABS: 12.5000 mg | ORAL_TABLET | Freq: Four times a day (QID) | ORAL | Status: DC | PRN

## 2016-01-27 MED ORDER — AMLODIPINE BESYLATE 10 MG PO TABS
10.0000 mg | ORAL_TABLET | Freq: Every day | ORAL | Status: DC
  Administered 2016-01-27 – 2016-01-30 (×4): 10 mg via ORAL
  Filled 2016-01-27 (×4): qty 1

## 2016-01-27 MED ORDER — DIPHENHYDRAMINE-APAP (SLEEP) 25-500 MG PO TABS: 1.0000 | ORAL_TABLET | Freq: Every evening | ORAL | Status: DC | PRN

## 2016-01-27 MED ORDER — MORPHINE SULFATE (PF) 2 MG/ML IV SOLN
2.0000 mg | INTRAVENOUS | Status: DC | PRN
  Administered 2016-01-27: 2 mg via INTRAVENOUS
  Filled 2016-01-27: qty 1

## 2016-01-27 MED ORDER — METOPROLOL SUCCINATE ER 100 MG PO TB24
100.0000 mg | ORAL_TABLET | Freq: Every day | ORAL | Status: DC
  Administered 2016-01-27 – 2016-01-30 (×4): 100 mg via ORAL
  Filled 2016-01-27 (×4): qty 1

## 2016-01-27 MED ORDER — ACETAMINOPHEN 500 MG PO TABS: 500.0000 mg | ORAL_TABLET | Freq: Every evening | ORAL | Status: DC | PRN

## 2016-01-27 MED ORDER — HYDRALAZINE HCL 20 MG/ML IJ SOLN: 5.0000 mg | INTRAMUSCULAR | Status: DC | PRN

## 2016-01-27 MED ORDER — DM-GUAIFENESIN ER 30-600 MG PO TB12
1.0000 | ORAL_TABLET | Freq: Two times a day (BID) | ORAL | Status: DC
  Administered 2016-01-27 – 2016-01-30 (×8): 1 via ORAL
  Filled 2016-01-27 (×8): qty 1

## 2016-01-27 MED ORDER — ROSUVASTATIN CALCIUM 10 MG PO TABS
20.0000 mg | ORAL_TABLET | Freq: Every day | ORAL | Status: DC
  Administered 2016-01-27 – 2016-01-30 (×4): 20 mg via ORAL
  Filled 2016-01-27 (×4): qty 2

## 2016-01-27 MED ORDER — PANTOPRAZOLE SODIUM 40 MG PO TBEC
40.0000 mg | DELAYED_RELEASE_TABLET | Freq: Every day | ORAL | Status: DC
  Administered 2016-01-27 – 2016-01-30 (×4): 40 mg via ORAL
  Filled 2016-01-27 (×4): qty 1

## 2016-01-27 MED ORDER — POTASSIUM CHLORIDE CRYS ER 20 MEQ PO TBCR
40.0000 meq | EXTENDED_RELEASE_TABLET | ORAL | Status: AC
  Administered 2016-01-27: 40 meq via ORAL
  Filled 2016-01-27: qty 2

## 2016-01-27 MED ORDER — CYANOCOBALAMIN 500 MCG PO TABS
500.0000 ug | ORAL_TABLET | Freq: Two times a day (BID) | ORAL | Status: DC
  Administered 2016-01-27 – 2016-01-29 (×5): 500 ug via ORAL
  Filled 2016-01-27 (×7): qty 1

## 2016-01-27 MED ORDER — INSULIN ASPART 100 UNIT/ML ~~LOC~~ SOLN
0.0000 [IU] | Freq: Every day | SUBCUTANEOUS | Status: DC
  Administered 2016-01-28: 3 [IU] via SUBCUTANEOUS
  Administered 2016-01-29: 2 [IU] via SUBCUTANEOUS

## 2016-01-27 NOTE — Progress Notes (Signed)
Patient seen and examined. Admitted after midnight with complaints of CP. Patient with heart score of 5. Currently with just soreness, denies palpitations, nausea, vomiting and diaphoresis. Patient with neg D-dimer and no acute abnormalities on her CXR. Hemodynamically stable.  Please refer to H&P written by Dr. Blaine Hamper for further info/details on admission.  Plan: -troponin so far neg -cardiology consulted and will follow rec's -will replete electrolytes as needed  -follow clinical response -most likely home in 24 hours   Barton Dubois [id number]

## 2016-01-27 NOTE — Consult Note (Signed)
CARDIOLOGY CONSULT NOTE   Patient ID: Destiny Ballard MRN: [id number] DOB/AGE: [birth year] 65 y.o.  Admit date: 01/26/2016  Primary Physician   Lamar Blinks, MD Primary Cardiologist   Dr. Harlen Labs, NP Reason for Consultation   Chest pain  Requesting Physician  Dr. Dyann Kief  HPI: Destiny Ballard is a 65 y.o. female with a history of CAD s/p DES to LAD 2007, HTN, HLD, DM, anxiety, depression with prior remote suicide attempt and nephrolithiasis who presented for chest pain.   Last cath in 2008 - stent was patent. She has had atypical chest pain ever since her PCI per note. Last Myoview 05/2014 was normal. LV Ejection Fraction: 72%. LV Wall Motion: NL LV Function; NL Wall Motion.   The patient is under a lot of stress recently due to family issue that has been progressively getting worse. Recently complaints of exertional chest tightness for the past 3 or 4 days, relieved with rest. Yesterday while walking patient has a severe substernal chest tightness that radiated to her left arm and 10 out of 10 in severity. The nausea earlier in the day. Her symptoms improved after sublingual nitroglycerin  X 2 in ED. Her pain is different from prior cardiac pain when she had a stent placement in 2007. Complains of intermittent abdominal pain. Intermittent lower extremity edema. Denies orthopnea, PND, syncope, melena or blood in her stool or urine.  Upon presentation to ER she was tachycardic and blood pressure of 205/93. EKG shows sinus tachycardia at rate of 118 bpm, PACs and nonspecific ST/T-wave changes in inferior lead.  Recent blood pressure of 162/69. Troponin x 3 negative. D-dimer negative. Triglyceride  829. Currently chest pain-free.    Past Medical History  Diagnosis Date  . Hypertension   . Hyperlipidemia   . Obesity   . Anxiety     Prior suicide attempt  . Depression   . Insulin resistance   . Iron deficiency anemia   . GERD (gastroesophageal reflux disease)   .  Cervical spondylosis   . CAD (coronary artery disease)     a) s/p DES to LAD 07/2005 b) Last Myoview low risk 11/2011 showing small fixed apical perfusion defect (prior MI vs attenuation) but no ischemia - normal EF.  . Diabetes mellitus without complication ([place])   . Depression with anxiety      Past Surgical History  Procedure Laterality Date  . Cardiac catheterization  06/17/2007    NORMAL. EF 60%  . Coronary stent placement  07/2005    LEFT ANTERIOR DESCENDING  . Tubal ligation    . Tonsillectomy and adenoidectomy    . Childbirth      X3  . Breast enhancement surgery    . Cervical spondylosis      SINGLE LEVEL FUSION  . Incision and drainage breast abscess  01/05/2012       . Forearm fracture surgery  2010    hand and shoulder   . Incision and drainage perirectal abscess N/A 02/18/2014    Procedure: IRRIGATION AND DEBRIDEMENT PERIRECTAL ABSCESS;  Surgeon: Pedro Earls, MD;  Location: [facility];  Service: General;  Laterality: N/A;  . Rotator cuff repair      bilaterla  . Bulging disk      lower part of back    Allergies  Allergen Reactions  . Prednisone Other (See Comments)    REACTION: mood swings, nightmares. "Shot doesn't bother me, reaction is just with the pill" she states she has had the steroid injections  before. From our records methylprednisone was given to her in 2013 without any complications.    I have reviewed the patient's current medications . amLODipine  10 mg Oral Daily  . aspirin EC  81 mg Oral Daily  . bromocriptine  1.25 mg Oral QHS  . cholecalciferol  1,000 Units Oral Daily  . cyanocobalamin  500 mcg Oral BID  . dextromethorphan-guaiFENesin  1 tablet Oral BID  . FLUoxetine  40 mg Oral Daily  . heparin  5,000 Units Subcutaneous Q8H  . insulin aspart  0-15 Units Subcutaneous TID WC  . insulin aspart  0-5 Units Subcutaneous QHS  . loratadine  10 mg Oral Daily  . metoprolol succinate  100 mg Oral Daily  . pantoprazole  40 mg Oral Daily  . potassium  chloride  40 mEq Oral Q4H  . rosuvastatin  20 mg Oral Daily  . vitamin E  400 Units Oral Daily   . sodium chloride 75 mL/hr at 01/27/16 0428   diphenhydrAMINE **AND** acetaminophen, acetaminophen, ALPRAZolam, hydrALAZINE, hydrOXYzine, levalbuterol, meclizine, morphine injection, nitroGLYCERIN, ondansetron (ZOFRAN) IV  Prior to Admission medications   Medication Sig Start Date End Date Taking? Authorizing Provider  amLODipine (NORVASC) 10 MG tablet TAKE 1 TABLET BY MOUTH EVERY DAY 09/21/14  Yes Larey Dresser, MD  aspirin EC 81 MG tablet Take 81 mg by mouth daily.   Yes Historical Provider, MD  bromocriptine (PARLODEL) 2.5 MG tablet TAKE 1/2 TABLET (1.25 MG TOTAL) BY MOUTH AT BEDTIME. 01/22/16  Yes Renato Shin, MD  cetirizine (ZYRTEC) 10 MG tablet TAKE 1 TABLET BY MOUTH EVERY DAY 01/08/16  Yes Gay Filler Copland, MD  Cholecalciferol (VITAMIN D3) 1000 UNITS CAPS Take 1,000 Units by mouth daily.    Yes Historical Provider, MD  CRESTOR 20 MG tablet TAKE 1 TABLET BY MOUTH EVERY DAY 11/16/14  Yes Burtis Junes, NP  Cyanocobalamin (VITAMIN B 12 PO) Take 500 mg by mouth 2 (two) times daily.   Yes Historical Provider, MD  diphenhydramine-acetaminophen (TYLENOL PM) 25-500 MG TABS Take 1 tablet by mouth at bedtime as needed (for sleep).   Yes Historical Provider, MD  fexofenadine (ALLEGRA) 180 MG tablet Take 180 mg by mouth 2 (two) times daily.    Yes Historical Provider, MD  FLUoxetine (PROZAC) 40 MG capsule Take 1 capsule (40 mg total) by mouth daily. 09/20/15  Yes Gay Filler Copland, MD  hydrOXYzine (ATARAX/VISTARIL) 25 MG tablet TAKE 1/2 TO 1 TABLET AT BEDTIME AS NEEDED FOR ITCHING 01/01/16  Yes Jessica C Copland, MD  ibuprofen (ADVIL,MOTRIN) 200 MG tablet Take 800 mg by mouth every 6 (six) hours as needed for moderate pain.    Yes Historical Provider, MD  meclizine (ANTIVERT) 12.5 MG tablet Take 12.5 mg by mouth 3 (three) times daily as needed for dizziness.   Yes Historical Provider, MD  metFORMIN  (GLUCOPHAGE-XR) 500 MG 24 hr tablet Take 2 tablets (1,000 mg total) by mouth daily with breakfast. Patient taking differently: Take 500 mg by mouth 2 (two) times daily.  05/02/15  Yes Renato Shin, MD  metoprolol succinate (TOPROL-XL) 100 MG 24 hr tablet TAKE 1 TABLET BY MOUTH EVERY DAY WITH BREAKFAST OR IMMEDIATELY AFTER A MEAL (NEEDS APPOINTMENT) 06/21/15  Yes Larey Dresser, MD  metoprolol succinate (TOPROL-XL) 100 MG 24 hr tablet TAKE 1 TABLET BY MOUTH EVERY DAY 12/12/15  Yes Larey Dresser, MD  nitroGLYCERIN (NITROSTAT) 0.4 MG SL tablet Place 0.4 mg under the tongue every 5 (five) minutes as needed. 12/11/11  Yes Dayna N Dunn, PA-C  pantoprazole (PROTONIX) 40 MG tablet Take 1 tablet (40 mg total) by mouth daily. 06/01/15 05/31/16 Yes Tammy S Parrett, NP  repaglinide (PRANDIN) 2 MG tablet Take 1 tablet (2 mg total) by mouth 3 (three) times daily before meals. 10/17/15  Yes Renato Shin, MD  vitamin E 400 UNIT capsule Take 400 Units by mouth daily.   Yes Historical Provider, MD     Social History   Social History  . Marital Status: Married    Spouse Name: Lake Bells  . Number of Children: 3  . Years of Education: 12   Occupational History  . retired from [employer]     11/2012   Social History Main Topics  . Smoking status: Never Smoker   . Smokeless tobacco: Never Used  . Alcohol Use: Yes     Comment: occ  . Drug Use: No  . Sexual Activity:    Partners: Male    Birth Control/ Protection: None   Other Topics Concern  . Not on file   Social History Narrative   Lives with her husband.  Their eldest daughter lives upstairs.    Family Status  Relation Status Death Age  . Mother Deceased 57    Cancer ?Melanoma  . Father Deceased 35    self-inflicted GSW  . Daughter Alive   . Daughter Alive   . Sister Deceased     AMI  . Brother Deceased infant  . Sister Deceased 74    lung cancer (+tobacco)  . Sister Alive   . Sister Alive   . Brother Deceased 83    aneurysm  . Brother Deceased  65    AMI  . Brother Alive   . Daughter Alive    Family History  Problem Relation Age of Onset  . Heart attack Mother   . Diabetes Mother   . Lung cancer Mother   . Asthma Mother   . Heart disease Mother   . Suicidality Father     "killed himself"  . Allergies Other     all family--seasonal allergies  . Asthma Daughter     x 2  . Cancer Daughter     pre-cancerous polyp  . Diabetes Sister   . Cancer Sister   . Cervical cancer Daughter     cervical     ROS: Complains of a bulging disc sig significant low back pain.  She has cataracts and has had some blurred vision recently.  Significant situational stress and anxiety.  She has a moderate amount of GERD as well as previous GI symptoms.  She has had nausea and she has been unsteady on her feet recently that has been some concern for her.  Other than as noted above remainder of the review of systems is unremarkable.  Physical Exam: Blood pressure 162/69, pulse 111, temperature 97.8 F (36.6 C), temperature source Oral, resp. rate 17, height 5\' 10"  (1.778 m), weight 184 lb 14.4 oz (83.87 kg), SpO2 100 %.  General: Moderately obese white female currently in no acute distress Head: Eyes PERRLA, No xanthomas. Normocephalic and atraumatic, oropharynx without edema or exudate.  Lungs: Resp regular and unlabored, CTA. Tender to palpation at upper chest.  Heart: Regular rate and rhythm, normal S1 and S2, no S3, 1 to 2/6 systolic murmur  Neck: No carotid bruits. No lymphadenopathy.  No JVD. Abdomen: Bowel sounds present, abdomen soft and non-tender without masses or hernias noted. Msk:  No spine or cva tenderness. No weakness,  no joint deformities or effusions. Extremities: No clubbing, cyanosis or edema. DP/PT/Radials 2+ and equal bilaterally. Neuro: Alert and oriented X 3. No focal deficits noted. Psych:  Good affect, responds appropriately Skin: No rashes or lesions noted.  Labs:   Lab Results  Component Value Date   WBC 10.5  01/26/2016   HGB 13.3 01/26/2016   HCT 39.8 01/26/2016   MCV 79.6 01/26/2016   PLT 278 01/26/2016    Recent Labs  01/27/16 0252  INR 0.92    Recent Labs Lab 01/26/16 1950  NA 134*  K 3.1*  CL 98*  CO2 26  BUN 10  CREATININE 0.59  CALCIUM 8.9  PROT 7.6  BILITOT 0.2*  ALKPHOS 104  ALT 22  AST 28  GLUCOSE 272*  ALBUMIN 3.7   MAGNESIUM  Date Value Ref Range Status  01/27/2016 2.0 1.7 - 2.4 mg/dL Final    Recent Labs  01/26/16 1950 01/27/16 0252 01/27/16 0808  TROPONINI <0.03 <0.03 <0.03    PRO B NATRIURETIC PEPTIDE (BNP)  Date/Time Value Ref Range Status  05/10/2013 02:32 PM 15.0 0.0 - 100.0 pg/mL Final   Lab Results  Component Value Date   CHOL 239* 01/27/2016   HDL 36* 01/27/2016   LDLCALC UNABLE TO CALCULATE IF TRIGLYCERIDE OVER 400 mg/dL 01/27/2016   TRIG 829* 01/27/2016   Lab Results  Component Value Date   DDIMER 0.30 01/26/2016   LIPASE  Date/Time Value Ref Range Status  10/12/2015 08:02 PM 30 11 - 51 U/L Final   Echo: Pending  ECG:  EKG shows sinus tachycardia at rate of 118 bpm, PACs and nonspecific ST/T-wave changes in inferior lead.    Radiology:  Dg Chest 2 View  01/26/2016  CLINICAL DATA:  Constant moderate left-sided and substernal chest pain radiates into left arm starting 90 minutes ago. EXAM: CHEST  2 VIEW COMPARISON:  06/01/2015 FINDINGS: The lungs are clear wiithout focal pneumonia, edema, pneumothorax or pleural effusion. Chronic atelectasis or scarring at the bases with haziness on the final film due to overlying soft tissue and breast prostheses. Cardiopericardial silhouette is at upper limits of normal for size. The visualized bony structures of the thorax are intact. Telemetry leads overlie the chest. IMPRESSION: No active cardiopulmonary disease. Electronically Signed   By: Misty Stanley M.D.   On: 01/26/2016 20:14    ASSESSMENT AND PLAN:    Principal Problem:   Chest pain Active Problems:   Morbid obesity ([place])    Coronary atherosclerosis   GERD   Cough   Type 2 diabetes mellitus without complication, without long-term current use of insulin ([facility])   Essential hypertension, benign   HLD (hyperlipidemia)   Hypokalemia   Depression with anxiety   Pain in the chest  Plan: Troponin x 3 negative. EKG with nonspecific changes. Her chest pain has both typical and atypical features. Differential includes hypertensive urgency,  Tachycardia or stress related. Pending echocardiogram. TSH normal.  Her triglyceride is severely abnormal likely due to un- controlled diabetes. Pending  A1c. Consider addition of fibrate/loveza. Rate improved to 100s. M.D. to see.   SignedLeanor Kail, PA 01/27/2016, 10:17 AM Pager [phone]  Patient seen and examined.  The patient has a prior history of coronary artery disease with a previous stent to the LAD.  She clearly states that she has been under a lot of stress for the past month and a half.  She presented with substernal pressure described as heaviness and tightness that was yesterday at rest and eventually came  to the emergency room where she was somewhat hypertensive and had relief of the pain with nitroglycerin.  She has had some recurrence of the pain since she was here.  She also has uncontrolled diabetes and obesity.  EKG showed sinus tachycardia with minimal ST change.  She had another episode of discomfort today while the echo was being done.  1.  Chest pain in a patient with known coronary artery disease occurring at rest with some typical other atypical features-troponins are negative 2.  Hyperlipidemia not well-controlled 3.  Hypertensive heart disease not well-controlled 4.  Diabetes mellitus 5.  Obesity 6.  Significant situational stress as well as history of depression  Recommendations:  She had a Myoview test done about a year and a half ago.  He was normal.  She is continued to have some atypical chest pain and had a repeat cath after her stent in 2008  at which point it was patent..  At this point her echo shows preserved LV function.  If she continues to have chest pain I would consider repeat catheterization at this point since her last nuclear perfusion scan was just a year and a half ago and she continues to have atypical chest pain.  The symptoms this time were somewhat worrisome.  Kerry Hough. MD [facility] 01/27/2016 3:18 PM

## 2016-01-27 NOTE — Progress Notes (Signed)
Echocardiogram 2D Echocardiogram has been performed.  Destiny Ballard 01/27/2016, 2:29 PM

## 2016-01-27 NOTE — H&P (Signed)
History and Physical    Destiny Ballard [id number] DOB: [birth year] DOA: 01/26/2016  Referring MD/NP/PA:   PCP: Lamar Blinks, MD   Patient coming from:  The patient is coming from home.  At baseline, pt is independent for most of ADL.   Chief Complaint: Chest pain  HPI: Destiny Ballard is a 65 y.o. female with medical history significant of hypertension, hyperlipidemia, diabetes mellitus, GERD, anxiety, morbid obesity, CAD, s/p of stent placement 2007, who presents with chest pain.  Patient states that he started having chest pain at about 6 PM. It is located in the substernal area, initially 10 out of 10 in severity, now 3 out of 10 in severity. It is pressure-like pain, radiating to the left arm. Patient states that she has chronic dry cough and mild shortness of breath, which has not changed today. Patient had nausea earlier, which has resolved. Patient states that she has occasional mild diarrhea, but no diarrhea now. Patient does not have fever, chills, symptoms of UTI or unilateral weakness. Of note, patient states that she has been having a lot of stress in family life with her daughter recently.   ED Course: pt was found to have negative troponin, negative d-dimer, WBC 10.5, temperature normal, tachycardia, tachypnea, potassium 3.1, creatinine normal, negative chest x-ray. Patient is placed on telemetry bed for observation.  Review of Systems:   General: no fevers, chills, no changes in body weight, has fatigue HEENT: no blurry vision, hearing changes or sore throat Pulm: has dyspnea, coughing, no wheezing CV: has chest pain, no palpitations Abd: had nausea, no vomiting, abdominal pain, diarrhea, constipation GU: no dysuria, burning on urination, increased urinary frequency, hematuria  Ext: no leg edema Neuro: no unilateral weakness, numbness, or tingling, no vision change or hearing loss Skin: no rash MSK: No muscle spasm, no deformity, no limitation of range of  movement in spin Heme: No easy bruising.  Travel history: No recent long distant travel.  Allergy:  Allergies  Allergen Reactions  . Prednisone Other (See Comments)    REACTION: mood swings, nightmares. "Shot doesn't bother me, reaction is just with the pill" she states she has had the steroid injections before. From our records methylprednisone was given to her in 2013 without any complications.    Past Medical History  Diagnosis Date  . Hypertension   . Hyperlipidemia   . Obesity   . Anxiety     Prior suicide attempt  . Depression   . Insulin resistance   . Iron deficiency anemia   . GERD (gastroesophageal reflux disease)   . Cervical spondylosis   . CAD (coronary artery disease)     a) s/p DES to LAD 07/2005 b) Last Myoview low risk 11/2011 showing small fixed apical perfusion defect (prior MI vs attenuation) but no ischemia - normal EF.  . Diabetes mellitus without complication ([place])   . Depression with anxiety     Past Surgical History  Procedure Laterality Date  . Cardiac catheterization  06/17/2007    NORMAL. EF 60%  . Coronary stent placement  07/2005    LEFT ANTERIOR DESCENDING  . Tubal ligation    . Tonsillectomy and adenoidectomy    . Childbirth      X3  . Breast enhancement surgery    . Cervical spondylosis      SINGLE LEVEL FUSION  . Incision and drainage breast abscess  01/05/2012       . Forearm fracture surgery  2010    hand and shoulder   .  Incision and drainage perirectal abscess N/A 02/18/2014    Procedure: IRRIGATION AND DEBRIDEMENT PERIRECTAL ABSCESS;  Surgeon: Pedro Earls, MD;  Location: [facility];  Service: General;  Laterality: N/A;  . Rotator cuff repair      bilaterla  . Bulging disk      lower part of back    Social History:  reports that she has never smoked. She has never used smokeless tobacco. She reports that she drinks alcohol. She reports that she does not use illicit drugs.  Family History:  Family History  Problem Relation Age  of Onset  . Heart attack Mother   . Diabetes Mother   . Lung cancer Mother   . Asthma Mother   . Heart disease Mother   . Suicidality Father     "killed himself"  . Allergies Other     all family--seasonal allergies  . Asthma Daughter     x 2  . Cancer Daughter     pre-cancerous polyp  . Diabetes Sister   . Cancer Sister   . Cervical cancer Daughter     cervical      Prior to Admission medications   Medication Sig Start Date End Date Taking? Authorizing Provider  amLODipine (NORVASC) 10 MG tablet TAKE 1 TABLET BY MOUTH EVERY DAY 09/21/14  Yes Larey Dresser, MD  aspirin EC 81 MG tablet Take 81 mg by mouth daily.   Yes Historical Provider, MD  bromocriptine (PARLODEL) 2.5 MG tablet TAKE 1/2 TABLET (1.25 MG TOTAL) BY MOUTH AT BEDTIME. 01/22/16  Yes Renato Shin, MD  cetirizine (ZYRTEC) 10 MG tablet TAKE 1 TABLET BY MOUTH EVERY DAY 01/08/16  Yes Gay Filler Copland, MD  Cholecalciferol (VITAMIN D3) 1000 UNITS CAPS Take 1,000 Units by mouth daily.    Yes Historical Provider, MD  CRESTOR 20 MG tablet TAKE 1 TABLET BY MOUTH EVERY DAY 11/16/14  Yes Burtis Junes, NP  Cyanocobalamin (VITAMIN B 12 PO) Take 500 mg by mouth 2 (two) times daily.   Yes Historical Provider, MD  diphenhydramine-acetaminophen (TYLENOL PM) 25-500 MG TABS Take 1 tablet by mouth at bedtime as needed (for sleep).   Yes Historical Provider, MD  fexofenadine (ALLEGRA) 180 MG tablet Take 180 mg by mouth 2 (two) times daily.    Yes Historical Provider, MD  FLUoxetine (PROZAC) 40 MG capsule Take 1 capsule (40 mg total) by mouth daily. 09/20/15  Yes Gay Filler Copland, MD  hydrOXYzine (ATARAX/VISTARIL) 25 MG tablet TAKE 1/2 TO 1 TABLET AT BEDTIME AS NEEDED FOR ITCHING 01/01/16  Yes Jessica C Copland, MD  ibuprofen (ADVIL,MOTRIN) 200 MG tablet Take 800 mg by mouth every 6 (six) hours as needed for moderate pain.    Yes Historical Provider, MD  meclizine (ANTIVERT) 12.5 MG tablet Take 12.5 mg by mouth 3 (three) times daily as needed  for dizziness.   Yes Historical Provider, MD  metFORMIN (GLUCOPHAGE-XR) 500 MG 24 hr tablet Take 2 tablets (1,000 mg total) by mouth daily with breakfast. Patient taking differently: Take 500 mg by mouth 2 (two) times daily.  05/02/15  Yes Renato Shin, MD  metoprolol succinate (TOPROL-XL) 100 MG 24 hr tablet TAKE 1 TABLET BY MOUTH EVERY DAY WITH BREAKFAST OR IMMEDIATELY AFTER A MEAL (NEEDS APPOINTMENT) 06/21/15  Yes Larey Dresser, MD  metoprolol succinate (TOPROL-XL) 100 MG 24 hr tablet TAKE 1 TABLET BY MOUTH EVERY DAY 12/12/15  Yes Larey Dresser, MD  nitroGLYCERIN (NITROSTAT) 0.4 MG SL tablet Place 0.4 mg under  the tongue every 5 (five) minutes as needed. 12/11/11  Yes Dayna N Dunn, PA-C  pantoprazole (PROTONIX) 40 MG tablet Take 1 tablet (40 mg total) by mouth daily. 06/01/15 05/31/16 Yes Tammy S Parrett, NP  repaglinide (PRANDIN) 2 MG tablet Take 1 tablet (2 mg total) by mouth 3 (three) times daily before meals. 10/17/15  Yes Renato Shin, MD  vitamin E 400 UNIT capsule Take 400 Units by mouth daily.   Yes Historical Provider, MD    Physical Exam: Filed Vitals:   01/27/16 0000 01/27/16 0016 01/27/16 0033 01/27/16 0100  BP: 150/65 98/59 159/68 171/68  Pulse: 107 83 114 111  Temp:  98.3 F (36.8 C) 98 F (36.7 C) 98.4 F (36.9 C)  TempSrc:  Oral Oral Oral  Resp: 16  16 16   Height:    5\' 10"  (1.778 m)  Weight:    83.87 kg (184 lb 14.4 oz)  SpO2: 94%  93% 93%   General: Not in acute distress HEENT:       Eyes: PERRL, EOMI, no scleral icterus.       ENT: No discharge from the ears and nose, no pharynx injection, no tonsillar enlargement.        Neck: No JVD, no bruit, no mass felt. Heme: No neck lymph node enlargement. Cardiac: S1/S2, RRR, No murmurs, No gallops or rubs. Pulm: Good air movement bilaterally. No rales, wheezing, rhonchi or rubs. Abd: Soft, nondistended, nontender, no rebound pain, no organomegaly, BS present. GU: No hematuria Ext: No pitting leg edema bilaterally.  2+DP/PT pulse bilaterally. Musculoskeletal: No joint deformities, No joint redness or warmth, no limitation of ROM in spin. Skin: No rashes.  Neuro: Alert, oriented X3, cranial nerves II-XII grossly intact, moves all extremities normally. Psych: Patient is not psychotic, no suicidal or hemocidal ideation.  Labs on Admission: I have personally reviewed following labs and imaging studies  CBC:  Recent Labs Lab 01/26/16 1950  WBC 10.5  NEUTROABS 7.1  HGB 13.3  HCT 39.8  MCV 79.6  PLT 0000000   Basic Metabolic Panel:  Recent Labs Lab 01/26/16 1950  NA 134*  K 3.1*  CL 98*  CO2 26  GLUCOSE 272*  BUN 10  CREATININE 0.59  CALCIUM 8.9   GFR: Estimated Creatinine Clearance: 81.6 mL/min (by C-G formula based on Cr of 0.59). Liver Function Tests:  Recent Labs Lab 01/26/16 1950  AST 28  ALT 22  ALKPHOS 104  BILITOT 0.2*  PROT 7.6  ALBUMIN 3.7   No results for input(s): LIPASE, AMYLASE in the last 168 hours. No results for input(s): AMMONIA in the last 168 hours. Coagulation Profile: No results for input(s): INR, PROTIME in the last 168 hours. Cardiac Enzymes:  Recent Labs Lab 01/26/16 1950  TROPONINI <0.03   BNP (last 3 results) No results for input(s): PROBNP in the last 8760 hours. HbA1C: No results for input(s): HGBA1C in the last 72 hours. CBG: No results for input(s): GLUCAP in the last 168 hours. Lipid Profile: No results for input(s): CHOL, HDL, LDLCALC, TRIG, CHOLHDL, LDLDIRECT in the last 72 hours. Thyroid Function Tests: No results for input(s): TSH, T4TOTAL, FREET4, T3FREE, THYROIDAB in the last 72 hours. Anemia Panel: No results for input(s): VITAMINB12, FOLATE, FERRITIN, TIBC, IRON, RETICCTPCT in the last 72 hours. Urine analysis:    Component Value Date/Time   COLORURINE YELLOW 10/12/2015 2038   APPEARANCEUR CLEAR* 10/12/2015 2038   LABSPEC 1.020 10/12/2015 2038   PHURINE 5.0 10/12/2015 2038   GLUCOSEU NEGATIVE 10/12/2015  2038   HGBUR  NEGATIVE 10/12/2015 2038   BILIRUBINUR NEGATIVE 10/12/2015 2038   BILIRUBINUR neg 06/18/2012 [place] 10/12/2015 2038   PROTEINUR NEGATIVE 10/12/2015 2038   PROTEINUR 30 06/18/2012 1231   UROBILINOGEN 0.2 06/18/2012 1231   NITRITE NEGATIVE 10/12/2015 2038   NITRITE neg 06/18/2012 1231   LEUKOCYTESUR SMALL* 10/12/2015 2038   Sepsis Labs: @LABRCNTIP (procalcitonin:4,lacticidven:4) )No results found for this or any previous visit (from the past 240 hour(s)).   Radiological Exams on Admission: Dg Chest 2 View  01/26/2016  CLINICAL DATA:  Constant moderate left-sided and substernal chest pain radiates into left arm starting 90 minutes ago. EXAM: CHEST  2 VIEW COMPARISON:  06/01/2015 FINDINGS: The lungs are clear wiithout focal pneumonia, edema, pneumothorax or pleural effusion. Chronic atelectasis or scarring at the bases with haziness on the final film due to overlying soft tissue and breast prostheses. Cardiopericardial silhouette is at upper limits of normal for size. The visualized bony structures of the thorax are intact. Telemetry leads overlie the chest. IMPRESSION: No active cardiopulmonary disease. Electronically Signed   By: Misty Stanley M.D.   On: 01/26/2016 20:14     EKG: Independently reviewed. Sinus rhythm, QTC 463, tachycardia, low voltage.  Assessment/Plan Principal Problem:   Chest pain Active Problems:   Morbid obesity ([place])   Coronary atherosclerosis   GERD   Cough   Type 2 diabetes mellitus without complication, without long-term current use of insulin (HCC)   Essential hypertension, benign   HLD (hyperlipidemia)   Hypokalemia   Depression with anxiety   Chest pain and CAD: s/p of stent 2007. Chest x-ray is negative for infiltration. D-dimer negative, unlikely to have PE. Patient states that she has lot of stress with her family life, which may have contributed to her chest pain. Pt may have demanding ischemia due to elevated Bp 205/93. Given  patient's significant risk factors, including hypertension, hyperlipidemia, diabetes mellitus, previous stent placement, will to chest pain rule out. - will place on Tele bed for obs - cycle CE q6 x3 and repeat her EKG in the am  - prn Nitroglycerin, Morphine and aspirin, lipitor  - Risk factor stratification: will check FLP and A1C , TSH, UDS - 2d echo - prn Xanxa for anxity - please call Card in AM  HTN: bp was initially elevated at 205/93, which improved to 159/68. -continue amlodipine, metoprolol -When necessary hydralazine  GERD: -Protonix  DM-II: Last A1c 8.7 on 09/20/15, poorly controled. Patient is taking metformin and repaglinide at home -SSI -Check A1c  HLD: Last LDL was 70 on 03/23/12 -Continue home medication, Crestor  -Check FLP  Hypokalemia: K= 3.1 on admission. - Repleted - Check Mg level  Chronic cough and chronic mild SOB: CXR negative. Lungs clear to auscultation. Unclear etiology. -Symptomatic treatment: Mucinex for cough and when necessary Xopenex nebulizer for shortness breath  Depression and anxiety: no suicidal or homicidal ideations. Pt has a lot of stress in family life. -Continue home medications: Prozac -When necessary Xanax   DVT ppx: SQ Heparin (if pt develops severe chest pain or significantly elevated trop, will be easier to switch to IV heparin or stop heparin for procedure than using Lovenox).   Code Status: Full code Family Communication: None at bed side.   Disposition Plan:  Anticipate discharge back to previous home environment Consults called:  none Admission status: Obs / tele   Date of Service 01/27/2016    Ivor Costa [facility] Pager [phone]  If 7PM-7AM, please  contact night-coverage [email or web address] Password [email or web address] 01/27/2016, 2:26 AM

## 2016-01-28 DIAGNOSIS — E876 Hypokalemia: Secondary | ICD-10-CM | POA: Diagnosis not present

## 2016-01-28 DIAGNOSIS — E785 Hyperlipidemia, unspecified: Secondary | ICD-10-CM | POA: Diagnosis not present

## 2016-01-28 DIAGNOSIS — I10 Essential (primary) hypertension: Secondary | ICD-10-CM | POA: Diagnosis not present

## 2016-01-28 DIAGNOSIS — Z955 Presence of coronary angioplasty implant and graft: Secondary | ICD-10-CM | POA: Diagnosis not present

## 2016-01-28 DIAGNOSIS — Z6826 Body mass index (BMI) 26.0-26.9, adult: Secondary | ICD-10-CM | POA: Diagnosis not present

## 2016-01-28 DIAGNOSIS — R0789 Other chest pain: Secondary | ICD-10-CM | POA: Diagnosis not present

## 2016-01-28 DIAGNOSIS — I200 Unstable angina: Secondary | ICD-10-CM | POA: Diagnosis not present

## 2016-01-28 DIAGNOSIS — E1165 Type 2 diabetes mellitus with hyperglycemia: Secondary | ICD-10-CM | POA: Diagnosis not present

## 2016-01-28 DIAGNOSIS — I160 Hypertensive urgency: Secondary | ICD-10-CM | POA: Diagnosis not present

## 2016-01-28 DIAGNOSIS — R05 Cough: Secondary | ICD-10-CM | POA: Diagnosis not present

## 2016-01-28 DIAGNOSIS — I2510 Atherosclerotic heart disease of native coronary artery without angina pectoris: Secondary | ICD-10-CM | POA: Diagnosis not present

## 2016-01-28 DIAGNOSIS — Z888 Allergy status to other drugs, medicaments and biological substances status: Secondary | ICD-10-CM | POA: Diagnosis not present

## 2016-01-28 DIAGNOSIS — F418 Other specified anxiety disorders: Secondary | ICD-10-CM | POA: Diagnosis not present

## 2016-01-28 DIAGNOSIS — R079 Chest pain, unspecified: Secondary | ICD-10-CM | POA: Diagnosis not present

## 2016-01-28 DIAGNOSIS — E119 Type 2 diabetes mellitus without complications: Secondary | ICD-10-CM | POA: Diagnosis not present

## 2016-01-28 DIAGNOSIS — K219 Gastro-esophageal reflux disease without esophagitis: Secondary | ICD-10-CM | POA: Diagnosis not present

## 2016-01-28 DIAGNOSIS — Z79899 Other long term (current) drug therapy: Secondary | ICD-10-CM | POA: Diagnosis not present

## 2016-01-28 DIAGNOSIS — I119 Hypertensive heart disease without heart failure: Secondary | ICD-10-CM | POA: Diagnosis not present

## 2016-01-28 DIAGNOSIS — I25118 Atherosclerotic heart disease of native coronary artery with other forms of angina pectoris: Secondary | ICD-10-CM | POA: Diagnosis not present

## 2016-01-28 DIAGNOSIS — Z7982 Long term (current) use of aspirin: Secondary | ICD-10-CM | POA: Diagnosis not present

## 2016-01-28 LAB — BASIC METABOLIC PANEL
Anion gap: 7 (ref 5–15)
BUN: 8 mg/dL (ref 6–20)
CO2: 28 mmol/L (ref 22–32)
Calcium: 8.9 mg/dL (ref 8.9–10.3)
Chloride: 100 mmol/L — ABNORMAL LOW (ref 101–111)
Creatinine, Ser: 0.59 mg/dL (ref 0.44–1.00)
GFR calc Af Amer: >60 mL/min (ref >60)
GFR calc non Af Amer: >60 mL/min (ref >60)
Glucose, Bld: 203 mg/dL — ABNORMAL HIGH (ref 65–99)
Potassium: 3.8 mmol/L (ref 3.5–5.1)
Sodium: 135 mmol/L (ref 135–145)

## 2016-01-28 LAB — GLUCOSE, CAPILLARY
Glucose-Capillary: 202 mg/dL — ABNORMAL HIGH (ref 65–99)
Glucose-Capillary: 253 mg/dL — ABNORMAL HIGH (ref 65–99)
Glucose-Capillary: 235 mg/dL — ABNORMAL HIGH (ref 65–99)
Glucose-Capillary: 286 mg/dL — ABNORMAL HIGH (ref 65–99)

## 2016-01-28 MED ORDER — ALUM & MAG HYDROXIDE-SIMETH 200-200-20 MG/5ML PO SUSP
30.0000 mL | ORAL | Status: DC | PRN
  Administered 2016-01-28: 30 mL via ORAL
  Filled 2016-01-28: qty 30

## 2016-01-28 MED ORDER — ASPIRIN 81 MG PO CHEW
81.0000 mg | CHEWABLE_TABLET | ORAL | Status: AC
  Administered 2016-01-29: 81 mg via ORAL
  Filled 2016-01-28: qty 1

## 2016-01-28 MED ORDER — INSULIN DETEMIR 100 UNIT/ML ~~LOC~~ SOLN
7.0000 [IU] | Freq: Every day | SUBCUTANEOUS | Status: DC
  Administered 2016-01-28: 7 [IU] via SUBCUTANEOUS
  Filled 2016-01-28 (×2): qty 0.07

## 2016-01-28 MED ORDER — SODIUM CHLORIDE 0.9% FLUSH: 3.0000 mL | Freq: Two times a day (BID) | INTRAVENOUS | Status: DC

## 2016-01-28 MED ORDER — SODIUM CHLORIDE 0.9 % WEIGHT BASED INFUSION
3.0000 mL/kg/h | INTRAVENOUS | Status: AC
  Administered 2016-01-29: 3 mL/kg/h via INTRAVENOUS

## 2016-01-28 MED ORDER — SODIUM CHLORIDE 0.9% FLUSH: 3.0000 mL | INTRAVENOUS | Status: DC | PRN

## 2016-01-28 MED ORDER — OMEGA-3-ACID ETHYL ESTERS 1 G PO CAPS
1.0000 g | ORAL_CAPSULE | Freq: Two times a day (BID) | ORAL | Status: DC
  Administered 2016-01-28 – 2016-01-30 (×4): 1 g via ORAL
  Filled 2016-01-28 (×4): qty 1

## 2016-01-28 MED ORDER — HYDRALAZINE HCL 25 MG PO TABS
25.0000 mg | ORAL_TABLET | Freq: Four times a day (QID) | ORAL | Status: DC
  Administered 2016-01-28 – 2016-01-30 (×8): 25 mg via ORAL
  Filled 2016-01-28 (×7): qty 1

## 2016-01-28 MED ORDER — SODIUM CHLORIDE 0.9 % WEIGHT BASED INFUSION: 1.0000 mL/kg/h | INTRAVENOUS | Status: DC

## 2016-01-28 NOTE — Progress Notes (Signed)
Subjective:  Patient is under less stress and called her today.  Continues to complain of chest pain with some typical other atypical features.  States the chest discomfort is worse with activity and is improved with rest.  Blood pressure still mildly elevated.  Objective:  Vital Signs in the last 24 hours: BP 165/72 mmHg  Pulse 91  Temp(Src) 98.3 F (36.8 C) (Oral)  Resp 18  Ht 5\' 10"  (1.778 m)  Wt 82.146 kg (181 lb 1.6 oz)  BMI 25.99 kg/m2  SpO2 94%  Physical Exam: Obese female in no acute distress Lungs:  Clear Cardiac:  Regular rhythm, normal S1 and S2, no S3 Extremities:  No edema present  Intake/Output from previous day: 07/15 0701 - 07/16 0700 In: 1462.5 [P.O.:600; I.V.:862.5] Out: 900 [Urine:900]  Weight Filed Weights   01/26/16 1845 01/27/16 0100 01/28/16 0500  Weight: 81.647 kg (180 lb) 83.87 kg (184 lb 14.4 oz) 82.146 kg (181 lb 1.6 oz)    Lab Results: Basic Metabolic Panel:  Recent Labs  01/26/16 1950  NA 134*  K 3.1*  CL 98*  CO2 26  GLUCOSE 272*  BUN 10  CREATININE 0.59   CBC:  Recent Labs  01/26/16 1950  WBC 10.5  NEUTROABS 7.1  HGB 13.3  HCT 39.8  MCV 79.6  PLT 278   Cardiac Panel (last 3 results)  Recent Labs  01/27/16 0252 01/27/16 0808 01/27/16 1357  TROPONINI <0.03 <0.03 <0.03    Telemetry: Sinus with some periods of sinus tachycardia  Assessment/Plan:  1.  Chest discomfort possible unstable angina in a patient with known coronary artery disease 2.  Continued chest pain with some atypical features 3.  CAD with previous DES to LAD 4.  Obesity [place] 5.  Hypertension  Recommendations:  Still having chest pain some atypical features.  Had nuclear stress testing a year and a half ago.  I would favor cardiac catheterization in light of chest discomfort with the more worrisome features in that it is occurring more frequently recently.  Obviously situational stress is playing a role.  Add additional medicine for  hypertension.  Cardiac catheterization was discussed with the patient fully including risks of myocardial infarction, death, stroke, bleeding, arrhythmia, dye allergy, renal insufficiency or bleeding.  The patient understands and is willing to proceed.  Possibility of intervention at the same time also discussed with patient and they understand and are agreeable to proceed.   Kerry Hough  MD [facility]  01/28/2016, 8:38 AM

## 2016-01-28 NOTE — Progress Notes (Signed)
[facility] PROGRESS NOTE  Destiny Ballard [id number] DOB: [birth year] DOA: 01/26/2016 PCP: Lamar Blinks, MD  Interim summary and HPI 65 y.o. female with medical history significant of hypertension, hyperlipidemia, diabetes mellitus, GERD, anxiety, morbid obesity, CAD, s/p of stent placement 2007, who presents with chest pain. Patient states that he started having chest pain at about 6 PM. It is located in the substernal area, initially 10 out of 10 in severity, now 3 out of 10 in severity. It is pressure-like pain, radiating to the left arm. Patient states that she has chronic dry cough and mild shortness of breath, which has not changed today. Patient had nausea earlier, which has resolved. Patient states that she has occasional mild diarrhea, but no diarrhea now. Patient does not have fever, chills, symptoms of UTI or unilateral weakness. Of note, patient states that she has been having a lot of stress in family life with her daughter recently.   Assessment/Plan: Chest pain and CAD: s/p of stent 2007. Chest x-ray is negative for infiltration. D-dimer negative. Patient states that she has lot of stress with her family life, which may have contributed to her chest pain.  -heart score 4-5 -continue telemetry monitoring  -2-D echo with preserved EF and no wall motion abnormalities -troponin neg X 4 -since patient continue experiencing CP/SOB which is worsening with exertion plan is for heart cath on 7/17 -cardiology on board and will follow rec's  HTN:  -continue amlodipine, metoprolol -PRN hydralazine ordered   GERD: -will continue Protonix  DM-II:  -continue SSI and low dose lantus -follow A1C -continue carb diet -holding oral agents while inpatient  HLD:  -LDL unable to be calculated due to elevated TG -Continue statins and add lovaza  Hypokalemia: K= 3.1 on admission. -Repleted -Mg level WNL  Chronic cough and chronic mild SOB:  -CXR negative for infiltrates   -continue symptomatic management   Depression and anxiety:  -no suicidal or homicidal ideations.  -continue Prozac and PRN Xanax  Code Status: Full Family Communication: husband at bedside  Disposition Plan: home when medically stable   Consultants:  Cardiology   Procedures:  See below for -x-ray reports   2-D echo - Left ventricle: The cavity size was normal. There was mild  concentric hypertrophy. Systolic function was normal. The  estimated ejection fraction was in the range of 60% to 65%. Wall  motion was normal; there were no regional wall motion  abnormalities. Left ventricular diastolic function parameters  were normal. - Aortic valve: Trileaflet; normal thickness leaflets. There was no  regurgitation. - Aortic root: The aortic root was normal in size. - Mitral valve: Structurally normal valve. - Left atrium: The atrium was normal in size. - Right ventricle: The cavity size was normal. Wall thickness was  normal. Systolic function was normal. - Tricuspid valve: There was no regurgitation. - Pulmonary arteries: Systolic pressure was within the normal  range. - Inferior vena cava: The vessel was normal in size. - Pericardium, extracardiac: There was no pericardial effusion   Heart cath (planned for 7/17)   Antibiotics:  None   HPI/Subjective: Afebrile, no nausea, no vomiting. Reports still with intermittent episodes of CP and associated SOB (mainly with activity)  Objective: Filed Vitals:   01/28/16 0710 01/28/16 1122  BP: 165/72 143/65  Pulse: 91 84  Temp: 98.3 F (36.8 C) 98.4 F (36.9 C)  Resp: 18 18    Intake/Output Summary (Last 24 hours) at 01/28/16 1541 Last data filed at 01/28/16 1307  Gross per  24 hour  Intake 3032.5 ml  Output    900 ml  Net 2132.5 ml   Filed Weights   01/26/16 1845 01/27/16 0100 01/28/16 0500  Weight: 81.647 kg (180 lb) 83.87 kg (184 lb 14.4 oz) 82.146 kg (181 lb 1.6 oz)    Exam:   General:   Afebrile, no nausea, no vomiting. Reports still experiencing CP with intermittent episodes of SOB (mainly with activity). Patient denies palpitations   Cardiovascular: S1 and S2, no rubs or gallops, sinus rhythm   Respiratory: CTA bilaterally  Abdomen: soft, NT, ND, positive BS  Musculoskeletal: no edema appreciated   Data Reviewed: Basic Metabolic Panel:  Recent Labs Lab 01/26/16 1950 01/27/16 0808 01/28/16 0846  NA 134*  --  135  K 3.1*  --  3.8  CL 98*  --  100*  CO2 26  --  28  GLUCOSE 272*  --  203*  BUN 10  --  8  CREATININE 0.59  --  0.59  CALCIUM 8.9  --  8.9  MG  --  2.0  --    Liver Function Tests:  Recent Labs Lab 01/26/16 1950  AST 28  ALT 22  ALKPHOS 104  BILITOT 0.2*  PROT 7.6  ALBUMIN 3.7   CBC:  Recent Labs Lab 01/26/16 1950  WBC 10.5  NEUTROABS 7.1  HGB 13.3  HCT 39.8  MCV 79.6  PLT 278   Cardiac Enzymes:  Recent Labs Lab 01/26/16 1950 01/27/16 0252 01/27/16 0808 01/27/16 1357  TROPONINI <0.03 <0.03 <0.03 <0.03   CBG:  Recent Labs Lab 01/27/16 1300 01/27/16 1642 01/27/16 2054 01/28/16 0709 01/28/16 1121  GLUCAP 170* 244* 187* 202* 253*    Recent Results (from the past 240 hour(s))  MRSA PCR Screening     Status: None   Collection Time: 01/27/16  1:35 AM  Result Value Ref Range Status   MRSA by PCR NEGATIVE NEGATIVE Final    Comment:        The GeneXpert MRSA Assay (FDA approved for NASAL specimens only), is one component of a comprehensive MRSA colonization surveillance program. It is not intended to diagnose MRSA infection nor to guide or monitor treatment for MRSA infections.      Studies: Dg Chest 2 View  01/26/2016  CLINICAL DATA:  Constant moderate left-sided and substernal chest pain radiates into left arm starting 90 minutes ago. EXAM: CHEST  2 VIEW COMPARISON:  06/01/2015 FINDINGS: The lungs are clear wiithout focal pneumonia, edema, pneumothorax or pleural effusion. Chronic atelectasis or scarring  at the bases with haziness on the final film due to overlying soft tissue and breast prostheses. Cardiopericardial silhouette is at upper limits of normal for size. The visualized bony structures of the thorax are intact. Telemetry leads overlie the chest. IMPRESSION: No active cardiopulmonary disease. Electronically Signed   By: Misty Stanley M.D.   On: 01/26/2016 20:14    Scheduled Meds: . amLODipine  10 mg Oral Daily  . [START ON 01/29/2016] aspirin  81 mg Oral Pre-Cath  . aspirin EC  81 mg Oral Daily  . bromocriptine  1.25 mg Oral QHS  . cholecalciferol  1,000 Units Oral Daily  . cyanocobalamin  500 mcg Oral BID  . dextromethorphan-guaiFENesin  1 tablet Oral BID  . FLUoxetine  40 mg Oral Daily  . heparin  5,000 Units Subcutaneous Q8H  . hydrALAZINE  25 mg Oral QID  . insulin aspart  0-15 Units Subcutaneous TID WC  . insulin aspart  0-5  Units Subcutaneous QHS  . loratadine  10 mg Oral Daily  . metoprolol succinate  100 mg Oral Daily  . pantoprazole  40 mg Oral Daily  . rosuvastatin  20 mg Oral Daily  . sodium chloride flush  3 mL Intravenous Q12H  . vitamin E  400 Units Oral Daily   Continuous Infusions: . sodium chloride Stopped (01/28/16 1240)  . [START ON 01/29/2016] sodium chloride     Followed by  . [START ON 01/29/2016] sodium chloride      Active Problems:   Obesity (BMI 30-39.9)   GERD   Type 2 diabetes mellitus without complication, without long-term current use of insulin (HCC)   Hypertensive heart disease without CHF   HLD (hyperlipidemia)   Hypokalemia   Depression with anxiety   CAD (coronary artery disease)   Pain in the chest    Time spent: 30 minutes    Barton Dubois  [facility] Pager [phone]. If 7PM-7AM, please contact night-coverage at [email or web address], password [facility] 01/28/2016, 3:41 PM

## 2016-01-29 ENCOUNTER — Encounter (HOSPITAL_COMMUNITY)
Admission: EM | Disposition: A | Discharge status: Home / Self Care | Payer: Self-pay | Source: Home / Self Care | Attending: Internal Medicine

## 2016-01-29 ENCOUNTER — Encounter (HOSPITAL_COMMUNITY): Payer: Self-pay | Admitting: Cardiovascular Disease

## 2016-01-29 DIAGNOSIS — R079 Chest pain, unspecified: Secondary | ICD-10-CM

## 2016-01-29 DIAGNOSIS — E669 Obesity, unspecified: Secondary | ICD-10-CM

## 2016-01-29 DIAGNOSIS — R0789 Other chest pain: Secondary | ICD-10-CM

## 2016-01-29 DIAGNOSIS — E785 Hyperlipidemia, unspecified: Secondary | ICD-10-CM

## 2016-01-29 DIAGNOSIS — E781 Pure hyperglyceridemia: Secondary | ICD-10-CM

## 2016-01-29 DIAGNOSIS — E119 Type 2 diabetes mellitus without complications: Secondary | ICD-10-CM

## 2016-01-29 DIAGNOSIS — I119 Hypertensive heart disease without heart failure: Secondary | ICD-10-CM

## 2016-01-29 HISTORY — PX: CARDIAC CATHETERIZATION: SHX172

## 2016-01-29 LAB — GLUCOSE, CAPILLARY
Glucose-Capillary: 243 mg/dL — ABNORMAL HIGH (ref 65–99)
Glucose-Capillary: 181 mg/dL — ABNORMAL HIGH (ref 65–99)
Glucose-Capillary: 250 mg/dL — ABNORMAL HIGH (ref 65–99)
Glucose-Capillary: 221 mg/dL — ABNORMAL HIGH (ref 65–99)

## 2016-01-29 LAB — HEMOGLOBIN A1C
Hgb A1c MFr Bld: 9.1 % — ABNORMAL HIGH (ref 4.8–5.6)
Mean Plasma Glucose: 214 mg/dL

## 2016-01-29 SURGERY — LEFT HEART CATH AND CORONARY ANGIOGRAPHY
Anesthesia: LOCAL

## 2016-01-29 MED ORDER — VERAPAMIL HCL 2.5 MG/ML IV SOLN
INTRAVENOUS | Status: AC
  Filled 2016-01-29: qty 2

## 2016-01-29 MED ORDER — LIDOCAINE HCL (PF) 1 % IJ SOLN
INTRAMUSCULAR | Status: DC | PRN
  Administered 2016-01-29: 3 mL

## 2016-01-29 MED ORDER — HEPARIN (PORCINE) IN NACL 2-0.9 UNIT/ML-% IJ SOLN
INTRAMUSCULAR | Status: DC | PRN
  Administered 2016-01-29: 1000 mL

## 2016-01-29 MED ORDER — LIDOCAINE HCL (PF) 1 % IJ SOLN
INTRAMUSCULAR | Status: AC
  Filled 2016-01-29: qty 30

## 2016-01-29 MED ORDER — MIDAZOLAM HCL 2 MG/2ML IJ SOLN
INTRAMUSCULAR | Status: DC | PRN
  Administered 2016-01-29: 2 mg via INTRAVENOUS

## 2016-01-29 MED ORDER — VERAPAMIL HCL 2.5 MG/ML IV SOLN
INTRAVENOUS | Status: DC | PRN
  Administered 2016-01-29: 10 mL via INTRA_ARTERIAL

## 2016-01-29 MED ORDER — INSULIN DETEMIR 100 UNIT/ML ~~LOC~~ SOLN: 10.0000 [IU] | Freq: Every day | SUBCUTANEOUS | Status: DC

## 2016-01-29 MED ORDER — FENTANYL CITRATE (PF) 100 MCG/2ML IJ SOLN
INTRAMUSCULAR | Status: DC | PRN
  Administered 2016-01-29: 25 ug via INTRAVENOUS

## 2016-01-29 MED ORDER — NITROGLYCERIN 1 MG/10 ML FOR IR/CATH LAB
INTRA_ARTERIAL | Status: AC
  Filled 2016-01-29: qty 10

## 2016-01-29 MED ORDER — INSULIN DETEMIR 100 UNIT/ML ~~LOC~~ SOLN
10.0000 [IU] | Freq: Every day | SUBCUTANEOUS | Status: DC
  Administered 2016-01-29: 10 [IU] via SUBCUTANEOUS
  Filled 2016-01-29 (×2): qty 0.1

## 2016-01-29 MED ORDER — HEPARIN SODIUM (PORCINE) 1000 UNIT/ML IJ SOLN
INTRAMUSCULAR | Status: AC
  Filled 2016-01-29: qty 1

## 2016-01-29 MED ORDER — HEPARIN SODIUM (PORCINE) 1000 UNIT/ML IJ SOLN
INTRAMUSCULAR | Status: DC | PRN
  Administered 2016-01-29: 4000 [IU] via INTRAVENOUS

## 2016-01-29 MED ORDER — HEPARIN (PORCINE) IN NACL 2-0.9 UNIT/ML-% IJ SOLN
INTRAMUSCULAR | Status: AC
  Filled 2016-01-29: qty 1000

## 2016-01-29 MED ORDER — FENTANYL CITRATE (PF) 100 MCG/2ML IJ SOLN
INTRAMUSCULAR | Status: AC
  Filled 2016-01-29: qty 2

## 2016-01-29 MED ORDER — MIDAZOLAM HCL 2 MG/2ML IJ SOLN
INTRAMUSCULAR | Status: AC
  Filled 2016-01-29: qty 2

## 2016-01-29 MED ORDER — IOPAMIDOL (ISOVUE-370) INJECTION 76%
INTRAVENOUS | Status: DC | PRN
  Administered 2016-01-29: 40 mL via INTRAVENOUS

## 2016-01-29 SURGICAL SUPPLY — 13 items
CATH INFINITI 5 FR JL3.5 (CATHETERS) ×2 IMPLANT
CATH INFINITI 5FR ANG PIGTAIL (CATHETERS) IMPLANT
CATH INFINITI JR4 5F (CATHETERS) ×2 IMPLANT
COVER PRB 48X5XTLSCP FOLD TPE (BAG) ×1 IMPLANT
COVER PROBE 5X48 (BAG) ×1
DEVICE RAD COMP TR BAND LRG (VASCULAR PRODUCTS) ×2 IMPLANT
GLIDESHEATH SLEND SS 6F .021 (SHEATH) ×2 IMPLANT
KIT HEART LEFT (KITS) ×2 IMPLANT
PACK CARDIAC CATHETERIZATION (CUSTOM PROCEDURE TRAY) ×2 IMPLANT
TRANSDUCER W/STOPCOCK (MISCELLANEOUS) ×2 IMPLANT
TUBING CIL FLEX 10 FLL-RA (TUBING) ×2 IMPLANT
WIRE HI TORQ VERSACORE-J 145CM (WIRE) ×2 IMPLANT
WIRE SAFE-T 1.5MM-J .035X260CM (WIRE) ×2 IMPLANT

## 2016-01-29 NOTE — Progress Notes (Signed)
[facility] PROGRESS NOTE  Destiny Ballard [id number] DOB: [birth year] DOA: 01/26/2016 PCP: Lamar Blinks, MD  Interim summary and HPI 65 y.o. female with medical history significant of hypertension, hyperlipidemia, diabetes mellitus, GERD, anxiety, morbid obesity, CAD, s/p of stent placement 2007, who presents with chest pain. Patient states that he started having chest pain at about 6 PM. It is located in the substernal area, initially 10 out of 10 in severity, now 3 out of 10 in severity. It is pressure-like pain, radiating to the left arm. Patient states that she has chronic dry cough and mild shortness of breath, which has not changed today. Patient had nausea earlier, which has resolved. Patient states that she has occasional mild diarrhea, but no diarrhea now. Patient does not have fever, chills, symptoms of UTI or unilateral weakness. Of note, patient states that she has been having a lot of stress in family life with her daughter recently.   Assessment/Plan: Chest pain and CAD: s/p of stent 2007. Chest x-ray is negative for infiltration. D-dimer negative. Patient states that she has lot of stress with her family life, which may have contributed to her chest pain.  -heart score 4-5 -continue telemetry monitoring  -2-D echo with preserved EF and no wall motion abnormalities -troponin neg X 4 -since patient continue experiencing CP/SOB which is worsening with exertion plan is for heart cath on 7/17 -cardiology on board and will follow rec's; plan is for Cath later today   HTN:  -continue amlodipine and metoprolol -BP is stable currently  -PRN hydralazine ordered   GERD: -will continue Protonix  DM-II:  -continue SSI and lantus 10units -A1C 9.9 -continue low carb diet -holding oral agents while inpatient -given uncontrolled diabetes, will discharge on lantus   HLD with hypertriglyceridemia:  -LDL unable to be calculated due to elevated TG -Continue statins and  started on lovaza  Hypokalemia: K= 3.1 on admission. -Repleted and WNL now -Mg level WNL  Chronic cough and chronic mild SOB:  -CXR negative for infiltrates  -continue symptomatic management   Depression and anxiety:  -no suicidal or homicidal ideations.  -continue Prozac and PRN Xanax  Code Status: Full Family Communication: husband at bedside  Disposition Plan: home when medically stable; will follow cath results   Consultants:  Cardiology   Procedures:  See below for -x-ray reports   2-D echo - Left ventricle: The cavity size was normal. There was mild  concentric hypertrophy. Systolic function was normal. The  estimated ejection fraction was in the range of 60% to 65%. Wall  motion was normal; there were no regional wall motion  abnormalities. Left ventricular diastolic function parameters  were normal. - Aortic valve: Trileaflet; normal thickness leaflets. There was no  regurgitation. - Aortic root: The aortic root was normal in size. - Mitral valve: Structurally normal valve. - Left atrium: The atrium was normal in size. - Right ventricle: The cavity size was normal. Wall thickness was  normal. Systolic function was normal. - Tricuspid valve: There was no regurgitation. - Pulmonary arteries: Systolic pressure was within the normal  range. - Inferior vena cava: The vessel was normal in size. - Pericardium, extracardiac: There was no pericardial effusion   Heart cath: plan is for cath later today 7/17   Antibiotics:  None   HPI/Subjective: Afebrile, no nausea, no vomiting. Reports no further episode of CP or SOB. Slightly anxious   Objective: Filed Vitals:   01/29/16 1450 01/29/16 1505  BP: 121/64 117/67  Pulse:  Temp:    Resp:      Intake/Output Summary (Last 24 hours) at 01/29/16 1643 Last data filed at 01/29/16 0400  Gross per 24 hour  Intake    240 ml  Output   1100 ml  Net   -860 ml   Filed Weights   01/27/16 0100 01/28/16  0500 01/29/16 0400  Weight: 83.87 kg (184 lb 14.4 oz) 82.146 kg (181 lb 1.6 oz) 82.918 kg (182 lb 12.8 oz)    Exam:   General:  Afebrile, no nausea, no vomiting. Reports no CP today and denies SOB. Patient denies palpitations   Cardiovascular: S1 and S2, no rubs or gallops, sinus rhythm   Respiratory: CTA bilaterally  Abdomen: soft, NT, ND, positive BS  Musculoskeletal: no edema appreciated   Data Reviewed: Basic Metabolic Panel:  Recent Labs Lab 01/26/16 1950 01/27/16 0808 01/28/16 0846  NA 134*  --  135  K 3.1*  --  3.8  CL 98*  --  100*  CO2 26  --  28  GLUCOSE 272*  --  203*  BUN 10  --  8  CREATININE 0.59  --  0.59  CALCIUM 8.9  --  8.9  MG  --  2.0  --    Liver Function Tests:  Recent Labs Lab 01/26/16 1950  AST 28  ALT 22  ALKPHOS 104  BILITOT 0.2*  PROT 7.6  ALBUMIN 3.7   CBC:  Recent Labs Lab 01/26/16 1950  WBC 10.5  NEUTROABS 7.1  HGB 13.3  HCT 39.8  MCV 79.6  PLT 278   Cardiac Enzymes:  Recent Labs Lab 01/26/16 1950 01/27/16 0252 01/27/16 0808 01/27/16 1357  TROPONINI <0.03 <0.03 <0.03 <0.03   CBG:  Recent Labs Lab 01/28/16 1636 01/28/16 2105 01/29/16 0744 01/29/16 1344 01/29/16 1633  GLUCAP 235* 286* 243* 181* 250*    Recent Results (from the past 240 hour(s))  MRSA PCR Screening     Status: None   Collection Time: 01/27/16  1:35 AM  Result Value Ref Range Status   MRSA by PCR NEGATIVE NEGATIVE Final    Comment:        The GeneXpert MRSA Assay (FDA approved for NASAL specimens only), is one component of a comprehensive MRSA colonization surveillance program. It is not intended to diagnose MRSA infection nor to guide or monitor treatment for MRSA infections.      Studies: No results found.  Scheduled Meds: . amLODipine  10 mg Oral Daily  . aspirin EC  81 mg Oral Daily  . bromocriptine  1.25 mg Oral QHS  . cholecalciferol  1,000 Units Oral Daily  . cyanocobalamin  500 mcg Oral BID  .  dextromethorphan-guaiFENesin  1 tablet Oral BID  . FLUoxetine  40 mg Oral Daily  . heparin  5,000 Units Subcutaneous Q8H  . hydrALAZINE  25 mg Oral QID  . insulin aspart  0-15 Units Subcutaneous TID WC  . insulin aspart  0-5 Units Subcutaneous QHS  . insulin detemir  10 Units Subcutaneous QHS  . loratadine  10 mg Oral Daily  . metoprolol succinate  100 mg Oral Daily  . omega-3 acid ethyl esters  1 g Oral BID  . pantoprazole  40 mg Oral Daily  . rosuvastatin  20 mg Oral Daily  . vitamin E  400 Units Oral Daily   Continuous Infusions: . sodium chloride Stopped (01/28/16 1240)    Active Problems:   Obesity (BMI 30-39.9)   GERD   Type 2  diabetes mellitus without complication, without long-term current use of insulin (HCC)   Hypertensive heart disease without CHF   HLD (hyperlipidemia)   Hypokalemia   Depression with anxiety   CAD (coronary artery disease)   Pain in the chest   Chest pain    Time spent: 30 minutes    Barton Dubois  [facility] Pager [phone]. If 7PM-7AM, please contact night-coverage at [email or web address], password [facility] 01/29/2016, 4:43 PM  LOS: 1 day

## 2016-01-29 NOTE — Progress Notes (Signed)
Patient Name: Destiny Ballard Date of Encounter: 01/29/2016  Primary Cardiologist: Dr. Aundra Dubin   Patient profile: Destiny Ballard is a 65 y.o. female with a history of CAD s/p DES to LAD 2007, HTN, HLD, DM, anxiety, depression with prior remote suicide attempt and nephrolithiasis who presented for chest pain.   Active Problems:   Obesity (BMI 30-39.9)   GERD   Type 2 diabetes mellitus without complication, without long-term current use of insulin (HCC)   Hypertensive heart disease without CHF   HLD (hyperlipidemia)   Hypokalemia   Depression with anxiety   CAD (coronary artery disease)   Pain in the chest   Chest pain    SUBJECTIVE  Chest pain eased off. No SOB.   CURRENT MEDS . amLODipine  10 mg Oral Daily  . aspirin EC  81 mg Oral Daily  . bromocriptine  1.25 mg Oral QHS  . cholecalciferol  1,000 Units Oral Daily  . cyanocobalamin  500 mcg Oral BID  . dextromethorphan-guaiFENesin  1 tablet Oral BID  . FLUoxetine  40 mg Oral Daily  . heparin  5,000 Units Subcutaneous Q8H  . hydrALAZINE  25 mg Oral QID  . insulin aspart  0-15 Units Subcutaneous TID WC  . insulin aspart  0-5 Units Subcutaneous QHS  . insulin detemir  7 Units Subcutaneous Daily  . loratadine  10 mg Oral Daily  . metoprolol succinate  100 mg Oral Daily  . omega-3 acid ethyl esters  1 g Oral BID  . pantoprazole  40 mg Oral Daily  . rosuvastatin  20 mg Oral Daily  . sodium chloride flush  3 mL Intravenous Q12H  . vitamin E  400 Units Oral Daily    OBJECTIVE  Filed Vitals:   01/28/16 2000 01/29/16 0005 01/29/16 0400 01/29/16 0745  BP: 142/60 140/55 124/58 136/55  Pulse: 87 87 84 85  Temp: 98.7 F (37.1 C) 98.6 F (37 C) 98.1 F (36.7 C) 98 F (36.7 C)  TempSrc:    Oral  Resp: 20 19 17    Height:      Weight:   182 lb 12.8 oz (82.918 kg)   SpO2: 95% 95% 92% 94%    Intake/Output Summary (Last 24 hours) at 01/29/16 1034 Last data filed at 01/29/16 0400  Gross per 24 hour  Intake   1930 ml   Output   1100 ml  Net    830 ml   Filed Weights   01/27/16 0100 01/28/16 0500 01/29/16 0400  Weight: 184 lb 14.4 oz (83.87 kg) 181 lb 1.6 oz (82.146 kg) 182 lb 12.8 oz (82.918 kg)    PHYSICAL EXAM  General: Pleasant, NAD. Neuro: Alert and oriented X 3. Moves all extremities spontaneously. Psych: Normal affect. HEENT:  Normal  Neck: Supple without bruits or JVD. Lungs:  Resp regular and unlabored, CTA. Heart: RRR no s3, s4, or murmurs. Abdomen: Soft, non-tender, non-distended, BS + x 4.  Extremities: No clubbing, cyanosis or edema. DP/PT/Radials 2+ and equal bilaterally.  Accessory Clinical Findings  CBC  Recent Labs  01/26/16 1950  WBC 10.5  NEUTROABS 7.1  HGB 13.3  HCT 39.8  MCV 79.6  PLT 0000000   Basic Metabolic Panel  Recent Labs  01/26/16 1950 01/27/16 0808 01/28/16 0846  NA 134*  --  135  K 3.1*  --  3.8  CL 98*  --  100*  CO2 26  --  28  GLUCOSE 272*  --  203*  BUN 10  --  8  CREATININE 0.59  --  0.59  CALCIUM 8.9  --  8.9  MG  --  2.0  --    Liver Function Tests  Recent Labs  01/26/16 1950  AST 28  ALT 22  ALKPHOS 104  BILITOT 0.2*  PROT 7.6  ALBUMIN 3.7   No results for input(s): LIPASE, AMYLASE in the last 72 hours. Cardiac Enzymes  Recent Labs  01/27/16 0252 01/27/16 0808 01/27/16 1357  TROPONINI <0.03 <0.03 <0.03   BNP Invalid input(s): POCBNP D-Dimer  Recent Labs  01/26/16 1950  DDIMER 0.30   Hemoglobin A1C  Recent Labs  01/27/16 0252  HGBA1C 9.1*   Fasting Lipid Panel  Recent Labs  01/27/16 0252  CHOL 239*  HDL 36*  LDLCALC UNABLE TO CALCULATE IF TRIGLYCERIDE OVER 400 mg/dL  TRIG 829*  CHOLHDL 6.6   Thyroid Function Tests  Recent Labs  01/27/16 0252  TSH 2.361    TELE NSR without significant ventricular ectopy    ECG  No new EKG  Echocardiogram 01/27/2016  LV EF: 60% - 65%  ------------------------------------------------------------------- Indications: Chest pain  786.51.  ------------------------------------------------------------------- History: Risk factors: Hypertension. Diabetes mellitus. Dyslipidemia.  ------------------------------------------------------------------- Study Conclusions  - Left ventricle: The cavity size was normal. There was mild  concentric hypertrophy. Systolic function was normal. The  estimated ejection fraction was in the range of 60% to 65%. Wall  motion was normal; there were no regional wall motion  abnormalities. Left ventricular diastolic function parameters  were normal. - Aortic valve: Trileaflet; normal thickness leaflets. There was no  regurgitation. - Aortic root: The aortic root was normal in size. - Mitral valve: Structurally normal valve. - Left atrium: The atrium was normal in size. - Right ventricle: The cavity size was normal. Wall thickness was  normal. Systolic function was normal. - Tricuspid valve: There was no regurgitation. - Pulmonary arteries: Systolic pressure was within the normal  range. - Inferior vena cava: The vessel was normal in size. - Pericardium, extracardiac: There was no pericardial effusion.     Radiology/Studies  Dg Chest 2 View  01/26/2016  CLINICAL DATA:  Constant moderate left-sided and substernal chest pain radiates into left arm starting 90 minutes ago. EXAM: CHEST  2 VIEW COMPARISON:  06/01/2015 FINDINGS: The lungs are clear wiithout focal pneumonia, edema, pneumothorax or pleural effusion. Chronic atelectasis or scarring at the bases with haziness on the final film due to overlying soft tissue and breast prostheses. Cardiopericardial silhouette is at upper limits of normal for size. The visualized bony structures of the thorax are intact. Telemetry leads overlie the chest. IMPRESSION: No active cardiopulmonary disease. Electronically Signed   By: Misty Stanley M.D.   On: 01/26/2016 20:14    ASSESSMENT AND PLAN  1. Chest pain  - echo 01/27/2016 EF  60-65%, no RWMA, no significant valvular issue  - unclear if chest pain is related to uncontrolled HTN, but patient also has uncontrolled cholesterol and diabetes as well, given negative myoview a year and half ago, per Dr. Wynonia Lawman, will assess coronary anatomy definitively with cardiac catheterization  - if cath negative, likely discharge this today or tomorrow AM. Unable to add Imdur given h/o severe headache with nitro. Continue current medication, consider change hydralazine to 50mg  TID instead of 25mg  QID to improve compliance. Although despite uncontrolled HTN, HLD, DM, patient states she is compliant.    2. CAD s/p DES to LAD in 2007, last cath 2008 patent stent.   3. Hypertensive urgency: BP 205/93 on  arrival  4. Uncontrolled HLD: her chol 239, triglyceride 829, unable to calculate LDL  5. Uncontrolled DM: hgb A1C 9.1 on 01/27/2016  SignedAlmyra Deforest PA-C Pager: [phone] Patient seen and examined and history reviewed. Agree with above findings and plan. Patient seen post cath. Findings reviewed. No obstructive CAD. Need to focus on good BP and DM control. I don't think she will be able to go home since she does not have transportation and she states her husband isn't able to help her if she has any problems. Anticipate DC in am.  Korianna Washer [place], [place] 01/29/2016 1:14 PM

## 2016-01-29 NOTE — H&P (View-Only) (Signed)
Subjective:  Patient is under less stress and called her today.  Continues to complain of chest pain with some typical other atypical features.  States the chest discomfort is worse with activity and is improved with rest.  Blood pressure still mildly elevated.  Objective:  Vital Signs in the last 24 hours: BP 165/72 mmHg  Pulse 91  Temp(Src) 98.3 F (36.8 C) (Oral)  Resp 18  Ht 5\' 10"  (1.778 m)  Wt 82.146 kg (181 lb 1.6 oz)  BMI 25.99 kg/m2  SpO2 94%  Physical Exam: Obese female in no acute distress Lungs:  Clear Cardiac:  Regular rhythm, normal S1 and S2, no S3 Extremities:  No edema present  Intake/Output from previous day: 07/15 0701 - 07/16 0700 In: 1462.5 [P.O.:600; I.V.:862.5] Out: 900 [Urine:900]  Weight Filed Weights   01/26/16 1845 01/27/16 0100 01/28/16 0500  Weight: 81.647 kg (180 lb) 83.87 kg (184 lb 14.4 oz) 82.146 kg (181 lb 1.6 oz)    Lab Results: Basic Metabolic Panel:  Recent Labs  01/26/16 1950  NA 134*  K 3.1*  CL 98*  CO2 26  GLUCOSE 272*  BUN 10  CREATININE 0.59   CBC:  Recent Labs  01/26/16 1950  WBC 10.5  NEUTROABS 7.1  HGB 13.3  HCT 39.8  MCV 79.6  PLT 278   Cardiac Panel (last 3 results)  Recent Labs  01/27/16 0252 01/27/16 0808 01/27/16 1357  TROPONINI <0.03 <0.03 <0.03    Telemetry: Sinus with some periods of sinus tachycardia  Assessment/Plan:  1.  Chest discomfort possible unstable angina in a patient with known coronary artery disease 2.  Continued chest pain with some atypical features 3.  CAD with previous DES to LAD 4.  Obesity [place] 5.  Hypertension  Recommendations:  Still having chest pain some atypical features.  Had nuclear stress testing a year and a half ago.  I would favor cardiac catheterization in light of chest discomfort with the more worrisome features in that it is occurring more frequently recently.  Obviously situational stress is playing a role.  Add additional medicine for  hypertension.  Cardiac catheterization was discussed with the patient fully including risks of myocardial infarction, death, stroke, bleeding, arrhythmia, dye allergy, renal insufficiency or bleeding.  The patient understands and is willing to proceed.  Possibility of intervention at the same time also discussed with patient and they understand and are agreeable to proceed.   Kerry Hough  MD [facility]  01/28/2016, 8:38 AM

## 2016-01-29 NOTE — Interval H&P Note (Signed)
Cath Lab Visit (complete for each Cath Lab visit)  Clinical Evaluation Leading to the Procedure:   ACS: No.  Non-ACS:    Anginal Classification: CCS III  Anti-ischemic medical therapy: Maximal Therapy (2 or more classes of medications)  Non-Invasive Test Results: No non-invasive testing performed  Prior CABG: No previous CABG      History and Physical Interval Note:  01/29/2016 12:29 PM  Destiny Ballard  has presented today for surgery, with the diagnosis of unstable angina  The various methods of treatment have been discussed with the patient and family. After consideration of risks, benefits and other options for treatment, the patient has consented to  Procedure(s): Left Heart Cath and Coronary Angiography (N/A) as a surgical intervention .  The patient's history has been reviewed, patient examined, no change in status, stable for surgery.  I have reviewed the patient's chart and labs.  Questions were answered to the patient's satisfaction.     Sherren Mocha

## 2016-01-29 NOTE — Progress Notes (Addendum)
Inpatient Diabetes Program Recommendations  AACE/ADA: New Consensus Statement on Inpatient Glycemic Control (2015)  Target Ranges:  Prepandial:   less than 140 mg/dL      Peak postprandial:   less than 180 mg/dL (1-2 hours)      Critically ill patients:  140 - 180 mg/dL   Results for HURLEY, BOSEN (MRN [id number]) as of 01/29/2016 09:09  Ref. Range 01/28/2016 07:09 01/28/2016 11:21 01/28/2016 16:36 01/28/2016 21:05  Glucose-Capillary Latest Ref Range: 65-99 mg/dL 202 (H) 253 (H) 235 (H) 286 (H)   Results for MARLINDA, SUSSMAN (MRN [id number]) as of 01/29/2016 09:09  Ref. Range 01/29/2016 07:44  Glucose-Capillary Latest Ref Range: 65-99 mg/dL 243 (H)    Admit with: CP  History: DM, CAD  Home DM Meds: Metformin 500 mg bid       Prandin 2 mg tidwc       Victoza 0.6 mg daily (not sure if patient taking Victoza)  Current Insulin Orders: Levemir 7 units daily      Novolog Moderate Correction Scale/ SSI (0-15 units) TID AC + HS      -Per Record Review, patient saw her Endocrinologist (Dr. Renato Shin with [facility]) on 10/17/15.  At that visit, patient had stopped taking her Victoza daily due to nausea.  Dr. Loanne Drilling instructed patient to resume her Victoza at a lower dose 0.6 mg daily.  Unsure if patient currently taking Victoza at home?  -Current A1c pending.  -Note Levemir started last PM.  CBGs still quite elevated.     MD- Please consider the following in-hospital insulin adjustments while home PO DM meds are on hold:  1. Increase Levemir to 15 units daily (0.2 units/kg dosing)  2. Start Novolog Meal Coverage: Novolog 3 units tid with meals (hold if pt eats <50% of meal)   Addendum 11:30am- Spoke with patient about her latest A1c results of 9.1%.  Patient stated she sees Dr. Loanne Drilling and that she feels comfortable with how he helps her manage her DM.  Stopped taking the Victoza back in April.  Plans to follow-up with Dr. Loanne Drilling.  Does not wish to start insulin at  home yet.  Gives her husband insulin injections at home already.  Asked patient if she had any questions for me regarding her DM care at home.  Patient stated she did not.     --Will follow patient during hospitalization--  Wyn Quaker RN, MSN, CDE Diabetes Coordinator Inpatient Glycemic Control Team Team Pager: [phone] (8a-5p)

## 2016-01-30 DIAGNOSIS — I10 Essential (primary) hypertension: Secondary | ICD-10-CM

## 2016-01-30 DIAGNOSIS — E1159 Type 2 diabetes mellitus with other circulatory complications: Secondary | ICD-10-CM

## 2016-01-30 DIAGNOSIS — I152 Hypertension secondary to endocrine disorders: Secondary | ICD-10-CM

## 2016-01-30 DIAGNOSIS — I25118 Atherosclerotic heart disease of native coronary artery with other forms of angina pectoris: Secondary | ICD-10-CM

## 2016-01-30 HISTORY — DX: Hypertension secondary to endocrine disorders: I15.2

## 2016-01-30 HISTORY — DX: Type 2 diabetes mellitus with other circulatory complications: E11.59

## 2016-01-30 LAB — GLUCOSE, CAPILLARY: Glucose-Capillary: 211 mg/dL — ABNORMAL HIGH (ref 65–99)

## 2016-01-30 MED ORDER — OMEGA-3-ACID ETHYL ESTERS 1 G PO CAPS: 1.0000 g | ORAL_CAPSULE | Freq: Two times a day (BID) | ORAL | Status: DC

## 2016-01-30 MED ORDER — INSULIN PEN NEEDLE 31G X 5 MM MISC: Status: DC

## 2016-01-30 MED ORDER — INSULIN DETEMIR 100 UNIT/ML FLEXPEN: 10.0000 [IU] | PEN_INJECTOR | Freq: Every day | SUBCUTANEOUS | Status: DC

## 2016-01-30 MED ORDER — HYDRALAZINE HCL 25 MG PO TABS: 50.0000 mg | ORAL_TABLET | Freq: Three times a day (TID) | ORAL | Status: DC

## 2016-01-30 MED FILL — Nitroglycerin IV Soln 100 MCG/ML in D5W: INTRA_ARTERIAL | Qty: 10 | Status: AC

## 2016-01-30 NOTE — Discharge Summary (Signed)
Physician Discharge Summary  Destiny Ballard [id number] DOB: 06/06/1951 DOA: 01/26/2016  PCP: Lamar Blinks, MD  Admit date: 01/26/2016 Discharge date: 01/30/2016  Time spent: 35 minutes  Recommendations for Outpatient Follow-up:  Repeat BMET to follow electrolytes and renal function trend Close follow up to patient CBG's and further adjustment on her hypoglycemic regimen as needed  Please follow lipid panel and adjust statins and/or initiate fenofibrate  as needed for elevated TG  Discharge Diagnoses:  Active Problems:   Obesity (BMI 30-39.9)   GERD   Type 2 diabetes mellitus without complication, without long-term current use of insulin (HCC)   Hypertensive heart disease without CHF   HLD (hyperlipidemia)   Hypokalemia   Depression with anxiety   CAD (coronary artery disease)   Pain in the chest   Chest pain   High blood triglycerides   Essential hypertension   Discharge Condition: stable and improved. Discharge home with instructions to follow up with PCP in 2 weeks and with cardiology service as an outpatient (office will contact patient with appointment details)  Diet recommendation: heart healthy and modified carb diet   Filed Weights   01/28/16 0500 01/29/16 0400 01/30/16 0309  Weight: 82.146 kg (181 lb 1.6 oz) 82.918 kg (182 lb 12.8 oz) 83.371 kg (183 lb 12.8 oz)    History of present illness:  65 y.o. female with medical history significant of hypertension, hyperlipidemia, diabetes mellitus, GERD, anxiety, morbid obesity, CAD, s/p of stent placement 2007, who presents with chest pain. Patient states that he started having chest pain at about 6 PM. It is located in the substernal area, initially 10 out of 10 in severity, now 3 out of 10 in severity. It is pressure-like pain, radiating to the left arm. Patient states that she has chronic dry cough and mild shortness of breath, which has not changed today. Patient had nausea earlier, which has resolved. Patient  states that she has occasional mild diarrhea, but no diarrhea now. Patient does not have fever, chills, symptoms of UTI or unilateral weakness. Of note, patient states that she has been having a lot of stress in family life with her daughter recently.   Hospital Course:  Chest pain and CAD: s/p of stent 2007. Chest x-ray is negative for infiltration. D-dimer negative. Patient states that she has lot of stress with her family life, which may have contributed to her chest pain. -heart score 4-5 -2-D echo with preserved EF and no wall motion abnormalities -troponin neg X 4 -no acute ischemic changes on telemetry or EKG -since patient continue experiencing CP/SOB which was worse with exertion; she had a left heart cath on 7/17 (demonstarting non-obstructive CAD) -recommendations per cardiology are aggressive risk factors modifications and continue ASA, statins and b-blocker  HTN:  -continue amlodipine and metoprolol -BP is stable currently  -advise to follow heart healthy diet   GERD: -will continue Protonix  DM-II:  -resume oral hypoglycemic regimen and discharge on lantus 10 units for better sugar control -A1C 9.9 -continue low carb diet -advise to follow modified carb diet   HLD with hypertriglyceridemia:  -LDL unable to be calculated due to elevated TG -Continue statins and started on lovaza -advise to follow low fat diet   Hypokalemia: K= 3.1 on admission. -Repleted and WNL at discharge -Mg level WNL as well -recommend BMET at follow to reassess at electrolytes trend   Chronic cough and chronic mild SOB:  -CXR negative for infiltrates  -continue symptomatic management  -has an outpatient follow up with  Dr. Lamonte Sakai on 02/20/16  Depression and anxiety:  -no suicidal or homicidal ideations.  -continue Prozac and PRN Xanax  Procedures:  See below for -x-ray reports    2-D echo - Left ventricle: The cavity size was normal. There was mild  concentric hypertrophy.  Systolic function was normal. The  estimated ejection fraction was in the range of 60% to 65%. Wall  motion was normal; there were no regional wall motion  abnormalities. Left ventricular diastolic function parameters  were normal. - Aortic valve: Trileaflet; normal thickness leaflets. There was no  regurgitation. - Aortic root: The aortic root was normal in size. - Mitral valve: Structurally normal valve. - Left atrium: The atrium was normal in size. - Right ventricle: The cavity size was normal. Wall thickness was  normal. Systolic function was normal. - Tricuspid valve: There was no regurgitation. - Pulmonary arteries: Systolic pressure was within the normal  range. - Inferior vena cava: The vessel was normal in size. - Pericardium, extracardiac: There was no pericardial effusion   Heart cath: 01/29/16 1. Continued patency of the LAD stent 2. Mild nonobstructive CAD  3. Normal LV function by echo assessment  Consultations:  Cardiology   Discharge Exam: Filed Vitals:   01/30/16 0309 01/30/16 0830  BP: 130/61 164/77  Pulse: 82 89  Temp: 98 F (36.7 C) 97.5 F (36.4 C)  Resp: 16     General: Obese, in no distress, Afebrile, denying nausea, vomiting, CP and SOB.   Cardiovascular: S1 and S2, no rubs or gallops, sinus rhythm   Respiratory: CTA bilaterally  Abdomen: soft, NT, ND, positive BS  Musculoskeletal: no edema appreciated   Discharge Instructions   Discharge Instructions    Diet - low sodium heart healthy    Complete by:  As directed      Discharge instructions    Complete by:  As directed   Follow heart healthy diet Follow low carb diet Keep yourself well hydrated Follow low calorie diet Arrange follow up with PCP in 2 weeks          Current Discharge Medication List    START taking these medications   Details  hydrALAZINE (APRESOLINE) 25 MG tablet Take 2 tablets (50 mg total) by mouth 3 (three) times daily. Qty: 90 tablet,  Refills: 1    Insulin Detemir (LEVEMIR FLEXTOUCH) 100 UNIT/ML Pen Inject 10 Units into the skin daily. Qty: 15 mL, Refills: 3    Insulin Pen Needle 31G X 5 MM MISC Use to inject insulin as instructed once a day Qty: 100 each, Refills: 1    omega-3 acid ethyl esters (LOVAZA) 1 g capsule Take 1 capsule (1 g total) by mouth 2 (two) times daily. Qty: 60 capsule, Refills: 1      CONTINUE these medications which have NOT CHANGED   Details  amLODipine (NORVASC) 10 MG tablet TAKE 1 TABLET BY MOUTH EVERY DAY Qty: 90 tablet, Refills: 1    aspirin EC 81 MG tablet Take 81 mg by mouth daily.    bromocriptine (PARLODEL) 2.5 MG tablet TAKE 1/2 TABLET (1.25 MG TOTAL) BY MOUTH AT BEDTIME. Qty: 45 tablet, Refills: 1    cetirizine (ZYRTEC) 10 MG tablet TAKE 1 TABLET BY MOUTH EVERY DAY Qty: 30 tablet, Refills: 7    Cholecalciferol (VITAMIN D3) 1000 UNITS CAPS Take 1,000 Units by mouth daily.     CRESTOR 20 MG tablet TAKE 1 TABLET BY MOUTH EVERY DAY Qty: 90 tablet, Refills: 3    Cyanocobalamin (VITAMIN  B 12 PO) Take 500 mg by mouth 2 (two) times daily.    diphenhydramine-acetaminophen (TYLENOL PM) 25-500 MG TABS Take 1 tablet by mouth at bedtime.     fexofenadine (ALLEGRA) 180 MG tablet Take 180 mg by mouth 2 (two) times daily.     FLUoxetine (PROZAC) 40 MG capsule Take 1 capsule (40 mg total) by mouth daily. Qty: 90 capsule, Refills: 3   Associated Diagnoses: Depression    gabapentin (NEURONTIN) 300 MG capsule Take 300 mg by mouth 2 (two) times daily.    hydrOXYzine (ATARAX/VISTARIL) 25 MG tablet TAKE 1/2 TO 1 TABLET AT BEDTIME AS NEEDED FOR ITCHING Qty: 30 tablet, Refills: 0    meclizine (ANTIVERT) 12.5 MG tablet Take 12.5 mg by mouth 3 (three) times daily as needed for dizziness.    metFORMIN (GLUCOPHAGE-XR) 500 MG 24 hr tablet Take 2 tablets (1,000 mg total) by mouth daily with breakfast. Qty: 180 tablet, Refills: 3    metoprolol succinate (TOPROL-XL) 100 MG 24 hr tablet TAKE 1  TABLET BY MOUTH EVERY DAY Qty: 90 tablet, Refills: 0    nitroGLYCERIN (NITROSTAT) 0.4 MG SL tablet Place 0.4 mg under the tongue every 5 (five) minutes as needed.    pantoprazole (PROTONIX) 40 MG tablet Take 1 tablet (40 mg total) by mouth daily. Qty: 30 tablet, Refills: 3    repaglinide (PRANDIN) 2 MG tablet Take 1 tablet (2 mg total) by mouth 3 (three) times daily before meals. Qty: 90 tablet, Refills: 11    vitamin E 400 UNIT capsule Take 400 Units by mouth daily.      STOP taking these medications     ibuprofen (ADVIL,MOTRIN) 200 MG tablet        Allergies  Allergen Reactions  . Prednisone Other (See Comments)    REACTION: mood swings, nightmares. "Shot doesn't bother me, reaction is just with the pill" she states she has had the steroid injections before. From our records methylprednisone was given to her in 2013 without any complications.  . Nitroglycerin     Severe headache   Follow-up Information    Follow up with Truitt Merle, NP.   Specialties:  Nurse Practitioner, Interventional Cardiology, Cardiology, Radiology   Why:  [facility] - 03/01/16 at 2:30pm   Contact information:   [place] [place] [address] [phone]       Follow up with COPLAND,JESSICA, MD. Schedule an appointment as soon as possible for a visit in 2 weeks.   Specialty:  Family Medicine   Contact information:   [place] [address] [phone]       The results of significant diagnostics from this hospitalization (including imaging, microbiology, ancillary and laboratory) are listed below for reference.    Significant Diagnostic Studies: Dg Chest 2 View  01/26/2016  CLINICAL DATA:  Constant moderate left-sided and substernal chest pain radiates into left arm starting 90 minutes ago. EXAM: CHEST  2 VIEW COMPARISON:  06/01/2015 FINDINGS: The lungs are clear wiithout focal pneumonia, edema, pneumothorax or pleural effusion. Chronic  atelectasis or scarring at the bases with haziness on the final film due to overlying soft tissue and breast prostheses. Cardiopericardial silhouette is at upper limits of normal for size. The visualized bony structures of the thorax are intact. Telemetry leads overlie the chest. IMPRESSION: No active cardiopulmonary disease. Electronically Signed   By: Misty Stanley M.D.   On: 01/26/2016 20:14    Microbiology: Recent Results (from the past  240 hour(s))  MRSA PCR Screening     Status: None   Collection Time: 01/27/16  1:35 AM  Result Value Ref Range Status   MRSA by PCR NEGATIVE NEGATIVE Final    Comment:        The GeneXpert MRSA Assay (FDA approved for NASAL specimens only), is one component of a comprehensive MRSA colonization surveillance program. It is not intended to diagnose MRSA infection nor to guide or monitor treatment for MRSA infections.      Labs: Basic Metabolic Panel:  Recent Labs Lab 01/26/16 1950 01/27/16 0808 01/28/16 0846  NA 134*  --  135  K 3.1*  --  3.8  CL 98*  --  100*  CO2 26  --  28  GLUCOSE 272*  --  203*  BUN 10  --  8  CREATININE 0.59  --  0.59  CALCIUM 8.9  --  8.9  MG  --  2.0  --    Liver Function Tests:  Recent Labs Lab 01/26/16 1950  AST 28  ALT 22  ALKPHOS 104  BILITOT 0.2*  PROT 7.6  ALBUMIN 3.7   CBC:  Recent Labs Lab 01/26/16 1950  WBC 10.5  NEUTROABS 7.1  HGB 13.3  HCT 39.8  MCV 79.6  PLT 278   Cardiac Enzymes:  Recent Labs Lab 01/26/16 1950 01/27/16 0252 01/27/16 0808 01/27/16 1357  TROPONINI <0.03 <0.03 <0.03 <0.03   CBG:  Recent Labs Lab 01/29/16 0744 01/29/16 1344 01/29/16 1633 01/29/16 2051 01/30/16 0734  GLUCAP 243* 181* 250* 221* 211*   Signed:  Barton Dubois MD.  [facility] 01/30/2016, 10:53 AM

## 2016-01-30 NOTE — Progress Notes (Signed)
Patient: Destiny Ballard / Admit Date: 01/26/2016 / Date of Encounter: 01/30/2016, 8:05 AM   Subjective: Feeling good. No further CP. She reports increased family stress at home recently with children and wonders if that contributed to her sx and high BP. Reports lifelong hx of secondhand smoke exposure. Remotely saw pulm.  O2 sats episodically drop.   Objective: Telemetry: NSR Physical Exam: Blood pressure 130/61, pulse 82, temperature 98 F (36.7 C), temperature source Oral, resp. rate 16, height 5\' 10"  (1.778 m), weight 183 lb 12.8 oz (83.371 kg), SpO2 93 %. General: Well developed, well nourished obese WF in no acute distress. Head: Normocephalic, atraumatic, sclera non-icteric, no xanthomas, nares are without discharge. Neck: Negative for carotid bruits. JVP not elevated. Lungs: Clear bilaterally to auscultation without wheezes, rales, or rhonchi. Breathing is unlabored. Heart: RRR S1 S2 without murmurs, rubs, or gallops.  Abdomen: Soft, non-tender, non-distended with normoactive bowel sounds. No rebound/guarding. Extremities: No clubbing or cyanosis. No edema. Distal pedal pulses are 2+ and equal bilaterally. Right radial cath site without hematoma or ecchymosis; good pulse. Neuro: Alert and oriented X 3. Moves all extremities spontaneously. Psych:  Responds to questions appropriately with a normal affect.   Intake/Output Summary (Last 24 hours) at 01/30/16 0805 Last data filed at 01/29/16 1700  Gross per 24 hour  Intake    240 ml  Output      0 ml  Net    240 ml    Inpatient Medications:  . amLODipine  10 mg Oral Daily  . aspirin EC  81 mg Oral Daily  . bromocriptine  1.25 mg Oral QHS  . cholecalciferol  1,000 Units Oral Daily  . cyanocobalamin  500 mcg Oral BID  . dextromethorphan-guaiFENesin  1 tablet Oral BID  . FLUoxetine  40 mg Oral Daily  . heparin  5,000 Units Subcutaneous Q8H  . hydrALAZINE  25 mg Oral QID  . insulin aspart  0-15 Units Subcutaneous TID WC  .  insulin aspart  0-5 Units Subcutaneous QHS  . insulin detemir  10 Units Subcutaneous QHS  . loratadine  10 mg Oral Daily  . metoprolol succinate  100 mg Oral Daily  . omega-3 acid ethyl esters  1 g Oral BID  . pantoprazole  40 mg Oral Daily  . rosuvastatin  20 mg Oral Daily  . vitamin E  400 Units Oral Daily   Infusions:  . sodium chloride Stopped (01/28/16 1240)    Labs:  Recent Labs  01/27/16 0808 01/28/16 0846  NA  --  135  K  --  3.8  CL  --  100*  CO2  --  28  GLUCOSE  --  203*  BUN  --  8  CREATININE  --  0.59  CALCIUM  --  8.9  MG 2.0  --    No results for input(s): AST, ALT, ALKPHOS, BILITOT, PROT, ALBUMIN in the last 72 hours. No results for input(s): WBC, NEUTROABS, HGB, HCT, MCV, PLT in the last 72 hours.  Recent Labs  01/27/16 0808 01/27/16 1357  TROPONINI <0.03 <0.03   Invalid input(s): POCBNP No results for input(s): HGBA1C in the last 72 hours.   Radiology/Studies:  Dg Chest 2 View  01/26/2016  CLINICAL DATA:  Constant moderate left-sided and substernal chest pain radiates into left arm starting 90 minutes ago. EXAM: CHEST  2 VIEW COMPARISON:  06/01/2015 FINDINGS: The lungs are clear wiithout focal pneumonia, edema, pneumothorax or pleural effusion. Chronic atelectasis or scarring at the  bases with haziness on the final film due to overlying soft tissue and breast prostheses. Cardiopericardial silhouette is at upper limits of normal for size. The visualized bony structures of the thorax are intact. Telemetry leads overlie the chest. IMPRESSION: No active cardiopulmonary disease. Electronically Signed   By: Misty Stanley M.D.   On: 01/26/2016 20:14     Assessment and Plan  54M with CAD s/p DES to LAD in 2007, HLD, uncontrolled DM, HTN, anxiety, depression, nephrolithiasis admitted with chest pain and accelerated HTN. Troponins neg x 3, d-dimer negative. 2D echo 01/27/16: EF 60-65%, no RWMA, normal diastolic parameters. LHC 7/17: continued patency of LAD  stent, otherwise mild nonobstructive CAD.   1. Chest pain - suspected noncardiac, ? r/t uncontrolled BP in the setting of worsening stress. Resolved.  2. CAD - stable as above. Continue ASA, BB, statin.  3. HTN - BP improved. Consider changing hydralazine to a) either TID dosing to encourage compliance or b) ACEI/ARB given DM. She denies ever being on one before. I do not see this listed as an allergy in her chart.  4. Borderline low O2 sats - further workup per IM. Does not appear to be cardiac in etiology at this time. She reports longstanding hx of secondhand smoke exposure. Has OP f/u with Dr. Lamonte Sakai 8/8.  5. HLD/hypertriglyceridemia - IM has added Lovaza.  F/u preliminarily scheduled 8/18 with Truitt Merle.  Signed, Melina Copa PA-C Pager: [phone] Patient seen and examined and history reviewed. Agree with above findings and plan. Patient dose have some soreness at radial access site without hematoma. BP improved but still labile. I agree that I would switch hydralazine to a tid dosing schedule to improve compliance. Would consider adding an ACEi/ARB as outpatient if BP remains elevated. Clear for DC today from our standpoint.   Destiny Ballard, [place] 01/30/2016 10:27 AM

## 2016-01-31 ENCOUNTER — Telehealth: Payer: Self-pay | Admitting: Behavioral Health

## 2016-01-31 NOTE — Telephone Encounter (Signed)
Transition Care Management Follow-up Telephone Call  PCP: Lamar Blinks, MD  Admit date: 01/26/2016 Discharge date: 01/30/2016  Recommendations for Outpatient Follow-up:  Repeat BMET to follow electrolytes and renal function trend Close follow up to patient CBG's and further adjustment on her hypoglycemic regimen as needed  Please follow lipid panel and adjust statins and/or initiate fenofibrate as needed for elevated TG  Discharge Diagnoses:  Active Problems:  Obesity (BMI 30-39.9)  GERD  Type 2 diabetes mellitus without complication, without long-term current use of insulin (HCC)  Hypertensive heart disease without CHF  HLD (hyperlipidemia)  Hypokalemia  Depression with anxiety  CAD (coronary artery disease)  Pain in the chest  Chest pain  High blood triglycerides  Essential hypertension  Discharge Condition: stable and improved. Discharge home with instructions to follow up with PCP in 2 weeks and with cardiology service as an outpatient (office will contact patient with appointment details)   How have you been since you were released from the hospital? Patient stated, "I'm doing ok".   Do you understand why you were in the hospital? yes   Do you understand the discharge instructions? yes   Where were you discharged to? Home   Items Reviewed:   Medications reviewed: yes  Allergies reviewed: yes  Dietary changes reviewed: yes, heart healthy & modified carb diet.  Referrals reviewed: yes; Discharge home with instructions to follow up with PCP in 2 weeks and with cardiology service as an outpatient (office will contact patient with appointment details)   Functional Questionnaire:   Activities of Daily Living (ADLs):   She states they are independent in the following: ambulation, bathing and hygiene, feeding, continence, grooming, toileting and dressing States they require assistance with the following: None   Any transportation  issues/concerns?: no   Any patient concerns? no   Confirmed importance and date/time of follow-up visits scheduled yes, 02/07/16 at 11:30 AM.  Provider Appointment booked with Dr. Lorelei Pont.  Confirmed with patient if condition begins to worsen call PCP or go to the ER.  Patient was given the office number and encouraged to call back with question or concerns.  : yes

## 2016-02-07 ENCOUNTER — Encounter: Payer: Self-pay | Admitting: Family Medicine

## 2016-02-07 ENCOUNTER — Ambulatory Visit (INDEPENDENT_AMBULATORY_CARE_PROVIDER_SITE_OTHER): Payer: Medicare Other | Admitting: Family Medicine

## 2016-02-07 VITALS — BP 139/91 | HR 86 | Temp 98.2°F | Ht 70.0 in | Wt 186.8 lb

## 2016-02-07 DIAGNOSIS — R053 Chronic cough: Secondary | ICD-10-CM

## 2016-02-07 DIAGNOSIS — R05 Cough: Secondary | ICD-10-CM

## 2016-02-07 DIAGNOSIS — R0789 Other chest pain: Secondary | ICD-10-CM | POA: Diagnosis not present

## 2016-02-07 DIAGNOSIS — E1165 Type 2 diabetes mellitus with hyperglycemia: Secondary | ICD-10-CM

## 2016-02-07 DIAGNOSIS — F4323 Adjustment disorder with mixed anxiety and depressed mood: Secondary | ICD-10-CM | POA: Diagnosis not present

## 2016-02-07 DIAGNOSIS — Z09 Encounter for follow-up examination after completed treatment for conditions other than malignant neoplasm: Secondary | ICD-10-CM | POA: Diagnosis not present

## 2016-02-07 DIAGNOSIS — R600 Localized edema: Secondary | ICD-10-CM

## 2016-02-07 DIAGNOSIS — IMO0002 Reserved for concepts with insufficient information to code with codable children: Secondary | ICD-10-CM

## 2016-02-07 DIAGNOSIS — E118 Type 2 diabetes mellitus with unspecified complications: Secondary | ICD-10-CM

## 2016-02-07 DIAGNOSIS — Z794 Long term (current) use of insulin: Secondary | ICD-10-CM

## 2016-02-07 LAB — BASIC METABOLIC PANEL
BUN: 11 mg/dL (ref 6–23)
CO2: 32 mEq/L (ref 19–32)
Calcium: 9.6 mg/dL (ref 8.4–10.5)
Chloride: 99 mEq/L (ref 96–112)
Creatinine, Ser: 0.63 mg/dL (ref 0.40–1.20)
GFR: 100.27 mL/min (ref >60.00)
Glucose, Bld: 146 mg/dL — ABNORMAL HIGH (ref 70–99)
Potassium: 4 mEq/L (ref 3.5–5.1)
Sodium: 138 mEq/L (ref 135–145)

## 2016-02-07 MED ORDER — BENZONATATE 100 MG PO CAPS: 100.0000 mg | ORAL_CAPSULE | Freq: Three times a day (TID) | ORAL | 1 refills | Status: DC | PRN

## 2016-02-07 MED ORDER — CLONAZEPAM 0.5 MG PO TABS: 0.2500 mg | ORAL_TABLET | Freq: Two times a day (BID) | ORAL | 1 refills | Status: DC | PRN

## 2016-02-07 MED ORDER — NITROGLYCERIN 0.4 MG SL SUBL: 0.4000 mg | SUBLINGUAL_TABLET | SUBLINGUAL | 3 refills | Status: DC | PRN

## 2016-02-07 MED ORDER — FUROSEMIDE 20 MG PO TABS: ORAL_TABLET | ORAL | 1 refills | Status: DC

## 2016-02-07 NOTE — Patient Instructions (Signed)
It was good to see you today!  Please schedule a follow-up visit with Dr. Loanne Drilling asap For the time being, we are going to titrate up your insulin dose.  Please check your fasting blood sugar in the mornings. If your sugar is over 150, increase your lantus by 2 units every 2 days. If you get to 30 units and have not yet seen Dr Loanne Drilling please let me know I refilled your nitrostat to use in case of chest pain, and also your tessalon perles for cough  Use a lasix pill (fluid pill) if needed for swelling. Generally we do not want you using this more than 2-3x a week; if you are using it more often we will just need to monitor your electrolytes.    I gave you an rx for klonopin to use for anxiety. This medication can be sedating and habit forming; use it as little as possible, do not drive after taking it, and do not combine it with medications for itching or vertigo  Take care and I will be in touch with your labs Please plan to see me in 4 months for a recheck

## 2016-02-07 NOTE — Progress Notes (Addendum)
[place] [facility] [address], [place], [place] [address] [phone]  Date:  02/07/2016   Name:  Destiny Ballard   DOB:  10-18-1950   MRN:  [id number]  PCP:  Lamar Blinks, MD    Chief Complaint: No chief complaint on file.   History of Present Illness:  Destiny Ballard is a 65 y.o. very pleasant female patient who presents with the following:  Here today for a hospital follow-up visit.  She was admitted from 7/14 to 7/18.  Per discharge instructions: Needs a repeat BMP, follow-up lipids and DM. She was admitted for CP, had a cath on 7/17 that showed non- obstructive CAD. Recommended aggressive risk factor modification, continue asa, statin, BB. Insulin was started for her uncontrolled glucose and she is still using 10 units daily  She states that she is feeling ok.  She is using metformin and also insulin for her DM.  She has been checking her glucose- it may run 200-300 hundred, highest 400.    She notes a lot of stress at home- her daughter lives in the upstairs part of their home, rent free.  However she is now having other person live with her without asking permission of her mom and dad, the homeowners.  This is very stressful to [name], and she anticipates this getting worse as they plan to evict her. She wonders if she can have "something for my nerves" as she is feeling really anxious and having a hard time sleeping She has noted some pain in her sciatic nerve that runs down the left side of her back into the left thigh.    She needs nitrotabs and some tessalon perles for her cough  She has not seen endocrinolgoy in several months but agrees to schedule an appt to follow-up her DM She will see her pulmonologist Dr. Malvin Johns 8/8 Cardiology appt 8/18  Wt Readings from Last 3 Encounters:  02/07/16 186 lb 12.8 oz (84.7 kg)  01/30/16 183 lb 12.8 oz (83.4 kg)  12/07/15 180 lb (81.6 kg)   She has noted intermittetn edema  of her feet and ankles over the last 3-4 months- she often feels like her shoes are a bit tight.  The swelling will develop when she is on her feet all day.  This is worst if she is on a car trip or otherwise sitting still for a long time, gets better overnight. She does not have any known history of CHF.   Recent EF was 60- 65% while in pt.   Her DM has been managed by Dr. Loanne Drilling most recently    Patient Active Problem List   Diagnosis Date Noted  . Essential hypertension 01/30/2016  . High blood triglycerides   . Chest pain 01/28/2016  . Hypokalemia 01/27/2016  . Depression with anxiety   . CAD (coronary artery disease)   . Pain in the chest   . Hypertensive heart disease without CHF   . HLD (hyperlipidemia)   . Type 2 diabetes mellitus without complication, without long-term current use of insulin ([place])   . OSA (obstructive sleep apnea) 05/06/2014  . Chest pain 12/10/2011  . Obesity (BMI 30-39.9)   . GERD 06/28/2008    Past Medical History:  Diagnosis Date  . Anxiety    Prior suicide attempt  . CAD (coronary artery disease)    a) s/p DES to LAD 07/2005 b) Last Myoview low risk 11/2011 showing small fixed apical  perfusion defect (prior MI vs attenuation) but no ischemia - normal EF.  [name] Cervical spondylosis   . Coronary atherosclerosis 06/28/2008  . Depression   . Depression with anxiety   . Diabetes mellitus without complication ([place])   . GERD (gastroesophageal reflux disease)   . Hyperlipidemia   . Hypertension   . Insulin resistance   . Iron deficiency anemia   . Obesity     Past Surgical History:  Procedure Laterality Date  . BREAST ENHANCEMENT SURGERY    . CARDIAC CATHETERIZATION  06/17/2007   NORMAL. EF 60%  . CARDIAC CATHETERIZATION N/A 01/29/2016   Procedure: Left Heart Cath and Coronary Angiography;  Surgeon: Sherren Mocha, MD;  Location: [facility];  Service: Cardiovascular;  Laterality: N/A;  . CERVICAL SPONDYLOSIS     SINGLE LEVEL FUSION  .  CHILDBIRTH     X3  . CORONARY STENT PLACEMENT  07/2005   LEFT ANTERIOR DESCENDING  . FOREARM FRACTURE SURGERY  2010   hand and shoulder   . INCISION AND DRAINAGE BREAST ABSCESS  01/05/2012      . INCISION AND DRAINAGE PERIRECTAL ABSCESS N/A 02/18/2014   Procedure: IRRIGATION AND DEBRIDEMENT PERIRECTAL ABSCESS;  Surgeon: Pedro Earls, MD;  Location: [facility];  Service: General;  Laterality: N/A;  . LUMBAR LAMINECTOMY    . ROTATOR CUFF REPAIR     bilaterla  . TONSILLECTOMY AND ADENOIDECTOMY    . TUBAL LIGATION      Social History  Substance Use Topics  . Smoking status: Never Smoker  . Smokeless tobacco: Never Used  . Alcohol use Yes     Comment: occ    Family History  Problem Relation Age of Onset  . Heart attack Mother   . Diabetes Mother   . Lung cancer Mother   . Asthma Mother   . Heart disease Mother   . Suicidality Father     "killed himself"  . Allergies Other     all family--seasonal allergies  . Asthma Daughter     x 2  . Cancer Daughter     pre-cancerous polyp  . Diabetes Sister   . Cancer Sister   . Cervical cancer Daughter     cervical     Allergies  Allergen Reactions  . Prednisone Other (See Comments)    REACTION: mood swings, nightmares. "Shot doesn't bother me, reaction is just with the pill" she states she has had the steroid injections before. From our records methylprednisone was given to her in 2013 without any complications.  . Nitroglycerin     Severe headache    Medication list has been reviewed and updated.  Current Outpatient Prescriptions on File Prior to Visit  Medication Sig Dispense Refill  . amLODipine (NORVASC) 10 MG tablet TAKE 1 TABLET BY MOUTH EVERY DAY 90 tablet 1  . aspirin EC 81 MG tablet Take 81 mg by mouth daily.    . bromocriptine (PARLODEL) 2.5 MG tablet TAKE 1/2 TABLET (1.25 MG TOTAL) BY MOUTH AT BEDTIME. 45 tablet 1  . cetirizine (ZYRTEC) 10 MG tablet TAKE 1 TABLET BY MOUTH EVERY DAY 30 tablet 7  . Cholecalciferol  (VITAMIN D3) 1000 UNITS CAPS Take 1,000 Units by mouth daily.     . CRESTOR 20 MG tablet TAKE 1 TABLET BY MOUTH EVERY DAY 90 tablet 3  . Cyanocobalamin (VITAMIN B 12 PO) Take 500 mg by mouth 2 (two) times daily.    . diphenhydramine-acetaminophen (TYLENOL PM) 25-500 MG TABS Take 1 tablet  by mouth at bedtime.     . fexofenadine (ALLEGRA) 180 MG tablet Take 180 mg by mouth 2 (two) times daily.     [name] FLUoxetine (PROZAC) 40 MG capsule Take 1 capsule (40 mg total) by mouth daily. 90 capsule 3  . gabapentin (NEURONTIN) 300 MG capsule Take 300 mg by mouth 2 (two) times daily.    . hydrALAZINE (APRESOLINE) 25 MG tablet Take 2 tablets (50 mg total) by mouth 3 (three) times daily. 90 tablet 1  . hydrOXYzine (ATARAX/VISTARIL) 25 MG tablet TAKE 1/2 TO 1 TABLET AT BEDTIME AS NEEDED FOR ITCHING 30 tablet 0  . Insulin Detemir (LEVEMIR FLEXTOUCH) 100 UNIT/ML Pen Inject 10 Units into the skin daily. 15 mL 3  . Insulin Pen Needle 31G X 5 MM MISC Use to inject insulin as instructed once a day 100 each 1  . meclizine (ANTIVERT) 12.5 MG tablet Take 12.5 mg by mouth 3 (three) times daily as needed for dizziness.    . metFORMIN (GLUCOPHAGE-XR) 500 MG 24 hr tablet Take 2 tablets (1,000 mg total) by mouth daily with breakfast. (Patient taking differently: Take 500 mg by mouth 2 (two) times daily. ) 180 tablet 3  . metoprolol succinate (TOPROL-XL) 100 MG 24 hr tablet TAKE 1 TABLET BY MOUTH EVERY DAY 90 tablet 0  . nitroGLYCERIN (NITROSTAT) 0.4 MG SL tablet Place 0.4 mg under the tongue every 5 (five) minutes as needed.    [name] omega-3 acid ethyl esters (LOVAZA) 1 g capsule Take 1 capsule (1 g total) by mouth 2 (two) times daily. 60 capsule 1  . pantoprazole (PROTONIX) 40 MG tablet Take 1 tablet (40 mg total) by mouth daily. 30 tablet 3  . repaglinide (PRANDIN) 2 MG tablet Take 1 tablet (2 mg total) by mouth 3 (three) times daily before meals. 90 tablet 11  . vitamin E 400 UNIT capsule Take 400 Units by mouth daily.     No  current facility-administered medications on file prior to visit.     Review of Systems:  As per HPI- otherwise negative.   Physical Examination: Vitals:   02/07/16 1123  BP: (!) 139/91  Pulse: 86  Temp: 98.2 F (36.8 C)    Vitals:   02/07/16 1123  Weight: 186 lb 12.8 oz (84.7 kg)  Height: 5\' 10"  (1.778 m)   Ideal Body Weight:    GEN: WDWN, NAD, Non-toxic, A & O x 3, obese, looks well HEENT: Atraumatic, Normocephalic. Neck supple. No masses, No LAD.  Bilateral TM wnl, oropharynx normal.  PEERL,EOMI.   Ears and Nose: No external deformity. CV: RRR, No M/G/R. No JVD. No thrill. No extra heart sounds. PULM: CTA B, no wheezes, crackles, rhonchi. No retractions. No resp. distress. No accessory muscle use. ABD: S, NT, ND, +BS. No rebound. No HSM. EXTR: No c/c/.  Trace edema of both ankles, stops at distal tib NEURO Normal gait.  PSYCH: Normally interactive. Conversant. Not depressed or anxious appearing.  Calm demeanor.    Assessment and Plan: Other chest pain - Plan: nitroGLYCERIN (NITROSTAT) 0.4 MG SL tablet, Basic metabolic panel  Chronic cough - Plan: benzonatate (TESSALON PERLES) 100 MG capsule  Pedal edema - Plan: furosemide (LASIX) 20 MG tablet, Basic metabolic panel  Adjustment disorder with mixed anxiety and depressed mood - Plan: clonazePAM (KLONOPIN) 0.5 MG tablet  Hospital discharge follow-up - Plan: Basic metabolic panel  Uncontrolled type 2 diabetes mellitus with complication, with long-term current use of insulin ([facility])  Here today to follow-up from  recent hospital stay. She feels that she is doing well, back to her baseline.  Request a refill of some of her medications- she confirms that her "allergy" to nitroglycerin is only a HA which she recognizes is a known SE and states she does not know why this got listed as an allergy.  She does not have any history of rash, hives, SOB with this med.  We agreed that I will remove this allergy from her med  list  Gave rx for lasix to use on occasion for pedal edema and discussed monitoring needs if she uses this daily/ nearly daily Tessalon perles for her chronic cough Low dose of clonazepam to use sparingly as needed for anxiety. Discussed risk of sedation and dependence.  Will plan to titrate up on her insulin gradually  Signed Lamar Blinks, MD  Received her labs and called 7/27 to discuss, not home will try back  Results for orders placed or performed in visit on [id number]  Basic metabolic panel  Result Value Ref Range   Sodium 138 135 - 145 mEq/L   Potassium 4.0 3.5 - 5.1 mEq/L   Chloride 99 96 - 112 mEq/L   CO2 32 19 - 32 mEq/L   Glucose, Bld 146 (H) 70 - 99 mg/dL   BUN 11 6 - 23 mg/dL   Creatinine, Ser 0.63 0.40 - 1.20 mg/dL   Calcium 9.6 8.4 - 10.5 mg/dL   GFR 100.27 >60.00 mL/min    Lab Results  Component Value Date   HGBA1C 9.1 (H) 01/27/2016   Called her back on 7/28- went over labs. She has increased her insulin to 12 units and is doing well with this, no low glucose readings

## 2016-02-20 ENCOUNTER — Encounter: Payer: Self-pay | Admitting: Emergency Medicine

## 2016-02-20 ENCOUNTER — Ambulatory Visit (HOSPITAL_BASED_OUTPATIENT_CLINIC_OR_DEPARTMENT_OTHER): Payer: Medicare Other | Attending: Emergency Medicine | Admitting: Pulmonary Disease

## 2016-02-20 ENCOUNTER — Ambulatory Visit (INDEPENDENT_AMBULATORY_CARE_PROVIDER_SITE_OTHER): Payer: Medicare Other | Admitting: Emergency Medicine

## 2016-02-20 ENCOUNTER — Ambulatory Visit: Payer: Medicare Other | Admitting: Emergency Medicine

## 2016-02-20 ENCOUNTER — Other Ambulatory Visit (HOSPITAL_BASED_OUTPATIENT_CLINIC_OR_DEPARTMENT_OTHER): Payer: Self-pay

## 2016-02-20 VITALS — BP 140/80 | HR 96 | Ht 60.0 in | Wt 186.0 lb

## 2016-02-20 VITALS — Ht 60.0 in | Wt 186.0 lb

## 2016-02-20 DIAGNOSIS — Z6836 Body mass index (BMI) 36.0-36.9, adult: Secondary | ICD-10-CM | POA: Diagnosis not present

## 2016-02-20 DIAGNOSIS — R053 Chronic cough: Secondary | ICD-10-CM

## 2016-02-20 DIAGNOSIS — E119 Type 2 diabetes mellitus without complications: Secondary | ICD-10-CM | POA: Diagnosis not present

## 2016-02-20 DIAGNOSIS — R0683 Snoring: Secondary | ICD-10-CM | POA: Insufficient documentation

## 2016-02-20 DIAGNOSIS — G4719 Other hypersomnia: Secondary | ICD-10-CM | POA: Insufficient documentation

## 2016-02-20 DIAGNOSIS — E669 Obesity, unspecified: Secondary | ICD-10-CM | POA: Diagnosis not present

## 2016-02-20 DIAGNOSIS — G4736 Sleep related hypoventilation in conditions classified elsewhere: Secondary | ICD-10-CM | POA: Diagnosis not present

## 2016-02-20 DIAGNOSIS — R5383 Other fatigue: Secondary | ICD-10-CM | POA: Insufficient documentation

## 2016-02-20 DIAGNOSIS — R05 Cough: Secondary | ICD-10-CM | POA: Insufficient documentation

## 2016-02-20 DIAGNOSIS — R0609 Other forms of dyspnea: Secondary | ICD-10-CM

## 2016-02-20 DIAGNOSIS — G4733 Obstructive sleep apnea (adult) (pediatric): Secondary | ICD-10-CM | POA: Insufficient documentation

## 2016-02-20 DIAGNOSIS — R0600 Dyspnea, unspecified: Secondary | ICD-10-CM | POA: Diagnosis not present

## 2016-02-20 NOTE — Assessment & Plan Note (Signed)
With likely contributions from GERD and allergic rhinitis. She's currently on therapy for both. We will reassess for any possible changes in her regimen after her pulmonary function testing has been completed.

## 2016-02-20 NOTE — Assessment & Plan Note (Signed)
Suspected obstructive sleep apnea. She has never been treated, states that she's not had a sleep study. We will obtain a PSG split night study and discuss initiating treatment depending on the results.

## 2016-02-20 NOTE — Assessment & Plan Note (Signed)
Suspect that this is at least in part due to deconditioning and obesity.she does have previous PFT that suggested mixed obstruction and restriction. I would like to repeat her pulmonary function testing assess for possible obstructive disease that we can treat with bronchodilators.

## 2016-02-20 NOTE — Patient Instructions (Signed)
We will perform pulmonary function testing at your next visit to compare with 2014 We will schedule a sleep study.  Continue your pantoprazole, allegra and zyrtec as you are taking them  Follow with Dr Lamonte Sakai in 2 months or sooner if you have any problems. We will review your testing at that visit.

## 2016-02-20 NOTE — Progress Notes (Signed)
Subjective:    Patient ID: Destiny Ballard, female    DOB: 11/25/1950, 65 y.o.   MRN: [id number]  HPI 65 year old woman, never smoker, with a history of coronary disease, depression, diabetes, GERD, allergic rhinitis. She's been seen in our office previously for evaluation of possible obstructive sleep apnea - never had her PSG. She is also been seen for chronic coughing. She had pulmonary function testing in June 2014 that I have personally reviewed and were consistent with either restriction or mixed disease.  She continues to have every day cough, non-productive.  Continues to have some GERD, is on protonix, still takes Tums. She is on allegra and zyrtec. She is referred back today following recent hospitalization to further eval her dyspnea, possible OSA and cough.    Review of Systems As per HPI  Past Medical History:  Diagnosis Date  . Anxiety    Prior suicide attempt  . CAD (coronary artery disease)    a) s/p DES to LAD 07/2005 b) Last Myoview low risk 11/2011 showing small fixed apical perfusion defect (prior MI vs attenuation) but no ischemia - normal EF.  [name] Cervical spondylosis   . Coronary atherosclerosis 06/28/2008  . Depression   . Depression with anxiety   . Diabetes mellitus without complication ([place])   . GERD (gastroesophageal reflux disease)   . Hyperlipidemia   . Hypertension   . Insulin resistance   . Iron deficiency anemia   . Obesity      Family History  Problem Relation Age of Onset  . Heart attack Mother   . Diabetes Mother   . Lung cancer Mother   . Asthma Mother   . Heart disease Mother   . Suicidality Father     "killed himself"  . Asthma Daughter     x 2  . Cancer Daughter     pre-cancerous polyp  . Diabetes Sister   . Cancer Sister   . Cervical cancer Daughter     cervical   . Allergies Other     all family--seasonal allergies     Social History   Social History  . Marital status: Married    Spouse name: Lake Bells  . Number of  children: 3  . Years of education: 12   Occupational History  . retired from [employer]     11/2012   Social History Main Topics  . Smoking status: Never Smoker  . Smokeless tobacco: Never Used  . Alcohol use Yes     Comment: occ  . Drug use: No  . Sexual activity: Yes    Partners: Male    Birth control/ protection: None   Other Topics Concern  . Not on file   Social History Narrative   Lives with her husband.  Their eldest daughter lives upstairs.     Allergies  Allergen Reactions  . Prednisone Other (See Comments)    REACTION: mood swings, nightmares. "Shot doesn't bother me, reaction is just with the pill" she states she has had the steroid injections before. From our records methylprednisone was given to her in 2013 without any complications.  . Nitroglycerin     Severe headache only- known SE. Pt is able to use this if needed      Outpatient Medications Prior to Visit  Medication Sig Dispense Refill  . amLODipine (NORVASC) 10 MG tablet TAKE 1 TABLET BY MOUTH EVERY DAY 90 tablet 1  . aspirin EC 81 MG tablet Take 81 mg by mouth daily.    [name]  benzonatate (TESSALON PERLES) 100 MG capsule Take 1 capsule (100 mg total) by mouth 3 (three) times daily as needed for cough. 60 capsule 1  . bromocriptine (PARLODEL) 2.5 MG tablet TAKE 1/2 TABLET (1.25 MG TOTAL) BY MOUTH AT BEDTIME. 45 tablet 1  . cetirizine (ZYRTEC) 10 MG tablet TAKE 1 TABLET BY MOUTH EVERY DAY 30 tablet 7  . Cholecalciferol (VITAMIN D3) 1000 UNITS CAPS Take 1,000 Units by mouth daily.     . clonazePAM (KLONOPIN) 0.5 MG tablet Take 0.5 tablets (0.25 mg total) by mouth 2 (two) times daily as needed for anxiety. 20 tablet 1  . CRESTOR 20 MG tablet TAKE 1 TABLET BY MOUTH EVERY DAY 90 tablet 3  . Cyanocobalamin (VITAMIN B 12 PO) Take 500 mg by mouth 2 (two) times daily.    . diphenhydramine-acetaminophen (TYLENOL PM) 25-500 MG TABS Take 1 tablet by mouth at bedtime.     . fexofenadine (ALLEGRA) 180 MG tablet Take 180 mg by  mouth 2 (two) times daily.     [name] FLUoxetine (PROZAC) 40 MG capsule Take 1 capsule (40 mg total) by mouth daily. 90 capsule 3  . furosemide (LASIX) 20 MG tablet Take one tablet daily if needed for leg swelling 30 tablet 1  . gabapentin (NEURONTIN) 300 MG capsule Take 300 mg by mouth 2 (two) times daily.    . hydrALAZINE (APRESOLINE) 25 MG tablet Take 2 tablets (50 mg total) by mouth 3 (three) times daily. 90 tablet 1  . hydrOXYzine (ATARAX/VISTARIL) 25 MG tablet TAKE 1/2 TO 1 TABLET AT BEDTIME AS NEEDED FOR ITCHING 30 tablet 0  . Insulin Detemir (LEVEMIR FLEXTOUCH) 100 UNIT/ML Pen Inject 10 Units into the skin daily. 15 mL 3  . Insulin Pen Needle 31G X 5 MM MISC Use to inject insulin as instructed once a day 100 each 1  . meclizine (ANTIVERT) 12.5 MG tablet Take 12.5 mg by mouth 3 (three) times daily as needed for dizziness.    . metFORMIN (GLUCOPHAGE-XR) 500 MG 24 hr tablet Take 2 tablets (1,000 mg total) by mouth daily with breakfast. (Patient taking differently: Take 500 mg by mouth 2 (two) times daily. ) 180 tablet 3  . metoprolol succinate (TOPROL-XL) 100 MG 24 hr tablet TAKE 1 TABLET BY MOUTH EVERY DAY 90 tablet 0  . nitroGLYCERIN (NITROSTAT) 0.4 MG SL tablet Place 0.4 mg under the tongue every 5 (five) minutes as needed.    . nitroGLYCERIN (NITROSTAT) 0.4 MG SL tablet Place 1 tablet (0.4 mg total) under the tongue every 5 (five) minutes as needed for chest pain. 50 tablet 3  . omega-3 acid ethyl esters (LOVAZA) 1 g capsule Take 1 capsule (1 g total) by mouth 2 (two) times daily. 60 capsule 1  . pantoprazole (PROTONIX) 40 MG tablet Take 1 tablet (40 mg total) by mouth daily. 30 tablet 3  . repaglinide (PRANDIN) 2 MG tablet Take 1 tablet (2 mg total) by mouth 3 (three) times daily before meals. 90 tablet 11  . vitamin E 400 UNIT capsule Take 400 Units by mouth daily.     No facility-administered medications prior to visit.         Objective:   Physical Exam Vitals:   02/20/16 1141  BP:  140/80  Pulse: 96  SpO2: 93%  Weight: 186 lb (84.4 kg)  Height: 5' (1.524 m)   Gen: Pleasant, overwt woman, in no distress,  normal affect  ENT: No lesions,  mouth clear,  oropharynx clear, no postnasal  drip, M4 airway  Neck: No JVD, no TMG, no carotid bruits  Lungs: No use of accessory muscles, clear without rales or rhonchi  Cardiovascular: RRR, heart sounds normal, no murmur or gallops, no peripheral edema  Musculoskeletal: No deformities, no cyanosis or clubbing  Neuro: alert, non focal  Skin: Warm, no lesions or rashes       Assessment & Plan:  OSA (obstructive sleep apnea) Suspected obstructive sleep apnea. She has never been treated, states that she's not had a sleep study. We will obtain a PSG split night study and discuss initiating treatment depending on the results.  Dyspnea on exertion Suspect that this is at least in part due to deconditioning and obesity.she does have previous PFT that suggested mixed obstruction and restriction. I would like to repeat her pulmonary function testing assess for possible obstructive disease that we can treat with bronchodilators.  Chronic cough With likely contributions from GERD and allergic rhinitis. She's currently on therapy for both. We will reassess for any possible changes in her regimen after her pulmonary function testing has been completed.  Baltazar Apo, MD, PhD 02/20/2016, 12:10 PM [facility] [phone] or if no answer [phone]

## 2016-02-21 NOTE — Addendum Note (Signed)
Addended by: Mathis Dad on: 02/21/2016 02:42 PM   Modules accepted: Orders

## 2016-02-24 ENCOUNTER — Other Ambulatory Visit: Payer: Self-pay | Admitting: Family Medicine

## 2016-02-27 DIAGNOSIS — G4733 Obstructive sleep apnea (adult) (pediatric): Secondary | ICD-10-CM | POA: Diagnosis not present

## 2016-02-27 NOTE — Progress Notes (Signed)
Patient Name: Destiny Ballard, Destiny Ballard Date: 02/20/2016 Gender: Female D.O.B: Feb 15, 1951 Age (years): 64 Referring Provider: Baltazar Apo Height (inches): 72 Interpreting Physician: Kara Mead MD, ABSM Weight (lbs): 186 RPSGT: Jonna Coup BMI: 36 MRN: [id number] Neck Size: 16.50   CLINICAL INFORMATION Sleep Study Type: NPSG Indication for sleep study: Diabetes, Excessive Daytime Sleepiness, Fatigue, Obesity, OSA, Snoring Epworth Sleepiness Score: 9   SLEEP STUDY TECHNIQUE As per the AASM Manual for the Scoring of Sleep and Associated Events v2.3 (April 2016) with a hypopnea requiring 4% desaturations. The channels recorded and monitored were frontal, central and occipital EEG, electrooculogram (EOG), submentalis EMG (chin), nasal and oral airflow, thoracic and abdominal wall motion, anterior tibialis EMG, snore microphone, electrocardiogram, and pulse oximetry.   SLEEP ARCHITECTURE The study was initiated at 9:50:33 PM and ended at 4:39:54 AM. Sleep onset time was 114.2 minutes and the sleep efficiency was 61.1%. The total sleep time was 250.0 minutes. Stage REM latency was 71.0 minutes. The patient spent 5.60% of the night in stage N1 sleep, 48.60% in stage N2 sleep, 0.20% in stage N3 and 45.60% in REM. Alpha intrusion was absent. Supine sleep was 0.00%.   RESPIRATORY PARAMETERS The overall apnea/hypopnea index (AHI) was 35.5 per hour. There were 3 total apneas, including 3 obstructive, 0 central and 0 mixed apneas. There were 145 hypopneas and 0 RERAs. The AHI during Stage REM sleep was 57.9 per hour. AHI while supine was N/A per hour. The mean oxygen saturation was 86.55%. The minimum SpO2 during sleep was 63.00%. Loud snoring was noted during this study.   CARDIAC DATA The 2 lead EKG demonstrated sinus rhythm. The mean heart rate was N/A beats per minute. Other EKG findings include: None.   LEG MOVEMENT DATA The total PLMS were 14 with a resulting PLMS index of  3.36. Associated arousal with leg movement index was 1.2 .   IMPRESSIONS - Severe obstructive sleep apnea occurred during this study (AHI = 35.5/h) Especially during REM sleep. - No significant central sleep apnea occurred during this study (CAI = 0.0/h). - Severe oxygen desaturation was noted during this study (Min O2 = 63.00%). - The patient snored with Loud snoring volume. - No cardiac abnormalities were noted during this study. - Clinically significant periodic limb movements did not occur during sleep. No significant associated arousals.   DIAGNOSIS - Obstructive Sleep Apnea (327.23 [G47.33 ICD-10]) - Nocturnal Hypoxemia (327.26 [G47.36 ICD-10])   RECOMMENDATIONS - Therapeutic CPAP titration to determine optimal pressure required to alleviate sleep disordered breathing. - Avoid alcohol, sedatives and other CNS depressants that may worsen sleep apnea and disrupt normal sleep architecture. - Sleep hygiene should be reviewed to assess factors that may improve sleep quality. - Weight management and regular exercise should be initiated or continued if appropriate.    Kara Mead MD. [facility]. [facility]   02/27/2016

## 2016-02-27 NOTE — Procedures (Signed)
Patient Name: Destiny Ballard, Destiny Ballard Date: 02/20/2016 Gender: Female D.O.B: 1951/07/13 Age (years): 64 Referring Provider: Baltazar Apo Height (inches): 70 Interpreting Physician: Kara Mead MD, ABSM Weight (lbs): 186 RPSGT: Jonna Coup BMI: 36 MRN: [id number] Neck Size: 16.50   CLINICAL INFORMATION Sleep Study Type: NPSG Indication for sleep study: Diabetes, Excessive Daytime Sleepiness, Fatigue, Obesity, OSA, Snoring Epworth Sleepiness Score: 9   SLEEP STUDY TECHNIQUE As per the AASM Manual for the Scoring of Sleep and Associated Events v2.3 (April 2016) with a hypopnea requiring 4% desaturations. The channels recorded and monitored were frontal, central and occipital EEG, electrooculogram (EOG), submentalis EMG (chin), nasal and oral airflow, thoracic and abdominal wall motion, anterior tibialis EMG, snore microphone, electrocardiogram, and pulse oximetry.   SLEEP ARCHITECTURE The study was initiated at 9:50:33 PM and ended at 4:39:54 AM. Sleep onset time was 114.2 minutes and the sleep efficiency was 61.1%. The total sleep time was 250.0 minutes. Stage REM latency was 71.0 minutes. The patient spent 5.60% of the night in stage N1 sleep, 48.60% in stage N2 sleep, 0.20% in stage N3 and 45.60% in REM. Alpha intrusion was absent. Supine sleep was 0.00%.   RESPIRATORY PARAMETERS The overall apnea/hypopnea index (AHI) was 35.5 per hour. There were 3 total apneas, including 3 obstructive, 0 central and 0 mixed apneas. There were 145 hypopneas and 0 RERAs. The AHI during Stage REM sleep was 57.9 per hour. AHI while supine was N/A per hour. The mean oxygen saturation was 86.55%. The minimum SpO2 during sleep was 63.00%. Loud snoring was noted during this study.   CARDIAC DATA The 2 lead EKG demonstrated sinus rhythm. The mean heart rate was N/A beats per minute. Other EKG findings include: None.   LEG MOVEMENT DATA The total PLMS were 14 with a resulting PLMS  index of 3.36. Associated arousal with leg movement index was 1.2 .   IMPRESSIONS - Severe obstructive sleep apnea occurred during this study (AHI = 35.5/h) Especially during REM sleep. - No significant central sleep apnea occurred during this study (CAI = 0.0/h). - Severe oxygen desaturation was noted during this study (Min O2 = 63.00%). - The patient snored with Loud snoring volume. - No cardiac abnormalities were noted during this study. - Clinically significant periodic limb movements did not occur during sleep. No significant associated arousals.   DIAGNOSIS - Obstructive Sleep Apnea (327.23 [G47.33 ICD-10]) - Nocturnal Hypoxemia (327.26 [G47.36 ICD-10])   RECOMMENDATIONS - Therapeutic CPAP titration to determine optimal pressure required to alleviate sleep disordered breathing. - Avoid alcohol, sedatives and other CNS depressants that may worsen sleep apnea and disrupt normal sleep architecture. - Sleep hygiene should be reviewed to assess factors that may improve sleep quality. - Weight management and regular exercise should be initiated or continued if appropriate.    Kara Mead MD. [facility]. [facility]   02/27/2016

## 2016-02-29 ENCOUNTER — Telehealth: Payer: Self-pay | Admitting: Emergency Medicine

## 2016-02-29 DIAGNOSIS — G4733 Obstructive sleep apnea (adult) (pediatric): Secondary | ICD-10-CM

## 2016-02-29 NOTE — Telephone Encounter (Signed)
Spoke with pt. She is requesting her sleep study results. Please advise RB thanks

## 2016-03-01 ENCOUNTER — Encounter: Payer: Self-pay | Admitting: Nurse Practitioner

## 2016-03-01 ENCOUNTER — Ambulatory Visit (INDEPENDENT_AMBULATORY_CARE_PROVIDER_SITE_OTHER): Payer: Medicare Other | Admitting: Nurse Practitioner

## 2016-03-01 VITALS — BP 182/80 | HR 94 | Ht 60.0 in | Wt 189.0 lb

## 2016-03-01 DIAGNOSIS — I10 Essential (primary) hypertension: Secondary | ICD-10-CM | POA: Diagnosis not present

## 2016-03-01 LAB — BASIC METABOLIC PANEL
BUN: 13 mg/dL (ref 7–25)
CO2: 28 mmol/L (ref 20–31)
Calcium: 9.4 mg/dL (ref 8.6–10.4)
Chloride: 99 mmol/L (ref 98–110)
Creat: 0.65 mg/dL (ref 0.50–0.99)
Glucose, Bld: 219 mg/dL — ABNORMAL HIGH (ref 65–99)
Potassium: 4 mmol/L (ref 3.5–5.3)
Sodium: 137 mmol/L (ref 135–146)

## 2016-03-01 MED ORDER — LOSARTAN POTASSIUM 50 MG PO TABS: 50.0000 mg | ORAL_TABLET | Freq: Every day | ORAL | 6 refills | Status: DC

## 2016-03-01 NOTE — Telephone Encounter (Signed)
Called and spoke with pt and she is aware of RB recs and agreed to do the cpap titration study.  This order has been placed.

## 2016-03-01 NOTE — Telephone Encounter (Signed)
Please let her know that the study confirmed that she does have Sleep apnea. I recommend that she undergo a CPAP titration study if she is willing to do so. If she agrees please order a CPAP titration study in the sleep lab. Thanks.

## 2016-03-01 NOTE — Telephone Encounter (Signed)
Patient returning call - She can be reached at [phone]

## 2016-03-01 NOTE — Telephone Encounter (Signed)
Lm with family to have the pt return our call.

## 2016-03-01 NOTE — Patient Instructions (Signed)
We will be checking the following labs today - BMET   Medication Instructions:    Continue with your current medicines. BUT  I am adding Losartan 50 mg to take one a day - this has been sent to the drug store.    Testing/Procedures To Be Arranged:  N/A  Follow-Up:   See me in about a month with a BMET    Other Special Instructions:   Try to check your BP for me if you can    If you need a refill on your cardiac medications before your next appointment, please call your pharmacy.   Call the [place] office at [phone] if you have any questions, problems or concerns.

## 2016-03-01 NOTE — Progress Notes (Signed)
CARDIOLOGY OFFICE NOTE  Date:  03/01/2016    Destiny Ballard Date of Birth: [birth year] Medical Record [id number]  PCP:  Lamar Blinks, MD  Cardiologist:  Servando Snare    Chief Complaint  Patient presents with  . Leg Swelling    ANKLES    History of Present Illness: Destiny Ballard is a 65 y.o. female who presents today for a post hospital visit. Seen for Dr. Aundra Dubin - following with me going forward.   She has a medical history significant of hypertension, hyperlipidemia, diabetes mellitus, GERD, anxiety, morbid obesity, & CAD, s/p of stent placement 2007.   Admitted last month with chest pain - lots of stress with family. Ended up getting repeat cardiac cath - this was stable. To continue with medical management.   Comes in today. Here with her great grand daughter. Has had her sleep study done - waiting for results. Still with lots of stress. This will probably be life long. We did not really discuss this today given that her great grand daughter is with her. Some chest pain off and on. BP remains high. Not on ARB/ACE - she is diabetic. Blood sugars not controlled. She says "just doing the best I can".   Past Medical History:  Diagnosis Date  . Anxiety    Prior suicide attempt  . CAD (coronary artery disease)    a) s/p DES to LAD 07/2005 b) Last Myoview low risk 11/2011 showing small fixed apical perfusion defect (prior MI vs attenuation) but no ischemia - normal EF.  [name] Cervical spondylosis   . Coronary atherosclerosis 06/28/2008  . Depression   . Depression with anxiety   . Diabetes mellitus without complication ([place])   . GERD (gastroesophageal reflux disease)   . Hyperlipidemia   . Hypertension   . Insulin resistance   . Iron deficiency anemia   . Obesity     Past Surgical History:  Procedure Laterality Date  . BREAST ENHANCEMENT SURGERY    . CARDIAC CATHETERIZATION  06/17/2007   NORMAL. EF 60%  . CARDIAC CATHETERIZATION N/A 01/29/2016   Procedure: Left  Heart Cath and Coronary Angiography;  Surgeon: Sherren Mocha, MD;  Location: [facility];  Service: Cardiovascular;  Laterality: N/A;  . CERVICAL SPONDYLOSIS     SINGLE LEVEL FUSION  . CHILDBIRTH     X3  . CORONARY STENT PLACEMENT  07/2005   LEFT ANTERIOR DESCENDING  . FOREARM FRACTURE SURGERY  2010   hand and shoulder   . INCISION AND DRAINAGE BREAST ABSCESS  01/05/2012      . INCISION AND DRAINAGE PERIRECTAL ABSCESS N/A 02/18/2014   Procedure: IRRIGATION AND DEBRIDEMENT PERIRECTAL ABSCESS;  Surgeon: Pedro Earls, MD;  Location: [facility];  Service: General;  Laterality: N/A;  . LUMBAR LAMINECTOMY    . ROTATOR CUFF REPAIR     bilaterla  . TONSILLECTOMY AND ADENOIDECTOMY    . TUBAL LIGATION       Medications: Current Outpatient Prescriptions  Medication Sig Dispense Refill  . amLODipine (NORVASC) 10 MG tablet TAKE 1 TABLET BY MOUTH EVERY DAY 90 tablet 1  . aspirin EC 81 MG tablet Take 81 mg by mouth daily.    . benzonatate (TESSALON PERLES) 100 MG capsule Take 1 capsule (100 mg total) by mouth 3 (three) times daily as needed for cough. 60 capsule 1  . bromocriptine (PARLODEL) 2.5 MG tablet TAKE 1/2 TABLET (1.25 MG TOTAL) BY MOUTH AT BEDTIME. 45 tablet 1  . cetirizine (ZYRTEC) 10  MG tablet TAKE 1 TABLET BY MOUTH EVERY DAY 30 tablet 7  . Cholecalciferol (VITAMIN D3) 1000 UNITS CAPS Take 1,000 Units by mouth daily.     . clonazePAM (KLONOPIN) 0.5 MG tablet Take 0.5 tablets (0.25 mg total) by mouth 2 (two) times daily as needed for anxiety. 20 tablet 1  . CRESTOR 20 MG tablet TAKE 1 TABLET BY MOUTH EVERY DAY 90 tablet 3  . Cyanocobalamin (VITAMIN B 12 PO) Take 500 mg by mouth 2 (two) times daily.    . diphenhydramine-acetaminophen (TYLENOL PM) 25-500 MG TABS Take 1 tablet by mouth at bedtime.     . fexofenadine (ALLEGRA) 180 MG tablet Take 180 mg by mouth 2 (two) times daily.     [name] FLUoxetine (PROZAC) 40 MG capsule Take 1 capsule (40 mg total) by mouth daily. 90 capsule 3  .  furosemide (LASIX) 20 MG tablet Take one tablet daily if needed for leg swelling 30 tablet 1  . gabapentin (NEURONTIN) 300 MG capsule Take 300 mg by mouth 2 (two) times daily.    . hydrALAZINE (APRESOLINE) 25 MG tablet Take 2 tablets (50 mg total) by mouth 3 (three) times daily. 90 tablet 1  . hydrOXYzine (ATARAX/VISTARIL) 25 MG tablet TAKE 1/2 TO 1 TABLET AT BEDTIME AS NEEDED FOR ITCHING 30 tablet 0  . Insulin Detemir (LEVEMIR FLEXTOUCH) 100 UNIT/ML Pen Inject 10 Units into the skin daily. 15 mL 3  . Insulin Pen Needle 31G X 5 MM MISC Use to inject insulin as instructed once a day 100 each 1  . meclizine (ANTIVERT) 12.5 MG tablet Take 12.5 mg by mouth 3 (three) times daily as needed for dizziness.    . metFORMIN (GLUCOPHAGE-XR) 500 MG 24 hr tablet Take 2 tablets (1,000 mg total) by mouth daily with breakfast. (Patient taking differently: Take 500 mg by mouth 2 (two) times daily. ) 180 tablet 3  . metoprolol succinate (TOPROL-XL) 100 MG 24 hr tablet TAKE 1 TABLET BY MOUTH EVERY DAY 90 tablet 0  . nitroGLYCERIN (NITROSTAT) 0.4 MG SL tablet Place 0.4 mg under the tongue every 5 (five) minutes as needed.    . nitroGLYCERIN (NITROSTAT) 0.4 MG SL tablet Place 1 tablet (0.4 mg total) under the tongue every 5 (five) minutes as needed for chest pain. 50 tablet 3  . omega-3 acid ethyl esters (LOVAZA) 1 g capsule Take 1 capsule (1 g total) by mouth 2 (two) times daily. 60 capsule 1  . pantoprazole (PROTONIX) 40 MG tablet Take 1 tablet (40 mg total) by mouth daily. 30 tablet 3  . repaglinide (PRANDIN) 2 MG tablet Take 1 tablet (2 mg total) by mouth 3 (three) times daily before meals. 90 tablet 11  . vitamin E 400 UNIT capsule Take 400 Units by mouth daily.    [name] losartan (COZAAR) 50 MG tablet Take 1 tablet (50 mg total) by mouth daily. 30 tablet 6   No current facility-administered medications for this visit.     Allergies: Allergies  Allergen Reactions  . Prednisone Other (See Comments)    REACTION:  mood swings, nightmares. "Shot doesn't bother me, reaction is just with the pill" she states she has had the steroid injections before. From our records methylprednisone was given to her in 2013 without any complications.  . Nitroglycerin     Severe headache only- known SE. Pt is able to use this if needed     Social History: The patient  reports that she has never smoked. She has never  used smokeless tobacco. She reports that she drinks alcohol. She reports that she does not use drugs.   Family History: The patient's family history includes Allergies in her other; Asthma in her daughter and mother; Cancer in her daughter and sister; Cervical cancer in her daughter; Diabetes in her mother and sister; Heart attack in her mother; Heart disease in her mother; Lung cancer in her mother; Suicidality in her father.   Review of Systems: Please see the history of present illness.   Otherwise, the review of systems is positive for none.   All other systems are reviewed and negative.   Physical Exam: VS:  BP (!) 182/80 (BP Location: Right Arm, Patient Position: Sitting, Cuff Size: Normal)   Pulse 94   Ht 5' (1.524 m)   Wt 189 lb (85.7 kg)   BMI 36.91 kg/m  .  BMI Body mass index is 36.91 kg/m.  Wt Readings from Last 3 Encounters:  03/01/16 189 lb (85.7 kg)  02/20/16 186 lb (84.4 kg)  02/20/16 186 lb (84.4 kg)   BP is 180/80 by me in the right arm.   General: Pleasant. She looks older than her stated age. Looks tired but in no acute distress.   HEENT: Normal.  Neck: Supple, no JVD, carotid bruits, or masses noted.  Cardiac: Regular rate and rhythm. +S4. No edema.  Respiratory:  Lungs are clear to auscultation bilaterally with normal work of breathing.  GI: Soft and nontender.  MS: No deformity or atrophy. Gait and ROM intact.  Skin: Warm and dry. Color is normal.  Neuro:  Strength and sensation are intact and no gross focal deficits noted.  Psych: Alert, appropriate and with normal  affect.   LABORATORY DATA:  EKG:  EKG is not ordered today.  Lab Results  Component Value Date   WBC 10.5 01/26/2016   HGB 13.3 01/26/2016   HCT 39.8 01/26/2016   PLT 278 01/26/2016   GLUCOSE 146 (H) 02/07/2016   CHOL 239 (H) 01/27/2016   TRIG 829 (H) 01/27/2016   HDL 36 (L) 01/27/2016   LDLDIRECT 85.4 03/24/2014   LDLCALC UNABLE TO CALCULATE IF TRIGLYCERIDE OVER 400 mg/dL 01/27/2016   ALT 22 01/26/2016   AST 28 01/26/2016   NA 138 02/07/2016   K 4.0 02/07/2016   CL 99 02/07/2016   CREATININE 0.63 02/07/2016   BUN 11 02/07/2016   CO2 32 02/07/2016   TSH 2.361 01/27/2016   INR 0.92 01/27/2016   HGBA1C 9.1 (H) 01/27/2016   MICROALBUR 4.7 (H) 08/26/2014    BNP (last 3 results) No results for input(s): BNP in the last 8760 hours.  ProBNP (last 3 results) No results for input(s): PROBNP in the last 8760 hours.   Other Studies Reviewed Today:   2-D echo 01/2016 - Left ventricle: The cavity size was normal. There was mild  concentric hypertrophy. Systolic function was normal. The  estimated ejection fraction was in the range of 60% to 65%. Wall  motion was normal; there were no regional wall motion  abnormalities. Left ventricular diastolic function parameters  were normal. - Aortic valve: Trileaflet; normal thickness leaflets. There was no  regurgitation. - Aortic root: The aortic root was normal in size. - Mitral valve: Structurally normal valve. - Left atrium: The atrium was normal in size. - Right ventricle: The cavity size was normal. Wall thickness was  normal. Systolic function was normal. - Tricuspid valve: There was no regurgitation. - Pulmonary arteries: Systolic pressure was within the normal  range. - Inferior vena cava: The vessel was normal in size. - Pericardium, extracardiac: There was no pericardial effusion   Heart cath: 01/29/16 1. Continued patency of the LAD stent 2. Mild nonobstructive CAD  3. Normal LV function by echo  assessment  Assessment/Plan:  Chest pain and CAD: s/p of stent 2007. Stable cardiac cath findings. Would continue with medical management and CV risk factor modification. BP needs to be treated.   HTN: not clear why she is not on ACE/ARB - especially with her diabetes - adding Losartan 50 mg a day. BMET today.   GERD: -will continue Protonix  DM-II: uncontrolled  HLD with hypertriglyceridemia:  -LDL unable to be calculated due to elevated TG -Continue statins and started on lovaza -advise to follow low fat diet  Will plan on rechecking her lab on return.   Hypokalemia: needs repeat lab today  Chronic cough and chronic mild SOB: followed by pulmonary  Situational stress - I doubt this is going to change.   Current medicines are reviewed with the patient today.  The patient does not have concerns regarding medicines other than what has been noted above.  The following changes have been made:  See above.  Labs/ tests ordered today include:    Orders Placed This Encounter  Procedures  . Basic metabolic panel     Disposition:   FU with me in 6 months.   Patient is agreeable to this plan and will call if any problems develop in the interim.   Signed: Burtis Junes, RN, ANP-C 03/01/2016 2:48 PM  [facility] [address] [place], [place]  [address] Phone: [phone] Fax: [phone]

## 2016-03-01 NOTE — Addendum Note (Signed)
Addended by: Elie Confer on: 03/01/2016 03:53 PM   Modules accepted: Orders

## 2016-03-07 ENCOUNTER — Ambulatory Visit (HOSPITAL_BASED_OUTPATIENT_CLINIC_OR_DEPARTMENT_OTHER): Payer: Medicare Other | Attending: Emergency Medicine | Admitting: Pulmonary Disease

## 2016-03-07 VITALS — Ht 60.0 in | Wt 183.0 lb

## 2016-03-07 DIAGNOSIS — G4733 Obstructive sleep apnea (adult) (pediatric): Secondary | ICD-10-CM

## 2016-03-07 DIAGNOSIS — R0683 Snoring: Secondary | ICD-10-CM | POA: Insufficient documentation

## 2016-03-07 DIAGNOSIS — G4730 Sleep apnea, unspecified: Secondary | ICD-10-CM | POA: Diagnosis present

## 2016-03-08 DIAGNOSIS — G4733 Obstructive sleep apnea (adult) (pediatric): Secondary | ICD-10-CM | POA: Diagnosis not present

## 2016-03-08 NOTE — Procedures (Signed)
Patient Name: Destiny Ballard, Destiny Ballard Date: 03/07/2016 Gender: Female D.O.B: Jun 26, 1951 Age (years): 64 Referring Provider: Baltazar Apo Height (inches): 60 Interpreting Physician: Kara Mead MD, ABSM Weight (lbs): 183 RPSGT: Laren Everts BMI: 36 MRN: [id number] Neck Size: 16.50 CLINICAL INFORMATION The patient is referred for a CPAP titration to treat sleep apnea.   Date of NPSG: 02/20/16, AHI 35/h   SLEEP STUDY TECHNIQUE As per the AASM Manual for the Scoring of Sleep and Associated Events v2.3 (April 2016) with a hypopnea requiring 4% desaturations. The channels recorded and monitored were frontal, central and occipital EEG, electrooculogram (EOG), submentalis EMG (chin), nasal and oral airflow, thoracic and abdominal wall motion, anterior tibialis EMG, snore microphone, electrocardiogram, and pulse oximetry. Continuous positive airway pressure (CPAP) was initiated at the beginning of the study and titrated to treat sleep-disordered breathing.   RESPIRATORY PARAMETERS Optimal PAP Pressure (cm): 13 AHI at Optimal Pressure (/hr): 0.0 Overall Minimal O2 (%): 80.00 Supine % at Optimal Pressure (%): 0 Minimal O2 at Optimal Pressure (%): 91.0     SLEEP ARCHITECTURE The study was initiated at 9:37:26 PM and ended at 4:35:19 AM. Sleep onset time was 105.0 minutes and the sleep efficiency was 54.3%. The total sleep time was 227.0 minutes. The patient spent 7.49% of the night in stage N1 sleep, 38.99% in stage N2 sleep, 0.00% in stage N3 and 53.52% in REM.Stage REM latency was 65.0 minutes Wake after sleep onset was 85.9. Alpha intrusion was absent. Supine sleep was 0.00%.   CARDIAC DATA The 2 lead EKG demonstrated sinus rhythm. The mean heart rate was 71.00 beats per minute. Other EKG findings include: None.   LEG MOVEMENT DATA The total Periodic Limb Movements of Sleep (PLMS) were 0. The PLMS index was 0.00. A PLMS index of <15 is considered normal in adults.   IMPRESSIONS -  The optimal PAP pressure was 13 cm of water. - Central sleep apnea was not noted during this titration (CAI = 0.0/h). - Severe oxygen desaturations were observed during this titration (min O2 = 80.00%). - The patient snored with Moderate snoring volume during this titration study. - No cardiac abnormalities were observed during this study. - Clinically significant periodic limb movements were not noted during this study. Arousals associated with PLMs were rare. She slept on a wedge during the study   DIAGNOSIS - Obstructive Sleep Apnea (327.23 [G47.33 ICD-10])   RECOMMENDATIONS - Trial of CPAP therapy on 13 cm H2O with a Small size Fisher&Paykel Full Face Mask Simplus mask and heated humidification. - Avoid alcohol, sedatives and other CNS depressants that may worsen sleep apnea and disrupt normal sleep architecture. - Sleep hygiene should be reviewed to assess factors that may improve sleep quality. - Weight management and regular exercise should be initiated or continued. - Return to [facility] for re-evaluation after 4 weeks of therapy    Kara Mead MD. FCCP. [facility]   03/08/2016

## 2016-03-12 ENCOUNTER — Other Ambulatory Visit: Payer: Self-pay | Admitting: Emergency Medicine

## 2016-03-12 DIAGNOSIS — G4733 Obstructive sleep apnea (adult) (pediatric): Secondary | ICD-10-CM

## 2016-03-19 ENCOUNTER — Encounter: Payer: Self-pay | Admitting: Nurse Practitioner

## 2016-03-28 ENCOUNTER — Other Ambulatory Visit: Payer: Self-pay | Admitting: Family Medicine

## 2016-03-28 ENCOUNTER — Other Ambulatory Visit: Payer: Self-pay | Admitting: Cardiology

## 2016-03-29 ENCOUNTER — Other Ambulatory Visit: Payer: Self-pay | Admitting: Emergency Medicine

## 2016-03-29 MED ORDER — HYDROXYZINE HCL 25 MG PO TABS: ORAL_TABLET | ORAL | 1 refills | Status: DC

## 2016-03-30 ENCOUNTER — Other Ambulatory Visit: Payer: Self-pay | Admitting: Cardiology

## 2016-04-02 ENCOUNTER — Telehealth: Payer: Self-pay | Admitting: Emergency Medicine

## 2016-04-02 NOTE — Telephone Encounter (Signed)
Spoke with pt. States that she has not heard anything from [facility] about her CPAP machine. I called [facility] and spoke with Jeani Hawking. She states that are having some issues with the pt's insurance. Jeani Hawking states that she is going to be calling them today and she will call the pt as soon as she hears something. Pt is aware of this and verbalized understanding. Nothing further was needed at this time.

## 2016-04-03 ENCOUNTER — Ambulatory Visit: Payer: Medicare Other | Admitting: Nurse Practitioner

## 2016-04-03 ENCOUNTER — Other Ambulatory Visit: Payer: Medicare Other

## 2016-04-05 DIAGNOSIS — H25041 Posterior subcapsular polar age-related cataract, right eye: Secondary | ICD-10-CM | POA: Diagnosis not present

## 2016-04-06 ENCOUNTER — Other Ambulatory Visit: Payer: Self-pay | Admitting: Family Medicine

## 2016-04-06 DIAGNOSIS — R600 Localized edema: Secondary | ICD-10-CM

## 2016-04-08 ENCOUNTER — Other Ambulatory Visit: Payer: Self-pay | Admitting: Emergency Medicine

## 2016-04-08 ENCOUNTER — Other Ambulatory Visit: Payer: Self-pay | Admitting: Family Medicine

## 2016-04-08 DIAGNOSIS — F329 Major depressive disorder, single episode, unspecified: Secondary | ICD-10-CM

## 2016-04-08 DIAGNOSIS — F32A Depression, unspecified: Secondary | ICD-10-CM

## 2016-04-08 MED ORDER — FLUOXETINE HCL 40 MG PO CAPS: 40.0000 mg | ORAL_CAPSULE | Freq: Every day | ORAL | 1 refills | Status: DC

## 2016-04-15 ENCOUNTER — Ambulatory Visit: Payer: Medicare Other | Admitting: Nurse Practitioner

## 2016-04-15 NOTE — Progress Notes (Deleted)
CARDIOLOGY OFFICE NOTE  Date:  04/15/2016    Destiny Ballard Date of Birth: September 04, 1950 Medical Record [id number]  PCP:  Lamar Blinks, MD  Cardiologist:  Servando Snare & ***    No chief complaint on file.   History of Present Illness: Destiny Ballard is a 65 y.o. female who presents today for a ***   Comes in today. Here with   Past Medical History:  Diagnosis Date  . Anxiety    Prior suicide attempt  . CAD (coronary artery disease)    a) s/p DES to LAD 07/2005 b) Last Myoview low risk 11/2011 showing small fixed apical perfusion defect (prior MI vs attenuation) but no ischemia - normal EF.  [name] Cervical spondylosis   . Coronary atherosclerosis 06/28/2008  . Depression   . Depression with anxiety   . Diabetes mellitus without complication ([place])   . GERD (gastroesophageal reflux disease)   . Hyperlipidemia   . Hypertension   . Insulin resistance   . Iron deficiency anemia   . Obesity     Past Surgical History:  Procedure Laterality Date  . BREAST ENHANCEMENT SURGERY    . CARDIAC CATHETERIZATION  06/17/2007   NORMAL. EF 60%  . CARDIAC CATHETERIZATION N/A 01/29/2016   Procedure: Left Heart Cath and Coronary Angiography;  Surgeon: Sherren Mocha, MD;  Location: [facility];  Service: Cardiovascular;  Laterality: N/A;  . CERVICAL SPONDYLOSIS     SINGLE LEVEL FUSION  . CHILDBIRTH     X3  . CORONARY STENT PLACEMENT  07/2005   LEFT ANTERIOR DESCENDING  . FOREARM FRACTURE SURGERY  2010   hand and shoulder   . INCISION AND DRAINAGE BREAST ABSCESS  01/05/2012      . INCISION AND DRAINAGE PERIRECTAL ABSCESS N/A 02/18/2014   Procedure: IRRIGATION AND DEBRIDEMENT PERIRECTAL ABSCESS;  Surgeon: Pedro Earls, MD;  Location: [facility];  Service: General;  Laterality: N/A;  . LUMBAR LAMINECTOMY    . ROTATOR CUFF REPAIR     bilaterla  . TONSILLECTOMY AND ADENOIDECTOMY    . TUBAL LIGATION       Medications: Current Outpatient Prescriptions  Medication Sig Dispense  Refill  . amLODipine (NORVASC) 10 MG tablet TAKE 1 TABLET BY MOUTH EVERY DAY 90 tablet 1  . aspirin EC 81 MG tablet Take 81 mg by mouth daily.    . benzonatate (TESSALON PERLES) 100 MG capsule Take 1 capsule (100 mg total) by mouth 3 (three) times daily as needed for cough. 60 capsule 1  . bromocriptine (PARLODEL) 2.5 MG tablet TAKE 1/2 TABLET (1.25 MG TOTAL) BY MOUTH AT BEDTIME. 45 tablet 1  . cetirizine (ZYRTEC) 10 MG tablet TAKE 1 TABLET BY MOUTH EVERY DAY 30 tablet 7  . Cholecalciferol (VITAMIN D3) 1000 UNITS CAPS Take 1,000 Units by mouth daily.     . clonazePAM (KLONOPIN) 0.5 MG tablet Take 0.5 tablets (0.25 mg total) by mouth 2 (two) times daily as needed for anxiety. 20 tablet 1  . CRESTOR 20 MG tablet TAKE 1 TABLET BY MOUTH EVERY DAY 90 tablet 3  . Cyanocobalamin (VITAMIN B 12 PO) Take 500 mg by mouth 2 (two) times daily.    . diphenhydramine-acetaminophen (TYLENOL PM) 25-500 MG TABS Take 1 tablet by mouth at bedtime.     . fexofenadine (ALLEGRA) 180 MG tablet Take 180 mg by mouth 2 (two) times daily.     [name] FLUoxetine (PROZAC) 40 MG capsule Take 1 capsule (40 mg total) by mouth daily.  90 capsule 1  . furosemide (LASIX) 20 MG tablet TAKE ONE TABLET DAILY IF NEEDED FOR LEG SWELLING 30 tablet 1  . gabapentin (NEURONTIN) 300 MG capsule Take 300 mg by mouth 2 (two) times daily.    . hydrALAZINE (APRESOLINE) 25 MG tablet Take 2 tablets (50 mg total) by mouth 3 (three) times daily. 90 tablet 1  . hydrOXYzine (ATARAX/VISTARIL) 25 MG tablet TAKE 1/2 TO 1 TABLET AT BEDTIME AS NEEDED FOR ITCHING 30 tablet 1  . Insulin Detemir (LEVEMIR FLEXTOUCH) 100 UNIT/ML Pen Inject 10 Units into the skin daily. 15 mL 3  . Insulin Pen Needle 31G X 5 MM MISC Use to inject insulin as instructed once a day 100 each 1  . losartan (COZAAR) 50 MG tablet Take 1 tablet (50 mg total) by mouth daily. 30 tablet 6  . meclizine (ANTIVERT) 12.5 MG tablet Take 12.5 mg by mouth 3 (three) times daily as needed for dizziness.      . metFORMIN (GLUCOPHAGE-XR) 500 MG 24 hr tablet Take 2 tablets (1,000 mg total) by mouth daily with breakfast. (Patient taking differently: Take 500 mg by mouth 2 (two) times daily. ) 180 tablet 3  . metoprolol succinate (TOPROL-XL) 100 MG 24 hr tablet Take 1 tablet (100 mg total) by mouth daily. 90 tablet 3  . nitroGLYCERIN (NITROSTAT) 0.4 MG SL tablet Place 0.4 mg under the tongue every 5 (five) minutes as needed.    . nitroGLYCERIN (NITROSTAT) 0.4 MG SL tablet Place 1 tablet (0.4 mg total) under the tongue every 5 (five) minutes as needed for chest pain. 50 tablet 3  . omega-3 acid ethyl esters (LOVAZA) 1 g capsule TAKE ONE CAPSULE BY MOUTH TWICE A DAY 60 capsule 1  . pantoprazole (PROTONIX) 40 MG tablet Take 1 tablet (40 mg total) by mouth daily. 30 tablet 3  . repaglinide (PRANDIN) 2 MG tablet Take 1 tablet (2 mg total) by mouth 3 (three) times daily before meals. 90 tablet 11  . vitamin E 400 UNIT capsule Take 400 Units by mouth daily.     No current facility-administered medications for this visit.     Allergies: Allergies  Allergen Reactions  . Prednisone Other (See Comments)    REACTION: mood swings, nightmares. "Shot doesn't bother me, reaction is just with the pill" she states she has had the steroid injections before. From our records methylprednisone was given to her in 2013 without any complications.  . Nitroglycerin     Severe headache only- known SE. Pt is able to use this if needed     Social History: The patient  reports that she has never smoked. She has never used smokeless tobacco. She reports that she drinks alcohol. She reports that she does not use drugs.   Family History: The patient's ***family history includes Allergies in her other; Asthma in her daughter and mother; Cancer in her daughter and sister; Cervical cancer in her daughter; Diabetes in her mother and sister; Heart attack in her mother; Heart disease in her mother; Lung cancer in her mother; Suicidality  in her father.   Review of Systems: Please see the history of present illness.   Otherwise, the review of systems is positive for {NONE DEFAULTED:18576::"none"}.   All other systems are reviewed and negative.   Physical Exam: VS:  There were no vitals taken for this visit. [name]  BMI There is no height or weight on file to calculate BMI.  Wt Readings from Last 3 Encounters:  03/07/16 183  lb (83 kg)  03/01/16 189 lb (85.7 kg)  02/20/16 186 lb (84.4 kg)    General: Pleasant. Well developed, well nourished and in no acute distress.   HEENT: Normal.  Neck: Supple, no JVD, carotid bruits, or masses noted.  Cardiac: ***Regular rate and rhythm. No murmurs, rubs, or gallops. No edema.  Respiratory:  Lungs are clear to auscultation bilaterally with normal work of breathing.  GI: Soft and nontender.  MS: No deformity or atrophy. Gait and ROM intact.  Skin: Warm and dry. Color is normal.  Neuro:  Strength and sensation are intact and no gross focal deficits noted.  Psych: Alert, appropriate and with normal affect.   LABORATORY DATA:  EKG:  EKG {ACTION; IS/IS [id number] ordered today. This demonstrates ***.  Lab Results  Component Value Date   WBC 10.5 01/26/2016   HGB 13.3 01/26/2016   HCT 39.8 01/26/2016   PLT 278 01/26/2016   GLUCOSE 219 (H) 03/01/2016   CHOL 239 (H) 01/27/2016   TRIG 829 (H) 01/27/2016   HDL 36 (L) 01/27/2016   LDLDIRECT 85.4 03/24/2014   LDLCALC UNABLE TO CALCULATE IF TRIGLYCERIDE OVER 400 mg/dL 01/27/2016   ALT 22 01/26/2016   AST 28 01/26/2016   NA 137 03/01/2016   K 4.0 03/01/2016   CL 99 03/01/2016   CREATININE 0.65 03/01/2016   BUN 13 03/01/2016   CO2 28 03/01/2016   TSH 2.361 01/27/2016   INR 0.92 01/27/2016   HGBA1C 9.1 (H) 01/27/2016   MICROALBUR 4.7 (H) 08/26/2014    BNP (last 3 results) No results for input(s): BNP in the last 8760 hours.  ProBNP (last 3 results) No results for input(s): PROBNP in the last 8760 hours.   Other Studies  Reviewed Today:   Assessment/Plan:   Current medicines are reviewed with the patient today.  The patient does not have concerns regarding medicines other than what has been noted above.  The following changes have been made:  See above.  Labs/ tests ordered today include:   No orders of the defined types were placed in this encounter.    Disposition:   FU with *** in {gen number [id number] {Days to years:10300}.   Patient is agreeable to this plan and will call if any problems develop in the interim.   Signed: Burtis Junes, RN, ANP-C 04/15/2016 7:27 AM  [facility] [address] [place], [place]  [address] Phone: [phone] Fax: [phone]

## 2016-04-17 ENCOUNTER — Other Ambulatory Visit: Payer: Self-pay | Admitting: Family Medicine

## 2016-04-17 DIAGNOSIS — H25813 Combined forms of age-related cataract, bilateral: Secondary | ICD-10-CM | POA: Diagnosis not present

## 2016-04-17 DIAGNOSIS — E119 Type 2 diabetes mellitus without complications: Secondary | ICD-10-CM | POA: Diagnosis not present

## 2016-04-17 DIAGNOSIS — H02831 Dermatochalasis of right upper eyelid: Secondary | ICD-10-CM | POA: Diagnosis not present

## 2016-04-17 DIAGNOSIS — H02834 Dermatochalasis of left upper eyelid: Secondary | ICD-10-CM | POA: Diagnosis not present

## 2016-04-19 ENCOUNTER — Other Ambulatory Visit: Payer: Self-pay | Admitting: Emergency Medicine

## 2016-04-19 MED ORDER — HYDRALAZINE HCL 25 MG PO TABS: 50.0000 mg | ORAL_TABLET | Freq: Three times a day (TID) | ORAL | 1 refills | Status: DC

## 2016-04-25 ENCOUNTER — Ambulatory Visit (INDEPENDENT_AMBULATORY_CARE_PROVIDER_SITE_OTHER): Payer: Medicare Other | Admitting: Emergency Medicine

## 2016-04-25 ENCOUNTER — Ambulatory Visit: Payer: Medicare Other | Admitting: Emergency Medicine

## 2016-04-25 ENCOUNTER — Encounter: Payer: Self-pay | Admitting: Emergency Medicine

## 2016-04-25 DIAGNOSIS — Z113 Encounter for screening for infections with a predominantly sexual mode of transmission: Secondary | ICD-10-CM | POA: Diagnosis not present

## 2016-04-25 DIAGNOSIS — J441 Chronic obstructive pulmonary disease with (acute) exacerbation: Secondary | ICD-10-CM

## 2016-04-25 DIAGNOSIS — Z01419 Encounter for gynecological examination (general) (routine) without abnormal findings: Secondary | ICD-10-CM | POA: Diagnosis not present

## 2016-04-25 DIAGNOSIS — R05 Cough: Secondary | ICD-10-CM | POA: Diagnosis not present

## 2016-04-25 DIAGNOSIS — Z1231 Encounter for screening mammogram for malignant neoplasm of breast: Secondary | ICD-10-CM | POA: Diagnosis not present

## 2016-04-25 DIAGNOSIS — Z1159 Encounter for screening for other viral diseases: Secondary | ICD-10-CM | POA: Diagnosis not present

## 2016-04-25 DIAGNOSIS — Z23 Encounter for immunization: Secondary | ICD-10-CM

## 2016-04-25 DIAGNOSIS — G4733 Obstructive sleep apnea (adult) (pediatric): Secondary | ICD-10-CM | POA: Diagnosis not present

## 2016-04-25 DIAGNOSIS — R053 Chronic cough: Secondary | ICD-10-CM

## 2016-04-25 DIAGNOSIS — R0600 Dyspnea, unspecified: Secondary | ICD-10-CM

## 2016-04-25 DIAGNOSIS — R0609 Other forms of dyspnea: Secondary | ICD-10-CM

## 2016-04-25 DIAGNOSIS — Z124 Encounter for screening for malignant neoplasm of cervix: Secondary | ICD-10-CM | POA: Diagnosis not present

## 2016-04-25 DIAGNOSIS — J449 Chronic obstructive pulmonary disease, unspecified: Secondary | ICD-10-CM

## 2016-04-25 HISTORY — DX: Chronic obstructive pulmonary disease with (acute) exacerbation: J44.1

## 2016-04-25 LAB — PULMONARY FUNCTION TEST
DL/VA % pred: 115 %
DL/VA: 4.89 ml/min/mmHg/L
DLCO cor % pred: 85 %
DLCO cor: 16.13 ml/min/mmHg
DLCO unc % pred: 86 %
DLCO unc: 16.33 ml/min/mmHg
FEF 25-75 Post: 1.12 L/sec
FEF 25-75 Pre: 0.95 L/sec
FEF2575-%Change-Post: 17 %
FEF2575-%Pred-Post: 61 %
FEF2575-%Pred-Pre: 52 %
FEV1-%Change-Post: 3 %
FEV1-%Pred-Post: 55 %
FEV1-%Pred-Pre: 53 %
FEV1-Post: 1.1 L
FEV1-Pre: 1.06 L
FEV1FVC-%Change-Post: 6 %
FEV1FVC-%Pred-Pre: 103 %
FEV6-%Change-Post: -3 %
FEV6-%Pred-Post: 51 %
FEV6-%Pred-Pre: 52 %
FEV6-Post: 1.29 L
FEV6-Pre: 1.33 L
FEV6FVC-%Pred-Post: 105 %
FEV6FVC-%Pred-Pre: 105 %
FVC-%Change-Post: -2 %
FVC-%Pred-Post: 49 %
FVC-%Pred-Pre: 50 %
FVC-Post: 1.29 L
FVC-Pre: 1.33 L
Post FEV1/FVC ratio: 85 %
Post FEV6/FVC ratio: 100 %
Pre FEV1/FVC ratio: 80 %
Pre FEV6/FVC Ratio: 100 %
RV % pred: 108 %
RV: 2.1 L
TLC % pred: 84 %
TLC: 3.77 L

## 2016-04-25 MED ORDER — ALBUTEROL SULFATE HFA 108 (90 BASE) MCG/ACT IN AERS: 2.0000 | INHALATION_SPRAY | Freq: Four times a day (QID) | RESPIRATORY_TRACT | 6 refills | Status: DC | PRN

## 2016-04-25 NOTE — Progress Notes (Signed)
Subjective:    Patient ID: [name], female    DOB: 24-Sep-1950, 65 y.o.   MRN: [id number]  HPI 65 year old woman, never smoker, with a history of coronary disease, depression, diabetes, GERD, allergic rhinitis. She's been seen in our office previously for evaluation of possible obstructive sleep apnea - never had her PSG. She is also been seen for chronic coughing. She had pulmonary function testing in June 2014 that I have personally reviewed and were consistent with either restriction or mixed disease.  She continues to have every day cough, non-productive.  Continues to have some GERD, is on protonix, still takes Tums. She is on allegra and zyrtec. She is referred back today following recent hospitalization to further eval her dyspnea, possible OSA and cough.   ROV 04/25/16 -- This is a follow-up visit for chronic cough as well as obstructive sleep apnea. She underwent a sleep study on 02/20/16 that I personally reviewed. This showed evidence of severe obstructive sleep apnea, AHI 35.5 per hour. Follow-up titration study on 8/24 showed that she benefited from CPAP at 13 cm of water. Working with APS to get CPAP. Underwent pulmonary function testing today to compare with priors, I have reviewed > shows significant mixed disease. She has been on SABA before, not currently.  Cough persists, about the same. Relies on tessalon prn. Remains on allegra qd, protonix.    Review of Systems As per HPI  Past Medical History:  Diagnosis Date  . Anxiety    Prior suicide attempt  . CAD (coronary artery disease)    a) s/p DES to LAD 07/2005 b) Last Myoview low risk 11/2011 showing small fixed apical perfusion defect (prior MI vs attenuation) but no ischemia - normal EF.  [name] Cervical spondylosis   . Coronary atherosclerosis 06/28/2008  . Depression   . Depression with anxiety   . Diabetes mellitus without complication ([place])   . GERD (gastroesophageal reflux disease)   . Hyperlipidemia   .  Hypertension   . Insulin resistance   . Iron deficiency anemia   . Obesity      Family History  Problem Relation Age of Onset  . Heart attack Mother   . Diabetes Mother   . Lung cancer Mother   . Asthma Mother   . Heart disease Mother   . Suicidality Father     "killed himself"  . Asthma Daughter     x 2  . Cancer Daughter     pre-cancerous polyp  . Diabetes Sister   . Cancer Sister   . Cervical cancer Daughter     cervical   . Allergies Other     all family--seasonal allergies     Social History   Social History  . Marital status: Married    Spouse name: Lake Bells  . Number of children: 3  . Years of education: 12   Occupational History  . retired from [employer]     11/2012   Social History Main Topics  . Smoking status: Never Smoker  . Smokeless tobacco: Never Used  . Alcohol use Yes     Comment: occ  . Drug use: No  . Sexual activity: Yes    Partners: Male    Birth control/ protection: None   Other Topics Concern  . Not on file   Social History Narrative   Lives with her husband.  Their eldest daughter lives upstairs.     Allergies  Allergen Reactions  . Prednisone Other (See Comments)  REACTION: mood swings, nightmares. "Shot doesn't bother me, reaction is just with the pill" she states she has had the steroid injections before. From our records methylprednisone was given to her in 2013 without any complications.  . Nitroglycerin     Severe headache only- known SE. Pt is able to use this if needed      Outpatient Medications Prior to Visit  Medication Sig Dispense Refill  . amLODipine (NORVASC) 10 MG tablet TAKE 1 TABLET BY MOUTH EVERY DAY 90 tablet 1  . aspirin EC 81 MG tablet Take 81 mg by mouth daily.    . benzonatate (TESSALON PERLES) 100 MG capsule Take 1 capsule (100 mg total) by mouth 3 (three) times daily as needed for cough. 60 capsule 1  . bromocriptine (PARLODEL) 2.5 MG tablet TAKE 1/2 TABLET (1.25 MG TOTAL) BY MOUTH AT BEDTIME. (Patient  taking differently: TAKE 1 TABLET (1.25 MG TOTAL) BY MOUTH AT BEDTIME.) 45 tablet 1  . cetirizine (ZYRTEC) 10 MG tablet TAKE 1 TABLET BY MOUTH EVERY DAY 30 tablet 7  . Cholecalciferol (VITAMIN D3) 1000 UNITS CAPS Take 1,000 Units by mouth daily.     . clonazePAM (KLONOPIN) 0.5 MG tablet Take 0.5 tablets (0.25 mg total) by mouth 2 (two) times daily as needed for anxiety. 20 tablet 1  . CRESTOR 20 MG tablet TAKE 1 TABLET BY MOUTH EVERY DAY 90 tablet 3  . diphenhydramine-acetaminophen (TYLENOL PM) 25-500 MG TABS Take 1 tablet by mouth at bedtime.     . fexofenadine (ALLEGRA) 180 MG tablet Take 180 mg by mouth 2 (two) times daily.     [name] FLUoxetine (PROZAC) 40 MG capsule Take 1 capsule (40 mg total) by mouth daily. 90 capsule 1  . furosemide (LASIX) 20 MG tablet TAKE ONE TABLET DAILY IF NEEDED FOR LEG SWELLING 30 tablet 1  . gabapentin (NEURONTIN) 300 MG capsule Take 300 mg by mouth 2 (two) times daily.    . hydrALAZINE (APRESOLINE) 25 MG tablet Take 2 tablets (50 mg total) by mouth 3 (three) times daily. 180 tablet 1  . hydrOXYzine (ATARAX/VISTARIL) 25 MG tablet TAKE 1/2 TO 1 TABLET AT BEDTIME AS NEEDED FOR ITCHING 30 tablet 1  . Insulin Detemir (LEVEMIR FLEXTOUCH) 100 UNIT/ML Pen Inject 10 Units into the skin daily. 15 mL 3  . Insulin Pen Needle 31G X 5 MM MISC Use to inject insulin as instructed once a day 100 each 1  . losartan (COZAAR) 50 MG tablet Take 1 tablet (50 mg total) by mouth daily. 30 tablet 6  . meclizine (ANTIVERT) 12.5 MG tablet Take 12.5 mg by mouth 3 (three) times daily as needed for dizziness.    . metFORMIN (GLUCOPHAGE-XR) 500 MG 24 hr tablet Take 2 tablets (1,000 mg total) by mouth daily with breakfast. (Patient taking differently: Take 500 mg by mouth 2 (two) times daily. ) 180 tablet 3  . metoprolol succinate (TOPROL-XL) 100 MG 24 hr tablet Take 1 tablet (100 mg total) by mouth daily. 90 tablet 3  . nitroGLYCERIN (NITROSTAT) 0.4 MG SL tablet Place 0.4 mg under the tongue every 5  (five) minutes as needed.    [name] omega-3 acid ethyl esters (LOVAZA) 1 g capsule TAKE ONE CAPSULE BY MOUTH TWICE A DAY 60 capsule 1  . pantoprazole (PROTONIX) 40 MG tablet Take 1 tablet (40 mg total) by mouth daily. 30 tablet 3  . repaglinide (PRANDIN) 2 MG tablet Take 1 tablet (2 mg total) by mouth 3 (three) times daily before meals.  90 tablet 11  . Cyanocobalamin (VITAMIN B 12 PO) Take 500 mg by mouth 2 (two) times daily.    . vitamin E 400 UNIT capsule Take 400 Units by mouth daily.    . nitroGLYCERIN (NITROSTAT) 0.4 MG SL tablet Place 1 tablet (0.4 mg total) under the tongue every 5 (five) minutes as needed for chest pain. 50 tablet 3   No facility-administered medications prior to visit.         Objective:   Physical Exam Vitals:   04/25/16 1247  BP: (!) 158/78  Pulse: 96  SpO2: 91%  Weight: 184 lb (83.5 kg)  Height: 5' (1.524 m)   Gen: Pleasant, overwt woman, in no distress,  normal affect  ENT: No lesions,  mouth clear,  oropharynx clear, no postnasal drip, M4 airway  Neck: No JVD, no TMG, no carotid bruits  Lungs: No use of accessory muscles, clear without rales or rhonchi  Cardiovascular: RRR, heart sounds normal, no murmur or gallops, no peripheral edema  Musculoskeletal: No deformities, no cyanosis or clubbing  Neuro: alert, non focal  Skin: Warm, no lesions or rashes       Assessment & Plan:  OSA (obstructive sleep apnea) Newly confirmed diagnosis. She needs CPAP at 13 cm of water. She is going to start this as she just got a new machine. She has a full face mask. We will follow up in 3 months to ensure good compliance and benefit  Chronic cough Continue current therapy for GERD, allergic rhinitis, continue Tessalon as needed.   Obstructive lung disease (generalized) ([employer]) Pulmonary function testing again shows mixed restriction and probable superimposed obstruction. I would like to do a trial of albuterol to see if she benefits either with regard to cough  or regard to her overall breathing and exertional tolerance.   Baltazar Apo, MD, PhD 04/25/2016, 1:04 PM [facility] [phone] or if no answer [phone]

## 2016-04-25 NOTE — Assessment & Plan Note (Signed)
Newly confirmed diagnosis. She needs CPAP at 13 cm of water. She is going to start this as she just got a new machine. She has a full face mask. We will follow up in 3 months to ensure good compliance and benefit

## 2016-04-25 NOTE — Addendum Note (Signed)
Addended by: Doroteo Glassman D on: 04/25/2016 01:10 PM   Modules accepted: Orders

## 2016-04-25 NOTE — Assessment & Plan Note (Signed)
Pulmonary function testing again shows mixed restriction and probable superimposed obstruction. I would like to do a trial of albuterol to see if she benefits either with regard to cough or regard to her overall breathing and exertional tolerance.

## 2016-04-25 NOTE — Patient Instructions (Signed)
We will start CPAP every night, follow in 3 months to assess how you are doing on it We will do a trial of albuterol 2 puffs as needed for shortness of breath or spells of coughing to see if it helps you.  Continue your other medications as you taking them  Follow with Dr Lamonte Sakai in 3 months or sooner if you have any problems.

## 2016-04-25 NOTE — Assessment & Plan Note (Signed)
Continue current therapy for GERD, allergic rhinitis, continue Tessalon as needed.

## 2016-04-26 ENCOUNTER — Other Ambulatory Visit: Payer: Self-pay | Admitting: Emergency Medicine

## 2016-05-02 DIAGNOSIS — H268 Other specified cataract: Secondary | ICD-10-CM | POA: Diagnosis not present

## 2016-05-02 DIAGNOSIS — H25811 Combined forms of age-related cataract, right eye: Secondary | ICD-10-CM | POA: Diagnosis not present

## 2016-05-02 DIAGNOSIS — H2511 Age-related nuclear cataract, right eye: Secondary | ICD-10-CM | POA: Diagnosis not present

## 2016-05-13 DIAGNOSIS — H2512 Age-related nuclear cataract, left eye: Secondary | ICD-10-CM | POA: Diagnosis not present

## 2016-05-16 DIAGNOSIS — H25812 Combined forms of age-related cataract, left eye: Secondary | ICD-10-CM | POA: Diagnosis not present

## 2016-05-16 DIAGNOSIS — H2512 Age-related nuclear cataract, left eye: Secondary | ICD-10-CM | POA: Diagnosis not present

## 2016-05-23 ENCOUNTER — Other Ambulatory Visit: Payer: Self-pay | Admitting: Family Medicine

## 2016-05-23 DIAGNOSIS — R05 Cough: Secondary | ICD-10-CM

## 2016-05-23 DIAGNOSIS — R053 Chronic cough: Secondary | ICD-10-CM

## 2016-05-24 NOTE — Telephone Encounter (Signed)
Caller name: Relationship to patient: Can be reached: Pharmacy:  [id number] - [place], [place] [phone] (Phone) [phone] (Fax)     Reason for call: Refill benzonatate (TESSALON PERLES) 100 MG capsule [id number]  hydrALAZINE (APRESOLINE) 25 MG tablet [id number]  omega-3 acid ethyl esters (LOVAZA) 1 g capsule [id number]  Need 90 day supply

## 2016-05-28 ENCOUNTER — Other Ambulatory Visit: Payer: Self-pay | Admitting: Emergency Medicine

## 2016-05-28 DIAGNOSIS — R05 Cough: Secondary | ICD-10-CM

## 2016-05-28 DIAGNOSIS — R053 Chronic cough: Secondary | ICD-10-CM

## 2016-05-28 MED ORDER — OMEGA-3-ACID ETHYL ESTERS 1 G PO CAPS: 1.0000 | ORAL_CAPSULE | Freq: Two times a day (BID) | ORAL | 11 refills | Status: DC

## 2016-05-28 MED ORDER — HYDRALAZINE HCL 25 MG PO TABS: 50.0000 mg | ORAL_TABLET | Freq: Three times a day (TID) | ORAL | 11 refills | Status: DC

## 2016-05-28 MED ORDER — BENZONATATE 100 MG PO CAPS: 100.0000 mg | ORAL_CAPSULE | Freq: Three times a day (TID) | ORAL | 0 refills | Status: DC | PRN

## 2016-05-28 NOTE — Telephone Encounter (Signed)
Received refill request for benzonatate (TESSALON PERLES) 100 MG capsule. Last refill and office visit 02/07/16. Is it ok to refill this? Please advise.

## 2016-05-30 ENCOUNTER — Other Ambulatory Visit: Payer: Self-pay | Admitting: Family Medicine

## 2016-05-31 ENCOUNTER — Other Ambulatory Visit: Payer: Self-pay | Admitting: Emergency Medicine

## 2016-05-31 MED ORDER — HYDROXYZINE HCL 25 MG PO TABS: ORAL_TABLET | ORAL | 1 refills | Status: DC

## 2016-06-05 ENCOUNTER — Other Ambulatory Visit: Payer: Self-pay | Admitting: Endocrinology

## 2016-06-05 NOTE — Telephone Encounter (Signed)
Please refill x 3 mos Ov is due 

## 2016-06-12 ENCOUNTER — Other Ambulatory Visit: Payer: Self-pay | Admitting: Family Medicine

## 2016-06-12 ENCOUNTER — Encounter: Payer: Self-pay | Admitting: Family Medicine

## 2016-06-12 ENCOUNTER — Ambulatory Visit (INDEPENDENT_AMBULATORY_CARE_PROVIDER_SITE_OTHER): Payer: Medicare Other | Admitting: Family Medicine

## 2016-06-12 VITALS — BP 105/60 | HR 87 | Temp 98.0°F | Ht 60.0 in | Wt 184.8 lb

## 2016-06-12 DIAGNOSIS — Z5181 Encounter for therapeutic drug level monitoring: Secondary | ICD-10-CM | POA: Diagnosis not present

## 2016-06-12 DIAGNOSIS — I952 Hypotension due to drugs: Secondary | ICD-10-CM

## 2016-06-12 DIAGNOSIS — E119 Type 2 diabetes mellitus without complications: Secondary | ICD-10-CM

## 2016-06-12 DIAGNOSIS — I10 Essential (primary) hypertension: Secondary | ICD-10-CM

## 2016-06-12 DIAGNOSIS — R600 Localized edema: Secondary | ICD-10-CM

## 2016-06-12 LAB — COMPREHENSIVE METABOLIC PANEL
ALT: 25 U/L (ref 0–35)
AST: 26 U/L (ref 0–37)
Albumin: 4 g/dL (ref 3.5–5.2)
Alkaline Phosphatase: 103 U/L (ref 39–117)
BUN: 22 mg/dL (ref 6–23)
CO2: 28 mEq/L (ref 19–32)
Calcium: 9.3 mg/dL (ref 8.4–10.5)
Chloride: 96 mEq/L (ref 96–112)
Creatinine, Ser: 1.13 mg/dL (ref 0.40–1.20)
GFR: 51.04 mL/min — ABNORMAL LOW (ref >60.00)
Glucose, Bld: 259 mg/dL — ABNORMAL HIGH (ref 70–99)
Potassium: 3.6 mEq/L (ref 3.5–5.1)
Sodium: 136 mEq/L (ref 135–145)
Total Bilirubin: 0.3 mg/dL (ref 0.2–1.2)
Total Protein: 7.9 g/dL (ref 6.0–8.3)

## 2016-06-12 LAB — CBC
HCT: 40.7 % (ref 36.0–46.0)
Hemoglobin: 13.4 g/dL (ref 12.0–15.0)
MCHC: 33 g/dL (ref 30.0–36.0)
MCV: 78.7 fl (ref 78.0–100.0)
Platelets: 313 10*3/uL (ref 150.0–400.0)
RBC: 5.17 Mil/uL — ABNORMAL HIGH (ref 3.87–5.11)
RDW: 14.4 % (ref 11.5–15.5)
WBC: 12.5 10*3/uL — ABNORMAL HIGH (ref 4.0–10.5)

## 2016-06-12 LAB — HEMOGLOBIN A1C: Hgb A1c MFr Bld: 8.8 % — ABNORMAL HIGH (ref 4.6–6.5)

## 2016-06-12 NOTE — Patient Instructions (Addendum)
Your blood pressure is a bit too low- please decrease your amlodipine by 1/2- instead of taking a whole 10mg  pill take just 1/2 or 5 mg  Please come and see me in about 6 weeks- I will be in touch with your labs

## 2016-06-12 NOTE — Progress Notes (Signed)
[place] at [facility] [address], [place], [place] [address] [phone] [phone]  Date:  06/12/2016   Name:  Destiny Ballard   DOB:  [birth year]   MRN:  [id number]  PCP:  Lamar Blinks, MD    Chief Complaint: Follow-up (Pt here for f/u visit. Will need refills. Pt is taking Lasix mediation and it has helped with swelling. Checking fasting blood sugar, pt states that she now takes 14 units daily. Pt has not seen Dr. Loanne Drilling yet. Due for Zostavax and Urine Microalbumin today. )   History of Present Illness:  Destiny Ballard is a 65 y.o. very pleasant female patient who presents with the following:  Last seen by myself in July of this year with the following HPI:  Here today for a hospital follow-up visit.  She was admitted from 7/14 to 7/18.  Per discharge instructions: Needs a repeat BMP, follow-up lipids and DM. She was admitted for CP, had a cath on 7/17 that showed non- obstructive CAD. Recommended aggressive risk factor modification, continue asa, statin, BB. Insulin was started for her uncontrolled glucose and she is still using 10 units daily  She states that she is feeling ok.  She is using metformin and also insulin for her DM.  She has been checking her glucose- it may run 200-300 hundred, highest 400.    She notes a lot of stress at home- her daughter lives in the upstairs part of their home, rent free.  However she is now having other person live with her without asking permission of her mom and dad, the homeowners.  This is very stressful to [name], and she anticipates this getting worse as they plan to evict her. She wonders if she can have "something for my nerves" as she is feeling really anxious and having a hard time sleeping She has noted some pain in her sciatic nerve that runs down the left side of her back into the left thigh.    She needs nitrotabs and some tessalon perles for her cough  She has not seen  endocrinolgoy in several months but agrees to schedule an appt to follow-up her DM She will see her pulmonologist Dr. Malvin Johns 8/8 Cardiology appt 8/18     Wt Readings from Last 3 Encounters:  02/07/16 186 lb 12.8 oz (84.7 kg)  01/30/16 183 lb 12.8 oz (83.4 kg)  12/07/15 180 lb (81.6 kg)   She has noted intermittent edema of her feet and ankles over the last 3-4 months- she often feels like her shoes are a bit tight.  The swelling will develop when she is on her feet all day.  This is worst if she is on a car trip or otherwise sitting still for a long time, gets better overnight. She does not have any known history of CHF.   Recent EF was 60- 65% while in pt.  She is seeing Dr. Lamonte Sakai for her OSA- she has been started on CPAP.  She is still getting used to this.  She continues to cough.  Her recent spirometry shows mixed restriction and obstruction; they gave her albuterol which does help.    Lab Results  Component Value Date   HGBA1C 9.1 (H) 01/27/2016  currently she is using 14 units of insulin detemir once daily, also metformin. Prantin is on her med list but we are not sure if she is taking this or not; she is not sure  Dr. Loanne Drilling  is her endocrinologist: she has not seen him recently but plans to make one soon She does not generally check her glucose at home but has not noted sx of high or low glucose Her weight is stable  Wt Readings from Last 3 Encounters:  06/12/16 184 lb 12.8 oz (83.8 kg)  04/25/16 184 lb (83.5 kg)  03/07/16 183 lb (83 kg)    She is taking lasix every other day generally for her swelling and this seems to keep her feet at their normal size.  She has not noted any adverse effects of this medication  She is not fasting today- she had a biscuit.  Her most recent lipids did not look good- she is on crestor 20 and her lovaza.  Will not check cholesterol as main concern has been her triglycerides and she just had a fatty meal For her BP she is taking several  medication; amlodipine, lasix (prn for swelling), hydralazine, toprol xl, losartan  Patient Active Problem List   Diagnosis Date Noted  . Obstructive lung disease (generalized) ([place]) 04/25/2016  . Essential hypertension 01/30/2016  . High blood triglycerides   . Chest pain 01/28/2016  . Hypokalemia 01/27/2016  . Depression with anxiety   . CAD (coronary artery disease)   . Pain in the chest   . Hypertensive heart disease without CHF   . HLD (hyperlipidemia)   . Type 2 diabetes mellitus without complication, without long-term current use of insulin ([place])   . OSA (obstructive sleep apnea) 05/06/2014  . Dyspnea on exertion 09/02/2012  . Chest pain 12/10/2011  . Chronic cough 05/11/2010  . Obesity (BMI 30-39.9)   . GERD 06/28/2008    Past Medical History:  Diagnosis Date  . Anxiety    Prior suicide attempt  . CAD (coronary artery disease)    a) s/p DES to LAD 07/2005 b) Last Myoview low risk 11/2011 showing small fixed apical perfusion defect (prior MI vs attenuation) but no ischemia - normal EF.  [name] Cervical spondylosis   . Coronary atherosclerosis 06/28/2008  . Depression   . Depression with anxiety   . Diabetes mellitus without complication ([place])   . GERD (gastroesophageal reflux disease)   . Hyperlipidemia   . Hypertension   . Insulin resistance   . Iron deficiency anemia   . Obesity     Past Surgical History:  Procedure Laterality Date  . BREAST ENHANCEMENT SURGERY    . CARDIAC CATHETERIZATION  06/17/2007   NORMAL. EF 60%  . CARDIAC CATHETERIZATION N/A 01/29/2016   Procedure: Left Heart Cath and Coronary Angiography;  Surgeon: Sherren Mocha, MD;  Location: [facility];  Service: Cardiovascular;  Laterality: N/A;  . CERVICAL SPONDYLOSIS     SINGLE LEVEL FUSION  . CHILDBIRTH     X3  . CORONARY STENT PLACEMENT  07/2005   LEFT ANTERIOR DESCENDING  . FOREARM FRACTURE SURGERY  2010   hand and shoulder   . INCISION AND DRAINAGE BREAST ABSCESS  01/05/2012      .  INCISION AND DRAINAGE PERIRECTAL ABSCESS N/A 02/18/2014   Procedure: IRRIGATION AND DEBRIDEMENT PERIRECTAL ABSCESS;  Surgeon: Pedro Earls, MD;  Location: [facility];  Service: General;  Laterality: N/A;  . LUMBAR LAMINECTOMY    . ROTATOR CUFF REPAIR     bilaterla  . TONSILLECTOMY AND ADENOIDECTOMY    . TUBAL LIGATION      Social History  Substance Use Topics  . Smoking status: Never Smoker  . Smokeless tobacco: Never Used  .  Alcohol use Yes     Comment: occ    Family History  Problem Relation Age of Onset  . Heart attack Mother   . Diabetes Mother   . Lung cancer Mother   . Asthma Mother   . Heart disease Mother   . Suicidality Father     "killed himself"  . Asthma Daughter     x 2  . Cancer Daughter     pre-cancerous polyp  . Diabetes Sister   . Cancer Sister   . Cervical cancer Daughter     cervical   . Allergies Other     all family--seasonal allergies    Allergies  Allergen Reactions  . Prednisone Other (See Comments)    REACTION: mood swings, nightmares. "Shot doesn't bother me, reaction is just with the pill" she states she has had the steroid injections before. From our records methylprednisone was given to her in 2013 without any complications.  . Nitroglycerin     Severe headache only- known SE. Pt is able to use this if needed     Medication list has been reviewed and updated.  Current Outpatient Prescriptions on File Prior to Visit  Medication Sig Dispense Refill  . albuterol (PROVENTIL HFA;VENTOLIN HFA) 108 (90 Base) MCG/ACT inhaler Inhale 2 puffs into the lungs every 6 (six) hours as needed for wheezing or shortness of breath. 1 Inhaler 6  . amLODipine (NORVASC) 10 MG tablet TAKE 1 TABLET BY MOUTH EVERY DAY 90 tablet 1  . aspirin EC 81 MG tablet Take 81 mg by mouth daily.    . benzonatate (TESSALON PERLES) 100 MG capsule Take 1 capsule (100 mg total) by mouth 3 (three) times daily as needed for cough. 60 capsule 0  . bromocriptine (PARLODEL) 2.5 MG  tablet TAKE 1/2 TABLET (1.25 MG TOTAL) BY MOUTH AT BEDTIME. (Patient taking differently: TAKE 1 TABLET (1.25 MG TOTAL) BY MOUTH AT BEDTIME.) 45 tablet 1  . cetirizine (ZYRTEC) 10 MG tablet TAKE 1 TABLET BY MOUTH EVERY DAY 30 tablet 7  . Cholecalciferol (VITAMIN D3) 1000 UNITS CAPS Take 1,000 Units by mouth daily.     . clonazePAM (KLONOPIN) 0.5 MG tablet Take 0.5 tablets (0.25 mg total) by mouth 2 (two) times daily as needed for anxiety. 20 tablet 1  . CRESTOR 20 MG tablet TAKE 1 TABLET BY MOUTH EVERY DAY 90 tablet 3  . Cyanocobalamin (VITAMIN B 12 PO) Take 500 mg by mouth 2 (two) times daily.    . diphenhydramine-acetaminophen (TYLENOL PM) 25-500 MG TABS Take 1 tablet by mouth at bedtime.     . fexofenadine (ALLEGRA) 180 MG tablet Take 180 mg by mouth 2 (two) times daily.     [name] FLUoxetine (PROZAC) 40 MG capsule Take 1 capsule (40 mg total) by mouth daily. 90 capsule 1  . gabapentin (NEURONTIN) 300 MG capsule Take 300 mg by mouth 2 (two) times daily.    . hydrALAZINE (APRESOLINE) 25 MG tablet Take 2 tablets (50 mg total) by mouth 3 (three) times daily. 180 tablet 11  . hydrOXYzine (ATARAX/VISTARIL) 25 MG tablet TAKE 1/2 TO 1 TABLET AT BEDTIME AS NEEDED FOR ITCHING 30 tablet 1  . Insulin Detemir (LEVEMIR FLEXTOUCH) 100 UNIT/ML Pen Inject 10 Units into the skin daily. 15 mL 3  . Insulin Pen Needle 31G X 5 MM MISC Use to inject insulin as instructed once a day 100 each 1  . meclizine (ANTIVERT) 12.5 MG tablet Take 12.5 mg by mouth 3 (three) times daily  as needed for dizziness.    . metFORMIN (GLUCOPHAGE-XR) 500 MG 24 hr tablet TAKE 2 TABLETS BY MOUTH EVERY DAY WITH BREAKFAST 180 tablet 0  . metoprolol succinate (TOPROL-XL) 100 MG 24 hr tablet Take 1 tablet (100 mg total) by mouth daily. 90 tablet 3  . montelukast (SINGULAIR) 10 MG tablet TAKE 1 TABLET (10 MG TOTAL) BY MOUTH AT BEDTIME. 90 tablet 3  . nitroGLYCERIN (NITROSTAT) 0.4 MG SL tablet Place 0.4 mg under the tongue every 5 (five) minutes as  needed.    [name] omega-3 acid ethyl esters (LOVAZA) 1 g capsule Take 1 capsule (1 g total) by mouth 2 (two) times daily. 60 capsule 11  . repaglinide (PRANDIN) 2 MG tablet Take 1 tablet (2 mg total) by mouth 3 (three) times daily before meals. 90 tablet 11  . vitamin E 400 UNIT capsule Take 400 Units by mouth daily.    [name] losartan (COZAAR) 50 MG tablet Take 1 tablet (50 mg total) by mouth daily. 30 tablet 6  . pantoprazole (PROTONIX) 40 MG tablet Take 1 tablet (40 mg total) by mouth daily. 30 tablet 3   No current facility-administered medications on file prior to visit.     Review of Systems:  As per HPI- otherwise negative. She has felt tired, but not lightheaded or dizzy.  She does not check her home BP so she is not sure if running low at home also.  BP Readings from Last 3 Encounters:  06/12/16 (!) 106/50  04/25/16 (!) 158/78  03/01/16 (!) 182/80    Physical Examination: Vitals:   06/12/16 1121  BP: (!) 106/50  Pulse: 87  Temp: 98 F (36.7 C)    Vitals:   06/12/16 1121  Weight: 184 lb 12.8 oz (83.8 kg)  Height: 5' (1.524 m)   Body mass index is 36.09 kg/m. Ideal Body Weight: Weight in (lb) to have BMI = 25: 127.7  GEN: WDWN, NAD, Non-toxic, A & O x 3, obese, looks well HEENT: Atraumatic, Normocephalic. Neck supple. No masses, No LAD. Ears and Nose: No external deformity. CV: RRR, No M/G/R. No JVD. No thrill. No extra heart sounds. PULM: CTA B, no wheezes, crackles, rhonchi. No retractions. No resp. distress. No accessory muscle use. ABD: S, NT, ND. No rebound. No HSM. EXTR: No c/c/e- no swelling at all in either leg NEURO Normal gait.  PSYCH: Normally interactive. Conversant. Not depressed or anxious appearing.  Calm demeanor.    Assessment and Plan: Type 2 diabetes mellitus without complication, without long-term current use of insulin ([place]) - Plan: Urine Microalbumin w/creat. ratio, Comprehensive metabolic panel, Hemoglobin A1C  Essential hypertension,  benign  Medication monitoring encounter - Plan: CBC  Hypotension due to drugs  Here today for a follow-up visit.  She has not been seeing her endocrinologist regularly.  Will check her A1c and other basic labs for her today Her BP is running low- will have her decrease her amlodipine to 5 mg and see me in 6 weeks  Will plan further follow- up pending labs.  Results for orders placed or performed in visit on 06/12/16  CBC  Result Value Ref Range   WBC 12.5 (H) 4.0 - 10.5 K/uL   RBC 5.17 (H) 3.87 - 5.11 Mil/uL   Platelets 313.0 150.0 - 400.0 K/uL   Hemoglobin 13.4 12.0 - 15.0 g/dL   HCT 40.7 36.0 - 46.0 %   MCV 78.7 78.0 - 100.0 fl   MCHC 33.0 30.0 - 36.0 g/dL  RDW 14.4 11.5 - 15.5 %  Comprehensive metabolic panel  Result Value Ref Range   Sodium 136 135 - 145 mEq/L   Potassium 3.6 3.5 - 5.1 mEq/L   Chloride 96 96 - 112 mEq/L   CO2 28 19 - 32 mEq/L   Glucose, Bld 259 (H) 70 - 99 mg/dL   BUN 22 6 - 23 mg/dL   Creatinine, Ser 1.13 0.40 - 1.20 mg/dL   Total Bilirubin 0.3 0.2 - 1.2 mg/dL   Alkaline Phosphatase 103 39 - 117 U/L   AST 26 0 - 37 U/L   ALT 25 0 - 35 U/L   Total Protein 7.9 6.0 - 8.3 g/dL   Albumin 4.0 3.5 - 5.2 g/dL   Calcium 9.3 8.4 - 10.5 mg/dL   GFR 51.04 (L) >60.00 mL/min  Hemoglobin A1C  Result Value Ref Range   Hgb A1c MFr Bld 8.8 (H) 4.6 - 6.5 %     Signed Lamar Blinks, MD

## 2016-06-16 ENCOUNTER — Encounter: Payer: Self-pay | Admitting: Family Medicine

## 2016-07-21 ENCOUNTER — Other Ambulatory Visit: Payer: Self-pay | Admitting: Family Medicine

## 2016-07-21 DIAGNOSIS — R053 Chronic cough: Secondary | ICD-10-CM

## 2016-07-21 DIAGNOSIS — R05 Cough: Secondary | ICD-10-CM

## 2016-07-22 ENCOUNTER — Other Ambulatory Visit: Payer: Self-pay | Admitting: Emergency Medicine

## 2016-07-22 DIAGNOSIS — R05 Cough: Secondary | ICD-10-CM

## 2016-07-22 DIAGNOSIS — R053 Chronic cough: Secondary | ICD-10-CM

## 2016-07-22 MED ORDER — BENZONATATE 100 MG PO CAPS: 100.0000 mg | ORAL_CAPSULE | Freq: Three times a day (TID) | ORAL | 0 refills | Status: DC | PRN

## 2016-07-24 ENCOUNTER — Ambulatory Visit (INDEPENDENT_AMBULATORY_CARE_PROVIDER_SITE_OTHER): Payer: Medicare Other | Admitting: Family Medicine

## 2016-07-24 ENCOUNTER — Encounter: Payer: Self-pay | Admitting: Family Medicine

## 2016-07-24 VITALS — BP 128/80 | HR 100 | Temp 98.4°F | Ht 60.0 in | Wt 187.2 lb

## 2016-07-24 DIAGNOSIS — I10 Essential (primary) hypertension: Secondary | ICD-10-CM

## 2016-07-24 DIAGNOSIS — E118 Type 2 diabetes mellitus with unspecified complications: Secondary | ICD-10-CM

## 2016-07-24 DIAGNOSIS — IMO0002 Reserved for concepts with insufficient information to code with codable children: Secondary | ICD-10-CM

## 2016-07-24 DIAGNOSIS — Z794 Long term (current) use of insulin: Secondary | ICD-10-CM | POA: Diagnosis not present

## 2016-07-24 DIAGNOSIS — E1165 Type 2 diabetes mellitus with hyperglycemia: Secondary | ICD-10-CM | POA: Diagnosis not present

## 2016-07-24 DIAGNOSIS — Z5181 Encounter for therapeutic drug level monitoring: Secondary | ICD-10-CM

## 2016-07-24 DIAGNOSIS — Z23 Encounter for immunization: Secondary | ICD-10-CM

## 2016-07-24 DIAGNOSIS — E663 Overweight: Secondary | ICD-10-CM | POA: Diagnosis not present

## 2016-07-24 LAB — MICROALBUMIN / CREATININE URINE RATIO
Creatinine,U: 25.2 mg/dL
Microalb Creat Ratio: 2.8 mg/g (ref 0.0–30.0)
Microalb, Ur: <0.7 mg/dL (ref 0.0–1.9)

## 2016-07-24 NOTE — Patient Instructions (Addendum)
It was good to see you today- we will set you up for diabetes education. Better diet will help your lose weight and control your blood sugar You got your last pneumonia shot today Please ask Dr. Lamonte Sakai about your breathing concerns  Please see me in 6 weeks to repeat labs and A1c- I hope that we will see improvement of your A1c by then! Your BP looks fine on the lower dose of medication

## 2016-07-24 NOTE — Progress Notes (Signed)
[place] at [facility] [address], [place],  [address] [phone]  Date:  07/24/2016   Name:  Destiny Ballard   DOB:  March 18, 1951   MRN:  [id number]  PCP:  Lamar Blinks, MD    Chief Complaint: Follow-up (Pt here for 6 week f/u. )   History of Present Illness:  Destiny Ballard is a 66 y.o. very pleasant female patient who presents with the following:  She is here today to recheck- last here 6 weeks ago and we lowered her BP meds as she was running too low  BP Readings from Last 3 Encounters:  07/24/16 138/82  06/12/16 105/60  04/25/16 (!) 158/78   Her BP looks ok today- she decreased her amlodipine from 10 mg to 5 mg a day. She has not noted any sx of hypotension such as lightheadedness, syncope She has not been using CPAP as she could not tolerate it- it made her cough and shecould not breathe well at night She has noted a ST for 2 days No fever noted- she does cough   Her glucose has been 150- 200 She is on pranatin, metformin, levemir- she is on 18 units, she went up from 10 units recently as her glucose was high.  Admits that she has been eating a lot of "junk food" recently and has been stressed as her husband has been ill She had not seen Dr. Loanne Drilling in a long time She has not noted particular sx of hyper or hypoglycemia  Lab Results  Component Value Date   HGBA1C 8.8 (H) 06/12/2016    She does not check home BP Needs a urine micro albumin today Flu shot is UTD Needs prevnar- it appears that she had the pneumovax 23 in both 2015 and 2016.  Would like to have prevnar 13 today  Patient Active Problem List   Diagnosis Date Noted  . Obstructive lung disease (generalized) ([place]) 04/25/2016  . Essential hypertension 01/30/2016  . High blood triglycerides   . Chest pain 01/28/2016  . Hypokalemia 01/27/2016  . Depression with anxiety   . CAD (coronary artery disease)   . Pain in the chest   .  Hypertensive heart disease without CHF   . HLD (hyperlipidemia)   . Type 2 diabetes mellitus without complication, without long-term current use of insulin ([place])   . OSA (obstructive sleep apnea) 05/06/2014  . Dyspnea on exertion 09/02/2012  . Chest pain 12/10/2011  . Chronic cough 05/11/2010  . Obesity (BMI 30-39.9)   . GERD 06/28/2008    Past Medical History:  Diagnosis Date  . Anxiety    Prior suicide attempt  . CAD (coronary artery disease)    a) s/p DES to LAD 07/2005 b) Last Myoview low risk 11/2011 showing small fixed apical perfusion defect (prior MI vs attenuation) but no ischemia - normal EF.  [name] Cervical spondylosis   . Coronary atherosclerosis 06/28/2008  . Depression   . Depression with anxiety   . Diabetes mellitus without complication ([place])   . GERD (gastroesophageal reflux disease)   . Hyperlipidemia   . Hypertension   . Insulin resistance   . Iron deficiency anemia   . Obesity     Past Surgical History:  Procedure Laterality Date  . BREAST ENHANCEMENT SURGERY    . CARDIAC CATHETERIZATION  06/17/2007   NORMAL. EF 60%  . CARDIAC CATHETERIZATION N/A 01/29/2016   Procedure: Left Heart Cath and Coronary Angiography;  Surgeon: Sherren Mocha, MD;  Location: [facility];  Service: Cardiovascular;  Laterality: N/A;  . CERVICAL SPONDYLOSIS     SINGLE LEVEL FUSION  . CHILDBIRTH     X3  . CORONARY STENT PLACEMENT  07/2005   LEFT ANTERIOR DESCENDING  . FOREARM FRACTURE SURGERY  2010   hand and shoulder   . INCISION AND DRAINAGE BREAST ABSCESS  01/05/2012      . INCISION AND DRAINAGE PERIRECTAL ABSCESS N/A 02/18/2014   Procedure: IRRIGATION AND DEBRIDEMENT PERIRECTAL ABSCESS;  Surgeon: Pedro Earls, MD;  Location: [facility];  Service: General;  Laterality: N/A;  . LUMBAR LAMINECTOMY    . ROTATOR CUFF REPAIR     bilaterla  . TONSILLECTOMY AND ADENOIDECTOMY    . TUBAL LIGATION      Social History  Substance Use Topics  . Smoking status: Never Smoker  .  Smokeless tobacco: Never Used  . Alcohol use Yes     Comment: occ    Family History  Problem Relation Age of Onset  . Heart attack Mother   . Diabetes Mother   . Lung cancer Mother   . Asthma Mother   . Heart disease Mother   . Suicidality Father     "killed himself"  . Asthma Daughter     x 2  . Cancer Daughter     pre-cancerous polyp  . Diabetes Sister   . Cancer Sister   . Cervical cancer Daughter     cervical   . Allergies Other     all family--seasonal allergies    Allergies  Allergen Reactions  . Prednisone Other (See Comments)    REACTION: mood swings, nightmares. "Shot doesn't bother me, reaction is just with the pill" she states she has had the steroid injections before. From our records methylprednisone was given to her in 2013 without any complications.  . Nitroglycerin     Severe headache only- known SE. Pt is able to use this if needed     Medication list has been reviewed and updated.  Current Outpatient Prescriptions on File Prior to Visit  Medication Sig Dispense Refill  . albuterol (PROVENTIL HFA;VENTOLIN HFA) 108 (90 Base) MCG/ACT inhaler Inhale 2 puffs into the lungs every 6 (six) hours as needed for wheezing or shortness of breath. 1 Inhaler 6  . amLODipine (NORVASC) 10 MG tablet TAKE 1 TABLET BY MOUTH EVERY DAY 90 tablet 1  . aspirin EC 81 MG tablet Take 81 mg by mouth daily.    . benzonatate (TESSALON PERLES) 100 MG capsule Take 1 capsule (100 mg total) by mouth 3 (three) times daily as needed for cough. 60 capsule 0  . bromocriptine (PARLODEL) 2.5 MG tablet TAKE 1/2 TABLET (1.25 MG TOTAL) BY MOUTH AT BEDTIME. (Patient taking differently: TAKE 1 TABLET (1.25 MG TOTAL) BY MOUTH AT BEDTIME.) 45 tablet 1  . cetirizine (ZYRTEC) 10 MG tablet TAKE 1 TABLET BY MOUTH EVERY DAY 30 tablet 7  . Cholecalciferol (VITAMIN D3) 1000 UNITS CAPS Take 1,000 Units by mouth daily.     . clonazePAM (KLONOPIN) 0.5 MG tablet Take 0.5 tablets (0.25 mg total) by mouth 2 (two)  times daily as needed for anxiety. 20 tablet 1  . CRESTOR 20 MG tablet TAKE 1 TABLET BY MOUTH EVERY DAY 90 tablet 3  . Cyanocobalamin (VITAMIN B 12 PO) Take 500 mg by mouth 2 (two) times daily.    . diphenhydramine-acetaminophen (TYLENOL PM) 25-500 MG TABS Take 1 tablet by mouth at bedtime.     [name]  fexofenadine (ALLEGRA) 180 MG tablet Take 180 mg by mouth 2 (two) times daily.     [name] FLUoxetine (PROZAC) 40 MG capsule Take 1 capsule (40 mg total) by mouth daily. 90 capsule 1  . furosemide (LASIX) 20 MG tablet TAKE ONE TABLET DAILY IF NEEDED FOR LEG SWELLING 30 tablet 1  . gabapentin (NEURONTIN) 300 MG capsule Take 300 mg by mouth 2 (two) times daily.    . hydrALAZINE (APRESOLINE) 25 MG tablet Take 2 tablets (50 mg total) by mouth 3 (three) times daily. 180 tablet 11  . hydrOXYzine (ATARAX/VISTARIL) 25 MG tablet TAKE 1/2 TO 1 TABLET AT BEDTIME AS NEEDED FOR ITCHING 30 tablet 1  . Insulin Detemir (LEVEMIR FLEXTOUCH) 100 UNIT/ML Pen Inject 10 Units into the skin daily. 15 mL 3  . Insulin Pen Needle 31G X 5 MM MISC Use to inject insulin as instructed once a day 100 each 1  . meclizine (ANTIVERT) 12.5 MG tablet Take 12.5 mg by mouth 3 (three) times daily as needed for dizziness.    . metFORMIN (GLUCOPHAGE-XR) 500 MG 24 hr tablet TAKE 2 TABLETS BY MOUTH EVERY DAY WITH BREAKFAST 180 tablet 0  . metoprolol succinate (TOPROL-XL) 100 MG 24 hr tablet Take 1 tablet (100 mg total) by mouth daily. 90 tablet 3  . montelukast (SINGULAIR) 10 MG tablet TAKE 1 TABLET (10 MG TOTAL) BY MOUTH AT BEDTIME. 90 tablet 3  . nitroGLYCERIN (NITROSTAT) 0.4 MG SL tablet Place 0.4 mg under the tongue every 5 (five) minutes as needed.    [name] omega-3 acid ethyl esters (LOVAZA) 1 g capsule Take 1 capsule (1 g total) by mouth 2 (two) times daily. 60 capsule 11  . repaglinide (PRANDIN) 2 MG tablet Take 1 tablet (2 mg total) by mouth 3 (three) times daily before meals. 90 tablet 11  . vitamin E 400 UNIT capsule Take 400 Units by mouth daily.     [name] losartan (COZAAR) 50 MG tablet Take 1 tablet (50 mg total) by mouth daily. 30 tablet 6  . pantoprazole (PROTONIX) 40 MG tablet Take 1 tablet (40 mg total) by mouth daily. 30 tablet 3   No current facility-administered medications on file prior to visit.     Review of Systems:  As per HPI- otherwise negative. No CP, SOB, rash, ST, fever, chills  Physical Examination: Vitals:   07/24/16 0956  BP: 138/82  Pulse: 100  Temp: 98.4 F (36.9 C)   Vitals:   07/24/16 0956  Weight: 187 lb 3.2 oz (84.9 kg)  Height: 5' (1.524 m)   Body mass index is 36.56 kg/m. Ideal Body Weight: Weight in (lb) to have BMI = 25: 127.7  GEN: WDWN, NAD, Non-toxic, A & O x 3, obese, looks well HEENT: Atraumatic, Normocephalic. Neck supple. No masses, No LAD.  .rn Ears and Nose: No external deformity. CV: RRR, No M/G/R. No JVD. No thrill. No extra heart sounds. PULM: CTA B, no wheezes, crackles, rhonchi. No retractions. No resp. distress. No accessory muscle use. ABD: S, NT, ND EXTR: No c/c/e NEURO Normal gait.  PSYCH: Normally interactive. Conversant. Not depressed or anxious appearing.  Calm demeanor.    Assessment and Plan: Uncontrolled type 2 diabetes mellitus with complication, with long-term current use of insulin ([place]) - Plan: Ambulatory referral to diabetic education, Urine Microalbumin w/creat. ratio  Essential hypertension, benign  Medication monitoring encounter  Overweight  Immunization due - Plan: Pneumococcal conjugate vaccine 13-valent IM  Here today to discuss a few concerns Her BP  is better on lower dose of medication, no sx of hypotension. Continue current dose prevnar 13 today Discussed her diet- she is really not sure of what to eat to lose weight and control her DM . Referral to DM education She notes that she will have a hard time breathing. She will discuss with Dr. Lamonte Sakai next week   Signed Lamar Blinks, MD

## 2016-07-24 NOTE — Progress Notes (Signed)
Pre visit review using our clinic review tool, if applicable. No additional management support is needed unless otherwise documented below in the visit note. 

## 2016-08-07 ENCOUNTER — Encounter: Payer: Self-pay | Admitting: Emergency Medicine

## 2016-08-07 ENCOUNTER — Ambulatory Visit (INDEPENDENT_AMBULATORY_CARE_PROVIDER_SITE_OTHER): Payer: Medicare Other | Admitting: Emergency Medicine

## 2016-08-07 VITALS — BP 134/80 | HR 113 | Ht 60.0 in | Wt 188.0 lb

## 2016-08-07 DIAGNOSIS — G4733 Obstructive sleep apnea (adult) (pediatric): Secondary | ICD-10-CM | POA: Diagnosis not present

## 2016-08-07 DIAGNOSIS — J310 Chronic rhinitis: Secondary | ICD-10-CM

## 2016-08-07 DIAGNOSIS — R053 Chronic cough: Secondary | ICD-10-CM

## 2016-08-07 DIAGNOSIS — R0600 Dyspnea, unspecified: Secondary | ICD-10-CM

## 2016-08-07 DIAGNOSIS — J449 Chronic obstructive pulmonary disease, unspecified: Secondary | ICD-10-CM | POA: Diagnosis not present

## 2016-08-07 DIAGNOSIS — R05 Cough: Secondary | ICD-10-CM | POA: Diagnosis not present

## 2016-08-07 DIAGNOSIS — R0609 Other forms of dyspnea: Secondary | ICD-10-CM

## 2016-08-07 DIAGNOSIS — K219 Gastro-esophageal reflux disease without esophagitis: Secondary | ICD-10-CM

## 2016-08-07 HISTORY — DX: Chronic rhinitis: J31.0

## 2016-08-07 MED ORDER — FLUTICASONE PROPIONATE 50 MCG/ACT NA SUSP: 2.0000 | Freq: Every day | NASAL | 5 refills | Status: DC

## 2016-08-07 NOTE — Assessment & Plan Note (Signed)
Continue Singulair and Zyrtec. I would like to add a nasal steroid to see if we can get better control. She does have continued congestion and drainage.

## 2016-08-07 NOTE — Assessment & Plan Note (Signed)
She was unable to tolerate CPAP but it was mainly because she had continuous coughing. She seemed to tolerate the mask when her cough was quiet. I believe that we will be able to retry it once we get her cough under control.

## 2016-08-07 NOTE — Assessment & Plan Note (Signed)
Her main complaint is cough. Unclear how much obstructive lung disease is contributing at this time. I have asked her to continue to use her albuterol periodically to see if she benefits. She may yet need to be started on a scheduled bronchodilator.

## 2016-08-07 NOTE — Assessment & Plan Note (Signed)
Increase her Protonix to 40 mg twice a day until our next visit.

## 2016-08-07 NOTE — Assessment & Plan Note (Signed)
Most likely contributions are refractory GERD and rhinitis. She has breakthrough symptoms of both on her current regimen. I will attempt a ramp up therapy. In the meantime we will perform a bronchoscopy to evaluate her upper airway in her lower airways

## 2016-08-07 NOTE — Progress Notes (Signed)
Subjective:    Patient ID: Destiny Ballard, female    DOB: May 26, 1951, 66 y.o.   MRN: [id number]  HPI 66 year old woman, never smoker, with a history of coronary disease, depression, diabetes, GERD, allergic rhinitis. She's been seen in our office previously for evaluation of possible obstructive sleep apnea - never had her PSG. She is also been seen for chronic coughing. She had pulmonary function testing in June 2014 that I have personally reviewed and were consistent with either restriction or mixed disease.  She continues to have every day cough, non-productive.  Continues to have some GERD, is on protonix, still takes Tums. She is on allegra and zyrtec. She is referred back today following recent hospitalization to further eval her dyspnea, possible OSA and cough.   ROV 04/25/16 -- This is a follow-up visit for chronic cough as well as obstructive sleep apnea. She underwent a sleep study on 02/20/16 that I personally reviewed. This showed evidence of severe obstructive sleep apnea, AHI 35.5 per hour. Follow-up titration study on 8/24 showed that she benefited from CPAP at 13 cm of water. Working with APS to get CPAP. Underwent pulmonary function testing today to compare with priors, I have reviewed > shows significant mixed disease. She has been on SABA before, not currently.  Cough persists, about the same. Relies on tessalon prn. Remains on allegra qd, protonix.   ROV 08/07/16 -- Patient is a never smoker who follows up for her history of chronic cough in the setting of rhinitis and GERD. She also has obstructive sleep apnea. Pulmonary function testing performed in October showed mixed obstruction and restriction. Last time we attempted to start CPAP > she has not tolerated due to cough, kept her from being able to keep it on. She continues to have chronic cough. She remains on protonix, has some breakthrough GERD sx. She has been off allegra about 1 month ago. She is on zyrtec qd, singulair qpm.  She has tried albuterol prn, may have benefited some.    Review of Systems As per HPI  Past Medical History:  Diagnosis Date  . Anxiety    Prior suicide attempt  . CAD (coronary artery disease)    a) s/p DES to LAD 07/2005 b) Last Myoview low risk 11/2011 showing small fixed apical perfusion defect (prior MI vs attenuation) but no ischemia - normal EF.  [name] Cervical spondylosis   . Coronary atherosclerosis 06/28/2008  . Depression   . Depression with anxiety   . Diabetes mellitus without complication ([place])   . GERD (gastroesophageal reflux disease)   . Hyperlipidemia   . Hypertension   . Insulin resistance   . Iron deficiency anemia   . Obesity      Family History  Problem Relation Age of Onset  . Heart attack Mother   . Diabetes Mother   . Lung cancer Mother   . Asthma Mother   . Heart disease Mother   . Suicidality Father     "killed himself"  . Asthma Daughter     x 2  . Cancer Daughter     pre-cancerous polyp  . Diabetes Sister   . Cancer Sister   . Cervical cancer Daughter     cervical   . Allergies Other     all family--seasonal allergies     Social History   Social History  . Marital status: Married    Spouse name: Lake Bells  . Number of children: 3  . Years of education: 3  Occupational History  . retired from [employer]     11/2012   Social History Main Topics  . Smoking status: Never Smoker  . Smokeless tobacco: Never Used  . Alcohol use Yes     Comment: occ  . Drug use: No  . Sexual activity: Yes    Partners: Male    Birth control/ protection: None   Other Topics Concern  . Not on file   Social History Narrative   Lives with her husband.  Their eldest daughter lives upstairs.     Allergies  Allergen Reactions  . Prednisone Other (See Comments)    REACTION: mood swings, nightmares. "Shot doesn't bother me, reaction is just with the pill" she states she has had the steroid injections before. From our records methylprednisone was given to her  in 2013 without any complications.  . Nitroglycerin     Severe headache only- known SE. Pt is able to use this if needed      Outpatient Medications Prior to Visit  Medication Sig Dispense Refill  . amLODipine (NORVASC) 10 MG tablet TAKE 1 TABLET BY MOUTH EVERY DAY 90 tablet 1  . aspirin EC 81 MG tablet Take 81 mg by mouth daily.    . benzonatate (TESSALON PERLES) 100 MG capsule Take 1 capsule (100 mg total) by mouth 3 (three) times daily as needed for cough. 60 capsule 0  . bromocriptine (PARLODEL) 2.5 MG tablet TAKE 1/2 TABLET (1.25 MG TOTAL) BY MOUTH AT BEDTIME. (Patient taking differently: TAKE 1 TABLET (1.25 MG TOTAL) BY MOUTH AT BEDTIME.) 45 tablet 1  . cetirizine (ZYRTEC) 10 MG tablet TAKE 1 TABLET BY MOUTH EVERY DAY 30 tablet 7  . Cholecalciferol (VITAMIN D3) 1000 UNITS CAPS Take 1,000 Units by mouth daily.     . clonazePAM (KLONOPIN) 0.5 MG tablet Take 0.5 tablets (0.25 mg total) by mouth 2 (two) times daily as needed for anxiety. 20 tablet 1  . CRESTOR 20 MG tablet TAKE 1 TABLET BY MOUTH EVERY DAY 90 tablet 3  . Cyanocobalamin (VITAMIN B 12 PO) Take 500 mg by mouth 2 (two) times daily.    . diphenhydramine-acetaminophen (TYLENOL PM) 25-500 MG TABS Take 1 tablet by mouth at bedtime.     . fexofenadine (ALLEGRA) 180 MG tablet Take 180 mg by mouth 2 (two) times daily.     [name] FLUoxetine (PROZAC) 40 MG capsule Take 1 capsule (40 mg total) by mouth daily. 90 capsule 1  . furosemide (LASIX) 20 MG tablet TAKE ONE TABLET DAILY IF NEEDED FOR LEG SWELLING 30 tablet 1  . gabapentin (NEURONTIN) 300 MG capsule Take 300 mg by mouth 2 (two) times daily.    . hydrALAZINE (APRESOLINE) 25 MG tablet Take 2 tablets (50 mg total) by mouth 3 (three) times daily. 180 tablet 11  . hydrOXYzine (ATARAX/VISTARIL) 25 MG tablet TAKE 1/2 TO 1 TABLET AT BEDTIME AS NEEDED FOR ITCHING 30 tablet 1  . Insulin Detemir (LEVEMIR FLEXTOUCH) 100 UNIT/ML Pen Inject 10 Units into the skin daily. 15 mL 3  . Insulin Pen Needle  31G X 5 MM MISC Use to inject insulin as instructed once a day 100 each 1  . meclizine (ANTIVERT) 12.5 MG tablet Take 12.5 mg by mouth 3 (three) times daily as needed for dizziness.    . metFORMIN (GLUCOPHAGE-XR) 500 MG 24 hr tablet TAKE 2 TABLETS BY MOUTH EVERY DAY WITH BREAKFAST 180 tablet 0  . metoprolol succinate (TOPROL-XL) 100 MG 24 hr tablet Take 1 tablet (100  mg total) by mouth daily. 90 tablet 3  . montelukast (SINGULAIR) 10 MG tablet TAKE 1 TABLET (10 MG TOTAL) BY MOUTH AT BEDTIME. 90 tablet 3  . nitroGLYCERIN (NITROSTAT) 0.4 MG SL tablet Place 0.4 mg under the tongue every 5 (five) minutes as needed.    [name] omega-3 acid ethyl esters (LOVAZA) 1 g capsule Take 1 capsule (1 g total) by mouth 2 (two) times daily. 60 capsule 11  . repaglinide (PRANDIN) 2 MG tablet Take 1 tablet (2 mg total) by mouth 3 (three) times daily before meals. 90 tablet 11  . vitamin E 400 UNIT capsule Take 400 Units by mouth daily.    [name] losartan (COZAAR) 50 MG tablet Take 1 tablet (50 mg total) by mouth daily. 30 tablet 6  . pantoprazole (PROTONIX) 40 MG tablet Take 1 tablet (40 mg total) by mouth daily. 30 tablet 3  . albuterol (PROVENTIL HFA;VENTOLIN HFA) 108 (90 Base) MCG/ACT inhaler Inhale 2 puffs into the lungs every 6 (six) hours as needed for wheezing or shortness of breath. 1 Inhaler 6   No facility-administered medications prior to visit.         Objective:   Physical Exam Vitals:   08/07/16 1325  BP: 134/80  Pulse: (!) 113  SpO2: 93%  Weight: 188 lb (85.3 kg)  Height: 5' (1.524 m)   Gen: Pleasant, overwt woman, in no distress,  normal affect  ENT: No lesions,  mouth clear,  oropharynx clear, no postnasal drip, M4 airway  Neck: No JVD, no TMG, no carotid bruits  Lungs: No use of accessory muscles, clear without rales or rhonchi  Cardiovascular: RRR, heart sounds normal, no murmur or gallops, no peripheral edema  Musculoskeletal: No deformities, no cyanosis or clubbing  Neuro: alert, non  focal  Skin: Warm, no lesions or rashes       Assessment & Plan:  OSA (obstructive sleep apnea) She was unable to tolerate CPAP but it was mainly because she had continuous coughing. She seemed to tolerate the mask when her cough was quiet. I believe that we will be able to retry it once we get her cough under control.  Chronic cough Most likely contributions are refractory GERD and rhinitis. She has breakthrough symptoms of both on her current regimen. I will attempt a ramp up therapy. In the meantime we will perform a bronchoscopy to evaluate her upper airway in her lower airways  GERD Increase her Protonix to 40 mg twice a day until our next visit.  Chronic rhinitis Continue Singulair and Zyrtec. I would like to add a nasal steroid to see if we can get better control. She does have continued congestion and drainage.  Obstructive lung disease (generalized) ([place]) Her main complaint is cough. Unclear how much obstructive lung disease is contributing at this time. I have asked her to continue to use her albuterol periodically to see if she benefits. She may yet need to be started on a scheduled bronchodilator.  Baltazar Apo, MD, PhD 08/07/2016, 1:49 PM [facility] [phone] or if no answer [phone]

## 2016-08-07 NOTE — Patient Instructions (Addendum)
Please increase your protonix to 40mg  twice a day Please continue your zyrtec and singulair as you are taking them  Start fluticasone nasal spray, 2 sprays each nostril daily Continue to use tessalon perles as needed for cough suppression We will work in the future to get you back on CPAP once your cough is improving We will set up a bronchoscopy to evaluate your airways and throat.  Follow with Dr Lamonte Sakai in 1 month

## 2016-08-13 ENCOUNTER — Ambulatory Visit (HOSPITAL_COMMUNITY)
Admission: RE | Admit: 2016-08-13 | Discharge: 2016-08-13 | Disposition: A | Discharge status: Home / Self Care | Payer: Medicare Other | Source: Ambulatory Visit | Attending: Emergency Medicine | Admitting: Emergency Medicine

## 2016-08-13 ENCOUNTER — Encounter (HOSPITAL_COMMUNITY): Payer: Self-pay | Admitting: Respiratory Therapy

## 2016-08-13 ENCOUNTER — Encounter (HOSPITAL_COMMUNITY)
Admission: RE | Disposition: A | Discharge status: Home / Self Care | Payer: Self-pay | Source: Ambulatory Visit | Attending: Emergency Medicine

## 2016-08-13 DIAGNOSIS — I2510 Atherosclerotic heart disease of native coronary artery without angina pectoris: Secondary | ICD-10-CM | POA: Insufficient documentation

## 2016-08-13 DIAGNOSIS — R05 Cough: Secondary | ICD-10-CM | POA: Diagnosis not present

## 2016-08-13 DIAGNOSIS — Z7982 Long term (current) use of aspirin: Secondary | ICD-10-CM | POA: Diagnosis not present

## 2016-08-13 DIAGNOSIS — J449 Chronic obstructive pulmonary disease, unspecified: Secondary | ICD-10-CM | POA: Insufficient documentation

## 2016-08-13 DIAGNOSIS — R053 Chronic cough: Secondary | ICD-10-CM

## 2016-08-13 DIAGNOSIS — E119 Type 2 diabetes mellitus without complications: Secondary | ICD-10-CM | POA: Insufficient documentation

## 2016-08-13 DIAGNOSIS — K219 Gastro-esophageal reflux disease without esophagitis: Secondary | ICD-10-CM | POA: Diagnosis not present

## 2016-08-13 DIAGNOSIS — J384 Edema of larynx: Secondary | ICD-10-CM | POA: Diagnosis not present

## 2016-08-13 DIAGNOSIS — I10 Essential (primary) hypertension: Secondary | ICD-10-CM | POA: Insufficient documentation

## 2016-08-13 DIAGNOSIS — J310 Chronic rhinitis: Secondary | ICD-10-CM | POA: Diagnosis not present

## 2016-08-13 DIAGNOSIS — Z794 Long term (current) use of insulin: Secondary | ICD-10-CM | POA: Diagnosis not present

## 2016-08-13 DIAGNOSIS — G4733 Obstructive sleep apnea (adult) (pediatric): Secondary | ICD-10-CM | POA: Diagnosis not present

## 2016-08-13 DIAGNOSIS — E785 Hyperlipidemia, unspecified: Secondary | ICD-10-CM | POA: Diagnosis not present

## 2016-08-13 DIAGNOSIS — R848 Other abnormal findings in specimens from respiratory organs and thorax: Secondary | ICD-10-CM | POA: Diagnosis not present

## 2016-08-13 HISTORY — PX: VIDEO BRONCHOSCOPY: SHX5072

## 2016-08-13 LAB — BODY FLUID CELL COUNT WITH DIFFERENTIAL
Eos, Fluid: 1 %
Lymphs, Fluid: 17 %
Monocyte-Macrophage-Serous Fluid: 60 % (ref 50–90)
Neutrophil Count, Fluid: 22 % (ref 0–25)
Total Nucleated Cell Count, Fluid: 50 cu mm (ref 0–1000)

## 2016-08-13 SURGERY — VIDEO BRONCHOSCOPY WITHOUT FLUORO
Anesthesia: Moderate Sedation | Laterality: Bilateral

## 2016-08-13 MED ORDER — FENTANYL CITRATE (PF) 100 MCG/2ML IJ SOLN
INTRAMUSCULAR | Status: DC | PRN
  Administered 2016-08-13: 25 ug via INTRAVENOUS
  Administered 2016-08-13: 50 ug via INTRAVENOUS

## 2016-08-13 MED ORDER — LIDOCAINE HCL 2 % EX GEL
CUTANEOUS | Status: DC | PRN
  Administered 2016-08-13: 1

## 2016-08-13 MED ORDER — LIDOCAINE HCL 2 % EX GEL: 1.0000 "application " | Freq: Once | CUTANEOUS | Status: DC

## 2016-08-13 MED ORDER — BENZONATATE 100 MG PO CAPS: 200.0000 mg | ORAL_CAPSULE | Freq: Three times a day (TID) | ORAL | Status: DC | PRN

## 2016-08-13 MED ORDER — LIDOCAINE HCL 1 % IJ SOLN
INTRAMUSCULAR | Status: DC | PRN
  Administered 2016-08-13: 6 mL via RESPIRATORY_TRACT

## 2016-08-13 MED ORDER — PHENYLEPHRINE HCL 0.25 % NA SOLN
NASAL | Status: DC | PRN
  Administered 2016-08-13: 2 via NASAL

## 2016-08-13 MED ORDER — FENTANYL CITRATE (PF) 100 MCG/2ML IJ SOLN
INTRAMUSCULAR | Status: AC
  Filled 2016-08-13: qty 4

## 2016-08-13 MED ORDER — MIDAZOLAM HCL 10 MG/2ML IJ SOLN
INTRAMUSCULAR | Status: DC | PRN
  Administered 2016-08-13: 2 mg via INTRAVENOUS
  Administered 2016-08-13 (×2): 1 mg via INTRAVENOUS

## 2016-08-13 MED ORDER — PHENYLEPHRINE HCL 0.25 % NA SOLN: 1.0000 | Freq: Four times a day (QID) | NASAL | Status: DC | PRN

## 2016-08-13 MED ORDER — SODIUM CHLORIDE 0.9 % IV SOLN
INTRAVENOUS | Status: DC
  Administered 2016-08-13: 08:00:00 via INTRAVENOUS

## 2016-08-13 MED ORDER — MIDAZOLAM HCL 5 MG/ML IJ SOLN
INTRAMUSCULAR | Status: AC
  Filled 2016-08-13: qty 2

## 2016-08-13 NOTE — Discharge Instructions (Signed)
Flexible Bronchoscopy, Care After These instructions give you information on caring for yourself after your procedure. Your doctor may also give you more specific instructions. Call your doctor if you have any problems or questions after your procedure. Follow these instructions at home:  Do not eat or drink anything for 2 hours after your procedure. If you try to eat or drink before the medicine wears off, food or drink could go into your lungs. You could also burn yourself.  After 2 hours have passed and when you can cough and gag normally, you may eat soft food and drink liquids slowly.  The day after the test, you may eat your normal diet.  You may do your normal activities.  Keep all doctor visits. Get help right away if:  You get more and more short of breath.  You get light-headed.  You feel like you are going to pass out (faint).  You have chest pain.  You have new problems that worry you.  You cough up more than a little blood.  You cough up more blood than before. This information is not intended to replace advice given to you by your health care provider. Make sure you discuss any questions you have with your health care provider. Document Released: 04/28/2009 Document Revised: 12/07/2015 Document Reviewed: 03/05/2013 Elsevier Interactive Patient Education  2017 [employer]  Nothing to eat or drink until     10:30 am today 08/13/2016.  PLEASE CALL OUR OFFICE FOR ANY QUESTIONS OR CONCERNS. [phone].

## 2016-08-13 NOTE — Interval H&P Note (Signed)
History and Physical Interval Note:  08/13/2016 8:07 AM  Destiny Ballard  has presented today for surgery, with the diagnosis of Cough  The various methods of treatment have been discussed with the patient and family. After consideration of risks, benefits and other options for treatment, the patient has consented to  Procedure(s): VIDEO BRONCHOSCOPY WITHOUT FLUORO (Bilateral) as a surgical intervention .  The patient's history has been reviewed, patient examined, no change in status, stable for surgery.  I have reviewed the patient's chart and labs.  Questions were answered to the patient's satisfaction.    No new issues reported. Continues to cough, non-productive. Vitals:   08/13/16 0752 08/13/16 0755 08/13/16 0800 08/13/16 0805  BP:  (!) 170/69  (!) 172/74  Resp:  _0 Temp:      TempSrc:      SpO2: 94% 96% 98% 98%   Gen: Pleasant, overwt woman, in no distress,  normal affect  ENT: No lesions,  mouth clear,  oropharynx clear, no postnasal drip  Neck: No JVD, no stridor  Lungs: No use of accessory muscles, clear without rales or rhonchi  Cardiovascular: RRR, heart sounds normal, no murmur or gallops, no peripheral edema  Musculoskeletal: No deformities, no cyanosis or clubbing  Neuro: alert, non focal  Skin: Warm, no lesions or rashes  Plan: FOB to assess airway for etiology cough. BAL for cell count / diff, cx's  Baltazar Apo, MD, PhD 08/13/2016, 8:10 AM [facility] [phone] or if no answer [phone]

## 2016-08-13 NOTE — H&P (View-Only) (Signed)
Subjective:    Patient ID: Destiny Ballard, female    DOB: 1950/12/08, 66 y.o.   MRN: [id number]  HPI 66 year old woman, never smoker, with a history of coronary disease, depression, diabetes, GERD, allergic rhinitis. She's been seen in our office previously for evaluation of possible obstructive sleep apnea - never had her PSG. She is also been seen for chronic coughing. She had pulmonary function testing in June 2014 that I have personally reviewed and were consistent with either restriction or mixed disease.  She continues to have every day cough, non-productive.  Continues to have some GERD, is on protonix, still takes Tums. She is on allegra and zyrtec. She is referred back today following recent hospitalization to further eval her dyspnea, possible OSA and cough.   ROV 04/25/16 -- This is a follow-up visit for chronic cough as well as obstructive sleep apnea. She underwent a sleep study on 02/20/16 that I personally reviewed. This showed evidence of severe obstructive sleep apnea, AHI 35.5 per hour. Follow-up titration study on 8/24 showed that she benefited from CPAP at 13 cm of water. Working with APS to get CPAP. Underwent pulmonary function testing today to compare with priors, I have reviewed > shows significant mixed disease. She has been on SABA before, not currently.  Cough persists, about the same. Relies on tessalon prn. Remains on allegra qd, protonix.   ROV 08/07/16 -- Patient is a never smoker who follows up for her history of chronic cough in the setting of rhinitis and GERD. She also has obstructive sleep apnea. Pulmonary function testing performed in October showed mixed obstruction and restriction. Last time we attempted to start CPAP > she has not tolerated due to cough, kept her from being able to keep it on. She continues to have chronic cough. She remains on protonix, has some breakthrough GERD sx. She has been off allegra about 1 month ago. She is on zyrtec qd, singulair qpm.  She has tried albuterol prn, may have benefited some.    Review of Systems As per HPI  Past Medical History:  Diagnosis Date  . Anxiety    Prior suicide attempt  . CAD (coronary artery disease)    a) s/p DES to LAD 07/2005 b) Last Myoview low risk 11/2011 showing small fixed apical perfusion defect (prior MI vs attenuation) but no ischemia - normal EF.  [name] Cervical spondylosis   . Coronary atherosclerosis 06/28/2008  . Depression   . Depression with anxiety   . Diabetes mellitus without complication ([place])   . GERD (gastroesophageal reflux disease)   . Hyperlipidemia   . Hypertension   . Insulin resistance   . Iron deficiency anemia   . Obesity      Family History  Problem Relation Age of Onset  . Heart attack Mother   . Diabetes Mother   . Lung cancer Mother   . Asthma Mother   . Heart disease Mother   . Suicidality Father     "killed himself"  . Asthma Daughter     x 2  . Cancer Daughter     pre-cancerous polyp  . Diabetes Sister   . Cancer Sister   . Cervical cancer Daughter     cervical   . Allergies Other     all family--seasonal allergies     Social History   Social History  . Marital status: Married    Spouse name: Lake Bells  . Number of children: 3  . Years of education: 45  Occupational History  . retired from [employer]     11/2012   Social History Main Topics  . Smoking status: Never Smoker  . Smokeless tobacco: Never Used  . Alcohol use Yes     Comment: occ  . Drug use: No  . Sexual activity: Yes    Partners: Male    Birth control/ protection: None   Other Topics Concern  . Not on file   Social History Narrative   Lives with her husband.  Their eldest daughter lives upstairs.     Allergies  Allergen Reactions  . Prednisone Other (See Comments)    REACTION: mood swings, nightmares. "Shot doesn't bother me, reaction is just with the pill" she states she has had the steroid injections before. From our records methylprednisone was given to her  in 2013 without any complications.  . Nitroglycerin     Severe headache only- known SE. Pt is able to use this if needed      Outpatient Medications Prior to Visit  Medication Sig Dispense Refill  . amLODipine (NORVASC) 10 MG tablet TAKE 1 TABLET BY MOUTH EVERY DAY 90 tablet 1  . aspirin EC 81 MG tablet Take 81 mg by mouth daily.    . benzonatate (TESSALON PERLES) 100 MG capsule Take 1 capsule (100 mg total) by mouth 3 (three) times daily as needed for cough. 60 capsule 0  . bromocriptine (PARLODEL) 2.5 MG tablet TAKE 1/2 TABLET (1.25 MG TOTAL) BY MOUTH AT BEDTIME. (Patient taking differently: TAKE 1 TABLET (1.25 MG TOTAL) BY MOUTH AT BEDTIME.) 45 tablet 1  . cetirizine (ZYRTEC) 10 MG tablet TAKE 1 TABLET BY MOUTH EVERY DAY 30 tablet 7  . Cholecalciferol (VITAMIN D3) 1000 UNITS CAPS Take 1,000 Units by mouth daily.     . clonazePAM (KLONOPIN) 0.5 MG tablet Take 0.5 tablets (0.25 mg total) by mouth 2 (two) times daily as needed for anxiety. 20 tablet 1  . CRESTOR 20 MG tablet TAKE 1 TABLET BY MOUTH EVERY DAY 90 tablet 3  . Cyanocobalamin (VITAMIN B 12 PO) Take 500 mg by mouth 2 (two) times daily.    . diphenhydramine-acetaminophen (TYLENOL PM) 25-500 MG TABS Take 1 tablet by mouth at bedtime.     . fexofenadine (ALLEGRA) 180 MG tablet Take 180 mg by mouth 2 (two) times daily.     [name] FLUoxetine (PROZAC) 40 MG capsule Take 1 capsule (40 mg total) by mouth daily. 90 capsule 1  . furosemide (LASIX) 20 MG tablet TAKE ONE TABLET DAILY IF NEEDED FOR LEG SWELLING 30 tablet 1  . gabapentin (NEURONTIN) 300 MG capsule Take 300 mg by mouth 2 (two) times daily.    . hydrALAZINE (APRESOLINE) 25 MG tablet Take 2 tablets (50 mg total) by mouth 3 (three) times daily. 180 tablet 11  . hydrOXYzine (ATARAX/VISTARIL) 25 MG tablet TAKE 1/2 TO 1 TABLET AT BEDTIME AS NEEDED FOR ITCHING 30 tablet 1  . Insulin Detemir (LEVEMIR FLEXTOUCH) 100 UNIT/ML Pen Inject 10 Units into the skin daily. 15 mL 3  . Insulin Pen Needle  31G X 5 MM MISC Use to inject insulin as instructed once a day 100 each 1  . meclizine (ANTIVERT) 12.5 MG tablet Take 12.5 mg by mouth 3 (three) times daily as needed for dizziness.    . metFORMIN (GLUCOPHAGE-XR) 500 MG 24 hr tablet TAKE 2 TABLETS BY MOUTH EVERY DAY WITH BREAKFAST 180 tablet 0  . metoprolol succinate (TOPROL-XL) 100 MG 24 hr tablet Take 1 tablet (100  mg total) by mouth daily. 90 tablet 3  . montelukast (SINGULAIR) 10 MG tablet TAKE 1 TABLET (10 MG TOTAL) BY MOUTH AT BEDTIME. 90 tablet 3  . nitroGLYCERIN (NITROSTAT) 0.4 MG SL tablet Place 0.4 mg under the tongue every 5 (five) minutes as needed.    [name] omega-3 acid ethyl esters (LOVAZA) 1 g capsule Take 1 capsule (1 g total) by mouth 2 (two) times daily. 60 capsule 11  . repaglinide (PRANDIN) 2 MG tablet Take 1 tablet (2 mg total) by mouth 3 (three) times daily before meals. 90 tablet 11  . vitamin E 400 UNIT capsule Take 400 Units by mouth daily.    [name] losartan (COZAAR) 50 MG tablet Take 1 tablet (50 mg total) by mouth daily. 30 tablet 6  . pantoprazole (PROTONIX) 40 MG tablet Take 1 tablet (40 mg total) by mouth daily. 30 tablet 3  . albuterol (PROVENTIL HFA;VENTOLIN HFA) 108 (90 Base) MCG/ACT inhaler Inhale 2 puffs into the lungs every 6 (six) hours as needed for wheezing or shortness of breath. 1 Inhaler 6   No facility-administered medications prior to visit.         Objective:   Physical Exam Vitals:   08/07/16 1325  BP: 134/80  Pulse: (!) 113  SpO2: 93%  Weight: 188 lb (85.3 kg)  Height: 5' (1.524 m)   Gen: Pleasant, overwt woman, in no distress,  normal affect  ENT: No lesions,  mouth clear,  oropharynx clear, no postnasal drip, M4 airway  Neck: No JVD, no TMG, no carotid bruits  Lungs: No use of accessory muscles, clear without rales or rhonchi  Cardiovascular: RRR, heart sounds normal, no murmur or gallops, no peripheral edema  Musculoskeletal: No deformities, no cyanosis or clubbing  Neuro: alert, non  focal  Skin: Warm, no lesions or rashes       Assessment & Plan:  OSA (obstructive sleep apnea) She was unable to tolerate CPAP but it was mainly because she had continuous coughing. She seemed to tolerate the mask when her cough was quiet. I believe that we will be able to retry it once we get her cough under control.  Chronic cough Most likely contributions are refractory GERD and rhinitis. She has breakthrough symptoms of both on her current regimen. I will attempt a ramp up therapy. In the meantime we will perform a bronchoscopy to evaluate her upper airway in her lower airways  GERD Increase her Protonix to 40 mg twice a day until our next visit.  Chronic rhinitis Continue Singulair and Zyrtec. I would like to add a nasal steroid to see if we can get better control. She does have continued congestion and drainage.  Obstructive lung disease (generalized) ([place]) Her main complaint is cough. Unclear how much obstructive lung disease is contributing at this time. I have asked her to continue to use her albuterol periodically to see if she benefits. She may yet need to be started on a scheduled bronchodilator.  Baltazar Apo, MD, PhD 08/07/2016, 1:49 PM [facility] [phone] or if no answer [phone]

## 2016-08-13 NOTE — Op Note (Signed)
[facility] Cardiopulmonary Patient Name: Destiny Ballard Procedure Date: 08/13/2016 MRN: [id number] Attending MD: Collene Gobble , MD Date of Birth: Jul 15, 1951 CSN: [id number] Age: 66 Admit Type: Outpatient Ethnicity: Not Hispanic or Latino Procedure:            Bronchoscopy Indications:          Chronic cough with normal chest X-ray Providers:            Collene Gobble, MD, Andre Lefort RRT,RCP, Cherre Huger RRT, RCP Referring MD:          Medicines:            Fentanyl 75 mcg IV, Midazolam 4 mg IV, Lidocaine 1%                        applied to cords 8 mL, Lidocaine 1% applied to the                        tracheobronchial tree 12 mL, Oxygen 6 L/min Complications:        No immediate complications Estimated Blood Loss: Estimated blood loss: none. Procedure:      Pre-Anesthesia Assessment:      - A History and Physical has been performed. Patient meds and allergies       have been reviewed. The risks and benefits of the procedure and the       sedation options and risks were discussed with the patient. All       questions were answered and informed consent was obtained. Patient       identification and proposed procedure were verified prior to the       procedure by the physician in the procedure room. Mental Status       Examination: alert and oriented. Airway Examination: normal       oropharyngeal airway. Respiratory Examination: clear to auscultation and       poor air movement. CV Examination: normal. ASA Grade Assessment: II - A       patient with mild systemic disease. After reviewing the risks and       benefits, the patient was deemed in satisfactory condition to undergo       the procedure. The anesthesia plan was to use moderate sedation /       analgesia (conscious sedation). Immediately prior to administration of       medications, the patient was re-assessed for adequacy to receive       sedatives. The heart rate,  respiratory rate, oxygen saturations, blood       pressure, adequacy of pulmonary ventilation, and response to care were       monitored throughout the procedure. The physical status of the patient       was re-assessed after the procedure.      After obtaining informed consent, the bronchoscope was passed under       direct vision. Throughout the procedure, the patient's blood pressure,       pulse, and oxygen saturations were monitored continuously. the [id number]       ([id number]) scope was introduced through the left nostril and advanced to       the tracheobronchial tree. The procedure was accomplished without       difficulty. The patient tolerated the procedure  fairly well. The total       duration of the procedure was 10 minutes. Findings:      Larynx: Laryngeal edema was visualized, most prominently diffusely,       throughout the larynx, on the epiglottis, at the glottis, in the larynx       and in the subglottic space. The edema is not obstructing the airway.       Hyperplastic changes were found diffusely, throughout the larynx. The       changes are not obstructing the airway. GERD findings were visualized       including intra-arytenoid mucosal thickening (posterior laryngitis).      Bronchoalveolar lavage was performed in the RUL apical segment (B1) of       the lung and sent for cell count, bacterial culture, viral smears &       culture, and fungal & AFB analysis and cytology. 60 mL of fluid were       instilled. 17 mL were returned. The return was clear. There were no       mucoid plugs in the return fluid. Impression:      - Chronic cough with normal chest X-ray      - Laryngeal edema.      - Hyperplastic changes were seen diffusely, throughout the larynx.      - Intra-arytenoid mucosal thickening suspected to be secondary to       gastroesophageal reflux disease (GERD) was found.      - Bronchoalveolar lavage was performed.      - The left lung was normal.      - The  right lung was normal. Moderate Sedation:      Moderate (conscious) sedation was personally administered by the       endoscopist. The following parameters were monitored: oxygen saturation,       heart rate, blood pressure, respiratory rate, EKG, adequacy of pulmonary       ventilation, and response to care. Total physician intraservice time was       18 minutes. Recommendation:      - Await BAL, culture and cytology results. Procedure Code(s):      --- Professional ---      [phone], Bronchoscopy, rigid or flexible, including fluoroscopic guidance,       when performed; with bronchial alveolar lavage      99152, Moderate sedation services provided by the same physician or       other qualified health care professional performing the diagnostic or       therapeutic service that the sedation supports, requiring the presence       of an independent trained observer to assist in the monitoring of the       patient's level of consciousness and physiological status; initial 15       minutes of intraservice time, patient age 72 years or older Diagnosis Code(s):      --- Professional ---      R05, Cough CPT copyright 2016 American Medical Association. All rights reserved. The codes documented in this report are preliminary and upon coder review may  be revised to meet current compliance requirements. Collene Gobble, MD Collene Gobble, MD 08/13/2016 8:37:48 AM Number of Addenda: 0 Scope In: 8:15:00 AM Scope Out: 8:25:39 AM

## 2016-08-13 NOTE — Progress Notes (Signed)
Video Bronchoscopy done  Intervention Bronchial Washing done  Procedure tolerated well 

## 2016-08-14 ENCOUNTER — Encounter (HOSPITAL_COMMUNITY): Payer: Self-pay | Admitting: Emergency Medicine

## 2016-08-14 ENCOUNTER — Telehealth: Payer: Self-pay | Admitting: Emergency Medicine

## 2016-08-14 LAB — ACID FAST SMEAR (AFB, MYCOBACTERIA): Acid Fast Smear: NEGATIVE

## 2016-08-14 NOTE — Telephone Encounter (Signed)
Pt aware of RB's recommendations & voiced her understanding. I have advised pt to go to the ED fif her symptoms worsen over night. Nothing further needed.

## 2016-08-14 NOTE — Telephone Encounter (Signed)
Spoke with pt, who states she had a bronch yesterday. pt states she developed some wheezing after procedure, wheezing worsens with laying flat. Pt does have non prod cough, but states this is her baseline. Denies any other symptoms. Pt is wanting to know if wheezing is normal after procedure.   RB please advise. Thanks.

## 2016-08-14 NOTE — Telephone Encounter (Signed)
Sometimes it is, especially if it is coming from her irritated throat. I would like for her to call us tomorrow if the symptoms continue. If so then we may need to treat her to get it to stop.

## 2016-08-15 LAB — CULTURE, BAL-QUANTITATIVE W GRAM STAIN

## 2016-08-15 LAB — CULTURE, BAL-QUANTITATIVE: Culture: 20000 — AB

## 2016-08-19 ENCOUNTER — Other Ambulatory Visit: Payer: Self-pay | Admitting: Family Medicine

## 2016-08-19 DIAGNOSIS — R600 Localized edema: Secondary | ICD-10-CM

## 2016-08-27 ENCOUNTER — Encounter: Payer: Medicare Other | Attending: Family Medicine | Admitting: Registered"

## 2016-08-27 DIAGNOSIS — Z713 Dietary counseling and surveillance: Secondary | ICD-10-CM | POA: Diagnosis not present

## 2016-08-27 DIAGNOSIS — Z794 Long term (current) use of insulin: Secondary | ICD-10-CM | POA: Diagnosis not present

## 2016-08-27 DIAGNOSIS — Z6836 Body mass index (BMI) 36.0-36.9, adult: Secondary | ICD-10-CM | POA: Insufficient documentation

## 2016-08-27 DIAGNOSIS — E118 Type 2 diabetes mellitus with unspecified complications: Secondary | ICD-10-CM | POA: Diagnosis not present

## 2016-08-27 DIAGNOSIS — E119 Type 2 diabetes mellitus without complications: Secondary | ICD-10-CM

## 2016-08-27 NOTE — Patient Instructions (Addendum)
Sleep: Consider taking step to protect sleep; sleeping in the other room, ear plugs.  Avoid screen (TV, computer, phones) 1 hour before bedtime.  Food: If you need a snack in the middle of the night, choose from the snack idea list Eat slowly and pay attention to when you are no longer hungry  Physical activity: Consider getting in some chair activities each day

## 2016-08-27 NOTE — Progress Notes (Signed)
Diabetes Self-Management Education  Visit Type: First/Initial  Appt. Start Time: 0820 Appt. End Time: [id number] 08/27/2016  Destiny Ballard, (prefers to be called "Destiny Ballard") identified by name and date of birth, is a 66 y.o. female with a diagnosis of Diabetes: Type 2.   ASSESSMENT Patient states that since retirement she doesn't have structure in her daily schedule and adjusting her eating and activities around her husband has been stressful. Informed patient that family members frequently come with patients to their visits and she could bring her husband with her to a follow-up appointment if she desires.   She would like to get off some of her medications through making life changes. She also states that she would like to lose some weight. She is not able to use CPAP due to her cough, but the wedge helps her sleep. She reports that she is not a smoker, but has been exposed to second-hand smoke all her life.  Height 5' (1.524 m), weight 189 lb 3.2 oz (85.8 kg). Body mass index is 36.95 kg/m.      Diabetes Self-Management Education - 08/27/16 0845      Visit Information   Visit Type First/Initial     Initial Visit   Diabetes Type Type 2   Are you currently following a meal plan? No   Are you taking your medications as prescribed? Yes   Date Diagnosed 2012  per chart     Health Coping   How would you rate your overall health? Fair     Psychosocial Assessment   Patient Belief/Attitude about Diabetes Motivated to manage diabetes   Other persons present Patient   Patient Concerns Glycemic Control;Weight Control;Nutrition/Meal planning   How often do you need to have someone help you when you read instructions, pamphlets, or other written materials from your doctor or pharmacy? 3 - Sometimes   What is the last grade level you completed in school? 12     Complications   Last HgB A1C per patient/outside source 8.8 %  06/12/16   How often do you check your blood sugar? 1-2  times/day  morning-fasting   Fasting Blood glucose range (mg/dL) >200   Number of hypoglycemic episodes per month 0   Have you had a dilated eye exam in the past 12 months? Yes   Have you had a dental exam in the past 12 months? No   Are you checking your feet? Yes   How many days per week are you checking your feet? 2     Dietary Intake   Breakfast [place] ham club sandwich and fries   Snack (morning) 2 tomato sandwich, white bread OR beef hotdogs, bun OR [place] ice tea, wine couple times a month   Lunch beef liver   Snack (afternoon) potato chips, salty peanuts, fruit, ice cream cone   Dinner bologna   Snack (evening) snack in bed watching TV. jello with fruit, cheese crackers, toosie rolls   Beverage(s) water, 1-2 times week sweet tea and soda     Exercise   Exercise Type ADL's   How many days per week to you exercise? 0   How many minutes per day do you exercise? 0   Total minutes per week of exercise 0     Patient Education   Previous Diabetes Education No   Nutrition management  Role of diet in the treatment of diabetes and the relationship between the three main macronutrients and blood glucose level   Physical activity and  exercise  Helped patient identify appropriate exercises in relation to his/her diabetes, diabetes complications and other health issue.   Psychosocial adjustment Brainstormed with patient on coping mechanisms for social situations, getting support from significant others, dealing with feelings about diabetes     Individualized Goals (developed by patient)   Nutrition General guidelines for healthy choices and portions discussed   Physical Activity 15 minutes per day     Outcomes   Expected Outcomes Demonstrated interest in learning. Expect positive outcomes   Future DMSE 4-6 wks   Program Status Not Completed      Individualized Plan for Diabetes Self-Management Training:   Learning Objective:  Patient will have a greater  understanding of diabetes self-management. Patient education plan is to attend individual and/or group sessions per assessed needs and concerns.   Patient Instructions  Sleep: Consider taking step to protect sleep; sleeping in the other room, ear plugs.  Avoid screen (TV, computer, phones) 1 hour before bedtime.  Food: If you need a snack in the middle of the night, choose from the snack idea list Eat slowly and pay attention to when you are no longer hungry  Physical activity: Consider getting in some chair activities each day  BRAT foods with protein to help with frequent bowel movements.  Return for follow-up visit to learn basics of glucose monitoring and A1c values, carb counting and label reading  Expected Outcomes:  Demonstrated interest in learning. Expect positive outcomes  Education material provided: My Plate, Snack sheet and Support group flyer, arm chair exercises, Counselor list in [place].  If problems or questions, patient to contact team via:  Phone and Email  Future DSME appointment: 4-6 wks

## 2016-08-29 ENCOUNTER — Telehealth: Payer: Self-pay | Admitting: *Deleted

## 2016-08-29 NOTE — Telephone Encounter (Signed)
LM for patient to return call to schedule AWV.   

## 2016-09-04 ENCOUNTER — Encounter: Payer: Self-pay | Admitting: Family Medicine

## 2016-09-04 ENCOUNTER — Other Ambulatory Visit: Payer: Self-pay | Admitting: Endocrinology

## 2016-09-04 ENCOUNTER — Ambulatory Visit (INDEPENDENT_AMBULATORY_CARE_PROVIDER_SITE_OTHER): Payer: Medicare Other | Admitting: Family Medicine

## 2016-09-04 ENCOUNTER — Ambulatory Visit (HOSPITAL_BASED_OUTPATIENT_CLINIC_OR_DEPARTMENT_OTHER)
Admission: RE | Admit: 2016-09-04 | Discharge: 2016-09-04 | Disposition: A | Discharge status: Home / Self Care | Payer: Medicare Other | Source: Ambulatory Visit | Attending: Family Medicine | Admitting: Family Medicine

## 2016-09-04 VITALS — BP 122/63 | HR 76 | Temp 98.2°F | Ht 65.0 in | Wt 189.4 lb

## 2016-09-04 DIAGNOSIS — M79605 Pain in left leg: Secondary | ICD-10-CM

## 2016-09-04 DIAGNOSIS — Z794 Long term (current) use of insulin: Secondary | ICD-10-CM | POA: Diagnosis not present

## 2016-09-04 DIAGNOSIS — M5136 Other intervertebral disc degeneration, lumbar region: Secondary | ICD-10-CM | POA: Diagnosis not present

## 2016-09-04 DIAGNOSIS — E118 Type 2 diabetes mellitus with unspecified complications: Secondary | ICD-10-CM | POA: Diagnosis not present

## 2016-09-04 DIAGNOSIS — D72829 Elevated white blood cell count, unspecified: Secondary | ICD-10-CM

## 2016-09-04 DIAGNOSIS — IMO0002 Reserved for concepts with insufficient information to code with codable children: Secondary | ICD-10-CM

## 2016-09-04 DIAGNOSIS — E1165 Type 2 diabetes mellitus with hyperglycemia: Secondary | ICD-10-CM

## 2016-09-04 DIAGNOSIS — M47816 Spondylosis without myelopathy or radiculopathy, lumbar region: Secondary | ICD-10-CM | POA: Diagnosis not present

## 2016-09-04 DIAGNOSIS — M545 Low back pain, unspecified: Secondary | ICD-10-CM

## 2016-09-04 DIAGNOSIS — I700 Atherosclerosis of aorta: Secondary | ICD-10-CM | POA: Insufficient documentation

## 2016-09-04 MED ORDER — METHOCARBAMOL 500 MG PO TABS: 500.0000 mg | ORAL_TABLET | Freq: Three times a day (TID) | ORAL | 0 refills | Status: DC | PRN

## 2016-09-04 NOTE — Progress Notes (Addendum)
[facility] [address] [place], [place] [address] [phone]  Date:  09/04/2016   Name:  Destiny Ballard   DOB:  [birth year]   MRN:  [id number]  PCP:  Lamar Blinks, MD    Chief Complaint: Follow-up   History of Present Illness:  Destiny Ballard is a 66 y.o. very pleasant female patient who presents with the following:  Here today for a recheck and A1c level Her husband is still ill which is a stressor Her glucose has been better- we will check her A1c today  She did see the nutritionist for her initial appt and will follow-up with her in one month.  She felt that this was helpful!   She is now on 20 units of detemir, metformin 500 BID, prandin  She also notes pain in her lower back and down her left leg.  This will be worse after any exertion such as grocery shopping for the last couple of months.  She has noted some numbness in her left 4th and 5th toes as well since she first had onset of this pain No difficulty with bowel or bladder control  She did have a lumbar MRI back in 2016 IMPRESSION: 1. Mild multifactorial spinal stenosis at L4-5 with asymmetric narrowing of the left lateral recess and possible left L5 nerve root encroachment. 2. No other nerve root encroachment or significant spinal stenosis identified. 3. Tiny left paracentral disc protrusion at L3-4. 4. Mild disc bulging and loss of disc height at L2-3  She has not had any surgery or other procedure on her spine.    Lab Results  Component Value Date   HGBA1C 8.8 (H) 06/12/2016   BP Readings from Last 3 Encounters:  09/04/16 122/63  08/13/16 (!) 146/68  08/07/16 134/80   Wt Readings from Last 3 Encounters:  09/04/16 189 lb 6.4 oz (85.9 kg)  08/27/16 189 lb 3.2 oz (85.8 kg)  08/07/16 188 lb (85.3 kg)     Patient Active Problem List   Diagnosis Date Noted  . Chronic rhinitis 08/07/2016  . Obstructive lung disease (generalized)  ([place]) 04/25/2016  . Essential hypertension 01/30/2016  . High blood triglycerides   . Chest pain 01/28/2016  . Hypokalemia 01/27/2016  . Depression with anxiety   . CAD (coronary artery disease)   . Pain in the chest   . Hypertensive heart disease without CHF   . HLD (hyperlipidemia)   . Type 2 diabetes mellitus without complication, without long-term current use of insulin ([place])   . OSA (obstructive sleep apnea) 05/06/2014  . Dyspnea on exertion 09/02/2012  . Chest pain 12/10/2011  . Chronic cough 05/11/2010  . Obesity (BMI 30-39.9)   . GERD 06/28/2008    Past Medical History:  Diagnosis Date  . Anxiety    Prior suicide attempt  . CAD (coronary artery disease)    a) s/p DES to LAD 07/2005 b) Last Myoview low risk 11/2011 showing small fixed apical perfusion defect (prior MI vs attenuation) but no ischemia - normal EF.  [name] Cervical spondylosis   . Coronary atherosclerosis 06/28/2008  . Depression   . Depression with anxiety   . Diabetes mellitus without complication ([place])   . GERD (gastroesophageal reflux disease)   . Hyperlipidemia   . Hypertension   . Insulin resistance   . Iron deficiency anemia   . Obesity     Past Surgical History:  Procedure Laterality Date  .  BREAST ENHANCEMENT SURGERY    . CARDIAC CATHETERIZATION  06/17/2007   NORMAL. EF 60%  . CARDIAC CATHETERIZATION N/A 01/29/2016   Procedure: Left Heart Cath and Coronary Angiography;  Surgeon: Sherren Mocha, MD;  Location: [facility];  Service: Cardiovascular;  Laterality: N/A;  . CERVICAL SPONDYLOSIS     SINGLE LEVEL FUSION  . CHILDBIRTH     X3  . CORONARY STENT PLACEMENT  07/2005   LEFT ANTERIOR DESCENDING  . FOREARM FRACTURE SURGERY  2010   hand and shoulder   . INCISION AND DRAINAGE BREAST ABSCESS  01/05/2012      . INCISION AND DRAINAGE PERIRECTAL ABSCESS N/A 02/18/2014   Procedure: IRRIGATION AND DEBRIDEMENT PERIRECTAL ABSCESS;  Surgeon: Pedro Earls, MD;  Location: [facility];  Service:  General;  Laterality: N/A;  . LUMBAR LAMINECTOMY    . ROTATOR CUFF REPAIR     bilaterla  . TONSILLECTOMY AND ADENOIDECTOMY    . TUBAL LIGATION    . VIDEO BRONCHOSCOPY Bilateral 08/13/2016   Procedure: VIDEO BRONCHOSCOPY WITHOUT FLUORO;  Surgeon: Collene Gobble, MD;  Location: [facility];  Service: Cardiopulmonary;  Laterality: Bilateral;    Social History  Substance Use Topics  . Smoking status: Never Smoker  . Smokeless tobacco: Never Used  . Alcohol use Yes     Comment: occ    Family History  Problem Relation Age of Onset  . Heart attack Mother   . Diabetes Mother   . Lung cancer Mother   . Asthma Mother   . Heart disease Mother   . Suicidality Father     "killed himself"  . Asthma Daughter     x 2  . Cancer Daughter     pre-cancerous polyp  . Diabetes Sister   . Cancer Sister   . Cervical cancer Daughter     cervical   . Allergies Other     all family--seasonal allergies    Allergies  Allergen Reactions  . Prednisone Other (See Comments)    REACTION: mood swings, nightmares. "Shot doesn't bother me, reaction is just with the pill" she states she has had the steroid injections before. From our records methylprednisone was given to her in 2013 without any complications.  . Nitroglycerin     Severe headache that quickly fades away    Medication list has been reviewed and updated.  Current Outpatient Prescriptions on File Prior to Visit  Medication Sig Dispense Refill  . acetaminophen (TYLENOL) 650 MG CR tablet Take 1,300 mg by mouth every 8 (eight) hours as needed for pain.    [name] albuterol (PROAIR HFA) 108 (90 Base) MCG/ACT inhaler Inhale 2 puffs into the lungs every 4 (four) hours as needed for wheezing or shortness of breath.    [name] amLODipine (NORVASC) 10 MG tablet TAKE 1 TABLET BY MOUTH EVERY DAY 90 tablet 1  . aspirin EC 81 MG tablet Take 81 mg by mouth daily.    . benzonatate (TESSALON PERLES) 100 MG capsule Take 2 capsules (200 mg total) by mouth 3 (three)  times daily as needed for cough.    . bromocriptine (PARLODEL) 2.5 MG tablet TAKE 1/2 TABLET (1.25 MG TOTAL) BY MOUTH AT BEDTIME. 45 tablet 1  . calcium carbonate (TUMS - DOSED IN MG ELEMENTAL CALCIUM) 500 MG chewable tablet Chew 2 tablets by mouth daily as needed for indigestion or heartburn.    . cetirizine (ZYRTEC) 10 MG tablet TAKE 1 TABLET BY MOUTH EVERY DAY 30 tablet 7  . Cholecalciferol (  VITAMIN D3) 1000 UNITS CAPS Take 1,000 Units by mouth daily.     . clonazePAM (KLONOPIN) 0.5 MG tablet Take 0.5 tablets (0.25 mg total) by mouth 2 (two) times daily as needed for anxiety. 20 tablet 1  . CRESTOR 20 MG tablet TAKE 1 TABLET BY MOUTH EVERY DAY 90 tablet 3  . diphenhydramine-acetaminophen (TYLENOL PM) 25-500 MG TABS Take 2 tablets by mouth at bedtime.     [name] FLUoxetine (PROZAC) 40 MG capsule Take 1 capsule (40 mg total) by mouth daily. 90 capsule 1  . fluticasone (FLONASE) 50 MCG/ACT nasal spray Place 2 sprays into both nostrils daily. 16 g 5  . furosemide (LASIX) 20 MG tablet TAKE ONE TABLET DAILY IF NEEDED FOR LEG SWELLING 30 tablet 1  . gabapentin (NEURONTIN) 300 MG capsule Take 300 mg by mouth 2 (two) times daily.    . hydrALAZINE (APRESOLINE) 25 MG tablet Take 2 tablets (50 mg total) by mouth 3 (three) times daily. (Patient taking differently: Take 50 mg by mouth 2 (two) times daily. ) 180 tablet 11  . hydrOXYzine (ATARAX/VISTARIL) 25 MG tablet TAKE 1/2 TO 1 TABLET AT BEDTIME AS NEEDED FOR ITCHING (Patient taking differently: Take 25 mg by mouth at bedtime. ) 30 tablet 1  . ibuprofen (ADVIL,MOTRIN) 200 MG tablet Take 800 mg by mouth daily as needed for moderate pain.    . Insulin Detemir (LEVEMIR FLEXTOUCH) 100 UNIT/ML Pen Inject 10 Units into the skin daily. (Patient taking differently: Inject 18 Units into the skin daily. ) 15 mL 3  . Insulin Pen Needle 31G X 5 MM MISC Use to inject insulin as instructed once a day 100 each 1  . meclizine (ANTIVERT) 12.5 MG tablet Take 12.5 mg by mouth 3  (three) times daily as needed for dizziness.    . meloxicam (MOBIC) 15 MG tablet Take 15 mg by mouth daily.    . metoprolol succinate (TOPROL-XL) 100 MG 24 hr tablet Take 1 tablet (100 mg total) by mouth daily. 90 tablet 3  . montelukast (SINGULAIR) 10 MG tablet TAKE 1 TABLET (10 MG TOTAL) BY MOUTH AT BEDTIME. 90 tablet 3  . nitroGLYCERIN (NITROSTAT) 0.4 MG SL tablet Place 0.4 mg under the tongue every 5 (five) minutes as needed for chest pain.     [name] omega-3 acid ethyl esters (LOVAZA) 1 g capsule Take 1 capsule (1 g total) by mouth 2 (two) times daily. 60 capsule 11  . repaglinide (PRANDIN) 2 MG tablet Take 1 tablet (2 mg total) by mouth 3 (three) times daily before meals. 90 tablet 11  . vitamin E 400 UNIT capsule Take 400 Units by mouth daily.    [name] losartan (COZAAR) 50 MG tablet Take 1 tablet (50 mg total) by mouth daily. 30 tablet 6  . pantoprazole (PROTONIX) 40 MG tablet Take 1 tablet (40 mg total) by mouth daily. 30 tablet 3   No current facility-administered medications on file prior to visit.     Review of Systems:  As per HPI- otherwise negative.  No fever, chills, nausea, vomiting, rash, CP or SOB   Physical Examination: Vitals:   09/04/16 1520  BP: 122/63  Pulse: 76  Temp: 98.2 F (36.8 C)   Vitals:   09/04/16 1520  Weight: 189 lb 6.4 oz (85.9 kg)  Height: 5\' 5"  (1.651 m)   Body mass index is 31.52 kg/m. Ideal Body Weight: Weight in (lb) to have BMI = 25: 149.9  GEN: WDWN, NAD, Non-toxic, A & O x  3, overweight, looks well HEENT: Atraumatic, Normocephalic. Neck supple. No masses, No LAD. Ears and Nose: No external deformity. CV: RRR, No M/G/R. No JVD. No thrill. No extra heart sounds. PULM: CTA B, no wheezes, crackles, rhonchi. No retractions. No resp. distress. No accessory muscle use. EXTR: No c/c/e NEURO Normal gait.  PSYCH: Normally interactive. Conversant. Not depressed or anxious appearing.  Calm demeanor.  Normal lumbar flexion and extension. No tenderness  to palpation of her spine.  Negative SLR. She does have tenderness over the left sciatic notch and spasm of the left buttock   Assessment and Plan: Uncontrolled type 2 diabetes mellitus with complication, with long-term current use of insulin ([place]) - Plan: Hemoglobin A1c  Lumbar pain with radiation down left leg - Plan: DG Lumbar Spine Complete, methocarbamol (ROBAXIN) 500 MG tablet  Leukocytosis, unspecified type - Plan: CBC  Here today to follow-up on her DM and also discuss a recurrent problem with her left lower back.  She has known lumbar abnl as per her MRI above Will have her try robaxin for muscle spasm pain in the left buttock Await her labs and x-ray report  Meds ordered this encounter  Medications  . methocarbamol (ROBAXIN) 500 MG tablet    Sig: Take 1 tablet (500 mg total) by mouth every 8 (eight) hours as needed for muscle spasms.    Dispense:  30 tablet    Refill:  0    Signed Lamar Blinks, MD  Received her labs and x-ray reports 2/23, gave her a call to discuss.  Her back is improved and is no longer really bothering her I am surprised to see her A1c so high.  She reports that her glucose has only been improved really over the last couple of weeks She has mild leukocytosis again Asked her to come back in 10 weeks to repeat A1c and CBC; she will let me know if any problems with hyperglycemia in the meantime  colonoscopy 2013  Results for orders placed or performed in visit on 09/04/16  CBC  Result Value Ref Range   WBC 12.1 (H) 4.0 - 10.5 K/uL   RBC 4.64 3.87 - 5.11 Mil/uL   Platelets 321.0 150.0 - 400.0 K/uL   Hemoglobin 11.9 (L) 12.0 - 15.0 g/dL   HCT 36.8 36.0 - 46.0 %   MCV 79.2 78.0 - 100.0 fl   MCHC 32.5 30.0 - 36.0 g/dL   RDW 14.2 11.5 - 15.5 %  Hemoglobin A1c  Result Value Ref Range   Hgb A1c MFr Bld 9.3 (H) 4.6 - 6.5 %    Dg Lumbar Spine Complete  Result Date: 09/04/2016 CLINICAL DATA:  Lumbar pain with left leg pain EXAM: LUMBAR SPINE -  COMPLETE 4+ VIEW COMPARISON:  Lumbar MRI 11/07/2014 FINDINGS: Slight levoscoliosis at L3-4. Normal alignment. No fracture or mass. Mild disc degeneration at L2-3, L3-4, and L4-5. Negative for pars defect. Mild atherosclerotic calcification in the aorta without aneurysm. IMPRESSION: Mild lumbar degenerative change. Electronically Signed   By: Franchot Gallo M.D.   On: 09/04/2016 17:09

## 2016-09-04 NOTE — Progress Notes (Signed)
Pre visit review using our clinic tool,if applicable. No additional management support is needed unless otherwise documented below in the visit note.  

## 2016-09-04 NOTE — Patient Instructions (Addendum)
We will repeat your a1c today and I hope that we will see improvement Great job on seeing the nutritionist- I am glad that you did this!   We are going to get x-rays of your lower back today.  Also, we will try a muscle relaxer- robaxin- as needed for your pain. However this medication can make you drowsy and should not be combined with any other sedating medication such as klonopin or hydroxyzine  I will be in touch with your labs and x-ray reports

## 2016-09-05 LAB — CBC
HCT: 36.8 % (ref 36.0–46.0)
Hemoglobin: 11.9 g/dL — ABNORMAL LOW (ref 12.0–15.0)
MCHC: 32.5 g/dL (ref 30.0–36.0)
MCV: 79.2 fl (ref 78.0–100.0)
Platelets: 321 10*3/uL (ref 150.0–400.0)
RBC: 4.64 Mil/uL (ref 3.87–5.11)
RDW: 14.2 % (ref 11.5–15.5)
WBC: 12.1 10*3/uL — ABNORMAL HIGH (ref 4.0–10.5)

## 2016-09-05 LAB — HEMOGLOBIN A1C: Hgb A1c MFr Bld: 9.3 % — ABNORMAL HIGH (ref 4.6–6.5)

## 2016-09-06 NOTE — Addendum Note (Signed)
Addended by: Lamar Blinks C on: 09/06/2016 10:06 AM   Modules accepted: Orders

## 2016-09-10 ENCOUNTER — Ambulatory Visit: Payer: Medicare Other | Admitting: Emergency Medicine

## 2016-09-11 LAB — FUNGAL ORGANISM REFLEX

## 2016-09-11 LAB — FUNGUS CULTURE WITH STAIN

## 2016-09-11 LAB — FUNGUS CULTURE RESULT

## 2016-09-12 ENCOUNTER — Other Ambulatory Visit: Payer: Self-pay | Admitting: Family Medicine

## 2016-09-12 DIAGNOSIS — R053 Chronic cough: Secondary | ICD-10-CM

## 2016-09-12 DIAGNOSIS — R05 Cough: Secondary | ICD-10-CM

## 2016-09-12 NOTE — Telephone Encounter (Signed)
Received refill request for benzonatate (TESSALON) 100 MG capsule. Last office visit 09/04/16 and last refill 08/13/16. Pt does have hx of chronic cough. Is it ok to refill? Please advise.

## 2016-09-24 ENCOUNTER — Ambulatory Visit: Payer: Medicare Other | Admitting: Registered"

## 2016-09-26 LAB — ACID FAST CULTURE WITH REFLEXED SENSITIVITIES (MYCOBACTERIA): Acid Fast Culture: NEGATIVE

## 2016-09-30 ENCOUNTER — Ambulatory Visit: Payer: Medicare Other | Admitting: Registered"

## 2016-09-30 NOTE — Progress Notes (Deleted)
Diabetes Self-Management Education  Visit Type:    Appt. Start Time: *** Appt. End Time: *** 09/30/2016  [name], (prefers to be called "Destiny Ballard") identified by name and date of birth, is a 66 y.o. female with a diagnosis of Diabetes:  .   ASSESSMENT Patient states that since retirement she doesn't have structure in her daily schedule and adjusting her eating and activities around her husband has been stressful. Informed patient that family members frequently come with patients to their visits and she could bring her husband with her to a follow-up appointment if she desires.   She would like to get off some of her medications through making life changes. She also states that she would like to lose some weight. She is not able to use CPAP due to her cough, but the wedge helps her sleep. She reports that she is not a smoker, but has been exposed to second-hand smoke all her life.   Individualized Plan for Diabetes Self-Management Training:   Learning Objective:  Patient will have a greater understanding of diabetes self-management. Patient education plan is to attend individual and/or group sessions per assessed needs and concerns.   There are no Patient Instructions on file for this visit.BRAT foods with protein to help with frequent bowel movements.  Return for follow-up visit to learn basics of glucose monitoring and A1c values, carb counting and label reading  Expected Outcomes:     Education material provided: My Plate, Snack sheet and Support group flyer, arm chair exercises, Counselor list in [place].  If problems or questions, patient to contact team via:  Phone and Email  Future DSME appointment:

## 2016-10-16 ENCOUNTER — Other Ambulatory Visit: Payer: Self-pay | Admitting: Nurse Practitioner

## 2016-10-16 ENCOUNTER — Other Ambulatory Visit: Payer: Self-pay | Admitting: Family Medicine

## 2016-10-16 DIAGNOSIS — M79605 Pain in left leg: Secondary | ICD-10-CM

## 2016-10-16 DIAGNOSIS — F4323 Adjustment disorder with mixed anxiety and depressed mood: Secondary | ICD-10-CM

## 2016-10-16 DIAGNOSIS — M545 Low back pain, unspecified: Secondary | ICD-10-CM

## 2016-10-16 NOTE — Telephone Encounter (Signed)
Last seen here in February [facility]: filled 20 klonpin on 07/21/16- rx from last July No other entries

## 2016-10-23 ENCOUNTER — Other Ambulatory Visit: Payer: Self-pay | Admitting: Family Medicine

## 2016-10-23 ENCOUNTER — Ambulatory Visit (INDEPENDENT_AMBULATORY_CARE_PROVIDER_SITE_OTHER): Payer: Medicare Other | Admitting: Family Medicine

## 2016-10-23 ENCOUNTER — Encounter: Payer: Self-pay | Admitting: Family Medicine

## 2016-10-23 VITALS — BP 162/80 | HR 107 | Temp 98.0°F | Ht 60.0 in | Wt 182.8 lb

## 2016-10-23 DIAGNOSIS — M545 Low back pain, unspecified: Secondary | ICD-10-CM

## 2016-10-23 DIAGNOSIS — E118 Type 2 diabetes mellitus with unspecified complications: Secondary | ICD-10-CM

## 2016-10-23 DIAGNOSIS — M25552 Pain in left hip: Secondary | ICD-10-CM

## 2016-10-23 DIAGNOSIS — D72829 Elevated white blood cell count, unspecified: Secondary | ICD-10-CM | POA: Diagnosis not present

## 2016-10-23 DIAGNOSIS — Z5181 Encounter for therapeutic drug level monitoring: Secondary | ICD-10-CM | POA: Diagnosis not present

## 2016-10-23 DIAGNOSIS — Z794 Long term (current) use of insulin: Secondary | ICD-10-CM

## 2016-10-23 DIAGNOSIS — E663 Overweight: Secondary | ICD-10-CM

## 2016-10-23 DIAGNOSIS — I10 Essential (primary) hypertension: Secondary | ICD-10-CM | POA: Diagnosis not present

## 2016-10-23 DIAGNOSIS — IMO0002 Reserved for concepts with insufficient information to code with codable children: Secondary | ICD-10-CM

## 2016-10-23 DIAGNOSIS — E1165 Type 2 diabetes mellitus with hyperglycemia: Secondary | ICD-10-CM

## 2016-10-23 DIAGNOSIS — M79605 Pain in left leg: Secondary | ICD-10-CM

## 2016-10-23 DIAGNOSIS — R600 Localized edema: Secondary | ICD-10-CM

## 2016-10-23 LAB — CBC
HCT: 40.5 % (ref 36.0–46.0)
Hemoglobin: 12.8 g/dL (ref 12.0–15.0)
MCHC: 31.7 g/dL (ref 30.0–36.0)
MCV: 79 fl (ref 78.0–100.0)
Platelets: 318 10*3/uL (ref 150.0–400.0)
RBC: 5.12 Mil/uL — ABNORMAL HIGH (ref 3.87–5.11)
RDW: 13.6 % (ref 11.5–15.5)
WBC: 11.2 10*3/uL — ABNORMAL HIGH (ref 4.0–10.5)

## 2016-10-23 LAB — BASIC METABOLIC PANEL
BUN: 20 mg/dL (ref 6–23)
CO2: 30 mEq/L (ref 19–32)
Calcium: 9.9 mg/dL (ref 8.4–10.5)
Chloride: 96 mEq/L (ref 96–112)
Creatinine, Ser: 0.65 mg/dL (ref 0.40–1.20)
GFR: 96.51 mL/min (ref >60.00)
Glucose, Bld: 199 mg/dL — ABNORMAL HIGH (ref 70–99)
Potassium: 4 mEq/L (ref 3.5–5.1)
Sodium: 136 mEq/L (ref 135–145)

## 2016-10-23 LAB — HEMOGLOBIN A1C: Hgb A1c MFr Bld: 9.6 % — ABNORMAL HIGH (ref 4.6–6.5)

## 2016-10-23 MED ORDER — METHOCARBAMOL 500 MG PO TABS: ORAL_TABLET | ORAL | 1 refills | Status: DC

## 2016-10-23 NOTE — Progress Notes (Signed)
Pre visit review using our clinic review tool, if applicable. No additional management support is needed unless otherwise documented below in the visit note. 

## 2016-10-23 NOTE — Patient Instructions (Signed)
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 Avoid painful activities as much as possible. Ice over area of pain 3-4 times a day for 15 minutes at a time Hip side raise exercise 3 sets of 10 once a day - add weights if this becomes too easy. Stretches - pick 2 and hold for 20-30 seconds x 3 - do once or twice a day. Tylenol and/or aleve as needed for pain (avoid aleve if you have problems with your stomach or kidneys). If not improving, can consider physical therapy and/or steroid injection. You also have spinal stenosis of your back accounting for the spasms you get in the back and numbness into your toe. Consider a flexion based program in physical therapy for this - let me know if you're interested in doing this in the future. Follow up with me as needed.

## 2016-10-23 NOTE — Progress Notes (Addendum)
[facility] [address], [place], [place] [address] [phone]  Date:  10/23/2016   Name:  Destiny Ballard   DOB:  [birth year]   MRN:  [id number]  PCP:  Lamar Blinks, MD    Chief Complaint: Hip Pain (c/o pain in left hip. pt has hard knot that has been present for about a month. )   History of Present Illness:  Destiny Ballard is a 66 y.o. very pleasant female patient who presents with the following:  Last seen by myself in February of this year- We checked an A1c at that time, it was higher than is typical for her  Lab Results  Component Value Date   HGBA1C 9.3 (H) 09/04/2016   Also in February she had complaint of pain in her left lower back as below:  She also notes pain in her lower back and down her left leg.  This will be worse after any exertion such as grocery shopping for the last couple of months.  She has noted some numbness in her left 4th and 5th toes as well since she first had onset of this pain No difficulty with bowel or bladder control  She did have a lumbar MRI back in 2016 IMPRESSION: 1. Mild multifactorial spinal stenosis at L4-5 with asymmetric narrowing of the left lateral recess and possible left L5 nerve root encroachment. 2. No other nerve root encroachment or significant spinal stenosis identified. 3. Tiny left paracentral disc protrusion at L3-4. 4. Mild disc bulging and loss of disc height at L2-3  We got plain films of her back on 2/21 CLINICAL DATA:  Lumbar pain with left leg pain  EXAM: LUMBAR SPINE - COMPLETE 4+ VIEW  COMPARISON:  Lumbar MRI 11/07/2014  FINDINGS: Slight levoscoliosis at L3-4. Normal alignment. No fracture or mass. Mild disc degeneration at L2-3, L3-4, and L4-5. Negative for pars defect.  Mild atherosclerotic calcification in the aorta without aneurysm.  IMPRESSION: Mild lumbar degenerative change.  We had her try robaxin. By the time I  called her to discuss her A1c her back was improved  However, she now notes a different problem in her left hip We need to repeat her A1c and CBC today  She has noted a "knot" over her left hip for about a month.  She still has some back spasms but they are not as bothersome The knot can be a bit tender to press on, and is located over the lateral left hip.  It feels firm and mobicle  Her blood sugar is "about 200" She is taking 20 units of her insulin and metformin still   She found the robaxin to be helpful to her for muscle spasms in her back and would like a RF of same  Patient Active Problem List   Diagnosis Date Noted  . Chronic rhinitis 08/07/2016  . Obstructive lung disease (generalized) ([place]) 04/25/2016  . Essential hypertension 01/30/2016  . High blood triglycerides   . Chest pain 01/28/2016  . Hypokalemia 01/27/2016  . Depression with anxiety   . CAD (coronary artery disease)   . Pain in the chest   . Hypertensive heart disease without CHF   . HLD (hyperlipidemia)   . Type 2 diabetes mellitus without complication, without long-term current use of insulin ([place])   . OSA (obstructive sleep apnea) 05/06/2014  . Dyspnea on exertion 09/02/2012  . Chest pain 12/10/2011  . Chronic cough 05/11/2010  .  Obesity (BMI 30-39.9)   . GERD 06/28/2008    Past Medical History:  Diagnosis Date  . Anxiety    Prior suicide attempt  . CAD (coronary artery disease)    a) s/p DES to LAD 07/2005 b) Last Myoview low risk 11/2011 showing small fixed apical perfusion defect (prior MI vs attenuation) but no ischemia - normal EF.  [name] Cervical spondylosis   . Coronary atherosclerosis 06/28/2008  . Depression   . Depression with anxiety   . Diabetes mellitus without complication ([place])   . GERD (gastroesophageal reflux disease)   . Hyperlipidemia   . Hypertension   . Insulin resistance   . Iron deficiency anemia   . Obesity     Past Surgical History:  Procedure Laterality Date  . BREAST  ENHANCEMENT SURGERY    . CARDIAC CATHETERIZATION  06/17/2007   NORMAL. EF 60%  . CARDIAC CATHETERIZATION N/A 01/29/2016   Procedure: Left Heart Cath and Coronary Angiography;  Surgeon: Sherren Mocha, MD;  Location: [facility];  Service: Cardiovascular;  Laterality: N/A;  . CERVICAL SPONDYLOSIS     SINGLE LEVEL FUSION  . CHILDBIRTH     X3  . CORONARY STENT PLACEMENT  07/2005   LEFT ANTERIOR DESCENDING  . FOREARM FRACTURE SURGERY  2010   hand and shoulder   . INCISION AND DRAINAGE BREAST ABSCESS  01/05/2012      . INCISION AND DRAINAGE PERIRECTAL ABSCESS N/A 02/18/2014   Procedure: IRRIGATION AND DEBRIDEMENT PERIRECTAL ABSCESS;  Surgeon: Pedro Earls, MD;  Location: [facility];  Service: General;  Laterality: N/A;  . LUMBAR LAMINECTOMY    . ROTATOR CUFF REPAIR     bil[place]  . TONSILLECTOMY AND ADENOIDECTOMY    . TUBAL LIGATION    . VIDEO BRONCHOSCOPY Bilateral 08/13/2016   Procedure: VIDEO BRONCHOSCOPY WITHOUT FLUORO;  Surgeon: Collene Gobble, MD;  Location: [facility];  Service: Cardiopulmonary;  Laterality: Bilateral;    Social History  Substance Use Topics  . Smoking status: Never Smoker  . Smokeless tobacco: Never Used  . Alcohol use Yes     Comment: occ    Family History  Problem Relation Age of Onset  . Heart attack Mother   . Diabetes Mother   . Lung cancer Mother   . Asthma Mother   . Heart disease Mother   . Suicidality Father     "killed himself"  . Asthma Daughter     x 2  . Cancer Daughter     pre-cancerous polyp  . Diabetes Sister   . Cancer Sister   . Cervical cancer Daughter     cervical   . Allergies Other     all family--seasonal allergies    Allergies  Allergen Reactions  . Prednisone Other (See Comments)    REACTION: mood swings, nightmares. "Shot doesn't bother me, reaction is just with the pill" she states she has had the steroid injections before. From our records methylprednisone was given to her in 2013 without any complications.   . Nitroglycerin     Severe headache that quickly fades away    Medication list has been reviewed and updated.  Current Outpatient Prescriptions on File Prior to Visit  Medication Sig Dispense Refill  . acetaminophen (TYLENOL) 650 MG CR tablet Take 1,300 mg by mouth every 8 (eight) hours as needed for pain.    [name] albuterol (PROAIR HFA) 108 (90 Base) MCG/ACT inhaler Inhale 2 puffs into the lungs every 4 (four) hours as needed for wheezing  or shortness of breath.    [name] amLODipine (NORVASC) 10 MG tablet TAKE 1 TABLET BY MOUTH EVERY DAY 90 tablet 1  . aspirin EC 81 MG tablet Take 81 mg by mouth daily.    . bromocriptine (PARLODEL) 2.5 MG tablet TAKE 1/2 TABLET (1.25 MG TOTAL) BY MOUTH AT BEDTIME. 45 tablet 1  . calcium carbonate (TUMS - DOSED IN MG ELEMENTAL CALCIUM) 500 MG chewable tablet Chew 2 tablets by mouth daily as needed for indigestion or heartburn.    . Cholecalciferol (VITAMIN D3) 1000 UNITS CAPS Take 1,000 Units by mouth daily.     . clonazePAM (KLONOPIN) 0.5 MG tablet TAKE 0.5 TABLETS (0.25 MG TOTAL) BY MOUTH 2 TIMES DAILY AS NEEDED FOR ANXIETY 20 tablet 1  . CRESTOR 20 MG tablet TAKE 1 TABLET BY MOUTH EVERY DAY 90 tablet 3  . diphenhydramine-acetaminophen (TYLENOL PM) 25-500 MG TABS Take 2 tablets by mouth at bedtime.     [name] FLUoxetine (PROZAC) 40 MG capsule Take 1 capsule (40 mg total) by mouth daily. 90 capsule 1  . fluticasone (FLONASE) 50 MCG/ACT nasal spray Place 2 sprays into both nostrils daily. 16 g 5  . gabapentin (NEURONTIN) 300 MG capsule Take 300 mg by mouth 2 (two) times daily.    . hydrALAZINE (APRESOLINE) 25 MG tablet Take 2 tablets (50 mg total) by mouth 3 (three) times daily. (Patient taking differently: Take 50 mg by mouth 2 (two) times daily. ) 180 tablet 11  . hydrOXYzine (ATARAX/VISTARIL) 25 MG tablet TAKE 1/2 TO 1 TABLET AT BEDTIME AS NEEDED FOR ITCHING (Patient taking differently: Take 25 mg by mouth at bedtime. ) 30 tablet 1  . ibuprofen (ADVIL,MOTRIN) 200 MG  tablet Take 800 mg by mouth daily as needed for moderate pain.    . Insulin Detemir (LEVEMIR FLEXTOUCH) 100 UNIT/ML Pen Inject 10 Units into the skin daily. (Patient taking differently: Inject 18 Units into the skin daily. ) 15 mL 3  . Insulin Pen Needle 31G X 5 MM MISC Use to inject insulin as instructed once a day 100 each 1  . losartan (COZAAR) 50 MG tablet TAKE 1 TABLET BY MOUTH EVERY DAY 90 tablet 0  . meclizine (ANTIVERT) 12.5 MG tablet Take 12.5 mg by mouth 3 (three) times daily as needed for dizziness.    . meloxicam (MOBIC) 15 MG tablet Take 15 mg by mouth daily.    . metFORMIN (GLUCOPHAGE-XR) 500 MG 24 hr tablet TAKE 2 TABLETS BY MOUTH EVERY DAY WITH BREAKFAST 180 tablet 0  . methocarbamol (ROBAXIN) 500 MG tablet TAKE 1 TABLET BY MOUTH EVERY 8 HOURS AS NEEDED FOR MUSCLE SPASMS 30 tablet 0  . metoprolol succinate (TOPROL-XL) 100 MG 24 hr tablet Take 1 tablet (100 mg total) by mouth daily. 90 tablet 3  . montelukast (SINGULAIR) 10 MG tablet TAKE 1 TABLET (10 MG TOTAL) BY MOUTH AT BEDTIME. 90 tablet 3  . nitroGLYCERIN (NITROSTAT) 0.4 MG SL tablet Place 0.4 mg under the tongue every 5 (five) minutes as needed for chest pain.     [name] omega-3 acid ethyl esters (LOVAZA) 1 g capsule Take 1 capsule (1 g total) by mouth 2 (two) times daily. 60 capsule 11  . repaglinide (PRANDIN) 2 MG tablet Take 1 tablet (2 mg total) by mouth 3 (three) times daily before meals. 90 tablet 11  . pantoprazole (PROTONIX) 40 MG tablet Take 1 tablet (40 mg total) by mouth daily. 30 tablet 3   No current facility-administered medications on file  prior to visit.     Review of Systems:  As per HPI- otherwise negative. BP Readings from Last 3 Encounters:  10/23/16 (!) 174/90  09/04/16 122/63  08/13/16 (!) 146/68      Physical Examination: Vitals:   10/23/16 1028 10/23/16 1035  BP: (!) 178/88 (!) 174/90  Pulse: (!) 105   Temp: 98 F (36.7 C)    Vitals:   10/23/16 1028  Weight: 182 lb 12.8 oz (82.9 kg)   Height: 5' (1.524 m)   Body mass index is 35.7 kg/m. Ideal Body Weight: Weight in (lb) to have BMI = 25: 127.7  GEN: WDWN, NAD, Non-toxic, A & O x 3, obese, otherwise looks well HEENT: Atraumatic, Normocephalic. Neck supple. No masses, No LAD. Ears and Nose: No external deformity. CV: RRR, No M/G/R. No JVD. No thrill. No extra heart sounds. PULM: CTA B, no wheezes, crackles, rhonchi. No retractions. No resp. distress. No accessory muscle use. ABD: S, NT, ND, +BS. No rebound. No HSM. EXTR: No c/c/e NEURO Normal gait.  PSYCH: Normally interactive. Conversant. Not depressed or anxious appearing.  Calm demeanor.  She has a firm, grape sized mobile mass over her left lateral hip- ? In the IT band.  Normal ROM of the hip.  She has some pain with abduction of the flexed hip but otherwise no pain with ROM  She notes that the muscles in her right thoracolumbar area are sometimes tight and tender, but no bony spine pain    Assessment and Plan: Left hip pain - Plan: Ambulatory referral to Sports Medicine  Uncontrolled type 2 diabetes mellitus with complication, with long-term current use of insulin ([place]) - Plan: Hemoglobin A1c  Leukocytosis, unspecified type - Plan: CBC  Essential hypertension, benign  Overweight  Lumbar pain with radiation down left leg - Plan: methocarbamol (ROBAXIN) 500 MG tablet  Medication monitoring encounter - Plan: Basic metabolic panel  Here today with a couple of concerns Referral to sports med for further eval of left hip issue- ?[place] might be helpful Her BP is higher today than usual- will continue to monitor Await labs and will be in touch with her Refilled her robaxin today also   Signed Lamar Blinks, MD  Received her labs and gave her a call  4/17.  Her A1c is much too high.  She is taking 20 units of levemir once a day.  Plan to gradually increase this dose- she will check her fasting glucose and as long as over 150 she will increase by 2 units  every 2 days She will let me know if at 30 units and still not at goal Plan to recheck here in 2 months   Results for orders placed or performed in visit on 10/23/16  CBC  Result Value Ref Range   WBC 11.2 (H) 4.0 - 10.5 K/uL   RBC 5.12 (H) 3.87 - 5.11 Mil/uL   Platelets 318.0 150.0 - 400.0 K/uL   Hemoglobin 12.8 12.0 - 15.0 g/dL   HCT 40.5 36.0 - 46.0 %   MCV 79.0 78.0 - 100.0 fl   MCHC 31.7 30.0 - 36.0 g/dL   RDW 13.6 11.5 - 15.5 %  Hemoglobin A1c  Result Value Ref Range   Hgb A1c MFr Bld 9.6 (H) 4.6 - 6.5 %  Basic metabolic panel  Result Value Ref Range   Sodium 136 135 - 145 mEq/L   Potassium 4.0 3.5 - 5.1 mEq/L   Chloride 96 96 - 112 mEq/L   CO2  30 19 - 32 mEq/L   Glucose, Bld 199 (H) 70 - 99 mg/dL   BUN 20 6 - 23 mg/dL   Creatinine, Ser 0.65 0.40 - 1.20 mg/dL   Calcium 9.9 8.4 - 10.5 mg/dL   GFR 96.51 >60.00 mL/min

## 2016-10-23 NOTE — Patient Instructions (Addendum)
It was very nice to see you today!  I will be in touch with your labs asap We are also going to refer you to see sports medicine to help [place] with your left hip pain Continue to use the robaxin (muscle relaxer) as needed for pain in your back.    We will continue to monitor your blood pressure

## 2016-10-28 DIAGNOSIS — M25552 Pain in left hip: Secondary | ICD-10-CM | POA: Insufficient documentation

## 2016-10-28 NOTE — Assessment & Plan Note (Signed)
2/2 IT band syndrome and trochanteric bursitis.  Icing, shown home exercises and stretches to do daily.  Tylenol and/or aleve if needed.  Consider physical therapy for this and her known spinal stenosis (flexion based program) if not improving.  F/u prn.

## 2016-10-28 NOTE — Progress Notes (Signed)
PCP and consultation requested by: Lamar Blinks, MD  Subjective:   HPI: Patient is a 66 y.o. female here for left hip pain.  Patient reports she's had about 1 month of pain, noticed a knot on lateral aspect of left hip. Notices spasms when walking. Pain is 0/10 at rest, up to 10/10 and sharp Radiates down into thigh. Tried muscle relaxant. No skin changes. Gets numbness in little toe only on this side. No back pain, bowel/bladder dysfunction. No skin changes.  Past Medical History:  Diagnosis Date  . Anxiety    Prior suicide attempt  . CAD (coronary artery disease)    a) s/p DES to LAD 07/2005 b) Last Myoview low risk 11/2011 showing small fixed apical perfusion defect (prior MI vs attenuation) but no ischemia - normal EF.  [name] Cervical spondylosis   . Coronary atherosclerosis 06/28/2008  . Depression   . Depression with anxiety   . Diabetes mellitus without complication ([place])   . GERD (gastroesophageal reflux disease)   . Hyperlipidemia   . Hypertension   . Insulin resistance   . Iron deficiency anemia   . Obesity     Current Outpatient Prescriptions on File Prior to Visit  Medication Sig Dispense Refill  . acetaminophen (TYLENOL) 650 MG CR tablet Take 1,300 mg by mouth every 8 (eight) hours as needed for pain.    [name] albuterol (PROAIR HFA) 108 (90 Base) MCG/ACT inhaler Inhale 2 puffs into the lungs every 4 (four) hours as needed for wheezing or shortness of breath.    [name] amLODipine (NORVASC) 10 MG tablet TAKE 1 TABLET BY MOUTH EVERY DAY 90 tablet 1  . aspirin EC 81 MG tablet Take 81 mg by mouth daily.    . bromocriptine (PARLODEL) 2.5 MG tablet TAKE 1/2 TABLET (1.25 MG TOTAL) BY MOUTH AT BEDTIME. 45 tablet 1  . calcium carbonate (TUMS - DOSED IN MG ELEMENTAL CALCIUM) 500 MG chewable tablet Chew 2 tablets by mouth daily as needed for indigestion or heartburn.    . Cholecalciferol (VITAMIN D3) 1000 UNITS CAPS Take 1,000 Units by mouth daily.     . clonazePAM (KLONOPIN) 0.5 MG  tablet TAKE 0.5 TABLETS (0.25 MG TOTAL) BY MOUTH 2 TIMES DAILY AS NEEDED FOR ANXIETY 20 tablet 1  . CRESTOR 20 MG tablet TAKE 1 TABLET BY MOUTH EVERY DAY 90 tablet 3  . diphenhydramine-acetaminophen (TYLENOL PM) 25-500 MG TABS Take 2 tablets by mouth at bedtime.     [name] FLUoxetine (PROZAC) 40 MG capsule Take 1 capsule (40 mg total) by mouth daily. 90 capsule 1  . fluticasone (FLONASE) 50 MCG/ACT nasal spray Place 2 sprays into both nostrils daily. 16 g 5  . furosemide (LASIX) 20 MG tablet TAKE ONE TABLET DAILY IF NEEDED FOR LEG SWELLING 30 tablet 1  . gabapentin (NEURONTIN) 300 MG capsule Take 300 mg by mouth 2 (two) times daily.    . hydrALAZINE (APRESOLINE) 25 MG tablet Take 2 tablets (50 mg total) by mouth 3 (three) times daily. (Patient taking differently: Take 50 mg by mouth 2 (two) times daily. ) 180 tablet 11  . hydrOXYzine (ATARAX/VISTARIL) 25 MG tablet TAKE 1/2 TO 1 TABLET AT BEDTIME AS NEEDED FOR ITCHING (Patient taking differently: Take 25 mg by mouth at bedtime. ) 30 tablet 1  . ibuprofen (ADVIL,MOTRIN) 200 MG tablet Take 800 mg by mouth daily as needed for moderate pain.    . Insulin Detemir (LEVEMIR FLEXTOUCH) 100 UNIT/ML Pen Inject 10 Units into the skin daily. (Patient taking  differently: Inject 18 Units into the skin daily. ) 15 mL 3  . Insulin Pen Needle 31G X 5 MM MISC Use to inject insulin as instructed once a day 100 each 1  . losartan (COZAAR) 50 MG tablet TAKE 1 TABLET BY MOUTH EVERY DAY 90 tablet 0  . meclizine (ANTIVERT) 12.5 MG tablet Take 12.5 mg by mouth 3 (three) times daily as needed for dizziness.    . meloxicam (MOBIC) 15 MG tablet Take 15 mg by mouth daily.    . metFORMIN (GLUCOPHAGE-XR) 500 MG 24 hr tablet TAKE 2 TABLETS BY MOUTH EVERY DAY WITH BREAKFAST 180 tablet 0  . methocarbamol (ROBAXIN) 500 MG tablet TAKE 1 TABLET BY MOUTH EVERY 8 HOURS AS NEEDED FOR MUSCLE SPASMS 60 tablet 1  . metoprolol succinate (TOPROL-XL) 100 MG 24 hr tablet Take 1 tablet (100 mg total)  by mouth daily. 90 tablet 3  . montelukast (SINGULAIR) 10 MG tablet TAKE 1 TABLET (10 MG TOTAL) BY MOUTH AT BEDTIME. 90 tablet 3  . nitroGLYCERIN (NITROSTAT) 0.4 MG SL tablet Place 0.4 mg under the tongue every 5 (five) minutes as needed for chest pain.     [name] omega-3 acid ethyl esters (LOVAZA) 1 g capsule Take 1 capsule (1 g total) by mouth 2 (two) times daily. 60 capsule 11  . pantoprazole (PROTONIX) 40 MG tablet Take 1 tablet (40 mg total) by mouth daily. 30 tablet 3  . repaglinide (PRANDIN) 2 MG tablet Take 1 tablet (2 mg total) by mouth 3 (three) times daily before meals. 90 tablet 11   No current facility-administered medications on file prior to visit.     Past Surgical History:  Procedure Laterality Date  . BREAST ENHANCEMENT SURGERY    . CARDIAC CATHETERIZATION  06/17/2007   NORMAL. EF 60%  . CARDIAC CATHETERIZATION N/A 01/29/2016   Procedure: Left Heart Cath and Coronary Angiography;  Surgeon: Sherren Mocha, MD;  Location: [facility];  Service: Cardiovascular;  Laterality: N/A;  . CERVICAL SPONDYLOSIS     SINGLE LEVEL FUSION  . CHILDBIRTH     X3  . CORONARY STENT PLACEMENT  07/2005   LEFT ANTERIOR DESCENDING  . FOREARM FRACTURE SURGERY  2010   hand and shoulder   . INCISION AND DRAINAGE BREAST ABSCESS  01/05/2012      . INCISION AND DRAINAGE PERIRECTAL ABSCESS N/A 02/18/2014   Procedure: IRRIGATION AND DEBRIDEMENT PERIRECTAL ABSCESS;  Surgeon: Pedro Earls, MD;  Location: [facility];  Service: General;  Laterality: N/A;  . LUMBAR LAMINECTOMY    . ROTATOR CUFF REPAIR     bilaterla  . TONSILLECTOMY AND ADENOIDECTOMY    . TUBAL LIGATION    . VIDEO BRONCHOSCOPY Bilateral 08/13/2016   Procedure: VIDEO BRONCHOSCOPY WITHOUT FLUORO;  Surgeon: Collene Gobble, MD;  Location: [facility];  Service: Cardiopulmonary;  Laterality: Bilateral;    Allergies  Allergen Reactions  . Prednisone Other (See Comments)    REACTION: mood swings, nightmares. "Shot doesn't bother me,  reaction is just with the pill" she states she has had the steroid injections before. From our records methylprednisone was given to her in 2013 without any complications.  . Nitroglycerin     Severe headache that quickly fades away    Social History   Social History  . Marital status: Married    Spouse name: [place] Bells  . Number of children: 3  . Years of education: 12   Occupational History  . retired from [employer]  11/2012   Social History Main Topics  . Smoking status: Never Smoker  . Smokeless tobacco: Never Used  . Alcohol use Yes     Comment: occ  . Drug use: No  . Sexual activity: Yes    Partners: Male    Birth control/ protection: None   Other Topics Concern  . Not on file   Social History Narrative   Lives with her husband.  Their eldest daughter lives upstairs.    Family History  Problem Relation Age of Onset  . Heart attack Mother   . Diabetes Mother   . Lung cancer Mother   . Asthma Mother   . Heart disease Mother   . Suicidality Father     "killed himself"  . Asthma Daughter     x 2  . Cancer Daughter     pre-cancerous polyp  . Diabetes Sister   . Cancer Sister   . Cervical cancer Daughter     cervical   . Allergies Other     all family--seasonal allergies    BP (!) 160/98   Pulse (!) 108   Ht 5' (1.524 m)   Wt 182 lb (82.6 kg)   BMI 35.54 kg/m   Review of Systems: See HPI above.     Objective:  Physical Exam:  Gen: NAD, comfortable in exam room  Back/left hip: No gross deformity, scoliosis. TTP left greater trochanter, proximal IT band.  No midline or bony TTP.  No other tenderness. FROM with negative logroll Strength 4/5 hip abduction, 5/5 other muscle groups.   2+ MSRs in patellar and achilles tendons, equal bilaterally. Negative SLRs. Sensation diminished 5th digit - intact otherwise. Negative fabers and piriformis stretches.   Assessment & Plan:  1. Left hip pain - 2/2 IT band syndrome and trochanteric bursitis.  Icing,  shown home exercises and stretches to do daily.  Tylenol and/or aleve if needed.  Consider physical therapy for this and her known spinal stenosis (flexion based program) if not improving.  F/u prn.

## 2016-10-29 ENCOUNTER — Encounter: Payer: Self-pay | Admitting: Family Medicine

## 2016-11-26 ENCOUNTER — Other Ambulatory Visit: Payer: Self-pay | Admitting: Family Medicine

## 2016-11-26 DIAGNOSIS — R05 Cough: Secondary | ICD-10-CM

## 2016-11-26 DIAGNOSIS — R053 Chronic cough: Secondary | ICD-10-CM

## 2016-11-27 ENCOUNTER — Telehealth: Payer: Self-pay | Admitting: Emergency Medicine

## 2016-11-27 MED ORDER — BENZONATATE 100 MG PO CAPS: 100.0000 mg | ORAL_CAPSULE | Freq: Three times a day (TID) | ORAL | 0 refills | Status: DC | PRN

## 2016-11-27 NOTE — Telephone Encounter (Signed)
Received refill request for BENZONATATE 100 MG CAPSULE. Last office visit 10/23/16 and last refill on 07/22/16. Is it ok to refill? Please advise.

## 2016-11-27 NOTE — Addendum Note (Signed)
Addended by: Lamar Blinks C on: 11/27/2016 09:05 AM   Modules accepted: Orders

## 2016-12-01 ENCOUNTER — Other Ambulatory Visit: Payer: Self-pay | Admitting: Endocrinology

## 2016-12-01 NOTE — Telephone Encounter (Signed)
Please refill x 3 mos Ov is due 

## 2017-01-12 ENCOUNTER — Other Ambulatory Visit: Payer: Self-pay | Admitting: Nurse Practitioner

## 2017-02-11 ENCOUNTER — Other Ambulatory Visit: Payer: Self-pay | Admitting: Family Medicine

## 2017-02-12 ENCOUNTER — Other Ambulatory Visit: Payer: Self-pay | Admitting: Emergency Medicine

## 2017-02-12 ENCOUNTER — Other Ambulatory Visit: Payer: Self-pay | Admitting: Family Medicine

## 2017-02-12 MED ORDER — BENZONATATE 100 MG PO CAPS: 100.0000 mg | ORAL_CAPSULE | Freq: Three times a day (TID) | ORAL | 0 refills | Status: DC | PRN

## 2017-02-12 NOTE — Telephone Encounter (Signed)
Requesting: benzonatate (TESSALON) 100 MG capsule Last OV: 10/23/16 Last Refill: 11/27/16  Please Advise

## 2017-02-17 ENCOUNTER — Telehealth: Payer: Self-pay | Admitting: Family Medicine

## 2017-02-17 NOTE — Telephone Encounter (Signed)
Pt would like to have a Rx for a new diabetic machine. Pt says that she's not the sure of the name but she said that they pt would place on her arm to get a diabetic reading instead of pricking her finger. Pt said that she spoke with the pharmacy and was advise to get Rx from PCP  CB: [place] [address]

## 2017-02-19 ENCOUNTER — Other Ambulatory Visit: Payer: Self-pay | Admitting: Emergency Medicine

## 2017-02-20 MED ORDER — FREESTYLE LIBRE READER DEVI: 1.0000 | Freq: Two times a day (BID) | 0 refills | Status: DC

## 2017-02-20 MED ORDER — FREESTYLE LIBRE SENSOR SYSTEM MISC: 3.0000 | Freq: Two times a day (BID) | 3 refills | Status: DC

## 2017-02-20 NOTE — Telephone Encounter (Signed)
[facility]- I am not how to get this type of device covered by her insurance/ unsure what she is asking for.  pharmD advised me this is the [facility] device which offers continuous glucose monitoring- she needs one reader device and will change the sensor every 10 days.  Will rx this for her Lab Results  Component Value Date   HGBA1C 9.6 (H) 10/23/2016

## 2017-03-10 ENCOUNTER — Other Ambulatory Visit: Payer: Self-pay | Admitting: Endocrinology

## 2017-03-13 ENCOUNTER — Other Ambulatory Visit: Payer: Self-pay | Admitting: Family Medicine

## 2017-03-13 NOTE — Telephone Encounter (Signed)
Denied/pt needs OV/thx dmf

## 2017-03-28 ENCOUNTER — Telehealth: Payer: Self-pay | Admitting: Family Medicine

## 2017-03-28 DIAGNOSIS — M545 Low back pain, unspecified: Secondary | ICD-10-CM

## 2017-03-28 DIAGNOSIS — M79605 Pain in left leg: Secondary | ICD-10-CM

## 2017-03-28 MED ORDER — METHOCARBAMOL 750 MG PO TABS: ORAL_TABLET | ORAL | 0 refills | Status: DC

## 2017-03-28 NOTE — Telephone Encounter (Signed)
[facility] [phone]   Called in because they received a Rx for methocarbamol. She said that it is on back order and would like for provider to advise further.

## 2017-04-02 NOTE — Telephone Encounter (Signed)
Called pharmacy- this was already taken care of.  No other issues today

## 2017-04-03 ENCOUNTER — Ambulatory Visit (INDEPENDENT_AMBULATORY_CARE_PROVIDER_SITE_OTHER): Payer: Medicare Other | Admitting: Family Medicine

## 2017-04-03 VITALS — BP 118/55 | HR 90 | Temp 98.3°F | Ht 60.0 in | Wt 183.4 lb

## 2017-04-03 DIAGNOSIS — E663 Overweight: Secondary | ICD-10-CM | POA: Diagnosis not present

## 2017-04-03 DIAGNOSIS — R053 Chronic cough: Secondary | ICD-10-CM

## 2017-04-03 DIAGNOSIS — Z23 Encounter for immunization: Secondary | ICD-10-CM | POA: Diagnosis not present

## 2017-04-03 DIAGNOSIS — IMO0002 Reserved for concepts with insufficient information to code with codable children: Secondary | ICD-10-CM

## 2017-04-03 DIAGNOSIS — E118 Type 2 diabetes mellitus with unspecified complications: Secondary | ICD-10-CM | POA: Diagnosis not present

## 2017-04-03 DIAGNOSIS — R05 Cough: Secondary | ICD-10-CM

## 2017-04-03 DIAGNOSIS — Z794 Long term (current) use of insulin: Secondary | ICD-10-CM | POA: Diagnosis not present

## 2017-04-03 DIAGNOSIS — D72829 Elevated white blood cell count, unspecified: Secondary | ICD-10-CM | POA: Diagnosis not present

## 2017-04-03 DIAGNOSIS — E1165 Type 2 diabetes mellitus with hyperglycemia: Secondary | ICD-10-CM

## 2017-04-03 DIAGNOSIS — L299 Pruritus, unspecified: Secondary | ICD-10-CM

## 2017-04-03 DIAGNOSIS — E785 Hyperlipidemia, unspecified: Secondary | ICD-10-CM

## 2017-04-03 DIAGNOSIS — I10 Essential (primary) hypertension: Secondary | ICD-10-CM

## 2017-04-03 LAB — LIPID PANEL
Cholesterol: 239 mg/dL — ABNORMAL HIGH (ref 0–200)
HDL: 52.2 mg/dL (ref >39.00)
NonHDL: 186.49
Total CHOL/HDL Ratio: 5
Triglycerides: 389 mg/dL — ABNORMAL HIGH (ref 0.0–149.0)
VLDL: 77.8 mg/dL — ABNORMAL HIGH (ref 0.0–40.0)

## 2017-04-03 LAB — COMPREHENSIVE METABOLIC PANEL
ALT: 19 U/L (ref 0–35)
AST: 13 U/L (ref 0–37)
Albumin: 4 g/dL (ref 3.5–5.2)
Alkaline Phosphatase: 94 U/L (ref 39–117)
BUN: 14 mg/dL (ref 6–23)
CO2: 32 mEq/L (ref 19–32)
Calcium: 10 mg/dL (ref 8.4–10.5)
Chloride: 95 mEq/L — ABNORMAL LOW (ref 96–112)
Creatinine, Ser: 0.79 mg/dL (ref 0.40–1.20)
GFR: 76.96 mL/min (ref >60.00)
Glucose, Bld: 184 mg/dL — ABNORMAL HIGH (ref 70–99)
Potassium: 3.5 mEq/L (ref 3.5–5.1)
Sodium: 137 mEq/L (ref 135–145)
Total Bilirubin: 0.4 mg/dL (ref 0.2–1.2)
Total Protein: 7.9 g/dL (ref 6.0–8.3)

## 2017-04-03 LAB — CBC
HCT: 38.4 % (ref 36.0–46.0)
Hemoglobin: 12.5 g/dL (ref 12.0–15.0)
MCHC: 32.7 g/dL (ref 30.0–36.0)
MCV: 78.8 fl (ref 78.0–100.0)
Platelets: 335 10*3/uL (ref 150.0–400.0)
RBC: 4.87 Mil/uL (ref 3.87–5.11)
RDW: 14.9 % (ref 11.5–15.5)
WBC: 10 10*3/uL (ref 4.0–10.5)

## 2017-04-03 LAB — HEMOGLOBIN A1C: Hgb A1c MFr Bld: 9.9 % — ABNORMAL HIGH (ref 4.6–6.5)

## 2017-04-03 LAB — LDL CHOLESTEROL, DIRECT: Direct LDL: 100 mg/dL

## 2017-04-03 MED ORDER — HYDROXYZINE HCL 25 MG PO TABS: 25.0000 mg | ORAL_TABLET | Freq: Every day | ORAL | 3 refills | Status: DC

## 2017-04-03 MED ORDER — BENZONATATE 100 MG PO CAPS: 100.0000 mg | ORAL_CAPSULE | Freq: Three times a day (TID) | ORAL | 3 refills | Status: DC | PRN

## 2017-04-03 NOTE — Patient Instructions (Signed)
It was good to see you today- take care and I will be in touch with your labs asap If you are having a hard time breathing, I would recommend that you follow-up with Dr. Lamonte Sakai for your lungs-

## 2017-04-03 NOTE — Progress Notes (Addendum)
[place] [facility] [address] [place], [place] [address] [phone]  Date:  04/03/2017   Name:  Destiny Ballard   DOB:  27-Apr-1951   MRN:  [id number]  PCP:  Darreld Mclean, MD    Chief Complaint: Follow-up (Pt here for follow up. Flu vaccine today. Would like a refill on Benzonatate (TESSALON) and hYDRoxyZINE. )   History of Present Illness:  Destiny Ballard is a 66 y.o. very pleasant female patient who presents with the following:  Here today for a recheck visit I last saw her in April of this year history of DM, CAD, depression, HTN, dyslipidemia Her DM has not been under control- at her last visit I asked her to increase her levemir gradually and to come back in 2 weeks to recheck and also to repeat her WBC count- however she did not come back until today   Lab Results  Component Value Date   HGBA1C 9.6 (H) 10/23/2016   Eye exam due- will do today Foot exam due- she did have cataract surgery last November.  She will see her eye doctor soon for a routine eye check Flu shot due- will give today Mammogram due- reminded pt but she is not sure that she wants to have this done, "I don't think I need those" She is UTD on tdap, prevnar and pneumovax   She reports that her blood sugar is running around 250 She is taking 30 units of levemir, and also metformin - 2 tablets daily, as well as Prandin She is fasting this am for labs - she did take her medications only  She notes that she is getting some indigestion with her lovaza  She also notes that she has "trouble with my back," when she twists "I will feel my bones, I don't know the words for it."    She does see Dr. Arnoldo Morale for this  She is using tessalon for cough prn- needs refill today She also takes hydroxyzine at bedtime for itching- needs refill  She has seen pulmonology in the past, it looks like she was dx with ?COPD.    She has albuterol and singulair on  her med list  BP Readings from Last 3 Encounters:  04/03/17 (!) 118/55  10/23/16 (!) 160/98  10/23/16 (!) 162/80     Patient Active Problem List   Diagnosis Date Noted  . Left hip pain 10/28/2016  . Chronic rhinitis 08/07/2016  . Obstructive lung disease (generalized) ([place]) 04/25/2016  . Essential hypertension 01/30/2016  . High blood triglycerides   . Chest pain 01/28/2016  . Hypokalemia 01/27/2016  . Depression with anxiety   . CAD (coronary artery disease)   . Pain in the chest   . Hypertensive heart disease without CHF   . HLD (hyperlipidemia)   . Type 2 diabetes mellitus without complication, without long-term current use of insulin ([place])   . OSA (obstructive sleep apnea) 05/06/2014  . Dyspnea on exertion 09/02/2012  . Chest pain 12/10/2011  . Chronic cough 05/11/2010  . Obesity (BMI 30-39.9)   . GERD 06/28/2008    Past Medical History:  Diagnosis Date  . Anxiety    Prior suicide attempt  . CAD (coronary artery disease)    a) s/p DES to LAD 07/2005 b) Last Myoview low risk 11/2011 showing small fixed apical perfusion defect (prior MI vs attenuation) but no ischemia - normal EF.  [name] Cervical spondylosis   .  Coronary atherosclerosis 06/28/2008  . Depression   . Depression with anxiety   . Diabetes mellitus without complication ([place])   . GERD (gastroesophageal reflux disease)   . Hyperlipidemia   . Hypertension   . Insulin resistance   . Iron deficiency anemia   . Obesity     Past Surgical History:  Procedure Laterality Date  . BREAST ENHANCEMENT SURGERY    . CARDIAC CATHETERIZATION  06/17/2007   NORMAL. EF 60%  . CARDIAC CATHETERIZATION N/A 01/29/2016   Procedure: Left Heart Cath and Coronary Angiography;  Surgeon: Sherren Mocha, MD;  Location: [facility];  Service: Cardiovascular;  Laterality: N/A;  . CERVICAL SPONDYLOSIS     SINGLE LEVEL FUSION  . CHILDBIRTH     X3  . CORONARY STENT PLACEMENT  07/2005   LEFT ANTERIOR DESCENDING  . FOREARM  FRACTURE SURGERY  2010   hand and shoulder   . INCISION AND DRAINAGE BREAST ABSCESS  01/05/2012      . INCISION AND DRAINAGE PERIRECTAL ABSCESS N/A 02/18/2014   Procedure: IRRIGATION AND DEBRIDEMENT PERIRECTAL ABSCESS;  Surgeon: Pedro Earls, MD;  [facility]: [facility];  Service: General;  Laterality: N/A;  . LUMBAR LAMINECTOMY    . ROTATOR CUFF REPAIR     bilaterla  . TONSILLECTOMY AND ADENOIDECTOMY    . TUBAL LIGATION    . VIDEO BRONCHOSCOPY Bilateral 08/13/2016   Procedure: VIDEO BRONCHOSCOPY WITHOUT FLUORO;  Surgeon: Collene Gobble, MD;  [facility];  Service: Cardiopulmonary;  Laterality: Bilateral;    Social History  Substance Use Topics  . Smoking status: Never Smoker  . Smokeless tobacco: Never Used  . Alcohol use Yes     Comment: occ    Family History  Problem Relation Age of Onset  . Heart attack Mother   . Diabetes Mother   . Lung cancer Mother   . Asthma Mother   . Heart disease Mother   . Suicidality Father        "killed himself"  . Asthma Daughter        x 2  . Cancer Daughter        pre-cancerous polyp  . Diabetes Sister   . Cancer Sister   . Cervical cancer Daughter        cervical   . Allergies Other        all family--seasonal allergies    Allergies  Allergen Reactions  . Prednisone Other (See Comments)    REACTION: mood swings, nightmares. "Shot doesn't bother me, reaction is just with the pill" she states she has had the steroid injections before. From our records methylprednisone was given to her in 2013 without any complications.  . Nitroglycerin     Severe headache that quickly fades away    Medication list has been reviewed and updated.  Current Outpatient Prescriptions on File Prior to Visit  Medication Sig Dispense Refill  . acetaminophen (TYLENOL) 650 MG CR tablet Take 1,300 mg by mouth every 8 (eight) hours as needed for pain.    [name] albuterol (PROAIR HFA) 108 (90 Base) MCG/ACT inhaler Inhale 2 puffs into the lungs every 4  (four) hours as needed for wheezing or shortness of breath.    [name] amLODipine (NORVASC) 10 MG tablet TAKE 1 TABLET BY MOUTH EVERY DAY 90 tablet 1  . aspirin EC 81 MG tablet Take 81 mg by mouth daily.    . bromocriptine (PARLODEL) 2.5 MG tablet TAKE 1/2 TABLET (1.25 MG TOTAL) BY MOUTH AT BEDTIME.  45 tablet 1  . calcium carbonate (TUMS - DOSED IN MG ELEMENTAL CALCIUM) 500 MG chewable tablet Chew 2 tablets by mouth daily as needed for indigestion or heartburn.    . cetirizine (ZYRTEC) 10 MG tablet TAKE 1 TABLET BY MOUTH DAILY AS NEEDED FOR ALLERGIES  3  . Cholecalciferol (VITAMIN D3) 1000 UNITS CAPS Take 1,000 Units by mouth daily.     . clonazePAM (KLONOPIN) 0.5 MG tablet TAKE 0.5 TABLETS (0.25 MG TOTAL) BY MOUTH 2 TIMES DAILY AS NEEDED FOR ANXIETY 20 tablet 1  . Continuous Blood Gluc Receiver (FREESTYLE LIBRE READER) DEVI 1 Device by Does not apply route 2 (two) times daily. 1 Device 0  . Continuous Blood Gluc Sensor (FREESTYLE LIBRE SENSOR SYSTEM) MISC 3 each by Does not apply route 2 (two) times daily. 3 each 3  . CRESTOR 20 MG tablet TAKE 1 TABLET BY MOUTH EVERY DAY 90 tablet 3  . diphenhydramine-acetaminophen (TYLENOL PM) 25-500 MG TABS Take 2 tablets by mouth at bedtime.     [name] FLUoxetine (PROZAC) 40 MG capsule Take 1 capsule (40 mg total) by mouth daily. 90 capsule 1  . fluticasone (FLONASE) 50 MCG/ACT nasal spray Place 2 sprays into both nostrils daily. 16 g 5  . furosemide (LASIX) 20 MG tablet TAKE ONE TABLET DAILY IF NEEDED FOR LEG SWELLING 30 tablet 1  . gabapentin (NEURONTIN) 300 MG capsule Take 300 mg by mouth 2 (two) times daily.    . hydrALAZINE (APRESOLINE) 25 MG tablet Take 2 tablets (50 mg total) by mouth 3 (three) times daily. (Patient taking differently: Take 50 mg by mouth 2 (two) times daily. ) 180 tablet 11  . hydrOXYzine (ATARAX/VISTARIL) 25 MG tablet TAKE 1/2 TO 1 TABLET AT BEDTIME AS NEEDED FOR ITCHING (Patient taking differently: Take 25 mg by mouth at bedtime. ) 30 tablet 1   . ibuprofen (ADVIL,MOTRIN) 200 MG tablet Take 800 mg by mouth daily as needed for moderate pain.    . Insulin Detemir (LEVEMIR FLEXTOUCH) 100 UNIT/ML Pen Inject 10 Units into the skin daily. (Patient taking differently: Inject 18 Units into the skin daily. ) 15 mL 3  . Insulin Pen Needle 31G X 5 MM MISC Use to inject insulin as instructed once a day 100 each 1  . losartan (COZAAR) 50 MG tablet TAKE 1 TABLET BY MOUTH EVERY DAY 90 tablet 0  . meclizine (ANTIVERT) 12.5 MG tablet Take 12.5 mg by mouth 3 (three) times daily as needed for dizziness.    . meloxicam (MOBIC) 15 MG tablet Take 15 mg by mouth daily.    . metFORMIN (GLUCOPHAGE-XR) 500 MG 24 hr tablet TAKE 2 TABLETS BY MOUTH EVERY DAY WITH BREAKFAST *NEED APPOINTMENT FOR REFILLS* 180 tablet 0  . methocarbamol (ROBAXIN) 750 MG tablet TAKE 1 TABLET BY MOUTH EVERY 12 HOURS AS NEEDED FOR MUSCLE SPASMS 60 tablet 0  . metoprolol succinate (TOPROL-XL) 100 MG 24 hr tablet Take 1 tablet (100 mg total) by mouth daily. 90 tablet 3  . montelukast (SINGULAIR) 10 MG tablet TAKE 1 TABLET (10 MG TOTAL) BY MOUTH AT BEDTIME. 90 tablet 3  . nitroGLYCERIN (NITROSTAT) 0.4 MG SL tablet Place 0.4 mg under the tongue every 5 (five) minutes as needed for chest pain.     [name] omega-3 acid ethyl esters (LOVAZA) 1 g capsule Take 1 capsule (1 g total) by mouth 2 (two) times daily. 60 capsule 11  . repaglinide (PRANDIN) 2 MG tablet Take 1 tablet (2 mg total) by mouth  3 (three) times daily before meals. 90 tablet 11  . pantoprazole (PROTONIX) 40 MG tablet Take 1 tablet (40 mg total) by mouth daily. 30 tablet 3   No current facility-administered medications on file prior to visit.     Review of Systems:  As per HPI- otherwise negative.   Physical Examination: Vitals:   04/03/17 1120  BP: (!) 118/55  Pulse: (!) 106  Temp: 98.3 F (36.8 C)  SpO2: 94%   Vitals:   04/03/17 1120  Weight: 183 lb 6.4 oz (83.2 kg)  Height: 5' (1.524 m)   Body mass index is 35.82  kg/m. Ideal Body Weight: Weight in (lb) to have BMI = 25: 127.7  GEN: WDWN, NAD, Non-toxic, A & O x 3, overweight, looks well, here today with her husband who is now in a WC HEENT: Atraumatic, Normocephalic. Neck supple. No masses, No LAD. Ears and Nose: No external deformity. CV: RRR, No M/G/R. No JVD. No thrill. No extra heart sounds. PULM: CTA B, no wheezes, crackles, rhonchi. No retractions. No resp. distress. No accessory muscle use. ABD: S, NT, ND, +BS. No rebound. No HSM. EXTR: No c/c/e NEURO Normal gait.  PSYCH: Normally interactive. Conversant. Not depressed or anxious appearing.  Calm demeanor.   Foot exam: she is not able to perceive a couple of tested sites   She is not following up with Dr. [place] Carbo for her lungs either  Assessment and Plan: Uncontrolled type 2 diabetes mellitus with complication, with long-term current use of insulin ([place]) - Plan: Comprehensive metabolic panel, Hemoglobin A1c  Essential hypertension, benign  Overweight  Leukocytosis, unspecified type - Plan: CBC  Dyslipidemia - Plan: Lipid panel  Immunization due - Plan: Flu vaccine HIGH DOSE PF (Fluzone High dose)  Persistent cough - Plan: benzonatate (TESSALON) 100 MG capsule  Itching - Plan: hydrOXYzine (ATARAX/VISTARIL) 25 MG tablet  Here today for a recheck visit History of uncontrolled diabetes.  She unfortunately did not come back for a recheck after her last visit Will check her A1c today to find out where we are and then adjust medications as needed Flu shot given Foot exam done Repeat her CBC and lipid panel today  [name] and her husband, Betsey Amen, are living on their own although one daughter does live with them.  This daughter "has her own problems" and is not really helpful to them.  I am worried about how they are managing at home, as they have not been attending follow-up visits. They both smell strongly of ?cat urine- when asked they state they have 2 dogs but no cats in the home.    Suggested that they think about moving to an assisted living- they are not really open to this idea as of yet   Will plan further follow- up pending labs.   Signed Lamar Blinks, MD  Gave her a call on 9/25 with her labs. Left detailed message unfortunately the same day as our visit [name] fell and broke his hip- let her know that I am so sorry about his accident.  Would like to discuss her labs with her- would like to consider adding another medication to her DM regimen (invokana 100).  Will try her back   10/5 Called her back- [name] just came home today, they do not have home health coming because they are concerned that one of their dogs might bite them.  They are seeing me on Monday so I can see how Betsey Amen is doing Discussed her elevated A1c.  Want  to keep things as simple as we can for now.  Will increase her metformin to 1000 BID and will add a statin for her cholesterol   Results for orders placed or performed in visit on 04/03/17  Comprehensive metabolic panel  Result Value Ref Range   Sodium 137 135 - 145 mEq/L   Potassium 3.5 3.5 - 5.1 mEq/L   Chloride 95 (L) 96 - 112 mEq/L   CO2 32 19 - 32 mEq/L   Glucose, Bld 184 (H) 70 - 99 mg/dL   BUN 14 6 - 23 mg/dL   Creatinine, Ser 0.79 0.40 - 1.20 mg/dL   Total Bilirubin 0.4 0.2 - 1.2 mg/dL   Alkaline Phosphatase 94 39 - 117 U/L   AST 13 0 - 37 U/L   ALT 19 0 - 35 U/L   Total Protein 7.9 6.0 - 8.3 g/dL   Albumin 4.0 3.5 - 5.2 g/dL   Calcium 10.0 8.4 - 10.5 mg/dL   GFR 76.96 >60.00 mL/min  CBC  Result Value Ref Range   WBC 10.0 4.0 - 10.5 K/uL   RBC 4.87 3.87 - 5.11 Mil/uL   Platelets 335.0 150.0 - 400.0 K/uL   Hemoglobin 12.5 12.0 - 15.0 g/dL   HCT 38.4 36.0 - 46.0 %   MCV 78.8 78.0 - 100.0 fl   MCHC 32.7 30.0 - 36.0 g/dL   RDW 14.9 11.5 - 15.5 %  Hemoglobin A1c  Result Value Ref Range   Hgb A1c MFr Bld 9.9 (H) 4.6 - 6.5 %  Lipid panel  Result Value Ref Range   Cholesterol 239 (H) 0 - 200 mg/dL   Triglycerides 389.0  (H) 0.0 - 149.0 mg/dL   HDL 52.20 >39.00 mg/dL   VLDL 77.8 (H) 0.0 - 40.0 mg/dL   Total CHOL/HDL Ratio 5    NonHDL 186.49   LDL cholesterol, direct  Result Value Ref Range   Direct LDL 100.0 mg/dL

## 2017-04-18 ENCOUNTER — Encounter: Payer: Self-pay | Admitting: Family Medicine

## 2017-04-18 MED ORDER — LOVASTATIN 20 MG PO TABS: 20.0000 mg | ORAL_TABLET | Freq: Every day | ORAL | 3 refills | Status: DC

## 2017-04-18 MED ORDER — METFORMIN HCL ER 500 MG PO TB24: 1000.0000 mg | ORAL_TABLET | Freq: Two times a day (BID) | ORAL | 0 refills | Status: DC

## 2017-04-18 NOTE — Addendum Note (Signed)
Addended by: Lamar Blinks C on: 04/18/2017 03:10 PM   Modules accepted: Orders

## 2017-05-17 ENCOUNTER — Other Ambulatory Visit: Payer: Self-pay | Admitting: Nurse Practitioner

## 2017-05-23 ENCOUNTER — Telehealth: Payer: Self-pay | Admitting: Family Medicine

## 2017-05-23 NOTE — Telephone Encounter (Signed)
Called patient to schedule AWV. Left vm for pt to call office to schedule appt.

## 2017-06-07 ENCOUNTER — Other Ambulatory Visit: Payer: Self-pay | Admitting: Endocrinology

## 2017-06-07 NOTE — Telephone Encounter (Signed)
Please refill x 1 Ov is due  

## 2017-06-09 ENCOUNTER — Other Ambulatory Visit: Payer: Self-pay

## 2017-06-09 DIAGNOSIS — E1165 Type 2 diabetes mellitus with hyperglycemia: Secondary | ICD-10-CM

## 2017-06-09 DIAGNOSIS — IMO0002 Reserved for concepts with insufficient information to code with codable children: Secondary | ICD-10-CM

## 2017-06-09 DIAGNOSIS — Z794 Long term (current) use of insulin: Principal | ICD-10-CM

## 2017-06-09 DIAGNOSIS — E118 Type 2 diabetes mellitus with unspecified complications: Secondary | ICD-10-CM

## 2017-06-09 MED ORDER — METFORMIN HCL ER 500 MG PO TB24: 1000.0000 mg | ORAL_TABLET | Freq: Two times a day (BID) | ORAL | 0 refills | Status: DC

## 2017-06-16 ENCOUNTER — Other Ambulatory Visit: Payer: Self-pay | Admitting: Cardiology

## 2017-07-24 ENCOUNTER — Other Ambulatory Visit: Payer: Self-pay | Admitting: Cardiology

## 2017-08-06 ENCOUNTER — Other Ambulatory Visit: Payer: Self-pay | Admitting: Nurse Practitioner

## 2017-08-11 ENCOUNTER — Other Ambulatory Visit: Payer: Self-pay | Admitting: Emergency Medicine

## 2017-08-16 ENCOUNTER — Other Ambulatory Visit: Payer: Self-pay | Admitting: Cardiology

## 2017-08-22 ENCOUNTER — Other Ambulatory Visit: Payer: Self-pay | Admitting: Family Medicine

## 2017-08-22 DIAGNOSIS — R600 Localized edema: Secondary | ICD-10-CM

## 2017-08-26 DIAGNOSIS — E119 Type 2 diabetes mellitus without complications: Secondary | ICD-10-CM | POA: Diagnosis not present

## 2017-08-26 LAB — HM DIABETES EYE EXAM

## 2017-08-27 NOTE — Progress Notes (Signed)
Subjective:   Destiny Ballard is a 67 y.o. female who presents for an Initial Medicare Annual Wellness Visit. The Patient was informed that the wellness visit is to identify future health risk and educate and initiate measures that can reduce risk for increased disease through the lifespan.   Describes health as fair, good or great? Pt states " At this point, I don't care." Pt is very tearful. Just lost her husband in Dec 2018.  Review of Systems    No ROS.  Medicare Wellness Visit. Additional risk factors are reflected in the social history. Cardiac Risk Factors include: advanced age (>1men, >29 women);diabetes mellitus;dyslipidemia;hypertension;obesity (BMI >30kg/m2);sedentary lifestyle Sleep patterns: varies per pt Home Safety/Smoke Alarms: Feels safe in home. Smoke alarms in place.  Living environment; residence and Firearm Safety: Lives in 2 story home. Rarely goes upstairs. Lives with daughter. 4 little dogs. Seat Belt Safety/Bike Helmet: Wears seat belt.   Female:   Pap- [facility]. 2 yrs ago per pt      Mammo- declines at this time.      Dexa scan-  Declines at this time.      CCS- last 07/27/08.     Objective:    Today's Vitals   09/01/17 1522  BP: (!) 167/82  Pulse: 90  SpO2: 95%  Weight: 181 lb 12.8 oz (82.5 kg)  Height: 5' (1.524 m)   Body mass index is 35.51 kg/m.  Advanced Directives 09/01/2017 08/27/2016 08/13/2016 03/07/2016 02/20/2016 01/26/2016 12/07/2015  Does Patient Have a Medical Advance Directive? Yes Yes Yes Yes Yes Yes Yes  Type of Paramedic of [place];Living will - Living will Living will Living will Living will Living will  Does patient want to make changes to medical advance directive? No - Patient declined - - No - Patient declined No - Patient declined - -  Copy of [place] in Chart? No - copy requested - - No - copy requested Yes - -  Would patient like information on creating a medical advance  directive? - - - - - - -  Pre-existing out of facility DNR order (yellow form or pink MOST form) - - - - - - -    Current Medications (verified) Outpatient Encounter Medications as of 09/01/2017  Medication Sig  . acetaminophen (TYLENOL) 650 MG CR tablet Take 1,300 mg by mouth every 8 (eight) hours as needed for pain.  [name] albuterol (PROAIR HFA) 108 (90 Base) MCG/ACT inhaler Inhale 2 puffs into the lungs every 4 (four) hours as needed for wheezing or shortness of breath.  [name] amLODipine (NORVASC) 10 MG tablet TAKE 1 TABLET BY MOUTH EVERY DAY  . aspirin EC 81 MG tablet Take 81 mg by mouth daily.  . benzonatate (TESSALON) 100 MG capsule Take 1 capsule (100 mg total) by mouth 3 (three) times daily as needed for cough.  . bromocriptine (PARLODEL) 2.5 MG tablet TAKE 1/2 TABLET (1.25 MG TOTAL) BY MOUTH AT BEDTIME.  . calcium carbonate (TUMS - DOSED IN MG ELEMENTAL CALCIUM) 500 MG chewable tablet Chew 2 tablets by mouth daily as needed for indigestion or heartburn.  . cetirizine (ZYRTEC) 10 MG tablet TAKE 1 TABLET BY MOUTH DAILY AS NEEDED FOR ALLERGIES  . Cholecalciferol (VITAMIN D3) 1000 UNITS CAPS Take 1,000 Units by mouth daily.   . clonazePAM (KLONOPIN) 0.5 MG tablet TAKE 0.5 TABLETS (0.25 MG TOTAL) BY MOUTH 2 TIMES DAILY AS NEEDED FOR ANXIETY  . Continuous Blood Gluc Receiver (FREESTYLE [place]  READER) DEVI 1 Device by Does not apply route 2 (two) times daily.  . CRESTOR 20 MG tablet TAKE 1 TABLET BY MOUTH EVERY DAY  . diphenhydramine-acetaminophen (TYLENOL PM) 25-500 MG TABS Take 2 tablets by mouth at bedtime.   [name] FLUoxetine (PROZAC) 40 MG capsule Take 1 capsule (40 mg total) by mouth daily.  . fluticasone (FLONASE) 50 MCG/ACT nasal spray Place 2 sprays into both nostrils daily.  . furosemide (LASIX) 20 MG tablet TAKE ONE TABLET DAILY IF NEEDED FOR LEG SWELLING  . gabapentin (NEURONTIN) 300 MG capsule Take 300 mg by mouth 2 (two) times daily.  . hydrALAZINE (APRESOLINE) 25 MG tablet Take 2 tablets  (50 mg total) by mouth 3 (three) times daily. (Patient taking differently: Take 50 mg by mouth 2 (two) times daily. )  . hydrOXYzine (ATARAX/VISTARIL) 25 MG tablet Take 1 tablet (25 mg total) by mouth at bedtime.  [name] ibuprofen (ADVIL,MOTRIN) 200 MG tablet Take 800 mg by mouth daily as needed for moderate pain.  . Insulin Detemir (LEVEMIR FLEXTOUCH) 100 UNIT/ML Pen Inject 10 Units into the skin daily. (Patient taking differently: Inject 18 Units into the skin daily. )  . Insulin Pen Needle 31G X 5 MM MISC Use to inject insulin as instructed once a day  . losartan (COZAAR) 50 MG tablet TAKE 1 TABLET BY MOUTH EVERY DAY *NEED APPOINTMENT FOR REFILLS*  . lovastatin (MEVACOR) 20 MG tablet Take 1 tablet (20 mg total) by mouth at bedtime.  . meclizine (ANTIVERT) 12.5 MG tablet Take 12.5 mg by mouth 3 (three) times daily as needed for dizziness.  . meloxicam (MOBIC) 15 MG tablet Take 15 mg by mouth daily.  . metFORMIN (GLUCOPHAGE-XR) 500 MG 24 hr tablet Take 2 tablets (1,000 mg total) by mouth 2 (two) times daily.  . methocarbamol (ROBAXIN) 750 MG tablet TAKE 1 TABLET BY MOUTH EVERY 12 HOURS AS NEEDED FOR MUSCLE SPASMS  . metoprolol succinate (TOPROL-XL) 100 MG 24 hr tablet Take 1 tablet (100 mg total) by mouth daily. Please make an appt for more refills, thank you.  . montelukast (SINGULAIR) 10 MG tablet TAKE 1 TABLET (10 MG TOTAL) BY MOUTH AT BEDTIME.  . nitroGLYCERIN (NITROSTAT) 0.4 MG SL tablet Place 0.4 mg under the tongue every 5 (five) minutes as needed for chest pain.   [name] omega-3 acid ethyl esters (LOVAZA) 1 g capsule Take 1 capsule (1 g total) by mouth 2 (two) times daily.  . repaglinide (PRANDIN) 2 MG tablet Take 1 tablet (2 mg total) by mouth 3 (three) times daily before meals.  . Continuous Blood Gluc Sensor (FREESTYLE LIBRE SENSOR SYSTEM) MISC 3 each by Does not apply route 2 (two) times daily. (Patient not taking: Reported on 09/01/2017)  . pantoprazole (PROTONIX) 40 MG tablet Take 1 tablet (40  mg total) by mouth daily.   No facility-administered encounter medications on file as of 09/01/2017.     Allergies (verified) Prednisone   History: Past Medical History:  Diagnosis Date  . Anxiety    Prior suicide attempt  . CAD (coronary artery disease)    a) s/p DES to LAD 07/2005 b) Last Myoview low risk 11/2011 showing small fixed apical perfusion defect (prior MI vs attenuation) but no ischemia - normal EF.  [name] Cervical spondylosis   . Coronary atherosclerosis 06/28/2008  . Depression   . Depression with anxiety   . Diabetes mellitus without complication ([place])   . GERD (gastroesophageal reflux disease)   . Hyperlipidemia   . Hypertension   .  Insulin resistance   . Iron deficiency anemia   . Obesity    Past Surgical History:  Procedure Laterality Date  . BREAST ENHANCEMENT SURGERY    . CARDIAC CATHETERIZATION  06/17/2007   NORMAL. EF 60%  . CARDIAC CATHETERIZATION N/A 01/29/2016   Procedure: Left Heart Cath and Coronary Angiography;  Surgeon: Sherren Mocha, MD;  Location: [facility];  Service: Cardiovascular;  Laterality: N/A;  . CERVICAL SPONDYLOSIS     SINGLE LEVEL FUSION  . CHILDBIRTH     X3  . CORONARY STENT PLACEMENT  07/2005   LEFT ANTERIOR DESCENDING  . FOREARM FRACTURE SURGERY  2010   hand and shoulder   . INCISION AND DRAINAGE BREAST ABSCESS  01/05/2012      . INCISION AND DRAINAGE PERIRECTAL ABSCESS N/A 02/18/2014   Procedure: IRRIGATION AND DEBRIDEMENT PERIRECTAL ABSCESS;  Surgeon: Pedro Earls, MD;  Location: [facility];  Service: General;  Laterality: N/A;  . LUMBAR LAMINECTOMY    . ROTATOR CUFF REPAIR     bilaterla  . TONSILLECTOMY AND ADENOIDECTOMY    . TUBAL LIGATION    . VIDEO BRONCHOSCOPY Bilateral 08/13/2016   Procedure: VIDEO BRONCHOSCOPY WITHOUT FLUORO;  Surgeon: Collene Gobble, MD;  Location: [facility];  Service: Cardiopulmonary;  Laterality: Bilateral;   Family History  Problem Relation Age of Onset  . Heart attack Mother   .  Diabetes Mother   . Lung cancer Mother   . Asthma Mother   . Heart disease Mother   . Suicidality Father        "killed himself"  . Asthma Daughter        x 2  . Cancer Daughter        pre-cancerous polyp  . Diabetes Sister   . Cancer Sister   . Cervical cancer Daughter        cervical   . Allergies Other        all family--seasonal allergies   Social History   Socioeconomic History  . Marital status: Married    Spouse name: Lake Bells  . Number of children: 3  . Years of education: 74  . Highest education level: None  Social Needs  . Financial resource strain: None  . Food insecurity - worry: None  . Food insecurity - inability: None  . Transportation needs - medical: None  . Transportation needs - non-medical: None  Occupational History  . Occupation: retired from [employer]: 11/2012  Tobacco Use  . Smoking status: Never Smoker  . Smokeless tobacco: Never Used  Substance and Sexual Activity  . Alcohol use: Yes    Comment: occ  . Drug use: No  . Sexual activity: Not Currently    Partners: Male    Birth control/protection: None  Other Topics Concern  . None  Social History Narrative   Lives with her husband.  Their eldest daughter lives upstairs.    Tobacco Counseling Counseling given: Not Answered   Clinical Intake:     Pain : No/denies pain     Activities of Daily Living In your present state of health, do you have any difficulty performing the following activities: 09/01/2017 09/01/2017  Hearing? - N  Vision? N N  Comment Eye exam last week w/ DR.Gould hx cataract sx  Difficulty concentrating or making decisions? - N  Walking or climbing stairs? - N  Dressing or bathing? - N  Doing errands, shopping? - N  Conservation officer, nature and eating ? - N  Using the Toilet? - N  In the past six months, have you accidently leaked urine? - Y  Do you have problems with loss of bowel control? - N  Managing your Medications? - N  Managing your Finances? - N    Housekeeping or managing your Housekeeping? - N  Some recent data might be hidden     Immunizations and Health Maintenance Immunization History  Administered Date(s) Administered  . Influenza Whole 04/14/2010  . Influenza, High Dose Seasonal PF 04/03/2017  . Influenza,inj,Quad PF,6+ Mos 05/10/2013, 04/05/2014, 05/22/2015, 04/25/2016  . Pneumococcal Conjugate-13 07/24/2016  . Pneumococcal Polysaccharide-23 04/05/2014, 05/22/2015  . Tdap 07/11/2014   Health Maintenance Due  Topic Date Due  . OPHTHALMOLOGY EXAM  08/15/2016  . FOOT EXAM  10/16/2016  . MAMMOGRAM  04/17/2017    Patient Care Team: Copland, Gay Filler, MD as PCP - General (Family Medicine) Brien Few, MD as Consulting Physician (Obstetrics and Gynecology)  Indicate any recent Medical Services you may have received from other than [facility] providers in the past year (date may be approximate).     Assessment:   This is a routine wellness examination for [place]. Physical assessment deferred to PCP.  Hearing/Vision screen Hearing Screening Comments: Able to hear conversational tones w/o difficulty. No issues reported.   Vision Screening Comments: Wears OTC reading glasses. Eye exam last week with Dr.Gould per pt  Dietary issues and exercise activities discussed: Current Exercise Habits: The patient does not participate in regular exercise at present, Exercise limited by: None identified Diet (meal preparation, eat out, water intake, caffeinated beverages, dairy products, fruits and vegetables): in general, an "unhealthy" diet   Goals    . remain mentally and emotionally stable after loss of husband      Depression Screen PHQ 2/9 Scores 09/01/2017 08/27/2016 09/20/2015  PHQ - 2 Score 6 3 0  PHQ- 9 Score 21 15 -  Exception Documentation - - Patient refusal    Fall Risk Fall Risk  09/01/2017 09/01/2017 08/27/2016 09/20/2015  Falls in the past year? No No No No    Cognitive Function: MMSE - Mini Mental State Exam  09/01/2017  Orientation to time 5  Orientation to Place 5  Registration 3  Attention/ Calculation 5  Recall 3  Language- name 2 objects 2  Language- repeat 1  Language- follow 3 step command 3  Language- read & follow direction 1  Write a sentence 1  Copy design 1  Total score 30        Screening Tests Health Maintenance  Topic Date Due  . OPHTHALMOLOGY EXAM  08/15/2016  . FOOT EXAM  10/16/2016  . MAMMOGRAM  04/17/2017  . HEMOGLOBIN A1C  10/01/2017  . COLONOSCOPY  10/30/2021  . TETANUS/TDAP  07/11/2024  . INFLUENZA VACCINE  Completed  . DEXA SCAN  Completed  . Hepatitis C Screening  Completed  . PNA vac Low Risk Adult  Completed     Plan:   Follow up with Dr.Copland today as scheduled  Continue to eat heart healthy diet (full of fruits, vegetables, whole grains, lean protein, water--limit salt, fat, and sugar intake) and increase physical activity as tolerated.  Continue doing brain stimulating activities (puzzles, reading, adult coloring books, staying active) to keep memory sharp.   Bring a copy of your living will and/or healthcare power of attorney to your next office visit.   I have personally reviewed and noted the following in the patient's chart:   . Medical and social history . Use  of alcohol, tobacco or illicit drugs  . Current medications and supplements . Functional ability and status . Nutritional status . Physical activity . Advanced directives . List of other physicians . Hospitalizations, surgeries, and ER visits in previous 12 months . Vitals . Screenings to include cognitive, depression, and falls . Referrals and appointments  In addition, I have reviewed and discussed with patient certain preventive protocols, quality metrics, and best practice recommendations. A written personalized care plan for preventive services as well as general preventive health recommendations were provided to patient.     Naaman Plummer [place], [place]   09/01/2017

## 2017-08-31 NOTE — Progress Notes (Addendum)
[facility] [address], [place], [place] [address] [phone]  Date:  09/01/2017   Name:  Destiny Ballard   DOB:  1951/06/23   MRN:  [id number]  PCP:  Darreld Mclean, MD    Chief Complaint: Medicare Wellness   History of Present Illness:  Destiny Ballard is a 67 y.o. very pleasant female patient who presents with the following:  Following up today Last seen by myself in September- however in the interim her husband Destiny Ballard passed away at the end of 09-Aug-2023  From our visit in September Here today for a recheck visit History of uncontrolled diabetes.  She unfortunately did not come back for a recheck after her last visit Will check her A1c today to find out where we are and then adjust medications as needed Flu shot given Foot exam done Repeat her CBC and lipid panel today  [name] and her husband, Destiny Ballard, are living on their own although one daughter does live with them.  This daughter "has her own problems" and is not really helpful to them.  I am worried about how they are managing at home, as they have not been attending follow-up visits. They both smell strongly of ?cat urine- when asked they state they have 2 dogs but no cats in the home.   I suggested that they think about moving to an assisted living- they are not really open to this idea as of yet   Lab Results  Component Value Date   HGBA1C 9.9 (H) 04/03/2017   Needs foot exam and A1c, BMP today  She is dealing with the recent loss of her husband as best as she is able.  She is really sad and upset about his loss She is not really taking care of herself as well as she should be right now but does not have any thoughts of self harm Her children are all local - one lives with her- and they are supportive and helpful Financially she feels that she will be ok, she has enough income to support herself  Lab Results  Component Value Date   HGBA1C 9.9  (H) 04/03/2017   She was up to about 30 units of levemir- has not used in the last couple of months however She is usually taking her other medications but not always  BP Readings from Last 3 Encounters:  09/01/17 (!) 152/72  04/03/17 (!) 118/55  10/23/16 (!) 160/98   Her sleep is "off and on"  Noted a heart murmur on exam today- I had not noted this in the past and pt states she was not told she had a murmur previously.   Asked about any CP- she admits that over the last few months she has noted some intermittent periods of chest discomfort when she gets "more stressed out." may last 5 minutes, she is not sure how often they are happen. Last occurred a week ago. No current CP The sx are not exertional- more likely to occur at rest   She did have a cath in 01/2016 which showed mild disease-   1. Continued patency of the LAD stent 2. Mild nonobstructive CAD  3. Normal LV function by echo assessment  Suspect noncardiac chest pain  Patient Active Problem List   Diagnosis Date Noted  . Left hip pain 10/28/2016  . Chronic rhinitis 08/07/2016  . Obstructive lung disease (generalized) ([place]) 04/25/2016  . Essential hypertension 01/30/2016  .  High blood triglycerides   . Hypokalemia 01/27/2016  . Depression with anxiety   . CAD (coronary artery disease)   . HLD (hyperlipidemia)   . Type 2 diabetes mellitus without complication, without long-term current use of insulin ([place])   . OSA (obstructive sleep apnea) 05/06/2014  . Dyspnea on exertion 09/02/2012  . Chest pain 12/10/2011  . Chronic cough 05/11/2010  . Obesity (BMI 30-39.9)   . GERD 06/28/2008    Past Medical History:  Diagnosis Date  . Anxiety    Prior suicide attempt  . CAD (coronary artery disease)    a) s/p DES to LAD 07/2005 b) Last Myoview low risk 11/2011 showing small fixed apical perfusion defect (prior MI vs attenuation) but no ischemia - normal EF.  [name] Cervical spondylosis   . Coronary atherosclerosis 06/28/2008   . Depression   . Depression with anxiety   . Diabetes mellitus without complication ([place])   . GERD (gastroesophageal reflux disease)   . Hyperlipidemia   . Hypertension   . Insulin resistance   . Iron deficiency anemia   . Obesity     Past Surgical History:  Procedure Laterality Date  . BREAST ENHANCEMENT SURGERY    . CARDIAC CATHETERIZATION  06/17/2007   NORMAL. EF 60%  . CARDIAC CATHETERIZATION N/A 01/29/2016   Procedure: Left Heart Cath and Coronary Angiography;  Surgeon: Sherren Mocha, MD;  Location: [facility];  Service: Cardiovascular;  Laterality: N/A;  . CERVICAL SPONDYLOSIS     SINGLE LEVEL FUSION  . CHILDBIRTH     X3  . CORONARY STENT PLACEMENT  07/2005   LEFT ANTERIOR DESCENDING  . FOREARM FRACTURE SURGERY  2010   hand and shoulder   . INCISION AND DRAINAGE BREAST ABSCESS  01/05/2012      . INCISION AND DRAINAGE PERIRECTAL ABSCESS N/A 02/18/2014   Procedure: IRRIGATION AND DEBRIDEMENT PERIRECTAL ABSCESS;  Surgeon: Pedro Earls, MD;  Location: [facility];  Service: General;  Laterality: N/A;  . LUMBAR LAMINECTOMY    . ROTATOR CUFF REPAIR     bil[place]la  . TONSILLECTOMY AND ADENOIDECTOMY    . TUBAL LIGATION    . VIDEO BRONCHOSCOPY Bilateral 08/13/2016   Procedure: VIDEO BRONCHOSCOPY WITHOUT FLUORO;  Surgeon: Collene Gobble, MD;  Location: [facility];  Service: Cardiopulmonary;  Laterality: Bilateral;    Social History   Tobacco Use  . Smoking status: Never Smoker  . Smokeless tobacco: Never Used  Substance Use Topics  . Alcohol use: Yes    Comment: occ  . Drug use: No    Family History  Problem Relation Age of Onset  . Heart attack Mother   . Diabetes Mother   . Lung cancer Mother   . Asthma Mother   . Heart disease Mother   . Suicidality Father        "killed himself"  . Asthma Daughter        x 2  . Cancer Daughter        pre-cancerous polyp  . Diabetes Sister   . Cancer Sister   . Cervical cancer Daughter        cervical   .  Allergies Other        all family--seasonal allergies    Allergies  Allergen Reactions  . Prednisone Other (See Comments)    REACTION: mood swings, nightmares. "Shot doesn't bother me, reaction is just with the pill" she states she has had the steroid injections before. From our records methylprednisone was given to  her in 2013 without any complications.    Medication list has been reviewed and updated.  Current Outpatient Medications on File Prior to Visit  Medication Sig Dispense Refill  . acetaminophen (TYLENOL) 650 MG CR tablet Take 1,300 mg by mouth every 8 (eight) hours as needed for pain.    [name] albuterol (PROAIR HFA) 108 (90 Base) MCG/ACT inhaler Inhale 2 puffs into the lungs every 4 (four) hours as needed for wheezing or shortness of breath.    [name] amLODipine (NORVASC) 10 MG tablet TAKE 1 TABLET BY MOUTH EVERY DAY 90 tablet 1  . aspirin EC 81 MG tablet Take 81 mg by mouth daily.    . benzonatate (TESSALON) 100 MG capsule Take 1 capsule (100 mg total) by mouth 3 (three) times daily as needed for cough. 40 capsule 3  . bromocriptine (PARLODEL) 2.5 MG tablet TAKE 1/2 TABLET (1.25 MG TOTAL) BY MOUTH AT BEDTIME. 45 tablet 1  . calcium carbonate (TUMS - DOSED IN MG ELEMENTAL CALCIUM) 500 MG chewable tablet Chew 2 tablets by mouth daily as needed for indigestion or heartburn.    . cetirizine (ZYRTEC) 10 MG tablet TAKE 1 TABLET BY MOUTH DAILY AS NEEDED FOR ALLERGIES  3  . Cholecalciferol (VITAMIN D3) 1000 UNITS CAPS Take 1,000 Units by mouth daily.     . clonazePAM (KLONOPIN) 0.5 MG tablet TAKE 0.5 TABLETS (0.25 MG TOTAL) BY MOUTH 2 TIMES DAILY AS NEEDED FOR ANXIETY 20 tablet 1  . Continuous Blood Gluc Receiver (FREESTYLE LIBRE READER) DEVI 1 Device by Does not apply route 2 (two) times daily. 1 Device 0  . CRESTOR 20 MG tablet TAKE 1 TABLET BY MOUTH EVERY DAY 90 tablet 3  . diphenhydramine-acetaminophen (TYLENOL PM) 25-500 MG TABS Take 2 tablets by mouth at bedtime.     [name] FLUoxetine (PROZAC)  40 MG capsule Take 1 capsule (40 mg total) by mouth daily. 90 capsule 1  . fluticasone (FLONASE) 50 MCG/ACT nasal spray Place 2 sprays into both nostrils daily. 16 g 5  . furosemide (LASIX) 20 MG tablet TAKE ONE TABLET DAILY IF NEEDED FOR LEG SWELLING 30 tablet 1  . gabapentin (NEURONTIN) 300 MG capsule Take 300 mg by mouth 2 (two) times daily.    . hydrALAZINE (APRESOLINE) 25 MG tablet Take 2 tablets (50 mg total) by mouth 3 (three) times daily. (Patient taking differently: Take 50 mg by mouth 2 (two) times daily. ) 180 tablet 11  . hydrOXYzine (ATARAX/VISTARIL) 25 MG tablet Take 1 tablet (25 mg total) by mouth at bedtime. 90 tablet 3  . ibuprofen (ADVIL,MOTRIN) 200 MG tablet Take 800 mg by mouth daily as needed for moderate pain.    . Insulin Detemir (LEVEMIR FLEXTOUCH) 100 UNIT/ML Pen Inject 10 Units into the skin daily. (Patient taking differently: Inject 18 Units into the skin daily. ) 15 mL 3  . Insulin Pen Needle 31G X 5 MM MISC Use to inject insulin as instructed once a day 100 each 1  . lovastatin (MEVACOR) 20 MG tablet Take 1 tablet (20 mg total) by mouth at bedtime. 90 tablet 3  . meclizine (ANTIVERT) 12.5 MG tablet Take 12.5 mg by mouth 3 (three) times daily as needed for dizziness.    . meloxicam (MOBIC) 15 MG tablet Take 15 mg by mouth daily.    . metFORMIN (GLUCOPHAGE-XR) 500 MG 24 hr tablet Take 2 tablets (1,000 mg total) by mouth 2 (two) times daily. 360 tablet 0  . montelukast (SINGULAIR) 10 MG tablet TAKE  1 TABLET (10 MG TOTAL) BY MOUTH AT BEDTIME. 90 tablet 3  . omega-3 acid ethyl esters (LOVAZA) 1 g capsule Take 1 capsule (1 g total) by mouth 2 (two) times daily. 60 capsule 11  . repaglinide (PRANDIN) 2 MG tablet Take 1 tablet (2 mg total) by mouth 3 (three) times daily before meals. 90 tablet 11  . Continuous Blood Gluc Sensor (FREESTYLE LIBRE SENSOR SYSTEM) MISC 3 each by Does not apply route 2 (two) times daily. (Patient not taking: Reported on 09/01/2017) 3 each 3  .  pantoprazole (PROTONIX) 40 MG tablet Take 1 tablet (40 mg total) by mouth daily. 30 tablet 3   No current facility-administered medications on file prior to visit.     Review of Systems:  As per HPI- otherwise negative.   Physical Examination: Vitals:   09/01/17 1522 09/01/17 1646  BP: (!) 167/82 (!) 152/72  Pulse: 90   SpO2: 95%    Vitals:   09/01/17 1522  Weight: 181 lb 12.8 oz (82.5 kg)  Height: 5' (1.524 m)   Body mass index is 35.51 kg/m. Ideal Body Weight: Weight in (lb) to have BMI = 25: 127.7  GEN: WDWN, NAD, Non-toxic, A & O x 3, obese, looks well but is upset today HEENT: Atraumatic, Normocephalic. Neck supple. No masses, No LAD. Ears and Nose: No external deformity. CV: 2/6 systolic murmur loudest of aortic valve today, No M/G/R. No JVD. No thrill. No extra heart sounds. PULM: CTA B, no wheezes, crackles, rhonchi. No retractions. No resp. distress. No accessory muscle use EXTR: No c/c/e NEURO Normal gait.  PSYCH: Normally interactive. Conversant. Tearful at times but is also able to remember good memories and laugh today.  She just got a new tattoo of "[name]" on her left ring finger.   EKG; compared with tracing from 2017 SR with new Q in V2- no other major changes from previous Assessment and Plan: Uncontrolled type 2 diabetes mellitus with complication, with long-term current use of insulin ([place]) - Plan: CBC, Hemoglobin P3X, Basic metabolic panel  Essential hypertension, benign - Plan: CBC, losartan (COZAAR) 50 MG tablet, metoprolol succinate (TOPROL-XL) 100 MG 24 hr tablet  Chest pain, unspecified type - Plan: EKG 12-Lead, nitroGLYCERIN (NITROSTAT) 0.4 MG SL tablet  Lumbar pain with radiation down left leg - Plan: methocarbamol (ROBAXIN) 750 MG tablet  Cardiac murmur - Plan: ECHOCARDIOGRAM COMPLETE  Feeling grief  Following up today for diabetes recheck, but also dealing with recent death of her husband  Spent some time talking about [name] and about how  she is doing. Offered support and encouragement Refilled medications as above Noted a new murmur today- will obtain an echo She has also noted some atypical CP- likely due to stress,  However if echo is non- contributory and sx persist will refer back to cardiology I will be in touch with her pending her labs and we will see how her DM is doing  She is not always taking her BP meds but will try and do better  Signed Lamar Blinks, MD   I have reviewed today's MWE by Ms. Britt and agree with her documentation   Received her labs and echo 2/21-called pt but no answer. Planned message for pt: Her A1c is actually better than it was, likely due to her not eating as much Will have her go back on 10 units of insulin and titrate up gradually K is minimally low- will give her a 23meq K to take on days that  she uses her lasix Mild leukocytosis- looking back this has been present since 2013, stable.  Continue to monitor, will obtain pathology smear as well at follow-up lab draw  Called again 2/22- no answer.  Did LMOM that her A1c is better and that I am going to have her see cardiology.  Message to Truitt Merle, her cardiology provider, asking to have her scheduled for a recheck visit  Called 2/23 and did speak with her Asked her to go back on 10 units of insulin and not go up for now Call in [place] 10 for her to use on days that she uses lasix only  Asked her to see me in 2 months   Results for orders placed or performed in visit on 09/01/17  CBC  Result Value Ref Range   WBC 11.9 (H) 4.0 - 10.5 K/uL   RBC 4.82 3.87 - 5.11 Mil/uL   Platelets 296.0 150.0 - 400.0 K/uL   Hemoglobin 12.2 12.0 - 15.0 g/dL   HCT 37.9 36.0 - 46.0 %   MCV 78.6 78.0 - 100.0 fl   MCHC 32.2 30.0 - 36.0 g/dL   RDW 14.6 11.5 - 15.5 %  Hemoglobin A1c  Result Value Ref Range   Hgb A1c MFr Bld 8.2 (H) 4.6 - 6.5 %  Basic metabolic panel  Result Value Ref Range   Sodium 136 135 - 145 mEq/L   Potassium 3.3 (L) 3.5 -  5.1 mEq/L   Chloride 98 96 - 112 mEq/L   CO2 30 19 - 32 mEq/L   Glucose, Bld 240 (H) 70 - 99 mg/dL   BUN 14 6 - 23 mg/dL   Creatinine, Ser 0.67 0.40 - 1.20 mg/dL   Calcium 9.3 8.4 - 10.5 mg/dL   GFR 92.95 >60.00 mL/min   Echo report:  - Left ventricle: The cavity size was normal. There was moderate concentric hypertrophy. Systolic function was normal. The estimated ejection fraction was in the range of 60% to 65%. Wall motion was normal; there were no regional wall motion abnormalities. There was an increased relative contribution of atrial contraction to ventricular filling. Doppler parameters are consistent with abnormal left ventricular relaxation (grade 1 diastolic dysfunction). - Aortic valve: Valve area (VTI): 1.97 cm^2. Valve area (Vmax): 1.67 cm^2. Valve area (Vmean): 1.71 cm^2. - Left atrium: The atrium was mildly dilated. - Pulmonary arteries: Systolic pressure could not be accurately estimated.  Aortic valve:   Structurally normal valve.   Cusp separation was normal.  Doppler:  Transvalvular velocity was within the normal range. There was no stenosis. There was no regurgitation  Now has mild DD.    Will refer back to cardiology for an outpt follow-up visit

## 2017-09-01 ENCOUNTER — Ambulatory Visit: Payer: Medicare Other | Admitting: *Deleted

## 2017-09-01 ENCOUNTER — Ambulatory Visit (INDEPENDENT_AMBULATORY_CARE_PROVIDER_SITE_OTHER): Payer: Medicare Other | Admitting: Family Medicine

## 2017-09-01 ENCOUNTER — Encounter: Payer: Self-pay | Admitting: Family Medicine

## 2017-09-01 VITALS — BP 152/72 | HR 90 | Ht 60.0 in | Wt 181.8 lb

## 2017-09-01 DIAGNOSIS — I10 Essential (primary) hypertension: Secondary | ICD-10-CM | POA: Diagnosis not present

## 2017-09-01 DIAGNOSIS — E876 Hypokalemia: Secondary | ICD-10-CM | POA: Diagnosis not present

## 2017-09-01 DIAGNOSIS — R011 Cardiac murmur, unspecified: Secondary | ICD-10-CM

## 2017-09-01 DIAGNOSIS — Z794 Long term (current) use of insulin: Secondary | ICD-10-CM

## 2017-09-01 DIAGNOSIS — M79605 Pain in left leg: Secondary | ICD-10-CM

## 2017-09-01 DIAGNOSIS — F4321 Adjustment disorder with depressed mood: Secondary | ICD-10-CM

## 2017-09-01 DIAGNOSIS — E1165 Type 2 diabetes mellitus with hyperglycemia: Secondary | ICD-10-CM

## 2017-09-01 DIAGNOSIS — R079 Chest pain, unspecified: Secondary | ICD-10-CM

## 2017-09-01 DIAGNOSIS — E118 Type 2 diabetes mellitus with unspecified complications: Secondary | ICD-10-CM | POA: Diagnosis not present

## 2017-09-01 DIAGNOSIS — IMO0002 Reserved for concepts with insufficient information to code with codable children: Secondary | ICD-10-CM

## 2017-09-01 DIAGNOSIS — M545 Low back pain, unspecified: Secondary | ICD-10-CM

## 2017-09-01 DIAGNOSIS — Z0000 Encounter for general adult medical examination without abnormal findings: Secondary | ICD-10-CM

## 2017-09-01 MED ORDER — METOPROLOL SUCCINATE ER 100 MG PO TB24: 100.0000 mg | ORAL_TABLET | Freq: Every day | ORAL | 3 refills | Status: DC

## 2017-09-01 MED ORDER — NITROGLYCERIN 0.4 MG SL SUBL: 0.4000 mg | SUBLINGUAL_TABLET | SUBLINGUAL | 3 refills | Status: DC | PRN

## 2017-09-01 MED ORDER — METHOCARBAMOL 750 MG PO TABS: ORAL_TABLET | ORAL | 0 refills | Status: DC

## 2017-09-01 MED ORDER — LOSARTAN POTASSIUM 50 MG PO TABS: ORAL_TABLET | ORAL | 3 refills | Status: DC

## 2017-09-01 NOTE — Patient Instructions (Addendum)
Continue to eat heart healthy diet (full of fruits, vegetables, whole grains, lean protein, water--limit salt, fat, and sugar intake) and increase physical activity as tolerated.  Continue doing brain stimulating activities (puzzles, reading, adult coloring books, staying active) to keep memory sharp.   Bring a copy of your living will and/or healthcare power of attorney to your next office visit.   From J Copland-  It was good to see you today, but I am so sorry for the loss of [name].  I will miss seeing him in the office and know that you must miss him so much!  I will be in touch with your labs- we will plan to have you start back on levemir at 10 units a day- we will plan to increase gradually as needed. I will discuss with you when I get your labs back   I will also set you up for an echocardiogram due to your heart murmur noted today.  Please let me know if you have any change or worsening of your chest symptoms.     Health Maintenance for Postmenopausal Women Menopause is a normal process in which your reproductive ability comes to an end. This process happens gradually over a span of months to years, usually between the ages of 21 and 53. Menopause is complete when you have missed 12 consecutive menstrual periods. It is important to talk with your health care provider about some of the most common conditions that affect postmenopausal women, such as heart disease, cancer, and bone loss (osteoporosis). Adopting a healthy lifestyle and getting preventive care can help to promote your health and wellness. Those actions can also lower your chances of developing some of these common conditions. What should I know about menopause? During menopause, you may experience a number of symptoms, such as:  Moderate-to-severe hot flashes.  Night sweats.  Decrease in sex drive.  Mood swings.  Headaches.  Tiredness.  Irritability.  Memory problems.  Insomnia.  Choosing to treat or not to  treat menopausal changes is an individual decision that you make with your health care provider. What should I know about hormone replacement therapy and supplements? Hormone therapy products are effective for treating symptoms that are associated with menopause, such as hot flashes and night sweats. Hormone replacement carries certain risks, especially as you become older. If you are thinking about using estrogen or estrogen with progestin treatments, discuss the benefits and risks with your health care provider. What should I know about heart disease and stroke? Heart disease, heart attack, and stroke become more likely as you age. This may be due, in part, to the hormonal changes that your body experiences during menopause. These can affect how your body processes dietary fats, triglycerides, and cholesterol. Heart attack and stroke are both medical emergencies. There are many things that you can do to help prevent heart disease and stroke:  Have your blood pressure checked at least every 1-2 years. High blood pressure causes heart disease and increases the risk of stroke.  If you are 23-96 years old, ask your health care provider if you should take aspirin to prevent a heart attack or a stroke.  Do not use any tobacco products, including cigarettes, chewing tobacco, or electronic cigarettes. If you need help quitting, ask your health care provider.  It is important to eat a healthy diet and maintain a healthy weight. ? Be sure to include plenty of vegetables, fruits, low-fat dairy products, and lean protein. ? Avoid eating foods that are  high in solid fats, added sugars, or salt (sodium).  Get regular exercise. This is one of the most important things that you can do for your health. ? Try to exercise for at least 150 minutes each week. The type of exercise that you do should increase your heart rate and make you sweat. This is known as moderate-intensity exercise. ? Try to do strengthening  exercises at least twice each week. Do these in addition to the moderate-intensity exercise.  Know your numbers.Ask your health care provider to check your cholesterol and your blood glucose. Continue to have your blood tested as directed by your health care provider.  What should I know about cancer screening? There are several types of cancer. Take the following steps to reduce your risk and to catch any cancer development as early as possible. Breast Cancer  Practice breast self-awareness. ? This means understanding how your breasts normally appear and feel. ? It also means doing regular breast self-exams. Let your health care provider know about any changes, no matter how small.  If you are 77 or older, have a clinician do a breast exam (clinical breast exam or CBE) every year. Depending on your age, family history, and medical history, it may be recommended that you also have a yearly breast X-ray (mammogram).  If you have a family history of breast cancer, talk with your health care provider about genetic screening.  If you are at high risk for breast cancer, talk with your health care provider about having an MRI and a mammogram every year.  Breast cancer (BRCA) gene test is recommended for women who have family members with BRCA-related cancers. Results of the assessment will determine the need for genetic counseling and BRCA1 and for BRCA2 testing. BRCA-related cancers include these types: ? Breast. This occurs in males or females. ? Ovarian. ? Tubal. This may also be called fallopian tube cancer. ? Cancer of the abdominal or pelvic lining (peritoneal cancer). ? Prostate. ? Pancreatic.  Cervical, Uterine, and Ovarian Cancer Your health care provider may recommend that you be screened regularly for cancer of the pelvic organs. These include your ovaries, uterus, and vagina. This screening involves a pelvic exam, which includes checking for microscopic changes to the surface of  your cervix (Pap test).  For women ages 21-65, health care providers may recommend a pelvic exam and a Pap test every three years. For women ages 11-65, they may recommend the Pap test and pelvic exam, combined with testing for human papilloma virus (HPV), every five years. Some types of HPV increase your risk of cervical cancer. Testing for HPV may also be done on women of any age who have unclear Pap test results.  Other health care providers may not recommend any screening for nonpregnant women who are considered low risk for pelvic cancer and have no symptoms. Ask your health care provider if a screening pelvic exam is right for you.  If you have had past treatment for cervical cancer or a condition that could lead to cancer, you need Pap tests and screening for cancer for at least 20 years after your treatment. If Pap tests have been discontinued for you, your risk factors (such as having a new sexual partner) need to be reassessed to determine if you should start having screenings again. Some women have medical problems that increase the chance of getting cervical cancer. In these cases, your health care provider may recommend that you have screening and Pap tests more often.  If  you have a family history of uterine cancer or ovarian cancer, talk with your health care provider about genetic screening.  If you have vaginal bleeding after reaching menopause, tell your health care provider.  There are currently no reliable tests available to screen for ovarian cancer.  Lung Cancer Lung cancer screening is recommended for adults 61-53 years old who are at high risk for lung cancer because of a history of smoking. A yearly low-dose CT scan of the lungs is recommended if you:  Currently smoke.  Have a history of at least 30 pack-years of smoking and you currently smoke or have quit within the past 15 years. A pack-year is smoking an average of one pack of cigarettes per day for one year.  Yearly  screening should:  Continue until it has been 15 years since you quit.  Stop if you develop a health problem that would prevent you from having lung cancer treatment.  Colorectal Cancer  This type of cancer can be detected and can often be prevented.  Routine colorectal cancer screening usually begins at age 5 and continues through age 73.  If you have risk factors for colon cancer, your health care provider may recommend that you be screened at an earlier age.  If you have a family history of colorectal cancer, talk with your health care provider about genetic screening.  Your health care provider may also recommend using home test kits to check for hidden blood in your stool.  A small camera at the end of a tube can be used to examine your colon directly (sigmoidoscopy or colonoscopy). This is done to check for the earliest forms of colorectal cancer.  Direct examination of the colon should be repeated every 5-10 years until age 91. However, if early forms of precancerous polyps or small growths are found or if you have a family history or genetic risk for colorectal cancer, you may need to be screened more often.  Skin Cancer  Check your skin from head to toe regularly.  Monitor any moles. Be sure to tell your health care provider: ? About any new moles or changes in moles, especially if there is a change in a mole's shape or color. ? If you have a mole that is larger than the size of a pencil eraser.  If any of your family members has a history of skin cancer, especially at a young age, talk with your health care provider about genetic screening.  Always use sunscreen. Apply sunscreen liberally and repeatedly throughout the day.  Whenever you are outside, protect yourself by wearing long sleeves, pants, a wide-brimmed hat, and sunglasses.  What should I know about osteoporosis? Osteoporosis is a condition in which bone destruction happens more quickly than new bone creation.  After menopause, you may be at an increased risk for osteoporosis. To help prevent osteoporosis or the bone fractures that can happen because of osteoporosis, the following is recommended:  If you are 41-5 years old, get at least 1,000 mg of calcium and at least 600 mg of vitamin D per day.  If you are older than age 92 but younger than age 63, get at least 1,200 mg of calcium and at least 600 mg of vitamin D per day.  If you are older than age 7, get at least 1,200 mg of calcium and at least 800 mg of vitamin D per day.  Smoking and excessive alcohol intake increase the risk of osteoporosis. Eat foods that are rich in  calcium and vitamin D, and do weight-bearing exercises several times each week as directed by your health care provider. What should I know about how menopause affects my mental health? Depression may occur at any age, but it is more common as you become older. Common symptoms of depression include:  Low or sad mood.  Changes in sleep patterns.  Changes in appetite or eating patterns.  Feeling an overall lack of motivation or enjoyment of activities that you previously enjoyed.  Frequent crying spells.  Talk with your health care provider if you think that you are experiencing depression. What should I know about immunizations? It is important that you get and maintain your immunizations. These include:  Tetanus, diphtheria, and pertussis (Tdap) booster vaccine.  Influenza every year before the flu season begins.  Pneumonia vaccine.  Shingles vaccine.  Your health care provider may also recommend other immunizations. This information is not intended to replace advice given to you by your health care provider. Make sure you discuss any questions you have with your health care provider. Document Released: 08/23/2005 Document Revised: 01/19/2016 Document Reviewed: 04/04/2015 Elsevier Interactive Patient Education  2018 [employer].

## 2017-09-02 LAB — BASIC METABOLIC PANEL
BUN: 14 mg/dL (ref 6–23)
CO2: 30 mEq/L (ref 19–32)
Calcium: 9.3 mg/dL (ref 8.4–10.5)
Chloride: 98 mEq/L (ref 96–112)
Creatinine, Ser: 0.67 mg/dL (ref 0.40–1.20)
GFR: 92.95 mL/min (ref >60.00)
Glucose, Bld: 240 mg/dL — ABNORMAL HIGH (ref 70–99)
Potassium: 3.3 mEq/L — ABNORMAL LOW (ref 3.5–5.1)
Sodium: 136 mEq/L (ref 135–145)

## 2017-09-02 LAB — CBC
HCT: 37.9 % (ref 36.0–46.0)
Hemoglobin: 12.2 g/dL (ref 12.0–15.0)
MCHC: 32.2 g/dL (ref 30.0–36.0)
MCV: 78.6 fl (ref 78.0–100.0)
Platelets: 296 10*3/uL (ref 150.0–400.0)
RBC: 4.82 Mil/uL (ref 3.87–5.11)
RDW: 14.6 % (ref 11.5–15.5)
WBC: 11.9 10*3/uL — ABNORMAL HIGH (ref 4.0–10.5)

## 2017-09-02 LAB — HEMOGLOBIN A1C: Hgb A1c MFr Bld: 8.2 % — ABNORMAL HIGH (ref 4.6–6.5)

## 2017-09-03 ENCOUNTER — Ambulatory Visit (HOSPITAL_COMMUNITY): Payer: Medicare Other | Attending: Cardiology

## 2017-09-03 ENCOUNTER — Other Ambulatory Visit: Payer: Self-pay

## 2017-09-03 DIAGNOSIS — Z6835 Body mass index (BMI) 35.0-35.9, adult: Secondary | ICD-10-CM | POA: Insufficient documentation

## 2017-09-03 DIAGNOSIS — E785 Hyperlipidemia, unspecified: Secondary | ICD-10-CM | POA: Diagnosis not present

## 2017-09-03 DIAGNOSIS — E119 Type 2 diabetes mellitus without complications: Secondary | ICD-10-CM | POA: Diagnosis not present

## 2017-09-03 DIAGNOSIS — R011 Cardiac murmur, unspecified: Secondary | ICD-10-CM

## 2017-09-03 DIAGNOSIS — G4733 Obstructive sleep apnea (adult) (pediatric): Secondary | ICD-10-CM | POA: Insufficient documentation

## 2017-09-03 DIAGNOSIS — I119 Hypertensive heart disease without heart failure: Secondary | ICD-10-CM | POA: Insufficient documentation

## 2017-09-03 DIAGNOSIS — E669 Obesity, unspecified: Secondary | ICD-10-CM | POA: Insufficient documentation

## 2017-09-03 DIAGNOSIS — I2510 Atherosclerotic heart disease of native coronary artery without angina pectoris: Secondary | ICD-10-CM | POA: Diagnosis not present

## 2017-09-03 LAB — ECHOCARDIOGRAM COMPLETE
AO mean calculated velocity dopler: 138 cm/s
AV Area VTI index: 1.1 cm2/m2
AV Area VTI: 1.67 cm2
AV Area mean vel: 1.71 cm2
AV Mean grad: 9 mmHg
AV Peak grad: 21 mmHg
AV VEL mean LVOT/AV: 0.54
AV area mean vel ind: 0.95 cm2/m2
AV peak Index: 0.93
AV pk vel: 230 cm/s
AV vel: 1.97
Ao pk vel: 0.53 m/s
E decel time: 204 msec
E/e' ratio: 11.66
FS: 49 % — AB (ref 28–44)
IVS/LV PW RATIO, ED: 1.01
LA ID, A-P, ES: 30 mm
LA diam end sys: 30 mm
LA diam index: 1.68 cm/m2
LA vol A4C: 63 ml
LA vol index: 37.4 mL/m2
LA vol: 67 mL
LV E/e' medial: 11.66
LV E/e'average: 11.66
LV PW d: 14.2 mm — AB (ref 0.6–1.1)
LV e' LATERAL: 7.79 cm/s
LVOT SV: 74 mL
LVOT VTI: 23.6 cm
LVOT area: 3.14 cm2
LVOT diameter: 20 mm
LVOT peak VTI: 0.63 cm
LVOT peak grad rest: 6 mmHg
LVOT peak vel: 122 cm/s
MV Dec: 204
MV pk A vel: 122 m/s
MV pk E vel: 90.8 m/s
Peak grad: 3 mmHg
TDI e' lateral: 7.79
TDI e' medial: 6.91
VTI: 37.6 cm
Valve area index: 1.1
Valve area: 1.97 cm2

## 2017-09-06 MED ORDER — POTASSIUM CHLORIDE ER 10 MEQ PO TBCR: 10.0000 meq | EXTENDED_RELEASE_TABLET | Freq: Every day | ORAL | 3 refills | Status: DC

## 2017-09-06 NOTE — Addendum Note (Signed)
Addended by: Lamar Blinks C on: 09/06/2017 08:50 AM   Modules accepted: Orders

## 2017-09-08 ENCOUNTER — Other Ambulatory Visit: Payer: Self-pay | Admitting: Family Medicine

## 2017-09-08 ENCOUNTER — Telehealth: Payer: Self-pay | Admitting: *Deleted

## 2017-09-08 ENCOUNTER — Other Ambulatory Visit: Payer: Self-pay | Admitting: Emergency Medicine

## 2017-09-08 DIAGNOSIS — E119 Type 2 diabetes mellitus without complications: Secondary | ICD-10-CM

## 2017-09-08 HISTORY — DX: Type 2 diabetes mellitus without complications: E11.9

## 2017-09-08 NOTE — Telephone Encounter (Signed)
S/w pt scheduled appt for Feb 27.

## 2017-09-09 DIAGNOSIS — H0279 Other degenerative disorders of eyelid and periocular area: Secondary | ICD-10-CM | POA: Diagnosis not present

## 2017-09-09 DIAGNOSIS — H02834 Dermatochalasis of left upper eyelid: Secondary | ICD-10-CM | POA: Diagnosis not present

## 2017-09-09 DIAGNOSIS — H53483 Generalized contraction of visual field, bilateral: Secondary | ICD-10-CM | POA: Diagnosis not present

## 2017-09-09 DIAGNOSIS — H02831 Dermatochalasis of right upper eyelid: Secondary | ICD-10-CM | POA: Diagnosis not present

## 2017-09-09 DIAGNOSIS — H02413 Mechanical ptosis of bilateral eyelids: Secondary | ICD-10-CM | POA: Diagnosis not present

## 2017-09-09 DIAGNOSIS — H02423 Myogenic ptosis of bilateral eyelids: Secondary | ICD-10-CM | POA: Diagnosis not present

## 2017-09-10 ENCOUNTER — Encounter: Payer: Self-pay | Admitting: Nurse Practitioner

## 2017-09-10 ENCOUNTER — Ambulatory Visit (INDEPENDENT_AMBULATORY_CARE_PROVIDER_SITE_OTHER): Payer: Medicare Other | Admitting: Nurse Practitioner

## 2017-09-10 VITALS — BP 180/90 | HR 85 | Ht 60.0 in | Wt 179.0 lb

## 2017-09-10 DIAGNOSIS — R079 Chest pain, unspecified: Secondary | ICD-10-CM

## 2017-09-10 MED ORDER — LOSARTAN POTASSIUM 100 MG PO TABS: 100.0000 mg | ORAL_TABLET | Freq: Every day | ORAL | 3 refills | Status: DC

## 2017-09-10 NOTE — Patient Instructions (Addendum)
We will be checking the following labs today - NONE   Medication Instructions:    Continue with your current medicines. BUT  I am increasing the Losartan to 100 mg a day - you can take 2 of the 50 mg tablets once a day - I have sent the RX for the 100 mg to your pharmacy.      Testing/Procedures To Be Arranged:  Lexiscan Myoview  Follow-Up:   See me after the stress test  Needs OV with Dr. Lamonte Sakai to follow up for her breathing/sleep apnea/cough    Other Special Instructions:   N/A    If you need a refill on your cardiac medications before your next appointment, please call your pharmacy.   Call the [place] office at [phone] if you have any questions, problems or concerns.

## 2017-09-10 NOTE — Progress Notes (Signed)
CARDIOLOGY OFFICE NOTE  Date:  09/10/2017    Destiny Ballard Date of Birth: 12-14-1950 Medical Record #[id number]  PCP:  Destiny Mclean, MD  Cardiologist:  Destiny Ballard   Chief Complaint  Patient presents with  . Coronary Artery Disease  . Chest Pain    Work in visit     History of Present Illness: Destiny Ballard is a 67 y.o. female who presents today for a work in visit. Former patient of Dr. Susa Ballard and Dr. Claris Ballard - has primarily followed with me.   Last seen here in August of 2017.   She has a medical history significant of hypertension, hyperlipidemia, diabetes mellitus, GERD, anxiety, morbid obesity, & CAD, s/p stent placement 2007. Her last cath was in 2017 and was stable. She has had a tendency towards medication non compliance.   Comes in today. Here with her daughter Destiny Ballard today. Her husband "Destiny Ballard" who was a long term patient of mine died right after Christmas. She has not done well with this. She does not feel like doing anything. She is crying - pretty incessantly. She says she is taking her medicines - but can't tell me what any of them are. Destiny Ballard says she can verify her medicines. Seem to still be lots of interesting dynamics within the family. She has chest pain - described as a tightness - has had this chronically - hard to say if any worse. She is not active. Not walking. A1C noted. Still with her cough/wheezing - she has not kept her follow up with Dr. Lamonte Ballard.   Past Medical History:  Diagnosis Date  . Anxiety    Prior suicide attempt  . CAD (coronary artery disease)    a) s/p DES to LAD 07/2005 b) Last Myoview low risk 11/2011 showing small fixed apical perfusion defect (prior MI vs attenuation) but no ischemia - normal EF.  [name] Cervical spondylosis   . Coronary atherosclerosis 06/28/2008  . Depression   . Depression with anxiety   . Diabetes mellitus without complication ([place])   . GERD (gastroesophageal reflux disease)   . Hyperlipidemia   .  Hypertension   . Insulin resistance   . Iron deficiency anemia   . Obesity     Past Surgical History:  Procedure Laterality Date  . BREAST ENHANCEMENT SURGERY    . CARDIAC CATHETERIZATION  06/17/2007   NORMAL. EF 60%  . CARDIAC CATHETERIZATION N/A 01/29/2016   Procedure: Left Heart Cath and Coronary Angiography;  Surgeon: Destiny Mocha, MD;  Location: [facility];  Service: Cardiovascular;  Laterality: N/A;  . CERVICAL SPONDYLOSIS     SINGLE LEVEL FUSION  . CHILDBIRTH     X3  . CORONARY STENT PLACEMENT  07/2005   LEFT ANTERIOR DESCENDING  . FOREARM FRACTURE SURGERY  2010   hand and shoulder   . INCISION AND DRAINAGE BREAST ABSCESS  01/05/2012      . INCISION AND DRAINAGE PERIRECTAL ABSCESS N/A 02/18/2014   Procedure: IRRIGATION AND DEBRIDEMENT PERIRECTAL ABSCESS;  Surgeon: Destiny Earls, MD;  Location: [facility];  Service: General;  Laterality: N/A;  . LUMBAR LAMINECTOMY    . ROTATOR CUFF REPAIR     bilaterla  . TONSILLECTOMY AND ADENOIDECTOMY    . TUBAL LIGATION    . VIDEO BRONCHOSCOPY Bilateral 08/13/2016   Procedure: VIDEO BRONCHOSCOPY WITHOUT FLUORO;  Surgeon: Destiny Gobble, MD;  Location: [facility];  Service: Cardiopulmonary;  Laterality: Bilateral;     Medications: Current Meds  Medication  Sig  . acetaminophen (TYLENOL) 650 MG CR tablet Take 1,300 mg by mouth every 8 (eight) hours as needed for pain.  [name] albuterol (PROAIR HFA) 108 (90 Base) MCG/ACT inhaler Inhale 2 puffs into the lungs every 4 (four) hours as needed for wheezing or shortness of breath.  [name] amLODipine (NORVASC) 10 MG tablet TAKE 1 TABLET BY MOUTH EVERY DAY  . aspirin EC 81 MG tablet Take 81 mg by mouth daily.  . benzonatate (TESSALON) 100 MG capsule Take 1 capsule (100 mg total) by mouth 3 (three) times daily as needed for cough.  . bromocriptine (PARLODEL) 2.5 MG tablet TAKE 1/2 TABLET (1.25 MG TOTAL) BY MOUTH AT BEDTIME.  . calcium carbonate (TUMS - DOSED IN MG ELEMENTAL CALCIUM) 500 MG chewable  tablet Chew 2 tablets by mouth daily as needed for indigestion or heartburn.  . cetirizine (ZYRTEC) 10 MG tablet TAKE 1 TABLET BY MOUTH DAILY AS NEEDED FOR ALLERGIES  . Cholecalciferol (VITAMIN D3) 1000 UNITS CAPS Take 1,000 Units by mouth daily.   . clonazePAM (KLONOPIN) 0.5 MG tablet TAKE 0.5 TABLETS (0.25 MG TOTAL) BY MOUTH 2 TIMES DAILY AS NEEDED FOR ANXIETY  . Continuous Blood Gluc Receiver (FREESTYLE LIBRE READER) DEVI 1 Device by Does not apply route 2 (two) times daily.  . Continuous Blood Gluc Sensor (FREESTYLE LIBRE SENSOR SYSTEM) MISC 3 each by Does not apply route 2 (two) times daily.  . CRESTOR 20 MG tablet TAKE 1 TABLET BY MOUTH EVERY DAY  . diphenhydramine-acetaminophen (TYLENOL PM) 25-500 MG TABS Take 2 tablets by mouth at bedtime.   [name] FLUoxetine (PROZAC) 40 MG capsule Take 1 capsule (40 mg total) by mouth daily.  . fluticasone (FLONASE) 50 MCG/ACT nasal spray Place 2 sprays into both nostrils daily.  . furosemide (LASIX) 20 MG tablet TAKE ONE TABLET DAILY IF NEEDED FOR LEG SWELLING  . gabapentin (NEURONTIN) 300 MG capsule Take 300 mg by mouth 2 (two) times daily.  . hydrALAZINE (APRESOLINE) 25 MG tablet Take 2 tablets (50 mg total) by mouth 3 (three) times daily. (Patient taking differently: Take 50 mg by mouth 2 (two) times daily. )  . hydrOXYzine (ATARAX/VISTARIL) 25 MG tablet Take 1 tablet (25 mg total) by mouth at bedtime.  [name] ibuprofen (ADVIL,MOTRIN) 200 MG tablet Take 800 mg by mouth daily as needed for moderate pain.  . Insulin Detemir (LEVEMIR FLEXTOUCH) 100 UNIT/ML Pen Inject 10 Units into the skin daily. (Patient taking differently: Inject 18 Units into the skin daily. )  . Insulin Pen Needle 31G X 5 MM MISC Use to inject insulin as instructed once a day  . lovastatin (MEVACOR) 20 MG tablet Take 1 tablet (20 mg total) by mouth at bedtime.  . meclizine (ANTIVERT) 12.5 MG tablet Take 12.5 mg by mouth 3 (three) times daily as needed for dizziness.  . meloxicam (MOBIC) 15 MG  tablet Take 15 mg by mouth daily.  . metFORMIN (GLUCOPHAGE-XR) 500 MG 24 hr tablet Take 2 tablets (1,000 mg total) by mouth 2 (two) times daily.  . methocarbamol (ROBAXIN) 750 MG tablet TAKE 1 TABLET BY MOUTH EVERY 12 HOURS AS NEEDED FOR MUSCLE SPASMS  . metoprolol succinate (TOPROL-XL) 100 MG 24 hr tablet Take 1 tablet (100 mg total) by mouth daily.  . montelukast (SINGULAIR) 10 MG tablet TAKE 1 TABLET (10 MG TOTAL) BY MOUTH AT BEDTIME.  . nitroGLYCERIN (NITROSTAT) 0.4 MG SL tablet Place 1 tablet (0.4 mg total) under the tongue every 5 (five) minutes as needed for chest  pain.  . omega-3 acid ethyl esters (LOVAZA) 1 g capsule Take 1 capsule (1 g total) by mouth 2 (two) times daily.  . potassium chloride (K-DUR) 10 MEQ tablet Take 1 tablet (10 mEq total) by mouth daily. Take only on days that you use lasix/ furosemide  . repaglinide (PRANDIN) 2 MG tablet Take 1 tablet (2 mg total) by mouth 3 (three) times daily before meals.  . [DISCONTINUED] losartan (COZAAR) 50 MG tablet TAKE 1 TABLET BY MOUTH EVERY DAY     Allergies: Allergies  Allergen Reactions  . Prednisone Other (See Comments)    REACTION: mood swings, nightmares. "Shot doesn't bother me, reaction is just with the pill" she states she has had the steroid injections before. From our records methylprednisone was given to her in 2013 without any complications.    Social History: The patient  reports that  has never smoked. she has never used smokeless tobacco. She reports that she drinks alcohol. She reports that she does not use drugs.   Family History: The patient's family history includes Allergies in her other; Asthma in her daughter and mother; Cancer in her daughter and sister; Cervical cancer in her daughter; Diabetes in her mother and sister; Heart attack in her mother; Heart disease in her mother; Lung cancer in her mother; Suicidality in her father.   Review of Systems: Please see the history of present illness.   Otherwise,  the review of systems is positive for none.   All other systems are reviewed and negative.   Physical Exam: VS:  BP (!) 180/90 (BP Location: Left Arm, Patient Position: Sitting, Cuff Size: Normal) Comment: right arm 180/90  Pulse 85   Ht 5' (1.524 m)   Wt 179 lb (81.2 kg)   BMI 34.96 kg/m  .  BMI Body mass index is 34.96 kg/m.  Wt Readings from Last 3 Encounters:  09/10/17 179 lb (81.2 kg)  09/01/17 181 lb 12.8 oz (82.5 kg)  04/03/17 183 lb 6.4 oz (83.2 kg)   BP is 180/90 in both arms by me.   General: Very tearful. Alert and in no acute distress.  She remains obese.  HEENT: Normal.  Neck: Supple, no JVD, carotid bruits, or masses noted.  Cardiac: Regular rate and rhythm. No murmurs, rubs, or gallops. No edema.  Respiratory:  Lungs are coarse. Has high stridor cough noted.  GI: Soft and nontender.  MS: No deformity or atrophy. Gait and ROM intact.  Skin: Warm and dry. Color is normal.  Neuro:  Strength and sensation are intact and no gross focal deficits noted.  Psych: Alert, appropriate and with normal affect.   LABORATORY DATA:  EKG:  EKG is ordered today. This demonstrates NSR - septal Q's - unchanged.  Lab Results  Component Value Date   WBC 11.9 (H) 09/01/2017   HGB 12.2 09/01/2017   HCT 37.9 09/01/2017   PLT 296.0 09/01/2017   GLUCOSE 240 (H) 09/01/2017   CHOL 239 (H) 04/03/2017   TRIG 389.0 (H) 04/03/2017   HDL 52.20 04/03/2017   LDLDIRECT 100.0 04/03/2017   LDLCALC UNABLE TO CALCULATE IF TRIGLYCERIDE OVER 400 mg/dL 01/27/2016   ALT 19 04/03/2017   AST 13 04/03/2017   NA 136 09/01/2017   K 3.3 (L) 09/01/2017   CL 98 09/01/2017   CREATININE 0.67 09/01/2017   BUN 14 09/01/2017   CO2 30 09/01/2017   TSH 2.361 01/27/2016   INR 0.92 01/27/2016   HGBA1C 8.2 (H) 09/01/2017   MICROALBUR <0.7 07/24/2016  BNP (last 3 results) No results for input(s): BNP in the last 8760 hours.  ProBNP (last 3 results) No results for input(s): PROBNP in the last 8760  hours.   Other Studies Reviewed Today:   Echo Study Conclusions 08/2017  - Left ventricle: The cavity size was normal. There was moderate   concentric hypertrophy. Systolic function was normal. The   estimated ejection fraction was in the range of 60% to 65%. Wall   motion was normal; there were no regional wall motion   abnormalities. There was an increased relative contribution of   atrial contraction to ventricular filling. Doppler parameters are   consistent with abnormal left ventricular relaxation (grade 1   diastolic dysfunction). - Aortic valve: Valve area (VTI): 1.97 cm^2. Valve area (Vmax):   1.67 cm^2. Valve area (Vmean): 1.71 cm^2. - Left atrium: The atrium was mildly dilated. - Pulmonary arteries: Systolic pressure could not be accurately   estimated.     Heart cath: 01/29/16 1. Continued patency of the LAD stent 2. Mild nonobstructive CAD  3. Normal LV function by echo assessment  Assessment/Plan:  1. Chest pain and CAD:  s/p of stent 2007. Stable cardiac cath findings from 2017. I suspect a lot of her symptoms are from grief - will get her stress test updated. Needs BP control. She is not interested in really any CV risk factor modifications at this time - she is just too sad.   2. HTN: BP not controlled - ARB increased. Daughter is to confirm her medicines.   3. GERD: -will continue Protonix  4. DM-II:uncontrolled  5. HLD with hypertriglyceridemia:most likely from poor diabetic control as well.   6. Chronic cough and chronic mild SOB: followed by pulmonary - needs to get back there - will try to get her an appointment.   7. Situational stress/grief -  sad situation.   Current medicines are reviewed with the patient today.  The patient does not have concerns regarding medicines other than what has been noted above.  The following changes have been made:  See above.  Labs/ tests ordered today include:    Orders Placed This Encounter    Procedures  . EKG 12-Lead     Disposition:   FU with me after the stress test. Daughter to verify medicines. ARB increased today. Lab on return.   Patient is agreeable to this plan and will call if any problems develop in the interim.   SignedTruitt Merle, NP  09/10/2017 2:32 PM  [facility] [address] [place],   [address] Phone: [phone] Fax: [phone]

## 2017-09-11 ENCOUNTER — Other Ambulatory Visit: Payer: Self-pay | Admitting: Nurse Practitioner

## 2017-09-11 ENCOUNTER — Ambulatory Visit (HOSPITAL_COMMUNITY): Payer: Medicare Other | Attending: Cardiology

## 2017-09-11 DIAGNOSIS — R079 Chest pain, unspecified: Secondary | ICD-10-CM | POA: Diagnosis not present

## 2017-09-11 LAB — MYOCARDIAL PERFUSION IMAGING
LV dias vol: 74 mL (ref 46–106)
LV sys vol: 27 mL
Peak HR: 90 {beats}/min
RATE: 0.29
Rest HR: 75 {beats}/min
SDS: 7
SRS: 4
SSS: 11
TID: 0.89

## 2017-09-11 MED ORDER — REGADENOSON 0.4 MG/5ML IV SOLN
0.4000 mg | Freq: Once | INTRAVENOUS | Status: AC
  Administered 2017-09-11: 0.4 mg via INTRAVENOUS

## 2017-09-11 MED ORDER — TECHNETIUM TC 99M TETROFOSMIN IV KIT
32.7000 | PACK | Freq: Once | INTRAVENOUS | Status: AC | PRN
  Administered 2017-09-11: 32.7 via INTRAVENOUS
  Filled 2017-09-11: qty 33

## 2017-09-11 MED ORDER — TECHNETIUM TC 99M TETROFOSMIN IV KIT
10.7000 | PACK | Freq: Once | INTRAVENOUS | Status: AC | PRN
  Administered 2017-09-11: 10.7 via INTRAVENOUS
  Filled 2017-09-11: qty 11

## 2017-09-15 ENCOUNTER — Encounter: Payer: Self-pay | Admitting: Nurse Practitioner

## 2017-09-15 ENCOUNTER — Ambulatory Visit (INDEPENDENT_AMBULATORY_CARE_PROVIDER_SITE_OTHER): Payer: Medicare Other | Admitting: Emergency Medicine

## 2017-09-15 ENCOUNTER — Ambulatory Visit (INDEPENDENT_AMBULATORY_CARE_PROVIDER_SITE_OTHER): Payer: Medicare Other | Admitting: Nurse Practitioner

## 2017-09-15 ENCOUNTER — Encounter: Payer: Self-pay | Admitting: Emergency Medicine

## 2017-09-15 VITALS — BP 150/70 | HR 94 | Ht 60.0 in | Wt 180.0 lb

## 2017-09-15 DIAGNOSIS — R079 Chest pain, unspecified: Secondary | ICD-10-CM | POA: Diagnosis not present

## 2017-09-15 DIAGNOSIS — G4733 Obstructive sleep apnea (adult) (pediatric): Secondary | ICD-10-CM

## 2017-09-15 DIAGNOSIS — J310 Chronic rhinitis: Secondary | ICD-10-CM | POA: Diagnosis not present

## 2017-09-15 DIAGNOSIS — Z01810 Encounter for preprocedural cardiovascular examination: Secondary | ICD-10-CM | POA: Diagnosis not present

## 2017-09-15 DIAGNOSIS — I259 Chronic ischemic heart disease, unspecified: Secondary | ICD-10-CM

## 2017-09-15 DIAGNOSIS — R05 Cough: Secondary | ICD-10-CM

## 2017-09-15 DIAGNOSIS — K219 Gastro-esophageal reflux disease without esophagitis: Secondary | ICD-10-CM | POA: Diagnosis not present

## 2017-09-15 DIAGNOSIS — R053 Chronic cough: Secondary | ICD-10-CM

## 2017-09-15 DIAGNOSIS — R9439 Abnormal result of other cardiovascular function study: Secondary | ICD-10-CM

## 2017-09-15 DIAGNOSIS — J449 Chronic obstructive pulmonary disease, unspecified: Secondary | ICD-10-CM

## 2017-09-15 MED ORDER — ALBUTEROL SULFATE HFA 108 (90 BASE) MCG/ACT IN AERS: 2.0000 | INHALATION_SPRAY | RESPIRATORY_TRACT | 1 refills | Status: DC | PRN

## 2017-09-15 MED ORDER — MONTELUKAST SODIUM 10 MG PO TABS: 10.0000 mg | ORAL_TABLET | Freq: Every day | ORAL | 1 refills | Status: DC

## 2017-09-15 MED ORDER — FLUTICASONE PROPIONATE 50 MCG/ACT NA SUSP: 2.0000 | Freq: Every day | NASAL | 1 refills | Status: DC

## 2017-09-15 MED ORDER — TIOTROPIUM BROMIDE MONOHYDRATE 2.5 MCG/ACT IN AERS: 2.0000 | INHALATION_SPRAY | Freq: Every day | RESPIRATORY_TRACT | 0 refills | Status: DC

## 2017-09-15 NOTE — Assessment & Plan Note (Signed)
Less bothersome than on previous visits.  She does still sometimes use Tums depending on what she is eating.  We may need to restart a PPI at some point if we believe that this is contributing to the chronic cough.

## 2017-09-15 NOTE — Assessment & Plan Note (Signed)
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  She does seem to get some clinical benefit from albuterol.  We will do a trial of scheduled bronchodilator, Spiriva Respimat to see if she benefits.  Continue to keep albuterol available to use as needed

## 2017-09-15 NOTE — Assessment & Plan Note (Signed)
Largely unchanged.  Contributors include GERD that is not currently treated (although few symptoms), chronic rhinitis, untreated sleep apnea with potential upper airway impact, suspected COPD which is currently untreated.  She is also on losartan which is sometimes been affiliated with cough.  We could consider change sometime in the future depending on her progress.

## 2017-09-15 NOTE — Assessment & Plan Note (Signed)
Currently using Singulair, Zyrtec.  She has fluticasone nasal spray available.  I will ask her to start taking this every day during the allergy season.  Change her Singulair timing to the evening.

## 2017-09-15 NOTE — Patient Instructions (Addendum)
We will be checking the following labs today - BMET, CBC, PT & PTT   Medication Instructions:    Continue with your current medicines.     Testing/Procedures To Be Arranged:  Cardiac catheterization  Follow-Up:   See me in 4 to 6 weeks - bring all your medicine bottles    Other Special Instructions:   Your provider has recommended a cardiac catherization  You are scheduled for a cardiac catheterization on Thursday, March 7th at 1:30 PM with Dr. Irish Lack or Dr. Angelena Form or associate.  Please arrive at the [facility] (Main Entrance) at [facility] at [address], [place] Stay on Thursday, March 7th as 11:30 AM    Special note: Every effort is made to have your procedure done on time.   Please understand that emergencies sometimes delay a scheduled   procedure.  No food or drink after midnight on Wednesday.  You may take your morning medications with a sip of water on the day of your procedure.  Please take a baby aspirin (81 mg) on the morning of your procedure.   Medications to [facility] for a one night stay -- bring personal belongings.  Bring a current list of your medications and current insurance cards.  You MUST have a responsible person to drive you home. Someone MUST be with you the first 24 hours after you arrive home or your discharge will be delayed. Wear clothes that are easy to get on and off and wear slip on shoes.    Coronary Angiogram A coronary angiogram, also called coronary angiography, is an X-ray procedure used to look at the arteries in the heart. In this procedure, a dye (contrast dye) is injected through a long, hollow tube (catheter). The catheter is about the size of a piece of cooked spaghetti and is inserted through your groin, wrist, or arm. The dye is injected into each artery, and X-rays are then taken to show if there is a  blockage in the arteries of your heart.  LET [facility] CARE PROVIDER KNOW ABOUT: Any allergies you have, including allergies to shellfish or contrast dye.  All medicines you are taking, including vitamins, herbs, eye drops, creams, and over-the-counter medicines.  Previous problems you or members of your family have had with the use of anesthetics.  Any blood disorders you have.  Previous surgeries you have had. History of kidney problems or failure.  Other medical conditions you have.  RISKS AND COMPLICATIONS  Generally, a coronary angiogram is a safe procedure. However, about 1 person out of 1000 can have problems that may include: Allergic reaction to the dye. Bleeding/bruising from the access site or other locations. Kidney injury, especially in people with impaired kidney function. Stroke (rare). Heart attack (rare). Irregular rhythms (rare) Death (rare)  BEFORE THE PROCEDURE  Do not eat or drink anything after midnight the night before the procedure or as directed by your health care provider.  Ask your health care provider about changing or stopping your regular medicines. This is especially important if you are taking diabetes medicines or blood thinners.  PROCEDURE You may be given a medicine to help you relax (sedative) before the procedure. This medicine is given through an intravenous (IV) access tube that is inserted into one of your veins.  The area where the catheter will be inserted will be washed and shaved. This is  usually done in the groin but may be done in the fold of your arm (near your elbow) or in the wrist.  A medicine will be given to numb the area where the catheter will be inserted (local anesthetic).  The health care provider will insert the catheter into an artery. The catheter will be guided by using a special type of X-ray (fluoroscopy) of the blood vessel being examined.  A special dye will then be injected into the catheter, and X-rays  will be taken. The dye will help to show where any narrowing or blockages are located in the heart arteries.    AFTER THE PROCEDURE  If the procedure is done through the leg, you will be kept in bed lying flat for several hours. You will be instructed to not bend or cross your legs. The insertion site will be checked frequently.  The pulse in your feet or wrist will be checked frequently.  Additional blood tests, X-rays, and an electrocardiogram may be done.         If you need a refill on your cardiac medications before your next appointment, please call your pharmacy.   Call the [facility] office at [phone] if you have any questions, problems or concerns.

## 2017-09-15 NOTE — Patient Instructions (Signed)
We will try starting Spiriva Respimat to see if you benefit from this medication.  If it helps your breathing, wheezing or cough then please call our office and we will continue it, send a prescription to your pharmacy. Start taking your Singulair in the evening instead of in the morning. Continue Zyrtec every day Start using your fluticasone nasal spray, 2 sprays each nostril once a day during the allergy season. Keep albuterol available to use 2 puffs if needed for shortness of breath, wheezing, chest tightness. You may need to consider restarting your Protonix (pantoprazole) at some point to eliminate any contribution of reflux to your coughing. Please continue your Tessalon Perles as needed for cough suppression. We may revisit possible CPAP at some point in the future if we can get your cough better control. Follow with Dr Lamonte Sakai in 3 months or sooner if you have any problems.

## 2017-09-15 NOTE — Assessment & Plan Note (Signed)
She has not been able to tolerate CPAP to date.  We will try to revisit if we can get her cough under better control

## 2017-09-15 NOTE — Progress Notes (Addendum)
CARDIOLOGY OFFICE NOTE  Date:  09/24/2017    Destiny Ballard Date of Birth: 1951/04/12 Medical Record #[id number]  PCP:  Darreld Mclean, MD  Cardiologist:  Servando Snare     Chief Complaint  Patient presents with  . Chest Pain    Post Myoview     History of Present Illness: Destiny Ballard is a 68 y.o. female who presents today for a pre cath visit. Former patient of Dr. Susa Simmonds and Dr. Claris Gladden - has primarily followed with me.   She has amedical history significant of hypertension, hyperlipidemia, diabetes mellitus, GERD, anxiety, morbid obesity,&CAD, s/p stent placement 2007. Her last cath was in 2017 and was stable. She has had a tendency towards medication non compliance.   I saw her last week as a work in - her husband "[name]" died right after Christmas. Things are pretty hard for her. Lots of stress/sadness. Some chest pain reported. Short of breath. Got her Myoview updated - intermediate study. Now referring for cath.    Comes in today. Here with her daughter. Still quite sad. Saw pulmonary this morning - has had chronic cough for many years. Seems to get worse when she gets really upset. Still has some vague chest pain off and on. She has not been interested in any type of CV risk factor modification and has basically just been too sad. Diabetes is not controlled. Unclear about her medicines - sounds like several of her medicines have just been refilled and that she is getting back on them.   Past Medical History:  Diagnosis Date  . Anxiety    Prior suicide attempt  . CAD (coronary artery disease)    a) s/p DES to LAD 07/2005 b) Last Myoview low risk 11/2011 showing small fixed apical perfusion defect (prior MI vs attenuation) but no ischemia - normal EF.  [name] Cervical spondylosis   . Coronary atherosclerosis 06/28/2008  . Depression   . Depression with anxiety   . Diabetes mellitus without complication ([place])   . GERD (gastroesophageal reflux disease)   .  Hyperlipidemia   . Hypertension   . Insulin resistance   . Iron deficiency anemia   . Obesity     Past Surgical History:  Procedure Laterality Date  . BREAST ENHANCEMENT SURGERY    . CARDIAC CATHETERIZATION  06/17/2007   NORMAL. EF 60%  . CARDIAC CATHETERIZATION N/A 01/29/2016   Procedure: Left Heart Cath and Coronary Angiography;  Surgeon: Sherren Mocha, MD;  Location: [facility];  Service: Cardiovascular;  Laterality: N/A;  . CERVICAL SPONDYLOSIS     SINGLE LEVEL FUSION  . CHILDBIRTH     X3  . CORONARY STENT PLACEMENT  07/2005   LEFT ANTERIOR DESCENDING  . FOREARM FRACTURE SURGERY  2010   hand and shoulder   . INCISION AND DRAINAGE BREAST ABSCESS  01/05/2012      . INCISION AND DRAINAGE PERIRECTAL ABSCESS N/A 02/18/2014   Procedure: IRRIGATION AND DEBRIDEMENT PERIRECTAL ABSCESS;  Surgeon: Pedro Earls, MD;  Location: [facility];  Service: General;  Laterality: N/A;  . LUMBAR LAMINECTOMY    . ROTATOR CUFF REPAIR     bilaterla  . TONSILLECTOMY AND ADENOIDECTOMY    . TUBAL LIGATION    . VIDEO BRONCHOSCOPY Bilateral 08/13/2016   Procedure: VIDEO BRONCHOSCOPY WITHOUT FLUORO;  Surgeon: Collene Gobble, MD;  Location: [facility];  Service: Cardiopulmonary;  Laterality: Bilateral;     Medications: Current Meds  Medication Sig  . acetaminophen (TYLENOL)  650 MG CR tablet Take 1,300 mg by mouth every 8 (eight) hours as needed for pain.  [name] albuterol (PROAIR HFA) 108 (90 Base) MCG/ACT inhaler Inhale 2 puffs into the lungs every 4 (four) hours as needed for wheezing or shortness of breath.  [name] aspirin EC 81 MG tablet Take 81 mg by mouth daily.  . benzonatate (TESSALON) 100 MG capsule Take 1 capsule (100 mg total) by mouth 3 (three) times daily as needed for cough.  . calcium carbonate (TUMS - DOSED IN MG ELEMENTAL CALCIUM) 500 MG chewable tablet Chew 2 tablets by mouth daily as needed for indigestion or heartburn.  . cetirizine (ZYRTEC) 10 MG tablet TAKE 10 MG BY MOUTH DAILY  .  Cholecalciferol (VITAMIN D3) 1000 UNITS CAPS Take 1,000 Units by mouth daily.   . clonazePAM (KLONOPIN) 0.5 MG tablet TAKE 0.5 TABLETS (0.25 MG TOTAL) BY MOUTH 2 TIMES DAILY AS NEEDED FOR ANXIETY (Patient taking differently: TAKE 0.5 MG BY MOUTH 2 TIMES DAILY AS NEEDED FOR ANXIETY)  . CRESTOR 20 MG tablet TAKE 1 TABLET BY MOUTH EVERY DAY (Patient taking differently: TAKE 20 MG BY MOUTH EVERY DAY)  . diphenhydramine-acetaminophen (TYLENOL PM) 25-500 MG TABS Take 2 tablets by mouth at bedtime as needed (for sleep).   [name] FLUoxetine (PROZAC) 40 MG capsule Take 1 capsule (40 mg total) by mouth daily.  . fluticasone (FLONASE) 50 MCG/ACT nasal spray Place 2 sprays into both nostrils daily. (Patient taking differently: Place 2 sprays into both nostrils daily as needed for allergies. )  . furosemide (LASIX) 20 MG tablet TAKE ONE TABLET DAILY IF NEEDED FOR LEG SWELLING (Patient taking differently: TAKE 20 MG BY MOUTH DAILY)  . gabapentin (NEURONTIN) 300 MG capsule Take 600 mg by mouth 2 (two) times daily.   . hydrALAZINE (APRESOLINE) 25 MG tablet Take 2 tablets (50 mg total) by mouth 3 (three) times daily.  . hydrOXYzine (ATARAX/VISTARIL) 25 MG tablet Take 1 tablet (25 mg total) by mouth at bedtime.  [name] ibuprofen (ADVIL,MOTRIN) 200 MG tablet Take 800 mg by mouth every 6 (six) hours as needed for headache or moderate pain.   . Insulin Detemir (LEVEMIR FLEXTOUCH) 100 UNIT/ML Pen Inject 10 Units into the skin daily.  [name] losartan (COZAAR) 100 MG tablet Take 1 tablet (100 mg total) by mouth daily.  [name] lovastatin (MEVACOR) 20 MG tablet Take 1 tablet (20 mg total) by mouth at bedtime.  . metFORMIN (GLUCOPHAGE-XR) 500 MG 24 hr tablet Take 2 tablets (1,000 mg total) by mouth 2 (two) times daily.  . methocarbamol (ROBAXIN) 750 MG tablet TAKE 1 TABLET BY MOUTH EVERY 12 HOURS AS NEEDED FOR MUSCLE SPASMS (Patient taking differently: Take 750 mg by mouth every 12 (twelve) hours as needed for muscle spasms. )  . metoprolol  succinate (TOPROL-XL) 100 MG 24 hr tablet Take 1 tablet (100 mg total) by mouth daily.  . montelukast (SINGULAIR) 10 MG tablet Take 1 tablet (10 mg total) by mouth at bedtime.  . nitroGLYCERIN (NITROSTAT) 0.4 MG SL tablet Place 1 tablet (0.4 mg total) under the tongue every 5 (five) minutes as needed for chest pain.  [name] omega-3 acid ethyl esters (LOVAZA) 1 g capsule Take 1 capsule (1 g total) by mouth 2 (two) times daily.  . potassium chloride (K-DUR) 10 MEQ tablet Take 1 tablet (10 mEq total) by mouth daily. Take only on days that you use lasix/ furosemide (Patient taking differently: Take 10 mEq by mouth every evening. )  . repaglinide (PRANDIN) 2  MG tablet Take 1 tablet (2 mg total) by mouth 3 (three) times daily before meals.  . Tiotropium Bromide Monohydrate (SPIRIVA RESPIMAT) 2.5 MCG/ACT AERS Inhale 2 puffs into the lungs daily.  . [DISCONTINUED] Continuous Blood Gluc Receiver (FREESTYLE LIBRE READER) DEVI 1 Device by Does not apply route 2 (two) times daily. (Patient not taking: Reported on 09/16/2017)  . [DISCONTINUED] Continuous Blood Gluc Sensor ([place]) MISC 3 each by Does not apply route 2 (two) times daily. (Patient not taking: Reported on 09/16/2017)  . [DISCONTINUED] Insulin Pen Needle 31G X 5 MM MISC Use to inject insulin as instructed once a day (Patient not taking: Reported on 09/16/2017)     Allergies: Allergies  Allergen Reactions  . Prednisone Other (See Comments)    REACTION: mood swings, nightmares. "Shot doesn't bother me, reaction is just with the pill" she states she has had the steroid injections before. From our records methylprednisone was given to her in 2013 without any complications.    Social History: The patient  reports that  has never smoked. she has never used smokeless tobacco. She reports that she drinks alcohol. She reports that she does not use drugs.   Family History: The patient's family history includes Allergies in her other; Asthma  in her daughter and mother; Cancer in her daughter and sister; Cervical cancer in her daughter; Diabetes in her mother and sister; Heart attack in her mother; Heart disease in her mother; Lung cancer in her mother; Suicidality in her father.   Review of Systems: Please see the history of present illness.   Otherwise, the review of systems is positive for none.   All other systems are reviewed and negative.   Physical Exam: VS:  BP (!) 150/70 (BP Location: Left Arm, Patient Position: Sitting, Cuff Size: Normal)   Pulse 94   Ht 5' (1.524 m)   Wt 180 lb (81.6 kg)   SpO2 97%   BMI 35.15 kg/m  .  BMI Body mass index is 35.15 kg/m.  Wt Readings from Last 3 Encounters:  09/21/17 181 lb 7 oz (82.3 kg)  09/15/17 180 lb (81.6 kg)  09/15/17 178 lb (80.7 kg)    General: Alert and in no acute distress.  She is pretty tearful but not as bad as she was last week. High stridor cough. Obese.  HEENT: Normal.  Neck: Supple, no JVD, carotid bruits, or masses noted.  Cardiac: Regular rate and rhythm. No murmurs, rubs, or gallops. No edema.  Respiratory:  Lungs are clear to auscultation bilaterally with normal work of breathing.  GI: Soft and nontender.  MS: No deformity or atrophy. Gait and ROM intact.  Skin: Warm and dry. Color is normal.  Neuro:  Strength and sensation are intact and no gross focal deficits noted.  Psych: Alert, appropriate and with normal affect.   LABORATORY DATA:  EKG:  EKG is not ordered today.  Lab Results  Component Value Date   WBC 11.0 (H) 09/22/2017   HGB 10.3 (L) 09/22/2017   HCT 32.9 (L) 09/22/2017   PLT 211 09/22/2017   GLUCOSE 329 (H) 09/21/2017   CHOL 239 (H) 04/03/2017   TRIG 389.0 (H) 04/03/2017   HDL 52.20 04/03/2017   LDLDIRECT 100.0 04/03/2017   LDLCALC UNABLE TO CALCULATE IF TRIGLYCERIDE OVER 400 mg/dL 01/27/2016   ALT 18 09/17/2017   AST 31 09/17/2017   NA 137 09/21/2017   K 4.3 09/21/2017   CL 96 (L) 09/21/2017   CREATININE 0.77 09/21/2017  BUN 22 (H) 09/21/2017   CO2 31 09/21/2017   TSH 2.361 01/27/2016   INR 0.9 09/15/2017   HGBA1C 8.2 (H) 09/01/2017   MICROALBUR <0.7 07/24/2016     BNP (last 3 results) Recent Labs    09/18/17 0348  BNP 45.7    ProBNP (last 3 results) No results for input(s): PROBNP in the last 8760 hours.   Other Studies Reviewed Today:  Myoview Study Highlights 08/2017    Nuclear stress EF: 64%.  There was no ST segment deviation noted during stress.  Findings consistent with ischemia.  This is an intermediate risk study.  The left ventricular ejection fraction is normal (55-65%).   1. EF 64%, normal wall motion.  2. Partially reversible medium-sized, moderate intensity mid to apical anteroseptal and apical anterior perfusion defect.  This is concerning for ischemia.   Intermediate risk study.      Echo Study Conclusions 08/2017  - Left ventricle: The cavity size was normal. There was moderate concentric hypertrophy. Systolic function was normal. The estimated ejection fraction was in the range of 60% to 65%. Wall motion was normal; there were no regional wall motion abnormalities. There was an increased relative contribution of atrial contraction to ventricular filling. Doppler parameters are consistent with abnormal left ventricular relaxation (grade 1 diastolic dysfunction). - Aortic valve: Valve area (VTI): 1.97 cm^2. Valve area (Vmax): 1.67 cm^2. Valve area (Vmean): 1.71 cm^2. - Left atrium: The atrium was mildly dilated. - Pulmonary arteries: Systolic pressure could not be accurately estimated.     Heart cath: 01/29/16 1. Continued patency of the LAD stent 2. Mild nonobstructive CAD  3. Normal LV function by echo assessment  Assessment/Plan:  1. Chest pain and CAD:  s/p of stent 2007.Stable cardiac cath findings from 2017. Now with intermediate stress test and somewhat atypical chest pain. She has continued to have chest pain -  unclear how much stress is playing a role. Have recommended cardiac cath - The patient understands that risks include but are not limited to stroke (1 in 1000), death (1 in 1000), kidney failure [usually temporary] (1 in 500), bleeding (1 in 200), allergic reaction [possibly serious] (1 in 200), and agrees to proceed. Arranged for Thursday with Dr. Francoise Schaumann on Thursday. Hold Glucophage tomorrow and Wednesday. Hold other diabetic medicines that morning.   2. HTN:BP some better - I am still not convinced that she is taking her medicines correctly. Will have her bring her bottles back in for recheck on return.   3. GERD: -will continue Protonix  4. DM-II:uncontrolled  5. HLD with hypertriglyceridemia:most likely from poor diabetic control as well.   6. Chronic cough and chronic mild SOB: saw pulmonary today - getting new inhaler.   7. Situational stress/grief - very sad situation. She is truly heartbroken.   Current medicines are reviewed with the patient today.  The patient does not have concerns regarding medicines other than what has been noted above.  The following changes have been made:  See above.  Labs/ tests ordered today include:    Orders Placed This Encounter  Procedures  . Basic metabolic panel  . CBC  . PT and PTT     Disposition:   FU with me in about 4 to 6 weeks for a recheck.   Patient is agreeable to this plan and will call if any problems develop in the interim.   SignedTruitt Merle, NP  09/24/2017 8:41 AM  [address]  [place], [place]  [address] Phone: [phone] Fax: [phone]       Addendum: 09/24/17  Noted on pre cath lab to have slightly elevated WBC - she was subsequently found to have pneumonia - required hospital admission. Cardiac cath was postponed until later this month.   Burtis Junes, RN, [facility] [address] [place], [place]  [address] [phone]

## 2017-09-15 NOTE — Progress Notes (Signed)
Subjective:    Patient ID: Destiny Ballard, female    DOB: July 28, 1950, 67 y.o.   MRN: [id number]  HPI 67 year old woman, never smoker, with a history of coronary disease, depression, diabetes, GERD, allergic rhinitis. She's been seen in our office previously for evaluation of possible obstructive sleep apnea - never had her PSG. She is also been seen for chronic coughing. She had pulmonary function testing in June 2014 that I have personally reviewed and were consistent with either restriction or mixed disease.  She continues to have every day cough, non-productive.  Continues to have some GERD, is on protonix, still takes Tums. She is on allegra and zyrtec. She is referred back today following recent hospitalization to further eval her dyspnea, possible OSA and cough.   ROV 04/25/16 -- This is a follow-up visit for chronic cough as well as obstructive sleep apnea. She underwent a sleep study on 02/20/16 that I personally reviewed. This showed evidence of severe obstructive sleep apnea, AHI 35.5 per hour. Follow-up titration study on 8/24 showed that she benefited from CPAP at 13 cm of water. Working with APS to get CPAP. Underwent pulmonary function testing today to compare with priors, I have reviewed > shows significant mixed disease. She has been on SABA before, not currently.  Cough persists, about the same. Relies on tessalon prn. Remains on allegra qd, protonix.   ROV 08/07/16 -- Patient is a never smoker who follows up for her history of chronic cough in the setting of rhinitis and GERD. She also has obstructive sleep apnea. Pulmonary function testing performed in October showed mixed obstruction and restriction. Last time we attempted to start CPAP > she has not tolerated due to cough, kept her from being able to keep it on. She continues to have chronic cough. She remains on protonix, has some breakthrough GERD sx. She has been off allegra about 1 month ago. She is on zyrtec qd, singulair qpm.  She has tried albuterol prn, may have benefited some.   ROV 09/15/17 --67 year old woman, never smoker with a history of chronic cough in the setting of rhinitis and GERD.  She also has mixed obstruction and restriction on pulmonary function testing.  Finally she has obstructive sleep apnea.  We have attempted to get her on CPAP but she has been unable to tolerate to date, often due to cough.  She was formally on Protonix, not currently. She does use tums prn. Sleeps with her head up.  Feels that her reflux has been doing pretty well. She does take Singulair qam and fluticasone nasal spray as needed, zyrtec daily. She uses albuterol prn, describes increased wheeze last two weeks with increased albuterol use. She uses benzonatate every day.  Unfortunately her husband died recently and this is been very stressful, emotional.  She gets tearful which increases her drainage to her throat, seems to impact her cough.   Review of Systems As per HPI  Past Medical History:  Diagnosis Date  . Anxiety    Prior suicide attempt  . CAD (coronary artery disease)    a) s/p DES to LAD 07/2005 b) Last Myoview low risk 11/2011 showing small fixed apical perfusion defect (prior MI vs attenuation) but no ischemia - normal EF.  [name] Cervical spondylosis   . Coronary atherosclerosis 06/28/2008  . Depression   . Depression with anxiety   . Diabetes mellitus without complication ([place])   . GERD (gastroesophageal reflux disease)   . Hyperlipidemia   . Hypertension   .  Insulin resistance   . Iron deficiency anemia   . Obesity      Family History  Problem Relation Age of Onset  . Heart attack Mother   . Diabetes Mother   . Lung cancer Mother   . Asthma Mother   . Heart disease Mother   . Suicidality Father        "killed himself"  . Asthma Daughter        x 2  . Cancer Daughter        pre-cancerous polyp  . Diabetes Sister   . Cancer Sister   . Cervical cancer Daughter        cervical   . Allergies Other         all family--seasonal allergies     Social History   Socioeconomic History  . Marital status: Married    Spouse name: Lake Bells  . Number of children: 3  . Years of education: 52  . Highest education level: Not on file  Social Needs  . Financial resource strain: Not on file  . Food insecurity - worry: Not on file  . Food insecurity - inability: Not on file  . Transportation needs - medical: Not on file  . Transportation needs - non-medical: Not on file  Occupational History  . Occupation: retired from [employer]: 11/2012  Tobacco Use  . Smoking status: Never Smoker  . Smokeless tobacco: Never Used  Substance and Sexual Activity  . Alcohol use: Yes    Comment: occ  . Drug use: No  . Sexual activity: Not Currently    Partners: Male    Birth control/protection: None  Other Topics Concern  . Not on file  Social History Narrative   Lives with her husband.  Their eldest daughter lives upstairs.     Allergies  Allergen Reactions  . Prednisone Other (See Comments)    REACTION: mood swings, nightmares. "Shot doesn't bother me, reaction is just with the pill" she states she has had the steroid injections before. From our records methylprednisone was given to her in 2013 without any complications.     Outpatient Medications Prior to Visit  Medication Sig Dispense Refill  . acetaminophen (TYLENOL) 650 MG CR tablet Take 1,300 mg by mouth every 8 (eight) hours as needed for pain.    [name] aspirin EC 81 MG tablet Take 81 mg by mouth daily.    . benzonatate (TESSALON) 100 MG capsule Take 1 capsule (100 mg total) by mouth 3 (three) times daily as needed for cough. 40 capsule 3  . calcium carbonate (TUMS - DOSED IN MG ELEMENTAL CALCIUM) 500 MG chewable tablet Chew 2 tablets by mouth daily as needed for indigestion or heartburn.    . cetirizine (ZYRTEC) 10 MG tablet TAKE 1 TABLET BY MOUTH DAILY AS NEEDED FOR ALLERGIES  3  . Cholecalciferol (VITAMIN D3) 1000 UNITS CAPS Take 1,000 Units by  mouth daily.     . clonazePAM (KLONOPIN) 0.5 MG tablet TAKE 0.5 TABLETS (0.25 MG TOTAL) BY MOUTH 2 TIMES DAILY AS NEEDED FOR ANXIETY 20 tablet 1  . Continuous Blood Gluc Receiver (FREESTYLE LIBRE READER) DEVI 1 Device by Does not apply route 2 (two) times daily. 1 Device 0  . Continuous Blood Gluc Sensor (FREESTYLE LIBRE SENSOR SYSTEM) MISC 3 each by Does not apply route 2 (two) times daily. 3 each 3  . CRESTOR 20 MG tablet TAKE 1 TABLET BY MOUTH EVERY DAY 90 tablet 3  . diphenhydramine-acetaminophen (  TYLENOL PM) 25-500 MG TABS Take 2 tablets by mouth at bedtime.     [name] FLUoxetine (PROZAC) 40 MG capsule Take 1 capsule (40 mg total) by mouth daily. 90 capsule 1  . furosemide (LASIX) 20 MG tablet TAKE ONE TABLET DAILY IF NEEDED FOR LEG SWELLING 30 tablet 1  . gabapentin (NEURONTIN) 300 MG capsule Take 300 mg by mouth 2 (two) times daily.    . hydrALAZINE (APRESOLINE) 25 MG tablet Take 2 tablets (50 mg total) by mouth 3 (three) times daily. (Patient taking differently: Take 50 mg by mouth 2 (two) times daily. ) 180 tablet 11  . hydrOXYzine (ATARAX/VISTARIL) 25 MG tablet Take 1 tablet (25 mg total) by mouth at bedtime. 90 tablet 3  . ibuprofen (ADVIL,MOTRIN) 200 MG tablet Take 800 mg by mouth daily as needed for moderate pain.    . Insulin Detemir (LEVEMIR FLEXTOUCH) 100 UNIT/ML Pen Inject 10 Units into the skin daily. (Patient taking differently: Inject 18 Units into the skin daily. ) 15 mL 3  . Insulin Pen Needle 31G X 5 MM MISC Use to inject insulin as instructed once a day 100 each 1  . losartan (COZAAR) 100 MG tablet Take 1 tablet (100 mg total) by mouth daily. 90 tablet 3  . lovastatin (MEVACOR) 20 MG tablet Take 1 tablet (20 mg total) by mouth at bedtime. 90 tablet 3  . metFORMIN (GLUCOPHAGE-XR) 500 MG 24 hr tablet Take 2 tablets (1,000 mg total) by mouth 2 (two) times daily. 360 tablet 0  . methocarbamol (ROBAXIN) 750 MG tablet TAKE 1 TABLET BY MOUTH EVERY 12 HOURS AS NEEDED FOR MUSCLE SPASMS 60  tablet 0  . metoprolol succinate (TOPROL-XL) 100 MG 24 hr tablet Take 1 tablet (100 mg total) by mouth daily. 90 tablet 3  . nitroGLYCERIN (NITROSTAT) 0.4 MG SL tablet Place 1 tablet (0.4 mg total) under the tongue every 5 (five) minutes as needed for chest pain. 20 tablet 3  . omega-3 acid ethyl esters (LOVAZA) 1 g capsule Take 1 capsule (1 g total) by mouth 2 (two) times daily. 60 capsule 5  . potassium chloride (K-DUR) 10 MEQ tablet Take 1 tablet (10 mEq total) by mouth daily. Take only on days that you use lasix/ furosemide 30 tablet 3  . repaglinide (PRANDIN) 2 MG tablet Take 1 tablet (2 mg total) by mouth 3 (three) times daily before meals. 90 tablet 11  . albuterol (PROAIR HFA) 108 (90 Base) MCG/ACT inhaler Inhale 2 puffs into the lungs every 4 (four) hours as needed for wheezing or shortness of breath.    . fluticasone (FLONASE) 50 MCG/ACT nasal spray Place 2 sprays into both nostrils daily. 16 g 5  . montelukast (SINGULAIR) 10 MG tablet TAKE 1 TABLET (10 MG TOTAL) BY MOUTH AT BEDTIME. 90 tablet 3  . amLODipine (NORVASC) 10 MG tablet TAKE 1 TABLET BY MOUTH EVERY DAY 90 tablet 1  . bromocriptine (PARLODEL) 2.5 MG tablet TAKE 1/2 TABLET (1.25 MG TOTAL) BY MOUTH AT BEDTIME. 45 tablet 1  . meclizine (ANTIVERT) 12.5 MG tablet Take 12.5 mg by mouth 3 (three) times daily as needed for dizziness.    . meloxicam (MOBIC) 15 MG tablet Take 15 mg by mouth daily.    . pantoprazole (PROTONIX) 40 MG tablet Take 1 tablet (40 mg total) by mouth daily. 30 tablet 3   No facility-administered medications prior to visit.         Objective:   Physical Exam Vitals:   09/15/17  1143  BP: 138/90  Pulse: 64  SpO2: 94%  Weight: 178 lb (80.7 kg)  Height: 5' (1.524 m)   Gen: Pleasant, overwt woman, in no distress,  normal affect  ENT: No lesions,  mouth clear,  oropharynx clear, no postnasal drip, M4 airway  Neck: No JVD, no stridor  Lungs: No use of accessory muscles, clear without rales or  rhonchi  Cardiovascular: RRR, heart sounds normal, no murmur or gallops, no peripheral edema  Musculoskeletal: No deformities, no cyanosis or clubbing  Neuro: alert, non focal  Skin: Warm, no lesions or rashes       Assessment & Plan:  Obstructive lung disease (generalized) (HCC) Mixed disease by pulmonary function testing.  She does seem to get some clinical benefit from albuterol.  We will do a trial of scheduled bronchodilator, Spiriva Respimat to see if she benefits.  Continue to keep albuterol available to use as needed  OSA (obstructive sleep apnea) She has not been able to tolerate CPAP to date.  We will try to revisit if we can get her cough under better control  Chronic rhinitis Currently using Singulair, Zyrtec.  She has fluticasone nasal spray available.  I will ask her to start taking this every day during the allergy season.  Change her Singulair timing to the evening.  GERD Less bothersome than on previous visits.  She does still sometimes use Tums depending on what she is eating.  We may need to restart a PPI at some point if we believe that this is contributing to the chronic cough.  Chronic cough Largely unchanged.  Contributors include GERD that is not currently treated (although few symptoms), chronic rhinitis, untreated sleep apnea with potential upper airway impact, suspected COPD which is currently untreated.  She is also on losartan which is sometimes been affiliated with cough.  We could consider change sometime in the future depending on her progress.  Baltazar Apo, MD, PhD 09/15/2017, 12:24 PM [facility] [phone] or if no answer [phone]

## 2017-09-16 ENCOUNTER — Telehealth: Payer: Self-pay | Admitting: *Deleted

## 2017-09-16 LAB — CBC
Hematocrit: 38.6 % (ref 34.0–46.6)
Hemoglobin: 12.5 g/dL (ref 11.1–15.9)
MCH: 25.8 pg — ABNORMAL LOW (ref 26.6–33.0)
MCHC: 32.4 g/dL (ref 31.5–35.7)
MCV: 80 fL (ref 79–97)
Platelets: 345 10*3/uL (ref 150–379)
RBC: 4.84 x10E6/uL (ref 3.77–5.28)
RDW: 14.4 % (ref 12.3–15.4)
WBC: 14.3 10*3/uL — ABNORMAL HIGH (ref 3.4–10.8)

## 2017-09-16 LAB — BASIC METABOLIC PANEL
BUN/Creatinine Ratio: 19 (ref 12–28)
BUN: 14 mg/dL (ref 8–27)
CO2: 25 mmol/L (ref 20–29)
Calcium: 9.5 mg/dL (ref 8.7–10.3)
Chloride: 97 mmol/L (ref 96–106)
Creatinine, Ser: 0.72 mg/dL (ref 0.57–1.00)
GFR calc Af Amer: 100 mL/min/{1.73_m2} (ref >59)
GFR calc non Af Amer: 86 mL/min/{1.73_m2} (ref >59)
Glucose: 184 mg/dL — ABNORMAL HIGH (ref 65–99)
Potassium: 4.2 mmol/L (ref 3.5–5.2)
Sodium: 139 mmol/L (ref 134–144)

## 2017-09-16 LAB — PT AND PTT
INR: 0.9 (ref 0.8–1.2)
Prothrombin Time: 9.7 s (ref 9.1–12.0)
aPTT: 27 s (ref 24–33)

## 2017-09-16 NOTE — Telephone Encounter (Signed)
Pt contacted pre-catheterization scheduled at [facility] for: Thursday September 18, 2017 1:30 PM Verified arrival time and place: [place] [facility] at: 11:30 AM Nothing to eat or drink after midnight prior to cath.  Hold: No metformin 09/17/17, 09/18/17 and 48 hours after cath No Insulin AM of cath No prandin AM of cath No furosemide AM of cath  Except hold medications AM meds can be  taken pre-cath with sip of water including: ASA 81 mg   Confirmed patient has responsible person to drive home post procedure and observe patient for 24 hours: yes  White count noted to be elevated on lab done yesterday, I confirm with patient that she does not have a fever, she has a cough that she says has been chronic for some time.

## 2017-09-16 NOTE — Telephone Encounter (Signed)
LMTCB for pt to discuss instructions for cath scheduled for 09/18/17

## 2017-09-17 ENCOUNTER — Inpatient Hospital Stay (HOSPITAL_BASED_OUTPATIENT_CLINIC_OR_DEPARTMENT_OTHER)
Admission: EM | Admit: 2017-09-17 | Discharge: 2017-09-22 | DRG: 871 | Disposition: A | Discharge status: Home / Self Care | Payer: Medicare Other | Attending: Internal Medicine | Admitting: Internal Medicine

## 2017-09-17 ENCOUNTER — Other Ambulatory Visit: Payer: Self-pay

## 2017-09-17 ENCOUNTER — Telehealth: Payer: Self-pay | Admitting: Nurse Practitioner

## 2017-09-17 ENCOUNTER — Emergency Department (HOSPITAL_BASED_OUTPATIENT_CLINIC_OR_DEPARTMENT_OTHER): Payer: Medicare Other

## 2017-09-17 ENCOUNTER — Encounter (HOSPITAL_BASED_OUTPATIENT_CLINIC_OR_DEPARTMENT_OTHER): Payer: Self-pay

## 2017-09-17 DIAGNOSIS — J9601 Acute respiratory failure with hypoxia: Secondary | ICD-10-CM | POA: Diagnosis present

## 2017-09-17 DIAGNOSIS — F418 Other specified anxiety disorders: Secondary | ICD-10-CM | POA: Diagnosis not present

## 2017-09-17 DIAGNOSIS — J441 Chronic obstructive pulmonary disease with (acute) exacerbation: Secondary | ICD-10-CM | POA: Diagnosis present

## 2017-09-17 DIAGNOSIS — J181 Lobar pneumonia, unspecified organism: Secondary | ICD-10-CM | POA: Diagnosis present

## 2017-09-17 DIAGNOSIS — I110 Hypertensive heart disease with heart failure: Secondary | ICD-10-CM | POA: Diagnosis present

## 2017-09-17 DIAGNOSIS — J189 Pneumonia, unspecified organism: Secondary | ICD-10-CM

## 2017-09-17 DIAGNOSIS — Z833 Family history of diabetes mellitus: Secondary | ICD-10-CM | POA: Diagnosis not present

## 2017-09-17 DIAGNOSIS — Z915 Personal history of self-harm: Secondary | ICD-10-CM | POA: Diagnosis not present

## 2017-09-17 DIAGNOSIS — Z79899 Other long term (current) drug therapy: Secondary | ICD-10-CM | POA: Diagnosis not present

## 2017-09-17 DIAGNOSIS — R05 Cough: Secondary | ICD-10-CM | POA: Diagnosis not present

## 2017-09-17 DIAGNOSIS — J440 Chronic obstructive pulmonary disease with acute lower respiratory infection: Secondary | ICD-10-CM | POA: Diagnosis not present

## 2017-09-17 DIAGNOSIS — I5032 Chronic diastolic (congestive) heart failure: Secondary | ICD-10-CM | POA: Diagnosis not present

## 2017-09-17 DIAGNOSIS — I152 Hypertension secondary to endocrine disorders: Secondary | ICD-10-CM | POA: Diagnosis present

## 2017-09-17 DIAGNOSIS — E1142 Type 2 diabetes mellitus with diabetic polyneuropathy: Secondary | ICD-10-CM | POA: Diagnosis not present

## 2017-09-17 DIAGNOSIS — E1165 Type 2 diabetes mellitus with hyperglycemia: Secondary | ICD-10-CM | POA: Diagnosis not present

## 2017-09-17 DIAGNOSIS — Z7982 Long term (current) use of aspirin: Secondary | ICD-10-CM | POA: Diagnosis not present

## 2017-09-17 DIAGNOSIS — A419 Sepsis, unspecified organism: Secondary | ICD-10-CM | POA: Diagnosis present

## 2017-09-17 DIAGNOSIS — R6520 Severe sepsis without septic shock: Secondary | ICD-10-CM | POA: Diagnosis present

## 2017-09-17 DIAGNOSIS — E119 Type 2 diabetes mellitus without complications: Secondary | ICD-10-CM

## 2017-09-17 DIAGNOSIS — Z7984 Long term (current) use of oral hypoglycemic drugs: Secondary | ICD-10-CM

## 2017-09-17 DIAGNOSIS — K219 Gastro-esophageal reflux disease without esophagitis: Secondary | ICD-10-CM | POA: Diagnosis not present

## 2017-09-17 DIAGNOSIS — Z7951 Long term (current) use of inhaled steroids: Secondary | ICD-10-CM

## 2017-09-17 DIAGNOSIS — I2510 Atherosclerotic heart disease of native coronary artery without angina pectoris: Secondary | ICD-10-CM | POA: Diagnosis not present

## 2017-09-17 DIAGNOSIS — E785 Hyperlipidemia, unspecified: Secondary | ICD-10-CM | POA: Diagnosis present

## 2017-09-17 DIAGNOSIS — G4733 Obstructive sleep apnea (adult) (pediatric): Secondary | ICD-10-CM | POA: Diagnosis not present

## 2017-09-17 DIAGNOSIS — E1159 Type 2 diabetes mellitus with other circulatory complications: Secondary | ICD-10-CM | POA: Diagnosis present

## 2017-09-17 DIAGNOSIS — Z8249 Family history of ischemic heart disease and other diseases of the circulatory system: Secondary | ICD-10-CM | POA: Diagnosis not present

## 2017-09-17 DIAGNOSIS — R000 Tachycardia, unspecified: Secondary | ICD-10-CM | POA: Diagnosis not present

## 2017-09-17 DIAGNOSIS — R0602 Shortness of breath: Secondary | ICD-10-CM | POA: Diagnosis not present

## 2017-09-17 DIAGNOSIS — I10 Essential (primary) hypertension: Secondary | ICD-10-CM | POA: Diagnosis present

## 2017-09-17 DIAGNOSIS — J9691 Respiratory failure, unspecified with hypoxia: Secondary | ICD-10-CM | POA: Diagnosis present

## 2017-09-17 LAB — CBC WITH DIFFERENTIAL/PLATELET
Basophils Absolute: 0 10*3/uL (ref 0.0–0.1)
Basophils Relative: 0 %
Eosinophils Absolute: 0.1 10*3/uL (ref 0.0–0.7)
Eosinophils Relative: 1 %
HCT: 36 % (ref 36.0–46.0)
Hemoglobin: 11.5 g/dL — ABNORMAL LOW (ref 12.0–15.0)
Lymphocytes Relative: 16 %
Lymphs Abs: 1.7 10*3/uL (ref 0.7–4.0)
MCH: 25.6 pg — ABNORMAL LOW (ref 26.0–34.0)
MCHC: 31.9 g/dL (ref 30.0–36.0)
MCV: 80 fL (ref 78.0–100.0)
Monocytes Absolute: 0.8 10*3/uL (ref 0.1–1.0)
Monocytes Relative: 7 %
Neutro Abs: 7.8 10*3/uL — ABNORMAL HIGH (ref 1.7–7.7)
Neutrophils Relative %: 76 %
Platelets: 239 10*3/uL (ref 150–400)
RBC: 4.5 MIL/uL (ref 3.87–5.11)
RDW: 14.3 % (ref 11.5–15.5)
WBC: 10.4 10*3/uL (ref 4.0–10.5)

## 2017-09-17 LAB — INFLUENZA PANEL BY PCR (TYPE A & B)
Influenza A By PCR: NEGATIVE
Influenza B By PCR: NEGATIVE

## 2017-09-17 LAB — COMPREHENSIVE METABOLIC PANEL WITH GFR
ALT: 18 U/L (ref 14–54)
AST: 31 U/L (ref 15–41)
Albumin: 3.3 g/dL — ABNORMAL LOW (ref 3.5–5.0)
Alkaline Phosphatase: 94 U/L (ref 38–126)
Anion gap: 10 (ref 5–15)
BUN: 12 mg/dL (ref 6–20)
CO2: 28 mmol/L (ref 22–32)
Calcium: 8.8 mg/dL — ABNORMAL LOW (ref 8.9–10.3)
Chloride: 95 mmol/L — ABNORMAL LOW (ref 101–111)
Creatinine, Ser: 0.73 mg/dL (ref 0.44–1.00)
GFR calc Af Amer: >60 mL/min
GFR calc non Af Amer: >60 mL/min
Glucose, Bld: 212 mg/dL — ABNORMAL HIGH (ref 65–99)
Potassium: 3.5 mmol/L (ref 3.5–5.1)
Sodium: 133 mmol/L — ABNORMAL LOW (ref 135–145)
Total Bilirubin: 0.4 mg/dL (ref 0.3–1.2)
Total Protein: 7.4 g/dL (ref 6.5–8.1)

## 2017-09-17 LAB — TROPONIN I: Troponin I: <0.03 ng/mL (ref <0.03)

## 2017-09-17 LAB — I-STAT CG4 LACTIC ACID, ED
Lactic Acid, Venous: 2.53 mmol/L (ref 0.5–1.9)
Lactic Acid, Venous: 1.25 mmol/L (ref 0.5–1.9)

## 2017-09-17 MED ORDER — AZITHROMYCIN 500 MG IV SOLR
INTRAVENOUS | Status: AC
  Filled 2017-09-17: qty 500

## 2017-09-17 MED ORDER — SODIUM CHLORIDE 0.9 % IV SOLN
500.0000 mg | INTRAVENOUS | Status: DC
  Administered 2017-09-17 – 2017-09-19 (×3): 500 mg via INTRAVENOUS
  Filled 2017-09-17 (×4): qty 500

## 2017-09-17 MED ORDER — SODIUM CHLORIDE 0.9 % IV BOLUS (SEPSIS)
1000.0000 mL | Freq: Once | INTRAVENOUS | Status: AC
  Administered 2017-09-17: 1000 mL via INTRAVENOUS

## 2017-09-17 MED ORDER — SODIUM CHLORIDE 0.9 % IV SOLN
1.0000 g | INTRAVENOUS | Status: DC
  Administered 2017-09-17 – 2017-09-19 (×3): 1 g via INTRAVENOUS
  Filled 2017-09-17 (×4): qty 10

## 2017-09-17 MED ORDER — ACETAMINOPHEN 500 MG PO TABS
1000.0000 mg | ORAL_TABLET | Freq: Once | ORAL | Status: AC
  Administered 2017-09-17: 1000 mg via ORAL
  Filled 2017-09-17: qty 2

## 2017-09-17 MED ORDER — IBUPROFEN 800 MG PO TABS
800.0000 mg | ORAL_TABLET | Freq: Once | ORAL | Status: AC
  Administered 2017-09-17: 800 mg via ORAL
  Filled 2017-09-17: qty 1

## 2017-09-17 MED ORDER — ONDANSETRON HCL 4 MG/2ML IJ SOLN
4.0000 mg | Freq: Once | INTRAMUSCULAR | Status: AC
  Administered 2017-09-17: 4 mg via INTRAVENOUS
  Filled 2017-09-17: qty 2

## 2017-09-17 MED ORDER — SODIUM CHLORIDE 0.9 % IV BOLUS (SEPSIS): 1000.0000 mL | Freq: Once | INTRAVENOUS | Status: DC

## 2017-09-17 NOTE — ED Notes (Signed)
EKG done

## 2017-09-17 NOTE — ED Provider Notes (Signed)
[facility] EMERGENCY DEPARTMENT Provider Note   CSN: [id number] Arrival date & time: 09/17/17  1633     History   Chief Complaint Chief Complaint  Patient presents with  . Cough    HPI Destiny Ballard is a 67 y.o. female.  HPI   Destiny Ballard is a 67yo female with a history of chronic cough, CAD, HTN, HLD, T2DM, obesity, anxiety, GERD who presents to the Emergency Department for evaluation of cough and fever.  States that she was told to come to the emergency department by her cardiologist.  She was scheduled to have a cardiac catheterization tomorrow for intermittent chest pain.  Reports that she has a chronic dry cough which is been ongoing for years now, been seen by cardiology who believe it is related to her GERD and allergic rhinitis.  States that over the past week her cough has been worse and she has associated shortness of breath.  States that cough sounds wet, although she has not been able to produce any mucus.  She also reports some wheezing, has been using her prescribed albuterol, Flonase and Spiriva with some relief. Yesterday she developed a low-grade fever of 99.91F at home and this morning it was 100.69F.  Reports a mild temporal headache with cough.  Also reports sharp chest pain with cough, denies chest pain outside of cough.  Has had some thick nasal congestion as well.  Denies visual disturbance, neck pain/stiffness, ear pain, sore throat, abdominal pain, nausea/vomiting, dysuria, urinary frequency, lightheadedness or dizziness.  Past Medical History:  Diagnosis Date  . Anxiety    Prior suicide attempt  . CAD (coronary artery disease)    a) s/p DES to LAD 07/2005 b) Last Myoview low risk 11/2011 showing small fixed apical perfusion defect (prior MI vs attenuation) but no ischemia - normal EF.  [name] Cervical spondylosis   . Coronary atherosclerosis 06/28/2008  . Depression   . Depression with anxiety   . Diabetes mellitus without complication ([place])   . GERD  (gastroesophageal reflux disease)   . Hyperlipidemia   . Hypertension   . Insulin resistance   . Iron deficiency anemia   . Obesity     Patient Active Problem List   Diagnosis Date Noted  . Left hip pain 10/28/2016  . Chronic rhinitis 08/07/2016  . Obstructive lung disease (generalized) ([place]) 04/25/2016  . Essential hypertension 01/30/2016  . High blood triglycerides   . Hypokalemia 01/27/2016  . Depression with anxiety   . CAD (coronary artery disease)   . HLD (hyperlipidemia)   . Type 2 diabetes mellitus without complication, without long-term current use of insulin ([place])   . OSA (obstructive sleep apnea) 05/06/2014  . Dyspnea on exertion 09/02/2012  . Chest pain 12/10/2011  . Chronic cough 05/11/2010  . Obesity (BMI 30-39.9)   . GERD 06/28/2008    Past Surgical History:  Procedure Laterality Date  . BREAST ENHANCEMENT SURGERY    . CARDIAC CATHETERIZATION  06/17/2007   NORMAL. EF 60%  . CARDIAC CATHETERIZATION N/A 01/29/2016   Procedure: Left Heart Cath and Coronary Angiography;  Surgeon: Sherren Mocha, MD;  Location: [facility];  Service: Cardiovascular;  Laterality: N/A;  . CERVICAL SPONDYLOSIS     SINGLE LEVEL FUSION  . CHILDBIRTH     X3  . CORONARY STENT PLACEMENT  07/2005   LEFT ANTERIOR DESCENDING  . FOREARM FRACTURE SURGERY  2010   hand and shoulder   . INCISION AND DRAINAGE BREAST ABSCESS  01/05/2012      .  INCISION AND DRAINAGE PERIRECTAL ABSCESS N/A 02/18/2014   Procedure: IRRIGATION AND DEBRIDEMENT PERIRECTAL ABSCESS;  Surgeon: Pedro Earls, MD;  Location: [facility];  Service: General;  Laterality: N/A;  . LUMBAR LAMINECTOMY    . ROTATOR CUFF REPAIR     bilaterla  . TONSILLECTOMY AND ADENOIDECTOMY    . TUBAL LIGATION    . VIDEO BRONCHOSCOPY Bilateral 08/13/2016   Procedure: VIDEO BRONCHOSCOPY WITHOUT FLUORO;  Surgeon: Collene Gobble, MD;  Location: [facility];  Service: Cardiopulmonary;  Laterality: Bilateral;    OB History    No data  available       Home Medications    Prior to Admission medications   Medication Sig Start Date End Date Taking? Authorizing Provider  acetaminophen (TYLENOL) 650 MG CR tablet Take 1,300 mg by mouth every 8 (eight) hours as needed for pain.    [provider]  albuterol (PROAIR HFA) 108 (90 Base) MCG/ACT inhaler Inhale 2 puffs into the lungs every 4 (four) hours as needed for wheezing or shortness of breath. 09/15/17   Collene Gobble, MD  aspirin EC 81 MG tablet Take 81 mg by mouth daily.    [provider]  benzonatate (TESSALON) 100 MG capsule Take 1 capsule (100 mg total) by mouth 3 (three) times daily as needed for cough. 04/03/17   Copland, Gay Filler, MD  calcium carbonate (TUMS - DOSED IN MG ELEMENTAL CALCIUM) 500 MG chewable tablet Chew 2 tablets by mouth daily as needed for indigestion or heartburn.    [provider]  cetirizine (ZYRTEC) 10 MG tablet TAKE 10 MG BY MOUTH DAILY 10/16/16   [provider]  Cholecalciferol (VITAMIN D3) 1000 UNITS CAPS Take 1,000 Units by mouth daily.     [provider]  clonazePAM (KLONOPIN) 0.5 MG tablet TAKE 0.5 TABLETS (0.25 MG TOTAL) BY MOUTH 2 TIMES DAILY AS NEEDED FOR ANXIETY Patient taking differently: TAKE 0.5 MG BY MOUTH 2 TIMES DAILY AS NEEDED FOR ANXIETY 10/16/16   Copland, Gay Filler, MD  Continuous Blood Gluc Receiver (FREESTYLE LIBRE READER) DEVI 1 Device by Does not apply route 2 (two) times daily. Patient not taking: Reported on 09/16/2017 02/20/17   Copland, Gay Filler, MD  Continuous Blood Gluc Sensor ([place]) MISC 3 each by Does not apply route 2 (two) times daily. Patient not taking: Reported on 09/16/2017 02/20/17   Copland, Gay Filler, MD  CRESTOR 20 MG tablet TAKE 1 TABLET BY MOUTH EVERY DAY Patient taking differently: TAKE 20 MG BY MOUTH EVERY DAY 11/16/14   Burtis Junes, NP  diphenhydramine-acetaminophen (TYLENOL PM) 25-500 MG TABS Take 2 tablets by mouth at bedtime as needed  (for sleep).     [provider]  FLUoxetine (PROZAC) 40 MG capsule Take 1 capsule (40 mg total) by mouth daily. 04/08/16   Copland, Gay Filler, MD  fluticasone (FLONASE) 50 MCG/ACT nasal spray Place 2 sprays into both nostrils daily. Patient taking differently: Place 2 sprays into both nostrils daily as needed for allergies.  09/15/17   Collene Gobble, MD  furosemide (LASIX) 20 MG tablet TAKE ONE TABLET DAILY IF NEEDED FOR LEG SWELLING Patient taking differently: TAKE 20 MG BY MOUTH DAILY 08/25/17   Copland, Gay Filler, MD  gabapentin (NEURONTIN) 300 MG capsule Take 600 mg by mouth 2 (two) times daily.     [provider]  hydrALAZINE (APRESOLINE) 25 MG tablet Take 2 tablets (50 mg total) by mouth 3 (three) times daily. 05/28/16  Copland, Gay Filler, MD  hydrOXYzine (ATARAX/VISTARIL) 25 MG tablet Take 1 tablet (25 mg total) by mouth at bedtime. 04/03/17   Copland, Gay Filler, MD  ibuprofen (ADVIL,MOTRIN) 200 MG tablet Take 800 mg by mouth every 6 (six) hours as needed for headache or moderate pain.     [provider]  Insulin Detemir (LEVEMIR FLEXTOUCH) 100 UNIT/ML Pen Inject 10 Units into the skin daily. 01/30/16   Barton Dubois, MD  Insulin Pen Needle 31G X 5 MM MISC Use to inject insulin as instructed once a day Patient not taking: Reported on 09/16/2017 01/30/16   Barton Dubois, MD  losartan (COZAAR) 100 MG tablet Take 1 tablet (100 mg total) by mouth daily. 09/10/17 12/09/17  Burtis Junes, NP  lovastatin (MEVACOR) 20 MG tablet Take 1 tablet (20 mg total) by mouth at bedtime. 04/18/17   Copland, Gay Filler, MD  metFORMIN (GLUCOPHAGE-XR) 500 MG 24 hr tablet Take 2 tablets (1,000 mg total) by mouth 2 (two) times daily. 06/09/17   Renato Shin, MD  methocarbamol (ROBAXIN) 750 MG tablet TAKE 1 TABLET BY MOUTH EVERY 12 HOURS AS NEEDED FOR MUSCLE SPASMS Patient taking differently: Take 750 mg by mouth every 12 (twelve) hours as needed for muscle spasms.  09/01/17   Copland, Gay Filler, MD  metoprolol succinate (TOPROL-XL) 100 MG 24 hr tablet Take 1 tablet (100 mg total) by mouth daily. 09/01/17   Copland, Gay Filler, MD  montelukast (SINGULAIR) 10 MG tablet Take 1 tablet (10 mg total) by mouth at bedtime. 09/15/17   Collene Gobble, MD  nitroGLYCERIN (NITROSTAT) 0.4 MG SL tablet Place 1 tablet (0.4 mg total) under the tongue every 5 (five) minutes as needed for chest pain. 09/01/17   Copland, Gay Filler, MD  omega-3 acid ethyl esters (LOVAZA) 1 g capsule Take 1 capsule (1 g total) by mouth 2 (two) times daily. 09/09/17   Copland, Gay Filler, MD  Polyvinyl Alcohol-Povidone (REFRESH OP) Place 2 drops into both eyes daily as needed (for dry eyes).    [provider]  potassium chloride (K-DUR) 10 MEQ tablet Take 1 tablet (10 mEq total) by mouth daily. Take only on days that you use lasix/ furosemide Patient taking differently: Take 10 mEq by mouth every evening.  09/06/17   Copland, Gay Filler, MD  repaglinide (PRANDIN) 2 MG tablet Take 1 tablet (2 mg total) by mouth 3 (three) times daily before meals. 10/17/15   Renato Shin, MD  Tiotropium Bromide Monohydrate (SPIRIVA RESPIMAT) 2.5 MCG/ACT AERS Inhale 2 puffs into the lungs daily. 09/15/17   Collene Gobble, MD    Family History Family History  Problem Relation Age of Onset  . Heart attack Mother   . Diabetes Mother   . Lung cancer Mother   . Asthma Mother   . Heart disease Mother   . Suicidality Father        "killed himself"  . Asthma Daughter        x 2  . Cancer Daughter        pre-cancerous polyp  . Diabetes Sister   . Cancer Sister   . Cervical cancer Daughter        cervical   . Allergies Other        all family--seasonal allergies    Social History Social History   Tobacco Use  . Smoking status: Never Smoker  . Smokeless tobacco: Never Used  Substance Use Topics  . Alcohol use: Yes    Comment:  occ  . Drug use: No     Allergies   Prednisone   Review of Systems Review of Systems    Constitutional: Positive for chills and fever.  HENT: Positive for congestion. Negative for sore throat and trouble swallowing.   Eyes: Negative for visual disturbance.  Respiratory: Positive for cough, shortness of breath and wheezing.   Cardiovascular: Positive for chest pain (with cough).  Gastrointestinal: Negative for abdominal pain, diarrhea, nausea and vomiting.  Genitourinary: Negative for difficulty urinating, dysuria, flank pain and frequency.  Musculoskeletal: Negative for gait problem.  Skin: Negative for rash.  Neurological: Positive for headaches. Negative for dizziness, weakness, light-headedness and numbness.  Psychiatric/Behavioral: Negative for agitation.     Physical Exam Updated Vital Signs BP (!) 162/57 (BP Location: Right Arm)   Pulse (!) 105   Temp (!) 100.9 F (38.3 C) (Oral)   Resp (!) 24   Ht 4\' 11"  (1.499 m)   Wt 80.7 kg (178 lb)   SpO2 91%   BMI 35.95 kg/m   Physical Exam  Constitutional: She is oriented to person, place, and time. She appears well-developed and well-nourished. No distress.  HENT:  Head: Normocephalic and atraumatic.  Mouth/Throat: Oropharynx is clear and moist. No oropharyngeal exudate.  Eyes: Conjunctivae are normal. Pupils are equal, round, and reactive to light. Right eye exhibits no discharge. Left eye exhibits no discharge.  Neck: Normal range of motion. Neck supple.  Cardiovascular: Exam reveals no friction rub.  Murmur (systolic 3/6) heard. Tachycardic, regular rhythm.  Pulmonary/Chest: Effort normal. No respiratory distress.  Good air movement. No respiratory distress, speaking in full sentences. On 2L [place]. Crackles present in right upper lung field. No wheezing, stridor or rhonchi.   Abdominal: Soft. Bowel sounds are normal. There is no tenderness. There is no guarding.  Musculoskeletal: Normal range of motion.  No leg swelling or calf tenderness.  Lymphadenopathy:    She has cervical adenopathy (bilateral anterior  chain lymphadenopathy).  Neurological: She is alert and oriented to person, place, and time. Coordination normal.  Skin: Skin is warm and dry. She is not diaphoretic.  Psychiatric: She has a normal mood and affect. Her behavior is normal.  Nursing note and vitals reviewed.    ED Treatments / Results  Labs (all labs ordered are listed, but only abnormal results are displayed) Labs Reviewed  CBC WITH DIFFERENTIAL/PLATELET - Abnormal; Notable for the following components:      Result Value   Hemoglobin 11.5 (*)    MCH 25.6 (*)    Neutro Abs 7.8 (*)    All other components within normal limits  COMPREHENSIVE METABOLIC PANEL - Abnormal; Notable for the following components:   Sodium 133 (*)    Chloride 95 (*)    Glucose, Bld 212 (*)    Calcium 8.8 (*)    Albumin 3.3 (*)    All other components within normal limits  I-STAT CG4 LACTIC ACID, ED - Abnormal; Notable for the following components:   Lactic Acid, Venous 2.53 (*)    All other components within normal limits  CULTURE, BLOOD (ROUTINE X 2)  CULTURE, BLOOD (ROUTINE X 2)  INFLUENZA PANEL BY PCR (TYPE A & B)  TROPONIN I  I-STAT CG4 LACTIC ACID, ED    EKG  EKG Interpretation  Date/Time:  Wednesday September 17 2017 16:53:09 EST Ventricular Rate:  101 PR Interval:    QRS Duration: 79 QT Interval:  324 QTC Calculation: 420 R Axis:   53 Text Interpretation:  Sinus  tachycardia Low voltage, precordial leads Probable anteroseptal infarct, old No significant change since last tracing Confirmed by Gareth Morgan [phone]) on 09/17/2017 5:16:30 PM       Radiology Dg Chest 2 View  Result Date: 09/17/2017 CLINICAL DATA:  Fever and congestion for 1 week EXAM: CHEST - 2 VIEW COMPARISON:  01/26/2016 FINDINGS: Cardiac shadow remains enlarged. Calcified breast implant is noted on the right and stable. Patchy infiltrative changes noted within the right upper lobe and right middle lobe consistent with acute pneumonia. Hyperinflation  consistent with COPD is noted as well. IMPRESSION: Right upper and middle lobe multifocal pneumonia. COPD. Electronically Signed   By: Inez Catalina M.D.   On: 09/17/2017 19:07    Procedures Procedures (including critical care time)  Medications Ordered in ED Medications  azithromycin (ZITHROMAX) 500 mg in sodium chloride 0.9 % 250 mL IVPB (0 mg Intravenous Stopped 09/17/17 1950)  cefTRIAXone (ROCEPHIN) 1 g in sodium chloride 0.9 % 100 mL IVPB (0 g Intravenous Stopped 09/17/17 1838)  azithromycin (ZITHROMAX) 500 MG injection (not administered)  acetaminophen (TYLENOL) tablet 1,000 mg (1,000 mg Oral Given 09/17/17 1734)  sodium chloride 0.9 % bolus 1,000 mL (0 mLs Intravenous Stopped 09/17/17 1839)  sodium chloride 0.9 % bolus 1,000 mL (0 mLs Intravenous Stopped 09/17/17 2211)  ondansetron (ZOFRAN) injection 4 mg (4 mg Intravenous Given 09/17/17 1940)  ibuprofen (ADVIL,MOTRIN) tablet 800 mg (800 mg Oral Given 09/17/17 2108)     Initial Impression / Assessment and Plan / ED Course  I have reviewed the triage vital signs and the nursing notes.  Pertinent labs & imaging results that were available during my care of the patient were reviewed by me and considered in my medical decision making (see chart for details).    Patient presents with fever and wet sounding cough. Has chest pain with cough, denies chest pain outside of this. Has a cardiac cath scheduled for tomorrow by Cardiologist Dr. Servando Snare for intermittent chest pain she has been having since her husband died in 2023/08/08.   On exam she is febrile with temp 100.63F and tachycardic to 105bpm. She has new oxygen requirement of 2L [place] (pulse ox 87% on RA.) She has crackles in the right upper lung fields.   Xray reveals right upper and middle lobe PNA. Blood cultures drawn and patient started on IV Rocephin and Zithromax. 2L fluid bolus started.   Labs reviewed. Patient initially with lactic acid of 2.53. This improved to 1.25 after fluid bolus. CBC  unremarkable, no leukocytosis. CMP largely unremarkable, she has a slight hyponatremia (Na+ 133), glucose elevated (bg 212, patient known diabetic.) Influenza negative.   Do not suspect ACS given chest pain only with cough. No n/v, diaphoresis, lightheadedness.   Plan to admit for sepsis in the setting of CAP.   This was a shared patient visit with Dr. Billy Fischer who also saw the patient and agreed with the above plan and admission.   Discussed this patient with Hospitalist Dr. Hal Hope who accepts this patient and will have her admitted at [facility].    Final Clinical Impressions(s) / ED Diagnoses   Final diagnoses:  Community acquired pneumonia of right middle lobe of lung [facility])    ED Discharge Orders    None       Bernarda Caffey 09/18/17 0018    Gareth Morgan, MD 09/18/17 1309

## 2017-09-17 NOTE — ED Notes (Signed)
Pt and family inquiring about how long for transport to [facility]. Explained reason for delay. Pt is awaiting bed at [facility]. Pt and family verbalized understanding. Apologized for wait. Comfort measures initiated.

## 2017-09-17 NOTE — ED Triage Notes (Signed)
Pt c/o cough since Dec 2018-NAD-steady gait

## 2017-09-17 NOTE — Progress Notes (Signed)
Pharmacy Antibiotic Note  Destiny Ballard is a 67 y.o. female admitted on 09/17/2017 with pneumonia.  Pharmacy has been consulted for ceftriaxone dosing. WBC wnl. LA 2.53  Plan: -Start ceftriaxone 1 gm IV Q 24 hours -Pharmacy to sign off since no further dosage adjustments necessary   Height: 4\' 11"  (149.9 cm) Weight: 178 lb (80.7 kg) IBW/kg (Calculated) : 43.2  Temp (24hrs), Avg:100.9 F (38.3 C), Min:100.9 F (38.3 C), Max:100.9 F (38.3 C)  Recent Labs  Lab 09/15/17 1523 09/17/17 1728 09/17/17 1729  WBC 14.3*  --  10.4  CREATININE 0.72  --   --   LATICACIDVEN  --  2.53*  --     Estimated Creatinine Clearance: 61.8 mL/min (by C-G formula based on SCr of 0.72 mg/dL).    Allergies  Allergen Reactions  . Prednisone Other (See Comments)    REACTION: mood swings, nightmares. "Shot doesn't bother me, reaction is just with the pill" she states she has had the steroid injections before. From our records methylprednisone was given to her in 2013 without any complications.    Thank you for allowing pharmacy to be a part of this patient's care.  Albertina Parr, PharmD., BCPS Clinical Pharmacist Clinical phone for 09/17/17 until 11pm: [phone] If after 11pm, please call main pharmacy at: [phone]

## 2017-09-17 NOTE — ED Notes (Signed)
Transported to xray 

## 2017-09-17 NOTE — Telephone Encounter (Signed)
S/w pt is coughing and running a fever of 100.9, is going to urgent care, cancelled cath for tomorrow, will revisit Friday.

## 2017-09-17 NOTE — Telephone Encounter (Signed)
New Message   Patient is calling in reference to her upcoming procedure tomorrow. She states that she has developed a harsh cough and wants to know can anything be prescribed. Please call to discuss.

## 2017-09-18 ENCOUNTER — Encounter (HOSPITAL_COMMUNITY): Admission: RE | Payer: Self-pay | Source: Ambulatory Visit

## 2017-09-18 ENCOUNTER — Ambulatory Visit (HOSPITAL_COMMUNITY): Admission: RE | Admit: 2017-09-18 | Payer: Medicare Other | Source: Ambulatory Visit | Admitting: Cardiovascular Disease

## 2017-09-18 DIAGNOSIS — I110 Hypertensive heart disease with heart failure: Secondary | ICD-10-CM | POA: Diagnosis present

## 2017-09-18 DIAGNOSIS — E785 Hyperlipidemia, unspecified: Secondary | ICD-10-CM

## 2017-09-18 DIAGNOSIS — Z7982 Long term (current) use of aspirin: Secondary | ICD-10-CM | POA: Diagnosis not present

## 2017-09-18 DIAGNOSIS — J9691 Respiratory failure, unspecified with hypoxia: Secondary | ICD-10-CM | POA: Diagnosis present

## 2017-09-18 DIAGNOSIS — J181 Lobar pneumonia, unspecified organism: Secondary | ICD-10-CM | POA: Diagnosis not present

## 2017-09-18 DIAGNOSIS — J440 Chronic obstructive pulmonary disease with acute lower respiratory infection: Secondary | ICD-10-CM | POA: Diagnosis present

## 2017-09-18 DIAGNOSIS — Z7951 Long term (current) use of inhaled steroids: Secondary | ICD-10-CM | POA: Diagnosis not present

## 2017-09-18 DIAGNOSIS — G4733 Obstructive sleep apnea (adult) (pediatric): Secondary | ICD-10-CM | POA: Diagnosis present

## 2017-09-18 DIAGNOSIS — Z833 Family history of diabetes mellitus: Secondary | ICD-10-CM | POA: Diagnosis not present

## 2017-09-18 DIAGNOSIS — E119 Type 2 diabetes mellitus without complications: Secondary | ICD-10-CM

## 2017-09-18 DIAGNOSIS — J9601 Acute respiratory failure with hypoxia: Secondary | ICD-10-CM | POA: Diagnosis present

## 2017-09-18 DIAGNOSIS — Z8249 Family history of ischemic heart disease and other diseases of the circulatory system: Secondary | ICD-10-CM | POA: Diagnosis not present

## 2017-09-18 DIAGNOSIS — Z7984 Long term (current) use of oral hypoglycemic drugs: Secondary | ICD-10-CM | POA: Diagnosis not present

## 2017-09-18 DIAGNOSIS — Z915 Personal history of self-harm: Secondary | ICD-10-CM | POA: Diagnosis not present

## 2017-09-18 DIAGNOSIS — E1142 Type 2 diabetes mellitus with diabetic polyneuropathy: Secondary | ICD-10-CM | POA: Diagnosis present

## 2017-09-18 DIAGNOSIS — I5032 Chronic diastolic (congestive) heart failure: Secondary | ICD-10-CM | POA: Diagnosis not present

## 2017-09-18 DIAGNOSIS — R6520 Severe sepsis without septic shock: Secondary | ICD-10-CM | POA: Diagnosis present

## 2017-09-18 DIAGNOSIS — I10 Essential (primary) hypertension: Secondary | ICD-10-CM | POA: Diagnosis not present

## 2017-09-18 DIAGNOSIS — I2510 Atherosclerotic heart disease of native coronary artery without angina pectoris: Secondary | ICD-10-CM | POA: Diagnosis present

## 2017-09-18 DIAGNOSIS — J441 Chronic obstructive pulmonary disease with (acute) exacerbation: Secondary | ICD-10-CM | POA: Diagnosis present

## 2017-09-18 DIAGNOSIS — E1165 Type 2 diabetes mellitus with hyperglycemia: Secondary | ICD-10-CM | POA: Diagnosis present

## 2017-09-18 DIAGNOSIS — Z79899 Other long term (current) drug therapy: Secondary | ICD-10-CM | POA: Diagnosis not present

## 2017-09-18 DIAGNOSIS — K219 Gastro-esophageal reflux disease without esophagitis: Secondary | ICD-10-CM | POA: Diagnosis present

## 2017-09-18 DIAGNOSIS — F418 Other specified anxiety disorders: Secondary | ICD-10-CM | POA: Diagnosis not present

## 2017-09-18 DIAGNOSIS — R0602 Shortness of breath: Secondary | ICD-10-CM | POA: Diagnosis not present

## 2017-09-18 DIAGNOSIS — A419 Sepsis, unspecified organism: Secondary | ICD-10-CM | POA: Diagnosis not present

## 2017-09-18 HISTORY — DX: Chronic diastolic (congestive) heart failure: I50.32

## 2017-09-18 HISTORY — DX: Sepsis, unspecified organism: A41.9

## 2017-09-18 LAB — PROCALCITONIN: Procalcitonin: <0.1 ng/mL

## 2017-09-18 LAB — STREP PNEUMONIAE URINARY ANTIGEN: Strep Pneumo Urinary Antigen: NEGATIVE

## 2017-09-18 LAB — GLUCOSE, CAPILLARY
Glucose-Capillary: 142 mg/dL — ABNORMAL HIGH (ref 65–99)
Glucose-Capillary: 188 mg/dL — ABNORMAL HIGH (ref 65–99)
Glucose-Capillary: 156 mg/dL — ABNORMAL HIGH (ref 65–99)
Glucose-Capillary: 187 mg/dL — ABNORMAL HIGH (ref 65–99)
Glucose-Capillary: 145 mg/dL — ABNORMAL HIGH (ref 65–99)

## 2017-09-18 LAB — BRAIN NATRIURETIC PEPTIDE: B Natriuretic Peptide: 45.7 pg/mL (ref 0.0–100.0)

## 2017-09-18 LAB — HIV ANTIBODY (ROUTINE TESTING W REFLEX): HIV Screen 4th Generation wRfx: NONREACTIVE

## 2017-09-18 LAB — LACTIC ACID, PLASMA: Lactic Acid, Venous: 1 mmol/L (ref 0.5–1.9)

## 2017-09-18 SURGERY — LEFT HEART CATH AND CORONARY ANGIOGRAPHY
Anesthesia: LOCAL

## 2017-09-18 MED ORDER — INSULIN DETEMIR 100 UNIT/ML ~~LOC~~ SOLN
7.0000 [IU] | Freq: Every day | SUBCUTANEOUS | Status: DC
  Administered 2017-09-18 – 2017-09-20 (×3): 7 [IU] via SUBCUTANEOUS
  Filled 2017-09-18 (×5): qty 0.07

## 2017-09-18 MED ORDER — CALCIUM CARBONATE ANTACID 500 MG PO CHEW: 2.0000 | CHEWABLE_TABLET | Freq: Every day | ORAL | Status: DC | PRN

## 2017-09-18 MED ORDER — ONDANSETRON HCL 4 MG/2ML IJ SOLN
4.0000 mg | Freq: Three times a day (TID) | INTRAMUSCULAR | Status: DC | PRN
  Administered 2017-09-18: 4 mg via INTRAVENOUS
  Filled 2017-09-18: qty 2

## 2017-09-18 MED ORDER — ALBUTEROL SULFATE (2.5 MG/3ML) 0.083% IN NEBU: 2.5000 mg | INHALATION_SOLUTION | RESPIRATORY_TRACT | Status: DC | PRN

## 2017-09-18 MED ORDER — HYDRALAZINE HCL 20 MG/ML IJ SOLN: 5.0000 mg | INTRAMUSCULAR | Status: DC | PRN

## 2017-09-18 MED ORDER — FLUOXETINE HCL 20 MG PO CAPS
40.0000 mg | ORAL_CAPSULE | Freq: Every day | ORAL | Status: DC
  Administered 2017-09-18 – 2017-09-22 (×5): 40 mg via ORAL
  Filled 2017-09-18 (×5): qty 2

## 2017-09-18 MED ORDER — POLYVINYL ALCOHOL 1.4 % OP SOLN
1.0000 [drp] | Freq: Two times a day (BID) | OPHTHALMIC | Status: DC
  Administered 2017-09-18 – 2017-09-22 (×7): 1 [drp] via OPHTHALMIC
  Filled 2017-09-18 (×2): qty 15

## 2017-09-18 MED ORDER — INSULIN ASPART 100 UNIT/ML ~~LOC~~ SOLN
0.0000 [IU] | Freq: Three times a day (TID) | SUBCUTANEOUS | Status: DC
  Administered 2017-09-18 – 2017-09-19 (×4): 2 [IU] via SUBCUTANEOUS
  Administered 2017-09-19 (×2): 3 [IU] via SUBCUTANEOUS
  Administered 2017-09-20 (×2): 9 [IU] via SUBCUTANEOUS
  Administered 2017-09-20: 5 [IU] via SUBCUTANEOUS
  Administered 2017-09-21: 7 [IU] via SUBCUTANEOUS
  Administered 2017-09-21 (×2): 9 [IU] via SUBCUTANEOUS
  Administered 2017-09-22: 7 [IU] via SUBCUTANEOUS
  Administered 2017-09-22: 9 [IU] via SUBCUTANEOUS

## 2017-09-18 MED ORDER — LOSARTAN POTASSIUM 50 MG PO TABS
100.0000 mg | ORAL_TABLET | Freq: Every day | ORAL | Status: DC
  Administered 2017-09-18 – 2017-09-22 (×5): 100 mg via ORAL
  Filled 2017-09-18 (×5): qty 2

## 2017-09-18 MED ORDER — GABAPENTIN 300 MG PO CAPS
600.0000 mg | ORAL_CAPSULE | Freq: Two times a day (BID) | ORAL | Status: DC
  Administered 2017-09-18 – 2017-09-22 (×9): 600 mg via ORAL
  Filled 2017-09-18 (×9): qty 2

## 2017-09-18 MED ORDER — VITAMIN D 1000 UNITS PO TABS
1000.0000 [IU] | ORAL_TABLET | Freq: Every day | ORAL | Status: DC
  Administered 2017-09-18 – 2017-09-22 (×5): 1000 [IU] via ORAL
  Filled 2017-09-18 (×5): qty 1

## 2017-09-18 MED ORDER — NITROGLYCERIN 0.4 MG SL SUBL: 0.4000 mg | SUBLINGUAL_TABLET | SUBLINGUAL | Status: DC | PRN

## 2017-09-18 MED ORDER — LORATADINE 10 MG PO TABS
10.0000 mg | ORAL_TABLET | Freq: Every day | ORAL | Status: DC
  Administered 2017-09-18 – 2017-09-22 (×5): 10 mg via ORAL
  Filled 2017-09-18 (×5): qty 1

## 2017-09-18 MED ORDER — FLUTICASONE PROPIONATE 50 MCG/ACT NA SUSP: 2.0000 | Freq: Every day | NASAL | Status: DC | PRN

## 2017-09-18 MED ORDER — OMEGA-3-ACID ETHYL ESTERS 1 G PO CAPS
1.0000 | ORAL_CAPSULE | Freq: Two times a day (BID) | ORAL | Status: DC
  Administered 2017-09-18 – 2017-09-22 (×9): 1 g via ORAL
  Filled 2017-09-18 (×9): qty 1

## 2017-09-18 MED ORDER — MONTELUKAST SODIUM 10 MG PO TABS
10.0000 mg | ORAL_TABLET | Freq: Every day | ORAL | Status: DC
Start: 2017-09-18 — End: 2017-09-22
  Administered 2017-09-18 – 2017-09-21 (×4): 10 mg via ORAL
  Filled 2017-09-18 (×4): qty 1

## 2017-09-18 MED ORDER — TIOTROPIUM BROMIDE MONOHYDRATE 18 MCG IN CAPS
18.0000 ug | ORAL_CAPSULE | Freq: Every day | RESPIRATORY_TRACT | Status: DC
  Administered 2017-09-18: 18 ug via RESPIRATORY_TRACT
  Filled 2017-09-18: qty 5

## 2017-09-18 MED ORDER — HYDRALAZINE HCL 50 MG PO TABS
50.0000 mg | ORAL_TABLET | Freq: Three times a day (TID) | ORAL | Status: DC
  Administered 2017-09-18 – 2017-09-22 (×13): 50 mg via ORAL
  Filled 2017-09-18 (×13): qty 1

## 2017-09-18 MED ORDER — DIPHENHYDRAMINE-APAP (SLEEP) 25-500 MG PO TABS: 2.0000 | ORAL_TABLET | Freq: Every evening | ORAL | Status: DC | PRN

## 2017-09-18 MED ORDER — CARBOXYMETHYLCELLULOSE SODIUM 1 % OP SOLN: 1.0000 [drp] | Freq: Two times a day (BID) | OPHTHALMIC | Status: DC

## 2017-09-18 MED ORDER — ZOLPIDEM TARTRATE 5 MG PO TABS
5.0000 mg | ORAL_TABLET | Freq: Every evening | ORAL | Status: DC | PRN
  Administered 2017-09-18 – 2017-09-21 (×2): 5 mg via ORAL
  Filled 2017-09-18 (×2): qty 1

## 2017-09-18 MED ORDER — ENOXAPARIN SODIUM 40 MG/0.4ML ~~LOC~~ SOLN
40.0000 mg | SUBCUTANEOUS | Status: DC
  Administered 2017-09-18 – 2017-09-22 (×5): 40 mg via SUBCUTANEOUS
  Filled 2017-09-18 (×5): qty 0.4

## 2017-09-18 MED ORDER — METHOCARBAMOL 500 MG PO TABS: 750.0000 mg | ORAL_TABLET | Freq: Two times a day (BID) | ORAL | Status: DC | PRN

## 2017-09-18 MED ORDER — ROSUVASTATIN CALCIUM 20 MG PO TABS
20.0000 mg | ORAL_TABLET | Freq: Every day | ORAL | Status: DC
  Administered 2017-09-18 – 2017-09-22 (×5): 20 mg via ORAL
  Filled 2017-09-18 (×5): qty 1

## 2017-09-18 MED ORDER — GUAIFENESIN-DM 100-10 MG/5ML PO SYRP
5.0000 mL | ORAL_SOLUTION | ORAL | Status: DC | PRN
Start: 2017-09-18 — End: 2017-09-22
  Administered 2017-09-18 – 2017-09-22 (×10): 5 mL via ORAL
  Filled 2017-09-18 (×10): qty 5

## 2017-09-18 MED ORDER — CLONAZEPAM 0.5 MG PO TABS
0.5000 mg | ORAL_TABLET | Freq: Two times a day (BID) | ORAL | Status: DC | PRN
  Administered 2017-09-20 – 2017-09-21 (×2): 0.5 mg via ORAL
  Filled 2017-09-18 (×2): qty 1

## 2017-09-18 MED ORDER — ALBUTEROL SULFATE HFA 108 (90 BASE) MCG/ACT IN AERS
2.0000 | INHALATION_SPRAY | RESPIRATORY_TRACT | Status: DC | PRN
  Administered 2017-09-18: 2 via RESPIRATORY_TRACT
  Filled 2017-09-18: qty 6.7

## 2017-09-18 MED ORDER — BENZONATATE 100 MG PO CAPS
100.0000 mg | ORAL_CAPSULE | Freq: Three times a day (TID) | ORAL | Status: DC | PRN
Start: 2017-09-18 — End: 2017-09-20
  Administered 2017-09-18: 100 mg via ORAL
  Filled 2017-09-18 (×2): qty 1

## 2017-09-18 MED ORDER — ACETAMINOPHEN 500 MG PO TABS: 500.0000 mg | ORAL_TABLET | Freq: Every evening | ORAL | Status: DC | PRN

## 2017-09-18 MED ORDER — IBUPROFEN 400 MG PO TABS
400.0000 mg | ORAL_TABLET | Freq: Four times a day (QID) | ORAL | Status: DC | PRN
  Administered 2017-09-18 – 2017-09-20 (×2): 400 mg via ORAL
  Filled 2017-09-18 (×2): qty 1

## 2017-09-18 MED ORDER — HYDROXYZINE HCL 25 MG PO TABS
25.0000 mg | ORAL_TABLET | Freq: Every day | ORAL | Status: DC
  Administered 2017-09-18 – 2017-09-21 (×4): 25 mg via ORAL
  Filled 2017-09-18 (×4): qty 1

## 2017-09-18 MED ORDER — DIPHENHYDRAMINE HCL 25 MG PO CAPS
25.0000 mg | ORAL_CAPSULE | Freq: Every evening | ORAL | Status: DC | PRN
  Administered 2017-09-18: 25 mg via ORAL
  Filled 2017-09-18 (×2): qty 1

## 2017-09-18 MED ORDER — ACETAMINOPHEN 325 MG PO TABS
650.0000 mg | ORAL_TABLET | Freq: Four times a day (QID) | ORAL | Status: DC | PRN
  Administered 2017-09-18 – 2017-09-19 (×3): 650 mg via ORAL
  Filled 2017-09-18 (×4): qty 2

## 2017-09-18 MED ORDER — METOPROLOL SUCCINATE ER 100 MG PO TB24
100.0000 mg | ORAL_TABLET | Freq: Every day | ORAL | Status: DC
Start: 2017-09-18 — End: 2017-09-22
  Administered 2017-09-18 – 2017-09-22 (×5): 100 mg via ORAL
  Filled 2017-09-18 (×5): qty 1

## 2017-09-18 MED ORDER — ASPIRIN EC 81 MG PO TBEC
81.0000 mg | DELAYED_RELEASE_TABLET | Freq: Every day | ORAL | Status: DC
  Administered 2017-09-18 – 2017-09-22 (×5): 81 mg via ORAL
  Filled 2017-09-18 (×5): qty 1

## 2017-09-18 NOTE — H&P (Signed)
History and Physical    Destiny Ballard VZC:[id number] DOB: 1950/11/20 DOA: 09/17/2017  Referring MD/NP/PA:   PCP: Darreld Mclean, MD   Patient coming from:  The patient is coming from home.  At baseline, pt is independent for most of ADL.        Chief Complaint: Cough, shortness of breath, fever, chills  HPI: Destiny Ballard is a 67 y.o. female with medical history significant of hypertension, diabetes mellitus, hyperlipidemia, GERD, depression with anxiety, obesity, dCHF, CAD, stent placement, who presents with cough, shortness breath.  Patient states that she has been having coughing for more than 2 month, which has worsened in the past for several days. She has dry cough, no chest pain at resting., but stating that coughing induced some chest pain. Denies runny nose or sore throat. She developed fever and chills today. She has nausea, no vomiting, diarrhea or abdominal pain. No symptoms of UTI or unilateral weakness. Of note, she has hx of CAD and was scheduled for cardiac cath tomorrow by her cardiologist.  ED Course: pt was found to have WBC 10.4, lactic acid 1.25, negative troponin, negative flu PCR, creatinine normal, temperature 100.9, tachycardia, tachypnea, oxygen saturation 91-98% on room air, chest x-ray showed COPD and multifocal infiltration in RML and RUL. Pt is placed on telemetry bed for observation.   Review of Systems:   General: has fevers, chills, no body weight gain, has poor appetite, has fatigue HEENT: no blurry vision, hearing changes or sore throat Respiratory: has dyspnea, coughing, no wheezing CV: no chest pain, no palpitations GI: no nausea, vomiting, abdominal pain, diarrhea, constipation GU: no dysuria, burning on urination, increased urinary frequency, hematuria  Ext: no leg edema Neuro: no unilateral weakness, numbness, or tingling, no vision change or hearing loss Skin: no rash, no skin tear. MSK: No muscle spasm, no deformity, no limitation of  range of movement in spin Heme: No easy bruising.  Travel history: No recent long distant travel.  Allergy:  Allergies  Allergen Reactions  . Prednisone Other (See Comments)    REACTION: mood swings, nightmares. "Shot doesn't bother me, reaction is just with the pill" she states she has had the steroid injections before. From our records methylprednisone was given to her in 2013 without any complications.    Past Medical History:  Diagnosis Date  . Anxiety    Prior suicide attempt  . CAD (coronary artery disease)    a) s/p DES to LAD 07/2005 b) Last Myoview low risk 11/2011 showing small fixed apical perfusion defect (prior MI vs attenuation) but no ischemia - normal EF.  [name] Cervical spondylosis   . Coronary atherosclerosis 06/28/2008  . Depression   . Depression with anxiety   . Diabetes mellitus without complication ([place])   . GERD (gastroesophageal reflux disease)   . Hyperlipidemia   . Hypertension   . Insulin resistance   . Iron deficiency anemia   . Obesity     Past Surgical History:  Procedure Laterality Date  . BREAST ENHANCEMENT SURGERY    . CARDIAC CATHETERIZATION  06/17/2007   NORMAL. EF 60%  . CARDIAC CATHETERIZATION N/A 01/29/2016   Procedure: Left Heart Cath and Coronary Angiography;  Surgeon: Sherren Mocha, MD;  Location: [facility];  Service: Cardiovascular;  Laterality: N/A;  . CERVICAL SPONDYLOSIS     SINGLE LEVEL FUSION  . CHILDBIRTH     X3  . CORONARY STENT PLACEMENT  07/2005   LEFT ANTERIOR DESCENDING  . FOREARM FRACTURE SURGERY  2010   hand and shoulder   . INCISION AND DRAINAGE BREAST ABSCESS  01/05/2012      . INCISION AND DRAINAGE PERIRECTAL ABSCESS N/A 02/18/2014   Procedure: IRRIGATION AND DEBRIDEMENT PERIRECTAL ABSCESS;  Surgeon: Pedro Earls, MD;  Location: [facility];  Service: General;  Laterality: N/A;  . LUMBAR LAMINECTOMY    . ROTATOR CUFF REPAIR     bilaterla  . TONSILLECTOMY AND ADENOIDECTOMY    . TUBAL LIGATION    . VIDEO  BRONCHOSCOPY Bilateral 08/13/2016   Procedure: VIDEO BRONCHOSCOPY WITHOUT FLUORO;  Surgeon: Collene Gobble, MD;  Location: [facility];  Service: Cardiopulmonary;  Laterality: Bilateral;    Social History:  reports that  has never smoked. she has never used smokeless tobacco. She reports that she drinks alcohol. She reports that she does not use drugs.  Family History:  Family History  Problem Relation Age of Onset  . Heart attack Mother   . Diabetes Mother   . Lung cancer Mother   . Asthma Mother   . Heart disease Mother   . Suicidality Father        "killed himself"  . Asthma Daughter        x 2  . Cancer Daughter        pre-cancerous polyp  . Diabetes Sister   . Cancer Sister   . Cervical cancer Daughter        cervical   . Allergies Other        all family--seasonal allergies     Prior to Admission medications   Medication Sig Start Date End Date Taking? Authorizing Provider  acetaminophen (TYLENOL) 650 MG CR tablet Take 1,300 mg by mouth every 8 (eight) hours as needed for pain.    [provider]  albuterol (PROAIR HFA) 108 (90 Base) MCG/ACT inhaler Inhale 2 puffs into the lungs every 4 (four) hours as needed for wheezing or shortness of breath. 09/15/17   Collene Gobble, MD  aspirin EC 81 MG tablet Take 81 mg by mouth daily.    [provider]  benzonatate (TESSALON) 100 MG capsule Take 1 capsule (100 mg total) by mouth 3 (three) times daily as needed for cough. 04/03/17   Copland, Gay Filler, MD  calcium carbonate (TUMS - DOSED IN MG ELEMENTAL CALCIUM) 500 MG chewable tablet Chew 2 tablets by mouth daily as needed for indigestion or heartburn.    [provider]  cetirizine (ZYRTEC) 10 MG tablet TAKE 10 MG BY MOUTH DAILY 10/16/16   [provider]  Cholecalciferol (VITAMIN D3) 1000 UNITS CAPS Take 1,000 Units by mouth daily.     [provider]  clonazePAM (KLONOPIN) 0.5 MG tablet TAKE 0.5 TABLETS (0.25 MG TOTAL) BY MOUTH 2 TIMES  DAILY AS NEEDED FOR ANXIETY Patient taking differently: TAKE 0.5 MG BY MOUTH 2 TIMES DAILY AS NEEDED FOR ANXIETY 10/16/16   Copland, Gay Filler, MD  Continuous Blood Gluc Receiver (FREESTYLE LIBRE READER) DEVI 1 Device by Does not apply route 2 (two) times daily. Patient not taking: Reported on 09/16/2017 02/20/17   Copland, Gay Filler, MD  Continuous Blood Gluc Sensor (Wells) MISC 3 each by Does not apply route 2 (two) times daily. Patient not taking: Reported on 09/16/2017 02/20/17   Copland, Gay Filler, MD  CRESTOR 20 MG tablet TAKE 1 TABLET BY MOUTH EVERY DAY Patient taking differently: TAKE 20 MG BY MOUTH EVERY DAY 11/16/14   Burtis Junes, NP  diphenhydramine-acetaminophen (TYLENOL  PM) 25-500 MG TABS Take 2 tablets by mouth at bedtime as needed (for sleep).     [provider]  FLUoxetine (PROZAC) 40 MG capsule Take 1 capsule (40 mg total) by mouth daily. 04/08/16   Copland, Gay Filler, MD  fluticasone (FLONASE) 50 MCG/ACT nasal spray Place 2 sprays into both nostrils daily. Patient taking differently: Place 2 sprays into both nostrils daily as needed for allergies.  09/15/17   Collene Gobble, MD  furosemide (LASIX) 20 MG tablet TAKE ONE TABLET DAILY IF NEEDED FOR LEG SWELLING Patient taking differently: TAKE 20 MG BY MOUTH DAILY 08/25/17   Copland, Gay Filler, MD  gabapentin (NEURONTIN) 300 MG capsule Take 600 mg by mouth 2 (two) times daily.     [provider]  hydrALAZINE (APRESOLINE) 25 MG tablet Take 2 tablets (50 mg total) by mouth 3 (three) times daily. 05/28/16   Copland, Gay Filler, MD  hydrOXYzine (ATARAX/VISTARIL) 25 MG tablet Take 1 tablet (25 mg total) by mouth at bedtime. 04/03/17   Copland, Gay Filler, MD  ibuprofen (ADVIL,MOTRIN) 200 MG tablet Take 800 mg by mouth every 6 (six) hours as needed for headache or moderate pain.     [provider]  Insulin Detemir (LEVEMIR FLEXTOUCH) 100 UNIT/ML Pen Inject 10 Units into the skin daily. 01/30/16    Barton Dubois, MD  Insulin Pen Needle 31G X 5 MM MISC Use to inject insulin as instructed once a day Patient not taking: Reported on 09/16/2017 01/30/16   Barton Dubois, MD  losartan (COZAAR) 100 MG tablet Take 1 tablet (100 mg total) by mouth daily. 09/10/17 12/09/17  Burtis Junes, NP  lovastatin (MEVACOR) 20 MG tablet Take 1 tablet (20 mg total) by mouth at bedtime. 04/18/17   Copland, Gay Filler, MD  metFORMIN (GLUCOPHAGE-XR) 500 MG 24 hr tablet Take 2 tablets (1,000 mg total) by mouth 2 (two) times daily. 06/09/17   Renato Shin, MD  methocarbamol (ROBAXIN) 750 MG tablet TAKE 1 TABLET BY MOUTH EVERY 12 HOURS AS NEEDED FOR MUSCLE SPASMS Patient taking differently: Take 750 mg by mouth every 12 (twelve) hours as needed for muscle spasms.  09/01/17   Copland, Gay Filler, MD  metoprolol succinate (TOPROL-XL) 100 MG 24 hr tablet Take 1 tablet (100 mg total) by mouth daily. 09/01/17   Copland, Gay Filler, MD  montelukast (SINGULAIR) 10 MG tablet Take 1 tablet (10 mg total) by mouth at bedtime. 09/15/17   Collene Gobble, MD  nitroGLYCERIN (NITROSTAT) 0.4 MG SL tablet Place 1 tablet (0.4 mg total) under the tongue every 5 (five) minutes as needed for chest pain. 09/01/17   Copland, Gay Filler, MD  omega-3 acid ethyl esters (LOVAZA) 1 g capsule Take 1 capsule (1 g total) by mouth 2 (two) times daily. 09/09/17   Copland, Gay Filler, MD  Polyvinyl Alcohol-Povidone (REFRESH OP) Place 2 drops into both eyes daily as needed (for dry eyes).    [provider]  potassium chloride (K-DUR) 10 MEQ tablet Take 1 tablet (10 mEq total) by mouth daily. Take only on days that you use lasix/ furosemide Patient taking differently: Take 10 mEq by mouth every evening.  09/06/17   Copland, Gay Filler, MD  repaglinide (PRANDIN) 2 MG tablet Take 1 tablet (2 mg total) by mouth 3 (three) times daily before meals. 10/17/15   Renato Shin, MD  Tiotropium Bromide Monohydrate (SPIRIVA RESPIMAT) 2.5 MCG/ACT AERS Inhale 2 puffs into the  lungs daily. 09/15/17   Collene Gobble,  MD    Physical Exam: Vitals:   09/18/17 0035 09/18/17 0106 09/18/17 0210 09/18/17 0420  BP:  (!) 147/66 (!) 153/61 (!) 146/60  Pulse:  94  (!) 101  Resp:  19  18  Temp:   99 F (37.2 C) 98.6 F (37 C)  TempSrc:   Oral Oral  SpO2: 95% 96% 96% 94%  Weight:   83.2 kg (183 lb 6.8 oz)   Height:   5' (1.524 m)    General: Not in acute distress HEENT:       Eyes: PERRL, EOMI, no scleral icterus.       ENT: No discharge from the ears and nose, no pharynx injection, no tonsillar enlargement.        Neck: No JVD, no bruit, no mass felt. Heme: No neck lymph node enlargement. Cardiac: S1/S2, RRR, No murmurs, No gallops or rubs. Respiratory: Has scattered rhonchi bilaterally.  GI: Soft, nondistended, nontender, no rebound pain, no organomegaly, BS present. GU: No hematuria Ext: No pitting leg edema bilaterally. 2+DP/PT pulse bilaterally. Musculoskeletal: No joint deformities, No joint redness or warmth, no limitation of ROM in spin. Skin: No rashes.  Neuro: Alert, oriented X3, cranial nerves II-XII grossly intact, moves all extremities normally. Psych: Patient is not psychotic, no suicidal or hemocidal ideation.  Labs on Admission: I have personally reviewed following labs and imaging studies  CBC: Recent Labs  Lab 09/15/17 1523 09/17/17 1729  WBC 14.3* 10.4  NEUTROABS  --  7.8*  HGB 12.5 11.5*  HCT 38.6 36.0  MCV 80 80.0  PLT 345 220   Basic Metabolic Panel: Recent Labs  Lab 09/15/17 1523 09/17/17 1729  NA 139 133*  K 4.2 3.5  CL 97 95*  CO2 25 28  GLUCOSE 184* 212*  BUN 14 12  CREATININE 0.72 0.73  CALCIUM 9.5 8.8*   GFR: Estimated Creatinine Clearance: 64.4 mL/min (by C-G formula based on SCr of 0.73 mg/dL). Liver Function Tests: Recent Labs  Lab 09/17/17 1729  AST 31  ALT 18  ALKPHOS 94  BILITOT 0.4  PROT 7.4  ALBUMIN 3.3*   No results for input(s): LIPASE, AMYLASE in the last 168 hours. No results for input(s):  AMMONIA in the last 168 hours. Coagulation Profile: Recent Labs  Lab 09/15/17 1523  INR 0.9   Cardiac Enzymes: Recent Labs  Lab 09/17/17 2222  TROPONINI <0.03   BNP (last 3 results) No results for input(s): PROBNP in the last 8760 hours. HbA1C: No results for input(s): HGBA1C in the last 72 hours. CBG: Recent Labs  Lab 09/18/17 0213  GLUCAP 142*   Lipid Profile: No results for input(s): CHOL, HDL, LDLCALC, TRIG, CHOLHDL, LDLDIRECT in the last 72 hours. Thyroid Function Tests: No results for input(s): TSH, T4TOTAL, FREET4, T3FREE, THYROIDAB in the last 72 hours. Anemia Panel: No results for input(s): VITAMINB12, FOLATE, FERRITIN, TIBC, IRON, RETICCTPCT in the last 72 hours. Urine analysis:    Component Value Date/Time   COLORURINE YELLOW 10/12/2015 2038   APPEARANCEUR CLEAR (A) 10/12/2015 2038   LABSPEC 1.020 10/12/2015 2038   PHURINE 5.0 10/12/2015 2038   GLUCOSEU NEGATIVE 10/12/2015 2038   HGBUR NEGATIVE 10/12/2015 2038   BILIRUBINUR NEGATIVE 10/12/2015 2038   BILIRUBINUR neg 06/18/2012 1231   KETONESUR NEGATIVE 10/12/2015 2038   PROTEINUR NEGATIVE 10/12/2015 2038   UROBILINOGEN 0.2 06/18/2012 1231   NITRITE NEGATIVE 10/12/2015 2038   LEUKOCYTESUR SMALL (A) 10/12/2015 2038   Sepsis Labs: @LABRCNTIP (procalcitonin:4,lacticidven:4) )No results found for this or any previous  visit (from the past 240 hour(s)).   Radiological Exams on Admission: Dg Chest 2 View  Result Date: 09/17/2017 CLINICAL DATA:  Fever and congestion for 1 week EXAM: CHEST - 2 VIEW COMPARISON:  01/26/2016 FINDINGS: Cardiac shadow remains enlarged. Calcified breast implant is noted on the right and stable. Patchy infiltrative changes noted within the right upper lobe and right middle lobe consistent with acute pneumonia. Hyperinflation consistent with COPD is noted as well. IMPRESSION: Right upper and middle lobe multifocal pneumonia. COPD. Electronically Signed   By: Inez Catalina M.D.   On:  09/17/2017 19:07     EKG: Independently reviewed.  Sinus rhythm, QTC 420, anteroseptal infarction pattern, low voltage Assessment/Plan Principal Problem:   Lobar pneumonia (HCC) Active Problems:   Type 2 diabetes mellitus without complication, without long-term current use of insulin (HCC)   HLD (hyperlipidemia)   Depression with anxiety   CAD (coronary artery disease)   Essential hypertension   Sepsis (HCC)   Chronic diastolic CHF (congestive heart failure) (HCC)   Lobar pneumonia and sepsis: chest x-ray showed multifocal infiltration in RML and RUL. Patient admitted critically for sepsis with fever, tachycardia and tachypnea. Lactic acid is normal. Hemodynamically stable.   - Will place on telemetry bed for obs. - IV Rocephin and azithromycin, doxycycline - tessalon for cough  - prn Albuterol Nebs for SOB; also on spirava inhaler - Urine legionella and S. pneumococcal antigen - Follow up blood culture x2, sputum culture - will get Procalcitonin and trend lactic acid level per sepsis protocol - IVF: 2L of NS bolus in ED  Type 2 diabetes mellitus without complication, without long-term current use of insulin ([place]): Last A1c 8.2 on 09/01/17, poorly controled. Patient is taking Levemir, metformin, Prandin at home -will decrease Levemir dose from  10-7 units daily -SSI  HLD: -crestor  CAD: no CP. -on ASA. crestor  Depression and anxiety: Stable, no suicidal or homicidal ideations. -Continue home medications: Prozac, Klonopin   Essential hypertension -Continue home medications: Metoprolol, Cozaar -IV hydralazine prn  Chronic diastolic CHF (congestive heart failure) ([place]): 2-D echo on 09/03/17 showed EF 60-65% with grade 1 diastolic dysfunction. Patient does not have leg edema JVD. CHF seems to be compensated. -Hold her Lasix due to sepsis -Check BNP   DVT ppX:  SQ Lovenox Code Status: Full code Family Communication: None at bed side.    Disposition Plan:  Anticipate  discharge back to previous home environment Consults called:  noe Admission status: Obs / tele       Date of Service 09/18/2017    Ivor Costa [facility] Pager [phone]  If 7PM-7AM, please contact night-coverage [email or web address] Password [facility] 09/18/2017, 6:32 AM

## 2017-09-18 NOTE — Progress Notes (Addendum)
Not a billable note PROGRESS NOTE  Destiny Ballard BJY:[id number] DOB: 12-30-1950 DOA: 09/17/2017 PCP: Darreld Mclean, MD  HPI/Recap of past 24 hours:  Destiny Ballard is a 67 y.o. year old female with medical history significant for CAD, T2DM, HTN, dCHF, depression with anxiety who presented on 09/17/2017 with cough and progressive shortness of breath and was found to have sepsis secondary to multifocal pneumonia    Interval History Reports nonproductive cough. States grandkids as sick contacts. Does not need home oxygen.   Assessment/Plan: Principal Problem:   Lobar pneumonia ([place]) Active Problems:   Type 2 diabetes mellitus without complication, without long-term current use of insulin (HCC)   HLD (hyperlipidemia)   Depression with anxiety   CAD (coronary artery disease)   Essential hypertension   Sepsis ([place])   Chronic diastolic CHF (congestive heart failure) (HCC)   Acute respiratory failure with hypoxia (HCC)   #Acute hypoxic respiratory failure secondary to lobar pneumonia, improving Currently on 2 L oxygen (no home oxygen) -Expect improvement with continued treatment of pneumonia -Wean oxygen, goal SPO2 greater than 92% -Tessalon pearls  #Sepsis secondary to lobar pneumonia, improving On admission:T-max 90.9, tachypnea 24, tachycardic 105, lactic acid 2.5 (now resolved) Chest x-ray-right upper middle lobe multifocal pneumonia -Empiric ceftriaxone and azithromycin -Sputum culture if cough becomes productive -Follow blood cultures  #CAD Status post stent (2007) Nuclear stress test with reversible ischemic findings on 08/2017 -Recommended to reschedule her outpatient cath   #Type 2 diabetes,poorly controlled  A1c 8.2 (08/2017) -Continue Levemir 7 units,Sliding scale -Hold home metformin  #Hypertension, at goal Though admitted with sepsis no signs of low blood pressure -Continue to monitor - Home losartan and metoprolol  #Hyperlipidemia Continue  Crestor  #Peripheral neuropathy -Continue gabapentin 600 mg BID  #Depression/anxiety -home prozac and klonopin (PRN)  Code Status: Full   Family Communication: Daughter at bedside   Disposition Plan: monitor on IV antibiotics, plan to transition to oral in 24 hours   Consultants:  None  Procedures:  None   Antimicrobials:  3/ 6: ceftriaxone, azithromycin  Cultures:  3/6- blood  Telemetry: YES  DVT prophylaxis: lovenox   Objective: Vitals:   09/18/17 0106 09/18/17 0210 09/18/17 0420 09/18/17 1000  BP: (!) 147/66 (!) 153/61 (!) 146/60 (!) 151/74  Pulse: 94  (!) 101 99  Resp: 19  18 18   Temp:  99 F (37.2 C) 98.6 F (37 C) 98 F (36.7 C)  TempSrc:  Oral Oral Oral  SpO2: 96% 96% 94% 96%  Weight:  83.2 kg (183 lb 6.8 oz)    Height:  5' (1.524 m)      Intake/Output Summary (Last 24 hours) at 09/18/2017 1257 Last data filed at 09/18/2017 0900 Gross per 24 hour  Intake 2950 ml  Output 500 ml  Net 2450 ml   Filed Weights   09/17/17 1639 09/18/17 0210  Weight: 80.7 kg (178 lb) 83.2 kg (183 lb 6.8 oz)    Exam:  General: comfortably lying in bed,no apparent distres Eyes: EOMI, anicteric ENT: Oral Mucosa clear and moist Cardiovascular: regular rate and rhythm, no murmurs, rubs or gallops, no JVD or edema Respiratory: Normal respiratory effort on 2 L [place], occasional wheezing and rhonchi Abdomen: soft, non-distended, non-tender, normal bowel sounds Skin: No Rash Musculoskeletal:Good ROM, no contractures. Normal muscle tone Neurologic: Grossly no focal neuro deficit.Mental status AAOx3 Psychiatric:Appropriate affect, and mood  Data Reviewed: CBC: Recent Labs  Lab 09/15/17 1523 09/17/17 1729  WBC 14.3* 10.4  NEUTROABS  --  7.8*  HGB 12.5 11.5*  HCT 38.6 36.0  MCV 80 80.0  PLT 345 956   Basic Metabolic Panel: Recent Labs  Lab 09/15/17 1523 09/17/17 1729  NA 139 133*  K 4.2 3.5  CL 97 95*  CO2 25 28  GLUCOSE 184* 212*  BUN 14 12   CREATININE 0.72 0.73  CALCIUM 9.5 8.8*   GFR: Estimated Creatinine Clearance: 64.4 mL/min (by C-G formula based on SCr of 0.73 mg/dL). Liver Function Tests: Recent Labs  Lab 09/17/17 1729  AST 31  ALT 18  ALKPHOS 94  BILITOT 0.4  PROT 7.4  ALBUMIN 3.3*   No results for input(s): LIPASE, AMYLASE in the last 168 hours. No results for input(s): AMMONIA in the last 168 hours. Coagulation Profile: Recent Labs  Lab 09/15/17 1523  INR 0.9   Cardiac Enzymes: Recent Labs  Lab 09/17/17 2222  TROPONINI <0.03   BNP (last 3 results) No results for input(s): PROBNP in the last 8760 hours. HbA1C: No results for input(s): HGBA1C in the last 72 hours. CBG: Recent Labs  Lab 09/18/17 0213 09/18/17 0746 09/18/17 1209  GLUCAP 142* 188* 156*   Lipid Profile: No results for input(s): CHOL, HDL, LDLCALC, TRIG, CHOLHDL, LDLDIRECT in the last 72 hours. Thyroid Function Tests: No results for input(s): TSH, T4TOTAL, FREET4, T3FREE, THYROIDAB in the last 72 hours. Anemia Panel: No results for input(s): VITAMINB12, FOLATE, FERRITIN, TIBC, IRON, RETICCTPCT in the last 72 hours. Urine analysis:    Component Value Date/Time   COLORURINE YELLOW 10/12/2015 2038   APPEARANCEUR CLEAR (A) 10/12/2015 2038   LABSPEC 1.020 10/12/2015 2038   PHURINE 5.0 10/12/2015 2038   GLUCOSEU NEGATIVE 10/12/2015 2038   HGBUR NEGATIVE 10/12/2015 2038   BILIRUBINUR NEGATIVE 10/12/2015 2038   BILIRUBINUR neg 06/18/2012 1231   KETONESUR NEGATIVE 10/12/2015 2038   PROTEINUR NEGATIVE 10/12/2015 2038   UROBILINOGEN 0.2 06/18/2012 1231   NITRITE NEGATIVE 10/12/2015 2038   LEUKOCYTESUR SMALL (A) 10/12/2015 2038   Sepsis Labs: @LABRCNTIP (procalcitonin:4,lacticidven:4)  ) Recent Results (from the past 240 hour(s))  Blood culture (routine x 2)     Status: None (Preliminary result)   Collection Time: 09/17/17  5:30 PM  Result Value Ref Range Status   Specimen Description   Final    BLOOD BLOOD RIGHT  WRIST Performed at [facility], [address] [place], [place] [address]    Special Requests   Final    BOTTLES DRAWN AEROBIC AND ANAEROBIC Blood Culture adequate volume Performed at [facility], [address] [place], [place] [address]    Culture   Final    NO GROWTH < 24 HOURS Performed at [facility], [place] [address], [place], [place] [address]    Report Status PENDING  Incomplete  Blood culture (routine x 2)     Status: None (Preliminary result)   Collection Time: 09/17/17  5:30 PM  Result Value Ref Range Status   Specimen Description   Final    BLOOD RIGHT ANTECUBITAL Performed at [facility], [address] [place], [place] [address]    Special Requests   Final    BOTTLES DRAWN AEROBIC AND ANAEROBIC Blood Culture adequate volume Performed at [facility], [address] [place], [place] [address]    Culture   Final    NO GROWTH < 24 HOURS Performed at [facility], [place] [address], [place], [place] [address]    Report  Status PENDING  Incomplete      Studies: Dg Chest 2 View  Result Date: 09/17/2017 CLINICAL DATA:  Fever and congestion for 1 week EXAM: CHEST - 2 VIEW COMPARISON:  01/26/2016 FINDINGS: Cardiac shadow remains enlarged. Calcified breast implant is noted on the right and stable. Patchy infiltrative changes noted within the right upper lobe and right middle lobe consistent with acute pneumonia. Hyperinflation consistent with COPD is noted as well. IMPRESSION: Right upper and middle lobe multifocal pneumonia. COPD. Electronically Signed   By: Inez Catalina M.D.   On: 09/17/2017 19:07    Scheduled Meds: . aspirin EC  81 mg Oral Daily  . cholecalciferol  1,000 Units Oral Daily  . enoxaparin (LOVENOX) injection  40 mg Subcutaneous Q24H  . FLUoxetine  40 mg Oral Daily  . gabapentin  600 mg Oral BID  . hydrALAZINE  50 mg Oral TID  . hydrOXYzine  25 mg Oral QHS  . insulin aspart  0-9 Units Subcutaneous  TID WC  . insulin detemir  7 Units Subcutaneous Daily  . loratadine  10 mg Oral Daily  . losartan  100 mg Oral Daily  . metoprolol succinate  100 mg Oral Daily  . montelukast  10 mg Oral QHS  . omega-3 acid ethyl esters  1 capsule Oral BID  . polyvinyl alcohol  1 drop Both Eyes BID  . rosuvastatin  20 mg Oral Daily  . tiotropium  18 mcg Inhalation Daily    Continuous Infusions: . azithromycin Stopped (09/17/17 1950)  . cefTRIAXone (ROCEPHIN)  IV Stopped (09/17/17 1838)     LOS: 0 days     Desiree Hane, MD [facility] Pager [phone]  If 7PM-7AM, please contact night-coverage [email or web address] Password [facility] 09/18/2017, 12:57 PM

## 2017-09-19 LAB — BASIC METABOLIC PANEL
Anion gap: 12 (ref 5–15)
BUN: 6 mg/dL (ref 6–20)
CO2: 27 mmol/L (ref 22–32)
Calcium: 8.5 mg/dL — ABNORMAL LOW (ref 8.9–10.3)
Chloride: 96 mmol/L — ABNORMAL LOW (ref 101–111)
Creatinine, Ser: 0.67 mg/dL (ref 0.44–1.00)
GFR calc Af Amer: >60 mL/min (ref >60)
GFR calc non Af Amer: >60 mL/min (ref >60)
Glucose, Bld: 180 mg/dL — ABNORMAL HIGH (ref 65–99)
Potassium: 3.6 mmol/L (ref 3.5–5.1)
Sodium: 135 mmol/L (ref 135–145)

## 2017-09-19 LAB — GLUCOSE, CAPILLARY
Glucose-Capillary: 169 mg/dL — ABNORMAL HIGH (ref 65–99)
Glucose-Capillary: 175 mg/dL — ABNORMAL HIGH (ref 65–99)
Glucose-Capillary: 214 mg/dL — ABNORMAL HIGH (ref 65–99)
Glucose-Capillary: 231 mg/dL — ABNORMAL HIGH (ref 65–99)

## 2017-09-19 LAB — CBC
HCT: 32.4 % — ABNORMAL LOW (ref 36.0–46.0)
Hemoglobin: 10.1 g/dL — ABNORMAL LOW (ref 12.0–15.0)
MCH: 25.4 pg — ABNORMAL LOW (ref 26.0–34.0)
MCHC: 31.2 g/dL (ref 30.0–36.0)
MCV: 81.6 fL (ref 78.0–100.0)
Platelets: 197 10*3/uL (ref 150–400)
RBC: 3.97 MIL/uL (ref 3.87–5.11)
RDW: 13.9 % (ref 11.5–15.5)
WBC: 9.8 10*3/uL (ref 4.0–10.5)

## 2017-09-19 LAB — LEGIONELLA PNEUMOPHILA SEROGP 1 UR AG: L. pneumophila Serogp 1 Ur Ag: NEGATIVE

## 2017-09-19 MED ORDER — METHYLPREDNISOLONE SODIUM SUCC 125 MG IJ SOLR
60.0000 mg | Freq: Two times a day (BID) | INTRAMUSCULAR | Status: DC
  Filled 2017-09-19: qty 2

## 2017-09-19 MED ORDER — METHYLPREDNISOLONE SODIUM SUCC 125 MG IJ SOLR
60.0000 mg | Freq: Four times a day (QID) | INTRAMUSCULAR | Status: DC
  Administered 2017-09-19 – 2017-09-20 (×4): 60 mg via INTRAVENOUS
  Filled 2017-09-19 (×4): qty 2

## 2017-09-19 MED ORDER — IPRATROPIUM-ALBUTEROL 0.5-2.5 (3) MG/3ML IN SOLN
3.0000 mL | Freq: Three times a day (TID) | RESPIRATORY_TRACT | Status: DC
  Administered 2017-09-20 – 2017-09-22 (×8): 3 mL via RESPIRATORY_TRACT
  Filled 2017-09-19 (×8): qty 3

## 2017-09-19 MED ORDER — IPRATROPIUM-ALBUTEROL 0.5-2.5 (3) MG/3ML IN SOLN
3.0000 mL | Freq: Four times a day (QID) | RESPIRATORY_TRACT | Status: DC
  Administered 2017-09-19 (×2): 3 mL via RESPIRATORY_TRACT
  Filled 2017-09-19 (×2): qty 3

## 2017-09-19 NOTE — Care Management Note (Addendum)
Case Management Note  Patient Details  Name: GARIELLE MROZ MRN: [id number] Date of Birth: 02-Jul-1951  Subjective/Objective:  Admitted for Sepsis secondary to multifocal pneumonia complicated by acute exacerbation of COPD  Action/Plan: In to speak with patient, daughter at bedside.  Prior to admission patient lived at home with daughter.  Will be returning to the same living situation after discharge.   At discharge, patient has transportation home (daughter). Patient has the ability to pay for  Medication/food.  Daughter takes patient to medical appointments.  Has never used [facility].  Home DME: None.  Daughter states patient had been diagnosed with sleep apnea in the past and does not have a CPAP machine.  NCM encouraged her to discuss sleep apnea with pulmonologist. PCP noted. 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    Expected Discharge Date:    TBD             Expected Discharge Plan:  Home/Self Care  Discharge planning Services  CM Consult  Status of Service:  In process, will continue to follow  Kristen Cardinal, RN  Nurse case [facility] 09/19/2017, 12:52 PM

## 2017-09-19 NOTE — Progress Notes (Addendum)
PROGRESS NOTE  Destiny Ballard NID:[id number] DOB: May 19, 1951 DOA: 09/17/2017 PCP: Darreld Mclean, MD  HPI/Recap of past 24 hours:  Destiny Ballard is a 67 y.o. year old female with medical history significant for CAD, T2DM, HTN, dCHF, depression with anxiety who presented on 09/17/2017 with cough and progressive shortness of breath and was found to have sepsis secondary to multifocal pneumonia complicated by acute exacerbation of COPD    Interval History Breathing not much better.  Daughter at bedside reports patient was recently diagnosed with COPD for which she was started on Spiriva as an outpatient.  Assessment/Plan: Principal Problem:   Lobar pneumonia ([facility]) Active Problems:   Type 2 diabetes mellitus without complication, without long-term current use of insulin (HCC)   HLD (hyperlipidemia)   Depression with anxiety   CAD (coronary artery disease)   Essential hypertension   Sepsis (HCC)   Chronic diastolic CHF (congestive heart failure) (HCC)   Acute respiratory failure with hypoxia (HCC)   Respiratory failure with hypoxia (HCC)   #Acute hypoxic respiratory failure secondary to lobar pneumonia, improving Currently on 2 L oxygen (no home oxygen) -Expect improvement with continued treatment of pneumonia -Wean oxygen, goal SPO2 greater than 92% -Tessalon pearls -We will add IV Solu-Medrol QID and scheduled duo nebs given diagnosis of COPD. 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 Lamonte Sakai) Likely exacerbation in  setting of pneumonia -IV Solu-Medrol 60 mg TID -Scheduled duo nebs -Continue to monitor  #CAD Status post stent (2007) Nuclear stress test with reversible ischemic findings on 08/2017 -Recommended to reschedule her outpatient cath   #Type 2 diabetes,poorly controlled  A1c 8.2 (08/2017) Glucose range 170-180 in the past 24 hours -Continue Levemir 7 units,Sliding scale -Hold home metformin  #Hypertension, at goal Though admitted with sepsis no signs of low blood pressure -Continue to monitor - Home losartan and metoprolol  #Hyperlipidemia Continue Crestor  #Peripheral neuropathy -Continue gabapentin 600 mg BID  #Depression/anxiety -home prozac and klonopin (PRN)  Code Status: Full   Family Communication: Daughter at bedside   Disposition Plan: monitor on IV antibiotics, plan to transition to oral in 24 hours   Consultants:  None  Procedures:  None   Antimicrobials:  3/ 6: ceftriaxone, azithromycin  Cultures:  3/6- blood  Telemetry: YES  DVT prophylaxis: lovenox   Objective: Vitals:   09/18/17 1835 09/18/17 2159 09/19/17 0619 09/19/17 1002  BP:  (!) 164/58 (!) 142/67 133/62  Pulse:  94 86 (!) 103  Resp:  19 20 20   Temp: (!) 100.4 F (38 C) 99.5 F (37.5 C) 99.2 F (37.3 C) 99.2 F (37.3 C)  TempSrc:  Oral Oral Oral  SpO2:  94% 95% 92%  Weight:  83.1 kg (183 lb 3.2 oz)    Height:        Intake/Output Summary (Last 24 hours) at 09/19/2017 1223 Last data filed at 09/19/2017 0800 Gross per 24 hour  Intake 1370 ml  Output 1300 ml  Net 70 ml   Filed Weights   09/17/17 1639 09/18/17 0210 09/18/17 2159  Weight: 80.7 kg (178 lb) 83.2 kg (183 lb 6.8 oz) 83.1 kg (183  lb 3.2 oz)    Exam:  General: comfortably lying in bed,no apparent distress Eyes: EOMI, ENT: Oral Mucosa clear and moist Cardiovascular: regular rate and rhythm, no murmurs, rubs or gallops, no edema Respiratory: Normal respiratory effort on 2 L [place], decreased breath sounds  throughout, expiratory and expiratory wheezing lung fields Abdomen: soft, non-distended, non-tender, normal bowel sounds Skin: No Rash Musculoskeletal:Good ROM, no contractures. Normal muscle tone Neurologic: Grossly no focal neuro deficit.Mental status AAOx3 Psychiatric:Appropriate affect, and mood  Data Reviewed: CBC: Recent Labs  Lab 09/15/17 1523 09/17/17 1729 09/19/17 0631  WBC 14.3* 10.4 9.8  NEUTROABS  --  7.8*  --   HGB 12.5 11.5* 10.1*  HCT 38.6 36.0 32.4*  MCV 80 80.0 81.6  PLT 345 239 240   Basic Metabolic Panel: Recent Labs  Lab 09/15/17 1523 09/17/17 1729 09/19/17 0631  NA 139 133* 135  K 4.2 3.5 3.6  CL 97 95* 96*  CO2 25 28 27   GLUCOSE 184* 212* 180*  BUN 14 12 6   CREATININE 0.72 0.73 0.67  CALCIUM 9.5 8.8* 8.5*   GFR: Estimated Creatinine Clearance: 64.3 mL/min (by C-G formula based on SCr of 0.67 mg/dL). Liver Function Tests: Recent Labs  Lab 09/17/17 1729  AST 31  ALT 18  ALKPHOS 94  BILITOT 0.4  PROT 7.4  ALBUMIN 3.3*   No results for input(s): LIPASE, AMYLASE in the last 168 hours. No results for input(s): AMMONIA in the last 168 hours. Coagulation Profile: Recent Labs  Lab 09/15/17 1523  INR 0.9   Cardiac Enzymes: Recent Labs  Lab 09/17/17 2222  TROPONINI <0.03   BNP (last 3 results) No results for input(s): PROBNP in the last 8760 hours. HbA1C: No results for input(s): HGBA1C in the last 72 hours. CBG: Recent Labs  Lab 09/18/17 1209 09/18/17 1617 09/18/17 2157 09/19/17 0726 09/19/17 1159  GLUCAP 156* 187* 145* 169* 175*   Lipid Profile: No results for input(s): CHOL, HDL, LDLCALC, TRIG, CHOLHDL, LDLDIRECT in the last 72 hours. Thyroid Function Tests: No results for input(s): TSH, T4TOTAL, FREET4, T3FREE, THYROIDAB in the last 72 hours. Anemia Panel: No results for input(s): VITAMINB12, FOLATE, FERRITIN, TIBC, IRON, RETICCTPCT in the last 72 hours. Urine analysis:    Component Value Date/Time   COLORURINE  YELLOW 10/12/2015 2038   APPEARANCEUR CLEAR (A) 10/12/2015 2038   LABSPEC 1.020 10/12/2015 2038   PHURINE 5.0 10/12/2015 2038   GLUCOSEU NEGATIVE 10/12/2015 2038   HGBUR NEGATIVE 10/12/2015 2038   BILIRUBINUR NEGATIVE 10/12/2015 2038   BILIRUBINUR neg 06/18/2012 1231   KETONESUR NEGATIVE 10/12/2015 2038   PROTEINUR NEGATIVE 10/12/2015 2038   UROBILINOGEN 0.2 06/18/2012 1231   NITRITE NEGATIVE 10/12/2015 2038   LEUKOCYTESUR SMALL (A) 10/12/2015 2038   Sepsis Labs: @LABRCNTIP (procalcitonin:4,lacticidven:4)  ) Recent Results (from the past 240 hour(s))  Blood culture (routine x 2)     Status: None (Preliminary result)   Collection Time: 09/17/17  5:30 PM  Result Value Ref Range Status   Specimen Description   Final    BLOOD BLOOD RIGHT WRIST Performed at [facility], [address] [place], [place] [address]    Special Requests   Final    BOTTLES DRAWN AEROBIC AND ANAEROBIC Blood Culture adequate volume Performed at [facility], [address] [place], [place] [address]    Culture   Final    NO GROWTH < 24 HOURS Performed at [facility], [place] [address] [place], [place] [address]  Report Status PENDING  Incomplete  Blood culture (routine x 2)     Status: None (Preliminary result)   Collection Time: 09/17/17  5:30 PM  Result Value Ref Range Status   Specimen Description   Final    BLOOD RIGHT ANTECUBITAL Performed at [facility], [address] [place], [place] [address]    Special Requests   Final    BOTTLES DRAWN AEROBIC AND ANAEROBIC Blood Culture adequate volume Performed at [facility] [address] [place], [place] [address]    Culture   Final    NO GROWTH < 24 HOURS Performed at [facility], [place] [address], [place], [place] [address]    Report Status PENDING  Incomplete  Culture, sputum-assessment     Status: None (Preliminary result)   Collection Time: 09/18/17 11:30 PM  Result Value Ref  Range Status   Specimen Description EXPECTORATED SPUTUM  Final   Special Requests NONE  Final   Sputum evaluation   Final    THIS SPECIMEN IS ACCEPTABLE FOR SPUTUM CULTURE Performed at [facility], [place] [address], [place], [place] [address]    Report Status PENDING  Incomplete  Culture, respiratory (NON-Expectorated)     Status: None (Preliminary result)   Collection Time: 09/18/17 11:30 PM  Result Value Ref Range Status   Specimen Description EXPECTORATED SPUTUM  Final   Special Requests NONE Reflexed from [id number]  Final   Gram Stain   Final    RARE WBC PRESENT, PREDOMINANTLY PMN RARE SQUAMOUS EPITHELIAL CELLS PRESENT FEW GRAM POSITIVE COCCI RARE GRAM NEGATIVE RODS RARE GRAM POSITIVE RODS Performed at [facility], [place] [address] [place], [place] [address]    Culture PENDING  Incomplete   Report Status PENDING  Incomplete      Studies: No results found.  Scheduled Meds: . aspirin EC  81 mg Oral Daily  . cholecalciferol  1,000 Units Oral Daily  . enoxaparin (LOVENOX) injection  40 mg Subcutaneous Q24H  . FLUoxetine  40 mg Oral Daily  . gabapentin  600 mg Oral BID  . hydrALAZINE  50 mg Oral TID  . hydrOXYzine  25 mg Oral QHS  . insulin aspart  0-9 Units Subcutaneous TID WC  . insulin detemir  7 Units Subcutaneous Daily  . ipratropium-albuterol  3 mL Nebulization Q6H  . loratadine  10 mg Oral Daily  . losartan  100 mg Oral Daily  . methylPREDNISolone (SOLU-MEDROL) injection  60 mg Intravenous Q12H  . metoprolol succinate  100 mg Oral Daily  . montelukast  10 mg Oral QHS  . omega-3 acid ethyl esters  1 capsule Oral BID  . polyvinyl alcohol  1 drop Both Eyes BID  . rosuvastatin  20 mg Oral Daily    Continuous Infusions: . azithromycin Stopped (09/18/17 1945)  . cefTRIAXone (ROCEPHIN)  IV Stopped (09/18/17 2018)     LOS: 1 day     Desiree Hane, MD [facility] Pager [phone]  If 7PM-7AM, please contact  night-coverage [email or web address] Password [facility] 09/19/2017, 12:23 PM

## 2017-09-20 LAB — BASIC METABOLIC PANEL
Anion gap: 13 (ref 5–15)
BUN: 11 mg/dL (ref 6–20)
CO2: 27 mmol/L (ref 22–32)
Calcium: 8.9 mg/dL (ref 8.9–10.3)
Chloride: 96 mmol/L — ABNORMAL LOW (ref 101–111)
Creatinine, Ser: 0.6 mg/dL (ref 0.44–1.00)
GFR calc Af Amer: >60 mL/min (ref >60)
GFR calc non Af Amer: >60 mL/min (ref >60)
Glucose, Bld: 263 mg/dL — ABNORMAL HIGH (ref 65–99)
Potassium: 4.3 mmol/L (ref 3.5–5.1)
Sodium: 136 mmol/L (ref 135–145)

## 2017-09-20 LAB — CBC
HCT: 33.3 % — ABNORMAL LOW (ref 36.0–46.0)
Hemoglobin: 10.5 g/dL — ABNORMAL LOW (ref 12.0–15.0)
MCH: 25.4 pg — ABNORMAL LOW (ref 26.0–34.0)
MCHC: 31.5 g/dL (ref 30.0–36.0)
MCV: 80.6 fL (ref 78.0–100.0)
Platelets: 199 10*3/uL (ref 150–400)
RBC: 4.13 MIL/uL (ref 3.87–5.11)
RDW: 14.1 % (ref 11.5–15.5)
WBC: 9.5 10*3/uL (ref 4.0–10.5)

## 2017-09-20 LAB — EXPECTORATED SPUTUM ASSESSMENT W GRAM STAIN, RFLX TO RESP C

## 2017-09-20 LAB — GLUCOSE, CAPILLARY
Glucose-Capillary: 251 mg/dL — ABNORMAL HIGH (ref 65–99)
Glucose-Capillary: 390 mg/dL — ABNORMAL HIGH (ref 65–99)
Glucose-Capillary: 394 mg/dL — ABNORMAL HIGH (ref 65–99)
Glucose-Capillary: 465 mg/dL — ABNORMAL HIGH (ref 65–99)

## 2017-09-20 LAB — EXPECTORATED SPUTUM ASSESSMENT W REFEX TO RESP CULTURE

## 2017-09-20 MED ORDER — BENZONATATE 100 MG PO CAPS
100.0000 mg | ORAL_CAPSULE | Freq: Two times a day (BID) | ORAL | Status: DC
  Administered 2017-09-20 – 2017-09-22 (×5): 100 mg via ORAL
  Filled 2017-09-20 (×5): qty 1

## 2017-09-20 MED ORDER — INSULIN ASPART 100 UNIT/ML IV SOLN
12.0000 [IU] | Freq: Once | INTRAVENOUS | Status: AC
  Administered 2017-09-20: 12 [IU] via SUBCUTANEOUS

## 2017-09-20 MED ORDER — METHYLPREDNISOLONE SODIUM SUCC 125 MG IJ SOLR
60.0000 mg | Freq: Two times a day (BID) | INTRAMUSCULAR | Status: DC
  Administered 2017-09-21: 60 mg via INTRAVENOUS
  Filled 2017-09-20: qty 2

## 2017-09-20 MED ORDER — AZITHROMYCIN 500 MG PO TABS
500.0000 mg | ORAL_TABLET | Freq: Every day | ORAL | Status: DC
  Administered 2017-09-21 – 2017-09-22 (×2): 500 mg via ORAL
  Filled 2017-09-20 (×2): qty 1

## 2017-09-20 MED ORDER — CEFPODOXIME PROXETIL 200 MG PO TABS
200.0000 mg | ORAL_TABLET | Freq: Two times a day (BID) | ORAL | Status: DC
  Administered 2017-09-20 – 2017-09-22 (×4): 200 mg via ORAL
  Filled 2017-09-20 (×5): qty 1

## 2017-09-20 NOTE — Progress Notes (Signed)
PROGRESS NOTE  Destiny Ballard LGX:[id number] DOB: 12-25-1950 DOA: 09/17/2017 PCP: Darreld Mclean, MD  HPI/Recap of past 24 hours:  Destiny Ballard is a 67 y.o. year old female with medical history significant for CAD, T2DM, HTN, dCHF, depression with anxiety who presented on 09/17/2017 with cough and progressive shortness of breath and was found to have sepsis secondary to multifocal pneumonia complicated by acute exacerbation of COPD   Interval History Is much better this morning after starting IV Solu-Medrol and scheduled inhalers.  Daughters at bedside also report improvement in clinical status and breathing.  Assessment/Plan: Principal Problem:   Lobar pneumonia (HCC) Active Problems:   Type 2 diabetes mellitus without complication, without long-term current use of insulin (HCC)   HLD (hyperlipidemia)   Depression with anxiety   CAD (coronary artery disease)   Essential hypertension   COPD with acute exacerbation (HCC)   Sepsis (HCC)   Chronic diastolic CHF (congestive heart failure) (HCC)   Acute respiratory failure with hypoxia (HCC)   Respiratory failure with hypoxia (HCC)   #Acute hypoxic respiratory failure secondary to lobar pneumonia, improving Currently on 2 L oxygen (no home oxygen) -Expect improvement with continued treatment of pneumonia -Strep pneumo, Legionella, influenza panel negative -Wean oxygen, goal SPO2 greater than 92% -Tessalon pearls -Transition to oral Vantin and azithromycin -De-escalate IV Solu-Medrol twice daily, continue scheduled duo nebs, oral antibiotics -Incentive spirometry, flutter valve, out of bed to chair  #Sepsis secondary to lobar pneumonia, resolved On admission:T-max 90.9, tachypnea 24, tachycardic 105, lactic acid 2.5 (now resolved) Chest x-ray-right upper middle lobe multifocal pneumonia - oral Vantin and azithromycin -Sputum culture if cough becomes productive -Follow blood cultures  #Obstructive lung disease, acute  exacerbation in setting of pneumonia, improving 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 Lamonte Sakai) Exacerbation in setting of pneumonia -IV Solu-Medrol 60 mg twice daily -Vantin and azithromycin -Scheduled duo nebs -Continue to monitor  #CAD Status post stent (2007) Nuclear stress test with reversible ischemic findings on 08/2017 -Recommended to reschedule her outpatient cath   #Type 2 diabetes,poorly controlled  A1c 8.2 (08/2017) Worsening in the setting of IV steroid use -Hold home metformin -Continue Levemir 7 units,Sliding scale, correction coverage  #Recently diagnosed obstructive sleep apnea -Tolerating intermittent use of CPAP at home -Amenable to trial of CPAP here nightly  #Hypertension, at goal Sepsis has resolved.  Blood pressure has maintain normal values -Continue to monitor - Home losartan and metoprolol  #Hyperlipidemia Continue Crestor  #Peripheral neuropathy -Continue gabapentin 600 mg BID  #Depression/anxiety -home prozac and klonopin (PRN)  Code Status: Full   Family Communication: Daughters updated at bedside   Disposition Plan: TSH oral antibiotics, plan to transition to oral in 24 hours   Consultants:  None  Procedures:  None   Antimicrobials:  3/ 6: ceftriaxone, azithromycin  Cultures:  3/6- blood  Telemetry: YES  DVT prophylaxis: lovenox   Objective: Vitals:   09/20/17 0555 09/20/17 0836 09/20/17 0856 09/20/17 1035  BP: 138/66 (!) 113/59  (!) 151/64  Pulse: 78 83  99  Resp: 20 18    Temp: 98.7 F (37.1 C) 98.4 F (36.9 C)    TempSrc: Oral Oral    SpO2: 97% 95% 97%   Weight:      Height:        Intake/Output Summary (Last 24 hours) at 09/20/2017 1419 Last data filed at 09/20/2017 0900 Gross per 24 hour  Intake 590 ml  Output -  Net 590 ml  Filed Weights   09/17/17 1639 09/18/17 0210 09/18/17 2159  Weight: 80.7 kg (178 lb) 83.2 kg (183 lb 6.8 oz) 83.1 kg (183 lb 3.2 oz)     Exam:  General: comfortably lying in bed,no apparent distress Eyes: EOMI, ENT: Oral Mucosa clear and moist Cardiovascular: regular rate and rhythm, no murmurs, rubs or gallops, no edema Respiratory: Normal respiratory effort on 2 L [place], improved breath sounds throughout both lung fields, no wheezing  Abdomen: soft, non-distended, non-tender, normal bowel sounds Skin: No Rash Musculoskeletal:Good ROM, no contractures. Normal muscle tone Neurologic: Grossly no focal neuro deficit.Mental status AAOx3 Psychiatric:Appropriate affect, and mood  Data Reviewed: CBC: Recent Labs  Lab 09/15/17 1523 09/17/17 1729 09/19/17 0631 09/20/17 0442  WBC 14.3* 10.4 9.8 9.5  NEUTROABS  --  7.8*  --   --   HGB 12.5 11.5* 10.1* 10.5*  HCT 38.6 36.0 32.4* 33.3*  MCV 80 80.0 81.6 80.6  PLT 345 239 197 202   Basic Metabolic Panel: Recent Labs  Lab 09/15/17 1523 09/17/17 1729 09/19/17 0631 09/20/17 0442  NA 139 133* 135 136  K 4.2 3.5 3.6 4.3  CL 97 95* 96* 96*  CO2 25 28 27 27   GLUCOSE 184* 212* 180* 263*  BUN 14 12 6 11   CREATININE 0.72 0.73 0.67 0.60  CALCIUM 9.5 8.8* 8.5* 8.9   GFR: Estimated Creatinine Clearance: 64.3 mL/min (by C-G formula based on SCr of 0.6 mg/dL). Liver Function Tests: Recent Labs  Lab 09/17/17 1729  AST 31  ALT 18  ALKPHOS 94  BILITOT 0.4  PROT 7.4  ALBUMIN 3.3*   No results for input(s): LIPASE, AMYLASE in the last 168 hours. No results for input(s): AMMONIA in the last 168 hours. Coagulation Profile: Recent Labs  Lab 09/15/17 1523  INR 0.9   Cardiac Enzymes: Recent Labs  Lab 09/17/17 2222  TROPONINI <0.03   BNP (last 3 results) No results for input(s): PROBNP in the last 8760 hours. HbA1C: No results for input(s): HGBA1C in the last 72 hours. CBG: Recent Labs  Lab 09/19/17 1159 09/19/17 1712 09/19/17 2125 09/20/17 0742 09/20/17 1135  GLUCAP 175* 214* 231* 251* 390*   Lipid Profile: No results for input(s): CHOL, HDL,  LDLCALC, TRIG, CHOLHDL, LDLDIRECT in the last 72 hours. Thyroid Function Tests: No results for input(s): TSH, T4TOTAL, FREET4, T3FREE, THYROIDAB in the last 72 hours. Anemia Panel: No results for input(s): VITAMINB12, FOLATE, FERRITIN, TIBC, IRON, RETICCTPCT in the last 72 hours. Urine analysis:    Component Value Date/Time   COLORURINE YELLOW 10/12/2015 2038   APPEARANCEUR CLEAR (A) 10/12/2015 2038   LABSPEC 1.020 10/12/2015 2038   PHURINE 5.0 10/12/2015 2038   GLUCOSEU NEGATIVE 10/12/2015 2038   HGBUR NEGATIVE 10/12/2015 2038   BILIRUBINUR NEGATIVE 10/12/2015 2038   BILIRUBINUR neg 06/18/2012 1231   KETONESUR NEGATIVE 10/12/2015 2038   PROTEINUR NEGATIVE 10/12/2015 2038   UROBILINOGEN 0.2 06/18/2012 1231   NITRITE NEGATIVE 10/12/2015 2038   LEUKOCYTESUR SMALL (A) 10/12/2015 2038   Sepsis Labs: @LABRCNTIP (procalcitonin:4,lacticidven:4)  ) Recent Results (from the past 240 hour(s))  Blood culture (routine x 2)     Status: None (Preliminary result)   Collection Time: 09/17/17  5:30 PM  Result Value Ref Range Status   Specimen Description   Final    BLOOD BLOOD RIGHT WRIST Performed at [facility], [place] [address] [place], [place] [address]    Special Requests   Final    BOTTLES DRAWN AEROBIC AND ANAEROBIC Blood  Culture adequate volume Performed at [facility], [address] [place], [place] [address]    Culture   Final    NO GROWTH 3 DAYS Performed at [facility], [place] [address] [place], [place] [address]    Report Status PENDING  Incomplete  Blood culture (routine x 2)     Status: None (Preliminary result)   Collection Time: 09/17/17  5:30 PM  Result Value Ref Range Status   Specimen Description   Final    BLOOD RIGHT ANTECUBITAL Performed at [facility], [place] [place], [place] [address]    Special Requests   Final    BOTTLES DRAWN AEROBIC AND ANAEROBIC Blood Culture adequate volume Performed at [facility], [address] [place], [place] [address]    Culture   Final    NO GROWTH 3 DAYS Performed at [facility], [place] [address], [place], [place] [address]    Report Status PENDING  Incomplete  Culture, sputum-assessment     Status: None (Preliminary result)   Collection Time: 09/18/17 11:30 PM  Result Value Ref Range Status   Specimen Description EXPECTORATED SPUTUM  Final   Special Requests NONE  Final   Sputum evaluation   Final    THIS SPECIMEN IS ACCEPTABLE FOR SPUTUM CULTURE Performed at [facility], [place] [address] [place], [place] [address]    Report Status PENDING  Incomplete  Culture, respiratory (NON-Expectorated)     Status: None (Preliminary result)   Collection Time: 09/18/17 11:30 PM  Result Value Ref Range Status   Specimen Description EXPECTORATED SPUTUM  Final   Special Requests NONE Reflexed from [id number]  Final   Gram Stain   Final    RARE WBC PRESENT, PREDOMINANTLY PMN RARE SQUAMOUS EPITHELIAL CELLS PRESENT FEW GRAM POSITIVE COCCI RARE GRAM NEGATIVE RODS RARE GRAM POSITIVE RODS    Culture   Final    CULTURE REINCUBATED FOR BETTER GROWTH Performed at [facility], [place] [address], [place], [place] [address]    Report Status PENDING  Incomplete      Studies: No results found.  Scheduled Meds: . aspirin EC  81 mg Oral Daily  . cholecalciferol  1,000 Units Oral Daily  . enoxaparin (LOVENOX) injection  40 mg Subcutaneous Q24H  . FLUoxetine  40 mg Oral Daily  . gabapentin  600 mg Oral BID  . hydrALAZINE  50 mg Oral TID  . hydrOXYzine  25 mg Oral QHS  . insulin aspart  0-9 Units Subcutaneous TID WC  . insulin detemir  7 Units Subcutaneous Daily  . ipratropium-albuterol  3 mL Nebulization TID  . loratadine  10 mg Oral Daily  . losartan  100 mg Oral Daily  . [START ON 09/21/2017] methylPREDNISolone (SOLU-MEDROL) injection  60 mg Intravenous Q12H  . metoprolol succinate  100 mg Oral Daily  . montelukast  10 mg Oral QHS  .  omega-3 acid ethyl esters  1 capsule Oral BID  . polyvinyl alcohol  1 drop Both Eyes BID  . rosuvastatin  20 mg Oral Daily    Continuous Infusions: . azithromycin 500 mg (09/19/17 1753)  . cefTRIAXone (ROCEPHIN)  IV 1 g (09/19/17 1751)     LOS: 2 days     Desiree Hane, MD [facility] Pager [phone]  If 7PM-7AM, please contact night-coverage [email or web address] Password [email or web address] 09/20/2017, 2:19 PM

## 2017-09-20 NOTE — Progress Notes (Signed)
Patient refused CPAP for the night  

## 2017-09-21 LAB — BASIC METABOLIC PANEL
Anion gap: 10 (ref 5–15)
BUN: 22 mg/dL — ABNORMAL HIGH (ref 6–20)
CO2: 31 mmol/L (ref 22–32)
Calcium: 9.5 mg/dL (ref 8.9–10.3)
Chloride: 96 mmol/L — ABNORMAL LOW (ref 101–111)
Creatinine, Ser: 0.77 mg/dL (ref 0.44–1.00)
GFR calc Af Amer: >60 mL/min (ref >60)
GFR calc non Af Amer: >60 mL/min (ref >60)
Glucose, Bld: 329 mg/dL — ABNORMAL HIGH (ref 65–99)
Potassium: 4.3 mmol/L (ref 3.5–5.1)
Sodium: 137 mmol/L (ref 135–145)

## 2017-09-21 LAB — GLUCOSE, CAPILLARY
Glucose-Capillary: 334 mg/dL — ABNORMAL HIGH (ref 65–99)
Glucose-Capillary: 454 mg/dL — ABNORMAL HIGH (ref 65–99)
Glucose-Capillary: 392 mg/dL — ABNORMAL HIGH (ref 65–99)
Glucose-Capillary: 401 mg/dL — ABNORMAL HIGH (ref 65–99)
Glucose-Capillary: 470 mg/dL — ABNORMAL HIGH (ref 65–99)

## 2017-09-21 LAB — CULTURE, RESPIRATORY: Culture: NORMAL

## 2017-09-21 LAB — CBC
HCT: 31.9 % — ABNORMAL LOW (ref 36.0–46.0)
Hemoglobin: 10.3 g/dL — ABNORMAL LOW (ref 12.0–15.0)
MCH: 25.6 pg — ABNORMAL LOW (ref 26.0–34.0)
MCHC: 32.3 g/dL (ref 30.0–36.0)
MCV: 79.4 fL (ref 78.0–100.0)
Platelets: UNDETERMINED 10*3/uL (ref 150–400)
RBC: 4.02 MIL/uL (ref 3.87–5.11)
RDW: 14 % (ref 11.5–15.5)
WBC: 11.9 10*3/uL — ABNORMAL HIGH (ref 4.0–10.5)

## 2017-09-21 MED ORDER — INSULIN DETEMIR 100 UNIT/ML ~~LOC~~ SOLN
12.0000 [IU] | Freq: Every day | SUBCUTANEOUS | Status: DC
  Administered 2017-09-21 – 2017-09-22 (×2): 12 [IU] via SUBCUTANEOUS
  Filled 2017-09-21 (×2): qty 0.12

## 2017-09-21 MED ORDER — METHYLPREDNISOLONE 32 MG PO TABS
32.0000 mg | ORAL_TABLET | Freq: Every day | ORAL | Status: DC
  Filled 2017-09-21: qty 1

## 2017-09-21 MED ORDER — METHYLPREDNISOLONE 16 MG PO TABS
32.0000 mg | ORAL_TABLET | Freq: Every day | ORAL | Status: DC
  Administered 2017-09-21 – 2017-09-22 (×2): 32 mg via ORAL
  Filled 2017-09-21 (×2): qty 2

## 2017-09-21 MED ORDER — INSULIN ASPART 100 UNIT/ML ~~LOC~~ SOLN
10.0000 [IU] | Freq: Once | SUBCUTANEOUS | Status: AC
  Administered 2017-09-21: 10 [IU] via SUBCUTANEOUS

## 2017-09-21 NOTE — Progress Notes (Signed)
MD aware of CBG, no additional insulin orders

## 2017-09-21 NOTE — Progress Notes (Signed)
PROGRESS NOTE  Destiny Ballard WCH:[id number] DOB: September 19, 1950 DOA: 09/17/2017 PCP: Darreld Mclean, MD  HPI/Recap of past 24 hours:  Destiny Ballard is a 67 y.o. year old female with medical history significant for CAD, T2DM, HTN, dCHF, depression with anxiety who presented on 09/17/2017 with cough and progressive shortness of breath and was found to have sepsis secondary to multifocal pneumonia complicated by acute exacerbation of COPD   Interval History Continues to feel very well this morning.  Tolerating incentive spirometry.  Still coughing  Assessment/Plan: Principal Problem:   Lobar pneumonia (HCC) Active Problems:   Type 2 diabetes mellitus without complication, without long-term current use of insulin (HCC)   HLD (hyperlipidemia)   Depression with anxiety   CAD (coronary artery disease)   Essential hypertension   COPD with acute exacerbation (HCC)   Sepsis (HCC)   Chronic diastolic CHF (congestive heart failure) (HCC)   Acute respiratory failure with hypoxia (HCC)   Respiratory failure with hypoxia (HCC)   #Acute hypoxic respiratory failure secondary to lobar pneumonia, improving Currently on 2 L oxygen (no home oxygen) -Strep pneumo, Legionella, influenza panel negative -Wean oxygen, goal SPO2 greater than 92%, walking oxygen test today -Tessalon pearls -oral Vantin and azithromycin -De-escalate to oral methylprednisolone, scheduled duo nebs, oral antibiotics -Incentive spirometry, flutter valve, out of bed to chair  #Sepsis secondary to lobar pneumonia, resolved On admission:T-max 90.9, tachypnea 24, tachycardic 105, lactic acid 2.5 (now resolved) Chest x-ray-right upper middle lobe multifocal pneumonia Sputum culture normal respiratory Aamiyah - oral Vantin and azithromycin -Follow blood cultures  #Obstructive lung disease, acute exacerbation in setting of pneumonia, improving 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  Lamonte Sakai) Exacerbation in setting of pneumonia improving with below treatment -IV Solu-Medrol 60 mg twice daily -Vantin and azithromycin -Scheduled duo nebs -Continue to monitor  #CAD Status post stent (2007) Nuclear stress test with reversible ischemic findings on 08/2017 -Recommended to reschedule her outpatient cath   #Type 2 diabetes,poorly controlled  A1c 8.2 (08/2017) Worsening in the setting of IV steroid use -Hold home metformin -increased to Levemir 12 units,Sliding scale, correction coverage  #Recently diagnosed obstructive sleep apnea -Tolerating intermittent use of CPAP at home -declined CPAP here  #Hypertension, at goal Sepsis has resolved.  Blood pressure has maintain normal values -Continue to monitor - Home losartan and metoprolol  #Hyperlipidemia Continue Crestor  #Peripheral neuropathy -Continue gabapentin 600 mg BID  #Depression/anxiety -home prozac and klonopin (PRN)  Code Status: Full   Family Communication: Daughter updated at bedside   Disposition Plan:  oral antibiotics, monitor on oral methylprednisolone, likely dc in 24 hours  Consultants:  None  Procedures:  None   Antimicrobials:  3/ 6: ceftriaxone, azithromycin  3/9: Vantin, azithromycin  Cultures:  3/6- blood  Sputum culture negative  Telemetry: YES  DVT prophylaxis: lovenox   Objective: Vitals:   09/20/17 2207 09/21/17 0539 09/21/17 0900 09/21/17 0915  BP: (!) 152/62 137/67  (!) 170/79  Pulse: 91 83  87  Resp: 18 18  18   Temp: 97.7 F (36.5 C) 97.9 F (36.6 C)  97.9 F (36.6 C)  TempSrc: Oral Oral  Oral  SpO2: 94% 95% 95% 94%  Weight: 82.4 kg (181 lb 9.6 oz)     Height:        Intake/Output Summary (Last 24 hours) at 09/21/2017 1155 Last data filed at 09/21/2017 0600 Gross per 24 hour  Intake 360 ml  Output 0 ml  Net 360 ml  Filed Weights   09/18/17 0210 09/18/17 2159 09/20/17 2207  Weight: 83.2 kg (183 lb 6.8 oz) 83.1 kg (183 lb 3.2 oz) 82.4 kg (181  lb 9.6 oz)    Exam:  General: comfortably l sitting on side of bed Eyes: EOMI, ENT: Oral Mucosa clear and moist Cardiovascular: regular rate and rhythm, no murmurs, rubs or gallops, no edema Respiratory: Normal respiratory effort on 2 L [place], improved breath sounds throughout both lung fields, occasional wheezing Abdomen: soft, non-distended, non-tender, normal bowel sounds Skin: No Rash Musculoskeletal:Good ROM, no contractures. Normal muscle tone Neurologic: Grossly no focal neuro deficit.Mental status AAOx3 Psychiatric:Appropriate affect, and mood  Data Reviewed: CBC: Recent Labs  Lab 09/15/17 1523 09/17/17 1729 09/19/17 0631 09/20/17 0442 09/21/17 0420  WBC 14.3* 10.4 9.8 9.5 11.9*  NEUTROABS  --  7.8*  --   --   --   HGB 12.5 11.5* 10.1* 10.5* 10.3*  HCT 38.6 36.0 32.4* 33.3* 31.9*  MCV 80 80.0 81.6 80.6 79.4  PLT 345 239 197 199 PLATELET CLUMPS NOTED ON SMEAR, UNABLE TO ESTIMATE   Basic Metabolic Panel: Recent Labs  Lab 09/15/17 1523 09/17/17 1729 09/19/17 0631 09/20/17 0442 09/21/17 0420  NA 139 133* 135 136 137  K 4.2 3.5 3.6 4.3 4.3  CL 97 95* 96* 96* 96*  CO2 25 28 27 27 31   GLUCOSE 184* 212* 180* 263* 329*  BUN 14 12 6 11  22*  CREATININE 0.72 0.73 0.67 0.60 0.77  CALCIUM 9.5 8.8* 8.5* 8.9 9.5   GFR: Estimated Creatinine Clearance: 64.1 mL/min (by C-G formula based on SCr of 0.77 mg/dL). Liver Function Tests: Recent Labs  Lab 09/17/17 1729  AST 31  ALT 18  ALKPHOS 94  BILITOT 0.4  PROT 7.4  ALBUMIN 3.3*   No results for input(s): LIPASE, AMYLASE in the last 168 hours. No results for input(s): AMMONIA in the last 168 hours. Coagulation Profile: Recent Labs  Lab 09/15/17 1523  INR 0.9   Cardiac Enzymes: Recent Labs  Lab 09/17/17 2222  TROPONINI <0.03   BNP (last 3 results) No results for input(s): PROBNP in the last 8760 hours. HbA1C: No results for input(s): HGBA1C in the last 72 hours. CBG: Recent Labs  Lab 09/20/17 0742  09/20/17 1135 09/20/17 1717 09/20/17 2203 09/21/17 0757  GLUCAP 251* 390* 394* 465* 334*   Lipid Profile: No results for input(s): CHOL, HDL, LDLCALC, TRIG, CHOLHDL, LDLDIRECT in the last 72 hours. Thyroid Function Tests: No results for input(s): TSH, T4TOTAL, FREET4, T3FREE, THYROIDAB in the last 72 hours. Anemia Panel: No results for input(s): VITAMINB12, FOLATE, FERRITIN, TIBC, IRON, RETICCTPCT in the last 72 hours. Urine analysis:    Component Value Date/Time   COLORURINE YELLOW 10/12/2015 2038   APPEARANCEUR CLEAR (A) 10/12/2015 2038   LABSPEC 1.020 10/12/2015 2038   PHURINE 5.0 10/12/2015 2038   GLUCOSEU NEGATIVE 10/12/2015 2038   HGBUR NEGATIVE 10/12/2015 2038   BILIRUBINUR NEGATIVE 10/12/2015 2038   BILIRUBINUR neg 06/18/2012 1231   KETONESUR NEGATIVE 10/12/2015 2038   PROTEINUR NEGATIVE 10/12/2015 2038   UROBILINOGEN 0.2 06/18/2012 1231   NITRITE NEGATIVE 10/12/2015 2038   LEUKOCYTESUR SMALL (A) 10/12/2015 2038   Sepsis Labs: @LABRCNTIP (procalcitonin:4,lacticidven:4)  ) Recent Results (from the past 240 hour(s))  Blood culture (routine x 2)     Status: None (Preliminary result)   Collection Time: 09/17/17  5:30 PM  Result Value Ref Range Status   Specimen Description   Final    BLOOD BLOOD RIGHT WRIST Performed at  [facility], [address] [place], [place] [address]    Special Requests   Final    BOTTLES DRAWN AEROBIC AND ANAEROBIC Blood Culture adequate volume Performed at [facility], [address] [place], [place] [address]    Culture   Final    NO GROWTH 3 DAYS Performed at [facility], [place] [address], [place], [place] [address]    Report Status PENDING  Incomplete  Blood culture (routine x 2)     Status: None (Preliminary result)   Collection Time: 09/17/17  5:30 PM  Result Value Ref Range Status   Specimen Description   Final    BLOOD RIGHT ANTECUBITAL Performed at [facility], [address]  [place], [place] [address]    Special Requests   Final    BOTTLES DRAWN AEROBIC AND ANAEROBIC Blood Culture adequate volume Performed at [facility], [address] [place], [place] [address]    Culture   Final    NO GROWTH 3 DAYS Performed at [facility], [place] [address] [place], [place] [address]    Report Status PENDING  Incomplete  Culture, sputum-assessment     Status: None   Collection Time: 09/18/17 11:30 PM  Result Value Ref Range Status   Specimen Description EXPECTORATED SPUTUM  Final   Special Requests NONE  Final   Sputum evaluation   Final    THIS SPECIMEN IS ACCEPTABLE FOR SPUTUM CULTURE Performed at [facility], [place] [address] [place], [place] [address]    Report Status 09/20/2017 FINAL  Final  Culture, respiratory (NON-Expectorated)     Status: None   Collection Time: 09/18/17 11:30 PM  Result Value Ref Range Status   Specimen Description EXPECTORATED SPUTUM  Final   Special Requests NONE Reflexed from [id number]  Final   Gram Stain   Final    RARE WBC PRESENT, PREDOMINANTLY PMN RARE SQUAMOUS EPITHELIAL CELLS PRESENT FEW GRAM POSITIVE COCCI RARE GRAM NEGATIVE RODS RARE GRAM POSITIVE RODS    Culture   Final    MODERATE Consistent with normal respiratory Kaelynn. Performed at [facility], [place] [address], [place], [place] [address]    Report Status 09/21/2017 FINAL  Final      Studies: No results found.  Scheduled Meds: . aspirin EC  81 mg Oral Daily  . azithromycin  500 mg Oral Daily  . benzonatate  100 mg Oral BID  . cefpodoxime  200 mg Oral Q12H  . cholecalciferol  1,000 Units Oral Daily  . enoxaparin (LOVENOX) injection  40 mg Subcutaneous Q24H  . FLUoxetine  40 mg Oral Daily  . gabapentin  600 mg Oral BID  . hydrALAZINE  50 mg Oral TID  . hydrOXYzine  25 mg Oral QHS  . insulin aspart  0-9 Units Subcutaneous TID WC  . insulin detemir  12 Units Subcutaneous Daily  . ipratropium-albuterol  3 mL Nebulization TID  .  loratadine  10 mg Oral Daily  . losartan  100 mg Oral Daily  . methylPREDNISolone (SOLU-MEDROL) injection  60 mg Intravenous Q12H  . metoprolol succinate  100 mg Oral Daily  . montelukast  10 mg Oral QHS  . omega-3 acid ethyl esters  1 capsule Oral BID  . polyvinyl alcohol  1 drop Both Eyes BID  . rosuvastatin  20 mg Oral Daily    Continuous Infusions:    LOS: 3 days  Desiree Hane, MD [facility] Pager [phone]  If 7PM-7AM, please contact night-coverage [email or web address] Password [email or web address] 09/21/2017, 11:55 AM

## 2017-09-21 NOTE — Progress Notes (Signed)
Received call from RN, stating that pt is going home tomorrow and she needs a neb breathing machine. Contacted Jemaine with [facility] for referral.

## 2017-09-21 NOTE — Progress Notes (Addendum)
SATURATION QUALIFICATIONS: (This note is used to comply with regulatory documentation for home oxygen)  Patient Saturations on Room Air at Rest = 88%  Patient Saturations on Room Air while Ambulating = 84%  Patient Saturations on 3 Liters of oxygen while Ambulating = 96%  Please briefly explain why patient needs home oxygen: Patient with history of COPD desats on room air while ambulating

## 2017-09-22 LAB — CBC
HCT: 32.9 % — ABNORMAL LOW (ref 36.0–46.0)
Hemoglobin: 10.3 g/dL — ABNORMAL LOW (ref 12.0–15.0)
MCH: 25.4 pg — ABNORMAL LOW (ref 26.0–34.0)
MCHC: 31.3 g/dL (ref 30.0–36.0)
MCV: 81 fL (ref 78.0–100.0)
Platelets: 211 10*3/uL (ref 150–400)
RBC: 4.06 MIL/uL (ref 3.87–5.11)
RDW: 13.8 % (ref 11.5–15.5)
WBC: 11 10*3/uL — ABNORMAL HIGH (ref 4.0–10.5)

## 2017-09-22 LAB — CULTURE, BLOOD (ROUTINE X 2)
Culture: NO GROWTH
Culture: NO GROWTH
Special Requests: ADEQUATE
Special Requests: ADEQUATE

## 2017-09-22 LAB — GLUCOSE, CAPILLARY
Glucose-Capillary: 336 mg/dL — ABNORMAL HIGH (ref 65–99)
Glucose-Capillary: 381 mg/dL — ABNORMAL HIGH (ref 65–99)

## 2017-09-22 MED ORDER — METHYLPREDNISOLONE 32 MG PO TABS: 32.0000 mg | ORAL_TABLET | Freq: Every day | ORAL | 0 refills | Status: AC

## 2017-09-22 MED ORDER — ALBUTEROL SULFATE (2.5 MG/3ML) 0.083% IN NEBU: 2.5000 mg | INHALATION_SOLUTION | RESPIRATORY_TRACT | 12 refills | Status: DC | PRN

## 2017-09-22 MED ORDER — CEFPODOXIME PROXETIL 200 MG PO TABS: 200.0000 mg | ORAL_TABLET | Freq: Two times a day (BID) | ORAL | 0 refills | Status: AC

## 2017-09-22 MED ORDER — AZITHROMYCIN 500 MG PO TABS: 500.0000 mg | ORAL_TABLET | Freq: Every day | ORAL | 0 refills | Status: AC

## 2017-09-22 MED ORDER — GUAIFENESIN-DM 100-10 MG/5ML PO SYRP: 5.0000 mL | ORAL_SOLUTION | ORAL | 0 refills | Status: AC | PRN

## 2017-09-22 NOTE — Progress Notes (Signed)
Patient with discharge orders home today.  In to speak with patient regarding DME orders for oxygen and nebulizer.  Offered choice of DME provider selected [facility].  Referral for DME: nebulizer/oxygen called to [facility] discharge transition coordinator with [facility]. Arranged for DME to be delivered to patient room prior to discharge.  Royetta Crochet. Mady Gemma, BSN, [facility]

## 2017-09-22 NOTE — Progress Notes (Signed)
Pt discharged home with daughter. [facility] home care delivered o2 and nebz and explained to patient and daughter in detail how to use. AVS given to and reviewed with patient and daughter. All questions answered. All belongings sent with patient, including DME. VSS. BP (!) 164/72 (BP Location: Right Arm)   Pulse 89   Temp 97.8 F (36.6 C) (Oral)   Resp 20   Ht 5' (1.524 m)   Wt 82.3 kg (181 lb 7 oz)   SpO2 96%   BMI 35.43 kg/m

## 2017-09-22 NOTE — Care Management Important Message (Signed)
Important Message  Patient Details  Name: Destiny Ballard MRN: [id number] Date of Birth: 1951-06-09   Medicare Important Message Given:  Yes    Orbie Pyo 09/22/2017, 12:56 PM

## 2017-09-22 NOTE — Progress Notes (Signed)
Inpatient Diabetes Program Recommendations  AACE/ADA: New Consensus Statement on Inpatient Glycemic Control (2015)  Target Ranges:  Prepandial:   less than 140 mg/dL      Peak postprandial:   less than 180 mg/dL (1-2 hours)      Critically ill patients:  140 - 180 mg/dL   Lab Results  Component Value Date   GLUCAP 336 (H) 09/22/2017   HGBA1C 8.2 (H) 09/01/2017    Review of Glycemic Control Results for Destiny Ballard, Destiny Ballard "[name]" (MRN [id number]) as of 09/22/2017 11:02  Ref. Range 09/21/2017 11:57 09/21/2017 16:53 09/21/2017 18:59 09/21/2017 21:01 09/22/2017 07:41  Glucose-Capillary Latest Ref Range: 65 - 99 mg/dL 454 (H) 392 (H) 401 (H) 470 (H) 336 (H)   Diabetes history: DM2 Outpatient Diabetes medications: Levemir 10 units + Prandin 2 mg tid + Metformin 1 gm bid Current orders for Inpatient glycemic control: Levemir 12 units qd + Novolog correction sensitive tid  Inpatient Diabetes Program Recommendations:   -While on steroids, Add Novolog 3 units tid meal coverage if eats 50%  Thank you, Bethena Roys E. Brookes Craine, RN, MSN, CDE  Diabetes Coordinator Inpatient Glycemic Control Team Team Pager [phone] (8am-5pm) 09/22/2017 11:04 AM

## 2017-09-22 NOTE — Consult Note (Signed)
[facility] CM Primary Care Navigator  09/22/2017  Destiny Ballard 02-Nov-1950 [id number]   Met with patientand daughter Destiny Ballard) at the bedside to identify possible discharge needs. Patient reportshaving "increased persistent coughing, fever and decreased oxygen level"thathad resulted to this admission.  Patient endorsesDr. Janett Billow Copland with [facility] as the primary care provider.   Patient shared using [facility] [address] obtain medications withoutdifficulty.   Patientreportsmanagingher ownmedications at [employer] use of "pillbox" system filled weekly.  Patient verbalizedthat she has been driving prior to admission but her daughter will be able to provide transportation to her doctors'appointments if needed after discharge.  Patient statesthat her daughterEverlene Ballard lives in the same house but occupying the upper level and canassist her with care needsat home. Patient also mentioned having 2 other daughters in town who can help with care when needed.    Anticipated discharge plan ishome per patient.  Patient and daughter voiced understanding to call primary care provider's office for a post discharge follow-up appointment within1- 2 weeksor sooner if needs arise.Patient letter (with PCP's contact number) wasprovided as a reminder.  Patient voiced plan of attending diabetic classes after discharge as well.   Patient was admitted and found to have sepsis secondary to multifocal pneumonia complicated by acute exacerbation of COPD. She will be going home with oxygen and nebulizer treatment (equipments delivered to room by [facility] staff). Patient admits lack of knowledge in managing COPD. She is not aware of COPD zones/ tool and COPD action plan. Patient has minimal recall of signs and symptoms of COPD that needs medical assistance.  Explained to patientand daughter regarding[facility]N  CM services available for healthmanagement at home. Patient expressed interest about it and prefers home visits for now. Patient opted andverbally agreedtoTHN community care management for assessment of care needs andassist withmanagement of COPDand other chronic illnesses at home.  Referral made to [facility] care management coordinator for transition of care, assessment of needs and assist with COPD management and other chronic health issues after discharge.  [facility] care management information provided for future needs thatshe may have.    For additional questions please contact:  Edwena Felty A. Arlyn Bumpus, BSN, RN-BC [facility] PRIMARY CARE Navigator Cell: [phone]

## 2017-09-22 NOTE — Evaluation (Signed)
Physical Therapy Evaluation Patient Details Name: Destiny Ballard MRN: [id number] DOB: 05-Mar-1951 Today's Date: 09/22/2017   History of Present Illness  Pt is a 67 y/o female admitted secondary to lobar pneumonia. PMH includes DM, CAD s/p stent placement, dCHF, and COPD.   Clinical Impression  Pt admitted secondary to problem above with deficits below. Pt tolerated gait well with only slight SOB. Had on LOB requiring min guard A for steadying, otherwise required supervision. Educated about oxygen management at home and pt practiced managing oxygen tank.  Anticipate pt will progress well and will not need follow up PT services. Will continue to follow acutely to maximize functional mobility independence and safety.     Follow Up Recommendations No PT follow up;Supervision for mobility/OOB    Equipment Recommendations  None recommended by PT    Recommendations for Other Services       Precautions / Restrictions Precautions Precautions: None Restrictions Weight Bearing Restrictions: No      Mobility  Bed Mobility Overal bed mobility: Modified Independent             General bed mobility comments: Increased time, however, no external assist required.   Transfers Overall transfer level: Needs assistance Equipment used: None Transfers: Sit to/from Stand Sit to Stand: Supervision         General transfer comment: Supervision for safety.   Ambulation/Gait Ambulation/Gait assistance: Min guard;Supervision Ambulation Distance (Feet): 100 Feet Assistive device: None Gait Pattern/deviations: Step-through pattern;Decreased stride length Gait velocity: Decreased.  Gait velocity interpretation: Below normal speed for age/gender General Gait Details: Slow, slightly unsteady gait. One LOB noted at beginning of gait requiring min guard for steadying assist. No other LOB noted. Pt practiced with oxygen tank management during gait as well, as pt going home with oxygen.  Demonstrated safe management. Reviewed line management techniques for home. Only noted slight SOB during gait.   Stairs            Wheelchair Mobility    Modified Rankin (Stroke Patients Only)       Balance Overall balance assessment: Needs assistance Sitting-balance support: No upper extremity supported;Feet supported Sitting balance-Leahy Scale: Good Sitting balance - Comments: Able to put slippers on sitting EOB without LOB.    Standing balance support: No upper extremity supported;During functional activity Standing balance-Leahy Scale: Fair                               Pertinent Vitals/Pain Pain Assessment: No/denies pain    Home Living Family/patient expects to be discharged to:: Private residence Living Arrangements: Children Available Help at Discharge: Family;Available 24 hours/day Type of Home: House Home Access: Level entry     Home Layout: Two level;Able to live on main level with bedroom/bathroom Home Equipment: Gilford Rile - 2 wheels;Cane - single point;Bedside commode;Shower seat      Prior Function Level of Independence: Independent               Hand Dominance   Dominant Hand: Left    Extremity/Trunk Assessment   Upper Extremity Assessment Upper Extremity Assessment: Overall WFL for tasks assessed    Lower Extremity Assessment Lower Extremity Assessment: LLE deficits/detail LLE Deficits / Details: Reports sciatic pain for longer ambulation distances.     Cervical / Trunk Assessment Cervical / Trunk Assessment: Normal  Communication   Communication: No difficulties  Cognition Arousal/Alertness: Awake/alert Behavior During Therapy: WFL for tasks assessed/performed Overall Cognitive Status: Within Functional Limits  for tasks assessed                                        General Comments General comments (skin integrity, edema, etc.): Pt's daughter present during session. Discussed energy conservation  techniques for ADL tasks.    Exercises     Assessment/Plan    PT Assessment Patient needs continued PT services  PT Problem List Decreased balance;Decreased mobility;Decreased knowledge of use of DME;Decreased knowledge of precautions;Cardiopulmonary status limiting activity       PT Treatment Interventions DME instruction;Gait training;Functional mobility training;Therapeutic activities;Therapeutic exercise;Balance training;Neuromuscular re-education;Patient/family education    PT Goals (Current goals can be found in the Care Plan section)  Acute Rehab PT Goals Patient Stated Goal: to go home  PT Goal Formulation: With patient Time For Goal Achievement: 10/06/17 Potential to Achieve Goals: Good    Frequency Min 3X/week   Barriers to discharge        Co-evaluation               AM-PAC PT "6 Clicks" Daily Activity  Outcome Measure Difficulty turning over in bed (including adjusting bedclothes, sheets and blankets)?: None Difficulty moving from lying on back to sitting on the side of the bed? : A Little Difficulty sitting down on and standing up from a chair with arms (e.g., wheelchair, bedside commode, etc,.)?: A Little Help needed moving to and from a bed to chair (including a wheelchair)?: A Little Help needed walking in hospital room?: A Little Help needed climbing 3-5 steps with a railing? : A Lot 6 Click Score: 18    End of Session Equipment Utilized During Treatment: Gait belt;Oxygen Activity Tolerance: Patient tolerated treatment well Patient left: in bed;with call bell/phone within reach;with family/visitor present Nurse Communication: Mobility status PT Visit Diagnosis: Unsteadiness on feet (R26.81);Other abnormalities of gait and mobility (R26.89)    Time: 1120-1140 PT Time Calculation (min) (ACUTE ONLY): 20 min   Charges:   PT Evaluation $PT Eval Low Complexity: 1 Low     PT G Codes:        Leighton Ruff, PT, DPT  Acute Rehabilitation  Services  Pager: [phone]   Rudean Hitt 09/22/2017, 12:10 PM

## 2017-09-22 NOTE — Plan of Care (Signed)
  Progressing Education: Knowledge of General Education information will improve 09/22/2017 1121 - Progressing by Harlin Heys, RN Health Behavior/Discharge Planning: Ability to manage health-related needs will improve 09/22/2017 1121 - Progressing by Harlin Heys, RN Clinical Measurements: Ability to maintain clinical measurements within normal limits will improve 09/22/2017 1121 - Progressing by Harlin Heys, RN Will remain free from infection 09/22/2017 1121 - Progressing by Harlin Heys, RN Diagnostic test results will improve 09/22/2017 1121 - Progressing by Harlin Heys, RN Respiratory complications will improve 09/22/2017 1121 - Progressing by Harlin Heys, RN Cardiovascular complication will be avoided 09/22/2017 1121 - Progressing by Harlin Heys, RN Activity: Risk for activity intolerance will decrease 09/22/2017 1121 - Progressing by Harlin Heys, RN Nutrition: Adequate nutrition will be maintained 09/22/2017 1121 - Progressing by Harlin Heys, RN Clinical Measurements: Ability to maintain a body temperature in the normal range will improve 09/22/2017 1121 - Progressing by Harlin Heys, RN Respiratory: Ability to maintain adequate ventilation will improve 09/22/2017 1121 - Progressing by Harlin Heys, RN Ability to maintain a clear airway will improve 09/22/2017 1121 - Progressing by Harlin Heys, RN

## 2017-09-22 NOTE — Discharge Summary (Signed)
Discharge Summary  Destiny Ballard:[id number] DOB: 1951/07/09  PCP: Darreld Mclean, MD  Admit date: 09/17/2017 Discharge date: 09/22/2017  Time spent: < 25 minutes  Admitted From: Home Disposition: Home  Recommendations for Outpatient Follow-up:  1. Follow up with PCP in 1-2 weeks.  Vantin and azithromycin for 2 more days on (3/12-3/13); continue methylprednisolone for 2 days ( 3/12-3/13)    Equipment/Devices: DME for portable oxygen, DME for nebulizer machine and nebs treatment  Discharge Diagnoses:  Active Hospital Problems   Diagnosis Date Noted  . Lobar pneumonia ([place]) 09/17/2017  . Sepsis ([place]) 09/18/2017  . Chronic diastolic CHF (congestive heart failure) ([place]) 09/18/2017  . Acute respiratory failure with hypoxia ([place]) 09/18/2017  . Respiratory failure with hypoxia ([place]) 09/18/2017  . COPD with acute exacerbation ([place]) 04/25/2016  . Essential hypertension 01/30/2016  . Depression with anxiety   . CAD (coronary artery disease)   . HLD (hyperlipidemia)   . Type 2 diabetes mellitus without complication, without long-term current use of insulin [facility])     Resolved Hospital Problems  No resolved problems to display.    Discharge Condition: Stable  CODE STATUS: Full Diet recommendation: Carb Modified   Vitals:   09/22/17 0851 09/22/17 0930  BP:  (!) 175/70  Pulse:  90  Resp:  18  Temp:  97.8 F (36.6 C)  SpO2: 93% 92%    History of present illness:  Destiny Ballard is a 67 y.o. year old female with medical history significant for CAD, T2DM, HTN, dCHF, depression with anxiety who presented on 09/17/2017 with cough and progressive shortness of breath and was found to have sepsis secondary to multifocal pneumonia complicated by acute exacerbation of COPD.  Remainder of hospital stay addressed below problem based format:  Hospital Course:  Principal Problem:   Lobar pneumonia ([place]) Active Problems:   Type 2 diabetes mellitus without complication, without  long-term current use of insulin (HCC)   HLD (hyperlipidemia)   Depression with anxiety   CAD (coronary artery disease)   Essential hypertension   COPD with acute exacerbation (HCC)   Sepsis (HCC)   Chronic diastolic CHF (congestive heart failure) (HCC)   Acute respiratory failure with hypoxia (HCC)   Respiratory failure with hypoxia (HCC)   #Acute hypoxic respiratory failure secondary to lobar pneumonia and COPD exacerbation #Sepsis secondary to community acquired pneumonia, resolved - On admission:T-max 90.9, tachypnea 24, tachycardic 105, lactic acid 2.5 (now resolved) -Chest x-ray-right upper middle lobe multifocal pneumonia -Sputum culture normal respiratory Geneal -Strep pneumo, Legionella and influenza panel negative - Patient required 2 L nasal cannula throughout hospitalization, (84 SPO2 on room air while ambulating, 96% on 3 L while ambulating), - Breathing improved significantly while on empiric IV ceftriaxone and azithromycin is also transition to oral Vantin and azithromycin -Given persistent wheezing and oxygen requirement with worsening cough IV Solu-Medrol and scheduled duo nebs were given and patient had good response -Patient tolerated oral methylprednisolone (declined prednisone given hypersensitivity) during hospital stay Plan - DME for portable oxygen (2 L at rest, 3 L with ambulation)provided prior to discharge -Continue Vantin, azithromycin, methylprednisolone for 2 more days, end date 09/24/17 -DME for embolize machine provided on discharge (albuterol nebs PRN every 4) disease -Continue Spiriva scheduled, supportive care with Tessalon Perles, incentive spirometer, flutter valve -Emphasized importance of PCP follow-up in 1-2 weeks.  Shared with patient and daughter at bedside  #OSA -Patient declined CPAP during hospital stay  #Type 2 diabetes, poorly controlled -A1c 8.20 (08/2017) disease.  Worsening blood glucose in setting of IV steroid use.  Expect improvement  now will transition to oral steroids.  No change in the patient's home Levemir given prednisolone be discontinued 2 days    Antibiotics:3/ 6: ceftriaxone, azithromycin  3/9: Vantin, azithromycin     Microbiology:   3/6- blood NGTD  Sputum culture negative     Consultations:     Procedures/Studies:  Dg Chest 2 View  Result Date: 09/17/2017 CLINICAL DATA:  Fever and congestion for 1 week EXAM: CHEST - 2 VIEW COMPARISON:  01/26/2016 FINDINGS: Cardiac shadow remains enlarged. Calcified breast implant is noted on the right and stable. Patchy infiltrative changes noted within the right upper lobe and right middle lobe consistent with acute pneumonia. Hyperinflation consistent with COPD is noted as well. IMPRESSION: Right upper and middle lobe multifocal pneumonia. COPD. Electronically Signed   By: Inez Catalina M.D.   On: 09/17/2017 19:07     Discharge Exam: BP (!) 175/70 (BP Location: Left Arm)   Pulse 90   Temp 97.8 F (36.6 C) (Oral)   Resp 18   Ht 5' (1.524 m)   Wt 82.3 kg (181 lb 7 oz)   SpO2 92%   BMI 35.43 kg/m   General: Comfortably sitting in bed, no acute distress Eyes: EOMI, anicteric ENT: Oral Mucosa clear and moist Cardiovascular: regular rate and rhythm, no murmurs, rubs or gallops, no peripheral edema Respiratory: Normal respiratory effort on 2 L, normal breath sounds bilaterally without wheezing or crackles Abdomen: soft, non-distended, non-tender, normal bowel sounds Skin: No Rash Musculoskeletal:Good ROM, no contractures. Normal muscle tone Neurologic: Grossly no focal neuro deficit.Mental status AAOx3, speech normal, Psychiatric:Appropriate affect, and mood   Discharge Instructions You were cared for by a hospitalist during your hospital stay. If you have any questions about your discharge medications or the care you received while you were in the hospital after you are discharged, you can call the unit and asked to speak with the hospitalist on  call if the hospitalist that took care of you is not available. Once you are discharged, your primary care physician will handle any further medical issues. Please note that NO REFILLS for any discharge medications will be authorized once you are discharged, as it is imperative that you return to your primary care physician (or establish a relationship with a primary care physician if you do not have one) for your aftercare needs so that they can reassess your need for medications and monitor your lab values.  Discharge Instructions    Diet - low sodium heart healthy   Complete by:  As directed    Increase activity slowly   Complete by:  As directed      Allergies as of 09/22/2017      Reactions   Prednisone Other (See Comments)   REACTION: mood swings, nightmares. "Shot doesn't bother me, reaction is just with the pill" she states she has had the steroid injections before. From our records methylprednisone was given to her in 2013 without any complications.      Medication List    TAKE these medications   acetaminophen 650 MG CR tablet Commonly known as:  TYLENOL Take 1,300 mg by mouth every 8 (eight) hours as needed for pain.   albuterol 108 (90 Base) MCG/ACT inhaler Commonly known as:  PROAIR HFA Inhale 2 puffs into the lungs every 4 (four) hours as needed for wheezing or shortness of breath. What changed:  Another medication with the same name was added.  Make sure you understand how and when to take each.   albuterol (2.5 MG/3ML) 0.083% nebulizer solution Commonly known as:  PROVENTIL Take 3 mLs (2.5 mg total) by nebulization every 4 (four) hours as needed for wheezing or shortness of breath. What changed:  You were already taking a medication with the same name, and this prescription was added. Make sure you understand how and when to take each.   aspirin EC 81 MG tablet Take 81 mg by mouth daily.   azithromycin 500 MG tablet Commonly known as:  ZITHROMAX Take 1 tablet (500  mg total) by mouth daily for 2 days. Start taking on:  09/23/2017   benzonatate 100 MG capsule Commonly known as:  TESSALON Take 1 capsule (100 mg total) by mouth 3 (three) times daily as needed for cough.   calcium carbonate 500 MG chewable tablet Commonly known as:  TUMS - dosed in mg elemental calcium Chew 2 tablets by mouth daily as needed for indigestion or heartburn.   cefpodoxime 200 MG tablet Commonly known as:  VANTIN Take 1 tablet (200 mg total) by mouth every 12 (twelve) hours for 2 days.   cetirizine 10 MG tablet Commonly known as:  ZYRTEC TAKE 10 MG BY MOUTH DAILY   clonazePAM 0.5 MG tablet Commonly known as:  KLONOPIN TAKE 0.5 TABLETS (0.25 MG TOTAL) BY MOUTH 2 TIMES DAILY AS NEEDED FOR ANXIETY What changed:  See the new instructions.   CRESTOR 20 MG tablet Generic drug:  rosuvastatin TAKE 1 TABLET BY MOUTH EVERY DAY What changed:    how much to take  how to take this  when to take this   diphenhydramine-acetaminophen 25-500 MG Tabs tablet Commonly known as:  TYLENOL PM Take 2 tablets by mouth at bedtime as needed (for sleep).   FLUoxetine 40 MG capsule Commonly known as:  PROZAC Take 1 capsule (40 mg total) by mouth daily.   fluticasone 50 MCG/ACT nasal spray Commonly known as:  FLONASE Place 2 sprays into both nostrils daily. What changed:    when to take this  reasons to take this   furosemide 20 MG tablet Commonly known as:  LASIX TAKE ONE TABLET DAILY IF NEEDED FOR LEG SWELLING What changed:  See the new instructions.   gabapentin 300 MG capsule Commonly known as:  NEURONTIN Take 600 mg by mouth 2 (two) times daily.   guaiFENesin-dextromethorphan 100-10 MG/5ML syrup Commonly known as:  ROBITUSSIN DM Take 5 mLs by mouth every 4 (four) hours as needed for up to 5 days for cough (chest congestion).   hydrALAZINE 25 MG tablet Commonly known as:  APRESOLINE Take 2 tablets (50 mg total) by mouth 3 (three) times daily.   hydrOXYzine 25  MG tablet Commonly known as:  ATARAX/VISTARIL Take 1 tablet (25 mg total) by mouth at bedtime.   ibuprofen 200 MG tablet Commonly known as:  ADVIL,MOTRIN Take 800 mg by mouth every 6 (six) hours as needed for headache or moderate pain.   Insulin Detemir 100 UNIT/ML Pen Commonly known as:  LEVEMIR FLEXTOUCH Inject 10 Units into the skin daily.   losartan 100 MG tablet Commonly known as:  COZAAR Take 1 tablet (100 mg total) by mouth daily.   lovastatin 20 MG tablet Commonly known as:  MEVACOR Take 1 tablet (20 mg total) by mouth at bedtime.   metFORMIN 500 MG 24 hr tablet Commonly known as:  GLUCOPHAGE-XR Take 2 tablets (1,000 mg total) by mouth 2 (two) times daily.   methocarbamol  750 MG tablet Commonly known as:  ROBAXIN TAKE 1 TABLET BY MOUTH EVERY 12 HOURS AS NEEDED FOR MUSCLE SPASMS What changed:    how much to take  how to take this  when to take this  reasons to take this  additional instructions   methylPREDNISolone 32 MG tablet Commonly known as:  MEDROL Take 1 tablet (32 mg total) by mouth daily for 2 days. Start taking on:  09/23/2017   metoprolol succinate 100 MG 24 hr tablet Commonly known as:  TOPROL-XL Take 1 tablet (100 mg total) by mouth daily.   montelukast 10 MG tablet Commonly known as:  SINGULAIR Take 1 tablet (10 mg total) by mouth at bedtime.   nitroGLYCERIN 0.4 MG SL tablet Commonly known as:  NITROSTAT Place 1 tablet (0.4 mg total) under the tongue every 5 (five) minutes as needed for chest pain.   omega-3 acid ethyl esters 1 g capsule Commonly known as:  LOVAZA Take 1 capsule (1 g total) by mouth 2 (two) times daily.   potassium chloride 10 MEQ tablet Commonly known as:  K-DUR Take 1 tablet (10 mEq total) by mouth daily. Take only on days that you use lasix/ furosemide What changed:    when to take this  additional instructions   REFRESH OP Place 2 drops into both eyes daily as needed (for dry eyes).   repaglinide 2 MG  tablet Commonly known as:  PRANDIN Take 1 tablet (2 mg total) by mouth 3 (three) times daily before meals.   Tiotropium Bromide Monohydrate 2.5 MCG/ACT Aers Commonly known as:  SPIRIVA RESPIMAT Inhale 2 puffs into the lungs daily.   Vitamin D3 1000 units Caps Take 1,000 Units by mouth daily.            Durable Medical Equipment  (From admission, onward)        Start     Ordered   09/22/17 1010  For home use only DME Nebulizer machine  Once    Question:  Patient needs a nebulizer to treat with the following condition  Answer:  COPD (chronic obstructive pulmonary disease) ([place])   09/22/17 1009   09/22/17 1010  For home use only DME Nebulizer/meds  Once    Question:  Patient needs a nebulizer to treat with the following condition  Answer:  COPD (chronic obstructive pulmonary disease) ([place])   09/22/17 1009   09/22/17 1009  For home use only DME oxygen  Once    Question Answer Comment  Mode or (Route) Nasal cannula   Liters per Minute 2   Frequency Continuous (stationary and portable oxygen unit needed)   Oxygen delivery system Gas      09/22/17 1009     Allergies  Allergen Reactions  . Prednisone Other (See Comments)    REACTION: mood swings, nightmares. "Shot doesn't bother me, reaction is just with the pill" she states she has had the steroid injections before. From our records methylprednisone was given to her in 2013 without any complications.   Follow-up Information    Copland, Gay Filler, MD. Call.   Specialty:  Family Medicine Contact information: [address] [place] [address] [phone]        [facility] - Dme Follow up.   Why:  Arranged for delivery prior to discharge Contact information: [address] [place] [address] [phone]            The results of significant diagnostics from this hospitalization (  including imaging, microbiology, ancillary and laboratory) are listed below for reference.     Significant Diagnostic Studies: Dg Chest 2 View  Result Date: 09/17/2017 CLINICAL DATA:  Fever and congestion for 1 week EXAM: CHEST - 2 VIEW COMPARISON:  01/26/2016 FINDINGS: Cardiac shadow remains enlarged. Calcified breast implant is noted on the right and stable. Patchy infiltrative changes noted within the right upper lobe and right middle lobe consistent with acute pneumonia. Hyperinflation consistent with COPD is noted as well. IMPRESSION: Right upper and middle lobe multifocal pneumonia. COPD. Electronically Signed   By: Inez Catalina M.D.   On: 09/17/2017 19:07    Microbiology: Recent Results (from the past 240 hour(s))  Blood culture (routine x 2)     Status: None (Preliminary result)   Collection Time: 09/17/17  5:30 PM  Result Value Ref Range Status   Specimen Description   Final    BLOOD BLOOD RIGHT WRIST Performed at [facility], [address] [place], [place] [address]    Special Requests   Final    BOTTLES DRAWN AEROBIC AND ANAEROBIC Blood Culture adequate volume Performed at [facility], [address] [place], [place] [address]    Culture   Final    NO GROWTH 4 DAYS Performed at [facility], [place] [address], [place], [place] [address]    Report Status PENDING  Incomplete  Blood culture (routine x 2)     Status: None (Preliminary result)   Collection Time: 09/17/17  5:30 PM  Result Value Ref Range Status   Specimen Description   Final    BLOOD RIGHT ANTECUBITAL Performed at [facility], [address] [place], [place] [address]    Special Requests   Final    BOTTLES DRAWN AEROBIC AND ANAEROBIC Blood Culture adequate volume Performed at [facility], [address] [place], [place] [address]    Culture   Final    NO GROWTH 4 DAYS Performed at [facility], [place] [address] [place], [place] [address]    Report Status PENDING  Incomplete  Culture, sputum-assessment     Status: None   Collection  Time: 09/18/17 11:30 PM  Result Value Ref Range Status   Specimen Description EXPECTORATED SPUTUM  Final   Special Requests NONE  Final   Sputum evaluation   Final    THIS SPECIMEN IS ACCEPTABLE FOR SPUTUM CULTURE Performed at [facility], [place] [address] [place], [place] [address]    Report Status 09/20/2017 FINAL  Final  Culture, respiratory (NON-Expectorated)     Status: None   Collection Time: 09/18/17 11:30 PM  Result Value Ref Range Status   Specimen Description EXPECTORATED SPUTUM  Final   Special Requests NONE Reflexed from [id number]  Final   Gram Stain   Final    RARE WBC PRESENT, PREDOMINANTLY PMN RARE SQUAMOUS EPITHELIAL CELLS PRESENT FEW GRAM POSITIVE COCCI RARE GRAM NEGATIVE RODS RARE GRAM POSITIVE RODS    Culture   Final    MODERATE Consistent with normal respiratory [place]. Performed at [facility], [place] [address], [place], [place] [address]    Report Status 09/21/2017 FINAL  Final     Labs: Basic Metabolic Panel: Recent Labs  Lab 09/15/17 1523 09/17/17 1729 09/19/17 0631 09/20/17 0442 09/21/17 0420  NA 139 133* 135 136 137  K 4.2 3.5 3.6 4.3 4.3  CL 97 95* 96* 96* 96*  CO2 25  28 27 27 31   GLUCOSE 184* 212* 180* 263* 329*  BUN 14 12 6 11  22*  CREATININE 0.72 0.73 0.67 0.60 0.77  CALCIUM 9.5 8.8* 8.5* 8.9 9.5   Liver Function Tests: Recent Labs  Lab 09/17/17 1729  AST 31  ALT 18  ALKPHOS 94  BILITOT 0.4  PROT 7.4  ALBUMIN 3.3*   No results for input(s): LIPASE, AMYLASE in the last 168 hours. No results for input(s): AMMONIA in the last 168 hours. CBC: Recent Labs  Lab 09/17/17 1729 09/19/17 0631 09/20/17 0442 09/21/17 0420 09/22/17 0511  WBC 10.4 9.8 9.5 11.9* 11.0*  NEUTROABS 7.8*  --   --   --   --   HGB 11.5* 10.1* 10.5* 10.3* 10.3*  HCT 36.0 32.4* 33.3* 31.9* 32.9*  MCV 80.0 81.6 80.6 79.4 81.0  PLT 239 197 199 PLATELET CLUMPS NOTED ON SMEAR, UNABLE TO ESTIMATE 211   Cardiac Enzymes: Recent Labs  Lab  09/17/17 2222  TROPONINI <0.03   BNP: BNP (last 3 results) Recent Labs    09/18/17 0348  BNP 45.7    ProBNP (last 3 results) No results for input(s): PROBNP in the last 8760 hours.  CBG: Recent Labs  Lab 09/21/17 1653 09/21/17 1859 09/21/17 2101 09/22/17 0741 09/22/17 1137  GLUCAP 392* 401* 470* 336* 381*       Signed:  Desiree Hane, MD [facility] 09/22/2017, 11:43 AM

## 2017-09-23 ENCOUNTER — Other Ambulatory Visit: Payer: Self-pay

## 2017-09-23 ENCOUNTER — Telehealth: Payer: Self-pay | Admitting: *Deleted

## 2017-09-23 ENCOUNTER — Ambulatory Visit: Payer: Medicare Other | Admitting: Nurse Practitioner

## 2017-09-23 ENCOUNTER — Other Ambulatory Visit: Payer: Self-pay | Admitting: *Deleted

## 2017-09-23 DIAGNOSIS — I25118 Atherosclerotic heart disease of native coronary artery with other forms of angina pectoris: Secondary | ICD-10-CM

## 2017-09-23 DIAGNOSIS — I5032 Chronic diastolic (congestive) heart failure: Secondary | ICD-10-CM

## 2017-09-23 NOTE — Telephone Encounter (Signed)
S/w pt just got discharged from hospital yesterday, New date on LCP for abnormal myoview is March 27, pt's time is 8:30 and pt has to be in short stay at 6:30.  Pt is to hold insulin and prandin the day of and metformin 48 hours prior.  Pt will come in on march 25 for pre-cath labs. appt made, orders placed in system and linked.Pt is aware of all instructions.

## 2017-09-23 NOTE — Telephone Encounter (Signed)
Destiny Ballard:[id number] DOB: 20-Jan-1951  PCP: Darreld Mclean, MD  Admit date: 09/17/2017 Discharge date: 09/22/2017  Time spent: < 25 minutes  Admitted From: Home Disposition: Home  Recommendations for Outpatient Follow-up:  1. Follow up with PCP in 1-2 weeks.  Vantin and azithromycin for 2 more days on (3/12-3/13); continue methylprednisolone for 2 days ( 3/12-3/13)    Equipment/Devices: DME for portable oxygen, DME for nebulizer machine and nebs treatment  Discharge Diagnoses:      Active Hospital Problems   Diagnosis Date Noted  . Lobar pneumonia ([place]) 09/17/2017  . Sepsis ([place]) 09/18/2017  . Chronic diastolic CHF (congestive heart failure) ([place]) 09/18/2017  . Acute respiratory failure with hypoxia ([place]) 09/18/2017  . Respiratory failure with hypoxia ([place]) 09/18/2017  . COPD with acute exacerbation ([place]) 04/25/2016  . Essential hypertension 01/30/2016  . Depression with anxiety   . CAD (coronary artery disease)   . HLD (hyperlipidemia)   . Type 2 diabetes mellitus without complication, without long-term current use of insulin [facility])     Resolved Hospital Problems  No resolved problems to display.    Discharge Condition: Stable  CODE STATUS: Full Diet recommendation: Carb Modified   Transition Care Management Follow-up Telephone Call   How have you been since you were released from the hospital? Doing okay.   Do you understand why you were in the hospital? yes   Do you understand the discharge instructions? yes   Where were you discharged to? Home with oldest daughter who lives upstairs.   Items Reviewed:  Medications reviewed: doesn't have list near by  Allergies reviewed: yes  Dietary changes reviewed: yes  Referrals reviewed: yes   Functional Questionnaire:   Activities of Daily Living (ADLs):   She states they are independent in the following: ambulation, bathing and hygiene, feeding, continence, grooming,  toileting and dressing States they require assistance with the following: na   Any transportation issues/concerns?: no   Any patient concerns? no   Confirmed importance and date/time of follow-up visits scheduled yes  Provider Appointment booked with Dr.Copland 09/29/17 @1130   Confirmed with patient if condition begins to worsen call PCP or go to the ER.  Patient was given the office number and encouraged to call back with question or concerns.  : yes

## 2017-09-23 NOTE — Patient Outreach (Signed)
[facility]) Care Management  09/23/2017  Destiny Ballard Oct 14, 1950 [id number]   Transition of care   RN spoke with pt and introduced the [facility] services and purpose for today's call however pt indicated not a good time to speak with her due to her discharge was in another room and she did not feel up to obtain the sheet to review. Pt requested RN to follow up tomorrow to completed this task. RN will at that time offer further engagement with possible home visit and ongoing transition of care contacts. Will call pt on tomorrow.  Raina Mina, RN Care Management Coordinator [place] Office [phone]

## 2017-09-24 ENCOUNTER — Other Ambulatory Visit: Payer: Self-pay | Admitting: *Deleted

## 2017-09-24 NOTE — Patient Outreach (Signed)
[facility]) Care Management  09/24/2017  Destiny Ballard [birth year] [id number]    Transition of care   RN attempted another call to pt today however pt indicated she was with [facility] with oxygen delivery and requested another call back on tomorrow. RN will again attempt another call back for pending [facility] services. Will scheduled another call on tomorrow.  Raina Mina, RN Care Management Coordinator [place] Office [phone]

## 2017-09-25 ENCOUNTER — Other Ambulatory Visit: Payer: Self-pay | Admitting: *Deleted

## 2017-09-25 NOTE — Patient Outreach (Signed)
[facility]) Care Management  09/25/2017  Destiny Ballard 21-Apr-1951 [id number]   Transition of care  RN has made several attempt to start the process of transition of care and was successful today by completing the transition of care template with further engagement with pt being receptive to ongoing transition of care calls over the several weeks. RN inquired further on her recent hospitalization(FLU)  and enrolled pt into the COPD program due to her request and lack of knowledge of COPD. RN discussed a plan of care, goals and interventions to assist pt further in managing her COPD. Offered to send her a [facility] calendar for future education to review the COPD action plan. Also discussed an action plan on what to do if acute symptoms should occur. Pt verbalized and understanding and very appreciative for the information provided today. Verified pt is in the GREEN zone today once zones discussed and strongly encouraged pt to take her ordered medications via inhalers/nebulizers treatments to prevent exacerbated symptoms. Also verified her recent appointment with her primary following her hospital discharge and any other pending appointments. RN strongly encouraged adherence as a prevention measure to lower the risk of readmission. RN offered home visits for further engagement however pt remains receptive to transition of care calls only at this time. RN will notify primary of pt's disposition with [facility] and scheduled the follow call for next week as allowed.   Patient was recently discharged from hospital and all medications have been reviewed.  Raina Mina, RN Care Management Coordinator [place] Office [phone]

## 2017-09-28 NOTE — Progress Notes (Signed)
[place] at [facility] [address], [place], [place] [address] [phone]  Date:  09/29/2017   Name:  Destiny Ballard   DOB:  17-Jun-1951   MRN:  [id number]  PCP:  Darreld Mclean, MD    Chief Complaint: Hospitalization Follow-up (Pt here for hosp f/u. ) and Medication Refill (Would like 90 day supply of benzonatate. )   History of Present Illness:  Destiny Ballard is a 67 y.o. very pleasant female patient who presents with the following:  Hospital follow-up today I had seen her in the office in February when she had just lost her husband She then was admitted earlier this month with CAP and sepsis:  Admit date: 09/17/2017 Discharge date: 09/22/2017  Recommendations for Outpatient Follow-up:  1. Follow up with PCP in 1-2 weeks.  Vantin and azithromycin for 2 more days on (3/12-3/13); continue methylprednisolone for 2 days ( 3/12-3/13)  Equipment/Devices: DME for portable oxygen, DME for nebulizer machine and nebs treatment  Discharge Diagnoses:      Active Hospital Problems   Diagnosis Date Noted  . Lobar pneumonia ([place]) 09/17/2017  . Sepsis ([place]) 09/18/2017  . Chronic diastolic CHF (congestive heart failure) ([place]) 09/18/2017  . Acute respiratory failure with hypoxia ([place]) 09/18/2017  . Respiratory failure with hypoxia ([place]) 09/18/2017  . COPD with acute exacerbation ([place]) 04/25/2016  . Essential hypertension 01/30/2016  . Depression with anxiety   . CAD (coronary artery disease)   . HLD (hyperlipidemia)   . Type 2 diabetes mellitus without complication, without long-term current use of insulin [facility])     Resolved Hospital Problems  No resolved problems to display.   History of present illness:  Destiny B Phillipsis a 67 y.o.year old femalewith medical history significant for CAD, T2DM, HTN, dCHF, depression with anxiety who presented on 3/6/2019with cough and progressive shortness of breath and was  found to have sepsis secondary to multifocal pneumonia complicated by acute exacerbation of COPD.  Remainder of hospital stay addressed below problem based format:  Hospital Course:  Principal Problem:   Lobar pneumonia ([place]) Active Problems:   Type 2 diabetes mellitus without complication, without long-term current use of insulin (HCC)   HLD (hyperlipidemia)   Depression with anxiety   CAD (coronary artery disease)   Essential hypertension   COPD with acute exacerbation (HCC)   Sepsis (HCC)   Chronic diastolic CHF (congestive heart failure) (HCC)   Acute respiratory failure with hypoxia (HCC)   Respiratory failure with hypoxia (HCC)  #Acute hypoxic respiratory failure secondary to lobar pneumonia and COPD exacerbation #Sepsis secondary to community acquired pneumonia, resolved - On admission:T-max 90.9, tachypnea 24, tachycardic 105, lactic acid 2.5 (now resolved) -Chest x-ray-right upper middle lobe multifocal pneumonia -Sputum culture normal respiratory Destiny -Strep pneumo, Legionella and influenza panel negative - Patient required 2 L nasal cannula throughout hospitalization, (84 SPO2 on room air while ambulating, 96% on 3 L while ambulating), - Breathing improved significantly while on empiric IV ceftriaxone and azithromycin is also transition to oral Vantin and azithromycin -Given persistent wheezing and oxygen requirement with worsening cough IV Solu-Medrol and scheduled duo nebs were given and patient had good response -Patient tolerated oral methylprednisolone (declined prednisone given hypersensitivity) during hospital stay Plan - DME for portable oxygen (2 L at rest, 3 L with ambulation)provided prior to discharge -Continue Vantin, azithromycin, methylprednisolone for 2 more days, end date 09/24/17 -DME for embolize machine provided on discharge (albuterol nebs PRN  every 4) disease -Continue Spiriva scheduled, supportive care with Tessalon Perles, incentive spirometer,  flutter valve -Emphasized importance of PCP follow-up in 1-2 weeks.  Shared with patient and daughter at bedside  #OSA -Patient declined CPAP during hospital stay  #Type 2 diabetes, poorly controlled -A1c 8.20 (08/2017) disease.  Worsening blood glucose in setting of IV steroid use.  Expect improvement now will transition to oral steroids.  No change in the patient's home Levemir given prednisolone be discontinued 2 day  Antibiotics:3/ 6: ceftriaxone, azithromycin  3/9: Vantin, azithromycin   She is feeling better, but still a bit weak.  Finished with her antibiotics She is still using oxygen at home- 2L.  She is using this when she is at home, but not all the time, did not bring it with her today She was fitted for CPAP while inpt but did not get the machine that she needs- she will need to get a CPAP machine for home Will contact Dr. Lamonte Sakai for her- she is also not sure about following up with him for her COPD  She does have spiriva as well, just started using this.   She is not sure how much this is helping her so far as she is still getting over her acute illness  However no bothersome SE noted, she will continue to use it   She is not around smoke any longer, but was exposed to heavy 2nd hand smoke for years.  Now that [name] has passed away she is not exposed to smoke  She is eating ok No fever Her weight is stable. decreased- no sign of fluid retention  Wt Readings from Last 3 Encounters:  09/29/17 175 lb 9.6 oz (79.7 kg)  09/21/17 181 lb 7 oz (82.3 kg)  09/15/17 180 lb (81.6 kg)   Lab Results  Component Value Date   HGBA1C 8.2 (H) 09/01/2017      Patient Active Problem List   Diagnosis Date Noted  . Sepsis ([place]) 09/18/2017  . Chronic diastolic CHF (congestive heart failure) ([place]) 09/18/2017  . Acute respiratory failure with hypoxia ([place]) 09/18/2017  . Respiratory failure with hypoxia ([place]) 09/18/2017  . Lobar pneumonia ([place]) 09/17/2017  . Left hip pain 10/28/2016   . Chronic rhinitis 08/07/2016  . COPD with acute exacerbation ([place]) 04/25/2016  . Essential hypertension 01/30/2016  . High blood triglycerides   . Hypokalemia 01/27/2016  . Depression with anxiety   . CAD (coronary artery disease)   . HLD (hyperlipidemia)   . Type 2 diabetes mellitus without complication, without long-term current use of insulin ([place])   . OSA (obstructive sleep apnea) 05/06/2014  . Dyspnea on exertion 09/02/2012  . Chest pain 12/10/2011  . Chronic cough 05/11/2010  . Obesity (BMI 30-39.9)   . GERD 06/28/2008    Past Medical History:  Diagnosis Date  . Anxiety    Prior suicide attempt  . CAD (coronary artery disease)    a) s/p DES to LAD 07/2005 b) Last Myoview low risk 11/2011 showing small fixed apical perfusion defect (prior MI vs attenuation) but no ischemia - normal EF.  [name] Cervical spondylosis   . Coronary atherosclerosis 06/28/2008  . Depression   . Depression with anxiety   . Diabetes mellitus without complication ([place])   . GERD (gastroesophageal reflux disease)   . Hyperlipidemia   . Hypertension   . Insulin resistance   . Iron deficiency anemia   . Obesity     Past Surgical History:  Procedure Laterality Date  .  BREAST ENHANCEMENT SURGERY    . CARDIAC CATHETERIZATION  06/17/2007   NORMAL. EF 60%  . CARDIAC CATHETERIZATION N/A 01/29/2016   Procedure: Left Heart Cath and Coronary Angiography;  Surgeon: Sherren Mocha, MD;  Location: [facility];  Service: Cardiovascular;  Laterality: N/A;  . CERVICAL SPONDYLOSIS     SINGLE LEVEL FUSION  . CHILDBIRTH     X3  . CORONARY STENT PLACEMENT  07/2005   LEFT ANTERIOR DESCENDING  . FOREARM FRACTURE SURGERY  2010   hand and shoulder   . INCISION AND DRAINAGE BREAST ABSCESS  01/05/2012      . INCISION AND DRAINAGE PERIRECTAL ABSCESS N/A 02/18/2014   Procedure: IRRIGATION AND DEBRIDEMENT PERIRECTAL ABSCESS;  Surgeon: Pedro Earls, MD;  Location: [facility];  Service: General;  Laterality: N/A;  .  LUMBAR LAMINECTOMY    . ROTATOR CUFF REPAIR     bil[place]la  . TONSILLECTOMY AND ADENOIDECTOMY    . TUBAL LIGATION    . VIDEO BRONCHOSCOPY Bilateral 08/13/2016   Procedure: VIDEO BRONCHOSCOPY WITHOUT FLUORO;  Surgeon: Collene Gobble, MD;  Location: [facility];  Service: Cardiopulmonary;  Laterality: Bilateral;    Social History   Tobacco Use  . Smoking status: Never Smoker  . Smokeless tobacco: Never Used  Substance Use Topics  . Alcohol use: Yes    Comment: occ  . Drug use: No    Family History  Problem Relation Age of Onset  . Heart attack Mother   . Diabetes Mother   . Lung cancer Mother   . Asthma Mother   . Heart disease Mother   . Suicidality Father        "killed himself"  . Asthma Daughter        x 2  . Cancer Daughter        pre-cancerous polyp  . Diabetes Sister   . Cancer Sister   . Cervical cancer Daughter        cervical   . Allergies Other        all family--seasonal allergies    Allergies  Allergen Reactions  . Prednisone Other (See Comments)    REACTION: mood swings, nightmares. "Shot doesn't bother me, reaction is just with the pill" she states she has had the steroid injections before. From our records methylprednisone was given to her in 2013 without any complications.    Medication list has been reviewed and updated.  Current Outpatient Medications on File Prior to Visit  Medication Sig Dispense Refill  . acetaminophen (TYLENOL) 650 MG CR tablet Take 1,300 mg by mouth every 8 (eight) hours as needed for pain.    [name] albuterol (PROAIR HFA) 108 (90 Base) MCG/ACT inhaler Inhale 2 puffs into the lungs every 4 (four) hours as needed for wheezing or shortness of breath. 3 Inhaler 1  . albuterol (PROVENTIL) (2.5 MG/3ML) 0.083% nebulizer solution Take 3 mLs (2.5 mg total) by nebulization every 4 (four) hours as needed for wheezing or shortness of breath. 75 mL 12  . aspirin EC 81 MG tablet Take 81 mg by mouth daily.    . calcium carbonate (TUMS - DOSED  IN MG ELEMENTAL CALCIUM) 500 MG chewable tablet Chew 2 tablets by mouth daily as needed for indigestion or heartburn.    . cetirizine (ZYRTEC) 10 MG tablet TAKE 10 MG BY MOUTH DAILY  3  . Cholecalciferol (VITAMIN D3) 1000 UNITS CAPS Take 1,000 Units by mouth daily.     . clonazePAM (KLONOPIN) 0.5 MG tablet TAKE  0.5 TABLETS (0.25 MG TOTAL) BY MOUTH 2 TIMES DAILY AS NEEDED FOR ANXIETY (Patient taking differently: TAKE 0.5 MG BY MOUTH 2 TIMES DAILY AS NEEDED FOR ANXIETY) 20 tablet 1  . CRESTOR 20 MG tablet TAKE 1 TABLET BY MOUTH EVERY DAY (Patient taking differently: TAKE 20 MG BY MOUTH EVERY DAY) 90 tablet 3  . diphenhydramine-acetaminophen (TYLENOL PM) 25-500 MG TABS Take 2 tablets by mouth at bedtime as needed (for sleep).     [name] FLUoxetine (PROZAC) 40 MG capsule Take 1 capsule (40 mg total) by mouth daily. 90 capsule 1  . fluticasone (FLONASE) 50 MCG/ACT nasal spray Place 2 sprays into both nostrils daily. (Patient taking differently: Place 2 sprays into both nostrils daily as needed for allergies. ) 48 g 1  . furosemide (LASIX) 20 MG tablet TAKE ONE TABLET DAILY IF NEEDED FOR LEG SWELLING (Patient taking differently: TAKE 20 MG BY MOUTH DAILY) 30 tablet 1  . gabapentin (NEURONTIN) 300 MG capsule Take 600 mg by mouth 2 (two) times daily.     . hydrALAZINE (APRESOLINE) 25 MG tablet Take 2 tablets (50 mg total) by mouth 3 (three) times daily. 180 tablet 11  . hydrOXYzine (ATARAX/VISTARIL) 25 MG tablet Take 1 tablet (25 mg total) by mouth at bedtime. 90 tablet 3  . ibuprofen (ADVIL,MOTRIN) 200 MG tablet Take 800 mg by mouth every 6 (six) hours as needed for headache or moderate pain.     . Insulin Detemir (LEVEMIR FLEXTOUCH) 100 UNIT/ML Pen Inject 10 Units into the skin daily. 15 mL 3  . losartan (COZAAR) 100 MG tablet Take 1 tablet (100 mg total) by mouth daily. 90 tablet 3  . lovastatin (MEVACOR) 20 MG tablet Take 1 tablet (20 mg total) by mouth at bedtime. 90 tablet 3  . metFORMIN (GLUCOPHAGE-XR) 500  MG 24 hr tablet Take 2 tablets (1,000 mg total) by mouth 2 (two) times daily. 360 tablet 0  . methocarbamol (ROBAXIN) 750 MG tablet TAKE 1 TABLET BY MOUTH EVERY 12 HOURS AS NEEDED FOR MUSCLE SPASMS (Patient taking differently: Take 750 mg by mouth every 12 (twelve) hours as needed for muscle spasms. ) 60 tablet 0  . metoprolol succinate (TOPROL-XL) 100 MG 24 hr tablet Take 1 tablet (100 mg total) by mouth daily. 90 tablet 3  . montelukast (SINGULAIR) 10 MG tablet Take 1 tablet (10 mg total) by mouth at bedtime. 90 tablet 1  . nitroGLYCERIN (NITROSTAT) 0.4 MG SL tablet Place 1 tablet (0.4 mg total) under the tongue every 5 (five) minutes as needed for chest pain. 20 tablet 3  . omega-3 acid ethyl esters (LOVAZA) 1 g capsule Take 1 capsule (1 g total) by mouth 2 (two) times daily. 60 capsule 5  . Polyvinyl Alcohol-Povidone (REFRESH OP) Place 2 drops into both eyes daily as needed (for dry eyes).    . potassium chloride (K-DUR) 10 MEQ tablet Take 1 tablet (10 mEq total) by mouth daily. Take only on days that you use lasix/ furosemide (Patient taking differently: Take 10 mEq by mouth every evening. ) 30 tablet 3  . repaglinide (PRANDIN) 2 MG tablet Take 1 tablet (2 mg total) by mouth 3 (three) times daily before meals. 90 tablet 11  . Tiotropium Bromide Monohydrate (SPIRIVA RESPIMAT) 2.5 MCG/ACT AERS Inhale 2 puffs into the lungs daily. 2 Inhaler 0   No current facility-administered medications on file prior to visit.     Review of Systems:  As per HPI- otherwise negative. No current fever or chills  She is feeling pretty well today   Physical Examination: Vitals:   09/29/17 1136  BP: 110/82  Pulse: 84  Temp: 98 F (36.7 C)  SpO2: 96%   Vitals:   09/29/17 1136  Weight: 175 lb 9.6 oz (79.7 kg)  Height: 5' (1.524 m)   Body mass index is 34.29 kg/m. Ideal Body Weight: Weight in (lb) to have BMI = 25: 127.7  GEN: WDWN, NAD, Non-toxic, A & O x 3, overweight, looks well  HEENT:  Atraumatic, Normocephalic. Neck supple. No masses, No LAD.  Bilateral TM wnl, oropharynx normal.  PEERL,EOMI.   Ears and Nose: No external deformity. CV: RRR, No M/G/R. No JVD. No thrill. No extra heart sounds. PULM: CTA B, no wheezes, crackles, rhonchi. No retractions. No resp. distress. No accessory muscle use. Lungs sound good today ABD: S, NT, ND, +BS. No rebound. No HSM. EXTR: No c/c/e NEURO Normal gait.  PSYCH: Normally interactive. Conversant. Not depressed or anxious appearing.  Calm demeanor.   Had patient walk- sat to 92% on ROOM AIR Assessment and Plan: Community acquired pneumonia, unspecified laterality - Plan: DG Chest 2 View  Persistent cough - Plan: benzonatate (TESSALON) 100 MG capsule  Hospital discharge follow-up  Following up from hospital today- doing better from her recent CAP and sepsis  Chest is clear on repeat film Refilled her tessalon perles Will message Dr. Lamonte Sakai about getting her a CPAP machine, ? Does he want to see her and discuss her oxygen.  If not I can work with her on stopping home O2 when appropriate Plan to visit in 2-3 months for A1c and recheck   Called and LMOM that her chest x-ray is negative    Signed Lamar Blinks, MD  Received her chest x-ray-  Negative, infiltrate is cleared  Dg Chest 2 View  Result Date: 09/29/2017 CLINICAL DATA:  67 year old female status post pneumonia earlier this month. Cough congestion and shortness of breath. EXAM: CHEST - 2 VIEW COMPARISON:  09/17/2017 and earlier. FINDINGS: Calcified unilateral right breast implant re-demonstrated. Interval resolved confluent peribronchial opacity in the right mid lung. No new confluent opacity. No pneumothorax, pulmonary edema or pleural effusion. Normal cardiac size and mediastinal contours. Visualized tracheal air column is within normal limits. Cervical ACDF. No acute osseous abnormality identified. Negative visible bowel gas pattern. IMPRESSION: Resolved right lung  pneumonia.  No new cardiopulmonary abnormality. Electronically Signed   By: Genevie Ann M.D.   On: 09/29/2017 12:16   Dg Chest 2 View  Result Date: 09/17/2017 CLINICAL DATA:  Fever and congestion for 1 week EXAM: CHEST - 2 VIEW COMPARISON:  01/26/2016 FINDINGS: Cardiac shadow remains enlarged. Calcified breast implant is noted on the right and stable. Patchy infiltrative changes noted within the right upper lobe and right middle lobe consistent with acute pneumonia. Hyperinflation consistent with COPD is noted as well. IMPRESSION: Right upper and middle lobe multifocal pneumonia. COPD. Electronically Signed   By: Inez Catalina M.D.   On: 09/17/2017 19:07

## 2017-09-29 ENCOUNTER — Encounter: Payer: Self-pay | Admitting: Family Medicine

## 2017-09-29 ENCOUNTER — Ambulatory Visit (HOSPITAL_BASED_OUTPATIENT_CLINIC_OR_DEPARTMENT_OTHER)
Admission: RE | Admit: 2017-09-29 | Discharge: 2017-09-29 | Disposition: A | Discharge status: Home / Self Care | Payer: Medicare Other | Source: Ambulatory Visit | Attending: Family Medicine | Admitting: Family Medicine

## 2017-09-29 ENCOUNTER — Ambulatory Visit (INDEPENDENT_AMBULATORY_CARE_PROVIDER_SITE_OTHER): Payer: Medicare Other | Admitting: Family Medicine

## 2017-09-29 VITALS — BP 110/82 | HR 84 | Temp 98.0°F | Ht 60.0 in | Wt 175.6 lb

## 2017-09-29 DIAGNOSIS — Z09 Encounter for follow-up examination after completed treatment for conditions other than malignant neoplasm: Secondary | ICD-10-CM

## 2017-09-29 DIAGNOSIS — I259 Chronic ischemic heart disease, unspecified: Secondary | ICD-10-CM | POA: Diagnosis not present

## 2017-09-29 DIAGNOSIS — R05 Cough: Secondary | ICD-10-CM | POA: Diagnosis not present

## 2017-09-29 DIAGNOSIS — J189 Pneumonia, unspecified organism: Secondary | ICD-10-CM | POA: Diagnosis not present

## 2017-09-29 DIAGNOSIS — R053 Chronic cough: Secondary | ICD-10-CM

## 2017-09-29 DIAGNOSIS — R0602 Shortness of breath: Secondary | ICD-10-CM | POA: Diagnosis not present

## 2017-09-29 MED ORDER — BENZONATATE 100 MG PO CAPS: 100.0000 mg | ORAL_CAPSULE | Freq: Three times a day (TID) | ORAL | 3 refills | Status: DC | PRN

## 2017-09-29 NOTE — Patient Instructions (Signed)
Good to see you today- I am glad that you are feeling a bit better! We will repeat your chest x-ray today Your oxygen went to 92% with walking today- this is not bad.  We can likely stop your oxygen soon.   I will contact Dr. Lamonte Sakai about follow-up for you, and will ask him about getting you a CPAP machine   Please see me in 2-3 months so we can repeat your A1c  Lab Results  Component Value Date   HGBA1C 8.2 (H) 09/01/2017

## 2017-09-30 ENCOUNTER — Other Ambulatory Visit: Payer: Self-pay | Admitting: *Deleted

## 2017-09-30 NOTE — Patient Outreach (Signed)
[place] [facility]) Care Management  09/30/2017  Destiny Ballard [birth year] [id number]  Transition of care  RN spoke with pt today with update concerning her ongoing management of care. Pt reports she is doing well with a visit to her primary provider on 3/18. States her provider is weaning her from her home O2 and she does not use her home O2 at night only during the day when needed. Reports she continues to drive to her medical appointments with no delays or missed appointments and has sufficient supply on all her mediations. States she has a dry cough and continue to take the Mucinex. RN encouraged lozenges, ice chips, tea with honey or sugarless candy to assist with the dry cough and mouth. Educated pt on the purpose of the Mucinex to assist with thick secretion however this may result in dry cough spells and dry mouth. Biotene also discussed if needed for OTC lubrication. Verified pt remains in the GREEN zone and continue to re-educate pt on the COPD action plan and what to do if acute symptoms occur. Plan of care discussed with goals and adjusted the interventions accordingly.  Pt appreciative and remains receptive to ongoing transition of care call for next week. No other resources or needs at that time will continue transition of care calls.  Raina Mina, RN Care Management Coordinator [place] Office [phone]

## 2017-10-03 ENCOUNTER — Telehealth: Payer: Self-pay | Admitting: Emergency Medicine

## 2017-10-03 DIAGNOSIS — G4733 Obstructive sleep apnea (adult) (pediatric): Secondary | ICD-10-CM

## 2017-10-03 NOTE — Telephone Encounter (Signed)
She has tried / Chiropractor for CPAP before. Can we try to re-order please  Needs 13 cm H2O based on her PSG from 03/07/16.

## 2017-10-03 NOTE — Telephone Encounter (Signed)
-----   Message from Darreld Mclean, MD sent at 09/29/2017  5:16 PM EDT ----- [name], I saw Destiny Ballard today in hospital follow-up; she was admitted with CAP and sepsis.  Doing better now, but she was discharged with home O2 and also now wants to start on CPAP at home (apparently they gave her a mask in the hospital which she wants to use, but she does not have a machine).  Is your office able to set her up with a CPAP? If you want to see her in the office and discuss when to stop the O2 that is great, but if you don't otherwise need to see her I can do this instead.  Just let me know and thank you! [name] Copland

## 2017-10-03 NOTE — Telephone Encounter (Signed)
We can try to reorder CPAP  Spoke with the pt and she is okay with this  Order sent to [facility]

## 2017-10-06 ENCOUNTER — Other Ambulatory Visit: Payer: Medicare Other | Admitting: *Deleted

## 2017-10-06 ENCOUNTER — Other Ambulatory Visit: Payer: Self-pay | Admitting: *Deleted

## 2017-10-06 DIAGNOSIS — I25118 Atherosclerotic heart disease of native coronary artery with other forms of angina pectoris: Secondary | ICD-10-CM

## 2017-10-06 DIAGNOSIS — I5032 Chronic diastolic (congestive) heart failure: Secondary | ICD-10-CM | POA: Diagnosis not present

## 2017-10-06 NOTE — Patient Outreach (Signed)
[facility]) Care Management  10/06/2017  YOUNIQUE CASAD 24-Aug-1950 [id number]    Transition of care  RN spoke with pt today and received an update on her ongoing management of care. Pt indicated she is breathing well and using her nebulizer along with her inhalers as prescribed. Pt also states is using her home O2 when needed. Currently awaiting on insurance clearance related to a CPAP that has been discussed with pt several weeks ago. Pt states her provider's office is working with her pulmonologist. Plan of care discussed with goals adjusted based upon pt's progress. RN able to verify pt remains in the GREEN zone with no encountered issues however continue to reports in the morning after use of her nebulizer she has a productive cough and during the day most a dry cough with no fevers, chills or GI upset. Verified pt continue to take her medications as prescribed and attend all medical appointments as scheduled and has sufficient transportation. Pt remains aware that RN available for a one-on-one consultation in the home however remains receptive to the ongoing transition of care calls. RN will scheduled the next call next week and inquire further on pt's progress. No further needs or resources requested at this time.   Raina Mina, RN Care Management Coordinator [place] Office [phone]

## 2017-10-07 ENCOUNTER — Telehealth: Payer: Self-pay | Admitting: *Deleted

## 2017-10-07 ENCOUNTER — Ambulatory Visit: Payer: Medicare Other | Admitting: Nurse Practitioner

## 2017-10-07 LAB — CBC WITH DIFFERENTIAL/PLATELET
Basophils Absolute: 0 10*3/uL (ref 0.0–0.2)
Basos: 0 %
EOS (ABSOLUTE): 0.2 10*3/uL (ref 0.0–0.4)
Eos: 2 %
Hematocrit: 38.6 % (ref 34.0–46.6)
Hemoglobin: 12.3 g/dL (ref 11.1–15.9)
Immature Grans (Abs): 0 10*3/uL (ref 0.0–0.1)
Immature Granulocytes: 0 %
Lymphocytes Absolute: 2.6 10*3/uL (ref 0.7–3.1)
Lymphs: 27 %
MCH: 25.1 pg — ABNORMAL LOW (ref 26.6–33.0)
MCHC: 31.9 g/dL (ref 31.5–35.7)
MCV: 79 fL (ref 79–97)
Monocytes Absolute: 0.8 10*3/uL (ref 0.1–0.9)
Monocytes: 8 %
Neutrophils Absolute: 5.9 10*3/uL (ref 1.4–7.0)
Neutrophils: 63 %
Platelets: 320 10*3/uL (ref 150–379)
RBC: 4.91 x10E6/uL (ref 3.77–5.28)
RDW: 14.4 % (ref 12.3–15.4)
WBC: 9.5 10*3/uL (ref 3.4–10.8)

## 2017-10-07 LAB — BASIC METABOLIC PANEL
BUN/Creatinine Ratio: 25 (ref 12–28)
BUN: 17 mg/dL (ref 8–27)
CO2: 26 mmol/L (ref 20–29)
Calcium: 9.7 mg/dL (ref 8.7–10.3)
Chloride: 97 mmol/L (ref 96–106)
Creatinine, Ser: 0.69 mg/dL (ref 0.57–1.00)
GFR calc Af Amer: 103 mL/min/{1.73_m2} (ref >59)
GFR calc non Af Amer: 90 mL/min/{1.73_m2} (ref >59)
Glucose: 189 mg/dL — ABNORMAL HIGH (ref 65–99)
Potassium: 3.9 mmol/L (ref 3.5–5.2)
Sodium: 139 mmol/L (ref 134–144)

## 2017-10-07 LAB — PROTIME-INR
INR: 0.9 (ref 0.8–1.2)
Prothrombin Time: 9.7 s (ref 9.1–12.0)

## 2017-10-07 LAB — APTT: aPTT: 25 s (ref 24–33)

## 2017-10-07 NOTE — Telephone Encounter (Signed)
At request of Dr Martinique cardiac cath scheduled tomorrow has been cancelled, appointment for office visit has been scheduled for pt with Truitt Merle, NP in the office 10/08/17, cath has been rescheduled to 10/10/17 arrive 7 AM, for 9 AM cath.  I have discussed with patient, she verbalized understanding, agreed with plan.

## 2017-10-07 NOTE — Telephone Encounter (Addendum)
Pt contacted pre-catheterization scheduled at [facility] for: Wednesday October 08, 2017 8:30 AM Verified arrival time and place: [place] [facility] at: 6:30 AM Nothing to eat or drink after midnight prior to cath. Verified allergies in Epic.  Hold: Metformin 10/07/17, 10/08/17, and 48 hours post cath. Insulin AM of cath Furosemide AM of cath Prandin AM of cath  Except hold medications  AM meds can be  taken pre-cath with sip of water including: ASA 81 mg  Confirmed patient has responsible person to drive home post procedure and observe patient for 24 hours: yes

## 2017-10-08 ENCOUNTER — Ambulatory Visit (INDEPENDENT_AMBULATORY_CARE_PROVIDER_SITE_OTHER): Payer: Medicare Other | Admitting: Nurse Practitioner

## 2017-10-08 ENCOUNTER — Encounter: Payer: Self-pay | Admitting: Nurse Practitioner

## 2017-10-08 VITALS — BP 120/60 | HR 80 | Ht 60.0 in | Wt 172.1 lb

## 2017-10-08 DIAGNOSIS — I259 Chronic ischemic heart disease, unspecified: Secondary | ICD-10-CM

## 2017-10-08 DIAGNOSIS — I5032 Chronic diastolic (congestive) heart failure: Secondary | ICD-10-CM

## 2017-10-08 DIAGNOSIS — R9439 Abnormal result of other cardiovascular function study: Secondary | ICD-10-CM | POA: Diagnosis not present

## 2017-10-08 DIAGNOSIS — F4321 Adjustment disorder with depressed mood: Secondary | ICD-10-CM

## 2017-10-08 DIAGNOSIS — I25118 Atherosclerotic heart disease of native coronary artery with other forms of angina pectoris: Secondary | ICD-10-CM | POA: Diagnosis not present

## 2017-10-08 MED ORDER — OMEGA-3-ACID ETHYL ESTERS 1 G PO CAPS: 1.0000 | ORAL_CAPSULE | Freq: Two times a day (BID) | ORAL | 5 refills | Status: DC

## 2017-10-08 MED ORDER — HYDRALAZINE HCL 25 MG PO TABS: 50.0000 mg | ORAL_TABLET | Freq: Three times a day (TID) | ORAL | 11 refills | Status: DC

## 2017-10-08 NOTE — Progress Notes (Signed)
CARDIOLOGY OFFICE NOTE  Date:  10/08/2017    Destiny Ballard Date of Birth: 1951-05-27 Medical Record #[id number]  PCP:  Darreld Mclean, MD  Cardiologist:  Servando Snare    Chief Complaint  Patient presents with  . Coronary Artery Disease  . Chest Pain    Pre cath/FU visit    History of Present Illness: Destiny Ballard is a 67 y.o. female who presents today for a follow up/precath visit.  Former patient of Dr. Susa Simmonds and Dr. Claris Gladden - has primarily followed with me over the past few years.   She has amedical history significant of hypertension, hyperlipidemia, diabetes mellitus, GERD, anxiety, morbid obesity,&CAD, s/p stent placement 2007.Her last cath was in 2017and was stable. She has had a tendency towards medication non compliance.  I saw her last month as a work in - her husband "[name]" died right after Christmas. Things are pretty hard for her. Lots of stress/sadness. Some chest pain reported. Short of breath. Got her Myoview updated - intermediate study. Saw her back and arranged cardiac cath. On her labs, her WBC was elevated - she has had a chronic cough for many years - but ended up having pneumonia - was admitted. Cardiac cath was postponed.     Comes in today. Herealone. She remains very tearful. She is so sad.  Her chest pain continues - with and without exertion. Her cough - while improved - is still present. No fever. No sputum. She feels much better overall. Has been back to PCP - had clear CXR. We have moved her cath to Friday with Dr. Angelena Form. She had labs earlier this week that looked good. She is now on all of her medicines - BP has improved nicely. She has lost weight.   Past Medical History:  Diagnosis Date  . Anxiety    Prior suicide attempt  . CAD (coronary artery disease)    a) s/p DES to LAD 07/2005 b) Last Myoview low risk 11/2011 showing small fixed apical perfusion defect (prior MI vs attenuation) but no ischemia - normal EF.  Destiny Ballard  Cervical spondylosis   . Coronary atherosclerosis 06/28/2008  . Depression   . Depression with anxiety   . Diabetes mellitus without complication ([place])   . GERD (gastroesophageal reflux disease)   . Hyperlipidemia   . Hypertension   . Insulin resistance   . Iron deficiency anemia   . Obesity     Past Surgical History:  Procedure Laterality Date  . BREAST ENHANCEMENT SURGERY    . CARDIAC CATHETERIZATION  06/17/2007   NORMAL. EF 60%  . CARDIAC CATHETERIZATION N/A 01/29/2016   Procedure: Left Heart Cath and Coronary Angiography;  Surgeon: Sherren Mocha, MD;  Location: [facility];  Service: Cardiovascular;  Laterality: N/A;  . CERVICAL SPONDYLOSIS     SINGLE LEVEL FUSION  . CHILDBIRTH     X3  . CORONARY STENT PLACEMENT  07/2005   LEFT ANTERIOR DESCENDING  . FOREARM FRACTURE SURGERY  2010   hand and shoulder   . INCISION AND DRAINAGE BREAST ABSCESS  01/05/2012      . INCISION AND DRAINAGE PERIRECTAL ABSCESS N/A 02/18/2014   Procedure: IRRIGATION AND DEBRIDEMENT PERIRECTAL ABSCESS;  Surgeon: Pedro Earls, MD;  Location: [facility];  Service: General;  Laterality: N/A;  . LUMBAR LAMINECTOMY    . ROTATOR CUFF REPAIR     bilaterla  . TONSILLECTOMY AND ADENOIDECTOMY    . TUBAL LIGATION    . VIDEO BRONCHOSCOPY  Bilateral 08/13/2016   Procedure: VIDEO BRONCHOSCOPY WITHOUT FLUORO;  Surgeon: Collene Gobble, MD;  Location: [facility];  Service: Cardiopulmonary;  Laterality: Bilateral;     Medications: Current Meds  Medication Sig  . acetaminophen (TYLENOL) 650 MG CR tablet Take 1,300 mg by mouth every 8 (eight) hours as needed for pain.  Destiny Ballard albuterol (PROAIR HFA) 108 (90 Base) MCG/ACT inhaler Inhale 2 puffs into the lungs every 4 (four) hours as needed for wheezing or shortness of breath.  Destiny Ballard albuterol (PROVENTIL) (2.5 MG/3ML) 0.083% nebulizer solution Take 3 mLs (2.5 mg total) by nebulization every 4 (four) hours as needed for wheezing or shortness of breath.  Destiny Ballard aspirin EC 81 MG  tablet Take 81 mg by mouth daily.  . benzonatate (TESSALON) 100 MG capsule Take 1 capsule (100 mg total) by mouth 3 (three) times daily as needed for cough.  . calcium carbonate (TUMS - DOSED IN MG ELEMENTAL CALCIUM) 500 MG chewable tablet Chew 2 tablets by mouth daily as needed for indigestion or heartburn.  . cetirizine (ZYRTEC) 10 MG tablet TAKE 10 MG BY MOUTH DAILY  . Cholecalciferol (VITAMIN D3) 1000 UNITS CAPS Take 1,000 Units by mouth daily.   . clonazePAM (KLONOPIN) 0.5 MG tablet TAKE 0.5 TABLETS (0.25 MG TOTAL) BY MOUTH 2 TIMES DAILY AS NEEDED FOR ANXIETY (Patient taking differently: TAKE 0.5 MG BY MOUTH 2 TIMES DAILY AS NEEDED FOR ANXIETY)  . CRESTOR 20 MG tablet TAKE 1 TABLET BY MOUTH EVERY DAY (Patient taking differently: TAKE 20 MG BY MOUTH EVERY DAY)  . diphenhydramine-acetaminophen (TYLENOL PM) 25-500 MG TABS Take 2 tablets by mouth at bedtime as needed (for sleep).   Destiny Ballard FLUoxetine (PROZAC) 40 MG capsule Take 1 capsule (40 mg total) by mouth daily.  . fluticasone (FLONASE) 50 MCG/ACT nasal spray Place 2 sprays into both nostrils daily. (Patient taking differently: Place 2 sprays into both nostrils daily as needed for allergies. )  . furosemide (LASIX) 20 MG tablet TAKE ONE TABLET DAILY IF NEEDED FOR LEG SWELLING (Patient taking differently: TAKE 20 MG BY MOUTH DAILY)  . gabapentin (NEURONTIN) 300 MG capsule Take 600 mg by mouth 2 (two) times daily.   . hydrALAZINE (APRESOLINE) 25 MG tablet Take 2 tablets (50 mg total) by mouth 3 (three) times daily.  . hydrOXYzine (ATARAX/VISTARIL) 25 MG tablet Take 1 tablet (25 mg total) by mouth at bedtime.  Destiny Ballard ibuprofen (ADVIL,MOTRIN) 200 MG tablet Take 800 mg by mouth every 6 (six) hours as needed for headache or moderate pain.   . Insulin Detemir (LEVEMIR FLEXTOUCH) 100 UNIT/ML Pen Inject 10 Units into the skin daily.  Destiny Ballard losartan (COZAAR) 100 MG tablet Take 1 tablet (100 mg total) by mouth daily.  Destiny Ballard lovastatin (MEVACOR) 20 MG tablet Take 1 tablet  (20 mg total) by mouth at bedtime.  . metFORMIN (GLUCOPHAGE-XR) 500 MG 24 hr tablet Take 2 tablets (1,000 mg total) by mouth 2 (two) times daily.  . methocarbamol (ROBAXIN) 750 MG tablet TAKE 1 TABLET BY MOUTH EVERY 12 HOURS AS NEEDED FOR MUSCLE SPASMS (Patient taking differently: Take 750 mg by mouth every 12 (twelve) hours as needed for muscle spasms. )  . metoprolol succinate (TOPROL-XL) 100 MG 24 hr tablet Take 1 tablet (100 mg total) by mouth daily.  . montelukast (SINGULAIR) 10 MG tablet Take 1 tablet (10 mg total) by mouth at bedtime.  . nitroGLYCERIN (NITROSTAT) 0.4 MG SL tablet Place 1 tablet (0.4 mg total) under the tongue every 5 (five) minutes as  needed for chest pain.  Destiny Ballard omega-3 acid ethyl esters (LOVAZA) 1 g capsule Take 1 capsule (1 g total) by mouth 2 (two) times daily.  . Polyvinyl Alcohol-Povidone (REFRESH OP) Place 2 drops into both eyes daily as needed (for dry eyes).  . potassium chloride (K-DUR) 10 MEQ tablet Take 1 tablet (10 mEq total) by mouth daily. Take only on days that you use lasix/ furosemide (Patient taking differently: Take 10 mEq by mouth every evening. )  . repaglinide (PRANDIN) 2 MG tablet Take 1 tablet (2 mg total) by mouth 3 (three) times daily before meals.  . Tiotropium Bromide Monohydrate (SPIRIVA RESPIMAT) 2.5 MCG/ACT AERS Inhale 2 puffs into the lungs daily.     Allergies: Allergies  Allergen Reactions  . Prednisone Other (See Comments)    REACTION: mood swings, nightmares. "Shot doesn't bother me, reaction is just with the pill" she states she has had the steroid injections before. From our records methylprednisone was given to her in 2013 without any complications.    Social History: The patient  reports that she has never smoked. She has never used smokeless tobacco. She reports that she drinks alcohol. She reports that she does not use drugs.   Family History: The patient's family history includes Allergies in her other; Asthma in her daughter  and mother; Cancer in her daughter and sister; Cervical cancer in her daughter; Diabetes in her mother and sister; Heart attack in her mother; Heart disease in her mother; Lung cancer in her mother; Suicidality in her father.   Review of Systems: Please see the history of present illness.   Otherwise, the review of systems is positive for none.   All other systems are reviewed and negative.   Physical Exam: VS:  BP 120/60   Pulse 80   Ht 5' (1.524 m)   Wt 172 lb 1.9 oz (78.1 kg)   SpO2 95%   BMI 33.61 kg/m  .  BMI Body mass index is 33.61 kg/m.  Wt Readings from Last 3 Encounters:  10/08/17 172 lb 1.9 oz (78.1 kg)  09/29/17 175 lb 9.6 oz (79.7 kg)  09/21/17 181 lb 7 oz (82.3 kg)    General: Alert. She is crying and tearful again today but in no acute distress.   HEENT: Normal.  Neck: Supple, no JVD, carotid bruits, or masses noted.  Cardiac: Regular rate and rhythm. No murmurs, rubs, or gallops. No edema.  Respiratory:  Lungs are fairly clear to auscultation bilaterally with normal work of breathing. Her cough is improved.  GI: Soft and nontender.  MS: No deformity or atrophy. Gait and ROM intact.  Skin: Warm and dry. Color is normal.  Neuro:  Strength and sensation are intact and no gross focal deficits noted.  Psych: Alert, appropriate and with normal affect.   LABORATORY DATA:  EKG:  EKG is not ordered today.  Lab Results  Component Value Date   WBC 9.5 10/06/2017   HGB 12.3 10/06/2017   HCT 38.6 10/06/2017   PLT 320 10/06/2017   GLUCOSE 189 (H) 10/06/2017   CHOL 239 (H) 04/03/2017   TRIG 389.0 (H) 04/03/2017   HDL 52.20 04/03/2017   LDLDIRECT 100.0 04/03/2017   LDLCALC UNABLE TO CALCULATE IF TRIGLYCERIDE OVER 400 mg/dL 01/27/2016   ALT 18 09/17/2017   AST 31 09/17/2017   NA 139 10/06/2017   K 3.9 10/06/2017   CL 97 10/06/2017   CREATININE 0.69 10/06/2017   BUN 17 10/06/2017   CO2 26 10/06/2017  TSH 2.361 01/27/2016   INR 0.9 10/06/2017   HGBA1C 8.2 (H)  09/01/2017   MICROALBUR <0.7 07/24/2016     BNP (last 3 results) Recent Labs    09/18/17 0348  BNP 45.7    ProBNP (last 3 results) No results for input(s): PROBNP in the last 8760 hours.   Other Studies Reviewed Today:  Myoview Study Highlights 08/2017    Nuclear stress EF: 64%.  There was no ST segment deviation noted during stress.  Findings consistent with ischemia.  This is an intermediate risk study.  The left ventricular ejection fraction is normal (55-65%).  1. EF 64%, normal wall motion.  2. Partially reversible medium-sized, moderate intensity mid to apical anteroseptal and apical anterior perfusion defect. This is concerning for ischemia.   Intermediate risk study.      EchoStudy Conclusions2/2019  - Left ventricle: The cavity size was normal. There was moderate concentric hypertrophy. Systolic function was normal. The estimated ejection fraction was in the range of 60% to 65%. Wall motion was normal; there were no regional wall motion abnormalities. There was an increased relative contribution of atrial contraction to ventricular filling. Doppler parameters are consistent with abnormal left ventricular relaxation (grade 1 diastolic dysfunction). - Aortic valve: Valve area (VTI): 1.97 cm^2. Valve area (Vmax): 1.67 cm^2. Valve area (Vmean): 1.71 cm^2. - Left atrium: The atrium was mildly dilated. - Pulmonary arteries: Systolic pressure could not be accurately estimated.    Heart cath: 01/29/16 1. Continued patency of the LAD stent 2. Mild nonobstructive CAD  3. Normal LV function by echo assessment  Assessment/Plan:  1.Chest pain and CAD: s/p of stent 2007.Stable cardiac cath findingsfrom 2017. Now with intermediate stress test and somewhat atypical/typical chest pain. She was referred for cardiac cath and this is recommended again. She has already agreed after being given informed consent - The patient  understands that risks include but are not limited to stroke (1 in 1000), death (1 in 1000), kidney failure [usually temporary] (1 in 500), bleeding (1 in 200), allergic reaction [possibly serious] (1 in 200), and agrees to proceed. Will proceed this Friday with Dr. Angelena Form. Will hold her Metformin as of now. Holding Prandin and Lasix on morning of as well.   2.HTN:BP looks better - I think she is now taking her medicines as recommended.   3.GERD: -on chronic PPI  4.DM-II:uncontrolled  5.HLD with hypertriglyceridemia:most likely from poor diabetic control as well.  6.Chronic cough and chronic mild SOB: she is followed by pulmonary  7. Recent pneumonia - looks resolved.   8.Situational stress/grief - very sad situation.She remains truly heartbroken. Will be hard for her going forward.    Current medicines are reviewed with the patient today.  The patient does not have concerns regarding medicines other than what has been noted above.  The following changes have been made:  See above.  Labs/ tests ordered today include:   No orders of the defined types were placed in this encounter.    Disposition:   FU with me as planned in April.   Patient is agreeable to this plan and will call if any problems develop in the interim.   SignedTruitt Merle, NP  10/08/2017 3:08 PM  [facility] [address] [place], [place]  [address] Phone: [phone] Fax: [phone]

## 2017-10-08 NOTE — Patient Instructions (Signed)
We will be checking the following labs today - NONE   Medication Instructions:    Continue with your current medicines.     Testing/Procedures To Be Arranged:  Cardiac catheterization  Follow-Up:   See me as planned in April    Other Special Instructions:   Your provider has recommended a cardiac catherization  You are scheduled for a cardiac catheterization on Friday, March 29th at [place] [facility] with Dr. Angelena Form or associate.  Please arrive at the [facility] (Main Entrance) at [facility] at [address], [place] Stay on Friday, March 29th at [place] note: Every effort is made to have your procedure done on time.   Please understand that emergencies sometimes delay a scheduled   procedure.  No food or drink after midnight on Thursday. STOP Glucophage as of now On the morning of your procedure, take your medicines except for the Prandin and Insulin.   You may take your morning medications with a sip of water on the day of your procedure.  Please take a baby aspirin (81 mg) on the morning of your procedure.    Plan for a one night stay -- bring personal belongings.  Bring a current list of your medications and current insurance cards.  You MUST have a responsible person to drive you home. Someone MUST be with you the first 24 hours after you arrive home or your discharge will be delayed. Wear clothes that are easy to get on and off and wear slip on shoes.    Coronary Angiogram A coronary angiogram, also called coronary angiography, is an X-ray procedure used to look at the arteries in the heart. In this procedure, a dye (contrast dye) is injected through a long, hollow tube (catheter). The catheter is about the size of a piece of cooked spaghetti and is inserted through your groin, wrist, or arm. The dye is injected into each artery, and X-rays are then taken to show if there is a blockage in the arteries of your heart.  LET [facility]  CARE PROVIDER KNOW ABOUT: Any allergies you have, including allergies to shellfish or contrast dye.  All medicines you are taking, including vitamins, herbs, eye drops, creams, and over-the-counter medicines.  Previous problems you or members of your family have had with the use of anesthetics.  Any blood disorders you have.  Previous surgeries you have had. History of kidney problems or failure.  Other medical conditions you have.  RISKS AND COMPLICATIONS  Generally, a coronary angiogram is a safe procedure. However, about 1 person out of 1000 can have problems that may include: Allergic reaction to the dye. Bleeding/bruising from the access site or other locations. Kidney injury, especially in people with impaired kidney function. Stroke (rare). Heart attack (rare). Irregular rhythms (rare) Death (rare)  BEFORE THE PROCEDURE  Do not eat or drink anything after midnight the night before the procedure or as directed by your health care provider.  Ask your health care provider about changing or stopping your regular medicines. This is especially important if you are taking diabetes medicines or blood thinners.  PROCEDURE You may be given a medicine to help you relax (sedative) before the procedure. This medicine is given through an intravenous (IV) access tube that is inserted into one of your veins.  The area where the catheter will be inserted will be washed and shaved. This is usually done in the groin but may be done in the  fold of your arm (near your elbow) or in the wrist.  A medicine will be given to numb the area where the catheter will be inserted (local anesthetic).  The health care provider will insert the catheter into an artery. The catheter will be guided by using a special type of X-ray (fluoroscopy) of the blood vessel being examined.  A special dye will then be injected into the catheter, and X-rays will be taken. The dye will help to show where any narrowing  or blockages are located in the heart arteries.    AFTER THE PROCEDURE  If the procedure is done through the leg, you will be kept in bed lying flat for several hours. You will be instructed to not bend or cross your legs. The insertion site will be checked frequently.  The pulse in your feet or wrist will be checked frequently.  Additional blood tests, X-rays, and an electrocardiogram may be done.         If you need a refill on your cardiac medications before your next appointment, please call your pharmacy.   Call the [place] office at [phone] if you have any questions, problems or concerns.

## 2017-10-08 NOTE — H&P (View-Only) (Signed)
CARDIOLOGY OFFICE NOTE  Date:  10/08/2017    Destiny Ballard Date of Birth: [birth year] Medical Record #[id number]  PCP:  Destiny Mclean, MD  Cardiologist:  Servando Snare    Chief Complaint  Patient presents with  . Coronary Artery Disease  . Chest Pain    Pre cath/FU visit    History of Present Illness: Destiny Ballard is a 67 y.o. female who presents today for a follow up/precath visit.  Former patient of Dr. Susa Simmonds and Dr. Claris Gladden - has primarily followed with me over the past few years.   She has amedical history significant of hypertension, hyperlipidemia, diabetes mellitus, GERD, anxiety, morbid obesity,&CAD, s/p stent placement 2007.Her last cath was in 2017and was stable. She has had a tendency towards medication non compliance.  I saw her last month as a work in - her husband "[name]" died right after Christmas. Things are pretty hard for her. Lots of stress/sadness. Some chest pain reported. Short of breath. Got her Myoview updated - intermediate study. Saw her back and arranged cardiac cath. On her labs, her WBC was elevated - she has had a chronic cough for many years - but ended up having pneumonia - was admitted. Cardiac cath was postponed.     Comes in today. Here[place]. She remains very tearful. She is so sad.  Her chest pain continues - with and without exertion. Her cough - while improved - is still present. No fever. No sputum. She feels much better overall. Has been back to PCP - had clear CXR. We have moved her cath to Friday with Dr. Angelena Form. She had labs earlier this week that looked good. She is now on all of her medicines - BP has improved nicely. She has lost weight.   Past Medical History:  Diagnosis Date  . Anxiety    Prior suicide attempt  . CAD (coronary artery disease)    a) s/p DES to LAD 07/2005 b) Last Myoview low risk 11/2011 showing small fixed apical perfusion defect (prior MI vs attenuation) but no ischemia - normal EF.  Destiny Ballard  Cervical spondylosis   . Coronary atherosclerosis 06/28/2008  . Depression   . Depression with anxiety   . Diabetes mellitus without complication ([place])   . GERD (gastroesophageal reflux disease)   . Hyperlipidemia   . Hypertension   . Insulin resistance   . Iron deficiency anemia   . Obesity     Past Surgical History:  Procedure Laterality Date  . BREAST ENHANCEMENT SURGERY    . CARDIAC CATHETERIZATION  06/17/2007   NORMAL. EF 60%  . CARDIAC CATHETERIZATION N/A 01/29/2016   Procedure: Left Heart Cath and Coronary Angiography;  Surgeon: Sherren Mocha, MD;  Location: [facility];  Service: Cardiovascular;  Laterality: N/A;  . CERVICAL SPONDYLOSIS     SINGLE LEVEL FUSION  . CHILDBIRTH     X3  . CORONARY STENT PLACEMENT  07/2005   LEFT ANTERIOR DESCENDING  . FOREARM FRACTURE SURGERY  2010   hand and shoulder   . INCISION AND DRAINAGE BREAST ABSCESS  01/05/2012      . INCISION AND DRAINAGE PERIRECTAL ABSCESS N/A 02/18/2014   Procedure: IRRIGATION AND DEBRIDEMENT PERIRECTAL ABSCESS;  Surgeon: Pedro Earls, MD;  Location: [facility];  Service: General;  Laterality: N/A;  . LUMBAR LAMINECTOMY    . ROTATOR CUFF REPAIR     bilaterla  . TONSILLECTOMY AND ADENOIDECTOMY    . TUBAL LIGATION    . VIDEO BRONCHOSCOPY  Bilateral 08/13/2016   Procedure: VIDEO BRONCHOSCOPY WITHOUT FLUORO;  Surgeon: Collene Gobble, MD;  Location: [facility];  Service: Cardiopulmonary;  Laterality: Bilateral;     Medications: Current Meds  Medication Sig  . acetaminophen (TYLENOL) 650 MG CR tablet Take 1,300 mg by mouth every 8 (eight) hours as needed for pain.  Destiny Ballard albuterol (PROAIR HFA) 108 (90 Base) MCG/ACT inhaler Inhale 2 puffs into the lungs every 4 (four) hours as needed for wheezing or shortness of breath.  Destiny Ballard albuterol (PROVENTIL) (2.5 MG/3ML) 0.083% nebulizer solution Take 3 mLs (2.5 mg total) by nebulization every 4 (four) hours as needed for wheezing or shortness of breath.  Destiny Ballard aspirin EC 81 MG  tablet Take 81 mg by mouth daily.  . benzonatate (TESSALON) 100 MG capsule Take 1 capsule (100 mg total) by mouth 3 (three) times daily as needed for cough.  . calcium carbonate (TUMS - DOSED IN MG ELEMENTAL CALCIUM) 500 MG chewable tablet Chew 2 tablets by mouth daily as needed for indigestion or heartburn.  . cetirizine (ZYRTEC) 10 MG tablet TAKE 10 MG BY MOUTH DAILY  . Cholecalciferol (VITAMIN D3) 1000 UNITS CAPS Take 1,000 Units by mouth daily.   . clonazePAM (KLONOPIN) 0.5 MG tablet TAKE 0.5 TABLETS (0.25 MG TOTAL) BY MOUTH 2 TIMES DAILY AS NEEDED FOR ANXIETY (Patient taking differently: TAKE 0.5 MG BY MOUTH 2 TIMES DAILY AS NEEDED FOR ANXIETY)  . CRESTOR 20 MG tablet TAKE 1 TABLET BY MOUTH EVERY DAY (Patient taking differently: TAKE 20 MG BY MOUTH EVERY DAY)  . diphenhydramine-acetaminophen (TYLENOL PM) 25-500 MG TABS Take 2 tablets by mouth at bedtime as needed (for sleep).   Destiny Ballard FLUoxetine (PROZAC) 40 MG capsule Take 1 capsule (40 mg total) by mouth daily.  . fluticasone (FLONASE) 50 MCG/ACT nasal spray Place 2 sprays into both nostrils daily. (Patient taking differently: Place 2 sprays into both nostrils daily as needed for allergies. )  . furosemide (LASIX) 20 MG tablet TAKE ONE TABLET DAILY IF NEEDED FOR LEG SWELLING (Patient taking differently: TAKE 20 MG BY MOUTH DAILY)  . gabapentin (NEURONTIN) 300 MG capsule Take 600 mg by mouth 2 (two) times daily.   . hydrALAZINE (APRESOLINE) 25 MG tablet Take 2 tablets (50 mg total) by mouth 3 (three) times daily.  . hydrOXYzine (ATARAX/VISTARIL) 25 MG tablet Take 1 tablet (25 mg total) by mouth at bedtime.  Destiny Ballard ibuprofen (ADVIL,MOTRIN) 200 MG tablet Take 800 mg by mouth every 6 (six) hours as needed for headache or moderate pain.   . Insulin Detemir (LEVEMIR FLEXTOUCH) 100 UNIT/ML Pen Inject 10 Units into the skin daily.  Destiny Ballard losartan (COZAAR) 100 MG tablet Take 1 tablet (100 mg total) by mouth daily.  Destiny Ballard lovastatin (MEVACOR) 20 MG tablet Take 1 tablet  (20 mg total) by mouth at bedtime.  . metFORMIN (GLUCOPHAGE-XR) 500 MG 24 hr tablet Take 2 tablets (1,000 mg total) by mouth 2 (two) times daily.  . methocarbamol (ROBAXIN) 750 MG tablet TAKE 1 TABLET BY MOUTH EVERY 12 HOURS AS NEEDED FOR MUSCLE SPASMS (Patient taking differently: Take 750 mg by mouth every 12 (twelve) hours as needed for muscle spasms. )  . metoprolol succinate (TOPROL-XL) 100 MG 24 hr tablet Take 1 tablet (100 mg total) by mouth daily.  . montelukast (SINGULAIR) 10 MG tablet Take 1 tablet (10 mg total) by mouth at bedtime.  . nitroGLYCERIN (NITROSTAT) 0.4 MG SL tablet Place 1 tablet (0.4 mg total) under the tongue every 5 (five) minutes as  needed for chest pain.  Destiny Ballard omega-3 acid ethyl esters (LOVAZA) 1 g capsule Take 1 capsule (1 g total) by mouth 2 (two) times daily.  . Polyvinyl Alcohol-Povidone (REFRESH OP) Place 2 drops into both eyes daily as needed (for dry eyes).  . potassium chloride (K-DUR) 10 MEQ tablet Take 1 tablet (10 mEq total) by mouth daily. Take only on days that you use lasix/ furosemide (Patient taking differently: Take 10 mEq by mouth every evening. )  . repaglinide (PRANDIN) 2 MG tablet Take 1 tablet (2 mg total) by mouth 3 (three) times daily before meals.  . Tiotropium Bromide Monohydrate (SPIRIVA RESPIMAT) 2.5 MCG/ACT AERS Inhale 2 puffs into the lungs daily.     Allergies: Allergies  Allergen Reactions  . Prednisone Other (See Comments)    REACTION: mood swings, nightmares. "Shot doesn't bother me, reaction is just with the pill" she states she has had the steroid injections before. From our records methylprednisone was given to her in 2013 without any complications.    Social History: The patient  reports that she has never smoked. She has never used smokeless tobacco. She reports that she drinks alcohol. She reports that she does not use drugs.   Family History: The patient's family history includes Allergies in her other; Asthma in her daughter  and mother; Cancer in her daughter and sister; Cervical cancer in her daughter; Diabetes in her mother and sister; Heart attack in her mother; Heart disease in her mother; Lung cancer in her mother; Suicidality in her father.   Review of Systems: Please see the history of present illness.   Otherwise, the review of systems is positive for none.   All other systems are reviewed and negative.   Physical Exam: VS:  BP 120/60   Pulse 80   Ht 5' (1.524 m)   Wt 172 lb 1.9 oz (78.1 kg)   SpO2 95%   BMI 33.61 kg/m  .  BMI Body mass index is 33.61 kg/m.  Wt Readings from Last 3 Encounters:  10/08/17 172 lb 1.9 oz (78.1 kg)  09/29/17 175 lb 9.6 oz (79.7 kg)  09/21/17 181 lb 7 oz (82.3 kg)    General: Alert. She is crying and tearful again today but in no acute distress.   HEENT: Normal.  Neck: Supple, no JVD, carotid bruits, or masses noted.  Cardiac: Regular rate and rhythm. No murmurs, rubs, or gallops. No edema.  Respiratory:  Lungs are fairly clear to auscultation bilaterally with normal work of breathing. Her cough is improved.  GI: Soft and nontender.  MS: No deformity or atrophy. Gait and ROM intact.  Skin: Warm and dry. Color is normal.  Neuro:  Strength and sensation are intact and no gross focal deficits noted.  Psych: Alert, appropriate and with normal affect.   LABORATORY DATA:  EKG:  EKG is not ordered today.  Lab Results  Component Value Date   WBC 9.5 10/06/2017   HGB 12.3 10/06/2017   HCT 38.6 10/06/2017   PLT 320 10/06/2017   GLUCOSE 189 (H) 10/06/2017   CHOL 239 (H) 04/03/2017   TRIG 389.0 (H) 04/03/2017   HDL 52.20 04/03/2017   LDLDIRECT 100.0 04/03/2017   LDLCALC UNABLE TO CALCULATE IF TRIGLYCERIDE OVER 400 mg/dL 01/27/2016   ALT 18 09/17/2017   AST 31 09/17/2017   NA 139 10/06/2017   K 3.9 10/06/2017   CL 97 10/06/2017   CREATININE 0.69 10/06/2017   BUN 17 10/06/2017   CO2 26 10/06/2017  TSH 2.361 01/27/2016   INR 0.9 10/06/2017   HGBA1C 8.2 (H)  09/01/2017   MICROALBUR <0.7 07/24/2016     BNP (last 3 results) Recent Labs    09/18/17 0348  BNP 45.7    ProBNP (last 3 results) No results for input(s): PROBNP in the last 8760 hours.   Other Studies Reviewed Today:  Myoview Study Highlights 08/2017    Nuclear stress EF: 64%.  There was no ST segment deviation noted during stress.  Findings consistent with ischemia.  This is an intermediate risk study.  The left ventricular ejection fraction is normal (55-65%).  1. EF 64%, normal wall motion.  2. Partially reversible medium-sized, moderate intensity mid to apical anteroseptal and apical anterior perfusion defect. This is concerning for ischemia.   Intermediate risk study.      EchoStudy Conclusions2/2019  - Left ventricle: The cavity size was normal. There was moderate concentric hypertrophy. Systolic function was normal. The estimated ejection fraction was in the range of 60% to 65%. Wall motion was normal; there were no regional wall motion abnormalities. There was an increased relative contribution of atrial contraction to ventricular filling. Doppler parameters are consistent with abnormal left ventricular relaxation (grade 1 diastolic dysfunction). - Aortic valve: Valve area (VTI): 1.97 cm^2. Valve area (Vmax): 1.67 cm^2. Valve area (Vmean): 1.71 cm^2. - Left atrium: The atrium was mildly dilated. - Pulmonary arteries: Systolic pressure could not be accurately estimated.    Heart cath: 01/29/16 1. Continued patency of the LAD stent 2. Mild nonobstructive CAD  3. Normal LV function by echo assessment  Assessment/Plan:  1.Chest pain and CAD: s/p of stent 2007.Stable cardiac cath findingsfrom 2017. Now with intermediate stress test and somewhat atypical/typical chest pain. She was referred for cardiac cath and this is recommended again. She has already agreed after being given informed consent - The patient  understands that risks include but are not limited to stroke (1 in 1000), death (1 in 1000), kidney failure [usually temporary] (1 in 500), bleeding (1 in 200), allergic reaction [possibly serious] (1 in 200), and agrees to proceed. Will proceed this Friday with Dr. Angelena Form. Will hold her Metformin as of now. Holding Prandin and Lasix on morning of as well.   2.HTN:BP looks better - I think she is now taking her medicines as recommended.   3.GERD: -on chronic PPI  4.DM-II:uncontrolled  5.HLD with hypertriglyceridemia:most likely from poor diabetic control as well.  6.Chronic cough and chronic mild SOB: she is followed by pulmonary  7. Recent pneumonia - looks resolved.   8.Situational stress/grief - very sad situation.She remains truly heartbroken. Will be hard for her going forward.    Current medicines are reviewed with the patient today.  The patient does not have concerns regarding medicines other than what has been noted above.  The following changes have been made:  See above.  Labs/ tests ordered today include:   No orders of the defined types were placed in this encounter.    Disposition:   FU with me as planned in April.   Patient is agreeable to this plan and will call if any problems develop in the interim.   SignedTruitt Merle, NP  10/08/2017 3:08 PM  [facility] [address] [place], [place]  [address] Phone: [phone] Fax: [phone]

## 2017-10-10 ENCOUNTER — Ambulatory Visit (HOSPITAL_COMMUNITY)
Admission: RE | Admit: 2017-10-10 | Discharge: 2017-10-10 | Disposition: A | Discharge status: Home / Self Care | Payer: Medicare Other | Source: Ambulatory Visit | Attending: Cardiovascular Disease | Admitting: Cardiovascular Disease

## 2017-10-10 ENCOUNTER — Encounter (HOSPITAL_COMMUNITY)
Admission: RE | Disposition: A | Discharge status: Home / Self Care | Payer: Self-pay | Source: Ambulatory Visit | Attending: Cardiovascular Disease

## 2017-10-10 DIAGNOSIS — I2510 Atherosclerotic heart disease of native coronary artery without angina pectoris: Secondary | ICD-10-CM | POA: Diagnosis not present

## 2017-10-10 DIAGNOSIS — R05 Cough: Secondary | ICD-10-CM | POA: Insufficient documentation

## 2017-10-10 DIAGNOSIS — E781 Pure hyperglyceridemia: Secondary | ICD-10-CM | POA: Diagnosis not present

## 2017-10-10 DIAGNOSIS — R9439 Abnormal result of other cardiovascular function study: Secondary | ICD-10-CM

## 2017-10-10 DIAGNOSIS — R079 Chest pain, unspecified: Secondary | ICD-10-CM

## 2017-10-10 DIAGNOSIS — R0602 Shortness of breath: Secondary | ICD-10-CM | POA: Insufficient documentation

## 2017-10-10 DIAGNOSIS — Z888 Allergy status to other drugs, medicaments and biological substances status: Secondary | ICD-10-CM | POA: Insufficient documentation

## 2017-10-10 DIAGNOSIS — Z794 Long term (current) use of insulin: Secondary | ICD-10-CM | POA: Insufficient documentation

## 2017-10-10 DIAGNOSIS — K219 Gastro-esophageal reflux disease without esophagitis: Secondary | ICD-10-CM | POA: Diagnosis not present

## 2017-10-10 DIAGNOSIS — F4320 Adjustment disorder, unspecified: Secondary | ICD-10-CM | POA: Insufficient documentation

## 2017-10-10 DIAGNOSIS — Z7982 Long term (current) use of aspirin: Secondary | ICD-10-CM | POA: Insufficient documentation

## 2017-10-10 DIAGNOSIS — I10 Essential (primary) hypertension: Secondary | ICD-10-CM | POA: Diagnosis not present

## 2017-10-10 DIAGNOSIS — Z79899 Other long term (current) drug therapy: Secondary | ICD-10-CM | POA: Diagnosis not present

## 2017-10-10 DIAGNOSIS — Z8701 Personal history of pneumonia (recurrent): Secondary | ICD-10-CM | POA: Insufficient documentation

## 2017-10-10 DIAGNOSIS — E785 Hyperlipidemia, unspecified: Secondary | ICD-10-CM | POA: Insufficient documentation

## 2017-10-10 DIAGNOSIS — R072 Precordial pain: Secondary | ICD-10-CM

## 2017-10-10 DIAGNOSIS — E119 Type 2 diabetes mellitus without complications: Secondary | ICD-10-CM | POA: Diagnosis not present

## 2017-10-10 DIAGNOSIS — Z955 Presence of coronary angioplasty implant and graft: Secondary | ICD-10-CM | POA: Diagnosis not present

## 2017-10-10 DIAGNOSIS — F418 Other specified anxiety disorders: Secondary | ICD-10-CM | POA: Insufficient documentation

## 2017-10-10 DIAGNOSIS — Z6833 Body mass index (BMI) 33.0-33.9, adult: Secondary | ICD-10-CM | POA: Diagnosis not present

## 2017-10-10 DIAGNOSIS — I259 Chronic ischemic heart disease, unspecified: Secondary | ICD-10-CM

## 2017-10-10 DIAGNOSIS — Z9114 Patient's other noncompliance with medication regimen: Secondary | ICD-10-CM | POA: Insufficient documentation

## 2017-10-10 DIAGNOSIS — Z01810 Encounter for preprocedural cardiovascular examination: Secondary | ICD-10-CM

## 2017-10-10 HISTORY — PX: LEFT HEART CATH AND CORONARY ANGIOGRAPHY: CATH118249

## 2017-10-10 LAB — GLUCOSE, CAPILLARY: Glucose-Capillary: 198 mg/dL — ABNORMAL HIGH (ref 65–99)

## 2017-10-10 SURGERY — LEFT HEART CATH AND CORONARY ANGIOGRAPHY
Anesthesia: LOCAL

## 2017-10-10 MED ORDER — VERAPAMIL HCL 2.5 MG/ML IV SOLN
INTRAVENOUS | Status: AC
  Filled 2017-10-10: qty 2

## 2017-10-10 MED ORDER — SODIUM CHLORIDE 0.9 % IV SOLN: 250.0000 mL | INTRAVENOUS | Status: DC | PRN

## 2017-10-10 MED ORDER — SODIUM CHLORIDE 0.9% FLUSH: 3.0000 mL | INTRAVENOUS | Status: DC | PRN

## 2017-10-10 MED ORDER — MIDAZOLAM HCL 2 MG/2ML IJ SOLN
INTRAMUSCULAR | Status: AC
  Filled 2017-10-10: qty 2

## 2017-10-10 MED ORDER — VERAPAMIL HCL 2.5 MG/ML IV SOLN
INTRAVENOUS | Status: DC | PRN
  Administered 2017-10-10: 10 mL via INTRA_ARTERIAL

## 2017-10-10 MED ORDER — HEPARIN SODIUM (PORCINE) 1000 UNIT/ML IJ SOLN
INTRAMUSCULAR | Status: DC | PRN
  Administered 2017-10-10: 4000 [IU] via INTRAVENOUS

## 2017-10-10 MED ORDER — LIDOCAINE HCL (PF) 1 % IJ SOLN
INTRAMUSCULAR | Status: DC | PRN
  Administered 2017-10-10: 2 mL

## 2017-10-10 MED ORDER — IOHEXOL 350 MG/ML SOLN
INTRAVENOUS | Status: DC | PRN
  Administered 2017-10-10: 65 mL

## 2017-10-10 MED ORDER — SODIUM CHLORIDE 0.9% FLUSH: 3.0000 mL | Freq: Two times a day (BID) | INTRAVENOUS | Status: DC

## 2017-10-10 MED ORDER — LIDOCAINE HCL 1 % IJ SOLN
INTRAMUSCULAR | Status: AC
  Filled 2017-10-10: qty 20

## 2017-10-10 MED ORDER — MIDAZOLAM HCL 2 MG/2ML IJ SOLN
INTRAMUSCULAR | Status: DC | PRN
  Administered 2017-10-10 (×2): 1 mg via INTRAVENOUS

## 2017-10-10 MED ORDER — FENTANYL CITRATE (PF) 100 MCG/2ML IJ SOLN
INTRAMUSCULAR | Status: AC
  Filled 2017-10-10: qty 2

## 2017-10-10 MED ORDER — SODIUM CHLORIDE 0.9 % IV SOLN
INTRAVENOUS | Status: DC
  Administered 2017-10-10: 08:00:00 via INTRAVENOUS

## 2017-10-10 MED ORDER — HEPARIN SODIUM (PORCINE) 1000 UNIT/ML IJ SOLN
INTRAMUSCULAR | Status: AC
  Filled 2017-10-10: qty 1

## 2017-10-10 MED ORDER — HEPARIN (PORCINE) IN NACL 2-0.9 UNIT/ML-% IJ SOLN
INTRAMUSCULAR | Status: AC | PRN
  Administered 2017-10-10 (×2): 500 mL

## 2017-10-10 MED ORDER — SODIUM CHLORIDE 0.9 % IV SOLN: INTRAVENOUS | Status: AC

## 2017-10-10 MED ORDER — ONDANSETRON HCL 4 MG/2ML IJ SOLN: 4.0000 mg | Freq: Four times a day (QID) | INTRAMUSCULAR | Status: DC | PRN

## 2017-10-10 MED ORDER — ACETAMINOPHEN 325 MG PO TABS: 650.0000 mg | ORAL_TABLET | ORAL | Status: DC | PRN

## 2017-10-10 MED ORDER — FENTANYL CITRATE (PF) 100 MCG/2ML IJ SOLN
INTRAMUSCULAR | Status: DC | PRN
  Administered 2017-10-10 (×2): 25 ug via INTRAVENOUS

## 2017-10-10 MED ORDER — DIAZEPAM 5 MG PO TABS
10.0000 mg | ORAL_TABLET | ORAL | Status: AC
  Administered 2017-10-10: 10 mg via ORAL

## 2017-10-10 MED ORDER — ASPIRIN 81 MG PO CHEW: 81.0000 mg | CHEWABLE_TABLET | ORAL | Status: DC

## 2017-10-10 MED ORDER — DIAZEPAM 5 MG PO TABS
ORAL_TABLET | ORAL | Status: AC
  Administered 2017-10-10: 10 mg via ORAL
  Filled 2017-10-10: qty 2

## 2017-10-10 MED ORDER — HEPARIN (PORCINE) IN NACL 2-0.9 UNIT/ML-% IJ SOLN
INTRAMUSCULAR | Status: AC
  Filled 2017-10-10: qty 500

## 2017-10-10 SURGICAL SUPPLY — 10 items
BAND ZEPHYR COMPRESS 30 LONG (HEMOSTASIS) ×2 IMPLANT
CATH IMPULSE 5F ANG/FL3.5 (CATHETERS) ×2 IMPLANT
GUIDEWIRE INQWIRE 1.5J.035X260 (WIRE) ×1 IMPLANT
INQWIRE 1.5J .035X260CM (WIRE) ×2
KIT HEART LEFT (KITS) ×2 IMPLANT
NEEDLE PERC 21GX4CM (NEEDLE) ×2 IMPLANT
PACK CARDIAC CATHETERIZATION (CUSTOM PROCEDURE TRAY) ×2 IMPLANT
SHEATH RAIN RADIAL 21G 6FR (SHEATH) ×2 IMPLANT
TRANSDUCER W/STOPCOCK (MISCELLANEOUS) ×2 IMPLANT
TUBING CIL FLEX 10 FLL-RA (TUBING) ×2 IMPLANT

## 2017-10-10 NOTE — Discharge Instructions (Signed)
Radial Site Care Refer to this sheet in the next few weeks. These instructions provide you with information about caring for yourself after your procedure. Your health care provider may also give you more specific instructions. Your treatment has been planned according to current medical practices, but problems sometimes occur. Call your health care provider if you have any problems or questions after your procedure. What can I expect after the procedure? After your procedure, it is typical to have the following:  Bruising at the radial site that usually fades within 1-2 weeks.  Blood collecting in the tissue (hematoma) that may be painful to the touch. It should usually decrease in size and tenderness within 1-2 weeks.  Follow these instructions at home:  Take medicines only as directed by your health care provider.  You may shower 24-48 hours after the procedure or as directed by your health care provider. Remove the bandage (dressing) and gently wash the site with plain soap and water. Pat the area dry with a clean towel. Do not rub the site, because this may cause bleeding.  Do not take baths, swim, or use a hot tub until your health care provider approves.  Check your insertion site every day for redness, swelling, or drainage.  Do not apply powder or lotion to the site.  Do not flex or bend the affected arm for 24 hours or as directed by your health care provider.  Do not push or pull heavy objects with the affected arm for 24 hours or as directed by your health care provider.  Do not lift over 10 lb (4.5 kg) for 5 days after your procedure or as directed by your health care provider.  Ask your health care provider when it is okay to: ? Return to work or school. ? Resume usual physical activities or sports. ? Resume sexual activity.  Do not drive home if you are discharged the same day as the procedure. Have someone else drive you.  You may drive 24 hours after the procedure  unless otherwise instructed by your health care provider.  Do not operate machinery or power tools for 24 hours after the procedure.  If your procedure was done as an outpatient procedure, which means that you went home the same day as your procedure, a responsible adult should be with you for the first 24 hours after you arrive home.  Keep all follow-up visits as directed by your health care provider. This is important. Contact a health care provider if:  You have a fever.  You have chills.  You have increased bleeding from the radial site. Hold pressure on the site. Get help right away if:  You have unusual pain at the radial site.  You have redness, warmth, or swelling at the radial site.  You have drainage (other than a small amount of blood on the dressing) from the radial site.  The radial site is bleeding, and the bleeding does not stop after 30 minutes of holding steady pressure on the site.  Your arm or hand becomes pale, cool, tingly, or numb. This information is not intended to replace advice given to you by your health care provider. Make sure you discuss any questions you have with your health care provider. Document Released: 08/03/2010 Document Revised: 12/07/2015 Document Reviewed: 01/17/2014 Elsevier Interactive Patient Education  2018 [place] metformin for 48 hours.

## 2017-10-10 NOTE — Interval H&P Note (Signed)
History and Physical Interval Note:  10/10/2017 9:29 AM  Destiny Ballard  has presented today for cardiac cath with the diagnosis of unstable angina/abnormal stress test. The various methods of treatment have been discussed with the patient and family. After consideration of risks, benefits and other options for treatment, the patient has consented to  Procedure(s): LEFT HEART CATH AND CORONARY ANGIOGRAPHY (N/A) as a surgical intervention .  The patient's history has been reviewed, patient examined, no change in status, stable for surgery.  I have reviewed the patient's chart and labs.  Questions were answered to the patient's satisfaction.    Cath Lab Visit (complete for each Cath Lab visit)  Clinical Evaluation Leading to the Procedure:   ACS: No.  Non-ACS:    Anginal Classification: CCS III  Anti-ischemic medical therapy: Minimal Therapy (1 class of medications)  Non-Invasive Test Results: Intermediate-risk stress test findings: cardiac mortality 1-3%/year  Prior CABG: No previous CABG         Lauree Chandler

## 2017-10-13 ENCOUNTER — Other Ambulatory Visit: Payer: Self-pay | Admitting: *Deleted

## 2017-10-13 ENCOUNTER — Encounter (HOSPITAL_COMMUNITY): Payer: Self-pay | Admitting: Cardiovascular Disease

## 2017-10-13 MED FILL — Lidocaine HCl Local Inj 1%: INTRAMUSCULAR | Qty: 20 | Status: AC

## 2017-10-13 MED FILL — Heparin Sodium (Porcine) 2 Unit/ML in Sodium Chloride 0.9%: INTRAMUSCULAR | Qty: 1000 | Status: AC

## 2017-10-13 NOTE — Telephone Encounter (Signed)
-----   Message from Collene Gobble, MD sent at 10/03/2017 12:50 AM EDT ----- Yes I'll work on setting it up.  Thanks Londell Moh   ----- Message ----- From: Darreld Mclean, MD Sent: 09/29/2017   5:16 PM To: Collene Gobble, MD  Hi [name] I saw Destiny Ballard today in hospital follow-up; she was admitted with CAP and sepsis.  Doing better now, but she was discharged with home O2 and also now wants to start on CPAP at home (apparently they gave her a mask in the hospital which she wants to use, but she does not have a machine).  Is your office able to set her up with a CPAP? If you want to see her in the office and discuss when to stop the O2 that is great, but if you don't otherwise need to see her I can do this instead.  Just let me know and thank you! [name] Copland

## 2017-10-13 NOTE — Patient Outreach (Signed)
Destiny Ballard) Care Management  10/13/2017  Destiny Ballard 1950-08-04 [id number]    Transition of care (Admission for 8 hrs for a planned procedure 3/28 cath lab)   RN spoke with pt today concerning her recent admission which was planned for an 8 hr procedure in the cath lab. Pt states no blockage was found and stent remains intact. Pt continues to use her home O2 during the day when needed supplied by [employer] with no encountered issues. Pt continue to take in second hand smoke but aware of the risk with a long history and takes her tessalon tabs to help with her chronic non-productive coughing. States she has more symptoms around this time of year due to the allergies (pollen). Verified pt using her inhalers and takes all medication as prescribed. Reviewed pt's medication list and verified medications with no needed refills. Pt attending all medical appointments and currently pending a call back from Dr. Arlyn Dunning office (primary) for possible appointment with the primary office and her pulmonologist. Plan of care, goals and interventions dicussed today and adjusted accordingly to allow adherence. Will reiterated on the COPD action plan as pt continues to manage her COPD and awareness of what to do if acute symptoms should occur. Will continue to offer further involvement with community home visit however pt continue to be receptive to the ongoing transition of care calls. Will scheduled the next call next week and follow up accordingly with pt's progress.   Raina Mina, RN Care Management Coordinator [place] Office [phone]

## 2017-10-13 NOTE — Telephone Encounter (Signed)
Called her and [facility]- is all ok with her oxygen?  Is she is still using this, does she need any assistance in coming off this when appropriate?  Please let me know if she needs any assistance

## 2017-10-20 ENCOUNTER — Other Ambulatory Visit: Payer: Self-pay | Admitting: Family Medicine

## 2017-10-20 ENCOUNTER — Telehealth: Payer: Self-pay | Admitting: *Deleted

## 2017-10-20 DIAGNOSIS — G8918 Other acute postprocedural pain: Secondary | ICD-10-CM | POA: Diagnosis not present

## 2017-10-20 DIAGNOSIS — H02831 Dermatochalasis of right upper eyelid: Secondary | ICD-10-CM | POA: Diagnosis not present

## 2017-10-20 DIAGNOSIS — M79605 Pain in left leg: Secondary | ICD-10-CM

## 2017-10-20 DIAGNOSIS — H53453 Other localized visual field defect, bilateral: Secondary | ICD-10-CM | POA: Diagnosis not present

## 2017-10-20 DIAGNOSIS — H02413 Mechanical ptosis of bilateral eyelids: Secondary | ICD-10-CM | POA: Diagnosis not present

## 2017-10-20 DIAGNOSIS — H0279 Other degenerative disorders of eyelid and periocular area: Secondary | ICD-10-CM | POA: Diagnosis not present

## 2017-10-20 DIAGNOSIS — M545 Low back pain: Secondary | ICD-10-CM

## 2017-10-20 DIAGNOSIS — H02423 Myogenic ptosis of bilateral eyelids: Secondary | ICD-10-CM | POA: Diagnosis not present

## 2017-10-20 DIAGNOSIS — H02834 Dermatochalasis of left upper eyelid: Secondary | ICD-10-CM | POA: Diagnosis not present

## 2017-10-20 DIAGNOSIS — H53483 Generalized contraction of visual field, bilateral: Secondary | ICD-10-CM | POA: Diagnosis not present

## 2017-10-20 NOTE — Telephone Encounter (Signed)
Received Physician Orders/CMN Oxygen from [facility]; forwarded to provider/SLS 04/08

## 2017-10-21 ENCOUNTER — Telehealth: Payer: Self-pay | Admitting: Family Medicine

## 2017-10-21 ENCOUNTER — Other Ambulatory Visit: Payer: Self-pay | Admitting: Endocrinology

## 2017-10-21 DIAGNOSIS — E1165 Type 2 diabetes mellitus with hyperglycemia: Secondary | ICD-10-CM

## 2017-10-21 DIAGNOSIS — Z794 Long term (current) use of insulin: Principal | ICD-10-CM

## 2017-10-21 DIAGNOSIS — E118 Type 2 diabetes mellitus with unspecified complications: Secondary | ICD-10-CM

## 2017-10-21 DIAGNOSIS — IMO0002 Reserved for concepts with insufficient information to code with codable children: Secondary | ICD-10-CM

## 2017-10-21 NOTE — Telephone Encounter (Signed)
Last ov 10/17/15 1 canceled and no future scheduled refill or refuse please advise

## 2017-10-21 NOTE — Telephone Encounter (Signed)
Called her- I got some paperwork from [facility] about home oxygen.  She is still using this, but I think she can probably come off.  It sounds like she is using this instead of CPAP as she does not have CPAP that she is able to use.  Asked her to please call Dr. Lamonte Sakai and schedule to see him and discuss her CPAP and she agrees to do so

## 2017-10-21 NOTE — Telephone Encounter (Signed)
Please refill x 1 Ov is due  

## 2017-10-22 ENCOUNTER — Ambulatory Visit: Payer: Medicare Other | Admitting: Nurse Practitioner

## 2017-10-23 NOTE — Telephone Encounter (Signed)
Unsure why she is still taking robaxin.  Called her and LMOM- this is not a good medication to take long term, esp as she is also on klonopin.   Will not refill for now, please call me to discuss if she still truly needs to be taking this med

## 2017-10-23 NOTE — Telephone Encounter (Signed)
Received refill request for methocarbamol (ROBAXIN) 750 MG tablet. Last office visit 09/29/17 and last refill 09/01/17.

## 2017-10-24 ENCOUNTER — Other Ambulatory Visit: Payer: Self-pay | Admitting: *Deleted

## 2017-10-24 ENCOUNTER — Encounter: Payer: Self-pay | Admitting: *Deleted

## 2017-10-24 NOTE — Patient Outreach (Addendum)
[facility]) Care Management  10/24/2017  Destiny Ballard [birth year] [id number]    Transition of care (successful)  RN spoke with pt today and verified pt remains in the GREEN zone with no acute events or acute episodes over the last week since the last conversation. Plan of care reviewed and discussed with two goals met and increase in knowledge based continues to be extended to allow adherence. RN completed a medication review and focused on her COPD medication verifying enough refills on all her medications. Pt aware of her COPD medication and when to contact her provider with acute symptom to prevent hospital visits. Pt continue to recover from recent eye surgery and will need a consult with a pulmonologist from her primary provider to obtain a CPAP. Pt is aware and this has been discussed on the last conversation. Pt states she was not using the CPAP device last year and they removed it from the home but while in the hospital pt found a better fitting mask and now wishes to use a CPAP. Pt aware that she may need to completed another sleep study and again will request her provider to make the referral and pursue this task. Pt continues to use her home O2 as needed.  Rn will continue to encouraged adherence with the current plan of care and pt's self management of care.  Completed the initial assessment and found pt to have some depression as pt states she lost her spouse 3 months ago and it's because of this she is still mourning. RN offered moral support and counseling to help her with this however pt declined both counseling and a consult indicating she is on Prozac and this helps. RN continued to offer community case management services with home visits however pt remains receptive to the ongoing telephone contacts calls (transition of care ends today). Will follow up next month to inquired on pt's ongoing management of care related to COPD.  Raina Mina, RN Care Management  Coordinator [place] Office [phone]

## 2017-11-02 ENCOUNTER — Other Ambulatory Visit: Payer: Self-pay | Admitting: Family Medicine

## 2017-11-02 DIAGNOSIS — E876 Hypokalemia: Secondary | ICD-10-CM

## 2017-11-13 ENCOUNTER — Encounter: Payer: Self-pay | Admitting: Acute Care

## 2017-11-13 ENCOUNTER — Ambulatory Visit (INDEPENDENT_AMBULATORY_CARE_PROVIDER_SITE_OTHER): Payer: Medicare Other | Admitting: Acute Care

## 2017-11-13 VITALS — BP 126/70 | HR 71 | Ht 60.0 in | Wt 176.0 lb

## 2017-11-13 DIAGNOSIS — G4733 Obstructive sleep apnea (adult) (pediatric): Secondary | ICD-10-CM | POA: Diagnosis not present

## 2017-11-13 DIAGNOSIS — J181 Lobar pneumonia, unspecified organism: Secondary | ICD-10-CM | POA: Insufficient documentation

## 2017-11-13 DIAGNOSIS — J441 Chronic obstructive pulmonary disease with (acute) exacerbation: Secondary | ICD-10-CM

## 2017-11-13 DIAGNOSIS — I259 Chronic ischemic heart disease, unspecified: Secondary | ICD-10-CM

## 2017-11-13 DIAGNOSIS — J9601 Acute respiratory failure with hypoxia: Secondary | ICD-10-CM | POA: Diagnosis not present

## 2017-11-13 HISTORY — DX: Lobar pneumonia, unspecified organism: J18.1

## 2017-11-13 MED ORDER — ALBUTEROL SULFATE (2.5 MG/3ML) 0.083% IN NEBU: 2.5000 mg | INHALATION_SOLUTION | RESPIRATORY_TRACT | 12 refills | Status: DC | PRN

## 2017-11-13 MED ORDER — MONTELUKAST SODIUM 10 MG PO TABS: 10.0000 mg | ORAL_TABLET | Freq: Every day | ORAL | 1 refills | Status: DC

## 2017-11-13 NOTE — Patient Instructions (Addendum)
It is nice to meet you today, We will place an order for a new CPAP machine. Yoill send an order in for albuterol nebs for your neb machine. Contiu may need a new sleep study. Get yourself a saturation probe. ( [place] or [employer] or [employer]) Saturation goals are 88-92% We wnue Spiriva 2 puffs once daily Rinse mouth after use Continue Singulair as you have been doing. We will send in a prescription. Nasal saline for nasal congestion as needed. Note your daily symptoms > remember "red flags" for COPD:  Increase in cough, increase in sputum production, increase in shortness of breath or activity tolerance. If you notice these symptoms, please call to be seen.   Once we have a CPAP machine you will need to come in with a download after 30-90 days. Please contact office for sooner follow up if symptoms do not improve or worsen or seek emergency care

## 2017-11-13 NOTE — Progress Notes (Signed)
History of Present Illness Destiny Ballard is a 67 y.o. female  never smoker with chronic cough in setting of rhinitis, and mixed obstruction and restriction on PFT's, OSA not able to tolerate CPAP. She is followed by Dr. Lamonte Sakai.  Synopsis: 67 year old woman, never smoker with a history of chronic cough in the setting of rhinitis and GERD.  She also has mixed obstruction and restriction on pulmonary function testing.  Finally she has obstructive sleep apnea.  We have attempted to get her on CPAP but she has been unable to tolerate to date, often due to cough.  She was formally on Protonix, not currently. She does use tums prn. Sleeps with her head up.  Feels that her reflux has been doing pretty well. She does take Singulair qam and fluticasone nasal spray as needed, zyrtec daily. She uses albuterol prn, describes increased wheeze last two weeks with increased albuterol use. She uses benzonatate every day.  Unfortunately her husband died recently and this is been very stressful, emotional.  She gets tearful which increases her drainage to her throat, seems to impact her cough.She had a recent admission 3/6-3/11 for pneumonia, sepsis and COPD exacerbation. Maintenance Medications: Spiriva singulair Albuterol nebs prn   11/13/2017  Hospital Follow up: Pt. Presents for hospital follow up. She was hospitalized 3/6/2019with cough and progressive shortness of breath and was found to have sepsis secondary to multifocal pneumonia complicated by acute exacerbation of COPD. She was treated with empiric IV ceftriaxone and azithromycin is also transition to oral Vantin and azithromycin, IV steroids, Scheduled BD and oxygen. She improved and was discharged 09/22/2017 with  Vantin and azithromycin for 2 more days on (3/12-3/13); continuing  methylprednisolone for 2 days ( 3/12-3/13).She was discharged with a neb machine and albuterol nebs.She declined CPAP machine during hospital stay.. She is to continue  maintenance medications of Spiriva, supportative care of Tessalon Perles, IS, Flutter valve. She was sent home on home oxygen.She states she was compliant with her antibiotic and prednisone. She is still using her home oxygen every now and again. She is compliant with her Spiriva and Singulair.She does not have extra secretions or purulent secretions. She states she is using her neb treatments about once daily. She states these are very helpful.She denies fever or chest pain. She had follow up with PCP and CXR shows resolution of pneumonia.She states she has occasional post nasal gtt. She denies fever , chest pain, orthopnea or hemoptysis.She is not using her IS or Flutter at present. She did use a CPAP machine in the hospital. She was given a new mask and she states she feels much better when she wears it. She does not have a machine at home. We will place an order, but she might need a new sleep study. ( Last done 03/2016)   Test Results:  CXR 09/29/2017 IMPRESSION: Resolved right lung pneumonia.  No new cardiopulmonary abnormality.  Microbiology:   3/6- blood NGTD  Sputum culture negative    CBC Latest Ref Rng & Units 10/06/2017 09/22/2017 09/21/2017  WBC 3.4 - 10.8 x10E3/uL 9.5 11.0(H) 11.9(H)  Hemoglobin 11.1 - 15.9 g/dL 12.3 10.3(L) 10.3(L)  Hematocrit 34.0 - 46.6 % 38.6 32.9(L) 31.9(L)  Platelets 150 - 379 x10E3/uL 320 211 PLATELET CLUMPS NOTED ON SMEAR, UNABLE TO ESTIMATE    BMP Latest Ref Rng & Units 10/06/2017 09/21/2017 09/20/2017  Glucose 65 - 99 mg/dL 189(H) 329(H) 263(H)  BUN 8 - 27 mg/dL 17 22(H) 11  Creatinine 0.57 - 1.00 mg/dL  0.69 0.77 0.60  BUN/Creat Ratio 12 - 28 25 - -  Sodium 134 - 144 mmol/L 139 137 136  Potassium 3.5 - 5.2 mmol/L 3.9 4.3 4.3  Chloride 96 - 106 mmol/L 97 96(L) 96(L)  CO2 20 - 29 mmol/L 26 31 27   Calcium 8.7 - 10.3 mg/dL 9.7 9.5 8.9    BNP    Component Value Date/Time   BNP 45.7 09/18/2017 0348   BNP 5.7 02/10/2013 1418    ProBNP      Component Value Date/Time   PROBNP 15.0 05/10/2013 1432    PFT    Component Value Date/Time   FEV1PRE 1.06 04/25/2016 1044   FEV1POST 1.10 04/25/2016 1044   FVCPRE 1.33 04/25/2016 1044   FVCPOST 1.29 04/25/2016 1044   TLC 3.77 04/25/2016 1044   DLCOUNC 16.33 04/25/2016 1044   PREFEV1FVCRT 80 04/25/2016 1044   PSTFEV1FVCRT 85 04/25/2016 1044    No results found.   Past medical hx Past Medical History:  Diagnosis Date  . Anxiety    Prior suicide attempt  . CAD (coronary artery disease)    a) s/p DES to LAD 07/2005 b) Last Myoview low risk 11/2011 showing small fixed apical perfusion defect (prior MI vs attenuation) but no ischemia - normal EF.  [name] Cervical spondylosis   . Coronary atherosclerosis 06/28/2008  . Depression   . Depression with anxiety   . Diabetes mellitus without complication ([place])   . GERD (gastroesophageal reflux disease)   . Hyperlipidemia   . Hypertension   . Insulin resistance   . Iron deficiency anemia   . Obesity      Social History   Tobacco Use  . Smoking status: Never Smoker  . Smokeless tobacco: Never Used  Substance Use Topics  . Alcohol use: Yes    Comment: occ  . Drug use: No    Destiny Ballard reports that she has never smoked. She has never used smokeless tobacco. She reports that she drinks alcohol. She reports that she does not use drugs.  Tobacco Cessation: Never smoker, but passive smoke exposure " All my life"  Past surgical hx, Family hx, Social hx all reviewed.  Current Outpatient Medications on File Prior to Visit  Medication Sig  . acetaminophen (TYLENOL) 650 MG CR tablet Take 1,300 mg by mouth every 8 (eight) hours as needed for pain.  [name] albuterol (PROAIR HFA) 108 (90 Base) MCG/ACT inhaler Inhale 2 puffs into the lungs every 4 (four) hours as needed for wheezing or shortness of breath.  [name] albuterol (PROVENTIL) (2.5 MG/3ML) 0.083% nebulizer solution Take 3 mLs (2.5 mg total) by nebulization every 4 (four) hours as needed  for wheezing or shortness of breath.  [name] aspirin EC 81 MG tablet Take 81 mg by mouth daily.  . benzonatate (TESSALON) 100 MG capsule Take 1 capsule (100 mg total) by mouth 3 (three) times daily as needed for cough.  . calcium carbonate (TUMS - DOSED IN MG ELEMENTAL CALCIUM) 500 MG chewable tablet Chew 2 tablets by mouth daily as needed for indigestion or heartburn.  . cetirizine (ZYRTEC) 10 MG tablet TAKE 10 MG BY MOUTH DAILY  . Cholecalciferol (VITAMIN D3) 1000 UNITS CAPS Take 1,000 Units by mouth daily.   . clonazePAM (KLONOPIN) 0.5 MG tablet TAKE 0.5 TABLETS (0.25 MG TOTAL) BY MOUTH 2 TIMES DAILY AS NEEDED FOR ANXIETY (Patient taking differently: TAKE 0.5 MG BY MOUTH 2 TIMES DAILY AS NEEDED FOR ANXIETY)  . CRESTOR 20 MG tablet TAKE 1 TABLET BY MOUTH  EVERY DAY (Patient taking differently: TAKE 20 MG BY MOUTH EVERY DAY)  . diphenhydramine-acetaminophen (TYLENOL PM) 25-500 MG TABS Take 2 tablets by mouth at bedtime as needed (for sleep).   [name] FLUoxetine (PROZAC) 40 MG capsule Take 1 capsule (40 mg total) by mouth daily.  . fluticasone (FLONASE) 50 MCG/ACT nasal spray Place 2 sprays into both nostrils daily. (Patient taking differently: Place 2 sprays into both nostrils daily as needed for allergies. )  . furosemide (LASIX) 20 MG tablet TAKE ONE TABLET DAILY IF NEEDED FOR LEG SWELLING (Patient taking differently: TAKE 20 MG BY MOUTH DAILY)  . gabapentin (NEURONTIN) 300 MG capsule Take 600 mg by mouth 2 (two) times daily.   . hydrALAZINE (APRESOLINE) 25 MG tablet Take 2 tablets (50 mg total) by mouth 3 (three) times daily.  . hydrOXYzine (ATARAX/VISTARIL) 25 MG tablet Take 1 tablet (25 mg total) by mouth at bedtime.  [name] ibuprofen (ADVIL,MOTRIN) 200 MG tablet Take 800 mg by mouth every 6 (six) hours as needed for headache or moderate pain.   . Insulin Detemir (LEVEMIR FLEXTOUCH) 100 UNIT/ML Pen Inject 10 Units into the skin daily.  [name] KLOR-CON 10 10 MEQ tablet TAKE 1 TABLET (10 MEQ TOTAL) BY MOUTH DAILY.  TAKE ONLY ON DAYS THAT YOU USE LASIX/ FUROSEMIDE  . losartan (COZAAR) 100 MG tablet Take 1 tablet (100 mg total) by mouth daily.  [name] lovastatin (MEVACOR) 20 MG tablet Take 1 tablet (20 mg total) by mouth at bedtime.  . metFORMIN (GLUCOPHAGE-XR) 500 MG 24 hr tablet TAKE 2 TABLETS BY MOUTH TWICE A DAY  . methocarbamol (ROBAXIN) 750 MG tablet TAKE 1 TABLET BY MOUTH EVERY 12 HOURS AS NEEDED FOR MUSCLE SPASMS (Patient taking differently: Take 750 mg by mouth every 12 (twelve) hours as needed for muscle spasms. )  . metoprolol succinate (TOPROL-XL) 100 MG 24 hr tablet Take 1 tablet (100 mg total) by mouth daily.  . montelukast (SINGULAIR) 10 MG tablet Take 1 tablet (10 mg total) by mouth at bedtime.  . nitroGLYCERIN (NITROSTAT) 0.4 MG SL tablet Place 1 tablet (0.4 mg total) under the tongue every 5 (five) minutes as needed for chest pain.  [name] omega-3 acid ethyl esters (LOVAZA) 1 g capsule Take 1 capsule (1 g total) by mouth 2 (two) times daily.  . Polyvinyl Alcohol-Povidone (REFRESH OP) Place 2 drops into both eyes daily as needed (for dry eyes).  . repaglinide (PRANDIN) 2 MG tablet Take 1 tablet (2 mg total) by mouth 3 (three) times daily before meals.  . Tiotropium Bromide Monohydrate (SPIRIVA RESPIMAT) 2.5 MCG/ACT AERS Inhale 2 puffs into the lungs daily.   No current facility-administered medications on file prior to visit.      Allergies  Allergen Reactions  . Prednisone Other (See Comments)    REACTION: mood swings, nightmares. "Shot doesn't bother me, reaction is just with the pill" she states she has had the steroid injections before. From our records methylprednisone was given to her in 2013 without any complications.    Review Of Systems:  Constitutional:   +  weight loss, night sweats,  Fevers, chills,+ fatigue, or  lassitude.  HEENT:   No headaches,  Difficulty swallowing,  Tooth/dental problems, or  Sore throat,                No sneezing, itching, ear ache, nasal congestion,  occasional post nasal drip,   CV:  No chest pain,  Orthopnea, PND, swelling in lower extremities, anasarca, dizziness, palpitations, syncope.  GI  No heartburn, indigestion, abdominal pain, nausea, vomiting, diarrhea, change in bowel habits, loss of appetite, bloody stools.   Resp: + shortness of breath with exertion not  at rest.  No excess mucus, no productive cough,  No non-productive cough,  No coughing up of blood.  No change in color of mucus.  No wheezing.  No chest wall deformity  Skin: no rash or lesions.  GU: no dysuria, change in color of urine, no urgency or frequency.  No flank pain, no hematuria   MS:  No joint pain or swelling.  No decreased range of motion.  No back pain.  Psych:  No change in mood or affect. No depression or anxiety.  No memory loss.   Vital Signs BP 126/70 (BP Location: Left Arm, Cuff Size: Normal)   Pulse 71   Ht 5' (1.524 m)   Wt 176 lb (79.8 kg)   SpO2 93%   BMI 34.37 kg/m    Physical Exam:  General- No distress,  A&Ox3, pleasant ENT: No sinus tenderness, TM clear, pale nasal mucosa, no oral exudate,no post nasal drip, no LAN Cardiac: S1, S2, regular rate and rhythm, no murmur Chest: No wheeze/ rales/ dullness; no accessory muscle use, no nasal flaring, no sternal retractions Abd.: Soft Non-tender Ext: No clubbing cyanosis, edema Neuro:  normal strength Skin: No rashes, warm and dry Psych: normal mood and behavior   Assessment/Plan  Acute respiratory failure with hypoxia (HCC) Wearing oxygen prn Maintain sats 88-92%  COPD with acute exacerbation (HCC) Well controlled on Spiriva and Singulair Plan: We will place an order for a new CPAP machine. Yoill send an order in for albuterol nebs for your neb machine. Contiu may need a new sleep study. Get yourself a saturation probe. ( [place] or [employer] or [employer]) Saturation goals are 88-92% We wnue Spiriva 2 puffs once daily Rinse mouth after use Continue Singulair as you have  been doing. We will send in a prescription. Nasal saline for nasal congestion as needed. Note your daily symptoms > remember "red flags" for COPD:  Increase in cough, increase in sputum production, increase in shortness of breath or activity tolerance. If you notice these symptoms, please call to be seen.   Follow up with Dr. Lamonte Sakai in 3 months    OSA (obstructive sleep apnea) Had not been wearing CPAP due to mask issues. When in hospital mask issues were resolved. Wants to start wearing CPAP again Last sleep study 03/2016 Plan: We will place an order for a new CPAP machine. You  may need a new sleep study. Continue on CPAP at bedtime when you get your machine Goal is to wear for at least 6 hours each night for maximal clinical benefit. Continue to work on weight loss, as the link between excess weight  and sleep apnea is well established.  Do not drive if sleepy. Remember to clean mask, tubing, filter, and reservoir once weekly with soapy water.  Follow up with Dr. Halford Chessman  In 3 months  or before as needed.  Once we have a CPAP machine you will need to come in with a download after 30-90 days. Please contact office for sooner follow up if symptoms do not improve or worsen or seek emergency care      Lobar pneumonia, unspecified organism ([place]) Resolved per CXR post hospitalization. No Clinical S/S     Magdalen Spatz, NP 11/13/2017  9:44 AM

## 2017-11-13 NOTE — Assessment & Plan Note (Signed)
Well controlled on Spiriva and Singulair Plan: We will place an order for a new CPAP machine. Yoill send an order in for albuterol nebs for your neb machine. Contiu may need a new sleep study. Get yourself a saturation probe. ( [place] or [employer] or [employer]) Saturation goals are 88-92% We wnue Spiriva 2 puffs once daily Rinse mouth after use Continue Singulair as you have been doing. We will send in a prescription. Nasal saline for nasal congestion as needed. Note your daily symptoms > remember "red flags" for COPD:  Increase in cough, increase in sputum production, increase in shortness of breath or activity tolerance. If you notice these symptoms, please call to be seen.   Follow up with Dr. Lamonte Sakai in 3 months

## 2017-11-13 NOTE — Assessment & Plan Note (Signed)
Wearing oxygen prn Maintain sats 88-92%

## 2017-11-13 NOTE — Assessment & Plan Note (Signed)
Had not been wearing CPAP due to mask issues. When in hospital mask issues were resolved. Wants to start wearing CPAP again Last sleep study 03/2016 Plan: We will place an order for a new CPAP machine. You  may need a new sleep study. Continue on CPAP at bedtime when you get your machine Goal is to wear for at least 6 hours each night for maximal clinical benefit. Continue to work on weight loss, as the link between excess weight  and sleep apnea is well established.  Do not drive if sleepy. Remember to clean mask, tubing, filter, and reservoir once weekly with soapy water.  Follow up with Dr. Halford Chessman  In 3 months  or before as needed.  Once we have a CPAP machine you will need to come in with a download after 30-90 days. Please contact office for sooner follow up if symptoms do not improve or worsen or seek emergency care

## 2017-11-13 NOTE — Assessment & Plan Note (Signed)
Resolved per CXR post hospitalization. No Clinical S/S

## 2017-11-17 ENCOUNTER — Ambulatory Visit (INDEPENDENT_AMBULATORY_CARE_PROVIDER_SITE_OTHER): Payer: Medicare Other | Admitting: Nurse Practitioner

## 2017-11-17 ENCOUNTER — Encounter: Payer: Self-pay | Admitting: Nurse Practitioner

## 2017-11-17 VITALS — BP 138/60 | HR 100 | Ht 60.0 in | Wt 177.4 lb

## 2017-11-17 DIAGNOSIS — Z9889 Other specified postprocedural states: Secondary | ICD-10-CM

## 2017-11-17 DIAGNOSIS — I259 Chronic ischemic heart disease, unspecified: Secondary | ICD-10-CM

## 2017-11-17 NOTE — Progress Notes (Addendum)
CARDIOLOGY OFFICE NOTE  Date:  11/17/2017    Destiny Ballard Date of Birth: [birth year] Medical Record #[id number]  PCP:  Darreld Mclean, MD  Cardiologist:  Servando Snare   Chief Complaint  Patient presents with  . Coronary Artery Disease    Post cath visit     History of Present Illness: Destiny Ballard is a 67 y.o. female who presents today for a post cath/follow up visit. Former patient of Dr. Susa Simmonds and Dr. Claris Gladden - has primarily followed with me over the past few years.   She has amedical history significant of hypertension, hyperlipidemia, diabetes mellitus, GERD, anxiety, morbid obesity,&CAD, s/p stent placement 2007.Her last cath was in 2017and was stable. She has had a tendency towards medication non compliance.  I saw her back in February as a work in - her husband "[name]" died right after Christmas. Things have been pretty hard for her.Lots of stress/sadness.Some chest painreported. Short of breath. Got her Myoview updated - intermediate study.Saw her back and arranged cardiac cath. On her labs, her WBC was elevated - she has had a chronic cough for many years - but ended up having pneumonia - was admitted. Cardiac cath was postponed until she was improved - ended up being done in March - stable findings - see below. She will continue with medical management.   Comes in today. Herealone. She is doing ok. Not as tearful today. No chest pain. Breathing is stable. Cough under better control. BP looks better. She was hoping to start getting off some medicines. May be considering a trip to [place] later this month. One daughter having brain surgery this week - she plans on helping her out. She is trying to keep busy.   Past Medical History:  Diagnosis Date  . Anxiety    Prior suicide attempt  . CAD (coronary artery disease)    a) s/p DES to LAD 07/2005 b) Last Myoview low risk 11/2011 showing small fixed apical perfusion defect (prior MI vs attenuation)  but no ischemia - normal EF.  [name] Cervical spondylosis   . Coronary atherosclerosis 06/28/2008  . Depression   . Depression with anxiety   . Diabetes mellitus without complication ([place])   . GERD (gastroesophageal reflux disease)   . Hyperlipidemia   . Hypertension   . Insulin resistance   . Iron deficiency anemia   . Obesity     Past Surgical History:  Procedure Laterality Date  . BREAST ENHANCEMENT SURGERY    . CARDIAC CATHETERIZATION  06/17/2007   NORMAL. EF 60%  . CARDIAC CATHETERIZATION N/A 01/29/2016   Procedure: Left Heart Cath and Coronary Angiography;  Surgeon: Sherren Mocha, MD;  Location: [facility];  Service: Cardiovascular;  Laterality: N/A;  . CERVICAL SPONDYLOSIS     SINGLE LEVEL FUSION  . CHILDBIRTH     X3  . CORONARY STENT PLACEMENT  07/2005   LEFT ANTERIOR DESCENDING  . FOREARM FRACTURE SURGERY  2010   hand and shoulder   . INCISION AND DRAINAGE BREAST ABSCESS  01/05/2012      . INCISION AND DRAINAGE PERIRECTAL ABSCESS N/A 02/18/2014   Procedure: IRRIGATION AND DEBRIDEMENT PERIRECTAL ABSCESS;  Surgeon: Pedro Earls, MD;  Location: [facility];  Service: General;  Laterality: N/A;  . LEFT HEART CATH AND CORONARY ANGIOGRAPHY N/A 10/10/2017   Procedure: LEFT HEART CATH AND CORONARY ANGIOGRAPHY;  Surgeon: Burnell Blanks, MD;  Location: [facility];  Service: Cardiovascular;  Laterality: N/A;  .  LUMBAR LAMINECTOMY    . ROTATOR CUFF REPAIR     bilaterla  . TONSILLECTOMY AND ADENOIDECTOMY    . TUBAL LIGATION    . VIDEO BRONCHOSCOPY Bilateral 08/13/2016   Procedure: VIDEO BRONCHOSCOPY WITHOUT FLUORO;  Surgeon: Collene Gobble, MD;  Location: [facility];  Service: Cardiopulmonary;  Laterality: Bilateral;     Medications: Current Meds  Medication Sig  . acetaminophen (TYLENOL) 650 MG CR tablet Take 1,300 mg by mouth every 8 (eight) hours as needed for pain.  [name] albuterol (PROAIR HFA) 108 (90 Base) MCG/ACT inhaler Inhale 2 puffs into the lungs  every 4 (four) hours as needed for wheezing or shortness of breath.  [name] albuterol (PROVENTIL) (2.5 MG/3ML) 0.083% nebulizer solution Take 3 mLs (2.5 mg total) by nebulization every 4 (four) hours as needed for wheezing or shortness of breath.  [name] aspirin EC 81 MG tablet Take 81 mg by mouth daily.  . benzonatate (TESSALON) 100 MG capsule Take 1 capsule (100 mg total) by mouth 3 (three) times daily as needed for cough.  . calcium carbonate (TUMS - DOSED IN MG ELEMENTAL CALCIUM) 500 MG chewable tablet Chew 2 tablets by mouth daily as needed for indigestion or heartburn.  . cetirizine (ZYRTEC) 10 MG tablet TAKE 10 MG BY MOUTH DAILY  . Cholecalciferol (VITAMIN D3) 1000 UNITS CAPS Take 1,000 Units by mouth daily.   . clonazePAM (KLONOPIN) 0.5 MG tablet TAKE 0.5 TABLETS (0.25 MG TOTAL) BY MOUTH 2 TIMES DAILY AS NEEDED FOR ANXIETY (Patient taking differently: TAKE 0.5 MG BY MOUTH 2 TIMES DAILY AS NEEDED FOR ANXIETY)  . CRESTOR 20 MG tablet TAKE 1 TABLET BY MOUTH EVERY DAY (Patient taking differently: TAKE 20 MG BY MOUTH EVERY DAY)  . diphenhydramine-acetaminophen (TYLENOL PM) 25-500 MG TABS Take 2 tablets by mouth at bedtime as needed (for sleep).   [name] FLUoxetine (PROZAC) 40 MG capsule Take 1 capsule (40 mg total) by mouth daily.  . fluticasone (FLONASE) 50 MCG/ACT nasal spray Place 2 sprays into both nostrils daily. (Patient taking differently: Place 2 sprays into both nostrils daily as needed for allergies. )  . furosemide (LASIX) 20 MG tablet TAKE ONE TABLET DAILY IF NEEDED FOR LEG SWELLING (Patient taking differently: TAKE 20 MG BY MOUTH DAILY)  . gabapentin (NEURONTIN) 300 MG capsule Take 600 mg by mouth 2 (two) times daily.   . hydrALAZINE (APRESOLINE) 25 MG tablet Take 2 tablets (50 mg total) by mouth 3 (three) times daily.  . hydrOXYzine (ATARAX/VISTARIL) 25 MG tablet Take 1 tablet (25 mg total) by mouth at bedtime.  [name] ibuprofen (ADVIL,MOTRIN) 200 MG tablet Take 800 mg by mouth every 6 (six) hours as  needed for headache or moderate pain.   . Insulin Detemir (LEVEMIR FLEXTOUCH) 100 UNIT/ML Pen Inject 10 Units into the skin daily.  [name] KLOR-CON 10 10 MEQ tablet TAKE 1 TABLET (10 MEQ TOTAL) BY MOUTH DAILY. TAKE ONLY ON DAYS THAT YOU USE LASIX/ FUROSEMIDE  . losartan (COZAAR) 100 MG tablet Take 1 tablet (100 mg total) by mouth daily.  [name] lovastatin (MEVACOR) 20 MG tablet Take 1 tablet (20 mg total) by mouth at bedtime.  . metFORMIN (GLUCOPHAGE-XR) 500 MG 24 hr tablet TAKE 2 TABLETS BY MOUTH TWICE A DAY  . methocarbamol (ROBAXIN) 750 MG tablet TAKE 1 TABLET BY MOUTH EVERY 12 HOURS AS NEEDED FOR MUSCLE SPASMS (Patient taking differently: Take 750 mg by mouth every 12 (twelve) hours as needed for muscle spasms. )  . metoprolol succinate (TOPROL-XL) 100  MG 24 hr tablet Take 1 tablet (100 mg total) by mouth daily.  . montelukast (SINGULAIR) 10 MG tablet Take 1 tablet (10 mg total) by mouth at bedtime.  . nitroGLYCERIN (NITROSTAT) 0.4 MG SL tablet Place 1 tablet (0.4 mg total) under the tongue every 5 (five) minutes as needed for chest pain.  [name] omega-3 acid ethyl esters (LOVAZA) 1 g capsule Take 1 capsule (1 g total) by mouth 2 (two) times daily.  . Polyvinyl Alcohol-Povidone (REFRESH OP) Place 2 drops into both eyes daily as needed (for dry eyes).  . Tiotropium Bromide Monohydrate (SPIRIVA RESPIMAT) 2.5 MCG/ACT AERS Inhale 2 puffs into the lungs daily.     Allergies: Allergies  Allergen Reactions  . Prednisone Other (See Comments)    REACTION: mood swings, nightmares. "Shot doesn't bother me, reaction is just with the pill" she states she has had the steroid injections before. From our records methylprednisone was given to her in 2013 without any complications.    Social History: The patient  reports that she has never smoked. She has never used smokeless tobacco. She reports that she drinks alcohol. She reports that she does not use drugs.   Family History: The patient's family history includes  Allergies in her other; Asthma in her daughter and mother; Cancer in her daughter and sister; Cervical cancer in her daughter; Diabetes in her mother and sister; Heart attack in her mother; Heart disease in her mother; Lung cancer in her mother; Suicidality in her father.   Review of Systems: Please see the history of present illness.   Otherwise, the review of systems is positive for none.   All other systems are reviewed and negative.   Physical Exam: VS:  BP 138/60 (BP Location: Left Arm, Patient Position: Sitting, Cuff Size: Normal)   Pulse 100   Ht 5' (1.524 m)   Wt 177 lb 6.4 oz (80.5 kg)   SpO2 96% Comment: at rest  BMI 34.65 kg/m  .  BMI Body mass index is 34.65 kg/m.  Wt Readings from Last 3 Encounters:  11/17/17 177 lb 6.4 oz (80.5 kg)  11/13/17 176 lb (79.8 kg)  10/10/17 172 lb (78 kg)    General: Pleasant. Obese. Alert and in no acute distress.   HEENT: Normal.  Neck: Supple, no JVD, carotid bruits, or masses noted.  Cardiac: Regular rate and rhythm. Soft outflow murmur. No edema.  Respiratory:  Lungs are clear to auscultation bilaterally with normal work of breathing.  GI: Soft and nontender.  MS: No deformity or atrophy. Gait and ROM intact.  Skin: Warm and dry. Color is normal.  Neuro:  Strength and sensation are intact and no gross focal deficits noted.  Psych: Alert, appropriate and with normal affect.   LABORATORY DATA:  EKG:  EKG is not ordered today.  Lab Results  Component Value Date   WBC 9.5 10/06/2017   HGB 12.3 10/06/2017   HCT 38.6 10/06/2017   PLT 320 10/06/2017   GLUCOSE 189 (H) 10/06/2017   CHOL 239 (H) 04/03/2017   TRIG 389.0 (H) 04/03/2017   HDL 52.20 04/03/2017   LDLDIRECT 100.0 04/03/2017   LDLCALC UNABLE TO CALCULATE IF TRIGLYCERIDE OVER 400 mg/dL 01/27/2016   ALT 18 09/17/2017   AST 31 09/17/2017   NA 139 10/06/2017   K 3.9 10/06/2017   CL 97 10/06/2017   CREATININE 0.69 10/06/2017   BUN 17 10/06/2017   CO2 26 10/06/2017    TSH 2.361 01/27/2016   INR 0.9 10/06/2017  HGBA1C 8.2 (H) 09/01/2017   MICROALBUR <0.7 07/24/2016     BNP (last 3 results) Recent Labs    09/18/17 0348  BNP 45.7    ProBNP (last 3 results) No results for input(s): PROBNP in the last 8760 hours.   Other Studies Reviewed Today:  Cardiac Cath Conclusion 09/2017    Prox RCA lesion is 20% stenosed.  Dist RCA lesion is 20% stenosed.  Ost 2nd Mrg to 2nd Mrg lesion is 40% stenosed.  Prox Cx lesion is 20% stenosed.  Ost LAD to Prox LAD lesion is 10% stenosed.  Prox LAD to Dist LAD lesion is 20% stenosed.   1. Patent stent mid LAD with minimal restenosis.  2. Mild non-obstructive disease in the RCA, Circumflex and distal LAD  Recommendations: Continue medical management of CAD.       MyoviewStudy Highlights2/2019    Nuclear stress EF: 64%.  There was no ST segment deviation noted during stress.  Findings consistent with ischemia.  This is an intermediate risk study.  The left ventricular ejection fraction is normal (55-65%).  1. EF 64%, normal wall motion.  2. Partially reversible medium-sized, moderate intensity mid to apical anteroseptal and apical anterior perfusion defect. This is concerning for ischemia.   Intermediate risk study.      EchoStudy Conclusions2/2019  - Left ventricle: The cavity size was normal. There was moderate concentric hypertrophy. Systolic function was normal. The estimated ejection fraction was in the range of 60% to 65%. Wall motion was normal; there were no regional wall motion abnormalities. There was an increased relative contribution of atrial contraction to ventricular filling. Doppler parameters are consistent with abnormal left ventricular relaxation (grade 1 diastolic dysfunction). - Aortic valve: Valve area (VTI): 1.97 cm^2. Valve area (Vmax): 1.67 cm^2. Valve area (Vmean): 1.71 cm^2. - Left atrium: The atrium was mildly dilated. -  Pulmonary arteries: Systolic pressure could not be accurately estimated.    Assessment/Plan:  1.Chest pain and CAD: s/p of stent 2007.Recent cath from March with stable findings - needs aggressive CV risk factor modification - really encouraged her to start focusing on her well being/health. Regular exercise encouraged.   2.HTN:BP ok - do not see where we could cut any medicines back at this time but I explained to her that with successful CV risk factor modification this may be possible going forward.   3.GERD: -on chronic PPI therapy  4.DM-II:uncontrolled - managed by PCP  5.HLD - on multiple agents.   6.Chronic cough and chronic mild SOB: she is followed by pulmonary - seems better.   7. Situational stress/grief - she is not as tearful today but I still get the the impression that this is pretty hard for her.   Current medicines are reviewed with the patient today.  The patient does not have concerns regarding medicines other than what has been noted above.  The following changes have been made:  See above.  Labs/ tests ordered today include:   No orders of the defined types were placed in this encounter.    Disposition:   FU with me in 6 months with fasting labs.   Patient is agreeable to this plan and will call if any problems develop in the interim.   SignedTruitt Merle, NP  11/17/2017 9:06 AM  [facility] [address] [place], [place]  [address] Phone: [phone] Fax: [phone]

## 2017-11-17 NOTE — Patient Instructions (Addendum)
We will be checking the following labs today - NONE   Medication Instructions:    Continue with your current medicines.     Testing/Procedures To Be Arranged:  N/A  Follow-Up:   See me in 6 months with fasting labs    Other Special Instructions:   N/A    If you need a refill on your cardiac medications before your next appointment, please call your pharmacy.   Call the [facility] office at [phone] if you have any questions, problems or concerns.      

## 2017-11-24 ENCOUNTER — Other Ambulatory Visit: Payer: Self-pay | Admitting: *Deleted

## 2017-11-24 NOTE — Patient Outreach (Signed)
[facility]) Care Management  11/24/2017  Destiny Ballard 1950-08-21 [id number]    Telephone Assessment  RN spoke with pt today and inquired once again on her ongoing management of care related to her COPD. Pt remains aware of the signs/symptoms and what to do if acute symptoms should occur. Pt states she continue to use her home O2 on 2 liters and currently her provider is working on getting her a CPAP by working with the involved agency. Pt states she continue to use her inhalers and nebulizer as ordered. Pt confirms she is in the GREEN zone and trying to stay in away from the pollen and allergies that may flare up her COPD symptoms. Plan of care discussed with goals and RN updated interventions according to pt's progress. Will continue telephone follow ups with another call next month prior to case closure due to pt's adherence in managing her COPD. No request or issues presented at this time. Will scheduled the last call for June as pt remains very receptive to this plan of care today.   Raina Mina, RN Care Management Coordinator [facility] [phone]

## 2017-11-30 DIAGNOSIS — G4733 Obstructive sleep apnea (adult) (pediatric): Secondary | ICD-10-CM | POA: Diagnosis not present

## 2017-12-01 DIAGNOSIS — G4733 Obstructive sleep apnea (adult) (pediatric): Secondary | ICD-10-CM | POA: Diagnosis not present

## 2017-12-02 ENCOUNTER — Telehealth: Payer: Self-pay | Admitting: Acute Care

## 2017-12-02 DIAGNOSIS — G4733 Obstructive sleep apnea (adult) (pediatric): Secondary | ICD-10-CM

## 2017-12-02 NOTE — Telephone Encounter (Signed)
Advised pt of results. Pt understood and nothing further is needed.   CPAP ordered.  

## 2017-12-02 NOTE — Telephone Encounter (Signed)
Please call patient. Let her know her sleep study noted moderate OSA. Please place order for new CPAP machine. It was previously set at 13 cm H2O. Have new machine set at auto set 5-15 cm H2O. Have her follow up with down Load 30-90 days after starting usage of new machine with Dr. Lamonte Sakai or NP. She has a mask she likes ( She was given the mask in the hospital) Thanks so much.   Home sleep study from 11/30/17 showed moderate obstructive sleep apnea with an AHI of 25.3 and SaO2 low of 67%.

## 2017-12-03 ENCOUNTER — Other Ambulatory Visit: Payer: Self-pay | Admitting: Family Medicine

## 2017-12-03 ENCOUNTER — Other Ambulatory Visit: Payer: Self-pay | Admitting: *Deleted

## 2017-12-03 DIAGNOSIS — L299 Pruritus, unspecified: Secondary | ICD-10-CM

## 2017-12-03 DIAGNOSIS — G4733 Obstructive sleep apnea (adult) (pediatric): Secondary | ICD-10-CM

## 2017-12-08 ENCOUNTER — Other Ambulatory Visit: Payer: Self-pay

## 2017-12-08 ENCOUNTER — Encounter (HOSPITAL_BASED_OUTPATIENT_CLINIC_OR_DEPARTMENT_OTHER): Payer: Self-pay | Admitting: *Deleted

## 2017-12-08 ENCOUNTER — Emergency Department (HOSPITAL_BASED_OUTPATIENT_CLINIC_OR_DEPARTMENT_OTHER)
Admission: EM | Admit: 2017-12-08 | Discharge: 2017-12-08 | Disposition: A | Discharge status: Home / Self Care | Payer: Medicare Other | Attending: Emergency Medicine | Admitting: Emergency Medicine

## 2017-12-08 DIAGNOSIS — R600 Localized edema: Secondary | ICD-10-CM | POA: Diagnosis present

## 2017-12-08 DIAGNOSIS — Z794 Long term (current) use of insulin: Secondary | ICD-10-CM | POA: Diagnosis not present

## 2017-12-08 DIAGNOSIS — I5032 Chronic diastolic (congestive) heart failure: Secondary | ICD-10-CM | POA: Insufficient documentation

## 2017-12-08 DIAGNOSIS — H00014 Hordeolum externum left upper eyelid: Secondary | ICD-10-CM | POA: Diagnosis not present

## 2017-12-08 DIAGNOSIS — I2510 Atherosclerotic heart disease of native coronary artery without angina pectoris: Secondary | ICD-10-CM | POA: Diagnosis not present

## 2017-12-08 DIAGNOSIS — E119 Type 2 diabetes mellitus without complications: Secondary | ICD-10-CM | POA: Diagnosis not present

## 2017-12-08 DIAGNOSIS — Z79899 Other long term (current) drug therapy: Secondary | ICD-10-CM | POA: Diagnosis not present

## 2017-12-08 DIAGNOSIS — Z7982 Long term (current) use of aspirin: Secondary | ICD-10-CM | POA: Insufficient documentation

## 2017-12-08 DIAGNOSIS — J449 Chronic obstructive pulmonary disease, unspecified: Secondary | ICD-10-CM | POA: Diagnosis not present

## 2017-12-08 DIAGNOSIS — I110 Hypertensive heart disease with heart failure: Secondary | ICD-10-CM | POA: Diagnosis not present

## 2017-12-08 MED ORDER — ERYTHROMYCIN 5 MG/GM OP OINT: TOPICAL_OINTMENT | OPHTHALMIC | 0 refills | Status: DC

## 2017-12-08 NOTE — ED Provider Notes (Signed)
[facility] EMERGENCY DEPARTMENT Provider Note   CSN: [id number] Arrival date & time: 12/08/17  1257     History   Chief Complaint Chief Complaint  Patient presents with  . Facial Swelling    HPI Destiny Ballard is a 67 y.o. female who presents for evaluation of left eyelid swelling that began yesterday.  Patient reports that she started experiencing left upper eyelid swelling that began yesterday but grossly worsened.  She reports some associated redness to the area.  Denies any vision changes.  Denies any trauma, injury to the eye.  She states she does not wear glasses or contacts.  Patient reports that she applied cold compresses and use over-the-counter dry eyedrops with minimal improvement.  Patient reports she does have an eye doctor that she sees and call their office but was unable to get an appointment, prompting ED visit.  Patient denies any fever, facial swelling.   The history is provided by the patient.    Past Medical History:  Diagnosis Date  . Anxiety    Prior suicide attempt  . CAD (coronary artery disease)    a) s/p DES to LAD 07/2005 b) Last Myoview low risk 11/2011 showing small fixed apical perfusion defect (prior MI vs attenuation) but no ischemia - normal EF.  [name] Cervical spondylosis   . Coronary atherosclerosis 06/28/2008  . Depression   . Depression with anxiety   . Diabetes mellitus without complication ([place])   . GERD (gastroesophageal reflux disease)   . Hyperlipidemia   . Hypertension   . Insulin resistance   . Iron deficiency anemia   . Obesity     Patient Active Problem List   Diagnosis Date Noted  . Lobar pneumonia, unspecified organism ([place]) 11/13/2017  . Abnormal cardiovascular stress test   . Precordial chest pain   . Sepsis ([place]) 09/18/2017  . Chronic diastolic CHF (congestive heart failure) ([place]) 09/18/2017  . Acute respiratory failure with hypoxia ([place]) 09/18/2017  . Respiratory failure with hypoxia ([place]) 09/18/2017  .  Lobar pneumonia ([place]) 09/17/2017  . Left hip pain 10/28/2016  . Chronic rhinitis 08/07/2016  . COPD with acute exacerbation ([place]) 04/25/2016  . Essential hypertension 01/30/2016  . High blood triglycerides   . Hypokalemia 01/27/2016  . Depression with anxiety   . CAD (coronary artery disease)   . HLD (hyperlipidemia)   . Type 2 diabetes mellitus without complication, without long-term current use of insulin ([place])   . OSA (obstructive sleep apnea) 05/06/2014  . Dyspnea on exertion 09/02/2012  . Chest pain 12/10/2011  . Chronic cough 05/11/2010  . Obesity (BMI 30-39.9)   . GERD 06/28/2008    Past Surgical History:  Procedure Laterality Date  . BREAST ENHANCEMENT SURGERY    . CARDIAC CATHETERIZATION  06/17/2007   NORMAL. EF 60%  . CARDIAC CATHETERIZATION N/A 01/29/2016   Procedure: Left Heart Cath and Coronary Angiography;  Surgeon: Sherren Mocha, MD;  Location: [facility];  Service: Cardiovascular;  Laterality: N/A;  . CERVICAL SPONDYLOSIS     SINGLE LEVEL FUSION  . CHILDBIRTH     X3  . CORONARY STENT PLACEMENT  07/2005   LEFT ANTERIOR DESCENDING  . FOREARM FRACTURE SURGERY  2010   hand and shoulder   . INCISION AND DRAINAGE BREAST ABSCESS  01/05/2012      . INCISION AND DRAINAGE PERIRECTAL ABSCESS N/A 02/18/2014   Procedure: IRRIGATION AND DEBRIDEMENT PERIRECTAL ABSCESS;  Surgeon: Pedro Earls, MD;  Location: [facility];  Service: General;  Laterality: N/A;  . LEFT HEART CATH AND CORONARY ANGIOGRAPHY N/A 10/10/2017   Procedure: LEFT HEART CATH AND CORONARY ANGIOGRAPHY;  Surgeon: Burnell Blanks, MD;  Location: [facility];  Service: Cardiovascular;  Laterality: N/A;  . LUMBAR LAMINECTOMY    . ROTATOR CUFF REPAIR     bil[place]la  . TONSILLECTOMY AND ADENOIDECTOMY    . TUBAL LIGATION    . VIDEO BRONCHOSCOPY Bilateral 08/13/2016   Procedure: VIDEO BRONCHOSCOPY WITHOUT FLUORO;  Surgeon: Collene Gobble, MD;  Location: [facility];  Service: Cardiopulmonary;   Laterality: Bilateral;     OB History   None      Home Medications    Prior to Admission medications   Medication Sig Start Date End Date Taking? Authorizing Provider  acetaminophen (TYLENOL) 650 MG CR tablet Take 1,300 mg by mouth every 8 (eight) hours as needed for pain.    [provider]  albuterol (PROAIR HFA) 108 (90 Base) MCG/ACT inhaler Inhale 2 puffs into the lungs every 4 (four) hours as needed for wheezing or shortness of breath. 09/15/17   Collene Gobble, MD  albuterol (PROVENTIL) (2.5 MG/3ML) 0.083% nebulizer solution Take 3 mLs (2.5 mg total) by nebulization every 4 (four) hours as needed for wheezing or shortness of breath. 11/13/17   Magdalen Spatz, NP  aspirin EC 81 MG tablet Take 81 mg by mouth daily.    [provider]  benzonatate (TESSALON) 100 MG capsule Take 1 capsule (100 mg total) by mouth 3 (three) times daily as needed for cough. 09/29/17   Copland, Gay Filler, MD  calcium carbonate (TUMS - DOSED IN MG ELEMENTAL CALCIUM) 500 MG chewable tablet Chew 2 tablets by mouth daily as needed for indigestion or heartburn.    [provider]  cetirizine (ZYRTEC) 10 MG tablet TAKE 10 MG BY MOUTH DAILY 10/16/16   [provider]  Cholecalciferol (VITAMIN D3) 1000 UNITS CAPS Take 1,000 Units by mouth daily.     [provider]  clonazePAM (KLONOPIN) 0.5 MG tablet TAKE 0.5 TABLETS (0.25 MG TOTAL) BY MOUTH 2 TIMES DAILY AS NEEDED FOR ANXIETY Patient taking differently: TAKE 0.5 MG BY MOUTH 2 TIMES DAILY AS NEEDED FOR ANXIETY 10/16/16   Copland, Gay Filler, MD  CRESTOR 20 MG tablet TAKE 1 TABLET BY MOUTH EVERY DAY Patient taking differently: TAKE 20 MG BY MOUTH EVERY DAY 11/16/14   Burtis Junes, NP  diphenhydramine-acetaminophen (TYLENOL PM) 25-500 MG TABS Take 2 tablets by mouth at bedtime as needed (for sleep).     [provider]  erythromycin ophthalmic ointment Place a 1/2 inch ribbon of ointment into the lower eyelid. 12/08/17    Volanda Napoleon, PA-C  FLUoxetine (PROZAC) 40 MG capsule Take 1 capsule (40 mg total) by mouth daily. 04/08/16   Copland, Gay Filler, MD  fluticasone (FLONASE) 50 MCG/ACT nasal spray Place 2 sprays into both nostrils daily. Patient taking differently: Place 2 sprays into both nostrils daily as needed for allergies.  09/15/17   Collene Gobble, MD  furosemide (LASIX) 20 MG tablet TAKE ONE TABLET DAILY IF NEEDED FOR LEG SWELLING Patient taking differently: TAKE 20 MG BY MOUTH DAILY 08/25/17   Copland, Gay Filler, MD  gabapentin (NEURONTIN) 300 MG capsule Take 600 mg by mouth 2 (two) times daily.     [provider]  hydrALAZINE (APRESOLINE) 25 MG tablet Take 2 tablets (50 mg total) by mouth 3 (three) times daily. 10/08/17   Burtis Junes, NP  hydrOXYzine (ATARAX/VISTARIL) 25 MG tablet TAKE 1 TABLET BY MOUTH EVERYDAY AT BEDTIME 12/03/17   Copland, Gay Filler, MD  ibuprofen (ADVIL,MOTRIN) 200 MG tablet Take 800 mg by mouth every 6 (six) hours as needed for headache or moderate pain.     [provider]  Insulin Detemir (LEVEMIR FLEXTOUCH) 100 UNIT/ML Pen Inject 10 Units into the skin daily. 01/30/16   Barton Dubois, MD  KLOR-CON 10 10 MEQ tablet TAKE 1 TABLET (10 MEQ TOTAL) BY MOUTH DAILY. TAKE ONLY ON DAYS THAT YOU USE LASIX/ FUROSEMIDE 11/03/17   Copland, Gay Filler, MD  losartan (COZAAR) 100 MG tablet Take 1 tablet (100 mg total) by mouth daily. 09/10/17 12/09/17  Burtis Junes, NP  lovastatin (MEVACOR) 20 MG tablet Take 1 tablet (20 mg total) by mouth at bedtime. 04/18/17   Copland, Gay Filler, MD  metFORMIN (GLUCOPHAGE-XR) 500 MG 24 hr tablet TAKE 2 TABLETS BY MOUTH TWICE A DAY 10/21/17   Renato Shin, MD  methocarbamol (ROBAXIN) 750 MG tablet TAKE 1 TABLET BY MOUTH EVERY 12 HOURS AS NEEDED FOR MUSCLE SPASMS Patient taking differently: Take 750 mg by mouth every 12 (twelve) hours as needed for muscle spasms.  09/01/17   Copland, Gay Filler, MD  metoprolol succinate (TOPROL-XL) 100 MG 24 hr  tablet Take 1 tablet (100 mg total) by mouth daily. 09/01/17   Copland, Gay Filler, MD  montelukast (SINGULAIR) 10 MG tablet Take 1 tablet (10 mg total) by mouth at bedtime. 11/13/17   Magdalen Spatz, NP  nitroGLYCERIN (NITROSTAT) 0.4 MG SL tablet Place 1 tablet (0.4 mg total) under the tongue every 5 (five) minutes as needed for chest pain. 09/01/17   Copland, Gay Filler, MD  omega-3 acid ethyl esters (LOVAZA) 1 g capsule Take 1 capsule (1 g total) by mouth 2 (two) times daily. 10/08/17   Burtis Junes, NP  Polyvinyl Alcohol-Povidone (REFRESH OP) Place 2 drops into both eyes daily as needed (for dry eyes).    [provider]  Tiotropium Bromide Monohydrate (SPIRIVA RESPIMAT) 2.5 MCG/ACT AERS Inhale 2 puffs into the lungs daily. 09/15/17   Collene Gobble, MD    Family History Family History  Problem Relation Age of Onset  . Heart attack Mother   . Diabetes Mother   . Lung cancer Mother   . Asthma Mother   . Heart disease Mother   . Suicidality Father        "killed himself"  . Asthma Daughter        x 2  . Cancer Daughter        pre-cancerous polyp  . Diabetes Sister   . Cancer Sister   . Cervical cancer Daughter        cervical   . Allergies Other        all family--seasonal allergies    Social History Social History   Tobacco Use  . Smoking status: Never Smoker  . Smokeless tobacco: Never Used  Substance Use Topics  . Alcohol use: Yes    Comment: occ  . Drug use: No     Allergies   Prednisone   Review of Systems Review of Systems  Constitutional: Negative for fever.  Eyes: Negative for redness and visual disturbance.       Eyelid swelling     Physical Exam Updated Vital Signs BP (!) 161/76   Pulse 99   Temp 98.4 F (36.9 C) (Oral)   Resp 18   Ht 5\' 3"  (1.6 m)  Wt 80.3 kg (177 lb)   SpO2 91%   BMI 31.35 kg/m   Physical Exam  Constitutional: She appears well-developed and well-nourished.  HENT:  Head: Normocephalic and atraumatic.  Eyes:  Pupils are equal, round, and reactive to light. Conjunctivae and EOM are normal. Right eye exhibits no discharge. Left eye exhibits hordeolum. Left eye exhibits no discharge. No scleral icterus.  Edema and erythema noted to the left upper eyelid with obvious hordeolum noted to the lateral eyelid.  No periorbital tenderness, erythema, edema.  EOMs intact without any difficulty.  No conjunctival injection bilaterally.  Pulmonary/Chest: Effort normal.  Neurological: She is alert.  Skin: Skin is warm and dry.  Psychiatric: She has a normal mood and affect. Her speech is normal and behavior is normal.  Nursing note and vitals reviewed.    ED Treatments / Results  Labs (all labs ordered are listed, but only abnormal results are displayed) Labs Reviewed - No data to display  EKG None  Radiology No results found.  Procedures Procedures (including critical care time)  Medications Ordered in ED Medications - No data to display   Initial Impression / Assessment and Plan / ED Course  I have reviewed the triage vital signs and the nursing notes.  Pertinent labs & imaging results that were available during my care of the patient were reviewed by me and considered in my medical decision making (see chart for details).     67 year old female who presents for evaluation of left upper eyelid swelling that began yesterday.  No fevers, preceding trauma, injury to the eye.  Does not wear glasses or contacts.  Called her eye doctor but they were closed today prompting ED visit. Patient is afebrile, non-toxic appearing, sitting comfortably on examination table. Vital signs reviewed. Patient is lightly tachycardic and hypertensive.  Will pre-plan to repeat vitals.  On exam, patient has edema and erythema noted to the left upper eyelid with obvious hordeolum.  EOMs intact without difficulty.  No periorbital tenderness, edema, erythema.  Physical exam consistent with stye of left upper eyelid.   History/physical exam is not concerning for preseptal or orbital cellulitis.  Instructed patient on supportive at home therapies.  We will plan to give her outpatient ophthalmology referral for her to follow-up with. Patient had ample opportunity for questions and discussion. All patient's questions were answered with full understanding. Strict return precautions discussed. Patient expresses understanding and agreement to plan.   Final Clinical Impressions(s) / ED Diagnoses   Final diagnoses:  Hordeolum externum of left upper eyelid    ED Discharge Orders        Ordered    erythromycin ophthalmic ointment     12/08/17 1433       Volanda Napoleon, PA-C 12/09/17 0127    Malvin Johns, MD 12/09/17 [phone]

## 2017-12-08 NOTE — Discharge Instructions (Signed)
You can take Tylenol or Ibuprofen as directed for pain. You can alternate Tylenol and Ibuprofen every 4 hours. If you take Tylenol at 1pm, then you can take Ibuprofen at 5pm. Then you can take Tylenol again at 9pm.   Take your blood pressure medication when you get home.  Apply the antibiotic ointment as directed.  Apply warm compresses to the eye to help to break up the stye.  Follow-up with referred ophthalmologist or your eye doctor in the next 24 to 48 hours for further evaluation.  Return the emergency department for any fevers, worsening redness or swelling of the face, fevers, eye pain or any other worsening or concerning symptoms.

## 2017-12-08 NOTE — ED Triage Notes (Signed)
Pt c/o left eye lid swelling x 2 days

## 2017-12-13 ENCOUNTER — Other Ambulatory Visit: Payer: Self-pay | Admitting: Family Medicine

## 2017-12-22 DIAGNOSIS — H53482 Generalized contraction of visual field, left eye: Secondary | ICD-10-CM | POA: Diagnosis not present

## 2017-12-22 DIAGNOSIS — Z09 Encounter for follow-up examination after completed treatment for conditions other than malignant neoplasm: Secondary | ICD-10-CM | POA: Diagnosis not present

## 2017-12-22 DIAGNOSIS — H02423 Myogenic ptosis of bilateral eyelids: Secondary | ICD-10-CM | POA: Diagnosis not present

## 2017-12-25 ENCOUNTER — Other Ambulatory Visit: Payer: Self-pay | Admitting: *Deleted

## 2017-12-25 ENCOUNTER — Encounter: Payer: Self-pay | Admitting: *Deleted

## 2017-12-25 NOTE — Patient Outreach (Signed)
[facility]) Care Management  12/25/2017  Destiny Ballard 1951/01/28 [id number]    Telephone Assessment (COPD)  RN spoke with pt today with an update on her ongoing management of care. Pt states she continue to do well however maybe getting a cold after entering a hot tube. States she does not take the portable O2 tank around due to the weight of the tank and is trying to arrange for an advertised tank she saw on TV that she really has an interest in obtaining. RN encouraged pt to use her O2 as prescribed to avoid acute issues with her breathing. States she is using her emergency inhaler about once daily alone with her nebulizer treatment. States she continues to have some thick secretions. RN discussed Mucinex as pt indicates she takes this medication and it works very well.   Pt states her CPAP was ordered and she is currently awaiting for the device to be set up today.  Discussed the current care plan and verified the pt is in the GREEN zone. Will discussed the adjustment based upon pt's progress and add adherence to her portable O2 to prevent acute symptoms from occurring.   Will initiate case to a health coach as pt receptive to ongoing telephone assessment concerning her COPD. Will order today and continue to encouraged pt to follow her management of care as discussed with the goals that will remain in place. No other needs or resources needed at this time.  Raina Mina, RN Care Management Coordinator [place] Office [phone]

## 2017-12-30 NOTE — Progress Notes (Deleted)
[facility] [address], [place], [place] [address] [phone]  Ballard:  12/31/2017   Name:  Destiny Ballard   DOB:  Oct 21, 1950   MRN:  [id number]  PCP:  Darreld Mclean, MD    Chief Complaint: No chief complaint on file.   History of Present Illness:  Destiny Ballard is a 67 y.o. very pleasant female patient who presents with the following:  Following up today History of COPD, HTN, dyslipidemia, DM, OSA, obesity I last saw her in March- at that time her husband had recently died, and she was then admitted with CAP and sepsis She had a cardiac cath in late march- from subsequent cardiology OV note: Assessment/Plan: 1.Chest pain and CAD: s/p of stent 2007.Recent cath from March with stable findings - needs aggressive CV risk factor modification - really encouraged her to start focusing on her well being/health. Regular exercise encouraged.  2.HTN:BP ok - do not see where we could cut any medicines back at this time but I explained to her that with successful CV risk factor modification this may be possible going forward.  3.GERD: -on chronic PPI therapy 4.DM-II:uncontrolled - managed by PCP 5.HLD - on multiple agents.  6.Chronic cough and chronic mild SOB: she is followed by pulmonary - seems better.  7. Situational stress/grief - she is not as tearful today but I still get the the impression that this is pretty hard for her  Lab Results  Component Value Ballard   HGBA1C 8.2 (H) 09/01/2017   Foot exam- do today Urine micro- do today Needs mammo   Patient Active Problem List   Diagnosis [place] Noted  . Lobar pneumonia, unspecified organism ([place]) 11/13/2017  . Abnormal cardiovascular stress test   . Precordial chest pain   . Sepsis ([place]) 09/18/2017  . Chronic diastolic CHF (congestive heart failure) ([place]) 09/18/2017  . Acute respiratory failure with hypoxia ([place]) 09/18/2017  . Left hip pain  10/28/2016  . Chronic rhinitis 08/07/2016  . COPD with acute exacerbation ([place]) 04/25/2016  . Essential hypertension 01/30/2016  . High blood triglycerides   . Hypokalemia 01/27/2016  . Depression with anxiety   . CAD (coronary artery disease)   . HLD (hyperlipidemia)   . Type 2 diabetes mellitus without complication, without long-term current use of insulin ([place])   . OSA (obstructive sleep apnea) 05/06/2014  . Dyspnea on exertion 09/02/2012  . Chest pain 12/10/2011  . Chronic cough 05/11/2010  . Obesity (BMI 30-39.9)   . GERD 06/28/2008    Past Medical History:  Diagnosis Ballard  . Anxiety    Prior suicide attempt  . CAD (coronary artery disease)    a) s/p DES to LAD 07/2005 b) Last Myoview low risk 11/2011 showing small fixed apical perfusion defect (prior MI vs attenuation) but no ischemia - normal EF.  [name] Cervical spondylosis   . Coronary atherosclerosis 06/28/2008  . Depression   . Depression with anxiety   . Diabetes mellitus without complication ([place])   . GERD (gastroesophageal reflux disease)   . Hyperlipidemia   . Hypertension   . Insulin resistance   . Iron deficiency anemia   . Obesity     Past Surgical History:  Procedure Laterality Ballard  . BREAST ENHANCEMENT SURGERY    . CARDIAC CATHETERIZATION  06/17/2007   NORMAL. EF 60%  . CARDIAC CATHETERIZATION N/A 01/29/2016   Procedure: Left Heart Cath and Coronary Angiography;  Surgeon: Legrand Como  Burt Knack, MD;  Location: [facility];  Service: Cardiovascular;  Laterality: N/A;  . CERVICAL SPONDYLOSIS     SINGLE LEVEL FUSION  . CHILDBIRTH     X3  . CORONARY STENT PLACEMENT  07/2005   LEFT ANTERIOR DESCENDING  . FOREARM FRACTURE SURGERY  2010   hand and shoulder   . INCISION AND DRAINAGE BREAST ABSCESS  01/05/2012      . INCISION AND DRAINAGE PERIRECTAL ABSCESS N/A 02/18/2014   Procedure: IRRIGATION AND DEBRIDEMENT PERIRECTAL ABSCESS;  Surgeon: Pedro Earls, MD;  Location: [facility];  Service: General;  Laterality:  N/A;  . LEFT HEART CATH AND CORONARY ANGIOGRAPHY N/A 10/10/2017   Procedure: LEFT HEART CATH AND CORONARY ANGIOGRAPHY;  Surgeon: Burnell Blanks, MD;  Location: [facility];  Service: Cardiovascular;  Laterality: N/A;  . LUMBAR LAMINECTOMY    . ROTATOR CUFF REPAIR     bilaterla  . TONSILLECTOMY AND ADENOIDECTOMY    . TUBAL LIGATION    . VIDEO BRONCHOSCOPY Bilateral 08/13/2016   Procedure: VIDEO BRONCHOSCOPY WITHOUT FLUORO;  Surgeon: Collene Gobble, MD;  Location: [facility];  Service: Cardiopulmonary;  Laterality: Bilateral;    Social History   Tobacco Use  . Smoking status: Never Smoker  . Smokeless tobacco: Never Used  Substance Use Topics  . Alcohol use: Yes    Comment: occ  . Drug use: No    Family History  Problem Relation Age of Onset  . Heart attack Mother   . Diabetes Mother   . Lung cancer Mother   . Asthma Mother   . Heart disease Mother   . Suicidality Father        "killed himself"  . Asthma Daughter        x 2  . Cancer Daughter        pre-cancerous polyp  . Diabetes Sister   . Cancer Sister   . Cervical cancer Daughter        cervical   . Allergies Other        all family--seasonal allergies    Allergies  Allergen Reactions  . Prednisone Other (See Comments)    REACTION: mood swings, nightmares. "Shot doesn't bother me, reaction is just with the pill" she states she has had the steroid injections before. From our records methylprednisone was given to her in 2013 without any complications.    Medication list has been reviewed and updated.  Current Outpatient Medications on File Prior to Visit  Medication Sig Dispense Refill  . acetaminophen (TYLENOL) 650 MG CR tablet Take 1,300 mg by mouth every 8 (eight) hours as needed for pain.    [name] albuterol (PROAIR HFA) 108 (90 Base) MCG/ACT inhaler Inhale 2 puffs into the lungs every 4 (four) hours as needed for wheezing or shortness of breath. 3 Inhaler 1  . albuterol (PROVENTIL) (2.5 MG/3ML)  0.083% nebulizer solution Take 3 mLs (2.5 mg total) by nebulization every 4 (four) hours as needed for wheezing or shortness of breath. 75 mL 12  . aspirin EC 81 MG tablet Take 81 mg by mouth daily.    . benzonatate (TESSALON) 100 MG capsule Take 1 capsule (100 mg total) by mouth 3 (three) times daily as needed for cough. 100 capsule 3  . calcium carbonate (TUMS - DOSED IN MG ELEMENTAL CALCIUM) 500 MG chewable tablet Chew 2 tablets by mouth daily as needed for indigestion or heartburn.    . cetirizine (ZYRTEC) 10 MG tablet TAKE 10 MG BY  MOUTH DAILY  3  . cetirizine (ZYRTEC) 10 MG tablet TAKE 1 TABLET BY MOUTH DAILY AS NEEDED FOR ALLERGIES 90 tablet 1  . Cholecalciferol (VITAMIN D3) 1000 UNITS CAPS Take 1,000 Units by mouth daily.     . clonazePAM (KLONOPIN) 0.5 MG tablet TAKE 0.5 TABLETS (0.25 MG TOTAL) BY MOUTH 2 TIMES DAILY AS NEEDED FOR ANXIETY (Patient taking differently: TAKE 0.5 MG BY MOUTH 2 TIMES DAILY AS NEEDED FOR ANXIETY) 20 tablet 1  . CRESTOR 20 MG tablet TAKE 1 TABLET BY MOUTH EVERY DAY (Patient taking differently: TAKE 20 MG BY MOUTH EVERY DAY) 90 tablet 3  . diphenhydramine-acetaminophen (TYLENOL PM) 25-500 MG TABS Take 2 tablets by mouth at bedtime as needed (for sleep).     [name] erythromycin ophthalmic ointment Place a 1/2 inch ribbon of ointment into the lower eyelid. 1 g 0  . FLUoxetine (PROZAC) 40 MG capsule Take 1 capsule (40 mg total) by mouth daily. 90 capsule 1  . fluticasone (FLONASE) 50 MCG/ACT nasal spray Place 2 sprays into both nostrils daily. (Patient taking differently: Place 2 sprays into both nostrils daily as needed for allergies. ) 48 g 1  . furosemide (LASIX) 20 MG tablet TAKE ONE TABLET DAILY IF NEEDED FOR LEG SWELLING (Patient taking differently: TAKE 20 MG BY MOUTH DAILY) 30 tablet 1  . gabapentin (NEURONTIN) 300 MG capsule Take 600 mg by mouth 2 (two) times daily.     . hydrALAZINE (APRESOLINE) 25 MG tablet Take 2 tablets (50 mg total) by mouth 3 (three) times  daily. 180 tablet 11  . hydrOXYzine (ATARAX/VISTARIL) 25 MG tablet TAKE 1 TABLET BY MOUTH EVERYDAY AT BEDTIME 90 tablet 1  . ibuprofen (ADVIL,MOTRIN) 200 MG tablet Take 800 mg by mouth every 6 (six) hours as needed for headache or moderate pain.     . Insulin Detemir (LEVEMIR FLEXTOUCH) 100 UNIT/ML Pen Inject 10 Units into the skin daily. 15 mL 3  . KLOR-CON 10 10 MEQ tablet TAKE 1 TABLET (10 MEQ TOTAL) BY MOUTH DAILY. TAKE ONLY ON DAYS THAT YOU USE LASIX/ FUROSEMIDE 30 tablet 3  . losartan (COZAAR) 100 MG tablet Take 1 tablet (100 mg total) by mouth daily. 90 tablet 3  . lovastatin (MEVACOR) 20 MG tablet Take 1 tablet (20 mg total) by mouth at bedtime. 90 tablet 3  . metFORMIN (GLUCOPHAGE-XR) 500 MG 24 hr tablet TAKE 2 TABLETS BY MOUTH TWICE A DAY 360 tablet 0  . methocarbamol (ROBAXIN) 750 MG tablet TAKE 1 TABLET BY MOUTH EVERY 12 HOURS AS NEEDED FOR MUSCLE SPASMS (Patient taking differently: Take 750 mg by mouth every 12 (twelve) hours as needed for muscle spasms. ) 60 tablet 0  . metoprolol succinate (TOPROL-XL) 100 MG 24 hr tablet Take 1 tablet (100 mg total) by mouth daily. 90 tablet 3  . montelukast (SINGULAIR) 10 MG tablet Take 1 tablet (10 mg total) by mouth at bedtime. 90 tablet 1  . nitroGLYCERIN (NITROSTAT) 0.4 MG SL tablet Place 1 tablet (0.4 mg total) under the tongue every 5 (five) minutes as needed for chest pain. 20 tablet 3  . omega-3 acid ethyl esters (LOVAZA) 1 g capsule Take 1 capsule (1 g total) by mouth 2 (two) times daily. 60 capsule 5  . Polyvinyl Alcohol-Povidone (REFRESH OP) Place 2 drops into both eyes daily as needed (for dry eyes).    . Tiotropium Bromide Monohydrate (SPIRIVA RESPIMAT) 2.5 MCG/ACT AERS Inhale 2 puffs into the lungs daily. 2 Inhaler 0   No  current facility-administered medications on file prior to visit.     Review of Systems:  As per HPI- otherwise negative.   Physical Examination: There were no vitals filed for this visit. There were no vitals  filed for this visit. There is no height or weight on file to calculate BMI. Ideal Body Weight:    GEN: WDWN, NAD, Non-toxic, A & O x 3 HEENT: Atraumatic, Normocephalic. Neck supple. No masses, No LAD. Ears and Nose: No external deformity. CV: RRR, No M/G/R. No JVD. No thrill. No extra heart sounds. PULM: CTA B, no wheezes, crackles, rhonchi. No retractions. No resp. distress. No accessory muscle use. ABD: S, NT, ND, +BS. No rebound. No HSM. EXTR: No c/c/e NEURO Normal gait.  PSYCH: Normally interactive. Conversant. Not depressed or anxious appearing.  Calm demeanor.    Assessment and Plan: ***  Signed Lamar Blinks, MD

## 2017-12-31 ENCOUNTER — Ambulatory Visit: Payer: Medicare Other | Admitting: Family Medicine

## 2018-01-03 NOTE — Progress Notes (Addendum)
[facility] [address], [place], [place] [address] [phone]  Date:  01/05/2018   Name:  Destiny Ballard   DOB:  October 14, 1950   MRN:  [id number]  PCP:  Darreld Mclean, MD    Chief Complaint: Diabetes (2/3 month follow up, a1c check) and Referral (wants ENT referral, ear pain, popping)   History of Present Illness:  Destiny Ballard is a 67 y.o. very pleasant female patient who presents with the following:  Following up today- I last saw her in March of this year.  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 Wilburn Cornelia, ENT, for her chronic ear popping and pain- will be glad to do for her Both ears are effected No drainage from her ears   Her home sugars are 140s No low glucose noted, no sx of hypoglycemia  Due for an A1c today  She uses CPAP for her nose only- I think a nasal pillow.  She feels like this might contribute to her ear sx, but she does still like it better than the full face mask  Needs urine creat today  She is out of her klonopin today as well and needs a refill  NCCSR:  12/03/2017  2  12/03/2017  Diazepam 5 Mg Tablet  2 2 Jo Man  [id number]  [place] ([id number])  0 0.50 LME Comm Ins  [place]  10/20/2017  2  10/20/2017  Hydrocodone-Acetamin 5-300 Mg  12 3 Re Zal  [id number]  Nor ([id number])  0 20.00 MME Comm Ins  [place]  10/17/2016  2  10/16/2016  Clonazepam 0.5 Mg Tablet  20 20 Je Cop  [id number]  [place] ([id number])  0 1.00 LME Comm Ins  [place]  07/21/2016  1  02/07/2016  Clonazepam 0.5 Mg Tablet  20 20 Je Cop  [id number]  [place] ([id number])        She also has noted right sided CP for about one month Not related to exertion, cannot determine any trigger or  pattern Lasts 2-3 minutes May occur a couple of times a week, last occurred about 5 days ago. No current CP  She had a cardiac cath and stress done earlier this year due to chest pain with intermediate stress result.  Her main cardiology provider is Truitt Merle, NP Cath report from 10/10/17:  Prox RCA lesion is 20% stenosed.  Dist RCA lesion is 20% stenosed.  Ost 2nd Mrg to 2nd Mrg lesion is 40% stenosed.  Prox Cx lesion is 20% stenosed.  Ost LAD to Prox LAD lesion is 10% stenosed.  Prox LAD to Dist LAD lesion is 20% stenosed.   1. Patent stent mid LAD with minimal restenosis.  2. Mild non-obstructive disease in the RCA, Circumflex and distal LAD Recommendations: Continue medical management of CAD.  Pt reports that this seems to be different than the CP she had earlier this year but can't really describe how it is different She does have COPD and states that "I stay SOB." however no change or worsening of these sx   She is using oxygen at home when she feels like she needs it, but not at night.  She does  not take it out of the house with her as it is too cumbersome  Patient Active Problem List   Diagnosis Date Noted  . Lobar pneumonia, unspecified organism ([place]) 11/13/2017  . Abnormal cardiovascular stress test   . Precordial chest pain   . Sepsis ([place]) 09/18/2017  . Chronic diastolic CHF (congestive heart failure) ([place]) 09/18/2017  . Acute respiratory failure with hypoxia ([place]) 09/18/2017  . Left hip pain 10/28/2016  . Chronic rhinitis 08/07/2016  . COPD with acute exacerbation ([place]) 04/25/2016  . Essential hypertension 01/30/2016  . High blood triglycerides   . Hypokalemia 01/27/2016  . Depression with anxiety   . CAD (coronary artery disease)   . HLD (hyperlipidemia)   . Type 2 diabetes mellitus without complication, without long-term current use of insulin ([place])   . OSA (obstructive sleep apnea) 05/06/2014  . Dyspnea on exertion 09/02/2012  . Chest pain 12/10/2011   . Chronic cough 05/11/2010  . Obesity (BMI 30-39.9)   . GERD 06/28/2008    Past Medical History:  Diagnosis Date  . Anxiety    Prior suicide attempt  . CAD (coronary artery disease)    a) s/p DES to LAD 07/2005 b) Last Myoview low risk 11/2011 showing small fixed apical perfusion defect (prior MI vs attenuation) but no ischemia - normal EF.  [name] Cervical spondylosis   . Coronary atherosclerosis 06/28/2008  . Depression   . Depression with anxiety   . Diabetes mellitus without complication ([place])   . GERD (gastroesophageal reflux disease)   . Hyperlipidemia   . Hypertension   . Insulin resistance   . Iron deficiency anemia   . Obesity     Past Surgical History:  Procedure Laterality Date  . BREAST ENHANCEMENT SURGERY    . CARDIAC CATHETERIZATION  06/17/2007   NORMAL. EF 60%  . CARDIAC CATHETERIZATION N/A 01/29/2016   Procedure: Left Heart Cath and Coronary Angiography;  Surgeon: Sherren Mocha, MD;  Location: [facility];  Service: Cardiovascular;  Laterality: N/A;  . CERVICAL SPONDYLOSIS     SINGLE LEVEL FUSION  . CHILDBIRTH     X3  . CORONARY STENT PLACEMENT  07/2005   LEFT ANTERIOR DESCENDING  . FOREARM FRACTURE SURGERY  2010   hand and shoulder   . INCISION AND DRAINAGE BREAST ABSCESS  01/05/2012      . INCISION AND DRAINAGE PERIRECTAL ABSCESS N/A 02/18/2014   Procedure: IRRIGATION AND DEBRIDEMENT PERIRECTAL ABSCESS;  Surgeon: Pedro Earls, MD;  [facility]: [facility];  Service: General;  Laterality: N/A;  . LEFT HEART CATH AND CORONARY ANGIOGRAPHY N/A 10/10/2017   Procedure: LEFT HEART CATH AND CORONARY ANGIOGRAPHY;  Surgeon: Burnell Blanks, MD;  Location: [facility];  Service: Cardiovascular;  Laterality: N/A;  . LUMBAR LAMINECTOMY    . ROTATOR CUFF REPAIR     bilaterla  . TONSILLECTOMY AND ADENOIDECTOMY    . TUBAL LIGATION    . VIDEO BRONCHOSCOPY Bilateral 08/13/2016   Procedure: VIDEO BRONCHOSCOPY WITHOUT FLUORO;  Surgeon: Collene Gobble, MD;   [facility];  Service: Cardiopulmonary;  Laterality: Bilateral;    Social History   Tobacco Use  . Smoking status: Never Smoker  . Smokeless tobacco: Never Used  Substance Use Topics  . Alcohol use: Yes    Comment: occ  . Drug use: No    Family History  Problem Relation Age of Onset  . Heart attack Mother   . Diabetes Mother   . Lung cancer Mother   .  Asthma Mother   . Heart disease Mother   . Suicidality Father        "killed himself"  . Asthma Daughter        x 2  . Cancer Daughter        pre-cancerous polyp  . Diabetes Sister   . Cancer Sister   . Cervical cancer Daughter        cervical   . Allergies Other        all family--seasonal allergies    Allergies  Allergen Reactions  . Prednisone Other (See Comments)    REACTION: mood swings, nightmares. "Shot doesn't bother me, reaction is just with the pill" she states she has had the steroid injections before. From our records methylprednisone was given to her in 2013 without any complications.    Medication list has been reviewed and updated.  Current Outpatient Medications on File Prior to Visit  Medication Sig Dispense Refill  . acetaminophen (TYLENOL) 650 MG CR tablet Take 1,300 mg by mouth every 8 (eight) hours as needed for pain.    [name] albuterol (PROAIR HFA) 108 (90 Base) MCG/ACT inhaler Inhale 2 puffs into the lungs every 4 (four) hours as needed for wheezing or shortness of breath. 3 Inhaler 1  . albuterol (PROVENTIL) (2.5 MG/3ML) 0.083% nebulizer solution Take 3 mLs (2.5 mg total) by nebulization every 4 (four) hours as needed for wheezing or shortness of breath. 75 mL 12  . aspirin EC 81 MG tablet Take 81 mg by mouth daily.    . calcium carbonate (TUMS - DOSED IN MG ELEMENTAL CALCIUM) 500 MG chewable tablet Chew 2 tablets by mouth daily as needed for indigestion or heartburn.    . cetirizine (ZYRTEC) 10 MG tablet TAKE 1 TABLET BY MOUTH DAILY AS NEEDED FOR ALLERGIES 90 tablet 1  . Cholecalciferol  (VITAMIN D3) 1000 UNITS CAPS Take 1,000 Units by mouth daily.     . CRESTOR 20 MG tablet TAKE 1 TABLET BY MOUTH EVERY DAY (Patient taking differently: TAKE 20 MG BY MOUTH EVERY DAY) 90 tablet 3  . diphenhydramine-acetaminophen (TYLENOL PM) 25-500 MG TABS Take 2 tablets by mouth at bedtime as needed (for sleep).     [name] erythromycin ophthalmic ointment Place a 1/2 inch ribbon of ointment into the lower eyelid. 1 g 0  . fluticasone (FLONASE) 50 MCG/ACT nasal spray Place 2 sprays into both nostrils daily. (Patient taking differently: Place 2 sprays into both nostrils daily as needed for allergies. ) 48 g 1  . furosemide (LASIX) 20 MG tablet TAKE ONE TABLET DAILY IF NEEDED FOR LEG SWELLING (Patient taking differently: TAKE 20 MG BY MOUTH DAILY) 30 tablet 1  . gabapentin (NEURONTIN) 300 MG capsule Take 600 mg by mouth 2 (two) times daily.     . hydrALAZINE (APRESOLINE) 25 MG tablet Take 2 tablets (50 mg total) by mouth 3 (three) times daily. 180 tablet 11  . hydrOXYzine (ATARAX/VISTARIL) 25 MG tablet TAKE 1 TABLET BY MOUTH EVERYDAY AT BEDTIME 90 tablet 1  . ibuprofen (ADVIL,MOTRIN) 200 MG tablet Take 800 mg by mouth every 6 (six) hours as needed for headache or moderate pain.     . Insulin Detemir (LEVEMIR FLEXTOUCH) 100 UNIT/ML Pen Inject 10 Units into the skin daily. 15 mL 3  . KLOR-CON 10 10 MEQ tablet TAKE 1 TABLET (10 MEQ TOTAL) BY MOUTH DAILY. TAKE ONLY ON DAYS THAT YOU USE LASIX/ FUROSEMIDE 30 tablet 3  . losartan (COZAAR) 100 MG tablet Take 1 tablet (  100 mg total) by mouth daily. 90 tablet 3  . lovastatin (MEVACOR) 20 MG tablet Take 1 tablet (20 mg total) by mouth at bedtime. 90 tablet 3  . metFORMIN (GLUCOPHAGE-XR) 500 MG 24 hr tablet TAKE 2 TABLETS BY MOUTH TWICE A DAY 360 tablet 0  . methocarbamol (ROBAXIN) 750 MG tablet TAKE 1 TABLET BY MOUTH EVERY 12 HOURS AS NEEDED FOR MUSCLE SPASMS (Patient taking differently: Take 750 mg by mouth every 12 (twelve) hours as needed for muscle spasms. ) 60  tablet 0  . metoprolol succinate (TOPROL-XL) 100 MG 24 hr tablet Take 1 tablet (100 mg total) by mouth daily. 90 tablet 3  . montelukast (SINGULAIR) 10 MG tablet Take 1 tablet (10 mg total) by mouth at bedtime. 90 tablet 1  . nitroGLYCERIN (NITROSTAT) 0.4 MG SL tablet Place 1 tablet (0.4 mg total) under the tongue every 5 (five) minutes as needed for chest pain. 20 tablet 3  . omega-3 acid ethyl esters (LOVAZA) 1 g capsule Take 1 capsule (1 g total) by mouth 2 (two) times daily. 60 capsule 5  . Polyvinyl Alcohol-Povidone (REFRESH OP) Place 2 drops into both eyes daily as needed (for dry eyes).    . Tiotropium Bromide Monohydrate (SPIRIVA RESPIMAT) 2.5 MCG/ACT AERS Inhale 2 puffs into the lungs daily. 2 Inhaler 0   No current facility-administered medications on file prior to visit.     Review of Systems:  As per HPI- otherwise negative.   Physical Examination: Vitals:   01/05/18 1326  BP: 118/60  Pulse: 90  Resp: 16  SpO2: 92%   Vitals:   01/05/18 1326  Weight: 178 lb (80.7 kg)  Height: 5\' 3"  (1.6 m)   Body mass index is 31.53 kg/m. Ideal Body Weight: Weight in (lb) to have BMI = 25: 140.8  GEN: WDWN, NAD, Non-toxic, A & O x 3, looks well, overweight,  She is in better spirits today and seems to be recovering following the death of her husband HEENT: Atraumatic, Normocephalic. Neck supple. No masses, No LAD.  Bilateral TM wnl, oropharynx normal.  PEERL,EOMI.   Ears and Nose: No external deformity. CV: RRR, No M/G/R. No JVD. No thrill. No extra heart sounds. PULM: CTA B, no wheezes, crackles, rhonchi. No retractions. No resp. distress. No accessory muscle use. ABD: S, NT, ND, +BS. No rebound. No HSM. EXTR: No c/c/e NEURO Normal gait.  PSYCH: Normally interactive. Conversant. Not depressed or anxious appearing.  Calm demeanor.   EKG: NSR  Compared to EKG from March of this year EKG appears improved/ more normal  Assessment and Plan: Chest pain, unspecified type - Plan: EKG  12-Lead  Depression - Plan: FLUoxetine (PROZAC) 40 MG capsule  Adjustment disorder with mixed anxiety and depressed mood - Plan: clonazePAM (KLONOPIN) 0.5 MG tablet  Discomfort of both ears - Plan: Ambulatory referral to ENT  Screening for breast cancer - Plan: MM 3D SCREEN BREAST BILATERAL  Uncontrolled type 2 diabetes mellitus with complication, with long-term current use of insulin ([place]) - Plan: Microalbumin / creatinine urine ratio, Hemoglobin A1c  Foot exam today, check A1c and urine protein as well Ordered mammo as she is well overdue Refilled her routine prozac and klonopin.  She is doing better as far as her mood and her acute grief is lessening Chest pain- recent exhaustive cardiac eval including cath was benign.  Suspect this is non- cardiac pain. No ative chest pain. Will touch base with Truitt Merle, her NP at cardiology.  Suggest that  she try treatment for GERD in the meantime Asked pt to alert me if any change or worsening of her sx  Signed Lamar Blinks, MD  Received her labs- will have to call pt about her DM  Results for orders placed or performed in visit on 01/05/18  Microalbumin / creatinine urine ratio  Result Value Ref Range   Microalb, Ur 7.8 (H) 0.0 - 1.9 mg/dL   Creatinine,U 104.5 mg/dL   Microalb Creat Ratio 7.5 0.0 - 30.0 mg/g  Hemoglobin A1c  Result Value Ref Range   Hgb A1c MFr Bld 9.6 (H) 4.6 - 6.5 %   Called pt to discuss labs 6/28-  She does have microalbuminuria but is already on an arb She is taking 10 u of levemir- will have her increase to 12 units.  May go up by 2 units every 4-5 days as long as her fasting sugar is over 150 until she reaches 16 units.  Will have her come and see me in 3 months for a recheck. She will let me know if any concerns   Also-  Received message from [place], [place] with cardiology- Given the recent cath findings, I think we can reassure her that this is not cardiac.   Thanks,  Cecille Rubin

## 2018-01-05 ENCOUNTER — Ambulatory Visit (INDEPENDENT_AMBULATORY_CARE_PROVIDER_SITE_OTHER): Payer: Medicare Other | Admitting: Family Medicine

## 2018-01-05 ENCOUNTER — Encounter: Payer: Self-pay | Admitting: Family Medicine

## 2018-01-05 ENCOUNTER — Encounter: Payer: Self-pay | Admitting: *Deleted

## 2018-01-05 VITALS — BP 118/60 | HR 90 | Resp 16 | Ht 63.0 in | Wt 178.0 lb

## 2018-01-05 DIAGNOSIS — F329 Major depressive disorder, single episode, unspecified: Secondary | ICD-10-CM

## 2018-01-05 DIAGNOSIS — E118 Type 2 diabetes mellitus with unspecified complications: Secondary | ICD-10-CM

## 2018-01-05 DIAGNOSIS — R079 Chest pain, unspecified: Secondary | ICD-10-CM | POA: Diagnosis not present

## 2018-01-05 DIAGNOSIS — E1165 Type 2 diabetes mellitus with hyperglycemia: Secondary | ICD-10-CM

## 2018-01-05 DIAGNOSIS — Z1239 Encounter for other screening for malignant neoplasm of breast: Secondary | ICD-10-CM

## 2018-01-05 DIAGNOSIS — IMO0002 Reserved for concepts with insufficient information to code with codable children: Secondary | ICD-10-CM

## 2018-01-05 DIAGNOSIS — F4323 Adjustment disorder with mixed anxiety and depressed mood: Secondary | ICD-10-CM

## 2018-01-05 DIAGNOSIS — Z1231 Encounter for screening mammogram for malignant neoplasm of breast: Secondary | ICD-10-CM | POA: Diagnosis not present

## 2018-01-05 DIAGNOSIS — I259 Chronic ischemic heart disease, unspecified: Secondary | ICD-10-CM | POA: Diagnosis not present

## 2018-01-05 DIAGNOSIS — Z794 Long term (current) use of insulin: Secondary | ICD-10-CM | POA: Diagnosis not present

## 2018-01-05 DIAGNOSIS — F32A Depression, unspecified: Secondary | ICD-10-CM

## 2018-01-05 DIAGNOSIS — H9203 Otalgia, bilateral: Secondary | ICD-10-CM

## 2018-01-05 LAB — MICROALBUMIN / CREATININE URINE RATIO
Creatinine,U: 104.5 mg/dL
Microalb Creat Ratio: 7.5 mg/g (ref 0.0–30.0)
Microalb, Ur: 7.8 mg/dL — ABNORMAL HIGH (ref 0.0–1.9)

## 2018-01-05 LAB — HEMOGLOBIN A1C: Hgb A1c MFr Bld: 9.6 % — ABNORMAL HIGH (ref 4.6–6.5)

## 2018-01-05 MED ORDER — CLONAZEPAM 0.5 MG PO TABS: ORAL_TABLET | ORAL | 1 refills | Status: DC

## 2018-01-05 MED ORDER — FLUOXETINE HCL 40 MG PO CAPS: 40.0000 mg | ORAL_CAPSULE | Freq: Every day | ORAL | 3 refills | Status: DC

## 2018-01-05 NOTE — Patient Instructions (Signed)
I will set you up for a mammogram to screen for breast cancer I will touch base with Cecille Rubin in Cardiology about your chest pain. However, as you recently had a cardiac cath I suspect that your pain is not cardiac. Please try taking OTC zantac for about 2 weeks and see if it helps you Continue to take your baby aspirin and let me know if any change in your chest symptoms   I will be in touch with your labs asap!

## 2018-01-26 DIAGNOSIS — H53482 Generalized contraction of visual field, left eye: Secondary | ICD-10-CM | POA: Diagnosis not present

## 2018-01-26 DIAGNOSIS — Z09 Encounter for follow-up examination after completed treatment for conditions other than malignant neoplasm: Secondary | ICD-10-CM | POA: Diagnosis not present

## 2018-01-26 DIAGNOSIS — H02422 Myogenic ptosis of left eyelid: Secondary | ICD-10-CM | POA: Diagnosis not present

## 2018-01-26 DIAGNOSIS — H02423 Myogenic ptosis of bilateral eyelids: Secondary | ICD-10-CM | POA: Diagnosis not present

## 2018-01-28 ENCOUNTER — Other Ambulatory Visit: Payer: Self-pay | Admitting: *Deleted

## 2018-01-28 NOTE — Patient Outreach (Signed)
[name]) Care Management  01/28/2018  Destiny Ballard 10/27/1950 [id number]   [place] Initial Assessment  Referral Date:  12/25/2017 Referral Source:  Transfer from [place] Reason for Referral:  Continued Disease Management Education Insurance:  Medicare   Outreach Attempt:  Outreach attempt #1 to patient for initial telephone assessment. No answer. RN Health Coach left HIPAA compliant voicemail message along with contact information.  Plan:  RN Health Coach will send unsuccessful outreach letter to patient.  RN Health Coach will make another outreach attempt to patient within 3-4 business days if no return call back from patient.   [place] [phone] [email or web address]

## 2018-02-02 ENCOUNTER — Other Ambulatory Visit: Payer: Self-pay | Admitting: *Deleted

## 2018-02-02 NOTE — Patient Outreach (Signed)
[facility]) Care Management  02/02/2018  Destiny Ballard 09/24/1950 [id number]   [place] Introduction Call  Referral Date:  12/25/2017 Referral Source:  Transfer from [place] Reason for Referral:  Continued Disease Management Education Insurance:  Medicare   Outreach Attempt:  Received return call from patient.  HIPAA verified with patient.   RN Health Coach introduced self and role.  Patient verbally agrees to monthly telephone outreaches.  Patient stating she is unable to complete initial telephone assessment at this time because she is out of town.  Requesting to call RN Health Coach back when able to complete.  Plan:  Will await return call back from patient to complete initial telephone assessment.  If no return call back, will outreach patient within the next 6 business days.  [place] [phone] [email or web address]

## 2018-02-03 ENCOUNTER — Encounter: Payer: Self-pay | Admitting: *Deleted

## 2018-02-03 ENCOUNTER — Ambulatory Visit (HOSPITAL_BASED_OUTPATIENT_CLINIC_OR_DEPARTMENT_OTHER): Payer: Medicare Other

## 2018-02-03 ENCOUNTER — Other Ambulatory Visit: Payer: Self-pay | Admitting: *Deleted

## 2018-02-03 NOTE — Patient Outreach (Signed)
[facility]) Care Management  [facility] Care Manager  02/03/2018   Destiny Ballard Apr 27, 1951 [id number]   RN Health Coach Initial Assessment   Referral Date:  12/25/2017 Referral Source:  Transfer from [place] Reason for Referral:  Continued Disease Management Education Insurance:  Medicare   Outreach Attempt:  Successful telephone outreach to patient for initial telephone assessment.  HIPAA verified with patient.  Patient completed initial telephone assessment.  Social:  Patient reports living at home with her daughter; patient lives in basement and daughter living upstairs.  Reports being independent with ADLs and IADLs.  Ambulates independently and denies any falls.  States she transports herself to her medical appointments.  Does report being depressed about the death of her husband in 18-Jul-2023 and began to cry when discussing this.  RN Health Coach offered support and discussed [place] Work referral for grief and depression.  Patient declines [facility] Social Work referral and states she "will be fine".  Does report a history of suicide, but denies any suicidal ideations at this time.  DME in the home include:  oxygen with concentrator, CBG meter, bedside commode, tub transfer bench, rolling walker, tall rolling walker, wheelchair, grab bar around toilet, nebulizer, and CPAP machine.  Conditions:   Per chart review and discussion with patient, PMH include but not limited to:  Congestive heart failure, COPD, hypertension, diabetes, home oxygen dependent, coronary artery disease, depression, anxiety, GERD, obstructive sleep apnea with CPAP, and hyperlipidemia.  Reports last hospitalization was for COPD exacerbation.  Patient stating she is unsure of when she got the diagnosis of COPD and is requesting information on the diagnosis.  Does report she has oxygen at home with a concentrator, but states she wears it as needed about every other day.  States she does not take  portable oxygen with her when she is away from home because the portable tank is too heavy.  Encouraged patient to wear her oxygen as prescribed and to discuss oxygen needs with pulmonologist.  Patient also reports she may have an air leak in her CPAP line; encouraged patient to contact DME Agency and request a visit for them to check her CPAP machine.  Denies any shortness of breath and reports using her rescue inhaler about once in the past few weeks.  Reports monitoring her blood sugars about every other day.  Fasting blood sugar yesterday was 282.  Last Hgb A1C was 9.6 on 01/05/2018 and patient reporting her insulin dose was increased at this time.  Patient encouraged to monitor blood sugars daily and record readings.  Discussed diagnosis of heart failure and knowledge of weighing herself daily.  Patient stating she has been told to weigh daily but needs to obtain a scale to do so.  Denies any lower extremity swelling at this time.  Medications:  Patient reports taking about 15 medications.  Manages her medications herself without difficulties with weekly pill box fills.  Denies any issues with affording her medications at this time.  Patient does verbalize she is unsure of why she is taking some medications.  Discussed [place] referral for medication education, patient declines at this time stating she will discuss her medications with her primary care provider at the next appointment.  Encounter Medications:  Outpatient Encounter Medications as of 02/03/2018  Medication Sig Note  . acetaminophen (TYLENOL) 650 MG CR tablet Take 1,300 mg by mouth every 8 (eight) hours as needed for pain.   [name] albuterol (PROAIR HFA) 108 (90 Base)  MCG/ACT inhaler Inhale 2 puffs into the lungs every 4 (four) hours as needed for wheezing or shortness of breath.   [name] albuterol (PROVENTIL) (2.5 MG/3ML) 0.083% nebulizer solution Take 3 mLs (2.5 mg total) by nebulization every 4 (four) hours as needed for wheezing or shortness  of breath.   [name] aspirin EC 81 MG tablet Take 81 mg by mouth daily.   . calcium carbonate (TUMS - DOSED IN MG ELEMENTAL CALCIUM) 500 MG chewable tablet Chew 2 tablets by mouth daily as needed for indigestion or heartburn.   . cetirizine (ZYRTEC) 10 MG tablet TAKE 1 TABLET BY MOUTH DAILY AS NEEDED FOR ALLERGIES   . Cholecalciferol (VITAMIN D3) 1000 UNITS CAPS Take 1,000 Units by mouth daily.    . clonazePAM (KLONOPIN) 0.5 MG tablet TAKE 0.5 TABLETS (0.25 MG TOTAL) BY MOUTH 2 TIMES DAILY AS NEEDED FOR ANXIETY   . CRESTOR 20 MG tablet TAKE 1 TABLET BY MOUTH EVERY DAY (Patient taking differently: TAKE 20 MG BY MOUTH EVERY DAY)   . diphenhydramine-acetaminophen (TYLENOL PM) 25-500 MG TABS Take 2 tablets by mouth at bedtime as needed (for sleep).    [name] erythromycin ophthalmic ointment Place a 1/2 inch ribbon of ointment into the lower eyelid.   [name] FLUoxetine (PROZAC) 40 MG capsule Take 1 capsule (40 mg total) by mouth daily.   . fluticasone (FLONASE) 50 MCG/ACT nasal spray Place 2 sprays into both nostrils daily. (Patient taking differently: Place 2 sprays into both nostrils daily as needed for allergies. )   . gabapentin (NEURONTIN) 300 MG capsule Take 600 mg by mouth 2 (two) times daily.    . hydrALAZINE (APRESOLINE) 25 MG tablet Take 2 tablets (50 mg total) by mouth 3 (three) times daily.   . hydrOXYzine (ATARAX/VISTARIL) 25 MG tablet TAKE 1 TABLET BY MOUTH EVERYDAY AT BEDTIME   . ibuprofen (ADVIL,MOTRIN) 200 MG tablet Take 800 mg by mouth every 6 (six) hours as needed for headache or moderate pain.    . Insulin Detemir (LEVEMIR FLEXTOUCH) 100 UNIT/ML Pen Inject 10 Units into the skin daily. 02/03/2018: Reports taking 18 units daily  . KLOR-CON 10 10 MEQ tablet TAKE 1 TABLET (10 MEQ TOTAL) BY MOUTH DAILY. TAKE ONLY ON DAYS THAT YOU USE LASIX/ FUROSEMIDE 02/03/2018: Takes with Lasix tablets  . lovastatin (MEVACOR) 20 MG tablet Take 1 tablet (20 mg total) by mouth at bedtime.   . metFORMIN (GLUCOPHAGE-XR) 500  MG 24 hr tablet TAKE 2 TABLETS BY MOUTH TWICE A DAY   . methocarbamol (ROBAXIN) 750 MG tablet TAKE 1 TABLET BY MOUTH EVERY 12 HOURS AS NEEDED FOR MUSCLE SPASMS (Patient taking differently: Take 750 mg by mouth every 12 (twelve) hours as needed for muscle spasms. )   . metoprolol succinate (TOPROL-XL) 100 MG 24 hr tablet Take 1 tablet (100 mg total) by mouth daily.   . montelukast (SINGULAIR) 10 MG tablet Take 1 tablet (10 mg total) by mouth at bedtime.   . nitroGLYCERIN (NITROSTAT) 0.4 MG SL tablet Place 1 tablet (0.4 mg total) under the tongue every 5 (five) minutes as needed for chest pain.   [name] omega-3 acid ethyl esters (LOVAZA) 1 g capsule Take 1 capsule (1 g total) by mouth 2 (two) times daily.   . Polyvinyl Alcohol-Povidone (REFRESH OP) Place 2 drops into both eyes daily as needed (for dry eyes).   . furosemide (LASIX) 20 MG tablet TAKE ONE TABLET DAILY IF NEEDED FOR LEG SWELLING (Patient not taking: Reported on 02/03/2018) 02/03/2018: Currently not  taking, no refills on the prescription  . losartan (COZAAR) 100 MG tablet Take 1 tablet (100 mg total) by mouth daily. 02/03/2018: Currently taking  . Tiotropium Bromide Monohydrate (SPIRIVA RESPIMAT) 2.5 MCG/ACT AERS Inhale 2 puffs into the lungs daily. (Patient not taking: Reported on 02/03/2018) 02/03/2018: Reports not taking   No facility-administered encounter medications on file as of 02/03/2018.     Functional Status:  In your present state of health, do you have any difficulty performing the following activities: 02/03/2018 10/24/2017  Hearing? N N  Vision? N N  Comment - -  Difficulty concentrating or making decisions? N N  Walking or climbing stairs? Y Y  Comment legs are weak -  Dressing or bathing? N N  Doing errands, shopping? N -  Preparing Food and eating ? N N  Using the Toilet? N N  In the past six months, have you accidently leaked urine? Y Y  Do you have problems with loss of bowel control? Y N  Managing your Medications? N N   Managing your Finances? N N  Housekeeping or managing your Housekeeping? N N  Some recent data might be hidden    Fall/Depression Screening: Fall Risk  02/03/2018 09/01/2017 09/01/2017  Falls in the past year? No No No  Risk for fall due to : Impaired balance/gait;Impaired mobility;Medication side effect - -   PHQ 2/9 Scores 02/03/2018 10/24/2017 09/01/2017 08/27/2016 09/20/2015  PHQ - 2 Score 2 6 6 3  0  PHQ- 9 Score 6 9 21 15  -  Exception Documentation - - - - Patient refusal   Appointments:  Attended primary care appointment on 6/24/219 with Dr. Lorelei Pont and needs to schedule follow up appointment for September 2019.  Patient last saw Pulmonologist on 11/13/2017 and needs to schedule follow up appointment for August 2019.  Encouraged patient to schedule both follow up appointments.  Has Cardiologist appointment on 05/19/2018.  Advanced Directives:  Reports having a Living Will in place and does not wish to make any changes at this time.   Consent:  [facility] services reviewed and discussed.  Patient verbally agrees to monthly telephone outreaches.  Plan: RN Health Coach will send primary MD barriers letter. RN Health Coach will route initial telephone assessment note to primary MD. [place] will send patient [place]. RN Health Coach will send patient 2019 Calendar Booklet. RN Health Coach will send patient Living Well with Diabetes Educational Packet. RN Health Coach will send patient COPD Educational Packet. RN Health Coach will make next monthly outreach to patient in the month of August.  Aminah Zabawa RN [place] [phone] Marshia [email or web address]

## 2018-02-04 ENCOUNTER — Encounter: Payer: Self-pay | Admitting: *Deleted

## 2018-02-11 ENCOUNTER — Telehealth: Payer: Self-pay | Admitting: Emergency Medicine

## 2018-02-11 DIAGNOSIS — R0602 Shortness of breath: Secondary | ICD-10-CM

## 2018-02-11 DIAGNOSIS — G4733 Obstructive sleep apnea (adult) (pediatric): Secondary | ICD-10-CM

## 2018-02-11 NOTE — Telephone Encounter (Signed)
Attempted to contact pt. Received a busy signal x2. Will try back. 

## 2018-02-12 NOTE — Telephone Encounter (Signed)
Pt is calling back [phone]

## 2018-02-12 NOTE — Telephone Encounter (Signed)
Spoke with pt. States that she wants her CPAP and oxygen therapy discontinued.  RB - please advise if you are okay with discontinuing this. Thanks.

## 2018-02-12 NOTE — Telephone Encounter (Signed)
Attempted to call patient today regarding discontinue order CPAP machine to p/u from [facility] I did not receive an answer at time of call. I have left a voicemail message for pt to return call. X2

## 2018-02-17 NOTE — Telephone Encounter (Signed)
Yes this is Ok 

## 2018-02-17 NOTE — Telephone Encounter (Signed)
Please advise Dr. Lamonte Sakai, thank you.

## 2018-02-17 NOTE — Telephone Encounter (Signed)
Left message for patient to call back to make her aware.

## 2018-02-18 NOTE — Telephone Encounter (Signed)
Patient is returning call. Cb is [phone]

## 2018-02-18 NOTE — Telephone Encounter (Signed)
Spoke with patient. She is aware of RB's recs. Will go ahead and place order for [facility] to discontinue her CPAP and oxygen.   Nothing further needed at time of call.

## 2018-02-18 NOTE — Telephone Encounter (Signed)
Attempted to call pt. I did not receive an answer. I have left a message for pt to return our call.  

## 2018-02-19 ENCOUNTER — Other Ambulatory Visit: Payer: Self-pay

## 2018-02-19 ENCOUNTER — Emergency Department (HOSPITAL_BASED_OUTPATIENT_CLINIC_OR_DEPARTMENT_OTHER): Payer: Medicare Other

## 2018-02-19 ENCOUNTER — Emergency Department (HOSPITAL_BASED_OUTPATIENT_CLINIC_OR_DEPARTMENT_OTHER)
Admission: EM | Admit: 2018-02-19 | Discharge: 2018-02-19 | Disposition: A | Discharge status: Home / Self Care | Payer: Medicare Other | Attending: Emergency Medicine | Admitting: Emergency Medicine

## 2018-02-19 ENCOUNTER — Encounter (HOSPITAL_BASED_OUTPATIENT_CLINIC_OR_DEPARTMENT_OTHER): Payer: Self-pay | Admitting: Emergency Medicine

## 2018-02-19 DIAGNOSIS — Z7982 Long term (current) use of aspirin: Secondary | ICD-10-CM | POA: Diagnosis not present

## 2018-02-19 DIAGNOSIS — Y998 Other external cause status: Secondary | ICD-10-CM | POA: Diagnosis not present

## 2018-02-19 DIAGNOSIS — I110 Hypertensive heart disease with heart failure: Secondary | ICD-10-CM | POA: Insufficient documentation

## 2018-02-19 DIAGNOSIS — J449 Chronic obstructive pulmonary disease, unspecified: Secondary | ICD-10-CM | POA: Insufficient documentation

## 2018-02-19 DIAGNOSIS — Z7984 Long term (current) use of oral hypoglycemic drugs: Secondary | ICD-10-CM | POA: Insufficient documentation

## 2018-02-19 DIAGNOSIS — Y929 Unspecified place or not applicable: Secondary | ICD-10-CM | POA: Insufficient documentation

## 2018-02-19 DIAGNOSIS — K5900 Constipation, unspecified: Secondary | ICD-10-CM | POA: Diagnosis not present

## 2018-02-19 DIAGNOSIS — E119 Type 2 diabetes mellitus without complications: Secondary | ICD-10-CM | POA: Diagnosis not present

## 2018-02-19 DIAGNOSIS — X58XXXA Exposure to other specified factors, initial encounter: Secondary | ICD-10-CM | POA: Diagnosis not present

## 2018-02-19 DIAGNOSIS — N2889 Other specified disorders of kidney and ureter: Secondary | ICD-10-CM | POA: Insufficient documentation

## 2018-02-19 DIAGNOSIS — Z955 Presence of coronary angioplasty implant and graft: Secondary | ICD-10-CM | POA: Diagnosis not present

## 2018-02-19 DIAGNOSIS — Y939 Activity, unspecified: Secondary | ICD-10-CM | POA: Diagnosis not present

## 2018-02-19 DIAGNOSIS — T148XXA Other injury of unspecified body region, initial encounter: Secondary | ICD-10-CM | POA: Diagnosis not present

## 2018-02-19 DIAGNOSIS — R0989 Other specified symptoms and signs involving the circulatory and respiratory systems: Secondary | ICD-10-CM | POA: Diagnosis not present

## 2018-02-19 DIAGNOSIS — I2510 Atherosclerotic heart disease of native coronary artery without angina pectoris: Secondary | ICD-10-CM | POA: Diagnosis not present

## 2018-02-19 DIAGNOSIS — N200 Calculus of kidney: Secondary | ICD-10-CM | POA: Diagnosis not present

## 2018-02-19 DIAGNOSIS — S39012A Strain of muscle, fascia and tendon of lower back, initial encounter: Secondary | ICD-10-CM | POA: Diagnosis not present

## 2018-02-19 DIAGNOSIS — I5032 Chronic diastolic (congestive) heart failure: Secondary | ICD-10-CM | POA: Insufficient documentation

## 2018-02-19 DIAGNOSIS — M545 Low back pain: Secondary | ICD-10-CM | POA: Diagnosis present

## 2018-02-19 DIAGNOSIS — R109 Unspecified abdominal pain: Secondary | ICD-10-CM | POA: Diagnosis not present

## 2018-02-19 LAB — URINALYSIS, MICROSCOPIC (REFLEX)

## 2018-02-19 LAB — URINALYSIS, ROUTINE W REFLEX MICROSCOPIC
Bilirubin Urine: NEGATIVE
Glucose, UA: NEGATIVE mg/dL
Hgb urine dipstick: NEGATIVE
Ketones, ur: NEGATIVE mg/dL
Nitrite: NEGATIVE
Protein, ur: NEGATIVE mg/dL
Specific Gravity, Urine: 1.02 (ref 1.005–1.030)
pH: 5.5 (ref 5.0–8.0)

## 2018-02-19 MED ORDER — METHOCARBAMOL 500 MG PO TABS: 500.0000 mg | ORAL_TABLET | Freq: Two times a day (BID) | ORAL | 0 refills | Status: DC

## 2018-02-19 MED ORDER — POLYETHYLENE GLYCOL 3350 17 G PO PACK: 17.0000 g | PACK | Freq: Every day | ORAL | 0 refills | Status: DC

## 2018-02-19 MED ORDER — ACETAMINOPHEN 325 MG PO TABS
650.0000 mg | ORAL_TABLET | Freq: Once | ORAL | Status: AC
  Administered 2018-02-19: 650 mg via ORAL
  Filled 2018-02-19: qty 2

## 2018-02-19 NOTE — ED Provider Notes (Signed)
[facility] EMERGENCY DEPARTMENT Provider Note   CSN: [id number] Arrival date & time: 02/19/18  1603     History   Chief Complaint Chief Complaint  Patient presents with  . Back Pain    HPI Destiny Ballard is a 67 y.o. female with a past medical history of CAD, diabetes, GERD, hypertension, who presents to ED for evaluation of right mid/lower back pain constant for the past 2 weeks.  She cannot recall any inciting event that may have triggered the symptoms.  She describes the pain as sharp and does not radiate.  She has a history of kidney stones but states that this feels different.  She has been taking ibuprofen with no improvement in her symptoms.  She has a history of "some disc problems" in her lower back but denies any prior back surgeries.  Denies any injuries or falls, numbness in legs.  Patient has been having some urinary incontinence for the past year but denies any changes from baseline.  Denies any dysuria, hematuria, fever, chest pain, shortness of breath, numbness in arms or legs.  HPI  Past Medical History:  Diagnosis Date  . Anxiety    Prior suicide attempt  . CAD (coronary artery disease)    a) s/p DES to LAD 07/2005 b) Last Myoview low risk 11/2011 showing small fixed apical perfusion defect (prior MI vs attenuation) but no ischemia - normal EF.  [name] Cervical spondylosis   . Coronary atherosclerosis 06/28/2008  . Depression   . Depression with anxiety   . Diabetes mellitus without complication ([place])   . GERD (gastroesophageal reflux disease)   . Hyperlipidemia   . Hypertension   . Insulin resistance   . Iron deficiency anemia   . Obesity     Patient Active Problem List   Diagnosis Date Noted  . Lobar pneumonia, unspecified organism ([place]) 11/13/2017  . Abnormal cardiovascular stress test   . Precordial chest pain   . Sepsis ([place]) 09/18/2017  . Chronic diastolic CHF (congestive heart failure) ([place]) 09/18/2017  . Acute respiratory failure with  hypoxia ([place]) 09/18/2017  . Left hip pain 10/28/2016  . Chronic rhinitis 08/07/2016  . COPD with acute exacerbation ([place]) 04/25/2016  . Essential hypertension 01/30/2016  . High blood triglycerides   . Hypokalemia 01/27/2016  . Depression with anxiety   . CAD (coronary artery disease)   . HLD (hyperlipidemia)   . Type 2 diabetes mellitus without complication, without long-term current use of insulin ([place])   . OSA (obstructive sleep apnea) 05/06/2014  . Dyspnea on exertion 09/02/2012  . Chest pain 12/10/2011  . Chronic cough 05/11/2010  . Obesity (BMI 30-39.9)   . GERD 06/28/2008    Past Surgical History:  Procedure Laterality Date  . BREAST ENHANCEMENT SURGERY    . CARDIAC CATHETERIZATION  06/17/2007   NORMAL. EF 60%  . CARDIAC CATHETERIZATION N/A 01/29/2016   Procedure: Left Heart Cath and Coronary Angiography;  Surgeon: Sherren Mocha, MD;  Location: [facility];  Service: Cardiovascular;  Laterality: N/A;  . CERVICAL SPONDYLOSIS     SINGLE LEVEL FUSION  . CHILDBIRTH     X3  . CORONARY STENT PLACEMENT  07/2005   LEFT ANTERIOR DESCENDING  . FOREARM FRACTURE SURGERY  2010   hand and shoulder   . INCISION AND DRAINAGE BREAST ABSCESS  01/05/2012      . INCISION AND DRAINAGE PERIRECTAL ABSCESS N/A 02/18/2014   Procedure: IRRIGATION AND DEBRIDEMENT PERIRECTAL ABSCESS;  Surgeon: Pedro Earls, MD;  Location: [facility];  Service: General;  Laterality: N/A;  . LEFT HEART CATH AND CORONARY ANGIOGRAPHY N/A 10/10/2017   Procedure: LEFT HEART CATH AND CORONARY ANGIOGRAPHY;  Surgeon: Burnell Blanks, MD;  Location: [facility];  Service: Cardiovascular;  Laterality: N/A;  . LUMBAR LAMINECTOMY    . ROTATOR CUFF REPAIR     bil[place]la  . TONSILLECTOMY AND ADENOIDECTOMY    . TUBAL LIGATION    . VIDEO BRONCHOSCOPY Bilateral 08/13/2016   Procedure: VIDEO BRONCHOSCOPY WITHOUT FLUORO;  Surgeon: Collene Gobble, MD;  Location: [facility];  Service: Cardiopulmonary;   Laterality: Bilateral;     OB History   None      Home Medications    Prior to Admission medications   Medication Sig Start Date End Date Taking? Authorizing Provider  acetaminophen (TYLENOL) 650 MG CR tablet Take 1,300 mg by mouth every 8 (eight) hours as needed for pain.    [provider]  albuterol (PROAIR HFA) 108 (90 Base) MCG/ACT inhaler Inhale 2 puffs into the lungs every 4 (four) hours as needed for wheezing or shortness of breath. 09/15/17   Collene Gobble, MD  albuterol (PROVENTIL) (2.5 MG/3ML) 0.083% nebulizer solution Take 3 mLs (2.5 mg total) by nebulization every 4 (four) hours as needed for wheezing or shortness of breath. 11/13/17   Magdalen Spatz, NP  aspirin EC 81 MG tablet Take 81 mg by mouth daily.    [provider]  calcium carbonate (TUMS - DOSED IN MG ELEMENTAL CALCIUM) 500 MG chewable tablet Chew 2 tablets by mouth daily as needed for indigestion or heartburn.    [provider]  cetirizine (ZYRTEC) 10 MG tablet TAKE 1 TABLET BY MOUTH DAILY AS NEEDED FOR ALLERGIES 12/16/17   Copland, Gay Filler, MD  Cholecalciferol (VITAMIN D3) 1000 UNITS CAPS Take 1,000 Units by mouth daily.     [provider]  clonazePAM (KLONOPIN) 0.5 MG tablet TAKE 0.5 TABLETS (0.25 MG TOTAL) BY MOUTH 2 TIMES DAILY AS NEEDED FOR ANXIETY 01/05/18   Copland, Gay Filler, MD  CRESTOR 20 MG tablet TAKE 1 TABLET BY MOUTH EVERY DAY Patient taking differently: TAKE 20 MG BY MOUTH EVERY DAY 11/16/14   Burtis Junes, NP  diphenhydramine-acetaminophen (TYLENOL PM) 25-500 MG TABS Take 2 tablets by mouth at bedtime as needed (for sleep).     [provider]  erythromycin ophthalmic ointment Place a 1/2 inch ribbon of ointment into the lower eyelid. 12/08/17   Volanda Napoleon, PA-C  FLUoxetine (PROZAC) 40 MG capsule Take 1 capsule (40 mg total) by mouth daily. 01/05/18   Copland, Gay Filler, MD  fluticasone (FLONASE) 50 MCG/ACT nasal spray Place 2 sprays into both  nostrils daily. Patient taking differently: Place 2 sprays into both nostrils daily as needed for allergies.  09/15/17   Collene Gobble, MD  furosemide (LASIX) 20 MG tablet TAKE ONE TABLET DAILY IF NEEDED FOR LEG SWELLING Patient not taking: Reported on 02/03/2018 08/25/17   Copland, Gay Filler, MD  gabapentin (NEURONTIN) 300 MG capsule Take 600 mg by mouth 2 (two) times daily.     [provider]  hydrALAZINE (APRESOLINE) 25 MG tablet Take 2 tablets (50 mg total) by mouth 3 (three) times daily. 10/08/17   Burtis Junes, NP  hydrOXYzine (ATARAX/VISTARIL) 25 MG tablet TAKE 1 TABLET BY MOUTH EVERYDAY AT BEDTIME 12/03/17   Copland, Gay Filler, MD  ibuprofen (ADVIL,MOTRIN) 200 MG tablet Take 800 mg by mouth every 6 (six)  hours as needed for headache or moderate pain.     [provider]  Insulin Detemir (LEVEMIR FLEXTOUCH) 100 UNIT/ML Pen Inject 10 Units into the skin daily. 01/30/16   Barton Dubois, MD  KLOR-CON 10 10 MEQ tablet TAKE 1 TABLET (10 MEQ TOTAL) BY MOUTH DAILY. TAKE ONLY ON DAYS THAT YOU USE LASIX/ FUROSEMIDE 11/03/17   Copland, Gay Filler, MD  losartan (COZAAR) 100 MG tablet Take 1 tablet (100 mg total) by mouth daily. 09/10/17 12/09/17  Burtis Junes, NP  lovastatin (MEVACOR) 20 MG tablet Take 1 tablet (20 mg total) by mouth at bedtime. 04/18/17   Copland, Gay Filler, MD  metFORMIN (GLUCOPHAGE-XR) 500 MG 24 hr tablet TAKE 2 TABLETS BY MOUTH TWICE A DAY 10/21/17   Renato Shin, MD  methocarbamol (ROBAXIN) 500 MG tablet Take 1 tablet (500 mg total) by mouth 2 (two) times daily. 02/19/18   Josy Peaden, PA-C  metoprolol succinate (TOPROL-XL) 100 MG 24 hr tablet Take 1 tablet (100 mg total) by mouth daily. 09/01/17   Copland, Gay Filler, MD  montelukast (SINGULAIR) 10 MG tablet Take 1 tablet (10 mg total) by mouth at bedtime. 11/13/17   Magdalen Spatz, NP  nitroGLYCERIN (NITROSTAT) 0.4 MG SL tablet Place 1 tablet (0.4 mg total) under the tongue every 5 (five) minutes as needed for chest  pain. 09/01/17   Copland, Gay Filler, MD  omega-3 acid ethyl esters (LOVAZA) 1 g capsule Take 1 capsule (1 g total) by mouth 2 (two) times daily. 10/08/17   Burtis Junes, NP  polyethylene glycol (MIRALAX / GLYCOLAX) packet Take 17 g by mouth daily. 02/19/18   Aliece Honold, PA-C  Polyvinyl Alcohol-Povidone (REFRESH OP) Place 2 drops into both eyes daily as needed (for dry eyes).    [provider]  Tiotropium Bromide Monohydrate (SPIRIVA RESPIMAT) 2.5 MCG/ACT AERS Inhale 2 puffs into the lungs daily. Patient not taking: Reported on 02/03/2018 09/15/17   Collene Gobble, MD    Family History Family History  Problem Relation Age of Onset  . Heart attack Mother   . Diabetes Mother   . Lung cancer Mother   . Asthma Mother   . Heart disease Mother   . Suicidality Father        "killed himself"  . Asthma Daughter        x 2  . Cancer Daughter        pre-cancerous polyp  . Diabetes Sister   . Cancer Sister   . Cervical cancer Daughter        cervical   . Allergies Other        all family--seasonal allergies    Social History Social History   Tobacco Use  . Smoking status: Never Smoker  . Smokeless tobacco: Never Used  Substance Use Topics  . Alcohol use: Yes    Comment: occ  . Drug use: No     Allergies   Prednisone   Review of Systems Review of Systems  Constitutional: Negative for appetite change, chills and fever.  HENT: Negative for ear pain, rhinorrhea, sneezing and sore throat.   Eyes: Negative for photophobia and visual disturbance.  Respiratory: Negative for cough, chest tightness, shortness of breath and wheezing.   Cardiovascular: Negative for chest pain and palpitations.  Gastrointestinal: Negative for abdominal pain, blood in stool, constipation, diarrhea, nausea and vomiting.  Genitourinary: Negative for dysuria, hematuria and urgency.  Musculoskeletal: Positive for back pain and myalgias.  Skin: Negative for rash.  Neurological: Negative for  dizziness, weakness and light-headedness.     Physical Exam Updated Vital Signs BP 126/61 (BP Location: Right Arm)   Pulse 89   Temp 98.5 F (36.9 C) (Oral)   Resp 18   Ht 5' (1.524 m)   Wt 81.6 kg   SpO2 96%   BMI 35.15 kg/m   Physical Exam  Constitutional: She appears well-developed and well-nourished. No distress.  Nontoxic-appearing and in no acute distress.  Ambulatory with normal gait.  HENT:  Head: Normocephalic and atraumatic.  Nose: Nose normal.  Eyes: Conjunctivae and EOM are normal. Left eye exhibits no discharge. No scleral icterus.  Neck: Normal range of motion. Neck supple.  Cardiovascular: Normal rate, regular rhythm, normal heart sounds and intact distal pulses. Exam reveals no gallop and no friction rub.  No murmur heard. Pulmonary/Chest: Effort normal and breath sounds normal. No respiratory distress.  Abdominal: Soft. Bowel sounds are normal. She exhibits no distension. There is no tenderness. There is no guarding.  Musculoskeletal: Normal range of motion. She exhibits tenderness. She exhibits no edema.       Back:  No midline spinal tenderness present in lumbar, thoracic or cervical spine. No step-off palpated. No visible bruising, edema or temperature change noted. No objective signs of numbness present. No saddle anesthesia. 2+ DP pulses bilaterally. Sensation intact to light touch. Strength 5/5 in bilateral lower extremities.  Neurological: She is alert. She exhibits normal muscle tone. Coordination normal.  Skin: Skin is warm and dry. No rash noted.  Psychiatric: She has a normal mood and affect.  Nursing note and vitals reviewed.    ED Treatments / Results  Labs (all labs ordered are listed, but only abnormal results are displayed) Labs Reviewed  URINALYSIS, ROUTINE W REFLEX MICROSCOPIC - Abnormal; Notable for the following components:      Result Value   Leukocytes, UA SMALL (*)    All other components within normal limits  URINALYSIS,  MICROSCOPIC (REFLEX) - Abnormal; Notable for the following components:   Bacteria, UA RARE (*)    All other components within normal limits  URINE CULTURE    EKG None  Radiology Dg Chest 2 View  Result Date: 02/19/2018 CLINICAL DATA:  RIGHT flank pain radiating up to RIGHT side of back for 2 weeks. EXAM: CHEST - 2 VIEW COMPARISON:  Chest x-rays dated 09/29/2017 and 01/26/2016. FINDINGS: Heart size is upper normal. Overall cardiomediastinal silhouette is stable in size and configuration. Lungs are clear. No pleural effusion or pneumothorax seen. No acute or suspicious osseous finding. IMPRESSION: No acute findings.  No evidence of pneumonia or pulmonary edema. Electronically Signed   By: Franki Cabot M.D.   On: 02/19/2018 19:11   Ct Renal Stone Study  Result Date: 02/19/2018 CLINICAL DATA:  RIGHT flank pain for 2 weeks. History of diabetes and hypertension. EXAM: CT ABDOMEN AND PELVIS WITHOUT CONTRAST TECHNIQUE: Multidetector CT imaging of the abdomen and pelvis was performed following the standard protocol without IV contrast. COMPARISON:  05/10/2011 FINDINGS: Lower chest: The lung bases are unremarkable. There are calcified breast implants bilaterally. Heart size is normal. Hepatobiliary: No focal liver abnormality is seen. No radiopaque gallstones, biliary dilatation, or pericholecystic inflammatory changes. Pancreas: Unremarkable. No pancreatic ductal dilatation or surrounding inflammatory changes. Spleen: Normal in size without focal abnormality. Adrenals/Urinary Tract: Normal adrenal glands. Within the UPPER pole the RIGHT kidney, there is a solid-appearing mass measuring 3.4 x 3.0 centimeters. This indeterminate for cyst by Hounsfield unit measurements. A 2.5 centimeter cyst  is identified in the LOWER pole the RIGHT kidney Largest intrarenal calculus in the LEFT kidney is 8 millimeters. A smaller stone is 7 millimeters in the LOWER pole region. A small LEFT renal cyst is 1.5 centimeters in the  midpole region. Ureters are normal in appearance. Bladder is unremarkable. Stomach/Bowel: The stomach and is normal in appearance. Small bowel loops are normal in caliber but distal small bowel loops contain a significant fecal burden. Loops of colon are filled with stool but otherwise unremarkable in appearance. Appendix is not seen. Vascular/Lymphatic: There is significant atherosclerosis of the abdominal, not associated with aneurysm. Reproductive: The uterus is present.  There is no adnexal mass. Other: Small bilateral fat containing inguinal hernias. Musculoskeletal: Schmorl's nodes at multiple levels. Disc height loss at L2-3. No acute fracture. IMPRESSION: 1. Suspect solid lesion involving the UPPER pole the RIGHT kidney, not further characterized. Mass measures approximately 3.4 centimeters and warrants further characterization with renal protocol MRI or CT. 2. Bilateral renal cysts. 3. Significant stool burden involving the entire colon and distal small bowel loops. 4. Nonobstructing LEFT intrarenal calculi. No urinary tract obstruction. Electronically Signed   By: Nolon Nations M.D.   On: 02/19/2018 18:45    Procedures Procedures (including critical care time)  Medications Ordered in ED Medications  acetaminophen (TYLENOL) tablet 650 mg (650 mg Oral Given 02/19/18 1841)     Initial Impression / Assessment and Plan / ED Course  I have reviewed the triage vital signs and the nursing notes.  Pertinent labs & imaging results that were available during my care of the patient were reviewed by me and considered in my medical decision making (see chart for details).     67 year old female with a past medical history of CAD, diabetes, hypertension presents to ED for evaluation of right mid/lower back pain that has been constant for the past 2 weeks.  States that it feels different than her usual kidney stones.  No improvement with ibuprofen.  Does have a history of "disc problems" but denies any  prior back surgeries.  Denies any numbness in legs.  Patient is amatory with normal gait.  No deficits on neurological exam noted.  There is tenderness palpation of the right mid to lower back with no midline tenderness noted.  Any symptoms.  Urinalysis shows small leukocytes and rare bacteria as well as few WBC.  CT renal stone study shows solid mass of the right upper kidney, large stool burden, several renal cysts.  Chest x-ray is unremarkable.  I ordered an outpatient MRI for the patient to further characterize the renal mass. Suspect that symptoms are musculoskeletal in nature.  Will give muscle relaxer, advised lidocaine patches and PCP follow-up.  Advised to return to ED for any severe worsening symptoms. Patient discussed with and seen by my attending, Dr. Gilford Raid.  Portions of this note were generated with [employer]. Dictation errors may occur despite best attempts at proofreading.   Final Clinical Impressions(s) / ED Diagnoses   Final diagnoses:  Renal mass, right  Muscle strain  Constipation, unspecified constipation type    ED Discharge Orders         Ordered    MR ABDOMEN W CONTRAST     02/19/18 1902    polyethylene glycol (MIRALAX) packet  Daily,   Status:  Discontinued     02/19/18 1918    methocarbamol (ROBAXIN) 500 MG tablet  2 times daily,   Status:  Discontinued     02/19/18 1922  polyethylene glycol (MIRALAX / GLYCOLAX) packet  Daily     02/19/18 1923    methocarbamol (ROBAXIN) 500 MG tablet  2 times daily     02/19/18 1923           Delia Heady, PA-C 02/19/18 1923    Isla Pence, MD 02/19/18 2001

## 2018-02-19 NOTE — ED Triage Notes (Signed)
R lower back pain x 2 weeks. Denies injury.

## 2018-02-19 NOTE — Discharge Instructions (Addendum)
You will need to return to complete an MRI of your abdomen to further evaluate the lesion found in your right kidney. Use Salonpas patches in the area to help with discomfort.

## 2018-02-21 LAB — URINE CULTURE

## 2018-02-24 NOTE — Progress Notes (Addendum)
[place] [facility] [address], [place], [place] [address] [phone]  Date:  02/25/2018   Name:  Destiny Ballard   DOB:  11-22-1950   MRN:  [id number]  PCP:  Darreld Mclean, MD    Chief Complaint: Back Pain (right side, 2 months, seen urgent care-xray and ct scan-normal, suggets mri)   History of Present Illness:  Destiny Ballard is a 67 y.o. very pleasant female patient who presents with the following: Pt with history of diabetes, COPD, depression, HTN, recnet loss of a loved one (her husband Betsey Amen)  I last saw her in June: Foot exam today, check A1c and urine protein as well Ordered mammo as she is well overdue Refilled her routine prozac and klonopin.  She is doing better as far as her mood and her acute grief is lessening Chest pain- recent exhaustive cardiac eval including cath was benign.  Suspect this is non- cardiac pain. No ative chest pain. Will touch base with Truitt Merle, her NP at cardiology.  Suggest that she try treatment for GERD in the meantime  Here today with concern about her back-  She was in the ER on 8/8 as follows: Destiny Ballard is a 67 y.o. female with a past medical history of CAD, diabetes, GERD, hypertension, who presents to ED for evaluation of right mid/lower back pain constant for the past 2 weeks.  She cannot recall any inciting event that may have triggered the symptoms.  She describes the pain as sharp and does not radiate.  She has a history of kidney stones but states that this feels different.  She has been taking ibuprofen with no improvement in her symptoms.  She has a history of "some disc problems" in her lower back but denies any prior back surgeries.  Denies any injuries or falls, numbness in legs.  Patient has been having some urinary incontinence for the past year but denies any changes from baseline.  Denies any dysuria, hematuria, fever, chest pain, shortness of breath,  numbness in arms or legs//////////////////////////////////////////////////// 67 year old female with a past medical history of CAD, diabetes, hypertension presents to ED for evaluation of right mid/lower back pain that has been constant for the past 2 weeks.  States that it feels different than her usual kidney stones.  No improvement with ibuprofen.  Does have a history of "disc problems" but denies any prior back surgeries.  Denies any numbness in legs.  Patient is amatory with normal gait.  No deficits on neurological exam noted.  There is tenderness palpation of the right mid to lower back with no midline tenderness noted.  Any symptoms.  Urinalysis shows small leukocytes and rare bacteria as well as few WBC.  CT renal stone study shows solid mass of the right upper kidney, large stool burden, several renal cysts.  Chest x-ray is unremarkable.  I ordered an outpatient MRI for the patient to further characterize the renal mass. Suspect that symptoms are musculoskeletal in nature.  Will give muscle relaxer, advised lidocaine patches and PCP follow-up.  Advised to return to ED for any severe worsening symptoms.   She still does have some right sided back pain in the thoracolumbar region, seems to be MSK to her  No rash No fever It does seem to be getting better  However she does have a cough that is not productive and is persistent.  The cough is really bothering her more now  than anything  She is no longer using her oxygen so this will be picked up from her house- she does not need it any longer  She is not using CPAP- tried to use the nasal canula but it will fall off her face at night and wake her up   Her BIL is in the hospital with pneumonia- she is feeling stressed out  For her COPD she is using nebs prn and an inhaler- albuterol only She is out of her spiriva per her report - will refil for her today   We discussed her renal mass- will schedule MRI for her. Will get labs for her today so  she can get contrast   Lab Results  Component Value Date   HGBA1C 9.6 (H) 01/05/2018     Dg Chest 2 View  Result Date: 02/19/2018 CLINICAL DATA:  RIGHT flank pain radiating up to RIGHT side of back for 2 weeks. EXAM: CHEST - 2 VIEW COMPARISON:  Chest x-rays dated 09/29/2017 and 01/26/2016. FINDINGS: Heart size is upper normal. Overall cardiomediastinal silhouette is stable in size and configuration. Lungs are clear. No pleural effusion or pneumothorax seen. No acute or suspicious osseous finding. IMPRESSION: No acute findings.  No evidence of pneumonia or pulmonary edema. Electronically Signed   By: Franki Cabot M.D.   On: 02/19/2018 19:11   Ct Renal Stone Study  Result Date: 02/19/2018 CLINICAL DATA:  RIGHT flank pain for 2 weeks. History of diabetes and hypertension. EXAM: CT ABDOMEN AND PELVIS WITHOUT CONTRAST TECHNIQUE: Multidetector CT imaging of the abdomen and pelvis was performed following the standard protocol without IV contrast. COMPARISON:  05/10/2011 FINDINGS: Lower chest: The lung bases are unremarkable. There are calcified breast implants bilaterally. Heart size is normal. Hepatobiliary: No focal liver abnormality is seen. No radiopaque gallstones, biliary dilatation, or pericholecystic inflammatory changes. Pancreas: Unremarkable. No pancreatic ductal dilatation or surrounding inflammatory changes. Spleen: Normal in size without focal abnormality. Adrenals/Urinary Tract: Normal adrenal glands. Within the UPPER pole the RIGHT kidney, there is a solid-appearing mass measuring 3.4 x 3.0 centimeters. This indeterminate for cyst by Hounsfield unit measurements. A 2.5 centimeter cyst is identified in the LOWER pole the RIGHT kidney Largest intrarenal calculus in the LEFT kidney is 8 millimeters. A smaller stone is 7 millimeters in the LOWER pole region. A small LEFT renal cyst is 1.5 centimeters in the midpole region. Ureters are normal in appearance. Bladder is unremarkable. Stomach/Bowel:  The stomach and is normal in appearance. Small bowel loops are normal in caliber but distal small bowel loops contain a significant fecal burden. Loops of colon are filled with stool but otherwise unremarkable in appearance. Appendix is not seen. Vascular/Lymphatic: There is significant atherosclerosis of the abdominal, not associated with aneurysm. Reproductive: The uterus is present.  There is no adnexal mass. Other: Small bilateral fat containing inguinal hernias. Musculoskeletal: Schmorl's nodes at multiple levels. Disc height loss at L2-3. No acute fracture. IMPRESSION: 1. Suspect solid lesion involving the UPPER pole the RIGHT kidney, not further characterized. Mass measures approximately 3.4 centimeters and warrants further characterization with renal protocol MRI or CT. 2. Bilateral renal cysts. 3. Significant stool burden involving the entire colon and distal small bowel loops. 4. Nonobstructing LEFT intrarenal calculi. No urinary tract obstruction. Electronically Signed   By: Nolon Nations M.D.   On: 02/19/2018 18:45   Klonopin crestor Lasix Hydroxyzine Metformin lovaza singulair levemir Losartan toprol xl spiriva Albuterol Robaxin gabapentin   Patient Active Problem List   Diagnosis Date  Noted  . Lobar pneumonia, unspecified organism ([place]) 11/13/2017  . Abnormal cardiovascular stress test   . Precordial chest pain   . Sepsis ([place]) 09/18/2017  . Chronic diastolic CHF (congestive heart failure) ([place]) 09/18/2017  . Acute respiratory failure with hypoxia ([place]) 09/18/2017  . Left hip pain 10/28/2016  . Chronic rhinitis 08/07/2016  . COPD with acute exacerbation ([place]) 04/25/2016  . Essential hypertension 01/30/2016  . High blood triglycerides   . Hypokalemia 01/27/2016  . Depression with anxiety   . CAD (coronary artery disease)   . HLD (hyperlipidemia)   . Type 2 diabetes mellitus without complication, without long-term current use of insulin ([place])   . OSA (obstructive  sleep apnea) 05/06/2014  . Dyspnea on exertion 09/02/2012  . Chest pain 12/10/2011  . Chronic cough 05/11/2010  . Obesity (BMI 30-39.9)   . GERD 06/28/2008    Past Medical History:  Diagnosis Date  . Anxiety    Prior suicide attempt  . CAD (coronary artery disease)    a) s/p DES to LAD 07/2005 b) Last Myoview low risk 11/2011 showing small fixed apical perfusion defect (prior MI vs attenuation) but no ischemia - normal EF.  [name] Cervical spondylosis   . Coronary atherosclerosis 06/28/2008  . Depression   . Depression with anxiety   . Diabetes mellitus without complication ([place])   . GERD (gastroesophageal reflux disease)   . Hyperlipidemia   . Hypertension   . Insulin resistance   . Iron deficiency anemia   . Obesity     Past Surgical History:  Procedure Laterality Date  . BREAST ENHANCEMENT SURGERY    . CARDIAC CATHETERIZATION  06/17/2007   NORMAL. EF 60%  . CARDIAC CATHETERIZATION N/A 01/29/2016   Procedure: Left Heart Cath and Coronary Angiography;  Surgeon: Sherren Mocha, MD;  Location: [facility];  Service: Cardiovascular;  Laterality: N/A;  . CERVICAL SPONDYLOSIS     SINGLE LEVEL FUSION  . CHILDBIRTH     X3  . CORONARY STENT PLACEMENT  07/2005   LEFT ANTERIOR DESCENDING  . FOREARM FRACTURE SURGERY  2010   hand and shoulder   . INCISION AND DRAINAGE BREAST ABSCESS  01/05/2012      . INCISION AND DRAINAGE PERIRECTAL ABSCESS N/A 02/18/2014   Procedure: IRRIGATION AND DEBRIDEMENT PERIRECTAL ABSCESS;  Surgeon: Pedro Earls, MD;  Location: [facility];  Service: General;  Laterality: N/A;  . LEFT HEART CATH AND CORONARY ANGIOGRAPHY N/A 10/10/2017   Procedure: LEFT HEART CATH AND CORONARY ANGIOGRAPHY;  Surgeon: Burnell Blanks, MD;  Location: [facility];  Service: Cardiovascular;  Laterality: N/A;  . LUMBAR LAMINECTOMY    . ROTATOR CUFF REPAIR     bilaterla  . TONSILLECTOMY AND ADENOIDECTOMY    . TUBAL LIGATION    . VIDEO BRONCHOSCOPY Bilateral  08/13/2016   Procedure: VIDEO BRONCHOSCOPY WITHOUT FLUORO;  Surgeon: Collene Gobble, MD;  Location: [facility];  Service: Cardiopulmonary;  Laterality: Bilateral;    Social History   Tobacco Use  . Smoking status: Never Smoker  . Smokeless tobacco: Never Used  Substance Use Topics  . Alcohol use: Yes    Comment: occ  . Drug use: No    Family History  Problem Relation Age of Onset  . Heart attack Mother   . Diabetes Mother   . Lung cancer Mother   . Asthma Mother   . Heart disease Mother   . Suicidality Father        "killed himself"  .  Asthma Daughter        x 2  . Cancer Daughter        pre-cancerous polyp  . Diabetes Sister   . Cancer Sister   . Cervical cancer Daughter        cervical   . Allergies Other        all family--seasonal allergies    Allergies  Allergen Reactions  . Prednisone Other (See Comments)    REACTION: mood swings, nightmares. "Shot doesn't bother me, reaction is just with the pill" she states she has had the steroid injections before. From our records methylprednisone was given to her in 2013 without any complications.    Medication list has been reviewed and updated.  Current Outpatient Medications on File Prior to Visit  Medication Sig Dispense Refill  . albuterol (PROAIR HFA) 108 (90 Base) MCG/ACT inhaler Inhale 2 puffs into the lungs every 4 (four) hours as needed for wheezing or shortness of breath. 3 Inhaler 1  . albuterol (PROVENTIL) (2.5 MG/3ML) 0.083% nebulizer solution Take 3 mLs (2.5 mg total) by nebulization every 4 (four) hours as needed for wheezing or shortness of breath. 75 mL 12  . aspirin EC 81 MG tablet Take 81 mg by mouth daily.    . calcium carbonate (TUMS - DOSED IN MG ELEMENTAL CALCIUM) 500 MG chewable tablet Chew 2 tablets by mouth daily as needed for indigestion or heartburn.    . cetirizine (ZYRTEC) 10 MG tablet TAKE 1 TABLET BY MOUTH DAILY AS NEEDED FOR ALLERGIES 90 tablet 1  . Cholecalciferol (VITAMIN D3) 1000  UNITS CAPS Take 1,000 Units by mouth daily.     . clonazePAM (KLONOPIN) 0.5 MG tablet TAKE 0.5 TABLETS (0.25 MG TOTAL) BY MOUTH 2 TIMES DAILY AS NEEDED FOR ANXIETY 20 tablet 1  . CRESTOR 20 MG tablet TAKE 1 TABLET BY MOUTH EVERY DAY (Patient taking differently: TAKE 20 MG BY MOUTH EVERY DAY) 90 tablet 3  . diphenhydramine-acetaminophen (TYLENOL PM) 25-500 MG TABS Take 2 tablets by mouth at bedtime as needed (for sleep).     [name] FLUoxetine (PROZAC) 40 MG capsule Take 1 capsule (40 mg total) by mouth daily. 90 capsule 3  . fluticasone (FLONASE) 50 MCG/ACT nasal spray Place 2 sprays into both nostrils daily. (Patient taking differently: Place 2 sprays into both nostrils daily as needed for allergies. ) 48 g 1  . furosemide (LASIX) 20 MG tablet TAKE ONE TABLET DAILY IF NEEDED FOR LEG SWELLING (Patient not taking: Reported on 02/03/2018) 30 tablet 1  . gabapentin (NEURONTIN) 300 MG capsule Take 600 mg by mouth 2 (two) times daily.     . hydrALAZINE (APRESOLINE) 25 MG tablet Take 2 tablets (50 mg total) by mouth 3 (three) times daily. 180 tablet 11  . hydrOXYzine (ATARAX/VISTARIL) 25 MG tablet TAKE 1 TABLET BY MOUTH EVERYDAY AT BEDTIME 90 tablet 1  . ibuprofen (ADVIL,MOTRIN) 200 MG tablet Take 800 mg by mouth every 6 (six) hours as needed for headache or moderate pain.     . Insulin Detemir (LEVEMIR FLEXTOUCH) 100 UNIT/ML Pen Inject 10 Units into the skin daily. 15 mL 3  . losartan (COZAAR) 100 MG tablet Take 1 tablet (100 mg total) by mouth daily. 90 tablet 3  . lovastatin (MEVACOR) 20 MG tablet Take 1 tablet (20 mg total) by mouth at bedtime. 90 tablet 3  . metFORMIN (GLUCOPHAGE-XR) 500 MG 24 hr tablet TAKE 2 TABLETS BY MOUTH TWICE A DAY 360 tablet 0  . methocarbamol (ROBAXIN)  500 MG tablet Take 1 tablet (500 mg total) by mouth 2 (two) times daily. 20 tablet 0  . metoprolol succinate (TOPROL-XL) 100 MG 24 hr tablet Take 1 tablet (100 mg total) by mouth daily. 90 tablet 3  . montelukast (SINGULAIR) 10 MG  tablet Take 1 tablet (10 mg total) by mouth at bedtime. 90 tablet 1  . nitroGLYCERIN (NITROSTAT) 0.4 MG SL tablet Place 1 tablet (0.4 mg total) under the tongue every 5 (five) minutes as needed for chest pain. 20 tablet 3  . omega-3 acid ethyl esters (LOVAZA) 1 g capsule Take 1 capsule (1 g total) by mouth 2 (two) times daily. 60 capsule 5  . polyethylene glycol (MIRALAX / GLYCOLAX) packet Take 17 g by mouth daily. 14 each 0  . Polyvinyl Alcohol-Povidone (REFRESH OP) Place 2 drops into both eyes daily as needed (for dry eyes).     No current facility-administered medications on file prior to visit.     Review of Systems:  As per HPI- otherwise negative. BP Readings from Last 3 Encounters:  02/25/18 (!) 142/66  02/19/18 126/61  01/05/18 118/60   Pulse Readings from Last 3 Encounters:  02/25/18 (!) 117  02/19/18 89  01/05/18 90    Physical Examination: Vitals:   02/25/18 0937  BP: (!) 142/66  Pulse: (!) 117  Resp: 18  Temp: 98 F (36.7 C)  SpO2: 98%   Vitals:   02/25/18 0937  Weight: 173 lb 3.2 oz (78.6 kg)  Height: 5' (1.524 m)   Body mass index is 33.83 kg/m. Ideal Body Weight: Weight in (lb) to have BMI = 25: 127.7  GEN: WDWN, NAD, Non-toxic, A & O x 3, obese, looks well Coughing some at home  HEENT: Atraumatic, Normocephalic. Neck supple. No masses, No LAD.  Bilateral TM wnl, oropharynx normal.  PEERL,EOMI.   Ears and Nose: No external deformity. CV: RRR, No M/G/R. No JVD. No thrill. No extra heart sounds. PULM: CTA B, no wheezes, crackles, rhonchi. No retractions. No resp. distress. No accessory muscle use. ABD: S, NT, ND, +BS. No rebound. No HSM. EXTR: No c/c/e NEURO Normal gait.  PSYCH: Normally interactive. Conversant. Not depressed or anxious appearing.  Calm demeanor.  Tender in her right sided thoracolumbar muscles  No redness or skin rash She is a bit tachycardic today but is in no distress   Assessment and Plan: Reactive depression  Essential  hypertension, benign - Plan: CBC, Comprehensive metabolic panel  Uncontrolled type 2 diabetes mellitus with complication, with long-term current use of insulin ([place]) - Plan: Comprehensive metabolic panel  Hyperlipidemia, unspecified hyperlipidemia type  Bacteria in urine - Plan: Urine Culture  Cough - Plan: doxycycline (VIBRAMYCIN) 100 MG capsule  Acute bronchitis with COPD (HCC) - Plan: doxycycline (VIBRAMYCIN) 100 MG capsule, Tiotropium Bromide Monohydrate (SPIRIVA RESPIMAT) 2.5 MCG/ACT AERS, DISCONTINUED: Tiotropium Bromide Monohydrate (SPIRIVA RESPIMAT) 2.5 MCG/ACT AERS  Other specified disorders of kidney and ureter - Plan: MR Abdomen W Wo Contrast  Right kidney mass - Plan: MR Abdomen W Wo Contrast  Following up from the ER today She is overall feeling better but is coughing - may have some COPD exacerbation. She does not tolerate pred well Will rx doxycycline for 10 days, restart spiriva She will let me know if not feeling better in the next few days- Sooner if worse.  Repeat urine culture today as culture from ER showed mixed bacteria  I discussed with radiology and it does seem that doing an MRI (as opposed to  CT) is our best bed so I ordered this for her today Signed Lamar Blinks, MD  Lab Results  Component Value Date   HGBA1C 9.6 (H) 01/05/2018     Received her labs- look good except for glucose She is to have a repeat A1c next month- reminded her of this in lab letter  Results for orders placed or performed in visit on 02/25/18  Urine Culture  Result Value Ref Range   MICRO NUMBER: [id number]    SPECIMEN QUALITY: ADEQUATE    Sample Source URINE    STATUS: FINAL    Result:      Three or more organisms present, each greater than 10,000 cu/mL. May represent normal Destiny Ballard contamination from external genitalia. No further testing is required.  CBC  Result Value Ref Range   WBC 8.0 4.0 - 10.5 K/uL   RBC 5.09 3.87 - 5.11 Mil/uL   Platelets 276.0 150.0 - 400.0 K/uL    Hemoglobin 12.9 12.0 - 15.0 g/dL   HCT 39.4 36.0 - 46.0 %   MCV 77.4 (L) 78.0 - 100.0 fl   MCHC 32.6 30.0 - 36.0 g/dL   RDW 13.6 11.5 - 15.5 %  Comprehensive metabolic panel  Result Value Ref Range   Sodium 136 135 - 145 mEq/L   Potassium 4.2 3.5 - 5.1 mEq/L   Chloride 95 (L) 96 - 112 mEq/L   CO2 30 19 - 32 mEq/L   Glucose, Bld 264 (H) 70 - 99 mg/dL   BUN 17 6 - 23 mg/dL   Creatinine, Ser 0.83 0.40 - 1.20 mg/dL   Total Bilirubin 0.4 0.2 - 1.2 mg/dL   Alkaline Phosphatase 104 39 - 117 U/L   AST 13 0 - 37 U/L   ALT 14 0 - 35 U/L   Total Protein 7.5 6.0 - 8.3 g/dL   Albumin 4.1 3.5 - 5.2 g/dL   Calcium 10.1 8.4 - 10.5 mg/dL   GFR 72.50 >60.00 mL/min   Received her urine culture 8/16- called pt and let her know

## 2018-02-25 ENCOUNTER — Encounter: Payer: Self-pay | Admitting: Family Medicine

## 2018-02-25 ENCOUNTER — Ambulatory Visit (INDEPENDENT_AMBULATORY_CARE_PROVIDER_SITE_OTHER): Payer: Medicare Other | Admitting: Family Medicine

## 2018-02-25 VITALS — BP 142/66 | HR 117 | Temp 98.0°F | Resp 18 | Ht 60.0 in | Wt 173.2 lb

## 2018-02-25 DIAGNOSIS — N2889 Other specified disorders of kidney and ureter: Secondary | ICD-10-CM

## 2018-02-25 DIAGNOSIS — R05 Cough: Secondary | ICD-10-CM | POA: Diagnosis not present

## 2018-02-25 DIAGNOSIS — J440 Chronic obstructive pulmonary disease with acute lower respiratory infection: Secondary | ICD-10-CM

## 2018-02-25 DIAGNOSIS — F329 Major depressive disorder, single episode, unspecified: Secondary | ICD-10-CM | POA: Diagnosis not present

## 2018-02-25 DIAGNOSIS — Z794 Long term (current) use of insulin: Secondary | ICD-10-CM

## 2018-02-25 DIAGNOSIS — IMO0002 Reserved for concepts with insufficient information to code with codable children: Secondary | ICD-10-CM

## 2018-02-25 DIAGNOSIS — E1165 Type 2 diabetes mellitus with hyperglycemia: Secondary | ICD-10-CM

## 2018-02-25 DIAGNOSIS — R8271 Bacteriuria: Secondary | ICD-10-CM | POA: Diagnosis not present

## 2018-02-25 DIAGNOSIS — J209 Acute bronchitis, unspecified: Secondary | ICD-10-CM | POA: Diagnosis not present

## 2018-02-25 DIAGNOSIS — E118 Type 2 diabetes mellitus with unspecified complications: Secondary | ICD-10-CM

## 2018-02-25 DIAGNOSIS — E785 Hyperlipidemia, unspecified: Secondary | ICD-10-CM

## 2018-02-25 DIAGNOSIS — I259 Chronic ischemic heart disease, unspecified: Secondary | ICD-10-CM | POA: Diagnosis not present

## 2018-02-25 DIAGNOSIS — I10 Essential (primary) hypertension: Secondary | ICD-10-CM | POA: Diagnosis not present

## 2018-02-25 DIAGNOSIS — R059 Cough, unspecified: Secondary | ICD-10-CM

## 2018-02-25 LAB — COMPREHENSIVE METABOLIC PANEL
ALT: 14 U/L (ref 0–35)
AST: 13 U/L (ref 0–37)
Albumin: 4.1 g/dL (ref 3.5–5.2)
Alkaline Phosphatase: 104 U/L (ref 39–117)
BUN: 17 mg/dL (ref 6–23)
CO2: 30 mEq/L (ref 19–32)
Calcium: 10.1 mg/dL (ref 8.4–10.5)
Chloride: 95 mEq/L — ABNORMAL LOW (ref 96–112)
Creatinine, Ser: 0.83 mg/dL (ref 0.40–1.20)
GFR: 72.5 mL/min (ref >60.00)
Glucose, Bld: 264 mg/dL — ABNORMAL HIGH (ref 70–99)
Potassium: 4.2 mEq/L (ref 3.5–5.1)
Sodium: 136 mEq/L (ref 135–145)
Total Bilirubin: 0.4 mg/dL (ref 0.2–1.2)
Total Protein: 7.5 g/dL (ref 6.0–8.3)

## 2018-02-25 LAB — CBC
HCT: 39.4 % (ref 36.0–46.0)
Hemoglobin: 12.9 g/dL (ref 12.0–15.0)
MCHC: 32.6 g/dL (ref 30.0–36.0)
MCV: 77.4 fl — ABNORMAL LOW (ref 78.0–100.0)
Platelets: 276 10*3/uL (ref 150.0–400.0)
RBC: 5.09 Mil/uL (ref 3.87–5.11)
RDW: 13.6 % (ref 11.5–15.5)
WBC: 8 10*3/uL (ref 4.0–10.5)

## 2018-02-25 MED ORDER — DOXYCYCLINE HYCLATE 100 MG PO CAPS
100.0000 mg | ORAL_CAPSULE | Freq: Two times a day (BID) | ORAL | 0 refills | Status: DC
Start: 2018-02-25 — End: 2018-05-11

## 2018-02-25 MED ORDER — TIOTROPIUM BROMIDE MONOHYDRATE 2.5 MCG/ACT IN AERS: 2.0000 | INHALATION_SPRAY | Freq: Every day | RESPIRATORY_TRACT | 11 refills | Status: DC

## 2018-02-25 NOTE — Patient Instructions (Signed)
It was good to see you todya as always! We will treat you with doxycycline for your cough.  I also refilled your Spiriva inhaler for your COPD- use this daily, it should help your breathing  If your cough is not better soon please alert me!  -Sooner if worse.  I am going to check with radiology and see if a CT would be just as good as MRI for you as this would be easier/ faster  I will be in touch with your labs asap!

## 2018-02-26 ENCOUNTER — Other Ambulatory Visit: Payer: Self-pay | Admitting: *Deleted

## 2018-02-26 LAB — URINE CULTURE
MICRO NUMBER:: 90965431
SPECIMEN QUALITY:: ADEQUATE

## 2018-02-26 NOTE — Patient Outreach (Signed)
[facility]) Care Management  02/26/2018  CUMI SANAGUSTIN [birth year] [id number]   [place] Monthly Outreach  Referral Date:12/25/2017 Referral Source:Transfer from [place] Reason for Referral:Continued Disease Management Education Insurance:Medicare   Outreach Attempt:  Outreach attempt #1 to patient for monthly follow up. No answer. RN Health Coach left HIPAA compliant voicemail message along with contact information.  Plan:  RN Health Coach will send unsuccessful outreach letter to patient.  RN Health Coach will make another outreach attempt to patient within 3-4 business days if no return call back from patient.   [place] [phone] Meekah [email or web address]

## 2018-02-28 ENCOUNTER — Ambulatory Visit (HOSPITAL_BASED_OUTPATIENT_CLINIC_OR_DEPARTMENT_OTHER)
Admission: RE | Admit: 2018-02-28 | Discharge: 2018-02-28 | Disposition: A | Discharge status: Home / Self Care | Payer: Medicare Other | Source: Ambulatory Visit | Attending: Family Medicine | Admitting: Family Medicine

## 2018-02-28 DIAGNOSIS — R160 Hepatomegaly, not elsewhere classified: Secondary | ICD-10-CM | POA: Insufficient documentation

## 2018-02-28 DIAGNOSIS — I700 Atherosclerosis of aorta: Secondary | ICD-10-CM | POA: Diagnosis not present

## 2018-02-28 DIAGNOSIS — N2889 Other specified disorders of kidney and ureter: Secondary | ICD-10-CM | POA: Insufficient documentation

## 2018-02-28 MED ORDER — GADOBENATE DIMEGLUMINE 529 MG/ML IV SOLN
15.0000 mL | Freq: Once | INTRAVENOUS | Status: AC | PRN
  Administered 2018-02-28: 15 mL via INTRAVENOUS

## 2018-03-01 ENCOUNTER — Encounter: Payer: Self-pay | Admitting: Family Medicine

## 2018-03-03 ENCOUNTER — Other Ambulatory Visit: Payer: Self-pay | Admitting: *Deleted

## 2018-03-03 ENCOUNTER — Encounter: Payer: Self-pay | Admitting: *Deleted

## 2018-03-03 NOTE — Patient Outreach (Signed)
[facility]) Care Management  03/03/2018  Destiny Ballard July 28, 1950 [id number]   [place] Monthly Outreach  Referral Date:12/25/2017 Referral Source:Transfer from [place] Reason for Referral:Continued Disease Management Education Insurance:Medicare   Outreach Attempt:  Successful telephone outreach to patient for monthly follow up.  HIPAA verified with patient.  States she has a cough and being treated with antibiotics.  Denies any increase in shortness of breath.  Reports using her rescue inhaler and nebulizer only a bout once a week.  States she has refilled the prescription for Spiriva.  Denies using her oxygen tank/concentrator and reports occasional use of her CPAP.  Continues to monitor blood sugars about every other day.  Fasting blood sugars yesterday was 143 with ranges 130-170's.  Denies any hypo or hyperglycemic events.  Patient reporting recent emergency room visit related to back pain.  Reports she underwent MRI to rule out renal mass.  Patient stating she was diagnosed with renal cyst that is being monitored.  Reports pain is still there, just tolerable.  Appointments:   Attended appointment with primary care provider on 02/25/2018 and needs to schedule follow up appointment.  Also needs to schedule pulmonary follow up appointment.  Plan: RN Health Coach will make next monthly outreach to patient in the month of September.  [place] Coach [phone] [email or web address]

## 2018-03-04 ENCOUNTER — Inpatient Hospital Stay (HOSPITAL_BASED_OUTPATIENT_CLINIC_OR_DEPARTMENT_OTHER): Admission: RE | Admit: 2018-03-04 | Payer: Medicare Other | Source: Ambulatory Visit

## 2018-03-17 ENCOUNTER — Other Ambulatory Visit: Payer: Self-pay | Admitting: Emergency Medicine

## 2018-03-27 ENCOUNTER — Telehealth: Payer: Self-pay

## 2018-03-27 DIAGNOSIS — H9203 Otalgia, bilateral: Secondary | ICD-10-CM

## 2018-03-27 NOTE — Telephone Encounter (Signed)
Called patient to ask what symptoms she was having. Asked  to return call.

## 2018-03-27 NOTE — Telephone Encounter (Signed)
Copied from [place] [id number]. Topic: Referral - Request >> Mar 27, 2018  1:29 PM Yvette Rack wrote: Reason for CRM: Pt requests referral to a Ear, Nose and Throat Specialist. Cb# [phone]

## 2018-03-28 ENCOUNTER — Other Ambulatory Visit: Payer: Self-pay | Admitting: Family Medicine

## 2018-03-28 DIAGNOSIS — E876 Hypokalemia: Secondary | ICD-10-CM

## 2018-03-30 NOTE — Telephone Encounter (Signed)
Called pt- she reports bilateral ear pain. I cannot see her this week so will refer to ENT

## 2018-03-30 NOTE — Addendum Note (Signed)
Addended by: Lamar Blinks C on: 03/30/2018 08:58 AM   Modules accepted: Orders

## 2018-04-03 ENCOUNTER — Telehealth: Payer: Self-pay | Admitting: Emergency Medicine

## 2018-04-03 DIAGNOSIS — J441 Chronic obstructive pulmonary disease with (acute) exacerbation: Secondary | ICD-10-CM

## 2018-04-03 DIAGNOSIS — G4733 Obstructive sleep apnea (adult) (pediatric): Secondary | ICD-10-CM

## 2018-04-03 NOTE — Telephone Encounter (Signed)
Pre the telephone  Call on 02/11/18. I will place this order to [facility].  I have called patient and she aware.  Nothing further needed at this time.

## 2018-04-08 ENCOUNTER — Encounter: Payer: Self-pay | Admitting: *Deleted

## 2018-04-08 ENCOUNTER — Other Ambulatory Visit: Payer: Self-pay | Admitting: *Deleted

## 2018-04-08 ENCOUNTER — Telehealth: Payer: Self-pay | Admitting: Nurse Practitioner

## 2018-04-08 NOTE — Telephone Encounter (Signed)
See attached

## 2018-04-08 NOTE — Telephone Encounter (Signed)
Cecille Rubin do you agree holding asprin?  Thanks.

## 2018-04-08 NOTE — Telephone Encounter (Signed)
   Primary Cardiologist: Truitt Merle, NP Chart reviewed as part of pre-operative protocol coverage. Patient was contacted 04/08/2018 in reference to pre-operative risk assessment for pending surgery as outlined below.  Destiny Ballard was last seen on 11/17/2017 by Marian Sorrow NP.  Since that day, OVIYA AMMAR has done well in her usual state of health.    She could hold asprin if needed for 3 days.   Therefore, based on ACC/AHA guidelines, the patient would be at acceptable risk for the planned procedure without further cardiovascular testing.   I will route this recommendation to the requesting party via Epic fax function and remove from pre-op pool.  Please call with questions.  Cecilie Kicks, NP 04/08/2018, 3:41 PM

## 2018-04-08 NOTE — Patient Outreach (Signed)
[facility]) Care Management  04/08/2018  Destiny Ballard 1951-01-25 [id number]   [place] Monthly Outreach  Referral Date:12/25/2017 Referral Source:Transfer from [place] Reason for Referral:Continued Disease Management Education Insurance:Medicare   Outreach Attempt:  Successful telephone outreach to patient for monthly follow up.  HIPAA verified with patient.  Patient reporting she continues to have a cold and cough from 3 weeks ago.  Reports she did not complete course of antibiotics (doxycycline) last month and has started to take the rest of the antibiotics left.  Encouraged patient to seek antibiotic need assistance from pulmonologist versus her primary care provider.  Patient also reporting pain in ears and an unsteady gait.  Denies fall.  Awaiting appointment with ENT doctor.  Endorses some shortness of breath especially with activity.  States she uses her rescue inhaler about twice a day.  Has been monitoring her blood sugars about 3 times a week.  Last fasting blood sugar was 165 with ranges in 160-180's.  Appointments:   Last attended appointment with primary care provider, Dr. Edilia Bo was 03/02/2018 and needs to schedule follow up appointment.  Needs to schedule pulmonary care appointment with Dr. Lamonte Sakai.   States she has an ENT appointment on 04/28/2018.  Plan: RN Health Coach will make next telephone outreach to patient in the month of October.  Destiny Ballard [phone] [email or web address]

## 2018-04-08 NOTE — Telephone Encounter (Signed)
Yes she may 

## 2018-04-08 NOTE — Telephone Encounter (Signed)
New Message      [facility] Pre-operative Risk Assessment    Request for surgical clearance:  1. What type of surgery is being performed? Left upper eyelid ptois repair   2. When is this surgery scheduled? 04/14/2018   3. What type of clearance is required (medical clearance vs. Pharmacy clearance to hold med vs. Both)? both  4. Are there any medications that need to be held prior to surgery and how long? Aspirin based upon cardiologist recommendation  5. Practice name and name of physician performing surgery?[facility] Dr. Isidoro Donning  6. What is your office phone number [phone]    7.   What is your office fax number [phone]  8.   Anesthesia type (None, local, MAC, general) ? Local  Destiny Ballard 04/08/2018, 2:32 PM  _________________________________________________________________   (provider comments below)

## 2018-04-10 ENCOUNTER — Telehealth: Payer: Self-pay | Admitting: Nurse Practitioner

## 2018-04-10 NOTE — Telephone Encounter (Signed)
New Message:    Eliezer Lofts with Dr. Isidoro Donning is calling for the status of the surgical  clearance    Contact: [phone]

## 2018-04-10 NOTE — Telephone Encounter (Signed)
Informed Dr. Linton Rump office the patient was cleared and paperwork has been faxed. Will fax again for good measure.  Destiny Ballard was grateful for call.

## 2018-04-14 DIAGNOSIS — H02422 Myogenic ptosis of left eyelid: Secondary | ICD-10-CM | POA: Diagnosis not present

## 2018-04-24 DIAGNOSIS — H9313 Tinnitus, bilateral: Secondary | ICD-10-CM | POA: Diagnosis not present

## 2018-04-24 DIAGNOSIS — H6983 Other specified disorders of Eustachian tube, bilateral: Secondary | ICD-10-CM | POA: Diagnosis not present

## 2018-04-24 DIAGNOSIS — H906 Mixed conductive and sensorineural hearing loss, bilateral: Secondary | ICD-10-CM | POA: Diagnosis not present

## 2018-04-24 DIAGNOSIS — H9209 Otalgia, unspecified ear: Secondary | ICD-10-CM | POA: Diagnosis not present

## 2018-04-24 DIAGNOSIS — H838X3 Other specified diseases of inner ear, bilateral: Secondary | ICD-10-CM | POA: Diagnosis not present

## 2018-05-04 ENCOUNTER — Other Ambulatory Visit: Payer: Self-pay | Admitting: *Deleted

## 2018-05-04 ENCOUNTER — Encounter: Payer: Self-pay | Admitting: *Deleted

## 2018-05-04 NOTE — Patient Outreach (Signed)
[name]) Care Management  [place]  05/04/2018   Destiny Ballard 1951-04-13 [id number]   RN Health Coach Monthly Outreach   Referral Date:  12/25/2017 Referral Source:  Transfer from [place] Reason for Referral:  Continued Disease Management Education Insurance:  Medicare    Outreach Attempt:  Successful telephone outreach to patient for monthly follow up.  HIPAA verified with patient.  Patient stating she feels much better this month, reporting she is over her cold.  Denies any out of the normal shortness of breath and using her rescue inhaler about twice last week.  Continues to report chronic cough.  Patient reports seeing ENT in consultation on 04/24/2018 and having wax removed from her left ear.  Continues to monitor her blood sugars about 3 times a week.  Reports her fasting blood sugars have been ranging greater than 200's.  Encouraged patient to monitor blood sugars at least daily and to contact primary care provider for sustained elevations for possible medication/insulin adjustments.  Last Hgb A1C was 9.6 in July 2019.  Patient stating she is depressed.  Denies any suicidal ideations.  Discussed [place] [facility] referral for depression resources, patient declines at this time.  Support offered and encouraged patient to notify [place] in the future if she is interested.  Encounter Medications:  Outpatient Encounter Medications as of 05/04/2018  Medication Sig Note  . albuterol (PROAIR HFA) 108 (90 Base) MCG/ACT inhaler Inhale 2 puffs into the lungs every 4 (four) hours as needed for wheezing or shortness of breath.   [name] albuterol (PROVENTIL) (2.5 MG/3ML) 0.083% nebulizer solution Take 3 mLs (2.5 mg total) by nebulization every 4 (four) hours as needed for wheezing or shortness of breath.   [name] aspirin EC 81 MG tablet Take 81 mg by mouth daily.   . calcium carbonate (TUMS - DOSED IN MG ELEMENTAL CALCIUM) 500 MG chewable tablet Chew 2  tablets by mouth daily as needed for indigestion or heartburn.   . cetirizine (ZYRTEC) 10 MG tablet TAKE 1 TABLET BY MOUTH DAILY AS NEEDED FOR ALLERGIES   . Cholecalciferol (VITAMIN D3) 1000 UNITS CAPS Take 1,000 Units by mouth daily.    . clonazePAM (KLONOPIN) 0.5 MG tablet TAKE 0.5 TABLETS (0.25 MG TOTAL) BY MOUTH 2 TIMES DAILY AS NEEDED FOR ANXIETY   . CRESTOR 20 MG tablet TAKE 1 TABLET BY MOUTH EVERY DAY (Patient taking differently: TAKE 20 MG BY MOUTH EVERY DAY) 05/04/2018: Reports taking daily  . diphenhydramine-acetaminophen (TYLENOL PM) 25-500 MG TABS Take 2 tablets by mouth at bedtime as needed (for sleep).    [name] FLUoxetine (PROZAC) 40 MG capsule Take 1 capsule (40 mg total) by mouth daily.   . fluticasone (FLONASE) 50 MCG/ACT nasal spray SPRAY 2 SPRAYS INTO EACH NOSTRIL EVERY DAY   . gabapentin (NEURONTIN) 300 MG capsule Take 600 mg by mouth 2 (two) times daily.    . hydrALAZINE (APRESOLINE) 25 MG tablet Take 2 tablets (50 mg total) by mouth 3 (three) times daily.   . hydrOXYzine (ATARAX/VISTARIL) 25 MG tablet TAKE 1 TABLET BY MOUTH EVERYDAY AT BEDTIME   . ibuprofen (ADVIL,MOTRIN) 200 MG tablet Take 800 mg by mouth every 6 (six) hours as needed for headache or moderate pain.    . Insulin Detemir (LEVEMIR FLEXTOUCH) 100 UNIT/ML Pen Inject 10 Units into the skin daily. 02/03/2018: Reports taking 18 units daily  . lovastatin (MEVACOR) 20 MG tablet Take 1 tablet (20 mg total) by mouth at bedtime.   [name]  metFORMIN (GLUCOPHAGE-XR) 500 MG 24 hr tablet TAKE 2 TABLETS BY MOUTH TWICE A DAY   . methocarbamol (ROBAXIN) 500 MG tablet Take 1 tablet (500 mg total) by mouth 2 (two) times daily. 05/04/2018: Reports taking as needed prn  . metoprolol succinate (TOPROL-XL) 100 MG 24 hr tablet Take 1 tablet (100 mg total) by mouth daily.   . montelukast (SINGULAIR) 10 MG tablet Take 1 tablet (10 mg total) by mouth at bedtime.   . nitroGLYCERIN (NITROSTAT) 0.4 MG SL tablet Place 1 tablet (0.4 mg total) under the  tongue every 5 (five) minutes as needed for chest pain.   [name] omega-3 acid ethyl esters (LOVAZA) 1 g capsule Take 1 capsule (1 g total) by mouth 2 (two) times daily.   . polyethylene glycol (MIRALAX / GLYCOLAX) packet Take 17 g by mouth daily.   . Polyvinyl Alcohol-Povidone (REFRESH OP) Place 2 drops into both eyes daily as needed (for dry eyes).   . Tiotropium Bromide Monohydrate (SPIRIVA RESPIMAT) 2.5 MCG/ACT AERS Inhale 2 puffs into the lungs daily.   [name] doxycycline (VIBRAMYCIN) 100 MG capsule Take 1 capsule (100 mg total) by mouth 2 (two) times daily. (Patient not taking: Reported on 05/04/2018) 05/04/2018: Reports completed course of antibiotics  . furosemide (LASIX) 20 MG tablet TAKE ONE TABLET DAILY IF NEEDED FOR LEG SWELLING (Patient not taking: Reported on 02/03/2018) 02/03/2018: Currently not taking, no refills on the prescription  . KLOR-CON 10 10 MEQ tablet TAKE 1 TABLET (10 MEQ TOTAL) BY MOUTH DAILY. TAKE ONLY ON DAYS THAT YOU USE LASIX/ FUROSEMIDE (Patient not taking: Reported on 05/04/2018)   . losartan (COZAAR) 100 MG tablet Take 1 tablet (100 mg total) by mouth daily. 02/03/2018: Currently taking   No facility-administered encounter medications on file as of 05/04/2018.     Functional Status:  In your present state of health, do you have any difficulty performing the following activities: 02/03/2018 10/24/2017  Hearing? N N  Vision? N N  Comment - -  Difficulty concentrating or making decisions? N N  Walking or climbing stairs? Y Y  Comment legs are weak -  Dressing or bathing? N N  Doing errands, shopping? N -  Preparing Food and eating ? N N  Using the Toilet? N N  In the past six months, have you accidently leaked urine? Y Y  Do you have problems with loss of bowel control? Y N  Managing your Medications? N N  Managing your Finances? N N  Housekeeping or managing your Housekeeping? N N  Some recent data might be hidden    Fall/Depression Screening: Fall Risk  05/04/2018  02/03/2018 09/01/2017  Falls in the past year? No No No  Risk for fall due to : Impaired balance/gait;Impaired mobility;Medication side effect Impaired balance/gait;Impaired mobility;Medication side effect -   PHQ 2/9 Scores 02/03/2018 10/24/2017 09/01/2017 08/27/2016 09/20/2015  PHQ - 2 Score 2 6 6 3  0  PHQ- 9 Score 6 9 21 15  -  Exception Documentation - - - - Patient refusal   THN CM Care Plan Problem One     Most Recent Value  Care Plan Problem One  Knowledge deficiet of COPD diagnosis and self care management of diabetes  Role Documenting the Problem One  Health Coach  Care Plan for Problem One  Active  THN Long Term Goal   Patient will schedule and attend appointment with primary care provider within the next 90 days.  THN Long Term Goal Start Date  05/04/18  [facility]  Long Term Goal Met Date  05/04/18  Interventions for Problem One Long Term Goal  Reviewed and discussed curernt care plan and goals with patient, discussed importance of medical follow up and encouraged patient to make primary care follow up appointment and attend appointment, reviewed medications and encouraged medication compliance, offered [facility] Social Work referral for depression (patient declines at this time),  encouraged pateint to discuss elevated blood sugars with primary care provider  [facility] CM Short Term Goal #1   Patient will report monitoring fasting blood sugars at least daily in the next 90 days.  THN CM Short Term Goal #1 Start Date  05/04/18  Interventions for Short Term Goal #1  Discussed importance of blood sugar monitoring with patient, discussed carbohydrte modified diet with patient, encouraged healthier meal and drink options, encouraged patient to monitor blood sugars at least daily and to notify primary care provider for sustained elevations, discussed current Hgb A1C and goal A1C with patient  THN CM Short Term Goal #2   Patient will schedule pulmonary follow up appointment with Dr. Lamonte Sakai in the next 90 days.  THN CM  Short Term Goal #2 Start Date  05/04/18  Interventions for Short Term Goal #2  Confirmed patient still needs to schedule pulmonary follow up appointment, discussed importance of pulmonary follow up, encouraged patient to schedule appointment with pulmonologist  [facility] CM Short Term Goal #3  Patient will report resolution of her cold in the next 30 days.  THN CM Short Term Goal #3 Start Date  04/08/18  [facility] CM Short Term Goal #3 Met Date  05/04/18     Appointments:  Patient last saw primary care provider in August 2019 and does not have follow up scheduled.  Encouraged patient to contact primary care provider and schedule follow up appointment and also to schedule appointment with pulmonologist.  Stated she would do so. States she has schedule follow up appointment with ENT in November 2019.  Plan: RN Health Coach will send primary care provider Quarterly Update. RN Health Coach will make next telephone outreach to patient in the month of January.  [place] [phone] [email or web address]

## 2018-05-10 NOTE — Progress Notes (Addendum)
Destiny Ballard at [facility] [address], [place], [place] [address] [phone]  Date:  05/11/2018   Name:  Destiny Ballard   DOB:  07-06-1951   MRN:  [id number]  PCP:  Darreld Mclean, MD    Chief Complaint: Handicap Form   History of Present Illness:  Destiny Ballard is a 67 y.o. very pleasant female patient who presents with the following:  Here today to discuss a handicapped form- she cannot walk very long distances and would like a placard for her car, this is fine with me  She is using tylenol pm for insomnia.  However it does not always work. Destiny Ballard had some trazodone left in the house and she tried it, worked great for her.  She would like to continue to use this if she can  She is also on prozac 40 mg a day and feels that her mood is ok  History of CHF, COPD, HTN, DM I last saw her in August She lost her husband Destiny Ballard not too long go which has been hard, but she feels like she is adjusting and getting back on her feet   Lab Results  Component Value Date   HGBA1C 9.6 (H) 01/05/2018   She is taking lovaza but it causes a lot of burping- she will try taking it once a day for a while and then try twice a day again  She will see her cardiologist next month for a recheck   She is not using her klonopin as she has not needed it Robaxin on occasion Breathing is ok, she is continuing to use her spiriva   She is not having to use lasix now - and is not taking potassium either. She knows to take them both or neither   Flu shot given today   Albuterol prn spiriva Klonopin prn prozac 40 Lasix prn Gabapentin 300 BID Hydralazine levemir Lovastatin Metformin toprol xl singulair lovaza Losartan 100    Patient Active Problem List   Diagnosis Date Noted  . Lobar pneumonia, unspecified organism ([place]) 11/13/2017  . Abnormal cardiovascular stress test   . Precordial chest pain   . Sepsis ([place]) 09/18/2017  . Chronic  diastolic CHF (congestive heart failure) ([place]) 09/18/2017  . Acute respiratory failure with hypoxia ([place]) 09/18/2017  . Left hip pain 10/28/2016  . Chronic rhinitis 08/07/2016  . COPD with acute exacerbation ([place]) 04/25/2016  . Essential hypertension 01/30/2016  . High blood triglycerides   . Hypokalemia 01/27/2016  . Depression with anxiety   . CAD (coronary artery disease)   . HLD (hyperlipidemia)   . Type 2 diabetes mellitus without complication, without long-term current use of insulin ([place])   . OSA (obstructive sleep apnea) 05/06/2014  . Dyspnea on exertion 09/02/2012  . Chest pain 12/10/2011  . Chronic cough 05/11/2010  . Obesity (BMI 30-39.9)   . GERD 06/28/2008    Past Medical History:  Diagnosis Date  . Anxiety    Prior suicide attempt  . CAD (coronary artery disease)    a) s/p DES to LAD 07/2005 b) Last Myoview low risk 11/2011 showing small fixed apical perfusion defect (prior MI vs attenuation) but no ischemia - normal EF.  Destiny Ballard Cervical spondylosis   . Coronary atherosclerosis 06/28/2008  . Depression   . Depression with anxiety   . Diabetes mellitus without complication ([place])   . GERD (gastroesophageal reflux disease)   . Hyperlipidemia   . Hypertension   .  Insulin resistance   . Iron deficiency anemia   . Obesity     Past Surgical History:  Procedure Laterality Date  . BREAST ENHANCEMENT SURGERY    . CARDIAC CATHETERIZATION  06/17/2007   NORMAL. EF 60%  . CARDIAC CATHETERIZATION N/A 01/29/2016   Procedure: Left Heart Cath and Coronary Angiography;  Surgeon: Sherren Mocha, MD;  Location: [facility];  Service: Cardiovascular;  Laterality: N/A;  . CERVICAL SPONDYLOSIS     SINGLE LEVEL FUSION  . CHILDBIRTH     X3  . CORONARY STENT PLACEMENT  07/2005   LEFT ANTERIOR DESCENDING  . FOREARM FRACTURE SURGERY  2010   hand and shoulder   . INCISION AND DRAINAGE BREAST ABSCESS  01/05/2012      . INCISION AND DRAINAGE PERIRECTAL ABSCESS N/A 02/18/2014    Procedure: IRRIGATION AND DEBRIDEMENT PERIRECTAL ABSCESS;  Surgeon: Pedro Earls, MD;  Location: [facility];  Service: General;  Laterality: N/A;  . LEFT HEART CATH AND CORONARY ANGIOGRAPHY N/A 10/10/2017   Procedure: LEFT HEART CATH AND CORONARY ANGIOGRAPHY;  Surgeon: Burnell Blanks, MD;  Location: [facility];  Service: Cardiovascular;  Laterality: N/A;  . LUMBAR LAMINECTOMY    . ROTATOR CUFF REPAIR     bilaterla  . TONSILLECTOMY AND ADENOIDECTOMY    . TUBAL LIGATION    . VIDEO BRONCHOSCOPY Bilateral 08/13/2016   Procedure: VIDEO BRONCHOSCOPY WITHOUT FLUORO;  Surgeon: Collene Gobble, MD;  Location: [facility];  Service: Cardiopulmonary;  Laterality: Bilateral;    Social History   Tobacco Use  . Smoking status: Never Smoker  . Smokeless tobacco: Never Used  Substance Use Topics  . Alcohol use: Yes    Comment: occ  . Drug use: No    Family History  Problem Relation Age of Onset  . Heart attack Mother   . Diabetes Mother   . Lung cancer Mother   . Asthma Mother   . Heart disease Mother   . Suicidality Father        "killed himself"  . Asthma Daughter        x 2  . Cancer Daughter        pre-cancerous polyp  . Diabetes Sister   . Cancer Sister   . Cervical cancer Daughter        cervical   . Allergies Other        all family--seasonal allergies    Allergies  Allergen Reactions  . Prednisone Other (See Comments)    REACTION: mood swings, nightmares. "Shot doesn't bother me, reaction is just with the pill" she states she has had the steroid injections before. From our records methylprednisone was given to her in 2013 without any complications.    Medication list has been reviewed and updated.  Current Outpatient Medications on File Prior to Visit  Medication Sig Dispense Refill  . albuterol (PROAIR HFA) 108 (90 Base) MCG/ACT inhaler Inhale 2 puffs into the lungs every 4 (four) hours as needed for wheezing or shortness of breath. 3 Inhaler 1  .  albuterol (PROVENTIL) (2.5 MG/3ML) 0.083% nebulizer solution Take 3 mLs (2.5 mg total) by nebulization every 4 (four) hours as needed for wheezing or shortness of breath. 75 mL 12  . aspirin EC 81 MG tablet Take 81 mg by mouth daily.    . calcium carbonate (TUMS - DOSED IN MG ELEMENTAL CALCIUM) 500 MG chewable tablet Chew 2 tablets by mouth daily as needed for indigestion or heartburn.    Destiny Ballard  cetirizine (ZYRTEC) 10 MG tablet TAKE 1 TABLET BY MOUTH DAILY AS NEEDED FOR ALLERGIES 90 tablet 1  . Cholecalciferol (VITAMIN D3) 1000 UNITS CAPS Take 1,000 Units by mouth daily.     . clonazePAM (KLONOPIN) 0.5 MG tablet TAKE 0.5 TABLETS (0.25 MG TOTAL) BY MOUTH 2 TIMES DAILY AS NEEDED FOR ANXIETY 20 tablet 1  . diphenhydramine-acetaminophen (TYLENOL PM) 25-500 MG TABS Take 2 tablets by mouth at bedtime as needed (for sleep).     Destiny Ballard FLUoxetine (PROZAC) 40 MG capsule Take 1 capsule (40 mg total) by mouth daily. 90 capsule 3  . fluticasone (FLONASE) 50 MCG/ACT nasal spray SPRAY 2 SPRAYS INTO EACH NOSTRIL EVERY DAY 48 g 0  . furosemide (LASIX) 20 MG tablet TAKE ONE TABLET DAILY IF NEEDED FOR LEG SWELLING 30 tablet 1  . gabapentin (NEURONTIN) 300 MG capsule Take 600 mg by mouth 2 (two) times daily.     . hydrALAZINE (APRESOLINE) 25 MG tablet Take 2 tablets (50 mg total) by mouth 3 (three) times daily. 180 tablet 11  . hydrOXYzine (ATARAX/VISTARIL) 25 MG tablet TAKE 1 TABLET BY MOUTH EVERYDAY AT BEDTIME 90 tablet 1  . ibuprofen (ADVIL,MOTRIN) 200 MG tablet Take 800 mg by mouth every 6 (six) hours as needed for headache or moderate pain.     . Insulin Detemir (LEVEMIR FLEXTOUCH) 100 UNIT/ML Pen Inject 10 Units into the skin daily. 15 mL 3  . lovastatin (MEVACOR) 20 MG tablet Take 1 tablet (20 mg total) by mouth at bedtime. 90 tablet 3  . metFORMIN (GLUCOPHAGE-XR) 500 MG 24 hr tablet TAKE 2 TABLETS BY MOUTH TWICE A DAY 360 tablet 0  . methocarbamol (ROBAXIN) 500 MG tablet Take 1 tablet (500 mg total) by mouth 2 (two)  times daily. 20 tablet 0  . metoprolol succinate (TOPROL-XL) 100 MG 24 hr tablet Take 1 tablet (100 mg total) by mouth daily. 90 tablet 3  . montelukast (SINGULAIR) 10 MG tablet Take 1 tablet (10 mg total) by mouth at bedtime. 90 tablet 1  . nitroGLYCERIN (NITROSTAT) 0.4 MG SL tablet Place 1 tablet (0.4 mg total) under the tongue every 5 (five) minutes as needed for chest pain. 20 tablet 3  . omega-3 acid ethyl esters (LOVAZA) 1 g capsule Take 1 capsule (1 g total) by mouth 2 (two) times daily. 60 capsule 5  . polyethylene glycol (MIRALAX / GLYCOLAX) packet Take 17 g by mouth daily. 14 each 0  . Polyvinyl Alcohol-Povidone (REFRESH OP) Place 2 drops into both eyes daily as needed (for dry eyes).    . Tiotropium Bromide Monohydrate (SPIRIVA RESPIMAT) 2.5 MCG/ACT AERS Inhale 2 puffs into the lungs daily. 1 Inhaler 11  . KLOR-CON 10 10 MEQ tablet TAKE 1 TABLET (10 MEQ TOTAL) BY MOUTH DAILY. TAKE ONLY ON DAYS THAT YOU USE LASIX/ FUROSEMIDE (Patient not taking: Reported on 05/04/2018) 90 tablet 1  . losartan (COZAAR) 100 MG tablet Take 1 tablet (100 mg total) by mouth daily. 90 tablet 3   No current facility-administered medications on file prior to visit.     Review of Systems:  As per HPI- otherwise negative. No fever or chills No CP or SOB    Physical Examination: Vitals:   05/11/18 1454  BP: 128/60  Pulse: 81  Resp: 20  Temp: 98.3 F (36.8 C)  SpO2: 94%   Vitals:   05/11/18 1454  Weight: 176 lb (79.8 kg)  Height: 5' (1.524 m)   Body mass index is 34.37 kg/m. Ideal Body  Weight: Weight in (lb) to have BMI = 25: 127.7  GEN: WDWN, NAD, Non-toxic, A & O x 3, overweight, looks well and in good spirits today  HEENT: Atraumatic, Normocephalic. Neck supple. No masses, No LAD. Ears and Nose: No external deformity. CV: RRR, No M/G/R. No JVD. No thrill. No extra heart sounds. PULM: CTA B, no wheezes, crackles, rhonchi. No retractions. No resp. distress. No accessory muscle use. ABD:  S, NT, ND EXTR: No c/c/e NEURO Normal gait.  PSYCH: Normally interactive. Conversant. Not depressed or anxious appearing.  Calm demeanor.    Assessment and Plan: Adjustment insomnia - Plan: traZODone (DESYREL) 50 MG tablet  Uncontrolled type 2 diabetes mellitus with complication, with long-term current use of insulin (HCC) - Plan: Basic metabolic panel, Hemoglobin A1c  Reactive depression  A1c pending today- will then address with pt and adjust meds if needed She tried trazodone on her own and liked it. Ok to continue, but will need to monitor her sodium and EKG  Discussed signs and sx of serotonin syndrome with her today  Made an appt in about 2 weeks to check her EKG- however she may be able to have this done by cardiology when she sees them in about one week  Her most recent EKG on file from June is reviewed, Qtc was wnl then   Signed Lamar Blinks, MD  Received her labs 10/30- Results for orders placed or performed in visit on 67/89/38  Basic metabolic panel  Result Value Ref Range   Sodium 137 135 - 145 mEq/L   Potassium 3.9 3.5 - 5.1 mEq/L   Chloride 99 96 - 112 mEq/L   CO2 28 19 - 32 mEq/L   Glucose, Bld 293 (H) 70 - 99 mg/dL   BUN 15 6 - 23 mg/dL   Creatinine, Ser 0.82 0.40 - 1.20 mg/dL   Calcium 9.3 8.4 - 10.5 mg/dL   GFR 73.47 >60.00 mL/min  Hemoglobin A1c  Result Value Ref Range   Hgb A1c MFr Bld 9.5 (H) 4.6 - 6.5 %   A1c is still high, called her to discus  Metformin 1000 BID, levemir 20 daily Will add SGLT-2 inhibitor- farxiga 5 mg  Then plan to visit in 3-4 months to repeat A1c She states understanding and agreement

## 2018-05-11 ENCOUNTER — Encounter: Payer: Self-pay | Admitting: Family Medicine

## 2018-05-11 ENCOUNTER — Ambulatory Visit (INDEPENDENT_AMBULATORY_CARE_PROVIDER_SITE_OTHER): Payer: Medicare Other | Admitting: Family Medicine

## 2018-05-11 VITALS — BP 128/60 | HR 81 | Temp 98.3°F | Resp 20 | Ht 60.0 in | Wt 176.0 lb

## 2018-05-11 DIAGNOSIS — I259 Chronic ischemic heart disease, unspecified: Secondary | ICD-10-CM

## 2018-05-11 DIAGNOSIS — F5102 Adjustment insomnia: Secondary | ICD-10-CM | POA: Diagnosis not present

## 2018-05-11 DIAGNOSIS — F329 Major depressive disorder, single episode, unspecified: Secondary | ICD-10-CM | POA: Diagnosis not present

## 2018-05-11 DIAGNOSIS — E1165 Type 2 diabetes mellitus with hyperglycemia: Secondary | ICD-10-CM | POA: Diagnosis not present

## 2018-05-11 DIAGNOSIS — E118 Type 2 diabetes mellitus with unspecified complications: Secondary | ICD-10-CM | POA: Diagnosis not present

## 2018-05-11 DIAGNOSIS — Z794 Long term (current) use of insulin: Secondary | ICD-10-CM | POA: Diagnosis not present

## 2018-05-11 DIAGNOSIS — IMO0002 Reserved for concepts with insufficient information to code with codable children: Secondary | ICD-10-CM

## 2018-05-11 DIAGNOSIS — Z23 Encounter for immunization: Secondary | ICD-10-CM

## 2018-05-11 MED ORDER — TRAZODONE HCL 50 MG PO TABS: 25.0000 mg | ORAL_TABLET | Freq: Every evening | ORAL | 3 refills | Status: DC | PRN

## 2018-05-11 NOTE — Patient Instructions (Signed)
Good to see you again today!   I will be in touch with your labs from today asap Use the trazodone at bedtime as needed for insomnia.  We will see you back in 2-3 weeks for a follow-up visit

## 2018-05-12 LAB — BASIC METABOLIC PANEL
BUN: 15 mg/dL (ref 6–23)
CO2: 28 mEq/L (ref 19–32)
Calcium: 9.3 mg/dL (ref 8.4–10.5)
Chloride: 99 mEq/L (ref 96–112)
Creatinine, Ser: 0.82 mg/dL (ref 0.40–1.20)
GFR: 73.47 mL/min (ref >60.00)
Glucose, Bld: 293 mg/dL — ABNORMAL HIGH (ref 70–99)
Potassium: 3.9 mEq/L (ref 3.5–5.1)
Sodium: 137 mEq/L (ref 135–145)

## 2018-05-12 LAB — HEMOGLOBIN A1C: Hgb A1c MFr Bld: 9.5 % — ABNORMAL HIGH (ref 4.6–6.5)

## 2018-05-13 MED ORDER — DAPAGLIFLOZIN PROPANEDIOL 5 MG PO TABS: 5.0000 mg | ORAL_TABLET | Freq: Every day | ORAL | 6 refills | Status: DC

## 2018-05-13 NOTE — Addendum Note (Signed)
Addended by: Lamar Blinks C on: 05/13/2018 05:32 PM   Modules accepted: Orders

## 2018-05-14 ENCOUNTER — Other Ambulatory Visit: Payer: Self-pay | Admitting: Nurse Practitioner

## 2018-05-18 NOTE — Progress Notes (Signed)
CARDIOLOGY OFFICE NOTE  Date:  05/19/2018    Destiny Ballard Date of Birth: 1950-08-05 Medical Record #[id number]  PCP:  Darreld Mclean, MD  Cardiologist:  Servando Snare    Chief Complaint  Patient presents with  . Coronary Artery Disease  . Dizziness    6 month check     History of Present Illness: Destiny Ballard is a 67 y.o. female who presents today for a 6 month check.  Former patient of Dr. Susa Simmonds and Dr. Claris Gladden - has primarily followed with meover the past few years.   She has amedical history significant of hypertension, hyperlipidemia, diabetes mellitus, GERD, anxiety, morbid obesity,&CAD, s/p stent placement 10/18/2005.Her last cath was in 10-18-17 was stable. She has had a tendency towards medication non compliance.  I have followed her over the past year - her husband "[name]" died right after Christmas in 10/18/16. She has had lots of grief - chest pain - had intermediate Myoview and was recathed - this was stable. She has had a chronic cough and is followed with pulmonary as well. I last saw her in may - she was felt to be stable.   Comes in today. [place]. Seems to be doing ok. 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 Little dizzy today. But otherwise has done ok. Easily can lose her balance. No real chest pain. She notes it is difficult to exercise. She likes to just stay at home. Her sugars are not controlled - running pretty high - she really does not seem to be too concerned. Admits she would like to just "lay down and die" because she wants to be with [name] - still missing him terribly.   Past Medical History:  Diagnosis Date  . Anxiety    Prior suicide attempt  . CAD (coronary artery disease)    a) s/p DES to LAD 07/2005 b) Last Myoview low risk 11/2011 showing small fixed apical perfusion defect (prior MI vs attenuation) but no ischemia - normal EF.  [name] Cervical spondylosis   . Coronary atherosclerosis 06/28/2008  . Depression   .  Depression with anxiety   . Diabetes mellitus without complication ([place])   . GERD (gastroesophageal reflux disease)   . Hyperlipidemia   . Hypertension   . Insulin resistance   . Iron deficiency anemia   . Obesity     Past Surgical History:  Procedure Laterality Date  . BREAST ENHANCEMENT SURGERY    . CARDIAC CATHETERIZATION  06/17/2007   NORMAL. EF 60%  . CARDIAC CATHETERIZATION N/A 01/29/2016   Procedure: Left Heart Cath and Coronary Angiography;  Surgeon: Sherren Mocha, MD;  Location: [facility];  Service: Cardiovascular;  Laterality: N/A;  . CERVICAL SPONDYLOSIS     SINGLE LEVEL FUSION  . CHILDBIRTH     X3  . CORONARY STENT PLACEMENT  07/2005   LEFT ANTERIOR DESCENDING  . FOREARM FRACTURE SURGERY  2008/10/18   hand and shoulder   . INCISION AND DRAINAGE BREAST ABSCESS  01/05/2012      . INCISION AND DRAINAGE PERIRECTAL ABSCESS N/A 02/18/2014   Procedure: IRRIGATION AND DEBRIDEMENT PERIRECTAL ABSCESS;  Surgeon: Pedro Earls, MD;  Location: [facility];  Service: General;  Laterality: N/A;  . LEFT HEART CATH AND CORONARY ANGIOGRAPHY N/A 10/10/2017   Procedure: LEFT HEART CATH AND CORONARY ANGIOGRAPHY;  Surgeon: Burnell Blanks, MD;  Location: [facility];  Service: Cardiovascular;  Laterality: N/A;  . LUMBAR LAMINECTOMY    .  ROTATOR CUFF REPAIR     bilaterla  . TONSILLECTOMY AND ADENOIDECTOMY    . TUBAL LIGATION    . VIDEO BRONCHOSCOPY Bilateral 08/13/2016   Procedure: VIDEO BRONCHOSCOPY WITHOUT FLUORO;  Surgeon: Collene Gobble, MD;  [facility];  Service: Cardiopulmonary;  Laterality: Bilateral;     Medications: Current Meds  Medication Sig  . albuterol (PROAIR HFA) 108 (90 Base) MCG/ACT inhaler Inhale 2 puffs into the lungs every 4 (four) hours as needed for wheezing or shortness of breath.  [name] albuterol (PROVENTIL) (2.5 MG/3ML) 0.083% nebulizer solution Take 3 mLs (2.5 mg total) by nebulization every 4 (four) hours as needed for wheezing or  shortness of breath.  [name] aspirin EC 81 MG tablet Take 81 mg by mouth daily.  . calcium carbonate (TUMS - DOSED IN MG ELEMENTAL CALCIUM) 500 MG chewable tablet Chew 2 tablets by mouth daily as needed for indigestion or heartburn.  . cetirizine (ZYRTEC) 10 MG tablet TAKE 1 TABLET BY MOUTH DAILY AS NEEDED FOR ALLERGIES  . Cholecalciferol (VITAMIN D3) 1000 UNITS CAPS Take 1,000 Units by mouth daily.   . clonazePAM (KLONOPIN) 0.5 MG tablet TAKE 0.5 TABLETS (0.25 MG TOTAL) BY MOUTH 2 TIMES DAILY AS NEEDED FOR ANXIETY  . dapagliflozin propanediol (FARXIGA) 5 MG TABS tablet Take 5 mg by mouth daily.  [name] FLUoxetine (PROZAC) 40 MG capsule Take 1 capsule (40 mg total) by mouth daily.  . fluticasone (FLONASE) 50 MCG/ACT nasal spray SPRAY 2 SPRAYS INTO EACH NOSTRIL EVERY DAY  . furosemide (LASIX) 20 MG tablet TAKE ONE TABLET DAILY IF NEEDED FOR LEG SWELLING  . gabapentin (NEURONTIN) 300 MG capsule Take 600 mg by mouth 2 (two) times daily.   . hydrALAZINE (APRESOLINE) 25 MG tablet Take 2 tablets (50 mg total) by mouth 3 (three) times daily.  . hydrOXYzine (ATARAX/VISTARIL) 25 MG tablet TAKE 1 TABLET BY MOUTH EVERYDAY AT BEDTIME  . ibuprofen (ADVIL,MOTRIN) 200 MG tablet Take 800 mg by mouth every 6 (six) hours as needed for headache or moderate pain.   . Insulin Detemir (LEVEMIR FLEXTOUCH) 100 UNIT/ML Pen Inject 10 Units into the skin daily.  [name] KLOR-CON 10 10 MEQ tablet TAKE 1 TABLET (10 MEQ TOTAL) BY MOUTH DAILY. TAKE ONLY ON DAYS THAT YOU USE LASIX/ FUROSEMIDE  . losartan (COZAAR) 100 MG tablet Take 1 tablet (100 mg total) by mouth daily.  [name] lovastatin (MEVACOR) 20 MG tablet Take 1 tablet (20 mg total) by mouth at bedtime.  . metFORMIN (GLUCOPHAGE-XR) 500 MG 24 hr tablet TAKE 2 TABLETS BY MOUTH TWICE A DAY  . methocarbamol (ROBAXIN) 500 MG tablet Take 1 tablet (500 mg total) by mouth 2 (two) times daily.  . metoprolol succinate (TOPROL-XL) 100 MG 24 hr tablet Take 1 tablet (100 mg total) by mouth daily.  .  montelukast (SINGULAIR) 10 MG tablet Take 1 tablet (10 mg total) by mouth at bedtime.  . nitroGLYCERIN (NITROSTAT) 0.4 MG SL tablet Place 1 tablet (0.4 mg total) under the tongue every 5 (five) minutes as needed for chest pain.  [name] omega-3 acid ethyl esters (LOVAZA) 1 g capsule TAKE 1 CAPSULE (1 G TOTAL) BY MOUTH 2 (TWO) TIMES DAILY.  [name] polyethylene glycol (MIRALAX / GLYCOLAX) packet Take 17 g by mouth daily.  . Polyvinyl Alcohol-Povidone (REFRESH OP) Place 2 drops into both eyes daily as needed (for dry eyes).  . Tiotropium Bromide Monohydrate (SPIRIVA RESPIMAT) 2.5 MCG/ACT AERS Inhale 2 puffs into the lungs daily.  . traZODone (DESYREL) 50 MG  tablet Take 0.5-1 tablets (25-50 mg total) by mouth at bedtime as needed for sleep.  . [DISCONTINUED] diphenhydramine-acetaminophen (TYLENOL PM) 25-500 MG TABS Take 2 tablets by mouth at bedtime as needed (for sleep).      Allergies: Allergies  Allergen Reactions  . Prednisone Other (See Comments)    REACTION: mood swings, nightmares. "Shot doesn't bother me, reaction is just with the pill" she states she has had the steroid injections before. From our records methylprednisone was given to her in 2013 without any complications.    Social History: The patient  reports that she has never smoked. She has never used smokeless tobacco. She reports that she drinks alcohol. She reports that she does not use drugs.   Family History: The patient's family history includes Allergies in her other; Asthma in her daughter and mother; Cancer in her daughter and sister; Cervical cancer in her daughter; Diabetes in her mother and sister; Heart attack in her mother; Heart disease in her mother; Lung cancer in her mother; Suicidality in her father.   Review of Systems: Please see the history of present illness.   Otherwise, the review of systems is positive for none.   All other systems are reviewed and negative.   Physical Exam: VS:  BP (!) 150/84 (BP Location: Left  Arm, Patient Position: Sitting, Cuff Size: Normal)   Pulse 75   Ht 5' (1.524 m)   Wt 175 lb (79.4 kg)   SpO2 93%   BMI 34.18 kg/m  .  BMI Body mass index is 34.18 kg/m.  Wt Readings from Last 3 Encounters:  05/19/18 175 lb (79.4 kg)  05/11/18 176 lb (79.8 kg)  02/25/18 173 lb 3.2 oz (78.6 kg)   Repeat BP by me is 138/70.   General: Alert. She is obese and in no acute distress.  HEENT: Normal.  Neck: Supple, no JVD, carotid bruits, or masses noted.  Cardiac: Regular rate and rhythm. Harsh outflow murmur. No edema.  Respiratory:  Lungs are clear to auscultation bilaterally with normal work of breathing.  GI: Soft and nontender.  MS: No deformity or atrophy. Gait and ROM intact.  Skin: Warm and dry. Color is normal.  Neuro:  Strength and sensation are intact and no gross focal deficits noted.  Psych: Alert, appropriate and with normal affect.   LABORATORY DATA:  EKG:  EKG is not ordered today.  Lab Results  Component Value Date   WBC 8.0 02/25/2018   HGB 12.9 02/25/2018   HCT 39.4 02/25/2018   PLT 276.0 02/25/2018   GLUCOSE 293 (H) 05/11/2018   CHOL 239 (H) 04/03/2017   TRIG 389.0 (H) 04/03/2017   HDL 52.20 04/03/2017   LDLDIRECT 100.0 04/03/2017   LDLCALC UNABLE TO CALCULATE IF TRIGLYCERIDE OVER 400 mg/dL 01/27/2016   ALT 14 02/25/2018   AST 13 02/25/2018   NA 137 05/11/2018   K 3.9 05/11/2018   CL 99 05/11/2018   CREATININE 0.82 05/11/2018   BUN 15 05/11/2018   CO2 28 05/11/2018   TSH 2.361 01/27/2016   INR 0.9 10/06/2017   HGBA1C 9.5 (H) 05/11/2018   MICROALBUR 7.8 (H) 01/05/2018     BNP (last 3 results) Recent Labs    09/18/17 0348  BNP 45.7    ProBNP (last 3 results) No results for input(s): PROBNP in the last 8760 hours.   Other Studies Reviewed Today:  Cardiac Cath Conclusion 09/2017    Prox RCA lesion is 20% stenosed.  Dist RCA lesion is 20% stenosed.  Ost 2nd Mrg to 2nd Mrg lesion is 40% stenosed.  Prox Cx lesion is 20%  stenosed.  Ost LAD to Prox LAD lesion is 10% stenosed.  Prox LAD to Dist LAD lesion is 20% stenosed.  1. Patent stent mid LAD with minimal restenosis.  2. Mild non-obstructive disease in the RCA, Circumflex and distal LAD  Recommendations: Continue medical management of CAD.      MyoviewStudy Highlights2/2019    Nuclear stress EF: 64%.  There was no ST segment deviation noted during stress.  Findings consistent with ischemia.  This is an intermediate risk study.  The left ventricular ejection fraction is normal (55-65%).  1. EF 64%, normal wall motion.  2. Partially reversible medium-sized, moderate intensity mid to apical anteroseptal and apical anterior perfusion defect. This is concerning for ischemia.   Intermediate risk study.      EchoStudy Conclusions2/2019  - Left ventricle: The cavity size was normal. There was moderate concentric hypertrophy. Systolic function was normal. The estimated ejection fraction was in the range of 60% to 65%. Wall motion was normal; there were no regional wall motion abnormalities. There was an increased relative contribution of atrial contraction to ventricular filling. Doppler parameters are consistent with abnormal left ventricular relaxation (grade 1 diastolic dysfunction). - Aortic valve: Valve area (VTI): 1.97 cm^2. Valve area (Vmax): 1.67 cm^2. Valve area (Vmean): 1.71 cm^2. - Left atrium: The atrium was mildly dilated. - Pulmonary arteries: Systolic pressure could not be accurately estimated.    Assessment/Plan:  1.CAD - prior stent in 2007 - s/p cath from 09/2017 which was stable - she is to continued with CV risk factor modification - she really has no desire to follow thru - her grief continues to be the crux of her issues.    2.HTN:recheck by me is ok - no changes made today.   3.GERD: -on chronic PPI therapy - not discussed.   4.DM-II:getting worse -  followed by PCP - she really does not seem concerned about this.   5.HLD - on multiple agents. Needs lab today.   6.Chronic cough and chronic mild SOB: not really noted today.   7. Situational stress/grief - this still seems to be the crux of her issues. She has started doing some animal rehab/rescue - this may help. Very sad situation that I do not see changing.   Current medicines are reviewed with the patient today.  The patient does not have concerns regarding medicines other than what has been noted above.  The following changes have been made:  See above.  Labs/ tests ordered today include:    Orders Placed This Encounter  Procedures  . Lipid panel  . Hepatic function panel     Disposition:   FU with me in 6 months.   Patient is agreeable to this plan and will call if any problems develop in the interim.   SignedTruitt Merle, NP  05/19/2018 9:27 AM  [facility] [address] [place], [place]  [address] Phone: [phone] Fax: [phone]

## 2018-05-19 ENCOUNTER — Ambulatory Visit (INDEPENDENT_AMBULATORY_CARE_PROVIDER_SITE_OTHER): Payer: Medicare Other | Admitting: Nurse Practitioner

## 2018-05-19 ENCOUNTER — Encounter: Payer: Self-pay | Admitting: Nurse Practitioner

## 2018-05-19 VITALS — BP 150/84 | HR 75 | Ht 60.0 in | Wt 175.0 lb

## 2018-05-19 DIAGNOSIS — I259 Chronic ischemic heart disease, unspecified: Secondary | ICD-10-CM

## 2018-05-19 DIAGNOSIS — E7849 Other hyperlipidemia: Secondary | ICD-10-CM

## 2018-05-19 DIAGNOSIS — I5032 Chronic diastolic (congestive) heart failure: Secondary | ICD-10-CM | POA: Diagnosis not present

## 2018-05-19 DIAGNOSIS — I10 Essential (primary) hypertension: Secondary | ICD-10-CM | POA: Diagnosis not present

## 2018-05-19 LAB — HEPATIC FUNCTION PANEL
ALT: 11 IU/L (ref 0–32)
AST: 13 IU/L (ref 0–40)
Albumin: 4.2 g/dL (ref 3.6–4.8)
Alkaline Phosphatase: 97 IU/L (ref 39–117)
Bilirubin Total: 0.3 mg/dL (ref 0.0–1.2)
Bilirubin, Direct: 0.1 mg/dL (ref 0.00–0.40)
Total Protein: 6.9 g/dL (ref 6.0–8.5)

## 2018-05-19 LAB — LIPID PANEL
Chol/HDL Ratio: 5.4 ratio — ABNORMAL HIGH (ref 0.0–4.4)
Cholesterol, Total: 200 mg/dL — ABNORMAL HIGH (ref 100–199)
HDL: 37 mg/dL — ABNORMAL LOW (ref >39)
Triglycerides: 609 mg/dL (ref 0–149)

## 2018-05-19 NOTE — Patient Instructions (Addendum)
We will be checking the following labs today - Lipids and LFTs   Medication Instructions:    Continue with your current medicines.    If you need a refill on your cardiac medications before your next appointment, please call your pharmacy.     Testing/Procedures To Be Arranged:  N/A  Follow-Up:   See me in 6 months    At [facility], you and your health needs are our priority.  As part of our continuing mission to provide you with exceptional heart care, we have created designated Provider Care Teams.  These Care Teams include your primary Cardiologist (physician) and Advanced Practice Providers (APPs -  Physician Assistants and Nurse Practitioners) who all work together to provide you with the care you need, when you need it.  Special Instructions:  . None  Call the [place] office at [phone] if you have any questions, problems or concerns.

## 2018-05-25 ENCOUNTER — Telehealth: Payer: Self-pay | Admitting: *Deleted

## 2018-05-25 DIAGNOSIS — E7849 Other hyperlipidemia: Secondary | ICD-10-CM

## 2018-05-25 NOTE — Telephone Encounter (Signed)
-----   Message from Burtis Junes, NP sent at 05/22/2018  8:03 AM EST ----- Let's repeat Lipids in one month - fasting.

## 2018-05-25 NOTE — Telephone Encounter (Signed)
Pt has been scheduled for repeat Lipids to be done 06/22/18.[name] Pt aware nothing to eat after midnight the night before labs, morning meds with water is fine. Pt verbalized understanding and thanked me for the call.

## 2018-05-26 DIAGNOSIS — H6982 Other specified disorders of Eustachian tube, left ear: Secondary | ICD-10-CM | POA: Diagnosis not present

## 2018-05-26 DIAGNOSIS — H9012 Conductive hearing loss, unilateral, left ear, with unrestricted hearing on the contralateral side: Secondary | ICD-10-CM | POA: Diagnosis not present

## 2018-05-26 NOTE — Progress Notes (Deleted)
[facility] at [facility] [address] [place], [place] [address] [phone]  Date:  05/28/2018   Name:  Destiny Ballard   DOB:  June 15, 1951   MRN:  [id number]  PCP:  Darreld Mclean, MD    Chief Complaint: No chief complaint on file.   History of Present Illness:  Destiny Ballard is a 67 y.o. very pleasant female patient who presents with the following:  Following up today Seen just recently- on 10/28- we made med adjustment and need to check an EKG and sodium level for her today   A1c pending today- will then address with pt and adjust meds if needed She tried trazodone on her own and liked it. Ok to continue, but will need to monitor her sodium and EKG  Discussed signs and sx of serotonin syndrome with her today  Made an appt in about 2 weeks to check her EKG- however she may be able to have this done by cardiology when she sees them in about one week  Her most recent EKG on file from June is reviewed, Qtc was wnl then //////////////////////////// A1c is still high, called her to discus  Metformin 1000 BID, levemir 20 daily Will add SGLT-2 inhibitor- farxiga 5 mg  Then plan to visit in 3-4 months to repeat A1c She states understanding and agreement   Patient Active Problem List   Diagnosis Date Noted  . Lobar pneumonia, unspecified organism ([place]) 11/13/2017  . Abnormal cardiovascular stress test   . Precordial chest pain   . Sepsis ([place]) 09/18/2017  . Chronic diastolic CHF (congestive heart failure) ([place]) 09/18/2017  . Acute respiratory failure with hypoxia ([place]) 09/18/2017  . Left hip pain 10/28/2016  . Chronic rhinitis 08/07/2016  . COPD with acute exacerbation ([place]) 04/25/2016  . Essential hypertension 01/30/2016  . High blood triglycerides   . Hypokalemia 01/27/2016  . Depression with anxiety   . CAD (coronary artery disease)   . HLD (hyperlipidemia)   . Type 2 diabetes mellitus without complication,  without long-term current use of insulin ([place])   . OSA (obstructive sleep apnea) 05/06/2014  . Dyspnea on exertion 09/02/2012  . Chest pain 12/10/2011  . Chronic cough 05/11/2010  . Obesity (BMI 30-39.9)   . GERD 06/28/2008    Past Medical History:  Diagnosis Date  . Anxiety    Prior suicide attempt  . CAD (coronary artery disease)    a) s/p DES to LAD 07/2005 b) Last Myoview low risk 11/2011 showing small fixed apical perfusion defect (prior MI vs attenuation) but no ischemia - normal EF.  [name] Cervical spondylosis   . Coronary atherosclerosis 06/28/2008  . Depression   . Depression with anxiety   . Diabetes mellitus without complication ([place])   . GERD (gastroesophageal reflux disease)   . Hyperlipidemia   . Hypertension   . Insulin resistance   . Iron deficiency anemia   . Obesity     Past Surgical History:  Procedure Laterality Date  . BREAST ENHANCEMENT SURGERY    . CARDIAC CATHETERIZATION  06/17/2007   NORMAL. EF 60%  . CARDIAC CATHETERIZATION N/A 01/29/2016   Procedure: Left Heart Cath and Coronary Angiography;  Surgeon: Sherren Mocha, MD;  Location: [facility];  Service: Cardiovascular;  Laterality: N/A;  . CERVICAL SPONDYLOSIS     SINGLE LEVEL FUSION  . CHILDBIRTH     X3  . CORONARY STENT PLACEMENT  07/2005   LEFT  ANTERIOR DESCENDING  . FOREARM FRACTURE SURGERY  2010   hand and shoulder   . INCISION AND DRAINAGE BREAST ABSCESS  01/05/2012      . INCISION AND DRAINAGE PERIRECTAL ABSCESS N/A 02/18/2014   Procedure: IRRIGATION AND DEBRIDEMENT PERIRECTAL ABSCESS;  Surgeon: Pedro Earls, MD;  [facility];  Service: General;  Laterality: N/A;  . LEFT HEART CATH AND CORONARY ANGIOGRAPHY N/A 10/10/2017   Procedure: LEFT HEART CATH AND CORONARY ANGIOGRAPHY;  Surgeon: Burnell Blanks, MD;  Location: [facility];  Service: Cardiovascular;  Laterality: N/A;  . LUMBAR LAMINECTOMY    . ROTATOR CUFF REPAIR     bilaterla  . TONSILLECTOMY AND  ADENOIDECTOMY    . TUBAL LIGATION    . VIDEO BRONCHOSCOPY Bilateral 08/13/2016   Procedure: VIDEO BRONCHOSCOPY WITHOUT FLUORO;  Surgeon: Collene Gobble, MD;  [facility];  Service: Cardiopulmonary;  Laterality: Bilateral;    Social History   Tobacco Use  . Smoking status: Never Smoker  . Smokeless tobacco: Never Used  Substance Use Topics  . Alcohol use: Yes    Comment: occ  . Drug use: No    Family History  Problem Relation Age of Onset  . Heart attack Mother   . Diabetes Mother   . Lung cancer Mother   . Asthma Mother   . Heart disease Mother   . Suicidality Father        "killed himself"  . Asthma Daughter        x 2  . Cancer Daughter        pre-cancerous polyp  . Diabetes Sister   . Cancer Sister   . Cervical cancer Daughter        cervical   . Allergies Other        all family--seasonal allergies    Allergies  Allergen Reactions  . Prednisone Other (See Comments)    REACTION: mood swings, nightmares. "Shot doesn't bother me, reaction is just with the pill" she states she has had the steroid injections before. From our records methylprednisone was given to her in 2013 without any complications.    Medication list has been reviewed and updated.  Current Outpatient Medications on File Prior to Visit  Medication Sig Dispense Refill  . albuterol (PROAIR HFA) 108 (90 Base) MCG/ACT inhaler Inhale 2 puffs into the lungs every 4 (four) hours as needed for wheezing or shortness of breath. 3 Inhaler 1  . albuterol (PROVENTIL) (2.5 MG/3ML) 0.083% nebulizer solution Take 3 mLs (2.5 mg total) by nebulization every 4 (four) hours as needed for wheezing or shortness of breath. 75 mL 12  . aspirin EC 81 MG tablet Take 81 mg by mouth daily.    . calcium carbonate (TUMS - DOSED IN MG ELEMENTAL CALCIUM) 500 MG chewable tablet Chew 2 tablets by mouth daily as needed for indigestion or heartburn.    . cetirizine (ZYRTEC) 10 MG tablet TAKE 1 TABLET BY MOUTH DAILY AS  NEEDED FOR ALLERGIES 90 tablet 1  . Cholecalciferol (VITAMIN D3) 1000 UNITS CAPS Take 1,000 Units by mouth daily.     . clonazePAM (KLONOPIN) 0.5 MG tablet TAKE 0.5 TABLETS (0.25 MG TOTAL) BY MOUTH 2 TIMES DAILY AS NEEDED FOR ANXIETY 20 tablet 1  . dapagliflozin propanediol (FARXIGA) 5 MG TABS tablet Take 5 mg by mouth daily. 30 tablet 6  . FLUoxetine (PROZAC) 40 MG capsule Take 1 capsule (40 mg total) by mouth daily. 90 capsule 3  . fluticasone (FLONASE)  50 MCG/ACT nasal spray SPRAY 2 SPRAYS INTO EACH NOSTRIL EVERY DAY 48 g 0  . furosemide (LASIX) 20 MG tablet TAKE ONE TABLET DAILY IF NEEDED FOR LEG SWELLING 30 tablet 1  . gabapentin (NEURONTIN) 300 MG capsule Take 600 mg by mouth 2 (two) times daily.     . hydrALAZINE (APRESOLINE) 25 MG tablet Take 2 tablets (50 mg total) by mouth 3 (three) times daily. 180 tablet 11  . hydrOXYzine (ATARAX/VISTARIL) 25 MG tablet TAKE 1 TABLET BY MOUTH EVERYDAY AT BEDTIME 90 tablet 1  . ibuprofen (ADVIL,MOTRIN) 200 MG tablet Take 800 mg by mouth every 6 (six) hours as needed for headache or moderate pain.     . Insulin Detemir (LEVEMIR FLEXTOUCH) 100 UNIT/ML Pen Inject 10 Units into the skin daily. 15 mL 3  . KLOR-CON 10 10 MEQ tablet TAKE 1 TABLET (10 MEQ TOTAL) BY MOUTH DAILY. TAKE ONLY ON DAYS THAT YOU USE LASIX/ FUROSEMIDE 90 tablet 1  . losartan (COZAAR) 100 MG tablet Take 1 tablet (100 mg total) by mouth daily. 90 tablet 3  . lovastatin (MEVACOR) 20 MG tablet Take 1 tablet (20 mg total) by mouth at bedtime. 90 tablet 3  . metFORMIN (GLUCOPHAGE-XR) 500 MG 24 hr tablet TAKE 2 TABLETS BY MOUTH TWICE A DAY 360 tablet 0  . methocarbamol (ROBAXIN) 500 MG tablet Take 1 tablet (500 mg total) by mouth 2 (two) times daily. 20 tablet 0  . metoprolol succinate (TOPROL-XL) 100 MG 24 hr tablet Take 1 tablet (100 mg total) by mouth daily. 90 tablet 3  . montelukast (SINGULAIR) 10 MG tablet Take 1 tablet (10 mg total) by mouth at bedtime. 90 tablet 1  . nitroGLYCERIN  (NITROSTAT) 0.4 MG SL tablet Place 1 tablet (0.4 mg total) under the tongue every 5 (five) minutes as needed for chest pain. 20 tablet 3  . omega-3 acid ethyl esters (LOVAZA) 1 g capsule TAKE 1 CAPSULE (1 G TOTAL) BY MOUTH 2 (TWO) TIMES DAILY. 180 capsule 1  . polyethylene glycol (MIRALAX / GLYCOLAX) packet Take 17 g by mouth daily. 14 each 0  . Polyvinyl Alcohol-Povidone (REFRESH OP) Place 2 drops into both eyes daily as needed (for dry eyes).    . Tiotropium Bromide Monohydrate (SPIRIVA RESPIMAT) 2.5 MCG/ACT AERS Inhale 2 puffs into the lungs daily. 1 Inhaler 11  . traZODone (DESYREL) 50 MG tablet Take 0.5-1 tablets (25-50 mg total) by mouth at bedtime as needed for sleep. 30 tablet 3   No current facility-administered medications on file prior to visit.     Review of Systems:  ***  Physical Examination: There were no vitals filed for this visit. There were no vitals filed for this visit. There is no height or weight on file to calculate BMI. Ideal Body Weight:    ***  Assessment and Plan: ***  Signed Lamar Blinks, MD

## 2018-05-28 ENCOUNTER — Ambulatory Visit: Payer: Medicare Other | Admitting: Family Medicine

## 2018-05-28 DIAGNOSIS — Z0289 Encounter for other administrative examinations: Secondary | ICD-10-CM

## 2018-05-29 ENCOUNTER — Encounter: Payer: Self-pay | Admitting: Family Medicine

## 2018-05-30 ENCOUNTER — Other Ambulatory Visit: Payer: Self-pay | Admitting: Family Medicine

## 2018-05-30 DIAGNOSIS — L299 Pruritus, unspecified: Secondary | ICD-10-CM

## 2018-06-03 ENCOUNTER — Other Ambulatory Visit: Payer: Self-pay | Admitting: Family Medicine

## 2018-06-03 DIAGNOSIS — E785 Hyperlipidemia, unspecified: Secondary | ICD-10-CM

## 2018-06-05 ENCOUNTER — Other Ambulatory Visit: Payer: Self-pay | Admitting: Family Medicine

## 2018-06-05 DIAGNOSIS — F5102 Adjustment insomnia: Secondary | ICD-10-CM

## 2018-06-08 ENCOUNTER — Other Ambulatory Visit: Payer: Self-pay | Admitting: *Deleted

## 2018-06-08 MED ORDER — FLUTICASONE PROPIONATE 50 MCG/ACT NA SUSP: NASAL | 0 refills | Status: DC

## 2018-06-22 ENCOUNTER — Other Ambulatory Visit: Payer: Medicare Other

## 2018-07-10 ENCOUNTER — Ambulatory Visit: Payer: Self-pay

## 2018-07-10 NOTE — Telephone Encounter (Signed)
Pt. Reports she has been feeling sad with the anniversary of her husband's death and has "been doing like I should." Blood sugar this afternoon 330. States she "took a shot." States she has not been checking glucose like she should. States she will do better. Offered to schedule an appointment for follow up. States she will call back. Reports "I'll be alright - I just miss my husband." Instructed to call back and make appointment.  Answer Assessment - Initial Assessment Questions 1. BLOOD GLUCOSE: "What is your blood glucose level?"      330 2. ONSET: "When did you check the blood glucose?"     This afternoon 3. USUAL RANGE: "What is your glucose level usually?" (e.g., usual fasting morning value, usual evening value)     Has been in the 200's 4. KETONES: "Do you check for ketones (urine or blood test strips)?" If yes, ask: "What does the test show now?"      No 5. TYPE 1 or 2:  "Do you know what type of diabetes you have?"  (e.g., Type 1, Type 2, Gestational; doesn't know)      Type 1 6. INSULIN: "Do you take insulin?" "What type of insulin(s) do you use? What is the mode of delivery? (syringe, pen (e.g., injection or  pump)?"      Yes 7. DIABETES PILLS: "Do you take any pills for your diabetes?" If yes, ask: "Have you missed taking any pills recently?"     Yes 8. OTHER SYMPTOMS: "Do you have any symptoms?" (e.g., fever, frequent urination, difficulty breathing, dizziness, weakness, vomiting)     No 9. PREGNANCY: "Is there any chance you are pregnant?" "When was your last menstrual period?"     No  Protocols used: DIABETES - HIGH BLOOD SUGAR-A-AH

## 2018-07-23 ENCOUNTER — Other Ambulatory Visit: Payer: Self-pay | Admitting: *Deleted

## 2018-07-23 NOTE — Patient Outreach (Signed)
[facility]) Care Management  07/23/2018  ALFHILD PARTCH 25-Apr-1951 [id number]   [place] Monthly Outreach  Referral Date:12/25/2017 Referral Source:Transfer from [place] Reason for Referral:Continued Disease Management Education Insurance:Medicare   Outreach Attempt:  Outreach attempt #1 to patient for quarterly follow up.  Female answered and stated patient not available.  Telephone hung up before [place] able to leave message.   Plan:  RN Health Coach will attempt another telephone outreach to patient within the month of January.  [place] Coach [phone] [email or web address]

## 2018-08-03 ENCOUNTER — Ambulatory Visit (INDEPENDENT_AMBULATORY_CARE_PROVIDER_SITE_OTHER): Payer: Medicare Other | Admitting: Family Medicine

## 2018-08-03 ENCOUNTER — Encounter: Payer: Self-pay | Admitting: Family Medicine

## 2018-08-03 VITALS — BP 110/62 | HR 87 | Temp 98.1°F | Resp 20 | Ht 60.0 in | Wt 172.0 lb

## 2018-08-03 DIAGNOSIS — Z794 Long term (current) use of insulin: Secondary | ICD-10-CM | POA: Diagnosis not present

## 2018-08-03 DIAGNOSIS — J441 Chronic obstructive pulmonary disease with (acute) exacerbation: Secondary | ICD-10-CM

## 2018-08-03 DIAGNOSIS — E1165 Type 2 diabetes mellitus with hyperglycemia: Secondary | ICD-10-CM | POA: Diagnosis not present

## 2018-08-03 DIAGNOSIS — IMO0002 Reserved for concepts with insufficient information to code with codable children: Secondary | ICD-10-CM

## 2018-08-03 DIAGNOSIS — E118 Type 2 diabetes mellitus with unspecified complications: Secondary | ICD-10-CM | POA: Diagnosis not present

## 2018-08-03 DIAGNOSIS — R059 Cough, unspecified: Secondary | ICD-10-CM

## 2018-08-03 DIAGNOSIS — R05 Cough: Secondary | ICD-10-CM | POA: Diagnosis not present

## 2018-08-03 LAB — HEMOGLOBIN A1C: Hgb A1c MFr Bld: 9.2 % — ABNORMAL HIGH (ref 4.6–6.5)

## 2018-08-03 MED ORDER — HYDROCODONE-HOMATROPINE 5-1.5 MG/5ML PO SYRP: 2.5000 mL | ORAL_SOLUTION | Freq: Three times a day (TID) | ORAL | 0 refills | Status: AC | PRN

## 2018-08-03 MED ORDER — DOXYCYCLINE HYCLATE 100 MG PO CAPS: 100.0000 mg | ORAL_CAPSULE | Freq: Two times a day (BID) | ORAL | 0 refills | Status: DC

## 2018-08-03 MED ORDER — IPRATROPIUM BROMIDE 0.03 % NA SOLN: 2.0000 | Freq: Four times a day (QID) | NASAL | 6 refills | Status: DC

## 2018-08-03 MED ORDER — DAPAGLIFLOZIN PROPANEDIOL 10 MG PO TABS: 10.0000 mg | ORAL_TABLET | Freq: Every day | ORAL | 3 refills | Status: DC

## 2018-08-03 MED ORDER — ALBUTEROL SULFATE (2.5 MG/3ML) 0.083% IN NEBU
2.5000 mg | INHALATION_SOLUTION | RESPIRATORY_TRACT | 12 refills | Status: DC | PRN
Start: 2018-08-03 — End: 2019-01-04

## 2018-08-03 NOTE — Addendum Note (Signed)
Addended by: Darreld Mclean on: 08/03/2018 06:54 PM   Modules accepted: Orders

## 2018-08-03 NOTE — Patient Instructions (Addendum)
It was good to see you today, but I am sorry you are not feeling well. Let us check your A1c today, I will be in touch this report ASAP. Please start on doxycycline twice a day for 10 days for your chest.  Continue Spiriva, and use albuterol as needed.  I gave you albuterol neb treatments to use in place of the inhaler if you like. For postnasal drainage and throat congestion, try the Atrovent nasal spray.  Please let me know if you are not feeling better in the next few days, sooner if you start getting worse  Use the cough syrup as needed for cough, but remember will make you drowsy.  Do not use it when you need to drive.  Please do not combine it with clonazepam.  Also, if you are using this with trazodone take only a half trazodone

## 2018-08-03 NOTE — Progress Notes (Addendum)
[facility] [address], [place], [place] [address] [phone]  Date:  08/03/2018   Name:  Destiny Ballard   DOB:  10-29-1950   MRN:  [id number]  PCP:  Darreld Mclean, MD    Chief Complaint: URI (cough, shortness of breath, sore throat,chills, sneezing, rhinorrhea)   History of Present Illness:  Destiny Ballard is a 68 y.o. very pleasant female patient who presents with the following: Her husband slammed died just over a year ago, this is been a difficult adjustment for Destiny Ballard Here today for a sick visit I last saw her in October for a recheck visit   She has been sick for 1-2 weeks with cough, cough seems wet to her However the cough is just slightly productive Her eyes are running and she is sneezing No fever noted She has noted aches and chills however The cough has exacerbated her back pain No vomiting Her appetite is not great Some wheezing  She is bothered by postnasal drip  She is using spiriva and her albuterol as needed   Lab Results  Component Value Date   HGBA1C 9.5 (H) 05/11/2018   She is not checking her glucose all the time  metformin 1000 BID farxiga  She has access to a neb machine but does not have any neb medication at this time  No history of glaucoma  Accompanied today by her daughter who contributes to the history  Patient Active Problem List   Diagnosis Date Noted  . Lobar pneumonia, unspecified organism ([place]) 11/13/2017  . Abnormal cardiovascular stress test   . Precordial chest pain   . Sepsis ([place]) 09/18/2017  . Chronic diastolic CHF (congestive heart failure) ([place]) 09/18/2017  . Acute respiratory failure with hypoxia ([place]) 09/18/2017  . Left hip pain 10/28/2016  . Chronic rhinitis 08/07/2016  . COPD with acute exacerbation ([place]) 04/25/2016  . Essential hypertension 01/30/2016  . High blood triglycerides   . Hypokalemia 01/27/2016  . Depression with anxiety    . CAD (coronary artery disease)   . HLD (hyperlipidemia)   . Type 2 diabetes mellitus without complication, without long-term current use of insulin ([place])   . OSA (obstructive sleep apnea) 05/06/2014  . Dyspnea on exertion 09/02/2012  . Chest pain 12/10/2011  . Chronic cough 05/11/2010  . Obesity (BMI 30-39.9)   . GERD 06/28/2008    Past Medical History:  Diagnosis Date  . Anxiety    Prior suicide attempt  . CAD (coronary artery disease)    a) s/p DES to LAD 07/2005 b) Last Myoview low risk 11/2011 showing small fixed apical perfusion defect (prior MI vs attenuation) but no ischemia - normal EF.  [name] Cervical spondylosis   . Coronary atherosclerosis 06/28/2008  . Depression   . Depression with anxiety   . Diabetes mellitus without complication ([place])   . GERD (gastroesophageal reflux disease)   . Hyperlipidemia   . Hypertension   . Insulin resistance   . Iron deficiency anemia   . Obesity     Past Surgical History:  Procedure Laterality Date  . BREAST ENHANCEMENT SURGERY    . CARDIAC CATHETERIZATION  06/17/2007   NORMAL. EF 60%  . CARDIAC CATHETERIZATION N/A 01/29/2016   Procedure: Left Heart Cath and Coronary Angiography;  Surgeon: Sherren Mocha, MD;  Location: [facility];  Service: Cardiovascular;  Laterality: N/A;  . CERVICAL SPONDYLOSIS     SINGLE LEVEL FUSION  .  CHILDBIRTH     X3  . CORONARY STENT PLACEMENT  07/2005   LEFT ANTERIOR DESCENDING  . FOREARM FRACTURE SURGERY  2010   hand and shoulder   . INCISION AND DRAINAGE BREAST ABSCESS  01/05/2012      . INCISION AND DRAINAGE PERIRECTAL ABSCESS N/A 02/18/2014   Procedure: IRRIGATION AND DEBRIDEMENT PERIRECTAL ABSCESS;  Surgeon: Pedro Earls, MD;  Location: [facility];  Service: General;  Laterality: N/A;  . LEFT HEART CATH AND CORONARY ANGIOGRAPHY N/A 10/10/2017   Procedure: LEFT HEART CATH AND CORONARY ANGIOGRAPHY;  Surgeon: Burnell Blanks, MD;  Location: [facility];  Service: Cardiovascular;   Laterality: N/A;  . LUMBAR LAMINECTOMY    . ROTATOR CUFF REPAIR     bilaterla  . TONSILLECTOMY AND ADENOIDECTOMY    . TUBAL LIGATION    . VIDEO BRONCHOSCOPY Bilateral 08/13/2016   Procedure: VIDEO BRONCHOSCOPY WITHOUT FLUORO;  Surgeon: Collene Gobble, MD;  Location: [facility];  Service: Cardiopulmonary;  Laterality: Bilateral;    Social History   Tobacco Use  . Smoking status: Never Smoker  . Smokeless tobacco: Never Used  Substance Use Topics  . Alcohol use: Yes    Comment: occ  . Drug use: No    Family History  Problem Relation Age of Onset  . Heart attack Mother   . Diabetes Mother   . Lung cancer Mother   . Asthma Mother   . Heart disease Mother   . Suicidality Father        "killed himself"  . Asthma Daughter        x 2  . Cancer Daughter        pre-cancerous polyp  . Diabetes Sister   . Cancer Sister   . Cervical cancer Daughter        cervical   . Allergies Other        all family--seasonal allergies    Allergies  Allergen Reactions  . Prednisone Other (See Comments)    REACTION: mood swings, nightmares. "Shot doesn't bother me, reaction is just with the pill" she states she has had the steroid injections before. From our records methylprednisone was given to her in 2013 without any complications.    Medication list has been reviewed and updated.  Current Outpatient Medications on File Prior to Visit  Medication Sig Dispense Refill  . albuterol (PROAIR HFA) 108 (90 Base) MCG/ACT inhaler Inhale 2 puffs into the lungs every 4 (four) hours as needed for wheezing or shortness of breath. 3 Inhaler 1  . aspirin EC 81 MG tablet Take 81 mg by mouth daily.    . calcium carbonate (TUMS - DOSED IN MG ELEMENTAL CALCIUM) 500 MG chewable tablet Chew 2 tablets by mouth daily as needed for indigestion or heartburn.    . cetirizine (ZYRTEC) 10 MG tablet TAKE 1 TABLET BY MOUTH DAILY AS NEEDED FOR ALLERGIES 90 tablet 1  . Cholecalciferol (VITAMIN D3) 1000 UNITS CAPS Take  1,000 Units by mouth daily.     . clonazePAM (KLONOPIN) 0.5 MG tablet TAKE 0.5 TABLETS (0.25 MG TOTAL) BY MOUTH 2 TIMES DAILY AS NEEDED FOR ANXIETY 20 tablet 1  . dapagliflozin propanediol (FARXIGA) 5 MG TABS tablet Take 5 mg by mouth daily. 30 tablet 6  . FLUoxetine (PROZAC) 40 MG capsule Take 1 capsule (40 mg total) by mouth daily. 90 capsule 3  . fluticasone (FLONASE) 50 MCG/ACT nasal spray SPRAY 2 SPRAYS INTO EACH NOSTRIL EVERY DAY 48 g 0  .  furosemide (LASIX) 20 MG tablet TAKE ONE TABLET DAILY IF NEEDED FOR LEG SWELLING 30 tablet 1  . gabapentin (NEURONTIN) 300 MG capsule Take 600 mg by mouth 2 (two) times daily.     . hydrALAZINE (APRESOLINE) 25 MG tablet Take 2 tablets (50 mg total) by mouth 3 (three) times daily. 180 tablet 11  . hydrOXYzine (ATARAX/VISTARIL) 25 MG tablet TAKE 1 TABLET BY MOUTH EVERYDAY AT BEDTIME 90 tablet 1  . ibuprofen (ADVIL,MOTRIN) 200 MG tablet Take 800 mg by mouth every 6 (six) hours as needed for headache or moderate pain.     . Insulin Detemir (LEVEMIR FLEXTOUCH) 100 UNIT/ML Pen Inject 10 Units into the skin daily. 15 mL 3  . KLOR-CON 10 10 MEQ tablet TAKE 1 TABLET (10 MEQ TOTAL) BY MOUTH DAILY. TAKE ONLY ON DAYS THAT YOU USE LASIX/ FUROSEMIDE 90 tablet 1  . lovastatin (MEVACOR) 20 MG tablet TAKE 1 TABLET BY MOUTH EVERYDAY AT BEDTIME 90 tablet 1  . metFORMIN (GLUCOPHAGE-XR) 500 MG 24 hr tablet TAKE 2 TABLETS BY MOUTH TWICE A DAY 360 tablet 0  . methocarbamol (ROBAXIN) 500 MG tablet Take 1 tablet (500 mg total) by mouth 2 (two) times daily. 20 tablet 0  . metoprolol succinate (TOPROL-XL) 100 MG 24 hr tablet Take 1 tablet (100 mg total) by mouth daily. 90 tablet 3  . montelukast (SINGULAIR) 10 MG tablet Take 1 tablet (10 mg total) by mouth at bedtime. 90 tablet 1  . nitroGLYCERIN (NITROSTAT) 0.4 MG SL tablet Place 1 tablet (0.4 mg total) under the tongue every 5 (five) minutes as needed for chest pain. 20 tablet 3  . omega-3 acid ethyl esters (LOVAZA) 1 g capsule  TAKE 1 CAPSULE (1 G TOTAL) BY MOUTH 2 (TWO) TIMES DAILY. 180 capsule 1  . polyethylene glycol (MIRALAX / GLYCOLAX) packet Take 17 g by mouth daily. 14 each 0  . Polyvinyl Alcohol-Povidone (REFRESH OP) Place 2 drops into both eyes daily as needed (for dry eyes).    . Tiotropium Bromide Monohydrate (SPIRIVA RESPIMAT) 2.5 MCG/ACT AERS Inhale 2 puffs into the lungs daily. 1 Inhaler 11  . traZODone (DESYREL) 50 MG tablet TAKE 1/2-1 TABLET BY MOUTH AT BEDTIME AS NEEDED FOR SLEEP 90 tablet 1  . losartan (COZAAR) 100 MG tablet Take 1 tablet (100 mg total) by mouth daily. 90 tablet 3   No current facility-administered medications on file prior to visit.     Review of Systems:  As per HPI- otherwise negative.. No rash, no diarrhea Physical Examination: Vitals:   08/03/18 1132  BP: 110/62  Pulse: 87  Resp: 20  Temp: 98.1 F (36.7 C)  SpO2: 95%   Vitals:   08/03/18 1132  Weight: 172 lb (78 kg)  Height: 5' (1.524 m)   Body mass index is 33.59 kg/m. Ideal Body Weight: Weight in (lb) to have BMI = 25: 127.7  GEN: WDWN, NAD, Non-toxic, A & O x 3, coughing some in room, but otherwise appears her normal self today HEENT: Atraumatic, Normocephalic. Neck supple. No masses, No LAD.  Bilateral TM wnl, oropharynx normal.  PEERL,EOMI. postnasal drainage is apparent Ears and Nose: No external deformity. CV: RRR, No M/G/R. No JVD. No thrill. No extra heart sounds. PULM: CTA B, no wheezes, crackles, rhonchi. No retractions. No resp. distress. No accessory muscle use. ABD: S, NT, ND EXTR: No c/c/e NEURO Normal gait.  PSYCH: Normally interactive. Conversant. Not depressed or anxious appearing.  Calm demeanor.    Assessment and Plan: COPD  exacerbation ([place]) - Plan: doxycycline (VIBRAMYCIN) 100 MG capsule, ipratropium (ATROVENT) 0.03 % nasal spray, albuterol (PROVENTIL) (2.5 MG/3ML) 0.083% nebulizer solution  Uncontrolled type 2 diabetes mellitus with complication, with long-term current use of  insulin ([facility]) - Plan: Hemoglobin A1c  Cough - Plan: HYDROcodone-homatropine (HYCODAN) 5-1.5 MG/5ML syrup  Here today with a mild COPD exacerbation.  Destiny Ballard has poorly controlled diabetes, so will avoid systemic steroids at this time.  Treat with doxycycline, Atrovent nasal spray for postnasal drainage, and refilled her albuterol neb medication. She requests medication use for cough that will help her sleep.  She reports not sleeping well due to cough.  She does take trazodone at bedtime, but states she is rarely using her clonazepam.  Gave prescription for hydrocodone, urged her not to combine with any other sedating medication, and if used with trazodone take 1/2 tablet of trazodone.  We will recheck A1c today, I will be in touch with this report  Signed Lamar Blinks, MD Addendum, received her A1c-it is slightly better She is on farxiga 5, I will increase to 10 Have called patient with this change  Results for orders placed or performed in visit on 08/03/18  Hemoglobin A1c  Result Value Ref Range   Hgb A1c MFr Bld 9.2 (H) 4.6 - 6.5 %

## 2018-08-11 ENCOUNTER — Other Ambulatory Visit: Payer: Self-pay | Admitting: *Deleted

## 2018-08-11 NOTE — Patient Outreach (Signed)
[place] [facility] [facility]) Care Management  08/11/2018  REHEMA MUFFLEY [birth year] [id number]   [facility] Monthly Outreach  Referral Date:12/25/2017 Referral Source:Transfer from [place] Reason for Referral:Continued Disease Management Education Insurance:Medicare   Outreach Attempt:  Outreach attempt #2 to patient for follow up.  Patient answered and stated she was getting over a cold.  States she is not able to talk right now and requested a call back.  Plan:  RN Health Coach will make another outreach attempt within the month of February.  [place] [phone] [email or web address]

## 2018-08-20 ENCOUNTER — Other Ambulatory Visit: Payer: Self-pay | Admitting: *Deleted

## 2018-08-20 NOTE — Patient Outreach (Signed)
[facility]) Care Management  08/20/2018  Destiny Ballard 1951/06/13 [id number]   RN Health Coach Quarterly Outreach  Referral Date:12/25/2017 Referral Source:Transfer from [place] Reason for Referral:Continued Disease Management Education Insurance:Medicare   Outreach Attempt:  Outreach attempt #3 to patient for quarterly follow up. No answer. RN Health Coach left HIPAA compliant voicemail message along with contact information.  Plan:  RN Health Coach will make another outreach attempt within the month of March if no return call back from patient.  [place] [phone] [name]

## 2018-08-22 ENCOUNTER — Emergency Department (HOSPITAL_BASED_OUTPATIENT_CLINIC_OR_DEPARTMENT_OTHER)
Admission: EM | Admit: 2018-08-22 | Discharge: 2018-08-23 | Disposition: A | Discharge status: Home / Self Care | Payer: Medicare Other | Attending: Emergency Medicine | Admitting: Emergency Medicine

## 2018-08-22 ENCOUNTER — Emergency Department (HOSPITAL_BASED_OUTPATIENT_CLINIC_OR_DEPARTMENT_OTHER): Payer: Medicare Other

## 2018-08-22 ENCOUNTER — Other Ambulatory Visit: Payer: Self-pay

## 2018-08-22 ENCOUNTER — Encounter (HOSPITAL_BASED_OUTPATIENT_CLINIC_OR_DEPARTMENT_OTHER): Payer: Self-pay | Admitting: *Deleted

## 2018-08-22 DIAGNOSIS — N200 Calculus of kidney: Secondary | ICD-10-CM | POA: Diagnosis not present

## 2018-08-22 DIAGNOSIS — J449 Chronic obstructive pulmonary disease, unspecified: Secondary | ICD-10-CM | POA: Insufficient documentation

## 2018-08-22 DIAGNOSIS — Z79899 Other long term (current) drug therapy: Secondary | ICD-10-CM | POA: Diagnosis not present

## 2018-08-22 DIAGNOSIS — R0789 Other chest pain: Secondary | ICD-10-CM | POA: Diagnosis not present

## 2018-08-22 DIAGNOSIS — Z955 Presence of coronary angioplasty implant and graft: Secondary | ICD-10-CM | POA: Diagnosis not present

## 2018-08-22 DIAGNOSIS — R05 Cough: Secondary | ICD-10-CM | POA: Insufficient documentation

## 2018-08-22 DIAGNOSIS — Z794 Long term (current) use of insulin: Secondary | ICD-10-CM | POA: Diagnosis not present

## 2018-08-22 DIAGNOSIS — R197 Diarrhea, unspecified: Secondary | ICD-10-CM | POA: Diagnosis not present

## 2018-08-22 DIAGNOSIS — E119 Type 2 diabetes mellitus without complications: Secondary | ICD-10-CM | POA: Diagnosis not present

## 2018-08-22 DIAGNOSIS — R079 Chest pain, unspecified: Secondary | ICD-10-CM | POA: Diagnosis not present

## 2018-08-22 DIAGNOSIS — R110 Nausea: Secondary | ICD-10-CM

## 2018-08-22 DIAGNOSIS — N132 Hydronephrosis with renal and ureteral calculous obstruction: Secondary | ICD-10-CM | POA: Diagnosis not present

## 2018-08-22 DIAGNOSIS — Z7982 Long term (current) use of aspirin: Secondary | ICD-10-CM | POA: Insufficient documentation

## 2018-08-22 DIAGNOSIS — I110 Hypertensive heart disease with heart failure: Secondary | ICD-10-CM | POA: Diagnosis not present

## 2018-08-22 DIAGNOSIS — Z7722 Contact with and (suspected) exposure to environmental tobacco smoke (acute) (chronic): Secondary | ICD-10-CM | POA: Diagnosis not present

## 2018-08-22 DIAGNOSIS — I2510 Atherosclerotic heart disease of native coronary artery without angina pectoris: Secondary | ICD-10-CM | POA: Diagnosis not present

## 2018-08-22 DIAGNOSIS — R1013 Epigastric pain: Secondary | ICD-10-CM | POA: Diagnosis not present

## 2018-08-22 DIAGNOSIS — I5032 Chronic diastolic (congestive) heart failure: Secondary | ICD-10-CM | POA: Diagnosis not present

## 2018-08-22 LAB — BASIC METABOLIC PANEL
Anion gap: 13 (ref 5–15)
BUN: 20 mg/dL (ref 8–23)
CO2: 24 mmol/L (ref 22–32)
Calcium: 9.5 mg/dL (ref 8.9–10.3)
Chloride: 99 mmol/L (ref 98–111)
Creatinine, Ser: 1.03 mg/dL — ABNORMAL HIGH (ref 0.44–1.00)
GFR calc Af Amer: >60 mL/min (ref >60)
GFR calc non Af Amer: 55 mL/min — ABNORMAL LOW (ref >60)
Glucose, Bld: 273 mg/dL — ABNORMAL HIGH (ref 70–99)
Potassium: 3.6 mmol/L (ref 3.5–5.1)
Sodium: 136 mmol/L (ref 135–145)

## 2018-08-22 LAB — HEPATIC FUNCTION PANEL
ALT: 18 U/L (ref 0–44)
AST: 21 U/L (ref 15–41)
Albumin: 3.7 g/dL (ref 3.5–5.0)
Alkaline Phosphatase: 96 U/L (ref 38–126)
Bilirubin, Direct: <0.1 mg/dL (ref 0.0–0.2)
Total Bilirubin: 0.3 mg/dL (ref 0.3–1.2)
Total Protein: 8 g/dL (ref 6.5–8.1)

## 2018-08-22 LAB — CBC
HCT: 38.9 % (ref 36.0–46.0)
Hemoglobin: 11.9 g/dL — ABNORMAL LOW (ref 12.0–15.0)
MCH: 24.5 pg — ABNORMAL LOW (ref 26.0–34.0)
MCHC: 30.6 g/dL (ref 30.0–36.0)
MCV: 80 fL (ref 80.0–100.0)
Platelets: 334 10*3/uL (ref 150–400)
RBC: 4.86 MIL/uL (ref 3.87–5.11)
RDW: 13.2 % (ref 11.5–15.5)
WBC: 12.4 10*3/uL — ABNORMAL HIGH (ref 4.0–10.5)
nRBC: 0 % (ref 0.0–0.2)

## 2018-08-22 LAB — TROPONIN I: Troponin I: <0.03 ng/mL (ref <0.03)

## 2018-08-22 LAB — MAGNESIUM: Magnesium: 1.9 mg/dL (ref 1.7–2.4)

## 2018-08-22 MED ORDER — ALUM & MAG HYDROXIDE-SIMETH 200-200-20 MG/5ML PO SUSP
15.0000 mL | Freq: Once | ORAL | Status: AC
  Administered 2018-08-22: 15 mL via ORAL
  Filled 2018-08-22: qty 30

## 2018-08-22 MED ORDER — LOPERAMIDE HCL 2 MG PO CAPS
4.0000 mg | ORAL_CAPSULE | Freq: Once | ORAL | Status: AC
  Administered 2018-08-22: 4 mg via ORAL
  Filled 2018-08-22: qty 2

## 2018-08-22 MED ORDER — ONDANSETRON 4 MG PO TBDP: ORAL_TABLET | ORAL | 0 refills | Status: DC

## 2018-08-22 MED ORDER — SODIUM CHLORIDE 0.9% FLUSH
3.0000 mL | Freq: Once | INTRAVENOUS | Status: DC
  Filled 2018-08-22: qty 3

## 2018-08-22 MED ORDER — SODIUM CHLORIDE 0.9 % IV BOLUS
1000.0000 mL | Freq: Once | INTRAVENOUS | Status: AC
  Administered 2018-08-22: 1000 mL via INTRAVENOUS

## 2018-08-22 NOTE — ED Notes (Signed)
ED Provider at bedside. 

## 2018-08-22 NOTE — ED Notes (Signed)
Called lab to add on mag and hepatic function

## 2018-08-22 NOTE — ED Provider Notes (Signed)
[facility] Provider Note   CSN: [id number] Arrival date & time: 08/22/18  2220     History   Chief Complaint Chief Complaint  Patient presents with  . Chest Pain    HPI Destiny Ballard is a 68 y.o. female.  68 yo F with a chief complaint of nausea and diarrhea.  This been going on for about 4 to 5 days.  Diarrhea has been significant to the point where she has been unable to leave her house.  She denies fevers or chills.  She came to the ED this evening because about an hour ago she had sharp pain to her epigastrium that radiated down her left arm.  This is now resolved.  She denies shortness of breath with this denies nausea or vomiting.  Has had a mild cough but this is chronic for her and unchanged.  Has a history of a stent to the LAD but never had chest pain or shortness of breath with this.  The history is provided by the patient.  Chest Pain  Pain location:  Epigastric Pain quality: sharp and shooting   Pain radiates to:  L arm Pain severity:  Moderate Onset quality:  Sudden Duration:  1 hour Timing:  Constant Progression:  Unchanged Chronicity:  New Relieved by:  Nothing Worsened by:  Nothing Ineffective treatments:  None tried Associated symptoms: abdominal pain and nausea   Associated symptoms: no dizziness, no fever, no headache, no palpitations, no shortness of breath and no vomiting     Past Medical History:  Diagnosis Date  . Anxiety    Prior suicide attempt  . CAD (coronary artery disease)    a) s/p DES to LAD 07/2005 b) Last Myoview low risk 11/2011 showing small fixed apical perfusion defect (prior MI vs attenuation) but no ischemia - normal EF.  [name] Cervical spondylosis   . Coronary atherosclerosis 06/28/2008  . Depression   . Depression with anxiety   . Diabetes mellitus without complication ([place])   . GERD (gastroesophageal reflux disease)   . Hyperlipidemia   . Hypertension   . Insulin resistance   . Iron deficiency  anemia   . Obesity     Patient Active Problem List   Diagnosis Date Noted  . Lobar pneumonia, unspecified organism ([place]) 11/13/2017  . Abnormal cardiovascular stress test   . Precordial chest pain   . Sepsis ([place]) 09/18/2017  . Chronic diastolic CHF (congestive heart failure) ([place]) 09/18/2017  . Acute respiratory failure with hypoxia ([place]) 09/18/2017  . Left hip pain 10/28/2016  . Chronic rhinitis 08/07/2016  . COPD with acute exacerbation ([place]) 04/25/2016  . Essential hypertension 01/30/2016  . High blood triglycerides   . Hypokalemia 01/27/2016  . Depression with anxiety   . CAD (coronary artery disease)   . HLD (hyperlipidemia)   . Type 2 diabetes mellitus without complication, without long-term current use of insulin ([place])   . OSA (obstructive sleep apnea) 05/06/2014  . Dyspnea on exertion 09/02/2012  . Chest pain 12/10/2011  . Chronic cough 05/11/2010  . Obesity (BMI 30-39.9)   . GERD 06/28/2008    Past Surgical History:  Procedure Laterality Date  . BREAST ENHANCEMENT SURGERY    . CARDIAC CATHETERIZATION  06/17/2007   NORMAL. EF 60%  . CARDIAC CATHETERIZATION N/A 01/29/2016   Procedure: Left Heart Cath and Coronary Angiography;  Surgeon: Sherren Mocha, MD;  Location: [facility];  Service: Cardiovascular;  Laterality: N/A;  . CERVICAL SPONDYLOSIS  SINGLE LEVEL FUSION  . CHILDBIRTH     X3  . CORONARY STENT PLACEMENT  07/2005   LEFT ANTERIOR DESCENDING  . FOREARM FRACTURE SURGERY  2010   hand and shoulder   . INCISION AND DRAINAGE BREAST ABSCESS  01/05/2012      . INCISION AND DRAINAGE PERIRECTAL ABSCESS N/A 02/18/2014   Procedure: IRRIGATION AND DEBRIDEMENT PERIRECTAL ABSCESS;  Surgeon: Pedro Earls, MD;  Location: [facility];  Service: General;  Laterality: N/A;  . LEFT HEART CATH AND CORONARY ANGIOGRAPHY N/A 10/10/2017   Procedure: LEFT HEART CATH AND CORONARY ANGIOGRAPHY;  Surgeon: Burnell Blanks, MD;  Location: [facility];  Service:  Cardiovascular;  Laterality: N/A;  . LUMBAR LAMINECTOMY    . ROTATOR CUFF REPAIR     bilaterla  . TONSILLECTOMY AND ADENOIDECTOMY    . TUBAL LIGATION    . VIDEO BRONCHOSCOPY Bilateral 08/13/2016   Procedure: VIDEO BRONCHOSCOPY WITHOUT FLUORO;  Surgeon: Collene Gobble, MD;  Location: [facility];  Service: Cardiopulmonary;  Laterality: Bilateral;     OB History   No obstetric history on file.      Home Medications    Prior to Admission medications   Medication Sig Start Date End Date Taking? Authorizing Provider  albuterol (PROAIR HFA) 108 (90 Base) MCG/ACT inhaler Inhale 2 puffs into the lungs every 4 (four) hours as needed for wheezing or shortness of breath. 09/15/17  Yes Collene Gobble, MD  aspirin EC 81 MG tablet Take 81 mg by mouth daily.   Yes [provider]  calcium carbonate (TUMS - DOSED IN MG ELEMENTAL CALCIUM) 500 MG chewable tablet Chew 2 tablets by mouth daily as needed for indigestion or heartburn.   Yes [provider]  cetirizine (ZYRTEC) 10 MG tablet TAKE 1 TABLET BY MOUTH DAILY AS NEEDED FOR ALLERGIES 12/16/17  Yes Copland, Gay Filler, MD  Cholecalciferol (VITAMIN D3) 1000 UNITS CAPS Take 1,000 Units by mouth daily.    Yes [provider]  clonazePAM (KLONOPIN) 0.5 MG tablet TAKE 0.5 TABLETS (0.25 MG TOTAL) BY MOUTH 2 TIMES DAILY AS NEEDED FOR ANXIETY 01/05/18  Yes Copland, Gay Filler, MD  dapagliflozin propanediol (FARXIGA) 10 MG TABS tablet Take 10 mg by mouth daily. 08/03/18  Yes Copland, Gay Filler, MD  FLUoxetine (PROZAC) 40 MG capsule Take 1 capsule (40 mg total) by mouth daily. 01/05/18  Yes Copland, Gay Filler, MD  fluticasone (FLONASE) 50 MCG/ACT nasal spray SPRAY 2 SPRAYS INTO EACH NOSTRIL EVERY DAY 06/08/18  Yes Byrum, Rose Fillers, MD  hydrALAZINE (APRESOLINE) 25 MG tablet Take 2 tablets (50 mg total) by mouth 3 (three) times daily. 10/08/17  Yes Burtis Junes, NP  hydrOXYzine (ATARAX/VISTARIL) 25 MG tablet TAKE 1 TABLET BY MOUTH EVERYDAY AT  BEDTIME 06/01/18  Yes Copland, Gay Filler, MD  ibuprofen (ADVIL,MOTRIN) 200 MG tablet Take 800 mg by mouth every 6 (six) hours as needed for headache or moderate pain.    Yes [provider]  Insulin Detemir (LEVEMIR FLEXTOUCH) 100 UNIT/ML Pen Inject 10 Units into the skin daily. 01/30/16  Yes Barton Dubois, MD  ipratropium (ATROVENT) 0.03 % nasal spray Place 2 sprays into the nose 4 (four) times daily. 08/03/18  Yes Copland, Gay Filler, MD  losartan (COZAAR) 100 MG tablet Take 1 tablet (100 mg total) by mouth daily. 09/10/17 08/22/18 Yes Burtis Junes, NP  metFORMIN (GLUCOPHAGE-XR) 500 MG 24 hr tablet TAKE 2 TABLETS BY MOUTH TWICE A DAY 10/21/17  Yes Renato Shin,  MD  methocarbamol (ROBAXIN) 500 MG tablet Take 1 tablet (500 mg total) by mouth 2 (two) times daily. 02/19/18  Yes Khatri, Hina, PA-C  metoprolol succinate (TOPROL-XL) 100 MG 24 hr tablet Take 1 tablet (100 mg total) by mouth daily. 09/01/17  Yes Copland, Gay Filler, MD  montelukast (SINGULAIR) 10 MG tablet Take 1 tablet (10 mg total) by mouth at bedtime. 11/13/17  Yes Magdalen Spatz, NP  nitroGLYCERIN (NITROSTAT) 0.4 MG SL tablet Place 1 tablet (0.4 mg total) under the tongue every 5 (five) minutes as needed for chest pain. 09/01/17  Yes Copland, Gay Filler, MD  omega-3 acid ethyl esters (LOVAZA) 1 g capsule TAKE 1 CAPSULE (1 G TOTAL) BY MOUTH 2 (TWO) TIMES DAILY. 05/14/18  Yes Burtis Junes, NP  Tiotropium Bromide Monohydrate (SPIRIVA RESPIMAT) 2.5 MCG/ACT AERS Inhale 2 puffs into the lungs daily. 02/25/18  Yes Copland, Gay Filler, MD  traZODone (DESYREL) 50 MG tablet TAKE 1/2-1 TABLET BY MOUTH AT BEDTIME AS NEEDED FOR SLEEP 06/05/18  Yes Copland, Gay Filler, MD  albuterol (PROVENTIL) (2.5 MG/3ML) 0.083% nebulizer solution Take 3 mLs (2.5 mg total) by nebulization every 4 (four) hours as needed for wheezing or shortness of breath. 08/03/18   Copland, Gay Filler, MD  doxycycline (VIBRAMYCIN) 100 MG capsule Take 1 capsule (100 mg total) by mouth  2 (two) times daily. 08/03/18   Copland, Gay Filler, MD  furosemide (LASIX) 20 MG tablet TAKE ONE TABLET DAILY IF NEEDED FOR LEG SWELLING 08/25/17   Copland, Gay Filler, MD  gabapentin (NEURONTIN) 300 MG capsule Take 600 mg by mouth 2 (two) times daily.     [provider]  KLOR-CON 10 10 MEQ tablet TAKE 1 TABLET (10 MEQ TOTAL) BY MOUTH DAILY. TAKE ONLY ON DAYS THAT YOU USE LASIX/ FUROSEMIDE 04/01/18   Copland, Gay Filler, MD  lovastatin (MEVACOR) 20 MG tablet TAKE 1 TABLET BY MOUTH EVERYDAY AT BEDTIME 06/03/18   Copland, Gay Filler, MD  ondansetron (ZOFRAN ODT) 4 MG disintegrating tablet 4mg  ODT q4 hours prn nausea/vomit 08/22/18   Deno Etienne, DO  polyethylene glycol (MIRALAX / GLYCOLAX) packet Take 17 g by mouth daily. 02/19/18   Khatri, Hina, PA-C  Polyvinyl Alcohol-Povidone (REFRESH OP) Place 2 drops into both eyes daily as needed (for dry eyes).    [provider]    Family History Family History  Problem Relation Age of Onset  . Heart attack Mother   . Diabetes Mother   . Lung cancer Mother   . Asthma Mother   . Heart disease Mother   . Suicidality Father        "killed himself"  . Asthma Daughter        x 2  . Cancer Daughter        pre-cancerous polyp  . Diabetes Sister   . Cancer Sister   . Cervical cancer Daughter        cervical   . Allergies Other        all family--seasonal allergies    Social History Social History   Tobacco Use  . Smoking status: Passive Smoke Exposure - Never Smoker  . Smokeless tobacco: Never Used  Substance Use Topics  . Alcohol use: Yes    Comment: occ  . Drug use: No     Allergies   Prednisone   Review of Systems Review of Systems  Constitutional: Negative for chills and fever.  HENT: Negative for congestion and rhinorrhea.   Eyes: Negative for redness and  visual disturbance.  Respiratory: Negative for shortness of breath and wheezing.   Cardiovascular: Positive for chest pain. Negative for palpitations.    Gastrointestinal: Positive for abdominal pain, diarrhea and nausea. Negative for vomiting.  Genitourinary: Negative for dysuria and urgency.  Musculoskeletal: Negative for arthralgias and myalgias.  Skin: Negative for pallor and wound.  Neurological: Negative for dizziness and headaches.     Physical Exam Updated Vital Signs BP (!) 167/67   Pulse 97   Temp 98.3 F (36.8 C) (Oral)   Resp 18   Ht 5' (1.524 m)   Wt 78 kg   SpO2 95%   BMI 33.59 kg/m   Physical Exam Vitals signs and nursing note reviewed.  Constitutional:      General: She is not in acute distress.    Appearance: She is well-developed. She is not diaphoretic.  HENT:     Head: Normocephalic and atraumatic.  Eyes:     Pupils: Pupils are equal, round, and reactive to light.  Neck:     Musculoskeletal: Normal range of motion and neck supple.  Cardiovascular:     Rate and Rhythm: Normal rate and regular rhythm.     Heart sounds: No murmur. No friction rub. No gallop.   Pulmonary:     Effort: Pulmonary effort is normal.     Breath sounds: No wheezing or rales.  Abdominal:     General: There is no distension.     Palpations: Abdomen is soft.     Tenderness: There is no abdominal tenderness.  Musculoskeletal:        General: No tenderness.  Skin:    General: Skin is warm and dry.  Neurological:     Mental Status: She is alert and oriented to person, place, and time.  Psychiatric:        Behavior: Behavior normal.      ED Treatments / Results  Labs (all labs ordered are listed, but only abnormal results are displayed) Labs Reviewed  BASIC METABOLIC PANEL - Abnormal; Notable for the following components:      Result Value   Glucose, Bld 273 (*)    Creatinine, Ser 1.03 (*)    GFR calc non Af Amer 55 (*)    All other components within normal limits  CBC - Abnormal; Notable for the following components:   WBC 12.4 (*)    Hemoglobin 11.9 (*)    MCH 24.5 (*)    All other components within normal  limits  TROPONIN I  HEPATIC FUNCTION PANEL  MAGNESIUM  TROPONIN I    EKG None  Radiology Dg Chest 2 View  Result Date: 08/22/2018 CLINICAL DATA:  Chest pain EXAM: CHEST - 2 VIEW COMPARISON:  02/19/2018 chest radiograph. FINDINGS: Stable cardiomediastinal silhouette with normal heart size. No pneumothorax. No pleural effusion. Lungs appear clear, with no acute consolidative airspace disease and no pulmonary edema. Partially visualized surgical hardware in the lower cervical spine from ACDF. Right breast prosthesis. IMPRESSION: No active cardiopulmonary disease. Electronically Signed   By: Ilona Sorrel M.D.   On: 08/22/2018 23:34    Procedures Procedures (including critical care time)  Medications Ordered in ED Medications  sodium chloride flush (NS) 0.9 % injection 3 mL (3 mLs Intravenous Not Given 08/22/18 2239)  sodium chloride 0.9 % bolus 1,000 mL (1,000 mLs Intravenous New Bag/Given 08/22/18 2306)  alum & mag hydroxide-simeth (MAALOX/MYLANTA) 200-200-20 MG/5ML suspension 15 mL (15 mLs Oral Given 08/22/18 2304)  loperamide (IMODIUM) capsule 4 mg (4 mg Oral  Given 08/22/18 2305)     Initial Impression / Assessment and Plan / ED Course  I have reviewed the triage vital signs and the nursing notes.  Pertinent labs & imaging results that were available during my care of the patient were reviewed by me and considered in my medical decision making (see chart for details).     68 yo F with a cc of nausea and diarrhea.  Had transient chest pain. Atypical in nature though has a hx of LAD stent.  Will obtain trops, cbc cmp lipase.   Trop negative, will delta.    Patient care turned over to Dr. Ellender Hose, please see his note for further details care in the ED.  The patients results and plan were reviewed and discussed.   Any x-rays performed were independently reviewed by myself.   Differential diagnosis were considered with the presenting HPI.  Medications  sodium chloride flush (NS) 0.9 %  injection 3 mL (3 mLs Intravenous Not Given 08/22/18 2239)  sodium chloride 0.9 % bolus 1,000 mL (1,000 mLs Intravenous New Bag/Given 08/22/18 2306)  alum & mag hydroxide-simeth (MAALOX/MYLANTA) 200-200-20 MG/5ML suspension 15 mL (15 mLs Oral Given 08/22/18 2304)  loperamide (IMODIUM) capsule 4 mg (4 mg Oral Given 08/22/18 2305)    Vitals:   08/22/18 2230 08/22/18 2231 08/22/18 2233 08/22/18 2337  BP:   (!) 199/70 (!) 167/67  Pulse:   (!) 106 97  Resp:   15 18  Temp: 98.3 F (36.8 C)     TempSrc: Oral     SpO2:   92% 95%  Weight:  78 kg    Height:  5' (1.524 m)      Final diagnoses:  Nonspecific chest pain  Nausea  Diarrhea in adult patient        Final Clinical Impressions(s) / ED Diagnoses   Final diagnoses:  Nonspecific chest pain  Nausea  Diarrhea in adult patient    ED Discharge Orders         Ordered    ondansetron (ZOFRAN ODT) 4 MG disintegrating tablet     08/22/18 2353           Deno Etienne, DO 08/22/18 2354

## 2018-08-22 NOTE — ED Triage Notes (Signed)
Pt reports indigestion for several days and diarrhea. Tonight she had chest pain that radiated to her left arm. She has a cardiac stent

## 2018-08-23 ENCOUNTER — Emergency Department (HOSPITAL_BASED_OUTPATIENT_CLINIC_OR_DEPARTMENT_OTHER): Payer: Medicare Other

## 2018-08-23 DIAGNOSIS — N132 Hydronephrosis with renal and ureteral calculous obstruction: Secondary | ICD-10-CM | POA: Diagnosis not present

## 2018-08-23 DIAGNOSIS — N200 Calculus of kidney: Secondary | ICD-10-CM | POA: Diagnosis not present

## 2018-08-23 LAB — URINALYSIS, ROUTINE W REFLEX MICROSCOPIC
Bilirubin Urine: NEGATIVE
Glucose, UA: >=500 mg/dL — AB
Hgb urine dipstick: NEGATIVE
Ketones, ur: NEGATIVE mg/dL
Leukocytes, UA: NEGATIVE
Nitrite: NEGATIVE
Protein, ur: NEGATIVE mg/dL
Specific Gravity, Urine: 1.02 (ref 1.005–1.030)
pH: 5.5 (ref 5.0–8.0)

## 2018-08-23 LAB — URINALYSIS, MICROSCOPIC (REFLEX)

## 2018-08-23 LAB — TROPONIN I: Troponin I: <0.03 ng/mL (ref <0.03)

## 2018-08-23 MED ORDER — HYDROCODONE-ACETAMINOPHEN 5-325 MG PO TABS: 1.0000 | ORAL_TABLET | Freq: Four times a day (QID) | ORAL | 0 refills | Status: DC | PRN

## 2018-08-23 MED ORDER — ONDANSETRON HCL 4 MG/2ML IJ SOLN
4.0000 mg | Freq: Once | INTRAMUSCULAR | Status: AC
  Administered 2018-08-23: 4 mg via INTRAVENOUS
  Filled 2018-08-23: qty 2

## 2018-08-23 MED ORDER — MORPHINE SULFATE (PF) 4 MG/ML IV SOLN
4.0000 mg | Freq: Once | INTRAVENOUS | Status: AC
  Administered 2018-08-23: 4 mg via INTRAVENOUS
  Filled 2018-08-23: qty 1

## 2018-08-23 MED ORDER — IOPAMIDOL (ISOVUE-300) INJECTION 61%
100.0000 mL | Freq: Once | INTRAVENOUS | Status: AC | PRN
  Administered 2018-08-23: 100 mL via INTRAVENOUS

## 2018-08-23 MED ORDER — TAMSULOSIN HCL 0.4 MG PO CAPS: 0.4000 mg | ORAL_CAPSULE | Freq: Every day | ORAL | 0 refills | Status: DC

## 2018-08-23 MED ORDER — KETOROLAC TROMETHAMINE 15 MG/ML IJ SOLN
15.0000 mg | Freq: Once | INTRAMUSCULAR | Status: AC
  Administered 2018-08-23: 15 mg via INTRAVENOUS
  Filled 2018-08-23: qty 1

## 2018-08-23 MED ORDER — DICYCLOMINE HCL 10 MG PO CAPS
20.0000 mg | ORAL_CAPSULE | Freq: Once | ORAL | Status: AC
  Administered 2018-08-23: 20 mg via ORAL
  Filled 2018-08-23: qty 2

## 2018-08-23 MED ORDER — ONDANSETRON 4 MG PO TBDP: 4.0000 mg | ORAL_TABLET | Freq: Three times a day (TID) | ORAL | 0 refills | Status: DC | PRN

## 2018-08-23 MED ORDER — ONDANSETRON 4 MG PO TBDP: ORAL_TABLET | ORAL | 0 refills | Status: DC

## 2018-08-23 MED ORDER — TAMSULOSIN HCL 0.4 MG PO CAPS: 0.4000 mg | ORAL_CAPSULE | Freq: Every day | ORAL | 0 refills | Status: AC

## 2018-08-23 NOTE — ED Notes (Signed)
ED Provider at bedside. 

## 2018-08-23 NOTE — ED Notes (Signed)
Pt reports nausea, passes PO challenge x2. No vomiting during ED visit at this time, will continue to monitor.

## 2018-08-24 ENCOUNTER — Telehealth: Payer: Self-pay | Admitting: Family Medicine

## 2018-08-24 ENCOUNTER — Encounter: Payer: Self-pay | Admitting: *Deleted

## 2018-08-24 DIAGNOSIS — R319 Hematuria, unspecified: Secondary | ICD-10-CM | POA: Diagnosis not present

## 2018-08-24 DIAGNOSIS — F418 Other specified anxiety disorders: Secondary | ICD-10-CM | POA: Diagnosis present

## 2018-08-24 DIAGNOSIS — N201 Calculus of ureter: Secondary | ICD-10-CM | POA: Diagnosis not present

## 2018-08-24 DIAGNOSIS — N390 Urinary tract infection, site not specified: Secondary | ICD-10-CM | POA: Diagnosis not present

## 2018-08-24 DIAGNOSIS — R1032 Left lower quadrant pain: Secondary | ICD-10-CM | POA: Diagnosis not present

## 2018-08-24 DIAGNOSIS — E669 Obesity, unspecified: Secondary | ICD-10-CM

## 2018-08-24 DIAGNOSIS — E781 Pure hyperglyceridemia: Secondary | ICD-10-CM | POA: Insufficient documentation

## 2018-08-24 DIAGNOSIS — R102 Pelvic and perineal pain: Secondary | ICD-10-CM | POA: Diagnosis not present

## 2018-08-24 DIAGNOSIS — R9439 Abnormal result of other cardiovascular function study: Secondary | ICD-10-CM

## 2018-08-24 DIAGNOSIS — I2510 Atherosclerotic heart disease of native coronary artery without angina pectoris: Secondary | ICD-10-CM | POA: Diagnosis present

## 2018-08-24 HISTORY — DX: Obesity, unspecified: E66.9

## 2018-08-24 NOTE — Telephone Encounter (Signed)
This encounter was created in error - please disregard.

## 2018-08-24 NOTE — Telephone Encounter (Signed)
Pt called to schedule  F/U appt with Dr. Lorelei Pont. Seen in ED 08/22/2018, "Kidney stone." States was told she would pass it "And to see PCP." Was also advised to make appt with Urologist. Pt reports symptoms remain since ED visit.. nausea, pain with urination. Next available appt made with Dr. Lorelei Pont 08/26/2018. Instructed pt to go to ED if symptoms worsen.

## 2018-08-25 DIAGNOSIS — E119 Type 2 diabetes mellitus without complications: Secondary | ICD-10-CM | POA: Diagnosis not present

## 2018-08-25 DIAGNOSIS — N201 Calculus of ureter: Secondary | ICD-10-CM | POA: Diagnosis not present

## 2018-08-25 DIAGNOSIS — Z01810 Encounter for preprocedural cardiovascular examination: Secondary | ICD-10-CM | POA: Diagnosis not present

## 2018-08-25 NOTE — Progress Notes (Deleted)
[place] at [facility] [address], [place], [place] [address] [phone]  Date:  08/26/2018   Name:  Destiny Ballard   DOB:  09/15/1950   MRN:  [id number]  PCP:  Darreld Mclean, MD    Chief Complaint: No chief complaint on file.   History of Present Illness:  Destiny Ballard is a 68 y.o. very pleasant female patient who presents with the following:  Following up from the emergency room today History of CAD status post stent in 2007, hypertension, diastolic CHF, COPD, OSA, diabetes, hyperlipidemia  She was seen in the ER on February 8 with complaint of nausea and diarrhea-she also had transient chest pain, with negative evaluation She had a CT scan which showed a left-sided kidney stone with moderate hydronephrosis  Otherwise I had last seen her on January 20 with a COPD exacerbation We increased her Farxiga from 5 to 10 mg as her A1c was still high, although slightly better  Lab Results  Component Value Date   HGBA1C 9.2 (H) 08/03/2018    Patient Active Problem List   Diagnosis Date Noted  . Lobar pneumonia, unspecified organism ([place]) 11/13/2017  . Abnormal cardiovascular stress test   . Precordial chest pain   . Sepsis ([place]) 09/18/2017  . Chronic diastolic CHF (congestive heart failure) ([place]) 09/18/2017  . Acute respiratory failure with hypoxia ([place]) 09/18/2017  . Left hip pain 10/28/2016  . Chronic rhinitis 08/07/2016  . COPD with acute exacerbation ([place]) 04/25/2016  . Essential hypertension 01/30/2016  . High blood triglycerides   . Hypokalemia 01/27/2016  . Depression with anxiety   . CAD (coronary artery disease)   . HLD (hyperlipidemia)   . Type 2 diabetes mellitus without complication, without long-term current use of insulin ([place])   . OSA (obstructive sleep apnea) 05/06/2014  . Dyspnea on exertion 09/02/2012  . Chest pain 12/10/2011  . Chronic cough 05/11/2010  . Obesity (BMI 30-39.9)   . GERD  06/28/2008    Past Medical History:  Diagnosis Date  . Anxiety    Prior suicide attempt  . CAD (coronary artery disease)    a) s/p DES to LAD 07/2005 b) Last Myoview low risk 11/2011 showing small fixed apical perfusion defect (prior MI vs attenuation) but no ischemia - normal EF.  [name] Cervical spondylosis   . Coronary atherosclerosis 06/28/2008  . Depression   . Depression with anxiety   . Diabetes mellitus without complication ([place])   . GERD (gastroesophageal reflux disease)   . Hyperlipidemia   . Hypertension   . Insulin resistance   . Iron deficiency anemia   . Obesity     Past Surgical History:  Procedure Laterality Date  . BREAST ENHANCEMENT SURGERY    . CARDIAC CATHETERIZATION  06/17/2007   NORMAL. EF 60%  . CARDIAC CATHETERIZATION N/A 01/29/2016   Procedure: Left Heart Cath and Coronary Angiography;  Surgeon: Sherren Mocha, MD;  Location: [facility];  Service: Cardiovascular;  Laterality: N/A;  . CERVICAL SPONDYLOSIS     SINGLE LEVEL FUSION  . CHILDBIRTH     X3  . CORONARY STENT PLACEMENT  07/2005   LEFT ANTERIOR DESCENDING  . FOREARM FRACTURE SURGERY  2010   hand and shoulder   . INCISION AND DRAINAGE BREAST ABSCESS  01/05/2012      . INCISION AND DRAINAGE PERIRECTAL ABSCESS N/A 02/18/2014   Procedure: IRRIGATION AND DEBRIDEMENT PERIRECTAL ABSCESS;  Surgeon: Pedro Earls,  MD;  [facility]: [facility];  Service: General;  Laterality: N/A;  . LEFT HEART CATH AND CORONARY ANGIOGRAPHY N/A 10/10/2017   Procedure: LEFT HEART CATH AND CORONARY ANGIOGRAPHY;  Surgeon: Burnell Blanks, MD;  Location: [facility];  Service: Cardiovascular;  Laterality: N/A;  . LUMBAR LAMINECTOMY    . ROTATOR CUFF REPAIR     bilaterla  . TONSILLECTOMY AND ADENOIDECTOMY    . TUBAL LIGATION    . VIDEO BRONCHOSCOPY Bilateral 08/13/2016   Procedure: VIDEO BRONCHOSCOPY WITHOUT FLUORO;  Surgeon: Collene Gobble, MD;  [facility];  Service: Cardiopulmonary;  Laterality:  Bilateral;    Social History   Tobacco Use  . Smoking status: Passive Smoke Exposure - Never Smoker  . Smokeless tobacco: Never Used  Substance Use Topics  . Alcohol use: Yes    Comment: occ  . Drug use: No    Family History  Problem Relation Age of Onset  . Heart attack Mother   . Diabetes Mother   . Lung cancer Mother   . Asthma Mother   . Heart disease Mother   . Suicidality Father        "killed himself"  . Asthma Daughter        x 2  . Cancer Daughter        pre-cancerous polyp  . Diabetes Sister   . Cancer Sister   . Cervical cancer Daughter        cervical   . Allergies Other        all family--seasonal allergies    Allergies  Allergen Reactions  . Prednisone Other (See Comments)    REACTION: mood swings, nightmares. "Shot doesn't bother me, reaction is just with the pill" she states she has had the steroid injections before. From our records methylprednisone was given to her in 2013 without any complications.    Medication list has been reviewed and updated.  Current Outpatient Medications on File Prior to Visit  Medication Sig Dispense Refill  . albuterol (PROAIR HFA) 108 (90 Base) MCG/ACT inhaler Inhale 2 puffs into the lungs every 4 (four) hours as needed for wheezing or shortness of breath. 3 Inhaler 1  . albuterol (PROVENTIL) (2.5 MG/3ML) 0.083% nebulizer solution Take 3 mLs (2.5 mg total) by nebulization every 4 (four) hours as needed for wheezing or shortness of breath. 75 mL 12  . aspirin EC 81 MG tablet Take 81 mg by mouth daily.    . calcium carbonate (TUMS - DOSED IN MG ELEMENTAL CALCIUM) 500 MG chewable tablet Chew 2 tablets by mouth daily as needed for indigestion or heartburn.    . cetirizine (ZYRTEC) 10 MG tablet TAKE 1 TABLET BY MOUTH DAILY AS NEEDED FOR ALLERGIES 90 tablet 1  . Cholecalciferol (VITAMIN D3) 1000 UNITS CAPS Take 1,000 Units by mouth daily.     . clonazePAM (KLONOPIN) 0.5 MG tablet TAKE 0.5 TABLETS (0.25 MG TOTAL) BY MOUTH 2  TIMES DAILY AS NEEDED FOR ANXIETY 20 tablet 1  . dapagliflozin propanediol (FARXIGA) 10 MG TABS tablet Take 10 mg by mouth daily. 90 tablet 3  . doxycycline (VIBRAMYCIN) 100 MG capsule Take 1 capsule (100 mg total) by mouth 2 (two) times daily. 20 capsule 0  . FLUoxetine (PROZAC) 40 MG capsule Take 1 capsule (40 mg total) by mouth daily. 90 capsule 3  . fluticasone (FLONASE) 50 MCG/ACT nasal spray SPRAY 2 SPRAYS INTO EACH NOSTRIL EVERY DAY 48 g 0  . furosemide (LASIX) 20 MG tablet TAKE ONE  TABLET DAILY IF NEEDED FOR LEG SWELLING 30 tablet 1  . gabapentin (NEURONTIN) 300 MG capsule Take 600 mg by mouth 2 (two) times daily.     . hydrALAZINE (APRESOLINE) 25 MG tablet Take 2 tablets (50 mg total) by mouth 3 (three) times daily. 180 tablet 11  . HYDROcodone-acetaminophen (NORCO/VICODIN) 5-325 MG tablet Take 1-2 tablets by mouth every 6 (six) hours as needed for moderate pain or severe pain. DO NOT TAKE WITH ATIVAN, XANAX, OR VALUUM 20 tablet 0  . hydrOXYzine (ATARAX/VISTARIL) 25 MG tablet TAKE 1 TABLET BY MOUTH EVERYDAY AT BEDTIME 90 tablet 1  . ibuprofen (ADVIL,MOTRIN) 200 MG tablet Take 800 mg by mouth every 6 (six) hours as needed for headache or moderate pain.     . Insulin Detemir (LEVEMIR FLEXTOUCH) 100 UNIT/ML Pen Inject 10 Units into the skin daily. 15 mL 3  . ipratropium (ATROVENT) 0.03 % nasal spray Place 2 sprays into the nose 4 (four) times daily. 30 mL 6  . KLOR-CON 10 10 MEQ tablet TAKE 1 TABLET (10 MEQ TOTAL) BY MOUTH DAILY. TAKE ONLY ON DAYS THAT YOU USE LASIX/ FUROSEMIDE 90 tablet 1  . losartan (COZAAR) 100 MG tablet Take 1 tablet (100 mg total) by mouth daily. 90 tablet 3  . lovastatin (MEVACOR) 20 MG tablet TAKE 1 TABLET BY MOUTH EVERYDAY AT BEDTIME 90 tablet 1  . metFORMIN (GLUCOPHAGE-XR) 500 MG 24 hr tablet TAKE 2 TABLETS BY MOUTH TWICE A DAY 360 tablet 0  . methocarbamol (ROBAXIN) 500 MG tablet Take 1 tablet (500 mg total) by mouth 2 (two) times daily. 20 tablet 0  . metoprolol  succinate (TOPROL-XL) 100 MG 24 hr tablet Take 1 tablet (100 mg total) by mouth daily. 90 tablet 3  . montelukast (SINGULAIR) 10 MG tablet Take 1 tablet (10 mg total) by mouth at bedtime. 90 tablet 1  . nitroGLYCERIN (NITROSTAT) 0.4 MG SL tablet Place 1 tablet (0.4 mg total) under the tongue every 5 (five) minutes as needed for chest pain. 20 tablet 3  . omega-3 acid ethyl esters (LOVAZA) 1 g capsule TAKE 1 CAPSULE (1 G TOTAL) BY MOUTH 2 (TWO) TIMES DAILY. 180 capsule 1  . ondansetron (ZOFRAN ODT) 4 MG disintegrating tablet 4mg  ODT q4 hours prn nausea/vomit 20 tablet 0  . ondansetron (ZOFRAN ODT) 4 MG disintegrating tablet Take 1 tablet (4 mg total) by mouth every 8 (eight) hours as needed for nausea or vomiting. 20 tablet 0  . polyethylene glycol (MIRALAX / GLYCOLAX) packet Take 17 g by mouth daily. 14 each 0  . Polyvinyl Alcohol-Povidone (REFRESH OP) Place 2 drops into both eyes daily as needed (for dry eyes).    . tamsulosin (FLOMAX) 0.4 MG CAPS capsule Take 1 capsule (0.4 mg total) by mouth daily for 7 days. 7 capsule 0  . Tiotropium Bromide Monohydrate (SPIRIVA RESPIMAT) 2.5 MCG/ACT AERS Inhale 2 puffs into the lungs daily. 1 Inhaler 11  . traZODone (DESYREL) 50 MG tablet TAKE 1/2-1 TABLET BY MOUTH AT BEDTIME AS NEEDED FOR SLEEP 90 tablet 1   No current facility-administered medications on file prior to visit.     Review of Systems:  As per HPI- otherwise negative.   Physical Examination: There were no vitals filed for this visit. There were no vitals filed for this visit. There is no height or weight on file to calculate BMI. Ideal Body Weight:    GEN: WDWN, NAD, Non-toxic, A & O x 3 HEENT: Atraumatic, Normocephalic. Neck supple. No masses,  No LAD. Ears and Nose: No external deformity. CV: RRR, No M/G/R. No JVD. No thrill. No extra heart sounds. PULM: CTA B, no wheezes, crackles, rhonchi. No retractions. No resp. distress. No accessory muscle use. ABD: S, NT, ND, +BS. No rebound.  No HSM. EXTR: No c/c/e NEURO Normal gait.  PSYCH: Normally interactive. Conversant. Not depressed or anxious appearing.  Calm demeanor.    Assessment and Plan: ***  Signed Lamar Blinks, MD

## 2018-08-26 ENCOUNTER — Ambulatory Visit: Payer: Medicare Other | Admitting: Family Medicine

## 2018-09-01 DIAGNOSIS — N201 Calculus of ureter: Secondary | ICD-10-CM | POA: Diagnosis not present

## 2018-09-01 DIAGNOSIS — N200 Calculus of kidney: Secondary | ICD-10-CM | POA: Diagnosis not present

## 2018-09-02 ENCOUNTER — Ambulatory Visit: Payer: Medicare Other | Admitting: *Deleted

## 2018-09-03 NOTE — Progress Notes (Deleted)
Subjective:   Destiny Ballard is a 68 y.o. female who presents for Medicare Annual (Subsequent) preventive examination.  Review of Systems: No ROS.  Medicare Wellness Visit. Additional risk factors are reflected in the social history.   Sleep patterns:  Home Safety/Smoke Alarms: Feels safe in home. Smoke alarms in place. Lives in 2 story home with daughter. 4 small dogs.  Female:         Mammo- active order      Dexa scan-        CCS-     Objective:     Vitals: There were no vitals taken for this visit.  There is no height or weight on file to calculate BMI.  Advanced Directives 08/22/2018 02/19/2018 02/03/2018 10/24/2017 10/10/2017 09/18/2017 09/17/2017  Does Patient Have a Medical Advance Directive? Yes Yes Yes Yes Yes Yes Yes  Type of Advance Directive - Living will Living will Living will Living will;Healthcare Power of [place] will Living will  Does patient want to make changes to medical advance directive? - - - - No - Patient declined No - Patient declined -  Copy of [place] in Chart? - - - No - copy requested No - copy requested - -  Would patient like information on creating a medical advance directive? - - - - - - -  Pre-existing out of facility DNR order (yellow form or pink MOST form) - - - - - - -    Tobacco Social History   Tobacco Use  Smoking Status Passive Smoke Exposure - Never Smoker  Smokeless Tobacco Never Used     Counseling given: Not Answered   Clinical Intake:                       Past Medical History:  Diagnosis Date  . Anxiety    Prior suicide attempt  . CAD (coronary artery disease)    a) s/p DES to LAD 07/2005 b) Last Myoview low risk 11/2011 showing small fixed apical perfusion defect (prior MI vs attenuation) but no ischemia - normal EF.  [name] Cervical spondylosis   . Coronary atherosclerosis 06/28/2008  . Depression   . Depression with anxiety   . Diabetes mellitus without complication ([place])   . GERD  (gastroesophageal reflux disease)   . Hyperlipidemia   . Hypertension   . Insulin resistance   . Iron deficiency anemia   . Obesity    Past Surgical History:  Procedure Laterality Date  . BREAST ENHANCEMENT SURGERY    . CARDIAC CATHETERIZATION  06/17/2007   NORMAL. EF 60%  . CARDIAC CATHETERIZATION N/A 01/29/2016   Procedure: Left Heart Cath and Coronary Angiography;  Surgeon: Sherren Mocha, MD;  Location: [facility];  Service: Cardiovascular;  Laterality: N/A;  . CERVICAL SPONDYLOSIS     SINGLE LEVEL FUSION  . CHILDBIRTH     X3  . CORONARY STENT PLACEMENT  07/2005   LEFT ANTERIOR DESCENDING  . FOREARM FRACTURE SURGERY  2010   hand and shoulder   . INCISION AND DRAINAGE BREAST ABSCESS  01/05/2012      . INCISION AND DRAINAGE PERIRECTAL ABSCESS N/A 02/18/2014   Procedure: IRRIGATION AND DEBRIDEMENT PERIRECTAL ABSCESS;  Surgeon: Pedro Earls, MD;  [facility];  Service: General;  Laterality: N/A;  . LEFT HEART CATH AND CORONARY ANGIOGRAPHY N/A 10/10/2017   Procedure: LEFT HEART CATH AND CORONARY ANGIOGRAPHY;  Surgeon: Burnell Blanks, MD;  Location: [facility];  Service: Cardiovascular;  Laterality: N/A;  . LUMBAR LAMINECTOMY    . ROTATOR CUFF REPAIR     bilaterla  . TONSILLECTOMY AND ADENOIDECTOMY    . TUBAL LIGATION    . VIDEO BRONCHOSCOPY Bilateral 08/13/2016   Procedure: VIDEO BRONCHOSCOPY WITHOUT FLUORO;  Surgeon: Collene Gobble, MD;  [facility];  Service: Cardiopulmonary;  Laterality: Bilateral;   Family History  Problem Relation Age of Onset  . Heart attack Mother   . Diabetes Mother   . Lung cancer Mother   . Asthma Mother   . Heart disease Mother   . Suicidality Father        "killed himself"  . Asthma Daughter        x 2  . Cancer Daughter        pre-cancerous polyp  . Diabetes Sister   . Cancer Sister   . Cervical cancer Daughter        cervical   . Allergies Other        all family--seasonal allergies   Social  History   Socioeconomic History  . Marital status: Widowed    Spouse name: [place] Bells  . Number of children: 3  . Years of education: 79  . Highest education level: Not on file  Occupational History  . Occupation: retired from [employer]: 11/2012  Social Needs  . Financial resource strain: Not on file  . Food insecurity:    Worry: Not on file    Inability: Not on file  . Transportation needs:    Medical: Not on file    Non-medical: Not on file  Tobacco Use  . Smoking status: Passive Smoke Exposure - Never Smoker  . Smokeless tobacco: Never Used  Substance and Sexual Activity  . Alcohol use: Yes    Comment: occ  . Drug use: No  . Sexual activity: Not Currently    Partners: Male    Birth control/protection: None  Lifestyle  . Physical activity:    Days per week: Not on file    Minutes per session: Not on file  . Stress: Not on file  Relationships  . Social connections:    Talks on phone: Not on file    Gets together: Not on file    Attends religious service: Not on file    Active member of club or organization: Not on file    Attends meetings of clubs or organizations: Not on file    Relationship status: Not on file  Other Topics Concern  . Not on file  Social History Narrative   Lives with her husband.  Their eldest daughter lives upstairs.    Outpatient Encounter Medications as of 09/04/2018  Medication Sig  . albuterol (PROAIR HFA) 108 (90 Base) MCG/ACT inhaler Inhale 2 puffs into the lungs every 4 (four) hours as needed for wheezing or shortness of breath.  [name] albuterol (PROVENTIL) (2.5 MG/3ML) 0.083% nebulizer solution Take 3 mLs (2.5 mg total) by nebulization every 4 (four) hours as needed for wheezing or shortness of breath.  [name] aspirin EC 81 MG tablet Take 81 mg by mouth daily.  . calcium carbonate (TUMS - DOSED IN MG ELEMENTAL CALCIUM) 500 MG chewable tablet Chew 2 tablets by mouth daily as needed for indigestion or heartburn.  . cetirizine (ZYRTEC) 10 MG  tablet TAKE 1 TABLET BY MOUTH DAILY AS NEEDED FOR ALLERGIES  . Cholecalciferol (VITAMIN D3) 1000 UNITS CAPS Take 1,000 Units by mouth daily.   [name]  clonazePAM (KLONOPIN) 0.5 MG tablet TAKE 0.5 TABLETS (0.25 MG TOTAL) BY MOUTH 2 TIMES DAILY AS NEEDED FOR ANXIETY  . dapagliflozin propanediol (FARXIGA) 10 MG TABS tablet Take 10 mg by mouth daily.  [name] doxycycline (VIBRAMYCIN) 100 MG capsule Take 1 capsule (100 mg total) by mouth 2 (two) times daily.  [name] FLUoxetine (PROZAC) 40 MG capsule Take 1 capsule (40 mg total) by mouth daily.  . fluticasone (FLONASE) 50 MCG/ACT nasal spray SPRAY 2 SPRAYS INTO EACH NOSTRIL EVERY DAY  . furosemide (LASIX) 20 MG tablet TAKE ONE TABLET DAILY IF NEEDED FOR LEG SWELLING  . gabapentin (NEURONTIN) 300 MG capsule Take 600 mg by mouth 2 (two) times daily.   . hydrALAZINE (APRESOLINE) 25 MG tablet Take 2 tablets (50 mg total) by mouth 3 (three) times daily.  [name] HYDROcodone-acetaminophen (NORCO/VICODIN) 5-325 MG tablet Take 1-2 tablets by mouth every 6 (six) hours as needed for moderate pain or severe pain. DO NOT TAKE WITH ATIVAN, XANAX, OR VALUUM  . hydrOXYzine (ATARAX/VISTARIL) 25 MG tablet TAKE 1 TABLET BY MOUTH EVERYDAY AT BEDTIME  . ibuprofen (ADVIL,MOTRIN) 200 MG tablet Take 800 mg by mouth every 6 (six) hours as needed for headache or moderate pain.   . Insulin Detemir (LEVEMIR FLEXTOUCH) 100 UNIT/ML Pen Inject 10 Units into the skin daily.  [name] ipratropium (ATROVENT) 0.03 % nasal spray Place 2 sprays into the nose 4 (four) times daily.  [name] KLOR-CON 10 10 MEQ tablet TAKE 1 TABLET (10 MEQ TOTAL) BY MOUTH DAILY. TAKE ONLY ON DAYS THAT YOU USE LASIX/ FUROSEMIDE  . losartan (COZAAR) 100 MG tablet Take 1 tablet (100 mg total) by mouth daily.  [name] lovastatin (MEVACOR) 20 MG tablet TAKE 1 TABLET BY MOUTH EVERYDAY AT BEDTIME  . metFORMIN (GLUCOPHAGE-XR) 500 MG 24 hr tablet TAKE 2 TABLETS BY MOUTH TWICE A DAY  . methocarbamol (ROBAXIN) 500 MG tablet Take 1 tablet (500 mg total) by mouth  2 (two) times daily.  . metoprolol succinate (TOPROL-XL) 100 MG 24 hr tablet Take 1 tablet (100 mg total) by mouth daily.  . montelukast (SINGULAIR) 10 MG tablet Take 1 tablet (10 mg total) by mouth at bedtime.  . nitroGLYCERIN (NITROSTAT) 0.4 MG SL tablet Place 1 tablet (0.4 mg total) under the tongue every 5 (five) minutes as needed for chest pain.  [name] omega-3 acid ethyl esters (LOVAZA) 1 g capsule TAKE 1 CAPSULE (1 G TOTAL) BY MOUTH 2 (TWO) TIMES DAILY.  [name] ondansetron (ZOFRAN ODT) 4 MG disintegrating tablet 4mg  ODT q4 hours prn nausea/vomit  . ondansetron (ZOFRAN ODT) 4 MG disintegrating tablet Take 1 tablet (4 mg total) by mouth every 8 (eight) hours as needed for nausea or vomiting.  . polyethylene glycol (MIRALAX / GLYCOLAX) packet Take 17 g by mouth daily.  . Polyvinyl Alcohol-Povidone (REFRESH OP) Place 2 drops into both eyes daily as needed (for dry eyes).  . Tiotropium Bromide Monohydrate (SPIRIVA RESPIMAT) 2.5 MCG/ACT AERS Inhale 2 puffs into the lungs daily.  . traZODone (DESYREL) 50 MG tablet TAKE 1/2-1 TABLET BY MOUTH AT BEDTIME AS NEEDED FOR SLEEP   No facility-administered encounter medications on file as of 09/04/2018.     Activities of Daily Living In your present state of health, do you have any difficulty performing the following activities: 02/03/2018 10/24/2017  Hearing? N N  Vision? N N  Difficulty concentrating or making decisions? N N  Walking or climbing stairs? Y Y  Comment legs are weak -  Dressing or bathing? N N  Doing errands, shopping? N -  Preparing Food and eating ? N N  Using the Toilet? N N  In the past six months, have you accidently leaked urine? Y Y  Do you have problems with loss of bowel control? Y N  Managing your Medications? N N  Managing your Finances? N N  Housekeeping or managing your Housekeeping? N N  Some recent data might be hidden    Patient Care Team: Copland, Gay Filler, MD as PCP - General (Family Medicine) Brien Few, MD as  Consulting Physician (Obstetrics and Gynecology) Leona Singleton, RN as [facility]    Assessment:   This is a routine wellness examination for Dylann. Physical assessment deferred to PCP.  Exercise Activities and Dietary recommendations   Diet (meal preparation, eat out, water intake, caffeinated beverages, dairy products, fruits and vegetables): {Desc; diets:16563} Breakfast: Lunch:  Dinner:      Goals    . remain mentally and emotionally stable after loss of husband       Fall Risk Fall Risk  05/04/2018 02/03/2018 09/01/2017 09/01/2017 08/27/2016  Falls in the past year? No No No No No  Risk for fall due to : Impaired balance/gait;Impaired mobility;Medication side effect Impaired balance/gait;Impaired mobility;Medication side effect - - -    Depression Screen PHQ 2/9 Scores 02/03/2018 10/24/2017 09/01/2017 08/27/2016  PHQ - 2 Score 2 6 6 3   PHQ- 9 Score 6 9 21 15   Exception Documentation - - - -     Cognitive Function MMSE - Mini Mental State Exam 09/01/2017  Orientation to time 5  Orientation to Place 5  Registration 3  Attention/ Calculation 5  Recall 3  Language- name 2 objects 2  Language- repeat 1  Language- follow 3 step command 3  Language- read & follow direction 1  Write a sentence 1  Copy design 1  Total score 30        Immunization History  Administered Date(s) Administered  . Influenza Whole 04/14/2010  . Influenza, High Dose Seasonal PF 04/03/2017, 05/11/2018  . Influenza,inj,Quad PF,6+ Mos 05/10/2013, 04/05/2014, 05/22/2015, 04/25/2016  . Pneumococcal Conjugate-13 07/24/2016  . Pneumococcal Polysaccharide-23 04/05/2014, 05/22/2015  . Tdap 07/11/2014   Screening Tests Health Maintenance  Topic Date Due  . MAMMOGRAM  04/17/2017  . OPHTHALMOLOGY EXAM  08/26/2018  . FOOT EXAM  01/06/2019  . HEMOGLOBIN A1C  02/01/2019  . COLONOSCOPY  10/30/2021  . TETANUS/TDAP  07/11/2024  . INFLUENZA VACCINE  Completed  . DEXA SCAN   Completed  . Hepatitis C Screening  Completed  . PNA vac Low Risk Adult  Completed      Plan:   ***   I have personally reviewed and noted the following in the patient's chart:   . Medical and social history . Use of alcohol, tobacco or illicit drugs  . Current medications and supplements . Functional ability and status . Nutritional status . Physical activity . Advanced directives . List of other physicians . Hospitalizations, surgeries, and ER visits in previous 12 months . Vitals . Screenings to include cognitive, depression, and falls . Referrals and appointments  In addition, I have reviewed and discussed with patient certain preventive protocols, quality metrics, and best practice recommendations. A written personalized care plan for preventive services as well as general preventive health recommendations were provided to patient.     Naaman Plummer [place], [place]  09/03/2018

## 2018-09-04 ENCOUNTER — Ambulatory Visit: Payer: Medicare Other | Admitting: *Deleted

## 2018-09-19 ENCOUNTER — Other Ambulatory Visit: Payer: Self-pay | Admitting: Family Medicine

## 2018-09-19 DIAGNOSIS — I10 Essential (primary) hypertension: Secondary | ICD-10-CM

## 2018-09-22 ENCOUNTER — Other Ambulatory Visit: Payer: Self-pay | Admitting: Endocrinology

## 2018-09-22 DIAGNOSIS — Z794 Long term (current) use of insulin: Principal | ICD-10-CM

## 2018-09-22 DIAGNOSIS — IMO0002 Reserved for concepts with insufficient information to code with codable children: Secondary | ICD-10-CM

## 2018-09-22 DIAGNOSIS — E1165 Type 2 diabetes mellitus with hyperglycemia: Secondary | ICD-10-CM

## 2018-09-22 DIAGNOSIS — E118 Type 2 diabetes mellitus with unspecified complications: Secondary | ICD-10-CM

## 2018-10-02 ENCOUNTER — Other Ambulatory Visit: Payer: Self-pay | Admitting: *Deleted

## 2018-10-02 ENCOUNTER — Other Ambulatory Visit: Payer: Self-pay

## 2018-10-02 ENCOUNTER — Encounter: Payer: Self-pay | Admitting: *Deleted

## 2018-10-02 NOTE — Patient Outreach (Signed)
[facility]) Care Management  [facility] Care Manager  10/02/2018   Destiny Ballard 21-Feb-1951 [id number]   [place] Quarterly Outreach   Referral Date:  12/25/2017 Referral Source:  Transfer from [place] Reason for Referral:  Continued Disease Management Education Insurance:  Medicare   Outreach Attempt:  Successful telephone outreach to patient for follow up.  HIPAA verified with patient.  Patient reporting she recently returned from [place], [place] visiting [place] 2/28-3/3.  States she feels fine, cough is the same as normal and shortness of breath has not increased.  Denies any fever.  States she has not had to use her rescue inhaler in the past week.  Fasting blood sugar this morning was 163.  Last Hgb A1C was 9.2 on 08/03/2018.  Discussed and encouraged diabetic diet with patient.  Reports Emergency Room visit in February related kidney stone.  States she had to have surgery to remove stone.  Encounter Medications:  Outpatient Encounter Medications as of 10/02/2018  Medication Sig Note  . albuterol (PROAIR HFA) 108 (90 Base) MCG/ACT inhaler Inhale 2 puffs into the lungs every 4 (four) hours as needed for wheezing or shortness of breath.   [name] albuterol (PROVENTIL) (2.5 MG/3ML) 0.083% nebulizer solution Take 3 mLs (2.5 mg total) by nebulization every 4 (four) hours as needed for wheezing or shortness of breath.   [name] aspirin EC 81 MG tablet Take 81 mg by mouth daily.   . calcium carbonate (TUMS - DOSED IN MG ELEMENTAL CALCIUM) 500 MG chewable tablet Chew 2 tablets by mouth daily as needed for indigestion or heartburn.   . cetirizine (ZYRTEC) 10 MG tablet TAKE 1 TABLET BY MOUTH DAILY AS NEEDED FOR ALLERGIES   . Cholecalciferol (VITAMIN D3) 1000 UNITS CAPS Take 1,000 Units by mouth daily.    . clonazePAM (KLONOPIN) 0.5 MG tablet TAKE 0.5 TABLETS (0.25 MG TOTAL) BY MOUTH 2 TIMES DAILY AS NEEDED FOR ANXIETY   . dapagliflozin propanediol (FARXIGA) 10  MG TABS tablet Take 10 mg by mouth daily.   [name] FLUoxetine (PROZAC) 40 MG capsule Take 1 capsule (40 mg total) by mouth daily.   . fluticasone (FLONASE) 50 MCG/ACT nasal spray SPRAY 2 SPRAYS INTO EACH NOSTRIL EVERY DAY   . furosemide (LASIX) 20 MG tablet TAKE ONE TABLET DAILY IF NEEDED FOR LEG SWELLING   . hydrALAZINE (APRESOLINE) 25 MG tablet Take 2 tablets (50 mg total) by mouth 3 (three) times daily.   [name] HYDROcodone-acetaminophen (NORCO/VICODIN) 5-325 MG tablet Take 1-2 tablets by mouth every 6 (six) hours as needed for moderate pain or severe pain. DO NOT TAKE WITH ATIVAN, XANAX, OR VALUUM   . hydrOXYzine (ATARAX/VISTARIL) 25 MG tablet TAKE 1 TABLET BY MOUTH EVERYDAY AT BEDTIME   . ibuprofen (ADVIL,MOTRIN) 200 MG tablet Take 800 mg by mouth every 6 (six) hours as needed for headache or moderate pain.    . Insulin Detemir (LEVEMIR FLEXTOUCH) 100 UNIT/ML Pen Inject 10 Units into the skin daily. 10/02/2018: Reports taking 24 units daily  . ipratropium (ATROVENT) 0.03 % nasal spray Place 2 sprays into the nose 4 (four) times daily.   [name] KLOR-CON 10 10 MEQ tablet TAKE 1 TABLET (10 MEQ TOTAL) BY MOUTH DAILY. TAKE ONLY ON DAYS THAT YOU USE LASIX/ FUROSEMIDE   . lovastatin (MEVACOR) 20 MG tablet TAKE 1 TABLET BY MOUTH EVERYDAY AT BEDTIME   . metFORMIN (GLUCOPHAGE-XR) 500 MG 24 hr tablet TAKE 2 TABLETS BY MOUTH TWICE A DAY   .  methocarbamol (ROBAXIN) 500 MG tablet Take 1 tablet (500 mg total) by mouth 2 (two) times daily.   . metoprolol succinate (TOPROL-XL) 100 MG 24 hr tablet TAKE 1 TABLET BY MOUTH EVERY DAY   . montelukast (SINGULAIR) 10 MG tablet Take 1 tablet (10 mg total) by mouth at bedtime.   . nitroGLYCERIN (NITROSTAT) 0.4 MG SL tablet Place 1 tablet (0.4 mg total) under the tongue every 5 (five) minutes as needed for chest pain.   [name] omega-3 acid ethyl esters (LOVAZA) 1 g capsule TAKE 1 CAPSULE (1 G TOTAL) BY MOUTH 2 (TWO) TIMES DAILY.   [name] ondansetron (ZOFRAN ODT) 4 MG disintegrating tablet 33m ODT  q4 hours prn nausea/vomit   . ondansetron (ZOFRAN ODT) 4 MG disintegrating tablet Take 1 tablet (4 mg total) by mouth every 8 (eight) hours as needed for nausea or vomiting.   . polyethylene glycol (MIRALAX / GLYCOLAX) packet Take 17 g by mouth daily.   . Polyvinyl Alcohol-Povidone (REFRESH OP) Place 2 drops into both eyes daily as needed (for dry eyes).   . Tiotropium Bromide Monohydrate (SPIRIVA RESPIMAT) 2.5 MCG/ACT AERS Inhale 2 puffs into the lungs daily.   . traZODone (DESYREL) 50 MG tablet TAKE 1/2-1 TABLET BY MOUTH AT BEDTIME AS NEEDED FOR SLEEP   . doxycycline (VIBRAMYCIN) 100 MG capsule Take 1 capsule (100 mg total) by mouth 2 (two) times daily.   .Marland Kitchengabapentin (NEURONTIN) 300 MG capsule Take 600 mg by mouth 2 (two) times daily.  10/02/2018: States she does not have any refills  . losartan (COZAAR) 100 MG tablet Take 1 tablet (100 mg total) by mouth daily. 10/02/2018: Continues to to take   No facility-administered encounter medications on file as of 10/02/2018.     Functional Status:  In your present state of health, do you have any difficulty performing the following activities: 02/03/2018 10/24/2017  Hearing? N N  Vision? N N  Difficulty concentrating or making decisions? N N  Walking or climbing stairs? Y Y  Comment legs are weak -  Dressing or bathing? N N  Doing errands, shopping? N -  Preparing Food and eating ? N N  Using the Toilet? N N  In the past six months, have you accidently leaked urine? Y Y  Do you have problems with loss of bowel control? Y N  Managing your Medications? N N  Managing your Finances? N N  Housekeeping or managing your Housekeeping? N N  Some recent data might be hidden    Fall/Depression Screening: Fall Risk  10/02/2018 05/04/2018 02/03/2018  Falls in the past year? 0 No No  Risk for fall due to : Impaired balance/gait;Impaired mobility;Impaired vision Impaired balance/gait;Impaired mobility;Medication side effect Impaired balance/gait;Impaired  mobility;Medication side effect  Follow up Falls evaluation completed;Falls prevention discussed;Education provided - -   PHQ 2/9 Scores 02/03/2018 10/24/2017 09/01/2017 08/27/2016 09/20/2015  PHQ - 2 Score 2 6 6 3  0  PHQ- 9 Score 6 9 21 15  -  Exception Documentation - - - - Patient refusal   THN CM Care Plan Problem One     Most Recent Value  Care Plan Problem One  Knowledge deficiet of COPD diagnosis and self care management of diabetes  Role Documenting the Problem One  [facility] [place] Problem [place]  Active  THN Long Term Goal   Patient will discuss gabapentin and foot pain with primary care provider in the next 90 days  THN Long Term Goal Start Date  10/02/18  THN Long Term Goal Met Date  10/02/18  Interventions for Problem One Long Term Goal  Reviewed and discussed current care plan and goals with patient, reviewed medications and indications and encouraged medication compliance, encouraged patient to discuss gabapentin prescription with primary care provider and need for refill on medication, encouraged patient to discuss foot pain with physician, discussed diabetic neuropathy with patient, encouraged to schedule and attend follow up appointments  [facility] CM Short Term Goal #1   Patient will not have any emergency room visits in the next 90 days.  THN CM Short Term Goal #1 Start Date  10/02/18  [facility] CM Short Term Goal #1 Met Date  10/02/18  Interventions for Short Term Goal #1  Reviewed signs and symptoms of Coronovirus, encouraged patient to contact provider if she develops fever or worsening cough or shortness of breath, discussed social distancing and limiting contact with others, discussed sanitizing versus cleaning  THN CM Short Term Goal #2   Patient will schedule pulmonary follow up appointment with Dr. Lamonte Sakai in the next 90 days.  THN CM Short Term Goal #2 Start Date  10/02/18  Interventions for Short Term Goal #2  Reviewed with patient she has not been seen by pulmonologist since  May 2019, encouraged patient to contact pulmonologist office to schedule appointment, discussed importance of medical follow up     Appointments:  Last attended appointment with primary care, Dr. Edilia Bo on 08/03/2018 and needs to schedule follow up appointment.  Needs to reschedule Annual Wellness Visit.  Also needs to schedule Pulmonology Follow up.  Patient encouraged to schedule medical follow up appointments.  Plan: RN Health Coach will make next telephone outreach to patient in the month of June. RN Health Coach will send primary care provider quarterly update.  [facility] [phone] [email or web address]

## 2018-10-07 ENCOUNTER — Ambulatory Visit: Payer: Medicare Other | Admitting: Family Medicine

## 2018-10-09 ENCOUNTER — Other Ambulatory Visit: Payer: Self-pay | Admitting: Family Medicine

## 2018-10-09 DIAGNOSIS — R05 Cough: Secondary | ICD-10-CM

## 2018-10-09 DIAGNOSIS — R053 Chronic cough: Secondary | ICD-10-CM

## 2018-10-12 ENCOUNTER — Encounter: Payer: Self-pay | Admitting: Family Medicine

## 2018-10-12 ENCOUNTER — Ambulatory Visit (INDEPENDENT_AMBULATORY_CARE_PROVIDER_SITE_OTHER): Payer: Medicare Other | Admitting: Family Medicine

## 2018-10-12 ENCOUNTER — Telehealth: Payer: Self-pay

## 2018-10-12 ENCOUNTER — Other Ambulatory Visit: Payer: Self-pay

## 2018-10-12 DIAGNOSIS — J302 Other seasonal allergic rhinitis: Secondary | ICD-10-CM | POA: Diagnosis not present

## 2018-10-12 DIAGNOSIS — R05 Cough: Secondary | ICD-10-CM | POA: Diagnosis not present

## 2018-10-12 DIAGNOSIS — R059 Cough, unspecified: Secondary | ICD-10-CM

## 2018-10-12 MED ORDER — MONTELUKAST SODIUM 10 MG PO TABS: 10.0000 mg | ORAL_TABLET | Freq: Every day | ORAL | 1 refills | Status: DC

## 2018-10-12 MED ORDER — BENZONATATE 200 MG PO CAPS: 200.0000 mg | ORAL_CAPSULE | Freq: Three times a day (TID) | ORAL | 0 refills | Status: DC | PRN

## 2018-10-12 NOTE — Progress Notes (Signed)
[place] at [facility] [address], [place], [place] [address] [phone]  Date:  10/12/2018   Name:  Destiny Ballard   DOB:  June 28, 1951   MRN:  [id number]  PCP:  Darreld Mclean, MD    Chief Complaint: No chief complaint on file.   History of Present Illness:  Destiny Ballard is a 68 y.o. very pleasant female patient who presents with the following:  Pt ID confirmed with name and DOB Pt with history of CHF, COPD, DM, HTN, CAD  Virtual visit today for concern of sore throat -for about a week  She has not looked in her throat - unsure if any red spots  She has noted a cough and sneeze, some runny nose  No fever She thinks this is allergies  She feels some congestion in her chest  Last seen by myself in January with a COPD exacerbation  Does not feel like she has a COPD exacerbation now  No more SOB than is baseline for her   No vomiting.  She tends to have some diarrhea, but not worse then normal for her  Her throat is sore to swallow  She has taken some nyquil for her allergies She thinks she is also taking zyretc She is not using her albuterol- has not needed it  She is using spiriva   She is not checking her BP or pulse at home Her glucose is running under 200  No swelling She notes that her weight is stable at 172 lbs  Wt Readings from Last 3 Encounters:  08/22/18 172 lb (78 kg)  08/03/18 172 lb (78 kg)  05/19/18 175 lb (79.4 kg)      Patient Active Problem List   Diagnosis Date Noted  . Lobar pneumonia, unspecified organism ([place]) 11/13/2017  . Abnormal cardiovascular stress test   . Precordial chest pain   . Sepsis ([place]) 09/18/2017  . Chronic diastolic CHF (congestive heart failure) ([place]) 09/18/2017  . Acute respiratory failure with hypoxia ([place]) 09/18/2017  . Diabetes mellitus ([place]) 09/08/2017  . Left hip pain 10/28/2016  . Chronic rhinitis 08/07/2016  . COPD with acute exacerbation ([place])  04/25/2016  . Essential hypertension 01/30/2016  . High blood triglycerides   . Hypokalemia 01/27/2016  . Depression with anxiety   . CAD (coronary artery disease)   . HLD (hyperlipidemia)   . Type 2 diabetes mellitus without complication, without long-term current use of insulin ([place])   . OSA (obstructive sleep apnea) 05/06/2014  . Dyspnea on exertion 09/02/2012  . Chest pain 12/10/2011  . Chronic cough 05/11/2010  . Obesity (BMI 30-39.9)   . GERD 06/28/2008    Past Medical History:  Diagnosis Date  . Anxiety    Prior suicide attempt  . CAD (coronary artery disease)    a) s/p DES to LAD 07/2005 b) Last Myoview low risk 11/2011 showing small fixed apical perfusion defect (prior MI vs attenuation) but no ischemia - normal EF.  [name] Cervical spondylosis   . Coronary atherosclerosis 06/28/2008  . Depression   . Depression with anxiety   . Diabetes mellitus without complication ([place])   . GERD (gastroesophageal reflux disease)   . Hyperlipidemia   . Hypertension   . Insulin resistance   . Iron deficiency anemia   . Obesity     Past Surgical History:  Procedure Laterality Date  . BREAST ENHANCEMENT SURGERY    . CARDIAC CATHETERIZATION  06/17/2007  NORMAL. EF 60%  . CARDIAC CATHETERIZATION N/A 01/29/2016   Procedure: Left Heart Cath and Coronary Angiography;  Surgeon: Sherren Mocha, MD;  Location: [place] [facility];  Service: Cardiovascular;  Laterality: N/A;  . CERVICAL SPONDYLOSIS     SINGLE LEVEL FUSION  . CHILDBIRTH     X3  . CORONARY STENT PLACEMENT  07/2005   LEFT ANTERIOR DESCENDING  . FOREARM FRACTURE SURGERY  2010   hand and shoulder   . INCISION AND DRAINAGE BREAST ABSCESS  01/05/2012      . INCISION AND DRAINAGE PERIRECTAL ABSCESS N/A 02/18/2014   Procedure: IRRIGATION AND DEBRIDEMENT PERIRECTAL ABSCESS;  Surgeon: Pedro Earls, MD;  Location: [facility];  Service: General;  Laterality: N/A;  . LEFT HEART CATH AND CORONARY ANGIOGRAPHY N/A 10/10/2017   Procedure: LEFT  HEART CATH AND CORONARY ANGIOGRAPHY;  Surgeon: Burnell Blanks, MD;  Location: [place] [facility];  Service: Cardiovascular;  Laterality: N/A;  . LUMBAR LAMINECTOMY    . ROTATOR CUFF REPAIR     bilaterla  . TONSILLECTOMY AND ADENOIDECTOMY    . TUBAL LIGATION    . VIDEO BRONCHOSCOPY Bilateral 08/13/2016   Procedure: VIDEO BRONCHOSCOPY WITHOUT FLUORO;  Surgeon: Collene Gobble, MD;  Location: [facility];  Service: Cardiopulmonary;  Laterality: Bilateral;    Social History   Tobacco Use  . Smoking status: Passive Smoke Exposure - Never Smoker  . Smokeless tobacco: Never Used  Substance Use Topics  . Alcohol use: Yes    Comment: occ  . Drug use: No    Family History  Problem Relation Age of Onset  . Heart attack Mother   . Diabetes Mother   . Lung cancer Mother   . Asthma Mother   . Heart disease Mother   . Suicidality Father        "killed himself"  . Asthma Daughter        x 2  . Cancer Daughter        pre-cancerous polyp  . Diabetes Sister   . Cancer Sister   . Cervical cancer Daughter        cervical   . Allergies Other        all family--seasonal allergies    Allergies  Allergen Reactions  . Prednisone Other (See Comments)    REACTION: mood swings, nightmares. "Shot doesn't bother me, reaction is just with the pill" she states she has had the steroid injections before. From our records methylprednisone was given to her in 2013 without any complications.    Medication list has been reviewed and updated.  Current Outpatient Medications on File Prior to Visit  Medication Sig Dispense Refill  . albuterol (PROAIR HFA) 108 (90 Base) MCG/ACT inhaler Inhale 2 puffs into the lungs every 4 (four) hours as needed for wheezing or shortness of breath. 3 Inhaler 1  . albuterol (PROVENTIL) (2.5 MG/3ML) 0.083% nebulizer solution Take 3 mLs (2.5 mg total) by nebulization every 4 (four) hours as needed for wheezing or shortness of breath. 75 mL 12  . aspirin EC 81 MG  tablet Take 81 mg by mouth daily.    . calcium carbonate (TUMS - DOSED IN MG ELEMENTAL CALCIUM) 500 MG chewable tablet Chew 2 tablets by mouth daily as needed for indigestion or heartburn.    . cetirizine (ZYRTEC) 10 MG tablet TAKE 1 TABLET BY MOUTH DAILY AS NEEDED FOR ALLERGIES 90 tablet 1  . Cholecalciferol (VITAMIN D3) 1000 UNITS CAPS Take 1,000 Units by mouth daily.     [name]  clonazePAM (KLONOPIN) 0.5 MG tablet TAKE 0.5 TABLETS (0.25 MG TOTAL) BY MOUTH 2 TIMES DAILY AS NEEDED FOR ANXIETY 20 tablet 1  . dapagliflozin propanediol (FARXIGA) 10 MG TABS tablet Take 10 mg by mouth daily. 90 tablet 3  . doxycycline (VIBRAMYCIN) 100 MG capsule Take 1 capsule (100 mg total) by mouth 2 (two) times daily. 20 capsule 0  . FLUoxetine (PROZAC) 40 MG capsule Take 1 capsule (40 mg total) by mouth daily. 90 capsule 3  . fluticasone (FLONASE) 50 MCG/ACT nasal spray SPRAY 2 SPRAYS INTO EACH NOSTRIL EVERY DAY 48 g 0  . furosemide (LASIX) 20 MG tablet TAKE ONE TABLET DAILY IF NEEDED FOR LEG SWELLING 30 tablet 1  . gabapentin (NEURONTIN) 300 MG capsule Take 600 mg by mouth 2 (two) times daily.     . hydrALAZINE (APRESOLINE) 25 MG tablet Take 2 tablets (50 mg total) by mouth 3 (three) times daily. 180 tablet 11  . HYDROcodone-acetaminophen (NORCO/VICODIN) 5-325 MG tablet Take 1-2 tablets by mouth every 6 (six) hours as needed for moderate pain or severe pain. DO NOT TAKE WITH ATIVAN, XANAX, OR VALUUM 20 tablet 0  . hydrOXYzine (ATARAX/VISTARIL) 25 MG tablet TAKE 1 TABLET BY MOUTH EVERYDAY AT BEDTIME 90 tablet 1  . ibuprofen (ADVIL,MOTRIN) 200 MG tablet Take 800 mg by mouth every 6 (six) hours as needed for headache or moderate pain.     . Insulin Detemir (LEVEMIR FLEXTOUCH) 100 UNIT/ML Pen Inject 10 Units into the skin daily. 15 mL 3  . ipratropium (ATROVENT) 0.03 % nasal spray Place 2 sprays into the nose 4 (four) times daily. 30 mL 6  . KLOR-CON 10 10 MEQ tablet TAKE 1 TABLET (10 MEQ TOTAL) BY MOUTH DAILY. TAKE ONLY ON  DAYS THAT YOU USE LASIX/ FUROSEMIDE 90 tablet 1  . losartan (COZAAR) 100 MG tablet Take 1 tablet (100 mg total) by mouth daily. 90 tablet 3  . lovastatin (MEVACOR) 20 MG tablet TAKE 1 TABLET BY MOUTH EVERYDAY AT BEDTIME 90 tablet 1  . metFORMIN (GLUCOPHAGE-XR) 500 MG 24 hr tablet TAKE 2 TABLETS BY MOUTH TWICE A DAY 360 tablet 0  . methocarbamol (ROBAXIN) 500 MG tablet Take 1 tablet (500 mg total) by mouth 2 (two) times daily. 20 tablet 0  . metoprolol succinate (TOPROL-XL) 100 MG 24 hr tablet TAKE 1 TABLET BY MOUTH EVERY DAY 90 tablet 3  . montelukast (SINGULAIR) 10 MG tablet Take 1 tablet (10 mg total) by mouth at bedtime. 90 tablet 1  . nitroGLYCERIN (NITROSTAT) 0.4 MG SL tablet Place 1 tablet (0.4 mg total) under the tongue every 5 (five) minutes as needed for chest pain. 20 tablet 3  . omega-3 acid ethyl esters (LOVAZA) 1 g capsule TAKE 1 CAPSULE (1 G TOTAL) BY MOUTH 2 (TWO) TIMES DAILY. 180 capsule 1  . ondansetron (ZOFRAN ODT) 4 MG disintegrating tablet 4mg  ODT q4 hours prn nausea/vomit 20 tablet 0  . ondansetron (ZOFRAN ODT) 4 MG disintegrating tablet Take 1 tablet (4 mg total) by mouth every 8 (eight) hours as needed for nausea or vomiting. 20 tablet 0  . polyethylene glycol (MIRALAX / GLYCOLAX) packet Take 17 g by mouth daily. 14 each 0  . Polyvinyl Alcohol-Povidone (REFRESH OP) Place 2 drops into both eyes daily as needed (for dry eyes).    . Tiotropium Bromide Monohydrate (SPIRIVA RESPIMAT) 2.5 MCG/ACT AERS Inhale 2 puffs into the lungs daily. 1 Inhaler 11  . traZODone (DESYREL) 50 MG tablet TAKE 1/2-1 TABLET BY MOUTH AT  BEDTIME AS NEEDED FOR SLEEP 90 tablet 1   No current facility-administered medications on file prior to visit.     Review of Systems:  As per HPI- otherwise negative.   Physical Examination: There were no vitals filed for this visit. There were no vitals filed for this visit. There is no height or weight on file to calculate BMI. Ideal Body Weight:    Pt  looks well, her normal self No distress or tachypnea.  Assessment and Plan: Cough - Plan: benzonatate (TESSALON) 200 MG capsule  Seasonal allergies - Plan: montelukast (SINGULAIR) 10 MG tablet  Web visit today for cough and seasonal allergies.  Refilled her Singulair which she has taken the past, also Tessalon as needed for cough.  At this time she does not have any distress or other sign of serious illness.  However, I have urged her to alert me if she is getting worse or has any other concern- she will do so    She does not have my chart, so I did not send visit summary  Signed Lamar Blinks, MD

## 2018-10-12 NOTE — Telephone Encounter (Signed)
Copied from [place] [id number]. Topic: Appointment Scheduling - Scheduling Inquiry for Clinic >> Oct 12, 2018  9:09 AM Ahmed Prima L wrote: Reason for CRM: Patient said she has been coughing , sneezing and sore throat over one week now. Would like either something called in or a tele visit.

## 2018-10-12 NOTE — Telephone Encounter (Signed)
Virtual visit scheduled this morning at 11:30

## 2018-10-30 ENCOUNTER — Other Ambulatory Visit: Payer: Self-pay | Admitting: Family Medicine

## 2018-10-30 DIAGNOSIS — M79605 Pain in left leg: Secondary | ICD-10-CM

## 2018-10-30 DIAGNOSIS — M545 Low back pain, unspecified: Secondary | ICD-10-CM

## 2018-11-24 ENCOUNTER — Telehealth: Payer: Self-pay | Admitting: *Deleted

## 2018-11-24 NOTE — Progress Notes (Deleted)
Telehealth Visit  {Choose 1 Note Type (Telehealth Visit or Telephone Visit):[phone]}   Evaluation Performed:  Follow-up visit  This visit type was conducted due to national recommendations for restrictions regarding the COVID-19 Pandemic (e.g. social distancing).  This format is felt to be most appropriate for this patient at this time.  All issues noted in this document were discussed and addressed.  No physical exam was performed (except for noted visual exam findings with Video Visits).  Please refer to the patient's chart (MyChart message for video visits and phone note for telephone visits) for the patient's consent to telehealth for [facility].  Date:  11/24/2018   ID:  Destiny Ballard, DOB 1950/11/13, MRN [id number]  Patient Location:  ***  Provider location:   ***  PCP:  Darreld Mclean, MD  Cardiologist:  Servando Snare & Electrophysiologist:  None   Chief Complaint:  ***  History of Present Illness:    Destiny Ballard is a 68 y.o. female who presents via audio/video conferencing for a telehealth visit today.  Former patient of Dr. Susa Simmonds and Dr. Claris Gladden - has primarily followed with meover the past few years.   She has amedical history significant of hypertension, hyperlipidemia, diabetes mellitus, GERD, anxiety, morbid obesity,&CAD, s/p stent placement 11-04-2005.Her last cath was in 11-04-17 was stable. She has had a tendency towards medication non compliance.  Her husband "[name]" died right after Christmas in November 04, 2016. She has had lots of grief - chest pain - had intermediate Myoview and was recathed - this was stable. She has had a chronic cough and is followed with pulmonary as well. I last saw her in November - grief continued to play a significant role - cardiac status was felt to be ok. Sugars pretty high. Admitted to just wanting to "lay down and die" because she wants to be with [name] - still missing him terribly.   The patient {does/does not:200015} have  symptoms concerning for COVID-19 infection (fever, chills, cough, or new shortness of breath).   Seen today via ***. *** has consented for this visit.    Past Medical History:  Diagnosis Date  . Anxiety    Prior suicide attempt  . CAD (coronary artery disease)    a) s/p DES to LAD 07/2005 b) Last Myoview low risk 11/2011 showing small fixed apical perfusion defect (prior MI vs attenuation) but no ischemia - normal EF.  [name] Cervical spondylosis   . Coronary atherosclerosis 06/28/2008  . Depression   . Depression with anxiety   . Diabetes mellitus without complication ([place])   . GERD (gastroesophageal reflux disease)   . Hyperlipidemia   . Hypertension   . Insulin resistance   . Iron deficiency anemia   . Obesity    Past Surgical History:  Procedure Laterality Date  . BREAST ENHANCEMENT SURGERY    . CARDIAC CATHETERIZATION  06/17/2007   NORMAL. EF 60%  . CARDIAC CATHETERIZATION N/A 01/29/2016   Procedure: Left Heart Cath and Coronary Angiography;  Surgeon: Sherren Mocha, MD;  Location: [place] [facility];  Service: Cardiovascular;  Laterality: N/A;  . CERVICAL SPONDYLOSIS     SINGLE LEVEL FUSION  . CHILDBIRTH     X3  . CORONARY STENT PLACEMENT  07/2005   LEFT ANTERIOR DESCENDING  . FOREARM FRACTURE SURGERY  2008-11-04   hand and shoulder   . INCISION AND DRAINAGE BREAST ABSCESS  01/05/2012      . INCISION AND DRAINAGE PERIRECTAL ABSCESS N/A 02/18/2014   Procedure: IRRIGATION AND DEBRIDEMENT  PERIRECTAL ABSCESS;  Surgeon: Pedro Earls, MD;  Location: [facility];  Service: General;  Laterality: N/A;  . LEFT HEART CATH AND CORONARY ANGIOGRAPHY N/A 10/10/2017   Procedure: LEFT HEART CATH AND CORONARY ANGIOGRAPHY;  Surgeon: Burnell Blanks, MD;  Location: [place] [facility];  Service: Cardiovascular;  Laterality: N/A;  . LUMBAR LAMINECTOMY    . ROTATOR CUFF REPAIR     bilaterla  . TONSILLECTOMY AND ADENOIDECTOMY    . TUBAL LIGATION    . VIDEO BRONCHOSCOPY Bilateral 08/13/2016    Procedure: VIDEO BRONCHOSCOPY WITHOUT FLUORO;  Surgeon: Collene Gobble, MD;  [facility];  Service: Cardiopulmonary;  Laterality: Bilateral;     No outpatient medications have been marked as taking for the 11/25/18 encounter (Appointment) with Burtis Junes, NP.     Allergies:   Prednisone   Social History   Tobacco Use  . Smoking status: Passive Smoke Exposure - Never Smoker  . Smokeless tobacco: Never Used  Substance Use Topics  . Alcohol use: Yes    Comment: occ  . Drug use: No     Family Hx: The patient's family history includes Allergies in an other family member; Asthma in her daughter and mother; Cancer in her daughter and sister; Cervical cancer in her daughter; Diabetes in her mother and sister; Heart attack in her mother; Heart disease in her mother; Lung cancer in her mother; Suicidality in her father.  ROS:   Please see the history of present illness.   All other systems reviewed are negative except for ***.    Objective:    Vital Signs:  There were no vitals taken for this visit.   Wt Readings from Last 3 Encounters:  08/22/18 172 lb (78 kg)  08/03/18 172 lb (78 kg)  05/19/18 175 lb (79.4 kg)    Alert female in no acute distress.   Labs/Other Tests and Data Reviewed:    Lab Results  Component Value Date   WBC 12.4 (H) 08/22/2018   HGB 11.9 (L) 08/22/2018   HCT 38.9 08/22/2018   PLT 334 08/22/2018   GLUCOSE 273 (H) 08/22/2018   CHOL 200 (H) 05/19/2018   TRIG 609 (HH) 05/19/2018   HDL 37 (L) 05/19/2018   LDLDIRECT 100.0 04/03/2017   LDLCALC Comment 05/19/2018   ALT 18 08/22/2018   AST 21 08/22/2018   NA 136 08/22/2018   K 3.6 08/22/2018   CL 99 08/22/2018   CREATININE 1.03 (H) 08/22/2018   BUN 20 08/22/2018   CO2 24 08/22/2018   TSH 2.361 01/27/2016   INR 0.9 10/06/2017   HGBA1C 9.2 (H) 08/03/2018   MICROALBUR 7.8 (H) 01/05/2018     BNP (last 3 results) No results for input(s): BNP in the last 8760 hours.  ProBNP (last 3  results) No results for input(s): PROBNP in the last 8760 hours.    Prior CV studies:    The following studies were reviewed today:  Cardiac CathConclusion3/2019    Prox RCA lesion is 20% stenosed.  Dist RCA lesion is 20% stenosed.  Ost 2nd Mrg to 2nd Mrg lesion is 40% stenosed.  Prox Cx lesion is 20% stenosed.  Ost LAD to Prox LAD lesion is 10% stenosed.  Prox LAD to Dist LAD lesion is 20% stenosed.  1. Patent stent mid LAD with minimal restenosis.  2. Mild non-obstructive disease in the RCA, Circumflex and distal LAD  Recommendations: Continue medical management of CAD.      MyoviewStudy Highlights2/2019  Nuclear stress EF: 64%.  There was no ST segment deviation noted during stress.  Findings consistent with ischemia.  This is an intermediate risk study.  The left ventricular ejection fraction is normal (55-65%).  1. EF 64%, normal wall motion.  2. Partially reversible medium-sized, moderate intensity mid to apical anteroseptal and apical anterior perfusion defect. This is concerning for ischemia.   Intermediate risk study.      EchoStudy Conclusions2/2019  - Left ventricle: The cavity size was normal. There was moderate concentric hypertrophy. Systolic function was normal. The estimated ejection fraction was in the range of 60% to 65%. Wall motion was normal; there were no regional wall motion abnormalities. There was an increased relative contribution of atrial contraction to ventricular filling. Doppler parameters are consistent with abnormal left ventricular relaxation (grade 1 diastolic dysfunction). - Aortic valve: Valve area (VTI): 1.97 cm^2. Valve area (Vmax): 1.67 cm^2. Valve area (Vmean): 1.71 cm^2. - Left atrium: The atrium was mildly dilated. - Pulmonary arteries: Systolic pressure could not be accurately estimated.   ASSESSMENT & PLAN:    1.CAD - prior stent in 2007 - s/p cath from  09/2017 which was stable - she is to continued with CV risk factor modification - she really has no desire to follow thru - her grief continues to be the crux of her issues.   2.HTN:recheck by me is ok - no changes made today.   3.GERD: -on chronic PPItherapy - not discussed.   4.DM-II:getting worse - followed by PCP - she really does not seem concerned about this.   5.HLD- on multiple agents.Needs lab today.   6.Chronic cough and chronic mild SOB: not really noted today.  7. Situational stress/grief - this still seems to be the crux of her issues. She has started doing some animal rehab/rescue - this may help. Very sad situation that I do not see changing.   1.    . COVID-19 Education: The signs and symptoms of COVID-19 were discussed with the patient and how to seek care for testing (follow up with PCP or arrange E-visit).  The importance of social distancing, staying at home, hand hygiene and wearing a mask when out in public were discussed today.  Patient Risk:   After full review of this patient's clinical status, I feel that they are at least moderate risk at this time.  Time:   Today, I have spent *** minutes with the patient with telehealth technology discussing the above issues.     Medication Adjustments/Labs and Tests Ordered: Current medicines are reviewed at length with the patient today.  Concerns regarding medicines are outlined above.   Tests Ordered: No orders of the defined types were placed in this encounter.   Medication Changes: No orders of the defined types were placed in this encounter.   Disposition:  FU with *** in {gen number 5-97:416384} {Days to years:10300}.   Patient is agreeable to this plan and will call if any problems develop in the interim.   Amie Critchley, NP  11/24/2018 4:42 PM    [facility]

## 2018-11-24 NOTE — Telephone Encounter (Signed)
lvm with instructions for telehealth visit, Wednesday, May 13 with Truitt Merle, NP.  Will need consent day of appt.  Pt was instructed to R/S is cannot keep appt.

## 2018-11-25 ENCOUNTER — Telehealth: Payer: Medicare Other | Admitting: Nurse Practitioner

## 2018-11-25 NOTE — Telephone Encounter (Signed)
Follow Up:    Pt just called and said she will not be available today. She said she wanted to wait until she can come back in the office.

## 2018-11-25 NOTE — Telephone Encounter (Signed)
S/w pt cancelled telehealth visit for today, pt wanted ov.  Made pt appt for June 22.

## 2018-11-26 ENCOUNTER — Encounter: Payer: Self-pay | Admitting: Family Medicine

## 2018-11-26 ENCOUNTER — Other Ambulatory Visit: Payer: Self-pay

## 2018-11-26 ENCOUNTER — Ambulatory Visit: Payer: Self-pay

## 2018-11-26 ENCOUNTER — Ambulatory Visit (INDEPENDENT_AMBULATORY_CARE_PROVIDER_SITE_OTHER): Payer: Medicare Other | Admitting: Family Medicine

## 2018-11-26 DIAGNOSIS — L299 Pruritus, unspecified: Secondary | ICD-10-CM | POA: Diagnosis not present

## 2018-11-26 MED ORDER — TRIAMCINOLONE ACETONIDE 0.1 % EX CREA: 1.0000 "application " | TOPICAL_CREAM | Freq: Two times a day (BID) | CUTANEOUS | 0 refills | Status: DC

## 2018-11-26 MED ORDER — HYDROXYZINE HCL 25 MG PO TABS: 12.5000 mg | ORAL_TABLET | Freq: Three times a day (TID) | ORAL | 0 refills | Status: DC | PRN

## 2018-11-26 NOTE — Patient Instructions (Addendum)
It was good to talk to you today, I am sorry that you seem to have poison ivy.  I hope that this gets better soon, let me know if not  I am still a bit confused about your insulin use.  I do not see a recent prescription of this medication from my office, although certainly I could have missed it.  Your [facility] and [place] pharmac[facility] are not feeling this medicine.  Are you getting this from your mail away pharmacy?  In any case, we should repeat an A1c in the next few months  Please come and see me when it is feasible to do so, perhaps in August.

## 2018-11-26 NOTE — Telephone Encounter (Signed)
Pt. Reports she broke out in poison ivy rash last week. Rash is blisters that weep. On her neck, face - nose, mouth,ears. Itching is severe. Reports she has had this in the past. Requests virtual visit. Spoke with Gwen in the practice and she will call pt. Back for virtual visit.  Answer Assessment - Initial Assessment Questions 1. APPEARANCE of RASH: "Describe the rash."      Blisters - small, weeping 2. LOCATION: "Where is the rash located?"      Face, neck, ears,mouth,nose 3. SIZE: "How large is the rash?"      Starts small 4. ONSET: "When did the rash begin?"      Started 1 week ago 5. ITCHING: "Does the rash itch?" If so, ask: "How bad is it?"   - MILD - doesn't interfere with normal activities   - MODERATE - SEVERE: interferes with work, school, sleep, or other activities      Severe 6. PREGNANCY: "Is there any chance you are pregnant?" "When was your last menstrual period?"     No  Protocols used: POISON IVY - OAK - SUMAC-A-AH

## 2018-11-26 NOTE — Progress Notes (Signed)
[place] at [facility] [address] [place], [place] [address] [phone]  Date:  11/26/2018   Name:  Destiny Ballard   DOB:  08/28/1950   MRN:  [id number]  PCP:  Darreld Mclean, MD    Chief Complaint: No chief complaint on file.   History of Present Illness:  Destiny Ballard is a 68 y.o. very pleasant female patient who presents with the following:  Virtual visit today due to pandemic Patient location is home, provider location is office Patient identity confirmed with 2 identifiers, she gives consent for virtual visit today  [name] has history of diabetes, CAD, CHF, COPD, hypertension, dyslipidemia  Lab Results  Component Value Date   HGBA1C 9.2 (H) 08/03/2018   She was recently trying to rescue 2 baby squirrels; they have been living in a tree outside her house, and she also brings in inside to feed him.  She has been letting them run around on her face and neck.  She thinks this is how she was exposed to PI -there is poison ivy near the tree but the squirrels like to climb She broke out in a PI rash about a week ago- She is using calomine lotion but it is itching quite a bit It is really just on her face and neck, some on her arms and shoulder  The rash is not getting better yet, and the itching is really annoying.  However she is actually still letting the squirrels run on her body, so she may be getting reexposed to the poison ivy oil  She has been checking her glucose at home- is running about 160, under 200- this depends some on her diet   She is using metformin farxiga increased from 5- 10 after last A1c result Also on 26 units of levemir per her report- I was not aware that she was still taking this, I do not think I have been prescribing it The last rx for this drug is from 2017 in my computer I [facility] and [facility], and they also do not have a recent prescription.  She has this  medication her refrigerator, but I am not sure how long she is have it Patient Active Problem List   Diagnosis Date Noted  . Lobar pneumonia, unspecified organism ([place]) 11/13/2017  . Abnormal cardiovascular stress test   . Precordial chest pain   . Sepsis ([place]) 09/18/2017  . Chronic diastolic CHF (congestive heart failure) ([place]) 09/18/2017  . Acute respiratory failure with hypoxia ([place]) 09/18/2017  . Diabetes mellitus ([place]) 09/08/2017  . Left hip pain 10/28/2016  . Chronic rhinitis 08/07/2016  . COPD with acute exacerbation ([place]) 04/25/2016  . Essential hypertension 01/30/2016  . High blood triglycerides   . Hypokalemia 01/27/2016  . Depression with anxiety   . CAD (coronary artery disease)   . HLD (hyperlipidemia)   . Type 2 diabetes mellitus without complication, without long-term current use of insulin ([place])   . OSA (obstructive sleep apnea) 05/06/2014  . Dyspnea on exertion 09/02/2012  . Chest pain 12/10/2011  . Chronic cough 05/11/2010  . Obesity (BMI 30-39.9)   . GERD 06/28/2008    Past Medical History:  Diagnosis Date  . Anxiety    Prior suicide attempt  . CAD (coronary artery disease)    a) s/p DES to LAD 07/2005 b) Last Myoview low risk 11/2011 showing small fixed apical perfusion defect (prior MI vs attenuation)  but no ischemia - normal EF.  [name] Cervical spondylosis   . Coronary atherosclerosis 06/28/2008  . Depression   . Depression with anxiety   . Diabetes mellitus without complication ([place])   . GERD (gastroesophageal reflux disease)   . Hyperlipidemia   . Hypertension   . Insulin resistance   . Iron deficiency anemia   . Obesity     Past Surgical History:  Procedure Laterality Date  . BREAST ENHANCEMENT SURGERY    . CARDIAC CATHETERIZATION  06/17/2007   NORMAL. EF 60%  . CARDIAC CATHETERIZATION N/A 01/29/2016   Procedure: Left Heart Cath and Coronary Angiography;  Surgeon: Sherren Mocha, MD;  Location: [place] [facility];  Service: Cardiovascular;   Laterality: N/A;  . CERVICAL SPONDYLOSIS     SINGLE LEVEL FUSION  . CHILDBIRTH     X3  . CORONARY STENT PLACEMENT  07/2005   LEFT ANTERIOR DESCENDING  . FOREARM FRACTURE SURGERY  2010   hand and shoulder   . INCISION AND DRAINAGE BREAST ABSCESS  01/05/2012      . INCISION AND DRAINAGE PERIRECTAL ABSCESS N/A 02/18/2014   Procedure: IRRIGATION AND DEBRIDEMENT PERIRECTAL ABSCESS;  Surgeon: Pedro Earls, MD;  Location: [facility];  Service: General;  Laterality: N/A;  . LEFT HEART CATH AND CORONARY ANGIOGRAPHY N/A 10/10/2017   Procedure: LEFT HEART CATH AND CORONARY ANGIOGRAPHY;  Surgeon: Burnell Blanks, MD;  Location: [place] [facility];  Service: Cardiovascular;  Laterality: N/A;  . LUMBAR LAMINECTOMY    . ROTATOR CUFF REPAIR     bilaterla  . TONSILLECTOMY AND ADENOIDECTOMY    . TUBAL LIGATION    . VIDEO BRONCHOSCOPY Bilateral 08/13/2016   Procedure: VIDEO BRONCHOSCOPY WITHOUT FLUORO;  Surgeon: Collene Gobble, MD;  [facility];  Service: Cardiopulmonary;  Laterality: Bilateral;    Social History   Tobacco Use  . Smoking status: Passive Smoke Exposure - Never Smoker  . Smokeless tobacco: Never Used  Substance Use Topics  . Alcohol use: Yes    Comment: occ  . Drug use: No    Family History  Problem Relation Age of Onset  . Heart attack Mother   . Diabetes Mother   . Lung cancer Mother   . Asthma Mother   . Heart disease Mother   . Suicidality Father        "killed himself"  . Asthma Daughter        x 2  . Cancer Daughter        pre-cancerous polyp  . Diabetes Sister   . Cancer Sister   . Cervical cancer Daughter        cervical   . Allergies Other        all family--seasonal allergies    Allergies  Allergen Reactions  . Prednisone Other (See Comments)    REACTION: mood swings, nightmares. "Shot doesn't bother me, reaction is just with the pill" she states she has had the steroid injections before. From our records methylprednisone was given to her  in 2013 without any complications.    Medication list has been reviewed and updated.  Current Outpatient Medications on File Prior to Visit  Medication Sig Dispense Refill  . albuterol (PROAIR HFA) 108 (90 Base) MCG/ACT inhaler Inhale 2 puffs into the lungs every 4 (four) hours as needed for wheezing or shortness of breath. 3 Inhaler 1  . albuterol (PROVENTIL) (2.5 MG/3ML) 0.083% nebulizer solution Take 3 mLs (2.5 mg total) by nebulization every 4 (four) hours as  needed for wheezing or shortness of breath. 75 mL 12  . aspirin EC 81 MG tablet Take 81 mg by mouth daily.    . benzonatate (TESSALON) 200 MG capsule Take 1 capsule (200 mg total) by mouth 3 (three) times daily as needed for cough. 60 capsule 0  . calcium carbonate (TUMS - DOSED IN MG ELEMENTAL CALCIUM) 500 MG chewable tablet Chew 2 tablets by mouth daily as needed for indigestion or heartburn.    . cetirizine (ZYRTEC) 10 MG tablet TAKE 1 TABLET BY MOUTH DAILY AS NEEDED FOR ALLERGIES 90 tablet 1  . Cholecalciferol (VITAMIN D3) 1000 UNITS CAPS Take 1,000 Units by mouth daily.     . clonazePAM (KLONOPIN) 0.5 MG tablet TAKE 0.5 TABLETS (0.25 MG TOTAL) BY MOUTH 2 TIMES DAILY AS NEEDED FOR ANXIETY 20 tablet 1  . dapagliflozin propanediol (FARXIGA) 10 MG TABS tablet Take 10 mg by mouth daily. 90 tablet 3  . doxycycline (VIBRAMYCIN) 100 MG capsule Take 1 capsule (100 mg total) by mouth 2 (two) times daily. 20 capsule 0  . FLUoxetine (PROZAC) 40 MG capsule Take 1 capsule (40 mg total) by mouth daily. 90 capsule 3  . fluticasone (FLONASE) 50 MCG/ACT nasal spray SPRAY 2 SPRAYS INTO EACH NOSTRIL EVERY DAY 48 g 0  . furosemide (LASIX) 20 MG tablet TAKE ONE TABLET DAILY IF NEEDED FOR LEG SWELLING 30 tablet 1  . gabapentin (NEURONTIN) 300 MG capsule Take 600 mg by mouth 2 (two) times daily.     . hydrALAZINE (APRESOLINE) 25 MG tablet Take 2 tablets (50 mg total) by mouth 3 (three) times daily. 180 tablet 11  . HYDROcodone-acetaminophen  (NORCO/VICODIN) 5-325 MG tablet Take 1-2 tablets by mouth every 6 (six) hours as needed for moderate pain or severe pain. DO NOT TAKE WITH ATIVAN, XANAX, OR VALUUM 20 tablet 0  . hydrOXYzine (ATARAX/VISTARIL) 25 MG tablet TAKE 1 TABLET BY MOUTH EVERYDAY AT BEDTIME 90 tablet 1  . ibuprofen (ADVIL,MOTRIN) 200 MG tablet Take 800 mg by mouth every 6 (six) hours as needed for headache or moderate pain.     . Insulin Detemir (LEVEMIR FLEXTOUCH) 100 UNIT/ML Pen Inject 10 Units into the skin daily. 15 mL 3  . ipratropium (ATROVENT) 0.03 % nasal spray Place 2 sprays into the nose 4 (four) times daily. 30 mL 6  . KLOR-CON 10 10 MEQ tablet TAKE 1 TABLET (10 MEQ TOTAL) BY MOUTH DAILY. TAKE ONLY ON DAYS THAT YOU USE LASIX/ FUROSEMIDE 90 tablet 1  . losartan (COZAAR) 100 MG tablet Take 1 tablet (100 mg total) by mouth daily. 90 tablet 3  . lovastatin (MEVACOR) 20 MG tablet TAKE 1 TABLET BY MOUTH EVERYDAY AT BEDTIME 90 tablet 1  . metFORMIN (GLUCOPHAGE-XR) 500 MG 24 hr tablet TAKE 2 TABLETS BY MOUTH TWICE A DAY 360 tablet 0  . methocarbamol (ROBAXIN) 500 MG tablet Take 1 tablet (500 mg total) by mouth 2 (two) times daily. 20 tablet 0  . methocarbamol (ROBAXIN) 750 MG tablet Take 1 tablet (750 mg total) by mouth every 12 (twelve) hours as needed for muscle spasms. 60 tablet 3  . metoprolol succinate (TOPROL-XL) 100 MG 24 hr tablet TAKE 1 TABLET BY MOUTH EVERY DAY 90 tablet 3  . montelukast (SINGULAIR) 10 MG tablet Take 1 tablet (10 mg total) by mouth at bedtime. Use as needed for allergies 90 tablet 1  . nitroGLYCERIN (NITROSTAT) 0.4 MG SL tablet Place 1 tablet (0.4 mg total) under the tongue every 5 (five) minutes  as needed for chest pain. 20 tablet 3  . omega-3 acid ethyl esters (LOVAZA) 1 g capsule TAKE 1 CAPSULE (1 G TOTAL) BY MOUTH 2 (TWO) TIMES DAILY. 180 capsule 1  . ondansetron (ZOFRAN ODT) 4 MG disintegrating tablet 4mg  ODT q4 hours prn nausea/vomit 20 tablet 0  . ondansetron (ZOFRAN ODT) 4 MG  disintegrating tablet Take 1 tablet (4 mg total) by mouth every 8 (eight) hours as needed for nausea or vomiting. 20 tablet 0  . polyethylene glycol (MIRALAX / GLYCOLAX) packet Take 17 g by mouth daily. 14 each 0  . Polyvinyl Alcohol-Povidone (REFRESH OP) Place 2 drops into both eyes daily as needed (for dry eyes).    . Tiotropium Bromide Monohydrate (SPIRIVA RESPIMAT) 2.5 MCG/ACT AERS Inhale 2 puffs into the lungs daily. 1 Inhaler 11  . traZODone (DESYREL) 50 MG tablet TAKE 1/2-1 TABLET BY MOUTH AT BEDTIME AS NEEDED FOR SLEEP 90 tablet 1   No current facility-administered medications on file prior to visit.     Review of Systems:  As per HPI- otherwise negative. No fever chills  Physical Examination: There were no vitals filed for this visit. There were no vitals filed for this visit. There is no height or weight on file to calculate BMI. Ideal Body Weight:    Patient seen on video today.  She looks well, her normal self.  She does have some mild skin inflammation consistent with rhus dermatitis on her face.  However no swelling of her eyes, lips or mouth is observed. She actually has the baby squirrel with her, it is very cute  Assessment and Plan: Itching - Plan: hydrOXYzine (ATARAX/VISTARIL) 25 MG tablet   [name] called in today with concern of poison ivy on her face and neck.  She thinks she got this from a baby squirrel that she has rescued from outside.  The squirrel is going in and out of the house, and she is allowing it to run over her face and neck.  I advised her that if the squirrel is contacting poison ivy, will continue to get the oil on her if she allows it to crawl on her body.  She has history of poorly controlled diabetes, I do not want to start her on a lengthy course of prednisone for Rhus dermatitis.  She understands my reasoning.  Her main issue right now is itching. I will prescribe hydroxyzine to use as needed for itching, she has used this in the past  without difficulty.  Also will let her use a conservative course of topical triamcinolone.  Reviewed her allergies.  She is not actually allergic to prednisone, but the oral medication has caused mood swings in the past  As above, I was not clear that she is still using insulin.  We will ask her to come in for an in person visit as soon as is feasible  Signed Lamar Blinks, MD

## 2018-11-26 NOTE — Telephone Encounter (Signed)
Virtual visit scheduled with provider today.

## 2018-12-02 ENCOUNTER — Other Ambulatory Visit: Payer: Self-pay | Admitting: *Deleted

## 2018-12-03 ENCOUNTER — Other Ambulatory Visit: Payer: Self-pay | Admitting: Family Medicine

## 2018-12-03 DIAGNOSIS — L299 Pruritus, unspecified: Secondary | ICD-10-CM

## 2018-12-04 ENCOUNTER — Other Ambulatory Visit: Payer: Self-pay

## 2018-12-04 ENCOUNTER — Other Ambulatory Visit: Payer: Self-pay | Admitting: Endocrinology

## 2018-12-04 ENCOUNTER — Other Ambulatory Visit: Payer: Self-pay | Admitting: Family Medicine

## 2018-12-04 DIAGNOSIS — IMO0002 Reserved for concepts with insufficient information to code with codable children: Secondary | ICD-10-CM

## 2018-12-04 DIAGNOSIS — R05 Cough: Secondary | ICD-10-CM

## 2018-12-04 DIAGNOSIS — E118 Type 2 diabetes mellitus with unspecified complications: Secondary | ICD-10-CM

## 2018-12-04 DIAGNOSIS — E785 Hyperlipidemia, unspecified: Secondary | ICD-10-CM

## 2018-12-04 DIAGNOSIS — R059 Cough, unspecified: Secondary | ICD-10-CM

## 2018-12-04 DIAGNOSIS — E1165 Type 2 diabetes mellitus with hyperglycemia: Secondary | ICD-10-CM

## 2018-12-04 MED ORDER — LOVASTATIN 20 MG PO TABS: ORAL_TABLET | ORAL | 1 refills | Status: DC

## 2018-12-08 ENCOUNTER — Telehealth: Payer: Self-pay | Admitting: Family Medicine

## 2018-12-08 NOTE — Telephone Encounter (Signed)
LMOVM for pt to call office back to schedule an appt per her request. °

## 2018-12-09 ENCOUNTER — Other Ambulatory Visit: Payer: Self-pay | Admitting: *Deleted

## 2018-12-15 ENCOUNTER — Other Ambulatory Visit: Payer: Self-pay | Admitting: Family Medicine

## 2018-12-15 DIAGNOSIS — R059 Cough, unspecified: Secondary | ICD-10-CM

## 2018-12-15 DIAGNOSIS — R05 Cough: Secondary | ICD-10-CM

## 2018-12-15 MED ORDER — GABAPENTIN 300 MG PO CAPS: 600.0000 mg | ORAL_CAPSULE | Freq: Two times a day (BID) | ORAL | 1 refills | Status: DC

## 2018-12-15 MED ORDER — BENZONATATE 200 MG PO CAPS: ORAL_CAPSULE | ORAL | 5 refills | Status: DC

## 2018-12-15 NOTE — Telephone Encounter (Signed)
Refills sent to pharmacy. 

## 2018-12-15 NOTE — Telephone Encounter (Signed)
Copied from [place] [id number]. Topic: Quick Communication - Rx Refill/Question >> Dec 15, 2018  9:52 AM Virl Axe D wrote: Medication:benzonatate (TESSALON) 200 MG capsule/gabapentin (NEURONTIN) 300 MG capsule   Has the patient contacted their pharmacy? Yes.   (Agent: If no, request that the patient contact the pharmacy for the refill.) (Agent: If yes, when and what did the pharmacy advise?)  Preferred Pharmacy (with phone number or street name): [id number] [address], [place] - [place] [phone] (Phone) [phone] (Fax)    Agent: Please be advised that RX refills may take up to 3 business days. We ask that you follow-up with your pharmacy.

## 2018-12-23 ENCOUNTER — Other Ambulatory Visit: Payer: Self-pay | Admitting: Family Medicine

## 2018-12-23 DIAGNOSIS — F32A Depression, unspecified: Secondary | ICD-10-CM

## 2018-12-23 DIAGNOSIS — F329 Major depressive disorder, single episode, unspecified: Secondary | ICD-10-CM

## 2018-12-23 MED ORDER — FLUOXETINE HCL 40 MG PO CAPS: 40.0000 mg | ORAL_CAPSULE | Freq: Every day | ORAL | 3 refills | Status: DC

## 2018-12-24 ENCOUNTER — Other Ambulatory Visit: Payer: Self-pay | Admitting: *Deleted

## 2018-12-24 ENCOUNTER — Ambulatory Visit: Payer: Self-pay | Admitting: Family Medicine

## 2018-12-24 ENCOUNTER — Emergency Department (HOSPITAL_COMMUNITY): Payer: Medicare Other

## 2018-12-24 ENCOUNTER — Other Ambulatory Visit: Payer: Self-pay

## 2018-12-24 ENCOUNTER — Encounter (HOSPITAL_COMMUNITY): Payer: Self-pay | Admitting: Radiology

## 2018-12-24 ENCOUNTER — Emergency Department (HOSPITAL_COMMUNITY)
Admission: EM | Admit: 2018-12-24 | Discharge: 2018-12-24 | Disposition: A | Discharge status: Home / Self Care | Payer: Medicare Other | Attending: Emergency Medicine | Admitting: Emergency Medicine

## 2018-12-24 DIAGNOSIS — I2510 Atherosclerotic heart disease of native coronary artery without angina pectoris: Secondary | ICD-10-CM | POA: Diagnosis not present

## 2018-12-24 DIAGNOSIS — I5032 Chronic diastolic (congestive) heart failure: Secondary | ICD-10-CM | POA: Insufficient documentation

## 2018-12-24 DIAGNOSIS — R0602 Shortness of breath: Secondary | ICD-10-CM | POA: Diagnosis not present

## 2018-12-24 DIAGNOSIS — Z79899 Other long term (current) drug therapy: Secondary | ICD-10-CM | POA: Diagnosis not present

## 2018-12-24 DIAGNOSIS — Z7982 Long term (current) use of aspirin: Secondary | ICD-10-CM | POA: Diagnosis not present

## 2018-12-24 DIAGNOSIS — E119 Type 2 diabetes mellitus without complications: Secondary | ICD-10-CM | POA: Insufficient documentation

## 2018-12-24 DIAGNOSIS — R000 Tachycardia, unspecified: Secondary | ICD-10-CM | POA: Diagnosis not present

## 2018-12-24 DIAGNOSIS — I10 Essential (primary) hypertension: Secondary | ICD-10-CM | POA: Diagnosis not present

## 2018-12-24 DIAGNOSIS — Z794 Long term (current) use of insulin: Secondary | ICD-10-CM | POA: Diagnosis not present

## 2018-12-24 DIAGNOSIS — I110 Hypertensive heart disease with heart failure: Secondary | ICD-10-CM | POA: Diagnosis not present

## 2018-12-24 DIAGNOSIS — R079 Chest pain, unspecified: Secondary | ICD-10-CM | POA: Diagnosis not present

## 2018-12-24 LAB — COMPREHENSIVE METABOLIC PANEL
ALT: 15 U/L (ref 0–44)
AST: 18 U/L (ref 15–41)
Albumin: 3.5 g/dL (ref 3.5–5.0)
Alkaline Phosphatase: 84 U/L (ref 38–126)
Anion gap: 14 (ref 5–15)
BUN: 14 mg/dL (ref 8–23)
CO2: 24 mmol/L (ref 22–32)
Calcium: 9.5 mg/dL (ref 8.9–10.3)
Chloride: 100 mmol/L (ref 98–111)
Creatinine, Ser: 0.81 mg/dL (ref 0.44–1.00)
GFR calc Af Amer: >60 mL/min (ref >60)
GFR calc non Af Amer: >60 mL/min (ref >60)
Glucose, Bld: 261 mg/dL — ABNORMAL HIGH (ref 70–99)
Potassium: 3.5 mmol/L (ref 3.5–5.1)
Sodium: 138 mmol/L (ref 135–145)
Total Bilirubin: 0.4 mg/dL (ref 0.3–1.2)
Total Protein: 7.3 g/dL (ref 6.5–8.1)

## 2018-12-24 LAB — URINALYSIS, ROUTINE W REFLEX MICROSCOPIC
Bacteria, UA: NONE SEEN
Bilirubin Urine: NEGATIVE
Glucose, UA: >=500 mg/dL — AB
Hgb urine dipstick: NEGATIVE
Ketones, ur: NEGATIVE mg/dL
Nitrite: NEGATIVE
Protein, ur: NEGATIVE mg/dL
Specific Gravity, Urine: 1.026 (ref 1.005–1.030)
pH: 7 (ref 5.0–8.0)

## 2018-12-24 LAB — CBC WITH DIFFERENTIAL/PLATELET
Abs Immature Granulocytes: 0.08 10*3/uL — ABNORMAL HIGH (ref 0.00–0.07)
Basophils Absolute: 0 10*3/uL (ref 0.0–0.1)
Basophils Relative: 0 %
Eosinophils Absolute: 0.6 10*3/uL — ABNORMAL HIGH (ref 0.0–0.5)
Eosinophils Relative: 5 %
HCT: 42.4 % (ref 36.0–46.0)
Hemoglobin: 13.2 g/dL (ref 12.0–15.0)
Immature Granulocytes: 1 %
Lymphocytes Relative: 19 %
Lymphs Abs: 2.6 10*3/uL (ref 0.7–4.0)
MCH: 25 pg — ABNORMAL LOW (ref 26.0–34.0)
MCHC: 31.1 g/dL (ref 30.0–36.0)
MCV: 80.5 fL (ref 80.0–100.0)
Monocytes Absolute: 1 10*3/uL (ref 0.1–1.0)
Monocytes Relative: 7 %
Neutro Abs: 9.6 10*3/uL — ABNORMAL HIGH (ref 1.7–7.7)
Neutrophils Relative %: 68 %
Platelets: 291 10*3/uL (ref 150–400)
RBC: 5.27 MIL/uL — ABNORMAL HIGH (ref 3.87–5.11)
RDW: 13.5 % (ref 11.5–15.5)
WBC: 13.9 10*3/uL — ABNORMAL HIGH (ref 4.0–10.5)
nRBC: 0 % (ref 0.0–0.2)

## 2018-12-24 LAB — BRAIN NATRIURETIC PEPTIDE: B Natriuretic Peptide: 25.9 pg/mL (ref 0.0–100.0)

## 2018-12-24 LAB — TROPONIN I
Troponin I: <0.03 ng/mL (ref <0.03)
Troponin I: <0.03 ng/mL (ref <0.03)

## 2018-12-24 LAB — D-DIMER, QUANTITATIVE: D-Dimer, Quant: 1 ug/mL-FEU — ABNORMAL HIGH (ref 0.00–0.50)

## 2018-12-24 MED ORDER — IOHEXOL 350 MG/ML SOLN
75.0000 mL | Freq: Once | INTRAVENOUS | Status: AC | PRN
  Administered 2018-12-24: 75 mL via INTRAVENOUS

## 2018-12-24 NOTE — Telephone Encounter (Signed)
Just an FYI

## 2018-12-24 NOTE — Telephone Encounter (Signed)
  Pt called in c/o rapid heart rate, elevated BP, sweating, feels real bad and it's not going away.  Has cardiac history with stent insertion in 2007.  I instructed her to call 911. "I need to let my daughter know first".   I let her know to not put off getting to the hospital.  She said,  "OK".  I sent these notes to Dr Janett Billow Copland's office so she would be aware of the ED referral.  Reason for Disposition . [1] Heart beating very rapidly (e.g., > 140 / minute) AND [2] present now  (Exception: during exercise)  Answer Assessment - Initial Assessment Questions 1. DESCRIPTION: "Please describe your heart rate or heart beat that you are having" (e.g., fast/slow, regular/irregular, skipped or extra beats, "palpitations")     My glucose is 205.  Now.   My heart is beating fast.  I'm sweating.   My Bp 185/107  Pulse 122  I've taken a nitroglycerin at 11:00 AM.  I 2. ONSET: "When did it start?" (Minutes, hours or days)      I've not felt good all morning. 3. DURATION: "How long does it last" (e.g., seconds, minutes, hours)     Constant 4. PATTERN "Does it come and go, or has it been constant since it started?"  "Does it get worse with exertion?"   "Are you feeling it now?"     *No Answer* 5. TAP: "Using your hand, can you tap out what you are feeling on a chair or table in front of you, so that I can hear?" (Note: not all patients can do this)       *No Answer* 6. HEART RATE: "Can you tell me your heart rate?" "How many beats in 15 seconds?"  (Note: not all patients can do this)       122 via BP machine just now. 7. RECURRENT SYMPTOM: "Have you ever had this before?" If so, ask: "When was the last time?" and "What happened that time?"      I had a stent put in in 2007. 8. CAUSE: "What do you think is causing the palpitations?"     I don't know thought it was my diabetes but it's not. 9. CARDIAC HISTORY: "Do you have any history of heart disease?" (e.g., heart attack, angina, bypass surgery,  angioplasty, arrhythmia)      Stent put in in 2007 10. OTHER SYMPTOMS: "Do you have any other symptoms?" (e.g., dizziness, chest pain, sweating, difficulty breathing)       Sweating, don't feel good,heart racing and BP elevated. 11. PREGNANCY: "Is there any chance you are pregnant?" "When was your last menstrual period?"       Not asked  Protocols used: [place]

## 2018-12-24 NOTE — ED Notes (Signed)
This RN acting as Art therapist and asked pt if she would like for me to reach out to any family/friends. Pt declined and states she is able to keep family informed.

## 2018-12-24 NOTE — ED Provider Notes (Signed)
[facility] EMERGENCY DEPARTMENT Provider Note   CSN: [id number] Arrival date & time: 12/24/18  1208     History   Chief Complaint Chief Complaint  Patient presents with  . Chest Pain  . Tachycardia    HPI Destiny Ballard is a 68 y.o. female.     The history is provided by the patient.  Chest Pain Pain location:  Substernal area Pain quality: aching   Pain radiates to:  Does not radiate Pain severity:  Mild Duration:  1 day Timing:  Constant Progression:  Unable to specify Chronicity:  New Relieved by:  Nothing Worsened by:  Nothing Ineffective treatments:  None tried Associated symptoms: no weakness   Risk factors: diabetes mellitus and hypertension   Pt reports she had tightness in her chest. Pt denies any cough or congestion.  Pt reports she lives a lone.  She has not had an covid exposures.   Past Medical History:  Diagnosis Date  . Anxiety    Prior suicide attempt  . CAD (coronary artery disease)    a) s/p DES to LAD 07/2005 b) Last Myoview low risk 11/2011 showing small fixed apical perfusion defect (prior MI vs attenuation) but no ischemia - normal EF.  [name] Cervical spondylosis   . Coronary atherosclerosis 06/28/2008  . Depression   . Depression with anxiety   . Diabetes mellitus without complication ([place])   . GERD (gastroesophageal reflux disease)   . Hyperlipidemia   . Hypertension   . Insulin resistance   . Iron deficiency anemia   . Obesity     Patient Active Problem List   Diagnosis Date Noted  . Lobar pneumonia, unspecified organism ([place]) 11/13/2017  . Abnormal cardiovascular stress test   . Precordial chest pain   . Sepsis ([place]) 09/18/2017  . Chronic diastolic CHF (congestive heart failure) ([place]) 09/18/2017  . Acute respiratory failure with hypoxia ([place]) 09/18/2017  . Diabetes mellitus ([place]) 09/08/2017  . Left hip pain 10/28/2016  . Chronic rhinitis 08/07/2016  . COPD with acute exacerbation ([place]) 04/25/2016  .  Essential hypertension 01/30/2016  . High blood triglycerides   . Hypokalemia 01/27/2016  . Depression with anxiety   . CAD (coronary artery disease)   . HLD (hyperlipidemia)   . Type 2 diabetes mellitus without complication, without long-term current use of insulin ([place])   . OSA (obstructive sleep apnea) 05/06/2014  . Dyspnea on exertion 09/02/2012  . Chest pain 12/10/2011  . Chronic cough 05/11/2010  . Obesity (BMI 30-39.9)   . GERD 06/28/2008    Past Surgical History:  Procedure Laterality Date  . BREAST ENHANCEMENT SURGERY    . CARDIAC CATHETERIZATION  06/17/2007   NORMAL. EF 60%  . CARDIAC CATHETERIZATION N/A 01/29/2016   Procedure: Left Heart Cath and Coronary Angiography;  Surgeon: Sherren Mocha, MD;  Location: [facility];  Service: Cardiovascular;  Laterality: N/A;  . CERVICAL SPONDYLOSIS     SINGLE LEVEL FUSION  . CHILDBIRTH     X3  . CORONARY STENT PLACEMENT  07/2005   LEFT ANTERIOR DESCENDING  . FOREARM FRACTURE SURGERY  2010   hand and shoulder   . INCISION AND DRAINAGE BREAST ABSCESS  01/05/2012      . INCISION AND DRAINAGE PERIRECTAL ABSCESS N/A 02/18/2014   Procedure: IRRIGATION AND DEBRIDEMENT PERIRECTAL ABSCESS;  Surgeon: Pedro Earls, MD;  Location: [facility];  Service: General;  Laterality: N/A;  . LEFT HEART CATH AND CORONARY ANGIOGRAPHY N/A 10/10/2017   Procedure: LEFT  HEART CATH AND CORONARY ANGIOGRAPHY;  Surgeon: Burnell Blanks, MD;  Location: [facility];  Service: Cardiovascular;  Laterality: N/A;  . LUMBAR LAMINECTOMY    . ROTATOR CUFF REPAIR     [place]  . TONSILLECTOMY AND ADENOIDECTOMY    . TUBAL LIGATION    . VIDEO BRONCHOSCOPY Bilateral 08/13/2016   Procedure: VIDEO BRONCHOSCOPY WITHOUT FLUORO;  Surgeon: Collene Gobble, MD;  Location: [facility];  Service: Cardiopulmonary;  Laterality: Bilateral;     OB History   No obstetric history on file.      Home Medications    Prior to Admission medications   Medication  Sig Start Date End Date Taking? Authorizing Provider  albuterol (PROAIR HFA) 108 (90 Base) MCG/ACT inhaler Inhale 2 puffs into the lungs every 4 (four) hours as needed for wheezing or shortness of breath. 09/15/17   Collene Gobble, MD  albuterol (PROVENTIL) (2.5 MG/3ML) 0.083% nebulizer solution Take 3 mLs (2.5 mg total) by nebulization every 4 (four) hours as needed for wheezing or shortness of breath. 08/03/18   Copland, Gay Filler, MD  aspirin EC 81 MG tablet Take 81 mg by mouth daily.    [provider]  benzonatate (TESSALON) 200 MG capsule TAKE 1 CAPSULE BY MOUTH 3 TIMES A DAY AS NEEDED FOR COUGH 12/15/18   Copland, Gay Filler, MD  calcium carbonate (TUMS - DOSED IN MG ELEMENTAL CALCIUM) 500 MG chewable tablet Chew 2 tablets by mouth daily as needed for indigestion or heartburn.    [provider]  cetirizine (ZYRTEC) 10 MG tablet TAKE 1 TABLET BY MOUTH DAILY AS NEEDED FOR ALLERGIES 12/16/17   Copland, Gay Filler, MD  Cholecalciferol (VITAMIN D3) 1000 UNITS CAPS Take 1,000 Units by mouth daily.     [provider]  clonazePAM (KLONOPIN) 0.5 MG tablet TAKE 0.5 TABLETS (0.25 MG TOTAL) BY MOUTH 2 TIMES DAILY AS NEEDED FOR ANXIETY 01/05/18   Copland, Gay Filler, MD  dapagliflozin propanediol (FARXIGA) 10 MG TABS tablet Take 10 mg by mouth daily. 08/03/18   Copland, Gay Filler, MD  FLUoxetine (PROZAC) 40 MG capsule Take 1 capsule (40 mg total) by mouth daily. 12/23/18   Copland, Gay Filler, MD  fluticasone (FLONASE) 50 MCG/ACT nasal spray SPRAY 2 SPRAYS INTO EACH NOSTRIL EVERY DAY 06/08/18   Collene Gobble, MD  furosemide (LASIX) 20 MG tablet TAKE ONE TABLET DAILY IF NEEDED FOR LEG SWELLING 08/25/17   Copland, Gay Filler, MD  gabapentin (NEURONTIN) 300 MG capsule Take 2 capsules (600 mg total) by mouth 2 (two) times daily. 12/15/18   Copland, Gay Filler, MD  hydrALAZINE (APRESOLINE) 25 MG tablet Take 2 tablets (50 mg total) by mouth 3 (three) times daily. 10/08/17   Burtis Junes, NP   HYDROcodone-acetaminophen (NORCO/VICODIN) 5-325 MG tablet Take 1-2 tablets by mouth every 6 (six) hours as needed for moderate pain or severe pain. DO NOT TAKE WITH ATIVAN, Duanne Moron, OR VALUUM 08/23/18   Duffy Bruce, MD  hydrOXYzine (ATARAX/VISTARIL) 25 MG tablet TAKE 0.5-1 TABLETS (12.5-25 MG TOTAL) BY MOUTH EVERY 8 (EIGHT) HOURS AS NEEDED FOR ITCHING. 12/03/18   Copland, Gay Filler, MD  ibuprofen (ADVIL,MOTRIN) 200 MG tablet Take 800 mg by mouth every 6 (six) hours as needed for headache or moderate pain.     [provider]  Insulin Detemir (LEVEMIR FLEXTOUCH) 100 UNIT/ML Pen Inject 10 Units into the skin daily. 01/30/16   Barton Dubois, MD  ipratropium (ATROVENT) 0.03 % nasal spray Place 2 sprays  into the nose 4 (four) times daily. 08/03/18   Copland, Gay Filler, MD  KLOR-CON 10 10 MEQ tablet TAKE 1 TABLET (10 MEQ TOTAL) BY MOUTH DAILY. TAKE ONLY ON DAYS THAT YOU USE LASIX/ FUROSEMIDE 04/01/18   Copland, Gay Filler, MD  losartan (COZAAR) 100 MG tablet Take 1 tablet (100 mg total) by mouth daily. 09/10/17 08/22/18  Burtis Junes, NP  lovastatin (MEVACOR) 20 MG tablet TAKE 1 TABLET BY MOUTH EVERYDAY AT BEDTIME 12/04/18   Copland, Gay Filler, MD  metFORMIN (GLUCOPHAGE-XR) 500 MG 24 hr tablet TAKE 2 TABLETS BY MOUTH TWICE A DAY 10/21/17   Renato Shin, MD  methocarbamol (ROBAXIN) 500 MG tablet Take 1 tablet (500 mg total) by mouth 2 (two) times daily. 02/19/18   Khatri, Hina, PA-C  methocarbamol (ROBAXIN) 750 MG tablet Take 1 tablet (750 mg total) by mouth every 12 (twelve) hours as needed for muscle spasms. 11/02/18   Copland, Gay Filler, MD  metoprolol succinate (TOPROL-XL) 100 MG 24 hr tablet TAKE 1 TABLET BY MOUTH EVERY DAY 09/21/18   Copland, Gay Filler, MD  montelukast (SINGULAIR) 10 MG tablet Take 1 tablet (10 mg total) by mouth at bedtime. Use as needed for allergies 10/12/18   Copland, Gay Filler, MD  nitroGLYCERIN (NITROSTAT) 0.4 MG SL tablet Place 1 tablet (0.4 mg total) under the tongue every 5  (five) minutes as needed for chest pain. 09/01/17   Copland, Gay Filler, MD  omega-3 acid ethyl esters (LOVAZA) 1 g capsule TAKE 1 CAPSULE (1 G TOTAL) BY MOUTH 2 (TWO) TIMES DAILY. 05/14/18   Burtis Junes, NP  ondansetron (ZOFRAN ODT) 4 MG disintegrating tablet 4mg  ODT q4 hours prn nausea/vomit 08/23/18   Duffy Bruce, MD  ondansetron (ZOFRAN ODT) 4 MG disintegrating tablet Take 1 tablet (4 mg total) by mouth every 8 (eight) hours as needed for nausea or vomiting. 08/23/18   Duffy Bruce, MD  polyethylene glycol [facility] Floria Raveling) packet Take 17 g by mouth daily. 02/19/18   Khatri, Hina, PA-C  Polyvinyl Alcohol-Povidone (REFRESH OP) Place 2 drops into both eyes daily as needed (for dry eyes).    [provider]  Tiotropium Bromide Monohydrate (SPIRIVA RESPIMAT) 2.5 MCG/ACT AERS Inhale 2 puffs into the lungs daily. 02/25/18   Copland, Gay Filler, MD  traZODone (DESYREL) 50 MG tablet TAKE 1/2-1 TABLET BY MOUTH AT BEDTIME AS NEEDED FOR SLEEP 06/05/18   Copland, Gay Filler, MD  triamcinolone cream (KENALOG) 0.1 % Apply 1 application topically 2 (two) times daily. Use once or twice a day for poison ivy- apply sparingly to face.  Use no longer than 1 week 11/26/18   Copland, Gay Filler, MD    Family History Family History  Problem Relation Age of Onset  . Heart attack Mother   . Diabetes Mother   . Lung cancer Mother   . Asthma Mother   . Heart disease Mother   . Suicidality Father        "killed himself"  . Asthma Daughter        x 2  . Cancer Daughter        pre-cancerous polyp  . Diabetes Sister   . Cancer Sister   . Cervical cancer Daughter        cervical   . Allergies Other        all family--seasonal allergies    Social History Social History   Tobacco Use  . Smoking status: Passive Smoke Exposure - Never Smoker  . Smokeless  tobacco: Never Used  Substance Use Topics  . Alcohol use: Yes    Comment: occ  . Drug use: No     Allergies   Prednisone   Review of  Systems Review of Systems  Cardiovascular: Positive for chest pain.  Neurological: Negative for weakness.  All other systems reviewed and are negative.    Physical Exam Updated Vital Signs BP (!) 142/69   Pulse 84   Temp 97.8 F (36.6 C) (Oral)   Resp 18   Ht 5' (1.524 m)   Wt 73.5 kg   SpO2 93%   BMI 31.64 kg/m   Physical Exam Vitals signs and nursing note reviewed.  Constitutional:      General: She is not in acute distress.    Appearance: She is well-developed.  HENT:     Head: Normocephalic and atraumatic.  Eyes:     Conjunctiva/sclera: Conjunctivae normal.  Neck:     Musculoskeletal: Neck supple.  Cardiovascular:     Rate and Rhythm: Normal rate and regular rhythm.     Heart sounds: Normal heart sounds. No murmur.  Pulmonary:     Effort: Pulmonary effort is normal. No respiratory distress.     Breath sounds: Normal breath sounds.  Abdominal:     Palpations: Abdomen is soft.     Tenderness: There is no abdominal tenderness.  Musculoskeletal: Normal range of motion.  Skin:    General: Skin is warm and dry.  Neurological:     General: No focal deficit present.     Mental Status: She is alert.  Psychiatric:        Mood and Affect: Mood normal.      ED Treatments / Results  Labs (all labs ordered are listed, but only abnormal results are displayed) Labs Reviewed  CBC WITH DIFFERENTIAL/PLATELET - Abnormal; Notable for the following components:      Result Value   WBC 13.9 (*)    RBC 5.27 (*)    MCH 25.0 (*)    Neutro Abs 9.6 (*)    Eosinophils Absolute 0.6 (*)    Abs Immature Granulocytes 0.08 (*)    All other components within normal limits  COMPREHENSIVE METABOLIC PANEL - Abnormal; Notable for the following components:   Glucose, Bld 261 (*)    All other components within normal limits  URINALYSIS, ROUTINE W REFLEX MICROSCOPIC - Abnormal; Notable for the following components:   Color, Urine STRAW (*)    Glucose, UA >=500 (*)    Leukocytes,Ua  TRACE (*)    All other components within normal limits  TROPONIN I  BRAIN NATRIURETIC PEPTIDE    EKG    Radiology Dg Chest Port 1 View  Result Date: 12/24/2018 CLINICAL DATA:  Chest pain EXAM: PORTABLE CHEST 1 VIEW COMPARISON:  08/22/2018 FINDINGS: Cardiac shadow is stable. Breast implants are seen. The lungs are clear. No bony abnormality is noted. IMPRESSION: No acute abnormality noted. Electronically Signed   By: Inez Catalina M.D.   On: 12/24/2018 13:09    Procedures Procedures (including critical care time)  Medications Ordered in ED Medications - No data to display   Initial Impression / Assessment and Plan / ED Course  I have reviewed the triage vital signs and the nursing notes.  Pertinent labs & imaging results that were available during my care of the patient were reviewed by me and considered in my medical decision making (see chart for details).        MDM  EKg no acute  abnormality.  Chest xray normal.  1st troponin is normal.  2nd troponin ordered.  Pt counseled on results.   Pt's care turned over to Dr. Kathrynn Humble,  Second troponin and ddimer pending   Final Clinical Impressions(s) / ED Diagnoses   Final diagnoses:  Hypertension, unspecified type  Tachycardia    ED Discharge Orders    None    An After Visit Summary was printed and given to the patient.    Fransico Meadow, [place] 12/24/18 1606    Lacretia Leigh, MD 12/30/18 1537

## 2018-12-24 NOTE — Patient Outreach (Signed)
[facility]) Care Management  12/24/2018  Destiny Ballard [birth year] [id number]   [name]  Referral Date:12/25/2017 Referral Source:Transfer from [facility] Reason for Referral:Continued Disease Management Education Insurance:Medicare   Outreach Attempt: Received notification patient in the emergency room.  Just arrived and awaiting to be triaged.  Plan:  RN Health Coach will monitor chart for possible hospital admission.  RN Health Coach will make telephone outreach to patient within the next 10 business days if patient not admitted to hospital.  [place] [phone] [name]

## 2018-12-24 NOTE — ED Triage Notes (Signed)
Pt here c/o non radiating centralized chest pain that began today along with some sob ; denies any new cough , hx of copd ; pt also states that her hr and bp were high at home ; pt states her bp was around 175/98 and hr was around 117 ; pt denies any fevers

## 2018-12-24 NOTE — Discharge Instructions (Addendum)
See your Physician for recheck.  Return if any problems.  °

## 2018-12-29 ENCOUNTER — Encounter: Payer: Self-pay | Admitting: *Deleted

## 2018-12-29 ENCOUNTER — Other Ambulatory Visit: Payer: Self-pay | Admitting: *Deleted

## 2018-12-29 NOTE — Patient Outreach (Signed)
[facility]) Care Management  [facility] Care Manager  12/29/2018   Destiny Ballard [birth year] [id number]   [place] Quarterly Outreach   Referral Date:  12/25/2017 Referral Source:  Transfer from [place] Reason for Referral:  Continued Disease Management Education Insurance:  Medicare   Outreach Attempt:  Successful telephone outreach to patient for quarterly follow up.  HIPAA verified with patient.  Patient reporting she is feeling a little better.  Does report emergency room visit last week with chest pain, hypertension, and elevated heart rate.  Endorses she continues to have some chest pressure.  Shortness of breath is about the same and blood pressure and heart rates have been better controlled.  Does not remember last monitored blood pressure from 2 days ago, but states it was not elevated.  Upon medication review, patient taking her Spiriva as needed instead of daily as ordered.  States she only takes as needed for shortness of breath due to cost of medication.  Reviewed and discussed [place] referral for medication assistance and patient verbally agrees.  Reports the Spiriva cost about $100 per fill.  Discussed importance of taking medication as prescribed and the difference of maintenance inhaler versus rescue inhaler use.  Encouraged patient to use her Spiriva daily as prescribed.  Reports monitoring blood sugars only about once a day.  Has not checked blood sugar today, but CBG was 205 yesterday after eating breakfast.  Reports blood sugars have been ranging in the 200's.  Stating she has had a decrease in appetite and snacks all day instead of eating balanced meals.  Discussed importance of eating balanced meals and low carbohydrate diet options.    Encounter Medications:  Outpatient Encounter Medications as of 12/29/2018  Medication Sig Note  . albuterol (PROAIR HFA) 108 (90 Base) MCG/ACT inhaler Inhale 2 puffs into the lungs every 4 (four) hours  as needed for wheezing or shortness of breath.   [name] aspirin EC 81 MG tablet Take 81 mg by mouth daily.   . benzonatate (TESSALON) 200 MG capsule TAKE 1 CAPSULE BY MOUTH 3 TIMES A DAY AS NEEDED FOR COUGH (Patient taking differently: Take 200 mg by mouth 2 (two) times daily as needed for cough. )   . cetirizine (ZYRTEC) 10 MG tablet TAKE 1 TABLET BY MOUTH DAILY AS NEEDED FOR ALLERGIES (Patient taking differently: Take 10 mg by mouth daily. )   . Cholecalciferol (VITAMIN D3) 1000 UNITS CAPS Take 1,000 Units by mouth daily.    . clonazePAM (KLONOPIN) 0.5 MG tablet TAKE 0.5 TABLETS (0.25 MG TOTAL) BY MOUTH 2 TIMES DAILY AS NEEDED FOR ANXIETY (Patient taking differently: Take 0.5 mg by mouth 2 (two) times daily as needed for anxiety. )   . dapagliflozin propanediol (FARXIGA) 10 MG TABS tablet Take 10 mg by mouth daily.   [name] FLUoxetine (PROZAC) 40 MG capsule Take 1 capsule (40 mg total) by mouth daily.   . fluticasone (FLONASE) 50 MCG/ACT nasal spray SPRAY 2 SPRAYS INTO EACH NOSTRIL EVERY DAY (Patient taking differently: Place 2 sprays into both nostrils as needed. )   . furosemide (LASIX) 20 MG tablet TAKE ONE TABLET DAILY IF NEEDED FOR LEG SWELLING (Patient taking differently: Take 20 mg by mouth at bedtime. Take one tablet daily if needed for leg swelling)   . gabapentin (NEURONTIN) 300 MG capsule Take 2 capsules (600 mg total) by mouth 2 (two) times daily.   . hydrALAZINE (APRESOLINE) 25 MG tablet Take 2 tablets (50 mg total)  by mouth 3 (three) times daily.   . hydrOXYzine (ATARAX/VISTARIL) 25 MG tablet TAKE 0.5-1 TABLETS (12.5-25 MG TOTAL) BY MOUTH EVERY 8 (EIGHT) HOURS AS NEEDED FOR ITCHING. (Patient taking differently: Take 25 mg by mouth daily. )   . ibuprofen (ADVIL,MOTRIN) 200 MG tablet Take 800 mg by mouth every 6 (six) hours as needed for headache or moderate pain.    . Insulin Detemir (LEVEMIR FLEXTOUCH) 100 UNIT/ML Pen Inject 10 Units into the skin daily. (Patient taking differently: Inject 28  Units into the skin daily. ) 12/29/2018: Reports taking 28 units daily  . lovastatin (MEVACOR) 20 MG tablet TAKE 1 TABLET BY MOUTH EVERYDAY AT BEDTIME (Patient taking differently: Take 20 mg by mouth at bedtime. )   . metFORMIN (GLUCOPHAGE-XR) 500 MG 24 hr tablet TAKE 2 TABLETS BY MOUTH TWICE A DAY (Patient taking differently: Take 1,000 mg by mouth 2 (two) times a day. )   . methocarbamol (ROBAXIN) 500 MG tablet Take 1 tablet (500 mg total) by mouth 2 (two) times daily.   . metoprolol succinate (TOPROL-XL) 100 MG 24 hr tablet TAKE 1 TABLET BY MOUTH EVERY DAY (Patient taking differently: Take 100 mg by mouth daily. )   . montelukast (SINGULAIR) 10 MG tablet Take 1 tablet (10 mg total) by mouth at bedtime. Use as needed for allergies   . nitroGLYCERIN (NITROSTAT) 0.4 MG SL tablet Place 1 tablet (0.4 mg total) under the tongue every 5 (five) minutes as needed for chest pain.   [name] omega-3 acid ethyl esters (LOVAZA) 1 g capsule TAKE 1 CAPSULE (1 G TOTAL) BY MOUTH 2 (TWO) TIMES DAILY. (Patient taking differently: Take 1 capsule by mouth at bedtime. )   . ondansetron (ZOFRAN ODT) 4 MG disintegrating tablet '4mg'$  ODT q4 hours prn nausea/vomit   . polyethylene glycol (MIRALAX / GLYCOLAX) packet Take 17 g by mouth daily. (Patient taking differently: Take 17 g by mouth daily as needed for mild constipation, moderate constipation or severe constipation. )   . Polyvinyl Alcohol-Povidone (REFRESH OP) Place 2 drops into both eyes daily as needed (for dry eyes).   . Tiotropium Bromide Monohydrate (SPIRIVA RESPIMAT) 2.5 MCG/ACT AERS Inhale 2 puffs into the lungs daily. (Patient taking differently: Inhale 2 puffs into the lungs as needed (shortness of breath). ) 12/29/2018: Reviewed instructions with patient and the importance of taking medication daily  . traZODone (DESYREL) 50 MG tablet TAKE 1/2-1 TABLET BY MOUTH AT BEDTIME AS NEEDED FOR SLEEP (Patient taking differently: Take 50 mg by mouth at bedtime as needed for sleep.  )   . albuterol (PROVENTIL) (2.5 MG/3ML) 0.083% nebulizer solution Take 3 mLs (2.5 mg total) by nebulization every 4 (four) hours as needed for wheezing or shortness of breath. (Patient not taking: Reported on 12/24/2018)   . calcium carbonate (TUMS - DOSED IN MG ELEMENTAL CALCIUM) 500 MG chewable tablet Chew 2 tablets by mouth daily as needed for indigestion or heartburn.   [name] HYDROcodone-acetaminophen (NORCO/VICODIN) 5-325 MG tablet Take 1-2 tablets by mouth every 6 (six) hours as needed for moderate pain or severe pain. DO NOT TAKE WITH ATIVAN, XANAX, OR VALUUM (Patient not taking: Reported on 12/24/2018)   . ipratropium (ATROVENT) 0.03 % nasal spray Place 2 sprays into the nose 4 (four) times daily. (Patient not taking: Reported on 12/24/2018)   . KLOR-CON 10 10 MEQ tablet TAKE 1 TABLET (10 MEQ TOTAL) BY MOUTH DAILY. TAKE ONLY ON DAYS THAT YOU USE LASIX/ FUROSEMIDE (Patient not taking: Reported on 12/24/2018)   .  losartan (COZAAR) 100 MG tablet Take 1 tablet (100 mg total) by mouth daily. 10/02/2018: Continues to to take  . methocarbamol (ROBAXIN) 750 MG tablet Take 1 tablet (750 mg total) by mouth every 12 (twelve) hours as needed for muscle spasms. 6/94/8546: duplicate  . ondansetron (ZOFRAN ODT) 4 MG disintegrating tablet Take 1 tablet (4 mg total) by mouth every 8 (eight) hours as needed for nausea or vomiting.   . triamcinolone cream (KENALOG) 0.1 % Apply 1 application topically 2 (two) times daily. Use once or twice a day for poison ivy- apply sparingly to face.  Use no longer than 1 week (Patient not taking: Reported on 12/24/2018)    No facility-administered encounter medications on file as of 12/29/2018.     Functional Status:  In your present state of health, do you have any difficulty performing the following activities: 12/29/2018 02/03/2018  Hearing? N N  Vision? N N  Difficulty concentrating or making decisions? N N  Walking or climbing stairs? Y Y  Comment trouble walking due to hip pain  legs are weak  Dressing or bathing? N N  Doing errands, shopping? N N  Preparing Food and eating ? N N  Using the Toilet? N N  In the past six months, have you accidently leaked urine? Tempie Donning  Comment wears depends -  Do you have problems with loss of bowel control? Tempie Donning  Comment wears depends -  Managing your Medications? N N  Managing your Finances? N N  Housekeeping or managing your Housekeeping? N N  Some recent data might be hidden    Fall/Depression Screening: Fall Risk  12/29/2018 10/02/2018 05/04/2018  Falls in the past year? 0 0 No  Risk for fall due to : Medication side effect;Impaired balance/gait;Impaired mobility Impaired balance/gait;Impaired mobility;Impaired vision Impaired balance/gait;Impaired mobility;Medication side effect  Follow up Falls evaluation completed;Education provided;Falls prevention discussed Falls evaluation completed;Falls prevention discussed;Education provided -   [facility] 2/9 Scores 12/29/2018 02/03/2018 10/24/2017 09/01/2017 08/27/2016 09/20/2015  PHQ - 2 Score 2 2 6 6 3  0  PHQ- 9 Score 7 6 9 21 15  -  Exception Documentation - - - - - Patient refusal   THN CM Care Plan Problem One     Most Recent Value  Care Plan Problem One  Knowledge deficiet of COPD diagnosis and self care management of diabetes  Role Documenting the Problem One  [facility] [place] for Problem One  Active  THN Long Term Goal   Patient will report a decrease in A1C by 0.3 points in the next 90 days.  THN Long Term Goal Start Date  12/29/18  THN Long Term Goal Met Date  12/29/18  Interventions for Problem One Long Term Goal  Care plan and goals reviewed and discussed with patient, discussed current A1C with patient (9.2) and ways to reduce,  reviewed and encouraged low carbohydrate diet with patient, attempted to send patient diabetic diet education to patient (declines at this time), reviewed medications and encouraged medication compliance, discussed and encouraged importance of eating  well balanced meals at least 3 times a day instead of snacking all day, discussed and reviewed healthy snack options  THN CM Short Term Goal #1   Patient will not have any emergency room visits in the next 90 days.  THN CM Short Term Goal #1 Start Date  12/29/18  Interventions for Short Term Goal #1  Encouraged patient to contact primary care provider for appointment post emergency room visit, encouraged patient  to keep and attend upcoming scheduled cardiology appointment, reviewed medications and indications and encouraged medication compliance, encouraged patient to review COPD education material, encouraged patient to schedule appointment with pulmonologist  [facility] CM Short Term Goal #2   Patient will report using her Spiriva inhaler daily as prescribed in the next 30 days.  THN CM Short Term Goal #2 Start Date  12/29/18  Interventions for Short Term Goal #2  Had patient read prescription directions for inhaler/spiriva, discussed importance of taking medication daily as prescribed, placed [place] referral to help with inhaler cough, reviewed use of rescue inhaler (albuterol), discussed purpose of maintanence inhaler versus rescue inhaler, encouraged patient to use spiriva inhaler daily as prescribed     Appointments:  Last attended appointment with primary care provider on 11/26/2018.  Encouraged patient to contact primary care provider for post emergency room follow up and to also schedule pulmonology follow up.  Has scheduled Cardiology appointment on 01/04/2019; encouraged to keep and attend this appointment.  Plan: RN Health Coach will place [place] referral for medication assistance with Spiriva. RN Health Coach will send primary care provider quarterly update. RN Health Coach will make next telephone outreach to patient with in the month of July.  [place] [phone] [email or web address]

## 2018-12-31 ENCOUNTER — Telehealth: Payer: Self-pay | Admitting: *Deleted

## 2018-12-31 ENCOUNTER — Telehealth: Payer: Self-pay | Admitting: Pharmacist

## 2018-12-31 NOTE — Telephone Encounter (Signed)

## 2018-12-31 NOTE — Patient Outreach (Signed)
[facility]) Care Management  12/31/2018  Destiny Ballard 1950/10/14 [id number]  Patient was called regarding medication assistance with Spiriva.  Unfortunately, she did not answer the phone. HIPAA compliant message was left on her voicemail.  Plan: Call patient back in 5-7 business days. Send patient an unsuccessful Economist.   Elayne Guerin, PharmD, [place] Clinical Pharmacist [phone]

## 2018-12-31 NOTE — Telephone Encounter (Signed)
All covid questions answered.  NO.

## 2019-01-01 NOTE — Progress Notes (Signed)
CARDIOLOGY OFFICE NOTE  Date:  01/04/2019    Destiny Ballard Date of Birth: [birth year] Medical Record #[id number]  PCP:  Darreld Mclean, MD  Cardiologist:  Servando Snare     Chief Complaint  Patient presents with  . Coronary Artery Disease    History of Present Illness: Destiny Ballard is a 68 y.o. female who presents today for a follow up/post hospital visit. Former patient of Dr. Susa Simmonds and Dr. Claris Gladden - has primarily followed with meover the past few years.   She has amedical history significant of hypertension, hyperlipidemia, diabetes mellitus, GERD, anxiety, morbid obesity,&CAD, s/p stent placement 10/26/2005.Her last cath was in 10/26/2017 was stable. She has had a tendency towards medication non compliance.  Her husband "[name]" died right after Christmas in 2016/10/26 - he was a long term patient of mine as well. She has had lots of grief - chest pain - had intermediate Myoview and was recathed - this was stable. She has had a chronic cough and is followed with pulmonary as well. Last seen in November - lots of health issues - she was not really concerned - admits she just wanted to "lay down and die" to be with [name].   Was in the hospital earlier this month with HTN, chest pain and tachycardia. Troponin negative - does not look like medicines were changed.   The patient does not have symptoms concerning for COVID-19 infection (fever, chills, cough, or new shortness of breath).   Comes in today. Here alone. She had an unpleasant experience at the hospital earlier this month - she left - no medicines changed because she left. She felt bad - having palpitations - to the point she could see her shirt moving. Not dizzy. She admits she "is just lost". Still just wishes she could die. She is doing some rescue with some squirrels - this helps her and this is about the only thing that brings her happiness. Feels ok now but having some leg issue. More swelling. She does not cook - she  is eating snacks/sandwiches - does not sound like she gets much take out but probably lots of processed food. She feels short of breath. Her medicines are not correct. Not using any lasix - unclear if she has. She did see her PCP earlier today. She was out of her Gabapentin for some time - now back on - seems to correlate with the onset of the swelling.    Past Medical History:  Diagnosis Date  . Anxiety    Prior suicide attempt  . CAD (coronary artery disease)    a) s/p DES to LAD 07/2005 b) Last Myoview low risk 11/2011 showing small fixed apical perfusion defect (prior MI vs attenuation) but no ischemia - normal EF.  [name] Cervical spondylosis   . Coronary atherosclerosis 06/28/2008  . Depression   . Depression with anxiety   . Diabetes mellitus without complication ([facility])   . GERD (gastroesophageal reflux disease)   . Hyperlipidemia   . Hypertension   . Insulin resistance   . Iron deficiency anemia   . Obesity     Past Surgical History:  Procedure Laterality Date  . BREAST ENHANCEMENT SURGERY    . CARDIAC CATHETERIZATION  06/17/2007   NORMAL. EF 60%  . CARDIAC CATHETERIZATION N/A 01/29/2016   Procedure: Left Heart Cath and Coronary Angiography;  Surgeon: Sherren Mocha, MD;  Location: [facility];  Service: Cardiovascular;  Laterality: N/A;  . CERVICAL SPONDYLOSIS  SINGLE LEVEL FUSION  . CHILDBIRTH     X3  . CORONARY STENT PLACEMENT  07/2005   LEFT ANTERIOR DESCENDING  . FOREARM FRACTURE SURGERY  2010   hand and shoulder   . INCISION AND DRAINAGE BREAST ABSCESS  01/05/2012      . INCISION AND DRAINAGE PERIRECTAL ABSCESS N/A 02/18/2014   Procedure: IRRIGATION AND DEBRIDEMENT PERIRECTAL ABSCESS;  Surgeon: Pedro Earls, MD;  Location: [facility];  Service: General;  Laterality: N/A;  . LEFT HEART CATH AND CORONARY ANGIOGRAPHY N/A 10/10/2017   Procedure: LEFT HEART CATH AND CORONARY ANGIOGRAPHY;  Surgeon: Burnell Blanks, MD;  Location: [facility];  Service:  Cardiovascular;  Laterality: N/A;  . LUMBAR LAMINECTOMY    . ROTATOR CUFF REPAIR     bilaterla  . TONSILLECTOMY AND ADENOIDECTOMY    . TUBAL LIGATION    . VIDEO BRONCHOSCOPY Bilateral 08/13/2016   Procedure: VIDEO BRONCHOSCOPY WITHOUT FLUORO;  Surgeon: Collene Gobble, MD;  Location: [facility];  Service: Cardiopulmonary;  Laterality: Bilateral;     Medications: Current Meds  Medication Sig  . albuterol (PROAIR HFA) 108 (90 Base) MCG/ACT inhaler Inhale 2 puffs into the lungs every 4 (four) hours as needed for wheezing or shortness of breath.  [name] aspirin EC 81 MG tablet Take 81 mg by mouth daily.  . benzonatate (TESSALON) 200 MG capsule TAKE 1 CAPSULE BY MOUTH 3 TIMES A DAY AS NEEDED FOR COUGH  . calcium carbonate (TUMS - DOSED IN MG ELEMENTAL CALCIUM) 500 MG chewable tablet Chew 2 tablets by mouth daily as needed for indigestion or heartburn.  . cetirizine (ZYRTEC) 10 MG tablet TAKE 1 TABLET BY MOUTH DAILY AS NEEDED FOR ALLERGIES  . Cholecalciferol (VITAMIN D3) 1000 UNITS CAPS Take 1,000 Units by mouth daily.   . clonazePAM (KLONOPIN) 0.5 MG tablet TAKE 0.5 TABLETS (0.25 MG TOTAL) BY MOUTH 2 TIMES DAILY AS NEEDED FOR ANXIETY  . dapagliflozin propanediol (FARXIGA) 10 MG TABS tablet Take 10 mg by mouth daily.  [name] FLUoxetine (PROZAC) 40 MG capsule Take 1 capsule (40 mg total) by mouth daily.  . fluticasone (FLONASE) 50 MCG/ACT nasal spray SPRAY 2 SPRAYS INTO EACH NOSTRIL EVERY DAY  . furosemide (LASIX) 20 MG tablet TAKE ONE TABLET DAILY IF NEEDED FOR LEG SWELLING  . gabapentin (NEURONTIN) 300 MG capsule Take 2 capsules (600 mg total) by mouth 2 (two) times daily.  . hydrALAZINE (APRESOLINE) 25 MG tablet Take 2 tablets (50 mg total) by mouth 3 (three) times daily.  [name] HYDROcodone-acetaminophen (NORCO/VICODIN) 5-325 MG tablet Take 1-2 tablets by mouth every 6 (six) hours as needed for moderate pain or severe pain. DO NOT TAKE WITH ATIVAN, XANAX, OR VALUUM  . hydrOXYzine (ATARAX/VISTARIL) 25 MG  tablet TAKE 0.5-1 TABLETS (12.5-25 MG TOTAL) BY MOUTH EVERY 8 (EIGHT) HOURS AS NEEDED FOR ITCHING.  [name] ibuprofen (ADVIL,MOTRIN) 200 MG tablet Take 800 mg by mouth every 6 (six) hours as needed for headache or moderate pain.   . Insulin Detemir (LEVEMIR FLEXTOUCH) 100 UNIT/ML Pen Inject 10 Units into the skin daily.  [name] ipratropium (ATROVENT) 0.03 % nasal spray Place 2 sprays into the nose 4 (four) times daily.  [name] KLOR-CON 10 10 MEQ tablet TAKE 1 TABLET (10 MEQ TOTAL) BY MOUTH DAILY. TAKE ONLY ON DAYS THAT YOU USE LASIX/ FUROSEMIDE  . losartan (COZAAR) 100 MG tablet Take 1 tablet (100 mg total) by mouth daily.  [name] lovastatin (MEVACOR) 20 MG tablet TAKE 1 TABLET BY MOUTH EVERYDAY AT  BEDTIME  . metFORMIN (GLUCOPHAGE-XR) 500 MG 24 hr tablet TAKE 2 TABLETS BY MOUTH TWICE A DAY  . methocarbamol (ROBAXIN) 750 MG tablet Take 1 tablet (750 mg total) by mouth every 12 (twelve) hours as needed for muscle spasms.  . metoprolol succinate (TOPROL-XL) 100 MG 24 hr tablet TAKE 1 TABLET BY MOUTH EVERY DAY  . montelukast (SINGULAIR) 10 MG tablet Take 1 tablet (10 mg total) by mouth at bedtime. Use as needed for allergies  . nitroGLYCERIN (NITROSTAT) 0.4 MG SL tablet Place 1 tablet (0.4 mg total) under the tongue every 5 (five) minutes as needed for chest pain.  [name] omega-3 acid ethyl esters (LOVAZA) 1 g capsule TAKE 1 CAPSULE (1 G TOTAL) BY MOUTH 2 (TWO) TIMES DAILY.  [name] ondansetron (ZOFRAN ODT) 4 MG disintegrating tablet 4mg  ODT q4 hours prn nausea/vomit  . polyethylene glycol (MIRALAX / GLYCOLAX) packet Take 17 g by mouth daily.  . Polyvinyl Alcohol-Povidone (REFRESH OP) Place 2 drops into both eyes daily as needed (for dry eyes).  . Tiotropium Bromide Monohydrate (SPIRIVA RESPIMAT) 2.5 MCG/ACT AERS Inhale 2 puffs into the lungs daily.  . traZODone (DESYREL) 50 MG tablet TAKE 1/2-1 TABLET BY MOUTH AT BEDTIME AS NEEDED FOR SLEEP  . triamcinolone cream (KENALOG) 0.1 % Apply 1 application topically 2 (two) times daily. Use  once or twice a day for poison ivy- apply sparingly to face.  Use no longer than 1 week     Allergies: Allergies  Allergen Reactions  . Prednisone Other (See Comments)    REACTION: mood swings, nightmares. "Shot doesn't bother me, reaction is just with the pill" she states she has had the steroid injections before. From our records methylprednisone was given to her in 2013 without any complications.    Social History: The patient  reports that she is a non-smoker but has been exposed to tobacco smoke. She has never used smokeless tobacco. She reports current alcohol use. She reports that she does not use drugs.   Family History: The patient's family history includes Allergies in an other family member; Asthma in her daughter and mother; Cancer in her daughter and sister; Cervical cancer in her daughter; Diabetes in her mother and sister; Heart attack in her mother; Heart disease in her mother; Lung cancer in her mother; Suicidality in her father.   Review of Systems: Please see the history of present illness.  All other systems are reviewed and negative.   Physical Exam: VS:  BP 120/60   Pulse 92   Ht 5' (1.524 m)   Wt 179 lb (81.2 kg)   SpO2 92%   BMI 34.96 kg/m  .  BMI Body mass index is 34.96 kg/m.  Wt Readings from Last 3 Encounters:  01/04/19 179 lb (81.2 kg)  01/04/19 179 lb (81.2 kg)  12/24/18 162 lb (73.5 kg)    General: Pleasant. She got quite tearful during our conversation.  Weight is up from 175# from my last visit.  HEENT: Normal.  Neck: Supple, no JVD, carotid bruits, or masses noted.  Cardiac: Regular rate and rhythm. Harsh outflow murmur. +1 to 2 edema bilaterally.  Respiratory:  Lungs are clear to auscultation bilaterally with normal work of breathing.  GI: Soft and nontender.  MS: No deformity or atrophy. Gait and ROM intact.  Skin: Warm and dry. Color is normal.  Neuro:  Strength and sensation are intact and no gross focal deficits noted.  Psych: Alert,  appropriate and with normal affect.   LABORATORY DATA:  EKG:  EKG is not ordered today.   Lab Results  Component Value Date   WBC 13.9 (H) 12/24/2018   HGB 13.2 12/24/2018   HCT 42.4 12/24/2018   PLT 291 12/24/2018   GLUCOSE 261 (H) 12/24/2018   CHOL 200 (H) 05/19/2018   TRIG 609 (HH) 05/19/2018   HDL 37 (L) 05/19/2018   LDLDIRECT 100.0 04/03/2017   LDLCALC Comment 05/19/2018   ALT 15 12/24/2018   AST 18 12/24/2018   NA 138 12/24/2018   K 3.5 12/24/2018   CL 100 12/24/2018   CREATININE 0.81 12/24/2018   BUN 14 12/24/2018   CO2 24 12/24/2018   TSH 2.361 01/27/2016   INR 0.9 10/06/2017   HGBA1C 9.2 (H) 08/03/2018   MICROALBUR 7.8 (H) 01/05/2018      BNP (last 3 results) Recent Labs    12/24/18 1236  BNP 25.9    ProBNP (last 3 results) No results for input(s): PROBNP in the last 8760 hours.   Other Studies Reviewed Today:  Cardiac CathConclusion3/2019    Prox RCA lesion is 20% stenosed.  Dist RCA lesion is 20% stenosed.  Ost 2nd Mrg to 2nd Mrg lesion is 40% stenosed.  Prox Cx lesion is 20% stenosed.  Ost LAD to Prox LAD lesion is 10% stenosed.  Prox LAD to Dist LAD lesion is 20% stenosed.  1. Patent stent mid LAD with minimal restenosis.  2. Mild non-obstructive disease in the RCA, Circumflex and distal LAD  Recommendations: Continue medical management of CAD.      MyoviewStudy Highlights2/2019    Nuclear stress EF: 64%.  There was no ST segment deviation noted during stress.  Findings consistent with ischemia.  This is an intermediate risk study.  The left ventricular ejection fraction is normal (55-65%).  1. EF 64%, normal wall motion.  2. Partially reversible medium-sized, moderate intensity mid to apical anteroseptal and apical anterior perfusion defect. This is concerning for ischemia.   Intermediate risk study.      EchoStudy Conclusions2/2019  - Left ventricle: The cavity size was normal. There was  moderate concentric hypertrophy. Systolic function was normal. The estimated ejection fraction was in the range of 60% to 65%. Wall motion was normal; there were no regional wall motion abnormalities. There was an increased relative contribution of atrial contraction to ventricular filling. Doppler parameters are consistent with abnormal left ventricular relaxation (grade 1 diastolic dysfunction). - Aortic valve: Valve area (VTI): 1.97 cm^2. Valve area (Vmax): 1.67 cm^2. Valve area (Vmean): 1.71 cm^2. - Left atrium: The atrium was mildly dilated. - Pulmonary arteries: Systolic pressure could not be accurately estimated.    Assessment/Plan:  1.CAD - prior stent in 2007 - s/p cath from 09/2017 which was stable - she is to continue with CV risk factor modification - she really has no desire to follow thru - her grief continues to be the crux of her issues. This is unchanged today.   2.HTN:BP ok today. Still worry about her medicines and compliance  3.GERD: -on chronic PPItherapy - not discussed.   4.DM-II:poorly controlled - she admits she has not been taking her insulin as prescribed.   5.HLD- on multiple agents.   6.Chronic cough and chronic mild SOB:   7. Situational stress/grief - this still seems to be the crux of her issues. She has started doing some animal rehab/rescue - Still a very sad situation that I do not see changing.   8. Outflow murmur - will get echo  9. Swelling - may be  related to the restart of Gabapentin. BMET today. Start Lasix 20 mg a day - no longer prn along with the potassium.   10. COVID-19 Education: The signs and symptoms of COVID-19 were discussed with the patient and how to seek care for testing (follow up with PCP or arrange E-visit).  The importance of social distancing, staying at home, hand hygiene and wearing a mask when out in public were discussed today.     Current medicines are reviewed with  the patient today.  The patient does not have concerns regarding medicines other than what has been noted above.  The following changes have been made:  See above.  Labs/ tests ordered today include:   No orders of the defined types were placed in this encounter.    Disposition:   FU with me in a few weeks.   Patient is agreeable to this plan and will call if any problems develop in the interim.   SignedTruitt Merle, NP  01/04/2019 3:02 PM  [facility] [address] [place], [place]  [address] Phone: [phone] Fax: [phone]

## 2019-01-03 NOTE — Progress Notes (Addendum)
[place] at [facility] [address] [place], [place] [address] [phone]  Date:  01/04/2019   Name:  Destiny Ballard   DOB:  June 07, 1951   MRN:  [id number]  PCP:  Destiny Mclean, MD    Chief Complaint: Follow-up (chest pain, hospital follow up)   History of Present Illness:  Destiny Ballard is a 68 y.o. very pleasant female patient who presents with the following:  Here today for an ER follow-up Destiny Ballard was seen in the ER on June 11, with chest pain and elevated BP.  She is not sure if she just got overheated. Had negative cardiac enzymes and a CT angio (due to positive D dimer)- and was released to home She still feels like her chest is not quite right, like there is a mild pressure in the substernal area This is not changing or getting worse   History of diabetes, COPD, hypertension, hyperlipidemia, sleep apnea, CAD status post stent in 2007 She is a cardiology pt - actually has an appt with Truitt Merle this afternoon   She has noted ankle swelling the last few days- not sure why.  However she did go back on gabapentin last week after being out for a few months, this may be why   She is checking her glucose some-  Brings her meter for me to review today Taking Metformin 1000 BID She is taking 28 units of levemir currently  Foot exam due today today   She bumped her right hip getting her CT angio and it is still sore She thought she had lost weight but was up a bit today as below Due for A1c   Lab Results  Component Value Date   HGBA1C 9.2 (H) 08/03/2018   Wt Readings from Last 3 Encounters:  01/04/19 179 lb (81.2 kg)  12/24/18 162 lb (73.5 kg)  08/22/18 172 lb (78 kg)     Patient Active Problem List   Diagnosis Date Noted  . Obesity (BMI 30-39.9) 08/24/2018  . Depression with anxiety 08/24/2018  . CAD (coronary artery disease) 08/24/2018  . High blood triglycerides 08/24/2018  . Abnormal  cardiovascular stress test 08/24/2018  . Lobar pneumonia, unspecified organism ([place]) 11/13/2017  . Precordial chest pain   . Sepsis ([place]) 09/18/2017  . Chronic diastolic CHF (congestive heart failure) ([place]) 09/18/2017  . Acute respiratory failure with hypoxia ([place]) 09/18/2017  . Diabetes mellitus ([place]) 09/08/2017  . Left hip pain 10/28/2016  . Chronic rhinitis 08/07/2016  . COPD with acute exacerbation ([place]) 04/25/2016  . Essential hypertension 01/30/2016  . Hypokalemia 01/27/2016  . HLD (hyperlipidemia)   . Type 2 diabetes mellitus without complication, without long-term current use of insulin ([place])   . OSA (obstructive sleep apnea) 05/06/2014  . Dyspnea on exertion 09/02/2012  . Chest pain 12/10/2011  . Chronic cough 05/11/2010  . GERD 06/28/2008    Past Medical History:  Diagnosis Date  . Anxiety    Prior suicide attempt  . CAD (coronary artery disease)    a) s/p DES to LAD 07/2005 b) Last Myoview low risk 11/2011 showing small fixed apical perfusion defect (prior MI vs attenuation) but no ischemia - normal EF.  [name] Cervical spondylosis   . Coronary atherosclerosis 06/28/2008  . Depression   . Depression with anxiety   . Diabetes mellitus without complication ([place])   . GERD (gastroesophageal reflux disease)   . Hyperlipidemia   . Hypertension   .  Insulin resistance   . Iron deficiency anemia   . Obesity     Past Surgical History:  Procedure Laterality Date  . BREAST ENHANCEMENT SURGERY    . CARDIAC CATHETERIZATION  06/17/2007   NORMAL. EF 60%  . CARDIAC CATHETERIZATION N/A 01/29/2016   Procedure: Left Heart Cath and Coronary Angiography;  Surgeon: Sherren Mocha, MD;  Location: [facility];  Service: Cardiovascular;  Laterality: N/A;  . CERVICAL SPONDYLOSIS     SINGLE LEVEL FUSION  . CHILDBIRTH     X3  . CORONARY STENT PLACEMENT  07/2005   LEFT ANTERIOR DESCENDING  . FOREARM FRACTURE SURGERY  2010   hand and shoulder   . INCISION AND DRAINAGE BREAST ABSCESS   01/05/2012      . INCISION AND DRAINAGE PERIRECTAL ABSCESS N/A 02/18/2014   Procedure: IRRIGATION AND DEBRIDEMENT PERIRECTAL ABSCESS;  Surgeon: Pedro Earls, MD;  Location: [facility];  Service: General;  Laterality: N/A;  . LEFT HEART CATH AND CORONARY ANGIOGRAPHY N/A 10/10/2017   Procedure: LEFT HEART CATH AND CORONARY ANGIOGRAPHY;  Surgeon: Burnell Blanks, MD;  Location: [facility];  Service: Cardiovascular;  Laterality: N/A;  . LUMBAR LAMINECTOMY    . ROTATOR CUFF REPAIR     bil[place]la  . TONSILLECTOMY AND ADENOIDECTOMY    . TUBAL LIGATION    . VIDEO BRONCHOSCOPY Bilateral 08/13/2016   Procedure: VIDEO BRONCHOSCOPY WITHOUT FLUORO;  Surgeon: Collene Gobble, MD;  Location: [facility];  Service: Cardiopulmonary;  Laterality: Bilateral;    Social History   Tobacco Use  . Smoking status: Passive Smoke Exposure - Never Smoker  . Smokeless tobacco: Never Used  Substance Use Topics  . Alcohol use: Yes    Comment: occ  . Drug use: No    Family History  Problem Relation Age of Onset  . Heart attack Mother   . Diabetes Mother   . Lung cancer Mother   . Asthma Mother   . Heart disease Mother   . Suicidality Father        "killed himself"  . Asthma Daughter        x 2  . Cancer Daughter        pre-cancerous polyp  . Diabetes Sister   . Cancer Sister   . Cervical cancer Daughter        cervical   . Allergies Other        all family--seasonal allergies    Allergies  Allergen Reactions  . Prednisone Other (See Comments)    REACTION: mood swings, nightmares. "Shot doesn't bother me, reaction is just with the pill" she states she has had the steroid injections before. From our records methylprednisone was given to her in 2013 without any complications.    Medication list has been reviewed and updated.  Current Outpatient Medications on File Prior to Visit  Medication Sig Dispense Refill  . albuterol (PROAIR HFA) 108 (90 Base) MCG/ACT inhaler Inhale 2 puffs into  the lungs every 4 (four) hours as needed for wheezing or shortness of breath. 3 Inhaler 1  . aspirin EC 81 MG tablet Take 81 mg by mouth daily.    . benzonatate (TESSALON) 200 MG capsule TAKE 1 CAPSULE BY MOUTH 3 TIMES A DAY AS NEEDED FOR COUGH (Patient taking differently: Take 200 mg by mouth 2 (two) times daily as needed for cough. ) 90 capsule 5  . calcium carbonate (TUMS - DOSED IN MG ELEMENTAL CALCIUM) 500 MG chewable tablet Chew 2 tablets  by mouth daily as needed for indigestion or heartburn.    . cetirizine (ZYRTEC) 10 MG tablet TAKE 1 TABLET BY MOUTH DAILY AS NEEDED FOR ALLERGIES (Patient taking differently: Take 10 mg by mouth daily. ) 90 tablet 1  . Cholecalciferol (VITAMIN D3) 1000 UNITS CAPS Take 1,000 Units by mouth daily.     . clonazePAM (KLONOPIN) 0.5 MG tablet TAKE 0.5 TABLETS (0.25 MG TOTAL) BY MOUTH 2 TIMES DAILY AS NEEDED FOR ANXIETY (Patient taking differently: Take 0.5 mg by mouth 2 (two) times daily as needed for anxiety. ) 20 tablet 1  . dapagliflozin propanediol (FARXIGA) 10 MG TABS tablet Take 10 mg by mouth daily. 90 tablet 3  . FLUoxetine (PROZAC) 40 MG capsule Take 1 capsule (40 mg total) by mouth daily. 90 capsule 3  . fluticasone (FLONASE) 50 MCG/ACT nasal spray SPRAY 2 SPRAYS INTO EACH NOSTRIL EVERY DAY (Patient taking differently: Place 2 sprays into both nostrils as needed. ) 48 g 0  . furosemide (LASIX) 20 MG tablet TAKE ONE TABLET DAILY IF NEEDED FOR LEG SWELLING (Patient taking differently: Take 20 mg by mouth at bedtime. Take one tablet daily if needed for leg swelling) 30 tablet 1  . gabapentin (NEURONTIN) 300 MG capsule Take 2 capsules (600 mg total) by mouth 2 (two) times daily. 180 capsule 1  . hydrALAZINE (APRESOLINE) 25 MG tablet Take 2 tablets (50 mg total) by mouth 3 (three) times daily. 180 tablet 11  . HYDROcodone-acetaminophen (NORCO/VICODIN) 5-325 MG tablet Take 1-2 tablets by mouth every 6 (six) hours as needed for moderate pain or severe pain. DO NOT  TAKE WITH ATIVAN, XANAX, OR VALUUM 20 tablet 0  . hydrOXYzine (ATARAX/VISTARIL) 25 MG tablet TAKE 0.5-1 TABLETS (12.5-25 MG TOTAL) BY MOUTH EVERY 8 (EIGHT) HOURS AS NEEDED FOR ITCHING. (Patient taking differently: Take 25 mg by mouth daily. ) 270 tablet 1  . ibuprofen (ADVIL,MOTRIN) 200 MG tablet Take 800 mg by mouth every 6 (six) hours as needed for headache or moderate pain.     . Insulin Detemir (LEVEMIR FLEXTOUCH) 100 UNIT/ML Pen Inject 10 Units into the skin daily. (Patient taking differently: Inject 28 Units into the skin daily. ) 15 mL 3  . ipratropium (ATROVENT) 0.03 % nasal spray Place 2 sprays into the nose 4 (four) times daily. 30 mL 6  . KLOR-CON 10 10 MEQ tablet TAKE 1 TABLET (10 MEQ TOTAL) BY MOUTH DAILY. TAKE ONLY ON DAYS THAT YOU USE LASIX/ FUROSEMIDE 90 tablet 1  . lovastatin (MEVACOR) 20 MG tablet TAKE 1 TABLET BY MOUTH EVERYDAY AT BEDTIME (Patient taking differently: Take 20 mg by mouth at bedtime. ) 90 tablet 1  . metFORMIN (GLUCOPHAGE-XR) 500 MG 24 hr tablet TAKE 2 TABLETS BY MOUTH TWICE A DAY (Patient taking differently: Take 1,000 mg by mouth 2 (two) times a day. ) 360 tablet 0  . methocarbamol (ROBAXIN) 500 MG tablet Take 1 tablet (500 mg total) by mouth 2 (two) times daily. 20 tablet 0  . methocarbamol (ROBAXIN) 750 MG tablet Take 1 tablet (750 mg total) by mouth every 12 (twelve) hours as needed for muscle spasms. 60 tablet 3  . metoprolol succinate (TOPROL-XL) 100 MG 24 hr tablet TAKE 1 TABLET BY MOUTH EVERY DAY (Patient taking differently: Take 100 mg by mouth daily. ) 90 tablet 3  . montelukast (SINGULAIR) 10 MG tablet Take 1 tablet (10 mg total) by mouth at bedtime. Use as needed for allergies 90 tablet 1  . nitroGLYCERIN (NITROSTAT) 0.4 MG  SL tablet Place 1 tablet (0.4 mg total) under the tongue every 5 (five) minutes as needed for chest pain. 20 tablet 3  . omega-3 acid ethyl esters (LOVAZA) 1 g capsule TAKE 1 CAPSULE (1 G TOTAL) BY MOUTH 2 (TWO) TIMES DAILY. (Patient  taking differently: Take 1 capsule by mouth at bedtime. ) 180 capsule 1  . ondansetron (ZOFRAN ODT) 4 MG disintegrating tablet 4mg  ODT q4 hours prn nausea/vomit 20 tablet 0  . ondansetron (ZOFRAN ODT) 4 MG disintegrating tablet Take 1 tablet (4 mg total) by mouth every 8 (eight) hours as needed for nausea or vomiting. 20 tablet 0  . polyethylene glycol (MIRALAX / GLYCOLAX) packet Take 17 g by mouth daily. (Patient taking differently: Take 17 g by mouth daily as needed for mild constipation, moderate constipation or severe constipation. ) 14 each 0  . Polyvinyl Alcohol-Povidone (REFRESH OP) Place 2 drops into both eyes daily as needed (for dry eyes).    . Tiotropium Bromide Monohydrate (SPIRIVA RESPIMAT) 2.5 MCG/ACT AERS Inhale 2 puffs into the lungs daily. (Patient taking differently: Inhale 2 puffs into the lungs as needed (shortness of breath). ) 1 Inhaler 11  . traZODone (DESYREL) 50 MG tablet TAKE 1/2-1 TABLET BY MOUTH AT BEDTIME AS NEEDED FOR SLEEP (Patient taking differently: Take 50 mg by mouth at bedtime as needed for sleep. ) 90 tablet 1  . triamcinolone cream (KENALOG) 0.1 % Apply 1 application topically 2 (two) times daily. Use once or twice a day for poison ivy- apply sparingly to face.  Use no longer than 1 week 30 g 0  . losartan (COZAAR) 100 MG tablet Take 1 tablet (100 mg total) by mouth daily. 90 tablet 3   No current facility-administered medications on file prior to visit.     Review of Systems:  As per HPI- otherwise negative. No fever or chills   Physical Examination: Vitals:   01/04/19 1123  BP: (!) 142/60  Pulse: 88  Resp: 18  Temp: 97.8 F (36.6 C)  SpO2: 95%   Vitals:   01/04/19 1123  Weight: 179 lb (81.2 kg)  Height: 5' (1.524 m)   Body mass index is 34.96 kg/m. Ideal Body Weight: Weight in (lb) to have BMI = 25: 127.7  GEN: WDWN, NAD, Non-toxic, A & O x 3, overweight, looks well and her normal self HEENT: Atraumatic, Normocephalic. Neck supple. No  masses, No LAD. Ears and Nose: No external deformity. CV: RRR, No M/G/R. No JVD. No thrill. No extra heart sounds. PULM: CTA B, no wheezes, crackles, rhonchi. No retractions. No resp. distress. No accessory muscle use. ABD: S, NT, ND, +BS. No rebound. No HSM. EXTR: No c/c.  She has trace pitting edema of both LE, she feels like the left is worse and a bit sore.  No cords palpated  NEURO Normal gait.  PSYCH: Normally interactive. Conversant. Not depressed or anxious appearing.  Calm demeanor.  Can move both hips ok- no concern for fracture today  Assessment and Plan:   ICD-10-CM   1. Uncontrolled type 2 diabetes mellitus with complication, with long-term current use of insulin (HCC)  E11.8 Hemoglobin A1c   E11.65    Z79.4   2. Hyperlipidemia, unspecified hyperlipidemia type  E78.5 Lipid panel  3. Essential hypertension, benign  I10 CBC  4. Peripheral edema  R60.9 B Nat Peptide    US Venous Img Lower Bilateral  5. Chronic diastolic CHF (congestive heart failure) (HCC)  I50.32 B Nat Peptide   Following  up from ER visit today She was seen with CP- she continues to feel not quite right in this regard, but is seeing cardiology in a couple of hours Will obtain labs as above Set up for LE [place] due to positive D dimer and swelling/ rule out DVT  Follow-up: No follow-ups on file.  No orders of the defined types were placed in this encounter.  Orders Placed This Encounter  Procedures  . US Venous Img Lower Bilateral  . CBC  . Lipid panel  . Hemoglobin A1c  . B Nat Peptide    Signed Lamar Blinks, MD  Received her labs and [place] 6/24  Results for orders placed or performed in visit on 01/04/19  CBC  Result Value Ref Range   WBC 13.4 (H) 4.0 - 10.5 K/uL   RBC 4.85 3.87 - 5.11 Mil/uL   Platelets 305.0 150.0 - 400.0 K/uL   Hemoglobin 12.0 12.0 - 15.0 g/dL   HCT 38.6 36.0 - 46.0 %   MCV 79.4 78.0 - 100.0 fl   MCHC 31.2 30.0 - 36.0 g/dL   RDW 14.2 11.5 - 15.5 %  Lipid panel  Result  Value Ref Range   Cholesterol 200 0 - 200 mg/dL   Triglycerides (H) 0.0 - 149.0 mg/dL    579.0 Triglyceride is over 400; calculations on Lipids are invalid.   HDL 37.40 (L) >39.00 mg/dL   Total CHOL/HDL Ratio 5   Hemoglobin A1c  Result Value Ref Range   Hgb A1c MFr Bld 10.1 (H) 4.6 - 6.5 %  B Nat Peptide  Result Value Ref Range   Pro B Natriuretic peptide (BNP) 25.0 0.0 - 100.0 pg/mL  LDL cholesterol, direct  Result Value Ref Range   Direct LDL 70.0 mg/dL   She is taking farxiga Taking her metformin Taking her insulin We will add 5 mg of glipizide   Renal function fine Will reach out to cardiology re: her lipids   EXAM: BILATERAL LOWER EXTREMITY VENOUS DOPPLER ULTRASOUND  TECHNIQUE: Gray-scale sonography with compression, as well as color and duplex ultrasound, were performed to evaluate the deep venous system from the level of the common femoral vein through the popliteal and proximal calf veins.  COMPARISON:  none available  FINDINGS: Normal compressibility of the common femoral, superficial femoral, and popliteal veins, as well as the proximal calf veins. No filling defects to suggest DVT on grayscale or color Doppler imaging. Doppler waveforms show normal direction of venous flow, normal respiratory phasicity and response to augmentation. Visualized segments of the saphenous venous systems normal in caliber and compressibility.  IMPRESSION: No femoropopliteal and no calf DVT in the visualized calf veins. If clinical symptoms are inconsistent or if there are persistent or worsening symptoms, further imaging (possibly involving the iliac veins) may be warranted.

## 2019-01-04 ENCOUNTER — Encounter: Payer: Self-pay | Admitting: Nurse Practitioner

## 2019-01-04 ENCOUNTER — Ambulatory Visit (INDEPENDENT_AMBULATORY_CARE_PROVIDER_SITE_OTHER): Payer: Medicare Other | Admitting: Nurse Practitioner

## 2019-01-04 ENCOUNTER — Other Ambulatory Visit: Payer: Self-pay

## 2019-01-04 ENCOUNTER — Ambulatory Visit (INDEPENDENT_AMBULATORY_CARE_PROVIDER_SITE_OTHER): Payer: Medicare Other | Admitting: Family Medicine

## 2019-01-04 ENCOUNTER — Encounter: Payer: Self-pay | Admitting: Family Medicine

## 2019-01-04 VITALS — BP 120/60 | HR 92 | Ht 60.0 in | Wt 179.0 lb

## 2019-01-04 VITALS — BP 142/60 | HR 88 | Temp 97.8°F | Resp 18 | Ht 60.0 in | Wt 179.0 lb

## 2019-01-04 DIAGNOSIS — R609 Edema, unspecified: Secondary | ICD-10-CM | POA: Diagnosis not present

## 2019-01-04 DIAGNOSIS — I5032 Chronic diastolic (congestive) heart failure: Secondary | ICD-10-CM | POA: Diagnosis not present

## 2019-01-04 DIAGNOSIS — R600 Localized edema: Secondary | ICD-10-CM

## 2019-01-04 DIAGNOSIS — E1165 Type 2 diabetes mellitus with hyperglycemia: Secondary | ICD-10-CM | POA: Diagnosis not present

## 2019-01-04 DIAGNOSIS — R011 Cardiac murmur, unspecified: Secondary | ICD-10-CM | POA: Diagnosis not present

## 2019-01-04 DIAGNOSIS — E876 Hypokalemia: Secondary | ICD-10-CM | POA: Diagnosis not present

## 2019-01-04 DIAGNOSIS — I25118 Atherosclerotic heart disease of native coronary artery with other forms of angina pectoris: Secondary | ICD-10-CM

## 2019-01-04 DIAGNOSIS — Z794 Long term (current) use of insulin: Secondary | ICD-10-CM | POA: Diagnosis not present

## 2019-01-04 DIAGNOSIS — I10 Essential (primary) hypertension: Secondary | ICD-10-CM | POA: Diagnosis not present

## 2019-01-04 DIAGNOSIS — E785 Hyperlipidemia, unspecified: Secondary | ICD-10-CM

## 2019-01-04 DIAGNOSIS — E118 Type 2 diabetes mellitus with unspecified complications: Secondary | ICD-10-CM

## 2019-01-04 DIAGNOSIS — IMO0002 Reserved for concepts with insufficient information to code with codable children: Secondary | ICD-10-CM

## 2019-01-04 LAB — CBC
HCT: 38.6 % (ref 36.0–46.0)
Hemoglobin: 12 g/dL (ref 12.0–15.0)
MCHC: 31.2 g/dL (ref 30.0–36.0)
MCV: 79.4 fl (ref 78.0–100.0)
Platelets: 305 10*3/uL (ref 150.0–400.0)
RBC: 4.85 Mil/uL (ref 3.87–5.11)
RDW: 14.2 % (ref 11.5–15.5)
WBC: 13.4 10*3/uL — ABNORMAL HIGH (ref 4.0–10.5)

## 2019-01-04 LAB — LDL CHOLESTEROL, DIRECT: Direct LDL: 70 mg/dL

## 2019-01-04 LAB — BRAIN NATRIURETIC PEPTIDE: Pro B Natriuretic peptide (BNP): 25 pg/mL (ref 0.0–100.0)

## 2019-01-04 LAB — HEMOGLOBIN A1C: Hgb A1c MFr Bld: 10.1 % — ABNORMAL HIGH (ref 4.6–6.5)

## 2019-01-04 LAB — LIPID PANEL
Cholesterol: 200 mg/dL (ref 0–200)
HDL: 37.4 mg/dL — ABNORMAL LOW (ref >39.00)
Total CHOL/HDL Ratio: 5
Triglycerides: 579 mg/dL — ABNORMAL HIGH (ref 0.0–149.0)

## 2019-01-04 MED ORDER — POTASSIUM CHLORIDE ER 10 MEQ PO TBCR: 10.0000 meq | EXTENDED_RELEASE_TABLET | Freq: Every day | ORAL | 1 refills | Status: DC

## 2019-01-04 MED ORDER — FUROSEMIDE 20 MG PO TABS: 20.0000 mg | ORAL_TABLET | Freq: Every day | ORAL | 6 refills | Status: DC

## 2019-01-04 NOTE — Patient Instructions (Addendum)
After Visit Summary:  We will be checking the following labs today - BMET   Medication Instructions:    Continue with your current medicines.   Restart the Lasix every day  Take the potassium every day   If you need a refill on your cardiac medications before your next appointment, please call your pharmacy.     Testing/Procedures To Be Arranged:  Echocardiogram  Follow-Up:   See me in a few weeks.     At [facility], you and your health needs are our priority.  As part of our continuing mission to provide you with exceptional heart care, we have created designated Provider Care Teams.  These Care Teams include your primary Cardiologist (physician) and Advanced Practice Providers (APPs -  Physician Assistants and Nurse Practitioners) who all work together to provide you with the care you need, when you need it.  Special Instructions:  . Stay safe, stay home, wash your hands for at least 20 seconds and wear a mask when out in public.  . It was good to talk with you today.    Call the [place] office at [phone] if you have any questions, problems or concerns.

## 2019-01-04 NOTE — Patient Instructions (Signed)
Good to see you today-I will be in touch with your labs ASAP.  Your swelling may be due to starting back on gabapentin, but we will make sure there is no other cause.  I will do a blood test today to look for heart failure as a cause of swelling, we will also schedule for ultrasound of your legs.  Please stop in the imaging department on your way out, and set up a time to have your ultrasound. Otherwise you are seeing cardiology today, to help [place] follow-up on your chest discomfort.

## 2019-01-05 ENCOUNTER — Ambulatory Visit (HOSPITAL_BASED_OUTPATIENT_CLINIC_OR_DEPARTMENT_OTHER)
Admission: RE | Admit: 2019-01-05 | Discharge: 2019-01-05 | Disposition: A | Discharge status: Home / Self Care | Payer: Medicare Other | Source: Ambulatory Visit | Attending: Family Medicine | Admitting: Family Medicine

## 2019-01-05 DIAGNOSIS — M7989 Other specified soft tissue disorders: Secondary | ICD-10-CM | POA: Diagnosis not present

## 2019-01-05 DIAGNOSIS — R609 Edema, unspecified: Secondary | ICD-10-CM | POA: Diagnosis not present

## 2019-01-05 DIAGNOSIS — R600 Localized edema: Secondary | ICD-10-CM

## 2019-01-05 LAB — BASIC METABOLIC PANEL
BUN/Creatinine Ratio: 23 (ref 12–28)
BUN: 15 mg/dL (ref 8–27)
CO2: 23 mmol/L (ref 20–29)
Calcium: 9.6 mg/dL (ref 8.7–10.3)
Chloride: 99 mmol/L (ref 96–106)
Creatinine, Ser: 0.65 mg/dL (ref 0.57–1.00)
GFR calc Af Amer: 105 mL/min/{1.73_m2} (ref >59)
GFR calc non Af Amer: 91 mL/min/{1.73_m2} (ref >59)
Glucose: 170 mg/dL — ABNORMAL HIGH (ref 65–99)
Potassium: 3.9 mmol/L (ref 3.5–5.2)
Sodium: 137 mmol/L (ref 134–144)

## 2019-01-06 MED ORDER — GLIPIZIDE 5 MG PO TABS: 5.0000 mg | ORAL_TABLET | Freq: Every day | ORAL | 5 refills | Status: DC

## 2019-01-06 NOTE — Addendum Note (Signed)
Addended by: Lamar Blinks C on: 01/06/2019 05:02 PM   Modules accepted: Orders

## 2019-01-11 ENCOUNTER — Telehealth (HOSPITAL_COMMUNITY): Payer: Self-pay

## 2019-01-11 ENCOUNTER — Ambulatory Visit: Payer: Self-pay | Admitting: Pharmacist

## 2019-01-11 ENCOUNTER — Other Ambulatory Visit: Payer: Self-pay | Admitting: Pharmacist

## 2019-01-11 NOTE — Patient Outreach (Signed)
[facility]) Care Management  01/11/2019  Destiny Ballard [birth year] [phone]  Patient was called regarding medication assistance with [employer].  Unfortunately, she did not answer the phone. HIPAA compliant message was left on her voicemail. Unsuccessful outreach letter was sent on 12/31/2018. Today's call was the second unsuccessful call.  Plan: Call patient back in 10-14 business days. Close patient's case if no answer.  Elayne Guerin, PharmD, BCACP [facility] Clinical Pharmacist [phone]   Send patient an unsuccessful outreach letter.

## 2019-01-11 NOTE — Telephone Encounter (Signed)

## 2019-01-12 ENCOUNTER — Other Ambulatory Visit: Payer: Self-pay

## 2019-01-12 ENCOUNTER — Encounter (HOSPITAL_BASED_OUTPATIENT_CLINIC_OR_DEPARTMENT_OTHER): Payer: Self-pay | Admitting: *Deleted

## 2019-01-12 ENCOUNTER — Emergency Department (HOSPITAL_BASED_OUTPATIENT_CLINIC_OR_DEPARTMENT_OTHER): Payer: Medicare Other

## 2019-01-12 ENCOUNTER — Emergency Department (HOSPITAL_BASED_OUTPATIENT_CLINIC_OR_DEPARTMENT_OTHER)
Admission: EM | Admit: 2019-01-12 | Discharge: 2019-01-12 | Disposition: A | Discharge status: Home / Self Care | Payer: Medicare Other | Attending: Emergency Medicine | Admitting: Emergency Medicine

## 2019-01-12 ENCOUNTER — Ambulatory Visit (HOSPITAL_BASED_OUTPATIENT_CLINIC_OR_DEPARTMENT_OTHER): Payer: Medicare Other

## 2019-01-12 DIAGNOSIS — I5032 Chronic diastolic (congestive) heart failure: Secondary | ICD-10-CM | POA: Diagnosis not present

## 2019-01-12 DIAGNOSIS — Z7722 Contact with and (suspected) exposure to environmental tobacco smoke (acute) (chronic): Secondary | ICD-10-CM | POA: Insufficient documentation

## 2019-01-12 DIAGNOSIS — Z794 Long term (current) use of insulin: Secondary | ICD-10-CM | POA: Diagnosis not present

## 2019-01-12 DIAGNOSIS — M25551 Pain in right hip: Secondary | ICD-10-CM | POA: Insufficient documentation

## 2019-01-12 DIAGNOSIS — Z79899 Other long term (current) drug therapy: Secondary | ICD-10-CM | POA: Diagnosis not present

## 2019-01-12 DIAGNOSIS — E119 Type 2 diabetes mellitus without complications: Secondary | ICD-10-CM | POA: Diagnosis not present

## 2019-01-12 DIAGNOSIS — R600 Localized edema: Secondary | ICD-10-CM

## 2019-01-12 DIAGNOSIS — Z7982 Long term (current) use of aspirin: Secondary | ICD-10-CM | POA: Diagnosis not present

## 2019-01-12 DIAGNOSIS — I110 Hypertensive heart disease with heart failure: Secondary | ICD-10-CM | POA: Insufficient documentation

## 2019-01-12 DIAGNOSIS — E876 Hypokalemia: Secondary | ICD-10-CM | POA: Insufficient documentation

## 2019-01-12 MED ORDER — HYDROCODONE-ACETAMINOPHEN 5-325 MG PO TABS: 1.0000 | ORAL_TABLET | Freq: Four times a day (QID) | ORAL | 0 refills | Status: DC | PRN

## 2019-01-12 NOTE — ED Notes (Signed)
ED Provider at bedside. 

## 2019-01-12 NOTE — ED Notes (Signed)
Patient transported to X-ray 

## 2019-01-12 NOTE — ED Triage Notes (Signed)
Right hip pain. States she hit her right hip on a table a week ago.

## 2019-01-12 NOTE — ED Provider Notes (Signed)
[facility] EMERGENCY DEPARTMENT Provider Note   CSN: [id number] Arrival date & time: 01/12/19  1059     History   Chief Complaint Chief Complaint  Patient presents with  . Hip Pain    HPI Destiny Ballard is a 68 y.o. female.     HPI She reports that she is having pain around her right hip.  The only thing she can recall that occurred was that she was actually seen in the hospital about a week ago and was getting a CT scan on her chest.  She reports when she was transition from the stretcher into the scanner there was a height differential and she ended up hitting her right hip and it was kind of painful.  She reports is been sore ever since that time.  She reports there are certain positions that make it very uncomfortable.  She reports she is able to walk on it.  She does not have numbness weakness or tingling into the leg.  No pain burning urgency with urination.  No bowel or bladder dysfunction.  She reports she already takes a muscle relaxer because of sciatica problems that she has on her left back and lower extremity.  She reports that seemed to help somewhat but she still having quite a bit of pain with certain movements and positions on the right. Past Medical History:  Diagnosis Date  . Anxiety    Prior suicide attempt  . CAD (coronary artery disease)    a) s/p DES to LAD 07/2005 b) Last Myoview low risk 11/2011 showing small fixed apical perfusion defect (prior MI vs attenuation) but no ischemia - normal EF.  [name] Cervical spondylosis   . Coronary atherosclerosis 06/28/2008  . Depression   . Depression with anxiety   . Diabetes mellitus without complication (HCC)   . GERD (gastroesophageal reflux disease)   . Hyperlipidemia   . Hypertension   . Insulin resistance   . Iron deficiency anemia   . Obesity     Patient Active Problem List   Diagnosis Date Noted  . Obesity (BMI 30-39.9) 08/24/2018  . Depression with anxiety 08/24/2018  . CAD (coronary artery  disease) 08/24/2018  . High blood triglycerides 08/24/2018  . Abnormal cardiovascular stress test 08/24/2018  . Lobar pneumonia, unspecified organism (HCC) 11/13/2017  . Precordial chest pain   . Sepsis (HCC) 09/18/2017  . Chronic diastolic CHF (congestive heart failure) (HCC) 09/18/2017  . Acute respiratory failure with hypoxia (HCC) 09/18/2017  . Diabetes mellitus (HCC) 09/08/2017  . Left hip pain 10/28/2016  . Chronic rhinitis 08/07/2016  . COPD with acute exacerbation (HCC) 04/25/2016  . Essential hypertension 01/30/2016  . Hypokalemia 01/27/2016  . HLD (hyperlipidemia)   . Type 2 diabetes mellitus without complication, without long-term current use of insulin (HCC)   . OSA (obstructive sleep apnea) 05/06/2014  . Dyspnea on exertion 09/02/2012  . Chest pain 12/10/2011  . Chronic cough 05/11/2010  . GERD 06/28/2008    Past Surgical History:  Procedure Laterality Date  . BREAST ENHANCEMENT SURGERY    . CARDIAC CATHETERIZATION  06/17/2007   NORMAL. EF 60%  . CARDIAC CATHETERIZATION N/A 01/29/2016   Procedure: Left Heart Cath and Coronary Angiography;  Surgeon: Tonny Bollman, MD;  Location: [facility] INVASIVE CV LAB;  Service: Cardiovascular;  Laterality: N/A;  . CERVICAL SPONDYLOSIS     SINGLE LEVEL FUSION  . CHILDBIRTH     X3  . CORONARY STENT PLACEMENT  07/2005   LEFT ANTERIOR DESCENDING  .  FOREARM FRACTURE SURGERY  2010   hand and shoulder   . INCISION AND DRAINAGE BREAST ABSCESS  01/05/2012      . INCISION AND DRAINAGE PERIRECTAL ABSCESS N/A 02/18/2014   Procedure: IRRIGATION AND DEBRIDEMENT PERIRECTAL ABSCESS;  Surgeon: Valarie Merino, MD;  Location: [facility] ORS;  Service: General;  Laterality: N/A;  . LEFT HEART CATH AND CORONARY ANGIOGRAPHY N/A 10/10/2017   Procedure: LEFT HEART CATH AND CORONARY ANGIOGRAPHY;  Surgeon: Kathleene Hazel, MD;  Location: [facility] INVASIVE CV LAB;  Service: Cardiovascular;  Laterality: N/A;  . LUMBAR LAMINECTOMY    . ROTATOR CUFF REPAIR      bilaterla  . TONSILLECTOMY AND ADENOIDECTOMY    . TUBAL LIGATION    . VIDEO BRONCHOSCOPY Bilateral 08/13/2016   Procedure: VIDEO BRONCHOSCOPY WITHOUT FLUORO;  Surgeon: Leslye Peer, MD;  Location: [facility] ENDOSCOPY;  Service: Cardiopulmonary;  Laterality: Bilateral;     OB History   No obstetric history on file.      Home Medications    Prior to Admission medications   Medication Sig Start Date End Date Taking? Authorizing Provider  albuterol (PROAIR HFA) 108 (90 Base) MCG/ACT inhaler Inhale 2 puffs into the lungs every 4 (four) hours as needed for wheezing or shortness of breath. 09/15/17   Leslye Peer, MD  aspirin EC 81 MG tablet Take 81 mg by mouth daily.    [provider]  benzonatate (TESSALON) 200 MG capsule TAKE 1 CAPSULE BY MOUTH 3 TIMES A DAY AS NEEDED FOR COUGH 12/15/18   Copland, Gwenlyn Found, MD  calcium carbonate (TUMS - DOSED IN MG ELEMENTAL CALCIUM) 500 MG chewable tablet Chew 2 tablets by mouth daily as needed for indigestion or heartburn.    [provider]  cetirizine (ZYRTEC) 10 MG tablet TAKE 1 TABLET BY MOUTH DAILY AS NEEDED FOR ALLERGIES 12/16/17   Copland, Gwenlyn Found, MD  Cholecalciferol (VITAMIN D3) 1000 UNITS CAPS Take 1,000 Units by mouth daily.     [provider]  clonazePAM (KLONOPIN) 0.5 MG tablet TAKE 0.5 TABLETS (0.25 MG TOTAL) BY MOUTH 2 TIMES DAILY AS NEEDED FOR ANXIETY 01/05/18   Copland, Gwenlyn Found, MD  dapagliflozin propanediol (FARXIGA) 10 MG TABS tablet Take 10 mg by mouth daily. 08/03/18   Copland, Gwenlyn Found, MD  FLUoxetine (PROZAC) 40 MG capsule Take 1 capsule (40 mg total) by mouth daily. 12/23/18   Copland, Gwenlyn Found, MD  fluticasone (FLONASE) 50 MCG/ACT nasal spray SPRAY 2 SPRAYS INTO EACH NOSTRIL EVERY DAY 06/08/18   Leslye Peer, MD  furosemide (LASIX) 20 MG tablet Take 1 tablet (20 mg total) by mouth daily. 01/04/19   Rosalio Macadamia, NP  gabapentin (NEURONTIN) 300 MG capsule Take 2 capsules (600 mg total) by mouth 2 (two)  times daily. 12/15/18   Copland, Gwenlyn Found, MD  glipiZIDE (GLUCOTROL) 5 MG tablet Take 1 tablet (5 mg total) by mouth daily before breakfast. 01/06/19   Copland, Gwenlyn Found, MD  hydrALAZINE (APRESOLINE) 25 MG tablet Take 2 tablets (50 mg total) by mouth 3 (three) times daily. 10/08/17   Rosalio Macadamia, NP  HYDROcodone-acetaminophen (NORCO/VICODIN) 5-325 MG tablet Take 1-2 tablets by mouth every 6 (six) hours as needed for moderate pain or severe pain. DO NOT TAKE WITH ATIVAN, [place], OR VALUUM 08/23/18   Shaune Pollack, MD  HYDROcodone-acetaminophen (NORCO/VICODIN) 5-325 MG tablet Take 1 tablet by mouth every 6 (six) hours as needed for moderate pain or severe pain. 01/12/19   Arby Barrette, MD  hydrOXYzine (ATARAX/VISTARIL) 25 MG tablet TAKE 0.5-1 TABLETS (12.5-25 MG TOTAL) BY MOUTH EVERY 8 (EIGHT) HOURS AS NEEDED FOR ITCHING. 12/03/18   Copland, Gwenlyn Found, MD  ibuprofen (ADVIL,MOTRIN) 200 MG tablet Take 800 mg by mouth every 6 (six) hours as needed for headache or moderate pain.     [provider]  Insulin Detemir (LEVEMIR FLEXTOUCH) 100 UNIT/ML Pen Inject 10 Units into the skin daily. 01/30/16   Vassie Loll, MD  ipratropium (ATROVENT) 0.03 % nasal spray Place 2 sprays into the nose 4 (four) times daily. 08/03/18   Copland, Gwenlyn Found, MD  losartan (COZAAR) 100 MG tablet Take 1 tablet (100 mg total) by mouth daily. 09/10/17   Rosalio Macadamia, NP  lovastatin (MEVACOR) 20 MG tablet TAKE 1 TABLET BY MOUTH EVERYDAY AT BEDTIME 12/04/18   Copland, Gwenlyn Found, MD  metFORMIN (GLUCOPHAGE-XR) 500 MG 24 hr tablet TAKE 2 TABLETS BY MOUTH TWICE A DAY 10/21/17   Romero Belling, MD  methocarbamol (ROBAXIN) 750 MG tablet Take 1 tablet (750 mg total) by mouth every 12 (twelve) hours as needed for muscle spasms. 11/02/18   Copland, Gwenlyn Found, MD  metoprolol succinate (TOPROL-XL) 100 MG 24 hr tablet TAKE 1 TABLET BY MOUTH EVERY DAY 09/21/18   Copland, Gwenlyn Found, MD  montelukast (SINGULAIR) 10 MG tablet Take 1 tablet (10  mg total) by mouth at bedtime. Use as needed for allergies 10/12/18   Copland, Gwenlyn Found, MD  nitroGLYCERIN (NITROSTAT) 0.4 MG SL tablet Place 1 tablet (0.4 mg total) under the tongue every 5 (five) minutes as needed for chest pain. 09/01/17   Copland, Gwenlyn Found, MD  omega-3 acid ethyl esters (LOVAZA) 1 g capsule TAKE 1 CAPSULE (1 G TOTAL) BY MOUTH 2 (TWO) TIMES DAILY. 05/14/18   Rosalio Macadamia, NP  ondansetron (ZOFRAN ODT) 4 MG disintegrating tablet 4mg  ODT q4 hours prn nausea/vomit 08/23/18   Shaune Pollack, MD  polyethylene glycol (MIRALAX / GLYCOLAX) packet Take 17 g by mouth daily. 02/19/18   Khatri, Hina, PA-C  Polyvinyl Alcohol-Povidone (REFRESH OP) Place 2 drops into both eyes daily as needed (for dry eyes).    [provider]  potassium chloride (KLOR-CON 10) 10 MEQ tablet Take 1 tablet (10 mEq total) by mouth daily. 01/04/19   Rosalio Macadamia, NP  Tiotropium Bromide Monohydrate (SPIRIVA RESPIMAT) 2.5 MCG/ACT AERS Inhale 2 puffs into the lungs daily. 02/25/18   Copland, Gwenlyn Found, MD  traZODone (DESYREL) 50 MG tablet TAKE 1/2-1 TABLET BY MOUTH AT BEDTIME AS NEEDED FOR SLEEP 06/05/18   Copland, Gwenlyn Found, MD  triamcinolone cream (KENALOG) 0.1 % Apply 1 application topically 2 (two) times daily. Use once or twice a day for poison ivy- apply sparingly to face.  Use no longer than 1 week 11/26/18   Copland, Gwenlyn Found, MD    Family History Family History  Problem Relation Age of Onset  . Heart attack Mother   . Diabetes Mother   . Lung cancer Mother   . Asthma Mother   . Heart disease Mother   . Suicidality Father        "killed himself"  . Asthma Daughter        x 2  . Cancer Daughter        pre-cancerous polyp  . Diabetes Sister   . Cancer Sister   . Cervical cancer Daughter        cervical   . Allergies Other        all family--seasonal  allergies    Social History Social History   Tobacco Use  . Smoking status: Passive Smoke Exposure - Never Smoker  . Smokeless  tobacco: Never Used  Substance Use Topics  . Alcohol use: Yes    Comment: occ  . Drug use: No     Allergies   Prednisone   Review of Systems Review of Systems 10 Systems reviewed and are negative for acute change except as noted in the HPI.  Physical Exam Updated Vital Signs BP 130/68 (BP Location: Right Arm)   Pulse 78   Temp 98.2 F (36.8 C) (Oral)   Resp 18   Ht 5' (1.524 m)   Wt 81.2 kg   SpO2 93%   BMI 34.96 kg/m   Physical Exam Constitutional:      Comments: Alert nontoxic and appropriate.  No respiratory distress.  HENT:     Head: Normocephalic and atraumatic.  Eyes:     Extraocular Movements: Extraocular movements intact.  Cardiovascular:     Rate and Rhythm: Normal rate and regular rhythm.  Pulmonary:     Effort: Pulmonary effort is normal.     Breath sounds: Normal breath sounds.  Abdominal:     General: There is no distension.     Palpations: Abdomen is soft.     Tenderness: There is no abdominal tenderness. There is no guarding.  Musculoskeletal: Normal range of motion.        General: Tenderness present. No swelling.     Right lower leg: No edema.     Left lower leg: No edema.     Comments: Patient endorses some tenderness in the right lower back on the lateral aspect of the sacrum and toward the hip posteriorly.  No visible contusions abrasions or hematomas.  No erythema or rash.  Lower extremities have no peripheral edema.  Dorsalis pedis pulses are 2+ and symmetric.  I can flex and extend the hip without significant pain for the patient.  she can also elevate and hold each lower extremity against resistance.  Patient can reposition herself in the stretcher supporting weight on  the right lower extremity to elevate her pelvis and rotate exhibiting intact strength and function.  Neurological:     General: No focal deficit present.     Mental Status: She is oriented to person, place, and time.     Coordination: Coordination normal.  Psychiatric:          Mood and Affect: Mood normal.      ED Treatments / Results  Labs (all labs ordered are listed, but only abnormal results are displayed) Labs Reviewed - No data to display  EKG    Radiology Dg Hip Unilat With Pelvis 2-3 Views Right  Result Date: 01/12/2019 CLINICAL DATA:  Right hip pain EXAM: DG HIP (WITH OR WITHOUT PELVIS) 2-3V RIGHT COMPARISON:  None. FINDINGS: There is no evidence of hip fracture or dislocation. There is no evidence of arthropathy or other focal bone abnormality. IMPRESSION: No acute osseous injury of the right hip. Electronically Signed   By: Elige Ko   On: 01/12/2019 12:09    Procedures Procedures (including critical care time)  Medications Ordered in ED Medications - No data to display   Initial Impression / Assessment and Plan / ED Course  I have reviewed the triage vital signs and the nursing notes.  Pertinent labs & imaging results that were available during my care of the patient were reviewed by me and considered in my medical decision  making (see chart for details).       Patient has right hip pain but has intact function.  She is ambulating on it.  He does not show any acute findings.  No evident soft tissue abnormalities.  At this time most likely strain or deeper contusion still causing pain.  Will treat with short course of hydrocodone for pain and patient can continue her Robaxin as per usual.  Patient is to follow-up with her PCP this week for recheck.  Precautions reviewed.  Final Clinical Impressions(s) / ED Diagnoses   Final diagnoses:  Right hip pain    ED Discharge Orders         Ordered    HYDROcodone-acetaminophen (NORCO/VICODIN) 5-325 MG tablet  Every 6 hours PRN     01/12/19 1407           Arby Barrette, MD 01/12/19 1417

## 2019-01-14 ENCOUNTER — Other Ambulatory Visit: Payer: Self-pay | Admitting: Endocrinology

## 2019-01-14 DIAGNOSIS — E118 Type 2 diabetes mellitus with unspecified complications: Secondary | ICD-10-CM

## 2019-01-14 DIAGNOSIS — E1165 Type 2 diabetes mellitus with hyperglycemia: Secondary | ICD-10-CM

## 2019-01-14 DIAGNOSIS — IMO0002 Reserved for concepts with insufficient information to code with codable children: Secondary | ICD-10-CM

## 2019-01-18 DIAGNOSIS — Z01419 Encounter for gynecological examination (general) (routine) without abnormal findings: Secondary | ICD-10-CM | POA: Diagnosis not present

## 2019-01-18 DIAGNOSIS — Z1231 Encounter for screening mammogram for malignant neoplasm of breast: Secondary | ICD-10-CM | POA: Diagnosis not present

## 2019-01-18 DIAGNOSIS — Z124 Encounter for screening for malignant neoplasm of cervix: Secondary | ICD-10-CM | POA: Diagnosis not present

## 2019-01-20 ENCOUNTER — Other Ambulatory Visit: Payer: Self-pay | Admitting: Pharmacist

## 2019-01-20 NOTE — Patient Outreach (Addendum)
[facility]) Care Management  [place]   01/20/2019  DILLON MCREYNOLDS 06-22-1951 [id number]  Reason for referral: medication assistance  Referral source: [facility] Nurse Referral medication(s): Spiriva Current insurance:Medicare/[employer]  HPI:  CAD, CHF, COPD, Depression, Anxiety, Chronic rhinitis,  Hypertension, GERD, hyperlipidemia, OSA, and  type 2 diabetes. Patient was called per referral for medication assistance. HIPAA identifiers were obtained.  Patient visited the ED 01/12/2019 for hip pain.    Objective: Allergies  Allergen Reactions  . Prednisone Other (See Comments)    REACTION: mood swings, nightmares. "Shot doesn't bother me, reaction is just with the pill" she states she has had the steroid injections before. From our records methylprednisone was given to her in 2013 without any complications.    Medications Reviewed Today    Reviewed by Elayne Guerin, [facility] (Pharmacist) on 01/20/19 at 1546  Med List Status: <None>  Medication Order Taking? Sig Documenting Provider Last Dose Status Informant  albuterol (PROAIR HFA) 108 (90 Base) MCG/ACT inhaler [id number] Yes Inhale 2 puffs into the lungs every 4 (four) hours as needed for wheezing or shortness of breath. Collene Gobble, MD Taking Active Self  aspirin EC 81 MG tablet [id number] Yes Take 81 mg by mouth daily. [provider] Taking Active Self  benzonatate (TESSALON) 200 MG capsule [id number] Yes TAKE 1 CAPSULE BY MOUTH 3 TIMES A DAY AS NEEDED FOR COUGH Copland, Gay Filler, MD Taking Active Self  calcium carbonate (TUMS - DOSED IN MG ELEMENTAL CALCIUM) 500 MG chewable tablet [id number] Yes Chew 2 tablets by mouth daily as needed for indigestion or heartburn. [provider] Taking Active Self  cetirizine (ZYRTEC) 10 MG tablet [id number] Yes TAKE 1 TABLET BY MOUTH DAILY AS NEEDED FOR ALLERGIES Copland, Gay Filler, MD Taking Active Self  Cholecalciferol (VITAMIN D3) 1000 UNITS CAPS [id number] Yes  Take 1,000 Units by mouth daily.  [provider] Taking Active Self  clonazePAM (KLONOPIN) 0.5 MG tablet [id number] Yes TAKE 0.5 TABLETS (0.25 MG TOTAL) BY MOUTH 2 TIMES DAILY AS NEEDED FOR ANXIETY Copland, Gay Filler, MD Taking Active Self  dapagliflozin propanediol (FARXIGA) 10 MG TABS tablet [id number] Yes Take 10 mg by mouth daily. Copland, Gay Filler, MD Taking Active Self  FLUoxetine (PROZAC) 40 MG capsule [id number] Yes Take 1 capsule (40 mg total) by mouth daily. Copland, Gay Filler, MD Taking Active Self  fluticasone (FLONASE) 50 MCG/ACT nasal spray [id number] Yes SPRAY 2 SPRAYS INTO EACH NOSTRIL EVERY DAY Byrum, Rose Fillers, MD Taking Active Self  furosemide (LASIX) 20 MG tablet [id number] Yes Take 1 tablet (20 mg total) by mouth daily. Burtis Junes, NP Taking Active   gabapentin (NEURONTIN) 300 MG capsule [id number] Yes Take 2 capsules (600 mg total) by mouth 2 (two) times daily. Copland, Gay Filler, MD Taking Active Self  glipiZIDE (GLUCOTROL) 5 MG tablet [id number] Yes Take 1 tablet (5 mg total) by mouth daily before breakfast. Copland, Gay Filler, MD Taking Active   hydrALAZINE (APRESOLINE) 25 MG tablet [id number] Yes Take 2 tablets (50 mg total) by mouth 3 (three) times daily. Burtis Junes, NP Taking Active Self  HYDROcodone-acetaminophen (NORCO/VICODIN) 5-325 MG tablet [id number] Yes Take 1-2 tablets by mouth every 6 (six) hours as needed for moderate pain or severe pain. DO NOT TAKE WITH ATIVAN, [place], OR Greg Cutter, MD Taking Active Self  HYDROcodone-acetaminophen (NORCO/VICODIN) 5-325 MG tablet [id number] Yes Take 1 tablet by mouth every 6 (six) hours as needed for moderate pain or severe pain.  Charlesetta Shanks, MD Taking Active   hydrOXYzine (ATARAX/VISTARIL) 25 MG tablet [id number] Yes TAKE 0.5-1 TABLETS (12.5-25 MG TOTAL) BY MOUTH EVERY 8 (EIGHT) HOURS AS NEEDED FOR ITCHING. Copland, Gay Filler, MD Taking Active Self  ibuprofen (ADVIL) 600 MG tablet [id number] Yes Take 600  mg by mouth every 8 (eight) hours as needed. for pain [provider] Taking Active   Insulin Detemir (LEVEMIR FLEXTOUCH) 100 UNIT/ML Pen [id number] Yes Inject 10 Units into the skin daily.  Patient taking differently: Inject 28 Units into the skin daily.    Barton Dubois, MD Taking Active Self           Med Note Coralyn Mark, [place] Jan 04, 2019  2:10 PM)    losartan (COZAAR) 100 MG tablet [id number] Yes Take 1 tablet (100 mg total) by mouth daily. Burtis Junes, NP Taking Active Self           Med Note Agnes Lawrence Jan 04, 2019  2:10 PM)    lovastatin (MEVACOR) 20 MG tablet [id number] Yes TAKE 1 TABLET BY MOUTH EVERYDAY AT BEDTIME Copland, Gay Filler, MD Taking Active Self  metFORMIN (GLUCOPHAGE-XR) 500 MG 24 hr tablet [id number] Yes TAKE 2 TABLETS BY MOUTH TWICE A Marella Chimes, MD Taking Active Self  methocarbamol (ROBAXIN) 750 MG tablet [id number] Yes Take 1 tablet (750 mg total) by mouth every 12 (twelve) hours as needed for muscle spasms. Copland, Gay Filler, MD Taking Active Self           Med Note Coralyn Mark, [place] Jan 04, 2019  2:10 PM)    metoprolol succinate (TOPROL-XL) 100 MG 24 hr tablet [id number] Yes TAKE 1 TABLET BY MOUTH EVERY DAY Copland, Gay Filler, MD Taking Active Self  montelukast (SINGULAIR) 10 MG tablet [id number] Yes Take 1 tablet (10 mg total) by mouth at bedtime. Use as needed for allergies Copland, Gay Filler, MD Taking Active Self  nitroGLYCERIN (NITROSTAT) 0.4 MG SL tablet [id number] Yes Place 1 tablet (0.4 mg total) under the tongue every 5 (five) minutes as needed for chest pain. Copland, Gay Filler, MD Taking Active Self  omega-3 acid ethyl esters (LOVAZA) 1 g capsule [id number] Yes TAKE 1 CAPSULE (1 G TOTAL) BY MOUTH 2 (TWO) TIMES DAILY. Burtis Junes, NP Taking Active Self  ondansetron (ZOFRAN ODT) 4 MG disintegrating tablet [id number] Yes 4mg  ODT q4 hours prn nausea/vomit Duffy Bruce, MD Taking Active Self  penicillin v potassium  (VEETID) 500 MG tablet [id number] Yes TAKE 1 TABLET BY MOUTH THREE TIMES A DAY FOR 10 DAYS [provider] Taking Active   polyethylene glycol (MIRALAX / GLYCOLAX) packet [id number] Yes Take 17 g by mouth daily. Delia Heady, PA-C Taking Active Self  Polyvinyl Alcohol-Povidone (REFRESH OP) [id number] Yes Place 2 drops into both eyes daily as needed (for dry eyes). [provider] Taking Active Self  potassium chloride (KLOR-CON 10) 10 MEQ tablet [id number] Yes Take 1 tablet (10 mEq total) by mouth daily. Burtis Junes, NP Taking Active   Tiotropium Bromide Monohydrate (SPIRIVA RESPIMAT) 2.5 MCG/ACT AERS [id number] Yes Inhale 2 puffs into the lungs daily. Copland, Gay Filler, MD Taking Active Self           Med Note Agnes Lawrence Jan 04, 2019  2:10 PM)    traZODone (DESYREL) 50 MG tablet [id number] Yes TAKE 1/2-1 TABLET BY MOUTH AT BEDTIME AS NEEDED FOR SLEEP Copland, Gay Filler, MD Taking  Active Self  triamcinolone cream (KENALOG) 0.1 % [id number]  Apply 1 application topically 2 (two) times daily. Use once or twice a day for poison ivy- apply sparingly to face.  Use no longer than 1 week Copland, Gay Filler, MD  Active Self          Assessment: Patient's medications were reviewed via telephone  Drugs sorted by system:  Neurologic/Psychologic: Clonazepam, Fluoxetine, Gabapentin, Trazodone  Cardiovascular: Furosemide, hydralazine, Lovaza, Metoprolol, Lovastatin, Losartan, Hydralazine, Nitroglycerin  Pulmonary/Allergy: Spiriva, Benzonatate, Cetirizine, Fluticasone nasal spray, Montelukast  Gastrointestinal: Ondansetron, Polyethylene Glycol,   Endocrine: Farxiga, Glipizide, Metformin  Pain: Hydrocodone/Acetaminophen, Methocarbamol, Ibuprofen  Infectious Diseases: Penicillin  Vitamins/Minerals/Supplements: Calcium Carbonate, Potassium Chloride, Vitamin D  Miscellaneous: Refresh Eye Drops   Medication Review Findings:  . HgA1c- 10.1% -01/04/2019 o On  statin-Lovastatin-last filled 12/04/2018 o Diabetes not controlled.  Current therapy:  Farxiga, Glipizide, Metformin, Levemir 28 units daily . Patient admitted to her provider on 01/04/2019 that she had not been using her insulin as prescribed.  She reported using it as prescribed now.  . Patient said she does not check her blood sugar daily.  She was asked to check it a least twice a day for 1 week and I will call her back for her numbers. (Patient committed to take her blood sugar in the morning before eating and one other time each day. She said she would write it on her calendar.)  Medication Assistance Findings:  Medication assistance needs identified: Vernona Rieger, Spirva, Proventil HFA   Additional medication assistance options reviewed with patient as warranted:  No other options identified  Plan: I will route patient assistance letter to [place] technician who will coordinate patient assistance program application process for medications listed above.  [facility] pharmacy technician will assist with obtaining all required documents from both patient and provider(s) and submit application(s) once completed.   Follow up with the patient in 1 week about blood sugars.  Follow up in 1 month about patient assistance.  Route note to provider  Elayne Guerin, PharmD, [place] Clinical Pharmacist [phone]

## 2019-01-21 ENCOUNTER — Other Ambulatory Visit: Payer: Self-pay | Admitting: Pharmacist

## 2019-01-21 ENCOUNTER — Other Ambulatory Visit: Payer: Self-pay | Admitting: Pharmacy Technician

## 2019-01-21 NOTE — Patient Outreach (Signed)
[facility]) Care Management  01/21/2019  Destiny Ballard 20-Jan-1951 [id number]   Patient was called to follow up on medication assistance. HIPAA identifiers were obtained.  Discussed the need for the patient to obtain a print out from [facility] to obtain her TROOP as [place] requires patients to spend at least 3% of their income in medication expenses.  Plan: Patient said she would get a print out. Patient will be sent applications for Proventil HFA, Spiriva, and Farxiga.  Elayne Guerin, PharmD, [place] Clinical Pharmacist [phone]

## 2019-01-21 NOTE — Patient Outreach (Signed)
[facility]) Care Management  01/21/2019  ZELPHA MESSING 1950-08-06 [id number]                           Medication Assistance Referral  Referral From: [place]  Medication/Company: [employer] / [place] Patient application portion:  Mailed Provider application portion: Faxed  to Dr Lamar Blinks  Medication/Company: [place] / [employer] Patient application portion:  Education officer, museum portion: Faxed  to Dr Janett Billow Copland  Medication/Company: [employer] / Spiriva Respimat Patient application portion:  Mailed Provider application portion: Faxed  to Dr Janett Billow Copland  Medication/Company: [employer] / Proventil HFA Patient application portion:  Mailed Provider application portion: Interoffice Mailed to Dr Janett Billow Copland  Follow up:  Will follow up with patient in 5-10 business days to confirm application(s) have been received.  Myrth Dahan P. [place], [place] Management [phone]

## 2019-01-25 ENCOUNTER — Telehealth: Payer: Self-pay | Admitting: *Deleted

## 2019-01-25 ENCOUNTER — Telehealth: Payer: Self-pay | Admitting: Nurse Practitioner

## 2019-01-25 NOTE — Telephone Encounter (Signed)
Received fax from [place] for pt assistance on pt's Proventil HFA. Form completed and placed in PCP's red folder.

## 2019-01-25 NOTE — Telephone Encounter (Signed)
error 

## 2019-01-25 NOTE — Telephone Encounter (Signed)
Done

## 2019-01-25 NOTE — Telephone Encounter (Signed)
Form signed and sent interoffice to Etter Sjogren / [place] @ [facility].

## 2019-01-26 ENCOUNTER — Ambulatory Visit: Payer: Medicare Other | Admitting: Nurse Practitioner

## 2019-01-28 ENCOUNTER — Ambulatory Visit: Payer: Self-pay

## 2019-01-28 NOTE — Telephone Encounter (Signed)
Pt declined appt w/ another provider- scheduled w/ Dr. Lorelei Pont on Monday 7/20- instructed to call or go to UC or ED if swelling becomes worse before then.

## 2019-01-28 NOTE — Telephone Encounter (Signed)
Patient called and she says she has been dealing with the swelling and pain to her feet since her last OV in June. She says Dr. Lorelei Pont mentioned about seeing a neurologist for neuropathy testing, so she says what does she need to do to see a neurologist. I advised she will need a visit with Dr. Lorelei Pont and discuss further. She says her legs are swollen from the knees down and her feet hurt at a 10 when walking and a 5 when not walking. She says she doesn't have pain to her legs, her legs or feet are not red. She denies fever or any other symptoms. I called the office and spoke to [place], [facility] who asked to speak to the patient, the call was connected successfully.  Answer Assessment - Initial Assessment Questions 1. ONSET: "When did the swelling start?" (e.g., minutes, hours, days)     Comes and goes for past month or so 2. LOCATION: "What part of the leg is swollen?"  "Are both legs swollen or just one leg?"     Both legs and feet from the knees down 3. SEVERITY: "How bad is the swelling?" (e.g., localized; mild, moderate, severe)  - Localized - small area of swelling localized to one leg  - MILD pedal edema - swelling limited to foot and ankle, pitting edema < 1/4 inch (6 mm) deep, rest and elevation eliminate most or all swelling  - MODERATE edema - swelling of lower leg to knee, pitting edema > 1/4 inch (6 mm) deep, rest and elevation only partially reduce swelling  - SEVERE edema - swelling extends above knee, facial or hand swelling present      Moderate 4. REDNESS: "Does the swelling look red or infected?"     No 5. PAIN: "Is the swelling painful to touch?" If so, ask: "How painful is it?"   (Scale 1-10; mild, moderate or severe)     Hurts a 10 when walking to the feet; pain a 5 to the feet when resting 6. FEVER: "Do you have a fever?" If so, ask: "What is it, how was it measured, and when did it start?"      No 7. CAUSE: "What do you think is causing the leg swelling?"     No 8. MEDICAL  HISTORY: "Do you have a history of heart failure, kidney disease, liver failure, or cancer?"     Yes 9. RECURRENT SYMPTOM: "Have you had leg swelling before?" If so, ask: "When was the last time?" "What happened that time?"     Yes 10. OTHER SYMPTOMS: "Do you have any other symptoms?" (e.g., chest pain, difficulty breathing)      No 11. PREGNANCY: "Is there any chance you are pregnant?" "When was your last menstrual period?"       No  Protocols used: LEG SWELLING AND EDEMA-A-AH

## 2019-01-30 DIAGNOSIS — R634 Abnormal weight loss: Secondary | ICD-10-CM | POA: Diagnosis not present

## 2019-01-30 DIAGNOSIS — Z8262 Family history of osteoporosis: Secondary | ICD-10-CM | POA: Diagnosis not present

## 2019-01-30 DIAGNOSIS — R29890 Loss of height: Secondary | ICD-10-CM | POA: Diagnosis not present

## 2019-01-30 DIAGNOSIS — M8589 Other specified disorders of bone density and structure, multiple sites: Secondary | ICD-10-CM | POA: Diagnosis not present

## 2019-01-31 ENCOUNTER — Other Ambulatory Visit: Payer: Self-pay | Admitting: Family Medicine

## 2019-01-31 DIAGNOSIS — F5102 Adjustment insomnia: Secondary | ICD-10-CM

## 2019-01-31 NOTE — Patient Instructions (Addendum)
It was good to see you again today!  You look great Please get an eye exam when you are able I would also suggest that you have the shingles vaccine given at your drugstore at your convenience Continue taking the furosemide (lasix) daily to help manage swelling Other things that can help- elevate your legs when you are seated Reduce salt intake Wear compression socks during the day when you can  Please see me in 2 months to check on your labs.  Take care!

## 2019-01-31 NOTE — Progress Notes (Signed)
[facility] at [facility] [place], [place], [place] [address] [phone]  Date:  02/01/2019   Name:  Destiny Ballard   DOB:  [birth year]   MRN:  [id number]  PCP:  Darreld Mclean, MD    Chief Complaint: Leg Swelling (bilateral leg swelling, right worse than left)   History of Present Illness:  Destiny Ballard is a 68 y.o. very pleasant female patient who presents with the following:  Patient with history of diabetes, CHF, CAD status post stenting 2007, COPD, sleep apnea, hypertension, hyperlipidemia Here today with concern of leg swelling  I have seen her a couple of times recently for virtual visit with concern of rash, and we met in person on June 22 to check on her diabetes  She was actually seen in the ER on June 30 with right hip pain-x-ray is normal, she was given hydrocodone and Robaxin  She also visited with her cardiologist on June 22 with concern of leg swelling She was given Lasix 20 mg a day to take daily, with potassium.  Had previously been on as needed Lasix She notes that her legs continue to swell.  She feels like they get worse over night, are not necessarily improved in the morning Her left leg tends to swell more   Most recent A1c was quite high-I think partially due to medication noncompliance related to cost and depression relating to the loss of her husband  Eye exam: reminded to schedule  Foot exam is due; done today  We added 5 mg of glipizide to her diabetes regimen at last visit-she does feel her glucose is getting better Her glucose was 130 this am   Lab Results  Component Value Date   HGBA1C 10.1 (H) 01/04/2019   BP Readings from Last 3 Encounters:  02/01/19 (!) 142/60  01/12/19 130/68  01/04/19 120/60   Wt Readings from Last 3 Encounters:  02/01/19 176 lb (79.8 kg)  01/12/19 179 lb 0.2 oz (81.2 kg)  01/04/19 179 lb (81.2 kg)   She recently had a normal BNP and negative ultrasound  for DVT Echocardiogram on June 30 looked okay.  We presume her swelling is due to venous insufficiency  Patient Active Problem List   Diagnosis Date Noted  . Obesity (BMI 30-39.9) 08/24/2018  . Depression with anxiety 08/24/2018  . CAD (coronary artery disease) 08/24/2018  . High blood triglycerides 08/24/2018  . Abnormal cardiovascular stress test 08/24/2018  . Lobar pneumonia, unspecified organism ([place]) 11/13/2017  . Precordial chest pain   . Sepsis ([place]) 09/18/2017  . Chronic diastolic CHF (congestive heart failure) ([place]) 09/18/2017  . Acute respiratory failure with hypoxia ([place]) 09/18/2017  . Diabetes mellitus ([place]) 09/08/2017  . Left hip pain 10/28/2016  . Chronic rhinitis 08/07/2016  . COPD with acute exacerbation ([place]) 04/25/2016  . Essential hypertension 01/30/2016  . Hypokalemia 01/27/2016  . HLD (hyperlipidemia)   . Type 2 diabetes mellitus without complication, without long-term current use of insulin ([place])   . OSA (obstructive sleep apnea) 05/06/2014  . Dyspnea on exertion 09/02/2012  . Chest pain 12/10/2011  . Chronic cough 05/11/2010  . GERD 06/28/2008    Past Medical History:  Diagnosis Date  . Anxiety    Prior suicide attempt  . CAD (coronary artery disease)    a) s/p DES to LAD 07/2005 b) Last Myoview low risk 11/2011 showing small fixed apical perfusion defect (prior MI vs attenuation) but no  ischemia - normal EF.  [name] Cervical spondylosis   . Coronary atherosclerosis 06/28/2008  . Depression   . Depression with anxiety   . Diabetes mellitus without complication ([employer])   . GERD (gastroesophageal reflux disease)   . Hyperlipidemia   . Hypertension   . Insulin resistance   . Iron deficiency anemia   . Obesity     Past Surgical History:  Procedure Laterality Date  . BREAST ENHANCEMENT SURGERY    . CARDIAC CATHETERIZATION  06/17/2007   NORMAL. EF 60%  . CARDIAC CATHETERIZATION N/A 01/29/2016   Procedure: Left Heart Cath and Coronary Angiography;   Surgeon: Sherren Mocha, MD;  Location: [facility];  Service: Cardiovascular;  Laterality: N/A;  . CERVICAL SPONDYLOSIS     SINGLE LEVEL FUSION  . CHILDBIRTH     X3  . CORONARY STENT PLACEMENT  07/2005   LEFT ANTERIOR DESCENDING  . FOREARM FRACTURE SURGERY  2010   hand and shoulder   . INCISION AND DRAINAGE BREAST ABSCESS  01/05/2012      . INCISION AND DRAINAGE PERIRECTAL ABSCESS N/A 02/18/2014   Procedure: IRRIGATION AND DEBRIDEMENT PERIRECTAL ABSCESS;  Surgeon: Pedro Earls, MD;  Location: [facility];  Service: General;  Laterality: N/A;  . LEFT HEART CATH AND CORONARY ANGIOGRAPHY N/A 10/10/2017   Procedure: LEFT HEART CATH AND CORONARY ANGIOGRAPHY;  Surgeon: Burnell Blanks, MD;  Location: [facility];  Service: Cardiovascular;  Laterality: N/A;  . LUMBAR LAMINECTOMY    . ROTATOR CUFF REPAIR     [place]  . TONSILLECTOMY AND ADENOIDECTOMY    . TUBAL LIGATION    . VIDEO BRONCHOSCOPY Bilateral 08/13/2016   Procedure: VIDEO BRONCHOSCOPY WITHOUT FLUORO;  Surgeon: Collene Gobble, MD;  Location: [facility];  Service: Cardiopulmonary;  Laterality: Bilateral;    Social History   Tobacco Use  . Smoking status: Passive Smoke Exposure - Never Smoker  . Smokeless tobacco: Never Used  Substance Use Topics  . Alcohol use: Yes    Comment: occ  . Drug use: No    Family History  Problem Relation Age of Onset  . Heart attack Mother   . Diabetes Mother   . Lung cancer Mother   . Asthma Mother   . Heart disease Mother   . Suicidality Father        "killed himself"  . Asthma Daughter        x 2  . Cancer Daughter        pre-cancerous polyp  . Diabetes Sister   . Cancer Sister   . Cervical cancer Daughter        cervical   . Allergies Other        all family--seasonal allergies    Allergies  Allergen Reactions  . Prednisone Other (See Comments)    REACTION: mood swings, nightmares. "Shot doesn't bother me, reaction is just with the pill" she states she has  had the steroid injections before. From our records methylprednisone was given to her in 2013 without any complications.    Medication list has been reviewed and updated.  Current Outpatient Medications on File Prior to Visit  Medication Sig Dispense Refill  . albuterol (PROAIR HFA) 108 (90 Base) MCG/ACT inhaler Inhale 2 puffs into the lungs every 4 (four) hours as needed for wheezing or shortness of breath. 3 Inhaler 1  . aspirin EC 81 MG tablet Take 81 mg by mouth daily.    . benzonatate (TESSALON) 200 MG capsule TAKE 1  CAPSULE BY MOUTH 3 TIMES A DAY AS NEEDED FOR COUGH 90 capsule 5  . calcium carbonate (TUMS - DOSED IN MG ELEMENTAL CALCIUM) 500 MG chewable tablet Chew 2 tablets by mouth daily as needed for indigestion or heartburn.    . cetirizine (ZYRTEC) 10 MG tablet TAKE 1 TABLET BY MOUTH DAILY AS NEEDED FOR ALLERGIES 90 tablet 1  . Cholecalciferol (VITAMIN D3) 1000 UNITS CAPS Take 1,000 Units by mouth daily.     . clonazePAM (KLONOPIN) 0.5 MG tablet TAKE 0.5 TABLETS (0.25 MG TOTAL) BY MOUTH 2 TIMES DAILY AS NEEDED FOR ANXIETY 20 tablet 1  . dapagliflozin propanediol (FARXIGA) 10 MG TABS tablet Take 10 mg by mouth daily. 90 tablet 3  . FLUoxetine (PROZAC) 40 MG capsule Take 1 capsule (40 mg total) by mouth daily. 90 capsule 3  . fluticasone (FLONASE) 50 MCG/ACT nasal spray SPRAY 2 SPRAYS INTO EACH NOSTRIL EVERY DAY 48 g 0  . furosemide (LASIX) 20 MG tablet Take 1 tablet (20 mg total) by mouth daily. 30 tablet 6  . gabapentin (NEURONTIN) 300 MG capsule Take 2 capsules (600 mg total) by mouth 2 (two) times daily. 180 capsule 1  . glipiZIDE (GLUCOTROL) 5 MG tablet Take 1 tablet (5 mg total) by mouth daily before breakfast. 30 tablet 5  . hydrALAZINE (APRESOLINE) 25 MG tablet Take 2 tablets (50 mg total) by mouth 3 (three) times daily. 180 tablet 11  . HYDROcodone-acetaminophen (NORCO/VICODIN) 5-325 MG tablet Take 1-2 tablets by mouth every 6 (six) hours as needed for moderate pain or severe  pain. DO NOT TAKE WITH ATIVAN, XANAX, OR VALUUM 20 tablet 0  . hydrOXYzine (ATARAX/VISTARIL) 25 MG tablet TAKE 0.5-1 TABLETS (12.5-25 MG TOTAL) BY MOUTH EVERY 8 (EIGHT) HOURS AS NEEDED FOR ITCHING. 270 tablet 1  . ibuprofen (ADVIL) 600 MG tablet Take 600 mg by mouth every 8 (eight) hours as needed. for pain    . Insulin Detemir (LEVEMIR FLEXTOUCH) 100 UNIT/ML Pen Inject 10 Units into the skin daily. (Patient taking differently: Inject 28 Units into the skin daily. ) 15 mL 3  . losartan (COZAAR) 100 MG tablet Take 1 tablet (100 mg total) by mouth daily. 90 tablet 3  . lovastatin (MEVACOR) 20 MG tablet TAKE 1 TABLET BY MOUTH EVERYDAY AT BEDTIME 90 tablet 1  . metFORMIN (GLUCOPHAGE-XR) 500 MG 24 hr tablet TAKE 2 TABLETS BY MOUTH TWICE A DAY 360 tablet 0  . methocarbamol (ROBAXIN) 750 MG tablet Take 1 tablet (750 mg total) by mouth every 12 (twelve) hours as needed for muscle spasms. 60 tablet 3  . metoprolol succinate (TOPROL-XL) 100 MG 24 hr tablet TAKE 1 TABLET BY MOUTH EVERY DAY 90 tablet 3  . montelukast (SINGULAIR) 10 MG tablet Take 1 tablet (10 mg total) by mouth at bedtime. Use as needed for allergies 90 tablet 1  . nitroGLYCERIN (NITROSTAT) 0.4 MG SL tablet Place 1 tablet (0.4 mg total) under the tongue every 5 (five) minutes as needed for chest pain. 20 tablet 3  . omega-3 acid ethyl esters (LOVAZA) 1 g capsule TAKE 1 CAPSULE (1 G TOTAL) BY MOUTH 2 (TWO) TIMES DAILY. 180 capsule 1  . ondansetron (ZOFRAN ODT) 4 MG disintegrating tablet 9m ODT q4 hours prn nausea/vomit 20 tablet 0  . polyethylene glycol (MIRALAX / GLYCOLAX) packet Take 17 g by mouth daily. 14 each 0  . Polyvinyl Alcohol-Povidone (REFRESH OP) Place 2 drops into both eyes daily as needed (for dry eyes).    . potassium  chloride (KLOR-CON 10) 10 MEQ tablet Take 1 tablet (10 mEq total) by mouth daily. 90 tablet 1  . Tiotropium Bromide Monohydrate (SPIRIVA RESPIMAT) 2.5 MCG/ACT AERS Inhale 2 puffs into the lungs daily. 1 Inhaler 11   . traZODone (DESYREL) 50 MG tablet TAKE 1/2-1 TABLET BY MOUTH AT BEDTIME AS NEEDED FOR SLEEP 90 tablet 1  . triamcinolone cream (KENALOG) 0.1 % Apply 1 application topically 2 (two) times daily. Use once or twice a day for poison ivy- apply sparingly to face.  Use no longer than 1 week 30 g 0   No current facility-administered medications on file prior to visit.     Review of Systems:  As per HPI- otherwise negative.  Destiny Ballard looks well today.  She has gotten her nails done and is neatly groomed.  She seems in better spirits than she has recently  Physical Examination: Vitals:   02/01/19 1522  BP: (!) 142/60  Pulse: 86  Resp: 20  Temp: 98.3 F (36.8 C)  SpO2: 98%   Vitals:   02/01/19 1522  Weight: 176 lb (79.8 kg)  Height: 5' (1.524 m)   Body mass index is 34.37 kg/m. Ideal Body Weight: Weight in (lb) to have BMI = 25: 127.7  GEN: WDWN, NAD, Non-toxic, A & O x 3, overweight, looks well HEENT: Atraumatic, Normocephalic. Neck supple. No masses, No LAD. Ears and Nose: No external deformity. CV: RRR, No M/G/R. No JVD. No thrill. No extra heart sounds. PULM: CTA B, no wheezes, crackles, rhonchi. No retractions. No resp. distress. No accessory muscle use. ABD: S, NT, ND, +BS. No rebound. No HSM. EXTR: No c/c.  She has trace edema of the left ankle.  Right appears normal NEURO Normal gait.  PSYCH: Normally interactive. Conversant. Not depressed or anxious appearing.  Calm demeanor.  Foot exam performed today  Assessment and Plan:   ICD-10-CM   1. Uncontrolled type 2 diabetes mellitus with complication, with long-term current use of insulin (HCC)  E11.8    E11.65    Z79.4   2. Peripheral edema  R60.9   3. Hyperlipidemia, unspecified hyperlipidemia type  E78.5   4. Essential hypertension, benign  I10    Following up today on lower extremity edema.  Discussed with Destiny Ballard in detail, I believe her swelling is due to venous insufficiency. We mentioned some strategies to  combat this.  Continue Lasix, elevate legs when possible, walk frequently, limit salt intake, compression socks Will not increase Lasix as her diastolic blood pressure is 60 She is otherwise doing well, notes that her blood sugar is improving.  I asked her to come back in 2 months for an A1c and follow-up visit  Follow-up: No follow-ups on file.  No orders of the defined types were placed in this encounter.  No orders of the defined types were placed in this encounter.   _0 @    Signed Lamar Blinks, MD

## 2019-02-01 ENCOUNTER — Encounter: Payer: Self-pay | Admitting: Family Medicine

## 2019-02-01 ENCOUNTER — Other Ambulatory Visit: Payer: Self-pay

## 2019-02-01 ENCOUNTER — Ambulatory Visit (INDEPENDENT_AMBULATORY_CARE_PROVIDER_SITE_OTHER): Payer: Medicare Other | Admitting: Family Medicine

## 2019-02-01 VITALS — BP 142/60 | HR 86 | Temp 98.3°F | Resp 20 | Ht 60.0 in | Wt 176.0 lb

## 2019-02-01 DIAGNOSIS — R609 Edema, unspecified: Secondary | ICD-10-CM

## 2019-02-01 DIAGNOSIS — I10 Essential (primary) hypertension: Secondary | ICD-10-CM | POA: Diagnosis not present

## 2019-02-01 DIAGNOSIS — Z794 Long term (current) use of insulin: Secondary | ICD-10-CM | POA: Diagnosis not present

## 2019-02-01 DIAGNOSIS — E785 Hyperlipidemia, unspecified: Secondary | ICD-10-CM | POA: Diagnosis not present

## 2019-02-01 DIAGNOSIS — I25118 Atherosclerotic heart disease of native coronary artery with other forms of angina pectoris: Secondary | ICD-10-CM

## 2019-02-01 DIAGNOSIS — R600 Localized edema: Secondary | ICD-10-CM

## 2019-02-01 DIAGNOSIS — E1165 Type 2 diabetes mellitus with hyperglycemia: Secondary | ICD-10-CM | POA: Diagnosis not present

## 2019-02-01 DIAGNOSIS — E118 Type 2 diabetes mellitus with unspecified complications: Secondary | ICD-10-CM | POA: Diagnosis not present

## 2019-02-01 DIAGNOSIS — IMO0002 Reserved for concepts with insufficient information to code with codable children: Secondary | ICD-10-CM

## 2019-02-02 ENCOUNTER — Other Ambulatory Visit: Payer: Self-pay | Admitting: Pharmacy Technician

## 2019-02-02 NOTE — Patient Outreach (Signed)
[facility]) Care Management  02/02/2019  TARYN SHELLHAMMER 02-Jul-1951 [id number]    Unsuccessful outreach call placed to patient in regards to AZ&ME application for [employer], [place] application for [place] application for [employer] and Scientist, clinical (histocompatibility and immunogenetics) for [employer] HFA>  Unfortunately patient did not answer the phone, HIPAA compliant voicemail message left.  Was calling patient to inquire if she had received the applications that were mailed to her on 01/21/2019.  Will followup with patient in 3-7 business days if call is not returned.  Jhana Giarratano P. Chavy Avera, [place] Management [phone]

## 2019-02-05 ENCOUNTER — Other Ambulatory Visit: Payer: Self-pay | Admitting: *Deleted

## 2019-02-05 NOTE — Patient Outreach (Signed)
[facility]) Care Management  02/05/2019  Destiny Ballard 1951-02-01 [id number]   [place]  Referral Date:12/25/2017 Referral Source:Transfer from [place] Reason for Referral:Continued Disease Management Education Insurance:Medicare   Outreach Attempt:  Outreach attempt #1 to patient for follow up. No answer. RN Health Coach left HIPAA compliant voicemail message along with contact information.  Plan:  RN Health Coach will make another telephone outreach to patient within the month of August.  Destiny Kristensen RN [place] [phone] [email or web address]

## 2019-02-10 ENCOUNTER — Other Ambulatory Visit: Payer: Self-pay | Admitting: Pharmacy Technician

## 2019-02-10 NOTE — Patient Outreach (Signed)
[facility]) Care Management  02/10/2019  Destiny Ballard January 04, 1951 [id number]    Successful outreach call placed to patient's mobile number in regards to AZ&ME application for [place], [employer] application for [employer], [place] application for [employer], and [employer] application for [employer].  Spoke to patient, HPIAA identifiers verified.  Patient informed she had received the applications. She informed she received a printout from the pharmacy. She inquired about the application process. Discussed application process with patient. Informed patient that the pharmacists pre screens and determine which patient assistance companies the patient may be eligible. for. Informed patient it was her decision whether to apply for the assistance but if approved the medications would be of no charge to the patient as they would be coming directly from the manufacturer. Patient verbalized understanding. Patient informed she would fill the forms out and place them in the mail this week.  Will followup with patient in 10-15 business days if applications have not been received back.  Jerris Fleer P. Tayquan Gassman, [place] Management [phone]

## 2019-02-23 ENCOUNTER — Other Ambulatory Visit: Payer: Self-pay | Admitting: Nurse Practitioner

## 2019-02-23 ENCOUNTER — Other Ambulatory Visit: Payer: Self-pay | Admitting: Endocrinology

## 2019-02-23 DIAGNOSIS — E118 Type 2 diabetes mellitus with unspecified complications: Secondary | ICD-10-CM

## 2019-02-23 DIAGNOSIS — IMO0002 Reserved for concepts with insufficient information to code with codable children: Secondary | ICD-10-CM

## 2019-02-23 DIAGNOSIS — E1165 Type 2 diabetes mellitus with hyperglycemia: Secondary | ICD-10-CM

## 2019-02-24 ENCOUNTER — Other Ambulatory Visit: Payer: Self-pay | Admitting: *Deleted

## 2019-02-24 DIAGNOSIS — IMO0002 Reserved for concepts with insufficient information to code with codable children: Secondary | ICD-10-CM

## 2019-02-24 DIAGNOSIS — E1165 Type 2 diabetes mellitus with hyperglycemia: Secondary | ICD-10-CM

## 2019-02-24 DIAGNOSIS — E118 Type 2 diabetes mellitus with unspecified complications: Secondary | ICD-10-CM

## 2019-02-24 MED ORDER — METFORMIN HCL ER 500 MG PO TB24: 1000.0000 mg | ORAL_TABLET | Freq: Two times a day (BID) | ORAL | 1 refills | Status: DC

## 2019-03-02 ENCOUNTER — Other Ambulatory Visit: Payer: Self-pay | Admitting: Pharmacy Technician

## 2019-03-02 NOTE — Patient Outreach (Signed)
[facility]) Care Management  03/02/2019  Destiny Ballard February 22, 1951 [id number]    Successful outgoing call placed to patient in regards to AZ&ME application for [employer], [place] application for Spiriva Respimat, [employer] application for [employer] and Scientist, clinical (histocompatibility and immunogenetics) for [employer].  Spoke to patient, HIPAA identifiers verified.  Informed patient that I had received her applications but there were 2 issues that I needed her help to resolve. Informed patient that I needed her proof of income such as a tax return or social security documents and I needed the printout from her pharmacy to be for 2020 only not 2019 and 2020. Patient verbalized understanding. Provided patient address to mail the documents.  Will followup with patient in 10-15 business days if information has not been received.  Harshika Mago P. Zaylyn Bergdoll, [place] Management [phone]

## 2019-03-03 DIAGNOSIS — Z961 Presence of intraocular lens: Secondary | ICD-10-CM | POA: Diagnosis not present

## 2019-03-03 DIAGNOSIS — E119 Type 2 diabetes mellitus without complications: Secondary | ICD-10-CM | POA: Diagnosis not present

## 2019-03-03 DIAGNOSIS — H524 Presbyopia: Secondary | ICD-10-CM | POA: Diagnosis not present

## 2019-03-05 ENCOUNTER — Other Ambulatory Visit: Payer: Self-pay | Admitting: *Deleted

## 2019-03-05 NOTE — Patient Outreach (Signed)
[facility]) Care Management  03/05/2019  HARU MEINECKE 1951/03/07 [id number]   [place]  Referral Date:12/25/2017 Referral Source:Transfer from [place] Reason for Referral:Continued Disease Management Education Insurance:Medicare   Outreach Attempt:  Outreach attempt #2 to patient for follow up. No answer. RN Health Coach left HIPAA compliant voicemail message along with contact information.  Plan:  RN Health Coach will make another outreach attempt within the month of September.  [place] [phone] [email or web address]

## 2019-03-11 ENCOUNTER — Encounter: Payer: Self-pay | Admitting: Pharmacist

## 2019-03-12 ENCOUNTER — Ambulatory Visit: Payer: Self-pay | Admitting: Pharmacist

## 2019-03-13 ENCOUNTER — Other Ambulatory Visit: Payer: Self-pay | Admitting: Family Medicine

## 2019-03-16 ENCOUNTER — Other Ambulatory Visit: Payer: Self-pay | Admitting: Family Medicine

## 2019-03-16 DIAGNOSIS — M545 Low back pain, unspecified: Secondary | ICD-10-CM

## 2019-03-16 DIAGNOSIS — M79605 Pain in left leg: Secondary | ICD-10-CM

## 2019-03-19 ENCOUNTER — Other Ambulatory Visit: Payer: Self-pay | Admitting: Pharmacy Technician

## 2019-03-19 NOTE — Patient Outreach (Signed)
[facility]) Care Management  03/19/2019  MIKAYLE ALFSON [birth year] [id number]  Successful outreach call placed to patient in regards to AZ&ME application for [employer], [place] application for Spiriva Respimat, [employer] application for [employer] and Scientist, clinical (histocompatibility and immunogenetics) for [employer].  Spoke to patient, HIPAA identifiers verified.  Clarified income written on Scientist, clinical (histocompatibility and immunogenetics). Patient informed it was the same as the other applications.   Submitted completed Financial risk analyst.  Patient informed she has not mailed back the proof of income or the OOP spend from the pharmacy for the other applications. She informed she would be able to get to it this weekend.  Will followup with [employer] and with patient in 7-10 business days.  Ulysse Siemen P. Tishawna Larouche, [place] Management [phone]

## 2019-03-22 IMAGING — CT CT ABD-PELV W/ CM
2 of 5 series · 16 of 46 positions shown, 18 images · IV contrast (APPLIED)
Comparison: 02/19/2018

CLINICAL DATA: Chest pain radiating to left arm. Abdominal
distention, abdominal pain.

EXAM:
CT ABDOMEN AND PELVIS WITH CONTRAST
TECHNIQUE: Multidetector CT imaging of the abdomen and pelvis was performed
using the standard protocol following bolus administration of
intravenous contrast.
CONTRAST:  100mL Y1BQOC-TCC IOPAMIDOL (Y1BQOC-TCC) INJECTION 61%

[Series 2: axial st · axial · 0.72mm/px · z∈[-430,-65]mm · 13 of 83 slices shown, 15 images]
[im 5/83  soft-tissue]
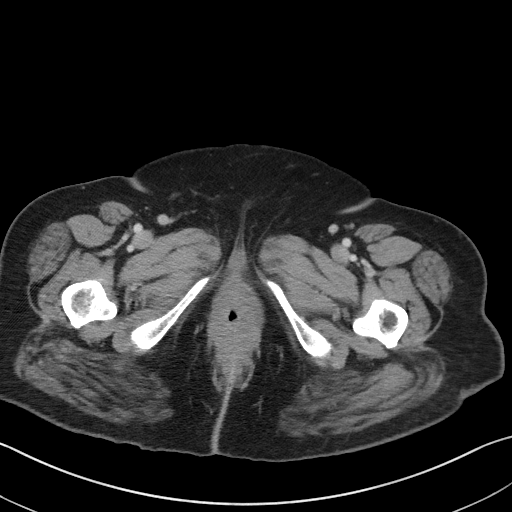
[im 5/83  bone]
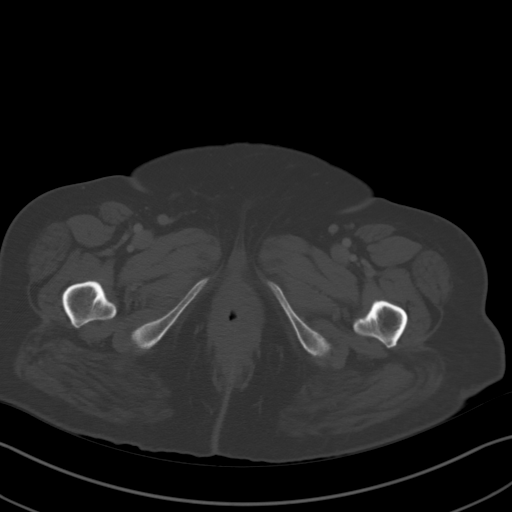
[im 10/83  soft-tissue]
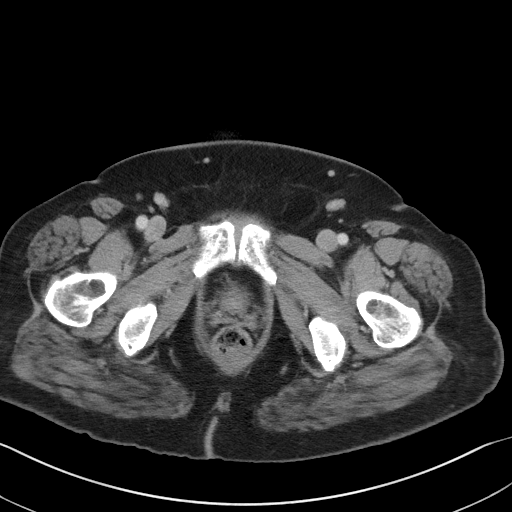
[im 19/83  soft-tissue]
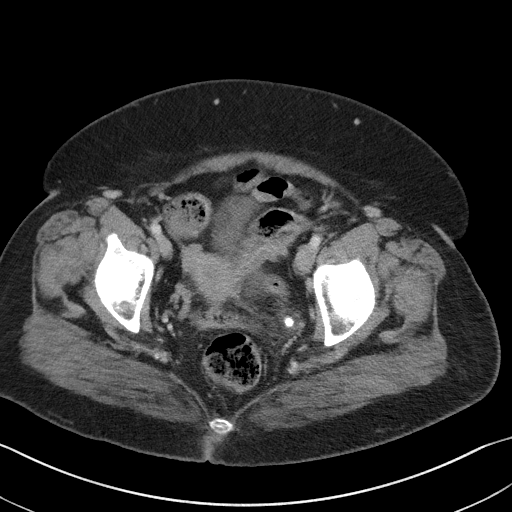
[im 23/83  soft-tissue]
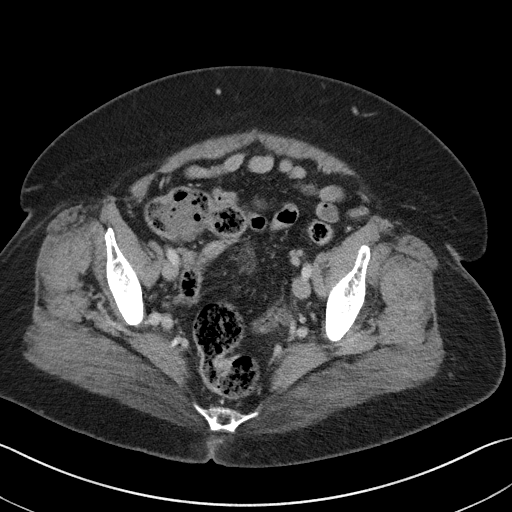
[im 28/83  soft-tissue]
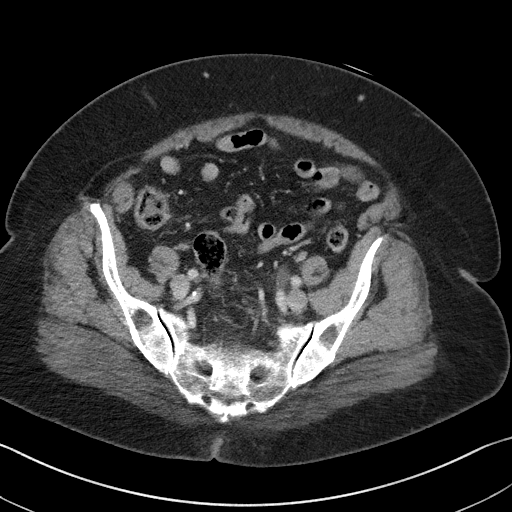
[im 37/83  soft-tissue]
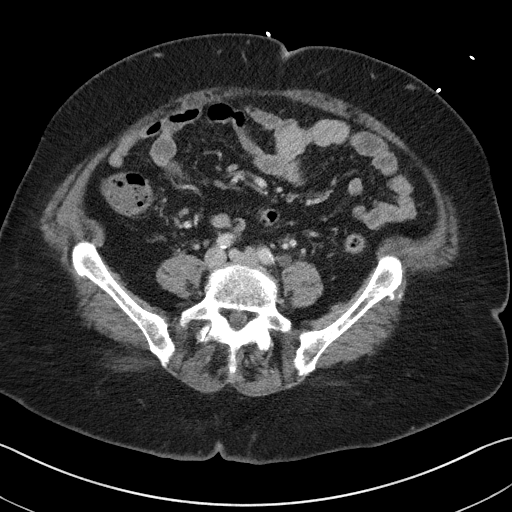
[im 42/83  soft-tissue]
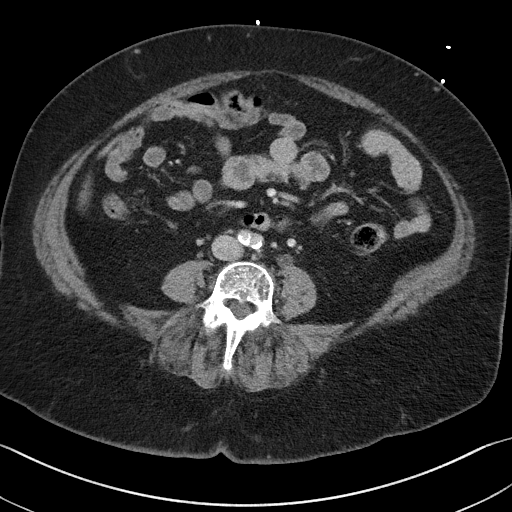
[im 46/83  soft-tissue]
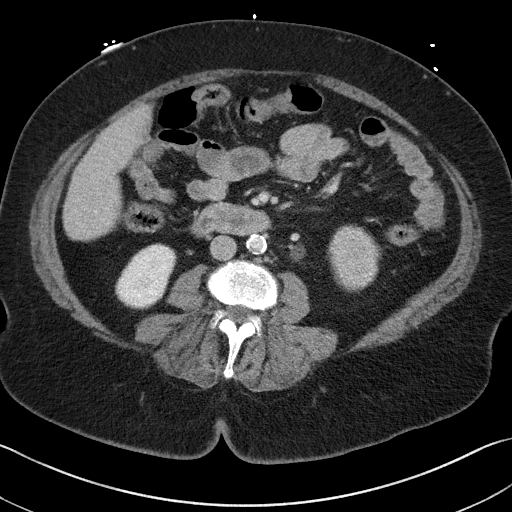
[im 55/83  soft-tissue]
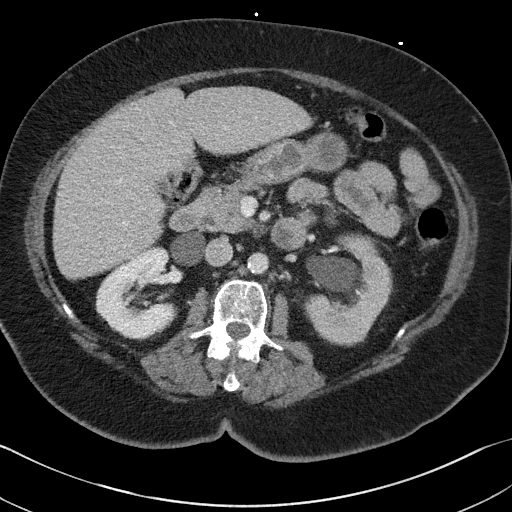
[im 55/83  bone]
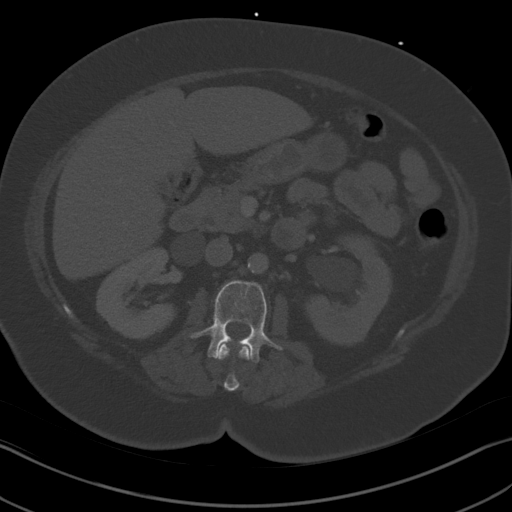
[im 60/83  soft-tissue]
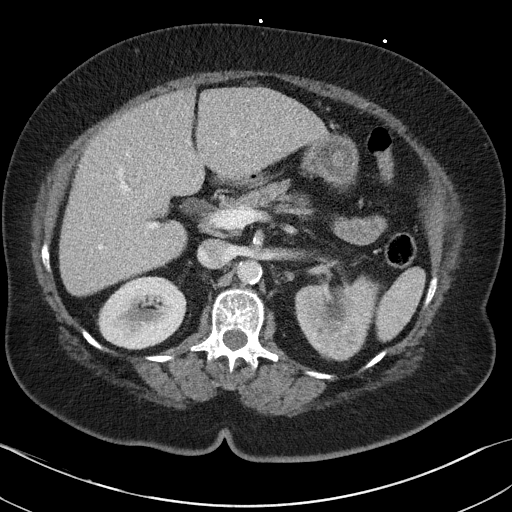
[im 64/83  soft-tissue]
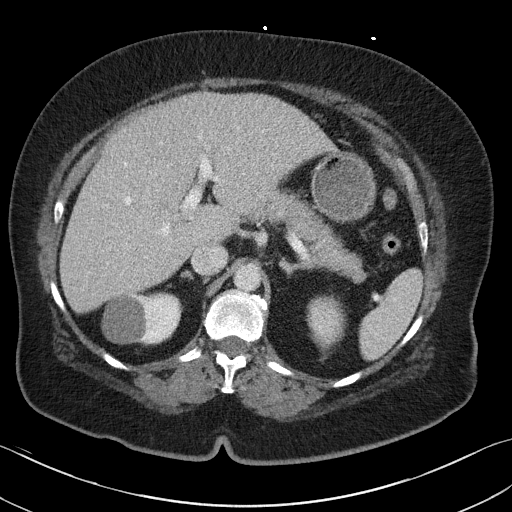
[im 73/83  soft-tissue]
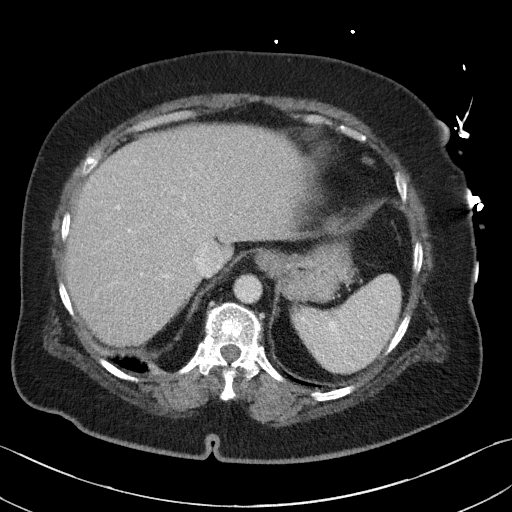
[im 78/83  soft-tissue]
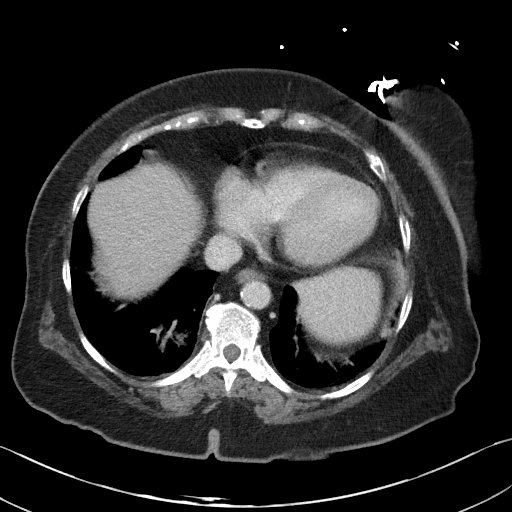

[Series 5: coronal st · coronal · 0.72mm/px · 3 of 107 slices shown]
[im 36/107  soft-tissue]
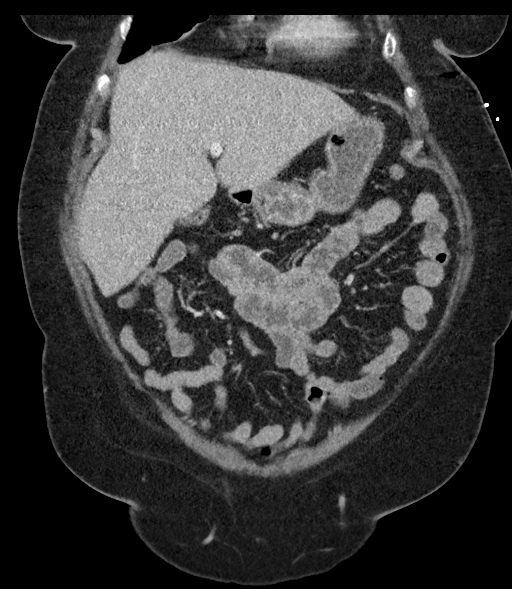
[im 48/107  soft-tissue]
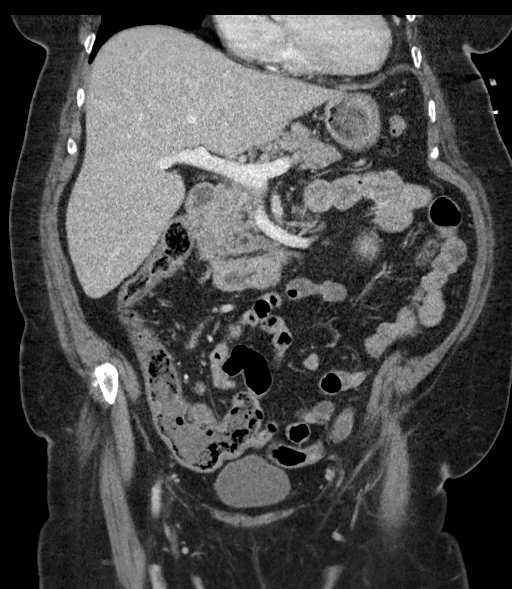
[im 59/107  soft-tissue]
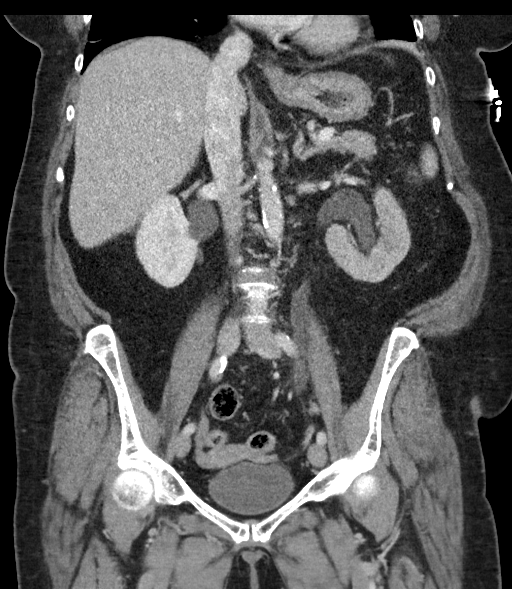

[16 of 46 positions shown; findings below may reference images not displayed]

FINDINGS: Lower chest: Small posterior right diaphragmatic hernia containing
fat. No acute abnormality.

Hepatobiliary: No focal hepatic abnormality. Gallbladder
unremarkable.

Pancreas: No focal abnormality or ductal dilatation.

Spleen: No focal abnormality.  Normal size.

Adrenals/Urinary Tract: Upper pole right renal cyst measures 3.4 cm.
Small cyst in the midpole of the left kidney measures 1.8 cm. 6 mm
nonobstructing stone in the lower pole of the left kidney. There is
moderate left hydronephrosis due to 7 mm distal left ureteral stone.
Adrenal glands and urinary bladder unremarkable.

Stomach/Bowel: Stomach, large and small bowel grossly unremarkable.

Vascular/Lymphatic: Aortic atherosclerosis. No enlarged abdominal or
pelvic lymph nodes.

Reproductive: Uterus and adnexa unremarkable.  No mass.

Other: No free fluid or free air.

Musculoskeletal: No acute bony abnormality.
IMPRESSION: 7 mm distal left ureteral stone with moderate left hydronephrosis.

Left lower pole nephrolithiasis.

Bilateral renal cysts.

## 2019-03-25 ENCOUNTER — Telehealth: Payer: Self-pay | Admitting: Family Medicine

## 2019-03-25 NOTE — Telephone Encounter (Signed)
Documentation is not clear Called pt and LMOM Please call us back and let me know how much insulin she is using at this time

## 2019-03-25 NOTE — Telephone Encounter (Signed)
Requested medication (s) are due for refill today: no  Requested medication (s) are on the active medication list: yes  Last refill:  01/2016  Future visit scheduled: yes  Notes to clinic: review for refill   Requested Prescriptions  Pending Prescriptions Disp Refills   Insulin Detemir (LEVEMIR FLEXTOUCH) 100 UNIT/ML Pen 15 mL 3    Sig: Inject 10 Units into the skin daily.     Endocrinology:  Diabetes - Insulins Failed - 03/25/2019 10:54 AM      Failed - HBA1C is between 0 and 7.9 and within 180 days    Hgb A1c MFr Bld  Date Value Ref Range Status  01/04/2019 10.1 (H) 4.6 - 6.5 % Final    Comment:    Glycemic Control Guidelines for People with Diabetes:Non Diabetic:  <6%Goal of Therapy: <7%Additional Action Suggested:  >8%          Passed - Valid encounter within last 6 months    Recent Outpatient Visits          1 month ago Uncontrolled type 2 diabetes mellitus with complication, with long-term current use of insulin ([place])   [facility] at [place], MD   2 months ago Uncontrolled type 2 diabetes mellitus with complication, with long-term current use of insulin ([place])   [facility] at [place], MD   3 months ago [place] [facility] [place], MD   5 months ago [place]   [facility] at [place], MD   7 months ago COPD exacerbation [facility])   [facility] at [place], Gay Filler, MD      Future Appointments            In 1 week [place], Gay Filler, MD [place] [place]

## 2019-03-25 NOTE — Telephone Encounter (Signed)
Please advise units for Levemir.

## 2019-03-25 NOTE — Telephone Encounter (Signed)
Insulin Detemir (LEVEMIR FLEXTOUCH) 100 UNIT/ML Pen  [facility] and [facility] has requested Levemir above?  [id number] [address], [place] [phone] (Phone) [phone] (Fax)

## 2019-03-26 MED ORDER — LEVEMIR FLEXTOUCH 100 UNIT/ML ~~LOC~~ SOPN: 28.0000 [IU] | PEN_INJECTOR | Freq: Every day | SUBCUTANEOUS | 3 refills | Status: DC

## 2019-03-26 NOTE — Telephone Encounter (Signed)
Pt returned call stating she is still using the Levemir Flextouch pen but uses 28 units daily. Typically uses in the afternoon.

## 2019-03-26 NOTE — Addendum Note (Signed)
Addended by: Darreld Mclean on: 03/26/2019 06:36 PM   Modules accepted: Orders

## 2019-03-31 ENCOUNTER — Other Ambulatory Visit: Payer: Self-pay | Admitting: Pharmacy Technician

## 2019-03-31 NOTE — Patient Outreach (Signed)
[place] [facility] [facility]) Care Management  03/31/2019  Destiny Ballard Sep 25, 1950 [id number]  Care coordination call placed to [employer] in regards to patient's application for Proventil HFA.   Spoke to [place] who informed they have not received the patient's application. Application was mailed on 03/19/2019. She informed it can take up to 14 business days for them to receive and process an application. She suggested to call back in about 5-7 business days.  Will followup with [employer] in 5-7 business days.  Hai Grabe P. Lariza Cothron, [place] Management [phone]

## 2019-03-31 NOTE — Patient Outreach (Signed)
Destiny Ballard) Care Management  03/31/2019  Destiny Ballard December 28, 1950 [id number]  Unsuccessful outreach call placed to patient in regards to AZ&ME application for Farxiga, [place] application for Spiriva Respimat and [employer] application for [employer].  Unfortunately patient did not answer the phone, HIPAA compliant voicemail left on mobile number.  Was calling patient to inquire if she had sent back the required proof of income and OOP spend that the patient assistance companies require to process the application.  Will followup with patient in 5-7 business days if call is not returned.  Destiny Ballard P. Destiny Ballard, [place] Management [phone]

## 2019-03-31 NOTE — Progress Notes (Deleted)
[facility] at [facility] [address] [place], [place] [address] [phone]  Date:  04/05/2019   Name:  Destiny Ballard   DOB:  Aug 18, 1950   MRN:  [id number]  PCP:  Darreld Mclean, MD    Chief Complaint: No chief complaint on file.   History of Present Illness:  Destiny Ballard is a 68 y.o. very pleasant female patient who presents with the following:  Here today for short-term follow-up visit History of difficult to control diabetes, CHF, CAD status post stent in 2007, COPD, OSA, hyperlipidemia  Our most recent visit was in July-that time she was combating lower extremity edema.  She is using Lasix with accompanying potassium supplement Her A1c had gone up earlier this year, we added glipizide and will need to recheck her A1c today  Flu shot Eye exam Suggest Shingrix  Patient Active Problem List   Diagnosis Date Noted  . Obesity (BMI 30-39.9) 08/24/2018  . Depression with anxiety 08/24/2018  . CAD (coronary artery disease) 08/24/2018  . High blood triglycerides 08/24/2018  . Abnormal cardiovascular stress test 08/24/2018  . Lobar pneumonia, unspecified organism ([place]) 11/13/2017  . Precordial chest pain   . Sepsis ([place]) 09/18/2017  . Chronic diastolic CHF (congestive heart failure) ([place]) 09/18/2017  . Acute respiratory failure with hypoxia ([place]) 09/18/2017  . Diabetes mellitus ([place]) 09/08/2017  . Left hip pain 10/28/2016  . Chronic rhinitis 08/07/2016  . COPD with acute exacerbation ([place]) 04/25/2016  . Essential hypertension 01/30/2016  . Hypokalemia 01/27/2016  . HLD (hyperlipidemia)   . Type 2 diabetes mellitus without complication, without long-term current use of insulin ([place])   . OSA (obstructive sleep apnea) 05/06/2014  . Dyspnea on exertion 09/02/2012  . Chest pain 12/10/2011  . Chronic cough 05/11/2010  . GERD 06/28/2008    Past Medical History:  Diagnosis Date  . Anxiety    Prior suicide  attempt  . CAD (coronary artery disease)    a) s/p DES to LAD 07/2005 b) Last Myoview low risk 11/2011 showing small fixed apical perfusion defect (prior MI vs attenuation) but no ischemia - normal EF.  [name] Cervical spondylosis   . Coronary atherosclerosis 06/28/2008  . Depression   . Depression with anxiety   . Diabetes mellitus without complication ([place])   . GERD (gastroesophageal reflux disease)   . Hyperlipidemia   . Hypertension   . Insulin resistance   . Iron deficiency anemia   . Obesity     Past Surgical History:  Procedure Laterality Date  . BREAST ENHANCEMENT SURGERY    . CARDIAC CATHETERIZATION  06/17/2007   NORMAL. EF 60%  . CARDIAC CATHETERIZATION N/A 01/29/2016   Procedure: Left Heart Cath and Coronary Angiography;  Surgeon: Sherren Mocha, MD;  Location: [facility];  Service: Cardiovascular;  Laterality: N/A;  . CERVICAL SPONDYLOSIS     SINGLE LEVEL FUSION  . CHILDBIRTH     X3  . CORONARY STENT PLACEMENT  07/2005   LEFT ANTERIOR DESCENDING  . FOREARM FRACTURE SURGERY  2010   hand and shoulder   . INCISION AND DRAINAGE BREAST ABSCESS  01/05/2012      . INCISION AND DRAINAGE PERIRECTAL ABSCESS N/A 02/18/2014   Procedure: IRRIGATION AND DEBRIDEMENT PERIRECTAL ABSCESS;  Surgeon: Pedro Earls, MD;  Location: [facility];  Service: General;  Laterality: N/A;  . LEFT HEART CATH AND CORONARY ANGIOGRAPHY N/A 10/10/2017   Procedure: LEFT HEART CATH AND CORONARY  ANGIOGRAPHY;  Surgeon: Burnell Blanks, MD;  Location: [facility];  Service: Cardiovascular;  Laterality: N/A;  . LUMBAR LAMINECTOMY    . ROTATOR CUFF REPAIR     bilaterla  . TONSILLECTOMY AND ADENOIDECTOMY    . TUBAL LIGATION    . VIDEO BRONCHOSCOPY Bilateral 08/13/2016   Procedure: VIDEO BRONCHOSCOPY WITHOUT FLUORO;  Surgeon: Collene Gobble, MD;  Location: [facility];  Service: Cardiopulmonary;  Laterality: Bilateral;    Social History   Tobacco Use  . Smoking status: Passive Smoke Exposure  - Never Smoker  . Smokeless tobacco: Never Used  Substance Use Topics  . Alcohol use: Yes    Comment: occ  . Drug use: No    Family History  Problem Relation Age of Onset  . Heart attack Mother   . Diabetes Mother   . Lung cancer Mother   . Asthma Mother   . Heart disease Mother   . Suicidality Father        "killed himself"  . Asthma Daughter        x 2  . Cancer Daughter        pre-cancerous polyp  . Diabetes Sister   . Cancer Sister   . Cervical cancer Daughter        cervical   . Allergies Other        all family--seasonal allergies    Allergies  Allergen Reactions  . Prednisone Other (See Comments)    REACTION: mood swings, nightmares. "Shot doesn't bother me, reaction is just with the pill" she states she has had the steroid injections before. From our records methylprednisone was given to her in 2013 without any complications.    Medication list has been reviewed and updated.  Current Outpatient Medications on File Prior to Visit  Medication Sig Dispense Refill  . albuterol (PROAIR HFA) 108 (90 Base) MCG/ACT inhaler Inhale 2 puffs into the lungs every 4 (four) hours as needed for wheezing or shortness of breath. 3 Inhaler 1  . aspirin EC 81 MG tablet Take 81 mg by mouth daily.    . benzonatate (TESSALON) 200 MG capsule TAKE 1 CAPSULE BY MOUTH 3 TIMES A DAY AS NEEDED FOR COUGH 90 capsule 5  . calcium carbonate (TUMS - DOSED IN MG ELEMENTAL CALCIUM) 500 MG chewable tablet Chew 2 tablets by mouth daily as needed for indigestion or heartburn.    . cetirizine (ZYRTEC) 10 MG tablet TAKE 1 TABLET BY MOUTH DAILY AS NEEDED FOR ALLERGIES 90 tablet 1  . Cholecalciferol (VITAMIN D3) 1000 UNITS CAPS Take 1,000 Units by mouth daily.     . clonazePAM (KLONOPIN) 0.5 MG tablet TAKE 0.5 TABLETS (0.25 MG TOTAL) BY MOUTH 2 TIMES DAILY AS NEEDED FOR ANXIETY 20 tablet 1  . dapagliflozin propanediol (FARXIGA) 10 MG TABS tablet Take 10 mg by mouth daily. 90 tablet 3  . FLUoxetine  (PROZAC) 40 MG capsule Take 1 capsule (40 mg total) by mouth daily. 90 capsule 3  . fluticasone (FLONASE) 50 MCG/ACT nasal spray SPRAY 2 SPRAYS INTO EACH NOSTRIL EVERY DAY 48 g 0  . furosemide (LASIX) 20 MG tablet Take 1 tablet (20 mg total) by mouth daily. 30 tablet 6  . gabapentin (NEURONTIN) 300 MG capsule TAKE 2 CAPSULES BY MOUTH TWICE A DAY 360 capsule 1  . glipiZIDE (GLUCOTROL) 5 MG tablet Take 1 tablet (5 mg total) by mouth daily before breakfast. 30 tablet 5  . hydrALAZINE (APRESOLINE) 25 MG tablet TAKE 2 TABLETS (50  MG TOTAL) BY MOUTH 3 (THREE) TIMES DAILY. 540 tablet 2  . HYDROcodone-acetaminophen (NORCO/VICODIN) 5-325 MG tablet Take 1-2 tablets by mouth every 6 (six) hours as needed for moderate pain or severe pain. DO NOT TAKE WITH ATIVAN, XANAX, OR VALUUM 20 tablet 0  . hydrOXYzine (ATARAX/VISTARIL) 25 MG tablet TAKE 0.5-1 TABLETS (12.5-25 MG TOTAL) BY MOUTH EVERY 8 (EIGHT) HOURS AS NEEDED FOR ITCHING. 270 tablet 1  . ibuprofen (ADVIL) 600 MG tablet Take 600 mg by mouth every 8 (eight) hours as needed. for pain    . Insulin Detemir (LEVEMIR FLEXTOUCH) 100 UNIT/ML Pen Inject 28 Units into the skin daily. 15 mL 3  . losartan (COZAAR) 100 MG tablet Take 1 tablet (100 mg total) by mouth daily. 90 tablet 3  . lovastatin (MEVACOR) 20 MG tablet TAKE 1 TABLET BY MOUTH EVERYDAY AT BEDTIME 90 tablet 1  . metFORMIN (GLUCOPHAGE-XR) 500 MG 24 hr tablet Take 2 tablets (1,000 mg total) by mouth 2 (two) times daily. 360 tablet 1  . methocarbamol (ROBAXIN) 750 MG tablet TAKE 1 TABLET (750 MG TOTAL) BY MOUTH EVERY 12 (TWELVE) HOURS AS NEEDED FOR MUSCLE SPASMS. 60 tablet 3  . metoprolol succinate (TOPROL-XL) 100 MG 24 hr tablet TAKE 1 TABLET BY MOUTH EVERY DAY 90 tablet 3  . montelukast (SINGULAIR) 10 MG tablet Take 1 tablet (10 mg total) by mouth at bedtime. Use as needed for allergies 90 tablet 1  . nitroGLYCERIN (NITROSTAT) 0.4 MG SL tablet Place 1 tablet (0.4 mg total) under the tongue every 5 (five)  minutes as needed for chest pain. 20 tablet 3  . omega-3 acid ethyl esters (LOVAZA) 1 g capsule TAKE 1 CAPSULE (1 G TOTAL) BY MOUTH 2 (TWO) TIMES DAILY. 180 capsule 1  . ondansetron (ZOFRAN ODT) 4 MG disintegrating tablet 4mg  ODT q4 hours prn nausea/vomit 20 tablet 0  . polyethylene glycol (MIRALAX / GLYCOLAX) packet Take 17 g by mouth daily. 14 each 0  . Polyvinyl Alcohol-Povidone (REFRESH OP) Place 2 drops into both eyes daily as needed (for dry eyes).    . potassium chloride (KLOR-CON 10) 10 MEQ tablet Take 1 tablet (10 mEq total) by mouth daily. 90 tablet 1  . Tiotropium Bromide Monohydrate (SPIRIVA RESPIMAT) 2.5 MCG/ACT AERS Inhale 2 puffs into the lungs daily. 1 Inhaler 11  . traZODone (DESYREL) 50 MG tablet TAKE 1/2-1 TABLET BY MOUTH AT BEDTIME AS NEEDED FOR SLEEP 90 tablet 1  . triamcinolone cream (KENALOG) 0.1 % Apply 1 application topically 2 (two) times daily. Use once or twice a day for poison ivy- apply sparingly to face.  Use no longer than 1 week 30 g 0   No current facility-administered medications on file prior to visit.     Review of Systems:  As per HPI- otherwise negative.   Physical Examination: There were no vitals filed for this visit. There were no vitals filed for this visit. There is no height or weight on file to calculate BMI. Ideal Body Weight:    GEN: WDWN, NAD, Non-toxic, A & O x 3 HEENT: Atraumatic, Normocephalic. Neck supple. No masses, No LAD. Ears and Nose: No external deformity. CV: RRR, No M/G/R. No JVD. No thrill. No extra heart sounds. PULM: CTA B, no wheezes, crackles, rhonchi. No retractions. No resp. distress. No accessory muscle use. ABD: S, NT, ND, +BS. No rebound. No HSM. EXTR: No c/c/e NEURO Normal gait.  PSYCH: Normally interactive. Conversant. Not depressed or anxious appearing.  Calm demeanor.  Assessment and Plan: ***  Signed Lamar Blinks, MD

## 2019-04-05 ENCOUNTER — Ambulatory Visit: Payer: Medicare Other | Admitting: Family Medicine

## 2019-04-05 ENCOUNTER — Other Ambulatory Visit: Payer: Self-pay | Admitting: *Deleted

## 2019-04-05 NOTE — Patient Outreach (Signed)
[facility] [facility]) Care Management  04/05/2019  Destiny Ballard Sep 06, 1950 [id number]   [place]  Referral Date:12/25/2017 Referral Source:Transfer from [place] Reason for Referral:Continued Disease Management Education Insurance:Medicare   Outreach Attempt:  Outreach attempt #3 to patient for follow up.  Patient answered and stated she just had to put one of her dogs to sleep.  RN Health Coach offered condolences and patient requested telephone call back.   Plan:  RN Health Coach will make another outreach attempt within the month of October per patient's request.  Destiny Azure RN [place] [phone] [email or web address]

## 2019-04-07 ENCOUNTER — Other Ambulatory Visit: Payer: Self-pay | Admitting: Pharmacy Technician

## 2019-04-07 NOTE — Patient Outreach (Signed)
[facility]) Care Management  04/07/2019  Destiny Ballard Dec 02, 1950 [id number]    Care coordination call placed to [employer] in regards to patient's application for Proventil HFA.  Spoke to [place] who informed they had received the patient's application on [id number] and had mailed her the attestation form on that same day.  Will followup with patient concerning this information.  Letia Guidry P. Evora Schechter, [place] Management [phone]

## 2019-04-07 NOTE — Patient Outreach (Signed)
[facility]) Care Management  04/07/2019  AMBERA CONK July 31, 1950 [id number]  Successful outreach call placed to patient in regards to AZ&ME application for Farxiga, [employer] application for [employer], BI application for [employer] and Scientist, clinical (histocompatibility and immunogenetics) for [employer].  Spoke to patient, HIPAA identifiers verified.  Patient informed she had not been able to mail back the proof of income or the OOP spend. She informed she has the documents needed but has had a lot going on in her life with the passing of her dog as well as other things. She informed she would try and mail them back soon.   Informed patient that [employer] had received her application and in turn mailed her out another form to fill out. Informed patient it was mailed to her on 9/16. She informed she had not received it yet. Informed patient to call me when she receives it and we can go over it together. Patient verbalized understanding.  Will followup with patient in 5-10 business days to confirm receipt of attestation letter.  Cecilie Heidel P. Eugene Zeiders, [place] Management [phone]

## 2019-04-09 ENCOUNTER — Other Ambulatory Visit: Payer: Self-pay | Admitting: Family Medicine

## 2019-04-09 DIAGNOSIS — J302 Other seasonal allergic rhinitis: Secondary | ICD-10-CM

## 2019-04-12 ENCOUNTER — Ambulatory Visit: Payer: Self-pay | Admitting: Pharmacist

## 2019-04-16 ENCOUNTER — Other Ambulatory Visit: Payer: Self-pay | Admitting: *Deleted

## 2019-04-16 NOTE — Patient Outreach (Signed)
[place]) Care Management  04/16/2019  [name] 1950/11/28 [id number]   [place]  Referral Date:12/25/2017 Referral Source:Transfer from [place] Reason for Referral:Continued Disease Management Education Insurance:Medicare   Outreach Attempt:  Outreach attempt #4 to patient for follow up. No answer. RN Health Coach left HIPAA compliant voicemail message along with contact information.  Plan:  RN Health Coach will make another outreach attempt within the month of October.  [place] [phone] [name]

## 2019-04-21 ENCOUNTER — Other Ambulatory Visit: Payer: Self-pay | Admitting: Pharmacy Technician

## 2019-04-21 NOTE — Patient Outreach (Signed)
[facility]) Care Management  04/21/2019  POLLYANNE IACCARINO 1950-12-17 [id number]   ADDENDUM  Care coordination call placed to [employer] patient assistance in regards to patient's Proventil application.  Spoke to [employer] and informed her that patient had received a latter from [employer] but that it did not include an attestation form, return envelope or original application.  Per Myra the attestation form and original application were both mailed to patient in the same envelope on 03/31/2019. She informed that information would need to be sent back before application processing can continue and medication can be shipped out. If patient has misplaced these items, then Myra said the process will have to start again.  Will await a return call from patient as stated previously. If patient doesn't not return the call will followup with her in 5-10 business days.  Teresia Myint P. Temiloluwa Recchia, [place] Management [phone]

## 2019-04-21 NOTE — Patient Outreach (Signed)
[place] [facility]) Care Management  04/21/2019  Destiny Ballard 1950-07-16 [id number]  Successful outgoing call placed to patient in regards to [facility] application for Spiriva Respimat, AZ&ME application for [place] application for [employer] and Scientist, clinical (histocompatibility and immunogenetics) for [employer].  Spoke to patient, HIPAA identifiers verified.  Patient informed she received a letter from [employer] saying she was not approved due to having insurance. She informed she did not receive the attestation form, original application or a return envelope. Informed patient I would double check with [employer] but that the letter does make it seem as though one  may not be approved due to having Medicare Part D but that there should be an attestation form for her to fill out along with the other documents mentioned above. Patient informed she would double check when she gets home later today and would call me back before 5pm to let me know what she found out.   Will followup with [employer] patient assistance.  [name] P. Eulas Schweitzer, [place] Management [phone]

## 2019-04-22 ENCOUNTER — Other Ambulatory Visit: Payer: Self-pay | Admitting: Pharmacy Technician

## 2019-04-22 NOTE — Patient Outreach (Signed)
[place] [facility] [facility]) Care Management  04/22/2019  Destiny Ballard 02/14/1951 [id number]    ADDENDUM  Incoming voicemail received today from patient in regards to [employer] application for Proventil HFA.  In her voicemail message patient informed that she has been unable to find the attestation letter due to her "house being in chaos from cleaning it". Patient informed she would call me back if she found it.  Will followup with patient in 5-10 business days if call is not returned.  Jacquie Lukes P. [name], [place] Management [phone]

## 2019-04-22 NOTE — Patient Outreach (Signed)
[facility]) Care Management  04/22/2019  ALLISEN HATLESTAD [birth year] [id number]    Received information (but missing proof of income and an updated OOP) from patient and received all of providers information for the following patient assistance applications: AZ&ME-Farxiga BI-Spiriva Respimat Novo Nordisk-Levemir  Submitted applications as they were sent to me via fax. Several outreach attempts have been made to inquire if patient would mail back the appropriate items but no specific timeframe was given from patient to when the information would be mailed back.  Will followup with each company in 2-7 business days to inquire on status.  Rochelle Nephew P. Vinod Mikesell, [place] Management [phone]

## 2019-04-26 ENCOUNTER — Emergency Department (HOSPITAL_BASED_OUTPATIENT_CLINIC_OR_DEPARTMENT_OTHER)
Admission: EM | Admit: 2019-04-26 | Discharge: 2019-04-26 | Disposition: A | Discharge status: Home / Self Care | Payer: Medicare Other | Attending: Emergency Medicine | Admitting: Emergency Medicine

## 2019-04-26 ENCOUNTER — Emergency Department (HOSPITAL_BASED_OUTPATIENT_CLINIC_OR_DEPARTMENT_OTHER): Payer: Medicare Other

## 2019-04-26 ENCOUNTER — Encounter (HOSPITAL_BASED_OUTPATIENT_CLINIC_OR_DEPARTMENT_OTHER): Payer: Self-pay

## 2019-04-26 ENCOUNTER — Other Ambulatory Visit: Payer: Self-pay | Admitting: Pharmacy Technician

## 2019-04-26 ENCOUNTER — Other Ambulatory Visit: Payer: Self-pay

## 2019-04-26 DIAGNOSIS — Z6836 Body mass index (BMI) 36.0-36.9, adult: Secondary | ICD-10-CM | POA: Insufficient documentation

## 2019-04-26 DIAGNOSIS — Z794 Long term (current) use of insulin: Secondary | ICD-10-CM | POA: Diagnosis not present

## 2019-04-26 DIAGNOSIS — I5032 Chronic diastolic (congestive) heart failure: Secondary | ICD-10-CM | POA: Diagnosis not present

## 2019-04-26 DIAGNOSIS — Y92008 Other place in unspecified non-institutional (private) residence as the place of occurrence of the external cause: Secondary | ICD-10-CM | POA: Insufficient documentation

## 2019-04-26 DIAGNOSIS — Z7722 Contact with and (suspected) exposure to environmental tobacco smoke (acute) (chronic): Secondary | ICD-10-CM | POA: Diagnosis not present

## 2019-04-26 DIAGNOSIS — I110 Hypertensive heart disease with heart failure: Secondary | ICD-10-CM | POA: Diagnosis not present

## 2019-04-26 DIAGNOSIS — Z79899 Other long term (current) drug therapy: Secondary | ICD-10-CM | POA: Diagnosis not present

## 2019-04-26 DIAGNOSIS — J449 Chronic obstructive pulmonary disease, unspecified: Secondary | ICD-10-CM | POA: Diagnosis not present

## 2019-04-26 DIAGNOSIS — Z7982 Long term (current) use of aspirin: Secondary | ICD-10-CM | POA: Diagnosis not present

## 2019-04-26 DIAGNOSIS — W19XXXA Unspecified fall, initial encounter: Secondary | ICD-10-CM

## 2019-04-26 DIAGNOSIS — E669 Obesity, unspecified: Secondary | ICD-10-CM | POA: Insufficient documentation

## 2019-04-26 DIAGNOSIS — W010XXA Fall on same level from slipping, tripping and stumbling without subsequent striking against object, initial encounter: Secondary | ICD-10-CM | POA: Diagnosis not present

## 2019-04-26 DIAGNOSIS — E119 Type 2 diabetes mellitus without complications: Secondary | ICD-10-CM | POA: Diagnosis not present

## 2019-04-26 DIAGNOSIS — I2510 Atherosclerotic heart disease of native coronary artery without angina pectoris: Secondary | ICD-10-CM | POA: Diagnosis not present

## 2019-04-26 DIAGNOSIS — Z955 Presence of coronary angioplasty implant and graft: Secondary | ICD-10-CM | POA: Insufficient documentation

## 2019-04-26 DIAGNOSIS — Y9301 Activity, walking, marching and hiking: Secondary | ICD-10-CM | POA: Diagnosis not present

## 2019-04-26 DIAGNOSIS — R0781 Pleurodynia: Secondary | ICD-10-CM

## 2019-04-26 DIAGNOSIS — Y999 Unspecified external cause status: Secondary | ICD-10-CM | POA: Insufficient documentation

## 2019-04-26 DIAGNOSIS — S299XXA Unspecified injury of thorax, initial encounter: Secondary | ICD-10-CM | POA: Diagnosis not present

## 2019-04-26 MED ORDER — HYDROCODONE-ACETAMINOPHEN 5-325 MG PO TABS: 1.0000 | ORAL_TABLET | Freq: Four times a day (QID) | ORAL | 0 refills | Status: DC | PRN

## 2019-04-26 MED ORDER — LIDOCAINE 5 % EX PTCH: 1.0000 | MEDICATED_PATCH | CUTANEOUS | 0 refills | Status: DC

## 2019-04-26 MED ORDER — LIDOCAINE 5 % EX PTCH
1.0000 | MEDICATED_PATCH | Freq: Once | CUTANEOUS | Status: DC
  Filled 2019-04-26: qty 1

## 2019-04-26 MED FILL — LIDOCAINE PATCH 5%: 5 | 15 days supply | Qty: 30 | Fill #0

## 2019-04-26 MED FILL — HYDROCODON-APAP 5-325: 5-325 | 4 days supply | Qty: 15 | Fill #0

## 2019-04-26 NOTE — Patient Outreach (Signed)
[facility]) Care Management  04/26/2019  Destiny Ballard February 16, 1951 [id number]  Care coordination call placed to [place] in regards to patient's application for Levemir.  Spoke to [place] who informed application is on hold due to missing proof of income. She also was inquiring if patient has Medicare Part D as it was unclear on the application but then she informed she sees the attached insurance cards.  She informed the patient id is [id number].  Once information is received the application can continue to be processed.  Akisha Sturgill P. Correna Meacham, [place] Management [phone]

## 2019-04-26 NOTE — ED Triage Notes (Signed)
Pt slipped/fell on wet cement this am-pain to left UE and left rib area-NAD-steady gait

## 2019-04-26 NOTE — ED Notes (Signed)
Patient transported to X-ray 

## 2019-04-26 NOTE — ED Provider Notes (Signed)
[facility] EMERGENCY DEPARTMENT Provider Note   CSN: [id number] Arrival date & time: 04/26/19  1137     History   Chief Complaint Chief Complaint  Patient presents with  . Fall    HPI Destiny Ballard is a 68 y.o. female with PMHx HTN, HLD, Diabetes, GERD, Depression, Anxiety, CAD, OSA who presents to the ED today complaining of sudden onset, constant, sharp, left rib pain s/p mechanical fall that occurred 1 hour ago.  She reports she was walking back from her mailbox when she slipped on the curb causing her to fall and land on her left side.  No head injury or loss of consciousness.  She said she had immediate pain to her left ribs.  The pain is worsened with deep inspiration.  She has not taken anything for her symptoms prior to arrival.  She is not anticoagulated.  Denies pain to any other areas.        Past Medical History:  Diagnosis Date  . Anxiety    Prior suicide attempt  . CAD (coronary artery disease)    a) s/p DES to LAD 07/2005 b) Last Myoview low risk 11/2011 showing small fixed apical perfusion defect (prior MI vs attenuation) but no ischemia - normal EF.  [name] Cervical spondylosis   . Coronary atherosclerosis 06/28/2008  . Depression   . Depression with anxiety   . Diabetes mellitus without complication ([place])   . GERD (gastroesophageal reflux disease)   . Hyperlipidemia   . Hypertension   . Insulin resistance   . Iron deficiency anemia   . Obesity     Patient Active Problem List   Diagnosis Date Noted  . Obesity (BMI 30-39.9) 08/24/2018  . Depression with anxiety 08/24/2018  . CAD (coronary artery disease) 08/24/2018  . Abnormal cardiovascular stress test 08/24/2018  . Lobar pneumonia, unspecified organism ([place]) 11/13/2017  . Precordial chest pain   . Sepsis ([place]) 09/18/2017  . Chronic diastolic CHF (congestive heart failure) ([place]) 09/18/2017  . Left hip pain 10/28/2016  . Chronic rhinitis 08/07/2016  . COPD with acute exacerbation ([place])  04/25/2016  . Essential hypertension 01/30/2016  . Hypokalemia 01/27/2016  . HLD (hyperlipidemia)   . Type 2 diabetes mellitus without complication, without long-term current use of insulin ([place])   . OSA (obstructive sleep apnea) 05/06/2014  . Dyspnea on exertion 09/02/2012  . Chest pain 12/10/2011  . Chronic cough 05/11/2010  . GERD 06/28/2008    Past Surgical History:  Procedure Laterality Date  . BREAST ENHANCEMENT SURGERY    . CARDIAC CATHETERIZATION  06/17/2007   NORMAL. EF 60%  . CARDIAC CATHETERIZATION N/A 01/29/2016   Procedure: Left Heart Cath and Coronary Angiography;  Surgeon: Sherren Mocha, MD;  Location: [facility];  Service: Cardiovascular;  Laterality: N/A;  . CERVICAL SPONDYLOSIS     SINGLE LEVEL FUSION  . CHILDBIRTH     X3  . CORONARY STENT PLACEMENT  07/2005   LEFT ANTERIOR DESCENDING  . FOREARM FRACTURE SURGERY  2010   hand and shoulder   . INCISION AND DRAINAGE BREAST ABSCESS  01/05/2012      . INCISION AND DRAINAGE PERIRECTAL ABSCESS N/A 02/18/2014   Procedure: IRRIGATION AND DEBRIDEMENT PERIRECTAL ABSCESS;  Surgeon: Pedro Earls, MD;  Location: [facility];  Service: General;  Laterality: N/A;  . LEFT HEART CATH AND CORONARY ANGIOGRAPHY N/A 10/10/2017   Procedure: LEFT HEART CATH AND CORONARY ANGIOGRAPHY;  Surgeon: Burnell Blanks, MD;  Location: [facility];  Service: Cardiovascular;  Laterality: N/A;  . LUMBAR LAMINECTOMY    . ROTATOR CUFF REPAIR     bilaterla  . TONSILLECTOMY AND ADENOIDECTOMY    . TUBAL LIGATION    . VIDEO BRONCHOSCOPY Bilateral 08/13/2016   Procedure: VIDEO BRONCHOSCOPY WITHOUT FLUORO;  Surgeon: Collene Gobble, MD;  Location: [facility];  Service: Cardiopulmonary;  Laterality: Bilateral;     OB History   No obstetric history on file.      Home Medications    Prior to Admission medications   Medication Sig Start Date End Date Taking? Authorizing Provider  albuterol (PROAIR HFA) 108 (90 Base) MCG/ACT  inhaler Inhale 2 puffs into the lungs every 4 (four) hours as needed for wheezing or shortness of breath. 09/15/17   Collene Gobble, MD  aspirin EC 81 MG tablet Take 81 mg by mouth daily.    [provider]  benzonatate (TESSALON) 200 MG capsule TAKE 1 CAPSULE BY MOUTH 3 TIMES A DAY AS NEEDED FOR COUGH 12/15/18   Copland, Gay Filler, MD  calcium carbonate (TUMS - DOSED IN MG ELEMENTAL CALCIUM) 500 MG chewable tablet Chew 2 tablets by mouth daily as needed for indigestion or heartburn.    [provider]  cetirizine (ZYRTEC) 10 MG tablet TAKE 1 TABLET BY MOUTH DAILY AS NEEDED FOR ALLERGIES 12/16/17   Copland, Gay Filler, MD  Cholecalciferol (VITAMIN D3) 1000 UNITS CAPS Take 1,000 Units by mouth daily.     [provider]  clonazePAM (KLONOPIN) 0.5 MG tablet TAKE 0.5 TABLETS (0.25 MG TOTAL) BY MOUTH 2 TIMES DAILY AS NEEDED FOR ANXIETY 01/05/18   Copland, Gay Filler, MD  dapagliflozin propanediol (FARXIGA) 10 MG TABS tablet Take 10 mg by mouth daily. 08/03/18   Copland, Gay Filler, MD  FLUoxetine (PROZAC) 40 MG capsule Take 1 capsule (40 mg total) by mouth daily. 12/23/18   Copland, Gay Filler, MD  fluticasone (FLONASE) 50 MCG/ACT nasal spray SPRAY 2 SPRAYS INTO EACH NOSTRIL EVERY DAY 06/08/18   Collene Gobble, MD  furosemide (LASIX) 20 MG tablet Take 1 tablet (20 mg total) by mouth daily. 01/04/19   Burtis Junes, NP  gabapentin (NEURONTIN) 300 MG capsule TAKE 2 CAPSULES BY MOUTH TWICE A DAY 03/16/19   Copland, Gay Filler, MD  glipiZIDE (GLUCOTROL) 5 MG tablet Take 1 tablet (5 mg total) by mouth daily before breakfast. 01/06/19   Copland, Gay Filler, MD  hydrALAZINE (APRESOLINE) 25 MG tablet TAKE 2 TABLETS (50 MG TOTAL) BY MOUTH 3 (THREE) TIMES DAILY. 02/23/19   Burtis Junes, NP  HYDROcodone-acetaminophen (NORCO/VICODIN) 5-325 MG tablet Take 1 tablet by mouth every 6 (six) hours as needed for severe pain. 04/26/19   Dionta Larke, PA-C  hydrOXYzine (ATARAX/VISTARIL) 25 MG tablet TAKE  0.5-1 TABLETS (12.5-25 MG TOTAL) BY MOUTH EVERY 8 (EIGHT) HOURS AS NEEDED FOR ITCHING. 12/03/18   Copland, Gay Filler, MD  ibuprofen (ADVIL) 600 MG tablet Take 600 mg by mouth every 8 (eight) hours as needed. for pain 01/14/19   [provider]  Insulin Detemir (LEVEMIR FLEXTOUCH) 100 UNIT/ML Pen Inject 28 Units into the skin daily. 03/26/19   Copland, Gay Filler, MD  lidocaine (LIDODERM) 5 % Place 1 patch onto the skin daily. Remove & Discard patch within 12 hours or as directed by MD 04/26/19   Eustaquio Maize, PA-C  losartan (COZAAR) 100 MG tablet Take 1 tablet (100 mg total) by mouth daily. 09/10/17   Burtis Junes, NP  lovastatin (MEVACOR) 20 MG  tablet TAKE 1 TABLET BY MOUTH EVERYDAY AT BEDTIME 12/04/18   Copland, Gay Filler, MD  metFORMIN (GLUCOPHAGE-XR) 500 MG 24 hr tablet Take 2 tablets (1,000 mg total) by mouth 2 (two) times daily. 02/24/19   Copland, Gay Filler, MD  methocarbamol (ROBAXIN) 750 MG tablet TAKE 1 TABLET (750 MG TOTAL) BY MOUTH EVERY 12 (TWELVE) HOURS AS NEEDED FOR MUSCLE SPASMS. 03/17/19   Copland, Gay Filler, MD  metoprolol succinate (TOPROL-XL) 100 MG 24 hr tablet TAKE 1 TABLET BY MOUTH EVERY DAY 09/21/18   Copland, Gay Filler, MD  montelukast (SINGULAIR) 10 MG tablet TAKE 1 TABLET BY MOUTH EVERY DAY AS NEEDED FOR ALLERGIES 04/09/19   Copland, Gay Filler, MD  nitroGLYCERIN (NITROSTAT) 0.4 MG SL tablet Place 1 tablet (0.4 mg total) under the tongue every 5 (five) minutes as needed for chest pain. 09/01/17   Copland, Gay Filler, MD  omega-3 acid ethyl esters (LOVAZA) 1 g capsule TAKE 1 CAPSULE (1 G TOTAL) BY MOUTH 2 (TWO) TIMES DAILY. 05/14/18   Burtis Junes, NP  ondansetron (ZOFRAN ODT) 4 MG disintegrating tablet 4mg  ODT q4 hours prn nausea/vomit 08/23/18   Duffy Bruce, MD  polyethylene glycol (MIRALAX / GLYCOLAX) packet Take 17 g by mouth daily. 02/19/18   Khatri, Hina, PA-C  Polyvinyl Alcohol-Povidone (REFRESH OP) Place 2 drops into both eyes daily as needed (for dry eyes).     [provider]  potassium chloride (KLOR-CON 10) 10 MEQ tablet Take 1 tablet (10 mEq total) by mouth daily. 01/04/19   Burtis Junes, NP  Tiotropium Bromide Monohydrate (SPIRIVA RESPIMAT) 2.5 MCG/ACT AERS Inhale 2 puffs into the lungs daily. 02/25/18   Copland, Gay Filler, MD  traZODone (DESYREL) 50 MG tablet TAKE 1/2-1 TABLET BY MOUTH AT BEDTIME AS NEEDED FOR SLEEP 02/01/19   Copland, Gay Filler, MD  triamcinolone cream (KENALOG) 0.1 % Apply 1 application topically 2 (two) times daily. Use once or twice a day for poison ivy- apply sparingly to face.  Use no longer than 1 week 11/26/18   Copland, Gay Filler, MD    Family History Family History  Problem Relation Age of Onset  . Heart attack Mother   . Diabetes Mother   . Lung cancer Mother   . Asthma Mother   . Heart disease Mother   . Suicidality Father        "killed himself"  . Asthma Daughter        x 2  . Cancer Daughter        pre-cancerous polyp  . Diabetes Sister   . Cancer Sister   . Cervical cancer Daughter        cervical   . Allergies Other        all family--seasonal allergies    Social History Social History   Tobacco Use  . Smoking status: Passive Smoke Exposure - Never Smoker  . Smokeless tobacco: Never Used  Substance Use Topics  . Alcohol use: Yes    Comment: occ  . Drug use: No     Allergies   Prednisone   Review of Systems Review of Systems  Cardiovascular: Positive for chest pain (left rib pain).  Musculoskeletal: Positive for arthralgias.  Skin: Negative for wound.  Neurological: Negative for syncope and headaches.     Physical Exam Updated Vital Signs BP (!) 159/57 (BP Location: Right Arm)   Pulse 85   Temp 98.5 F (36.9 C) (Oral)   Resp 20   Ht 4\' 11"  (1.499 m)  Wt 82.6 kg   SpO2 95%   BMI 36.76 kg/m   Physical Exam Vitals signs and nursing note reviewed.  Constitutional:      Appearance: She is obese. She is not ill-appearing.  HENT:     Head: Normocephalic and  atraumatic.     Comments: No raccoon's sign or battle's sign Eyes:     Conjunctiva/sclera: Conjunctivae normal.  Cardiovascular:     Rate and Rhythm: Normal rate and regular rhythm.     Pulses: Normal pulses.  Pulmonary:     Effort: Pulmonary effort is normal.     Breath sounds: Normal breath sounds. No wheezing, rhonchi or rales.     Comments: No ecchymosis noted to ribs on left side. No crepitus or bony stepoffs. + TTP throughout left lateral ribs.  Chest:     Chest wall: Tenderness present.  Abdominal:     General: Abdomen is flat.     Tenderness: There is no abdominal tenderness. There is no guarding or rebound.  Musculoskeletal: Normal range of motion.     Comments: No C, T, or L midline spinal tenderness. No tenderness throughout back diffusely. No tenderness to upper or lower extremities. 2+ radial and DP pulses.   Skin:    General: Skin is warm and dry.     Coloration: Skin is not jaundiced.  Neurological:     Mental Status: She is alert.      ED Treatments / Results  Labs (all labs ordered are listed, but only abnormal results are displayed) Labs Reviewed - No data to display  EKG None  Radiology Dg Ribs Unilateral W/chest Left  Result Date: 04/26/2019 CLINICAL DATA:  Slipped and fell.  Rib pain. EXAM: LEFT RIBS AND CHEST - 3+ VIEW COMPARISON:  12/24/2018 FINDINGS: Heart is normal size. Increasing bibasilar opacities, right greater than left which could reflect atelectasis or infiltrates. No visible rib fracture or pneumothorax. Bilateral breast implants noted. IMPRESSION: No visible displaced rib fracture. Bibasilar atelectasis or infiltrates, right greater than left could reflect atelectasis or infiltrates. Electronically Signed   By: Rolm Baptise M.D.   On: 04/26/2019 12:21    Procedures Procedures (including critical care time)  Medications Ordered in ED Medications  lidocaine (LIDODERM) 5 % 1 patch (has no administration in time range)     Initial  Impression / Assessment and Plan / ED Course  I have reviewed the triage vital signs and the nursing notes.  Pertinent labs & imaging results that were available during my care of the patient were reviewed by me and considered in my medical decision making (see chart for details).    68 year old female presents status post mechanical fall earlier today.  Currently complaining of pain to her left lateral ribs which she states she landed on.  No head injury or loss of consciousness.  Patient is not anticoagulated.  Triage report patient was complaining of some pain to her left upper extremity as well.  She has no tenderness throughout the entire left arm.  Range of motion intact throughout.  Will obtain x-ray of ribs at this time and reevaluate.   Xray without signs of rib fracture although exam consistent with this today. Will treat as if she has a broken rib - lidoderm patch given in the ED and prescribed as well. Short course of narcotic pain medication prescribed; PDMP reviewed without issue. Incentive spirometer given for patient as well. Advised to follow up with PCP in 1-2 weeks for recheck/repeat CXR. She is in  agreement with plan at this time and stable for discharge home.   This note was prepared using Dragon voice recognition software and may include unintentional dictation errors due to the inherent limitations of voice recognition software.       Final Clinical Impressions(s) / ED Diagnoses   Final diagnoses:  Fall, initial encounter  Rib pain on left side    ED Discharge Orders         Ordered    lidocaine (LIDODERM) 5 %  Every 24 hours     04/26/19 1234    HYDROcodone-acetaminophen (NORCO/VICODIN) 5-325 MG tablet  Every 6 hours PRN     04/26/19 1234           Eustaquio Maize, PA-C 04/26/19 1238    Blanchie Dessert, MD 04/26/19 2154

## 2019-04-26 NOTE — Discharge Instructions (Addendum)
Your chest xray did not show any signs of a broken rib but your pain does appear to be consistent with a broken rib  Please use lidocaine patches for comfort  I have prescribed a short course of pain medication for you to take as well  Please follow up with your PCP for further evaluation and to get a repeat chest xray in 1-2 weeks  Use incentive spirometer every hour while awake to help expand your lungs to prevent any infection

## 2019-04-28 NOTE — Progress Notes (Addendum)
[place] at [facility], [place], [place] [address] [phone]  Date:  04/29/2019   Name:  Destiny Ballard   DOB:  [birth year]   MRN:  [id number]  PCP:  Darreld Mclean, MD    Chief Complaint: Possible Fracture (fell in driveway of home on tuesday, seen at ER, possibe fracture that needs xray again)   History of Present Illness:  Destiny Ballard is a 68 y.o. very pleasant female patient who presents with the following:  [name] has history of poorly controlled diabetes, CHF, CAD status post stent in 2007, COPD, sleep apnea, hypertension, hyperlipidemia Following up today from recent ER visit from 10/12  Destiny Ballard tripped and fell while walking to her mailbox, she fell onto her left side and had rib pain from where her side hit the ground. Her films were negative, but she is presumed to have a possible rib fracture given pain.  She was given Lidoderm patches and pain medication-hydrocodone.  She continues to have pain when she takes a deep breath or coughs She does not see a bruise on her skin She is sleeping in a recliner, laying flat is too painful She has an incentive spirometer which she is using No chest congestion or fever   She got her flu shot and shingles #1 so far   She continues to enjoy a rehabbing baby squirrels, and then returning them to the wild  Dg Ribs Unilateral W/chest Left  Result Date: 04/26/2019 CLINICAL DATA:  Slipped and fell.  Rib pain. EXAM: LEFT RIBS AND CHEST - 3+ VIEW COMPARISON:  12/24/2018 FINDINGS: Heart is normal size. Increasing bibasilar opacities, right greater than left which could reflect atelectasis or infiltrates. No visible rib fracture or pneumothorax. Bilateral breast implants noted. IMPRESSION: No visible displaced rib fracture. Bibasilar atelectasis or infiltrates, right greater than left could reflect atelectasis or infiltrates. Electronically Signed   By: Rolm Baptise M.D.   On: 04/26/2019 12:21   Last seen by myself in July of this year We added glipizide to her regimen earlier this year, and her blood sugar did seem to be improving Plan check A1c today   Lab Results  Component Value Date   HGBA1C 10.1 (H) 01/04/2019   Aspirin 81 Albuterol as needed Farxiga 10 Glipizide 5 Metformin 1000 twice daily Hydralazine 25 3 times daily Lasix Potassium 10 mEq daily Trazodone Losartan Toprol-XL Lovastatin Lovaza Gabapentin Insulin detemir  Eye exam Flu shot-per patient done already Patient Active Problem List   Diagnosis Date Noted  . Obesity (BMI 30-39.9) 08/24/2018  . Depression with anxiety 08/24/2018  . CAD (coronary artery disease) 08/24/2018  . Abnormal cardiovascular stress test 08/24/2018  . Lobar pneumonia, unspecified organism ([place]) 11/13/2017  . Precordial chest pain   . Sepsis ([place]) 09/18/2017  . Chronic diastolic CHF (congestive heart failure) ([place]) 09/18/2017  . Left hip pain 10/28/2016  . Chronic rhinitis 08/07/2016  . COPD with acute exacerbation ([place]) 04/25/2016  . Essential hypertension 01/30/2016  . Hypokalemia 01/27/2016  . HLD (hyperlipidemia)   . Type 2 diabetes mellitus without complication, without long-term current use of insulin ([place])   . OSA (obstructive sleep apnea) 05/06/2014  . Dyspnea on exertion 09/02/2012  . Chest pain 12/10/2011  . Chronic cough 05/11/2010  . GERD 06/28/2008    Past Medical History:  Diagnosis Date  . Anxiety    Prior suicide attempt  . CAD (coronary artery disease)  a) s/p DES to LAD 07/2005 b) Last Myoview low risk 11/2011 showing small fixed apical perfusion defect (prior MI vs attenuation) but no ischemia - normal EF.  [name] Cervical spondylosis   . Coronary atherosclerosis 06/28/2008  . Depression   . Depression with anxiety   . Diabetes mellitus without complication ([place])   . GERD (gastroesophageal reflux disease)   . Hyperlipidemia   . Hypertension   . Insulin  resistance   . Iron deficiency anemia   . Obesity     Past Surgical History:  Procedure Laterality Date  . BREAST ENHANCEMENT SURGERY    . CARDIAC CATHETERIZATION  06/17/2007   NORMAL. EF 60%  . CARDIAC CATHETERIZATION N/A 01/29/2016   Procedure: Left Heart Cath and Coronary Angiography;  Surgeon: Sherren Mocha, MD;  Location: [facility];  Service: Cardiovascular;  Laterality: N/A;  . CERVICAL SPONDYLOSIS     SINGLE LEVEL FUSION  . CHILDBIRTH     X3  . CORONARY STENT PLACEMENT  07/2005   LEFT ANTERIOR DESCENDING  . FOREARM FRACTURE SURGERY  2010   hand and shoulder   . INCISION AND DRAINAGE BREAST ABSCESS  01/05/2012      . INCISION AND DRAINAGE PERIRECTAL ABSCESS N/A 02/18/2014   Procedure: IRRIGATION AND DEBRIDEMENT PERIRECTAL ABSCESS;  Surgeon: Pedro Earls, MD;  Location: [facility];  Service: General;  Laterality: N/A;  . LEFT HEART CATH AND CORONARY ANGIOGRAPHY N/A 10/10/2017   Procedure: LEFT HEART CATH AND CORONARY ANGIOGRAPHY;  Surgeon: Burnell Blanks, MD;  Location: [facility];  Service: Cardiovascular;  Laterality: N/A;  . LUMBAR LAMINECTOMY    . ROTATOR CUFF REPAIR     bilaterla  . TONSILLECTOMY AND ADENOIDECTOMY    . TUBAL LIGATION    . VIDEO BRONCHOSCOPY Bilateral 08/13/2016   Procedure: VIDEO BRONCHOSCOPY WITHOUT FLUORO;  Surgeon: Collene Gobble, MD;  Location: [facility];  Service: Cardiopulmonary;  Laterality: Bilateral;    Social History   Tobacco Use  . Smoking status: Passive Smoke Exposure - Never Smoker  . Smokeless tobacco: Never Used  Substance Use Topics  . Alcohol use: Yes    Comment: occ  . Drug use: No    Family History  Problem Relation Age of Onset  . Heart attack Mother   . Diabetes Mother   . Lung cancer Mother   . Asthma Mother   . Heart disease Mother   . Suicidality Father        "killed himself"  . Asthma Daughter        x 2  . Cancer Daughter        pre-cancerous polyp  . Diabetes Sister   . Cancer  Sister   . Cervical cancer Daughter        cervical   . Allergies Other        all family--seasonal allergies    Allergies  Allergen Reactions  . Prednisone Other (See Comments)    REACTION: mood swings, nightmares. "Shot doesn't bother me, reaction is just with the pill" she states she has had the steroid injections before. From our records methylprednisone was given to her in 2013 without any complications.    Medication list has been reviewed and updated.  Current Outpatient Medications on File Prior to Visit  Medication Sig Dispense Refill  . albuterol (PROAIR HFA) 108 (90 Base) MCG/ACT inhaler Inhale 2 puffs into the lungs every 4 (four) hours as needed for wheezing or shortness of breath. 3 Inhaler 1  .  aspirin EC 81 MG tablet Take 81 mg by mouth daily.    . benzonatate (TESSALON) 200 MG capsule TAKE 1 CAPSULE BY MOUTH 3 TIMES A DAY AS NEEDED FOR COUGH 90 capsule 5  . calcium carbonate (TUMS - DOSED IN MG ELEMENTAL CALCIUM) 500 MG chewable tablet Chew 2 tablets by mouth daily as needed for indigestion or heartburn.    . cetirizine (ZYRTEC) 10 MG tablet TAKE 1 TABLET BY MOUTH DAILY AS NEEDED FOR ALLERGIES 90 tablet 1  . Cholecalciferol (VITAMIN D3) 1000 UNITS CAPS Take 1,000 Units by mouth daily.     . clonazePAM (KLONOPIN) 0.5 MG tablet TAKE 0.5 TABLETS (0.25 MG TOTAL) BY MOUTH 2 TIMES DAILY AS NEEDED FOR ANXIETY 20 tablet 1  . dapagliflozin propanediol (FARXIGA) 10 MG TABS tablet Take 10 mg by mouth daily. 90 tablet 3  . FLUoxetine (PROZAC) 40 MG capsule Take 1 capsule (40 mg total) by mouth daily. 90 capsule 3  . fluticasone (FLONASE) 50 MCG/ACT nasal spray SPRAY 2 SPRAYS INTO EACH NOSTRIL EVERY DAY 48 g 0  . furosemide (LASIX) 20 MG tablet Take 1 tablet (20 mg total) by mouth daily. 30 tablet 6  . gabapentin (NEURONTIN) 300 MG capsule TAKE 2 CAPSULES BY MOUTH TWICE A DAY 360 capsule 1  . glipiZIDE (GLUCOTROL) 5 MG tablet Take 1 tablet (5 mg total) by mouth daily before  breakfast. 30 tablet 5  . hydrALAZINE (APRESOLINE) 25 MG tablet TAKE 2 TABLETS (50 MG TOTAL) BY MOUTH 3 (THREE) TIMES DAILY. 540 tablet 2  . HYDROcodone-acetaminophen (NORCO/VICODIN) 5-325 MG tablet Take 1 tablet by mouth every 6 (six) hours as needed for severe pain. 15 tablet 0  . hydrOXYzine (ATARAX/VISTARIL) 25 MG tablet TAKE 0.5-1 TABLETS (12.5-25 MG TOTAL) BY MOUTH EVERY 8 (EIGHT) HOURS AS NEEDED FOR ITCHING. 270 tablet 1  . ibuprofen (ADVIL) 600 MG tablet Take 600 mg by mouth every 8 (eight) hours as needed. for pain    . Insulin Detemir (LEVEMIR FLEXTOUCH) 100 UNIT/ML Pen Inject 28 Units into the skin daily. 15 mL 3  . lidocaine (LIDODERM) 5 % Place 1 patch onto the skin daily. Remove & Discard patch within 12 hours or as directed by MD 30 patch 0  . losartan (COZAAR) 100 MG tablet Take 1 tablet (100 mg total) by mouth daily. 90 tablet 3  . lovastatin (MEVACOR) 20 MG tablet TAKE 1 TABLET BY MOUTH EVERYDAY AT BEDTIME 90 tablet 1  . metFORMIN (GLUCOPHAGE-XR) 500 MG 24 hr tablet Take 2 tablets (1,000 mg total) by mouth 2 (two) times daily. 360 tablet 1  . methocarbamol (ROBAXIN) 750 MG tablet TAKE 1 TABLET (750 MG TOTAL) BY MOUTH EVERY 12 (TWELVE) HOURS AS NEEDED FOR MUSCLE SPASMS. 60 tablet 3  . metoprolol succinate (TOPROL-XL) 100 MG 24 hr tablet TAKE 1 TABLET BY MOUTH EVERY DAY 90 tablet 3  . montelukast (SINGULAIR) 10 MG tablet TAKE 1 TABLET BY MOUTH EVERY DAY AS NEEDED FOR ALLERGIES 90 tablet 1  . nitroGLYCERIN (NITROSTAT) 0.4 MG SL tablet Place 1 tablet (0.4 mg total) under the tongue every 5 (five) minutes as needed for chest pain. 20 tablet 3  . omega-3 acid ethyl esters (LOVAZA) 1 g capsule TAKE 1 CAPSULE (1 G TOTAL) BY MOUTH 2 (TWO) TIMES DAILY. 180 capsule 1  . ondansetron (ZOFRAN ODT) 4 MG disintegrating tablet 4mg  ODT q4 hours prn nausea/vomit 20 tablet 0  . polyethylene glycol (MIRALAX / GLYCOLAX) packet Take 17 g by mouth daily. 14 each  0  . Polyvinyl Alcohol-Povidone (REFRESH  OP) Place 2 drops into both eyes daily as needed (for dry eyes).    . potassium chloride (KLOR-CON 10) 10 MEQ tablet Take 1 tablet (10 mEq total) by mouth daily. 90 tablet 1  . Tiotropium Bromide Monohydrate (SPIRIVA RESPIMAT) 2.5 MCG/ACT AERS Inhale 2 puffs into the lungs daily. 1 Inhaler 11  . traZODone (DESYREL) 50 MG tablet TAKE 1/2-1 TABLET BY MOUTH AT BEDTIME AS NEEDED FOR SLEEP 90 tablet 1  . triamcinolone cream (KENALOG) 0.1 % Apply 1 application topically 2 (two) times daily. Use once or twice a day for poison ivy- apply sparingly to face.  Use no longer than 1 week 30 g 0   No current facility-administered medications on file prior to visit.     Review of Systems:  As per HPI- otherwise negative. Her blood sugar is "not good"- was 153 this am   Physical Examination: Vitals:   04/29/19 1435  BP: 130/60  Pulse: 90  Resp: 20  Temp: 97.9 F (36.6 C)  SpO2: 93%   Vitals:   04/29/19 1435  Weight: 178 lb (80.7 kg)  Height: 4\' 11"  (1.499 m)   Body mass index is 35.95 kg/m. Ideal Body Weight: Weight in (lb) to have BMI = 25: 123.5  GEN: WDWN, NAD, Non-toxic, A & O x 3, obese, looks well HEENT: Atraumatic, Normocephalic. Neck supple. No masses, No LAD. Ears and Nose: No external deformity. CV: RRR, No M/G/R. No JVD. No thrill. No extra heart sounds. PULM: CTA B, no wheezes, crackles, rhonchi. No retractions. No resp. distress. No accessory muscle use. ABD: S, NT, ND. No rebound. No HSM. She is quite tender over the left ribs, lateral to the left breast.  No bruise, crepitus, redness is noted EXTR: No c/c/e NEURO Normal gait.  PSYCH: Normally interactive. Conversant. Not depressed or anxious appearing.  Calm demeanor.    Assessment and Plan: Hospital discharge follow-up - Plan: HYDROcodone-acetaminophen (NORCO/VICODIN) 5-325 MG tablet  Uncontrolled type 2 diabetes mellitus with complication, with long-term current use of insulin (HCC) - Plan: Comprehensive metabolic  panel, Hemoglobin A1c  Peripheral edema - Plan: CBC  Hyperlipidemia, unspecified hyperlipidemia type - Plan: Lipid panel  Essential hypertension, benign  Chronic diastolic CHF (congestive heart failure) (HCC)  Closed fracture of one rib of left side with routine healing, subsequent encounter - Plan: HYDROcodone-acetaminophen (NORCO/VICODIN) 5-325 MG tablet  Here today to follow-up from recent ER visit.  She fell at home, was seen in the ER and diagnosed with likely rib fracture.  Her films are negative, but her pain is consistent with a fracture.  She notes Lidoderm patches do not help.  She is using hydrocodone, I gave her a refill as her pain persists.  I asked her to use this as little as possible, but to control her pain well enough to allow her son to take deep breaths and keep her lungs open.  She will let me know if she is not improving over the next week or so, sooner if she is getting worse  Labs pending as above  Signed Lamar Blinks, MD  Addendum 10/19, received note from [facility] pharmacist as well as her labs as follows Called her to discuss  Persistent leukocytosis since February.  Will refer to hematology to make sure all is well Her A1c is slightly better.  We will increase glipizide to 5 BID-currently taking 5 mg daily We will try changing from lovastatin to a more powerful statin drug,  Crestor.  However if triglycerides not improve may need to add a second agent  [facility] pharmacist notes that she needs her medications changed in 90 days for cost savings- looked through and most are at 90 days already   She was still having pain and went to UC on 10/17- she got an x-ray as follows: IMPRESSION: 1. Suspected nondisplaced fracture involving the lateral aspect the left 7th rib without associated pleural effusion, pneumothorax or radiopaque foreign body. 2. Findings suggestive of airways disease / bronchitis.  She notes that her rib pain is not controlled with hydrocodone and  she is very uncomfortable.  She sounds miserable and states she is having a lot of pain Advised her that I will prescribe oxycodone, use this or hydrocodone, not both  04/29/2019  2   04/29/2019  Hydrocodone-Acetamin 5-325 MG  20.00  6 Je Cop   [id number]   [place] ([id number])   0  16.67 MME  Comm Ins   [place]  04/26/2019  4   04/26/2019  Hydrocodone-Acetamin 5-325 MG  15.00  4 Ma Ven   [id number]   Med ([id number])   0  18.75 MME  Comm Ins   [place]  01/12/2019  2   01/12/2019  Hydrocodone-Acetamin 5-325 MG  15.00  3 Ma Pfe   [id number]   [place] ([id number])   0  25.00 MME  Comm Ins   [place]  08/25/2018  2   08/23/2018  Hydrocodone-Acetamin 5-325 MG  20.00  3 Ca Isa   [id number]   [place] ([id number])   0  33.33 MME  Comm Ins   [employer]  08/23/2018  3   08/23/2018  Hydrocodone-Acetamin 5-325 MG  20.00  2 Ca Isa   [id number]   [place] ([id number])   0  50.00 MME  Comm Ins   [place]  08/03/2018  2   08/03/2018  Hydrocodone-Homatropine Soln  60.00  8 Je Cop   [id number]   Nor ([id number])   0  7.50 MME         Results for orders placed or performed in visit on 04/29/19  CBC  Result Value Ref Range   WBC 14.3 (H) 4.0 - 10.5 K/uL   RBC 5.24 (H) 3.87 - 5.11 Mil/uL   Platelets 322.0 150.0 - 400.0 K/uL   Hemoglobin 13.0 12.0 - 15.0 g/dL   HCT 40.8 36.0 - 46.0 %   MCV 77.9 (L) 78.0 - 100.0 fl   MCHC 31.8 30.0 - 36.0 g/dL   RDW 14.5 11.5 - 15.5 %  Comprehensive metabolic panel  Result Value Ref Range   Sodium 137 135 - 145 mEq/L   Potassium 4.1 3.5 - 5.1 mEq/L   Chloride 95 (L) 96 - 112 mEq/L   CO2 32 19 - 32 mEq/L   Glucose, Bld 185 (H) 70 - 99 mg/dL   BUN 17 6 - 23 mg/dL   Creatinine, Ser 0.93 0.40 - 1.20 mg/dL   Total Bilirubin 0.4 0.2 - 1.2 mg/dL   Alkaline Phosphatase 99 39 - 117 U/L   AST 14 0 - 37 U/L   ALT 16 0 - 35 U/L   Total Protein 7.3 6.0 - 8.3 g/dL   Albumin 4.3 3.5 - 5.2 g/dL   Calcium 9.8 8.4 - 10.5 mg/dL   GFR 59.61 (L) >60.00 mL/min  Hemoglobin A1c  Result Value Ref Range   Hgb A1c MFr Bld 9.9 (H) 4.6 - 6.5 %  Lipid panel  Result Value Ref Range    Cholesterol  191 0 - 200 mg/dL   Triglycerides (H) 0.0 - 149.0 mg/dL    470.0 Triglyceride is over 400; calculations on Lipids are invalid.   HDL 44.70 >39.00 mg/dL   Total CHOL/HDL Ratio 4   LDL cholesterol, direct  Result Value Ref Range   Direct LDL 76.0 mg/dL    Meds ordered this encounter  Medications  . HYDROcodone-acetaminophen (NORCO/VICODIN) 5-325 MG tablet    Sig: Take 1 tablet by mouth every 6 (six) hours as needed for severe pain.    Dispense:  20 tablet    Refill:  0  . glipiZIDE (GLUCOTROL) 5 MG tablet    Sig: Take 1 tablet (5 mg total) by mouth 2 (two) times daily before a meal.    Dispense:  180 tablet    Refill:  3  . oxyCODONE-acetaminophen (PERCOCET/ROXICET) 5-325 MG tablet    Sig: Take 1 tablet by mouth every 6 (six) hours as needed for severe pain.    Dispense:  30 tablet    Refill:  0  . rosuvastatin (CRESTOR) 20 MG tablet    Sig: Take 1 tablet (20 mg total) by mouth daily.    Dispense:  90 tablet    Refill:  3

## 2019-04-29 ENCOUNTER — Other Ambulatory Visit: Payer: Self-pay

## 2019-04-29 ENCOUNTER — Encounter: Payer: Self-pay | Admitting: Family Medicine

## 2019-04-29 ENCOUNTER — Ambulatory Visit (INDEPENDENT_AMBULATORY_CARE_PROVIDER_SITE_OTHER): Payer: Medicare Other | Admitting: Family Medicine

## 2019-04-29 VITALS — BP 130/60 | HR 90 | Temp 97.9°F | Resp 20 | Ht 59.0 in | Wt 178.0 lb

## 2019-04-29 DIAGNOSIS — I10 Essential (primary) hypertension: Secondary | ICD-10-CM

## 2019-04-29 DIAGNOSIS — E785 Hyperlipidemia, unspecified: Secondary | ICD-10-CM

## 2019-04-29 DIAGNOSIS — Z09 Encounter for follow-up examination after completed treatment for conditions other than malignant neoplasm: Secondary | ICD-10-CM

## 2019-04-29 DIAGNOSIS — Z794 Long term (current) use of insulin: Secondary | ICD-10-CM

## 2019-04-29 DIAGNOSIS — I5032 Chronic diastolic (congestive) heart failure: Secondary | ICD-10-CM | POA: Diagnosis not present

## 2019-04-29 DIAGNOSIS — D72829 Elevated white blood cell count, unspecified: Secondary | ICD-10-CM | POA: Diagnosis not present

## 2019-04-29 DIAGNOSIS — E118 Type 2 diabetes mellitus with unspecified complications: Secondary | ICD-10-CM | POA: Diagnosis not present

## 2019-04-29 DIAGNOSIS — S2232XD Fracture of one rib, left side, subsequent encounter for fracture with routine healing: Secondary | ICD-10-CM

## 2019-04-29 DIAGNOSIS — R609 Edema, unspecified: Secondary | ICD-10-CM | POA: Diagnosis not present

## 2019-04-29 DIAGNOSIS — I25118 Atherosclerotic heart disease of native coronary artery with other forms of angina pectoris: Secondary | ICD-10-CM

## 2019-04-29 DIAGNOSIS — E1165 Type 2 diabetes mellitus with hyperglycemia: Secondary | ICD-10-CM | POA: Diagnosis not present

## 2019-04-29 DIAGNOSIS — IMO0002 Reserved for concepts with insufficient information to code with codable children: Secondary | ICD-10-CM

## 2019-04-29 MED ORDER — HYDROCODONE-ACETAMINOPHEN 5-325 MG PO TABS: 1.0000 | ORAL_TABLET | Freq: Four times a day (QID) | ORAL | 0 refills | Status: DC | PRN

## 2019-04-29 NOTE — Patient Instructions (Signed)
It was good to see you today, but I am sorry that you fell and got hurt.  I refilled your pain medication for you to use as needed.  Use this as little as you can, but we do need to keep your pain under control so that he can take deep breaths.  Please focus on taking deep breaths, and use your incentive spirometer several times a day.  If you are getting worse, or if you are not improving over the next week or so please let me know.  I will be in touch with your labs ASAP

## 2019-04-30 ENCOUNTER — Other Ambulatory Visit: Payer: Self-pay | Admitting: Pharmacy Technician

## 2019-04-30 ENCOUNTER — Other Ambulatory Visit: Payer: Self-pay | Admitting: Pharmacist

## 2019-04-30 LAB — COMPREHENSIVE METABOLIC PANEL
ALT: 16 U/L (ref 0–35)
AST: 14 U/L (ref 0–37)
Albumin: 4.3 g/dL (ref 3.5–5.2)
Alkaline Phosphatase: 99 U/L (ref 39–117)
BUN: 17 mg/dL (ref 6–23)
CO2: 32 mEq/L (ref 19–32)
Calcium: 9.8 mg/dL (ref 8.4–10.5)
Chloride: 95 mEq/L — ABNORMAL LOW (ref 96–112)
Creatinine, Ser: 0.93 mg/dL (ref 0.40–1.20)
GFR: 59.61 mL/min — ABNORMAL LOW (ref >60.00)
Glucose, Bld: 185 mg/dL — ABNORMAL HIGH (ref 70–99)
Potassium: 4.1 mEq/L (ref 3.5–5.1)
Sodium: 137 mEq/L (ref 135–145)
Total Bilirubin: 0.4 mg/dL (ref 0.2–1.2)
Total Protein: 7.3 g/dL (ref 6.0–8.3)

## 2019-04-30 LAB — CBC
HCT: 40.8 % (ref 36.0–46.0)
Hemoglobin: 13 g/dL (ref 12.0–15.0)
MCHC: 31.8 g/dL (ref 30.0–36.0)
MCV: 77.9 fl — ABNORMAL LOW (ref 78.0–100.0)
Platelets: 322 10*3/uL (ref 150.0–400.0)
RBC: 5.24 Mil/uL — ABNORMAL HIGH (ref 3.87–5.11)
RDW: 14.5 % (ref 11.5–15.5)
WBC: 14.3 10*3/uL — ABNORMAL HIGH (ref 4.0–10.5)

## 2019-04-30 LAB — HEMOGLOBIN A1C: Hgb A1c MFr Bld: 9.9 % — ABNORMAL HIGH (ref 4.6–6.5)

## 2019-04-30 LAB — LIPID PANEL
Cholesterol: 191 mg/dL (ref 0–200)
HDL: 44.7 mg/dL (ref >39.00)
Total CHOL/HDL Ratio: 4
Triglycerides: 470 mg/dL — ABNORMAL HIGH (ref 0.0–149.0)

## 2019-04-30 LAB — LDL CHOLESTEROL, DIRECT: Direct LDL: 76 mg/dL

## 2019-04-30 NOTE — Patient Outreach (Signed)
[facility]) Care Management  04/30/2019  Destiny Ballard 08-01-1950 [id number]  Received message from [place] that patient does not want to provide her financial information to the patient assistance companies. Pharmacist also informed that patient's prescription insurance is a private insurance NOT Medicare Part D. Phoned each patient assistance company to update them as the applications were submitted indicating patient had Medicare D. The outcomes are below.  Spoke to [place] at [employer] in regards to patient's application for Levemir. Informed Marcie Bal that patient's prescription insurance was not Medicare Part D but was a private plan. Marcie Bal informed this would make patient ineligible to apply.  Spoke to [place] at [employer] in regards to patient's application for [employer]. Informed Clifton James that patient's prescription insurance was not Medicare D but was a Barrister's clerk. Carlos informed patient could still apply but would need to send in the attestation form to be approved. Patient informed previously she could not locate the attestation form and was not interested in re applying.  Spoke to [place] at [place] in regards to [place] application. Informed her that patient's prescription insurance was not Medicare D but was a private plan. She informed patient would still be eligible as she has Medicare AB and thus her prescription card though not Part D in name, it was in function. She informed patient has been APPROVED as of 04/28/2019-07/15/2019. She informed she would receive a 90 days supply delivered to her home within the next 7-10 business days.  Spoke to [place] at [facility] in regards to patient's application for Spiriva Respimat. Informed him that patient's prescription insurance was not Medicare D but was a private plan. He informed patient would still be eligible as she has Medicare AB and thus her prescription card though not [place] D in name , it was in function. He  informed patient has been APPROVED as of 04/23/2019-07/15/2019. He informed 3 months supply would be delivered to patient tomorrow.  Will outreach patient and update her with this information.  Rylie Limburg P. Edithe Dobbin, [facility] [phone]

## 2019-04-30 NOTE — Patient Outreach (Signed)
[facility]) Care Management  04/30/2019  Destiny Ballard 1951-05-11 [id number]   ADDENDUM  Successful outgoing call placed to patient in regards to [employer] application for Levemir, [employer] application for [employer],  AZ[employer]ME application for [employer] and BI application for Spiriva Respimat.  Spoke to patient, HIPAA identifiers verified.  Informed patient that due to her prescription insurance she was ineligible to apply for Levemir through [employer]. Also informed her that she could apply for [employer]ck but she would need to send in the attestation form. She informed she could not find it and was not interested in re applying.   Informed patient that she was approved to get the [place] thru AZ&ME and the Spiriva Respimat through BI. Informed patient she would receive the Spiriva on 10/17/202 per Destiny Ballard the representative at [facility]. Informed patient she would receive the [place] in 7-10 business days per Destiny Ballard the representative at [place].  Will route note to [facility] that patient assistance has been completed and will remove myself from care team.  Luiz Ochoa. Aysiah Jurado, [facility] [phone]

## 2019-04-30 NOTE — Patient Outreach (Signed)
[facility]) Care Management  04/30/2019  Destiny Ballard 13-Apr-1951 [id number]  Patient was called for follow up after ED visit and medication assistance. HIPAA identifiers were obtained.   Patient is a 69 year old female with multiple medical conditions including but not limited to:  CAD, CHF, COPD, depression with anxiety, hypertension, hypokalemia, and type 2 diabetes. Patient visited the ED earlier this week for complications secondary to a fall.    Patient reported still being sore today but feeling a little better.  She shared that she was not interested in sending the financial information.  Patient confirmed that she has Medicare A & B but that her prescriptions are covered through a commercial plan offered through [employer].  [employer]. Representative said the patient's copay structure is set up as follows:  Brand and generic medications for a 30 day supply--30% of cost of medication  Generic meds 90 day- $10 copay Brand meds 30 day -$40  According to the representative said the patient would have a $40 copay for Spiriva.    Since the patient's insurance is commercial, she was also signed up for a Spiriva Manufacturer coupon to get Spiriva for $0.    The coupon was emailed to the patient from the drug manufacturer website.  Spoke with Susy Frizzle, she said she called each patient assistance program we sent applications to on the patient's behalf. [employer] Nordisk said the patient is no longer eligible. Merck and BI said the patient is still eligible for their program.  As such, patient will receive Spiriva and [place] from [place]. [employer] is still waiting on the patient to send in her attestation form.  Plan: Call patient back in month to follow up and make sure she is getting her medications in 90 day supplies. Send note to PCP and request the patient's prescriptions be sent in for 90 day supplies.  Elayne Guerin, PharmD, [facility]  Pharmacist [phone]

## 2019-05-01 ENCOUNTER — Encounter (HOSPITAL_BASED_OUTPATIENT_CLINIC_OR_DEPARTMENT_OTHER): Payer: Self-pay

## 2019-05-01 ENCOUNTER — Emergency Department (HOSPITAL_BASED_OUTPATIENT_CLINIC_OR_DEPARTMENT_OTHER)
Admission: EM | Admit: 2019-05-01 | Discharge: 2019-05-01 | Disposition: A | Discharge status: Home / Self Care | Payer: Medicare Other | Attending: Emergency Medicine | Admitting: Emergency Medicine

## 2019-05-01 ENCOUNTER — Emergency Department (HOSPITAL_BASED_OUTPATIENT_CLINIC_OR_DEPARTMENT_OTHER): Payer: Medicare Other

## 2019-05-01 ENCOUNTER — Other Ambulatory Visit: Payer: Self-pay

## 2019-05-01 DIAGNOSIS — E119 Type 2 diabetes mellitus without complications: Secondary | ICD-10-CM | POA: Insufficient documentation

## 2019-05-01 DIAGNOSIS — I2510 Atherosclerotic heart disease of native coronary artery without angina pectoris: Secondary | ICD-10-CM | POA: Insufficient documentation

## 2019-05-01 DIAGNOSIS — I110 Hypertensive heart disease with heart failure: Secondary | ICD-10-CM | POA: Insufficient documentation

## 2019-05-01 DIAGNOSIS — E785 Hyperlipidemia, unspecified: Secondary | ICD-10-CM | POA: Diagnosis not present

## 2019-05-01 DIAGNOSIS — Z888 Allergy status to other drugs, medicaments and biological substances status: Secondary | ICD-10-CM | POA: Insufficient documentation

## 2019-05-01 DIAGNOSIS — S2232XG Fracture of one rib, left side, subsequent encounter for fracture with delayed healing: Secondary | ICD-10-CM | POA: Diagnosis not present

## 2019-05-01 DIAGNOSIS — Z7982 Long term (current) use of aspirin: Secondary | ICD-10-CM | POA: Insufficient documentation

## 2019-05-01 DIAGNOSIS — Z794 Long term (current) use of insulin: Secondary | ICD-10-CM | POA: Diagnosis not present

## 2019-05-01 DIAGNOSIS — R0789 Other chest pain: Secondary | ICD-10-CM | POA: Diagnosis present

## 2019-05-01 DIAGNOSIS — S299XXA Unspecified injury of thorax, initial encounter: Secondary | ICD-10-CM | POA: Diagnosis not present

## 2019-05-01 DIAGNOSIS — Z79899 Other long term (current) drug therapy: Secondary | ICD-10-CM | POA: Insufficient documentation

## 2019-05-01 DIAGNOSIS — R0781 Pleurodynia: Secondary | ICD-10-CM | POA: Diagnosis not present

## 2019-05-01 DIAGNOSIS — I5032 Chronic diastolic (congestive) heart failure: Secondary | ICD-10-CM | POA: Diagnosis not present

## 2019-05-01 DIAGNOSIS — I10 Essential (primary) hypertension: Secondary | ICD-10-CM | POA: Diagnosis not present

## 2019-05-01 DIAGNOSIS — Z9861 Coronary angioplasty status: Secondary | ICD-10-CM | POA: Diagnosis not present

## 2019-05-01 DIAGNOSIS — W19XXXA Unspecified fall, initial encounter: Secondary | ICD-10-CM | POA: Diagnosis not present

## 2019-05-01 DIAGNOSIS — R52 Pain, unspecified: Secondary | ICD-10-CM | POA: Diagnosis not present

## 2019-05-01 DIAGNOSIS — W19XXXD Unspecified fall, subsequent encounter: Secondary | ICD-10-CM | POA: Diagnosis not present

## 2019-05-01 MED ORDER — HYDROCODONE-ACETAMINOPHEN 5-325 MG PO TABS
2.0000 | ORAL_TABLET | Freq: Once | ORAL | Status: AC
  Administered 2019-05-01: 2 via ORAL
  Filled 2019-05-01: qty 2

## 2019-05-01 NOTE — ED Provider Notes (Signed)
[facility] EMERGENCY DEPARTMENT Provider Note   CSN: [id number] Arrival date & time: 05/01/19  [id number]     History   Chief Complaint Chief Complaint  Patient presents with  . Rib Injury    HPI Destiny Ballard is a 68 y.o. female.  She has a history of diabetes hypertension hyperlipidemia coronary artery disease.  She said she had a mechanical fall 5 days ago in which she struck the left side of her chest.  She was seen here and had normal chest x-ray and treated with pain medications.  She said the medications of the making her tired but are helping a little bit with the pain.  She said this morning when she was getting up out of bed she heard a pop and had worsening pain on that side.  Not short of breath but hurts to take a breath.  No abdominal pain numbness weakness fevers or chills.     The history is provided by the patient.  Chest Pain Pain location:  L lateral chest Pain quality: sharp and stabbing   Pain radiates to:  L shoulder and upper back Pain severity:  Severe Onset quality:  Sudden Timing:  Constant Progression:  Worsening Chronicity:  New Context: movement   Relieved by:  Nothing Worsened by:  Coughing, deep breathing and movement Ineffective treatments: hydrocodone. Associated symptoms: back pain   Associated symptoms: no abdominal pain, no cough, no fever, no headache, no nausea, no shortness of breath and no vomiting   Risk factors: coronary artery disease, diabetes mellitus, high cholesterol and hypertension     Past Medical History:  Diagnosis Date  . Anxiety    Prior suicide attempt  . CAD (coronary artery disease)    a) s/p DES to LAD 07/2005 b) Last Myoview low risk 11/2011 showing small fixed apical perfusion defect (prior MI vs attenuation) but no ischemia - normal EF.  [name] Cervical spondylosis   . Coronary atherosclerosis 06/28/2008  . Depression   . Depression with anxiety   . Diabetes mellitus without complication ([place])   . GERD  (gastroesophageal reflux disease)   . Hyperlipidemia   . Hypertension   . Insulin resistance   . Iron deficiency anemia   . Obesity     Patient Active Problem List   Diagnosis Date Noted  . Obesity (BMI 30-39.9) 08/24/2018  . Depression with anxiety 08/24/2018  . CAD (coronary artery disease) 08/24/2018  . Abnormal cardiovascular stress test 08/24/2018  . Lobar pneumonia, unspecified organism ([place]) 11/13/2017  . Precordial chest pain   . Sepsis ([place]) 09/18/2017  . Chronic diastolic CHF (congestive heart failure) ([place]) 09/18/2017  . Left hip pain 10/28/2016  . Chronic rhinitis 08/07/2016  . COPD with acute exacerbation ([place]) 04/25/2016  . Essential hypertension 01/30/2016  . Hypokalemia 01/27/2016  . HLD (hyperlipidemia)   . Type 2 diabetes mellitus without complication, without long-term current use of insulin ([place])   . OSA (obstructive sleep apnea) 05/06/2014  . Dyspnea on exertion 09/02/2012  . Chest pain 12/10/2011  . Chronic cough 05/11/2010  . GERD 06/28/2008    Past Surgical History:  Procedure Laterality Date  . BREAST ENHANCEMENT SURGERY    . CARDIAC CATHETERIZATION  06/17/2007   NORMAL. EF 60%  . CARDIAC CATHETERIZATION N/A 01/29/2016   Procedure: Left Heart Cath and Coronary Angiography;  Surgeon: Sherren Mocha, MD;  Location: [facility];  Service: Cardiovascular;  Laterality: N/A;  . CERVICAL SPONDYLOSIS     SINGLE LEVEL FUSION  .  CHILDBIRTH     X3  . CORONARY STENT PLACEMENT  07/2005   LEFT ANTERIOR DESCENDING  . FOREARM FRACTURE SURGERY  2010   hand and shoulder   . INCISION AND DRAINAGE BREAST ABSCESS  01/05/2012      . INCISION AND DRAINAGE PERIRECTAL ABSCESS N/A 02/18/2014   Procedure: IRRIGATION AND DEBRIDEMENT PERIRECTAL ABSCESS;  Surgeon: Pedro Earls, MD;  Location: [facility];  Service: General;  Laterality: N/A;  . LEFT HEART CATH AND CORONARY ANGIOGRAPHY N/A 10/10/2017   Procedure: LEFT HEART CATH AND CORONARY ANGIOGRAPHY;  Surgeon:  Burnell Blanks, MD;  Location: [facility];  Service: Cardiovascular;  Laterality: N/A;  . LUMBAR LAMINECTOMY    . ROTATOR CUFF REPAIR     bilaterla  . TONSILLECTOMY AND ADENOIDECTOMY    . TUBAL LIGATION    . VIDEO BRONCHOSCOPY Bilateral 08/13/2016   Procedure: VIDEO BRONCHOSCOPY WITHOUT FLUORO;  Surgeon: Collene Gobble, MD;  Location: [facility];  Service: Cardiopulmonary;  Laterality: Bilateral;     OB History   No obstetric history on file.      Home Medications    Prior to Admission medications   Medication Sig Start Date End Date Taking? Authorizing Provider  albuterol (PROAIR HFA) 108 (90 Base) MCG/ACT inhaler Inhale 2 puffs into the lungs every 4 (four) hours as needed for wheezing or shortness of breath. 09/15/17   Collene Gobble, MD  aspirin EC 81 MG tablet Take 81 mg by mouth daily.    [provider]  benzonatate (TESSALON) 200 MG capsule TAKE 1 CAPSULE BY MOUTH 3 TIMES A DAY AS NEEDED FOR COUGH 12/15/18   Copland, Gay Filler, MD  calcium carbonate (TUMS - DOSED IN MG ELEMENTAL CALCIUM) 500 MG chewable tablet Chew 2 tablets by mouth daily as needed for indigestion or heartburn.    [provider]  cetirizine (ZYRTEC) 10 MG tablet TAKE 1 TABLET BY MOUTH DAILY AS NEEDED FOR ALLERGIES 12/16/17   Copland, Gay Filler, MD  Cholecalciferol (VITAMIN D3) 1000 UNITS CAPS Take 1,000 Units by mouth daily.     [provider]  clonazePAM (KLONOPIN) 0.5 MG tablet TAKE 0.5 TABLETS (0.25 MG TOTAL) BY MOUTH 2 TIMES DAILY AS NEEDED FOR ANXIETY 01/05/18   Copland, Gay Filler, MD  dapagliflozin propanediol (FARXIGA) 10 MG TABS tablet Take 10 mg by mouth daily. 08/03/18   Copland, Gay Filler, MD  FLUoxetine (PROZAC) 40 MG capsule Take 1 capsule (40 mg total) by mouth daily. 12/23/18   Copland, Gay Filler, MD  fluticasone (FLONASE) 50 MCG/ACT nasal spray SPRAY 2 SPRAYS INTO EACH NOSTRIL EVERY DAY 06/08/18   Collene Gobble, MD  furosemide (LASIX) 20 MG tablet Take 1  tablet (20 mg total) by mouth daily. 01/04/19   Burtis Junes, NP  gabapentin (NEURONTIN) 300 MG capsule TAKE 2 CAPSULES BY MOUTH TWICE A DAY 03/16/19   Copland, Gay Filler, MD  glipiZIDE (GLUCOTROL) 5 MG tablet Take 1 tablet (5 mg total) by mouth daily before breakfast. 01/06/19   Copland, Gay Filler, MD  hydrALAZINE (APRESOLINE) 25 MG tablet TAKE 2 TABLETS (50 MG TOTAL) BY MOUTH 3 (THREE) TIMES DAILY. 02/23/19   Burtis Junes, NP  HYDROcodone-acetaminophen (NORCO/VICODIN) 5-325 MG tablet Take 1 tablet by mouth every 6 (six) hours as needed for severe pain. 04/29/19   Copland, Gay Filler, MD  hydrOXYzine (ATARAX/VISTARIL) 25 MG tablet TAKE 0.5-1 TABLETS (12.5-25 MG TOTAL) BY MOUTH EVERY 8 (EIGHT) HOURS AS NEEDED FOR ITCHING. 12/03/18  Copland, Gay Filler, MD  ibuprofen (ADVIL) 600 MG tablet Take 600 mg by mouth every 8 (eight) hours as needed. for pain 01/14/19   [provider]  Insulin Detemir (LEVEMIR FLEXTOUCH) 100 UNIT/ML Pen Inject 28 Units into the skin daily. 03/26/19   Copland, Gay Filler, MD  lidocaine (LIDODERM) 5 % Place 1 patch onto the skin daily. Remove & Discard patch within 12 hours or as directed by MD 04/26/19   Eustaquio Maize, PA-C  losartan (COZAAR) 100 MG tablet Take 1 tablet (100 mg total) by mouth daily. 09/10/17   Burtis Junes, NP  lovastatin (MEVACOR) 20 MG tablet TAKE 1 TABLET BY MOUTH EVERYDAY AT BEDTIME 12/04/18   Copland, Gay Filler, MD  metFORMIN (GLUCOPHAGE-XR) 500 MG 24 hr tablet Take 2 tablets (1,000 mg total) by mouth 2 (two) times daily. 02/24/19   Copland, Gay Filler, MD  methocarbamol (ROBAXIN) 750 MG tablet TAKE 1 TABLET (750 MG TOTAL) BY MOUTH EVERY 12 (TWELVE) HOURS AS NEEDED FOR MUSCLE SPASMS. 03/17/19   Copland, Gay Filler, MD  metoprolol succinate (TOPROL-XL) 100 MG 24 hr tablet TAKE 1 TABLET BY MOUTH EVERY DAY 09/21/18   Copland, Gay Filler, MD  montelukast (SINGULAIR) 10 MG tablet TAKE 1 TABLET BY MOUTH EVERY DAY AS NEEDED FOR ALLERGIES 04/09/19   Copland,  Gay Filler, MD  nitroGLYCERIN (NITROSTAT) 0.4 MG SL tablet Place 1 tablet (0.4 mg total) under the tongue every 5 (five) minutes as needed for chest pain. 09/01/17   Copland, Gay Filler, MD  omega-3 acid ethyl esters (LOVAZA) 1 g capsule TAKE 1 CAPSULE (1 G TOTAL) BY MOUTH 2 (TWO) TIMES DAILY. 05/14/18   Burtis Junes, NP  ondansetron (ZOFRAN ODT) 4 MG disintegrating tablet 4mg  ODT q4 hours prn nausea/vomit 08/23/18   Duffy Bruce, MD  polyethylene glycol (MIRALAX / GLYCOLAX) packet Take 17 g by mouth daily. 02/19/18   Khatri, Hina, PA-C  Polyvinyl Alcohol-Povidone (REFRESH OP) Place 2 drops into both eyes daily as needed (for dry eyes).    [provider]  potassium chloride (KLOR-CON 10) 10 MEQ tablet Take 1 tablet (10 mEq total) by mouth daily. 01/04/19   Burtis Junes, NP  Tiotropium Bromide Monohydrate (SPIRIVA RESPIMAT) 2.5 MCG/ACT AERS Inhale 2 puffs into the lungs daily. 02/25/18   Copland, Gay Filler, MD  traZODone (DESYREL) 50 MG tablet TAKE 1/2-1 TABLET BY MOUTH AT BEDTIME AS NEEDED FOR SLEEP 02/01/19   Copland, Gay Filler, MD  triamcinolone cream (KENALOG) 0.1 % Apply 1 application topically 2 (two) times daily. Use once or twice a day for poison ivy- apply sparingly to face.  Use no longer than 1 week 11/26/18   Copland, Gay Filler, MD    Family History Family History  Problem Relation Age of Onset  . Heart attack Mother   . Diabetes Mother   . Lung cancer Mother   . Asthma Mother   . Heart disease Mother   . Suicidality Father        "killed himself"  . Asthma Daughter        x 2  . Cancer Daughter        pre-cancerous polyp  . Diabetes Sister   . Cancer Sister   . Cervical cancer Daughter        cervical   . Allergies Other        all family--seasonal allergies    Social History Social History   Tobacco Use  . Smoking status: Passive Smoke Exposure -  Never Smoker  . Smokeless tobacco: Never Used  Substance Use Topics  . Alcohol use: Yes    Comment: occ  .  Drug use: No     Allergies   Prednisone   Review of Systems Review of Systems  Constitutional: Negative for fever.  HENT: Negative for sore throat.   Eyes: Negative for visual disturbance.  Respiratory: Negative for cough and shortness of breath.   Cardiovascular: Positive for chest pain.  Gastrointestinal: Negative for abdominal pain, nausea and vomiting.  Genitourinary: Negative for dysuria.  Musculoskeletal: Positive for back pain.  Skin: Negative for rash.  Neurological: Negative for headaches.     Physical Exam Updated Vital Signs BP (!) 158/72 (BP Location: Right Arm)   Pulse 82   Temp 97.8 F (36.6 C) (Oral)   Resp 20   Ht 4\' 11"  (1.499 m)   Wt 80.7 kg   SpO2 93% Comment: Simultaneous filing. User may not have seen previous data.  BMI 35.95 kg/m   Physical Exam Vitals signs and nursing note reviewed.  Constitutional:      General: She is not in acute distress.    Appearance: She is well-developed.  HENT:     Head: Normocephalic and atraumatic.  Eyes:     Conjunctiva/sclera: Conjunctivae normal.  Neck:     Musculoskeletal: Neck supple.  Cardiovascular:     Rate and Rhythm: Normal rate and regular rhythm.     Heart sounds: No murmur.  Pulmonary:     Effort: Pulmonary effort is normal. No respiratory distress.     Breath sounds: Normal breath sounds.  Chest:    Abdominal:     Palpations: Abdomen is soft.     Tenderness: There is no abdominal tenderness.  Musculoskeletal: Normal range of motion.     Right lower leg: No edema.     Left lower leg: No edema.  Skin:    General: Skin is warm and dry.     Capillary Refill: Capillary refill takes less than 2 seconds.  Neurological:     General: No focal deficit present.     Mental Status: She is alert.     Sensory: No sensory deficit.     Motor: No weakness.      ED Treatments / Results  Labs (all labs ordered are listed, but only abnormal results are displayed) Labs Reviewed - No data to  display  EKG None  Radiology Dg Ribs Unilateral W/chest Left  Result Date: 05/01/2019 CLINICAL DATA:  Patient fell on 10/12 with persistent left-sided rib pain. EXAM: LEFT RIBS AND CHEST - 3+ VIEW COMPARISON:  Left rib radiographic series-04/26/2019; chest CT-12/24/2018 FINDINGS: Examination is degraded due to patient body habitus. Grossly unchanged cardiac silhouette and mediastinal contours. There is unchanged mild diffuse slightly nodular thickening of the pulmonary interstitium. Unchanged bibasilar heterogeneous opacities favored to represent atelectasis. No new focal airspace opacities. No pleural effusion or pneumothorax. No evidence of edema. There is a potential nondisplaced fracture involving the lateral aspect of the left seventh rib. Post lower cervical ACDF, incompletely evaluated. Presumed postoperative change involving the distal aspect of the left clavicle. Post bilateral breast augmentation. Regional soft tissues appear otherwise normal. No radiopaque foreign body. IMPRESSION: 1. Suspected nondisplaced fracture involving the lateral aspect the left 7th rib without associated pleural effusion, pneumothorax or radiopaque foreign body. 2. Findings suggestive of airways disease / bronchitis. Electronically Signed   By: Sandi Mariscal M.D.   On: 05/01/2019 08:33    Procedures Procedures (including critical  care time)  Medications Ordered in ED Medications - No data to display   Initial Impression / Assessment and Plan / ED Course  I have reviewed the triage vital signs and the nursing notes.  Pertinent labs & imaging results that were available during my care of the patient were reviewed by me and considered in my medical decision making (see chart for details).  Clinical Course as of Apr 30 930  Sat Apr 30, 6964  1945 68 year old female status post mechanical fall 5 days ago here with increased pain after waking up this morning and feeling a pop in her side.  Equal breath sounds  bilaterally.  Sats 93%.  Differential includes pneumothorax, hemothorax, rib fracture, contusion   [MB]  [id number] Chest x-ray reviewed by me, no pneumothorax identified.  Awaiting radiology reading.   [MB]    Clinical Course User Index [MB] Hayden Rasmussen, MD         Final Clinical Impressions(s) / ED Diagnoses   Final diagnoses:  Closed fracture of one rib of left side with delayed healing, subsequent encounter    ED Discharge Orders    None       Hayden Rasmussen, MD 05/01/19 [phone]

## 2019-05-01 NOTE — ED Notes (Signed)
ED Provider at bedside. 

## 2019-05-01 NOTE — ED Notes (Signed)
Patient transported to X-ray 

## 2019-05-01 NOTE — Discharge Instructions (Addendum)
You were seen in the emergency department for worsening left-sided chest pain after a fall 5 days ago.  You had x-rays that showed a probable left seventh rib fracture.  These are very painful and can take multiple weeks to heal.  Please continue your pain medicine as directed by your primary care doctor.  Return to the emergency department if any worsening of your symptoms.

## 2019-05-01 NOTE — ED Triage Notes (Signed)
Pt arrived via [facility] from home with c/o left rib pain after a fall on the 12th which pt was seen here for. Pt has been taking hydrocodone at home with continued pain and was seen by PCP yesterday and given lidocaine patches. Pt reports hearing a pop today when getting up. Pt reports last pain medication taken was last night around 7 or 8.

## 2019-05-03 ENCOUNTER — Encounter: Payer: Self-pay | Admitting: Family Medicine

## 2019-05-03 MED ORDER — OXYCODONE-ACETAMINOPHEN 5-325 MG PO TABS: 1.0000 | ORAL_TABLET | Freq: Four times a day (QID) | ORAL | 0 refills | Status: DC | PRN

## 2019-05-03 MED ORDER — GLIPIZIDE 5 MG PO TABS: 5.0000 mg | ORAL_TABLET | Freq: Two times a day (BID) | ORAL | 3 refills | Status: DC

## 2019-05-03 MED ORDER — ROSUVASTATIN CALCIUM 20 MG PO TABS: 20.0000 mg | ORAL_TABLET | Freq: Every day | ORAL | 3 refills | Status: DC

## 2019-05-03 NOTE — Addendum Note (Signed)
Addended by: Lamar Blinks C on: 05/03/2019 12:36 PM   Modules accepted: Orders

## 2019-05-04 ENCOUNTER — Telehealth: Payer: Self-pay | Admitting: Family

## 2019-05-04 NOTE — Telephone Encounter (Signed)
Spoke with patient to confirm new patient appt 11/3 at 1030 am . Patient aware of appt date/time/location

## 2019-05-07 ENCOUNTER — Other Ambulatory Visit: Payer: Self-pay | Admitting: *Deleted

## 2019-05-07 ENCOUNTER — Encounter: Payer: Self-pay | Admitting: *Deleted

## 2019-05-07 NOTE — Patient Outreach (Signed)
[facility]) Care Management  [facility] Care Manager  05/07/2019   Destiny Ballard [birth year] [id number]   [facility] Quarterly Outreach   Referral Date:  12/25/2017 Referral Source:  Transfer from [facility] Reason for Referral:  Continued Disease Management Education Insurance:  Medicare    Outreach Attempt:  Successful telephone outreach to patient for follow up.  HIPAA verified with patient.  Patient reporting recent fall at home slipping on the wet pavement and now having left rib fracture.  Pain worse with activity but is managed well with oral pain mediation.  Fall precautions and preventions reviewed and discussed.  Patient reporting she has not been checking her blood sugars for the past 4-5 days.  Offers to check while on the line but just finished eating, so glucose was not checked.  Does report previous weeks blood sugars have ranged in the 170's.  Last Hgb A1C was 9.9.  Reviewed chart with patient and discussed/reviewed medication changes made by Dr. Lorelei Pont.  Patient stated she understood the changes.  Denies any shortness of breath or lower extremity edema.  Encounter Medications:  Outpatient Encounter Medications as of 05/07/2019  Medication Sig  . albuterol (PROAIR HFA) 108 (90 Base) MCG/ACT inhaler Inhale 2 puffs into the lungs every 4 (four) hours as needed for wheezing or shortness of breath.  [name] aspirin EC 81 MG tablet Take 81 mg by mouth daily.  . benzonatate (TESSALON) 200 MG capsule TAKE 1 CAPSULE BY MOUTH 3 TIMES A DAY AS NEEDED FOR COUGH  . calcium carbonate (TUMS - DOSED IN MG ELEMENTAL CALCIUM) 500 MG chewable tablet Chew 2 tablets by mouth daily as needed for indigestion or heartburn.  . cetirizine (ZYRTEC) 10 MG tablet TAKE 1 TABLET BY MOUTH DAILY AS NEEDED FOR ALLERGIES  . Cholecalciferol (VITAMIN D3) 1000 UNITS CAPS Take 1,000 Units by mouth daily.   . clonazePAM (KLONOPIN) 0.5 MG tablet TAKE 0.5 TABLETS (0.25 MG TOTAL) BY MOUTH 2  TIMES DAILY AS NEEDED FOR ANXIETY  . dapagliflozin propanediol (FARXIGA) 10 MG TABS tablet Take 10 mg by mouth daily.  [name] FLUoxetine (PROZAC) 40 MG capsule Take 1 capsule (40 mg total) by mouth daily.  . fluticasone (FLONASE) 50 MCG/ACT nasal spray SPRAY 2 SPRAYS INTO EACH NOSTRIL EVERY DAY  . furosemide (LASIX) 20 MG tablet Take 1 tablet (20 mg total) by mouth daily.  [name] gabapentin (NEURONTIN) 300 MG capsule TAKE 2 CAPSULES BY MOUTH TWICE A DAY  . glipiZIDE (GLUCOTROL) 5 MG tablet Take 1 tablet (5 mg total) by mouth 2 (two) times daily before a meal.  . hydrALAZINE (APRESOLINE) 25 MG tablet TAKE 2 TABLETS (50 MG TOTAL) BY MOUTH 3 (THREE) TIMES DAILY.  [name] HYDROcodone-acetaminophen (NORCO/VICODIN) 5-325 MG tablet Take 1 tablet by mouth every 6 (six) hours as needed for severe pain.  . hydrOXYzine (ATARAX/VISTARIL) 25 MG tablet TAKE 0.5-1 TABLETS (12.5-25 MG TOTAL) BY MOUTH EVERY 8 (EIGHT) HOURS AS NEEDED FOR ITCHING.  [name] ibuprofen (ADVIL) 600 MG tablet Take 600 mg by mouth every 8 (eight) hours as needed. for pain  . Insulin Detemir (LEVEMIR FLEXTOUCH) 100 UNIT/ML Pen Inject 28 Units into the skin daily.  [name] lidocaine (LIDODERM) 5 % Place 1 patch onto the skin daily. Remove & Discard patch within 12 hours or as directed by MD  . losartan (COZAAR) 100 MG tablet Take 1 tablet (100 mg total) by mouth daily.  . metFORMIN (GLUCOPHAGE-XR) 500 MG 24 hr tablet Take 2 tablets (1,000 mg  total) by mouth 2 (two) times daily.  . methocarbamol (ROBAXIN) 750 MG tablet TAKE 1 TABLET (750 MG TOTAL) BY MOUTH EVERY 12 (TWELVE) HOURS AS NEEDED FOR MUSCLE SPASMS.  . metoprolol succinate (TOPROL-XL) 100 MG 24 hr tablet TAKE 1 TABLET BY MOUTH EVERY DAY  . montelukast (SINGULAIR) 10 MG tablet TAKE 1 TABLET BY MOUTH EVERY DAY AS NEEDED FOR ALLERGIES  . nitroGLYCERIN (NITROSTAT) 0.4 MG SL tablet Place 1 tablet (0.4 mg total) under the tongue every 5 (five) minutes as needed for chest pain.  [name] omega-3 acid ethyl esters (LOVAZA) 1  g capsule TAKE 1 CAPSULE (1 G TOTAL) BY MOUTH 2 (TWO) TIMES DAILY.  [name] ondansetron (ZOFRAN ODT) 4 MG disintegrating tablet 88m ODT q4 hours prn nausea/vomit  . oxyCODONE-acetaminophen (PERCOCET/ROXICET) 5-325 MG tablet Take 1 tablet by mouth every 6 (six) hours as needed for severe pain.  . polyethylene glycol (MIRALAX / GLYCOLAX) packet Take 17 g by mouth daily.  . Polyvinyl Alcohol-Povidone (REFRESH OP) Place 2 drops into both eyes daily as needed (for dry eyes).  . potassium chloride (KLOR-CON 10) 10 MEQ tablet Take 1 tablet (10 mEq total) by mouth daily.  . rosuvastatin (CRESTOR) 20 MG tablet Take 1 tablet (20 mg total) by mouth daily.  . Tiotropium Bromide Monohydrate (SPIRIVA RESPIMAT) 2.5 MCG/ACT AERS Inhale 2 puffs into the lungs daily.  . traZODone (DESYREL) 50 MG tablet TAKE 1/2-1 TABLET BY MOUTH AT BEDTIME AS NEEDED FOR SLEEP  . triamcinolone cream (KENALOG) 0.1 % Apply 1 application topically 2 (two) times daily. Use once or twice a day for poison ivy- apply sparingly to face.  Use no longer than 1 week   No facility-administered encounter medications on file as of 05/07/2019.     Functional Status:  In your present state of health, do you have any difficulty performing the following activities: 12/29/2018  Hearing? N  Vision? N  Difficulty concentrating or making decisions? N  Walking or climbing stairs? Y  Comment trouble walking due to hip pain  Dressing or bathing? N  Doing errands, shopping? N  Preparing Food and eating ? N  Using the Toilet? N  In the past six months, have you accidently leaked urine? Y  Comment wears depends  Do you have problems with loss of bowel control? Y  Comment wears depends  Managing your Medications? N  Managing your Finances? N  Housekeeping or managing your Housekeeping? N  Some recent data might be hidden    Fall/Depression Screening: Fall Risk  05/07/2019 12/29/2018 10/02/2018  Falls in the past year? 1 0 0  Comment Fall in 10/20 - -   Number falls in past yr: 0 - -  Injury with Fall? 1 - -  Comment Left rib fracture - -  Risk for fall due to : History of fall(s);Impaired balance/gait;Impaired mobility;Impaired vision;Medication side effect Medication side effect;Impaired balance/gait;Impaired mobility Impaired balance/gait;Impaired mobility;Impaired vision  Follow up Falls evaluation completed;Education provided;Falls prevention discussed Falls evaluation completed;Education provided;Falls prevention discussed Falls evaluation completed;Falls prevention discussed;Education provided   [facility] 12/29/2018 02/03/2018 10/24/2017 09/01/2017 08/27/2016 09/20/2015  PHQ - 2 Score 2 2 6 6 3  0  PHQ- 9 Score 7 6 9 21 15  -  Exception Documentation - - - - - Patient refusal   THN CM Care Plan Problem One     Most Recent Value  Care Plan Problem One  Knowledge deficiet of COPD diagnosis and self care management of diabetes  Role Documenting the Problem  One  [place] for Problem One  Active  THN Long Term Goal   Patient will report a decrease in A1C by 0.3 points in the next 90 days.  THN Long Term Goal Start Date  05/07/19  Interventions for Problem One Long Term Goal  Congratulated patient on slight reduction of her A1C and discussed goal A1C and ways to help reduce, encouraged to monitor blood sugars atleast once a day, encouraged patient to notify primary care provider for sustained elevated blood sugars, reviewed medications and indicaitons and medication changes recently made and encouraged medication compliance, verified patient understood medication changes using teach back, care plan and goals reviewed and discussed, discused and encouraged low carbohydrate food and drink options  THN CM Short Term Goal #1   Patient will not have any emergency room visits in the next 90 days.  THN CM Short Term Goal #1 Start Date  05/07/19  Interventions for Short Term Goal #1  Encouraged patient to schedule follow up appointment  with primary care provider, encouraged to keep and attend scheduled medical appointments, encouraged patient to follow up with primary care provider prior to going to emergency room for non life threatening concerns, sending Know Before You Go Brochure, fall precautions and preventions reviewed and discussed, encouraged to do deep beathing exercises and use incentive spirtometry  THN CM Short Term Goal #2      THN CM Short Term Goal #2 Start Date  12/29/18  [facility] CM Short Term Goal #2 Met Date  05/07/19     Appointments:  Attended appointment with primary care provider, Dr. Lorelei Pont on 04/29/2019.  Plan: RN Health Coach will send primary care provider quarterly update. RN Health Coach will send patient Know Before You Go Brochure. RN Health Coach will make next telephone outreach to patient within the month of November.  [place] [phone] [email or web address]

## 2019-05-10 ENCOUNTER — Other Ambulatory Visit: Payer: Self-pay | Admitting: Family Medicine

## 2019-05-10 DIAGNOSIS — S2232XD Fracture of one rib, left side, subsequent encounter for fracture with routine healing: Secondary | ICD-10-CM

## 2019-05-10 MED ORDER — OXYCODONE-ACETAMINOPHEN 5-325 MG PO TABS: 1.0000 | ORAL_TABLET | Freq: Four times a day (QID) | ORAL | 0 refills | Status: DC | PRN

## 2019-05-10 NOTE — Telephone Encounter (Signed)
oxyCODONE-acetaminophen (PERCOCET/ROXICET) 5-325 MG tablet    Patient requesting refill.     [id number] [address], [place] [phone] (Phone) [phone] (Fax)

## 2019-05-17 ENCOUNTER — Ambulatory Visit: Payer: Self-pay | Admitting: Pharmacist

## 2019-05-17 ENCOUNTER — Other Ambulatory Visit: Payer: Self-pay | Admitting: Family

## 2019-05-17 DIAGNOSIS — D72829 Elevated white blood cell count, unspecified: Secondary | ICD-10-CM

## 2019-05-18 ENCOUNTER — Inpatient Hospital Stay: Payer: Medicare Other | Attending: Family | Admitting: Family

## 2019-05-18 ENCOUNTER — Inpatient Hospital Stay: Payer: Medicare Other

## 2019-05-18 ENCOUNTER — Telehealth: Payer: Self-pay | Admitting: Family

## 2019-05-18 ENCOUNTER — Encounter: Payer: Self-pay | Admitting: Family

## 2019-05-18 ENCOUNTER — Other Ambulatory Visit: Payer: Self-pay

## 2019-05-18 VITALS — BP 163/63 | HR 80 | Temp 97.0°F | Resp 20 | Ht 59.0 in | Wt 184.8 lb

## 2019-05-18 DIAGNOSIS — I25118 Atherosclerotic heart disease of native coronary artery with other forms of angina pectoris: Secondary | ICD-10-CM | POA: Diagnosis not present

## 2019-05-18 DIAGNOSIS — E1165 Type 2 diabetes mellitus with hyperglycemia: Secondary | ICD-10-CM | POA: Diagnosis not present

## 2019-05-18 DIAGNOSIS — Z8781 Personal history of (healed) traumatic fracture: Secondary | ICD-10-CM | POA: Diagnosis not present

## 2019-05-18 DIAGNOSIS — I2510 Atherosclerotic heart disease of native coronary artery without angina pectoris: Secondary | ICD-10-CM | POA: Insufficient documentation

## 2019-05-18 DIAGNOSIS — Z7982 Long term (current) use of aspirin: Secondary | ICD-10-CM | POA: Diagnosis not present

## 2019-05-18 DIAGNOSIS — E1140 Type 2 diabetes mellitus with diabetic neuropathy, unspecified: Secondary | ICD-10-CM | POA: Insufficient documentation

## 2019-05-18 DIAGNOSIS — E669 Obesity, unspecified: Secondary | ICD-10-CM | POA: Diagnosis not present

## 2019-05-18 DIAGNOSIS — K219 Gastro-esophageal reflux disease without esophagitis: Secondary | ICD-10-CM | POA: Diagnosis not present

## 2019-05-18 DIAGNOSIS — F418 Other specified anxiety disorders: Secondary | ICD-10-CM | POA: Insufficient documentation

## 2019-05-18 DIAGNOSIS — J449 Chronic obstructive pulmonary disease, unspecified: Secondary | ICD-10-CM | POA: Insufficient documentation

## 2019-05-18 DIAGNOSIS — Z806 Family history of leukemia: Secondary | ICD-10-CM | POA: Insufficient documentation

## 2019-05-18 DIAGNOSIS — I10 Essential (primary) hypertension: Secondary | ICD-10-CM | POA: Insufficient documentation

## 2019-05-18 DIAGNOSIS — D72829 Elevated white blood cell count, unspecified: Secondary | ICD-10-CM

## 2019-05-18 DIAGNOSIS — Z79899 Other long term (current) drug therapy: Secondary | ICD-10-CM | POA: Diagnosis not present

## 2019-05-18 DIAGNOSIS — E785 Hyperlipidemia, unspecified: Secondary | ICD-10-CM | POA: Insufficient documentation

## 2019-05-18 DIAGNOSIS — F419 Anxiety disorder, unspecified: Secondary | ICD-10-CM | POA: Insufficient documentation

## 2019-05-18 DIAGNOSIS — Z794 Long term (current) use of insulin: Secondary | ICD-10-CM | POA: Diagnosis not present

## 2019-05-18 LAB — CBC WITH DIFFERENTIAL (CANCER CENTER ONLY)
Abs Immature Granulocytes: 0.05 10*3/uL (ref 0.00–0.07)
Basophils Absolute: 0.1 10*3/uL (ref 0.0–0.1)
Basophils Relative: 1 %
Eosinophils Absolute: 0.6 10*3/uL — ABNORMAL HIGH (ref 0.0–0.5)
Eosinophils Relative: 5 %
HCT: 42.6 % (ref 36.0–46.0)
Hemoglobin: 13.1 g/dL (ref 12.0–15.0)
Immature Granulocytes: 1 %
Lymphocytes Relative: 23 %
Lymphs Abs: 2.5 10*3/uL (ref 0.7–4.0)
MCH: 24.6 pg — ABNORMAL LOW (ref 26.0–34.0)
MCHC: 30.8 g/dL (ref 30.0–36.0)
MCV: 79.9 fL — ABNORMAL LOW (ref 80.0–100.0)
Monocytes Absolute: 0.9 10*3/uL (ref 0.1–1.0)
Monocytes Relative: 8 %
Neutro Abs: 6.9 10*3/uL (ref 1.7–7.7)
Neutrophils Relative %: 62 %
Platelet Count: 327 10*3/uL (ref 150–400)
RBC: 5.33 MIL/uL — ABNORMAL HIGH (ref 3.87–5.11)
RDW: 14.4 % (ref 11.5–15.5)
WBC Count: 11 10*3/uL — ABNORMAL HIGH (ref 4.0–10.5)
nRBC: 0 % (ref 0.0–0.2)

## 2019-05-18 LAB — CMP (CANCER CENTER ONLY)
ALT: 14 U/L (ref 0–44)
AST: 15 U/L (ref 15–41)
Albumin: 4.7 g/dL (ref 3.5–5.0)
Alkaline Phosphatase: 92 U/L (ref 38–126)
Anion gap: 11 (ref 5–15)
BUN: 11 mg/dL (ref 8–23)
CO2: 31 mmol/L (ref 22–32)
Calcium: 10.1 mg/dL (ref 8.9–10.3)
Chloride: 99 mmol/L (ref 98–111)
Creatinine: 0.83 mg/dL (ref 0.44–1.00)
GFR, Est AFR Am: >60 mL/min (ref >60)
GFR, Estimated: >60 mL/min (ref >60)
Glucose, Bld: 126 mg/dL — ABNORMAL HIGH (ref 70–99)
Potassium: 3.8 mmol/L (ref 3.5–5.1)
Sodium: 141 mmol/L (ref 135–145)
Total Bilirubin: 0.4 mg/dL (ref 0.3–1.2)
Total Protein: 7.9 g/dL (ref 6.5–8.1)

## 2019-05-18 LAB — SAVE SMEAR(SSMR), FOR PROVIDER SLIDE REVIEW

## 2019-05-18 LAB — LACTATE DEHYDROGENASE: LDH: 158 U/L (ref 98–192)

## 2019-05-18 NOTE — Progress Notes (Signed)
Hematology/Oncology Consultation   Name: Destiny Ballard      MRN: [id number]    Location: Room/bed info not found  Date: 05/18/2019 Time:11:29 AM   REFERRING PHYSICIAN: Silvestre Mesi, MD  REASON FOR CONSULT: Leukocytosis    DIAGNOSIS: Mild leukocytosis likely reactive - uncontrolled diabetes and recent fall resulting in fractured 7th left rib  HISTORY OF PRESENT ILLNESS: Destiny Ballard is a very pleasant 68 yo caucasian female with an 8 month history of WBC count of 11-14. Looking back over the last 5 years or so it appears that she would occasionally be as high as 12.  Today her WBC count is 11.0. Hgb is 13.1 and platelets 327. Differential is unremarkable.  She did slip and fall in her back yard in October resulting in a fractured left 7th rib. She is still sore and healing.  She does have uncontrolled diabetes. Her Hgb A1c last month was 9.9. She states that she will sometimes have the sweats when her blood sugar is "off".  She does not smoke but states that she has been exposed to second hand smoke daily since she was born. She does have a diagnosis of COPD as a result.  She will have SOB with over exertion.  She has sinus congestion and cough off and on with the changes in season.   No personal history of cancer.  Her sister had leukemia and her mother died with an unknown primary.  No fever, chills, n/v, rash, dizziness, chest pain, palpitations, abdominal pain or changes in bowel or bladder habits.  She has occasional puffiness in her ankles that comes and goes. She does take Lasix daily which helps to minimize fluid retention.  She has diabetic neuropathy in her hands and feet. No episodes of bleeding. No bruising or petechiae.  She has maintained a good appetite and is staying well hydrated. With the diabetes she states that she stays thirsty.  She rarely has an alcoholic beverage socially and denies using recreational drugs.  She states that she is not very active and does not  exercise.  She does enjoy rescuing squirrels.  She is retired from the [employer].   ROS: All other 10 point review of systems is negative.   PAST MEDICAL HISTORY:   Past Medical History:  Diagnosis Date  . Anxiety    Prior suicide attempt  . CAD (coronary artery disease)    a) s/p DES to LAD 07/2005 b) Last Myoview low risk 11/2011 showing small fixed apical perfusion defect (prior MI vs attenuation) but no ischemia - normal EF.  [name] Cervical spondylosis   . Coronary atherosclerosis 06/28/2008  . Depression   . Depression with anxiety   . Diabetes mellitus without complication ([place])   . GERD (gastroesophageal reflux disease)   . Hyperlipidemia   . Hypertension   . Insulin resistance   . Iron deficiency anemia   . Obesity     ALLERGIES: Allergies  Allergen Reactions  . Prednisone Other (See Comments)    REACTION: mood swings, nightmares. "Shot doesn't bother me, reaction is just with the pill" she states she has had the steroid injections before. From our records methylprednisone was given to her in 2013 without any complications.      MEDICATIONS:  Current Outpatient Medications on File Prior to Visit  Medication Sig Dispense Refill  . albuterol (PROAIR HFA) 108 (90 Base) MCG/ACT inhaler Inhale 2 puffs into the lungs every 4 (four) hours as needed for wheezing or shortness of breath.  3 Inhaler 1  . aspirin EC 81 MG tablet Take 81 mg by mouth daily.    . benzonatate (TESSALON) 200 MG capsule TAKE 1 CAPSULE BY MOUTH 3 TIMES A DAY AS NEEDED FOR COUGH 90 capsule 5  . calcium carbonate (TUMS - DOSED IN MG ELEMENTAL CALCIUM) 500 MG chewable tablet Chew 2 tablets by mouth daily as needed for indigestion or heartburn.    . cetirizine (ZYRTEC) 10 MG tablet TAKE 1 TABLET BY MOUTH DAILY AS NEEDED FOR ALLERGIES 90 tablet 1  . Cholecalciferol (VITAMIN D3) 1000 UNITS CAPS Take 1,000 Units by mouth daily.     . clonazePAM (KLONOPIN) 0.5 MG tablet TAKE 0.5 TABLETS (0.25 MG TOTAL) BY MOUTH 2  TIMES DAILY AS NEEDED FOR ANXIETY 20 tablet 1  . dapagliflozin propanediol (FARXIGA) 10 MG TABS tablet Take 10 mg by mouth daily. 90 tablet 3  . FLUoxetine (PROZAC) 40 MG capsule Take 1 capsule (40 mg total) by mouth daily. 90 capsule 3  . fluticasone (FLONASE) 50 MCG/ACT nasal spray SPRAY 2 SPRAYS INTO EACH NOSTRIL EVERY DAY 48 g 0  . furosemide (LASIX) 20 MG tablet Take 1 tablet (20 mg total) by mouth daily. 30 tablet 6  . gabapentin (NEURONTIN) 300 MG capsule TAKE 2 CAPSULES BY MOUTH TWICE A DAY 360 capsule 1  . glipiZIDE (GLUCOTROL) 5 MG tablet Take 1 tablet (5 mg total) by mouth 2 (two) times daily before a meal. 180 tablet 3  . hydrALAZINE (APRESOLINE) 25 MG tablet TAKE 2 TABLETS (50 MG TOTAL) BY MOUTH 3 (THREE) TIMES DAILY. 540 tablet 2  . HYDROcodone-acetaminophen (NORCO/VICODIN) 5-325 MG tablet Take 1 tablet by mouth every 6 (six) hours as needed for severe pain. 20 tablet 0  . hydrOXYzine (ATARAX/VISTARIL) 25 MG tablet TAKE 0.5-1 TABLETS (12.5-25 MG TOTAL) BY MOUTH EVERY 8 (EIGHT) HOURS AS NEEDED FOR ITCHING. 270 tablet 1  . ibuprofen (ADVIL) 600 MG tablet Take 600 mg by mouth every 8 (eight) hours as needed. for pain    . Insulin Detemir (LEVEMIR FLEXTOUCH) 100 UNIT/ML Pen Inject 28 Units into the skin daily. 15 mL 3  . lidocaine (LIDODERM) 5 % Place 1 patch onto the skin daily. Remove & Discard patch within 12 hours or as directed by MD 30 patch 0  . losartan (COZAAR) 100 MG tablet Take 1 tablet (100 mg total) by mouth daily. 90 tablet 3  . metFORMIN (GLUCOPHAGE-XR) 500 MG 24 hr tablet Take 2 tablets (1,000 mg total) by mouth 2 (two) times daily. 360 tablet 1  . methocarbamol (ROBAXIN) 750 MG tablet TAKE 1 TABLET (750 MG TOTAL) BY MOUTH EVERY 12 (TWELVE) HOURS AS NEEDED FOR MUSCLE SPASMS. 60 tablet 3  . metoprolol succinate (TOPROL-XL) 100 MG 24 hr tablet TAKE 1 TABLET BY MOUTH EVERY DAY 90 tablet 3  . montelukast (SINGULAIR) 10 MG tablet TAKE 1 TABLET BY MOUTH EVERY DAY AS NEEDED FOR  ALLERGIES 90 tablet 1  . nitroGLYCERIN (NITROSTAT) 0.4 MG SL tablet Place 1 tablet (0.4 mg total) under the tongue every 5 (five) minutes as needed for chest pain. 20 tablet 3  . omega-3 acid ethyl esters (LOVAZA) 1 g capsule TAKE 1 CAPSULE (1 G TOTAL) BY MOUTH 2 (TWO) TIMES DAILY. 180 capsule 1  . ondansetron (ZOFRAN ODT) 4 MG disintegrating tablet '4mg'$  ODT q4 hours prn nausea/vomit 20 tablet 0  . oxyCODONE-acetaminophen (PERCOCET/ROXICET) 5-325 MG tablet Take 1 tablet by mouth every 6 (six) hours as needed for severe pain. 15 tablet 0  .  polyethylene glycol (MIRALAX / GLYCOLAX) packet Take 17 g by mouth daily. 14 each 0  . Polyvinyl Alcohol-Povidone (REFRESH OP) Place 2 drops into both eyes daily as needed (for dry eyes).    . potassium chloride (KLOR-CON 10) 10 MEQ tablet Take 1 tablet (10 mEq total) by mouth daily. 90 tablet 1  . rosuvastatin (CRESTOR) 20 MG tablet Take 1 tablet (20 mg total) by mouth daily. 90 tablet 3  . Tiotropium Bromide Monohydrate (SPIRIVA RESPIMAT) 2.5 MCG/ACT AERS Inhale 2 puffs into the lungs daily. 1 Inhaler 11  . traZODone (DESYREL) 50 MG tablet TAKE 1/2-1 TABLET BY MOUTH AT BEDTIME AS NEEDED FOR SLEEP 90 tablet 1  . triamcinolone cream (KENALOG) 0.1 % Apply 1 application topically 2 (two) times daily. Use once or twice a day for poison ivy- apply sparingly to face.  Use no longer than 1 week 30 g 0   No current facility-administered medications on file prior to visit.      PAST SURGICAL HISTORY Past Surgical History:  Procedure Laterality Date  . BREAST ENHANCEMENT SURGERY    . CARDIAC CATHETERIZATION  06/17/2007   NORMAL. EF 60%  . CARDIAC CATHETERIZATION N/A 01/29/2016   Procedure: Left Heart Cath and Coronary Angiography;  Surgeon: Sherren Mocha, MD;  Location: [facility];  Service: Cardiovascular;  Laterality: N/A;  . CERVICAL SPONDYLOSIS     SINGLE LEVEL FUSION  . CHILDBIRTH     X3  . CORONARY STENT PLACEMENT  07/2005   LEFT ANTERIOR DESCENDING   . FOREARM FRACTURE SURGERY  2010   hand and shoulder   . INCISION AND DRAINAGE BREAST ABSCESS  01/05/2012      . INCISION AND DRAINAGE PERIRECTAL ABSCESS N/A 02/18/2014   Procedure: IRRIGATION AND DEBRIDEMENT PERIRECTAL ABSCESS;  Surgeon: Pedro Earls, MD;  Location: [facility];  Service: General;  Laterality: N/A;  . LEFT HEART CATH AND CORONARY ANGIOGRAPHY N/A 10/10/2017   Procedure: LEFT HEART CATH AND CORONARY ANGIOGRAPHY;  Surgeon: Burnell Blanks, MD;  Location: [facility];  Service: Cardiovascular;  Laterality: N/A;  . LUMBAR LAMINECTOMY    . ROTATOR CUFF REPAIR     bilaterla  . TONSILLECTOMY AND ADENOIDECTOMY    . TUBAL LIGATION    . VIDEO BRONCHOSCOPY Bilateral 08/13/2016   Procedure: VIDEO BRONCHOSCOPY WITHOUT FLUORO;  Surgeon: Collene Gobble, MD;  Location: [facility];  Service: Cardiopulmonary;  Laterality: Bilateral;    FAMILY HISTORY: Family History  Problem Relation Age of Onset  . Heart attack Mother   . Diabetes Mother   . Lung cancer Mother   . Asthma Mother   . Heart disease Mother   . Suicidality Father        "killed himself"  . Asthma Daughter        x 2  . Cancer Daughter        pre-cancerous polyp  . Diabetes Sister   . Cancer Sister   . Cervical cancer Daughter        cervical   . Allergies Other        all family--seasonal allergies    SOCIAL HISTORY:  reports that she is a non-smoker but has been exposed to tobacco smoke. She has never used smokeless tobacco. She reports current alcohol use. She reports that she does not use drugs.  PERFORMANCE STATUS: The patient's performance status is 1 - Symptomatic but completely ambulatory  PHYSICAL EXAM: Most Recent Vital Signs: Blood pressure (!) 163/63, pulse 80, temperature (!)  27 F (36.1 C), temperature source Temporal, resp. rate 20, height 4' 11"  (1.499 m), weight 184 lb 12.8 oz (83.8 kg), SpO2 95 %. BP (!) 163/63 (BP Location: Left Arm, Patient Position: Sitting)   Pulse 80    Temp (!) 97 F (36.1 C) (Temporal)   Resp 20   Ht 4' 11"  (1.499 m)   Wt 184 lb 12.8 oz (83.8 kg)   SpO2 95%   BMI 37.33 kg/m   General Appearance:    Alert, cooperative, no distress, appears stated age  Head:    Normocephalic, without obvious abnormality, atraumatic  Eyes:    PERRL, conjunctiva/corneas clear, EOM's intact, fundi    benign, both eyes        Throat:   Lips, mucosa, and tongue normal; teeth and gums normal  Neck:   Supple, symmetrical, trachea midline, no adenopathy;    thyroid:  no enlargement/tenderness/nodules; no carotid   bruit or JVD  Back:     Symmetric, no curvature, ROM normal, no CVA tenderness  Lungs:     Clear to auscultation bilaterally, respirations unlabored  Chest Wall:    No tenderness or deformity   Heart:    Regular rate and rhythm, S1 and S2 normal, no murmur, rub   or gallop     Abdomen:     Soft, non-tender, bowel sounds active all four quadrants,    no masses, no organomegaly        Extremities:   Extremities normal, atraumatic, no cyanosis or edema  Pulses:   2+ and symmetric all extremities  Skin:   Skin color, texture, turgor normal, no rashes or lesions  Lymph nodes:   Cervical, supraclavicular, and axillary nodes normal  Neurologic:   CNII-XII intact, normal strength, sensation and reflexes    throughout    LABORATORY DATA:  Results for orders placed or performed in visit on 05/18/19 (from the past 48 hour(s))  CBC with Differential ([facility] Only)     Status: Abnormal   Collection Time: 05/18/19 10:46 AM  Result Value Ref Range   WBC Count 11.0 (H) 4.0 - 10.5 K/uL   RBC 5.33 (H) 3.87 - 5.11 MIL/uL   Hemoglobin 13.1 12.0 - 15.0 g/dL   HCT 42.6 36.0 - 46.0 %   MCV 79.9 (L) 80.0 - 100.0 fL   MCH 24.6 (L) 26.0 - 34.0 pg   MCHC 30.8 30.0 - 36.0 g/dL   RDW 14.4 11.5 - 15.5 %   Platelet Count 327 150 - 400 K/uL   nRBC 0.0 0.0 - 0.2 %   Neutrophils Relative % 62 %   Neutro Abs 6.9 1.7 - 7.7 K/uL   Lymphocytes Relative 23 %    Lymphs Abs 2.5 0.7 - 4.0 K/uL   Monocytes Relative 8 %   Monocytes Absolute 0.9 0.1 - 1.0 K/uL   Eosinophils Relative 5 %   Eosinophils Absolute 0.6 (H) 0.0 - 0.5 K/uL   Basophils Relative 1 %   Basophils Absolute 0.1 0.0 - 0.1 K/uL   Immature Granulocytes 1 %   Abs Immature Granulocytes 0.05 0.00 - 0.07 K/uL    Comment: Performed at [facility] at [facility], [address] [place], [place] [address]  CMP ([place] only)     Status: Abnormal   Collection Time: 05/18/19 10:46 AM  Result Value Ref Range   Sodium 141 135 - 145 mmol/L   Potassium 3.8 3.5 - 5.1 mmol/L   Chloride 99  98 - 111 mmol/L   CO2 31 22 - 32 mmol/L   Glucose, Bld 126 (H) 70 - 99 mg/dL   BUN 11 8 - 23 mg/dL   Creatinine 0.83 0.44 - 1.00 mg/dL   Calcium 10.1 8.9 - 10.3 mg/dL   Total Protein 7.9 6.5 - 8.1 g/dL   Albumin 4.7 3.5 - 5.0 g/dL   AST 15 15 - 41 U/L   ALT 14 0 - 44 U/L   Alkaline Phosphatase 92 38 - 126 U/L   Total Bilirubin 0.4 0.3 - 1.2 mg/dL   GFR, Est Non Af Am >60 >60 mL/min   GFR, Est AFR Am >60 >60 mL/min   Anion gap 11 5 - 15    Comment: Performed at [facility] at [facility], [address] [place], [place] [address]  Save Smear [facility])     Status: None   Collection Time: 05/18/19 10:46 AM  Result Value Ref Range   Smear Review SMEAR STAINED AND AVAILABLE FOR REVIEW     Comment: Performed at [facility] at [facility], [address] [place], [place] [address]      RADIOGRAPHY: No results found.     PATHOLOGY: None  ASSESSMENT/PLAN: Ms. Maffeo is a very pleasant 68 yo caucasian female with a history of mild leukocytosis felt to be reactive secondary to uncontrolled diabetes and recent injury.  Her count today is stable at 11.0. Differential reviewed and unremarkable.  She is asymptomatic at this time and has no complaints other than tenderness in the fractured rib area.  We will continue to  follow along with her and plan to see her back in another 6 months.   All questions were answered and she is in agreement with the plan. She will contact our office with any questions or concerns. We can certainly see her sooner if needed.   She was discussed with and also seen by Dr. Marin Olp and he is in agreement with the aforementioned.   Laverna Peace, NP     Addendum: I saw and examined Ms. Hardin Negus with Judson Roch.  I agree with the above assessment by Judson Roch.  I looked at her blood smear.  I really do not see anything that looked like there was a myeloproliferative process.  She had good maturation of her white blood cells.  I saw no immature myeloid or lymphoid forms.  There were no hypersegmented polys.  There were no nucleated red blood cells.  Her white cell count is minimally elevated.  I really cannot believe that she is going to have any type of hematologic issue.  I think this is much more reactive.  We spent about 40 minutes with Ms. Hardin Negus.  We went over her lab work.  I reassured her that I just did not see that we had to do anything invasive like a bone marrow biopsy.  I do not think she needs any scans or x-rays to be done.  I would like to see her back in 6 months.  If her white cell count is no different or better in 6 months, then we will likely let her go from our clinic.  She was very nice.  She was very nice to talk to.  She was relieved that we did not think that there was any type of blood problem that we had to treat.  Lattie Haw, MD

## 2019-05-18 NOTE — Telephone Encounter (Signed)
Called and LMVM for patient w/ date/time of follow up appointments per 11/3 los

## 2019-05-19 ENCOUNTER — Ambulatory Visit (HOSPITAL_BASED_OUTPATIENT_CLINIC_OR_DEPARTMENT_OTHER)
Admission: RE | Admit: 2019-05-19 | Discharge: 2019-05-19 | Disposition: A | Discharge status: Home / Self Care | Payer: Medicare Other | Source: Ambulatory Visit | Attending: Family Medicine | Admitting: Family Medicine

## 2019-05-19 ENCOUNTER — Other Ambulatory Visit: Payer: Self-pay

## 2019-05-19 ENCOUNTER — Ambulatory Visit (INDEPENDENT_AMBULATORY_CARE_PROVIDER_SITE_OTHER): Payer: Medicare Other | Admitting: Family Medicine

## 2019-05-19 ENCOUNTER — Encounter: Payer: Self-pay | Admitting: Family Medicine

## 2019-05-19 VITALS — BP 136/70 | HR 74 | Temp 98.0°F | Resp 20 | Ht 59.0 in | Wt 179.0 lb

## 2019-05-19 DIAGNOSIS — S2232XA Fracture of one rib, left side, initial encounter for closed fracture: Secondary | ICD-10-CM | POA: Diagnosis not present

## 2019-05-19 DIAGNOSIS — I25118 Atherosclerotic heart disease of native coronary artery with other forms of angina pectoris: Secondary | ICD-10-CM

## 2019-05-19 DIAGNOSIS — E1165 Type 2 diabetes mellitus with hyperglycemia: Secondary | ICD-10-CM

## 2019-05-19 DIAGNOSIS — E118 Type 2 diabetes mellitus with unspecified complications: Secondary | ICD-10-CM

## 2019-05-19 DIAGNOSIS — IMO0002 Reserved for concepts with insufficient information to code with codable children: Secondary | ICD-10-CM

## 2019-05-19 DIAGNOSIS — S2232XD Fracture of one rib, left side, subsequent encounter for fracture with routine healing: Secondary | ICD-10-CM

## 2019-05-19 DIAGNOSIS — I10 Essential (primary) hypertension: Secondary | ICD-10-CM

## 2019-05-19 DIAGNOSIS — Z794 Long term (current) use of insulin: Secondary | ICD-10-CM | POA: Diagnosis not present

## 2019-05-19 MED ORDER — OXYCODONE-ACETAMINOPHEN 5-325 MG PO TABS: 1.0000 | ORAL_TABLET | Freq: Four times a day (QID) | ORAL | 0 refills | Status: DC | PRN

## 2019-05-19 NOTE — Patient Instructions (Signed)
It was good to see you today, but I am sorry that your rib is still hurting you so much Please go to the imaging dept on the ground floor and have repeat films today- then you can go home and I will be in touch asap I refilled your pain medication- this should be the last rx of this medications if at all possible as it is habit forming.  You can use the hydrocodone that you have at home if any residual pain   Please take your glipizide TWICE a day to help bring down your A1c

## 2019-05-19 NOTE — Progress Notes (Signed)
[facility] [address] [place], [place] [address] [phone]  Date:  05/19/2019   Name:  Destiny Ballard   DOB:  1950/12/24   MRN:  [id number]  PCP:  Darreld Mclean, MD    Chief Complaint: Upper Body Pain (fall, rib pain radiating to back)   History of Present Illness:  Destiny Ballard is a 68 y.o. very pleasant female patient who presents with the following:  Pt with history of poorly controlled diabetes, CHF, CAD, COPD, sleep apnea, hypertension, hyperlipidemia I saw her on October 15 after she had fallen on the way to her mailbox.  She had previously been seen in the ER and thought to have likely rib fracture although not visible on x-ray She is having quite a bit of pain, and I treated her with oxycodone She also ended up being seen at an urgent care on October 17, where an x-ray did show probable fracture of the left seventh rib  Today she notes continued pain in her left rib when she uses her left arm She has 1 oxycodone left  No belly pain She is using her spirometer No fever noted She always has a cough due to secondhand smoke exposure.  No change She just want to be sure that she is healing okay and that her lungs are clear  Dg Ribs Unilateral W/chest Left  Result Date: 05/01/2019 CLINICAL DATA:  Patient fell on 10/12 with persistent left-sided rib pain. EXAM: LEFT RIBS AND CHEST - 3+ VIEW COMPARISON:  Left rib radiographic series-04/26/2019; chest CT-12/24/2018 FINDINGS: Examination is degraded due to patient body habitus. Grossly unchanged cardiac silhouette and mediastinal contours. There is unchanged mild diffuse slightly nodular thickening of the pulmonary interstitium. Unchanged bibasilar heterogeneous opacities favored to represent atelectasis. No new focal airspace opacities. No pleural effusion or pneumothorax. No evidence of edema. There is a potential nondisplaced fracture involving the  lateral aspect of the left seventh rib. Post lower cervical ACDF, incompletely evaluated. Presumed postoperative change involving the distal aspect of the left clavicle. Post bilateral breast augmentation. Regional soft tissues appear otherwise normal. No radiopaque foreign body. IMPRESSION: 1. Suspected nondisplaced fracture involving the lateral aspect the left 7th rib without associated pleural effusion, pneumothorax or radiopaque foreign body. 2. Findings suggestive of airways disease / bronchitis. Electronically Signed   By: Sandi Mariscal M.D.   On: 05/01/2019 08:33   Dg Ribs Unilateral W/chest Left  Result Date: 04/26/2019 CLINICAL DATA:  Slipped and fell.  Rib pain. EXAM: LEFT RIBS AND CHEST - 3+ VIEW COMPARISON:  12/24/2018 FINDINGS: Heart is normal size. Increasing bibasilar opacities, right greater than left which could reflect atelectasis or infiltrates. No visible rib fracture or pneumothorax. Bilateral breast implants noted. IMPRESSION: No visible displaced rib fracture. Bibasilar atelectasis or infiltrates, right greater than left could reflect atelectasis or infiltrates. Electronically Signed   By: Rolm Baptise M.D.   On: 04/26/2019 12:21   05/10/2019  2   05/10/2019  Oxycodone-Acetaminophen 5-325  15.00  4 Je Cop   [id number]   [place] ([id number])   0  28.12 MME  Comm Ins   [place]  05/03/2019  2   05/03/2019  Oxycodone-Acetaminophen 5-325  30.00  7 Je Cop   [id number]   Nor ([id number])   0  32.14 MME  Comm Ins   [place]  04/29/2019  2   04/29/2019  Hydrocodone-Acetamin 5-325 MG  20.00  6 Je Cop  [id number]   [place] ([id number])   0  16.67 MME  Comm Ins   [place]  04/26/2019  4   04/26/2019  Hydrocodone-Acetamin 5-325 MG  15.00  4 Ma Ven   [id number]   Med ([id number])   0  18.75 MME  Comm Ins   [place]  01/12/2019  2   01/12/2019  Hydrocodone-Acetamin 5-325 MG  15.00  3 Ma Pfe   [id number]   [place] ([id number])   0  25.00 MME  Comm Ins   [place]  08/25/2018  2   08/23/2018  Hydrocodone-Acetamin 5-325 MG  20.00  3 Ca Isa   [id number]   [place] ([id number])   0  33.33 MME       Lab Results  Component Value Date   HGBA1C 9.9 (H) 04/29/2019   I had asked her to increase her glipizide to twice a day in response to most recent A1c.  It is not clear if she did this, we went over this change again today  Patient Active Problem List   Diagnosis Date Noted  . Obesity (BMI 30-39.9) 08/24/2018  . Depression with anxiety 08/24/2018  . CAD (coronary artery disease) 08/24/2018  . Abnormal cardiovascular stress test 08/24/2018  . Lobar pneumonia, unspecified organism ([place]) 11/13/2017  . Precordial chest pain   . Sepsis ([place]) 09/18/2017  . Chronic diastolic CHF (congestive heart failure) ([place]) 09/18/2017  . Left hip pain 10/28/2016  . Chronic rhinitis 08/07/2016  . COPD with acute exacerbation ([place]) 04/25/2016  . Essential hypertension 01/30/2016  . Hypokalemia 01/27/2016  . HLD (hyperlipidemia)   . Type 2 diabetes mellitus without complication, without long-term current use of insulin ([place])   . OSA (obstructive sleep apnea) 05/06/2014  . Dyspnea on exertion 09/02/2012  . Chest pain 12/10/2011  . Chronic cough 05/11/2010  . GERD 06/28/2008    Past Medical History:  Diagnosis Date  . Anxiety    Prior suicide attempt  . CAD (coronary artery disease)    a) s/p DES to LAD 07/2005 b) Last Myoview low risk 11/2011 showing small fixed apical perfusion defect (prior MI vs attenuation) but no ischemia - normal EF.  [name] Cervical spondylosis   . Coronary atherosclerosis 06/28/2008  . Depression   . Depression with anxiety   . Diabetes mellitus without complication ([place])   . GERD (gastroesophageal reflux disease)   . Hyperlipidemia   . Hypertension   . Insulin resistance   . Iron deficiency anemia   . Obesity     Past Surgical History:  Procedure Laterality Date  . BREAST ENHANCEMENT SURGERY    . CARDIAC CATHETERIZATION  06/17/2007   NORMAL. EF 60%  . CARDIAC CATHETERIZATION N/A 01/29/2016   Procedure: Left Heart Cath and Coronary Angiography;  Surgeon: Sherren Mocha, MD;  Location: [facility];  Service: Cardiovascular;  Laterality: N/A;  . CERVICAL SPONDYLOSIS     SINGLE LEVEL FUSION  . CHILDBIRTH     X3  . CORONARY STENT PLACEMENT  07/2005   LEFT ANTERIOR DESCENDING  . FOREARM FRACTURE SURGERY  2010   hand and shoulder   . INCISION AND DRAINAGE BREAST ABSCESS  01/05/2012      . INCISION AND DRAINAGE PERIRECTAL ABSCESS N/A 02/18/2014   Procedure: IRRIGATION AND DEBRIDEMENT PERIRECTAL ABSCESS;  Surgeon: Pedro Earls, MD;  Location: [facility];  Service: General;  Laterality: N/A;  . LEFT HEART CATH AND CORONARY ANGIOGRAPHY N/A 10/10/2017   Procedure: LEFT HEART CATH AND CORONARY ANGIOGRAPHY;  Surgeon: Angelena Form,  Annita Brod, MD;  Location: [facility];  Service: Cardiovascular;  Laterality: N/A;  . LUMBAR LAMINECTOMY    . ROTATOR CUFF REPAIR     [place]  . TONSILLECTOMY AND ADENOIDECTOMY    . TUBAL LIGATION    . VIDEO BRONCHOSCOPY Bilateral 08/13/2016   Procedure: VIDEO BRONCHOSCOPY WITHOUT FLUORO;  Surgeon: Collene Gobble, MD;  Location: [facility];  Service: Cardiopulmonary;  Laterality: Bilateral;    Social History   Tobacco Use  . Smoking status: Passive Smoke Exposure - Never Smoker  . Smokeless tobacco: Never Used  Substance Use Topics  . Alcohol use: Yes    Comment: occ  . Drug use: No    Family History  Problem Relation Age of Onset  . Heart attack Mother   . Diabetes Mother   . Lung cancer Mother   . Asthma Mother   . Heart disease Mother   . Suicidality Father        "killed himself"  . Asthma Daughter        x 2  . Cancer Daughter        pre-cancerous polyp  . Diabetes Sister   . Cancer Sister   . Cervical cancer Daughter        cervical   . Allergies Other        all family--seasonal allergies    Allergies  Allergen Reactions  . Prednisone Other (See Comments)    REACTION: mood swings, nightmares. "Shot doesn't bother me, reaction is just with the pill" she states she has had the steroid  injections before. From our records methylprednisone was given to her in 2013 without any complications.    Medication list has been reviewed and updated.  Current Outpatient Medications on File Prior to Visit  Medication Sig Dispense Refill  . albuterol (PROAIR HFA) 108 (90 Base) MCG/ACT inhaler Inhale 2 puffs into the lungs every 4 (four) hours as needed for wheezing or shortness of breath. 3 Inhaler 1  . aspirin EC 81 MG tablet Take 81 mg by mouth daily.    . benzonatate (TESSALON) 200 MG capsule TAKE 1 CAPSULE BY MOUTH 3 TIMES A DAY AS NEEDED FOR COUGH 90 capsule 5  . calcium carbonate (TUMS - DOSED IN MG ELEMENTAL CALCIUM) 500 MG chewable tablet Chew 2 tablets by mouth daily as needed for indigestion or heartburn.    . cetirizine (ZYRTEC) 10 MG tablet TAKE 1 TABLET BY MOUTH DAILY AS NEEDED FOR ALLERGIES 90 tablet 1  . Cholecalciferol (VITAMIN D3) 1000 UNITS CAPS Take 1,000 Units by mouth daily.     . clonazePAM (KLONOPIN) 0.5 MG tablet TAKE 0.5 TABLETS (0.25 MG TOTAL) BY MOUTH 2 TIMES DAILY AS NEEDED FOR ANXIETY 20 tablet 1  . dapagliflozin propanediol (FARXIGA) 10 MG TABS tablet Take 10 mg by mouth daily. 90 tablet 3  . FLUoxetine (PROZAC) 40 MG capsule Take 1 capsule (40 mg total) by mouth daily. 90 capsule 3  . fluticasone (FLONASE) 50 MCG/ACT nasal spray SPRAY 2 SPRAYS INTO EACH NOSTRIL EVERY DAY 48 g 0  . furosemide (LASIX) 20 MG tablet Take 1 tablet (20 mg total) by mouth daily. 30 tablet 6  . gabapentin (NEURONTIN) 300 MG capsule TAKE 2 CAPSULES BY MOUTH TWICE A DAY 360 capsule 1  . glipiZIDE (GLUCOTROL) 5 MG tablet Take 1 tablet (5 mg total) by mouth 2 (two) times daily before a meal. 180 tablet 3  . hydrALAZINE (APRESOLINE) 25 MG tablet TAKE 2 TABLETS (50 MG  TOTAL) BY MOUTH 3 (THREE) TIMES DAILY. 540 tablet 2  . HYDROcodone-acetaminophen (NORCO/VICODIN) 5-325 MG tablet Take 1 tablet by mouth every 6 (six) hours as needed for severe pain. 20 tablet 0  . hydrOXYzine  (ATARAX/VISTARIL) 25 MG tablet TAKE 0.5-1 TABLETS (12.5-25 MG TOTAL) BY MOUTH EVERY 8 (EIGHT) HOURS AS NEEDED FOR ITCHING. 270 tablet 1  . ibuprofen (ADVIL) 600 MG tablet Take 600 mg by mouth every 8 (eight) hours as needed. for pain    . Insulin Detemir (LEVEMIR FLEXTOUCH) 100 UNIT/ML Pen Inject 28 Units into the skin daily. 15 mL 3  . lidocaine (LIDODERM) 5 % Place 1 patch onto the skin daily. Remove & Discard patch within 12 hours or as directed by MD 30 patch 0  . losartan (COZAAR) 100 MG tablet Take 1 tablet (100 mg total) by mouth daily. 90 tablet 3  . metFORMIN (GLUCOPHAGE-XR) 500 MG 24 hr tablet Take 2 tablets (1,000 mg total) by mouth 2 (two) times daily. 360 tablet 1  . methocarbamol (ROBAXIN) 750 MG tablet TAKE 1 TABLET (750 MG TOTAL) BY MOUTH EVERY 12 (TWELVE) HOURS AS NEEDED FOR MUSCLE SPASMS. 60 tablet 3  . metoprolol succinate (TOPROL-XL) 100 MG 24 hr tablet TAKE 1 TABLET BY MOUTH EVERY DAY 90 tablet 3  . montelukast (SINGULAIR) 10 MG tablet TAKE 1 TABLET BY MOUTH EVERY DAY AS NEEDED FOR ALLERGIES 90 tablet 1  . nitroGLYCERIN (NITROSTAT) 0.4 MG SL tablet Place 1 tablet (0.4 mg total) under the tongue every 5 (five) minutes as needed for chest pain. 20 tablet 3  . omega-3 acid ethyl esters (LOVAZA) 1 g capsule TAKE 1 CAPSULE (1 G TOTAL) BY MOUTH 2 (TWO) TIMES DAILY. 180 capsule 1  . ondansetron (ZOFRAN ODT) 4 MG disintegrating tablet 4mg  ODT q4 hours prn nausea/vomit 20 tablet 0  . oxyCODONE-acetaminophen (PERCOCET/ROXICET) 5-325 MG tablet Take 1 tablet by mouth every 6 (six) hours as needed for severe pain. 15 tablet 0  . polyethylene glycol (MIRALAX / GLYCOLAX) packet Take 17 g by mouth daily. 14 each 0  . Polyvinyl Alcohol-Povidone (REFRESH OP) Place 2 drops into both eyes daily as needed (for dry eyes).    . potassium chloride (KLOR-CON 10) 10 MEQ tablet Take 1 tablet (10 mEq total) by mouth daily. 90 tablet 1  . rosuvastatin (CRESTOR) 20 MG tablet Take 1 tablet (20 mg total) by  mouth daily. 90 tablet 3  . Tiotropium Bromide Monohydrate (SPIRIVA RESPIMAT) 2.5 MCG/ACT AERS Inhale 2 puffs into the lungs daily. 1 Inhaler 11  . traZODone (DESYREL) 50 MG tablet TAKE 1/2-1 TABLET BY MOUTH AT BEDTIME AS NEEDED FOR SLEEP 90 tablet 1  . triamcinolone cream (KENALOG) 0.1 % Apply 1 application topically 2 (two) times daily. Use once or twice a day for poison ivy- apply sparingly to face.  Use no longer than 1 week 30 g 0   No current facility-administered medications on file prior to visit.     Review of Systems:  As per HPI- otherwise negative.   Physical Examination: Vitals:   05/19/19 1344  BP: 136/70  Pulse: 74  Resp: 20  Temp: 98 F (36.7 C)  SpO2: 94%   Vitals:   05/19/19 1344  Weight: 179 lb (81.2 kg)  Height: 4\' 11"  (1.499 m)   Body mass index is 36.15 kg/m. Ideal Body Weight: Weight in (lb) to have BMI = 25: 123.5  GEN: WDWN, NAD, Non-toxic, A & O x 3 HEENT: Atraumatic, Normocephalic. Neck supple. No  masses, No LAD. Ears and Nose: No external deformity. CV: RRR, No M/G/R. No JVD. No thrill. No extra heart sounds. PULM: CTA B, no wheezes, crackles, rhonchi. No retractions. No resp. distress. No accessory muscle use. ABD: S, NT, ND, +BS. No rebound. No HSM. EXTR: No c/c/e NEURO Normal gait.  PSYCH: Normally interactive. Conversant. Not depressed or anxious appearing.  Calm demeanor.  She continues to have tenderness over the left seventh rib both laterally and posteriorly, which is her fractured rib.  No ecchymosis is noted.  Lungs are clear   Assessment and Plan: Uncontrolled type 2 diabetes mellitus with complication, with long-term current use of insulin (HCC)  Closed fracture of one rib of left side with routine healing, subsequent encounter - Plan: DG Ribs Unilateral W/Chest Left, oxyCODONE-acetaminophen (PERCOCET/ROXICET) 5-325 MG tablet  Essential hypertension, benign  Following up today on left-sided rib fracture which occurred in a  fall about 3 weeks ago.  She is getting somewhat better but still has pain.  Would like to repeat her films and make sure her lungs look clear today. This is certainly fine, will obtain left rib and chest films for her Blood pressure is under control Went over instructions for her diabetes medication, increase glipizide to twice daily  Signed Lamar Blinks, MD  Received her x-ray report as follows, gave patient a call.  LMOM- rib appears to be healing  No pneumonia but she does show chronic congestion or scarring  Use spirometer, take deep breaths.  Please contact me if not improving   CLINICAL DATA:  Recent rib fracture with increasing pain.  EXAM: LEFT RIBS AND CHEST - 3+ VIEW  COMPARISON:  05/01/2019  FINDINGS: The cardiomediastinal silhouette is unchanged with normal heart size. There is chronic peribronchial thickening. Mild atelectasis or scarring is present in the lung bases. No sizable pleural effusion or pneumothorax is identified. The suspected nondisplaced left lateral seventh rib fracture on the prior study is less conspicuous today, however there may be a slight cortical step-off on one of the oblique images. No new or displaced rib fracture is identified.  IMPRESSION: 1. Suspected nondisplaced left seventh rib fracture on the prior study is less well seen today. 2. No new or displaced rib fracture is identified. 3. Chronic bronchitic changes with mild bibasilar atelectasis or scarring.

## 2019-05-26 ENCOUNTER — Other Ambulatory Visit: Payer: Self-pay | Admitting: Pharmacist

## 2019-05-26 NOTE — Patient Outreach (Signed)
[facility]) Care Management  05/26/2019  Destiny Ballard 11-27-1950 [id number]   Patient was called for follow up on medication assistance. HIPAA identifiers were obtained.    Patient is a 68 year old female with multiple medical conditions including but not limited to:  CAD, CHF, COPD, depression with anxiety, hypertension, hypokalemia, and type 2 diabetes. Patient visited the ED last month for complications secondary to a fall.  She reported feeling better today and was without complaint.    Patient confirmed receiving Spiriva and Farxiga from patient assistance programs.  Due to her insurance, she was not eligible for [employer] and she declined completing the attestation form for [employer]'s program.  Patient's most recent HgA1c was 9.9%. Hopefully this will decrease since she has received [place]. She is currently working with [place], Chaya Jan, RN.  Plan: Close patient's pharmacy case. Will gladly reopen upon request.  Elayne Guerin, PharmD, [facility] Clinical Pharmacist [phone]

## 2019-06-04 ENCOUNTER — Other Ambulatory Visit: Payer: Self-pay | Admitting: *Deleted

## 2019-06-04 NOTE — Patient Outreach (Signed)
[facility]) Care Management  06/04/2019  Destiny Ballard 1951/06/04 [id number]   RN Health CoachMonthlyOutreach  Referral Date:12/25/2017 Referral Source:Transfer from [facility] Reason for Referral:Continued Disease Management Education Insurance:Medicare   Outreach Attempt:  Outreach attempt #1 to patient for follow up. No answer. RN Health Coach left HIPAA compliant voicemail message along with contact information.  Plan:  RN Health Coach will make another outreach attempt to patient within the month of December if no return call back from patient.  [name] [phone] [name]

## 2019-06-28 ENCOUNTER — Other Ambulatory Visit: Payer: Self-pay | Admitting: *Deleted

## 2019-06-28 NOTE — Patient Outreach (Signed)
[facility]) Care Management  06/28/2019  NEKO BRANDSTETTER August 26, 1950 [id number]   RN Health CoachMonthlyOutreach  Referral Date:12/25/2017 Referral Source:Transfer from [place] Reason for Referral:Continued Disease Management Education Insurance:Medicare   Outreach Attempt:  Outreach attempt #2 to patient for follow up. No answer. RN Health Coach left HIPAA compliant voicemail message along with contact information.  Plan:  RN Health Coach will make another outreach attempt within the month of January if no return call back from patient.  [place] [phone] [email or web address]

## 2019-06-30 ENCOUNTER — Telehealth: Payer: Self-pay | Admitting: Family Medicine

## 2019-06-30 ENCOUNTER — Ambulatory Visit: Payer: Self-pay

## 2019-06-30 NOTE — Telephone Encounter (Signed)
Patient called stating that she has had a positive exposure to COVID-19.  She states that she was in her daughters home Saturday for several hours and her boyfriend was in another room sick. He has since been admitted to the hospital with COVID-19.  She states that she was also with her daughter on Sunday for several hours. Her daughter is being tested today.  She has no symptoms. Ms Laslie states that she has a headache and cough.  She has COPD, diabetes, a heart stent. She is unsure if her cough is her normal cough or might be a COVID-19 symptom. She has no fever. Care advice read to patient.  She was given information on scheduling for COVID-19 testing at our sites.She was instructed that she should quarantine 14 days and monitor for symptoms.  Call was transfered to office for advice or scheduling.  Reason for Disposition . [1] CLOSE CONTACT COVID-19 EXPOSURE within last 14 days AND [2] NO symptoms  Answer Assessment - Initial Assessment Questions 1. COVID-19 CLOSE CONTACT: "Who is the person with the confirmed or suspected COVID-19 infection that you were exposed to?"     Visiting with daughter boyfriend now sick 2. PLACE of CONTACT: "Where were you when you were exposed to COVID-19?" (e.g., home, school, medical waiting room; which city?)     home 3. TYPE of CONTACT: "How much contact was there?" (e.g., sitting next to, live in same house, work in same office, same building)    2 hours and then yesterday 4. DURATION of CONTACT: "How long were you in contact with the COVID-19 patient?" (e.g., a few seconds, passed by person, a few minutes, 15 minutes or longer, live with the patient)     5 hours on Sunday 5. MASK: "Were you wearing a mask?" "Was the other person wearing a mask?" Note: wearing a mask reduces the risk of an  otherwise close contact.     no 6. DATE of CONTACT: "When did you have contact with a COVID-19 patient?" (e.g., how many days ago)     Sunday 13th 7. COMMUNITY SPREAD:  "Are there lots of cases of COVID-19 (community spread) where you live?" (See public health department website, if unsure)      Large spread  8. SYMPTOMS: "Do you have any symptoms?" (e.g., fever, cough, breathing difficulty, loss of taste or smell)     Headache,cough from COPD 9. PREGNANCY OR POSTPARTUM: "Is there any chance you are pregnant?" "When was your last menstrual period?" "Did you deliver in the last 2 weeks?"     N/A 10. HIGH RISK: "Do you have any heart or lung problems? Do you have a weak immune system?" (e.g., heart failure, COPD, asthma, HIV positive, chemotherapy, renal failure, diabetes mellitus, sickle cell anemia, obesity)      COPD diabetes, heart stent 11.  TRAVEL: "Have you traveled out of the country recently?" If so, "When and where?"  Also ask about out-of-state travel, since the CDC has identified some high-risk cities for community spread in the [place].  Note: Travel becomes less relevant if there is widespread community transmission where the patient lives.       No  Protocols used: CORONAVIRUS (COVID-19) EXPOSURE-A-AH

## 2019-06-30 NOTE — Telephone Encounter (Signed)
Dr Lorelei Pont -- please advise if you have further recommendations.

## 2019-07-03 ENCOUNTER — Other Ambulatory Visit: Payer: Self-pay | Admitting: Family Medicine

## 2019-07-03 DIAGNOSIS — L299 Pruritus, unspecified: Secondary | ICD-10-CM

## 2019-07-21 ENCOUNTER — Other Ambulatory Visit: Payer: Self-pay | Admitting: Family Medicine

## 2019-07-21 DIAGNOSIS — F5102 Adjustment insomnia: Secondary | ICD-10-CM

## 2019-07-25 ENCOUNTER — Other Ambulatory Visit: Payer: Self-pay | Admitting: Family Medicine

## 2019-07-25 DIAGNOSIS — IMO0002 Reserved for concepts with insufficient information to code with codable children: Secondary | ICD-10-CM

## 2019-07-25 DIAGNOSIS — E118 Type 2 diabetes mellitus with unspecified complications: Secondary | ICD-10-CM

## 2019-07-25 DIAGNOSIS — E1165 Type 2 diabetes mellitus with hyperglycemia: Secondary | ICD-10-CM

## 2019-07-28 ENCOUNTER — Other Ambulatory Visit: Payer: Self-pay | Admitting: Nurse Practitioner

## 2019-07-28 DIAGNOSIS — R600 Localized edema: Secondary | ICD-10-CM

## 2019-08-02 ENCOUNTER — Other Ambulatory Visit: Payer: Self-pay | Admitting: Family Medicine

## 2019-08-02 DIAGNOSIS — M545 Low back pain: Secondary | ICD-10-CM

## 2019-08-02 DIAGNOSIS — M79605 Pain in left leg: Secondary | ICD-10-CM

## 2019-08-06 ENCOUNTER — Other Ambulatory Visit: Payer: Self-pay | Admitting: *Deleted

## 2019-08-06 ENCOUNTER — Encounter: Payer: Self-pay | Admitting: *Deleted

## 2019-08-06 NOTE — Patient Outreach (Signed)
[facility]) Care Management  [facility] Care Manager  08/06/2019   Destiny Ballard [birth year] [id number]   [place] Quarterly Outreach   Referral Date:  12/25/2017 Referral Source:  Transfer from [place] Reason for Referral:  Continued Disease Management Education Insurance:  Medicare    Outreach Attempt:  Successful telephone outreach to patient for follow up.  HIPAA verified with patient.  Patient reporting she has not been feeling well.  Has been having chills and increased cough that her normal cough prescription is not helping and headache.  Denies fever but wants to get COVID tested.  Encouraged patient to discuss symptoms with primary care provider, discussed signing up for COVID testing online, and offered to assist patient with signing up for COVID testing online (patient declined assistance and stated she would contact her primary care provider).  Reports shortness of breath is about the same with exertion and states she uses her rescue inhaler about once every other day.  States she has been monitoring blood sugars a couple of times a week.  Last fasting blood sugar was 181 with recent ranges of 200's.  Patient stating she only eats about once a day.  Discussed importance of eating at least 3 well balanced meals a day.  Encouraged patient to monitor blood sugars at least daily and to notify provider for sustained elevations.  Admits she needs to make better food choices and patient encourage to do so.  Does state she continues to be depressed from the passing of her husband 2 years ago.  Offered [facility] Social Work referral for counseling and depression resources, patient declines at this time.  Encounter Medications:  Outpatient Encounter Medications as of 08/06/2019  Medication Sig  . albuterol (PROAIR HFA) 108 (90 Base) MCG/ACT inhaler Inhale 2 puffs into the lungs every 4 (four) hours as needed for wheezing or shortness of breath.  [name] aspirin EC 81 MG  tablet Take 81 mg by mouth daily.  . benzonatate (TESSALON) 200 MG capsule TAKE 1 CAPSULE BY MOUTH 3 TIMES A DAY AS NEEDED FOR COUGH  . calcium carbonate (TUMS - DOSED IN MG ELEMENTAL CALCIUM) 500 MG chewable tablet Chew 2 tablets by mouth daily as needed for indigestion or heartburn.  . cetirizine (ZYRTEC) 10 MG tablet TAKE 1 TABLET BY MOUTH DAILY AS NEEDED FOR ALLERGIES  . Cholecalciferol (VITAMIN D3) 1000 UNITS CAPS Take 1,000 Units by mouth daily.   . clonazePAM (KLONOPIN) 0.5 MG tablet TAKE 0.5 TABLETS (0.25 MG TOTAL) BY MOUTH 2 TIMES DAILY AS NEEDED FOR ANXIETY  . dapagliflozin propanediol (FARXIGA) 10 MG TABS tablet Take 10 mg by mouth daily.  [name] FLUoxetine (PROZAC) 40 MG capsule Take 1 capsule (40 mg total) by mouth daily.  . fluticasone (FLONASE) 50 MCG/ACT nasal spray SPRAY 2 SPRAYS INTO EACH NOSTRIL EVERY DAY  . furosemide (LASIX) 20 MG tablet TAKE 1 TABLET BY MOUTH EVERY DAY  . gabapentin (NEURONTIN) 300 MG capsule TAKE 2 CAPSULES BY MOUTH TWICE A DAY  . glipiZIDE (GLUCOTROL) 5 MG tablet TAKE 1 TABLET (5 MG TOTAL) BY MOUTH DAILY BEFORE BREAKFAST.  . hydrALAZINE (APRESOLINE) 25 MG tablet TAKE 2 TABLETS (50 MG TOTAL) BY MOUTH 3 (THREE) TIMES DAILY.  . hydrOXYzine (ATARAX/VISTARIL) 25 MG tablet TAKE 1/2 TO 1 TABLET BY MOUTH EVERY 8 HOURS AS NEEDED FOR ITCHING  . ibuprofen (ADVIL) 600 MG tablet Take 600 mg by mouth every 8 (eight) hours as needed. for pain  . LEVEMIR FLEXTOUCH 100 UNIT/ML  Pen INJECT 28 UNITS INTO THE SKIN DAILY.-- 53 DAY SUPPLY  . losartan (COZAAR) 100 MG tablet Take 1 tablet (100 mg total) by mouth daily.  . metFORMIN (GLUCOPHAGE-XR) 500 MG 24 hr tablet Take 2 tablets (1,000 mg total) by mouth 2 (two) times daily.  . methocarbamol (ROBAXIN) 750 MG tablet TAKE 1 TABLET (750 MG TOTAL) BY MOUTH EVERY 12 (TWELVE) HOURS AS NEEDED FOR MUSCLE SPASMS.  . metoprolol succinate (TOPROL-XL) 100 MG 24 hr tablet TAKE 1 TABLET BY MOUTH EVERY DAY  . montelukast (SINGULAIR) 10 MG tablet  TAKE 1 TABLET BY MOUTH EVERY DAY AS NEEDED FOR ALLERGIES  . omega-3 acid ethyl esters (LOVAZA) 1 g capsule TAKE 1 CAPSULE (1 G TOTAL) BY MOUTH 2 (TWO) TIMES DAILY.  . Polyvinyl Alcohol-Povidone (REFRESH OP) Place 2 drops into both eyes daily as needed (for dry eyes).  . potassium chloride (KLOR-CON 10) 10 MEQ tablet Take 1 tablet (10 mEq total) by mouth daily.  . rosuvastatin (CRESTOR) 20 MG tablet Take 1 tablet (20 mg total) by mouth daily.  . Tiotropium Bromide Monohydrate (SPIRIVA RESPIMAT) 2.5 MCG/ACT AERS Inhale 2 puffs into the lungs daily.  . traZODone (DESYREL) 50 MG tablet TAKE 1/2 TO 1 TABLET BY MOUTH AT BEDTIME AS NEEDED FOR SLEEP  . nitroGLYCERIN (NITROSTAT) 0.4 MG SL tablet Place 1 tablet (0.4 mg total) under the tongue every 5 (five) minutes as needed for chest pain.  [name] ondansetron (ZOFRAN ODT) 4 MG disintegrating tablet 45m ODT q4 hours prn nausea/vomit  . oxyCODONE-acetaminophen (PERCOCET/ROXICET) 5-325 MG tablet Take 1 tablet by mouth every 6 (six) hours as needed for severe pain.  . polyethylene glycol (MIRALAX / GLYCOLAX) packet Take 17 g by mouth daily.  .Marland Kitchentriamcinolone cream (KENALOG) 0.1 % Apply 1 application topically 2 (two) times daily. Use once or twice a day for poison ivy- apply sparingly to face.  Use no longer than 1 week   No facility-administered encounter medications on file as of 08/06/2019.    Functional Status:  In your present state of health, do you have any difficulty performing the following activities: 12/29/2018  Hearing? N  Vision? N  Difficulty concentrating or making decisions? N  Walking or climbing stairs? Y  Comment trouble walking due to hip pain  Dressing or bathing? N  Doing errands, shopping? N  Preparing Food and eating ? N  Using the Toilet? N  In the past six months, have you accidently leaked urine? Y  Comment wears depends  Do you have problems with loss of bowel control? Y  Comment wears depends  Managing your Medications? N   Managing your Finances? N  Housekeeping or managing your Housekeeping? N  Some recent data might be hidden    Fall/Depression Screening: Fall Risk  08/06/2019 05/07/2019 12/29/2018  Falls in the past year? 1 1 0  Comment last fall 04/2019 Fall in 10/20 -  Number falls in past yr: 1 0 -  Injury with Fall? 1 1 -  Comment - Left rib fracture -  Risk for fall due to : History of fall(s);Medication side effect;Impaired balance/gait;Impaired mobility History of fall(s);Impaired balance/gait;Impaired mobility;Impaired vision;Medication side effect Medication side effect;Impaired balance/gait;Impaired mobility  Follow up Falls prevention discussed;Education provided;Falls evaluation completed Falls evaluation completed;Education provided;Falls prevention discussed Falls evaluation completed;Education provided;Falls prevention discussed   PHQ 2/9 Scores 12/29/2018 02/03/2018 10/24/2017 09/01/2017 08/27/2016 09/20/2015  PHQ - 2 Score _0 0  PHQ- 9 Score _1 -  Exception Documentation - - - - - Patient refusal   THN CM Care Plan Problem One     Most Recent Value  Care Plan Problem One  Knowledge deficiet of COPD diagnosis and self care management of diabetes  Role Documenting the Problem One  Health [place] for Problem One  Active  THN Long Term Goal   Patient will report a decrease in A1C by 0.3 points in the next 90 days.  THN Long Term Goal Start Date  08/06/19  Interventions for Problem One Long Term Goal  Care plan and goals reviewed and discussed, reviewed medications and indications and encouraged medication compliance, discussed importance of eating at least 3 well balanced meals a day, encouraged patient to choose healthier diabetic meal and drink options, offered [place] Work referal for depression resources and counseling (patient declines at this time), encouraged patient to check blood sugars at least once a day and to document or take meter to appointment for provider  review, encouraged patient to contact provider for sustained elevations, reviewed current Hgb A1C and blood sugar ranges and discussed ways to help reduce  THN CM Short Term Goal #1   Patient will report no unplanned hospitalizations or emergency room visits in the next 60 days.  THN CM Short Term Goal #1 Start Date  08/06/19  [facility] CM Short Term Goal #1 Met Date  08/06/19  Interventions for Short Term Goal #1  Encouraged patient to discuss cough and ?COVID testing with primary care provider, discussed with patient COVID testing appointments could be made online, offered to assist patient with signing up for COVID screening online (patient declines), encouraged to continue to practice 3 W's, encouraged patient to call and schedule primary care follow up, discussed importance of staying hydrated and good nutrition, encouraged patient to discuss urinary incontinence and urgency with primary care provider  [facility] CM Short Term Goal #2         Appointments:  Attended appointment with primary care provider, Dr. Lorelei Pont on 05/19/2019 and needs to schedule follow up appointment.  Patient encouraged to do so.  Plan: RN Health Coach will send primary care provider quarterly update. RN Health Coach will make next telephone outreach to patient within the month of March and patient agrees to future outreach.  [place] [phone] [email or web address]

## 2019-08-06 NOTE — Patient Outreach (Signed)
[facility]) Care Management  08/06/2019  Destiny Ballard June 14, 1951 [id number]   RN Health CoachMonthlyOutreach  Referral Date:12/25/2017 Referral Source:Transfer from [place] Reason for Referral:Continued Disease Management Education Insurance:Medicare   Outreach Attempt:  Outreach attempt #3 to patient for follow up. No answer. RN Health Coach left HIPAA compliant voicemail message along with contact information.  Plan:  RN Health Coach will make another outreach attempt within the month of February if no return call back from patient.  [place] [phone] [email or web address]

## 2019-08-07 NOTE — Progress Notes (Signed)
[place] at [facility] [address] [place], [place] [address] [phone]  Date:  08/09/2019   Name:  Destiny Ballard   DOB:  1951/02/21   MRN:  [id number]  PCP:  Darreld Mclean, MD    Chief Complaint: No chief complaint on file.   History of Present Illness:  Destiny Ballard is a 69 y.o. very pleasant female patient who presents with the following:  Virtual follow-up visit today for concern of urinary symptoms Patient location is home, provider location is office.  Patient identity is current with 2 factors, she gives consent for virtual visit today.  The patient and myself are present on today's phone call History of CAD, CHF, COPD, HTN, DM  Lab Results  Component Value Date   HGBA1C 9.9 (H) 04/29/2019   Her brother was recently dx with covid - he is admitted to the hospital in [place]; he is also on dialysis She was around him on 1/4-he was dx with covid much more recently  Today Destiny Ballard is worried about swelling in her left leg only-this is not necessarily new, but is recurrent Also, she has noted urinary urgency; she will sometimes not make it to the bathroom in time to void She has noted this for a month or so No blood in her urine No dysuria  No fever noted Her appetite is ok     Patient Active Problem List   Diagnosis Date Noted  . Obesity (BMI 30-39.9) 08/24/2018  . Depression with anxiety 08/24/2018  . CAD (coronary artery disease) 08/24/2018  . Abnormal cardiovascular stress test 08/24/2018  . Lobar pneumonia, unspecified organism ([place]) 11/13/2017  . Precordial chest pain   . Sepsis ([place]) 09/18/2017  . Chronic diastolic CHF (congestive heart failure) ([place]) 09/18/2017  . Left hip pain 10/28/2016  . Chronic rhinitis 08/07/2016  . COPD with acute exacerbation ([place]) 04/25/2016  . Essential hypertension 01/30/2016  . Hypokalemia 01/27/2016  . HLD (hyperlipidemia)   . Type 2 diabetes  mellitus without complication, without long-term current use of insulin ([place])   . OSA (obstructive sleep apnea) 05/06/2014  . Dyspnea on exertion 09/02/2012  . Chest pain 12/10/2011  . Chronic cough 05/11/2010  . GERD 06/28/2008    Past Medical History:  Diagnosis Date  . Anxiety    Prior suicide attempt  . CAD (coronary artery disease)    a) s/p DES to LAD 07/2005 b) Last Myoview low risk 11/2011 showing small fixed apical perfusion defect (prior MI vs attenuation) but no ischemia - normal EF.  [name] Cervical spondylosis   . Coronary atherosclerosis 06/28/2008  . Depression   . Depression with anxiety   . Diabetes mellitus without complication ([place])   . GERD (gastroesophageal reflux disease)   . Hyperlipidemia   . Hypertension   . Insulin resistance   . Iron deficiency anemia   . Obesity     Past Surgical History:  Procedure Laterality Date  . BREAST ENHANCEMENT SURGERY    . CARDIAC CATHETERIZATION  06/17/2007   NORMAL. EF 60%  . CARDIAC CATHETERIZATION N/A 01/29/2016   Procedure: Left Heart Cath and Coronary Angiography;  Surgeon: Sherren Mocha, MD;  Location: [place] [facility];  Service: Cardiovascular;  Laterality: N/A;  . CERVICAL SPONDYLOSIS     SINGLE LEVEL FUSION  . CHILDBIRTH     X3  . CORONARY STENT PLACEMENT  07/2005   LEFT ANTERIOR DESCENDING  . FOREARM FRACTURE SURGERY  2010   hand and shoulder   . INCISION AND DRAINAGE BREAST ABSCESS  01/05/2012      . INCISION AND DRAINAGE PERIRECTAL ABSCESS N/A 02/18/2014   Procedure: IRRIGATION AND DEBRIDEMENT PERIRECTAL ABSCESS;  Surgeon: Pedro Earls, MD;  Location: [facility];  Service: General;  Laterality: N/A;  . LEFT HEART CATH AND CORONARY ANGIOGRAPHY N/A 10/10/2017   Procedure: LEFT HEART CATH AND CORONARY ANGIOGRAPHY;  Surgeon: Burnell Blanks, MD;  Location: [place] [facility];  Service: Cardiovascular;  Laterality: N/A;  . LUMBAR LAMINECTOMY    . ROTATOR CUFF REPAIR     bil[place]la  . TONSILLECTOMY AND  ADENOIDECTOMY    . TUBAL LIGATION    . VIDEO BRONCHOSCOPY Bilateral 08/13/2016   Procedure: VIDEO BRONCHOSCOPY WITHOUT FLUORO;  Surgeon: Collene Gobble, MD;  Location: [facility];  Service: Cardiopulmonary;  Laterality: Bilateral;    Social History   Tobacco Use  . Smoking status: Passive Smoke Exposure - Never Smoker  . Smokeless tobacco: Never Used  Substance Use Topics  . Alcohol use: Yes    Comment: occ  . Drug use: No    Family History  Problem Relation Age of Onset  . Heart attack Mother   . Diabetes Mother   . Lung cancer Mother   . Asthma Mother   . Heart disease Mother   . Suicidality Father        "killed himself"  . Asthma Daughter        x 2  . Cancer Daughter        pre-cancerous polyp  . Diabetes Sister   . Cancer Sister   . Cervical cancer Daughter        cervical   . Allergies Other        all family--seasonal allergies    Allergies  Allergen Reactions  . Prednisone Other (See Comments)    REACTION: mood swings, nightmares. "Shot doesn't bother me, reaction is just with the pill" she states she has had the steroid injections before. From our records methylprednisone was given to her in 2013 without any complications.    Medication list has been reviewed and updated.  Current Outpatient Medications on File Prior to Visit  Medication Sig Dispense Refill  . albuterol (PROAIR HFA) 108 (90 Base) MCG/ACT inhaler Inhale 2 puffs into the lungs every 4 (four) hours as needed for wheezing or shortness of breath. 3 Inhaler 1  . aspirin EC 81 MG tablet Take 81 mg by mouth daily.    . benzonatate (TESSALON) 200 MG capsule TAKE 1 CAPSULE BY MOUTH 3 TIMES A DAY AS NEEDED FOR COUGH 90 capsule 5  . calcium carbonate (TUMS - DOSED IN MG ELEMENTAL CALCIUM) 500 MG chewable tablet Chew 2 tablets by mouth daily as needed for indigestion or heartburn.    . cetirizine (ZYRTEC) 10 MG tablet TAKE 1 TABLET BY MOUTH DAILY AS NEEDED FOR ALLERGIES 90 tablet 1  .  Cholecalciferol (VITAMIN D3) 1000 UNITS CAPS Take 1,000 Units by mouth daily.     . clonazePAM (KLONOPIN) 0.5 MG tablet TAKE 0.5 TABLETS (0.25 MG TOTAL) BY MOUTH 2 TIMES DAILY AS NEEDED FOR ANXIETY 20 tablet 1  . dapagliflozin propanediol (FARXIGA) 10 MG TABS tablet Take 10 mg by mouth daily. 90 tablet 3  . FLUoxetine (PROZAC) 40 MG capsule Take 1 capsule (40 mg total) by mouth daily. 90 capsule 3  . fluticasone (FLONASE) 50 MCG/ACT nasal spray SPRAY 2 SPRAYS INTO EACH NOSTRIL EVERY DAY 48  g 0  . furosemide (LASIX) 20 MG tablet TAKE 1 TABLET BY MOUTH EVERY DAY 90 tablet 2  . gabapentin (NEURONTIN) 300 MG capsule TAKE 2 CAPSULES BY MOUTH TWICE A DAY 360 capsule 1  . glipiZIDE (GLUCOTROL) 5 MG tablet TAKE 1 TABLET (5 MG TOTAL) BY MOUTH DAILY BEFORE BREAKFAST. 90 tablet 1  . hydrALAZINE (APRESOLINE) 25 MG tablet TAKE 2 TABLETS (50 MG TOTAL) BY MOUTH 3 (THREE) TIMES DAILY. 540 tablet 2  . hydrOXYzine (ATARAX/VISTARIL) 25 MG tablet TAKE 1/2 TO 1 TABLET BY MOUTH EVERY 8 HOURS AS NEEDED FOR ITCHING 270 tablet 0  . ibuprofen (ADVIL) 600 MG tablet Take 600 mg by mouth every 8 (eight) hours as needed. for pain    . LEVEMIR FLEXTOUCH 100 UNIT/ML Pen INJECT 28 UNITS INTO THE SKIN DAILY.-- 53 DAY SUPPLY 15 mL 1  . losartan (COZAAR) 100 MG tablet Take 1 tablet (100 mg total) by mouth daily. 90 tablet 3  . metFORMIN (GLUCOPHAGE-XR) 500 MG 24 hr tablet Take 2 tablets (1,000 mg total) by mouth 2 (two) times daily. 360 tablet 1  . methocarbamol (ROBAXIN) 750 MG tablet TAKE 1 TABLET (750 MG TOTAL) BY MOUTH EVERY 12 (TWELVE) HOURS AS NEEDED FOR MUSCLE SPASMS. 60 tablet 3  . metoprolol succinate (TOPROL-XL) 100 MG 24 hr tablet TAKE 1 TABLET BY MOUTH EVERY DAY 90 tablet 3  . montelukast (SINGULAIR) 10 MG tablet TAKE 1 TABLET BY MOUTH EVERY DAY AS NEEDED FOR ALLERGIES 90 tablet 1  . nitroGLYCERIN (NITROSTAT) 0.4 MG SL tablet Place 1 tablet (0.4 mg total) under the tongue every 5 (five) minutes as needed for chest pain. 20  tablet 3  . omega-3 acid ethyl esters (LOVAZA) 1 g capsule TAKE 1 CAPSULE (1 G TOTAL) BY MOUTH 2 (TWO) TIMES DAILY. 180 capsule 1  . ondansetron (ZOFRAN ODT) 4 MG disintegrating tablet 4mg  ODT q4 hours prn nausea/vomit 20 tablet 0  . oxyCODONE-acetaminophen (PERCOCET/ROXICET) 5-325 MG tablet Take 1 tablet by mouth every 6 (six) hours as needed for severe pain. 15 tablet 0  . polyethylene glycol (MIRALAX / GLYCOLAX) packet Take 17 g by mouth daily. 14 each 0  . Polyvinyl Alcohol-Povidone (REFRESH OP) Place 2 drops into both eyes daily as needed (for dry eyes).    . potassium chloride (KLOR-CON 10) 10 MEQ tablet Take 1 tablet (10 mEq total) by mouth daily. 90 tablet 1  . rosuvastatin (CRESTOR) 20 MG tablet Take 1 tablet (20 mg total) by mouth daily. 90 tablet 3  . Tiotropium Bromide Monohydrate (SPIRIVA RESPIMAT) 2.5 MCG/ACT AERS Inhale 2 puffs into the lungs daily. 1 Inhaler 11  . traZODone (DESYREL) 50 MG tablet TAKE 1/2 TO 1 TABLET BY MOUTH AT BEDTIME AS NEEDED FOR SLEEP 90 tablet 1  . triamcinolone cream (KENALOG) 0.1 % Apply 1 application topically 2 (two) times daily. Use once or twice a day for poison ivy- apply sparingly to face.  Use no longer than 1 week 30 g 0   No current facility-administered medications on file prior to visit.    Review of Systems:  As per HPI- otherwise negative.   Physical Examination: There were no vitals filed for this visit. There were no vitals filed for this visit. There is no height or weight on file to calculate BMI. Ideal Body Weight:    Pt viewed over video monitor She is not checking her vitals  She looks well, her normal self No wheezing, cough, distress is noted  Assessment and Plan:  Urinary urgency - Plan: Urine Culture, cephALEXin (KEFLEX) 500 MG capsule  Virtual visit today for concern of urinary urgency.  I have asked the patient to collect a home urine sample for culture, then start Keflex antibiotic.  She is also having some leg  swelling, I suggested that we see her in the office.  She is willing to do so, prefers to be seen on Thursday.  I have scheduled her an appointment  Moderate medical decision making today  Signed Lamar Blinks, MD

## 2019-08-09 ENCOUNTER — Other Ambulatory Visit: Payer: Self-pay

## 2019-08-09 ENCOUNTER — Encounter: Payer: Self-pay | Admitting: Family Medicine

## 2019-08-09 ENCOUNTER — Ambulatory Visit (INDEPENDENT_AMBULATORY_CARE_PROVIDER_SITE_OTHER): Payer: Medicare Other | Admitting: Family Medicine

## 2019-08-09 DIAGNOSIS — R3915 Urgency of urination: Secondary | ICD-10-CM

## 2019-08-09 MED ORDER — CEPHALEXIN 500 MG PO CAPS: 500.0000 mg | ORAL_CAPSULE | Freq: Two times a day (BID) | ORAL | 0 refills | Status: DC

## 2019-08-10 NOTE — Progress Notes (Deleted)
[facility] [address], [place], [place] [address] [phone]  Date:  08/12/2019   Name:  Destiny Ballard   DOB:  Nov 19, 1950   MRN:  [id number]  PCP:  Darreld Mclean, MD    Chief Complaint: No chief complaint on file.   History of Present Illness:  Destiny Ballard is a 69 y.o. very pleasant female patient who presents with the following:  Here today for in person follow-up visit History of CAD, CHF, COPD, hypertension, diabetes We had a virtual visit earlier this week, at which time patient had complained of possible UTI I asked her to collect at home urine culture, and started on Keflex I asked her to come in due to concern of swelling in her left leg She also fell in mid October, broke one of her ribs and was using oxycodone for pain for a while  Lab Results  Component Value Date   HGBA1C 9.9 (H) 04/29/2019   Eye exam A1c is due We increased glipizide to twice daily after her most recent elevated A1c  Patient Active Problem List   Diagnosis Date Noted  . Obesity (BMI 30-39.9) 08/24/2018  . Depression with anxiety 08/24/2018  . CAD (coronary artery disease) 08/24/2018  . Abnormal cardiovascular stress test 08/24/2018  . Lobar pneumonia, unspecified organism ([place]) 11/13/2017  . Precordial chest pain   . Sepsis ([place]) 09/18/2017  . Chronic diastolic CHF (congestive heart failure) ([place]) 09/18/2017  . Left hip pain 10/28/2016  . Chronic rhinitis 08/07/2016  . COPD with acute exacerbation ([place]) 04/25/2016  . Essential hypertension 01/30/2016  . Hypokalemia 01/27/2016  . HLD (hyperlipidemia)   . Type 2 diabetes mellitus without complication, without long-term current use of insulin ([place])   . OSA (obstructive sleep apnea) 05/06/2014  . Dyspnea on exertion 09/02/2012  . Chest pain 12/10/2011  . Chronic cough 05/11/2010  . GERD 06/28/2008    Past Medical History:  Diagnosis Date  . Anxiety    Prior suicide attempt  . CAD (coronary artery disease)    a) s/p DES to LAD 07/2005 b) Last Myoview low risk 11/2011 showing small fixed apical perfusion defect (prior MI vs attenuation) but no ischemia - normal EF.  [name] Cervical spondylosis   . Coronary atherosclerosis 06/28/2008  . Depression   . Depression with anxiety   . Diabetes mellitus without complication ([place])   . GERD (gastroesophageal reflux disease)   . Hyperlipidemia   . Hypertension   . Insulin resistance   . Iron deficiency anemia   . Obesity     Past Surgical History:  Procedure Laterality Date  . BREAST ENHANCEMENT SURGERY    . CARDIAC CATHETERIZATION  06/17/2007   NORMAL. EF 60%  . CARDIAC CATHETERIZATION N/A 01/29/2016   Procedure: Left Heart Cath and Coronary Angiography;  Surgeon: Sherren Mocha, MD;  Location: [facility];  Service: Cardiovascular;  Laterality: N/A;  . CERVICAL SPONDYLOSIS     SINGLE LEVEL FUSION  . CHILDBIRTH     X3  . CORONARY STENT PLACEMENT  07/2005   LEFT ANTERIOR DESCENDING  . FOREARM FRACTURE SURGERY  2010   hand and shoulder   . INCISION AND DRAINAGE BREAST ABSCESS  01/05/2012      . INCISION AND DRAINAGE PERIRECTAL ABSCESS N/A 02/18/2014   Procedure: IRRIGATION AND DEBRIDEMENT PERIRECTAL ABSCESS;  Surgeon: Pedro Earls, MD;  Location: [facility];  Service: General;  Laterality: N/A;  .  LEFT HEART CATH AND CORONARY ANGIOGRAPHY N/A 10/10/2017   Procedure: LEFT HEART CATH AND CORONARY ANGIOGRAPHY;  Surgeon: Burnell Blanks, MD;  Location: [facility];  Service: Cardiovascular;  Laterality: N/A;  . LUMBAR LAMINECTOMY    . ROTATOR CUFF REPAIR     bilaterla  . TONSILLECTOMY AND ADENOIDECTOMY    . TUBAL LIGATION    . VIDEO BRONCHOSCOPY Bilateral 08/13/2016   Procedure: VIDEO BRONCHOSCOPY WITHOUT FLUORO;  Surgeon: Collene Gobble, MD;  Location: [facility];  Service: Cardiopulmonary;  Laterality: Bilateral;    Social History   Tobacco Use  . Smoking status: Passive  Smoke Exposure - Never Smoker  . Smokeless tobacco: Never Used  Substance Use Topics  . Alcohol use: Yes    Comment: occ  . Drug use: No    Family History  Problem Relation Age of Onset  . Heart attack Mother   . Diabetes Mother   . Lung cancer Mother   . Asthma Mother   . Heart disease Mother   . Suicidality Father        "killed himself"  . Asthma Daughter        x 2  . Cancer Daughter        pre-cancerous polyp  . Diabetes Sister   . Cancer Sister   . Cervical cancer Daughter        cervical   . Allergies Other        all family--seasonal allergies    Allergies  Allergen Reactions  . Prednisone Other (See Comments)    REACTION: mood swings, nightmares. "Shot doesn't bother me, reaction is just with the pill" she states she has had the steroid injections before. From our records methylprednisone was given to her in 2013 without any complications.    Medication list has been reviewed and updated.  Current Outpatient Medications on File Prior to Visit  Medication Sig Dispense Refill  . albuterol (PROAIR HFA) 108 (90 Base) MCG/ACT inhaler Inhale 2 puffs into the lungs every 4 (four) hours as needed for wheezing or shortness of breath. 3 Inhaler 1  . aspirin EC 81 MG tablet Take 81 mg by mouth daily.    . benzonatate (TESSALON) 200 MG capsule TAKE 1 CAPSULE BY MOUTH 3 TIMES A DAY AS NEEDED FOR COUGH 90 capsule 5  . calcium carbonate (TUMS - DOSED IN MG ELEMENTAL CALCIUM) 500 MG chewable tablet Chew 2 tablets by mouth daily as needed for indigestion or heartburn.    . cephALEXin (KEFLEX) 500 MG capsule Take 1 capsule (500 mg total) by mouth 2 (two) times daily. 14 capsule 0  . cetirizine (ZYRTEC) 10 MG tablet TAKE 1 TABLET BY MOUTH DAILY AS NEEDED FOR ALLERGIES 90 tablet 1  . Cholecalciferol (VITAMIN D3) 1000 UNITS CAPS Take 1,000 Units by mouth daily.     . clonazePAM (KLONOPIN) 0.5 MG tablet TAKE 0.5 TABLETS (0.25 MG TOTAL) BY MOUTH 2 TIMES DAILY AS NEEDED FOR ANXIETY 20  tablet 1  . dapagliflozin propanediol (FARXIGA) 10 MG TABS tablet Take 10 mg by mouth daily. 90 tablet 3  . FLUoxetine (PROZAC) 40 MG capsule Take 1 capsule (40 mg total) by mouth daily. 90 capsule 3  . fluticasone (FLONASE) 50 MCG/ACT nasal spray SPRAY 2 SPRAYS INTO EACH NOSTRIL EVERY DAY 48 g 0  . furosemide (LASIX) 20 MG tablet TAKE 1 TABLET BY MOUTH EVERY DAY 90 tablet 2  . gabapentin (NEURONTIN) 300 MG capsule TAKE 2 CAPSULES BY MOUTH TWICE A  DAY 360 capsule 1  . glipiZIDE (GLUCOTROL) 5 MG tablet TAKE 1 TABLET (5 MG TOTAL) BY MOUTH DAILY BEFORE BREAKFAST. 90 tablet 1  . hydrALAZINE (APRESOLINE) 25 MG tablet TAKE 2 TABLETS (50 MG TOTAL) BY MOUTH 3 (THREE) TIMES DAILY. 540 tablet 2  . hydrOXYzine (ATARAX/VISTARIL) 25 MG tablet TAKE 1/2 TO 1 TABLET BY MOUTH EVERY 8 HOURS AS NEEDED FOR ITCHING 270 tablet 0  . ibuprofen (ADVIL) 600 MG tablet Take 600 mg by mouth every 8 (eight) hours as needed. for pain    . LEVEMIR FLEXTOUCH 100 UNIT/ML Pen INJECT 28 UNITS INTO THE SKIN DAILY.-- 53 DAY SUPPLY 15 mL 1  . losartan (COZAAR) 100 MG tablet Take 1 tablet (100 mg total) by mouth daily. 90 tablet 3  . metFORMIN (GLUCOPHAGE-XR) 500 MG 24 hr tablet Take 2 tablets (1,000 mg total) by mouth 2 (two) times daily. 360 tablet 1  . methocarbamol (ROBAXIN) 750 MG tablet TAKE 1 TABLET (750 MG TOTAL) BY MOUTH EVERY 12 (TWELVE) HOURS AS NEEDED FOR MUSCLE SPASMS. 60 tablet 3  . metoprolol succinate (TOPROL-XL) 100 MG 24 hr tablet TAKE 1 TABLET BY MOUTH EVERY DAY 90 tablet 3  . montelukast (SINGULAIR) 10 MG tablet TAKE 1 TABLET BY MOUTH EVERY DAY AS NEEDED FOR ALLERGIES 90 tablet 1  . nitroGLYCERIN (NITROSTAT) 0.4 MG SL tablet Place 1 tablet (0.4 mg total) under the tongue every 5 (five) minutes as needed for chest pain. 20 tablet 3  . omega-3 acid ethyl esters (LOVAZA) 1 g capsule TAKE 1 CAPSULE (1 G TOTAL) BY MOUTH 2 (TWO) TIMES DAILY. 180 capsule 1  . ondansetron (ZOFRAN ODT) 4 MG disintegrating tablet 4mg  ODT q4  hours prn nausea/vomit 20 tablet 0  . oxyCODONE-acetaminophen (PERCOCET/ROXICET) 5-325 MG tablet Take 1 tablet by mouth every 6 (six) hours as needed for severe pain. 15 tablet 0  . polyethylene glycol (MIRALAX / GLYCOLAX) packet Take 17 g by mouth daily. 14 each 0  . Polyvinyl Alcohol-Povidone (REFRESH OP) Place 2 drops into both eyes daily as needed (for dry eyes).    . potassium chloride (KLOR-CON 10) 10 MEQ tablet Take 1 tablet (10 mEq total) by mouth daily. 90 tablet 1  . rosuvastatin (CRESTOR) 20 MG tablet Take 1 tablet (20 mg total) by mouth daily. 90 tablet 3  . Tiotropium Bromide Monohydrate (SPIRIVA RESPIMAT) 2.5 MCG/ACT AERS Inhale 2 puffs into the lungs daily. 1 Inhaler 11  . traZODone (DESYREL) 50 MG tablet TAKE 1/2 TO 1 TABLET BY MOUTH AT BEDTIME AS NEEDED FOR SLEEP 90 tablet 1  . triamcinolone cream (KENALOG) 0.1 % Apply 1 application topically 2 (two) times daily. Use once or twice a day for poison ivy- apply sparingly to face.  Use no longer than 1 week 30 g 0   No current facility-administered medications on file prior to visit.    Review of Systems:  As per HPI- otherwise negative.   Physical Examination: There were no vitals filed for this visit. There were no vitals filed for this visit. There is no height or weight on file to calculate BMI. Ideal Body Weight:    GEN: WDWN, NAD, Non-toxic, A & O x 3 HEENT: Atraumatic, Normocephalic. Neck supple. No masses, No LAD. Ears and Nose: No external deformity. CV: RRR, No M/G/R. No JVD. No thrill. No extra heart sounds. PULM: CTA B, no wheezes, crackles, rhonchi. No retractions. No resp. distress. No accessory muscle use. ABD: S, NT, ND, +BS. No rebound. No HSM.  EXTR: No c/c/e NEURO Normal gait.  PSYCH: Normally interactive. Conversant. Not depressed or anxious appearing.  Calm demeanor.    Assessment and Plan: *** This visit occurred during the SARS-CoV-2 public health emergency.  Safety protocols were in place,  including screening questions prior to the visit, additional usage of staff PPE, and extensive cleaning of exam room while observing appropriate contact time as indicated for disinfecting solutions.    Signed Lamar Blinks, MD

## 2019-08-11 IMAGING — CR DG HIP (WITH OR WITHOUT PELVIS) 2-3V RIGHT
1 series · 1 of 1 positions shown · non-contrast
Comparison: None.

CLINICAL DATA: Right hip pain

EXAM:
DG HIP (WITH OR WITHOUT PELVIS) 2-3V RIGHT

[t pelvis a.p.]
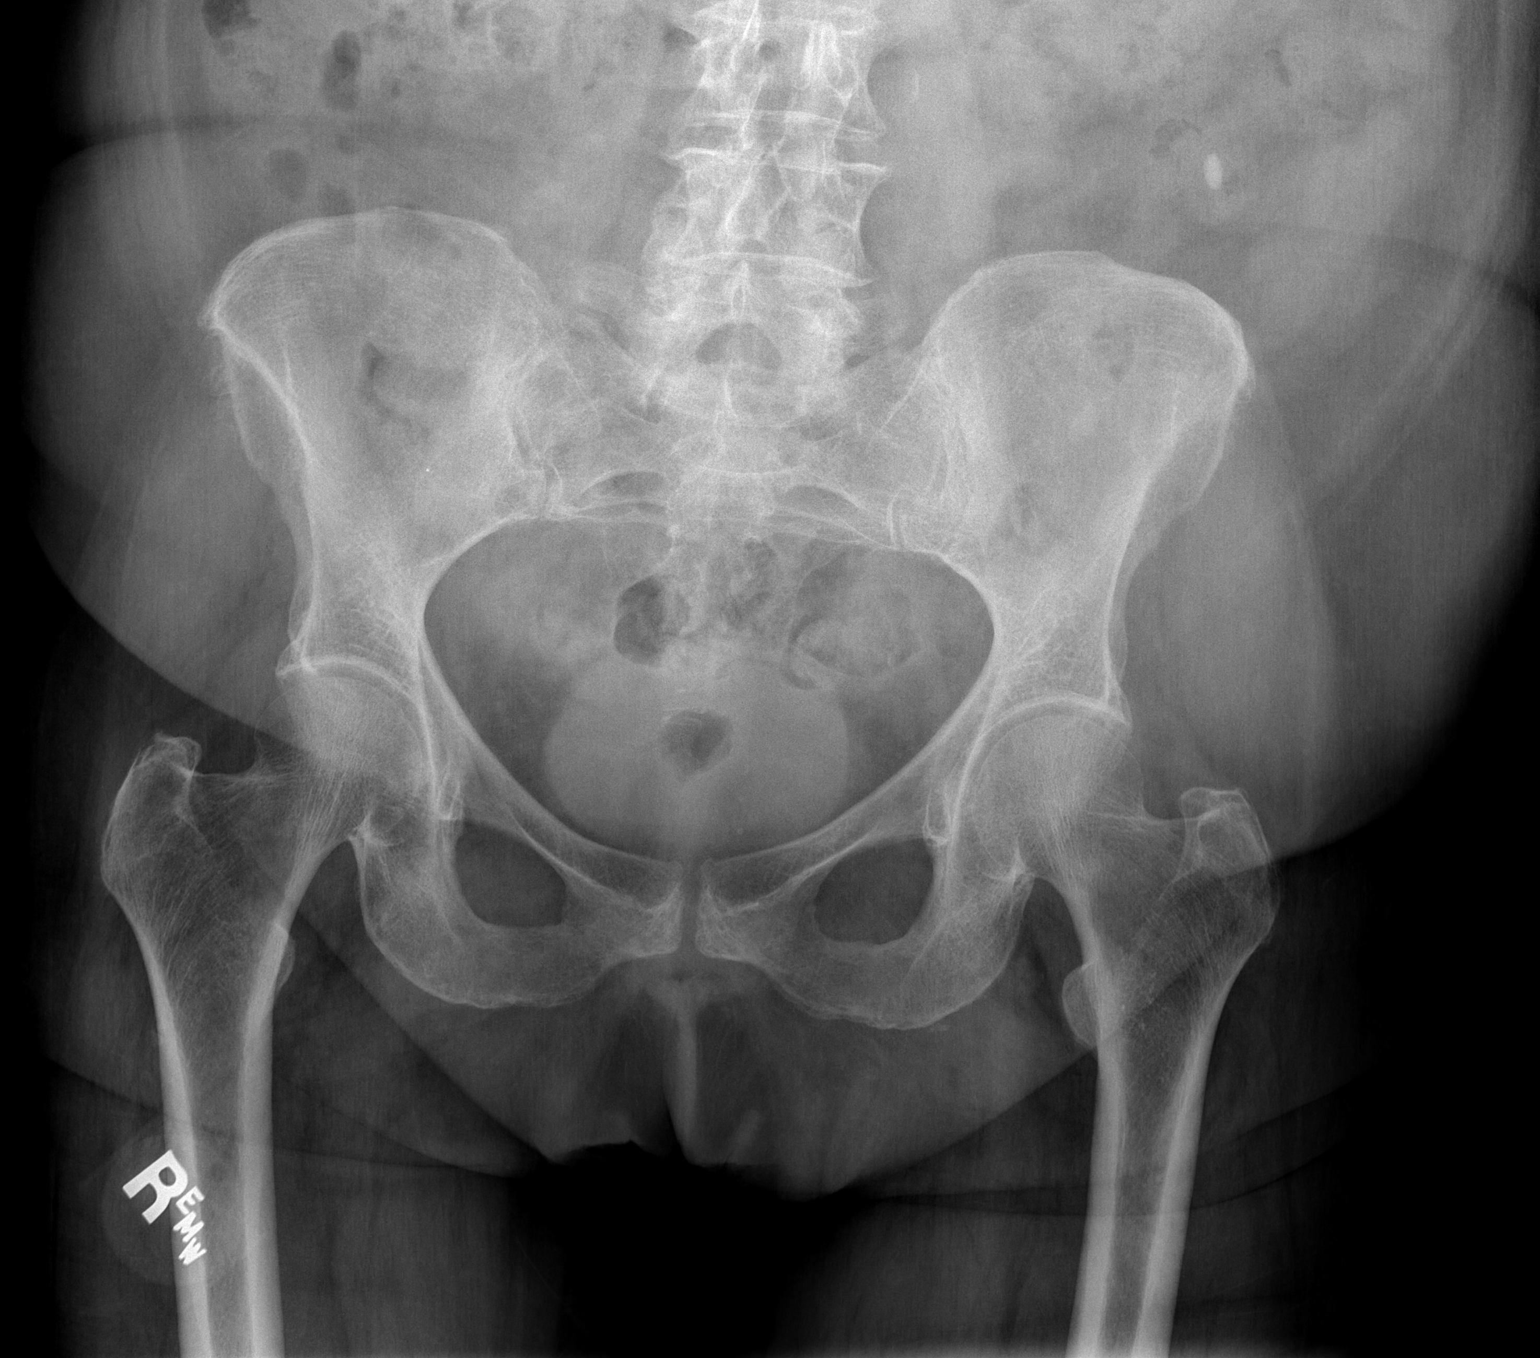

[1 of 1 positions shown; findings below may reference images not displayed]

FINDINGS: There is no evidence of hip fracture or dislocation. There is no
evidence of arthropathy or other focal bone abnormality.
IMPRESSION: No acute osseous injury of the right hip.

## 2019-08-12 ENCOUNTER — Ambulatory Visit: Payer: Medicare Other | Admitting: Family Medicine

## 2019-08-19 ENCOUNTER — Other Ambulatory Visit: Payer: Self-pay | Admitting: Family Medicine

## 2019-08-19 DIAGNOSIS — E118 Type 2 diabetes mellitus with unspecified complications: Secondary | ICD-10-CM

## 2019-08-19 DIAGNOSIS — IMO0002 Reserved for concepts with insufficient information to code with codable children: Secondary | ICD-10-CM

## 2019-08-19 DIAGNOSIS — E1165 Type 2 diabetes mellitus with hyperglycemia: Secondary | ICD-10-CM

## 2019-08-27 ENCOUNTER — Ambulatory Visit: Payer: Self-pay | Admitting: Pharmacist

## 2019-08-27 ENCOUNTER — Other Ambulatory Visit: Payer: Self-pay | Admitting: Pharmacist

## 2019-08-27 NOTE — Patient Outreach (Signed)
[facility]) Care Management  08/27/2019  SHAHNA SARACENO 12/20/1950 [id number]  Patient was called regarding medication assistance. Unfortunately, she did not answer her phone. HIPAA compliant message was left on both of her voicemails.  Plan: Send patient an unsuccessful outreach letter. Call patient back in 7-10 business days.  Elayne Guerin, PharmD, [place] Clinical Pharmacist [phone]

## 2019-09-02 ENCOUNTER — Ambulatory Visit: Payer: Self-pay | Admitting: Pharmacist

## 2019-09-02 ENCOUNTER — Other Ambulatory Visit: Payer: Self-pay | Admitting: Pharmacist

## 2019-09-02 NOTE — Patient Outreach (Addendum)
[facility]) Care Management  09/02/2019  Destiny Ballard [birth year] [id number]   Patient was called regarding medication assistance. HIPAA identifiers were obtained.   Patient is a 69 year old female with multiple medical conditions including but not limited to:  CAD, chest pain, CHF, allergic rhinitis, COPD, Depression, anxiety, GERD, hyperlipidemia, obesity, OSA, and  type 2 diabetes.  [employer] assisted the patient last year with medication assistance. She received [place] and Spiriva through [employer] program last year. She is over income for [employer].  Patient's medications were reviewed:  Medications Reviewed Today    Reviewed by Elayne Guerin, [facility] (Pharmacist) on 09/02/19 at 1207  Med List Status: <None>  Medication Order Taking? Sig Documenting Provider Last Dose Status Informant  albuterol (PROAIR HFA) 108 (90 Base) MCG/ACT inhaler [id number] No Inhale 2 puffs into the lungs every 4 (four) hours as needed for wheezing or shortness of breath.  Patient not taking: Reported on 09/02/2019   Collene Gobble, MD Not Taking Active Self  aspirin EC 81 MG tablet [id number] Yes Take 81 mg by mouth daily. [provider] Taking Active Self  benzonatate (TESSALON) 200 MG capsule [id number] Yes TAKE 1 CAPSULE BY MOUTH 3 TIMES A DAY AS NEEDED FOR COUGH Copland, Gay Filler, MD Taking Active Self  calcium carbonate (TUMS - DOSED IN MG ELEMENTAL CALCIUM) 500 MG chewable tablet [id number] Yes Chew 2 tablets by mouth daily as needed for indigestion or heartburn. [provider] Taking Active Self  cephALEXin (KEFLEX) 500 MG capsule [id number] Yes Take 1 capsule (500 mg total) by mouth 2 (two) times daily. Copland, Gay Filler, MD Taking Active   cetirizine (ZYRTEC) 10 MG tablet [id number]  TAKE 1 TABLET BY MOUTH DAILY AS NEEDED FOR ALLERGIES Copland, Gay Filler, MD  Active Self  Cholecalciferol (VITAMIN D3) 1000 UNITS CAPS [id number] Yes Take 1,000 Units  by mouth daily.  [provider] Taking Active Self  clonazePAM (KLONOPIN) 0.5 MG tablet [id number] No TAKE 0.5 TABLETS (0.25 MG TOTAL) BY MOUTH 2 TIMES DAILY AS NEEDED FOR ANXIETY  Patient not taking: Reported on 09/02/2019   Copland, Gay Filler, MD Not Taking Active Self  dapagliflozin propanediol (FARXIGA) 10 MG TABS tablet [id number] Yes Take 10 mg by mouth daily. Copland, Gay Filler, MD Taking Active Self  FLUoxetine (PROZAC) 40 MG capsule [id number] Yes Take 1 capsule (40 mg total) by mouth daily. Copland, Gay Filler, MD Taking Active Self  fluticasone (FLONASE) 50 MCG/ACT nasal spray [id number] Yes SPRAY 2 SPRAYS INTO EACH NOSTRIL EVERY DAY Collene Gobble, MD Taking Active Self  furosemide (LASIX) 20 MG tablet [id number] Yes TAKE 1 TABLET BY MOUTH EVERY DAY Burtis Junes, NP Taking Active   gabapentin (NEURONTIN) 300 MG capsule [id number] Yes TAKE 2 CAPSULES BY MOUTH TWICE A DAY Copland, Gay Filler, MD Taking Active   glipiZIDE (GLUCOTROL) 5 MG tablet [id number] Yes TAKE 1 TABLET (5 MG TOTAL) BY MOUTH DAILY BEFORE BREAKFAST. Copland, Gay Filler, MD Taking Active   hydrALAZINE (APRESOLINE) 25 MG tablet [id number] Yes TAKE 2 TABLETS (50 MG TOTAL) BY MOUTH 3 (THREE) TIMES DAILY. Burtis Junes, NP Taking Active   hydrOXYzine (ATARAX/VISTARIL) 25 MG tablet [id number] Yes TAKE 1/2 TO 1 TABLET BY MOUTH EVERY 8 HOURS AS NEEDED FOR ITCHING Copland, Gay Filler, MD Taking Active   LEVEMIR FLEXTOUCH 100 UNIT/ML Pen [id number] Yes INJECT 28 UNITS INTO THE SKIN DAILY.-- Valdese, MD Taking Active   losartan (COZAAR)  100 MG tablet [id number] Yes Take 1 tablet (100 mg total) by mouth daily. Burtis Junes, NP Taking Active Self           Med Note Coralyn Mark, ASHELEY Forrest Moron Jan 04, 2019  2:10 PM)    metFORMIN (GLUCOPHAGE-XR) 500 MG 24 hr tablet [id number] Yes TAKE 2 TABLETS BY MOUTH TWICE A DAY Copland, Gay Filler, MD Taking Active   methocarbamol (ROBAXIN) 750 MG tablet [id number] Yes TAKE  1 TABLET (750 MG TOTAL) BY MOUTH EVERY 12 (TWELVE) HOURS AS NEEDED FOR MUSCLE SPASMS. Copland, Gay Filler, MD Taking Active   metoprolol succinate (TOPROL-XL) 100 MG 24 hr tablet [id number] Yes TAKE 1 TABLET BY MOUTH EVERY DAY Copland, Gay Filler, MD Taking Active Self  montelukast (SINGULAIR) 10 MG tablet [id number] Yes TAKE 1 TABLET BY MOUTH EVERY DAY AS NEEDED FOR ALLERGIES Copland, Gay Filler, MD Taking Active   nitroGLYCERIN (NITROSTAT) 0.4 MG SL tablet [id number] Yes Place 1 tablet (0.4 mg total) under the tongue every 5 (five) minutes as needed for chest pain. Copland, Gay Filler, MD Taking Active Self  omega-3 acid ethyl esters (LOVAZA) 1 g capsule [id number] Yes TAKE 1 CAPSULE (1 G TOTAL) BY MOUTH 2 (TWO) TIMES DAILY. Burtis Junes, NP Taking Active Self  Polyvinyl Alcohol-Povidone (REFRESH OP) [id number] Yes Place 2 drops into both eyes daily as needed (for dry eyes). [provider] Taking Active Self  rosuvastatin (CRESTOR) 20 MG tablet [id number] Yes Take 1 tablet (20 mg total) by mouth daily. Copland, Gay Filler, MD Taking Active   Tiotropium Bromide Monohydrate (SPIRIVA RESPIMAT) 2.5 MCG/ACT AERS [id number] Yes Inhale 2 puffs into the lungs daily. Copland, Gay Filler, MD Taking Active Self           Med Note Agnes Lawrence Jan 04, 2019  2:10 PM)    traZODone (DESYREL) 50 MG tablet [id number] Yes TAKE 1/2 TO 1 TABLET BY MOUTH AT BEDTIME AS NEEDED FOR SLEEP Copland, Gay Filler, MD Taking Active   triamcinolone cream (KENALOG) 0.1 % [id number] Yes Apply 1 application topically 2 (two) times daily. Use once or twice a day for poison ivy- apply sparingly to face.  Use no longer than 1 week Copland, Gay Filler, MD Taking Active Self         Findings: HgA1c-9.9%-Levemir, Metformin, Glipizide -Levemir dose may need to be titrated to help with glucose control. (Currently 28 units daily)  On statin-rosuvastatin-last filled:  07/29/2019 #90  Patient is over income for LIS/Extra Help She  was over income for [place] year.  Will send application for Spiriva [employer])  Patient said she really does not use Albuterol HFA and did not complete the attestation form from last year.  Plan: I will route patient assistance letter to [place] technician who will coordinate patient assistance program application process for medications listed above.  [facility] pharmacy technician will assist with obtaining all required documents from both patient and provider(s) and submit application(s) once completed.    Elayne Guerin, PharmD, [place] Clinical Pharmacist [phone]

## 2019-09-04 DIAGNOSIS — Z0000 Encounter for general adult medical examination without abnormal findings: Secondary | ICD-10-CM | POA: Diagnosis not present

## 2019-09-07 ENCOUNTER — Other Ambulatory Visit: Payer: Self-pay | Admitting: Pharmacy Technician

## 2019-09-07 NOTE — Patient Outreach (Signed)
[facility]) Care Management  09/07/2019  Destiny Ballard 03-11-1951 [id number]                                       Medication Assistance Referral  Referral From: [place]  Medication/Company: Spiriva Respimat / BI Patient application portion:  Mailed Provider application portion: Faxed  to Dr. Lamar Blinks Provider address/fax verified via: Office website     Follow up:  Will follow up with patient in 5-15 business days to confirm application(s) have been received.  [name] P. Tylor Gambrill, [employer] Management [phone]

## 2019-09-08 ENCOUNTER — Telehealth: Payer: Self-pay | Admitting: Family Medicine

## 2019-09-08 ENCOUNTER — Other Ambulatory Visit: Payer: Self-pay

## 2019-09-08 NOTE — Telephone Encounter (Signed)
Patient is calling requesting medication for hip pain. Please advise

## 2019-09-08 NOTE — Telephone Encounter (Signed)
Patient will need an appointment for evaluation of hip pain prior to giving medication.

## 2019-09-09 ENCOUNTER — Encounter: Payer: Self-pay | Admitting: Family Medicine

## 2019-09-09 ENCOUNTER — Ambulatory Visit (HOSPITAL_BASED_OUTPATIENT_CLINIC_OR_DEPARTMENT_OTHER)
Admission: RE | Admit: 2019-09-09 | Discharge: 2019-09-09 | Disposition: A | Discharge status: Home / Self Care | Payer: Medicare Other | Source: Ambulatory Visit | Attending: Family Medicine | Admitting: Family Medicine

## 2019-09-09 ENCOUNTER — Ambulatory Visit (INDEPENDENT_AMBULATORY_CARE_PROVIDER_SITE_OTHER): Payer: Medicare Other | Admitting: Family Medicine

## 2019-09-09 ENCOUNTER — Other Ambulatory Visit: Payer: Self-pay

## 2019-09-09 VITALS — BP 134/64 | HR 75 | Temp 97.3°F | Resp 18 | Ht 59.0 in | Wt 184.0 lb

## 2019-09-09 DIAGNOSIS — M545 Low back pain, unspecified: Secondary | ICD-10-CM

## 2019-09-09 DIAGNOSIS — I5032 Chronic diastolic (congestive) heart failure: Secondary | ICD-10-CM

## 2019-09-09 DIAGNOSIS — E785 Hyperlipidemia, unspecified: Secondary | ICD-10-CM

## 2019-09-09 DIAGNOSIS — E1165 Type 2 diabetes mellitus with hyperglycemia: Secondary | ICD-10-CM | POA: Diagnosis not present

## 2019-09-09 DIAGNOSIS — E118 Type 2 diabetes mellitus with unspecified complications: Secondary | ICD-10-CM | POA: Diagnosis not present

## 2019-09-09 DIAGNOSIS — Z794 Long term (current) use of insulin: Secondary | ICD-10-CM | POA: Diagnosis not present

## 2019-09-09 DIAGNOSIS — F329 Major depressive disorder, single episode, unspecified: Secondary | ICD-10-CM

## 2019-09-09 DIAGNOSIS — I10 Essential (primary) hypertension: Secondary | ICD-10-CM

## 2019-09-09 DIAGNOSIS — M79605 Pain in left leg: Secondary | ICD-10-CM

## 2019-09-09 DIAGNOSIS — IMO0002 Reserved for concepts with insufficient information to code with codable children: Secondary | ICD-10-CM

## 2019-09-09 DIAGNOSIS — M47816 Spondylosis without myelopathy or radiculopathy, lumbar region: Secondary | ICD-10-CM | POA: Diagnosis not present

## 2019-09-09 LAB — LIPID PANEL
Cholesterol: 145 mg/dL (ref 0–200)
HDL: 42.6 mg/dL (ref >39.00)
NonHDL: 102.44
Total CHOL/HDL Ratio: 3
Triglycerides: 363 mg/dL — ABNORMAL HIGH (ref 0.0–149.0)
VLDL: 72.6 mg/dL — ABNORMAL HIGH (ref 0.0–40.0)

## 2019-09-09 LAB — LDL CHOLESTEROL, DIRECT: Direct LDL: 64 mg/dL

## 2019-09-09 MED ORDER — HYDROCODONE-ACETAMINOPHEN 5-325 MG PO TABS: 1.0000 | ORAL_TABLET | Freq: Four times a day (QID) | ORAL | 0 refills | Status: AC | PRN

## 2019-09-09 NOTE — Patient Instructions (Signed)
Good to see you today- please go to lab and then to x-ray on the ground floor I will be in touch with your results I think you have a bulging disc bothering a nerve We are going to use a pain medication for you as needed- do not drive after taking this medication and do not combine with any other sedative  Please let me know if you are getting worse If your symptoms do not resolve we can look at getting an MRI  Please do get your routine eye exam soon

## 2019-09-09 NOTE — Progress Notes (Addendum)
[place] at [facility] [address] [place], [place] [address] [phone]  Date:  09/09/2019   Name:  Destiny Ballard   DOB:  July 20, 1950   MRN:  [id number]  PCP:  Darreld Mclean, MD    Chief Complaint: Hip Pain (left side, radiating down to leg, sciatica)   History of Present Illness:  Destiny Ballard is a 69 y.o. very pleasant female patient who presents with the following:  In person visit today due to concern of hip and leg pain Patient with history of CAD status post stent 2007, CHF, hypertension, sleep apnea, COPD, diabetes, hyperlipidemia I saw her about 1 month ago for virtual visit due to urinary symptoms In October she fell walking to her mailbox, broke a rib.  This caused her a lot of pain  We treated her with oxycodone at that time-her ribs are now a lot better  05/19/2019  2   05/19/2019  Oxycodone-Acetaminophen 5-325  15.00  4 Je Cop   [id number]   Nor ([id number])   0  28.12 MME  Comm Ins   [place]  05/10/2019  2   05/10/2019  Oxycodone-Acetaminophen 5-325  15.00  4 Je Cop   [id number]   Nor ([id number])   0  28.12 MME  Comm Ins   [place]  05/03/2019  2   05/03/2019  Oxycodone-Acetaminophen 5-325  30.00  7 Je Cop   [id number]   Nor ([id number])   0  32.14 MME  Comm Ins   [place]  04/29/2019  2   04/29/2019  Hydrocodone-Acetamin 5-325 MG  20.00  6 Je Cop   [id number]   Nor ([id number])   0  16.67 MME  Comm Ins   [place]  04/26/2019  4   04/26/2019  Hydrocodone-Acetamin 5-325 MG  15.00  4 Ma Ven   [id number]   Med ([id number])   0  18.75 MME  Comm Ins   [place]  01/12/2019  2   01/12/2019  Hydrocodone-Acetamin 5-325 MG  15.00  3 Ma Pfe   [id number]   Nor ([id number])   0  25.00 MME  Comm Ins   [place]  08/25/2018  2   08/23/2018  Hydrocodone-Acetamin 5-325 MG  20.00  3 Ca Isa   [id number]   Nor ([id number])   0  33.33 MME  Comm Ins   [place]  08/23/2018  3   08/23/2018  Hydrocodone-Acetamin 5-325 MG  20.00  2 Ca Isa   [id number]   Wal ([id number])   0  50.00 MME       She was noted to have persistent mild  leukocytosis, I referred her to hematology for evaluation.  This was thought to be reactive, they plan to see her in 6 months  Due for eye exam-reminded patient that this is due Last A1c elevated, recheck today At last visit we increased her glipizide from 5 mg daily to twice daily We also changed her lipid medication to Crestor-can repeat lipids today Lab Results  Component Value Date   HGBA1C 9.9 (H) 04/29/2019   Albuterol Spiriva Aspirin 81 Clonazepam Trazodone Farxiga Metformin Glipizide Levemir Losartan Crestor Lofibra Metoprolol  She is here today with left lower back pain going her left leg.  She noticed this about a month ago- seems to be getting worse She thinks it may be worse with change in barometric pressure as well She has pain when she gets up to walk She may have  numbness of her leg- into the 4th toe- but no weakness The right leg is ok No further injuries or falls  No urinary sx or bowel symptoms  She tried some muscle relaxer pills but they did not help  She had some lumbar films in 2018: IMPRESSION: Mild lumbar degenerative change.  Lab Results  Component Value Date   HGBA1C 9.9 (H) 04/29/2019    Kidney disease runs in her family- her bother is on dialysis  Thankfully her renal function has been normal   Patient Active Problem List   Diagnosis Date Noted  . Obesity (BMI 30-39.9) 08/24/2018  . Depression with anxiety 08/24/2018  . CAD (coronary artery disease) 08/24/2018  . Abnormal cardiovascular stress test 08/24/2018  . Lobar pneumonia, unspecified organism ([place]) 11/13/2017  . Precordial chest pain   . Sepsis ([place]) 09/18/2017  . Chronic diastolic CHF (congestive heart failure) ([place]) 09/18/2017  . Left hip pain 10/28/2016  . Chronic rhinitis 08/07/2016  . COPD with acute exacerbation ([place]) 04/25/2016  . Essential hypertension 01/30/2016  . Hypokalemia 01/27/2016  . HLD (hyperlipidemia)   . Type 2 diabetes mellitus without complication,  without long-term current use of insulin ([place])   . OSA (obstructive sleep apnea) 05/06/2014  . Dyspnea on exertion 09/02/2012  . Chest pain 12/10/2011  . Chronic cough 05/11/2010  . GERD 06/28/2008    Past Medical History:  Diagnosis Date  . Anxiety    Prior suicide attempt  . CAD (coronary artery disease)    a) s/p DES to LAD 07/2005 b) Last Myoview low risk 11/2011 showing small fixed apical perfusion defect (prior MI vs attenuation) but no ischemia - normal EF.  [name] Cervical spondylosis   . Coronary atherosclerosis 06/28/2008  . Depression   . Depression with anxiety   . Diabetes mellitus without complication ([place])   . GERD (gastroesophageal reflux disease)   . Hyperlipidemia   . Hypertension   . Insulin resistance   . Iron deficiency anemia   . Obesity     Past Surgical History:  Procedure Laterality Date  . BREAST ENHANCEMENT SURGERY    . CARDIAC CATHETERIZATION  06/17/2007   NORMAL. EF 60%  . CARDIAC CATHETERIZATION N/A 01/29/2016   Procedure: Left Heart Cath and Coronary Angiography;  Surgeon: Sherren Mocha, MD;  Location: [facility];  Service: Cardiovascular;  Laterality: N/A;  . CERVICAL SPONDYLOSIS     SINGLE LEVEL FUSION  . CHILDBIRTH     X3  . CORONARY STENT PLACEMENT  07/2005   LEFT ANTERIOR DESCENDING  . FOREARM FRACTURE SURGERY  2010   hand and shoulder   . INCISION AND DRAINAGE BREAST ABSCESS  01/05/2012      . INCISION AND DRAINAGE PERIRECTAL ABSCESS N/A 02/18/2014   Procedure: IRRIGATION AND DEBRIDEMENT PERIRECTAL ABSCESS;  Surgeon: Pedro Earls, MD;  Location: [facility];  Service: General;  Laterality: N/A;  . LEFT HEART CATH AND CORONARY ANGIOGRAPHY N/A 10/10/2017   Procedure: LEFT HEART CATH AND CORONARY ANGIOGRAPHY;  Surgeon: Burnell Blanks, MD;  Location: [facility];  Service: Cardiovascular;  Laterality: N/A;  . LUMBAR LAMINECTOMY    . ROTATOR CUFF REPAIR     bilaterla  . TONSILLECTOMY AND ADENOIDECTOMY    . TUBAL LIGATION     . VIDEO BRONCHOSCOPY Bilateral 08/13/2016   Procedure: VIDEO BRONCHOSCOPY WITHOUT FLUORO;  Surgeon: Collene Gobble, MD;  Location: [facility];  Service: Cardiopulmonary;  Laterality: Bilateral;    Social History   Tobacco Use  .  Smoking status: Passive Smoke Exposure - Never Smoker  . Smokeless tobacco: Never Used  Substance Use Topics  . Alcohol use: Yes    Comment: occ  . Drug use: No    Family History  Problem Relation Age of Onset  . Heart attack Mother   . Diabetes Mother   . Lung cancer Mother   . Asthma Mother   . Heart disease Mother   . Suicidality Father        "killed himself"  . Asthma Daughter        x 2  . Cancer Daughter        pre-cancerous polyp  . Diabetes Sister   . Cancer Sister   . Cervical cancer Daughter        cervical   . Allergies Other        all family--seasonal allergies    Allergies  Allergen Reactions  . Prednisone Other (See Comments)    REACTION: mood swings, nightmares. "Shot doesn't bother me, reaction is just with the pill" she states she has had the steroid injections before. From our records methylprednisone was given to her in 2013 without any complications.    Medication list has been reviewed and updated.  Current Outpatient Medications on File Prior to Visit  Medication Sig Dispense Refill  . aspirin EC 81 MG tablet Take 81 mg by mouth daily.    . benzonatate (TESSALON) 200 MG capsule TAKE 1 CAPSULE BY MOUTH 3 TIMES A DAY AS NEEDED FOR COUGH 90 capsule 5  . calcium carbonate (TUMS - DOSED IN MG ELEMENTAL CALCIUM) 500 MG chewable tablet Chew 2 tablets by mouth daily as needed for indigestion or heartburn.    . cephALEXin (KEFLEX) 500 MG capsule Take 1 capsule (500 mg total) by mouth 2 (two) times daily. 14 capsule 0  . cetirizine (ZYRTEC) 10 MG tablet TAKE 1 TABLET BY MOUTH DAILY AS NEEDED FOR ALLERGIES 90 tablet 1  . Cholecalciferol (VITAMIN D3) 1000 UNITS CAPS Take 1,000 Units by mouth daily.     . dapagliflozin  propanediol (FARXIGA) 10 MG TABS tablet Take 10 mg by mouth daily. 90 tablet 3  . FLUoxetine (PROZAC) 40 MG capsule Take 1 capsule (40 mg total) by mouth daily. 90 capsule 3  . fluticasone (FLONASE) 50 MCG/ACT nasal spray SPRAY 2 SPRAYS INTO EACH NOSTRIL EVERY DAY 48 g 0  . furosemide (LASIX) 20 MG tablet TAKE 1 TABLET BY MOUTH EVERY DAY 90 tablet 2  . gabapentin (NEURONTIN) 300 MG capsule TAKE 2 CAPSULES BY MOUTH TWICE A DAY 360 capsule 1  . glipiZIDE (GLUCOTROL) 5 MG tablet TAKE 1 TABLET (5 MG TOTAL) BY MOUTH DAILY BEFORE BREAKFAST. 90 tablet 1  . hydrALAZINE (APRESOLINE) 25 MG tablet TAKE 2 TABLETS (50 MG TOTAL) BY MOUTH 3 (THREE) TIMES DAILY. 540 tablet 2  . hydrOXYzine (ATARAX/VISTARIL) 25 MG tablet TAKE 1/2 TO 1 TABLET BY MOUTH EVERY 8 HOURS AS NEEDED FOR ITCHING 270 tablet 0  . LEVEMIR FLEXTOUCH 100 UNIT/ML Pen INJECT 28 UNITS INTO THE SKIN DAILY.-- 53 DAY SUPPLY 15 mL 1  . losartan (COZAAR) 100 MG tablet Take 1 tablet (100 mg total) by mouth daily. 90 tablet 3  . metFORMIN (GLUCOPHAGE-XR) 500 MG 24 hr tablet TAKE 2 TABLETS BY MOUTH TWICE A DAY 360 tablet 1  . methocarbamol (ROBAXIN) 750 MG tablet TAKE 1 TABLET (750 MG TOTAL) BY MOUTH EVERY 12 (TWELVE) HOURS AS NEEDED FOR MUSCLE SPASMS. 60 tablet 3  . metoprolol succinate (  TOPROL-XL) 100 MG 24 hr tablet TAKE 1 TABLET BY MOUTH EVERY DAY 90 tablet 3  . montelukast (SINGULAIR) 10 MG tablet TAKE 1 TABLET BY MOUTH EVERY DAY AS NEEDED FOR ALLERGIES 90 tablet 1  . nitroGLYCERIN (NITROSTAT) 0.4 MG SL tablet Place 1 tablet (0.4 mg total) under the tongue every 5 (five) minutes as needed for chest pain. 20 tablet 3  . omega-3 acid ethyl esters (LOVAZA) 1 g capsule TAKE 1 CAPSULE (1 G TOTAL) BY MOUTH 2 (TWO) TIMES DAILY. 180 capsule 1  . Polyvinyl Alcohol-Povidone (REFRESH OP) Place 2 drops into both eyes daily as needed (for dry eyes).    . rosuvastatin (CRESTOR) 20 MG tablet Take 1 tablet (20 mg total) by mouth daily. 90 tablet 3  . Tiotropium  Bromide Monohydrate (SPIRIVA RESPIMAT) 2.5 MCG/ACT AERS Inhale 2 puffs into the lungs daily. 1 Inhaler 11  . traZODone (DESYREL) 50 MG tablet TAKE 1/2 TO 1 TABLET BY MOUTH AT BEDTIME AS NEEDED FOR SLEEP 90 tablet 1  . triamcinolone cream (KENALOG) 0.1 % Apply 1 application topically 2 (two) times daily. Use once or twice a day for poison ivy- apply sparingly to face.  Use no longer than 1 week 30 g 0   No current facility-administered medications on file prior to visit.    Review of Systems:  As per HPI- otherwise negative.   Physical Examination: Vitals:   09/09/19 1102  BP: 134/64  Pulse: 75  Resp: 18  Temp: (!) 97.3 F (36.3 C)  SpO2: 95%   Vitals:   09/09/19 1102  Weight: 184 lb (83.5 kg)  Height: 4\' 11"  (1.499 m)   Body mass index is 37.16 kg/m. Ideal Body Weight: Weight in (lb) to have BMI = 25: 123.5  GEN: no acute distress.  Obese, petite build.  Otherwise looks well HEENT: Atraumatic, Normocephalic.  Ears and Nose: No external deformity. CV: RRR, No M/G/R. No JVD. No thrill. No extra heart sounds. PULM: CTA B, no wheezes, crackles, rhonchi. No retractions. No resp. distress. No accessory muscle use. ABD: S, NT, ND, +BS. No rebound. No HSM. EXTR: No c/c/e PSYCH: Normally interactive. Conversant.  Bilateral lower extremities display normal strength, sensation, DTR, deep tendon reflexes bilaterally.  She has tenderness over the left buttock and left sciatic notch.  Normal thoracolumbar flexion and extension.   Assessment and Plan: Uncontrolled type 2 diabetes mellitus with complication, with long-term current use of insulin ([place]) - Plan: Hemoglobin A1c  Hyperlipidemia, unspecified hyperlipidemia type - Plan: Lipid panel  Essential hypertension, benign  Chronic diastolic CHF (congestive heart failure) (H[facility])  Reactive depression  Low back pain radiating to left lower extremity - Plan: HYDROcodone-acetaminophen (NORCO/VICODIN) 5-325 MG tablet, DG Lumbar Spine  Complete  Here today with concern of lower back pain radiating to her left leg, possible bulging disc. She has a hard time using steroids, due to side effects and also uncontrolled diabetes I will check her A1c, if control is adequate can consider a steroid for her back if needed For the time being gave her a small supply of hydrocodone to use as needed Lumbar spine films pending Labs pending as above  She is asked to let me know if getting worse or if not improving over the next several days  Moderate medical decision making today This visit occurred during the SARS-CoV-2 public health emergency.  Safety protocols were in place, including screening questions prior to the visit, additional usage of staff PPE, and extensive cleaning of exam room  while observing appropriate contact time as indicated for disinfecting solutions.    Signed Lamar Blinks, MD  Addendum 2/26, received her labs and x-ray report  Gave her a call  Her back is doing better, she took 1 pain pill yesterday.  She will keep me posted diabetes is still poorly controlled.  I would like to refer her to endocrinology for evaluation, she is agreeable  lipids not ideal.  Would like to increase Crestor to 40 mg, she wonders about side effects.  I asked her to try taking 2 of her current 20 mg pills for a few days, and see how she tolerates.  She will let me know  DG Lumbar Spine Complete  Result Date: 09/09/2019 CLINICAL DATA:  Pain and numbness EXAM: LUMBAR SPINE - COMPLETE 4+ VIEW COMPARISON:  09/04/2016 FINDINGS: Dense aortic atherosclerosis. Lumbar alignment within normal limits. Vertebral body heights are maintained. Multiple level degenerative change. Mild disc space narrowing at L2-L3, L4-L5 and L5-S1. Posterior facet degenerative change of the lower lumbar spine. IMPRESSION: Mild diffuse degenerative changes.  No acute osseous abnormality. Electronically Signed   By: Donavan Foil M.D.   On: 09/09/2019 16:38    Results for orders placed or performed in visit on 09/09/19  Hemoglobin A1c  Result Value Ref Range   Hgb A1c MFr Bld 9.6 (H) 4.6 - 6.5 %  Lipid panel  Result Value Ref Range   Cholesterol 145 0 - 200 mg/dL   Triglycerides 363.0 (H) 0.0 - 149.0 mg/dL   HDL 42.60 >39.00 mg/dL   VLDL 72.6 (H) 0.0 - 40.0 mg/dL   Total CHOL/HDL Ratio 3    NonHDL 102.44   LDL cholesterol, direct  Result Value Ref Range   Direct LDL 64.0 mg/dL   The 10-year ASCVD risk score Mikey Bussing DC Jr., et al., 2013) is: 23.8%   Values used to calculate the score:     Age: 30 years     Sex: Female     Is Non-Hispanic African American: No     Diabetic: Yes     Tobacco smoker: No     Systolic Blood Pressure: [id number] mmHg     Is BP treated: Yes     HDL Cholesterol: 42.6 mg/dL     Total Cholesterol: 145 mg/dL Farxiga 10 Glipizide 5 twice daily Levemir 28 units Metformin 1000 mg twice daily Crestor 20 [place]

## 2019-09-10 LAB — HEMOGLOBIN A1C: Hgb A1c MFr Bld: 9.6 % — ABNORMAL HIGH (ref 4.6–6.5)

## 2019-09-10 NOTE — Addendum Note (Signed)
Addended by: Lamar Blinks C on: 09/10/2019 04:01 PM   Modules accepted: Orders

## 2019-09-20 ENCOUNTER — Other Ambulatory Visit: Payer: Self-pay

## 2019-09-21 ENCOUNTER — Other Ambulatory Visit: Payer: Self-pay | Admitting: Pharmacy Technician

## 2019-09-21 ENCOUNTER — Other Ambulatory Visit: Payer: Self-pay

## 2019-09-21 NOTE — Progress Notes (Deleted)
[place] at [facility] [address], [place], [place] [address] [phone]  Date:  09/22/2019   Name:  Destiny Ballard   DOB:  [birth year]   MRN:  [id number]  PCP:  Destiny Mclean, MD    Chief Complaint: No chief complaint on file.   History of Present Illness:Destiny Ballard is a 69 y.o. very pleasant female patient who presents with the following:   In person visit today for concern of rash Patient with history of CAD, CHF, diabetes, COPD, sleep apnea, hyperlipidemia, hypertension Last seen by myself last month for follow-up  Lab Results  Component Value Date   HGBA1C 9.6 (H) 09/09/2019   At that time her A1c was elevated as above-we will plan referral to endocrinology, and increase Crestor to 40 mg by taking 2/20 milligrams pills-  ?  How this works out  Patient Active Problem List   Diagnosis Date Noted  . Obesity (BMI 30-39.9) 08/24/2018  . Depression with anxiety 08/24/2018  . CAD (coronary artery disease) 08/24/2018  . Abnormal cardiovascular stress test 08/24/2018  . Lobar pneumonia, unspecified organism ([place]) 11/13/2017  . Precordial chest pain   . Sepsis ([place]) 09/18/2017  . Chronic diastolic CHF (congestive heart failure) ([place]) 09/18/2017  . Left hip pain 10/28/2016  . Chronic rhinitis 08/07/2016  . COPD with acute exacerbation ([place]) 04/25/2016  . Essential hypertension 01/30/2016  . Hypokalemia 01/27/2016  . HLD (hyperlipidemia)   . Type 2 diabetes mellitus without complication, without long-term current use of insulin ([place])   . OSA (obstructive sleep apnea) 05/06/2014  . Dyspnea on exertion 09/02/2012  . Chest pain 12/10/2011  . Chronic cough 05/11/2010  . GERD 06/28/2008    Past Medical History:  Diagnosis Date  . Anxiety    Prior suicide attempt  . CAD (coronary artery disease)    a) s/p DES to LAD 07/2005 b) Last Myoview low risk 11/2011 showing small fixed apical perfusion defect (prior MI vs  attenuation) but no ischemia - normal EF.  [name] Cervical spondylosis   . Coronary atherosclerosis 06/28/2008  . Depression   . Depression with anxiety   . Diabetes mellitus without complication ([place])   . GERD (gastroesophageal reflux disease)   . Hyperlipidemia   . Hypertension   . Insulin resistance   . Iron deficiency anemia   . Obesity     Past Surgical History:  Procedure Laterality Date  . BREAST ENHANCEMENT SURGERY    . CARDIAC CATHETERIZATION  06/17/2007   NORMAL. EF 60%  . CARDIAC CATHETERIZATION N/A 01/29/2016   Procedure: Left Heart Cath and Coronary Angiography;  Surgeon: Sherren Mocha, MD;  Location: [facility];  Service: Cardiovascular;  Laterality: N/A;  . CERVICAL SPONDYLOSIS     SINGLE LEVEL FUSION  . CHILDBIRTH     X3  . CORONARY STENT PLACEMENT  07/2005   LEFT ANTERIOR DESCENDING  . FOREARM FRACTURE SURGERY  2010   hand and shoulder   . INCISION AND DRAINAGE BREAST ABSCESS  01/05/2012      . INCISION AND DRAINAGE PERIRECTAL ABSCESS N/A 02/18/2014   Procedure: IRRIGATION AND DEBRIDEMENT PERIRECTAL ABSCESS;  Surgeon: Pedro Earls, MD;  [facility];  Service: General;  Laterality: N/A;  . LEFT HEART CATH AND CORONARY ANGIOGRAPHY N/A 10/10/2017   Procedure: LEFT HEART CATH AND CORONARY ANGIOGRAPHY;  Surgeon: Burnell Blanks, MD;  Location: [facility];  Service: Cardiovascular;  Laterality: N/A;  .  LUMBAR LAMINECTOMY    . ROTATOR CUFF REPAIR     bilaterla  . TONSILLECTOMY AND ADENOIDECTOMY    . TUBAL LIGATION    . VIDEO BRONCHOSCOPY Bilateral 08/13/2016   Procedure: VIDEO BRONCHOSCOPY WITHOUT FLUORO;  Surgeon: Collene Gobble, MD;  [facility];  Service: Cardiopulmonary;  Laterality: Bilateral;    Social History   Tobacco Use  . Smoking status: Passive Smoke Exposure - Never Smoker  . Smokeless tobacco: Never Used  Substance Use Topics  . Alcohol use: Yes    Comment: occ  . Drug use: No    Family History  Problem  Relation Age of Onset  . Heart attack Mother   . Diabetes Mother   . Lung cancer Mother   . Asthma Mother   . Heart disease Mother   . Suicidality Father        "killed himself"  . Asthma Daughter        x 2  . Cancer Daughter        pre-cancerous polyp  . Diabetes Sister   . Cancer Sister   . Cervical cancer Daughter        cervical   . Allergies Other        all family--seasonal allergies    Allergies  Allergen Reactions  . Prednisone Other (See Comments)    REACTION: mood swings, nightmares. "Shot doesn't bother me, reaction is just with the pill" she states she has had the steroid injections before. From our records methylprednisone was given to her in 2013 without any complications.    Medication list has been reviewed and updated.  Current Outpatient Medications on File Prior to Visit  Medication Sig Dispense Refill  . aspirin EC 81 MG tablet Take 81 mg by mouth daily.    . benzonatate (TESSALON) 200 MG capsule TAKE 1 CAPSULE BY MOUTH 3 TIMES A DAY AS NEEDED FOR COUGH 90 capsule 5  . calcium carbonate (TUMS - DOSED IN MG ELEMENTAL CALCIUM) 500 MG chewable tablet Chew 2 tablets by mouth daily as needed for indigestion or heartburn.    . cephALEXin (KEFLEX) 500 MG capsule Take 1 capsule (500 mg total) by mouth 2 (two) times daily. 14 capsule 0  . cetirizine (ZYRTEC) 10 MG tablet TAKE 1 TABLET BY MOUTH DAILY AS NEEDED FOR ALLERGIES 90 tablet 1  . Cholecalciferol (VITAMIN D3) 1000 UNITS CAPS Take 1,000 Units by mouth daily.     . dapagliflozin propanediol (FARXIGA) 10 MG TABS tablet Take 10 mg by mouth daily. 90 tablet 3  . FLUoxetine (PROZAC) 40 MG capsule Take 1 capsule (40 mg total) by mouth daily. 90 capsule 3  . fluticasone (FLONASE) 50 MCG/ACT nasal spray SPRAY 2 SPRAYS INTO EACH NOSTRIL EVERY DAY 48 g 0  . furosemide (LASIX) 20 MG tablet TAKE 1 TABLET BY MOUTH EVERY DAY 90 tablet 2  . gabapentin (NEURONTIN) 300 MG capsule TAKE 2 CAPSULES BY MOUTH TWICE A DAY 360  capsule 1  . glipiZIDE (GLUCOTROL) 5 MG tablet TAKE 1 TABLET (5 MG TOTAL) BY MOUTH DAILY BEFORE BREAKFAST. 90 tablet 1  . hydrALAZINE (APRESOLINE) 25 MG tablet TAKE 2 TABLETS (50 MG TOTAL) BY MOUTH 3 (THREE) TIMES DAILY. 540 tablet 2  . hydrOXYzine (ATARAX/VISTARIL) 25 MG tablet TAKE 1/2 TO 1 TABLET BY MOUTH EVERY 8 HOURS AS NEEDED FOR ITCHING 270 tablet 0  . LEVEMIR FLEXTOUCH 100 UNIT/ML Pen INJECT 28 UNITS INTO THE SKIN DAILY.-- 53 DAY SUPPLY 15 mL 1  .  losartan (COZAAR) 100 MG tablet Take 1 tablet (100 mg total) by mouth daily. 90 tablet 3  . metFORMIN (GLUCOPHAGE-XR) 500 MG 24 hr tablet TAKE 2 TABLETS BY MOUTH TWICE A DAY 360 tablet 1  . methocarbamol (ROBAXIN) 750 MG tablet TAKE 1 TABLET (750 MG TOTAL) BY MOUTH EVERY 12 (TWELVE) HOURS AS NEEDED FOR MUSCLE SPASMS. 60 tablet 3  . metoprolol succinate (TOPROL-XL) 100 MG 24 hr tablet TAKE 1 TABLET BY MOUTH EVERY DAY 90 tablet 3  . montelukast (SINGULAIR) 10 MG tablet TAKE 1 TABLET BY MOUTH EVERY DAY AS NEEDED FOR ALLERGIES 90 tablet 1  . nitroGLYCERIN (NITROSTAT) 0.4 MG SL tablet Place 1 tablet (0.4 mg total) under the tongue every 5 (five) minutes as needed for chest pain. 20 tablet 3  . omega-3 acid ethyl esters (LOVAZA) 1 g capsule TAKE 1 CAPSULE (1 G TOTAL) BY MOUTH 2 (TWO) TIMES DAILY. 180 capsule 1  . Polyvinyl Alcohol-Povidone (REFRESH OP) Place 2 drops into both eyes daily as needed (for dry eyes).    . rosuvastatin (CRESTOR) 20 MG tablet Take 1 tablet (20 mg total) by mouth daily. 90 tablet 3  . Tiotropium Bromide Monohydrate (SPIRIVA RESPIMAT) 2.5 MCG/ACT AERS Inhale 2 puffs into the lungs daily. 1 Inhaler 11  . traZODone (DESYREL) 50 MG tablet TAKE 1/2 TO 1 TABLET BY MOUTH AT BEDTIME AS NEEDED FOR SLEEP 90 tablet 1  . triamcinolone cream (KENALOG) 0.1 % Apply 1 application topically 2 (two) times daily. Use once or twice a day for poison ivy- apply sparingly to face.  Use no longer than 1 week 30 g 0   No current facility-administered  medications on file prior to visit.    Review of Systems:  As per HPI- otherwise negative.   Physical Examination: There were no vitals filed for this visit. There were no vitals filed for this visit. There is no height or weight on file to calculate BMI. Ideal Body Weight:    GEN: no acute distress. HEENT: Atraumatic, Normocephalic.  Ears and Nose: No external deformity. CV: RRR, No M/G/R. No JVD. No thrill. No extra heart sounds. PULM: CTA B, no wheezes, crackles, rhonchi. No retractions. No resp. distress. No accessory muscle use. ABD: S, NT, ND, +BS. No rebound. No HSM. EXTR: No c/c/e PSYCH: Normally interactive. Conversant.    Assessment and Plan: *** This visit occurred during the SARS-CoV-2 public health emergency.  Safety protocols were in place, including screening questions prior to the visit, additional usage of staff PPE, and extensive cleaning of exam room while observing appropriate contact time as indicated for disinfecting solutions.    Signed Lamar Blinks, MD

## 2019-09-21 NOTE — Patient Outreach (Signed)
[facility]) Care Management  09/21/2019  EFSTATHIA YAROCH [birth year] [id number]    Unsuccessful call placed to patient regarding patient assistance application(s) for Spiriva Respimat with BI , HIPAA compliant voicemail left.   Was calling patient to inquire if she has received the application but unfortunately she did not answer the phone. HIPAA compliant voicemail left.  Follow up:  Will follow up with 2nd outreach attempt in 5-7 business days.  Debroh Sieloff P. Emmilee Reamer, [place]  [phone]

## 2019-09-22 ENCOUNTER — Ambulatory Visit: Payer: Medicare Other | Admitting: Family Medicine

## 2019-09-27 ENCOUNTER — Other Ambulatory Visit: Payer: Self-pay | Admitting: Family Medicine

## 2019-09-27 DIAGNOSIS — L299 Pruritus, unspecified: Secondary | ICD-10-CM

## 2019-09-30 ENCOUNTER — Other Ambulatory Visit: Payer: Self-pay | Admitting: Pharmacy Technician

## 2019-09-30 NOTE — Patient Outreach (Signed)
[facility]) Care Management  09/30/2019  Destiny Ballard [birth year] [id number]    Unsuccessful call placed to patient regarding patient assistance application(s) for Spiriva respimate with BI , HIPAA compliant voicemail left.   Today was my 2nd unsuccessful outreach call to patient to inquire if she has received the application. HIPAA compliant voicemail was left on mobile and home phones.  Follow up:  Will route note to [place] for case closure and will remove myself from care team. Will gladly re open the case if the applications are mailed back.  Destiny Ballard P. Roe Koffman, [place]  [phone]

## 2019-10-01 ENCOUNTER — Other Ambulatory Visit: Payer: Self-pay | Admitting: *Deleted

## 2019-10-01 NOTE — Patient Outreach (Signed)
[facility]) Care Management  10/01/2019  JAELA COLLER 1950-12-29 [id number]   [place] other MonthOutreach  Referral Date:12/25/2017 Referral Source:Transfer from [place] Reason for Referral:Continued Disease Management Education Insurance:Medicare   Outreach Attempt:  Outreach attempt #1 to patient for follow up. No answer. RN Health Coach left HIPAA compliant voicemail message along with contact information.  Plan:  RN Health Coach will make another outreach attempt within the month of April if no return call back from patient.  [place] [phone] [email or web address]

## 2019-10-07 NOTE — Telephone Encounter (Signed)
error 

## 2019-10-19 ENCOUNTER — Encounter: Payer: Self-pay | Admitting: Internal Medicine

## 2019-10-19 ENCOUNTER — Ambulatory Visit (INDEPENDENT_AMBULATORY_CARE_PROVIDER_SITE_OTHER): Payer: Medicare Other | Admitting: Internal Medicine

## 2019-10-19 ENCOUNTER — Other Ambulatory Visit: Payer: Self-pay

## 2019-10-19 DIAGNOSIS — J440 Chronic obstructive pulmonary disease with acute lower respiratory infection: Secondary | ICD-10-CM

## 2019-10-19 DIAGNOSIS — J209 Acute bronchitis, unspecified: Secondary | ICD-10-CM | POA: Diagnosis not present

## 2019-10-19 MED ORDER — SPIRIVA RESPIMAT 2.5 MCG/ACT IN AERS: 2.0000 | INHALATION_SPRAY | Freq: Every day | RESPIRATORY_TRACT | 3 refills | Status: DC

## 2019-10-19 MED ORDER — ALBUTEROL SULFATE HFA 108 (90 BASE) MCG/ACT IN AERS: 2.0000 | INHALATION_SPRAY | Freq: Three times a day (TID) | RESPIRATORY_TRACT | 0 refills | Status: DC | PRN

## 2019-10-19 MED ORDER — AZITHROMYCIN 250 MG PO TABS: ORAL_TABLET | ORAL | 0 refills | Status: DC

## 2019-10-19 NOTE — Progress Notes (Signed)
Pre visit review using our clinic review tool, if applicable. No additional management support is needed unless otherwise documented below in the visit note. 

## 2019-10-19 NOTE — Progress Notes (Signed)
Subjective:    Patient ID: Destiny Ballard, female    DOB: 04-04-1951, 69 y.o.   MRN: [id number]  DOS:  10/19/2019 Type of visit - description: Virtual Visit via Telephone  Attempted  to make this a video visit, due to technical difficulties from the patient side it was not possible  thus we proceeded with a Virtual Visit via Telephone    I connected with above mentioned patient  by telephone and verified that I am speaking with the correct person using two identifiers.  THIS ENCOUNTER IS A VIRTUAL VISIT DUE TO COVID-19 - PATIENT WAS NOT SEEN IN THE OFFICE. PATIENT HAS CONSENTED TO VIRTUAL VISIT / TELEMEDICINE VISIT   Location of patient: home  Location of provider: office  I discussed the limitations, risks, security and privacy concerns of performing an evaluation and management service by telephone and the availability of in person appointments. I also discussed with the patient that there may be a patient responsible charge related to this service. The patient expressed understanding and agreed to proceed.  Acute I communicated with the patient via telephone at around 4 PM. She just returned from [place] on 10/17/2019 after visiting her family Symptoms started a week ago: Sore throat, cough, chest and sinus congestion.  She has not have her Covid vaccination.    Review of Systems Denies fevers but occasional chills. No chest pain except when she cough No myalgias, some malaise. No nausea, vomiting, diarrhea  Past Medical History:  Diagnosis Date  . Anxiety    Prior suicide attempt  . CAD (coronary artery disease)    a) s/p DES to LAD 07/2005 b) Last Myoview low risk 11/2011 showing small fixed apical perfusion defect (prior MI vs attenuation) but no ischemia - normal EF.  [name] Cervical spondylosis   . Coronary atherosclerosis 06/28/2008  . Depression   . Depression with anxiety   . Diabetes mellitus without complication ([place])   . GERD (gastroesophageal reflux disease)     . Hyperlipidemia   . Hypertension   . Insulin resistance   . Iron deficiency anemia   . Obesity     Past Surgical History:  Procedure Laterality Date  . BREAST ENHANCEMENT SURGERY    . CARDIAC CATHETERIZATION  06/17/2007   NORMAL. EF 60%  . CARDIAC CATHETERIZATION N/A 01/29/2016   Procedure: Left Heart Cath and Coronary Angiography;  Surgeon: Sherren Mocha, MD;  Location: [place] [facility];  Service: Cardiovascular;  Laterality: N/A;  . CERVICAL SPONDYLOSIS     SINGLE LEVEL FUSION  . CHILDBIRTH     X3  . CORONARY STENT PLACEMENT  07/2005   LEFT ANTERIOR DESCENDING  . FOREARM FRACTURE SURGERY  2010   hand and shoulder   . INCISION AND DRAINAGE BREAST ABSCESS  01/05/2012      . INCISION AND DRAINAGE PERIRECTAL ABSCESS N/A 02/18/2014   Procedure: IRRIGATION AND DEBRIDEMENT PERIRECTAL ABSCESS;  Surgeon: Pedro Earls, MD;  Location: [facility];  Service: General;  Laterality: N/A;  . LEFT HEART CATH AND CORONARY ANGIOGRAPHY N/A 10/10/2017   Procedure: LEFT HEART CATH AND CORONARY ANGIOGRAPHY;  Surgeon: Burnell Blanks, MD;  Location: [place] [facility];  Service: Cardiovascular;  Laterality: N/A;  . LUMBAR LAMINECTOMY    . ROTATOR CUFF REPAIR     bilaterla  . TONSILLECTOMY AND ADENOIDECTOMY    . TUBAL LIGATION    . VIDEO BRONCHOSCOPY Bilateral 08/13/2016   Procedure: VIDEO BRONCHOSCOPY WITHOUT FLUORO;  Surgeon: Collene Gobble, MD;  [facility];  Service: Cardiopulmonary;  Laterality: Bilateral;    Allergies as of 10/19/2019      Reactions   Prednisone Other (See Comments)   REACTION: mood swings, nightmares. "Shot doesn't bother me, reaction is just with the pill" she states she has had the steroid injections before. From our records methylprednisone was given to her in 2013 without any complications.      Medication List       Accurate as of October 19, 2019 11:59 PM. If you have any questions, ask your nurse or doctor.        STOP taking these medications    cephALEXin 500 MG capsule Commonly known as: KEFLEX Stopped by: Kathlene November, MD     TAKE these medications   albuterol 108 (90 Base) MCG/ACT inhaler Commonly known as: VENTOLIN HFA Inhale 2 puffs into the lungs every 8 (eight) hours as needed for wheezing or shortness of breath. Started by: Kathlene November, MD   aspirin EC 81 MG tablet Take 81 mg by mouth daily.   azithromycin 250 MG tablet Commonly known as: ZITHROMAX Take 2 tablets by mouth first day, then 1 tablet for 4 additional days Started by: Kathlene November, MD   benzonatate 200 MG capsule Commonly known as: TESSALON TAKE 1 CAPSULE BY MOUTH 3 TIMES A DAY AS NEEDED FOR COUGH   calcium carbonate 500 MG chewable tablet Commonly known as: TUMS - dosed in mg elemental calcium Chew 2 tablets by mouth daily as needed for indigestion or heartburn.   cetirizine 10 MG tablet Commonly known as: ZYRTEC TAKE 1 TABLET BY MOUTH DAILY AS NEEDED FOR ALLERGIES   dapagliflozin propanediol 10 MG Tabs tablet Commonly known as: FARXIGA Take 10 mg by mouth daily.   FLUoxetine 40 MG capsule Commonly known as: PROZAC Take 1 capsule (40 mg total) by mouth daily.   fluticasone 50 MCG/ACT nasal spray Commonly known as: FLONASE SPRAY 2 SPRAYS INTO EACH NOSTRIL EVERY DAY   furosemide 20 MG tablet Commonly known as: LASIX TAKE 1 TABLET BY MOUTH EVERY DAY   gabapentin 300 MG capsule Commonly known as: NEURONTIN TAKE 2 CAPSULES BY MOUTH TWICE A DAY   glipiZIDE 5 MG tablet Commonly known as: GLUCOTROL TAKE 1 TABLET (5 MG TOTAL) BY MOUTH DAILY BEFORE BREAKFAST.   hydrALAZINE 25 MG tablet Commonly known as: APRESOLINE TAKE 2 TABLETS (50 MG TOTAL) BY MOUTH 3 (THREE) TIMES DAILY.   hydrOXYzine 25 MG tablet Commonly known as: ATARAX/VISTARIL TAKE 1/2 TO 1 TABLET BY MOUTH EVERY 8 HOURS AS NEEDED FOR ITCHING   Levemir FlexTouch 100 UNIT/ML FlexPen Generic drug: insulin detemir INJECT 28 UNITS INTO THE SKIN DAILY.-- 53 DAY SUPPLY   losartan 100 MG  tablet Commonly known as: COZAAR Take 1 tablet (100 mg total) by mouth daily.   metFORMIN 500 MG 24 hr tablet Commonly known as: GLUCOPHAGE-XR TAKE 2 TABLETS BY MOUTH TWICE A DAY   methocarbamol 750 MG tablet Commonly known as: ROBAXIN TAKE 1 TABLET (750 MG TOTAL) BY MOUTH EVERY 12 (TWELVE) HOURS AS NEEDED FOR MUSCLE SPASMS.   metoprolol succinate 100 MG 24 hr tablet Commonly known as: TOPROL-XL TAKE 1 TABLET BY MOUTH EVERY DAY   montelukast 10 MG tablet Commonly known as: SINGULAIR TAKE 1 TABLET BY MOUTH EVERY DAY AS NEEDED FOR ALLERGIES   nitroGLYCERIN 0.4 MG SL tablet Commonly known as: NITROSTAT Place 1 tablet (0.4 mg total) under the tongue every 5 (five) minutes as needed for chest pain.  omega-3 acid ethyl esters 1 g capsule Commonly known as: LOVAZA TAKE 1 CAPSULE (1 G TOTAL) BY MOUTH 2 (TWO) TIMES DAILY.   REFRESH OP Place 2 drops into both eyes daily as needed (for dry eyes).   rosuvastatin 20 MG tablet Commonly known as: Crestor Take 1 tablet (20 mg total) by mouth daily.   Spiriva Respimat 2.5 MCG/ACT Aers Generic drug: Tiotropium Bromide Monohydrate Inhale 2 puffs into the lungs daily.   traZODone 50 MG tablet Commonly known as: DESYREL TAKE 1/2 TO 1 TABLET BY MOUTH AT BEDTIME AS NEEDED FOR SLEEP   triamcinolone cream 0.1 % Commonly known as: KENALOG Apply 1 application topically 2 (two) times daily. Use once or twice a day for poison ivy- apply sparingly to face.  Use no longer than 1 week   Vitamin D3 25 MCG (1000 UT) Caps Take 1,000 Units by mouth daily.          Objective:   Physical Exam BP 134/68   Ht 4\' 11"  (1.499 m)   Wt 180 lb (81.6 kg)   BMI 36.36 kg/m  O2 sats not available This is a virtual telephone visit.  She sounded alert oriented x3, in no distress but frequent cough noted.  I can hear large airway congestion and some wheezing    Assessment     69 year old lady, PMH includes HTN, CAD, CHF, OSA COPD, DM, presents  with:  COPD exacerbation SX C/W COPD exacerbation probably due to intercurrent viral or bacterial infection.. She knows the limitations of a telephone assessment. She reports good compliance with the Spiriva. Plan: Recommend to be tested for Covid and let me know the outcome, Covid test contact numbers provided. Continue Spiriva, RF sent Start albuterol 2 puffs every 8 hours as needed Zithromax Robitussin-DM OTC Most important she needs to let us know if is not gradually better.     I discussed the assessment and treatment plan with the patient. The patient was provided an opportunity to ask questions and all were answered. The patient agreed with the plan and demonstrated an understanding of the instructions.   The patient was advised to call back or seek an in-person evaluation if the symptoms worsen or if the condition fails to improve as anticipated.  I provided 22 minutes of non-face-to-face time during this encounter.  Kathlene November, MD

## 2019-10-19 NOTE — Progress Notes (Signed)
Called at 8:50 AM x 2 , LMOM, asked to call back if a visit is a still needed

## 2019-10-20 ENCOUNTER — Other Ambulatory Visit: Payer: Self-pay | Admitting: Pharmacist

## 2019-10-20 ENCOUNTER — Ambulatory Visit: Payer: Medicare Other | Attending: Internal Medicine

## 2019-10-20 ENCOUNTER — Other Ambulatory Visit: Payer: Self-pay | Admitting: Family Medicine

## 2019-10-20 DIAGNOSIS — Z20822 Contact with and (suspected) exposure to covid-19: Secondary | ICD-10-CM

## 2019-10-20 DIAGNOSIS — J302 Other seasonal allergic rhinitis: Secondary | ICD-10-CM

## 2019-10-20 NOTE — Patient Outreach (Signed)
[facility])  [place] Team    [facility] pharmacy case will be closed as our team is transitioning from the [place] Management Department into the [facility] Quality Department and will no longer be using [facility] for documentation purposes.  Patient assistance applications were not returned.  Elayne Guerin, PharmD, [place] Clinical Pharmacist [phone]

## 2019-10-21 ENCOUNTER — Ambulatory Visit: Payer: Self-pay | Admitting: Pharmacist

## 2019-10-22 LAB — SARS-COV-2, NAA 2 DAY TAT

## 2019-10-22 LAB — NOVEL CORONAVIRUS, NAA: SARS-CoV-2, NAA: NOT DETECTED

## 2019-10-25 ENCOUNTER — Ambulatory Visit: Payer: Medicare Other | Admitting: Internal Medicine

## 2019-10-27 ENCOUNTER — Telehealth (INDEPENDENT_AMBULATORY_CARE_PROVIDER_SITE_OTHER): Payer: Medicare Other | Admitting: Family Medicine

## 2019-10-27 ENCOUNTER — Other Ambulatory Visit: Payer: Self-pay

## 2019-10-27 DIAGNOSIS — J209 Acute bronchitis, unspecified: Secondary | ICD-10-CM | POA: Diagnosis not present

## 2019-10-27 MED ORDER — CEFDINIR 300 MG PO CAPS: 300.0000 mg | ORAL_CAPSULE | Freq: Two times a day (BID) | ORAL | 0 refills | Status: DC

## 2019-10-27 NOTE — Progress Notes (Signed)
[place] at [facility] [address] [place], [place] [address] [phone]  Date:  10/27/2019   Name:  Destiny Ballard   DOB:  06-06-1951   MRN:  [id number]  PCP:  Darreld Mclean, MD    Chief Complaint: No chief complaint on file.   History of Present Illness:  Destiny Ballard is a 69 y.o. very pleasant female patient who presents with the following:  Video visit today to discuss concerns of cough and congestion Patient I connected via video call, patient identity is confirmed with 2 factors.  She gives consent for virtual visit today.  The patient and myself are present on the call today Patient with history of CAD, CHF, hypertension, sleep apnea, COPD, diabetes, hyperlipidemia  Her A1c has showed inadequate control of diabetes recently Lab Results  Component Value Date   Destiny Ballard 9.6 (H) 09/09/2019   She was tested for COVID-19 on April 7, negative result Today she notes illness for 3 weeks- cough,chest congestion, headache Some sinus pain on the left. No fever noted  No vomiting or diarrhea  She has tried mucinex, robitusin She did a virtual visit with dr Larose Kells on 4/6- she was given albuterol and a zpack.  She did feel that the Z-Pak helped her some, however she continues to cough even after finishing it She does not feel like she needs to be seen at the ER   Patient Active Problem List   Diagnosis Date Noted  . Obesity (BMI 30-39.9) 08/24/2018  . Depression with anxiety 08/24/2018  . CAD (coronary artery disease) 08/24/2018  . Abnormal cardiovascular stress test 08/24/2018  . Lobar pneumonia, unspecified organism ([place]) 11/13/2017  . Precordial chest pain   . Sepsis ([place]) 09/18/2017  . Chronic diastolic CHF (congestive heart failure) ([place]) 09/18/2017  . Left hip pain 10/28/2016  . Chronic rhinitis 08/07/2016  . COPD with acute exacerbation ([place]) 04/25/2016  . Essential hypertension 01/30/2016  . Hypokalemia  01/27/2016  . HLD (hyperlipidemia)   . Type 2 diabetes mellitus without complication, without long-term current use of insulin ([place])   . OSA (obstructive sleep apnea) 05/06/2014  . Dyspnea on exertion 09/02/2012  . Chest pain 12/10/2011  . Chronic cough 05/11/2010  . GERD 06/28/2008    Past Medical History:  Diagnosis Date  . Anxiety    Prior suicide attempt  . CAD (coronary artery disease)    a) s/p DES to LAD 07/2005 b) Last Myoview low risk 11/2011 showing small fixed apical perfusion defect (prior MI vs attenuation) but no ischemia - normal EF.  Destiny Ballard Cervical spondylosis   . Coronary atherosclerosis 06/28/2008  . Depression   . Depression with anxiety   . Diabetes mellitus without complication ([place])   . GERD (gastroesophageal reflux disease)   . Hyperlipidemia   . Hypertension   . Insulin resistance   . Iron deficiency anemia   . Obesity     Past Surgical History:  Procedure Laterality Date  . BREAST ENHANCEMENT SURGERY    . CARDIAC CATHETERIZATION  06/17/2007   NORMAL. EF 60%  . CARDIAC CATHETERIZATION N/A 01/29/2016   Procedure: Left Heart Cath and Coronary Angiography;  Surgeon: Sherren Mocha, MD;  Location: [place] [facility];  Service: Cardiovascular;  Laterality: N/A;  . CERVICAL SPONDYLOSIS     SINGLE LEVEL FUSION  . CHILDBIRTH     X3  . CORONARY STENT PLACEMENT  07/2005   LEFT ANTERIOR DESCENDING  .  FOREARM FRACTURE SURGERY  2010   hand and shoulder   . INCISION AND DRAINAGE BREAST ABSCESS  01/05/2012      . INCISION AND DRAINAGE PERIRECTAL ABSCESS N/A 02/18/2014   Procedure: IRRIGATION AND DEBRIDEMENT PERIRECTAL ABSCESS;  Surgeon: Pedro Earls, MD;  [facility];  Service: General;  Laterality: N/A;  . LEFT HEART CATH AND CORONARY ANGIOGRAPHY N/A 10/10/2017   Procedure: LEFT HEART CATH AND CORONARY ANGIOGRAPHY;  Surgeon: Burnell Blanks, MD;  Location: [place] [facility];  Service: Cardiovascular;  Laterality: N/A;  . LUMBAR LAMINECTOMY    .  ROTATOR CUFF REPAIR     bilaterla  . TONSILLECTOMY AND ADENOIDECTOMY    . TUBAL LIGATION    . VIDEO BRONCHOSCOPY Bilateral 08/13/2016   Procedure: VIDEO BRONCHOSCOPY WITHOUT FLUORO;  Surgeon: Collene Gobble, MD;  [facility];  Service: Cardiopulmonary;  Laterality: Bilateral;    Social History   Tobacco Use  . Smoking status: Passive Smoke Exposure - Never Smoker  . Smokeless tobacco: Never Used  Substance Use Topics  . Alcohol use: Yes    Comment: occ  . Drug use: No    Family History  Problem Relation Age of Onset  . Heart attack Mother   . Diabetes Mother   . Lung cancer Mother   . Asthma Mother   . Heart disease Mother   . Suicidality Father        "killed himself"  . Asthma Daughter        x 2  . Cancer Daughter        pre-cancerous polyp  . Diabetes Sister   . Cancer Sister   . Cervical cancer Daughter        cervical   . Allergies Other        all family--seasonal allergies    Allergies  Allergen Reactions  . Prednisone Other (See Comments)    REACTION: mood swings, nightmares. "Shot doesn't bother me, reaction is just with the pill" she states she has had the steroid injections before. From our records methylprednisone was given to her in 2013 without any complications.    Medication list has been reviewed and updated.  Current Outpatient Medications on File Prior to Visit  Medication Sig Dispense Refill  . albuterol (VENTOLIN HFA) 108 (90 Base) MCG/ACT inhaler Inhale 2 puffs into the lungs every 8 (eight) hours as needed for wheezing or shortness of breath. 18 g 0  . aspirin EC 81 MG tablet Take 81 mg by mouth daily.    Destiny Ballard azithromycin (ZITHROMAX) 250 MG tablet Take 2 tablets by mouth first day, then 1 tablet for 4 additional days 6 tablet 0  . benzonatate (TESSALON) 200 MG capsule TAKE 1 CAPSULE BY MOUTH 3 TIMES A DAY AS NEEDED FOR COUGH 90 capsule 5  . calcium carbonate (TUMS - DOSED IN MG ELEMENTAL CALCIUM) 500 MG chewable tablet Chew 2  tablets by mouth daily as needed for indigestion or heartburn.    . cetirizine (ZYRTEC) 10 MG tablet TAKE 1 TABLET BY MOUTH DAILY AS NEEDED FOR ALLERGIES 90 tablet 1  . Cholecalciferol (VITAMIN D3) 1000 UNITS CAPS Take 1,000 Units by mouth daily.     . dapagliflozin propanediol (FARXIGA) 10 MG TABS tablet Take 10 mg by mouth daily. 90 tablet 3  . FLUoxetine (PROZAC) 40 MG capsule Take 1 capsule (40 mg total) by mouth daily. 90 capsule 3  . fluticasone (FLONASE) 50 MCG/ACT nasal spray SPRAY 2 SPRAYS INTO EACH NOSTRIL  EVERY DAY (Patient not taking: Reported on 10/19/2019) 48 g 0  . furosemide (LASIX) 20 MG tablet TAKE 1 TABLET BY MOUTH EVERY DAY 90 tablet 2  . gabapentin (NEURONTIN) 300 MG capsule TAKE 2 CAPSULES BY MOUTH TWICE A DAY 360 capsule 1  . glipiZIDE (GLUCOTROL) 5 MG tablet TAKE 1 TABLET (5 MG TOTAL) BY MOUTH DAILY BEFORE BREAKFAST. 90 tablet 1  . hydrALAZINE (APRESOLINE) 25 MG tablet TAKE 2 TABLETS (50 MG TOTAL) BY MOUTH 3 (THREE) TIMES DAILY. 540 tablet 2  . hydrOXYzine (ATARAX/VISTARIL) 25 MG tablet TAKE 1/2 TO 1 TABLET BY MOUTH EVERY 8 HOURS AS NEEDED FOR ITCHING 270 tablet 0  . LEVEMIR FLEXTOUCH 100 UNIT/ML Pen INJECT 28 UNITS INTO THE SKIN DAILY.-- 53 DAY SUPPLY 15 mL 1  . losartan (COZAAR) 100 MG tablet Take 1 tablet (100 mg total) by mouth daily. 90 tablet 3  . metFORMIN (GLUCOPHAGE-XR) 500 MG 24 hr tablet TAKE 2 TABLETS BY MOUTH TWICE A DAY 360 tablet 1  . methocarbamol (ROBAXIN) 750 MG tablet TAKE 1 TABLET (750 MG TOTAL) BY MOUTH EVERY 12 (TWELVE) HOURS AS NEEDED FOR MUSCLE SPASMS. 60 tablet 3  . metoprolol succinate (TOPROL-XL) 100 MG 24 hr tablet TAKE 1 TABLET BY MOUTH EVERY DAY 90 tablet 3  . montelukast (SINGULAIR) 10 MG tablet TAKE 1 TABLET BY MOUTH EVERY DAY AS NEEDED FOR ALLERGIES 90 tablet 1  . nitroGLYCERIN (NITROSTAT) 0.4 MG SL tablet Place 1 tablet (0.4 mg total) under the tongue every 5 (five) minutes as needed for chest pain. (Patient not taking: Reported on 10/19/2019) 20  tablet 3  . omega-3 acid ethyl esters (LOVAZA) 1 g capsule TAKE 1 CAPSULE (1 G TOTAL) BY MOUTH 2 (TWO) TIMES DAILY. (Patient not taking: Reported on 10/19/2019) 180 capsule 1  . Polyvinyl Alcohol-Povidone (REFRESH OP) Place 2 drops into both eyes daily as needed (for dry eyes).    . rosuvastatin (CRESTOR) 20 MG tablet Take 1 tablet (20 mg total) by mouth daily. 90 tablet 3  . Tiotropium Bromide Monohydrate (SPIRIVA RESPIMAT) 2.5 MCG/ACT AERS Inhale 2 puffs into the lungs daily. 4 g 3  . traZODone (DESYREL) 50 MG tablet TAKE 1/2 TO 1 TABLET BY MOUTH AT BEDTIME AS NEEDED FOR SLEEP 90 tablet 1  . triamcinolone cream (KENALOG) 0.1 % Apply 1 application topically 2 (two) times daily. Use once or twice a day for poison ivy- apply sparingly to face.  Use no longer than 1 week (Patient not taking: Reported on 10/19/2019) 30 g 0   No current facility-administered medications on file prior to visit.    Review of Systems:  As per HPI- otherwise negative.   Physical Examination: There were no vitals filed for this visit. There were no vitals filed for this visit. There is no height or weight on file to calculate BMI. Ideal Body Weight:    Connected with pt via video -she looks well, her normal self No wheeze, cough or distress is noted  Glucose this am was 96 she lookshe has an appt with endocrinology scheduled in one week Reports that her BP has been under control at home She is not checking her oxygen or pulse  Assessment and Plan: Acute bronchitis, unspecified organism - Plan: cefdinir (OMNICEF) 300 MG capsule  Pt is seen virtually today for concern of persistent cough and sinus symtpoms She was seen for a similar issue by Dr Larose Kells a week ago, treated with azithromycin and albuterol This helped somewhat, but she notes that  her cough persisted after finishing azithromycin She does not feel toxic, discussed the ER but she does not feel this is necessary I prescribed Omnicef, instructed her to let me  know if not feeling better over the next couple of days, sooner if worse.  She agrees to do so  Moderate medical decision making today  Signed Lamar Blinks, MD

## 2019-10-29 ENCOUNTER — Telehealth: Payer: Self-pay

## 2019-10-29 NOTE — Telephone Encounter (Signed)
Patient was told to call in Per Dr. Lorelei Pont if the current medication she is on is not helping her sinus infection. The patient would like to speak with the nurse or Dr. Lorelei Pont about this issue. Please call the patient as soon as possible at [phone]

## 2019-11-01 NOTE — Telephone Encounter (Signed)
Called her back-she has been sick since early this month with apparent bronchitis versus sinus infection.  She can treat with azithromycin also Omnicef-she is still taking Omnicef She notes itching, sneezing, coughing  She had a negative COVID-19 test on April 7 She wonders if her symptoms may be due to allergies  Lab Results  Component Value Date   HGBA1C 9.6 (H) 09/09/2019   I made an appointment to see me in the office on Wednesday, as she had a recent negative COVID-19

## 2019-11-01 NOTE — Telephone Encounter (Signed)
Patient was given zithromax on 10/19/2019 and omnicef o n4/14/2021

## 2019-11-02 ENCOUNTER — Other Ambulatory Visit: Payer: Self-pay

## 2019-11-02 NOTE — Progress Notes (Signed)
[facility] [place], [place], [place] [address] [phone]  Date:  11/03/2019   Name:  Destiny Ballard   DOB:  09/19/1950   MRN:  [id number]  PCP:  Darreld Mclean, MD    Chief Complaint: Sinusitis (chest congstion, cough, chills, shortness of breath)   History of Present Illness:  Destiny Ballard is a 69 y.o. very pleasant female patient who presents with the following:  Pt with history of CAD, HTN, CHF, OSA, COPD, DM, GERD, HLD Following up today with concern of allergies Seen virtually twice on 4/6  byt Dr Larose Kells- given azithromycin for possible COPD exacerbation -she was also tested for COVID-19 at that time and was negative Seen again by myself on 4/14- given Omnicef She called on 4/19 with concern of continued illness.  At this point she has been sick for 1 month, she tested negative for COVID-19 on 4/7; between these 2 factors contagious COVID-19 is unlikely so we brought her in for evaluation  Lab Results  Component Value Date   HGBA1C 9.6 (H) 09/09/2019   Encourage COVID-19 immunization when she is able  At this point she really feels like her sx are in her sinuses- she notes some ear pain and pressure Esp the left ear is bothersome No fever noted She got some diarrhea from abx but otherwise ok as far as GI symptoms She feels ok during the day, but she is not sleeping well as she cannot breathe through her nose She is having PND especially at night She is sleeping in a chair since [name] died- she prefers not to go into her bedroom to sleep any longer   She is using flonase nasal spray but no nasal decongestant  Pt notes that oral pred gives her bad nightmares and mood swings.  She understands that any steroid may increase her glucose -however she has used a steroid injection in the past and tolerated okay  She checks her glucose at home prn  She does have insulin that she is using    Patient  Active Problem List   Diagnosis Date Noted  . Obesity (BMI 30-39.9) 08/24/2018  . Depression with anxiety 08/24/2018  . CAD (coronary artery disease) 08/24/2018  . Abnormal cardiovascular stress test 08/24/2018  . Lobar pneumonia, unspecified organism ([place]) 11/13/2017  . Precordial chest pain   . Sepsis ([place]) 09/18/2017  . Chronic diastolic CHF (congestive heart failure) ([place]) 09/18/2017  . Left hip pain 10/28/2016  . Chronic rhinitis 08/07/2016  . COPD with acute exacerbation ([place]) 04/25/2016  . Essential hypertension 01/30/2016  . Hypokalemia 01/27/2016  . HLD (hyperlipidemia)   . Type 2 diabetes mellitus without complication, without long-term current use of insulin ([place])   . OSA (obstructive sleep apnea) 05/06/2014  . Dyspnea on exertion 09/02/2012  . Chest pain 12/10/2011  . Chronic cough 05/11/2010  . GERD 06/28/2008    Past Medical History:  Diagnosis Date  . Anxiety    Prior suicide attempt  . CAD (coronary artery disease)    a) s/p DES to LAD 07/2005 b) Last Myoview low risk 11/2011 showing small fixed apical perfusion defect (prior MI vs attenuation) but no ischemia - normal EF.  [name] Cervical spondylosis   . Coronary atherosclerosis 06/28/2008  . Depression   . Depression with anxiety   . Diabetes mellitus without complication ([place])   . GERD (gastroesophageal reflux disease)   . Hyperlipidemia   .  Hypertension   . Insulin resistance   . Iron deficiency anemia   . Obesity     Past Surgical History:  Procedure Laterality Date  . BREAST ENHANCEMENT SURGERY    . CARDIAC CATHETERIZATION  06/17/2007   NORMAL. EF 60%  . CARDIAC CATHETERIZATION N/A 01/29/2016   Procedure: Left Heart Cath and Coronary Angiography;  Surgeon: Sherren Mocha, MD;  Location: [facility];  Service: Cardiovascular;  Laterality: N/A;  . CERVICAL SPONDYLOSIS     SINGLE LEVEL FUSION  . CHILDBIRTH     X3  . CORONARY STENT PLACEMENT  07/2005   LEFT ANTERIOR DESCENDING  . FOREARM  FRACTURE SURGERY  2010   hand and shoulder   . INCISION AND DRAINAGE BREAST ABSCESS  01/05/2012      . INCISION AND DRAINAGE PERIRECTAL ABSCESS N/A 02/18/2014   Procedure: IRRIGATION AND DEBRIDEMENT PERIRECTAL ABSCESS;  Surgeon: Pedro Earls, MD;  Location: [facility];  Service: General;  Laterality: N/A;  . LEFT HEART CATH AND CORONARY ANGIOGRAPHY N/A 10/10/2017   Procedure: LEFT HEART CATH AND CORONARY ANGIOGRAPHY;  Surgeon: Burnell Blanks, MD;  Location: [facility];  Service: Cardiovascular;  Laterality: N/A;  . LUMBAR LAMINECTOMY    . ROTATOR CUFF REPAIR     bilaterla  . TONSILLECTOMY AND ADENOIDECTOMY    . TUBAL LIGATION    . VIDEO BRONCHOSCOPY Bilateral 08/13/2016   Procedure: VIDEO BRONCHOSCOPY WITHOUT FLUORO;  Surgeon: Collene Gobble, MD;  Location: [facility];  Service: Cardiopulmonary;  Laterality: Bilateral;    Social History   Tobacco Use  . Smoking status: Passive Smoke Exposure - Never Smoker  . Smokeless tobacco: Never Used  Substance Use Topics  . Alcohol use: Yes    Comment: occ  . Drug use: No    Family History  Problem Relation Age of Onset  . Heart attack Mother   . Diabetes Mother   . Lung cancer Mother   . Asthma Mother   . Heart disease Mother   . Suicidality Father        "killed himself"  . Asthma Daughter        x 2  . Cancer Daughter        pre-cancerous polyp  . Diabetes Sister   . Cancer Sister   . Cervical cancer Daughter        cervical   . Allergies Other        all family--seasonal allergies    Allergies  Allergen Reactions  . Prednisone Other (See Comments)    REACTION: mood swings, nightmares. "Shot doesn't bother me, reaction is just with the pill" she states she has had the steroid injections before. From our records methylprednisone was given to her in 2013 without any complications.    Medication list has been reviewed and updated.  Current Outpatient Medications on File Prior to Visit  Medication Sig  Dispense Refill  . albuterol (VENTOLIN HFA) 108 (90 Base) MCG/ACT inhaler Inhale 2 puffs into the lungs every 8 (eight) hours as needed for wheezing or shortness of breath. 18 g 0  . aspirin EC 81 MG tablet Take 81 mg by mouth daily.    [name] azithromycin (ZITHROMAX) 250 MG tablet Take 2 tablets by mouth first day, then 1 tablet for 4 additional days 6 tablet 0  . benzonatate (TESSALON) 200 MG capsule TAKE 1 CAPSULE BY MOUTH 3 TIMES A DAY AS NEEDED FOR COUGH 90 capsule 5  . calcium carbonate (TUMS - DOSED  IN MG ELEMENTAL CALCIUM) 500 MG chewable tablet Chew 2 tablets by mouth daily as needed for indigestion or heartburn.    . cefdinir (OMNICEF) 300 MG capsule Take 1 capsule (300 mg total) by mouth 2 (two) times daily. 20 capsule 0  . cetirizine (ZYRTEC) 10 MG tablet TAKE 1 TABLET BY MOUTH DAILY AS NEEDED FOR ALLERGIES 90 tablet 1  . Cholecalciferol (VITAMIN D3) 1000 UNITS CAPS Take 1,000 Units by mouth daily.     . dapagliflozin propanediol (FARXIGA) 10 MG TABS tablet Take 10 mg by mouth daily. 90 tablet 3  . FLUoxetine (PROZAC) 40 MG capsule Take 1 capsule (40 mg total) by mouth daily. 90 capsule 3  . fluticasone (FLONASE) 50 MCG/ACT nasal spray SPRAY 2 SPRAYS INTO EACH NOSTRIL EVERY DAY (Patient not taking: Reported on 10/19/2019) 48 g 0  . furosemide (LASIX) 20 MG tablet TAKE 1 TABLET BY MOUTH EVERY DAY 90 tablet 2  . gabapentin (NEURONTIN) 300 MG capsule TAKE 2 CAPSULES BY MOUTH TWICE A DAY 360 capsule 1  . glipiZIDE (GLUCOTROL) 5 MG tablet TAKE 1 TABLET (5 MG TOTAL) BY MOUTH DAILY BEFORE BREAKFAST. 90 tablet 1  . hydrALAZINE (APRESOLINE) 25 MG tablet TAKE 2 TABLETS (50 MG TOTAL) BY MOUTH 3 (THREE) TIMES DAILY. 540 tablet 2  . hydrOXYzine (ATARAX/VISTARIL) 25 MG tablet TAKE 1/2 TO 1 TABLET BY MOUTH EVERY 8 HOURS AS NEEDED FOR ITCHING 270 tablet 0  . LEVEMIR FLEXTOUCH 100 UNIT/ML Pen INJECT 28 UNITS INTO THE SKIN DAILY.-- 53 DAY SUPPLY 15 mL 1  . losartan (COZAAR) 100 MG tablet Take 1 tablet (100 mg  total) by mouth daily. 90 tablet 3  . metFORMIN (GLUCOPHAGE-XR) 500 MG 24 hr tablet TAKE 2 TABLETS BY MOUTH TWICE A DAY 360 tablet 1  . methocarbamol (ROBAXIN) 750 MG tablet TAKE 1 TABLET (750 MG TOTAL) BY MOUTH EVERY 12 (TWELVE) HOURS AS NEEDED FOR MUSCLE SPASMS. 60 tablet 3  . metoprolol succinate (TOPROL-XL) 100 MG 24 hr tablet TAKE 1 TABLET BY MOUTH EVERY DAY 90 tablet 3  . montelukast (SINGULAIR) 10 MG tablet TAKE 1 TABLET BY MOUTH EVERY DAY AS NEEDED FOR ALLERGIES 90 tablet 1  . nitroGLYCERIN (NITROSTAT) 0.4 MG SL tablet Place 1 tablet (0.4 mg total) under the tongue every 5 (five) minutes as needed for chest pain. (Patient not taking: Reported on 10/19/2019) 20 tablet 3  . omega-3 acid ethyl esters (LOVAZA) 1 g capsule TAKE 1 CAPSULE (1 G TOTAL) BY MOUTH 2 (TWO) TIMES DAILY. (Patient not taking: Reported on 10/19/2019) 180 capsule 1  . Polyvinyl Alcohol-Povidone (REFRESH OP) Place 2 drops into both eyes daily as needed (for dry eyes).    . rosuvastatin (CRESTOR) 20 MG tablet Take 1 tablet (20 mg total) by mouth daily. 90 tablet 3  . Tiotropium Bromide Monohydrate (SPIRIVA RESPIMAT) 2.5 MCG/ACT AERS Inhale 2 puffs into the lungs daily. 4 g 3  . traZODone (DESYREL) 50 MG tablet TAKE 1/2 TO 1 TABLET BY MOUTH AT BEDTIME AS NEEDED FOR SLEEP 90 tablet 1  . triamcinolone cream (KENALOG) 0.1 % Apply 1 application topically 2 (two) times daily. Use once or twice a day for poison ivy- apply sparingly to face.  Use no longer than 1 week (Patient not taking: Reported on 10/19/2019) 30 g 0   No current facility-administered medications on file prior to visit.    Review of Systems:  As per HPI- otherwise negative.   Physical Examination: Vitals:   11/03/19 [id number] 11/03/19 [id number]  BP: (!) 183/91 (!) 145/75  Pulse: 91   Resp: 20   Temp: (!) 97.1 F (36.2 C)   SpO2: 93%    Vitals:   11/03/19 0903  Weight: 182 lb (82.6 kg)  Height: 4\' 11"  (1.499 m)   Body mass index is 36.76 kg/m. Ideal Body Weight:  Weight in (lb) to have BMI = 25: 123.5  GEN: no acute distress.  Obese, otherwise appears well HEENT: Atraumatic, Normocephalic.   Bilateral TM wnl, oropharynx normal.  PEERL,EOMI. her ear canals are not inflamed, no cerumen impaction.  Her ear canals do appear somewhat damp, there may be an element of otitis externa Ears and Nose: No external deformity. CV: RRR, No M/G/R. No JVD. No thrill. No extra heart sounds. PULM: CTA B, no wheezes, crackles, rhonchi. No retractions. No resp. distress. No accessory muscle use. ABD: S, NT, ND, +BS. No rebound. No HSM. EXTR: No c/c/e PSYCH: Normally interactive. Conversant.    Assessment and Plan: COPD exacerbation ([place]) - Plan: methylPREDNISolone acetate (DEPO-MEDROL) injection 40 mg  Seasonal allergic rhinitis due to pollen - Plan: methylPREDNISolone acetate (DEPO-MEDROL) injection 40 mg  Discomfort of both ears - Plan: acetic acid-hydrocortisone (VOSOL-HC) OTIC solution  Patient is here today with persistent congestion, sinus pain and pressure, cough for 1 month.  She has been treated with 2 different antibiotics and tested negative for COVID-19 At this point we will give her a shot of Depo-Medrol for presumed COPD exacerbation and possibly sinusitis/allergic rhinitis I cautioned her that her blood sugar will likely go up, she will monitor this and let me know if any severe increase.  If this occurs we can adjust her insulin I given her acetic acid hydrocortisone drops to use for your symptoms Her allergy to prednisone was mood swings and nightmares, she is done okay with Depo-Medrol in the past and topical hydrocortisone should not be an issue She is asked to let me know if not feeling better in the next few days, sooner if worse Moderate medical decision making today  This visit occurred during the SARS-CoV-2 public health emergency.  Safety protocols were in place, including screening questions prior to the visit, additional usage of staff PPE,  and extensive cleaning of exam room while observing appropriate contact time as indicated for disinfecting solutions.    Signed Lamar Blinks, MD

## 2019-11-03 ENCOUNTER — Encounter: Payer: Self-pay | Admitting: Family Medicine

## 2019-11-03 ENCOUNTER — Ambulatory Visit: Payer: Medicare Other | Admitting: Internal Medicine

## 2019-11-03 ENCOUNTER — Other Ambulatory Visit: Payer: Self-pay

## 2019-11-03 ENCOUNTER — Ambulatory Visit (INDEPENDENT_AMBULATORY_CARE_PROVIDER_SITE_OTHER): Payer: Medicare Other | Admitting: Family Medicine

## 2019-11-03 ENCOUNTER — Other Ambulatory Visit: Payer: Self-pay | Admitting: *Deleted

## 2019-11-03 VITALS — BP 145/75 | HR 91 | Temp 97.1°F | Resp 20 | Ht 59.0 in | Wt 182.0 lb

## 2019-11-03 DIAGNOSIS — H9203 Otalgia, bilateral: Secondary | ICD-10-CM | POA: Diagnosis not present

## 2019-11-03 DIAGNOSIS — J441 Chronic obstructive pulmonary disease with (acute) exacerbation: Secondary | ICD-10-CM | POA: Diagnosis not present

## 2019-11-03 DIAGNOSIS — J301 Allergic rhinitis due to pollen: Secondary | ICD-10-CM | POA: Diagnosis not present

## 2019-11-03 MED ORDER — METHYLPREDNISOLONE ACETATE 40 MG/ML IJ SUSP
40.0000 mg | Freq: Once | INTRAMUSCULAR | Status: AC
  Administered 2019-11-03: 40 mg via INTRAMUSCULAR

## 2019-11-03 MED ORDER — HYDROCORTISONE-ACETIC ACID 1-2 % OT SOLN: 3.0000 [drp] | Freq: Three times a day (TID) | OTIC | 0 refills | Status: DC

## 2019-11-03 NOTE — Patient Outreach (Signed)
[facility]) Care Management  11/03/2019  JAMARRIA JUBB 03-Jun-1951 [id number]   [place] other MonthOutreach  Referral Date:12/25/2017 Referral Source:Transfer from [place] Reason for Referral:Continued Disease Management Education Insurance:Medicare   Outreach Attempt:  Outreach attempt #2 to patient for follow up. No answer.  RN Health Coach left HIPAA compliant voicemail message along with contact information.  Plan:  RN Health Coach will make another outreach attempt within the month of May if no return call back from patient.  [place] [phone] [email or web address]

## 2019-11-03 NOTE — Patient Instructions (Signed)
Good to see you today- we gave you a shot of steroids.  Please watch your glucose carefully over the next week or so- let me know if going higher than 300 or so and we can adjust your insulin.  I would also suggest that you use an OTC nasal decongestant spray such as Afrin for a few days- use before you go to sleep, it will help you breathe  Please let me know how you are feeling over the next several days

## 2019-11-09 ENCOUNTER — Telehealth: Payer: Self-pay | Admitting: Family Medicine

## 2019-11-09 ENCOUNTER — Telehealth: Payer: Self-pay

## 2019-11-09 DIAGNOSIS — H9203 Otalgia, bilateral: Secondary | ICD-10-CM

## 2019-11-09 NOTE — Telephone Encounter (Signed)
Patient called in to see if Dr. Lorelei Pont can send in a referral to Dr. Wilburn Cornelia ENT office as soon as possible. Please follow back up with the patient at [phone]

## 2019-11-09 NOTE — Telephone Encounter (Signed)
Caller : Roxana Luke Call Back # [phone]  Subject : Referral    Patient is requesting a referral to a ENT due to continuing  ear pain.   Please advise

## 2019-11-09 NOTE — Telephone Encounter (Signed)
ENT referral placed today.

## 2019-11-09 NOTE — Telephone Encounter (Signed)
Referral done

## 2019-11-10 ENCOUNTER — Other Ambulatory Visit: Payer: Self-pay | Admitting: *Deleted

## 2019-11-10 NOTE — Patient Outreach (Signed)
[facility]) Care Management  11/10/2019  DEQUANA PELKY 1950-11-01 [id number]   [place] other MonthOutreach  Referral Date:12/25/2017 Referral Source:Transfer from [place] Reason for Referral:Continued Disease Management Education Insurance:Medicare   Outreach Attempt:  Outreach attempt #3 to patient for follow up. No answer. RN Health Coach left HIPAA compliant voicemail message along with contact information.  Plan:  RN Health Coach will send unsuccessful outreach letter to patient.  RN Health Coach will make another outreach attempt to patient within the month of May if no return call back from patient.  [place] [phone] [email or web address]

## 2019-11-12 ENCOUNTER — Other Ambulatory Visit: Payer: Self-pay | Admitting: Internal Medicine

## 2019-11-14 ENCOUNTER — Other Ambulatory Visit: Payer: Self-pay | Admitting: Nurse Practitioner

## 2019-11-16 ENCOUNTER — Telehealth: Payer: Self-pay | Admitting: Family

## 2019-11-16 ENCOUNTER — Inpatient Hospital Stay (HOSPITAL_BASED_OUTPATIENT_CLINIC_OR_DEPARTMENT_OTHER): Payer: Medicare Other | Admitting: Family

## 2019-11-16 ENCOUNTER — Inpatient Hospital Stay: Payer: Medicare Other | Attending: Family

## 2019-11-16 ENCOUNTER — Encounter: Payer: Self-pay | Admitting: Family

## 2019-11-16 ENCOUNTER — Other Ambulatory Visit: Payer: Self-pay

## 2019-11-16 VITALS — BP 165/78 | HR 84 | Temp 97.5°F | Resp 18 | Ht 59.0 in | Wt 180.0 lb

## 2019-11-16 DIAGNOSIS — D72829 Elevated white blood cell count, unspecified: Secondary | ICD-10-CM | POA: Insufficient documentation

## 2019-11-16 DIAGNOSIS — Z794 Long term (current) use of insulin: Secondary | ICD-10-CM | POA: Diagnosis not present

## 2019-11-16 DIAGNOSIS — Z79899 Other long term (current) drug therapy: Secondary | ICD-10-CM | POA: Diagnosis not present

## 2019-11-16 DIAGNOSIS — E1165 Type 2 diabetes mellitus with hyperglycemia: Secondary | ICD-10-CM | POA: Diagnosis not present

## 2019-11-16 LAB — CBC WITH DIFFERENTIAL (CANCER CENTER ONLY)
Abs Immature Granulocytes: 0.06 10*3/uL (ref 0.00–0.07)
Basophils Absolute: 0 10*3/uL (ref 0.0–0.1)
Basophils Relative: 0 %
Eosinophils Absolute: 0.3 10*3/uL (ref 0.0–0.5)
Eosinophils Relative: 3 %
HCT: 40.2 % (ref 36.0–46.0)
Hemoglobin: 12.5 g/dL (ref 12.0–15.0)
Immature Granulocytes: 1 %
Lymphocytes Relative: 21 %
Lymphs Abs: 2.6 10*3/uL (ref 0.7–4.0)
MCH: 24.7 pg — ABNORMAL LOW (ref 26.0–34.0)
MCHC: 31.1 g/dL (ref 30.0–36.0)
MCV: 79.4 fL — ABNORMAL LOW (ref 80.0–100.0)
Monocytes Absolute: 1 10*3/uL (ref 0.1–1.0)
Monocytes Relative: 8 %
Neutro Abs: 8.8 10*3/uL — ABNORMAL HIGH (ref 1.7–7.7)
Neutrophils Relative %: 67 %
Platelet Count: 283 10*3/uL (ref 150–400)
RBC: 5.06 MIL/uL (ref 3.87–5.11)
RDW: 14.2 % (ref 11.5–15.5)
WBC Count: 12.9 10*3/uL — ABNORMAL HIGH (ref 4.0–10.5)
nRBC: 0 % (ref 0.0–0.2)

## 2019-11-16 LAB — CMP (CANCER CENTER ONLY)
ALT: 14 U/L (ref 0–44)
AST: 14 U/L — ABNORMAL LOW (ref 15–41)
Albumin: 4.1 g/dL (ref 3.5–5.0)
Alkaline Phosphatase: 73 U/L (ref 38–126)
Anion gap: 9 (ref 5–15)
BUN: 16 mg/dL (ref 8–23)
CO2: 28 mmol/L (ref 22–32)
Calcium: 9.5 mg/dL (ref 8.9–10.3)
Chloride: 101 mmol/L (ref 98–111)
Creatinine: 0.73 mg/dL (ref 0.44–1.00)
GFR, Est AFR Am: >60 mL/min (ref >60)
GFR, Estimated: >60 mL/min (ref >60)
Glucose, Bld: 109 mg/dL — ABNORMAL HIGH (ref 70–99)
Potassium: 4.1 mmol/L (ref 3.5–5.1)
Sodium: 138 mmol/L (ref 135–145)
Total Bilirubin: 0.3 mg/dL (ref 0.3–1.2)
Total Protein: 7.5 g/dL (ref 6.5–8.1)

## 2019-11-16 LAB — SAVE SMEAR(SSMR), FOR PROVIDER SLIDE REVIEW

## 2019-11-16 NOTE — Telephone Encounter (Signed)
No follow-up per 5/4 los

## 2019-11-16 NOTE — Progress Notes (Signed)
Hematology and Oncology Follow Up Visit  Destiny Ballard [id number] 06/03/1951 69 y.o. 11/16/2019   Principle Diagnosis:  Mild leukocytosis likely reactive - uncontrolled diabetes and recent fall resulting in fractured 7th left rib  Current Therapy: Observation   Interim History:  Destiny Ballard is here today for follow-up. She is doing well and has had no issue with infections.  Her WBC count remains stable at 12.9.  No fever, chills, n/v, cough, rash, dizziness, SOB, chest pain, palpitations, abdominal pain or changes in bowel or bladder habits.  No bleeding, bruising or petechiae.  No swelling, tenderness, numbness or tingling in her extremities.  No falls or syncope.  She is eating well and staying hydrated. Her weight is stable.  Blood sugars are still not well controlled. Hgb A1c in February was 9.6.  She enjoys rehabilitating sweet little squirrels.   ECOG Performance Status: 0 - Asymptomatic  Medications:  Allergies as of 11/16/2019      Reactions   Prednisone Other (See Comments)   REACTION: mood swings, nightmares. "Shot doesn't bother me, reaction is just with the pill" she states she has had the steroid injections before. From our records methylprednisone was given to her in 2013 without any complications.      Medication List       Accurate as of Nov 16, 2019 10:29 AM. If you have any questions, ask your nurse or doctor.        acetic acid-hydrocortisone OTIC solution Commonly known as: VOSOL-HC Place 3 drops into both ears 3 (three) times daily. Use as needed for ear discomfort   albuterol 108 (90 Base) MCG/ACT inhaler Commonly known as: VENTOLIN HFA INHALE 2 PUFFS INTO THE LUNGS EVERY 8 (EIGHT) HOURS AS NEEDED FOR WHEEZING OR SHORTNESS OF BREATH   aspirin EC 81 MG tablet Take 81 mg by mouth daily.   azithromycin 250 MG tablet Commonly known as: ZITHROMAX Take 2 tablets by mouth first day, then 1 tablet for 4 additional days   benzonatate 200 MG  capsule Commonly known as: TESSALON TAKE 1 CAPSULE BY MOUTH 3 TIMES A DAY AS NEEDED FOR COUGH   calcium carbonate 500 MG chewable tablet Commonly known as: TUMS - dosed in mg elemental calcium Chew 2 tablets by mouth daily as needed for indigestion or heartburn.   cefdinir 300 MG capsule Commonly known as: OMNICEF Take 1 capsule (300 mg total) by mouth 2 (two) times daily.   cetirizine 10 MG tablet Commonly known as: ZYRTEC TAKE 1 TABLET BY MOUTH DAILY AS NEEDED FOR ALLERGIES   dapagliflozin propanediol 10 MG Tabs tablet Commonly known as: FARXIGA Take 10 mg by mouth daily.   FLUoxetine 40 MG capsule Commonly known as: PROZAC Take 1 capsule (40 mg total) by mouth daily.   fluticasone 50 MCG/ACT nasal spray Commonly known as: FLONASE SPRAY 2 SPRAYS INTO EACH NOSTRIL EVERY DAY   furosemide 20 MG tablet Commonly known as: LASIX TAKE 1 TABLET BY MOUTH EVERY DAY   gabapentin 300 MG capsule Commonly known as: NEURONTIN TAKE 2 CAPSULES BY MOUTH TWICE A DAY   glipiZIDE 5 MG tablet Commonly known as: GLUCOTROL TAKE 1 TABLET (5 MG TOTAL) BY MOUTH DAILY BEFORE BREAKFAST.   hydrALAZINE 25 MG tablet Commonly known as: APRESOLINE TAKE 2 TABLETS BY MOUTH 3 TIMES A DAY   hydrOXYzine 25 MG tablet Commonly known as: ATARAX/VISTARIL TAKE 1/2 TO 1 TABLET BY MOUTH EVERY 8 HOURS AS NEEDED FOR ITCHING   Levemir FlexTouch 100 UNIT/ML FlexPen Generic  drug: insulin detemir INJECT 28 UNITS INTO THE SKIN DAILY.-- 53 DAY SUPPLY   losartan 100 MG tablet Commonly known as: COZAAR Take 1 tablet (100 mg total) by mouth daily.   metFORMIN 500 MG 24 hr tablet Commonly known as: GLUCOPHAGE-XR TAKE 2 TABLETS BY MOUTH TWICE A DAY   methocarbamol 750 MG tablet Commonly known as: ROBAXIN TAKE 1 TABLET (750 MG TOTAL) BY MOUTH EVERY 12 (TWELVE) HOURS AS NEEDED FOR MUSCLE SPASMS.   metoprolol succinate 100 MG 24 hr tablet Commonly known as: TOPROL-XL TAKE 1 TABLET BY MOUTH EVERY DAY    montelukast 10 MG tablet Commonly known as: SINGULAIR TAKE 1 TABLET BY MOUTH EVERY DAY AS NEEDED FOR ALLERGIES   nitroGLYCERIN 0.4 MG SL tablet Commonly known as: NITROSTAT Place 1 tablet (0.4 mg total) under the tongue every 5 (five) minutes as needed for chest pain.   omega-3 acid ethyl esters 1 g capsule Commonly known as: LOVAZA TAKE 1 CAPSULE (1 G TOTAL) BY MOUTH 2 (TWO) TIMES DAILY.   REFRESH OP Place 2 drops into both eyes daily as needed (for dry eyes).   rosuvastatin 20 MG tablet Commonly known as: Crestor Take 1 tablet (20 mg total) by mouth daily.   Spiriva Respimat 2.5 MCG/ACT Aers Generic drug: Tiotropium Bromide Monohydrate Inhale 2 puffs into the lungs daily.   traZODone 50 MG tablet Commonly known as: DESYREL TAKE 1/2 TO 1 TABLET BY MOUTH AT BEDTIME AS NEEDED FOR SLEEP   triamcinolone cream 0.1 % Commonly known as: KENALOG Apply 1 application topically 2 (two) times daily. Use once or twice a day for poison ivy- apply sparingly to face.  Use no longer than 1 week   Vitamin D3 25 MCG (1000 UT) Caps Take 1,000 Units by mouth daily.       Allergies:  Allergies  Allergen Reactions  . Prednisone Other (See Comments)    REACTION: mood swings, nightmares. "Shot doesn't bother me, reaction is just with the pill" she states she has had the steroid injections before. From our records methylprednisone was given to her in 2013 without any complications.    Past Medical History, Surgical history, Social history, and Family History were reviewed and updated.  Review of Systems: All other 10 point review of systems is negative.   Physical Exam:  height is 5\' 4"  (1.626 m) and weight is 180 lb (81.6 kg). Her temporal temperature is 97.5 F (36.4 C) (abnormal). Her blood pressure is 165/78 (abnormal) and her pulse is 84. Her respiration is 18 and oxygen saturation is 95%.   Wt Readings from Last 3 Encounters:  11/16/19 180 lb (81.6 kg)  11/03/19 182 lb (82.6 kg)   10/19/19 180 lb (81.6 kg)    Ocular: Sclerae unicteric, pupils equal, round and reactive to light Ear-nose-throat: Oropharynx clear, dentition fair Lymphatic: No cervical or supraclavicular adenopathy Lungs no rales or rhonchi, good excursion bilaterally Heart regular rate and rhythm, no murmur appreciated Abd soft, nontender, positive bowel sounds, no liver or spleen tip palpated on exam, no fluid wave  MSK no focal spinal tenderness, no joint edema Neuro: non-focal, well-oriented, appropriate affect Breasts: Deferred   Lab Results  Component Value Date   WBC 12.9 (H) 11/16/2019   HGB 12.5 11/16/2019   HCT 40.2 11/16/2019   MCV 79.4 (L) 11/16/2019   PLT 283 11/16/2019   Lab Results  Component Value Date   FERRITIN 65 04/23/2012   Lab Results  Component Value Date   RBC 5.06 11/16/2019  No results found for: KPAFRELGTCHN, LAMBDASER, KAPLAMBRATIO No results found for: IGGSERUM, IGA, IGMSERUM No results found for: Odetta Pink, SPEI   Chemistry      Component Value Date/Time   NA 141 05/18/2019 1046   NA 137 01/04/2019 1514   K 3.8 05/18/2019 1046   CL 99 05/18/2019 1046   CO2 31 05/18/2019 1046   BUN 11 05/18/2019 1046   BUN 15 01/04/2019 1514   CREATININE 0.83 05/18/2019 1046   CREATININE 0.65 03/01/2016 1458      Component Value Date/Time   CALCIUM 10.1 05/18/2019 1046   ALKPHOS 92 05/18/2019 1046   AST 15 05/18/2019 1046   ALT 14 05/18/2019 1046   BILITOT 0.4 05/18/2019 1046       Impression and Plan: Destiny Ballard is a very pleasant 69 yo caucasian female with a history of mild leukocytosis felt to be reactive secondary to uncontrolled diabetes.  Her counts remain stable and she is asymptomatic.  At this point we do not need to see her back in our office.  She will contact us with any questions or concerns. We can certainly see her for any future heme/onc needs.   Laverna Peace, NP 5/4/202110:29  AM

## 2019-11-18 DIAGNOSIS — H6983 Other specified disorders of Eustachian tube, bilateral: Secondary | ICD-10-CM

## 2019-11-18 DIAGNOSIS — M26622 Arthralgia of left temporomandibular joint: Secondary | ICD-10-CM | POA: Diagnosis not present

## 2019-11-18 DIAGNOSIS — H906 Mixed conductive and sensorineural hearing loss, bilateral: Secondary | ICD-10-CM

## 2019-11-18 DIAGNOSIS — Z9089 Acquired absence of other organs: Secondary | ICD-10-CM | POA: Diagnosis not present

## 2019-11-18 DIAGNOSIS — H6993 Unspecified Eustachian tube disorder, bilateral: Secondary | ICD-10-CM

## 2019-11-18 DIAGNOSIS — M2669 Other specified disorders of temporomandibular joint: Secondary | ICD-10-CM

## 2019-11-18 DIAGNOSIS — H608X3 Other otitis externa, bilateral: Secondary | ICD-10-CM

## 2019-11-18 DIAGNOSIS — H9313 Tinnitus, bilateral: Secondary | ICD-10-CM

## 2019-11-18 HISTORY — DX: Unspecified eustachian tube disorder, bilateral: H69.93

## 2019-11-18 HISTORY — DX: Tinnitus, bilateral: H93.13

## 2019-11-18 HISTORY — DX: Other specified disorders of eustachian tube, bilateral: H69.83

## 2019-11-18 HISTORY — DX: Mixed conductive and sensorineural hearing loss, bilateral: H90.6

## 2019-11-18 HISTORY — DX: Other specified disorders of temporomandibular joint: M26.69

## 2019-11-18 HISTORY — DX: Other otitis externa, bilateral: H60.8X3

## 2019-12-08 ENCOUNTER — Other Ambulatory Visit: Payer: Self-pay

## 2019-12-08 ENCOUNTER — Other Ambulatory Visit: Payer: Self-pay | Admitting: *Deleted

## 2019-12-08 NOTE — Patient Outreach (Signed)
[facility]) Care Management  12/08/2019  [name] 1950/11/23 [id number]   [place] other MonthOutreach  Referral Date:12/25/2017 Referral Source:Transfer from [place] Reason for Referral:Continued Disease Management Education Insurance:Medicare   Outreach Attempt:  Outreach attempt #4 to patient for follow up. No answer. RN Health Coach left HIPAA compliant voicemail message along with contact information.  Plan:  RN Health Coach will make another outreach attempt within the month of June if no return call back from patient.  [place] [phone] [name]

## 2019-12-10 ENCOUNTER — Encounter: Payer: Self-pay | Admitting: Internal Medicine

## 2019-12-10 ENCOUNTER — Other Ambulatory Visit: Payer: Self-pay | Admitting: Family Medicine

## 2019-12-10 ENCOUNTER — Ambulatory Visit (INDEPENDENT_AMBULATORY_CARE_PROVIDER_SITE_OTHER): Payer: Medicare Other | Admitting: Internal Medicine

## 2019-12-10 ENCOUNTER — Other Ambulatory Visit: Payer: Self-pay

## 2019-12-10 VITALS — BP 134/68 | HR 77 | Temp 97.8°F | Ht 59.0 in | Wt 181.8 lb

## 2019-12-10 DIAGNOSIS — Z794 Long term (current) use of insulin: Secondary | ICD-10-CM | POA: Insufficient documentation

## 2019-12-10 DIAGNOSIS — E1159 Type 2 diabetes mellitus with other circulatory complications: Secondary | ICD-10-CM | POA: Diagnosis not present

## 2019-12-10 DIAGNOSIS — E1142 Type 2 diabetes mellitus with diabetic polyneuropathy: Secondary | ICD-10-CM | POA: Diagnosis not present

## 2019-12-10 DIAGNOSIS — E1165 Type 2 diabetes mellitus with hyperglycemia: Secondary | ICD-10-CM | POA: Insufficient documentation

## 2019-12-10 DIAGNOSIS — I10 Essential (primary) hypertension: Secondary | ICD-10-CM

## 2019-12-10 HISTORY — DX: Type 2 diabetes mellitus with diabetic polyneuropathy: E11.42

## 2019-12-10 HISTORY — DX: Type 2 diabetes mellitus with hyperglycemia: E11.65

## 2019-12-10 HISTORY — DX: Long term (current) use of insulin: Z79.4

## 2019-12-10 LAB — POCT GLYCOSYLATED HEMOGLOBIN (HGB A1C): Hemoglobin A1C: 7.8 % — AB (ref 4.0–5.6)

## 2019-12-10 MED ORDER — INSULIN PEN NEEDLE 32G X 4 MM MISC: 1.0000 | 11 refills | Status: DC

## 2019-12-10 MED ORDER — METFORMIN HCL ER 500 MG PO TB24: 1000.0000 mg | ORAL_TABLET | Freq: Two times a day (BID) | ORAL | 3 refills | Status: DC

## 2019-12-10 MED ORDER — DAPAGLIFLOZIN PROPANEDIOL 10 MG PO TABS: 10.0000 mg | ORAL_TABLET | Freq: Every day | ORAL | 3 refills | Status: DC

## 2019-12-10 MED ORDER — BASAGLAR KWIKPEN 100 UNIT/ML ~~LOC~~ SOPN: 24.0000 [IU] | PEN_INJECTOR | Freq: Every day | SUBCUTANEOUS | 11 refills | Status: DC

## 2019-12-10 NOTE — Patient Instructions (Addendum)
-   STOP Glipizide - STOP Levemir - Metformin 500 mg , 2 tablet twice daily  - Restart Farxiga 10 mg , 1 tablet daily in the morning  - Start Basaglar 24 units daily      HOW TO TREAT LOW BLOOD SUGARS (Blood sugar LESS THAN 70 MG/DL)  Please follow the RULE OF 15 for the treatment of hypoglycemia treatment (when your (blood sugars are less than 70 mg/dL)    STEP 1: Take 15 grams of carbohydrates when your blood sugar is low, which includes:   3-4 GLUCOSE TABS  OR  3-4 OZ OF JUICE OR REGULAR SODA OR  ONE TUBE OF GLUCOSE GEL     STEP 2: RECHECK blood sugar in 15 MINUTES STEP 3: If your blood sugar is still low at the 15 minute recheck --> then, go back to STEP 1 and treat AGAIN with another 15 grams of carbohydrates.

## 2019-12-10 NOTE — Progress Notes (Signed)
Name: Destiny Ballard  MRN/ DOB: [id number], 1950/11/28   Age/ Sex: 69 y.o., female    PCP: Copland, Gay Filler, MD   Reason for Endocrinology Evaluation: Type 2 Diabetes Mellitus     Date of Initial Endocrinology Visit: 12/10/2019     PATIENT IDENTIFIER: Destiny Ballard is a 69 y.o. female with a past medical history of T2Dm, dyslipidemia, CAD , OSA and HTN. The patient presented for initial endocrinology clinic visit on 12/10/2019 for consultative assistance with her diabetes management.    HPI: Ms. Morua was    Diagnosed with DM many years ago Prior Medications tried/Intolerance: does not recall  Currently checking blood sugars 1 x / day,  She has the freestyle libre but has been using the finger stick as the sensors as expensive  Hypoglycemia episodes : no Hemoglobin A1c has ranged from 6.2% in 2013, peaking at 10.1% in 2020. Patient required assistance for hypoglycemia: no Patient has required hospitalization within the last 1 year from hyper or hypoglycemia: no  In terms of diet, the patient 1 meal a day but snacks, has been drinking sugar-free drinks    HOME DIABETES REGIMEN: Metformin 500 mg XR, 2 tabs BID Farxiga 10 mg daily - last pick up 04/2019 per pharmacy Glipizide 5 mg, BID Levemir 28 units daily    Statin: yes ACE-I/ARB: yes Prior Diabetic Education: no   METER DOWNLOAD SUMMARY: Unable to download CGM   DIABETIC COMPLICATIONS: Microvascular complications:   Neuropathy   Denies: CKD, retinopathy   Last eye exam: has an eye exam for 02/2020  Macrovascular complications:   CAD (S/P stent placement)   Denies: PVD, CVA   PAST HISTORY: Past Medical History:  Past Medical History:  Diagnosis Date  . Anxiety    Prior suicide attempt  . CAD (coronary artery disease)    a) s/p DES to LAD 07/2005 b) Last Myoview low risk 11/2011 showing small fixed apical perfusion defect (prior MI vs attenuation) but no ischemia - normal EF.  [name] Cervical  spondylosis   . Coronary atherosclerosis 06/28/2008  . Depression   . Depression with anxiety   . Diabetes mellitus without complication ([place])   . GERD (gastroesophageal reflux disease)   . Hyperlipidemia   . Hypertension   . Insulin resistance   . Iron deficiency anemia   . Obesity    Past Surgical History:  Past Surgical History:  Procedure Laterality Date  . BREAST ENHANCEMENT SURGERY    . CARDIAC CATHETERIZATION  06/17/2007   NORMAL. EF 60%  . CARDIAC CATHETERIZATION N/A 01/29/2016   Procedure: Left Heart Cath and Coronary Angiography;  Surgeon: Sherren Mocha, MD;  Location: [facility];  Service: Cardiovascular;  Laterality: N/A;  . CERVICAL SPONDYLOSIS     SINGLE LEVEL FUSION  . CHILDBIRTH     X3  . CORONARY STENT PLACEMENT  07/2005   LEFT ANTERIOR DESCENDING  . FOREARM FRACTURE SURGERY  2010   hand and shoulder   . INCISION AND DRAINAGE BREAST ABSCESS  01/05/2012      . INCISION AND DRAINAGE PERIRECTAL ABSCESS N/A 02/18/2014   Procedure: IRRIGATION AND DEBRIDEMENT PERIRECTAL ABSCESS;  Surgeon: Pedro Earls, MD;  Location: [facility];  Service: General;  Laterality: N/A;  . LEFT HEART CATH AND CORONARY ANGIOGRAPHY N/A 10/10/2017   Procedure: LEFT HEART CATH AND CORONARY ANGIOGRAPHY;  Surgeon: Burnell Blanks, MD;  Location: [facility];  Service: Cardiovascular;  Laterality: N/A;  . LUMBAR LAMINECTOMY    .  ROTATOR CUFF REPAIR     bilaterla  . TONSILLECTOMY AND ADENOIDECTOMY    . TUBAL LIGATION    . VIDEO BRONCHOSCOPY Bilateral 08/13/2016   Procedure: VIDEO BRONCHOSCOPY WITHOUT FLUORO;  Surgeon: Collene Gobble, MD;  Location: [facility];  Service: Cardiopulmonary;  Laterality: Bilateral;      Social History:  reports that she is a non-smoker but has been exposed to tobacco smoke. She has never used smokeless tobacco. She reports current alcohol use. She reports that she does not use drugs. Family History:  Family History  Problem Relation Age of  Onset  . Heart attack Mother   . Diabetes Mother   . Lung cancer Mother   . Asthma Mother   . Heart disease Mother   . Suicidality Father        "killed himself"  . Asthma Daughter        x 2  . Cancer Daughter        pre-cancerous polyp  . Diabetes Sister   . Cancer Sister   . Cervical cancer Daughter        cervical   . Allergies Other        all family--seasonal allergies     HOME MEDICATIONS: Allergies as of 12/10/2019      Reactions   Prednisone Other (See Comments)   REACTION: mood swings, nightmares. "Shot doesn't bother me, reaction is just with the pill" she states she has had the steroid injections before. From our records methylprednisone was given to her in 2013 without any complications.      Medication List       Accurate as of Dec 10, 2019  9:32 AM. If you have any questions, ask your nurse or doctor.        acetic acid-hydrocortisone OTIC solution Commonly known as: VOSOL-HC Place 3 drops into both ears 3 (three) times daily. Use as needed for ear discomfort   albuterol 108 (90 Base) MCG/ACT inhaler Commonly known as: VENTOLIN HFA INHALE 2 PUFFS INTO THE LUNGS EVERY 8 (EIGHT) HOURS AS NEEDED FOR WHEEZING OR SHORTNESS OF BREATH   aspirin EC 81 MG tablet Take 81 mg by mouth daily.   azithromycin 250 MG tablet Commonly known as: ZITHROMAX Take 2 tablets by mouth first day, then 1 tablet for 4 additional days   benzonatate 200 MG capsule Commonly known as: TESSALON TAKE 1 CAPSULE BY MOUTH 3 TIMES A DAY AS NEEDED FOR COUGH   calcium carbonate 500 MG chewable tablet Commonly known as: TUMS - dosed in mg elemental calcium Chew 2 tablets by mouth daily as needed for indigestion or heartburn.   cefdinir 300 MG capsule Commonly known as: OMNICEF Take 1 capsule (300 mg total) by mouth 2 (two) times daily.   cetirizine 10 MG tablet Commonly known as: ZYRTEC TAKE 1 TABLET BY MOUTH DAILY AS NEEDED FOR ALLERGIES   dapagliflozin propanediol 10 MG Tabs  tablet Commonly known as: FARXIGA Take 10 mg by mouth daily.   FLUoxetine 40 MG capsule Commonly known as: PROZAC Take 1 capsule (40 mg total) by mouth daily.   fluticasone 50 MCG/ACT nasal spray Commonly known as: FLONASE SPRAY 2 SPRAYS INTO EACH NOSTRIL EVERY DAY   furosemide 20 MG tablet Commonly known as: LASIX TAKE 1 TABLET BY MOUTH EVERY DAY   gabapentin 300 MG capsule Commonly known as: NEURONTIN TAKE 2 CAPSULES BY MOUTH TWICE A DAY   glipiZIDE 5 MG tablet Commonly known as: GLUCOTROL TAKE 1 TABLET (5 MG  TOTAL) BY MOUTH DAILY BEFORE BREAKFAST. What changed: See the new instructions.   hydrALAZINE 25 MG tablet Commonly known as: APRESOLINE TAKE 2 TABLETS BY MOUTH 3 TIMES A DAY   hydrOXYzine 25 MG tablet Commonly known as: ATARAX/VISTARIL TAKE 1/2 TO 1 TABLET BY MOUTH EVERY 8 HOURS AS NEEDED FOR ITCHING   Levemir FlexTouch 100 UNIT/ML FlexPen Generic drug: insulin detemir INJECT 28 UNITS INTO THE SKIN DAILY.-- 53 DAY SUPPLY   losartan 100 MG tablet Commonly known as: COZAAR Take 1 tablet (100 mg total) by mouth daily.   metFORMIN 500 MG 24 hr tablet Commonly known as: GLUCOPHAGE-XR TAKE 2 TABLETS BY MOUTH TWICE A DAY   methocarbamol 750 MG tablet Commonly known as: ROBAXIN TAKE 1 TABLET (750 MG TOTAL) BY MOUTH EVERY 12 (TWELVE) HOURS AS NEEDED FOR MUSCLE SPASMS.   metoprolol succinate 100 MG 24 hr tablet Commonly known as: TOPROL-XL TAKE 1 TABLET BY MOUTH EVERY DAY   montelukast 10 MG tablet Commonly known as: SINGULAIR TAKE 1 TABLET BY MOUTH EVERY DAY AS NEEDED FOR ALLERGIES   nitroGLYCERIN 0.4 MG SL tablet Commonly known as: NITROSTAT Place 1 tablet (0.4 mg total) under the tongue every 5 (five) minutes as needed for chest pain.   omega-3 acid ethyl esters 1 g capsule Commonly known as: LOVAZA TAKE 1 CAPSULE (1 G TOTAL) BY MOUTH 2 (TWO) TIMES DAILY.   REFRESH OP Place 2 drops into both eyes daily as needed (for dry eyes).   rosuvastatin 20 MG  tablet Commonly known as: Crestor Take 1 tablet (20 mg total) by mouth daily.   Spiriva Respimat 2.5 MCG/ACT Aers Generic drug: Tiotropium Bromide Monohydrate Inhale 2 puffs into the lungs daily.   traZODone 50 MG tablet Commonly known as: DESYREL TAKE 1/2 TO 1 TABLET BY MOUTH AT BEDTIME AS NEEDED FOR SLEEP   triamcinolone cream 0.1 % Commonly known as: KENALOG Apply 1 application topically 2 (two) times daily. Use once or twice a day for poison ivy- apply sparingly to face.  Use no longer than 1 week   Vitamin D3 25 MCG (1000 UT) Caps Take 1,000 Units by mouth daily.        ALLERGIES: Allergies  Allergen Reactions  . Prednisone Other (See Comments)    REACTION: mood swings, nightmares. "Shot doesn't bother me, reaction is just with the pill" she states she has had the steroid injections before. From our records methylprednisone was given to her in 2013 without any complications.     REVIEW OF SYSTEMS: A comprehensive ROS was conducted with the patient and is negative except as per HPI and below:  Review of Systems  Cardiovascular: Positive for leg swelling.  Gastrointestinal: Negative for nausea.  Musculoskeletal: Positive for joint pain.  Neurological: Positive for tingling.      OBJECTIVE:   VITAL SIGNS: BP 134/68 (BP Location: Right Arm, Patient Position: Sitting, Cuff Size: Large)   Pulse 77   Temp 97.8 F (36.6 C)   Ht 4\' 11"  (1.499 m)   Wt 181 lb 12.8 oz (82.5 kg)   SpO2 95%   BMI 36.72 kg/m    PHYSICAL EXAM:  General: Pt appears well and is in NAD  Neck: General: Supple without adenopathy or carotid bruits. Thyroid: Thyroid size normal.  No goiter or nodules appreciated. No thyroid bruit.  Lungs: Clear with good BS bilat with no rales, rhonchi, or wheezes  Heart: RRR with normal S1 and S2 and no gallops; no murmurs; no rub  Abdomen: Normoactive  bowel sounds, soft, nontender, without masses or organomegaly palpable  Extremities:  Lower extremities -  No pretibial edema. No lesions.  Skin: Normal texture and temperature to palpation.  Neuro: MS is good with appropriate affect, pt is alert and Ox3    DM foot exam: 12/10/2019  The skin of the feet is intact without sores or ulcerations. The pedal pulses are 2+ on right and 2+ on left. The sensation is decreased  to a screening 5.07, 10 gram monofilament bilaterally   DATA REVIEWED:  Lab Results  Component Value Date   HGBA1C 7.8 (A) 12/10/2019   HGBA1C 9.6 (H) 09/09/2019   HGBA1C 9.9 (H) 04/29/2019   Lab Results  Component Value Date   MICROALBUR 7.8 (H) 01/05/2018   [place] Comment 05/19/2018   CREATININE 0.73 11/16/2019   Lab Results  Component Value Date   MICRALBCREAT 7.5 01/05/2018    Lab Results  Component Value Date   CHOL 145 09/09/2019   HDL 42.60 09/09/2019   [place] Comment 05/19/2018   LDLDIRECT 64.0 09/09/2019   TRIG 363.0 (H) 09/09/2019   CHOLHDL 3 09/09/2019        ASSESSMENT / PLAN / RECOMMENDATIONS:   1) Type 2 Diabetes Mellitus, Sub-Optimally controlled, With Neuropathic, and macrovascular complications - Most recent A1c of 7.8 %. Goal A1c < 7.0 %.   Plan: GENERAL: I have discussed with the patient the pathophysiology of diabetes. We went over the natural progression of the disease. We talked about both insulin resistance and insulin deficiency. We stressed the importance of lifestyle changes including diet and exercise. I explained the complications associated with diabetes including retinopathy, nephropathy, neuropathy as well as increased risk of cardiovascular disease. We went over the benefit seen with glycemic control.    I explained to the patient that diabetic patients are at higher than normal risk for amputations.   Her main barrier to diabetes self care is memory issues.   She had ran out of farxiga with the last pharmacy pick up was 04/2019 will restart that as she does not recall having any side effects to it  Will stop Glipizide,  as she is already on insulin, and this would increase her risk of hypoglycemia  I am also going to switch levemir to Basaglar due to better half life   Pt also tends to skip insulin if her BG is " normal" we discussed the pharmacokinetics of long acting insulin and the importance of consistent intake   MEDICATIONS: - STOP Glipizide - STOP Levemir - Metformin 500 mg , 2 tablet twice daily  - Restart Farxiga 10 mg , 1 tablet daily in the morning  - Start Basaglar 24 units daily     EDUCATION / INSTRUCTIONS:  BG monitoring instructions: Patient is instructed to check her blood sugars 1 times a day.  Call [facility] if: BG persistently < 70 . [name] I reviewed the Rule of 15 for the treatment of hypoglycemia in detail with the patient. Literature supplied.   2) Diabetic complications:   Eye: Does not have known diabetic retinopathy.   Neuro/ Feet: Does  have known diabetic peripheral neuropathy.  Renal: Patient does not have known baseline CKD. She is on an ACEI/ARB at present.Check urine albumin/creatinine ratio yearly starting at time of diagnosis.   3) Dyslipidemia: Patient is on crestor. LDL at goal at 64 mg/dL. Discussed cardiovascular benefits     F/U in 3 months    Signed electronically by: Mack Guise, MD  [facility]  [facility] [address] [place], [place] [address] Phone: [phone] FAX: [phone]   CC: Darre[address] Mclean, [address] [place] [address] [place] [address] Phone: [phone]  Fax: [phone]    Return to Endocrinology clinic as below: Future Appointments  Date Time Provider [place]  01/06/2020 11:00 AM Leona Singleton, RN [facility] None

## 2019-12-18 ENCOUNTER — Other Ambulatory Visit: Payer: Self-pay

## 2019-12-18 ENCOUNTER — Emergency Department (HOSPITAL_BASED_OUTPATIENT_CLINIC_OR_DEPARTMENT_OTHER)
Admission: EM | Admit: 2019-12-18 | Discharge: 2019-12-18 | Disposition: A | Discharge status: Home / Self Care | Payer: Medicare Other | Attending: Emergency Medicine | Admitting: Emergency Medicine

## 2019-12-18 ENCOUNTER — Encounter (HOSPITAL_BASED_OUTPATIENT_CLINIC_OR_DEPARTMENT_OTHER): Payer: Self-pay | Admitting: Emergency Medicine

## 2019-12-18 DIAGNOSIS — E876 Hypokalemia: Secondary | ICD-10-CM | POA: Diagnosis not present

## 2019-12-18 DIAGNOSIS — N390 Urinary tract infection, site not specified: Secondary | ICD-10-CM | POA: Diagnosis not present

## 2019-12-18 DIAGNOSIS — Z79899 Other long term (current) drug therapy: Secondary | ICD-10-CM | POA: Insufficient documentation

## 2019-12-18 DIAGNOSIS — E119 Type 2 diabetes mellitus without complications: Secondary | ICD-10-CM | POA: Diagnosis not present

## 2019-12-18 DIAGNOSIS — Z794 Long term (current) use of insulin: Secondary | ICD-10-CM | POA: Insufficient documentation

## 2019-12-18 DIAGNOSIS — Z955 Presence of coronary angioplasty implant and graft: Secondary | ICD-10-CM | POA: Insufficient documentation

## 2019-12-18 DIAGNOSIS — T63791A Toxic effect of contact with other venomous plant, accidental (unintentional), initial encounter: Secondary | ICD-10-CM | POA: Insufficient documentation

## 2019-12-18 DIAGNOSIS — L237 Allergic contact dermatitis due to plants, except food: Secondary | ICD-10-CM

## 2019-12-18 DIAGNOSIS — R1030 Lower abdominal pain, unspecified: Secondary | ICD-10-CM | POA: Insufficient documentation

## 2019-12-18 DIAGNOSIS — I110 Hypertensive heart disease with heart failure: Secondary | ICD-10-CM | POA: Insufficient documentation

## 2019-12-18 DIAGNOSIS — I5032 Chronic diastolic (congestive) heart failure: Secondary | ICD-10-CM | POA: Insufficient documentation

## 2019-12-18 DIAGNOSIS — I2510 Atherosclerotic heart disease of native coronary artery without angina pectoris: Secondary | ICD-10-CM | POA: Diagnosis not present

## 2019-12-18 DIAGNOSIS — R21 Rash and other nonspecific skin eruption: Secondary | ICD-10-CM | POA: Diagnosis present

## 2019-12-18 LAB — URINALYSIS, ROUTINE W REFLEX MICROSCOPIC
Bilirubin Urine: NEGATIVE
Glucose, UA: >=500 mg/dL — AB
Hgb urine dipstick: NEGATIVE
Ketones, ur: NEGATIVE mg/dL
Nitrite: NEGATIVE
Protein, ur: NEGATIVE mg/dL
Specific Gravity, Urine: 1.02 (ref 1.005–1.030)
pH: 6.5 (ref 5.0–8.0)

## 2019-12-18 LAB — COMPREHENSIVE METABOLIC PANEL
ALT: 18 U/L (ref 0–44)
AST: 19 U/L (ref 15–41)
Albumin: 4 g/dL (ref 3.5–5.0)
Alkaline Phosphatase: 75 U/L (ref 38–126)
Anion gap: 13 (ref 5–15)
BUN: 13 mg/dL (ref 8–23)
CO2: 27 mmol/L (ref 22–32)
Calcium: 9.3 mg/dL (ref 8.9–10.3)
Chloride: 96 mmol/L — ABNORMAL LOW (ref 98–111)
Creatinine, Ser: 0.62 mg/dL (ref 0.44–1.00)
GFR calc Af Amer: >60 mL/min (ref >60)
GFR calc non Af Amer: >60 mL/min (ref >60)
Glucose, Bld: 111 mg/dL — ABNORMAL HIGH (ref 70–99)
Potassium: 3.3 mmol/L — ABNORMAL LOW (ref 3.5–5.1)
Sodium: 136 mmol/L (ref 135–145)
Total Bilirubin: 0.7 mg/dL (ref 0.3–1.2)
Total Protein: 8.2 g/dL — ABNORMAL HIGH (ref 6.5–8.1)

## 2019-12-18 LAB — URINALYSIS, MICROSCOPIC (REFLEX)

## 2019-12-18 LAB — CBC
HCT: 42.7 % (ref 36.0–46.0)
Hemoglobin: 13.3 g/dL (ref 12.0–15.0)
MCH: 25 pg — ABNORMAL LOW (ref 26.0–34.0)
MCHC: 31.1 g/dL (ref 30.0–36.0)
MCV: 80.3 fL (ref 80.0–100.0)
Platelets: 284 10*3/uL (ref 150–400)
RBC: 5.32 MIL/uL — ABNORMAL HIGH (ref 3.87–5.11)
RDW: 14.4 % (ref 11.5–15.5)
WBC: 9.6 10*3/uL (ref 4.0–10.5)
nRBC: 0 % (ref 0.0–0.2)

## 2019-12-18 LAB — CBG MONITORING, ED
Glucose-Capillary: 106 mg/dL — ABNORMAL HIGH (ref 70–99)
Glucose-Capillary: 103 mg/dL — ABNORMAL HIGH (ref 70–99)

## 2019-12-18 LAB — LIPASE, BLOOD: Lipase: 38 U/L (ref 11–51)

## 2019-12-18 MED ORDER — DEXAMETHASONE SODIUM PHOSPHATE 10 MG/ML IJ SOLN
10.0000 mg | Freq: Once | INTRAMUSCULAR | Status: AC
  Administered 2019-12-18: 10 mg via INTRAMUSCULAR
  Filled 2019-12-18: qty 1

## 2019-12-18 MED ORDER — CEPHALEXIN 250 MG PO CAPS
500.0000 mg | ORAL_CAPSULE | Freq: Once | ORAL | Status: AC
  Administered 2019-12-18: 500 mg via ORAL
  Filled 2019-12-18: qty 2

## 2019-12-18 MED ORDER — POTASSIUM CHLORIDE CRYS ER 20 MEQ PO TBCR
40.0000 meq | EXTENDED_RELEASE_TABLET | Freq: Once | ORAL | Status: AC
  Administered 2019-12-18: 40 meq via ORAL
  Filled 2019-12-18: qty 2

## 2019-12-18 MED ORDER — CEPHALEXIN 500 MG PO CAPS: 500.0000 mg | ORAL_CAPSULE | Freq: Two times a day (BID) | ORAL | 0 refills | Status: DC

## 2019-12-18 MED ORDER — ACETAMINOPHEN 500 MG PO TABS
1000.0000 mg | ORAL_TABLET | Freq: Once | ORAL | Status: AC
  Administered 2019-12-18: 1000 mg via ORAL
  Filled 2019-12-18: qty 2

## 2019-12-18 NOTE — ED Provider Notes (Signed)
[facility] EMERGENCY DEPARTMENT Provider Note   CSN: [id number] Arrival date & time: 12/18/19  1418     History Chief Complaint  Patient presents with  . Nausea    Destiny Ballard is a 69 y.o. female.  Patient is a 69 year old female who presents with poison oak rash.  She states she has a pet squirrel who runs around a tree that has poison oak and she acquired poison oak about a week ago.  She has had a lot of itching to her arms and her chest since that time.  She requests a steroid shot which she says usually helps with the poison oak.  She also says she has been a little shaky and anxious lately.  This causes her to have some intermittent nausea.  No vomiting or diarrhea.  No associate abdominal pain other than she has a little discomfort over her bladder.  No fevers.  No definite urinary symptoms.  She says her sugars have been a little bit lower than they normally are because her doctor changed her insulin.  It has been running in the 100s which is low for her.  Otherwise she doesn't have any complaints and wants to get a shot and go home.        Past Medical History:  Diagnosis Date  . Anxiety    Prior suicide attempt  . CAD (coronary artery disease)    a) s/p DES to LAD 07/2005 b) Last Myoview low risk 11/2011 showing small fixed apical perfusion defect (prior MI vs attenuation) but no ischemia - normal EF.  [name] Cervical spondylosis   . Coronary atherosclerosis 06/28/2008  . Depression   . Depression with anxiety   . Diabetes mellitus without complication ([place])   . GERD (gastroesophageal reflux disease)   . Hyperlipidemia   . Hypertension   . Insulin resistance   . Iron deficiency anemia   . Obesity     Patient Active Problem List   Diagnosis Date Noted  . Type 2 diabetes mellitus with hyperglycemia, with long-term current use of insulin ([place]) 12/10/2019  . Type 2 diabetes mellitus with diabetic polyneuropathy, with long-term current use of insulin ([place])  12/10/2019  . Obesity (BMI 30-39.9) 08/24/2018  . Depression with anxiety 08/24/2018  . CAD (coronary artery disease) 08/24/2018  . Abnormal cardiovascular stress test 08/24/2018  . Lobar pneumonia, unspecified organism ([place]) 11/13/2017  . Precordial chest pain   . Sepsis ([place]) 09/18/2017  . Chronic diastolic CHF (congestive heart failure) ([place]) 09/18/2017  . Diabetes mellitus ([place]) 09/08/2017  . Left hip pain 10/28/2016  . Chronic rhinitis 08/07/2016  . COPD with acute exacerbation ([place]) 04/25/2016  . Essential hypertension 01/30/2016  . Hypokalemia 01/27/2016  . HLD (hyperlipidemia)   . Type 2 diabetes mellitus without complication, without long-term current use of insulin ([place])   . OSA (obstructive sleep apnea) 05/06/2014  . Dyspnea on exertion 09/02/2012  . Chest pain 12/10/2011  . Chronic cough 05/11/2010  . GERD 06/28/2008    Past Surgical History:  Procedure Laterality Date  . BREAST ENHANCEMENT SURGERY    . CARDIAC CATHETERIZATION  06/17/2007   NORMAL. EF 60%  . CARDIAC CATHETERIZATION N/A 01/29/2016   Procedure: Left Heart Cath and Coronary Angiography;  Surgeon: Sherren Mocha, MD;  Location: [facility];  Service: Cardiovascular;  Laterality: N/A;  . CERVICAL SPONDYLOSIS     SINGLE LEVEL FUSION  . CHILDBIRTH     X3  . CORONARY STENT PLACEMENT  07/2005  LEFT ANTERIOR DESCENDING  . FOREARM FRACTURE SURGERY  2010   hand and shoulder   . INCISION AND DRAINAGE BREAST ABSCESS  01/05/2012      . INCISION AND DRAINAGE PERIRECTAL ABSCESS N/A 02/18/2014   Procedure: IRRIGATION AND DEBRIDEMENT PERIRECTAL ABSCESS;  Surgeon: Pedro Earls, MD;  Location: [facility];  Service: General;  Laterality: N/A;  . LEFT HEART CATH AND CORONARY ANGIOGRAPHY N/A 10/10/2017   Procedure: LEFT HEART CATH AND CORONARY ANGIOGRAPHY;  Surgeon: Burnell Blanks, MD;  Location: [facility];  Service: Cardiovascular;  Laterality: N/A;  . LUMBAR LAMINECTOMY    . ROTATOR CUFF REPAIR      [place]  . TONSILLECTOMY AND ADENOIDECTOMY    . TUBAL LIGATION    . VIDEO BRONCHOSCOPY Bilateral 08/13/2016   Procedure: VIDEO BRONCHOSCOPY WITHOUT FLUORO;  Surgeon: Collene Gobble, MD;  Location: [facility];  Service: Cardiopulmonary;  Laterality: Bilateral;     OB History   No obstetric history on file.     Family History  Problem Relation Age of Onset  . Heart attack Mother   . Diabetes Mother   . Lung cancer Mother   . Asthma Mother   . Heart disease Mother   . Suicidality Father        "killed himself"  . Asthma Daughter        x 2  . Cancer Daughter        pre-cancerous polyp  . Diabetes Sister   . Cancer Sister   . Cervical cancer Daughter        cervical   . Allergies Other        all family--seasonal allergies    Social History   Tobacco Use  . Smoking status: Passive Smoke Exposure - Never Smoker  . Smokeless tobacco: Never Used  Substance Use Topics  . Alcohol use: Yes    Comment: occ  . Drug use: No    Home Medications Prior to Admission medications   Medication Sig Start Date End Date Taking? Authorizing Provider  acetic acid-hydrocortisone (VOSOL-HC) OTIC solution Place 3 drops into both ears 3 (three) times daily. Use as needed for ear discomfort 11/03/19   Copland, Gay Filler, MD  albuterol (VENTOLIN HFA) 108 (90 Base) MCG/ACT inhaler INHALE 2 PUFFS INTO THE LUNGS EVERY 8 (EIGHT) HOURS AS NEEDED FOR WHEEZING OR SHORTNESS OF BREATH 11/15/19   Copland, Gay Filler, MD  aspirin EC 81 MG tablet Take 81 mg by mouth daily.    [provider]  azithromycin (ZITHROMAX) 250 MG tablet Take 2 tablets by mouth first day, then 1 tablet for 4 additional days 10/19/19   Colon Branch, MD  benzonatate (TESSALON) 200 MG capsule TAKE 1 CAPSULE BY MOUTH 3 TIMES A DAY AS NEEDED FOR COUGH 12/15/18   Copland, Gay Filler, MD  calcium carbonate (TUMS - DOSED IN MG ELEMENTAL CALCIUM) 500 MG chewable tablet Chew 2 tablets by mouth daily as needed for indigestion or  heartburn.    [provider]  cefdinir (OMNICEF) 300 MG capsule Take 1 capsule (300 mg total) by mouth 2 (two) times daily. 10/27/19   Copland, Gay Filler, MD  cephALEXin (KEFLEX) 500 MG capsule Take 1 capsule (500 mg total) by mouth 2 (two) times daily. 12/18/19   Malvin Johns, MD  cetirizine (ZYRTEC) 10 MG tablet TAKE 1 TABLET BY MOUTH DAILY AS NEEDED FOR ALLERGIES 12/16/17   Copland, Gay Filler, MD  Cholecalciferol (VITAMIN D3) 1000 UNITS CAPS Take 1,000  Units by mouth daily.     [provider]  dapagliflozin propanediol (FARXIGA) 10 MG TABS tablet Take 1 tablet (10 mg total) by mouth daily. 12/10/19   Shamleffer, Tremane Spurgeon Crazier, MD  FLUoxetine (PROZAC) 40 MG capsule Take 1 capsule (40 mg total) by mouth daily. 12/23/18   Copland, Gay Filler, MD  fluticasone (FLONASE) 50 MCG/ACT nasal spray SPRAY 2 SPRAYS INTO EACH NOSTRIL EVERY DAY 06/08/18   Collene Gobble, MD  furosemide (LASIX) 20 MG tablet TAKE 1 TABLET BY MOUTH EVERY DAY 07/28/19   Burtis Junes, NP  gabapentin (NEURONTIN) 300 MG capsule TAKE 2 CAPSULES BY MOUTH TWICE A DAY 03/16/19   Copland, Gay Filler, MD  hydrALAZINE (APRESOLINE) 25 MG tablet TAKE 2 TABLETS BY MOUTH 3 TIMES A DAY 11/15/19   Burtis Junes, NP  hydrOXYzine (ATARAX/VISTARIL) 25 MG tablet TAKE 1/2 TO 1 TABLET BY MOUTH EVERY 8 HOURS AS NEEDED FOR ITCHING 09/27/19   Copland, Gay Filler, MD  Insulin Glargine (BASAGLAR KWIKPEN) 100 UNIT/ML Inject 0.24 mLs (24 Units total) into the skin daily. 12/10/19   Shamleffer, Aleisa Howk Crazier, MD  Insulin Pen Needle 32G X 4 MM MISC 1 Device by Does not apply route as directed. 12/10/19   Shamleffer, Ronelle Smallman Crazier, MD  losartan (COZAAR) 100 MG tablet Take 1 tablet (100 mg total) by mouth daily. 09/10/17   Burtis Junes, NP  metFORMIN (GLUCOPHAGE-XR) 500 MG 24 hr tablet Take 2 tablets (1,000 mg total) by mouth 2 (two) times daily. 12/10/19   Shamleffer, Termaine Roupp Crazier, MD  methocarbamol (ROBAXIN) 750 MG tablet TAKE 1 TABLET (750  MG TOTAL) BY MOUTH EVERY 12 (TWELVE) HOURS AS NEEDED FOR MUSCLE SPASMS. 08/03/19   Copland, Gay Filler, MD  metoprolol succinate (TOPROL-XL) 100 MG 24 hr tablet TAKE 1 TABLET BY MOUTH EVERY DAY 12/10/19   Copland, Gay Filler, MD  montelukast (SINGULAIR) 10 MG tablet TAKE 1 TABLET BY MOUTH EVERY DAY AS NEEDED FOR ALLERGIES 10/20/19   Copland, Gay Filler, MD  nitroGLYCERIN (NITROSTAT) 0.4 MG SL tablet Place 1 tablet (0.4 mg total) under the tongue every 5 (five) minutes as needed for chest pain. 09/01/17   Copland, Gay Filler, MD  omega-3 acid ethyl esters (LOVAZA) 1 g capsule TAKE 1 CAPSULE (1 G TOTAL) BY MOUTH 2 (TWO) TIMES DAILY. Patient not taking: Reported on 10/19/2019 05/14/18   Burtis Junes, NP  Polyvinyl Alcohol-Povidone (REFRESH OP) Place 2 drops into both eyes daily as needed (for dry eyes).    [provider]  rosuvastatin (CRESTOR) 20 MG tablet Take 1 tablet (20 mg total) by mouth daily. 05/03/19   Copland, Gay Filler, MD  Tiotropium Bromide Monohydrate (SPIRIVA RESPIMAT) 2.5 MCG/ACT AERS Inhale 2 puffs into the lungs daily. 10/19/19   Colon Branch, MD  traZODone (DESYREL) 50 MG tablet TAKE 1/2 TO 1 TABLET BY MOUTH AT BEDTIME AS NEEDED FOR SLEEP 07/21/19   Copland, Gay Filler, MD  triamcinolone cream (KENALOG) 0.1 % Apply 1 application topically 2 (two) times daily. Use once or twice a day for poison ivy- apply sparingly to face.  Use no longer than 1 week 11/26/18   Copland, Gay Filler, MD    Allergies    Prednisone  Review of Systems   Review of Systems  Constitutional: Negative for chills, diaphoresis, fatigue and fever.  HENT: Negative for congestion, rhinorrhea and sneezing.   Eyes: Negative.   Respiratory: Negative for cough, chest tightness and shortness of breath.   Cardiovascular: Negative  for chest pain and leg swelling.  Gastrointestinal: Positive for nausea. Negative for abdominal pain, blood in stool, diarrhea and vomiting.  Genitourinary: Negative for difficulty urinating,  flank pain, frequency and hematuria.  Musculoskeletal: Negative for arthralgias and back pain.  Skin: Positive for rash.  Neurological: Negative for dizziness, speech difficulty, weakness, numbness and headaches.  Psychiatric/Behavioral: The patient is nervous/anxious.     Physical Exam Updated Vital Signs BP (!) 167/81 (BP Location: Right Arm)   Pulse 85   Temp 98.2 F (36.8 C) (Oral)   Resp 19   Ht 4\' 11"  (1.499 m)   Wt 82.1 kg   SpO2 95%   BMI 36.56 kg/m   Physical Exam Constitutional:      Appearance: She is well-developed.  HENT:     Head: Normocephalic and atraumatic.  Eyes:     Pupils: Pupils are equal, round, and reactive to light.  Cardiovascular:     Rate and Rhythm: Normal rate and regular rhythm.     Heart sounds: Normal heart sounds.  Pulmonary:     Effort: Pulmonary effort is normal. No respiratory distress.     Breath sounds: Normal breath sounds. No wheezing or rales.  Chest:     Chest wall: No tenderness.  Abdominal:     General: Bowel sounds are normal.     Palpations: Abdomen is soft.     Tenderness: There is abdominal tenderness (Mild tenderness in the suprapubic area). There is no guarding or rebound.  Musculoskeletal:        General: Normal range of motion.     Cervical back: Normal range of motion and neck supple.  Lymphadenopathy:     Cervical: No cervical adenopathy.  Skin:    General: Skin is warm and dry.     Findings: No rash.  Neurological:     General: No focal deficit present.     Mental Status: She is alert and oriented to person, place, and time.     ED Results / Procedures / Treatments   Labs (all labs ordered are listed, but only abnormal results are displayed) Labs Reviewed  COMPREHENSIVE METABOLIC PANEL - Abnormal; Notable for the following components:      Result Value   Potassium 3.3 (*)    Chloride 96 (*)    Glucose, Bld 111 (*)    Total Protein 8.2 (*)    All other components within normal limits  CBC - Abnormal;  Notable for the following components:   RBC 5.32 (*)    MCH 25.0 (*)    All other components within normal limits  URINALYSIS, ROUTINE W REFLEX MICROSCOPIC - Abnormal; Notable for the following components:   APPearance HAZY (*)    Glucose, UA >=500 (*)    Leukocytes,Ua TRACE (*)    All other components within normal limits  URINALYSIS, MICROSCOPIC (REFLEX) - Abnormal; Notable for the following components:   Bacteria, UA MANY (*)    All other components within normal limits  CBG MONITORING, ED - Abnormal; Notable for the following components:   Glucose-Capillary 106 (*)    All other components within normal limits  CBG MONITORING, ED - Abnormal; Notable for the following components:   Glucose-Capillary 103 (*)    All other components within normal limits  URINE CULTURE  LIPASE, BLOOD    EKG None  Radiology No results found.  Procedures Procedures (including critical care time)  Medications Ordered in ED Medications  potassium chloride SA (KLOR-CON) CR tablet 40 mEq (has  no administration in time range)  acetaminophen (TYLENOL) tablet 1,000 mg (1,000 mg Oral Given 12/18/19 1929)  dexamethasone (DECADRON) injection 10 mg (10 mg Intramuscular Given 12/18/19 1935)  cephALEXin (KEFLEX) capsule 500 mg (500 mg Oral Given 12/18/19 1934)    ED Course  I have reviewed the triage vital signs and the nursing notes.  Pertinent labs & imaging results that were available during my care of the patient were reviewed by me and considered in my medical decision making (see chart for details).    MDM Rules/Calculators/A&P                      Patient is a 69 year old female who presents with poison oak.  She requests a shot.  She doesn't want steroid pills because she says they make her crazy.  She feels a little jittery and she thinks it may be because of her new insulin.  She otherwise doesn't have any complaints.  She has had no vomiting.  No abdominal tenderness other than some mild  suprapubic tenderness and she does have what looks to be an associated UTI.  She has a mildly low potassium and was given dose of potassium in the ED.  I will start her on Keflex and she was given a shot of Decadron for poison oak.  She was advised to keep a close eye on her blood sugars after the steroids.  She was discharged home in good condition.  Return precautions were given.  Final Clinical Impression(s) / ED Diagnoses Final diagnoses:  Poison oak  Urinary tract infection without hematuria, site unspecified    Rx / DC Orders ED Discharge Orders         Ordered    cephALEXin (KEFLEX) 500 MG capsule  2 times daily     12/18/19 1944           Malvin Johns, MD 12/18/19 1950

## 2019-12-18 NOTE — ED Triage Notes (Signed)
Pt c/o nausea, headache, shaky and also c/o poison ivy to several areas.

## 2019-12-20 LAB — URINE CULTURE: Culture: <10000 — AB

## 2019-12-22 ENCOUNTER — Other Ambulatory Visit: Payer: Self-pay | Admitting: Family Medicine

## 2019-12-22 DIAGNOSIS — L299 Pruritus, unspecified: Secondary | ICD-10-CM

## 2019-12-29 NOTE — Progress Notes (Signed)
Pt did not answer for visit. Attempt x4. No show. LVM for pt to call to reschedule.

## 2019-12-31 ENCOUNTER — Other Ambulatory Visit: Payer: Self-pay

## 2019-12-31 ENCOUNTER — Ambulatory Visit: Payer: Medicare Other | Admitting: *Deleted

## 2020-01-06 ENCOUNTER — Encounter: Payer: Self-pay | Admitting: *Deleted

## 2020-01-06 ENCOUNTER — Other Ambulatory Visit: Payer: Self-pay | Admitting: Family Medicine

## 2020-01-06 ENCOUNTER — Other Ambulatory Visit: Payer: Self-pay | Admitting: *Deleted

## 2020-01-06 DIAGNOSIS — R059 Cough, unspecified: Secondary | ICD-10-CM

## 2020-01-06 NOTE — Patient Outreach (Signed)
[facility]) Care Management  [place]  01/06/2020   Destiny Ballard 03-07-1951 [id number]   [place] Every other Month Outreach   Referral Date:  12/25/2017 Referral Source:  Transfer from [facility] Reason for Referral:  Continued Disease Management Education Insurance:  Medicare   Outreach Attempt:  Successful telephone outreach to patient for follow up.  HIPAA verified with patient.  Patient reporting recent poison oak outbreak and urinary tract infection.  States the poison oak was from one of the squirrels she rescued and it is completely healed and states she competed her course of antibiotics prescribed.  Denies any recent shortness of breath and has not had to use her rescue inhaler within the last few weeks.  Admits to not using her Spiriva, maintenance inhaler daily as ordered.  Discussed importance of taking medication as prescribed and the difference between maintenance versus rescue inhalers.  Has been monitoring blood sugars more frequently.  Fasting blood sugar this morning was 160 with recent fasting ranges 150-200.  Hgb A1C decreased to 7.8.  Discussed medication changes and monitoring for hypoglycemia.  Patient does report falling at daughters house and has been having sacral pain at times after fall.  Encouraged patient to discuss with her providers.  Encounter Medications:  Outpatient Encounter Medications as of 01/06/2020  Medication Sig Note  . albuterol (VENTOLIN HFA) 108 (90 Base) MCG/ACT inhaler INHALE 2 PUFFS INTO THE LUNGS EVERY 8 (EIGHT) HOURS AS NEEDED FOR WHEEZING OR SHORTNESS OF BREATH   . aspirin EC 81 MG tablet Take 81 mg by mouth daily.   . benzonatate (TESSALON) 200 MG capsule TAKE 1 CAPSULE BY MOUTH THREE TIMES A DAY AS NEEDED FOR COUGH   . calcium carbonate (TUMS - DOSED IN MG ELEMENTAL CALCIUM) 500 MG chewable tablet Chew 2 tablets by mouth daily as needed for indigestion or heartburn.   . cetirizine (ZYRTEC)  10 MG tablet TAKE 1 TABLET BY MOUTH DAILY AS NEEDED FOR ALLERGIES   . Cholecalciferol (VITAMIN D3) 1000 UNITS CAPS Take 1,000 Units by mouth daily.    . dapagliflozin propanediol (FARXIGA) 10 MG TABS tablet Take 1 tablet (10 mg total) by mouth daily.   [name] FLUoxetine (PROZAC) 40 MG capsule Take 1 capsule (40 mg total) by mouth daily.   . fluticasone (FLONASE) 50 MCG/ACT nasal spray SPRAY 2 SPRAYS INTO EACH NOSTRIL EVERY DAY   . furosemide (LASIX) 20 MG tablet TAKE 1 TABLET BY MOUTH EVERY DAY   . gabapentin (NEURONTIN) 300 MG capsule TAKE 2 CAPSULES BY MOUTH TWICE A DAY   . hydrALAZINE (APRESOLINE) 25 MG tablet TAKE 2 TABLETS BY MOUTH 3 TIMES A DAY   . Insulin Glargine (BASAGLAR KWIKPEN) 100 UNIT/ML Inject 0.24 mLs (24 Units total) into the skin daily.   [name] losartan (COZAAR) 100 MG tablet Take 1 tablet (100 mg total) by mouth daily.   . metFORMIN (GLUCOPHAGE-XR) 500 MG 24 hr tablet Take 2 tablets (1,000 mg total) by mouth 2 (two) times daily.   . methocarbamol (ROBAXIN) 750 MG tablet TAKE 1 TABLET (750 MG TOTAL) BY MOUTH EVERY 12 (TWELVE) HOURS AS NEEDED FOR MUSCLE SPASMS.   . metoprolol succinate (TOPROL-XL) 100 MG 24 hr tablet TAKE 1 TABLET BY MOUTH EVERY DAY   . montelukast (SINGULAIR) 10 MG tablet TAKE 1 TABLET BY MOUTH EVERY DAY AS NEEDED FOR ALLERGIES   . omega-3 acid ethyl esters (LOVAZA) 1 g capsule TAKE 1 CAPSULE (1 G TOTAL) BY MOUTH 2 (  TWO) TIMES DAILY.   . rosuvastatin (CRESTOR) 20 MG tablet Take 1 tablet (20 mg total) by mouth daily.   . Tiotropium Bromide Monohydrate (SPIRIVA RESPIMAT) 2.5 MCG/ACT AERS Inhale 2 puffs into the lungs daily.   . traZODone (DESYREL) 50 MG tablet TAKE 1/2 TO 1 TABLET BY MOUTH AT BEDTIME AS NEEDED FOR SLEEP   . acetic acid-hydrocortisone (VOSOL-HC) OTIC solution Place 3 drops into both ears 3 (three) times daily. Use as needed for ear discomfort   . azithromycin (ZITHROMAX) 250 MG tablet Take 2 tablets by mouth first day, then 1 tablet for 4 additional days    . cefdinir (OMNICEF) 300 MG capsule Take 1 capsule (300 mg total) by mouth 2 (two) times daily.   . cephALEXin (KEFLEX) 500 MG capsule Take 1 capsule (500 mg total) by mouth 2 (two) times daily.   . hydrOXYzine (ATARAX/VISTARIL) 25 MG tablet TAKE 1/2 TO 1 TABLET BY MOUTH EVERY 8 HOURS AS NEEDED FOR ITCHING   . Insulin Pen Needle 32G X 4 MM MISC 1 Device by Does not apply route as directed.   . nitroGLYCERIN (NITROSTAT) 0.4 MG SL tablet Place 1 tablet (0.4 mg total) under the tongue every 5 (five) minutes as needed for chest pain. 10/19/2019: PRN  . Polyvinyl Alcohol-Povidone (REFRESH OP) Place 2 drops into both eyes daily as needed (for dry eyes).   . triamcinolone cream (KENALOG) 0.1 % Apply 1 application topically 2 (two) times daily. Use once or twice a day for poison ivy- apply sparingly to face.  Use no longer than 1 week    No facility-administered encounter medications on file as of 01/06/2020.    Functional Status:  In your present state of health, do you have any difficulty performing the following activities: 01/06/2020  Hearing? Y  Comment trouble hearing out of left ear  Vision? N  Difficulty concentrating or making decisions? N  Walking or climbing stairs? Y  Comment difficulties climbing stairs  Dressing or bathing? N  Doing errands, shopping? N  Preparing Food and eating ? N  Using the Toilet? N  In the past six months, have you accidently leaked urine? Y  Comment some urinary incontinence  Do you have problems with loss of bowel control? Y  Comment wears depends  Managing your Medications? N  Managing your Finances? N  Housekeeping or managing your Housekeeping? N  Some recent data might be hidden    Fall/Depression Screening: Fall Risk  01/06/2020 08/06/2019 05/07/2019  Falls in the past year? 1 1 1   Comment Fall in April 2021 last fall 04/2019 Fall in 10/20  Number falls in past yr: 1 1 0  Injury with Fall? 1 1 1   Comment - - Left rib fracture  Risk for fall due  to : History of fall(s);Medication side effect;Impaired balance/gait;Impaired mobility;Impaired vision;Orthopedic patient History of fall(s);Medication side effect;Impaired balance/gait;Impaired mobility History of fall(s);Impaired balance/gait;Impaired mobility;Impaired vision;Medication side effect  Follow up Falls evaluation completed;Education provided;Falls prevention discussed Falls prevention discussed;Education provided;Falls evaluation completed Falls evaluation completed;Education provided;Falls prevention discussed   PHQ 2/9 Scores 01/06/2020 12/29/2018 02/03/2018 10/24/2017 09/01/2017 08/27/2016 09/20/2015  PHQ - 2 Score 6 2 2 6 6 3  0  PHQ- 9 Score 13 7 6 9 21 15  -  Exception Documentation - - - - - - Patient refusal   [facility] CM Care Plan Problem One     Most Recent Value  Care Plan Problem One Knowledge deficiet of COPD diagnosis and self care management of diabetes  Role Documenting the Problem One [place] for Problem One Active  [facility] Long Term Goal  Patient will report maintaining Hgb A1C of below 8 within the next 90 days.  [facility] Long Term Goal Start Date 01/06/20  [facility] Long Term Goal Met Date 01/06/20  Interventions for Problem One Long Term Goal Congratulated patient on reduction of her hgb A1C, care plan and goals reviewed and discussed, reviewed medication, medication changes and encouraged medication compliance, confirmed patient can afford new medications,  discussed depression and counseling and offered [facility] [employer] referral (patient declines), encouraged to keep and attend scheduled medical appointments, discussed healthier diabetic drink and meal options, encouraged to eat at least 3 well balanced meals a day, encouraged to continue to monitor blood sugars and to notify provider for sustained elevations or lows, reviewed signs and symptoms of hypoglycemia  [facility] CM Short Term Goal #1  Patient will report using maintainence inhaler daily as prescribed within the next 60  days  [facility] CM Short Term Goal #1 Start Date 01/06/20  Interventions for Short Term Goal #1 Discussed importance of maintainence inhaler, reviewed the difference between rescue and maintainence inhaler, encouraged patient to use Spiriva as prescribed daily  [facility]N CM Short Term Goal #2        Appointments:  Attended appointment with primary care provider, Dr. Lorelei Pont on 11/03/2019.  Attended appointment with Dr. Kelton Pillar, Endocrinology on 12/10/2019 and has scheduled follow up on 03/14/2020.  Plan: RN Health Coach will send patient quarterly update. RN Health Coach will make next telephone outreach to patient within the month of August and patient verbally agrees to future outreach.  [place] [phone] [email or web address]

## 2020-01-15 ENCOUNTER — Other Ambulatory Visit: Payer: Self-pay | Admitting: Family Medicine

## 2020-01-15 DIAGNOSIS — F5102 Adjustment insomnia: Secondary | ICD-10-CM

## 2020-01-25 ENCOUNTER — Other Ambulatory Visit: Payer: Self-pay | Admitting: Family Medicine

## 2020-02-06 NOTE — Progress Notes (Deleted)
[facility] [address], [place], [place] [address] [phone]  Date:  02/07/2020   Name:  Destiny Ballard   DOB:  05-17-1951   MRN:  [id number]  PCP:  Darreld Mclean, MD    Chief Complaint: No chief complaint on file.   History of Present Illness:  Destiny Ballard is a 69 y.o. very pleasant female patient who presents with the following:  Pt with history of CAD, HTN, CHF, OSA, COPD, DM, GERD, HLD Last seen by myself in April of this year  She is seeing Dr Kelton Pillar now for her DM - at her last visit in May: MEDICATIONS: - STOP Glipizide - STOP Levemir - Metformin 500 mg , 2 tablet twice daily  - Restart Farxiga 10 mg , 1 tablet daily in the morning  - Start Basaglar 24 units daily    covid 19 vaccine Eye exam: Patient Active Problem List   Diagnosis Date Noted  . Type 2 diabetes mellitus with hyperglycemia, with long-term current use of insulin ([place]) 12/10/2019  . Type 2 diabetes mellitus with diabetic polyneuropathy, with long-term current use of insulin ([place]) 12/10/2019  . Obesity (BMI 30-39.9) 08/24/2018  . Depression with anxiety 08/24/2018  . CAD (coronary artery disease) 08/24/2018  . Abnormal cardiovascular stress test 08/24/2018  . Lobar pneumonia, unspecified organism ([place]) 11/13/2017  . Precordial chest pain   . Sepsis ([place]) 09/18/2017  . Chronic diastolic CHF (congestive heart failure) ([place]) 09/18/2017  . Diabetes mellitus ([place]) 09/08/2017  . Left hip pain 10/28/2016  . Chronic rhinitis 08/07/2016  . COPD with acute exacerbation ([place]) 04/25/2016  . Essential hypertension 01/30/2016  . Hypokalemia 01/27/2016  . HLD (hyperlipidemia)   . Type 2 diabetes mellitus without complication, without long-term current use of insulin ([place])   . OSA (obstructive sleep apnea) 05/06/2014  . Dyspnea on exertion 09/02/2012  . Chest pain 12/10/2011  . Chronic cough 05/11/2010  . GERD 06/28/2008     Past Medical History:  Diagnosis Date  . Anxiety    Prior suicide attempt  . CAD (coronary artery disease)    a) s/p DES to LAD 07/2005 b) Last Myoview low risk 11/2011 showing small fixed apical perfusion defect (prior MI vs attenuation) but no ischemia - normal EF.  [name] Cervical spondylosis   . Coronary atherosclerosis 06/28/2008  . Depression   . Depression with anxiety   . Diabetes mellitus without complication ([place])   . GERD (gastroesophageal reflux disease)   . Hyperlipidemia   . Hypertension   . Insulin resistance   . Iron deficiency anemia   . Obesity     Past Surgical History:  Procedure Laterality Date  . BREAST ENHANCEMENT SURGERY    . CARDIAC CATHETERIZATION  06/17/2007   NORMAL. EF 60%  . CARDIAC CATHETERIZATION N/A 01/29/2016   Procedure: Left Heart Cath and Coronary Angiography;  Surgeon: Sherren Mocha, MD;  Location: [place] [facility];  Service: Cardiovascular;  Laterality: N/A;  . CERVICAL SPONDYLOSIS     SINGLE LEVEL FUSION  . CHILDBIRTH     X3  . CORONARY STENT PLACEMENT  07/2005   LEFT ANTERIOR DESCENDING  . FOREARM FRACTURE SURGERY  2010   hand and shoulder   . INCISION AND DRAINAGE BREAST ABSCESS  01/05/2012      . INCISION AND DRAINAGE PERIRECTAL ABSCESS N/A 02/18/2014   Procedure: IRRIGATION AND DEBRIDEMENT PERIRECTAL ABSCESS;  Surgeon: Pedro Earls, MD;  [facility];  Service: General;  Laterality: N/A;  . LEFT HEART CATH AND CORONARY ANGIOGRAPHY N/A 10/10/2017   Procedure: LEFT HEART CATH AND CORONARY ANGIOGRAPHY;  Surgeon: Burnell Blanks, MD;  Location: [place] [facility];  Service: Cardiovascular;  Laterality: N/A;  . LUMBAR LAMINECTOMY    . ROTATOR CUFF REPAIR     bilaterla  . TONSILLECTOMY AND ADENOIDECTOMY    . TUBAL LIGATION    . VIDEO BRONCHOSCOPY Bilateral 08/13/2016   Procedure: VIDEO BRONCHOSCOPY WITHOUT FLUORO;  Surgeon: Collene Gobble, MD;  [facility];  Service: Cardiopulmonary;  Laterality: Bilateral;     Social History   Tobacco Use  . Smoking status: Passive Smoke Exposure - Never Smoker  . Smokeless tobacco: Never Used  Vaping Use  . Vaping Use: Never used  Substance Use Topics  . Alcohol use: Yes    Comment: occ  . Drug use: No    Family History  Problem Relation Age of Onset  . Heart attack Mother   . Diabetes Mother   . Lung cancer Mother   . Asthma Mother   . Heart disease Mother   . Suicidality Father        "killed himself"  . Asthma Daughter        x 2  . Cancer Daughter        pre-cancerous polyp  . Diabetes Sister   . Cancer Sister   . Cervical cancer Daughter        cervical   . Allergies Other        all family--seasonal allergies    Allergies  Allergen Reactions  . Prednisone Other (See Comments)    REACTION: mood swings, nightmares. "Shot doesn't bother me, reaction is just with the pill" she states she has had the steroid injections before. From our records methylprednisone was given to her in 2013 without any complications.    Medication list has been reviewed and updated.  Current Outpatient Medications on File Prior to Visit  Medication Sig Dispense Refill  . acetic acid-hydrocortisone (VOSOL-HC) OTIC solution Place 3 drops into both ears 3 (three) times daily. Use as needed for ear discomfort 10 mL 0  . albuterol (VENTOLIN HFA) 108 (90 Base) MCG/ACT inhaler INHALE 2 PUFFS INTO THE LUNGS EVERY 8 (EIGHT) HOURS AS NEEDED FOR WHEEZING OR SHORTNESS OF BREATH 18 g 2  . aspirin EC 81 MG tablet Take 81 mg by mouth daily.    [name] azithromycin (ZITHROMAX) 250 MG tablet Take 2 tablets by mouth first day, then 1 tablet for 4 additional days 6 tablet 0  . benzonatate (TESSALON) 200 MG capsule TAKE 1 CAPSULE BY MOUTH THREE TIMES A DAY AS NEEDED FOR COUGH 90 capsule 5  . calcium carbonate (TUMS - DOSED IN MG ELEMENTAL CALCIUM) 500 MG chewable tablet Chew 2 tablets by mouth daily as needed for indigestion or heartburn.    . cefdinir (OMNICEF) 300 MG capsule  Take 1 capsule (300 mg total) by mouth 2 (two) times daily. 20 capsule 0  . cephALEXin (KEFLEX) 500 MG capsule Take 1 capsule (500 mg total) by mouth 2 (two) times daily. 14 capsule 0  . cetirizine (ZYRTEC) 10 MG tablet TAKE 1 TABLET BY MOUTH DAILY AS NEEDED FOR ALLERGIES 90 tablet 1  . Cholecalciferol (VITAMIN D3) 1000 UNITS CAPS Take 1,000 Units by mouth daily.     . dapagliflozin propanediol (FARXIGA) 10 MG TABS tablet Take 1 tablet (10 mg total) by mouth daily. 90 tablet  3  . FLUoxetine (PROZAC) 40 MG capsule Take 1 capsule (40 mg total) by mouth daily. 90 capsule 3  . fluticasone (FLONASE) 50 MCG/ACT nasal spray SPRAY 2 SPRAYS INTO EACH NOSTRIL EVERY DAY 48 g 0  . furosemide (LASIX) 20 MG tablet TAKE 1 TABLET BY MOUTH EVERY DAY 90 tablet 2  . gabapentin (NEURONTIN) 300 MG capsule TAKE 2 CAPSULES BY MOUTH TWICE A DAY 360 capsule 1  . hydrALAZINE (APRESOLINE) 25 MG tablet TAKE 2 TABLETS BY MOUTH 3 TIMES A DAY 540 tablet 0  . hydrOXYzine (ATARAX/VISTARIL) 25 MG tablet TAKE 1/2 TO 1 TABLET BY MOUTH EVERY 8 HOURS AS NEEDED FOR ITCHING 270 tablet 3  . Insulin Glargine (BASAGLAR KWIKPEN) 100 UNIT/ML Inject 0.24 mLs (24 Units total) into the skin daily. 15 mL 11  . Insulin Pen Needle 32G X 4 MM MISC 1 Device by Does not apply route as directed. 100 each 11  . losartan (COZAAR) 100 MG tablet Take 1 tablet (100 mg total) by mouth daily. 90 tablet 3  . metFORMIN (GLUCOPHAGE-XR) 500 MG 24 hr tablet Take 2 tablets (1,000 mg total) by mouth 2 (two) times daily. 360 tablet 3  . methocarbamol (ROBAXIN) 750 MG tablet TAKE 1 TABLET (750 MG TOTAL) BY MOUTH EVERY 12 (TWELVE) HOURS AS NEEDED FOR MUSCLE SPASMS. 60 tablet 3  . metoprolol succinate (TOPROL-XL) 100 MG 24 hr tablet TAKE 1 TABLET BY MOUTH EVERY DAY 90 tablet 3  . montelukast (SINGULAIR) 10 MG tablet TAKE 1 TABLET BY MOUTH EVERY DAY AS NEEDED FOR ALLERGIES 90 tablet 1  . nitroGLYCERIN (NITROSTAT) 0.4 MG SL tablet Place 1 tablet (0.4 mg total) under the  tongue every 5 (five) minutes as needed for chest pain. 20 tablet 3  . omega-3 acid ethyl esters (LOVAZA) 1 g capsule TAKE 1 CAPSULE (1 G TOTAL) BY MOUTH 2 (TWO) TIMES DAILY. 180 capsule 1  . Polyvinyl Alcohol-Povidone (REFRESH OP) Place 2 drops into both eyes daily as needed (for dry eyes).    . rosuvastatin (CRESTOR) 20 MG tablet Take 1 tablet (20 mg total) by mouth daily. 90 tablet 3  . Tiotropium Bromide Monohydrate (SPIRIVA RESPIMAT) 2.5 MCG/ACT AERS Inhale 2 puffs into the lungs daily. 4 g 3  . traZODone (DESYREL) 50 MG tablet TAKE 1/2 TO 1 TABLET BY MOUTH AT BEDTIME AS NEEDED FOR SLEEP 90 tablet 1  . triamcinolone cream (KENALOG) 0.1 % Apply 1 application topically 2 (two) times daily. Use once or twice a day for poison ivy- apply sparingly to face.  Use no longer than 1 week 30 g 0   No current facility-administered medications on file prior to visit.    Review of Systems:  As per HPI- otherwise negative.   Physical Examination: There were no vitals filed for this visit. There were no vitals filed for this visit. There is no height or weight on file to calculate BMI. Ideal Body Weight:    GEN: no acute distress. HEENT: Atraumatic, Normocephalic.  Ears and Nose: No external deformity. CV: RRR, No M/G/R. No JVD. No thrill. No extra heart sounds. PULM: CTA B, no wheezes, crackles, rhonchi. No retractions. No resp. distress. No accessory muscle use. ABD: S, NT, ND, +BS. No rebound. No HSM. EXTR: No c/c/e PSYCH: Normally interactive. Conversant.    Assessment and Plan: *** This visit occurred during the SARS-CoV-2 public health emergency.  Safety protocols were in place, including screening questions prior to the visit, additional usage of staff PPE, and extensive  cleaning of exam room while observing appropriate contact time as indicated for disinfecting solutions.    Signed Lamar Blinks, MD

## 2020-02-07 ENCOUNTER — Ambulatory Visit: Payer: Medicare Other | Admitting: Family Medicine

## 2020-02-07 ENCOUNTER — Other Ambulatory Visit: Payer: Self-pay | Admitting: Nurse Practitioner

## 2020-02-08 ENCOUNTER — Other Ambulatory Visit: Payer: Self-pay | Admitting: Family Medicine

## 2020-02-08 DIAGNOSIS — E1165 Type 2 diabetes mellitus with hyperglycemia: Secondary | ICD-10-CM

## 2020-02-08 DIAGNOSIS — IMO0002 Reserved for concepts with insufficient information to code with codable children: Secondary | ICD-10-CM

## 2020-02-26 ENCOUNTER — Other Ambulatory Visit: Payer: Self-pay | Admitting: Family Medicine

## 2020-02-26 DIAGNOSIS — IMO0002 Reserved for concepts with insufficient information to code with codable children: Secondary | ICD-10-CM

## 2020-02-26 DIAGNOSIS — E1165 Type 2 diabetes mellitus with hyperglycemia: Secondary | ICD-10-CM

## 2020-02-28 ENCOUNTER — Other Ambulatory Visit: Payer: Self-pay | Admitting: Family Medicine

## 2020-02-28 DIAGNOSIS — F32A Depression, unspecified: Secondary | ICD-10-CM

## 2020-02-28 DIAGNOSIS — F329 Major depressive disorder, single episode, unspecified: Secondary | ICD-10-CM

## 2020-03-03 DIAGNOSIS — E119 Type 2 diabetes mellitus without complications: Secondary | ICD-10-CM | POA: Diagnosis not present

## 2020-03-03 DIAGNOSIS — H52203 Unspecified astigmatism, bilateral: Secondary | ICD-10-CM | POA: Diagnosis not present

## 2020-03-14 ENCOUNTER — Encounter: Payer: Self-pay | Admitting: Internal Medicine

## 2020-03-14 ENCOUNTER — Other Ambulatory Visit: Payer: Self-pay | Admitting: *Deleted

## 2020-03-14 ENCOUNTER — Other Ambulatory Visit: Payer: Self-pay

## 2020-03-14 ENCOUNTER — Ambulatory Visit (INDEPENDENT_AMBULATORY_CARE_PROVIDER_SITE_OTHER): Payer: Medicare Other | Admitting: Internal Medicine

## 2020-03-14 VITALS — BP 124/69 | HR 79 | Ht 59.0 in | Wt 185.0 lb

## 2020-03-14 DIAGNOSIS — IMO0002 Reserved for concepts with insufficient information to code with codable children: Secondary | ICD-10-CM

## 2020-03-14 DIAGNOSIS — Z794 Long term (current) use of insulin: Secondary | ICD-10-CM | POA: Diagnosis not present

## 2020-03-14 DIAGNOSIS — E118 Type 2 diabetes mellitus with unspecified complications: Secondary | ICD-10-CM

## 2020-03-14 DIAGNOSIS — E1142 Type 2 diabetes mellitus with diabetic polyneuropathy: Secondary | ICD-10-CM

## 2020-03-14 DIAGNOSIS — E1159 Type 2 diabetes mellitus with other circulatory complications: Secondary | ICD-10-CM

## 2020-03-14 DIAGNOSIS — E1165 Type 2 diabetes mellitus with hyperglycemia: Secondary | ICD-10-CM | POA: Insufficient documentation

## 2020-03-14 HISTORY — DX: Type 2 diabetes mellitus with hyperglycemia: E11.65

## 2020-03-14 HISTORY — DX: Reserved for concepts with insufficient information to code with codable children: IMO0002

## 2020-03-14 LAB — POCT GLYCOSYLATED HEMOGLOBIN (HGB A1C): Hemoglobin A1C: 8.2 % — AB (ref 4.0–5.6)

## 2020-03-14 MED ORDER — TRULICITY 0.75 MG/0.5ML ~~LOC~~ SOAJ
0.7500 mg | SUBCUTANEOUS | 6 refills | Status: DC
Start: 2020-03-14 — End: 2020-08-23

## 2020-03-14 NOTE — Progress Notes (Signed)
Name: Destiny Ballard  Age/ Sex: 69 y.o., female   MRN/ DOB: [id number], 04-17-1951     PCP: Darreld Mclean, MD   Reason for Endocrinology Evaluation: Type 2 Diabetes Mellitus  Initial Endocrine Consultative Visit: 12/10/2019    PATIENT IDENTIFIER: Destiny Ballard is a 69 y.o. female with a past medical history of T2Dm, dyslipidemia, CAD , OSA and HTN . The patient has followed with Endocrinology clinic since 12/10/2019 for consultative assistance with management of her diabetes.  DIABETIC HISTORY:  Destiny Ballard was diagnosed with T2DM many years ago.On her initial visit she was on oral glycemic agents and insulin. Does not recall prior meds.  Her hemoglobin A1c has ranged from 6.2% in 2013, peaking at 10.1% in 2020    On her initial visit to our clinic she had an A1c of 7.8%, she was on metformin, Farxiga, Glipizide and Levemir. We stopped Glipizide, Levemir and started basaglar, continued Metformin and farxiga    SUBJECTIVE:   During the last visit (12/10/2019): A1c 7.8%. Stopped Glipizide, and levemir. Started Development worker, community and continued Metformin and [place].   Today (03/14/2020): Destiny Ballard is here for a 3 month follow up on diabetes management.  She checks her blood sugars 1 times daily.The patient has not had hypoglycemic episodes since the last clinic visit.    HOME DIABETES REGIMEN:  Metformin 500 mg , 2 tablet twice daily  Farxiga 10 mg , 1 tablet daily in the morning  Basaglar 24 units daily    Statin: Yes ACE-I/ARB: Yes   METER DOWNLOAD SUMMARY:  [id number]  DIABETIC COMPLICATIONS: Microvascular complications:   Neuropathy   Denies: CKD, retinopathy   Last eye exam: has an eye exam for 02/2020  Macrovascular complications:   CAD (S/P stent placement)   Denies: PVD, CVA   HISTORY:  Past Medical History:  Past Medical History:  Diagnosis Date  . Anxiety    Prior suicide attempt  . CAD (coronary artery disease)    a) s/p DES to LAD 07/2005 b)  Last Myoview low risk 11/2011 showing small fixed apical perfusion defect (prior MI vs attenuation) but no ischemia - normal EF.  [name] Cervical spondylosis   . Coronary atherosclerosis 06/28/2008  . Depression   . Depression with anxiety   . Diabetes mellitus without complication ([place])   . GERD (gastroesophageal reflux disease)   . Hyperlipidemia   . Hypertension   . Insulin resistance   . Iron deficiency anemia   . Obesity    Past Surgical History:  Past Surgical History:  Procedure Laterality Date  . BREAST ENHANCEMENT SURGERY    . CARDIAC CATHETERIZATION  06/17/2007   NORMAL. EF 60%  . CARDIAC CATHETERIZATION N/A 01/29/2016   Procedure: Left Heart Cath and Coronary Angiography;  Surgeon: Sherren Mocha, MD;  Location: [place] [facility];  Service: Cardiovascular;  Laterality: N/A;  . CERVICAL SPONDYLOSIS     SINGLE LEVEL FUSION  . CHILDBIRTH     X3  . CORONARY STENT PLACEMENT  07/2005   LEFT ANTERIOR DESCENDING  . FOREARM FRACTURE SURGERY  2010   hand and shoulder   . INCISION AND DRAINAGE BREAST ABSCESS  01/05/2012      . INCISION AND DRAINAGE PERIRECTAL ABSCESS N/A 02/18/2014   Procedure: IRRIGATION AND DEBRIDEMENT PERIRECTAL ABSCESS;  Surgeon: Pedro Earls, MD;  [facility];  Service: General;  Laterality: N/A;  . LEFT HEART CATH AND CORONARY ANGIOGRAPHY N/A 10/10/2017   Procedure: LEFT HEART CATH AND CORONARY ANGIOGRAPHY;  Surgeon: Burnell Blanks, MD;  Location: [place] [facility];  Service: Cardiovascular;  Laterality: N/A;  . LUMBAR LAMINECTOMY    . ROTATOR CUFF REPAIR     bilaterla  . TONSILLECTOMY AND ADENOIDECTOMY    . TUBAL LIGATION    . VIDEO BRONCHOSCOPY Bilateral 08/13/2016   Procedure: VIDEO BRONCHOSCOPY WITHOUT FLUORO;  Surgeon: Collene Gobble, MD;  [facility];  Service: Cardiopulmonary;  Laterality: Bilateral;    Social History:  reports that she is a non-smoker but has been exposed to tobacco smoke. She has never used smokeless  tobacco. She reports current alcohol use. She reports that she does not use drugs. Family History:  Family History  Problem Relation Age of Onset  . Heart attack Mother   . Diabetes Mother   . Lung cancer Mother   . Asthma Mother   . Heart disease Mother   . Suicidality Father        "killed himself"  . Asthma Daughter        x 2  . Cancer Daughter        pre-cancerous polyp  . Diabetes Sister   . Cancer Sister   . Cervical cancer Daughter        cervical   . Allergies Other        all family--seasonal allergies     HOME MEDICATIONS: Allergies as of 03/14/2020      Reactions   Prednisone Other (See Comments)   REACTION: mood swings, nightmares. "Shot doesn't bother me, reaction is just with the pill" she states she has had the steroid injections before. From our records methylprednisone was given to her in 2013 without any complications.      Medication List       Accurate as of March 14, 2020  4:18 PM. If you have any questions, ask your nurse or doctor.        STOP taking these medications   acetic acid-hydrocortisone OTIC solution Commonly known as: VOSOL-HC Stopped by: Dorita Sciara, MD   azithromycin 250 MG tablet Commonly known as: ZITHROMAX Stopped by: Dorita Sciara, MD   benzonatate 200 MG capsule Commonly known as: TESSALON Stopped by: Dorita Sciara, MD   cefdinir 300 MG capsule Commonly known as: OMNICEF Stopped by: Dorita Sciara, MD   cephALEXin 500 MG capsule Commonly known as: KEFLEX Stopped by: Dorita Sciara, MD   omega-3 acid ethyl esters 1 g capsule Commonly known as: LOVAZA Stopped by: Dorita Sciara, MD     TAKE these medications   albuterol 108 (90 Base) MCG/ACT inhaler Commonly known as: VENTOLIN HFA INHALE 2 PUFFS INTO THE LUNGS EVERY 8 (EIGHT) HOURS AS NEEDED FOR WHEEZING OR SHORTNESS OF BREATH   aspirin EC 81 MG tablet Take 81 mg by mouth daily.   Basaglar KwikPen 100 UNIT/ML  Inject 0.24 mLs (24 Units total) into the skin daily.   calcium carbonate 500 MG chewable tablet Commonly known as: TUMS - dosed in mg elemental calcium Chew 2 tablets by mouth daily as needed for indigestion or heartburn.   cetirizine 10 MG tablet Commonly known as: ZYRTEC TAKE 1 TABLET BY MOUTH DAILY AS NEEDED FOR ALLERGIES   dapagliflozin propanediol 10 MG Tabs tablet Commonly known as: FARXIGA Take 1 tablet (10 mg total) by mouth daily.   FLUoxetine 40 MG capsule Commonly known as: PROZAC TAKE 1 CAPSULE BY MOUTH EVERY DAY   fluticasone 50 MCG/ACT nasal spray Commonly known as: FLONASE SPRAY  2 SPRAYS INTO EACH NOSTRIL EVERY DAY   furosemide 20 MG tablet Commonly known as: LASIX TAKE 1 TABLET BY MOUTH EVERY DAY   gabapentin 300 MG capsule Commonly known as: NEURONTIN TAKE 2 CAPSULES BY MOUTH TWICE A DAY   hydrALAZINE 25 MG tablet Commonly known as: APRESOLINE Patient needs to schedule appointment for any future refills.  Please call [phone].  1st attempt.   hydrOXYzine 25 MG tablet Commonly known as: ATARAX/VISTARIL TAKE 1/2 TO 1 TABLET BY MOUTH EVERY 8 HOURS AS NEEDED FOR ITCHING   Insulin Pen Needle 32G X 4 MM Misc 1 Device by Does not apply route as directed.   losartan 100 MG tablet Commonly known as: COZAAR Take 1 tablet (100 mg total) by mouth daily.   metFORMIN 500 MG 24 hr tablet Commonly known as: GLUCOPHAGE-XR Take 2 tablets (1,000 mg total) by mouth 2 (two) times daily.   methocarbamol 750 MG tablet Commonly known as: ROBAXIN TAKE 1 TABLET (750 MG TOTAL) BY MOUTH EVERY 12 (TWELVE) HOURS AS NEEDED FOR MUSCLE SPASMS.   metoprolol succinate 100 MG 24 hr tablet Commonly known as: TOPROL-XL TAKE 1 TABLET BY MOUTH EVERY DAY   montelukast 10 MG tablet Commonly known as: SINGULAIR TAKE 1 TABLET BY MOUTH EVERY DAY AS NEEDED FOR ALLERGIES   nitroGLYCERIN 0.4 MG SL tablet Commonly known as: NITROSTAT Place 1 tablet (0.4 mg total) under the tongue  every 5 (five) minutes as needed for chest pain.   REFRESH OP Place 2 drops into both eyes daily as needed (for dry eyes).   rosuvastatin 20 MG tablet Commonly known as: Crestor Take 1 tablet (20 mg total) by mouth daily.   Spiriva Respimat 2.5 MCG/ACT Aers Generic drug: Tiotropium Bromide Monohydrate Inhale 2 puffs into the lungs daily.   traZODone 50 MG tablet Commonly known as: DESYREL TAKE 1/2 TO 1 TABLET BY MOUTH AT BEDTIME AS NEEDED FOR SLEEP   triamcinolone cream 0.1 % Commonly known as: KENALOG Apply 1 application topically 2 (two) times daily. Use once or twice a day for poison ivy- apply sparingly to face.  Use no longer than 1 week   Trulicity 2.11 HE/1.7EY Sopn Generic drug: Dulaglutide Inject 0.5 mLs (0.75 mg total) into the skin once a week. Started by: Dorita Sciara, MD   Vitamin D3 25 MCG (1000 UT) Caps Take 1,000 Units by mouth daily.        OBJECTIVE:   Vital Signs: BP 124/69 (BP Location: Right Arm, Patient Position: Sitting, Cuff Size: Normal)   Pulse 79   Ht 4\' 11"  (1.499 m)   Wt 185 lb (83.9 kg)   SpO2 97%   BMI 37.37 kg/m   Wt Readings from Last 3 Encounters:  03/14/20 185 lb (83.9 kg)  12/18/19 181 lb (82.1 kg)  12/10/19 181 lb 12.8 oz (82.5 kg)     Exam: General: Pt appears well and is in NAD  Lungs: Clear with good BS bilat with no rales, rhonchi, or wheezes  Heart: RRR with normal S1 and S2 and no gallops; no murmurs; no rub  Abdomen: Normoactive bowel sounds, soft, nontender, without masses or organomegaly palpable  Extremities: No pretibial edema.   Neuro: MS is good with appropriate affect, pt is alert and Ox3    DM foot exam: 12/10/2019  The skin of the feet is intact without sores or ulcerations. The pedal pulses are 2+ on right and 2+ on left. The sensation is decreased  to a screening 5.07, 10  gram monofilament bilaterally   DATA REVIEWED:  Lab Results  Component Value Date   HGBA1C 8.2 (A) 03/14/2020    HGBA1C 7.8 (A) 12/10/2019   HGBA1C 9.6 (H) 09/09/2019   Lab Results  Component Value Date   MICROALBUR 7.8 (H) 01/05/2018   LDLCALC Comment 05/19/2018   CREATININE 0.62 12/18/2019   Lab Results  Component Value Date   MICRALBCREAT 7.5 01/05/2018     Lab Results  Component Value Date   CHOL 145 09/09/2019   HDL 42.60 09/09/2019   [place] Comment 05/19/2018   LDLDIRECT 64.0 09/09/2019   TRIG 363.0 (H) 09/09/2019   CHOLHDL 3 09/09/2019         ASSESSMENT / PLAN / RECOMMENDATIONS:   1) Type 2 Diabetes Mellitus, Sub-Optimally controlled, With neuropathic and macrovascular  complications - Most recent A1c of 8.2 %. Goal A1c < 7.0 %.     - Slight worsening in hyperglycemia, this is probably due to discontinuation of glipizide. We discussed the preference of avoiding the combination of SU and insulin in the setting of age > 82 due to increased risk of hypoglycemia - We discussed add-on therapy with GLP-1 agonist , pt in agreement. No prior hx of pancreatitis. Cautioned against GI side effects     MEDICATIONS:  -Continue Metformin 500 mg , 2 tablet twice daily  - Continue  Farxiga 10 mg , 1 tablet daily in the morning  - Continue  Basaglar 24 units daily  - Start Trulicity 6.83 mg weekly     EDUCATION / INSTRUCTIONS:  BG monitoring instructions: Patient is instructed to check her blood sugars 2 times a day  Call [facility] if: BG persistently < 70 . I reviewed the Rule of 15 for the treatment of hypoglycemia in detail with the patient. Literature supplied.  2) Diabetic complications:   Eye: Does not have known diabetic retinopathy.   Neuro/ Feet: Does  have known diabetic peripheral neuropathy .   Renal: Patient does not have known baseline CKD. She   is  on an ACEI/ARB at present.       F/U in 3 months    Signed electronically by: Mack Guise, MD  [facility] Endocrin[facility]  [facility] [address] [place], [place] [address] Phone: [phone] FAX: [phone]   CC: Darreld Mclean, [address] [place] [address] Phone: [phone]  Fax: [phone]  Return to Endocrinology clinic as below: Future Appointments  Date Time Provider [place]  06/14/2020  9:30 AM Shamleffer, Melanie Crazier, MD [id number]

## 2020-03-14 NOTE — Patient Instructions (Signed)
-   Continue Metformin 500 mg , 2 tablet twice daily  - Continue  Farxiga 10 mg , 1 tablet daily in the morning  - Continue  Basaglar 24 units daily  - Start Trulicity 1.22 mg weekly      HOW TO TREAT LOW BLOOD SUGARS (Blood sugar LESS THAN 70 MG/DL)  Please follow the RULE OF 15 for the treatment of hypoglycemia treatment (when your (blood sugars are less than 70 mg/dL)    STEP 1: Take 15 grams of carbohydrates when your blood sugar is low, which includes:   3-4 GLUCOSE TABS  OR  3-4 OZ OF JUICE OR REGULAR SODA OR  ONE TUBE OF GLUCOSE GEL     STEP 2: RECHECK blood sugar in 15 MINUTES STEP 3: If your blood sugar is still low at the 15 minute recheck --> then, go back to STEP 1 and treat AGAIN with another 15 grams of carbohydrates.

## 2020-03-14 NOTE — Patient Outreach (Signed)
[facility]) Care Management  03/14/2020  Destiny Ballard [birth year] [id number]   [place] other MonthOutreach  Referral Date:12/25/2017 Referral Source:Transfer from [place] Reason for Referral:Continued Disease Management Education Insurance:Medicare   Outreach Attempt:  Outreach attempt #1 to patient for follow up. No answer. RN Health Coach left HIPAA compliant voicemail message along with contact information.  Plan:  RN Health Coach will make another outreach attempt within the month of September if no return call back from patient.   [place] [phone] [email or web address]

## 2020-03-18 ENCOUNTER — Emergency Department (HOSPITAL_BASED_OUTPATIENT_CLINIC_OR_DEPARTMENT_OTHER)
Admission: EM | Admit: 2020-03-18 | Discharge: 2020-03-18 | Disposition: A | Discharge status: Home / Self Care | Payer: Medicare Other | Attending: Emergency Medicine | Admitting: Emergency Medicine

## 2020-03-18 ENCOUNTER — Other Ambulatory Visit: Payer: Self-pay

## 2020-03-18 ENCOUNTER — Encounter (HOSPITAL_BASED_OUTPATIENT_CLINIC_OR_DEPARTMENT_OTHER): Payer: Self-pay | Admitting: Emergency Medicine

## 2020-03-18 ENCOUNTER — Emergency Department (HOSPITAL_BASED_OUTPATIENT_CLINIC_OR_DEPARTMENT_OTHER): Payer: Medicare Other

## 2020-03-18 DIAGNOSIS — J449 Chronic obstructive pulmonary disease, unspecified: Secondary | ICD-10-CM | POA: Insufficient documentation

## 2020-03-18 DIAGNOSIS — Z7984 Long term (current) use of oral hypoglycemic drugs: Secondary | ICD-10-CM | POA: Diagnosis not present

## 2020-03-18 DIAGNOSIS — R109 Unspecified abdominal pain: Secondary | ICD-10-CM

## 2020-03-18 DIAGNOSIS — Z20822 Contact with and (suspected) exposure to covid-19: Secondary | ICD-10-CM | POA: Diagnosis not present

## 2020-03-18 DIAGNOSIS — N390 Urinary tract infection, site not specified: Secondary | ICD-10-CM | POA: Insufficient documentation

## 2020-03-18 DIAGNOSIS — E119 Type 2 diabetes mellitus without complications: Secondary | ICD-10-CM | POA: Diagnosis not present

## 2020-03-18 DIAGNOSIS — Z7722 Contact with and (suspected) exposure to environmental tobacco smoke (acute) (chronic): Secondary | ICD-10-CM | POA: Insufficient documentation

## 2020-03-18 DIAGNOSIS — Z79899 Other long term (current) drug therapy: Secondary | ICD-10-CM | POA: Insufficient documentation

## 2020-03-18 DIAGNOSIS — R197 Diarrhea, unspecified: Secondary | ICD-10-CM | POA: Insufficient documentation

## 2020-03-18 DIAGNOSIS — Z955 Presence of coronary angioplasty implant and graft: Secondary | ICD-10-CM | POA: Insufficient documentation

## 2020-03-18 DIAGNOSIS — N281 Cyst of kidney, acquired: Secondary | ICD-10-CM | POA: Diagnosis not present

## 2020-03-18 DIAGNOSIS — Z7982 Long term (current) use of aspirin: Secondary | ICD-10-CM | POA: Diagnosis not present

## 2020-03-18 DIAGNOSIS — R1031 Right lower quadrant pain: Secondary | ICD-10-CM | POA: Diagnosis present

## 2020-03-18 DIAGNOSIS — N200 Calculus of kidney: Secondary | ICD-10-CM | POA: Diagnosis not present

## 2020-03-18 DIAGNOSIS — R0602 Shortness of breath: Secondary | ICD-10-CM | POA: Diagnosis not present

## 2020-03-18 DIAGNOSIS — R112 Nausea with vomiting, unspecified: Secondary | ICD-10-CM | POA: Diagnosis not present

## 2020-03-18 DIAGNOSIS — I110 Hypertensive heart disease with heart failure: Secondary | ICD-10-CM | POA: Diagnosis not present

## 2020-03-18 DIAGNOSIS — I700 Atherosclerosis of aorta: Secondary | ICD-10-CM | POA: Diagnosis not present

## 2020-03-18 DIAGNOSIS — R0902 Hypoxemia: Secondary | ICD-10-CM | POA: Diagnosis not present

## 2020-03-18 DIAGNOSIS — I5032 Chronic diastolic (congestive) heart failure: Secondary | ICD-10-CM | POA: Insufficient documentation

## 2020-03-18 DIAGNOSIS — N2889 Other specified disorders of kidney and ureter: Secondary | ICD-10-CM | POA: Diagnosis not present

## 2020-03-18 LAB — COMPREHENSIVE METABOLIC PANEL
ALT: 26 U/L (ref 0–44)
AST: 32 U/L (ref 15–41)
Albumin: 4.1 g/dL (ref 3.5–5.0)
Alkaline Phosphatase: 73 U/L (ref 38–126)
Anion gap: 12 (ref 5–15)
BUN: 19 mg/dL (ref 8–23)
CO2: 26 mmol/L (ref 22–32)
Calcium: 9.4 mg/dL (ref 8.9–10.3)
Chloride: 100 mmol/L (ref 98–111)
Creatinine, Ser: 0.72 mg/dL (ref 0.44–1.00)
GFR calc Af Amer: >60 mL/min (ref >60)
GFR calc non Af Amer: >60 mL/min (ref >60)
Glucose, Bld: 258 mg/dL — ABNORMAL HIGH (ref 70–99)
Potassium: 4.5 mmol/L (ref 3.5–5.1)
Sodium: 138 mmol/L (ref 135–145)
Total Bilirubin: 0.6 mg/dL (ref 0.3–1.2)
Total Protein: 8.2 g/dL — ABNORMAL HIGH (ref 6.5–8.1)

## 2020-03-18 LAB — CBC
HCT: 45 % (ref 36.0–46.0)
Hemoglobin: 13.7 g/dL (ref 12.0–15.0)
MCH: 24.6 pg — ABNORMAL LOW (ref 26.0–34.0)
MCHC: 30.4 g/dL (ref 30.0–36.0)
MCV: 80.6 fL (ref 80.0–100.0)
Platelets: 240 10*3/uL (ref 150–400)
RBC: 5.58 MIL/uL — ABNORMAL HIGH (ref 3.87–5.11)
RDW: 14.1 % (ref 11.5–15.5)
WBC: 14.8 10*3/uL — ABNORMAL HIGH (ref 4.0–10.5)
nRBC: 0 % (ref 0.0–0.2)

## 2020-03-18 LAB — URINALYSIS, MICROSCOPIC (REFLEX)

## 2020-03-18 LAB — URINALYSIS, ROUTINE W REFLEX MICROSCOPIC
Bilirubin Urine: NEGATIVE
Glucose, UA: >=500 mg/dL — AB
Hgb urine dipstick: NEGATIVE
Ketones, ur: NEGATIVE mg/dL
Leukocytes,Ua: NEGATIVE
Nitrite: NEGATIVE
Protein, ur: NEGATIVE mg/dL
Specific Gravity, Urine: 1.01 (ref 1.005–1.030)
pH: 5 (ref 5.0–8.0)

## 2020-03-18 LAB — SARS CORONAVIRUS 2 BY RT PCR (HOSPITAL ORDER, PERFORMED IN ~~LOC~~ HOSPITAL LAB): SARS Coronavirus 2: NEGATIVE

## 2020-03-18 LAB — BRAIN NATRIURETIC PEPTIDE: B Natriuretic Peptide: 45.7 pg/mL (ref 0.0–100.0)

## 2020-03-18 LAB — TROPONIN I (HIGH SENSITIVITY): Troponin I (High Sensitivity): 3 ng/L (ref <18)

## 2020-03-18 LAB — LIPASE, BLOOD: Lipase: 35 U/L (ref 11–51)

## 2020-03-18 MED ORDER — CEPHALEXIN 500 MG PO CAPS: 500.0000 mg | ORAL_CAPSULE | Freq: Two times a day (BID) | ORAL | 0 refills | Status: AC

## 2020-03-18 MED ORDER — IOHEXOL 300 MG/ML  SOLN
100.0000 mL | Freq: Once | INTRAMUSCULAR | Status: AC | PRN
  Administered 2020-03-18: 100 mL via INTRAVENOUS

## 2020-03-18 MED ORDER — ONDANSETRON HCL 4 MG/2ML IJ SOLN
4.0000 mg | Freq: Once | INTRAMUSCULAR | Status: AC
  Administered 2020-03-18: 4 mg via INTRAVENOUS
  Filled 2020-03-18: qty 2

## 2020-03-18 MED ORDER — IOHEXOL 350 MG/ML SOLN
100.0000 mL | Freq: Once | INTRAVENOUS | Status: AC | PRN
  Administered 2020-03-18: 75 mL via INTRAVENOUS

## 2020-03-18 MED ORDER — CEPHALEXIN 250 MG PO CAPS
500.0000 mg | ORAL_CAPSULE | Freq: Once | ORAL | Status: AC
  Administered 2020-03-18: 500 mg via ORAL
  Filled 2020-03-18: qty 2

## 2020-03-18 MED ORDER — SODIUM CHLORIDE 0.9 % IV BOLUS
500.0000 mL | Freq: Once | INTRAVENOUS | Status: AC
  Administered 2020-03-18: 500 mL via INTRAVENOUS

## 2020-03-18 MED ORDER — ONDANSETRON 4 MG PO TBDP
4.0000 mg | ORAL_TABLET | Freq: Once | ORAL | Status: AC | PRN
  Administered 2020-03-18: 4 mg via ORAL
  Filled 2020-03-18: qty 1

## 2020-03-18 NOTE — Discharge Instructions (Signed)
Your work-up today did not show evidence of pneumonia, collapsed lung, or blood clot.  Your urine did show some concern for infection, given your abdominal symptoms with nausea and vomiting, the previous team felt it was appropriate to treat you with antibiotics for the UTI.  Please rest and stay hydrated.  Please continue to monitor your breathing as well as you had transient low oxygen saturations.  The previous team had discussion about possible admission for the low oxygen saturations however as you are feeling well, we agreed to let you go home.  Please return to the nearest emergency department if any symptoms change or worsen.

## 2020-03-18 NOTE — ED Provider Notes (Signed)
[facility] Provider Note   CSN: [id number] Arrival date & time: 03/18/20  0602     History Chief Complaint  Patient presents with  . Abdominal Pain  . Emesis    Destiny Ballard is a 69 y.o. female.  HPI      69yo female with history of CAD, COPD, depression, DM, htn, hlpd, OSA who presents with concern for nausea and vomiting.   Woke up at midnight throwing up, threw up probably 6 or more times Fatigue, has been worsening over month No change in cough from baseline COPD No body aches, sore throat, runny nose No dysuria Abdominal pain-feels more sore from vomiting, diffuse pain Diarrhea has been chronic Lower back pain, sciatic nerve in left leg and lower back, spasms-chronic   6AM was the last episode of emesis  No chest pain, no dyspnea  Past Medical History:  Diagnosis Date  . Anxiety    Prior suicide attempt  . CAD (coronary artery disease)    a) s/p DES to LAD 07/2005 b) Last Myoview low risk 11/2011 showing small fixed apical perfusion defect (prior MI vs attenuation) but no ischemia - normal EF.  [name] Cervical spondylosis   . Coronary atherosclerosis 06/28/2008  . Depression   . Depression with anxiety   . Diabetes mellitus without complication ([place])   . GERD (gastroesophageal reflux disease)   . Hyperlipidemia   . Hypertension   . Insulin resistance   . Iron deficiency anemia   . Obesity     Patient Active Problem List   Diagnosis Date Noted  . Uncontrolled type 2 diabetes mellitus with complication, with long-term current use of insulin ([place]) 03/14/2020  . Type 2 diabetes mellitus with hyperglycemia, with long-term current use of insulin ([place]) 12/10/2019  . Type 2 diabetes mellitus with diabetic polyneuropathy, with long-term current use of insulin ([place]) 12/10/2019  . Obesity (BMI 30-39.9) 08/24/2018  . Depression with anxiety 08/24/2018  . CAD (coronary artery disease) 08/24/2018  . Abnormal cardiovascular stress test  08/24/2018  . Lobar pneumonia, unspecified organism ([place]) 11/13/2017  . Precordial chest pain   . Sepsis ([place]) 09/18/2017  . Chronic diastolic CHF (congestive heart failure) ([place]) 09/18/2017  . Diabetes mellitus ([place]) 09/08/2017  . Left hip pain 10/28/2016  . Chronic rhinitis 08/07/2016  . COPD with acute exacerbation ([place]) 04/25/2016  . Essential hypertension 01/30/2016  . Hypokalemia 01/27/2016  . HLD (hyperlipidemia)   . Type 2 diabetes mellitus without complication, without long-term current use of insulin ([place])   . OSA (obstructive sleep apnea) 05/06/2014  . Dyspnea on exertion 09/02/2012  . Chest pain 12/10/2011  . Chronic cough 05/11/2010  . GERD 06/28/2008    Past Surgical History:  Procedure Laterality Date  . BREAST ENHANCEMENT SURGERY    . CARDIAC CATHETERIZATION  06/17/2007   NORMAL. EF 60%  . CARDIAC CATHETERIZATION N/A 01/29/2016   Procedure: Left Heart Cath and Coronary Angiography;  Surgeon: Sherren Mocha, MD;  Location: [facility];  Service: Cardiovascular;  Laterality: N/A;  . CERVICAL SPONDYLOSIS     SINGLE LEVEL FUSION  . CHILDBIRTH     X3  . CORONARY STENT PLACEMENT  07/2005   LEFT ANTERIOR DESCENDING  . FOREARM FRACTURE SURGERY  2010   hand and shoulder   . INCISION AND DRAINAGE BREAST ABSCESS  01/05/2012      . INCISION AND DRAINAGE PERIRECTAL ABSCESS N/A 02/18/2014   Procedure: IRRIGATION AND DEBRIDEMENT PERIRECTAL ABSCESS;  Surgeon: Pedro Earls,  MD;  Location: [facility];  Service: General;  Laterality: N/A;  . LEFT HEART CATH AND CORONARY ANGIOGRAPHY N/A 10/10/2017   Procedure: LEFT HEART CATH AND CORONARY ANGIOGRAPHY;  Surgeon: Burnell Blanks, MD;  Location: [facility];  Service: Cardiovascular;  Laterality: N/A;  . LUMBAR LAMINECTOMY    . ROTATOR CUFF REPAIR     bil[place]  . TONSILLECTOMY AND ADENOIDECTOMY    . TUBAL LIGATION    . VIDEO BRONCHOSCOPY Bilateral 08/13/2016   Procedure: VIDEO BRONCHOSCOPY WITHOUT FLUORO;   Surgeon: Collene Gobble, MD;  Location: [facility];  Service: Cardiopulmonary;  Laterality: Bilateral;     OB History   No obstetric history on file.     Family History  Problem Relation Age of Onset  . Heart attack Mother   . Diabetes Mother   . Lung cancer Mother   . Asthma Mother   . Heart disease Mother   . Suicidality Father        "killed himself"  . Asthma Daughter        x 2  . Cancer Daughter        pre-cancerous polyp  . Diabetes Sister   . Cancer Sister   . Cervical cancer Daughter        cervical   . Allergies Other        all family--seasonal allergies    Social History   Tobacco Use  . Smoking status: Passive Smoke Exposure - Never Smoker  . Smokeless tobacco: Never Used  Vaping Use  . Vaping Use: Never used  Substance Use Topics  . Alcohol use: Yes    Comment: occ  . Drug use: No    Home Medications Prior to Admission medications   Medication Sig Start Date End Date Taking? Authorizing Provider  albuterol (VENTOLIN HFA) 108 (90 Base) MCG/ACT inhaler INHALE 2 PUFFS INTO THE LUNGS EVERY 8 (EIGHT) HOURS AS NEEDED FOR WHEEZING OR SHORTNESS OF BREATH 01/25/20   Copland, Gay Filler, MD  aspirin EC 81 MG tablet Take 81 mg by mouth daily.    [provider]  calcium carbonate (TUMS - DOSED IN MG ELEMENTAL CALCIUM) 500 MG chewable tablet Chew 2 tablets by mouth daily as needed for indigestion or heartburn.    [provider]  cephALEXin (KEFLEX) 500 MG capsule Take 1 capsule (500 mg total) by mouth 2 (two) times daily for 7 days. 03/18/20 03/25/20  Tegeler, Gwenyth Allegra, MD  cetirizine (ZYRTEC) 10 MG tablet TAKE 1 TABLET BY MOUTH DAILY AS NEEDED FOR ALLERGIES 12/16/17   Copland, Gay Filler, MD  Cholecalciferol (VITAMIN D3) 1000 UNITS CAPS Take 1,000 Units by mouth daily.     [provider]  dapagliflozin propanediol (FARXIGA) 10 MG TABS tablet Take 1 tablet (10 mg total) by mouth daily. 12/10/19   Shamleffer, Melanie Crazier, MD    Dulaglutide (TRULICITY) 1.51 VO/1.6WV SOPN Inject 0.5 mLs (0.75 mg total) into the skin once a week. 03/14/20   Shamleffer, Melanie Crazier, MD  FLUoxetine (PROZAC) 40 MG capsule TAKE 1 CAPSULE BY MOUTH EVERY DAY 02/28/20   Copland, Gay Filler, MD  fluticasone (FLONASE) 50 MCG/ACT nasal spray SPRAY 2 SPRAYS INTO EACH NOSTRIL EVERY DAY 06/08/18   Collene Gobble, MD  furosemide (LASIX) 20 MG tablet TAKE 1 TABLET BY MOUTH EVERY DAY 07/28/19   Burtis Junes, NP  gabapentin (NEURONTIN) 300 MG capsule TAKE 2 CAPSULES BY MOUTH TWICE A DAY 03/16/19   Copland, Gay Filler, MD  hydrALAZINE (APRESOLINE) 25 MG tablet Patient needs to schedule appointment for any future refills.  Please call [phone].  1st attempt. 02/07/20   Burtis Junes, NP  hydrOXYzine (ATARAX/VISTARIL) 25 MG tablet TAKE 1/2 TO 1 TABLET BY MOUTH EVERY 8 HOURS AS NEEDED FOR ITCHING 12/22/19   Copland, Gay Filler, MD  Insulin Glargine (BASAGLAR KWIKPEN) 100 UNIT/ML Inject 0.24 mLs (24 Units total) into the skin daily. 12/10/19   Shamleffer, Melanie Crazier, MD  Insulin Pen Needle 32G X 4 MM MISC 1 Device by Does not apply route as directed. 12/10/19   Shamleffer, Melanie Crazier, MD  losartan (COZAAR) 100 MG tablet Take 1 tablet (100 mg total) by mouth daily. 09/10/17   Burtis Junes, NP  metFORMIN (GLUCOPHAGE-XR) 500 MG 24 hr tablet Take 2 tablets (1,000 mg total) by mouth 2 (two) times daily. 12/10/19   Shamleffer, Melanie Crazier, MD  methocarbamol (ROBAXIN) 750 MG tablet TAKE 1 TABLET (750 MG TOTAL) BY MOUTH EVERY 12 (TWELVE) HOURS AS NEEDED FOR MUSCLE SPASMS. 08/03/19   Copland, Gay Filler, MD  metoprolol succinate (TOPROL-XL) 100 MG 24 hr tablet TAKE 1 TABLET BY MOUTH EVERY DAY 12/10/19   Copland, Gay Filler, MD  montelukast (SINGULAIR) 10 MG tablet TAKE 1 TABLET BY MOUTH EVERY DAY AS NEEDED FOR ALLERGIES 10/20/19   Copland, Gay Filler, MD  nitroGLYCERIN (NITROSTAT) 0.4 MG SL tablet Place 1 tablet (0.4 mg total) under the tongue every 5 (five)  minutes as needed for chest pain. 09/01/17   Copland, Gay Filler, MD  Polyvinyl Alcohol-Povidone (REFRESH OP) Place 2 drops into both eyes daily as needed (for dry eyes).    [provider]  rosuvastatin (CRESTOR) 20 MG tablet Take 1 tablet (20 mg total) by mouth daily. 05/03/19   Copland, Gay Filler, MD  Tiotropium Bromide Monohydrate (SPIRIVA RESPIMAT) 2.5 MCG/ACT AERS Inhale 2 puffs into the lungs daily. 10/19/19   Colon Branch, MD  traZODone (DESYREL) 50 MG tablet TAKE 1/2 TO 1 TABLET BY MOUTH AT BEDTIME AS NEEDED FOR SLEEP 01/18/20   Copland, Gay Filler, MD  triamcinolone cream (KENALOG) 0.1 % Apply 1 application topically 2 (two) times daily. Use once or twice a day for poison ivy- apply sparingly to face.  Use no longer than 1 week 11/26/18   Copland, Gay Filler, MD    Allergies    Prednisone  Review of Systems   Review of Systems  Constitutional: Positive for fatigue. Negative for fever.  HENT: Negative for congestion and sore throat.   Eyes: Negative for visual disturbance.  Respiratory: Negative for cough and shortness of breath (reports no change).   Cardiovascular: Negative for chest pain.  Gastrointestinal: Positive for diarrhea, nausea and vomiting. Negative for abdominal pain and constipation.  Genitourinary: Negative for difficulty urinating and dysuria.  Musculoskeletal: Negative for back pain and neck pain.  Skin: Negative for rash.  Neurological: Negative for syncope and headaches.    Physical Exam Updated Vital Signs BP 136/65 (BP Location: Right Arm)   Pulse 100   Temp 98.5 F (36.9 C) (Oral)   Resp 18   Ht 4\' 11"  (1.499 m)   Wt 83.9 kg   SpO2 90%   BMI 37.37 kg/m   Physical Exam Vitals and nursing note reviewed.  Constitutional:      General: She is not in acute distress.    Appearance: She is well-developed. She is not diaphoretic.  HENT:     Head: Normocephalic and atraumatic.  Eyes:  Conjunctiva/sclera: Conjunctivae normal.  Cardiovascular:      Rate and Rhythm: Normal rate and regular rhythm.     Heart sounds: Normal heart sounds. No murmur heard.  No friction rub. No gallop.   Pulmonary:     Effort: Pulmonary effort is normal. No respiratory distress.     Breath sounds: Normal breath sounds. No wheezing or rales.  Abdominal:     General: There is no distension.     Palpations: Abdomen is soft.     Tenderness: There is abdominal tenderness (mild diffuse, worse right sided, negative murphy's). There is no guarding.  Musculoskeletal:        General: No tenderness.     Cervical back: Normal range of motion.  Skin:    General: Skin is warm and dry.     Findings: No erythema or rash.  Neurological:     Mental Status: She is alert and oriented to person, place, and time.     ED Results / Procedures / Treatments   Labs (all labs ordered are listed, but only abnormal results are displayed) Labs Reviewed  COMPREHENSIVE METABOLIC PANEL - Abnormal; Notable for the following components:      Result Value   Glucose, Bld 258 (*)    Total Protein 8.2 (*)    All other components within normal limits  CBC - Abnormal; Notable for the following components:   WBC 14.8 (*)    RBC 5.58 (*)    MCH 24.6 (*)    All other components within normal limits  URINALYSIS, ROUTINE W REFLEX MICROSCOPIC - Abnormal; Notable for the following components:   Glucose, UA >=500 (*)    All other components within normal limits  URINALYSIS, MICROSCOPIC (REFLEX) - Abnormal; Notable for the following components:   Bacteria, UA MANY (*)    All other components within normal limits  SARS CORONAVIRUS 2 BY RT PCR (HOSPITAL ORDER, [facility])  LIPASE, BLOOD  BRAIN NATRIURETIC PEPTIDE  TROPONIN I (HIGH SENSITIVITY)    EKG EKG Interpretation  Date/Time:  Saturday March 18 2020 15:28:03 EDT Ventricular Rate:  101 PR Interval:    QRS Duration: 88 QT Interval:  355 QTC Calculation: 461 R Axis:   42 Text  Interpretation: Sinus tachycardia Low voltage, precordial leads No significant change since last tracing Confirmed by Gareth Morgan [phone]) on 03/18/2020 3:46:13 PM   Radiology CT Angio Chest PE W and/or Wo Contrast  Result Date: 03/18/2020 CLINICAL DATA:  PE suspected, high prob Shortness of breath and hypoxia. EXAM: CT ANGIOGRAPHY CHEST WITH CONTRAST TECHNIQUE: Multidetector CT imaging of the chest was performed using the standard protocol during bolus administration of intravenous contrast. Multiplanar CT image reconstructions and MIPs were obtained to evaluate the vascular anatomy. CONTRAST:  5mL OMNIPAQUE IOHEXOL 350 MG/ML SOLN COMPARISON:  Radiograph earlier this day. Lung bases from abdominal CT earlier this day. Chest CTA 12/24/2018 FINDINGS: Cardiovascular: There are no filling defects within the pulmonary arteries to suggest pulmonary embolus. Atherosclerosis of the thoracic aorta without dissection or aneurysm. There is a conventional branching pattern from the aortic arch. Coronary artery calcifications. Heart is normal in size. No pericardial effusion. Mediastinum/Nodes: Scattered small mediastinal lymph nodes not enlarged by size criteria. There is no hilar adenopathy. Scattered axillary nodes are normal in size and retain normal fatty hila. No suspicious axillary adenopathy. No esophageal wall thickening. No suspicious thyroid nodule. Lungs/Pleura: Linear subsegmental opacities in both lower lobes consistent with atelectasis. No pneumonia or confluent airspace disease.  There is mild central bronchial thickening. No pleural fluid or findings of pulmonary edema. No pulmonary mass or suspicious nodule. Upper Abdomen: Assessed on abdominal CT earlier today. No interval change. Musculoskeletal: There are no acute or suspicious osseous abnormalities. Remote bilateral rib fracture. Bilateral breast implants with stable deformity of the posterior right implant from prior exam. Right implant capsular  calcifications are also unchanged. Review of the MIP images confirms the above findings. IMPRESSION: 1. No pulmonary embolus. 2. Mild central bronchial thickening. Linear subsegmental opacities in both lower lobes consistent with atelectasis. 3. Coronary artery calcifications. Aortic Atherosclerosis (ICD10-I70.0). Electronically Signed   By: Keith Rake M.D.   On: 03/18/2020 15:54   CT ABDOMEN PELVIS W CONTRAST  Result Date: 03/18/2020 CLINICAL DATA:  Abdominal abscess/infection suspected. EXAM: CT ABDOMEN AND PELVIS WITH CONTRAST TECHNIQUE: Multidetector CT imaging of the abdomen and pelvis was performed using the standard protocol following bolus administration of intravenous contrast. CONTRAST:  152mL OMNIPAQUE IOHEXOL 300 MG/ML  SOLN COMPARISON:  August 23, 2018 FINDINGS: Lower chest: No acute abnormality. Stable small posterior fat containing right diaphragmatic hernia. Hepatobiliary: No focal liver abnormality is seen. No gallstones, gallbladder wall thickening, or biliary dilatation. Pancreas: Unremarkable. No pancreatic ductal dilatation or surrounding inflammatory changes. Spleen: Normal in size without focal abnormality. Adrenals/Urinary Tract: Normal adrenal glands. 3.5 x 3.8 by 3.6 cm partially exophytic right upper pole renal mass, slightly enlarged. Exophytic circumscribed low-density mass off of the lower medial pole of the right kidney measures 4.5 cm. The previously noted calculus in the lower pole of the left kidney is no longer present. Tiny nonobstructive 1-2 mm calculus is present in the lower pole of the right kidney. There are bilateral too small to be actually characterized hypoattenuated nodules in bilateral kidneys, possibly representing cysts. Stomach/Bowel: Stomach is within normal limits. No evidence of appendicitis. No evidence of bowel wall thickening, distention, or inflammatory changes. Vascular/Lymphatic: Aortic atherosclerosis. No enlarged abdominal or pelvic lymph nodes.  Reproductive: Uterus and bilateral adnexa are unremarkable. Other: No abdominal wall hernia or abnormality. No abdominopelvic ascites. Musculoskeletal: No acute or significant osseous findings. IMPRESSION: 1. No evidence of abdominal abscess. 2. 3.5 cm partially exophytic right upper pole renal mass, slightly enlarged from the prior exam. According to abdominal MRI from February 28, 2018, this represents a complicated cyst. 3. 4.5 cm exophytic low-density mass off of the lower medial pole of the right kidney, stable and also representing a complicated cyst according to the above-stated MRI. 4. Tiny nonobstructive 1-2 mm calculus in the lower pole of the right kidney. Aortic Atherosclerosis (ICD10-I70.0). Electronically Signed   By: Fidela Salisbury M.D.   On: 03/18/2020 12:30   DG Chest Portable 1 View  Result Date: 03/18/2020 CLINICAL DATA:  Shortness of breath for 1 year but worse over the last month. COPD. EXAM: PORTABLE CHEST 1 VIEW COMPARISON:  05/19/2019 FINDINGS: Remote left rib fractures. Midline trachea. Normal heart size. Atherosclerosis in the transverse aorta. Borderline right paratracheal soft tissue fullness is similar back to 09/29/2017, likely due to prominent great vessels. Breast tissue projects over the lung bases. No convincing evidence of pleural fluid. No pneumothorax. Clear lungs. IMPRESSION: No acute cardiopulmonary disease. Aortic Atherosclerosis (ICD10-I70.0). Electronically Signed   By: Abigail Miyamoto M.D.   On: 03/18/2020 14:26    Procedures Procedures (including critical care time)  Medications Ordered in ED Medications  ondansetron (ZOFRAN-ODT) disintegrating tablet 4 mg (4 mg Oral Given 03/18/20 0658)  iohexol (OMNIPAQUE) 300 MG/ML solution 100 mL (100 mLs  Intravenous Contrast Given 03/18/20 1157)  sodium chloride 0.9 % bolus 500 mL (0 mLs Intravenous Stopped 03/18/20 1723)  iohexol (OMNIPAQUE) 350 MG/ML injection 100 mL (75 mLs Intravenous Contrast Given 03/18/20 1538)   cephALEXin (KEFLEX) capsule 500 mg (500 mg Oral Given 03/18/20 1600)  ondansetron (ZOFRAN) injection 4 mg (4 mg Intravenous Given 03/18/20 1600)    ED Course  I have reviewed the triage vital signs and the nursing notes.  Pertinent labs & imaging results that were available during my care of the patient were reviewed by me and considered in my medical decision making (see chart for details).    MDM Rules/Calculators/A&P                          69yo female with history of CAD, COPD, depression, DM, htn, hlpd, OSA who presents with concern for nausea, vomiting and abdominal pain. DDx includes appendicitis, pancreatitis, cholecystitis, pyelonephritis, nephrolithiasis, diverticulitis, SBO.  CT abdomen pelvis showed no acute abnoramlities, showed findings consistent with renal mass/seen as complicated cyst on right kidney on prior MRI. No sign of acute cholecystitis and negative Murphy's sign on exam.   Urine questionable for UTI, will plan on treatment given nausea/vomiting.  Noted to have oxygen saturation to 89%. On records reviewed, she has had saturation 95-97% as an outpatient and reports her O2 saturations have always been normal-- although she does state they had discussed and she had declined home oxygen previously as she did not want to carry it around with her.  Given apparently new hypoxia ordered additional testing. COVID 19 testing negative.  CXR without acute abnormalities. Troponin/BNP pending as well as CT PE study ordered given concern for apparently new hypoxia, mild tachycardia.  If no sign of pneumonia, pulmonary edema, PE, or COVID on CT would consider this as possible baseline COPD oxygen saturation. She denies any shortness of breath, no increased cough, no wheezing on exam. Discussed possible admission to help arrange home O2, however given pt asymptomatic from dyspnea standpoint (no worsening) with mild hypoxia that can be seen with COPD do not feel outpatient discussion is  unreasonable if no acute/reversible explanation on CT. Signed out to Dr. Sherry Ruffing with continued monitoring and CT/labs pending.   Final Clinical Impression(s) / ED Diagnoses Final diagnoses:  Nausea and vomiting, intractability of vomiting not specified, unspecified vomiting type  Lower urinary tract infectious disease  Abdominal pain, unspecified abdominal location  Hypoxia    Rx / DC Orders ED Discharge Orders         Ordered    cephALEXin (KEFLEX) 500 MG capsule  2 times daily        03/18/20 1727           Gareth Morgan, MD 03/18/20 1743

## 2020-03-18 NOTE — ED Triage Notes (Signed)
Pt states she has been vomiting since midnight   Pt is c/o abd pain and states she has had some diarrhea

## 2020-03-18 NOTE — ED Provider Notes (Signed)
3:45 PM Care assumed from Dr. Billy Fischer.  At time of transfer of care, patient is awaiting results of troponin, EKG, BNP, and PE study given the transient hypoxia and exertional shortness of breath.  Her work-up thus far did not show evidence of concerning intra-abdominal pathology but did show some mild evidence of urinary tract infection.  If patient does not remain severely hypoxic and is feeling better and has reassuring work-up, dissipate discharge with written for antibiotics for mild UTI causing the nausea, vomiting, abdominal discomfort, and her COPD likely contributing to her transient hypoxia.  Previous provider offered admission to the patient initially due to the hypoxia however patient would like to go home so she can go to the beach.  Anticipate shared decision-making conversation after work-up is completed.  5:26 PM CT scan did not show pulmonary ballismus or pneumonia.  Troponin and BNP are reassuring.  Patient will be given prescription for Keflex for UTI and will follow up with PCP.  Her oxygen saturations remained above 90 during my observation of the patient.  She would like to go home and did not want to discuss admission.  Patient will be discharged home as per previous plan.  They understand return precautions for any new or worsened symptoms.    Clinical Impression: 1. Nausea and vomiting, intractability of vomiting not specified, unspecified vomiting type   2. Lower urinary tract infectious disease   3. Abdominal pain, unspecified abdominal location   4. Hypoxia     Disposition: Discharge  Condition: Good  I have discussed the results, Dx and Tx plan with the pt(& family if present). He/she/they expressed understanding and agree(s) with the plan. Discharge instructions discussed at great length. Strict return precautions discussed and pt &/or family have verbalized understanding of the instructions. No further questions at time of discharge.    New  Prescriptions   CEPHALEXIN (KEFLEX) 500 MG CAPSULE    Take 1 capsule (500 mg total) by mouth 2 (two) times daily for 7 days.    Follow Up: Darreld Mclean, MD [address] [place] [address] [phone]     [facility] [address] [id number] [address] [place] [id number]        Anan Dapolito, Gwenyth Allegra, MD 03/18/20 [phone]

## 2020-04-03 ENCOUNTER — Other Ambulatory Visit: Payer: Self-pay | Admitting: Family Medicine

## 2020-04-07 ENCOUNTER — Other Ambulatory Visit: Payer: Self-pay | Admitting: *Deleted

## 2020-04-07 NOTE — Patient Outreach (Signed)
[facility]) Care Management  [place]  04/07/2020   MOIRA UMHOLTZ [birth year] [id number]   [place] other MonthOutreach  Referral Date:12/25/2017 Referral Source:Transfer from [facility] Reason for Referral:Continued Disease Management Education Insurance:Medicare   Outreach Attempt:  Outreach attempt #2 to patient for follow up.  Multiple call attempts and line busy each time; unable to leave voicemail message.   Plan:  RN Health Coach will make another outreach attempt within the month of October.   [place] [phone] [email or web address]

## 2020-05-04 ENCOUNTER — Encounter: Payer: Self-pay | Admitting: *Deleted

## 2020-05-04 ENCOUNTER — Other Ambulatory Visit: Payer: Self-pay | Admitting: *Deleted

## 2020-05-04 NOTE — Patient Outreach (Signed)
[facility]) Care Management  [place]  05/04/2020   Destiny Ballard 1950/07/19 [id number]   Continental other MonthOutreach  Referral Date:12/25/2017 Referral Source:Transfer from [place] Reason for Referral:Continued Disease Management Education Insurance:Medicare   Outreach Attempt:  Successful telephone outreach to patient for follow up.  HIPAA verified with patient.  Patient reporting she is doing fair.  States she is having some shortness of breath with the change of weather, using her rescue inhaler about once a day.  Encouraged patient to contact provider. Fasting blood sugar this morning was 181 with recent fasting ranges of 140-180's.  Latest Hgb A1C increased to 8.2.  Admits to not having best diet and choosing high carb snacks.  Discussed importance of COVID and Flu vaccines and encouraged patient to discuss with providers.  Patient also reporting her Trulicity prescription is $200 and she will not be able to afford.  Says she has supply for one more injection.  [place] referral discussed and patient agrees.  Encounter Medications:  Outpatient Encounter Medications as of 05/04/2020  Medication Sig Note  . albuterol (VENTOLIN HFA) 108 (90 Base) MCG/ACT inhaler INHALE 2 PUFFS INTO THE LUNGS EVERY 8 (EIGHT) HOURS AS NEEDED FOR WHEEZING OR SHORTNESS OF BREATH   . aspirin EC 81 MG tablet Take 81 mg by mouth daily.   . calcium carbonate (TUMS - DOSED IN MG ELEMENTAL CALCIUM) 500 MG chewable tablet Chew 2 tablets by mouth daily as needed for indigestion or heartburn.   . cetirizine (ZYRTEC) 10 MG tablet TAKE 1 TABLET BY MOUTH DAILY AS NEEDED FOR ALLERGIES   . Cholecalciferol (VITAMIN D3) 1000 UNITS CAPS Take 1,000 Units by mouth daily.    . dapagliflozin propanediol (FARXIGA) 10 MG TABS tablet Take 1 tablet (10 mg total) by mouth daily.   . Dulaglutide (TRULICITY) 9.48 NI/6.2VO SOPN Inject 0.5 mLs (0.75 mg total) into  the skin once a week.   [name] FLUoxetine (PROZAC) 40 MG capsule TAKE 1 CAPSULE BY MOUTH EVERY DAY   . fluticasone (FLONASE) 50 MCG/ACT nasal spray SPRAY 2 SPRAYS INTO EACH NOSTRIL EVERY DAY   . furosemide (LASIX) 20 MG tablet TAKE 1 TABLET BY MOUTH EVERY DAY   . gabapentin (NEURONTIN) 300 MG capsule TAKE 2 CAPSULES BY MOUTH TWICE A DAY   . hydrALAZINE (APRESOLINE) 25 MG tablet Patient needs to schedule appointment for any future refills.  Please call [phone].  1st attempt.   . Insulin Glargine (BASAGLAR KWIKPEN) 100 UNIT/ML Inject 0.24 mLs (24 Units total) into the skin daily.   [name] losartan (COZAAR) 100 MG tablet Take 1 tablet (100 mg total) by mouth daily.   . metFORMIN (GLUCOPHAGE-XR) 500 MG 24 hr tablet Take 2 tablets (1,000 mg total) by mouth 2 (two) times daily.   . metoprolol succinate (TOPROL-XL) 100 MG 24 hr tablet TAKE 1 TABLET BY MOUTH EVERY DAY   . montelukast (SINGULAIR) 10 MG tablet TAKE 1 TABLET BY MOUTH EVERY DAY AS NEEDED FOR ALLERGIES   . rosuvastatin (CRESTOR) 20 MG tablet Take 1 tablet (20 mg total) by mouth daily.   . Tiotropium Bromide Monohydrate (SPIRIVA RESPIMAT) 2.5 MCG/ACT AERS Inhale 2 puffs into the lungs daily.   . traZODone (DESYREL) 50 MG tablet TAKE 1/2 TO 1 TABLET BY MOUTH AT BEDTIME AS NEEDED FOR SLEEP   . hydrOXYzine (ATARAX/VISTARIL) 25 MG tablet TAKE 1/2 TO 1 TABLET BY MOUTH EVERY 8 HOURS AS NEEDED FOR ITCHING   . Insulin Pen Needle  32G X 4 MM MISC 1 Device by Does not apply route as directed.   . methocarbamol (ROBAXIN) 750 MG tablet TAKE 1 TABLET (750 MG TOTAL) BY MOUTH EVERY 12 (TWELVE) HOURS AS NEEDED FOR MUSCLE SPASMS.   . nitroGLYCERIN (NITROSTAT) 0.4 MG SL tablet Place 1 tablet (0.4 mg total) under the tongue every 5 (five) minutes as needed for chest pain. 10/19/2019: PRN  . Polyvinyl Alcohol-Povidone (REFRESH OP) Place 2 drops into both eyes daily as needed (for dry eyes).   . triamcinolone cream (KENALOG) 0.1 % Apply 1 application topically 2 (two)  times daily. Use once or twice a day for poison ivy- apply sparingly to face.  Use no longer than 1 week    No facility-administered encounter medications on file as of 05/04/2020.    Functional Status:  In your present state of health, do you have any difficulty performing the following activities: 01/06/2020  Hearing? Y  Comment trouble hearing out of left ear  Vision? N  Difficulty concentrating or making decisions? N  Walking or climbing stairs? Y  Comment difficulties climbing stairs  Dressing or bathing? N  Doing errands, shopping? N  Preparing Food and eating ? N  Using the Toilet? N  In the past six months, have you accidently leaked urine? Y  Comment some urinary incontinence  Do you have problems with loss of bowel control? Y  Comment wears depends  Managing your Medications? N  Managing your Finances? N  Housekeeping or managing your Housekeeping? N  Some recent data might be hidden    Fall/Depression Screening: Fall Risk  05/04/2020 01/06/2020 08/06/2019  Falls in the past year? 1 1 1   Comment no falls since April 2021-falling down stairs and hurting tail bone Fall in April 2021 last fall 04/2019  Number falls in past yr: 1 1 1   Injury with Fall? 1 1 1   Comment - - -  Risk for fall due to : History of fall(s);Impaired balance/gait;Impaired mobility;Impaired vision;Medication side effect History of fall(s);Medication side effect;Impaired balance/gait;Impaired mobility;Impaired vision;Orthopedic patient History of fall(s);Medication side effect;Impaired balance/gait;Impaired mobility  Follow up Falls evaluation completed;Education provided;Falls prevention discussed Falls evaluation completed;Education provided;Falls prevention discussed Falls prevention discussed;Education provided;Falls evaluation completed   PHQ 2/9 Scores 01/06/2020 12/29/2018 02/03/2018 10/24/2017 09/01/2017 08/27/2016 09/20/2015  PHQ - 2 Score 6 2 2 6 6 3  0  PHQ- 9 Score 13 7 6 9 21 15  -  Exception  Documentation - - - - - - Patient refusal    Goals Addressed            This Visit's Progress   . [facility]) Eat Healthy       Follow Up Date 09/11/20   - fill half of plate with vegetables - manage portion size - prepare main meal at home 3 to 5 days each week - read food labels for fat, fiber, carbohydrates and portion size - set a realistic goal -decrease snacking on chips and choose healthier snack options like celery or carrots    Why is this important?   When you are ready to manage your nutrition or weight, having a plan and setting goals will help.  Taking small steps to change how you eat and exercise is a good place to start.    Notes:     . [facility]) Learn More About My Health       Follow Up Date 07/13/20   - tell my story and reason for my visit - make a list  of questions - ask questions - repeat what I heard to make sure I understand - bring a list of my medicines to the visit - speak up when I don't understand    Why is this important?   The best way to learn about your health and care is by talking to the doctor and nurse.  They will answer your questions and give you information in the way that you like best.    Notes:     . [facility]) Make and Keep All Appointments       Follow Up Date 07/13/20   - ask family or friend for a ride - call to cancel if needed - keep a calendar with appointment dates -schedule follow up appointment with PCP -discuss with provider Diabetes and Nutrition referral for education    Why is this important?   Part of staying healthy is seeing the doctor for follow-up care.  If you forget your appointments, there are some things you can do to stay on track.    Notes:     . [facility]) Monitor and Manage My Blood Sugar       Follow Up Date 09/11/20   - check blood sugar at prescribed times - enter blood sugar readings and medication or insulin into daily log - take the blood sugar log to all doctor visits - take the blood sugar meter to  all doctor visits -notify provider for sustained elevations -work with [place] for medication assistance for Trulicity and Spiriva    Why is this important?   Checking your blood sugar at home helps to keep it from getting very high or very low.  Writing the results in a diary or log helps the doctor know how to care for you.  Your blood sugar log should have the time, date and the results.  Also, write down the amount of insulin or other medicine that you take.  Other information, like what you ate, exercise done and how you were feeling, will also be helpful.     Notes:     . [facility]) Set My Target A1C       Follow Up Date 09/11/20    - set target A1C; Target is 6, current is 8.2    Why is this important?   Your target A1C is decided together by you and your doctor.  It is based on several things like your age and other health issues.    Notes:     . [facility]) Track and Manage My Symptoms       Follow Up Date 07/13/20   - develop a rescue plan - eliminate symptom triggers at home - follow rescue plan if symptoms flare-up - keep follow-up appointments    Why is this important?   Tracking your symptoms and other information about your health helps your doctor plan your care.  Write down the symptoms, the time of day, what you were doing and what medicine you are taking.  You will soon learn how to manage your symptoms.     Notes:       Appointments:  Has not attended appointment with primary care provider since April 2021 and encouraged patient to call and arrange appointment.  Has follow up appointment with Endocrinologist on 06/13/2020.  Plan: RN Health Coach will send patient EMMI Diabetes Diet education to her mychart. RN Health Coach will send primary care provider quarterly update. RN Health Coach will make next telephone outreach to patient within  the month of December and patient agrees to future outreach.  [place] [phone] [email or web address]

## 2020-05-04 NOTE — Patient Instructions (Addendum)
Goals Addressed            This Visit's Progress   . [facility]) Eat Healthy       Follow Up Date 09/11/20   - fill half of plate with vegetables - manage portion size - prepare main meal at home 3 to 5 days each week - read food labels for fat, fiber, carbohydrates and portion size - set a realistic goal -decrease snacking on chips and choose healthier snack options like celery or carrots    Why is this important?   When you are ready to manage your nutrition or weight, having a plan and setting goals will help.  Taking small steps to change how you eat and exercise is a good place to start.    Notes:     . [facility]) Learn More About My Health       Follow Up Date 07/13/20   - tell my story and reason for my visit - make a list of questions - ask questions - repeat what I heard to make sure I understand - bring a list of my medicines to the visit - speak up when I don't understand    Why is this important?   The best way to learn about your health and care is by talking to the doctor and nurse.  They will answer your questions and give you information in the way that you like best.    Notes:     . [facility]) Make and Keep All Appointments       Follow Up Date 07/13/20   - ask family or friend for a ride - call to cancel if needed - keep a calendar with appointment dates -schedule follow up appointment with PCP -discuss with provider Diabetes and Nutrition referral for education    Why is this important?   Part of staying healthy is seeing the doctor for follow-up care.  If you forget your appointments, there are some things you can do to stay on track.    Notes:     . [facility]) Monitor and Manage My Blood Sugar       Follow Up Date 09/11/20   - check blood sugar at prescribed times - enter blood sugar readings and medication or insulin into daily log - take the blood sugar log to all doctor visits - take the blood sugar meter to all doctor visits -notify provider for sustained  elevations -work with [place] for medication assistance for Trulicity and Spiriva    Why is this important?   Checking your blood sugar at home helps to keep it from getting very high or very low.  Writing the results in a diary or log helps the doctor know how to care for you.  Your blood sugar log should have the time, date and the results.  Also, write down the amount of insulin or other medicine that you take.  Other information, like what you ate, exercise done and how you were feeling, will also be helpful.     Notes:     . [facility]) Set My Target A1C       Follow Up Date 09/11/20    - set target A1C; Target is 6, current is 8.2    Why is this important?   Your target A1C is decided together by you and your doctor.  It is based on several things like your age and other health issues.    Notes:     [name] (  [facility]) Track and Manage My Symptoms       Follow Up Date 07/13/20   - develop a rescue plan - eliminate symptom triggers at home - follow rescue plan if symptoms flare-up - keep follow-up appointments    Why is this important?   Tracking your symptoms and other information about your health helps your doctor plan your care.  Write down the symptoms, the time of day, what you were doing and what medicine you are taking.  You will soon learn how to manage your symptoms.     Notes:

## 2020-05-05 NOTE — Patient Outreach (Signed)
Received a Pharmacy referral. Primary Care Physician is using [facility] services.   I have sent an email referral to: [facility] Day with [facility]. .

## 2020-05-10 ENCOUNTER — Telehealth: Payer: Self-pay | Admitting: Pharmacist

## 2020-05-10 NOTE — Progress Notes (Addendum)
Chronic Care Management Pharmacy Assistant   Name: Destiny Ballard  MRN: [id number] DOB: [birth year]  Reason for Encounter: Medication Review/ Initial    Patient Questions:  1.  Have you seen any other providers since your last visit? No  2.  Any changes in your medicines or health? No    PCP : Ballard, Destiny Filler, MD  Allergies:   Allergies  Allergen Reactions   Prednisone Other (See Comments)    REACTION: mood swings, nightmares. "Shot doesn't bother me, reaction is just with the pill" she states she has had the steroid injections before. From our records methylprednisone was given to her in 2013 without any complications.    Medications: Outpatient Encounter Medications as of 05/10/2020  Medication Sig Note   albuterol (VENTOLIN HFA) 108 (90 Base) MCG/ACT inhaler INHALE 2 PUFFS INTO THE LUNGS EVERY 8 (EIGHT) HOURS AS NEEDED FOR WHEEZING OR SHORTNESS OF BREATH    aspirin EC 81 MG tablet Take 81 mg by mouth daily.    calcium carbonate (TUMS - DOSED IN MG ELEMENTAL CALCIUM) 500 MG chewable tablet Chew 2 tablets by mouth daily as needed for indigestion or heartburn.    cetirizine (ZYRTEC) 10 MG tablet TAKE 1 TABLET BY MOUTH DAILY AS NEEDED FOR ALLERGIES    Cholecalciferol (VITAMIN D3) 1000 UNITS CAPS Take 1,000 Units by mouth daily.     dapagliflozin propanediol (FARXIGA) 10 MG TABS tablet Take 1 tablet (10 mg total) by mouth daily.    Dulaglutide (TRULICITY) 8.11 BJ/4.7WG SOPN Inject 0.5 mLs (0.75 mg total) into the skin once a week.    FLUoxetine (PROZAC) 40 MG capsule TAKE 1 CAPSULE BY MOUTH EVERY DAY    fluticasone (FLONASE) 50 MCG/ACT nasal spray SPRAY 2 SPRAYS INTO EACH NOSTRIL EVERY DAY    furosemide (LASIX) 20 MG tablet TAKE 1 TABLET BY MOUTH EVERY DAY    gabapentin (NEURONTIN) 300 MG capsule TAKE 2 CAPSULES BY MOUTH TWICE A DAY    hydrALAZINE (APRESOLINE) 25 MG tablet Patient needs to schedule appointment for any future refills.  Please call [phone].  1st attempt.     hydrOXYzine (ATARAX/VISTARIL) 25 MG tablet TAKE 1/2 TO 1 TABLET BY MOUTH EVERY 8 HOURS AS NEEDED FOR ITCHING    Insulin Glargine (BASAGLAR KWIKPEN) 100 UNIT/ML Inject 0.24 mLs (24 Units total) into the skin daily.    Insulin Pen Needle 32G X 4 MM MISC 1 Device by Does not apply route as directed.    losartan (COZAAR) 100 MG tablet Take 1 tablet (100 mg total) by mouth daily.    metFORMIN (GLUCOPHAGE-XR) 500 MG 24 hr tablet Take 2 tablets (1,000 mg total) by mouth 2 (two) times daily.    methocarbamol (ROBAXIN) 750 MG tablet TAKE 1 TABLET (750 MG TOTAL) BY MOUTH EVERY 12 (TWELVE) HOURS AS NEEDED FOR MUSCLE SPASMS.    metoprolol succinate (TOPROL-XL) 100 MG 24 hr tablet TAKE 1 TABLET BY MOUTH EVERY DAY    montelukast (SINGULAIR) 10 MG tablet TAKE 1 TABLET BY MOUTH EVERY DAY AS NEEDED FOR ALLERGIES    nitroGLYCERIN (NITROSTAT) 0.4 MG SL tablet Place 1 tablet (0.4 mg total) under the tongue every 5 (five) minutes as needed for chest pain. 10/19/2019: PRN   Polyvinyl Alcohol-Povidone (REFRESH OP) Place 2 drops into both eyes daily as needed (for dry eyes).    rosuvastatin (CRESTOR) 20 MG tablet Take 1 tablet (20 mg total) by mouth daily.    Tiotropium Bromide Monohydrate (SPIRIVA RESPIMAT) 2.5 MCG/ACT AERS Inhale  2 puffs into the lungs daily.    traZODone (DESYREL) 50 MG tablet TAKE 1/2 TO 1 TABLET BY MOUTH AT BEDTIME AS NEEDED FOR SLEEP    triamcinolone cream (KENALOG) 0.1 % Apply 1 application topically 2 (two) times daily. Use once or twice a day for poison ivy- apply sparingly to face.  Use no longer than 1 week    No facility-administered encounter medications on file as of 05/10/2020.    Current Diagnosis: Patient Active Problem List   Diagnosis Date Noted   Uncontrolled type 2 diabetes mellitus with complication, with long-term current use of insulin ([place]) 03/14/2020   Type 2 diabetes mellitus with hyperglycemia, with long-term current use of insulin ([place]) 12/10/2019   Type 2 diabetes  mellitus with diabetic polyneuropathy, with long-term current use of insulin ([place]) 12/10/2019   Obesity (BMI 30-39.9) 08/24/2018   Depression with anxiety 08/24/2018   CAD (coronary artery disease) 08/24/2018   Abnormal cardiovascular stress test 08/24/2018   Lobar pneumonia, unspecified organism ([place]) 11/13/2017   Precordial chest pain    Sepsis ([place]) 09/18/2017   Chronic diastolic CHF (congestive heart failure) ([place]) 09/18/2017   Diabetes mellitus ([place]) 09/08/2017   Left hip pain 10/28/2016   Chronic rhinitis 08/07/2016   COPD with acute exacerbation ([place]) 04/25/2016   Essential hypertension 01/30/2016   Hypokalemia 01/27/2016   HLD (hyperlipidemia)    Type 2 diabetes mellitus without complication, without long-term current use of insulin (HCC)    OSA (obstructive sleep apnea) 05/06/2014   Dyspnea on exertion 09/02/2012   Chest pain 12/10/2011   Chronic cough 05/11/2010   GERD 06/28/2008    Goals Addressed   None    Called and discussed with patient that she will received two different applications for PAP for Spiriva and Trulicity in the mail. Advised patient to please complete all the patient information to not delay process. Advised her that if she had any questions please contact me.  Advised patient she can take to office herself or mail it back to [facility].  05-12-20 PAP placed in out coming mail.  Follow-Up:  Pharmacist Review   [name], [place] [facility] at [place] Pharmacist Assistant [phone]  Reviewed by: De Blanch, PharmD Clinical Pharmacist [place] [facility] [phone]

## 2020-06-07 ENCOUNTER — Telehealth: Payer: Self-pay | Admitting: Pharmacist

## 2020-06-07 NOTE — Progress Notes (Signed)
Chronic Care Management Pharmacy Assistant   Name: Destiny Ballard  MRN: [id number] DOB: 07/13/1951  Reason for Encounter: Medication Review/ Patient Assistance Coordination  Patient Questions:  1.  Have you seen any other providers since your last visit? No  2.  Any changes in your medicines or health? No  PCP : Copland, Gay Filler, MD  Allergies:   Allergies  Allergen Reactions  . Prednisone Other (See Comments)    REACTION: mood swings, nightmares. "Shot doesn't bother me, reaction is just with the pill" she states she has had the steroid injections before. From our records methylprednisone was given to her in 2013 without any complications.    Medications: Outpatient Encounter Medications as of 06/07/2020  Medication Sig Note  . albuterol (VENTOLIN HFA) 108 (90 Base) MCG/ACT inhaler INHALE 2 PUFFS INTO THE LUNGS EVERY 8 (EIGHT) HOURS AS NEEDED FOR WHEEZING OR SHORTNESS OF BREATH   . aspirin EC 81 MG tablet Take 81 mg by mouth daily.   . calcium carbonate (TUMS - DOSED IN MG ELEMENTAL CALCIUM) 500 MG chewable tablet Chew 2 tablets by mouth daily as needed for indigestion or heartburn.   . cetirizine (ZYRTEC) 10 MG tablet TAKE 1 TABLET BY MOUTH DAILY AS NEEDED FOR ALLERGIES   . Cholecalciferol (VITAMIN D3) 1000 UNITS CAPS Take 1,000 Units by mouth daily.    . dapagliflozin propanediol (FARXIGA) 10 MG TABS tablet Take 1 tablet (10 mg total) by mouth daily.   . Dulaglutide (TRULICITY) 3.47 QQ/5.9DG SOPN Inject 0.5 mLs (0.75 mg total) into the skin once a week.   [name] FLUoxetine (PROZAC) 40 MG capsule TAKE 1 CAPSULE BY MOUTH EVERY DAY   . fluticasone (FLONASE) 50 MCG/ACT nasal spray SPRAY 2 SPRAYS INTO EACH NOSTRIL EVERY DAY   . furosemide (LASIX) 20 MG tablet TAKE 1 TABLET BY MOUTH EVERY DAY   . gabapentin (NEURONTIN) 300 MG capsule TAKE 2 CAPSULES BY MOUTH TWICE A DAY   . hydrALAZINE (APRESOLINE) 25 MG tablet Patient needs to schedule appointment for any future refills.  Please  call [phone].  1st attempt.   . hydrOXYzine (ATARAX/VISTARIL) 25 MG tablet TAKE 1/2 TO 1 TABLET BY MOUTH EVERY 8 HOURS AS NEEDED FOR ITCHING   . Insulin Glargine (BASAGLAR KWIKPEN) 100 UNIT/ML Inject 0.24 mLs (24 Units total) into the skin daily.   . Insulin Pen Needle 32G X 4 MM MISC 1 Device by Does not apply route as directed.   [name] losartan (COZAAR) 100 MG tablet Take 1 tablet (100 mg total) by mouth daily.   . metFORMIN (GLUCOPHAGE-XR) 500 MG 24 hr tablet Take 2 tablets (1,000 mg total) by mouth 2 (two) times daily.   . methocarbamol (ROBAXIN) 750 MG tablet TAKE 1 TABLET (750 MG TOTAL) BY MOUTH EVERY 12 (TWELVE) HOURS AS NEEDED FOR MUSCLE SPASMS.   . metoprolol succinate (TOPROL-XL) 100 MG 24 hr tablet TAKE 1 TABLET BY MOUTH EVERY DAY   . montelukast (SINGULAIR) 10 MG tablet TAKE 1 TABLET BY MOUTH EVERY DAY AS NEEDED FOR ALLERGIES   . nitroGLYCERIN (NITROSTAT) 0.4 MG SL tablet Place 1 tablet (0.4 mg total) under the tongue every 5 (five) minutes as needed for chest pain. 10/19/2019: PRN  . Polyvinyl Alcohol-Povidone (REFRESH OP) Place 2 drops into both eyes daily as needed (for dry eyes).   . rosuvastatin (CRESTOR) 20 MG tablet Take 1 tablet (20 mg total) by mouth daily.   . Tiotropium Bromide Monohydrate (SPIRIVA RESPIMAT) 2.5 MCG/ACT AERS Inhale 2 puffs  into the lungs daily.   . traZODone (DESYREL) 50 MG tablet TAKE 1/2 TO 1 TABLET BY MOUTH AT BEDTIME AS NEEDED FOR SLEEP   . triamcinolone cream (KENALOG) 0.1 % Apply 1 application topically 2 (two) times daily. Use once or twice a day for poison ivy- apply sparingly to face.  Use no longer than 1 week    No facility-administered encounter medications on file as of 06/07/2020.    Current Diagnosis: Patient Active Problem List   Diagnosis Date Noted  . Uncontrolled type 2 diabetes mellitus with complication, with long-term current use of insulin ([place]) 03/14/2020  . Type 2 diabetes mellitus with hyperglycemia, with long-term current use of  insulin ([place]) 12/10/2019  . Type 2 diabetes mellitus with diabetic polyneuropathy, with long-term current use of insulin ([place]) 12/10/2019  . Obesity (BMI 30-39.9) 08/24/2018  . Depression with anxiety 08/24/2018  . CAD (coronary artery disease) 08/24/2018  . Abnormal cardiovascular stress test 08/24/2018  . Lobar pneumonia, unspecified organism ([place]) 11/13/2017  . Precordial chest pain   . Sepsis ([place]) 09/18/2017  . Chronic diastolic CHF (congestive heart failure) ([place]) 09/18/2017  . Diabetes mellitus ([place]) 09/08/2017  . Left hip pain 10/28/2016  . Chronic rhinitis 08/07/2016  . COPD with acute exacerbation ([place]) 04/25/2016  . Essential hypertension 01/30/2016  . Hypokalemia 01/27/2016  . HLD (hyperlipidemia)   . Type 2 diabetes mellitus without complication, without long-term current use of insulin ([place])   . OSA (obstructive sleep apnea) 05/06/2014  . Dyspnea on exertion 09/02/2012  . Chest pain 12/10/2011  . Chronic cough 05/11/2010  . GERD 06/28/2008    Goals Addressed   None     Chronic Care Management   Outreach Note  06/13/2020 Name: Destiny Ballard MRN: [id number] DOB: July 15, 1951  Referred by: Darreld Mclean, MD Reason for referral : Chronic Care Management   Third unsuccessful telephone outreach was attempted today. The patient was referred to the pharmacist for assistance with care management and care coordination.    Follow-Up:  Pharmacist Review   Destiny Ballard, [facility] [place] Pharmacist Assistant [phone]

## 2020-06-10 ENCOUNTER — Other Ambulatory Visit: Payer: Self-pay | Admitting: Family Medicine

## 2020-06-10 DIAGNOSIS — E785 Hyperlipidemia, unspecified: Secondary | ICD-10-CM

## 2020-06-11 NOTE — Patient Instructions (Signed)
It was good to see you again today!   

## 2020-06-11 NOTE — Progress Notes (Signed)
[place] at [facility] [address] [place], [place] [address] [phone]  Date:  06/19/2020   Name:  Destiny Ballard   DOB:  31-Mar-1951   MRN:  [id number]  PCP:  Darreld Mclean, MD    Chief Complaint: Cough (congestion, cough, one week, no fever) and History of Bronchitis   History of Present Illness:  Destiny Ballard is a 69 y.o. very pleasant female patient who presents with the following:  Patient here today for virtual sick visit - phone visit only as pt could not get her video to work Connected with patient on telephone today, the patient myself are present on the call.  Patient location is home, provider location is office She notes a cough, congestion in her lungs- sx for one week She has not noted any fever No body aches or chills She does not check her temperature  She is feeling more SOB  She is using spiriva and also albuterol prn   History of difficult to control diabetes, obesity, CAD, depression and anxiety, COPD, hypertension, hyperlipidemia Last seen by myself in April of this year She is now also seeing endocrinology for help with her diabetes care-last visit with Dr. Alfonso Patten for was in August  Flu shot- not done this fall  COVID-19 vaccine- not done Eye exam Foot exam  At most recent check in August her A1c was 8.2, still high but improved  Albuterol as needed Spiriva Farxiga Trulicity Basaglar Metformin Metoprolol Losartan  Lasix Gabapentin Crestor Trazodone Prozac 40 Patient Active Problem List   Diagnosis Date Noted  . Uncontrolled type 2 diabetes mellitus with complication, with long-term current use of insulin ([place]) 03/14/2020  . Type 2 diabetes mellitus with hyperglycemia, with long-term current use of insulin ([place]) 12/10/2019  . Type 2 diabetes mellitus with diabetic polyneuropathy, with long-term current use of insulin ([place]) 12/10/2019  . Obesity (BMI 30-39.9) 08/24/2018  .  Depression with anxiety 08/24/2018  . CAD (coronary artery disease) 08/24/2018  . Abnormal cardiovascular stress test 08/24/2018  . Lobar pneumonia, unspecified organism ([place]) 11/13/2017  . Precordial chest pain   . Sepsis ([place]) 09/18/2017  . Chronic diastolic CHF (congestive heart failure) ([place]) 09/18/2017  . Diabetes mellitus ([place]) 09/08/2017  . Left hip pain 10/28/2016  . Chronic rhinitis 08/07/2016  . COPD with acute exacerbation ([employer]) 04/25/2016  . Essential hypertension 01/30/2016  . Hypokalemia 01/27/2016  . HLD (hyperlipidemia)   . Type 2 diabetes mellitus without complication, without long-term current use of insulin ([place])   . OSA (obstructive sleep apnea) 05/06/2014  . Dyspnea on exertion 09/02/2012  . Chest pain 12/10/2011  . Chronic cough 05/11/2010  . GERD 06/28/2008    Past Medical History:  Diagnosis Date  . Anxiety    Prior suicide attempt  . CAD (coronary artery disease)    a) s/p DES to LAD 07/2005 b) Last Myoview low risk 11/2011 showing small fixed apical perfusion defect (prior MI vs attenuation) but no ischemia - normal EF.  [name] Cervical spondylosis   . Coronary atherosclerosis 06/28/2008  . Depression   . Depression with anxiety   . Diabetes mellitus without complication ([place])   . GERD (gastroesophageal reflux disease)   . Hyperlipidemia   . Hypertension   . Insulin resistance   . Iron deficiency anemia   . Obesity     Past Surgical History:  Procedure Laterality Date  . BREAST ENHANCEMENT SURGERY    .  CARDIAC CATHETERIZATION  06/17/2007   NORMAL. EF 60%  . CARDIAC CATHETERIZATION N/A 01/29/2016   Procedure: Left Heart Cath and Coronary Angiography;  Surgeon: Sherren Mocha, MD;  Location: [facility];  Service: Cardiovascular;  Laterality: N/A;  . CERVICAL SPONDYLOSIS     SINGLE LEVEL FUSION  . CHILDBIRTH     X3  . CORONARY STENT PLACEMENT  07/2005   LEFT ANTERIOR DESCENDING  . FOREARM FRACTURE SURGERY  2010   hand and shoulder   .  INCISION AND DRAINAGE BREAST ABSCESS  01/05/2012      . INCISION AND DRAINAGE PERIRECTAL ABSCESS N/A 02/18/2014   Procedure: IRRIGATION AND DEBRIDEMENT PERIRECTAL ABSCESS;  Surgeon: Pedro Earls, MD;  Location: [facility];  Service: General;  Laterality: N/A;  . LEFT HEART CATH AND CORONARY ANGIOGRAPHY N/A 10/10/2017   Procedure: LEFT HEART CATH AND CORONARY ANGIOGRAPHY;  Surgeon: Burnell Blanks, MD;  Location: [facility];  Service: Cardiovascular;  Laterality: N/A;  . LUMBAR LAMINECTOMY    . ROTATOR CUFF REPAIR     bil[place]la  . TONSILLECTOMY AND ADENOIDECTOMY    . TUBAL LIGATION    . VIDEO BRONCHOSCOPY Bilateral 08/13/2016   Procedure: VIDEO BRONCHOSCOPY WITHOUT FLUORO;  Surgeon: Collene Gobble, MD;  Location: [facility];  Service: Cardiopulmonary;  Laterality: Bilateral;    Social History   Tobacco Use  . Smoking status: Passive Smoke Exposure - Never Smoker  . Smokeless tobacco: Never Used  Vaping Use  . Vaping Use: Never used  Substance Use Topics  . Alcohol use: Yes    Comment: occ  . Drug use: No    Family History  Problem Relation Age of Onset  . Heart attack Mother   . Diabetes Mother   . Lung cancer Mother   . Asthma Mother   . Heart disease Mother   . Suicidality Father        "killed himself"  . Asthma Daughter        x 2  . Cancer Daughter        pre-cancerous polyp  . Diabetes Sister   . Cancer Sister   . Cervical cancer Daughter        cervical   . Allergies Other        all family--seasonal allergies    Allergies  Allergen Reactions  . Prednisone Other (See Comments)    REACTION: mood swings, nightmares. "Shot doesn't bother me, reaction is just with the pill" she states she has had the steroid injections before. From our records methylprednisone was given to her in 2013 without any complications.    Medication list has been reviewed and updated.  Current Outpatient Medications on File Prior to Visit  Medication Sig Dispense  Refill  . albuterol (VENTOLIN HFA) 108 (90 Base) MCG/ACT inhaler INHALE 2 PUFFS INTO THE LUNGS EVERY 8 (EIGHT) HOURS AS NEEDED FOR WHEEZING OR SHORTNESS OF BREATH 18 each 2  . aspirin EC 81 MG tablet Take 81 mg by mouth daily.    . benzonatate (TESSALON) 200 MG capsule Take 200 mg by mouth 3 (three) times daily as needed.    . calcium carbonate (TUMS - DOSED IN MG ELEMENTAL CALCIUM) 500 MG chewable tablet Chew 2 tablets by mouth daily as needed for indigestion or heartburn.    . cetirizine (ZYRTEC) 10 MG tablet TAKE 1 TABLET BY MOUTH DAILY AS NEEDED FOR ALLERGIES 90 tablet 1  . Cholecalciferol (VITAMIN D3) 1000 UNITS CAPS Take 1,000 Units by  mouth daily.     . dapagliflozin propanediol (FARXIGA) 10 MG TABS tablet Take 1 tablet (10 mg total) by mouth daily. 90 tablet 3  . Dulaglutide (TRULICITY) 0.73 XT/0.6YI SOPN Inject 0.5 mLs (0.75 mg total) into the skin once a week. 2 mL 6  . FLUoxetine (PROZAC) 40 MG capsule TAKE 1 CAPSULE BY MOUTH EVERY DAY 90 capsule 3  . fluticasone (FLONASE) 50 MCG/ACT nasal spray SPRAY 2 SPRAYS INTO EACH NOSTRIL EVERY DAY 48 g 0  . furosemide (LASIX) 20 MG tablet TAKE 1 TABLET BY MOUTH EVERY DAY 90 tablet 2  . gabapentin (NEURONTIN) 300 MG capsule TAKE 2 CAPSULES BY MOUTH TWICE A DAY 360 capsule 1  . hydrALAZINE (APRESOLINE) 25 MG tablet Patient needs to schedule appointment for any future refills.  Please call [phone].  1st attempt. 336 tablet 0  . hydrOXYzine (ATARAX/VISTARIL) 25 MG tablet TAKE 1/2 TO 1 TABLET BY MOUTH EVERY 8 HOURS AS NEEDED FOR ITCHING 270 tablet 3  . Insulin Glargine (BASAGLAR KWIKPEN) 100 UNIT/ML Inject 0.24 mLs (24 Units total) into the skin daily. 15 mL 11  . Insulin Pen Needle 32G X 4 MM MISC 1 Device by Does not apply route as directed. 100 each 11  . losartan (COZAAR) 100 MG tablet Take 1 tablet (100 mg total) by mouth daily. 90 tablet 3  . metFORMIN (GLUCOPHAGE-XR) 500 MG 24 hr tablet Take 2 tablets (1,000 mg total) by mouth 2 (two) times  daily. 360 tablet 3  . methocarbamol (ROBAXIN) 750 MG tablet TAKE 1 TABLET (750 MG TOTAL) BY MOUTH EVERY 12 (TWELVE) HOURS AS NEEDED FOR MUSCLE SPASMS. 60 tablet 3  . metoprolol succinate (TOPROL-XL) 100 MG 24 hr tablet TAKE 1 TABLET BY MOUTH EVERY DAY 90 tablet 3  . montelukast (SINGULAIR) 10 MG tablet TAKE 1 TABLET BY MOUTH EVERY DAY AS NEEDED FOR ALLERGIES 90 tablet 1  . nitroGLYCERIN (NITROSTAT) 0.4 MG SL tablet Place 1 tablet (0.4 mg total) under the tongue every 5 (five) minutes as needed for chest pain. 20 tablet 3  . Polyvinyl Alcohol-Povidone (REFRESH OP) Place 2 drops into both eyes daily as needed (for dry eyes).    . rosuvastatin (CRESTOR) 20 MG tablet TAKE 1 TABLET BY MOUTH EVERY DAY 90 tablet 3  . Tiotropium Bromide Monohydrate (SPIRIVA RESPIMAT) 2.5 MCG/ACT AERS Inhale 2 puffs into the lungs daily. 4 g 3  . traZODone (DESYREL) 50 MG tablet TAKE 1/2 TO 1 TABLET BY MOUTH AT BEDTIME AS NEEDED FOR SLEEP 90 tablet 1  . triamcinolone cream (KENALOG) 0.1 % Apply 1 application topically 2 (two) times daily. Use once or twice a day for poison ivy- apply sparingly to face.  Use no longer than 1 week 30 g 0   No current facility-administered medications on file prior to visit.    Review of Systems:  As per HPI- otherwise negative.   Physical Examination: Vitals:   There were no vitals filed for this visit. There is no height or weight on file to calculate BMI. Ideal Body Weight:    Spoke with patient over telephone.  She has occasional cough, otherwise sounds well.  She is not checking vital signs at home  Assessment and Plan: Chest congestion - Plan: doxycycline (VIBRA-TABS) 100 MG tablet  Cough - Plan: benzonatate (TESSALON) 200 MG capsule  Virtual visit today for chest congestion, likely COPD exacerbation. Patient has history of poor response to oral steroids, she is able to use an injection.  However, we need  to rule out COVID-19 prior to bringing him in the office for  injection.  We will start her on doxycycline and Tessalon Perles now.  Asked her to get tested for COVID-19 her pharmacy today if possible.  Assuming she is negative we are going to bring her in later this week for an in person visit and steroids as needed She will let me know when her COVID-19 test shows and keep me up-to-date on her progress Spoke with pt for 5 minutes today  Signed Lamar Blinks, MD

## 2020-06-13 ENCOUNTER — Ambulatory Visit: Payer: Medicare Other | Admitting: Internal Medicine

## 2020-06-14 ENCOUNTER — Ambulatory Visit: Payer: Medicare Other | Admitting: Internal Medicine

## 2020-06-19 ENCOUNTER — Telehealth (INDEPENDENT_AMBULATORY_CARE_PROVIDER_SITE_OTHER): Payer: Medicare Other | Admitting: Family Medicine

## 2020-06-19 ENCOUNTER — Other Ambulatory Visit: Payer: Medicare Other

## 2020-06-19 ENCOUNTER — Other Ambulatory Visit: Payer: Self-pay

## 2020-06-19 ENCOUNTER — Encounter: Payer: Self-pay | Admitting: Family Medicine

## 2020-06-19 DIAGNOSIS — Z20822 Contact with and (suspected) exposure to covid-19: Secondary | ICD-10-CM

## 2020-06-19 DIAGNOSIS — R0989 Other specified symptoms and signs involving the circulatory and respiratory systems: Secondary | ICD-10-CM

## 2020-06-19 DIAGNOSIS — R059 Cough, unspecified: Secondary | ICD-10-CM | POA: Diagnosis not present

## 2020-06-19 MED ORDER — DOXYCYCLINE HYCLATE 100 MG PO TABS: 100.0000 mg | ORAL_TABLET | Freq: Two times a day (BID) | ORAL | 0 refills | Status: DC

## 2020-06-19 MED ORDER — BENZONATATE 200 MG PO CAPS: 200.0000 mg | ORAL_CAPSULE | Freq: Three times a day (TID) | ORAL | 0 refills | Status: DC | PRN

## 2020-06-21 LAB — NOVEL CORONAVIRUS, NAA: SARS-CoV-2, NAA: NOT DETECTED

## 2020-06-21 LAB — SARS-COV-2, NAA 2 DAY TAT

## 2020-06-23 ENCOUNTER — Other Ambulatory Visit: Payer: Self-pay | Admitting: *Deleted

## 2020-06-23 NOTE — Patient Outreach (Signed)
[facility]) Care Management  06/23/2020  Destiny Ballard 15-May-1951 [id number]   [place] other MonthOutreach  Referral Date:12/25/2017 Referral Source:Transfer from [place] Reason for Referral:Continued Disease Management Education Insurance:Medicare   Outreach Attempt:  Outreach attempt #1 to patient for follow up. No answer. RN Health Coach left voicemail message along with contact information.  Plan:  RN Health Coach will make another outreach attempt within the month of January if no return call back from patient.   [place] [phone] [email or web address]

## 2020-06-27 ENCOUNTER — Ambulatory Visit: Payer: Medicare Other | Admitting: Internal Medicine

## 2020-07-31 ENCOUNTER — Ambulatory Visit: Payer: Self-pay | Admitting: *Deleted

## 2020-08-01 ENCOUNTER — Other Ambulatory Visit: Payer: Self-pay | Admitting: *Deleted

## 2020-08-01 NOTE — Patient Instructions (Signed)
Goals Addressed            This Visit's Progress   . [facility]) Eat Healthy   Not on track    Timeframe:  Long-Range Goal Priority:  Medium Start Date:  29798921                           Expected End Date:     [id number]                 Follow Up Date 14481856   - fill half of plate with vegetables - manage portion size - prepare main meal at home 3 to 5 days each week - read food labels for fat, fiber, carbohydrates and portion size - set a realistic goal -decrease snacking on chips and choose healthier snack options like celery or carrots    Why is this important?   When you are ready to manage your nutrition or weight, having a plan and setting goals will help.  Taking small steps to change how you eat and exercise is a good place to start.    Notes:  Provided Planning healthy meals education Eating Plan for COPD     . [facility]) Learn More About My Health   On track    Timeframe:  Long-Range Goal Priority:  Medium Start Date:  31497026                           Expected End Date:  37858850                    Follow Up Date [id number]   - tell my story and reason for my visit - make a list of questions - ask questions - repeat what I heard to make sure I understand - bring a list of my medicines to the visit - speak up when I don't understand    Why is this important?   The best way to learn about your health and care is by talking to the doctor and nurse.  They will answer your questions and give you information in the way that you like best.    Notes:  Provided education on COPD exacerbation Provided educational material on COPD and physical activity    . [facility]) Make and Keep All Appointments   On track    Timeframe:  Long-Range Goal Priority:  Medium Start Date:   86767209                          Expected End Date:   47096283                   Follow Up Date 66294765   - ask family or friend for a ride - call to cancel if needed - keep a calendar with appointment  dates -schedule follow up appointment with PCP -discuss with provider Diabetes and Nutrition referral for education    Why is this important?   Part of staying healthy is seeing the doctor for follow-up care.  If you forget your appointments, there are some things you can do to stay on track.    Notes:     . [facility]) Monitor and Manage My Blood Sugar   On track    Timeframe:  Long-Range Goal Priority:  High Start Date:    46503546  Expected End Date:    19379024                  Follow Up Date 09735329   - check blood sugar at prescribed times - enter blood sugar readings and medication or insulin into daily log - take the blood sugar log to all doctor visits - take the blood sugar meter to all doctor visits -notify provider for sustained elevations -work with [facility] for medication assistance for Trulicity and Spiriva    Why is this important?   Checking your blood sugar at home helps to keep it from getting very high or very low.  Writing the results in a diary or log helps the doctor know how to care for you.  Your blood sugar log should have the time, date and the results.  Also, write down the amount of insulin or other medicine that you take.  Other information, like what you ate, exercise done and how you were feeling, will also be helpful.     Notes:     . [facility]) Set My Target A1C   On track    Timeframe:  Long-Range Goal Priority:  High Start Date:   92426834                          Expected End Date:     19622297                 Follow Up Date 98921194   - set target A1C; Target is 6, current is 8.2    Why is this important?   Your target A1C is decided together by you and your doctor.  It is based on several things like your age and other health issues.    Notes:     . [facility]) Track and Manage My Symptoms   On track    Timeframe:  Long-Range Goal Priority:  High Start Date:   17408144                          Expected End Date:  81856314                      Follow Up Date 97026378   - develop a rescue plan - eliminate symptom triggers at home - follow rescue plan if symptoms flare-up - keep follow-up appointments    Why is this important?   Tracking your symptoms and other information about your health helps your doctor plan your care.  Write down the symptoms, the time of day, what you were doing and what medicine you are taking.  You will soon learn how to manage your symptoms.     Notes:  Provided educational education on COPD exacerbation

## 2020-08-01 NOTE — Patient Outreach (Signed)
[facility]) Care Management  [facility] Care Manager  08/01/2020   Destiny Ballard 01/26/1951 [id number]  RN Health Coach telephone call to patient.  [place] compliance verified. Per patient she is doing fair. Patient stated that cold weather makes it harder to breathe. Per patient she has not taken the COVID vaccine and does not plan to right now. Per patient her breathing is being controlled by her inhalers. She does not have to use them every day. Patient does have a CPAP but has been unable to tolerate it. Her appetite is good. She is not on any specific diet. RN discussed small frequent feedings instead of large feedings. Patient has agreed to further outreach calls.   Encounter Medications:  Outpatient Encounter Medications as of 08/01/2020  Medication Sig Note  . albuterol (VENTOLIN HFA) 108 (90 Base) MCG/ACT inhaler INHALE 2 PUFFS INTO THE LUNGS EVERY 8 (EIGHT) HOURS AS NEEDED FOR WHEEZING OR SHORTNESS OF BREATH   . aspirin EC 81 MG tablet Take 81 mg by mouth daily.   . benzonatate (TESSALON) 200 MG capsule Take 200 mg by mouth 3 (three) times daily as needed.   . benzonatate (TESSALON) 200 MG capsule Take 1 capsule (200 mg total) by mouth 3 (three) times daily as needed for cough.   . calcium carbonate (TUMS - DOSED IN MG ELEMENTAL CALCIUM) 500 MG chewable tablet Chew 2 tablets by mouth daily as needed for indigestion or heartburn.   . cetirizine (ZYRTEC) 10 MG tablet TAKE 1 TABLET BY MOUTH DAILY AS NEEDED FOR ALLERGIES   . Cholecalciferol (VITAMIN D3) 1000 UNITS CAPS Take 1,000 Units by mouth daily.    . dapagliflozin propanediol (FARXIGA) 10 MG TABS tablet Take 1 tablet (10 mg total) by mouth daily.   [name] doxycycline (VIBRA-TABS) 100 MG tablet Take 1 tablet (100 mg total) by mouth 2 (two) times daily.   . Dulaglutide (TRULICITY) 0.75 MG/0.5ML SOPN Inject 0.5 mLs (0.75 mg total) into the skin once a week.   [name] FLUoxetine (PROZAC) 40 MG capsule TAKE 1 CAPSULE BY MOUTH EVERY  DAY   . fluticasone (FLONASE) 50 MCG/ACT nasal spray SPRAY 2 SPRAYS INTO EACH NOSTRIL EVERY DAY   . furosemide (LASIX) 20 MG tablet TAKE 1 TABLET BY MOUTH EVERY DAY   . gabapentin (NEURONTIN) 300 MG capsule TAKE 2 CAPSULES BY MOUTH TWICE A DAY   . hydrALAZINE (APRESOLINE) 25 MG tablet Patient needs to schedule appointment for any future refills.  Please call [phone].  1st attempt.   . hydrOXYzine (ATARAX/VISTARIL) 25 MG tablet TAKE 1/2 TO 1 TABLET BY MOUTH EVERY 8 HOURS AS NEEDED FOR ITCHING   . Insulin Glargine (BASAGLAR KWIKPEN) 100 UNIT/ML Inject 0.24 mLs (24 Units total) into the skin daily.   . Insulin Pen Needle 32G X 4 MM MISC 1 Device by Does not apply route as directed.   [name] losartan (COZAAR) 100 MG tablet Take 1 tablet (100 mg total) by mouth daily.   . metFORMIN (GLUCOPHAGE-XR) 500 MG 24 hr tablet Take 2 tablets (1,000 mg total) by mouth 2 (two) times daily.   . methocarbamol (ROBAXIN) 750 MG tablet TAKE 1 TABLET (750 MG TOTAL) BY MOUTH EVERY 12 (TWELVE) HOURS AS NEEDED FOR MUSCLE SPASMS.   . metoprolol succinate (TOPROL-XL) 100 MG 24 hr tablet TAKE 1 TABLET BY MOUTH EVERY DAY   . montelukast (SINGULAIR) 10 MG tablet TAKE 1 TABLET BY MOUTH EVERY DAY AS NEEDED FOR ALLERGIES   . nitroGLYCERIN (NITROSTAT) 0.4 MG SL  tablet Place 1 tablet (0.4 mg total) under the tongue every 5 (five) minutes as needed for chest pain. 10/19/2019: PRN  . Polyvinyl Alcohol-Povidone (REFRESH OP) Place 2 drops into both eyes daily as needed (for dry eyes).   . rosuvastatin (CRESTOR) 20 MG tablet TAKE 1 TABLET BY MOUTH EVERY DAY   . Tiotropium Bromide Monohydrate (SPIRIVA RESPIMAT) 2.5 MCG/ACT AERS Inhale 2 puffs into the lungs daily.   . traZODone (DESYREL) 50 MG tablet TAKE 1/2 TO 1 TABLET BY MOUTH AT BEDTIME AS NEEDED FOR SLEEP   . triamcinolone cream (KENALOG) 0.1 % Apply 1 application topically 2 (two) times daily. Use once or twice a day for poison ivy- apply sparingly to face.  Use no longer than 1 week     No facility-administered encounter medications on file as of 08/01/2020.    Functional Status:  In your present state of health, do you have any difficulty performing the following activities: 01/06/2020  Hearing? Y  Comment trouble hearing out of left ear  Vision? N  Difficulty concentrating or making decisions? N  Walking or climbing stairs? Y  Comment difficulties climbing stairs  Dressing or bathing? N  Doing errands, shopping? N  Preparing Food and eating ? N  Using the Toilet? N  In the past six months, have you accidently leaked urine? Y  Comment some urinary incontinence  Do you have problems with loss of bowel control? Y  Comment wears depends  Managing your Medications? N  Managing your Finances? N  Housekeeping or managing your Housekeeping? N  Some recent data might be hidden    Fall/Depression Screening: Fall Risk  08/01/2020 05/04/2020 01/06/2020  Falls in the past year? 1 1 1   Comment - no falls since April 2021-falling down stairs and hurting tail bone Fall in April 2021  Number falls in past yr: 1 1 1   Injury with Fall? 1 1 1   Comment - - -  Risk for fall due to : History of fall(s);Impaired balance/gait;Impaired mobility;Impaired vision;Medication side effect History of fall(s);Impaired balance/gait;Impaired mobility;Impaired vision;Medication side effect History of fall(s);Medication side effect;Impaired balance/gait;Impaired mobility;Impaired vision;Orthopedic patient  Follow up Falls evaluation completed;Falls prevention discussed Falls evaluation completed;Education provided;Falls prevention discussed Falls evaluation completed;Education provided;Falls prevention discussed   PHQ 2/9 Scores 01/06/2020 12/29/2018 02/03/2018 10/24/2017 09/01/2017 08/27/2016 09/20/2015  PHQ - 2 Score 6 2 2 6 6 3  0  PHQ- 9 Score 13 7 6 9 21 15  -  Exception Documentation - - - - - - Patient refusal    Assessment:  Goals Addressed            This Visit's Progress   . [facility]) Eat  Healthy   Not on track    Timeframe:  Long-Range Goal Priority:  Medium Start Date:  62229798                           Expected End Date:     92119417                 Follow Up Date [id number]   - fill half of plate with vegetables - manage portion size - prepare main meal at home 3 to 5 days each week - read food labels for fat, fiber, carbohydrates and portion size - set a realistic goal -decrease snacking on chips and choose healthier snack options like celery or carrots    Why is this important?   When you are ready to  manage your nutrition or weight, having a plan and setting goals will help.  Taking small steps to change how you eat and exercise is a good place to start.    Notes:  Provided Planning healthy meals education Eating Plan for COPD     . [facility]) Learn More About My Health   On track    Timeframe:  Long-Range Goal Priority:  Medium Start Date:  89381017                           Expected End Date:  51025852                    Follow Up Date 77824235   - tell my story and reason for my visit - make a list of questions - ask questions - repeat what I heard to make sure I understand - bring a list of my medicines to the visit - speak up when I don't understand    Why is this important?   The best way to learn about your health and care is by talking to the doctor and nurse.  They will answer your questions and give you information in the way that you like best.    Notes:  Provided education on COPD exacerbation Provided educational material on COPD and physical activity    . [facility]) Make and Keep All Appointments   On track    Timeframe:  Long-Range Goal Priority:  Medium Start Date:   36144315                          Expected End Date:   40086761                   Follow Up Date 95093267   - ask family or friend for a ride - call to cancel if needed - keep a calendar with appointment dates -schedule follow up appointment with PCP -discuss with  provider Diabetes and Nutrition referral for education    Why is this important?   Part of staying healthy is seeing the doctor for follow-up care.  If you forget your appointments, there are some things you can do to stay on track.    Notes:     . [facility]) Monitor and Manage My Blood Sugar   On track    Timeframe:  Long-Range Goal Priority:  High Start Date:    12458099                         Expected End Date:    83382505                  Follow Up Date 39767341   - check blood sugar at prescribed times - enter blood sugar readings and medication or insulin into daily log - take the blood sugar log to all doctor visits - take the blood sugar meter to all doctor visits -notify provider for sustained elevations -work with [facility] for medication assistance for Trulicity and Spiriva    Why is this important?   Checking your blood sugar at home helps to keep it from getting very high or very low.  Writing the results in a diary or log helps the doctor know how to care for you.  Your blood sugar log should have the time, date and the results.  Also, write down  the amount of insulin or other medicine that you take.  Other information, like what you ate, exercise done and how you were feeling, will also be helpful.     Notes:     . [facility]) Set My Target A1C   On track    Timeframe:  Long-Range Goal Priority:  High Start Date:   38250539                          Expected End Date:     76734193                 Follow Up Date 79024097   - set target A1C; Target is 6, current is 8.2    Why is this important?   Your target A1C is decided together by you and your doctor.  It is based on several things like your age and other health issues.    Notes:     . [facility]) Track and Manage My Symptoms   On track    Timeframe:  Long-Range Goal Priority:  High Start Date:   35329924                          Expected End Date: 26834196                      Follow Up Date 22297989   -  develop a rescue plan - eliminate symptom triggers at home - follow rescue plan if symptoms flare-up - keep follow-up appointments    Why is this important?   Tracking your symptoms and other information about your health helps your doctor plan your care.  Write down the symptoms, the time of day, what you were doing and what medicine you are taking.  You will soon learn how to manage your symptoms.     Notes:  Provided educational education on COPD exacerbation       Plan:  Follow-up:  Patient agrees to Care Plan and Follow-up. Provide a Calendar book for documentation Provided educational material on COPD and physical activity Provided educational material on COPD exacerbation Provided educational material on eating plan for COPD Provided educational material on Planning healthy meals RN sent update assessment to PCP RN will follow up outreach within the month of April  Xiara Knisley [place] Management [phone]

## 2020-08-11 ENCOUNTER — Other Ambulatory Visit: Payer: Medicare Other

## 2020-08-11 DIAGNOSIS — Z20822 Contact with and (suspected) exposure to covid-19: Secondary | ICD-10-CM | POA: Diagnosis not present

## 2020-08-13 LAB — NOVEL CORONAVIRUS, NAA: SARS-CoV-2, NAA: NOT DETECTED

## 2020-08-13 LAB — SARS-COV-2, NAA 2 DAY TAT

## 2020-08-15 ENCOUNTER — Telehealth: Payer: Self-pay | Admitting: Family Medicine

## 2020-08-15 ENCOUNTER — Other Ambulatory Visit: Payer: Self-pay | Admitting: Family Medicine

## 2020-08-15 DIAGNOSIS — E785 Hyperlipidemia, unspecified: Secondary | ICD-10-CM

## 2020-08-15 DIAGNOSIS — E1159 Type 2 diabetes mellitus with other circulatory complications: Secondary | ICD-10-CM

## 2020-08-15 MED ORDER — METFORMIN HCL ER 500 MG PO TB24: 1000.0000 mg | ORAL_TABLET | Freq: Two times a day (BID) | ORAL | 0 refills | Status: DC

## 2020-08-15 NOTE — Telephone Encounter (Signed)
Medication: metFORMIN (GLUCOPHAGE-XR) 500 MG 24 hr tablet [[id number]]    Has the patient contacted their pharmacy? No. (If no, request that the patient contact the pharmacy for the refill.) (If yes, when and what did the pharmacy advise?)  Preferred Pharmacy (with phone number or street name):  [id number] [address], [place]  [place], [place] [address]  Phone:  [phone] Fax:  [phone]  DEA #:  [id number]  [place] Reason: --    Agent: Please be advised that RX refills may take up to 3 business days. We ask that you follow-up with your pharmacy.

## 2020-08-15 NOTE — Telephone Encounter (Signed)
Patient's medication managed by endo.

## 2020-08-15 NOTE — Telephone Encounter (Signed)
Refill sent. Noted that patient is overdue for follow up appointment

## 2020-08-16 ENCOUNTER — Telehealth: Payer: Self-pay

## 2020-08-16 NOTE — Telephone Encounter (Cosign Needed)
  Chronic Care Management   Outreach Note  08/16/2020 Name: Destiny Ballard MRN: [id number] DOB: 1950/10/26  Referred by: Darreld Mclean, MD Reason for referral : Chronic Care Management (Initial RNCM call.)   An unsuccessful telephone outreach was attempted today. The patient was referred to the case management team for assistance with care management and care coordination.   Follow Up Plan: A HIPAA compliant phone message was left for the patient providing contact information and requesting a return call.  The care management team will reach out to the patient again over the next 7-10 days days.    Thea Silversmith, RN, MSN, [facility] Care Management Coordinator [facility] Care Management/[facility] Direct Contact: [phone]

## 2020-08-16 NOTE — Patient Instructions (Incomplete)
It was great to see you again today, I will be in touch your labs soon as possible

## 2020-08-16 NOTE — Progress Notes (Deleted)
[place] at [facility] [address] [place], [place] [address] [phone]  Date:  08/17/2020   Name:  Destiny Ballard   DOB:  1951/04/27   MRN:  [id number]  PCP:  Darreld Mclean, MD    Chief Complaint: No chief complaint on file.   History of Present Illness:  Destiny Ballard is a 70 y.o. very pleasant female patient who presents with the following:  Patient is here today for a follow-up visit. Last seen by myself for virtual visit in December-I last saw her in person in April 2021  History of CAD, HTN, CHF, OSA, COPD, DM, GERD, HLD  She most recently saw Dr. Kelton Pillar to discuss her diabetes in August.  Instructions from that visit: MEDICATIONS: -Continue Metformin 500 mg , 2 tablet twice daily  - Continue  Farxiga 10 mg , 1 tablet daily in the morning  - Continue  Basaglar 24 units daily  - Start Trulicity [id number] mg weekly   COVID-19 series Eye exam Foot exam Flu vaccine No endocrinology visit scheduled, can offer A1c She is also not currently seeing cardiology  CMP on chart from September  Mammogram due DEXA scan also due Colonoscopy up-to-date Patient Active Problem List   Diagnosis Date Noted  . Uncontrolled type 2 diabetes mellitus with complication, with long-term current use of insulin ([place]) 03/14/2020  . Type 2 diabetes mellitus with hyperglycemia, with long-term current use of insulin ([place]) 12/10/2019  . Type 2 diabetes mellitus with diabetic polyneuropathy, with long-term current use of insulin ([place]) 12/10/2019  . Obesity (BMI 30-39.9) 08/24/2018  . Depression with anxiety 08/24/2018  . CAD (coronary artery disease) 08/24/2018  . Abnormal cardiovascular stress test 08/24/2018  . Lobar pneumonia, unspecified organism ([place]) 11/13/2017  . Precordial chest pain   . Sepsis ([place]) 09/18/2017  . Chronic diastolic CHF (congestive heart failure) ([place]) 09/18/2017  . Diabetes mellitus ([place]) 09/08/2017  .  Left hip pain 10/28/2016  . Chronic rhinitis 08/07/2016  . COPD with acute exacerbation ([place]) 04/25/2016  . Essential hypertension 01/30/2016  . Hypokalemia 01/27/2016  . HLD (hyperlipidemia)   . Type 2 diabetes mellitus without complication, without long-term current use of insulin ([place])   . OSA (obstructive sleep apnea) 05/06/2014  . Dyspnea on exertion 09/02/2012  . Chest pain 12/10/2011  . Chronic cough 05/11/2010  . GERD 06/28/2008    Past Medical History:  Diagnosis Date  . Anxiety    Prior suicide attempt  . CAD (coronary artery disease)    a) s/p DES to LAD 07/2005 b) Last Myoview low risk 11/2011 showing small fixed apical perfusion defect (prior MI vs attenuation) but no ischemia - normal EF.  [name] Cervical spondylosis   . Coronary atherosclerosis 06/28/2008  . Depression   . Depression with anxiety   . Diabetes mellitus without complication ([place])   . GERD (gastroesophageal reflux disease)   . Hyperlipidemia   . Hypertension   . Insulin resistance   . Iron deficiency anemia   . Obesity     Past Surgical History:  Procedure Laterality Date  . BREAST ENHANCEMENT SURGERY    . CARDIAC CATHETERIZATION  06/17/2007   NORMAL. EF 60%  . CARDIAC CATHETERIZATION N/A 01/29/2016   Procedure: Left Heart Cath and Coronary Angiography;  Surgeon: Sherren Mocha, MD;  Location: [facility];  Service: Cardiovascular;  Laterality: N/A;  . CERVICAL SPONDYLOSIS     SINGLE LEVEL FUSION  . CHILDBIRTH  X3  . CORONARY STENT PLACEMENT  07/2005   LEFT ANTERIOR DESCENDING  . FOREARM FRACTURE SURGERY  2010   hand and shoulder   . INCISION AND DRAINAGE BREAST ABSCESS  01/05/2012      . INCISION AND DRAINAGE PERIRECTAL ABSCESS N/A 02/18/2014   Procedure: IRRIGATION AND DEBRIDEMENT PERIRECTAL ABSCESS;  Surgeon: Pedro Earls, MD;  Location: [facility];  Service: General;  Laterality: N/A;  . LEFT HEART CATH AND CORONARY ANGIOGRAPHY N/A 10/10/2017   Procedure: LEFT HEART CATH AND CORONARY  ANGIOGRAPHY;  Surgeon: Burnell Blanks, MD;  Location: [facility];  Service: Cardiovascular;  Laterality: N/A;  . LUMBAR LAMINECTOMY    . ROTATOR CUFF REPAIR     [place]  . TONSILLECTOMY AND ADENOIDECTOMY    . TUBAL LIGATION    . VIDEO BRONCHOSCOPY Bilateral 08/13/2016   Procedure: VIDEO BRONCHOSCOPY WITHOUT FLUORO;  Surgeon: Collene Gobble, MD;  Location: [facility];  Service: Cardiopulmonary;  Laterality: Bilateral;    Social History   Tobacco Use  . Smoking status: Passive Smoke Exposure - Never Smoker  . Smokeless tobacco: Never Used  Vaping Use  . Vaping Use: Never used  Substance Use Topics  . Alcohol use: Yes    Comment: occ  . Drug use: No    Family History  Problem Relation Age of Onset  . Heart attack Mother   . Diabetes Mother   . Lung cancer Mother   . Asthma Mother   . Heart disease Mother   . Suicidality Father        "killed himself"  . Asthma Daughter        x 2  . Cancer Daughter        pre-cancerous polyp  . Diabetes Sister   . Cancer Sister   . Cervical cancer Daughter        cervical   . Allergies Other        all family--seasonal allergies    Allergies  Allergen Reactions  . Prednisone Other (See Comments)    REACTION: mood swings, nightmares. "Shot doesn't bother me, reaction is just with the pill" she states she has had the steroid injections before. From our records methylprednisone was given to her in 2013 without any complications.    Medication list has been reviewed and updated.  Current Outpatient Medications on File Prior to Visit  Medication Sig Dispense Refill  . albuterol (VENTOLIN HFA) 108 (90 Base) MCG/ACT inhaler INHALE 2 PUFFS INTO THE LUNGS EVERY 8 (EIGHT) HOURS AS NEEDED FOR WHEEZING OR SHORTNESS OF BREATH 18 each 2  . aspirin EC 81 MG tablet Take 81 mg by mouth daily.    . benzonatate (TESSALON) 200 MG capsule Take 200 mg by mouth 3 (three) times daily as needed.    . benzonatate (TESSALON) 200 MG  capsule Take 1 capsule (200 mg total) by mouth 3 (three) times daily as needed for cough. 40 capsule 0  . calcium carbonate (TUMS - DOSED IN MG ELEMENTAL CALCIUM) 500 MG chewable tablet Chew 2 tablets by mouth daily as needed for indigestion or heartburn.    . cetirizine (ZYRTEC) 10 MG tablet TAKE 1 TABLET BY MOUTH DAILY AS NEEDED FOR ALLERGIES 90 tablet 1  . Cholecalciferol (VITAMIN D3) 1000 UNITS CAPS Take 1,000 Units by mouth daily.     . dapagliflozin propanediol (FARXIGA) 10 MG TABS tablet Take 1 tablet (10 mg total) by mouth daily. 90 tablet 3  . doxycycline (VIBRA-TABS) 100 MG tablet  Take 1 tablet (100 mg total) by mouth 2 (two) times daily. 20 tablet 0  . Dulaglutide (TRULICITY) 3.53 IR/4.4RX SOPN Inject 0.5 mLs (0.75 mg total) into the skin once a week. 2 mL 6  . FLUoxetine (PROZAC) 40 MG capsule TAKE 1 CAPSULE BY MOUTH EVERY DAY 90 capsule 3  . fluticasone (FLONASE) 50 MCG/ACT nasal spray SPRAY 2 SPRAYS INTO EACH NOSTRIL EVERY DAY 48 g 0  . furosemide (LASIX) 20 MG tablet TAKE 1 TABLET BY MOUTH EVERY DAY 90 tablet 2  . gabapentin (NEURONTIN) 300 MG capsule TAKE 2 CAPSULES BY MOUTH TWICE A DAY 360 capsule 1  . hydrALAZINE (APRESOLINE) 25 MG tablet Patient needs to schedule appointment for any future refills.  Please call [phone].  1st attempt. 336 tablet 0  . hydrOXYzine (ATARAX/VISTARIL) 25 MG tablet TAKE 1/2 TO 1 TABLET BY MOUTH EVERY 8 HOURS AS NEEDED FOR ITCHING 270 tablet 3  . Insulin Glargine (BASAGLAR KWIKPEN) 100 UNIT/ML Inject 0.24 mLs (24 Units total) into the skin daily. 15 mL 11  . Insulin Pen Needle 32G X 4 MM MISC 1 Device by Does not apply route as directed. 100 each 11  . losartan (COZAAR) 100 MG tablet Take 1 tablet (100 mg total) by mouth daily. 90 tablet 3  . metFORMIN (GLUCOPHAGE-XR) 500 MG 24 hr tablet Take 2 tablets (1,000 mg total) by mouth 2 (two) times daily. 360 tablet 0  . methocarbamol (ROBAXIN) 750 MG tablet TAKE 1 TABLET (750 MG TOTAL) BY MOUTH EVERY 12  (TWELVE) HOURS AS NEEDED FOR MUSCLE SPASMS. 60 tablet 3  . metoprolol succinate (TOPROL-XL) 100 MG 24 hr tablet TAKE 1 TABLET BY MOUTH EVERY DAY 90 tablet 3  . montelukast (SINGULAIR) 10 MG tablet TAKE 1 TABLET BY MOUTH EVERY DAY AS NEEDED FOR ALLERGIES 90 tablet 1  . nitroGLYCERIN (NITROSTAT) 0.4 MG SL tablet Place 1 tablet (0.4 mg total) under the tongue every 5 (five) minutes as needed for chest pain. 20 tablet 3  . Polyvinyl Alcohol-Povidone (REFRESH OP) Place 2 drops into both eyes daily as needed (for dry eyes).    . rosuvastatin (CRESTOR) 20 MG tablet TAKE 1 TABLET BY MOUTH EVERY DAY 90 tablet 3  . Tiotropium Bromide Monohydrate (SPIRIVA RESPIMAT) 2.5 MCG/ACT AERS Inhale 2 puffs into the lungs daily. 4 g 3  . traZODone (DESYREL) 50 MG tablet TAKE 1/2 TO 1 TABLET BY MOUTH AT BEDTIME AS NEEDED FOR SLEEP 90 tablet 1  . triamcinolone cream (KENALOG) 0.1 % Apply 1 application topically 2 (two) times daily. Use once or twice a day for poison ivy- apply sparingly to face.  Use no longer than 1 week 30 g 0   No current facility-administered medications on file prior to visit.    Review of Systems:  As per HPI- otherwise negative.   Physical Examination: There were no vitals filed for this visit. There were no vitals filed for this visit. There is no height or weight on file to calculate BMI. Ideal Body Weight:    GEN: no acute distress. HEENT: Atraumatic, Normocephalic.  Ears and Nose: No external deformity. CV: RRR, No M/G/R. No JVD. No thrill. No extra heart sounds. PULM: CTA B, no wheezes, crackles, rhonchi. No retractions. No resp. distress. No accessory muscle use. ABD: S, NT, ND, +BS. No rebound. No HSM. EXTR: No c/c/e PSYCH: Normally interactive. Conversant.    Assessment and Plan: *** This visit occurred during the SARS-CoV-2 public health emergency.  Safety protocols were in place,  including screening questions prior to the visit, additional usage of staff PPE, and  extensive cleaning of exam room while observing appropriate contact time as indicated for disinfecting solutions.   Signed Lamar Blinks, MD

## 2020-08-17 ENCOUNTER — Telehealth: Payer: Self-pay

## 2020-08-17 ENCOUNTER — Ambulatory Visit: Payer: Medicare Other | Admitting: Family Medicine

## 2020-08-17 ENCOUNTER — Other Ambulatory Visit: Payer: Self-pay | Admitting: *Deleted

## 2020-08-17 DIAGNOSIS — R5383 Other fatigue: Secondary | ICD-10-CM

## 2020-08-17 DIAGNOSIS — G4733 Obstructive sleep apnea (adult) (pediatric): Secondary | ICD-10-CM

## 2020-08-17 DIAGNOSIS — E1165 Type 2 diabetes mellitus with hyperglycemia: Secondary | ICD-10-CM

## 2020-08-17 DIAGNOSIS — E785 Hyperlipidemia, unspecified: Secondary | ICD-10-CM

## 2020-08-17 DIAGNOSIS — I25118 Atherosclerotic heart disease of native coronary artery with other forms of angina pectoris: Secondary | ICD-10-CM

## 2020-08-17 DIAGNOSIS — F32A Depression, unspecified: Secondary | ICD-10-CM

## 2020-08-17 NOTE — Telephone Encounter (Signed)
Caller states she needs to reschedule her 940am appointment. She was possibly exposed to [place].  Telephone: [phone]

## 2020-08-17 NOTE — Telephone Encounter (Signed)
Rescheduled to 08/28/20

## 2020-08-21 ENCOUNTER — Inpatient Hospital Stay (HOSPITAL_BASED_OUTPATIENT_CLINIC_OR_DEPARTMENT_OTHER)
Admission: EM | Admit: 2020-08-21 | Discharge: 2020-08-23 | DRG: 177 | Disposition: A | Discharge status: Home / Self Care | Payer: Medicare Other | Attending: Internal Medicine | Admitting: Internal Medicine

## 2020-08-21 ENCOUNTER — Emergency Department (HOSPITAL_BASED_OUTPATIENT_CLINIC_OR_DEPARTMENT_OTHER): Payer: Medicare Other

## 2020-08-21 ENCOUNTER — Other Ambulatory Visit: Payer: Self-pay

## 2020-08-21 ENCOUNTER — Encounter (HOSPITAL_BASED_OUTPATIENT_CLINIC_OR_DEPARTMENT_OTHER): Payer: Self-pay | Admitting: *Deleted

## 2020-08-21 DIAGNOSIS — Z7982 Long term (current) use of aspirin: Secondary | ICD-10-CM | POA: Diagnosis not present

## 2020-08-21 DIAGNOSIS — Z888 Allergy status to other drugs, medicaments and biological substances status: Secondary | ICD-10-CM

## 2020-08-21 DIAGNOSIS — E785 Hyperlipidemia, unspecified: Secondary | ICD-10-CM | POA: Diagnosis present

## 2020-08-21 DIAGNOSIS — R0902 Hypoxemia: Secondary | ICD-10-CM

## 2020-08-21 DIAGNOSIS — M47812 Spondylosis without myelopathy or radiculopathy, cervical region: Secondary | ICD-10-CM | POA: Diagnosis not present

## 2020-08-21 DIAGNOSIS — Z9851 Tubal ligation status: Secondary | ICD-10-CM | POA: Diagnosis not present

## 2020-08-21 DIAGNOSIS — F418 Other specified anxiety disorders: Secondary | ICD-10-CM | POA: Diagnosis present

## 2020-08-21 DIAGNOSIS — E1169 Type 2 diabetes mellitus with other specified complication: Secondary | ICD-10-CM

## 2020-08-21 DIAGNOSIS — Z79899 Other long term (current) drug therapy: Secondary | ICD-10-CM | POA: Diagnosis not present

## 2020-08-21 DIAGNOSIS — Z9151 Personal history of suicidal behavior: Secondary | ICD-10-CM

## 2020-08-21 DIAGNOSIS — Z833 Family history of diabetes mellitus: Secondary | ICD-10-CM | POA: Diagnosis not present

## 2020-08-21 DIAGNOSIS — Z818 Family history of other mental and behavioral disorders: Secondary | ICD-10-CM | POA: Diagnosis not present

## 2020-08-21 DIAGNOSIS — E669 Obesity, unspecified: Secondary | ICD-10-CM | POA: Diagnosis not present

## 2020-08-21 DIAGNOSIS — G4733 Obstructive sleep apnea (adult) (pediatric): Secondary | ICD-10-CM | POA: Diagnosis not present

## 2020-08-21 DIAGNOSIS — U071 COVID-19: Principal | ICD-10-CM | POA: Diagnosis present

## 2020-08-21 DIAGNOSIS — J9601 Acute respiratory failure with hypoxia: Secondary | ICD-10-CM | POA: Diagnosis not present

## 2020-08-21 DIAGNOSIS — I2510 Atherosclerotic heart disease of native coronary artery without angina pectoris: Secondary | ICD-10-CM | POA: Diagnosis present

## 2020-08-21 DIAGNOSIS — K219 Gastro-esophageal reflux disease without esophagitis: Secondary | ICD-10-CM | POA: Diagnosis present

## 2020-08-21 DIAGNOSIS — I152 Hypertension secondary to endocrine disorders: Secondary | ICD-10-CM | POA: Diagnosis not present

## 2020-08-21 DIAGNOSIS — Z981 Arthrodesis status: Secondary | ICD-10-CM | POA: Diagnosis not present

## 2020-08-21 DIAGNOSIS — J1282 Pneumonia due to coronavirus disease 2019: Secondary | ICD-10-CM | POA: Diagnosis present

## 2020-08-21 DIAGNOSIS — J440 Chronic obstructive pulmonary disease with acute lower respiratory infection: Secondary | ICD-10-CM | POA: Diagnosis present

## 2020-08-21 DIAGNOSIS — Z955 Presence of coronary angioplasty implant and graft: Secondary | ICD-10-CM | POA: Diagnosis not present

## 2020-08-21 DIAGNOSIS — E1142 Type 2 diabetes mellitus with diabetic polyneuropathy: Secondary | ICD-10-CM | POA: Diagnosis not present

## 2020-08-21 DIAGNOSIS — R059 Cough, unspecified: Secondary | ICD-10-CM

## 2020-08-21 DIAGNOSIS — Z8249 Family history of ischemic heart disease and other diseases of the circulatory system: Secondary | ICD-10-CM | POA: Diagnosis not present

## 2020-08-21 DIAGNOSIS — Z794 Long term (current) use of insulin: Secondary | ICD-10-CM

## 2020-08-21 DIAGNOSIS — J80 Acute respiratory distress syndrome: Secondary | ICD-10-CM | POA: Diagnosis not present

## 2020-08-21 DIAGNOSIS — I5032 Chronic diastolic (congestive) heart failure: Secondary | ICD-10-CM | POA: Diagnosis present

## 2020-08-21 DIAGNOSIS — R0602 Shortness of breath: Secondary | ICD-10-CM | POA: Diagnosis not present

## 2020-08-21 DIAGNOSIS — E1165 Type 2 diabetes mellitus with hyperglycemia: Secondary | ICD-10-CM

## 2020-08-21 DIAGNOSIS — E1159 Type 2 diabetes mellitus with other circulatory complications: Secondary | ICD-10-CM | POA: Diagnosis present

## 2020-08-21 DIAGNOSIS — Z6836 Body mass index (BMI) 36.0-36.9, adult: Secondary | ICD-10-CM

## 2020-08-21 HISTORY — DX: COVID-19: U07.1

## 2020-08-21 HISTORY — DX: Type 2 diabetes mellitus with other specified complication: E11.69

## 2020-08-21 LAB — COMPREHENSIVE METABOLIC PANEL
ALT: 41 U/L (ref 0–44)
AST: 24 U/L (ref 15–41)
Albumin: 3.5 g/dL (ref 3.5–5.0)
Alkaline Phosphatase: 86 U/L (ref 38–126)
Anion gap: 11 (ref 5–15)
BUN: 14 mg/dL (ref 8–23)
CO2: 26 mmol/L (ref 22–32)
Calcium: 9.1 mg/dL (ref 8.9–10.3)
Chloride: 98 mmol/L (ref 98–111)
Creatinine, Ser: 0.69 mg/dL (ref 0.44–1.00)
GFR, Estimated: >60 mL/min (ref >60)
Glucose, Bld: 155 mg/dL — ABNORMAL HIGH (ref 70–99)
Potassium: 3.6 mmol/L (ref 3.5–5.1)
Sodium: 135 mmol/L (ref 135–145)
Total Bilirubin: 0.4 mg/dL (ref 0.3–1.2)
Total Protein: 7.5 g/dL (ref 6.5–8.1)

## 2020-08-21 LAB — CBC WITH DIFFERENTIAL/PLATELET
Abs Immature Granulocytes: 0.03 10*3/uL (ref 0.00–0.07)
Basophils Absolute: 0 10*3/uL (ref 0.0–0.1)
Basophils Relative: 0 %
Eosinophils Absolute: 0.2 10*3/uL (ref 0.0–0.5)
Eosinophils Relative: 2 %
HCT: 39.9 % (ref 36.0–46.0)
Hemoglobin: 12.6 g/dL (ref 12.0–15.0)
Immature Granulocytes: 0 %
Lymphocytes Relative: 20 %
Lymphs Abs: 1.7 10*3/uL (ref 0.7–4.0)
MCH: 24.9 pg — ABNORMAL LOW (ref 26.0–34.0)
MCHC: 31.6 g/dL (ref 30.0–36.0)
MCV: 78.9 fL — ABNORMAL LOW (ref 80.0–100.0)
Monocytes Absolute: 1.2 10*3/uL — ABNORMAL HIGH (ref 0.1–1.0)
Monocytes Relative: 14 %
Neutro Abs: 5.1 10*3/uL (ref 1.7–7.7)
Neutrophils Relative %: 64 %
Platelets: 209 10*3/uL (ref 150–400)
RBC: 5.06 MIL/uL (ref 3.87–5.11)
RDW: 14.4 % (ref 11.5–15.5)
WBC: 8.2 10*3/uL (ref 4.0–10.5)
nRBC: 0 % (ref 0.0–0.2)

## 2020-08-21 LAB — FIBRINOGEN: Fibrinogen: 505 mg/dL — ABNORMAL HIGH (ref 210–475)

## 2020-08-21 LAB — LACTATE DEHYDROGENASE: LDH: 103 U/L (ref 98–192)

## 2020-08-21 LAB — PROCALCITONIN: Procalcitonin: <0.1 ng/mL

## 2020-08-21 LAB — D-DIMER, QUANTITATIVE: D-Dimer, Quant: 0.38 ug/mL-FEU (ref 0.00–0.50)

## 2020-08-21 LAB — FERRITIN: Ferritin: 65 ng/mL (ref 11–307)

## 2020-08-21 LAB — LACTIC ACID, PLASMA
Lactic Acid, Venous: 1 mmol/L (ref 0.5–1.9)
Lactic Acid, Venous: 0.9 mmol/L (ref 0.5–1.9)

## 2020-08-21 LAB — SARS CORONAVIRUS 2 BY RT PCR (HOSPITAL ORDER, PERFORMED IN ~~LOC~~ HOSPITAL LAB): SARS Coronavirus 2: POSITIVE — AB

## 2020-08-21 LAB — GLUCOSE, CAPILLARY: Glucose-Capillary: 248 mg/dL — ABNORMAL HIGH (ref 70–99)

## 2020-08-21 LAB — TRIGLYCERIDES: Triglycerides: 366 mg/dL — ABNORMAL HIGH (ref <150)

## 2020-08-21 LAB — C-REACTIVE PROTEIN: CRP: 4.8 mg/dL — ABNORMAL HIGH (ref <1.0)

## 2020-08-21 MED ORDER — ONDANSETRON HCL 4 MG PO TABS: 4.0000 mg | ORAL_TABLET | Freq: Four times a day (QID) | ORAL | Status: DC | PRN

## 2020-08-21 MED ORDER — METHYLPREDNISOLONE SODIUM SUCC 40 MG IJ SOLR
40.0000 mg | Freq: Two times a day (BID) | INTRAMUSCULAR | Status: DC
  Administered 2020-08-22 – 2020-08-23 (×4): 40 mg via INTRAVENOUS
  Filled 2020-08-21 (×4): qty 1

## 2020-08-21 MED ORDER — ZINC SULFATE 220 (50 ZN) MG PO CAPS
220.0000 mg | ORAL_CAPSULE | Freq: Every day | ORAL | Status: DC
  Administered 2020-08-22 – 2020-08-23 (×2): 220 mg via ORAL
  Filled 2020-08-21 (×2): qty 1

## 2020-08-21 MED ORDER — DEXAMETHASONE SODIUM PHOSPHATE 10 MG/ML IJ SOLN
6.0000 mg | Freq: Once | INTRAMUSCULAR | Status: AC
  Administered 2020-08-21: 6 mg via INTRAVENOUS
  Filled 2020-08-21: qty 1

## 2020-08-21 MED ORDER — FUROSEMIDE 20 MG PO TABS
20.0000 mg | ORAL_TABLET | Freq: Every day | ORAL | Status: DC
  Administered 2020-08-22 – 2020-08-23 (×2): 20 mg via ORAL
  Filled 2020-08-21 (×2): qty 1

## 2020-08-21 MED ORDER — ENOXAPARIN SODIUM 40 MG/0.4ML ~~LOC~~ SOLN
40.0000 mg | Freq: Every day | SUBCUTANEOUS | Status: DC
  Administered 2020-08-22 (×2): 40 mg via SUBCUTANEOUS
  Filled 2020-08-21 (×2): qty 0.4

## 2020-08-21 MED ORDER — UMECLIDINIUM BROMIDE 62.5 MCG/INH IN AEPB
1.0000 | INHALATION_SPRAY | Freq: Every day | RESPIRATORY_TRACT | Status: DC
  Administered 2020-08-22 – 2020-08-23 (×2): 1 via RESPIRATORY_TRACT
  Filled 2020-08-21: qty 7

## 2020-08-21 MED ORDER — SODIUM CHLORIDE 0.9 % IV SOLN: INTRAVENOUS | Status: DC | PRN

## 2020-08-21 MED ORDER — FLUOXETINE HCL 20 MG PO CAPS
40.0000 mg | ORAL_CAPSULE | Freq: Every day | ORAL | Status: DC
  Administered 2020-08-22 – 2020-08-23 (×2): 40 mg via ORAL
  Filled 2020-08-21 (×2): qty 2

## 2020-08-21 MED ORDER — ROSUVASTATIN CALCIUM 20 MG PO TABS
20.0000 mg | ORAL_TABLET | Freq: Every day | ORAL | Status: DC
  Administered 2020-08-22 – 2020-08-23 (×2): 20 mg via ORAL
  Filled 2020-08-21 (×2): qty 1

## 2020-08-21 MED ORDER — ONDANSETRON HCL 4 MG/2ML IJ SOLN: 4.0000 mg | Freq: Four times a day (QID) | INTRAMUSCULAR | Status: DC | PRN

## 2020-08-21 MED ORDER — ALBUTEROL SULFATE HFA 108 (90 BASE) MCG/ACT IN AERS: 1.0000 | INHALATION_SPRAY | Freq: Four times a day (QID) | RESPIRATORY_TRACT | Status: DC | PRN

## 2020-08-21 MED ORDER — METOPROLOL SUCCINATE ER 100 MG PO TB24
100.0000 mg | ORAL_TABLET | Freq: Every day | ORAL | Status: DC
  Administered 2020-08-22 – 2020-08-23 (×2): 100 mg via ORAL
  Filled 2020-08-21 (×2): qty 1

## 2020-08-21 MED ORDER — GUAIFENESIN-DM 100-10 MG/5ML PO SYRP
10.0000 mL | ORAL_SOLUTION | ORAL | Status: DC | PRN
  Administered 2020-08-22 – 2020-08-23 (×5): 10 mL via ORAL
  Filled 2020-08-21 (×5): qty 10

## 2020-08-21 MED ORDER — HYDROCOD POLST-CPM POLST ER 10-8 MG/5ML PO SUER
5.0000 mL | Freq: Two times a day (BID) | ORAL | Status: DC | PRN
  Administered 2020-08-22 – 2020-08-23 (×3): 5 mL via ORAL
  Filled 2020-08-21 (×3): qty 5

## 2020-08-21 MED ORDER — ASCORBIC ACID 500 MG PO TABS
500.0000 mg | ORAL_TABLET | Freq: Every day | ORAL | Status: DC
  Administered 2020-08-22 – 2020-08-23 (×2): 500 mg via ORAL
  Filled 2020-08-21 (×2): qty 1

## 2020-08-21 MED ORDER — SODIUM CHLORIDE 0.9 % IV SOLN
100.0000 mg | Freq: Once | INTRAVENOUS | Status: AC
  Administered 2020-08-21: 100 mg via INTRAVENOUS

## 2020-08-21 MED ORDER — INSULIN GLARGINE 100 UNIT/ML ~~LOC~~ SOLN
24.0000 [IU] | Freq: Every day | SUBCUTANEOUS | Status: DC
  Administered 2020-08-22: 24 [IU] via SUBCUTANEOUS
  Filled 2020-08-21 (×3): qty 0.24

## 2020-08-21 MED ORDER — ASPIRIN EC 81 MG PO TBEC
81.0000 mg | DELAYED_RELEASE_TABLET | Freq: Every day | ORAL | Status: DC
  Administered 2020-08-22 – 2020-08-23 (×2): 81 mg via ORAL
  Filled 2020-08-21 (×2): qty 1

## 2020-08-21 MED ORDER — ACETAMINOPHEN 325 MG PO TABS
650.0000 mg | ORAL_TABLET | Freq: Four times a day (QID) | ORAL | Status: DC | PRN
  Administered 2020-08-22 – 2020-08-23 (×3): 650 mg via ORAL
  Filled 2020-08-21 (×3): qty 2

## 2020-08-21 MED ORDER — INSULIN ASPART 100 UNIT/ML ~~LOC~~ SOLN
0.0000 [IU] | Freq: Three times a day (TID) | SUBCUTANEOUS | Status: DC
  Administered 2020-08-22: 11 [IU] via SUBCUTANEOUS
  Administered 2020-08-22 (×2): 5 [IU] via SUBCUTANEOUS
  Administered 2020-08-23: 8 [IU] via SUBCUTANEOUS
  Administered 2020-08-23: 5 [IU] via SUBCUTANEOUS

## 2020-08-21 MED ORDER — HYDRALAZINE HCL 25 MG PO TABS
25.0000 mg | ORAL_TABLET | Freq: Three times a day (TID) | ORAL | Status: DC
  Administered 2020-08-22 (×2): 25 mg via ORAL
  Filled 2020-08-21 (×2): qty 1

## 2020-08-21 MED ORDER — ACETAMINOPHEN 325 MG PO TABS
650.0000 mg | ORAL_TABLET | Freq: Once | ORAL | Status: AC
  Administered 2020-08-21: 650 mg via ORAL
  Filled 2020-08-21: qty 2

## 2020-08-21 MED ORDER — GABAPENTIN 300 MG PO CAPS
600.0000 mg | ORAL_CAPSULE | Freq: Two times a day (BID) | ORAL | Status: DC
  Administered 2020-08-22 – 2020-08-23 (×4): 600 mg via ORAL
  Filled 2020-08-21 (×4): qty 2

## 2020-08-21 MED ORDER — SODIUM CHLORIDE 0.9 % IV SOLN
100.0000 mg | Freq: Every day | INTRAVENOUS | Status: DC
  Administered 2020-08-22 – 2020-08-23 (×2): 100 mg via INTRAVENOUS
  Filled 2020-08-21 (×2): qty 20

## 2020-08-21 MED ORDER — LOSARTAN POTASSIUM 50 MG PO TABS
100.0000 mg | ORAL_TABLET | Freq: Every day | ORAL | Status: DC
  Administered 2020-08-22 – 2020-08-23 (×2): 100 mg via ORAL
  Filled 2020-08-21 (×2): qty 2

## 2020-08-21 NOTE — ED Triage Notes (Signed)
Pt. Reports she has shortness of breath and she has been to a funeral recently and now has multiple symptoms of COVID that many of the family members have [place].  Pt. Reports loss of taste now today.  Pt. Reports the cough is always present and now she is congested.

## 2020-08-21 NOTE — Plan of Care (Signed)
Admission requested from [facility] ED.  Spoke to Dr. Almyra Free.  Patient is a 70 year old female with a past medical history of CAD, hypertension, hyperlipidemia, diabetes, obesity presenting with a 4-day history of cough and shortness of breath.  She is unvaccinated and SARS-CoV-2 PCR test positive.  Satting 88-89% on room air, sats improved with 2 L supplemental oxygen.  Chest x-ray showing focal right infrahilar opacity.  No leukocytosis on labs.  Lactate normal.  Patient was given Decadron and remdesivir.  Bed request placed.  [facility] will assume care on arrival to accepting facility. Until arrival, care as per EDP. However, [facility] available 24/7 for questions and assistance.

## 2020-08-21 NOTE — ED Notes (Signed)
Pt ambulated to the bedside commode  

## 2020-08-21 NOTE — ED Provider Notes (Signed)
[facility] EMERGENCY DEPARTMENT Provider Note   CSN: [id number] Arrival date & time: 08/21/20  1510     History Chief Complaint  Patient presents with  . Shortness of Breath    Destiny Ballard is a 70 y.o. female.  Patient presenting with cough shortness of breath.  Symptoms have been ongoing for 4 to 5 days.  She states that she was at the funeral of her nephew who passed away due to Covid complications.  She states multiple visuals in her family also have tested positive for Covid.  Otherwise denies any chest pain or abdominal pain.  No vomiting or diarrhea.  Patient states she had not been previously vaccinated.        Past Medical History:  Diagnosis Date  . Anxiety    Prior suicide attempt  . CAD (coronary artery disease)    a) s/p DES to LAD 07/2005 b) Last Myoview low risk 11/2011 showing small fixed apical perfusion defect (prior MI vs attenuation) but no ischemia - normal EF.  [name] Cervical spondylosis   . Coronary atherosclerosis 06/28/2008  . Depression   . Depression with anxiety   . Diabetes mellitus without complication ([place])   . GERD (gastroesophageal reflux disease)   . Hyperlipidemia   . Hypertension   . Insulin resistance   . Iron deficiency anemia   . Obesity     Patient Active Problem List   Diagnosis Date Noted  . Pneumonia due to COVID-19 virus 08/21/2020  . Uncontrolled type 2 diabetes mellitus with complication, with long-term current use of insulin ([place]) 03/14/2020  . Type 2 diabetes mellitus with hyperglycemia, with long-term current use of insulin ([place]) 12/10/2019  . Type 2 diabetes mellitus with diabetic polyneuropathy, with long-term current use of insulin ([place]) 12/10/2019  . Obesity (BMI 30-39.9) 08/24/2018  . Depression with anxiety 08/24/2018  . CAD (coronary artery disease) 08/24/2018  . Abnormal cardiovascular stress test 08/24/2018  . Lobar pneumonia, unspecified organism ([place]) 11/13/2017  . Precordial chest pain   .  Sepsis ([place]) 09/18/2017  . Chronic diastolic CHF (congestive heart failure) ([place]) 09/18/2017  . Diabetes mellitus ([place]) 09/08/2017  . Left hip pain 10/28/2016  . Chronic rhinitis 08/07/2016  . COPD with acute exacerbation ([place]) 04/25/2016  . Essential hypertension 01/30/2016  . Hypokalemia 01/27/2016  . HLD (hyperlipidemia)   . Type 2 diabetes mellitus without complication, without long-term current use of insulin ([place])   . OSA (obstructive sleep apnea) 05/06/2014  . Dyspnea on exertion 09/02/2012  . Chest pain 12/10/2011  . Chronic cough 05/11/2010  . GERD 06/28/2008    Past Surgical History:  Procedure Laterality Date  . BREAST ENHANCEMENT SURGERY    . CARDIAC CATHETERIZATION  06/17/2007   NORMAL. EF 60%  . CARDIAC CATHETERIZATION N/A 01/29/2016   Procedure: Left Heart Cath and Coronary Angiography;  Surgeon: Sherren Mocha, MD;  Location: [facility];  Service: Cardiovascular;  Laterality: N/A;  . CERVICAL SPONDYLOSIS     SINGLE LEVEL FUSION  . CHILDBIRTH     X3  . CORONARY STENT PLACEMENT  07/2005   LEFT ANTERIOR DESCENDING  . FOREARM FRACTURE SURGERY  2010   hand and shoulder   . INCISION AND DRAINAGE BREAST ABSCESS  01/05/2012      . INCISION AND DRAINAGE PERIRECTAL ABSCESS N/A 02/18/2014   Procedure: IRRIGATION AND DEBRIDEMENT PERIRECTAL ABSCESS;  Surgeon: Pedro Earls, MD;  Location: [facility];  Service: General;  Laterality: N/A;  . LEFT HEART CATH AND  CORONARY ANGIOGRAPHY N/A 10/10/2017   Procedure: LEFT HEART CATH AND CORONARY ANGIOGRAPHY;  Surgeon: Burnell Blanks, MD;  Location: [facility];  Service: Cardiovascular;  Laterality: N/A;  . LUMBAR LAMINECTOMY    . ROTATOR CUFF REPAIR     [place]  . TONSILLECTOMY AND ADENOIDECTOMY    . TUBAL LIGATION    . VIDEO BRONCHOSCOPY Bilateral 08/13/2016   Procedure: VIDEO BRONCHOSCOPY WITHOUT FLUORO;  Surgeon: Collene Gobble, MD;  Location: [facility];  Service: Cardiopulmonary;  Laterality: Bilateral;      OB History   No obstetric history on file.     Family History  Problem Relation Age of Onset  . Heart attack Mother   . Diabetes Mother   . Lung cancer Mother   . Asthma Mother   . Heart disease Mother   . Suicidality Father        "killed himself"  . Asthma Daughter        x 2  . Cancer Daughter        pre-cancerous polyp  . Diabetes Sister   . Cancer Sister   . Cervical cancer Daughter        cervical   . Allergies Other        all family--seasonal allergies    Social History   Tobacco Use  . Smoking status: Passive Smoke Exposure - Never Smoker  . Smokeless tobacco: Never Used  Vaping Use  . Vaping Use: Never used  Substance Use Topics  . Alcohol use: Yes    Comment: occ  . Drug use: No    Home Medications Prior to Admission medications   Medication Sig Start Date End Date Taking? Authorizing Provider  albuterol (VENTOLIN HFA) 108 (90 Base) MCG/ACT inhaler INHALE 2 PUFFS INTO THE LUNGS EVERY 8 (EIGHT) HOURS AS NEEDED FOR WHEEZING OR SHORTNESS OF BREATH 04/03/20   Copland, Gay Filler, MD  aspirin EC 81 MG tablet Take 81 mg by mouth daily.    [provider]  benzonatate (TESSALON) 200 MG capsule Take 200 mg by mouth 3 (three) times daily as needed. 03/15/20   [provider]  benzonatate (TESSALON) 200 MG capsule Take 1 capsule (200 mg total) by mouth 3 (three) times daily as needed for cough. 06/19/20   Copland, Gay Filler, MD  calcium carbonate (TUMS - DOSED IN MG ELEMENTAL CALCIUM) 500 MG chewable tablet Chew 2 tablets by mouth daily as needed for indigestion or heartburn.    [provider]  cetirizine (ZYRTEC) 10 MG tablet TAKE 1 TABLET BY MOUTH DAILY AS NEEDED FOR ALLERGIES 12/16/17   Copland, Gay Filler, MD  Cholecalciferol (VITAMIN D3) 1000 UNITS CAPS Take 1,000 Units by mouth daily.     [provider]  dapagliflozin propanediol (FARXIGA) 10 MG TABS tablet Take 1 tablet (10 mg total) by mouth daily. 12/10/19   Shamleffer,  Melanie Crazier, MD  Dulaglutide (TRULICITY) [id number] [id number] SOPN Inject 0.5 mLs (0.75 mg total) into the skin once a week. 03/14/20   Shamleffer, Melanie Crazier, MD  FLUoxetine (PROZAC) 40 MG capsule TAKE 1 CAPSULE BY MOUTH EVERY DAY 02/28/20   Copland, Gay Filler, MD  fluticasone (FLONASE) 50 MCG/ACT nasal spray SPRAY 2 SPRAYS INTO EACH NOSTRIL EVERY DAY 06/08/18   Collene Gobble, MD  furosemide (LASIX) 20 MG tablet TAKE 1 TABLET BY MOUTH EVERY DAY 07/28/19   Burtis Junes, NP  gabapentin (NEURONTIN) 300 MG capsule TAKE 2 CAPSULES BY MOUTH TWICE A DAY 03/16/19  Copland, Gay Filler, MD  hydrALAZINE (APRESOLINE) 25 MG tablet Patient needs to schedule appointment for any future refills.  Please call [phone].  1st attempt. 02/07/20   Burtis Junes, NP  hydrOXYzine (ATARAX/VISTARIL) 25 MG tablet TAKE 1/2 TO 1 TABLET BY MOUTH EVERY 8 HOURS AS NEEDED FOR ITCHING 12/22/19   Copland, Gay Filler, MD  Insulin Glargine (BASAGLAR KWIKPEN) 100 UNIT/ML Inject 0.24 mLs (24 Units total) into the skin daily. 12/10/19   Shamleffer, Melanie Crazier, MD  Insulin Pen Needle 32G X 4 MM MISC 1 Device by Does not apply route as directed. 12/10/19   Shamleffer, Melanie Crazier, MD  losartan (COZAAR) 100 MG tablet Take 1 tablet (100 mg total) by mouth daily. 09/10/17   Burtis Junes, NP  metFORMIN (GLUCOPHAGE-XR) 500 MG 24 hr tablet Take 2 tablets (1,000 mg total) by mouth 2 (two) times daily. 08/15/20   Shamleffer, Melanie Crazier, MD  methocarbamol (ROBAXIN) 750 MG tablet TAKE 1 TABLET (750 MG TOTAL) BY MOUTH EVERY 12 (TWELVE) HOURS AS NEEDED FOR MUSCLE SPASMS. 08/03/19   Copland, Gay Filler, MD  metoprolol succinate (TOPROL-XL) 100 MG 24 hr tablet TAKE 1 TABLET BY MOUTH EVERY DAY 12/10/19   Copland, Gay Filler, MD  montelukast (SINGULAIR) 10 MG tablet TAKE 1 TABLET BY MOUTH EVERY DAY AS NEEDED FOR ALLERGIES 10/20/19   Copland, Gay Filler, MD  nitroGLYCERIN (NITROSTAT) 0.4 MG SL tablet Place 1 tablet (0.4 mg total) under the tongue  every 5 (five) minutes as needed for chest pain. 09/01/17   Copland, Gay Filler, MD  Polyvinyl Alcohol-Povidone (REFRESH OP) Place 2 drops into both eyes daily as needed (for dry eyes).    [provider]  rosuvastatin (CRESTOR) 20 MG tablet TAKE 1 TABLET BY MOUTH EVERY DAY 06/10/20   Copland, Gay Filler, MD  Tiotropium Bromide Monohydrate (SPIRIVA RESPIMAT) 2.5 MCG/ACT AERS Inhale 2 puffs into the lungs daily. 10/19/19   Colon Branch, MD  traZODone (DESYREL) 50 MG tablet TAKE 1/2 TO 1 TABLET BY MOUTH AT BEDTIME AS NEEDED FOR SLEEP 01/18/20   Copland, Gay Filler, MD  triamcinolone cream (KENALOG) 0.1 % Apply 1 application topically 2 (two) times daily. Use once or twice a day for poison ivy- apply sparingly to face.  Use no longer than 1 week 11/26/18   Copland, Gay Filler, MD    Allergies    Prednisone  Review of Systems   Review of Systems  Constitutional: Positive for fever.  HENT: Negative for ear pain.   Eyes: Negative for pain.  Respiratory: Positive for cough and shortness of breath.   Cardiovascular: Negative for chest pain.  Gastrointestinal: Negative for abdominal pain.  Genitourinary: Negative for flank pain.  Musculoskeletal: Negative for back pain.  Skin: Negative for rash.  Neurological: Negative for headaches.    Physical Exam Updated Vital Signs BP (!) 160/62 (BP Location: Left Arm)   Pulse (!) 104   Temp 98.3 F (36.8 C) (Oral)   Resp 20   Ht 4\' 11"  (1.499 m)   Wt 81.6 kg   SpO2 96%   BMI 36.36 kg/m   Physical Exam Constitutional:      General: She is not in acute distress.    Appearance: Normal appearance.  HENT:     Head: Normocephalic.     Nose: Nose normal.  Eyes:     Extraocular Movements: Extraocular movements intact.  Cardiovascular:     Rate and Rhythm: Normal rate.  Pulmonary:     Effort: Pulmonary  effort is normal.  Musculoskeletal:        General: Normal range of motion.     Cervical back: Normal range of motion.  Neurological:      General: No focal deficit present.     Mental Status: She is alert. Mental status is at baseline.     ED Results / Procedures / Treatments   Labs (all labs ordered are listed, but only abnormal results are displayed) Labs Reviewed  SARS CORONAVIRUS 2 BY RT PCR ([facility]) - Abnormal; Notable for the following components:      Result Value   SARS Coronavirus 2 POSITIVE (*)    All other components within normal limits  CBC WITH DIFFERENTIAL/PLATELET - Abnormal; Notable for the following components:   MCV 78.9 (*)    MCH 24.9 (*)    Monocytes Absolute 1.2 (*)    All other components within normal limits  COMPREHENSIVE METABOLIC PANEL - Abnormal; Notable for the following components:   Glucose, Bld 155 (*)    All other components within normal limits  CULTURE, BLOOD (ROUTINE X 2)  CULTURE, BLOOD (ROUTINE X 2)  LACTIC ACID, PLASMA  D-DIMER, QUANTITATIVE (NOT AT [facility])  PROCALCITONIN  LACTATE DEHYDROGENASE  FERRITIN  TRIGLYCERIDES  FIBRINOGEN  C-REACTIVE PROTEIN  LACTIC ACID, PLASMA  LACTIC ACID, PLASMA    EKG None  Radiology DG Chest Port 1 View  Result Date: 08/21/2020 CLINICAL DATA:  Shortness of breath, possible COVID exposure at recent funeral EXAM: PORTABLE CHEST 1 VIEW COMPARISON:  CT 03/18/2020, radiograph 03/18/2020 FINDINGS: Increased attenuation towards the lung bases likely combination of patient body habitus and breast prostheses. Focal opacity seen in the right infrahilar lung. No other new airspace process is seen. Cardiomediastinal contours are stable. Healed appearance of several right-sided rib fractures. No acute osseous abnormality or suspicious osseous lesion. Prior cervical fusion is incompletely assessed on this exam. IMPRESSION: Focal right infrahilar opacity could reflect pneumonia or atelectasis though should consider follow-up imaging to ensure resolution. Electronically Signed   By: Lovena Le M.D.   On:  08/21/2020 16:52    Procedures .Critical Care Performed by: Luna Fuse, MD Authorized by: Luna Fuse, MD   Critical care provider statement:    Critical care time (minutes):  40   Critical care time was exclusive of:  Separately billable procedures and treating other patients and teaching time   Critical care was necessary to treat or prevent imminent or life-threatening deterioration of the following conditions:  Respiratory failure     Medications Ordered in ED Medications  remdesivir 100 mg in sodium chloride 0.9 % 100 mL IVPB (100 mg Intravenous New Bag/Given 08/21/20 1913)    Followed by  remdesivir 100 mg in sodium chloride 0.9 % 100 mL IVPB (has no administration in time range)    Followed by  remdesivir 100 mg in sodium chloride 0.9 % 100 mL IVPB (has no administration in time range)  0.9 %  sodium chloride infusion ( Intravenous New Bag/Given 08/21/20 1909)  dexamethasone (DECADRON) injection 6 mg (6 mg Intravenous Given 08/21/20 1719)  acetaminophen (TYLENOL) tablet 650 mg (650 mg Oral Given 08/21/20 1835)    ED Course  I have reviewed the triage vital signs and the nursing notes.  Pertinent labs & imaging results that were available during my care of the patient were reviewed by me and considered in my medical decision making (see chart for details).    MDM Rules/Calculators/A&P  Patient is positive for Covid.  She is hypoxic to about 8889% on room air.  Given IV Decadron and IV remdesivir.  Case discussed with medicine for admission for Covid infection and hypoxemia.   Final Clinical Impression(s) / ED Diagnoses Final diagnoses:  [id number] virus infection  Hypoxemia    Rx / DC Orders ED Discharge Orders    None       Luna Fuse, MD 08/21/20 1919

## 2020-08-21 NOTE — H&P (Signed)
History and Physical    ZENAIDA TESAR ZOX:[id number] DOB: 1950-07-21 DOA: 08/21/2020  PCP: Darreld Mclean, MD  Patient coming from: [facility] ED  I have personally briefly reviewed patient's old medical records in [place]  Chief Complaint: Shortness of breath  HPI: Destiny Ballard is a 70 y.o. female with medical history significant for CAD s/p DES to LAD 2007, COPD, T2DM, HTN, HLD, anxiety/depression, and OSA not using CPAP who presents to the ED for evaluation of dyspnea.  Patient reports 3 days of worsening nonproductive cough, dyspnea on exertion, chest congestion, chills and diaphoresis, and nausea without emesis, and semiloose stools.  She says she recently attended her nephew's funeral who unfortunately passed from COVID-19.  She says she had exposure to multiple family members.  She also states that her niece is currently hospitalized due to COVID-19.  She denies any subjective fevers, chest pain, abdominal pain, dysuria, or lower extremity swelling.  She has not received any of the COVID-19 vaccinations.  [facility] ED Course:  Initial vitals showed BP 173/73, pulse 95, RR 20, temp 98.3 F, SPO2 initially 95% on room air. Patient noted to have drop in SPO2 to 89% on room air and subsequently placed on 2 L supplemental O2 via [place] with improvement to 97%.  Labs showed WBC 8.2, hemoglobin 12.6, platelets 209,000, sodium 135, potassium 3.6, bicarb 26, BUN 14, creatinine 0.69, serum glucose 155, LFTs within normal limits, lactic acid 1.0.  SARS-CoV-2 PCR test is positive. D-dimer 0.38, procalcitonin <0.10, CRP 4.8. Blood cultures obtained and pending.  Portable chest x-ray shows focal right infrahilar opacity.  Patient was given IV Decadron 6 mg and started on IV remdesivir. The hospitalist service was consulted to admit for further evaluation and management.  Review of Systems: All systems reviewed and are negative except as documented in history  of present illness above.   Past Medical History:  Diagnosis Date  . Anxiety    Prior suicide attempt  . CAD (coronary artery disease)    a) s/p DES to LAD 07/2005 b) Last Myoview low risk 11/2011 showing small fixed apical perfusion defect (prior MI vs attenuation) but no ischemia - normal EF.  [name] Cervical spondylosis   . Coronary atherosclerosis 06/28/2008  . Depression   . Depression with anxiety   . Diabetes mellitus without complication ([place])   . GERD (gastroesophageal reflux disease)   . Hyperlipidemia   . Hypertension   . Insulin resistance   . Iron deficiency anemia   . Obesity     Past Surgical History:  Procedure Laterality Date  . BREAST ENHANCEMENT SURGERY    . CARDIAC CATHETERIZATION  06/17/2007   NORMAL. EF 60%  . CARDIAC CATHETERIZATION N/A 01/29/2016   Procedure: Left Heart Cath and Coronary Angiography;  Surgeon: Sherren Mocha, MD;  Location: [place] [facility];  Service: Cardiovascular;  Laterality: N/A;  . CERVICAL SPONDYLOSIS     SINGLE LEVEL FUSION  . CHILDBIRTH     X3  . CORONARY STENT PLACEMENT  07/2005   LEFT ANTERIOR DESCENDING  . FOREARM FRACTURE SURGERY  2010   hand and shoulder   . INCISION AND DRAINAGE BREAST ABSCESS  01/05/2012      . INCISION AND DRAINAGE PERIRECTAL ABSCESS N/A 02/18/2014   Procedure: IRRIGATION AND DEBRIDEMENT PERIRECTAL ABSCESS;  Surgeon: Pedro Earls, MD;  Location: [facility];  Service: General;  Laterality: N/A;  . LEFT HEART CATH AND CORONARY ANGIOGRAPHY N/A 10/10/2017  Procedure: LEFT HEART CATH AND CORONARY ANGIOGRAPHY;  Surgeon: Burnell Blanks, MD;  Location: [place] [facility];  Service: Cardiovascular;  Laterality: N/A;  . LUMBAR LAMINECTOMY    . ROTATOR CUFF REPAIR     bilaterla  . TONSILLECTOMY AND ADENOIDECTOMY    . TUBAL LIGATION    . VIDEO BRONCHOSCOPY Bilateral 08/13/2016   Procedure: VIDEO BRONCHOSCOPY WITHOUT FLUORO;  Surgeon: Collene Gobble, MD;  Location: [facility];  Service: Cardiopulmonary;   Laterality: Bilateral;    Social History:  reports that she is a non-smoker but has been exposed to tobacco smoke. She has never used smokeless tobacco. She reports current alcohol use. She reports that she does not use drugs.  Allergies  Allergen Reactions  . Prednisone Other (See Comments)    REACTION: mood swings, nightmares. "Shot doesn't bother me, reaction is just with the pill" she states she has had the steroid injections before. From our records methylprednisone was given to her in 2013 without any complications.    Family History  Problem Relation Age of Onset  . Heart attack Mother   . Diabetes Mother   . Lung cancer Mother   . Asthma Mother   . Heart disease Mother   . Suicidality Father        "killed himself"  . Asthma Daughter        x 2  . Cancer Daughter        pre-cancerous polyp  . Diabetes Sister   . Cancer Sister   . Cervical cancer Daughter        cervical   . Allergies Other        all family--seasonal allergies     Prior to Admission medications   Medication Sig Start Date End Date Taking? Authorizing Provider  albuterol (VENTOLIN HFA) 108 (90 Base) MCG/ACT inhaler INHALE 2 PUFFS INTO THE LUNGS EVERY 8 (EIGHT) HOURS AS NEEDED FOR WHEEZING OR SHORTNESS OF BREATH 04/03/20   Copland, Gay Filler, MD  aspirin EC 81 MG tablet Take 81 mg by mouth daily.    [provider]  benzonatate (TESSALON) 200 MG capsule Take 200 mg by mouth 3 (three) times daily as needed. 03/15/20   [provider]  benzonatate (TESSALON) 200 MG capsule Take 1 capsule (200 mg total) by mouth 3 (three) times daily as needed for cough. 06/19/20   Copland, Gay Filler, MD  calcium carbonate (TUMS - DOSED IN MG ELEMENTAL CALCIUM) 500 MG chewable tablet Chew 2 tablets by mouth daily as needed for indigestion or heartburn.    [provider]  cetirizine (ZYRTEC) 10 MG tablet TAKE 1 TABLET BY MOUTH DAILY AS NEEDED FOR ALLERGIES 12/16/17   Copland, Gay Filler, MD   Cholecalciferol (VITAMIN D3) 1000 UNITS CAPS Take 1,000 Units by mouth daily.     [provider]  dapagliflozin propanediol (FARXIGA) 10 MG TABS tablet Take 1 tablet (10 mg total) by mouth daily. 12/10/19   Shamleffer, Melanie Crazier, MD  Dulaglutide (TRULICITY) 5.40 JW/1.1BJ SOPN Inject 0.5 mLs (0.75 mg total) into the skin once a week. 03/14/20   Shamleffer, Melanie Crazier, MD  FLUoxetine (PROZAC) 40 MG capsule TAKE 1 CAPSULE BY MOUTH EVERY DAY 02/28/20   Copland, Gay Filler, MD  fluticasone (FLONASE) 50 MCG/ACT nasal spray SPRAY 2 SPRAYS INTO EACH NOSTRIL EVERY DAY 06/08/18   Collene Gobble, MD  furosemide (LASIX) 20 MG tablet TAKE 1 TABLET BY MOUTH EVERY DAY 07/28/19   Burtis Junes, NP  gabapentin (  NEURONTIN) 300 MG capsule TAKE 2 CAPSULES BY MOUTH TWICE A DAY 03/16/19   Copland, Gay Filler, MD  hydrALAZINE (APRESOLINE) 25 MG tablet Patient needs to schedule appointment for any future refills.  Please call [phone].  1st attempt. 02/07/20   Burtis Junes, NP  hydrOXYzine (ATARAX/VISTARIL) 25 MG tablet TAKE 1/2 TO 1 TABLET BY MOUTH EVERY 8 HOURS AS NEEDED FOR ITCHING 12/22/19   Copland, Gay Filler, MD  Insulin Glargine (BASAGLAR KWIKPEN) 100 UNIT/ML Inject 0.24 mLs (24 Units total) into the skin daily. 12/10/19   Shamleffer, Melanie Crazier, MD  Insulin Pen Needle 32G X 4 MM MISC 1 Device by Does not apply route as directed. 12/10/19   Shamleffer, Melanie Crazier, MD  losartan (COZAAR) 100 MG tablet Take 1 tablet (100 mg total) by mouth daily. 09/10/17   Burtis Junes, NP  metFORMIN (GLUCOPHAGE-XR) 500 MG 24 hr tablet Take 2 tablets (1,000 mg total) by mouth 2 (two) times daily. 08/15/20   Shamleffer, Melanie Crazier, MD  methocarbamol (ROBAXIN) 750 MG tablet TAKE 1 TABLET (750 MG TOTAL) BY MOUTH EVERY 12 (TWELVE) HOURS AS NEEDED FOR MUSCLE SPASMS. 08/03/19   Copland, Gay Filler, MD  metoprolol succinate (TOPROL-XL) 100 MG 24 hr tablet TAKE 1 TABLET BY MOUTH EVERY DAY 12/10/19   Copland,  Gay Filler, MD  montelukast (SINGULAIR) 10 MG tablet TAKE 1 TABLET BY MOUTH EVERY DAY AS NEEDED FOR ALLERGIES 10/20/19   Copland, Gay Filler, MD  nitroGLYCERIN (NITROSTAT) 0.4 MG SL tablet Place 1 tablet (0.4 mg total) under the tongue every 5 (five) minutes as needed for chest pain. 09/01/17   Copland, Gay Filler, MD  Polyvinyl Alcohol-Povidone (REFRESH OP) Place 2 drops into both eyes daily as needed (for dry eyes).    [provider]  rosuvastatin (CRESTOR) 20 MG tablet TAKE 1 TABLET BY MOUTH EVERY DAY 06/10/20   Copland, Gay Filler, MD  Tiotropium Bromide Monohydrate (SPIRIVA RESPIMAT) 2.5 MCG/ACT AERS Inhale 2 puffs into the lungs daily. 10/19/19   Colon Branch, MD  traZODone (DESYREL) 50 MG tablet TAKE 1/2 TO 1 TABLET BY MOUTH AT BEDTIME AS NEEDED FOR SLEEP 01/18/20   Copland, Gay Filler, MD  triamcinolone cream (KENALOG) 0.1 % Apply 1 application topically 2 (two) times daily. Use once or twice a day for poison ivy- apply sparingly to face.  Use no longer than 1 week 11/26/18   Copland, Gay Filler, MD    Physical Exam: Vitals:   08/21/20 1841 08/21/20 1900 08/21/20 2000 08/21/20 2100  BP: (!) 160/62 (!) 158/66 (!) 144/71 (!) 146/66  Pulse: (!) 104 87 84 84  Resp: 20 16 17 17   Temp:      TempSrc:      SpO2: 96% 97% 98% 95%  Weight:      Height:       Constitutional: Resting in bed, NAD, calm, comfortable Eyes: PERRL, lids and conjunctivae normal ENMT: Mucous membranes are moist. Posterior pharynx clear of any exudate or lesions.Normal dentition.  Neck: normal, supple, no masses. Respiratory: Inspiratory crackles right lung base otherwise clear to auscultation.  Normal respiratory effort while on 2 L supplemental O2 via [place]. No accessory muscle use.  Cardiovascular: Regular rate and rhythm, 2/6 systolic murmur. No extremity edema. 2+ pedal pulses. Abdomen: no tenderness, no masses palpated. No hepatosplenomegaly. Bowel sounds positive.  Musculoskeletal: no clubbing / cyanosis. No joint  deformity upper and lower extremities. Good ROM, no contractures. Normal muscle tone.  Skin: no rashes, lesions, ulcers. No  induration Neurologic: CN 2-12 grossly intact. Sensation intact. Strength 5/5 in all 4.  Psychiatric: Normal judgment and insight. Alert and oriented x 3. Normal mood.   Labs on Admission: I have personally reviewed following labs and imaging studies  CBC: Recent Labs  Lab 08/21/20 1653  WBC 8.2  NEUTROABS 5.1  HGB 12.6  HCT 39.9  MCV 78.9*  PLT 662   Basic Metabolic Panel: Recent Labs  Lab 08/21/20 1653  NA 135  K 3.6  CL 98  CO2 26  GLUCOSE 155*  BUN 14  CREATININE 0.69  CALCIUM 9.1   GFR: Estimated Creatinine Clearance: 59.7 mL/min (by C-G formula based on SCr of 0.69 mg/dL). Liver Function Tests: Recent Labs  Lab 08/21/20 1653  AST 24  ALT 41  ALKPHOS 86  BILITOT 0.4  PROT 7.5  ALBUMIN 3.5   No results for input(s): LIPASE, AMYLASE in the last 168 hours. No results for input(s): AMMONIA in the last 168 hours. Coagulation Profile: No results for input(s): INR, PROTIME in the last 168 hours. Cardiac Enzymes: No results for input(s): CKTOTAL, CKMB, CKMBINDEX, TROPONINI in the last 168 hours. BNP (last 3 results) No results for input(s): PROBNP in the last 8760 hours. HbA1C: No results for input(s): HGBA1C in the last 72 hours. CBG: No results for input(s): GLUCAP in the last 168 hours. Lipid Profile: Recent Labs    08/21/20 1653  TRIG 366*   Thyroid Function Tests: No results for input(s): TSH, T4TOTAL, FREET4, T3FREE, THYROIDAB in the last 72 hours. Anemia Panel: Recent Labs    08/21/20 1653  FERRITIN 65   Urine analysis:    Component Value Date/Time   COLORURINE YELLOW 03/18/2020 1320   APPEARANCEUR CLEAR 03/18/2020 1320   LABSPEC 1.010 03/18/2020 1320   PHURINE 5.0 03/18/2020 1320   GLUCOSEU >=500 (A) 03/18/2020 1320   HGBUR NEGATIVE 03/18/2020 1320   BILIRUBINUR NEGATIVE 03/18/2020 1320   BILIRUBINUR neg  06/18/2012 1231   KETONESUR NEGATIVE 03/18/2020 1320   PROTEINUR NEGATIVE 03/18/2020 1320   UROBILINOGEN 0.2 06/18/2012 1231   NITRITE NEGATIVE 03/18/2020 1320   LEUKOCYTESUR NEGATIVE 03/18/2020 1320    Radiological Exams on Admission: DG Chest Port 1 View  Result Date: 08/21/2020 CLINICAL DATA:  Shortness of breath, possible COVID exposure at recent funeral EXAM: PORTABLE CHEST 1 VIEW COMPARISON:  CT 03/18/2020, radiograph 03/18/2020 FINDINGS: Increased attenuation towards the lung bases likely combination of patient body habitus and breast prostheses. Focal opacity seen in the right infrahilar lung. No other new airspace process is seen. Cardiomediastinal contours are stable. Healed appearance of several right-sided rib fractures. No acute osseous abnormality or suspicious osseous lesion. Prior cervical fusion is incompletely assessed on this exam. IMPRESSION: Focal right infrahilar opacity could reflect pneumonia or atelectasis though should consider follow-up imaging to ensure resolution. Electronically Signed   By: Lovena Le M.D.   On: 08/21/2020 16:52    EKG: Personally reviewed. Normal sinus rhythm without acute ischemic changes.  Assessment/Plan Principal Problem:   Pneumonia due to COVID-19 virus Active Problems:   Depression with anxiety   CAD (coronary artery disease)   Hypertension associated with diabetes ([place])   Type 2 diabetes mellitus with diabetic polyneuropathy, with long-term current use of insulin ([facility])   Hyperlipidemia associated with type 2 diabetes mellitus ([place])  Destiny Ballard is a 70 y.o. female with medical history significant for CAD s/p DES to LAD 2007, COPD, T2DM, HTN, HLD, anxiety/depression, and OSA not using CPAP who is admitted with COVID-19  pneumonia.  COVID-19 pneumonia: SARS-CoV-2 PCR + 08/21/2020.  CXR with right lower lung infiltrate.  SPO2 down to 89% while on room air, currently stable on 2 L O2 via [place]. -Continue IV remdesivir -Start IV  Solu-Medrol 40 mg twice daily -Continue supplemental O2 as needed and wean as able -Continue incentive spirometer, flutter valve, albuterol as needed  COPD: Stable without evidence of acute exacerbation.  Continue home Spiriva and management as above.  Type 2 diabetes: Continue home Basaglar 24 units nightly, add sliding scale insulin.  Hold home Metformin, Trulicity, [place] for now.  Chronic diastolic CHF: Chronic and stable, appears euvolemic.  Continue home Lasix and monitor daily weights and I/O's.  CAD s/p DES to LAD: Chronic and stable.  Continue aspirin, statin, Toprol-XL.  Hypertension: Continue home losartan, Toprol-XL, hydralazine, Lasix.  Hyperlipidemia: Continue rosuvastatin.  Anxiety/depression: Continue fluoxetine.  OSA: Not using CPAP due to intolerance.  DVT prophylaxis: Lovenox Code Status: Full code, confirmed with patient Family Communication: Discussed with patient, she has discussed with family Disposition Plan: From home and likely discharge to home pending symptomatic and respiratory improvement Consults called: None Level of care: Telemetry Medical Admission status:  Status is: Observation  The patient remains OBS appropriate and will d/c before 2 midnights.  Dispo: The patient is from: Home              Anticipated d/c is to: Home              Anticipated d/c date is: 1 day              Patient currently is not medically stable to d/c.   Difficult to place patient No  Zada Finders MD [facility]  If 7PM-7AM, please contact night-coverage [email or web address]  08/21/2020, 11:08 PM

## 2020-08-22 DIAGNOSIS — Z818 Family history of other mental and behavioral disorders: Secondary | ICD-10-CM | POA: Diagnosis not present

## 2020-08-22 DIAGNOSIS — E1142 Type 2 diabetes mellitus with diabetic polyneuropathy: Secondary | ICD-10-CM | POA: Diagnosis present

## 2020-08-22 DIAGNOSIS — J9601 Acute respiratory failure with hypoxia: Secondary | ICD-10-CM | POA: Diagnosis present

## 2020-08-22 DIAGNOSIS — Z8249 Family history of ischemic heart disease and other diseases of the circulatory system: Secondary | ICD-10-CM | POA: Diagnosis not present

## 2020-08-22 DIAGNOSIS — Z9151 Personal history of suicidal behavior: Secondary | ICD-10-CM | POA: Diagnosis not present

## 2020-08-22 DIAGNOSIS — F418 Other specified anxiety disorders: Secondary | ICD-10-CM | POA: Diagnosis present

## 2020-08-22 DIAGNOSIS — M47812 Spondylosis without myelopathy or radiculopathy, cervical region: Secondary | ICD-10-CM | POA: Diagnosis present

## 2020-08-22 DIAGNOSIS — Z79899 Other long term (current) drug therapy: Secondary | ICD-10-CM | POA: Diagnosis not present

## 2020-08-22 DIAGNOSIS — U071 COVID-19: Secondary | ICD-10-CM | POA: Diagnosis present

## 2020-08-22 DIAGNOSIS — R0902 Hypoxemia: Secondary | ICD-10-CM | POA: Diagnosis not present

## 2020-08-22 DIAGNOSIS — J1282 Pneumonia due to coronavirus disease 2019: Secondary | ICD-10-CM

## 2020-08-22 DIAGNOSIS — J440 Chronic obstructive pulmonary disease with acute lower respiratory infection: Secondary | ICD-10-CM | POA: Diagnosis present

## 2020-08-22 DIAGNOSIS — I25118 Atherosclerotic heart disease of native coronary artery with other forms of angina pectoris: Secondary | ICD-10-CM | POA: Diagnosis not present

## 2020-08-22 DIAGNOSIS — G4733 Obstructive sleep apnea (adult) (pediatric): Secondary | ICD-10-CM | POA: Diagnosis present

## 2020-08-22 DIAGNOSIS — E785 Hyperlipidemia, unspecified: Secondary | ICD-10-CM

## 2020-08-22 DIAGNOSIS — Z7982 Long term (current) use of aspirin: Secondary | ICD-10-CM | POA: Diagnosis not present

## 2020-08-22 DIAGNOSIS — Z9851 Tubal ligation status: Secondary | ICD-10-CM | POA: Diagnosis not present

## 2020-08-22 DIAGNOSIS — I2510 Atherosclerotic heart disease of native coronary artery without angina pectoris: Secondary | ICD-10-CM | POA: Diagnosis present

## 2020-08-22 DIAGNOSIS — Z981 Arthrodesis status: Secondary | ICD-10-CM | POA: Diagnosis not present

## 2020-08-22 DIAGNOSIS — Z833 Family history of diabetes mellitus: Secondary | ICD-10-CM | POA: Diagnosis not present

## 2020-08-22 DIAGNOSIS — I5032 Chronic diastolic (congestive) heart failure: Secondary | ICD-10-CM | POA: Diagnosis present

## 2020-08-22 DIAGNOSIS — E1159 Type 2 diabetes mellitus with other circulatory complications: Secondary | ICD-10-CM | POA: Diagnosis not present

## 2020-08-22 DIAGNOSIS — K219 Gastro-esophageal reflux disease without esophagitis: Secondary | ICD-10-CM | POA: Diagnosis present

## 2020-08-22 DIAGNOSIS — E1169 Type 2 diabetes mellitus with other specified complication: Secondary | ICD-10-CM

## 2020-08-22 DIAGNOSIS — Z955 Presence of coronary angioplasty implant and graft: Secondary | ICD-10-CM | POA: Diagnosis not present

## 2020-08-22 DIAGNOSIS — I152 Hypertension secondary to endocrine disorders: Secondary | ICD-10-CM | POA: Diagnosis present

## 2020-08-22 DIAGNOSIS — E669 Obesity, unspecified: Secondary | ICD-10-CM | POA: Diagnosis present

## 2020-08-22 LAB — COMPREHENSIVE METABOLIC PANEL
ALT: 37 U/L (ref 0–44)
AST: 22 U/L (ref 15–41)
Albumin: 3.2 g/dL — ABNORMAL LOW (ref 3.5–5.0)
Alkaline Phosphatase: 92 U/L (ref 38–126)
Anion gap: 12 (ref 5–15)
BUN: 13 mg/dL (ref 8–23)
CO2: 25 mmol/L (ref 22–32)
Calcium: 9 mg/dL (ref 8.9–10.3)
Chloride: 98 mmol/L (ref 98–111)
Creatinine, Ser: 0.68 mg/dL (ref 0.44–1.00)
GFR, Estimated: >60 mL/min (ref >60)
Glucose, Bld: 238 mg/dL — ABNORMAL HIGH (ref 70–99)
Potassium: 4.1 mmol/L (ref 3.5–5.1)
Sodium: 135 mmol/L (ref 135–145)
Total Bilirubin: 0.7 mg/dL (ref 0.3–1.2)
Total Protein: 7.1 g/dL (ref 6.5–8.1)

## 2020-08-22 LAB — CBC WITH DIFFERENTIAL/PLATELET
Abs Immature Granulocytes: 0.06 10*3/uL (ref 0.00–0.07)
Basophils Absolute: 0 10*3/uL (ref 0.0–0.1)
Basophils Relative: 0 %
Eosinophils Absolute: 0 10*3/uL (ref 0.0–0.5)
Eosinophils Relative: 0 %
HCT: 40 % (ref 36.0–46.0)
Hemoglobin: 12.6 g/dL (ref 12.0–15.0)
Immature Granulocytes: 1 %
Lymphocytes Relative: 11 %
Lymphs Abs: 0.9 10*3/uL (ref 0.7–4.0)
MCH: 25 pg — ABNORMAL LOW (ref 26.0–34.0)
MCHC: 31.5 g/dL (ref 30.0–36.0)
MCV: 79.4 fL — ABNORMAL LOW (ref 80.0–100.0)
Monocytes Absolute: 0.2 10*3/uL (ref 0.1–1.0)
Monocytes Relative: 2 %
Neutro Abs: 6.9 10*3/uL (ref 1.7–7.7)
Neutrophils Relative %: 86 %
Platelets: 204 10*3/uL (ref 150–400)
RBC: 5.04 MIL/uL (ref 3.87–5.11)
RDW: 14 % (ref 11.5–15.5)
WBC: 8.1 10*3/uL (ref 4.0–10.5)
nRBC: 0 % (ref 0.0–0.2)

## 2020-08-22 LAB — C-REACTIVE PROTEIN: CRP: 4.6 mg/dL — ABNORMAL HIGH (ref <1.0)

## 2020-08-22 LAB — FERRITIN: Ferritin: 62 ng/mL (ref 11–307)

## 2020-08-22 LAB — GLUCOSE, CAPILLARY
Glucose-Capillary: 211 mg/dL — ABNORMAL HIGH (ref 70–99)
Glucose-Capillary: 222 mg/dL — ABNORMAL HIGH (ref 70–99)
Glucose-Capillary: 345 mg/dL — ABNORMAL HIGH (ref 70–99)
Glucose-Capillary: 298 mg/dL — ABNORMAL HIGH (ref 70–99)

## 2020-08-22 LAB — D-DIMER, QUANTITATIVE: D-Dimer, Quant: <0.27 ug/mL-FEU (ref 0.00–0.50)

## 2020-08-22 LAB — MAGNESIUM: Magnesium: 2 mg/dL (ref 1.7–2.4)

## 2020-08-22 LAB — PHOSPHORUS: Phosphorus: 4.2 mg/dL (ref 2.5–4.6)

## 2020-08-22 LAB — HEMOGLOBIN A1C
Hgb A1c MFr Bld: 9.3 % — ABNORMAL HIGH (ref 4.8–5.6)
Mean Plasma Glucose: 220.21 mg/dL

## 2020-08-22 MED ORDER — ALBUTEROL SULFATE HFA 108 (90 BASE) MCG/ACT IN AERS
2.0000 | INHALATION_SPRAY | Freq: Four times a day (QID) | RESPIRATORY_TRACT | Status: DC
  Administered 2020-08-22 – 2020-08-23 (×3): 2 via RESPIRATORY_TRACT
  Filled 2020-08-22: qty 6.7

## 2020-08-22 NOTE — Plan of Care (Signed)
  Problem: Education: Goal: Knowledge of risk factors and measures for prevention of condition will improve Outcome: Progressing   Problem: Coping: Goal: Psychosocial and spiritual needs will be supported Outcome: Not Progressing Note: Patient verbalized feeling stressed, patient's family member is admitted to unit also for covid   Problem: Respiratory: Goal: Will maintain a patent airway Outcome: Progressing Goal: Complications related to the disease process, condition or treatment will be avoided or minimized Outcome: Progressing   Problem: Education: Goal: Knowledge of General Education information will improve Description: Including pain rating scale, medication(s)/side effects and non-pharmacologic comfort measures Outcome: Progressing   Problem: Clinical Measurements: Goal: Ability to maintain clinical measurements within normal limits will improve Outcome: Progressing Goal: Will remain free from infection Outcome: Progressing Goal: Diagnostic test results will improve Outcome: Progressing Goal: Respiratory complications will improve Outcome: Progressing Goal: Cardiovascular complication will be avoided Outcome: Progressing   Problem: Activity: Goal: Risk for activity intolerance will decrease Outcome: Progressing   Problem: Nutrition: Goal: Adequate nutrition will be maintained Outcome: Progressing   Problem: Coping: Goal: Level of anxiety will decrease Outcome: Progressing   Problem: Elimination: Goal: Will not experience complications related to bowel motility Outcome: Progressing Goal: Will not experience complications related to urinary retention Outcome: Progressing   Problem: Pain Managment: Goal: General experience of comfort will improve Outcome: Progressing   Problem: Safety: Goal: Ability to remain free from injury will improve Outcome: Progressing   Problem: Skin Integrity: Goal: Risk for impaired skin integrity will decrease Outcome:  Progressing

## 2020-08-22 NOTE — Progress Notes (Signed)
Inpatient Diabetes Program Recommendations  AACE/ADA: New Consensus Statement on Inpatient Glycemic Control (2015)  Target Ranges:  Prepandial:   less than 140 mg/dL      Peak postprandial:   less than 180 mg/dL (1-2 hours)      Critically ill patients:  140 - 180 mg/dL   Lab Results  Component Value Date   GLUCAP 211 (H) 08/22/2020   HGBA1C 9.3 (H) 08/22/2020    Review of Glycemic Control Results for Destiny Ballard, Destiny B "[name]" (MRN [id number]) as of 08/22/2020 11:09  Ref. Range 08/22/2020 07:54  Glucose-Capillary Latest Ref Range: 70 - 99 mg/dL 211 (H)   Diabetes history: Type 2 DM Outpatient Diabetes medications: Farxiga 10 mg QD, Basaglar 28 units QD, Metformin 9798 mg BID, Trulicity 9.21 mg Qwk Current orders for Inpatient glycemic control: Lantus 24 units QHS, Novolog 0-15 units TID,  Solumedrol 40 mg BID, Decadron 6 mg x1  Inpatient Diabetes Program Recommendations:    Spoke with patient and verified outpatient diabetes medications. Patient is followed by outpatient PCP and "diabetic doctor" patient cannot remember the name.  Reviewed patient's current A1c of 9.3%. Explained what a A1c is and what it measures. Also reviewed goal A1c with patient, importance of good glucose control @ home, and blood sugar goals. Reviewed patho of DM, need for insulin, role of pancreas, impact of covid infection and steroids to glucose trends, vascular changes and comorbidities. Patient has a meter at home and reports checking blood sugars once per day, usually in the morning. Encouraged to increase frequency of checks, especially due to increased trends while on steroids. Patient agreeable. Education provided  On when to call MD. Patient plans to make another appointment with "diabetes doctor". Admits to snacking often, however, denies drinking sugary beverages. Encouraged additional protein and CHO intake mindfulness. Patient has no further questions at this time.   Thanks, Destiny Curb, MSN,  RNC-OB Diabetes Coordinator [phone] (8a-5p)

## 2020-08-22 NOTE — Progress Notes (Signed)
PROGRESS NOTE                                                                                                                                                                                                             Patient Demographics:    Destiny Ballard, is a 70 y.o. female, DOB - August 26, 1950, MQK:[id number]  Outpatient Primary MD for the patient is Copland, Gay Filler, MD   Admit date - 08/21/2020   LOS - 1  Chief Complaint  Patient presents with  . Shortness of Breath       Brief Narrative: Patient is a 70 y.o. female with PMHx of CAD s/p PCI 2007, DM-2, HTN, HLD, COPD, OSA-noncompliant with CPAP-presenting with 3-day history of cough, shortness of breath-found to have acute hypoxic respiratory failure due to presumed COVID-19 pneumonia.    Note-patient recently had Covid exposure at funeral for her nephew   COVID-19 vaccinated status: Unvaccinated  Significant Events: 2/7>> Admit to [facility] for hypoxia-COVID-19 pneumonia  Significant studies: 2/7>> chest x-ray: Focal right hilar opacity  COVID-19 medications: Steroids: 2/7>> Remdesivir: 2/7>>  Antibiotics: None  Microbiology data: 2/7 >>blood culture: No growth  Procedures: None  Consults: None  DVT prophylaxis: enoxaparin (LOVENOX) injection 40 mg Start: 08/21/20 2330    Subjective:    Destiny Ballard today feels better-titrated off oxygen earlier this morning.   Assessment  & Plan :   Acute Hypoxic Resp Failure due to Covid 19 Viral pneumonia: Appears to have mild hypoxemia-just titrated off oxygen this morning-still coughing-given her risk factors-reasonable to continue with IV steroid/Remdesivir to ensure continued clinical improvement before consideration of discharge.  Will reassess tomorrow.  Fever: afebrile O2 requirements:  SpO2: 100 % O2 Flow Rate (L/min): 2 L/min   COVID-19 Labs: Recent Labs    08/21/20 1653 08/22/20 0101   DDIMER 0.38 <0.27  FERRITIN 65 62  LDH 103  --   CRP 4.8* 4.6*       Component Value Date/Time   BNP 45.7 03/18/2020 1530   BNP 5.7 02/10/2013 1418    Recent Labs  Lab 08/21/20 1653  PROCALCITON <0.10    Lab Results  Component Value Date   SARSCOV2NAA POSITIVE (A) 08/21/2020   [place] Not Detected 08/11/2020   SARSCOV2NAA Not Detected 06/19/2020   [place] NEGATIVE 03/18/2020     Prone/Incentive Spirometry: encouraged  incentive spirometry use 3-4/hour.  CAD s/p DES to LAD in 2007: No anginal symptoms-continue aspirin/statin/beta-blocker  HTN: Controlled-continue losartan/Toprol/hydralazine and Lasix  HLD: Continue statin  DM-2: CBG stable-continue Lantus 24 units daily, and SSI  Recent Labs    08/21/20 2337 08/22/20 0754  GLUCAP 248* 211*    COPD: Stable-continue bronchodilators  OSA: Noncompliant to CPAP due to intolerance  Anxiety/depression: Stable-continue Prozac  Obesity: Estimated body mass index is 35.58 kg/m as calculated from the following:   Height as of this encounter: 4\' 11"  (1.499 m).   Weight as of this encounter: 79.9 kg.    ABG: No results found for: PHART, PCO2ART, PO2ART, HCO3, TCO2, ACIDBASEDEF, O2SAT  Vent Settings: N/A    Condition - Stable  Family Communication  :  Daughter-Mildred-[phone]-voicemail left on 2/8  Code Status :  Full Code  Diet :  Diet Order            Diet heart healthy/carb modified Room service appropriate? Yes; Fluid consistency: Thin  Diet effective now                  Disposition Plan  :   Status is: Observation  The patient will require care spanning > 2 midnights and should be moved to inpatient because: Inpatient level of care appropriate due to severity of illness  Dispo: The patient is from: Home              Anticipated d/c is to: Home              Anticipated d/c date is: 1 day              Patient currently is not medically stable to d/c.   Difficult to place  patient No    Barriers to discharge: Hypoxia requiring O2 supplementation/complete 5 days of IV Remdesivir  Antimicorbials  :    Anti-infectives (From admission, onward)   Start     Dose/Rate Route Frequency Ordered Stop   08/22/20 1000  remdesivir 100 mg in sodium chloride 0.9 % 100 mL IVPB       "Followed by" Linked Group Details   100 mg 200 mL/hr over 30 Minutes Intravenous Daily 08/21/20 1845 08/26/20 0959   08/21/20 1930  remdesivir 100 mg in sodium chloride 0.9 % 100 mL IVPB       "Followed by" Linked Group Details   100 mg 200 mL/hr over 30 Minutes Intravenous  Once 08/21/20 1845 08/21/20 2023   08/21/20 1845  remdesivir 100 mg in sodium chloride 0.9 % 100 mL IVPB       "Followed by" Linked Group Details   100 mg 200 mL/hr over 30 Minutes Intravenous  Once 08/21/20 1845 08/21/20 1943      Inpatient Medications  Scheduled Meds: . vitamin C  500 mg Oral Daily  . aspirin EC  81 mg Oral Daily  . enoxaparin (LOVENOX) injection  40 mg Subcutaneous QHS  . FLUoxetine  40 mg Oral Daily  . furosemide  20 mg Oral Daily  . gabapentin  600 mg Oral BID  . hydrALAZINE  25 mg Oral Q8H  . insulin aspart  0-15 Units Subcutaneous TID WC  . insulin glargine  24 Units Subcutaneous QHS  . losartan  100 mg Oral Daily  . methylPREDNISolone (SOLU-MEDROL) injection  40 mg Intravenous Q12H  . metoprolol succinate  100 mg Oral Daily  . rosuvastatin  20 mg Oral Daily  . umeclidinium bromide  1 puff Inhalation Daily  .  zinc sulfate  220 mg Oral Daily   Continuous Infusions: . sodium chloride Stopped (08/21/20 2300)  . remdesivir 100 mg in NS 100 mL 100 mg (08/22/20 0832)   PRN Meds:.sodium chloride, acetaminophen, albuterol, chlorpheniramine-HYDROcodone, guaiFENesin-dextromethorphan, ondansetron **OR** ondansetron (ZOFRAN) IV   Time Spent in minutes  25  See all Orders from today for further details   Oren Binet M.D on 08/22/2020 at 11:32 AM  To page go to [email or web address] - use  universal password  [facility] -  Office  [phone]    Objective:   Vitals:   08/21/20 2000 08/21/20 2100 08/21/20 2352 08/22/20 0600  BP: (!) 144/71 (!) 146/66 (!) 157/69 (!) 152/74  Pulse: 84 84 92 75  Resp: 17 17 15 13   Temp:   97.6 F (36.4 C) (!) 97.5 F (36.4 C)  TempSrc:   Oral Oral  SpO2: 98% 95% 100% 100%  Weight:    79.9 kg  Height:        Wt Readings from Last 3 Encounters:  08/22/20 79.9 kg  03/18/20 83.9 kg  03/14/20 83.9 kg     Intake/Output Summary (Last 24 hours) at 08/22/2020 1132 Last data filed at 08/22/2020 0500 Gross per 24 hour  Intake 211.35 ml  Output --  Net 211.35 ml     Physical Exam Gen Exam:Alert awake-not in any distress HEENT:atraumatic, normocephalic Chest: B/L clear to auscultation anteriorly CVS:S1S2 regular Abdomen:soft non tender, non distended Extremities:no edema Neurology: Non focal Skin: no rash   Data Review:    CBC Recent Labs  Lab 08/21/20 1653 08/22/20 0101  WBC 8.2 8.1  HGB 12.6 12.6  HCT 39.9 40.0  PLT 209 204  MCV 78.9* 79.4*  MCH 24.9* 25.0*  MCHC 31.6 31.5  RDW 14.4 14.0  LYMPHSABS 1.7 0.9  MONOABS 1.2* 0.2  EOSABS 0.2 0.0  BASOSABS 0.0 0.0    Chemistries  Recent Labs  Lab 08/21/20 1653 08/22/20 0101  NA 135 135  K 3.6 4.1  CL 98 98  CO2 26 25  GLUCOSE 155* 238*  BUN 14 13  CREATININE 0.69 0.68  CALCIUM 9.1 9.0  MG  --  2.0  AST 24 22  ALT 41 37  ALKPHOS 86 92  BILITOT 0.4 0.7   ------------------------------------------------------------------------------------------------------------------ Recent Labs    08/21/20 1653  TRIG 366*    Lab Results  Component Value Date   HGBA1C 9.3 (H) 08/22/2020   ------------------------------------------------------------------------------------------------------------------ No results for input(s): TSH, T4TOTAL, T3FREE, THYROIDAB in the last 72 hours.  Invalid input(s):  FREET3 ------------------------------------------------------------------------------------------------------------------ Recent Labs    08/21/20 1653 08/22/20 0101  FERRITIN 65 62    Coagulation profile No results for input(s): INR, PROTIME in the last 168 hours.  Recent Labs    08/21/20 1653 08/22/20 0101  DDIMER 0.38 <0.27    Cardiac Enzymes No results for input(s): CKMB, TROPONINI, MYOGLOBIN in the last 168 hours.  Invalid input(s): CK ------------------------------------------------------------------------------------------------------------------    Component Value Date/Time   BNP 45.7 03/18/2020 1530   BNP 5.7 02/10/2013 1418    Micro Results Recent Results (from the past 240 hour(s))  SARS Coronavirus 2 by RT PCR (hospital order, performed in [facility] hospital lab) Nasopharyngeal Nasopharyngeal Swab     Status: Abnormal   Collection Time: 08/21/20  4:53 PM   Specimen: Nasopharyngeal Swab  Result Value Ref Range Status   SARS Coronavirus 2 POSITIVE (A) NEGATIVE Final    Comment: RESULT CALLED TO, READ BACK BY AND VERIFIED WITH: SAM COBLE RN  AT 4431 ON 08/21/2020 BY [email or web address] (NOTE) SARS-CoV-2 target nucleic acids are DETECTED  SARS-CoV-2 RNA is generally detectable in upper respiratory specimens  during the acute phase of infection.  Positive results are indicative  of the presence of the identified virus, but do not rule out bacterial infection or co-infection with other pathogens not detected by the test.  Clinical correlation with patient history and  other diagnostic information is necessary to determine patient infection status.  The expected result is negative.  Fact Sheet for Patients:   StrictlyIdeas.no   Fact Sheet for Healthcare Providers:   BankingDealers.co.za    This test is not yet approved or cleared by the [place] FDA and  has been authorized for detection and/or diagnosis of SARS-CoV-2  by FDA under an Emergency Use Authorization (EUA).  This EUA will remain in effect (meanin g this test can be used) for the duration of  the COVID-19 declaration under Section 564(b)(1) of the Act, 21 U.S.C. section 360-bbb-3(b)(1), unless the authorization is terminated or revoked sooner.  Performed at [facility], [address] [place], [place] [address]   Blood Culture (routine x 2)     Status: None (Preliminary result)   Collection Time: 08/21/20  5:00 PM   Specimen: BLOOD RIGHT HAND  Result Value Ref Range Status   Specimen Description   Final    BLOOD RIGHT HAND Performed at [facility], [address] [place], [place] [address]    Special Requests   Final    BOTTLES DRAWN AEROBIC AND ANAEROBIC Blood Culture adequate volume Performed at [facility], [address] [place], [place] [address]    Culture   Final    NO GROWTH < 12 HOURS Performed at [facility], [place] [address], [place], [place] [address]    Report Status PENDING  Incomplete  Blood Culture (routine x 2)     Status: None (Preliminary result)   Collection Time: 08/21/20  5:10 PM   Specimen: BLOOD  Result Value Ref Range Status   Specimen Description   Final    BLOOD RIGHT ANTECUBITAL Performed at [facility], [place] [address], [place], [place] [address]    Special Requests   Final    BOTTLES DRAWN AEROBIC AND ANAEROBIC Blood Culture adequate volume Performed at [facility], [address] [place], [place] [address]    Culture   Final    NO GROWTH < 12 HOURS Performed at [facility] [address], [place], [place] [address]    Report Status PENDING  Incomplete    Radiology Reports DG Chest Port 1 View  Result Date: 08/21/2020 CLINICAL DATA:  Shortness of breath, possible COVID exposure at recent funeral EXAM: PORTABLE CHEST 1 VIEW COMPARISON:  CT 03/18/2020, radiograph 03/18/2020 FINDINGS: Increased attenuation towards the lung  bases likely combination of patient body habitus and breast prostheses. Focal opacity seen in the right infrahilar lung. No other new airspace process is seen. Cardiomediastinal contours are stable. Healed appearance of several right-sided rib fractures. No acute osseous abnormality or suspicious osseous lesion. Prior cervical fusion is incompletely assessed on this exam. IMPRESSION: Focal right infrahilar opacity could reflect pneumonia or atelectasis though should consider follow-up imaging to ensure resolution. Electronically Signed   By: Lovena Le M.D.   On: 08/21/2020 16:52

## 2020-08-22 NOTE — Plan of Care (Signed)

## 2020-08-22 NOTE — Evaluation (Signed)
Physical Therapy Evaluation Patient Details Name: Destiny Ballard MRN: [id number] DOB: 09/02/1950 Today's Date: 08/22/2020   History of Present Illness  70 y.o. female with medical history significant for CAD s/p DES, COPD, T2DM, HTN, HLD, anxiety/depression, bil RTC repair, Cervical and lumbar surgeries, and OSA not using CPAP who presented to the ED 08/21/20 for evaluation of dyspnea. COVID+ Unvaccinated; attended funeral of nephew (died of COVID) and exposed to multiple +family members.  Clinical Impression   Pt admitted with above diagnosis. Patient independent with all activities PTA. Currently on room air with sats 93-98% during walking x 250 ft. Pt with incr WOB, however tends to walk faster than recommended.  Pt currently with functional limitations due to the deficits listed below (see PT Problem List). Pt will benefit from skilled PT to increase their independence and safety with mobility to allow discharge to the venue listed below.       Follow Up Recommendations No PT follow up    Equipment Recommendations  None recommended by PT    Recommendations for Other Services OT consult     Precautions / Restrictions Precautions Precautions: None      Mobility  Bed Mobility Overal bed mobility: Independent                  Transfers Overall transfer level: Independent                  Ambulation/Gait Ambulation/Gait assistance: Supervision Gait Distance (Feet): 250 Feet Assistive device: None Gait Pattern/deviations: WFL(Within Functional Limits) Gait velocity: faster than recommended for her pulmonary status   General Gait Details: states she can't breathe well if she walks too slowly; walks quickly and becomes SOB  Stairs            Wheelchair Mobility    Modified Rankin (Stroke Patients Only)       Balance Overall balance assessment: No apparent balance deficits (not formally assessed)                                            Pertinent Vitals/Pain Pain Assessment: No/denies pain    Home Living Family/patient expects to be discharged to:: Private residence Living Arrangements: Children Available Help at Discharge: Family;Available 24 hours/day Type of Home: House Home Access: Level entry     Home Layout: Two level;Able to live on main level with bedroom/bathroom Home Equipment: Wheelchair - manual;Wheelchair - power;Shower seat;Bedside commode Additional Comments: equipment was her husband's (deceased) and she's held onto it    Prior Function Level of Independence: Independent         Comments: drives, grocery shops     Hand Dominance   Dominant Hand: Left    Extremity/Trunk Assessment   Upper Extremity Assessment Upper Extremity Assessment: Defer to OT evaluation    Lower Extremity Assessment Lower Extremity Assessment: Overall WFL for tasks assessed    Cervical / Trunk Assessment Cervical / Trunk Assessment: Other exceptions Cervical / Trunk Exceptions: overweight  Communication   Communication: No difficulties  Cognition Arousal/Alertness: Awake/alert Behavior During Therapy: WFL for tasks assessed/performed Overall Cognitive Status: Within Functional Limits for tasks assessed                                        General Comments General comments (  skin integrity, edema, etc.): on RA sats 93-98%    Exercises Other Exercises Other Exercises: issued IS and instructed to perform 10x/hour; pt then on phone and did not return demonstrate   Assessment/Plan    PT Assessment Patient needs continued PT services  PT Problem List Decreased activity tolerance;Decreased mobility;Decreased knowledge of precautions;Cardiopulmonary status limiting activity;Obesity       PT Treatment Interventions Gait training;Functional mobility training;Therapeutic activities;Therapeutic exercise;Patient/family education    PT Goals (Current goals can be found in the Care  Plan section)  Acute Rehab PT Goals Patient Stated Goal: go home without oxygen PT Goal Formulation: With patient Time For Goal Achievement: 09/05/20 Potential to Achieve Goals: Good    Frequency Min 3X/week   Barriers to discharge        Co-evaluation               AM-PAC PT "6 Clicks" Mobility  Outcome Measure Help needed turning from your back to your side while in a flat bed without using bedrails?: None Help needed moving from lying on your back to sitting on the side of a flat bed without using bedrails?: None Help needed moving to and from a bed to a chair (including a wheelchair)?: A Little Help needed standing up from a chair using your arms (e.g., wheelchair or bedside chair)?: A Little Help needed to walk in hospital room?: A Little Help needed climbing 3-5 steps with a railing? : A Lot 6 Click Score: 19    End of Session   Activity Tolerance: Patient limited by fatigue Patient left: in chair;with call bell/phone within reach;with chair alarm set Nurse Communication: Mobility status;Other (comment) (needs flutter valve) PT Visit Diagnosis: Difficulty in walking, not elsewhere classified (R26.2)    Time: 4696-2952 PT Time Calculation (min) (ACUTE ONLY): 29 min   Charges:   PT Evaluation $PT Eval Low Complexity: 1 Low PT Treatments $Gait Training: 8-22 mins         Arby Barrette, PT Pager [phone]   Rexanne Mano 08/22/2020, 9:52 AM

## 2020-08-23 ENCOUNTER — Other Ambulatory Visit (HOSPITAL_COMMUNITY): Payer: Self-pay | Admitting: Internal Medicine

## 2020-08-23 DIAGNOSIS — E1159 Type 2 diabetes mellitus with other circulatory complications: Secondary | ICD-10-CM

## 2020-08-23 DIAGNOSIS — I152 Hypertension secondary to endocrine disorders: Secondary | ICD-10-CM

## 2020-08-23 DIAGNOSIS — I5032 Chronic diastolic (congestive) heart failure: Secondary | ICD-10-CM

## 2020-08-23 LAB — CBC WITH DIFFERENTIAL/PLATELET
Abs Immature Granulocytes: 0.07 10*3/uL (ref 0.00–0.07)
Basophils Absolute: 0 10*3/uL (ref 0.0–0.1)
Basophils Relative: 0 %
Eosinophils Absolute: 0 10*3/uL (ref 0.0–0.5)
Eosinophils Relative: 0 %
HCT: 39.9 % (ref 36.0–46.0)
Hemoglobin: 12.4 g/dL (ref 12.0–15.0)
Immature Granulocytes: 1 %
Lymphocytes Relative: 11 %
Lymphs Abs: 1.1 10*3/uL (ref 0.7–4.0)
MCH: 24.3 pg — ABNORMAL LOW (ref 26.0–34.0)
MCHC: 31.1 g/dL (ref 30.0–36.0)
MCV: 78.2 fL — ABNORMAL LOW (ref 80.0–100.0)
Monocytes Absolute: 0.7 10*3/uL (ref 0.1–1.0)
Monocytes Relative: 7 %
Neutro Abs: 7.9 10*3/uL — ABNORMAL HIGH (ref 1.7–7.7)
Neutrophils Relative %: 81 %
Platelets: 230 10*3/uL (ref 150–400)
RBC: 5.1 MIL/uL (ref 3.87–5.11)
RDW: 13.7 % (ref 11.5–15.5)
WBC: 9.7 10*3/uL (ref 4.0–10.5)
nRBC: 0 % (ref 0.0–0.2)

## 2020-08-23 LAB — COMPREHENSIVE METABOLIC PANEL
ALT: 28 U/L (ref 0–44)
AST: 16 U/L (ref 15–41)
Albumin: 3.2 g/dL — ABNORMAL LOW (ref 3.5–5.0)
Alkaline Phosphatase: 79 U/L (ref 38–126)
Anion gap: 11 (ref 5–15)
BUN: 31 mg/dL — ABNORMAL HIGH (ref 8–23)
CO2: 24 mmol/L (ref 22–32)
Calcium: 9.5 mg/dL (ref 8.9–10.3)
Chloride: 96 mmol/L — ABNORMAL LOW (ref 98–111)
Creatinine, Ser: 0.82 mg/dL (ref 0.44–1.00)
GFR, Estimated: >60 mL/min (ref >60)
Glucose, Bld: 321 mg/dL — ABNORMAL HIGH (ref 70–99)
Potassium: 4.9 mmol/L (ref 3.5–5.1)
Sodium: 131 mmol/L — ABNORMAL LOW (ref 135–145)
Total Bilirubin: 0.4 mg/dL (ref 0.3–1.2)
Total Protein: 7 g/dL (ref 6.5–8.1)

## 2020-08-23 LAB — GLUCOSE, CAPILLARY
Glucose-Capillary: 263 mg/dL — ABNORMAL HIGH (ref 70–99)
Glucose-Capillary: 318 mg/dL — ABNORMAL HIGH (ref 70–99)

## 2020-08-23 LAB — D-DIMER, QUANTITATIVE: D-Dimer, Quant: 0.38 ug/mL-FEU (ref 0.00–0.50)

## 2020-08-23 LAB — MAGNESIUM: Magnesium: 2.2 mg/dL (ref 1.7–2.4)

## 2020-08-23 LAB — FERRITIN: Ferritin: 68 ng/mL (ref 11–307)

## 2020-08-23 LAB — PHOSPHORUS: Phosphorus: 3.3 mg/dL (ref 2.5–4.6)

## 2020-08-23 LAB — C-REACTIVE PROTEIN: CRP: 2.6 mg/dL — ABNORMAL HIGH (ref <1.0)

## 2020-08-23 MED ORDER — DEXAMETHASONE 6 MG PO TABS: 6.0000 mg | ORAL_TABLET | Freq: Every day | ORAL | 0 refills | Status: DC

## 2020-08-23 MED ORDER — ALBUTEROL SULFATE HFA 108 (90 BASE) MCG/ACT IN AERS: 2.0000 | INHALATION_SPRAY | Freq: Three times a day (TID) | RESPIRATORY_TRACT | 0 refills | Status: DC | PRN

## 2020-08-23 MED ORDER — BENZONATATE 200 MG PO CAPS: 200.0000 mg | ORAL_CAPSULE | Freq: Three times a day (TID) | ORAL | 0 refills | Status: DC | PRN

## 2020-08-23 MED FILL — ALBUTEROL SULFATE HFA 108 (: 108 (90 BAS | 25 days supply | Qty: 18 | Fill #0

## 2020-08-23 MED FILL — BENZONATATE 200 MG CAPS: 200 | 6 days supply | Qty: 20 | Fill #0

## 2020-08-23 MED FILL — DEXAMETHASONE 6 MG TABLET: 6 | 5 days supply | Qty: 5 | Fill #0

## 2020-08-23 NOTE — Progress Notes (Signed)
Patient scheduled for outpatient Remdesivir infusions at 1:30pm on Thursday 2/10 and Friday 2/11 at [facility]. Please inform the patient to park at [place], as staff will be escorting the patient through the [place] entrance of the hospital. Appointments take approximately 45 minutes.    There is a wave flag banner located near the entrance on [address]. Turn into this entrance and immediately turn left or right and park in 1 of the 10 designated Covid Infusion Parking spots. There is a phone number on the sign, please call and let the staff know what spot you are in and we will come out and get you. For questions call [phone].  Thanks.

## 2020-08-23 NOTE — Evaluation (Signed)
Occupational Therapy Evaluation Patient Details Name: Destiny Ballard MRN: [id number] DOB: [birth year] Today's Date: 08/23/2020    History of Present Illness 70 y.o. female with medical history significant for CAD s/p DES, COPD, T2DM, HTN, HLD, anxiety/depression, bil RTC repair, Cervical and lumbar surgeries, and OSA not using CPAP who presented to the ED 08/21/20 for evaluation of dyspnea. COVID+ Unvaccinated; attended funeral of nephew (died of COVID) and exposed to multiple +family members.   Clinical Impression   Patient evaluated by Occupational Therapy with no further acute OT needs identified. All education has been completed and the patient has no further questions. Pt able to perform ADLs mod I.  Reviewed energy conservation strategies. `See below for any follow-up Occupational Therapy or equipment needs. OT is signing off. Thank you for this referral.      Follow Up Recommendations  No OT follow up;Supervision - Intermittent    Equipment Recommendations  None recommended by OT    Recommendations for Other Services       Precautions / Restrictions Precautions Precautions: None Restrictions Weight Bearing Restrictions: No      Mobility Bed Mobility Overal bed mobility: Independent                  Transfers Overall transfer level: Independent                    Balance Overall balance assessment: No apparent balance deficits (not formally assessed)                                         ADL either performed or assessed with clinical judgement   ADL Overall ADL's : Modified independent                                       General ADL Comments: Pt able to perform ADLs mod I with DOE 3/4.  Instructed pt to use shower seat at home, to pace self, and to take rest breaks. She verbalized understanding     Vision Baseline Vision/History: No visual deficits Patient Visual Report: No change from baseline        Perception     Praxis      Pertinent Vitals/Pain Pain Assessment: No/denies pain     Hand Dominance Left   Extremity/Trunk Assessment Upper Extremity Assessment Upper Extremity Assessment: Overall WFL for tasks assessed   Lower Extremity Assessment Lower Extremity Assessment: Overall WFL for tasks assessed   Cervical / Trunk Assessment Cervical / Trunk Assessment: Other exceptions Cervical / Trunk Exceptions: overweight   Communication Communication Communication: No difficulties   Cognition Arousal/Alertness: Awake/alert Behavior During Therapy: WFL for tasks assessed/performed Overall Cognitive Status: Within Functional Limits for tasks assessed                                     General Comments       Exercises     Shoulder Instructions      Home Living Family/patient expects to be discharged to:: Private residence Living Arrangements: Children Available Help at Discharge: Family;Available PRN/intermittently Type of Home: House Home Access: Level entry     Home Layout: Two level;Able to live on main level with bedroom/bathroom  Bathroom Shower/Tub: Occupational psychologist: Standard     Home Equipment: Wheelchair - Education officer, community - power;Shower seat;Bedside commode          Prior Functioning/Environment Level of Independence: Independent        Comments: drives, grocery shops        OT Problem List: Cardiopulmonary status limiting activity      OT Treatment/Interventions:      OT Goals(Current goals can be found in the care plan section) Acute Rehab OT Goals Patient Stated Goal: go home today OT Goal Formulation: All assessment and education complete, DC therapy  OT Frequency:     Barriers to D/C:            Co-evaluation              AM-PAC OT "6 Clicks" Daily Activity     Outcome Measure Help from another person eating meals?: None Help from another person taking care of personal  grooming?: None Help from another person toileting, which includes using toliet, bedpan, or urinal?: None Help from another person bathing (including washing, rinsing, drying)?: None Help from another person to put on and taking off regular upper body clothing?: None Help from another person to put on and taking off regular lower body clothing?: None 6 Click Score: 24   End of Session Nurse Communication: Mobility status  Activity Tolerance: Patient tolerated treatment well Patient left: in bed;with call bell/phone within reach  OT Visit Diagnosis: Unsteadiness on feet (R26.81)                Time: 1123-1140 OT Time Calculation (min): 17 min Charges:  OT General Charges $OT Visit: 1 Visit OT Evaluation $OT Eval Moderate Complexity: 1 Mod Kerrigan Gombos C., OTR/L Acute Rehabilitation Services Pager [phone] Office [phone]   Lucille Passy M 08/23/2020, 11:56 AM

## 2020-08-23 NOTE — Progress Notes (Signed)
Pt was taken down to main entrance via [facility] and picked up by car by family member. DC instructions given and explained to the patient.

## 2020-08-23 NOTE — Plan of Care (Signed)
Problem: Education: Goal: Knowledge of risk factors and measures for prevention of condition will improve 08/23/2020 1158 by Rolland Porter, RN Outcome: Adequate for Discharge 08/23/2020 1010 by Rolland Porter, RN Outcome: Adequate for Discharge   Problem: Coping: Goal: Psychosocial and spiritual needs will be supported 08/23/2020 1158 by Rolland Porter, RN Outcome: Adequate for Discharge 08/23/2020 1010 by Rolland Porter, RN Outcome: Adequate for Discharge   Problem: Respiratory: Goal: Will maintain a patent airway 08/23/2020 1158 by Rolland Porter, RN Outcome: Adequate for Discharge 08/23/2020 1010 by Rolland Porter, RN Outcome: Adequate for Discharge Goal: Complications related to the disease process, condition or treatment will be avoided or minimized 08/23/2020 1158 by Rolland Porter, RN Outcome: Adequate for Discharge 08/23/2020 1010 by Rolland Porter, RN Outcome: Adequate for Discharge   Problem: Education: Goal: Knowledge of General Education information will improve Description: Including pain rating scale, medication(s)/side effects and non-pharmacologic comfort measures 08/23/2020 1158 by Rolland Porter, RN Outcome: Adequate for Discharge 08/23/2020 1010 by Rolland Porter, RN Outcome: Adequate for Discharge   Problem: Health Behavior/Discharge Planning: Goal: Ability to manage health-related needs will improve 08/23/2020 1158 by Rolland Porter, RN Outcome: Adequate for Discharge 08/23/2020 1010 by Rolland Porter, RN Outcome: Adequate for Discharge   Problem: Clinical Measurements: Goal: Ability to maintain clinical measurements within normal limits will improve 08/23/2020 1158 by Rolland Porter, RN Outcome: Adequate for Discharge 08/23/2020 1010 by Rolland Porter, RN Outcome: Adequate for Discharge Goal: Will remain free from infection 08/23/2020 1158 by Rolland Porter, RN Outcome: Adequate for Discharge 08/23/2020 1010 by  Rolland Porter, RN Outcome: Adequate for Discharge Goal: Diagnostic test results will improve 08/23/2020 1158 by Rolland Porter, RN Outcome: Adequate for Discharge 08/23/2020 1010 by Rolland Porter, RN Outcome: Adequate for Discharge Goal: Respiratory complications will improve 08/23/2020 1158 by Rolland Porter, RN Outcome: Adequate for Discharge 08/23/2020 1010 by Rolland Porter, RN Outcome: Adequate for Discharge Goal: Cardiovascular complication will be avoided 08/23/2020 1158 by Rolland Porter, RN Outcome: Adequate for Discharge 08/23/2020 1010 by Rolland Porter, RN Outcome: Adequate for Discharge   Problem: Activity: Goal: Risk for activity intolerance will decrease 08/23/2020 1158 by Rolland Porter, RN Outcome: Adequate for Discharge 08/23/2020 1010 by Rolland Porter, RN Outcome: Adequate for Discharge   Problem: Nutrition: Goal: Adequate nutrition will be maintained 08/23/2020 1158 by Rolland Porter, RN Outcome: Adequate for Discharge 08/23/2020 1010 by Rolland Porter, RN Outcome: Adequate for Discharge   Problem: Coping: Goal: Level of anxiety will decrease 08/23/2020 1158 by Rolland Porter, RN Outcome: Adequate for Discharge 08/23/2020 1010 by Rolland Porter, RN Outcome: Adequate for Discharge   Problem: Elimination: Goal: Will not experience complications related to bowel motility 08/23/2020 1158 by Rolland Porter, RN Outcome: Adequate for Discharge 08/23/2020 1010 by Rolland Porter, RN Outcome: Adequate for Discharge Goal: Will not experience complications related to urinary retention 08/23/2020 1158 by Rolland Porter, RN Outcome: Adequate for Discharge 08/23/2020 1010 by Rolland Porter, RN Outcome: Adequate for Discharge   Problem: Pain Managment: Goal: General experience of comfort will improve 08/23/2020 1158 by Rolland Porter, RN Outcome: Adequate for Discharge 08/23/2020 1010 by Rolland Porter, RN Outcome:  Adequate for Discharge   Problem: Safety: Goal: Ability to remain free from injury will improve 08/23/2020 1158 by Rolland Porter, RN Outcome: Adequate for Discharge 08/23/2020 1010 by Rolland Porter, RN  Outcome: Adequate for Discharge   Problem: Skin Integrity: Goal: Risk for impaired skin integrity will decrease 08/23/2020 1158 by Rolland Porter, RN Outcome: Adequate for Discharge 08/23/2020 1010 by Rolland Porter, RN Outcome: Adequate for Discharge

## 2020-08-23 NOTE — Plan of Care (Signed)

## 2020-08-23 NOTE — Discharge Summary (Signed)
PATIENT DETAILS Name: Destiny Ballard Age: 70 y.o. Sex: female Date of Birth: 03-27-1951 MRN: [id number]. Admitting Physician: Jonetta Osgood, MD [id number], Gay Filler, MD  Admit Date: 08/21/2020 Discharge date: 08/23/2020  Recommendations for Outpatient Follow-up:  1. Follow up with PCP in 1-2 weeks 2. Please obtain CMP/CBC in one week 3. Repeat Chest Xray in 4-6 week 4. Follow blood cultures until final  Admitted From:  Home  Disposition: [place]: No  Equipment/Devices: None  Discharge Condition: Stable  CODE STATUS: FULL CODE  Diet recommendation:  Diet Order            Diet - low sodium heart healthy           Diet Carb Modified           Diet heart healthy/carb modified Room service appropriate? Yes; Fluid consistency: Thin  Diet effective now                  Brief Narrative: Patient is a 70 y.o. female with PMHx of CAD s/p PCI 2007, DM-2, HTN, HLD, COPD, OSA-noncompliant with CPAP-presenting with 3-day history of cough, shortness of breath-found to have acute hypoxic respiratory failure due to presumed COVID-19 pneumonia.    Note-patient recently had Covid exposure at funeral for her nephew   COVID-19 vaccinated status: Unvaccinated  Significant Events: 2/7>> Admit to [facility] for hypoxia-COVID-19 pneumonia  Significant studies: 2/7>> chest x-ray: Focal right hilar opacity  COVID-19 medications: Steroids: 2/7>> Remdesivir: 2/7>>  Antibiotics: None  Microbiology data: 2/7 >>blood culture: No growth  Procedures: None  Consults: None  Brief Hospital Course: Acute Hypoxic Resp Failure due to Covid 19 Viral pneumonia: Appears to have mild hypoxemia-titrated off oxygen on 2/8-remains on room air-clinically improved-some cough but otherwise much improved. Ambulating in the room without any assistance and without any major shortness of breath. Since overall improved-stable to be discharged on tapering oxygen-she will  complete her last 2 doses of Remdesivir in the infusion center. She was encouraged to get COVID-19 vaccine around 1 month after after she recovered from this acute illness.  COVID-19 Labs:  Recent Labs    08/21/20 1653 08/22/20 0101 08/23/20 0121  DDIMER 0.38 <0.27 0.38  FERRITIN 65 62 68  LDH 103  --   --   CRP 4.8* 4.6* 2.6*    Lab Results  Component Value Date   SARSCOV2NAA POSITIVE (A) 08/21/2020   SARSCOV2NAA Not Detected 08/11/2020   SARSCOV2NAA Not Detected 06/19/2020   [place] NEGATIVE 03/18/2020     CAD s/p DES to LAD in 2007: No anginal symptoms-continue aspirin/statin/beta-blocker  HTN: Controlled-continue losartan/Toprol/hydralazine and Lasix  HLD: Continue statin  DM-2 (A1c 9.3 on 2/8): CBG on the higher side-likely due to steroids-continue usual home regimen-follow with PCP for further optimization.  COPD: Stable-continue bronchodilators  OSA: Noncompliant to CPAP due to intolerance  Anxiety/depression: Stable-continue Prozac  Obesity: Estimated body mass index is 35.58 kg/m as calculated from the following:   Height as of this encounter: 4\' 11"  (1.499 m).   Weight as of this encounter: 79.9 kg.   Discharge Diagnoses:  Principal Problem:   Pneumonia due to COVID-19 virus Active Problems:   Depression with anxiety   CAD (coronary artery disease)   Hypertension associated with diabetes (HCC)   Chronic diastolic CHF (congestive heart failure) (HCC)   Type 2 diabetes mellitus with diabetic polyneuropathy, with long-term current use of insulin ([place])   Hyperlipidemia associated with type 2 diabetes mellitus ([place])  Discharge Instructions:    Person Under Monitoring Name: Destiny Ballard  Location: [place] [address]   Infection Prevention Recommendations for Individuals Confirmed to have, or Being Evaluated for, 2019 Novel Coronavirus (COVID-19) Infection Who Receive Care at Home  Individuals who are confirmed  to have, or are being evaluated for, COVID-19 should follow the prevention steps below until a healthcare provider or local or state health department says they can return to normal activities.  Stay home except to get medical care You should restrict activities outside your home, except for getting medical care. Do not go to work, school, or public areas, and do not use public transportation or taxis.  Call ahead before visiting your doctor Before your medical appointment, call the healthcare provider and tell them that you have, or are being evaluated for, COVID-19 infection. This will help the healthcare provider's office take steps to keep other people from getting infected. Ask your healthcare provider to call the local or state health department.  Monitor your symptoms Seek prompt medical attention if your illness is worsening (e.g., difficulty breathing). Before going to your medical appointment, call the healthcare provider and tell them that you have, or are being evaluated for, COVID-19 infection. Ask your healthcare provider to call the local or state health department.  Wear a facemask You should wear a facemask that covers your nose and mouth when you are in the same room with other people and when you visit a healthcare provider. People who live with or visit you should also wear a facemask while they are in the same room with you.  Separate yourself from other people in your home As much as possible, you should stay in a different room from other people in your home. Also, you should use a separate bathroom, if available.  Avoid sharing household items You should not share dishes, drinking glasses, cups, eating utensils, towels, bedding, or other items with other people in your home. After using these items, you should wash them thoroughly with soap and water.  Cover your coughs and sneezes Cover your mouth and nose with a tissue when you cough or sneeze, or you can  cough or sneeze into your sleeve. Throw used tissues in a lined trash can, and immediately wash your hands with soap and water for at least 20 seconds or use an alcohol-based hand rub.  Wash your [facility] your hands often and thoroughly with soap and water for at least 20 seconds. You can use an alcohol-based hand sanitizer if soap and water are not available and if your hands are not visibly dirty. Avoid touching your eyes, nose, and mouth with unwashed hands.   Prevention Steps for Caregivers and Household Members of Individuals Confirmed to have, or Being Evaluated for, COVID-19 Infection Being Cared for in the Home  If you live with, or provide care at home for, a person confirmed to have, or being evaluated for, COVID-19 infection please follow these guidelines to prevent infection:  Follow healthcare provider's instructions Make sure that you understand and can help the patient follow any healthcare provider instructions for all care.  Provide for the patient's basic needs You should help the patient with basic needs in the home and provide support for getting groceries, prescriptions, and other personal needs.  Monitor the patient's symptoms If they are getting sicker, call his or her medical provider and tell them that the patient has, or is being evaluated for, COVID-19 infection. This will help  the healthcare provider's office take steps to keep other people from getting infected. Ask the healthcare provider to call the local or state health department.  Limit the number of people who have contact with the patient  If possible, have only one caregiver for the patient.  Other household members should stay in another home or place of residence. If this is not possible, they should stay  in another room, or be separated from the patient as much as possible. Use a separate bathroom, if available.  Restrict visitors who do not have an essential need to be in the  home.  Keep older adults, very young children, and other sick people away from the patient Keep older adults, very young children, and those who have compromised immune systems or chronic health conditions away from the patient. This includes people with chronic heart, lung, or kidney conditions, diabetes, and cancer.  Ensure good ventilation Make sure that shared spaces in the home have good air flow, such as from an air conditioner or an opened window, weather permitting.  Wash your hands often  Wash your hands often and thoroughly with soap and water for at least 20 seconds. You can use an alcohol based hand sanitizer if soap and water are not available and if your hands are not visibly dirty.  Avoid touching your eyes, nose, and mouth with unwashed hands.  Use disposable paper towels to dry your hands. If not available, use dedicated cloth towels and replace them when they become wet.  Wear a facemask and gloves  Wear a disposable facemask at all times in the room and gloves when you touch or have contact with the patient's blood, body fluids, and/or secretions or excretions, such as sweat, saliva, sputum, nasal mucus, vomit, urine, or feces.  Ensure the mask fits over your nose and mouth tightly, and do not touch it during use.  Throw out disposable facemasks and gloves after using them. Do not reuse.  Wash your hands immediately after removing your facemask and gloves.  If your personal clothing becomes contaminated, carefully remove clothing and launder. Wash your hands after handling contaminated clothing.  Place all used disposable facemasks, gloves, and other waste in a lined container before disposing them with other household waste.  Remove gloves and wash your hands immediately after handling these items.  Do not share dishes, glasses, or other household items with the patient  Avoid sharing household items. You should not share dishes, drinking glasses, cups, eating  utensils, towels, bedding, or other items with a patient who is confirmed to have, or being evaluated for, COVID-19 infection.  After the person uses these items, you should wash them thoroughly with soap and water.  Wash laundry thoroughly  Immediately remove and wash clothes or bedding that have blood, body fluids, and/or secretions or excretions, such as sweat, saliva, sputum, nasal mucus, vomit, urine, or feces, on them.  Wear gloves when handling laundry from the patient.  Read and follow directions on labels of laundry or clothing items and detergent. In general, wash and dry with the warmest temperatures recommended on the label.  Clean all areas the individual has used often  Clean all touchable surfaces, such as counters, tabletops, doorknobs, bathroom fixtures, toilets, phones, keyboards, tablets, and bedside tables, every day. Also, clean any surfaces that may have blood, body fluids, and/or secretions or excretions on them.  Wear gloves when cleaning surfaces the patient has come in contact with.  Use a diluted bleach solution (e.g., dilute  bleach with 1 part bleach and 10 parts water) or a household disinfectant with a label that says EPA-registered for coronaviruses. To make a bleach solution at home, add 1 tablespoon of bleach to 1 quart (4 cups) of water. For a larger supply, add  cup of bleach to 1 gallon (16 cups) of water.  Read labels of cleaning products and follow recommendations provided on product labels. Labels contain instructions for safe and effective use of the cleaning product including precautions you should take when applying the product, such as wearing gloves or eye protection and making sure you have good ventilation during use of the product.  Remove gloves and wash hands immediately after cleaning.  Monitor yourself for signs and symptoms of illness Caregivers and household members are considered close contacts, should monitor their health, and will be  asked to limit movement outside of the home to the extent possible. Follow the monitoring steps for close contacts listed on the symptom monitoring form.   ? If you have additional questions, contact your local health department or call the epidemiologist on call at [phone] (available 24/7). ? This guidance is subject to change. For the most up-to-date guidance from [facility], please refer to their website: YouBlogs.pl    Activity:  As tolerated    Discharge Instructions    Call MD for:  difficulty breathing, headache or visual disturbances   Complete by: As directed    Diet - low sodium heart healthy   Complete by: As directed    Diet Carb Modified   Complete by: As directed    Discharge instructions   Complete by: As directed    1.)  21 days of isolation from the day of your first positive Covid test  2.)  If you develop worsening shortness of breath please seek immediate medical attention  3.)  Please ask your primary care practitioner to repeat a two-view chest x-ray in 4 to 6 weeks  4.)  Please ask your primary care practitioner to follow blood cultures until final-they are negative so far.  5.)You have been scheduled for outpatient Remdesivir infusions at 1:30pm on Thursday 2/10 and Friday 2/11 at [place], [place], as staff will be escorting the patient through the [place] entrance of the hospital.Appointments take approximately 45 minutes.   There is a wave flag banner located near the entrance on [address]. Turn into this entranceand immediatelyturn left or right and park in 1 of the 10 designated Covid Infusion Parking spots. There is a phone number on the sign, please call and let the staff know what spot you are in and we will come out and get you. For questions call [phone]   Increase activity slowly   Complete by: As directed      Allergies as of 08/23/2020       Reactions   Prednisone Other (See Comments)   REACTION: mood swings, nightmares. "Shot doesn't bother me, reaction is just with the pill" she states she has had the steroid injections before. From our records methylprednisone was given to her in 2013 without any complications.      Medication List    STOP taking these medications   methocarbamol 750 MG tablet Commonly known as: ROBAXIN   triamcinolone 0.1 % Commonly known as: KENALOG   Trulicity 4.91 PH/1.5AV Sopn Generic drug: Dulaglutide     TAKE these medications   albuterol 108 (90 Base) MCG/ACT inhaler Commonly known as: VENTOLIN HFA Inhale 2 puffs  into the lungs every 8 (eight) hours as needed for wheezing or shortness of breath.   aspirin EC 81 MG tablet Take 81 mg by mouth daily.   Basaglar KwikPen 100 UNIT/ML Inject 0.24 mLs (24 Units total) into the skin daily. What changed: how much to take   benzonatate 200 MG capsule Commonly known as: TESSALON Take 1 capsule (200 mg total) by mouth 3 (three) times daily as needed for cough. What changed:   when to take this  reasons to take this   calcium carbonate 500 MG chewable tablet Commonly known as: TUMS - dosed in mg elemental calcium Chew 2 tablets by mouth daily as needed for indigestion or heartburn.   cetirizine 10 MG tablet Commonly known as: ZYRTEC TAKE 1 TABLET BY MOUTH DAILY AS NEEDED FOR ALLERGIES What changed: See the new instructions.   dapagliflozin propanediol 10 MG Tabs tablet Commonly known as: FARXIGA Take 1 tablet (10 mg total) by mouth daily.   dexamethasone 6 MG tablet Commonly known as: DECADRON Take 1 tablet (6 mg total) by mouth daily for 5 days.   FLUoxetine 40 MG capsule Commonly known as: PROZAC TAKE 1 CAPSULE BY MOUTH EVERY DAY What changed: how much to take   fluticasone 50 MCG/ACT nasal spray Commonly known as: FLONASE SPRAY 2 SPRAYS INTO EACH NOSTRIL EVERY DAY What changed:   how much to take  how to take  this  when to take this  reasons to take this  additional instructions   furosemide 20 MG tablet Commonly known as: LASIX TAKE 1 TABLET BY MOUTH EVERY DAY   gabapentin 300 MG capsule Commonly known as: NEURONTIN TAKE 2 CAPSULES BY MOUTH TWICE A DAY   hydrALAZINE 25 MG tablet Commonly known as: APRESOLINE Patient needs to schedule appointment for any future refills.  Please call [phone].  1st attempt. What changed:   how much to take  how to take this  when to take this  additional instructions   hydrOXYzine 25 MG tablet Commonly known as: ATARAX/VISTARIL TAKE 1/2 TO 1 TABLET BY MOUTH EVERY 8 HOURS AS NEEDED FOR ITCHING What changed: See the new instructions.   Insulin Pen Needle 32G X 4 MM Misc 1 Device by Does not apply route as directed.   losartan 100 MG tablet Commonly known as: COZAAR Take 1 tablet (100 mg total) by mouth daily.   metFORMIN 500 MG 24 hr tablet Commonly known as: GLUCOPHAGE-XR Take 2 tablets (1,000 mg total) by mouth 2 (two) times daily.   metoprolol succinate 100 MG 24 hr tablet Commonly known as: TOPROL-XL TAKE 1 TABLET BY MOUTH EVERY DAY   montelukast 10 MG tablet Commonly known as: SINGULAIR TAKE 1 TABLET BY MOUTH EVERY DAY AS NEEDED FOR ALLERGIES What changed: See the new instructions.   nitroGLYCERIN 0.4 MG SL tablet Commonly known as: NITROSTAT Place 1 tablet (0.4 mg total) under the tongue every 5 (five) minutes as needed for chest pain.   REFRESH OP Place 2 drops into both eyes daily as needed (for dry eyes).   rosuvastatin 20 MG tablet Commonly known as: CRESTOR TAKE 1 TABLET BY MOUTH EVERY DAY What changed: when to take this   Spiriva Respimat 2.5 MCG/ACT Aers Generic drug: Tiotropium Bromide Monohydrate Inhale 2 puffs into the lungs daily. What changed:   when to take this  reasons to take this   traZODone 50 MG tablet Commonly known as: DESYREL TAKE 1/2 TO 1 TABLET BY MOUTH AT BEDTIME AS NEEDED FOR  SLEEP What changed:   reasons to take this  additional instructions   Vitamin D3 25 MCG (1000 UT) Caps Take 1,000 Units by mouth daily.       Follow-up Information    Cop[place], Gay Filler, MD. Schedule an appointment as soon as possible for a visit in 1 week(s).   Specialty: Family Medicine Contact information: [address] [place] [address] [phone]              Allergies  Allergen Reactions  . Prednisone Other (See Comments)    REACTION: mood swings, nightmares. "Shot doesn't bother me, reaction is just with the pill" she states she has had the steroid injections before. From our records methylprednisone was given to her in 2013 without any complications.     Other Procedures/Studies: DG Chest Port 1 View  Result Date: 08/21/2020 CLINICAL DATA:  Shortness of breath, possible COVID exposure at recent funeral EXAM: PORTABLE CHEST 1 VIEW COMPARISON:  CT 03/18/2020, radiograph 03/18/2020 FINDINGS: Increased attenuation towards the lung bases likely combination of patient body habitus and breast prostheses. Focal opacity seen in the right infrahilar lung. No other new airspace process is seen. Cardiomediastinal contours are stable. Healed appearance of several right-sided rib fractures. No acute osseous abnormality or suspicious osseous lesion. Prior cervical fusion is incompletely assessed on this exam. IMPRESSION: Focal right infrahilar opacity could reflect pneumonia or atelectasis though should consider follow-up imaging to ensure resolution. Electronically Signed   By: Lovena Le M.D.   On: 08/21/2020 16:52     TODAY-DAY OF DISCHARGE:  Subjective:   Destiny Ballard today has no headache,no chest abdominal pain,no new weakness tingling or numbness, feels much better wants to go home today.   Objective:   Blood pressure (!) 120/59, pulse 72, temperature 97.8 F (36.6 C), temperature source Oral, resp. rate 15, height 4\' 11"  (1.499 m), weight 79.9 kg, SpO2 95  %.  Intake/Output Summary (Last 24 hours) at 08/23/2020 1005 Last data filed at 08/23/2020 0900 Gross per 24 hour  Intake 250 ml  Output -  Net 250 ml   Filed Weights   08/21/20 1519 08/22/20 0600  Weight: 81.6 kg 79.9 kg    Exam: Awake Alert, Oriented *3, No new F.N deficits, Normal affect [place].AT,PERRAL Supple Neck,No JVD, No cervical lymphadenopathy appriciated.  Symmetrical Chest wall movement, Good air movement bilaterally, CTAB RRR,No Gallops,Rubs or new Murmurs, No Parasternal Heave +ve B.Sounds, Abd Soft, Non tender, No organomegaly appriciated, No rebound -guarding or rigidity. No Cyanosis, Clubbing or edema, No new Rash or bruise   PERTINENT RADIOLOGIC STUDIES: DG Chest Port 1 View  Result Date: 08/21/2020 CLINICAL DATA:  Shortness of breath, possible COVID exposure at recent funeral EXAM: PORTABLE CHEST 1 VIEW COMPARISON:  CT 03/18/2020, radiograph 03/18/2020 FINDINGS: Increased attenuation towards the lung bases likely combination of patient body habitus and breast prostheses. Focal opacity seen in the right infrahilar lung. No other new airspace process is seen. Cardiomediastinal contours are stable. Healed appearance of several right-sided rib fractures. No acute osseous abnormality or suspicious osseous lesion. Prior cervical fusion is incompletely assessed on this exam. IMPRESSION: Focal right infrahilar opacity could reflect pneumonia or atelectasis though should consider follow-up imaging to ensure resolution. Electronically Signed   By: Lovena Le M.D.   On: 08/21/2020 16:52     PERTINENT LAB RESULTS: CBC: Recent Labs    08/22/20 0101 08/23/20 0121  WBC 8.1 9.7  HGB 12.6 12.4  HCT 40.0 39.9  PLT 204 230  CMET CMP     Component Value Date/Time   NA 131 (L) 08/23/2020 0121   NA 137 01/04/2019 1514   K 4.9 08/23/2020 0121   CL 96 (L) 08/23/2020 0121   CO2 24 08/23/2020 0121   GLUCOSE 321 (H) 08/23/2020 0121   BUN 31 (H) 08/23/2020 0121   BUN 15  01/04/2019 1514   CREATININE 0.82 08/23/2020 0121   CREATININE 0.73 11/16/2019 1006   CREATININE 0.65 03/01/2016 1458   CALCIUM 9.5 08/23/2020 0121   PROT 7.0 08/23/2020 0121   PROT 6.9 05/19/2018 0939   ALBUMIN 3.2 (L) 08/23/2020 0121   ALBUMIN 4.2 05/19/2018 0939   AST 16 08/23/2020 0121   AST 14 (L) 11/16/2019 1006   ALT 28 08/23/2020 0121   ALT 14 11/16/2019 1006   ALKPHOS 79 08/23/2020 0121   BILITOT 0.4 08/23/2020 0121   BILITOT 0.3 11/16/2019 1006   GFRNONAA >60 08/23/2020 0121   GFRNONAA >60 11/16/2019 1006   GFRAA >60 03/18/2020 0655   GFRAA >60 11/16/2019 1006    GFR Estimated Creatinine Clearance: 57.5 mL/min (by C-G formula based on SCr of 0.82 mg/dL). No results for input(s): LIPASE, AMYLASE in the last 72 hours. No results for input(s): CKTOTAL, CKMB, CKMBINDEX, TROPONINI in the last 72 hours. Invalid input(s): POCBNP Recent Labs    08/22/20 0101 08/23/20 0121  DDIMER <0.27 0.38   Recent Labs    08/22/20 0102  HGBA1C 9.3*   Recent Labs    08/21/20 1653  TRIG 366*   No results for input(s): TSH, T4TOTAL, T3FREE, THYROIDAB in the last 72 hours.  Invalid input(s): FREET3 Recent Labs    08/22/20 0101 08/23/20 0121  FERRITIN 62 68   Coags: No results for input(s): INR in the last 72 hours.  Invalid input(s): PT Microbiology: Recent Results (from the past 240 hour(s))  SARS Coronavirus 2 by RT PCR (hospital order, performed in [facility] hospital lab) Nasopharyngeal Nasopharyngeal Swab     Status: Abnormal   Collection Time: 08/21/20  4:53 PM   Specimen: Nasopharyngeal Swab  Result Value Ref Range Status   SARS Coronavirus 2 POSITIVE (A) NEGATIVE Final    Comment: RESULT CALLED TO, READ BACK BY AND VERIFIED WITH: SAM COBLE RN AT 7564 ON 08/21/2020 BY [email or web address] (NOTE) SARS-CoV-2 target nucleic acids are DETECTED  SARS-CoV-2 RNA is generally detectable in upper respiratory specimens  during the acute phase of infection.  Positive results are  indicative  of the presence of the identified virus, but do not rule out bacterial infection or co-infection with other pathogens not detected by the test.  Clinical correlation with patient history and  other diagnostic information is necessary to determine patient infection status.  The expected result is negative.  Fact Sheet for Patients:   StrictlyIdeas.no   Fact Sheet for Healthcare Providers:   BankingDealers.co.za    This test is not yet approved or cleared by the [place] FDA and  has been authorized for detection and/or diagnosis of SARS-CoV-2 by FDA under an Emergency Use Authorization (EUA).  This EUA will remain in effect (meanin g this test can be used) for the duration of  the COVID-19 declaration under Section 564(b)(1) of the Act, 21 U.S.C. section 360-bbb-3(b)(1), unless the authorization is terminated or revoked sooner.  Performed at [facility], [address] [place], [place] [address]   Blood Culture (routine x 2)     Status: None (Preliminary result)   Collection Time: 08/21/20  5:00  PM   Specimen: BLOOD RIGHT HAND  Result Value Ref Range Status   Specimen Description   Final    BLOOD RIGHT HAND Performed at [facility], [place] [address] [place], [place] [address]    Special Requests   Final    BOTTLES DRAWN AEROBIC AND ANAEROBIC Blood Culture adequate volume Performed at [facility], [address] [place], [place] [address]    Culture   Final    NO GROWTH 2 DAYS Performed at [facility], [place] [address], [place], [place] [address]    Report Status PENDING  Incomplete  Blood Culture (routine x 2)     Status: None (Preliminary result)   Collection Time: 08/21/20  5:10 PM   Specimen: BLOOD  Result Value Ref Range Status   Specimen Description   Final    BLOOD RIGHT ANTECUBITAL Performed at [facility], [place] [address], [place], [place] [address]     Special Requests   Final    BOTTLES DRAWN AEROBIC AND ANAEROBIC Blood Culture adequate volume Performed at [facility], [address] [place], [place] [address]    Culture   Final    NO GROWTH 2 DAYS Performed at [facility], [place] [address] [place], [place] [address]    Report Status PENDING  Incomplete    FURTHER DISCHARGE INSTRUCTIONS:  Get Medicines reviewed and adjusted: Please take all your medications with you for your next visit with your Primary MD  Laboratory/radiological data: Please request your Primary MD to go over all hospital tests and procedure/radiological results at the follow up, please ask your Primary MD to get all Hospital records sent to his/her office.  In some cases, they will be blood work, cultures and biopsy results pending at the time of your discharge. Please request that your primary care M.D. goes through all the records of your hospital data and follows up on these results.  Also Note the following: If you experience worsening of your admission symptoms, develop shortness of breath, life threatening emergency, suicidal or homicidal thoughts you must seek medical attention immediately by calling 911 or calling your MD immediately  if symptoms less severe.  You must read complete instructions/literature along with all the possible adverse reactions/side effects for all the Medicines you take and that have been prescribed to you. Take any new Medicines after you have completely understood and accpet all the possible adverse reactions/side effects.   Do not drive when taking Pain medications or sleeping medications (Benzodaizepines)  Do not take more than prescribed Pain, Sleep and Anxiety Medications. It is not advisable to combine anxiety,sleep and pain medications without talking with your primary care practitioner  Special Instructions: If you have smoked or chewed Tobacco  in the last 2 yrs please stop smoking, stop any regular  Alcohol  and or any Recreational drug use.  Wear Seat belts while driving.  Please note: You were cared for by a hospitalist during your hospital stay. Once you are discharged, your primary care physician will handle any further medical issues. Please note that NO REFILLS for any discharge medications will be authorized once you are discharged, as it is imperative that you return to your primary care physician (or establish a relationship with a primary care physician if you do not have one) for your post hospital discharge needs so that they can reassess your need for medications and monitor your lab values.  Total  Time spent coordinating discharge including counseling, education and face to face time equals 35 minutes.  Signed: Sybol Morre 08/23/2020 10:05 AM

## 2020-08-23 NOTE — Discharge Instructions (Signed)
You are scheduled for an outpatient Remdesivir infusions at 1:30pm on Thursday 2/10 and Friday 2/11 at [facility] Please park at [place], as staff will be escorting you through the [place] entrance of the hospital. Appointments take approximately 45 minutes.    The address for the infusion clinic site is:  --GPS address is [place] - the parking is located near [employer] building where you will see  COVID19 Infusion feather banner marking the entrance to parking.   (see photos below)            --Enter into the 2nd entrance where the "wave, flag banner" is at the road. Turn into this 2nd entrance and immediately turn left to park in 1 of the 10 parking spots.   --Please stay in your car and call the desk for assistance inside [phone].   The day of your visit you should: [name] Get plenty of rest the night before and drink plenty of water . Eat a light meal/snack before coming and take your medications as prescribed  . Wear warm, comfortable clothes with a shirt that can roll-up over the elbow (will need IV start).  . Wear a mask  . Consider bringing some activity to help pass the time       Person Under Monitoring Name: Destiny Ballard  Location: [place] [address]   Infection Prevention Recommendations for Individuals Confirmed to have, or Being Evaluated for, 2019 Novel Coronavirus (COVID-19) Infection Who Receive Care at Home  Individuals who are confirmed to have, or are being evaluated for, COVID-19 should follow the prevention steps below until a healthcare provider or local or state health department says they can return to normal activities.  Stay home except to get medical care You should restrict activities outside your home, except for getting medical care. Do not go to work, school, or public areas, and do not use public transportation or taxis.  Call ahead before visiting your doctor Before your  medical appointment, call the healthcare provider and tell them that you have, or are being evaluated for, COVID-19 infection. This will help the healthcare provider's office take steps to keep other people from getting infected. Ask your healthcare provider to call the local or state health department.  Monitor your symptoms Seek prompt medical attention if your illness is worsening (e.g., difficulty breathing). Before going to your medical appointment, call the healthcare provider and tell them that you have, or are being evaluated for, COVID-19 infection. Ask your healthcare provider to call the local or state health department.  Wear a facemask You should wear a facemask that covers your nose and mouth when you are in the same room with other people and when you visit a healthcare provider. People who live with or visit you should also wear a facemask while they are in the same room with you.  Separate yourself from other people in your home As much as possible, you should stay in a different room from other people in your home. Also, you should use a separate bathroom, if available.  Avoid sharing household items You should not share dishes, drinking glasses, cups, eating utensils, towels, bedding, or other items with other people in your home. After using these items, you should wash them thoroughly with soap and water.  Cover your coughs and sneezes Cover your mouth and nose with a tissue when you cough or sneeze, or you can cough or sneeze  into your sleeve. Throw used tissues in a lined trash can, and immediately wash your hands with soap and water for at least 20 seconds or use an alcohol-based hand rub.  Wash your [facility] your hands often and thoroughly with soap and water for at least 20 seconds. You can use an alcohol-based hand sanitizer if soap and water are not available and if your hands are not visibly dirty. Avoid touching your eyes, nose, and mouth with unwashed  hands.   Prevention Steps for Caregivers and Household Members of Individuals Confirmed to have, or Being Evaluated for, COVID-19 Infection Being Cared for in the Home  If you live with, or provide care at home for, a person confirmed to have, or being evaluated for, COVID-19 infection please follow these guidelines to prevent infection:  Follow healthcare provider's instructions Make sure that you understand and can help the patient follow any healthcare provider instructions for all care.  Provide for the patient's basic needs You should help the patient with basic needs in the home and provide support for getting groceries, prescriptions, and other personal needs.  Monitor the patient's symptoms If they are getting sicker, call his or her medical provider and tell them that the patient has, or is being evaluated for, COVID-19 infection. This will help the healthcare provider's office take steps to keep other people from getting infected. Ask the healthcare provider to call the local or state health department.  Limit the number of people who have contact with the patient  If possible, have only one caregiver for the patient.  Other household members should stay in another home or place of residence. If this is not possible, they should stay  in another room, or be separated from the patient as much as possible. Use a separate bathroom, if available.  Restrict visitors who do not have an essential need to be in the home.  Keep older adults, very young children, and other sick people away from the patient Keep older adults, very young children, and those who have compromised immune systems or chronic health conditions away from the patient. This includes people with chronic heart, lung, or kidney conditions, diabetes, and cancer.  Ensure good ventilation Make sure that shared spaces in the home have good air flow, such as from an air conditioner or an opened window, weather  permitting.  Wash your hands often  Wash your hands often and thoroughly with soap and water for at least 20 seconds. You can use an alcohol based hand sanitizer if soap and water are not available and if your hands are not visibly dirty.  Avoid touching your eyes, nose, and mouth with unwashed hands.  Use disposable paper towels to dry your hands. If not available, use dedicated cloth towels and replace them when they become wet.  Wear a facemask and gloves  Wear a disposable facemask at all times in the room and gloves when you touch or have contact with the patient's blood, body fluids, and/or secretions or excretions, such as sweat, saliva, sputum, nasal mucus, vomit, urine, or feces.  Ensure the mask fits over your nose and mouth tightly, and do not touch it during use.  Throw out disposable facemasks and gloves after using them. Do not reuse.  Wash your hands immediately after removing your facemask and gloves.  If your personal clothing becomes contaminated, carefully remove clothing and launder. Wash your hands after handling contaminated clothing.  Place all used disposable facemasks, gloves, and other waste in  a lined container before disposing them with other household waste.  Remove gloves and wash your hands immediately after handling these items.  Do not share dishes, glasses, or other household items with the patient  Avoid sharing household items. You should not share dishes, drinking glasses, cups, eating utensils, towels, bedding, or other items with a patient who is confirmed to have, or being evaluated for, COVID-19 infection.  After the person uses these items, you should wash them thoroughly with soap and water.  Wash laundry thoroughly  Immediately remove and wash clothes or bedding that have blood, body fluids, and/or secretions or excretions, such as sweat, saliva, sputum, nasal mucus, vomit, urine, or feces, on them.  Wear gloves when handling laundry from  the patient.  Read and follow directions on labels of laundry or clothing items and detergent. In general, wash and dry with the warmest temperatures recommended on the label.  Clean all areas the individual has used often  Clean all touchable surfaces, such as counters, tabletops, doorknobs, bathroom fixtures, toilets, phones, keyboards, tablets, and bedside tables, every day. Also, clean any surfaces that may have blood, body fluids, and/or secretions or excretions on them.  Wear gloves when cleaning surfaces the patient has come in contact with.  Use a diluted bleach solution (e.g., dilute bleach with 1 part bleach and 10 parts water) or a household disinfectant with a label that says EPA-registered for coronaviruses. To make a bleach solution at home, add 1 tablespoon of bleach to 1 quart (4 cups) of water. For a larger supply, add  cup of bleach to 1 gallon (16 cups) of water.  Read labels of cleaning products and follow recommendations provided on product labels. Labels contain instructions for safe and effective use of the cleaning product including precautions you should take when applying the product, such as wearing gloves or eye protection and making sure you have good ventilation during use of the product.  Remove gloves and wash hands immediately after cleaning.  Monitor yourself for signs and symptoms of illness Caregivers and household members are considered close contacts, should monitor their health, and will be asked to limit movement outside of the home to the extent possible. Follow the monitoring steps for close contacts listed on the symptom monitoring form.   ? If you have additional questions, contact your local health department or call the epidemiologist on call at [phone] (available 24/7). ? This guidance is subject to change. For the most up-to-date guidance from [facility], please refer to their  website: [email or web address]

## 2020-08-24 ENCOUNTER — Ambulatory Visit (HOSPITAL_COMMUNITY)
Admit: 2020-08-24 | Discharge: 2020-08-24 | Disposition: A | Discharge status: Home / Self Care | Payer: Medicare Other | Source: Ambulatory Visit | Attending: Pulmonary Disease | Admitting: Pulmonary Disease

## 2020-08-24 DIAGNOSIS — J1282 Pneumonia due to coronavirus disease 2019: Secondary | ICD-10-CM | POA: Diagnosis not present

## 2020-08-24 DIAGNOSIS — U071 COVID-19: Secondary | ICD-10-CM | POA: Diagnosis not present

## 2020-08-24 MED ORDER — EPINEPHRINE 0.3 MG/0.3ML IJ SOAJ: 0.3000 mg | Freq: Once | INTRAMUSCULAR | Status: DC | PRN

## 2020-08-24 MED ORDER — DIPHENHYDRAMINE HCL 50 MG/ML IJ SOLN: 50.0000 mg | Freq: Once | INTRAMUSCULAR | Status: DC | PRN

## 2020-08-24 MED ORDER — SODIUM CHLORIDE 0.9 % IV SOLN: INTRAVENOUS | Status: DC | PRN

## 2020-08-24 MED ORDER — FAMOTIDINE IN NACL 20-0.9 MG/50ML-% IV SOLN: 20.0000 mg | Freq: Once | INTRAVENOUS | Status: DC | PRN

## 2020-08-24 MED ORDER — ALBUTEROL SULFATE HFA 108 (90 BASE) MCG/ACT IN AERS: 2.0000 | INHALATION_SPRAY | Freq: Once | RESPIRATORY_TRACT | Status: DC | PRN

## 2020-08-24 MED ORDER — METHYLPREDNISOLONE SODIUM SUCC 125 MG IJ SOLR: 125.0000 mg | Freq: Once | INTRAMUSCULAR | Status: DC | PRN

## 2020-08-24 MED ORDER — SODIUM CHLORIDE 0.9 % IV SOLN
100.0000 mg | Freq: Once | INTRAVENOUS | Status: AC
  Administered 2020-08-24: 100 mg via INTRAVENOUS

## 2020-08-24 MED ORDER — SODIUM CHLORIDE 0.9 % IV SOLN: 100.0000 mg | Freq: Once | INTRAVENOUS | Status: DC

## 2020-08-24 NOTE — Discharge Instructions (Signed)
10 Things You Can Do to Manage Your COVID-19 Symptoms at Home °If you have possible or confirmed COVID-19: °1. Stay home except to get medical care. °2. Monitor your symptoms carefully. If your symptoms get worse, call your healthcare provider immediately. °3. Get rest and stay hydrated. °4. If you have a medical appointment, call the healthcare provider ahead of time and tell them that you have or may have COVID-19. °5. For medical emergencies, call 911 and notify the dispatch personnel that you have or may have COVID-19. °6. Cover your cough and sneezes with a tissue or use the inside of your elbow. °7. Wash your hands often with soap and water for at least 20 seconds or clean your hands with an alcohol-based hand sanitizer that contains at least 60% alcohol. °8. As much as possible, stay in a specific room and away from other people in your home. Also, you should use a separate bathroom, if available. If you need to be around other people in or outside of the home, wear a mask. °9. Avoid sharing personal items with other people in your household, like dishes, towels, and bedding. °10. Clean all surfaces that are touched often, like counters, tabletops, and doorknobs. Use household cleaning sprays or wipes according to the label instructions. °cdc.gov/coronavirus °01/28/2020 °This information is not intended to replace advice given to you by your health care provider. Make sure you discuss any questions you have with your health care provider. °Document Revised: 05/15/2020 Document Reviewed: 05/15/2020 °Elsevier Patient Education © 2021 Elsevier Inc. °If you have any questions or concerns after the infusion please call the Advanced Practice Provider on call at [phone]. This number is ONLY intended for your use regarding questions or concerns about the infusion post-treatment side-effects.  Please do not provide this number to others for use. For return to work notes please contact your primary care provider.   ° °If someone you know is interested in receiving treatment please have them contact their MD for a referral or visit [email or web address] ° ° ° °

## 2020-08-24 NOTE — Progress Notes (Signed)
Patient reviewed Fact Sheet for Patients, Parents, and Caregivers for Emergency Use Authorization (EUA) of remdesivir for the Treatment of Coronavirus. Patient also reviewed and is agreeable to the estimated cost of treatment. Patient is agreeable to proceed.    

## 2020-08-25 ENCOUNTER — Ambulatory Visit (HOSPITAL_COMMUNITY)
Admit: 2020-08-25 | Discharge: 2020-08-25 | Disposition: A | Discharge status: Home / Self Care | Payer: Medicare Other | Attending: Pulmonary Disease | Admitting: Pulmonary Disease

## 2020-08-25 ENCOUNTER — Telehealth: Payer: Self-pay

## 2020-08-25 DIAGNOSIS — J1282 Pneumonia due to coronavirus disease 2019: Secondary | ICD-10-CM | POA: Diagnosis not present

## 2020-08-25 DIAGNOSIS — U071 COVID-19: Secondary | ICD-10-CM | POA: Diagnosis not present

## 2020-08-25 MED ORDER — METHYLPREDNISOLONE SODIUM SUCC 125 MG IJ SOLR: 125.0000 mg | Freq: Once | INTRAMUSCULAR | Status: DC | PRN

## 2020-08-25 MED ORDER — EPINEPHRINE 0.3 MG/0.3ML IJ SOAJ: 0.3000 mg | Freq: Once | INTRAMUSCULAR | Status: DC | PRN

## 2020-08-25 MED ORDER — SODIUM CHLORIDE 0.9 % IV SOLN
100.0000 mg | Freq: Once | INTRAVENOUS | Status: AC
  Administered 2020-08-25: 100 mg via INTRAVENOUS

## 2020-08-25 MED ORDER — FAMOTIDINE IN NACL 20-0.9 MG/50ML-% IV SOLN: 20.0000 mg | Freq: Once | INTRAVENOUS | Status: DC | PRN

## 2020-08-25 MED ORDER — SODIUM CHLORIDE 0.9 % IV SOLN: INTRAVENOUS | Status: DC | PRN

## 2020-08-25 MED ORDER — ALBUTEROL SULFATE HFA 108 (90 BASE) MCG/ACT IN AERS: 2.0000 | INHALATION_SPRAY | Freq: Once | RESPIRATORY_TRACT | Status: DC | PRN

## 2020-08-25 MED ORDER — DIPHENHYDRAMINE HCL 50 MG/ML IJ SOLN: 50.0000 mg | Freq: Once | INTRAMUSCULAR | Status: DC | PRN

## 2020-08-25 NOTE — Telephone Encounter (Signed)
Transition Care Management Unsuccessful Follow-up Telephone Call  Date of discharge and from where:  08/23/20-  Attempts:  2nd Attempt  Reason for unsuccessful TCM follow-up call:  No answer/busy

## 2020-08-25 NOTE — Telephone Encounter (Signed)
Transition Care Management Unsuccessful Follow-up Telephone Call  Date of discharge and from where:  08/23/20-[place]  Attempts:  3rd Attempt  Reason for unsuccessful TCM follow-up call:  No answer/busy

## 2020-08-25 NOTE — Discharge Instructions (Signed)
10 Things You Can Do to Manage Your COVID-19 Symptoms at Home °If you have possible or confirmed COVID-19: °1. Stay home except to get medical care. °2. Monitor your symptoms carefully. If your symptoms get worse, call your healthcare provider immediately. °3. Get rest and stay hydrated. °4. If you have a medical appointment, call the healthcare provider ahead of time and tell them that you have or may have COVID-19. °5. For medical emergencies, call 911 and notify the dispatch personnel that you have or may have COVID-19. °6. Cover your cough and sneezes with a tissue or use the inside of your elbow. °7. Wash your hands often with soap and water for at least 20 seconds or clean your hands with an alcohol-based hand sanitizer that contains at least 60% alcohol. °8. As much as possible, stay in a specific room and away from other people in your home. Also, you should use a separate bathroom, if available. If you need to be around other people in or outside of the home, wear a mask. °9. Avoid sharing personal items with other people in your household, like dishes, towels, and bedding. °10. Clean all surfaces that are touched often, like counters, tabletops, and doorknobs. Use household cleaning sprays or wipes according to the label instructions. °cdc.gov/coronavirus °01/28/2020 °This information is not intended to replace advice given to you by your health care provider. Make sure you discuss any questions you have with your health care provider. °Document Revised: 05/15/2020 Document Reviewed: 05/15/2020 °Elsevier Patient Education © 2021 Elsevier Inc. °If you have any questions or concerns after the infusion please call the Advanced Practice Provider on call at [phone]. This number is ONLY intended for your use regarding questions or concerns about the infusion post-treatment side-effects.  Please do not provide this number to others for use. For return to work notes please contact your primary care provider.   ° °If someone you know is interested in receiving treatment please have them contact their MD for a referral or visit [email or web address] ° ° ° °

## 2020-08-25 NOTE — Progress Notes (Signed)
  Diagnosis: COVID-19  Physician: Dr. Asencion Noble  Procedure: Covid Infusion Clinic Med: remdesivir infusion - Provided patient with remdesivir fact sheet for patients, parents and caregivers prior to infusion.  Complications: No immediate complications noted.  Discharge: Discharged home   Destiny Ballard 08/25/2020

## 2020-08-25 NOTE — Progress Notes (Signed)
Patient reviewed Fact Sheet for Patients, Parents, and Caregivers for Emergency Use Authorization (EUA) of remdesivir for the Treatment of Coronavirus. Patient also reviewed and is agreeable to the estimated cost of treatment. Patient is agreeable to proceed.    

## 2020-08-25 NOTE — Progress Notes (Signed)
Destiny Ballard at [facility] [address] [place], [place] [address] [phone]  Date:  08/28/2020   Name:  Destiny Ballard   DOB:  1950/10/30   MRN:  [id number]  PCP:  Darreld Mclean, MD    Chief Complaint: No chief complaint on file.   History of Present Illness:  Destiny Ballard is a 70 y.o. very pleasant female patient who presents with the following:  Video visit today to discus recent hospital admission for COVID-19- Pt with history of CAD, HTN, CHF, OSA, COPD, DM, GERD, HLD Pt last seen by myself in April   Pt location is home, provider location is office The patient myself are present on video call today Pt ID confirmed with 2 factors, she gives consent for virtual visit today   Admit Date: 08/21/2020 Discharge date: 08/23/2020  Recommendations for Outpatient Follow-up:  1. Follow up with PCP in 1-2 weeks 2. Please obtain CMP/CBC in one week 3. Repeat Chest Xray in 4-6 week 4. Follow blood cultures until final  Brief Narrative: Patient is a19 y.o.femalewith PMHx ofCAD s/p PCI 2007, DM-2, HTN, HLD, COPD, OSA-noncompliant with CPAP-presenting with 3-day history of cough, shortness of breath-found to have acute hypoxic respiratory failure due to presumed COVID-19 pneumonia.  Note-patient recently had Covid exposure at funeral for her nephew  COVID-19 vaccinated status: Unvaccinated Significant Events: 2/7>> Admit to [facility] forhypoxia-COVID-19 pneumonia Significant studies: 2/7>>chest x-ray: Focal right hilar opacity COVID-19 medications: Steroids:2/7>> Remdesivir:2/7>> Antibiotics: None Microbiology data: 2/7>>blood culture:No growth   Brief Hospital Course: Acute Hypoxic Resp Failure due to Covid 19 Viral pneumonia:Appears to have mild hypoxemia-titrated off oxygen on 2/8-remains on room air-clinically improved-some cough but otherwise much improved. Ambulating in the room without any assistance  and without any major shortness of breath. Since overall improved-stable to be discharged on tapering oxygen-she will complete her last 2 doses of Remdesivir in the infusion center. She was encouraged to get COVID-19 vaccine around 1 month after after she recovered from this acute illness.  COVID-19 Labs:  Recent Labs (last 2 labs)        Recent Labs    08/21/20 1653 08/22/20 0101 08/23/20 0121  DDIMER 0.38 <0.27 0.38  FERRITIN 65 62 68  LDH 103  --   --   CRP 4.8* 4.6* 2.6*      Recent Labs       Lab Results  Component Value Date   SARSCOV2NAA POSITIVE (A) 08/21/2020   [place] Not Detected 08/11/2020   SARSCOV2NAA Not Detected 06/19/2020   [place] NEGATIVE 03/18/2020       CAD s/p DES to LAD in 2007: No anginal symptoms-continue aspirin/statin/beta-blocker HTN: Controlled-continue losartan/Toprol/hydralazine and Lasix JAS:[id number] statin DM-2 (A1c 9.3 on 2/8):CBG on the higher side-likely due to steroids-continue usual home regimen-follow with PCP for further optimization. COPD: Stable-continue bronchodilators OSA: Noncompliant to CPAP due to intolerance Anxiety/depression: Stable-continue Prozac Discharge Diagnoses:  Principal Problem:   Pneumonia due to COVID-19 virus Active Problems:   Depression with anxiety   CAD (coronary artery disease)   Hypertension associated with diabetes (HCC)   Chronic diastolic CHF (congestive heart failure) (HCC)   Type 2 diabetes mellitus with diabetic polyneuropathy, with long-term current use of insulin (HCC)   Hyperlipidemia associated with type 2 diabetes mellitus-----------------------------------------   She was not sent home with oxygen-was able to titrate off prior to discharge She notes that she still feels congested in her chest She is finishing up  pred today- she notes that her glucose has been running high- may be close to 400 at times This am she was 130 however.  Her last dose of prednisone is  tomorrow She does feel like she is improving  She is not checking vital signs, does not have an oxygen saturation meter   Lab Results  Component Value Date   HGBA1C 9.3 (H) 08/22/2020      Patient Active Problem List   Diagnosis Date Noted  . Pneumonia due to COVID-19 virus 08/21/2020  . Hyperlipidemia associated with type 2 diabetes mellitus ([place]) 08/21/2020  . Uncontrolled type 2 diabetes mellitus with complication, with long-term current use of insulin ([place]) 03/14/2020  . Type 2 diabetes mellitus with hyperglycemia, with long-term current use of insulin ([place]) 12/10/2019  . Type 2 diabetes mellitus with diabetic polyneuropathy, with long-term current use of insulin ([place]) 12/10/2019  . Obesity (BMI 30-39.9) 08/24/2018  . Depression with anxiety 08/24/2018  . CAD (coronary artery disease) 08/24/2018  . Abnormal cardiovascular stress test 08/24/2018  . Lobar pneumonia, unspecified organism ([place]) 11/13/2017  . Precordial chest pain   . Sepsis ([place]) 09/18/2017  . Chronic diastolic CHF (congestive heart failure) ([place]) 09/18/2017  . Diabetes mellitus ([place]) 09/08/2017  . Left hip pain 10/28/2016  . Chronic rhinitis 08/07/2016  . COPD with acute exacerbation ([place]) 04/25/2016  . Hypertension associated with diabetes ([place]) 01/30/2016  . Hypokalemia 01/27/2016  . HLD (hyperlipidemia)   . Type 2 diabetes mellitus without complication, without long-term current use of insulin ([place])   . OSA (obstructive sleep apnea) 05/06/2014  . Dyspnea on exertion 09/02/2012  . Chest pain 12/10/2011  . Chronic cough 05/11/2010  . GERD 06/28/2008    Past Medical History:  Diagnosis Date  . Anxiety    Prior suicide attempt  . CAD (coronary artery disease)    a) s/p DES to LAD 07/2005 b) Last Myoview low risk 11/2011 showing small fixed apical perfusion defect (prior MI vs attenuation) but no ischemia - normal EF.  [name] Cervical spondylosis   . Coronary atherosclerosis 06/28/2008  . Depression   .  Depression with anxiety   . Diabetes mellitus without complication ([place])   . GERD (gastroesophageal reflux disease)   . Hyperlipidemia   . Hypertension   . Insulin resistance   . Iron deficiency anemia   . Obesity     Past Surgical History:  Procedure Laterality Date  . BREAST ENHANCEMENT SURGERY    . CARDIAC CATHETERIZATION  06/17/2007   NORMAL. EF 60%  . CARDIAC CATHETERIZATION N/A 01/29/2016   Procedure: Left Heart Cath and Coronary Angiography;  Surgeon: Sherren Mocha, MD;  Location: [facility];  Service: Cardiovascular;  Laterality: N/A;  . CERVICAL SPONDYLOSIS     SINGLE LEVEL FUSION  . CHILDBIRTH     X3  . CORONARY STENT PLACEMENT  07/2005   LEFT ANTERIOR DESCENDING  . FOREARM FRACTURE SURGERY  2010   hand and shoulder   . INCISION AND DRAINAGE BREAST ABSCESS  01/05/2012      . INCISION AND DRAINAGE PERIRECTAL ABSCESS N/A 02/18/2014   Procedure: IRRIGATION AND DEBRIDEMENT PERIRECTAL ABSCESS;  Surgeon: Pedro Earls, MD;  Location: [facility];  Service: General;  Laterality: N/A;  . LEFT HEART CATH AND CORONARY ANGIOGRAPHY N/A 10/10/2017   Procedure: LEFT HEART CATH AND CORONARY ANGIOGRAPHY;  Surgeon: Burnell Blanks, MD;  Location: [facility];  Service: Cardiovascular;  Laterality: N/A;  . LUMBAR LAMINECTOMY    .  ROTATOR CUFF REPAIR     [place]  . TONSILLECTOMY AND ADENOIDECTOMY    . TUBAL LIGATION    . VIDEO BRONCHOSCOPY Bilateral 08/13/2016   Procedure: VIDEO BRONCHOSCOPY WITHOUT FLUORO;  Surgeon: Collene Gobble, MD;  Location: [facility];  Service: Cardiopulmonary;  Laterality: Bilateral;    Social History   Tobacco Use  . Smoking status: Passive Smoke Exposure - Never Smoker  . Smokeless tobacco: Never Used  Vaping Use  . Vaping Use: Never used  Substance Use Topics  . Alcohol use: Yes    Comment: occ  . Drug use: No    Family History  Problem Relation Age of Onset  . Heart attack Mother   . Diabetes Mother   . Lung cancer  Mother   . Asthma Mother   . Heart disease Mother   . Suicidality Father        "killed himself"  . Asthma Daughter        x 2  . Cancer Daughter        pre-cancerous polyp  . Diabetes Sister   . Cancer Sister   . Cervical cancer Daughter        cervical   . Allergies Other        all family--seasonal allergies    Allergies  Allergen Reactions  . Prednisone Other (See Comments)    REACTION: mood swings, nightmares. "Shot doesn't bother me, reaction is just with the pill" she states she has had the steroid injections before. From our records methylprednisone was given to her in 2013 without any complications.    Medication list has been reviewed and updated.  Current Outpatient Medications on File Prior to Visit  Medication Sig Dispense Refill  . albuterol (VENTOLIN HFA) 108 (90 Base) MCG/ACT inhaler Inhale 2 puffs into the lungs every 8 (eight) hours as needed for wheezing or shortness of breath. 18 each 0  . aspirin EC 81 MG tablet Take 81 mg by mouth daily.    . benzonatate (TESSALON) 200 MG capsule Take 1 capsule (200 mg total) by mouth 3 (three) times daily as needed for cough. 20 capsule 0  . calcium carbonate (TUMS - DOSED IN MG ELEMENTAL CALCIUM) 500 MG chewable tablet Chew 2 tablets by mouth daily as needed for indigestion or heartburn.    . cetirizine (ZYRTEC) 10 MG tablet TAKE 1 TABLET BY MOUTH DAILY AS NEEDED FOR ALLERGIES (Patient taking differently: Take 10 mg by mouth daily as needed for allergies.) 90 tablet 1  . Cholecalciferol (VITAMIN D3) 1000 UNITS CAPS Take 1,000 Units by mouth daily.    . dapagliflozin propanediol (FARXIGA) 10 MG TABS tablet Take 1 tablet (10 mg total) by mouth daily. 90 tablet 3  . dexamethasone (DECADRON) 6 MG tablet Take 1 tablet (6 mg total) by mouth daily for 5 days. 5 tablet 0  . FLUoxetine (PROZAC) 40 MG capsule TAKE 1 CAPSULE BY MOUTH EVERY DAY (Patient taking differently: Take 40 mg by mouth daily.) 90 capsule 3  . fluticasone  (FLONASE) 50 MCG/ACT nasal spray SPRAY 2 SPRAYS INTO EACH NOSTRIL EVERY DAY (Patient taking differently: Place 2 sprays into both nostrils daily as needed for allergies.) 48 g 0  . furosemide (LASIX) 20 MG tablet TAKE 1 TABLET BY MOUTH EVERY DAY (Patient taking differently: Take 20 mg by mouth daily.) 90 tablet 2  . gabapentin (NEURONTIN) 300 MG capsule TAKE 2 CAPSULES BY MOUTH TWICE A DAY (Patient not taking: No sig reported) 360 capsule 1  .  hydrALAZINE (APRESOLINE) 25 MG tablet Patient needs to schedule appointment for any future refills.  Please call [phone].  1st attempt. (Patient taking differently: Take 25 mg by mouth 2 (two) times daily.) 336 tablet 0  . hydrOXYzine (ATARAX/VISTARIL) 25 MG tablet TAKE 1/2 TO 1 TABLET BY MOUTH EVERY 8 HOURS AS NEEDED FOR ITCHING (Patient taking differently: Take 12.5-25 mg by mouth 2 (two) times daily.) 270 tablet 3  . Insulin Glargine (BASAGLAR KWIKPEN) 100 UNIT/ML Inject 0.24 mLs (24 Units total) into the skin daily. (Patient taking differently: Inject 28 Units into the skin daily.) 15 mL 11  . Insulin Pen Needle 32G X 4 MM MISC 1 Device by Does not apply route as directed. 100 each 11  . losartan (COZAAR) 100 MG tablet Take 1 tablet (100 mg total) by mouth daily. 90 tablet 3  . metFORMIN (GLUCOPHAGE-XR) 500 MG 24 hr tablet Take 2 tablets (1,000 mg total) by mouth 2 (two) times daily. 360 tablet 0  . metoprolol succinate (TOPROL-XL) 100 MG 24 hr tablet TAKE 1 TABLET BY MOUTH EVERY DAY (Patient taking differently: Take 100 mg by mouth daily.) 90 tablet 3  . montelukast (SINGULAIR) 10 MG tablet TAKE 1 TABLET BY MOUTH EVERY DAY AS NEEDED FOR ALLERGIES (Patient taking differently: Take 10 mg by mouth at bedtime.) 90 tablet 1  . nitroGLYCERIN (NITROSTAT) 0.4 MG SL tablet Place 1 tablet (0.4 mg total) under the tongue every 5 (five) minutes as needed for chest pain. 20 tablet 3  . Polyvinyl Alcohol-Povidone (REFRESH OP) Place 2 drops into both eyes daily as  needed (for dry eyes).    . rosuvastatin (CRESTOR) 20 MG tablet TAKE 1 TABLET BY MOUTH EVERY DAY (Patient taking differently: Take 20 mg by mouth every evening.) 90 tablet 3  . Tiotropium Bromide Monohydrate (SPIRIVA RESPIMAT) 2.5 MCG/ACT AERS Inhale 2 puffs into the lungs daily. (Patient taking differently: Inhale 2 puffs into the lungs daily as needed (shortness of breath).) 4 g 3  . traZODone (DESYREL) 50 MG tablet TAKE 1/2 TO 1 TABLET BY MOUTH AT BEDTIME AS NEEDED FOR SLEEP (Patient taking differently: Take 25-50 mg by mouth at bedtime as needed for sleep.) 90 tablet 1   Current Facility-Administered Medications on File Prior to Visit  Medication Dose Route Frequency Provider Last Rate Last Admin  . 0.9 %  sodium chloride infusion   Intravenous PRN Ghimire, Henreitta Leber, MD      . albuterol (VENTOLIN HFA) 108 (90 Base) MCG/ACT inhaler 2 puff  2 puff Inhalation Once PRN Ghimire, Henreitta Leber, MD      . diphenhydrAMINE (BENADRYL) injection 50 mg  50 mg Intravenous Once PRN Ghimire, Henreitta Leber, MD      . EPINEPHrine (EPI-PEN) injection 0.3 mg  0.3 mg Intramuscular Once PRN Jonetta Osgood, MD      . famotidine (PEPCID) IVPB 20 mg premix  20 mg Intravenous Once PRN Jonetta Osgood, MD      . methylPREDNISolone sodium succinate (SOLU-MEDROL) 125 mg/2 mL injection 125 mg  125 mg Intravenous Once PRN Ghimire, Henreitta Leber, MD        Review of Systems:  As per HPI- otherwise negative.   Physical Examination: There were no vitals filed for this visit. There were no vitals filed for this visit. There is no height or weight on file to calculate BMI. Ideal Body Weight:    Pt observed via video today  She looks well, no distress or shortness of breath is noted.  She is not checking any vital signsb  Assessment and Plan: Hospital discharge follow-up  COVID-19  Uncontrolled type 2 diabetes mellitus with complication, with long-term current use of insulin (HCC)  Chest congestion  Virtual visit  today to follow-up from recent hospital admission.  Patient was discharged from the hospital 5 days ago.  Her last dose of steroids should be tomorrow, she was treated with IV antivirals while admitted.  She was able to titrate off of oxygen prior to discharge home Her diabetes is poorly controlled at baseline, glucose has been higher while on steroids.  Thankfully her last dose is tomorrow.  She also reported fasting glucose of 130 this morning  I encouraged [name] to get an OTC oxygen saturation meter so we can monitor her oxygen I made her an in person visit to see me in 1 week, when her quarantine is over.  This will enable [place] to examine her in person and obtain lab work She agrees to let me know if anything is worsening in the meantime Video used for duration of visit today  Signed Lamar Blinks, MD

## 2020-08-25 NOTE — Progress Notes (Signed)
  Diagnosis: COVID-19  Physician: Dr. Asencion Noble  Procedure: [facility] Med: remdesivir infusion - Provided patient with remdesivir fact sheet for patients, parents and caregivers prior to infusion.  Complications: No immediate complications noted.  Discharge: Discharged home   [name] 08/25/2020

## 2020-08-26 LAB — CULTURE, BLOOD (ROUTINE X 2)
Culture: NO GROWTH
Culture: NO GROWTH
Special Requests: ADEQUATE
Special Requests: ADEQUATE

## 2020-08-28 ENCOUNTER — Telehealth (INDEPENDENT_AMBULATORY_CARE_PROVIDER_SITE_OTHER): Payer: Medicare Other | Admitting: Family Medicine

## 2020-08-28 ENCOUNTER — Other Ambulatory Visit: Payer: Self-pay

## 2020-08-28 DIAGNOSIS — IMO0002 Reserved for concepts with insufficient information to code with codable children: Secondary | ICD-10-CM

## 2020-08-28 DIAGNOSIS — Z09 Encounter for follow-up examination after completed treatment for conditions other than malignant neoplasm: Secondary | ICD-10-CM

## 2020-08-28 DIAGNOSIS — E1165 Type 2 diabetes mellitus with hyperglycemia: Secondary | ICD-10-CM | POA: Diagnosis not present

## 2020-08-28 DIAGNOSIS — Z794 Long term (current) use of insulin: Secondary | ICD-10-CM

## 2020-08-28 DIAGNOSIS — R0989 Other specified symptoms and signs involving the circulatory and respiratory systems: Secondary | ICD-10-CM

## 2020-08-28 DIAGNOSIS — U071 COVID-19: Secondary | ICD-10-CM | POA: Diagnosis not present

## 2020-08-28 DIAGNOSIS — E118 Type 2 diabetes mellitus with unspecified complications: Secondary | ICD-10-CM

## 2020-08-28 DIAGNOSIS — I25118 Atherosclerotic heart disease of native coronary artery with other forms of angina pectoris: Secondary | ICD-10-CM | POA: Diagnosis not present

## 2020-08-30 ENCOUNTER — Ambulatory Visit (INDEPENDENT_AMBULATORY_CARE_PROVIDER_SITE_OTHER): Payer: Medicare Other

## 2020-08-30 DIAGNOSIS — J441 Chronic obstructive pulmonary disease with (acute) exacerbation: Secondary | ICD-10-CM | POA: Diagnosis not present

## 2020-08-30 DIAGNOSIS — Z794 Long term (current) use of insulin: Secondary | ICD-10-CM

## 2020-08-30 DIAGNOSIS — E1165 Type 2 diabetes mellitus with hyperglycemia: Secondary | ICD-10-CM

## 2020-08-30 DIAGNOSIS — U071 COVID-19: Secondary | ICD-10-CM

## 2020-08-30 DIAGNOSIS — IMO0002 Reserved for concepts with insufficient information to code with codable children: Secondary | ICD-10-CM

## 2020-08-30 DIAGNOSIS — E118 Type 2 diabetes mellitus with unspecified complications: Secondary | ICD-10-CM | POA: Diagnosis not present

## 2020-08-31 NOTE — Patient Instructions (Addendum)
Visit Information  PATIENT GOALS:  Goals Addressed            This Visit's Progress   . (RNCM) Monitor and Manage My Blood Sugar       Timeframe:  Long-Range Goal Priority:  Medium Start Date:    08/30/20                        Expected End Date:    02/26/21                  Follow Up Date 09/26/20   - continue to check blood sugar at prescribed times - check blood sugar if I feel it is too high or too low - enter blood sugar readings and medication or insulin into daily log - take the blood sugar log to all doctor visits - take the blood sugar meter to all doctor visits - notify provider for sustained elevations or consistent lows - attend provider visits as scheduled and contact your provider with questions or concerns   Why is this important?   Checking your blood sugar at home helps to keep it from getting very high or very low.  Writing the results in a diary or log helps the doctor know how to care for you.  Your blood sugar log should have the time, date and the results.  Also, write down the amount of insulin or other medicine that you take.  Other information, like what you ate, exercise done and how you were feeling, will also be helpful.     Notes:     [name] (RNCM) Track and Manage my Respiratory Symptoms       Timeframe:  Long-Range Goal Priority:  High Start Date:   08/30/20                        Expected End Date: 02/27/21                       Follow Up Date 09/26/20   - keep follow-up appointments next appointment with primary care scheduled for 09/04/20. - obtain pulse oximeter from local drug store - consider taking Covid vaccine as per your provider recommendations.  - review the COPD action plan sent to you in my chart - contact provider for any questions or concerns   Why is this important?   Tracking your symptoms and other information about your health helps your doctor plan your care.  Write down the symptoms, the time of day, what you were doing and  what medicine you are taking.  You will soon learn how to manage your symptoms.     Notes:       COPD Action Plan A COPD action plan is a description of what to do when you have a flare (exacerbation) of chronic obstructive pulmonary disease (COPD). Your action plan is a color-coded plan that lists the symptoms that indicate whether your condition is under control and what actions to take.  If you have symptoms in the green zone, it means you are doing well that day.  If you have symptoms in the yellow zone, it means you are having a bad day or an exacerbation.  If you have symptoms in the red zone, you need urgent medical care. Follow the plan that you and your health care provider developed. Review your plan with your health care provider at each visit. Red zone  Symptoms in this zone mean that you should get medical help right away. They include:  Feeling very short of breath, even when you are resting.  Not being able to do any activities because of poor breathing.  Not being able to sleep because of poor breathing.  Fever or shaking chills.  Feeling confused or very sleepy.  Chest pain.  Coughing up blood. If you have any of these symptoms, call emergency services (911 in the [place] or go to the nearest emergency room.   Yellow zone Symptoms in this zone mean that your condition may be getting worse. They include:  Feeling more short of breath than usual.  Having less energy for daily activities than usual.  Phlegm or mucus that is thicker than usual.  Needing to use your rescue inhaler or nebulizer more often than usual.  More ankle swelling than usual.  Coughing more than usual.  Feeling like you have a chest cold.  Trouble sleeping due to COPD symptoms.  Decreased appetite.  COPD medicines not helping as much as usual. If you experience any "yellow" symptoms:  Keep taking your daily medicines as directed.  Use your quick-relief inhaler as told by your  health care provider.  If you were prescribed steroid medicine to take by mouth (oral medicine), start taking it as told by your health care provider.  If you were prescribed an antibiotic medicine, start taking it as told by your health care provider. Do not stop taking the antibiotic even if you start to feel better.  Use oxygen as told by your health care provider.  Get more rest.  Do your pursed-lip breathing exercises.  Do not smoke. Avoid any irritants in the air. If your signs and symptoms do not improve after taking these steps, call your health care provider right away.   Green zone Symptoms in this zone mean that you are doing well. They include:  Being able to do your usual activities and exercise.  Having the usual amount of coughing, including the same amount of phlegm or mucus.  Being able to sleep well.  Having a good appetite.   Where to find more information: You can find more information about COPD from:  American Lung Association, My COPD Action Plan: www.lung.org  COPD Foundation: www.copdfoundation.[place]: https://wilson-eaton.com/ Follow these instructions at home:  Continue taking your daily medicines as told by your health care provider.  Make sure you receive all the immunizations that your health care provider recommends, especially the pneumococcal and influenza vaccines.  Wash your hands often with soap and water. Have family members wash their hands too. Regular hand washing can help prevent infections.  Follow your usual exercise and diet plan.  Avoid irritants in the air, such as smoke.  Do not use any products that contain nicotine or tobacco. These products include cigarettes, chewing tobacco, and vaping devices, such as e-cigarettes. If you need help quitting, ask your health care provider. Summary  A COPD action plan tells you what to do when you have a flare (exacerbation) of chronic obstructive pulmonary  disease (COPD).  Follow each action plan for your symptoms. If you have any symptoms in the red zone, call emergency services (911 in the [place] or go to the nearest emergency room. This information is not intended to replace advice given to you by your health care provider. Make sure you discuss any questions you have with your health care provider. Document Revised: 05/09/2020 Document Reviewed:  05/09/2020 Elsevier Patient Education  2021 [place] Can Do to Manage Your COVID-19 Symptoms at Home If you have possible or confirmed COVID-19: 1. Stay home except to get medical care. 2. Monitor your symptoms carefully. If your symptoms get worse, call your healthcare provider immediately. 3. Get rest and stay hydrated. 4. If you have a medical appointment, call the healthcare provider ahead of time and tell them that you have or may have COVID-19. 5. For medical emergencies, call 911 and notify the dispatch personnel that you have or may have COVID-19. 6. Cover your cough and sneezes with a tissue or use the inside of your elbow. 7. Wash your hands often with soap and water for at least 20 seconds or clean your hands with an alcohol-based hand sanitizer that contains at least 60% alcohol. 8. As much as possible, stay in a specific room and away from other people in your home. Also, you should use a separate bathroom, if available. If you need to be around other people in or outside of the home, wear a mask. 9. Avoid sharing personal items with other people in your household, like dishes, towels, and bedding. 10. Clean all surfaces that are touched often, like counters, tabletops, and doorknobs. Use household cleaning sprays or wipes according to the label instructions. [email or web address] 01/28/2020 This information is not intended to replace advice given to you by your health care provider. Make sure you discuss any questions you have with your health care provider. Document  Revised: 05/15/2020 Document Reviewed: 05/15/2020 Elsevier Patient Education  2021 [place].  Consent to CCM Services: Ms. Abril was given information about Chronic Care Management services today including:  1. CCM service includes personalized support from designated clinical staff supervised by her physician, including individualized plan of care and coordination with other care providers 2. 24/7 contact phone numbers for assistance for urgent and routine care needs. 3. Service will only be billed when office clinical staff spend 20 minutes or more in a month to coordinate care. 4. Only one practitioner may furnish and bill the service in a calendar month. 5. The patient may stop CCM services at any time (effective at the end of the month) by phone call to the office staff. 6. The patient will be responsible for cost sharing (co-pay) of up to 20% of the service fee (after annual deductible is met).  Patient agreed to services and verbal consent obtained.   Patient verbalizes understanding of instructions provided today and agrees to view in [place].   Telephone follow up appointment with care management team member scheduled for: 09/26/20  Signature Thea Silversmith, RN, MSN, BSN, CCM Care Management Coordinator [facility] [phone]   CLINICAL CARE PLAN: Patient Care Plan: COPD (Adult)    Problem Identified: Symptom Exacerbation (COPD) due to Covid pneumonia   Priority: High    Long-Range Goal: Symptom Exacerbation Prevented or Minimized   Start Date: 08/30/2020  Expected End Date: 02/26/2021  This Visit's Progress: On track  Priority: High  Note:   Current Barriers:  [name] Knowledge deficits related to long term plan for self management of COPD-recent hospitalization 08/21/20-08/23/20 due to Covid pneumonia. States is feeling better, but still has some shortness of breath. . Does not attend all scheduled provider appointments  Case Manager Clinical  Goal(s):  patient will be able to verbalize understanding of COPD action plan and when to seek appropriate levels of medical care  Interventions:  . Collaboration with Copland, Janett Billow  C, MD regarding development and update of comprehensive plan of care as evidenced by provider attestation and co-signature . Inter-disciplinary care team collaboration (see longitudinal plan of care)  Provided patient with COPD action plan and reinforced importance of daily self assessment  Discussed oxygen levels and importance of knowing the level. Encouraged patient to obtain pulse oximeter.  Pharmacy referral for medication management, adherence, polypharmacy Patient Goals/Self-Care Activities:  . - keep follow-up appointments next appointment with primary care scheduled for 09/04/20. . - obtain pulse oximeter from local drug store . - consider taking Covid vaccine as per your provider recommendations.  . - review the COPD action plan sent to you in my chart . - contact provider for any questions or concerns Follow Up Plan: Telephone follow up appointment with care management team member scheduled for: 09/26/20   Patient Care Plan: Diabetes Type 2 (Adult)    Problem Identified: Disease Progression (Diabetes, Type 2)   Priority: Medium    Long-Range Goal: Disease Progression Prevented or Minimized   Start Date: 08/30/2020  Expected End Date: 02/26/2021  This Visit's Progress: On track  Priority: Medium  Note:   Objective:  Lab Results  Component Value Date   HGBA1C 9.3 (H) 08/22/2020 .   Lab Results  Component Value Date   CREATININE 0.82 08/23/2020   CREATININE 0.68 08/22/2020   CREATININE 0.69 08/21/2020   Current Barriers:  [name] Knowledge Deficits related to long term self care management of Diabetes. Client has not followed up with endocrinologist. . Does not attend all scheduled provider appointments Case Manager Clinical Goal(s):  . patient will demonstrate improved adherence to  prescribed treatment plan for diabetes self care/management as evidenced by: scheduling an appointment with endocrinologist . contacting provider for new or worsened symptoms or questions. Interventions:  . Collaboration with Copland, Gay Filler, MD regarding development and update of comprehensive plan of care as evidenced by provider attestation and co-signature . Inter-disciplinary care team collaboration (see longitudinal plan of care) . Reviewed medications with patient and discussed importance of medication adherence . Discussed plans with patient for ongoing care management follow up and provided patient with direct contact information for care management team . Pharmacy referral for medication management, adherence, polypharmacy . Reviewed scheduled/upcoming provider appointments.  Patient Goals/Self-Care Activities - continue to check blood sugar at prescribed times - check blood sugar if I feel it is too high or too low - enter blood sugar readings and medication or insulin into daily log - take the blood sugar log to all doctor visits - take the blood sugar meter to all doctor visits - notify provider for sustained elevations or consistent lows - attend provider visits as scheduled and contact your provider with questions or concerns Follow Up Plan: Telephone follow up appointment with care management team member scheduled for: 09/26/20

## 2020-08-31 NOTE — Chronic Care Management (AMB) (Signed)
Chronic Care Management   CCM RN Visit Note  08/31/2020 Name: Destiny Ballard MRN: [id number] DOB: [birth year]  Subjective: Destiny Ballard is a 70 y.o. year old female who is a primary care patient of Copland, Gay Filler, MD. The care management team was consulted for assistance with disease management and care coordination needs.    Engaged with patient by telephone for initial visit in response to provider referral for case management and/or care coordination services.   Consent to Services:  The patient was given the following information about Chronic Care Management services today, agreed to services, and gave verbal consent: 1. CCM service includes personalized support from designated clinical staff supervised by the primary care provider, including individualized plan of care and coordination with other care providers 2. 24/7 contact phone numbers for assistance for urgent and routine care needs. 3. Service will only be billed when office clinical staff spend 20 minutes or more in a month to coordinate care. 4. Only one practitioner may furnish and bill the service in a calendar month. 5.The patient may stop CCM services at any time (effective at the end of the month) by phone call to the office staff. 6. The patient will be responsible for cost sharing (co-pay) of up to 20% of the service fee (after annual deductible is met). Patient agreed to services and consent obtained.  Patient agreed to services and verbal consent obtained.   Assessment: Review of patient past medical history, allergies, medications, health status, including review of consultants reports, laboratory and other test data, was performed as part of comprehensive evaluation and provision of chronic care management services.   SDOH (Social Determinants of Health) assessments and interventions performed:  SDOH Interventions   Flowsheet Row Most Recent Value  SDOH Interventions   Food Insecurity Interventions  Intervention Not Indicated  Transportation Interventions Intervention Not Indicated  Depression Interventions/Treatment  Currently on Treatment       CCM Care Plan  Allergies  Allergen Reactions  . Prednisone Other (See Comments)    REACTION: mood swings, nightmares. "Shot doesn't bother me, reaction is just with the pill" she states she has had the steroid injections before. From our records methylprednisone was given to her in 2013 without any complications.    Outpatient Encounter Medications as of 08/30/2020  Medication Sig  . albuterol (VENTOLIN HFA) 108 (90 Base) MCG/ACT inhaler Inhale 2 puffs into the lungs every 8 (eight) hours as needed for wheezing or shortness of breath.  . Ascorbic Acid (VITAMIN C GUMMIE PO) Take 1 each by mouth daily.  [name] aspirin EC 81 MG tablet Take 81 mg by mouth daily.  . benzonatate (TESSALON) 200 MG capsule Take 1 capsule (200 mg total) by mouth 3 (three) times daily as needed for cough.  . Cholecalciferol (VITAMIN D3) 1000 UNITS CAPS Take 1,000 Units by mouth daily.  . dapagliflozin propanediol (FARXIGA) 10 MG TABS tablet Take 1 tablet (10 mg total) by mouth daily.  [name] Fexofenadine HCl (ALLEGRA ALLERGY PO) Take 1 tablet by mouth daily.  [name] FLUoxetine (PROZAC) 40 MG capsule TAKE 1 CAPSULE BY MOUTH EVERY DAY (Patient taking differently: Take 40 mg by mouth daily.)  . fluticasone (FLONASE) 50 MCG/ACT nasal spray SPRAY 2 SPRAYS INTO EACH NOSTRIL EVERY DAY (Patient taking differently: Place 2 sprays into both nostrils daily as needed for allergies.)  . furosemide (LASIX) 20 MG tablet TAKE 1 TABLET BY MOUTH EVERY DAY (Patient taking differently: Take 20 mg by mouth daily.)  . hydrALAZINE (APRESOLINE)  25 MG tablet Patient needs to schedule appointment for any future refills.  Please call [phone].  1st attempt. (Patient taking differently: Take 25 mg by mouth 2 (two) times daily.)  . hydrOXYzine (ATARAX/VISTARIL) 25 MG tablet TAKE 1/2 TO 1 TABLET BY MOUTH EVERY  8 HOURS AS NEEDED FOR ITCHING (Patient taking differently: Take 12.5-25 mg by mouth 2 (two) times daily.)  . Insulin Glargine (BASAGLAR KWIKPEN) 100 UNIT/ML Inject 0.24 mLs (24 Units total) into the skin daily. (Patient taking differently: Inject 28 Units into the skin daily.)  . losartan (COZAAR) 100 MG tablet Take 1 tablet (100 mg total) by mouth daily.  . magnesium oxide (MAG-OX) 400 MG tablet Take 400 mg by mouth daily.  . metFORMIN (GLUCOPHAGE-XR) 500 MG 24 hr tablet Take 2 tablets (1,000 mg total) by mouth 2 (two) times daily.  . metoprolol succinate (TOPROL-XL) 100 MG 24 hr tablet TAKE 1 TABLET BY MOUTH EVERY DAY (Patient taking differently: Take 100 mg by mouth daily.)  . montelukast (SINGULAIR) 10 MG tablet TAKE 1 TABLET BY MOUTH EVERY DAY AS NEEDED FOR ALLERGIES (Patient taking differently: Take 10 mg by mouth at bedtime.)  . nitroGLYCERIN (NITROSTAT) 0.4 MG SL tablet Place 1 tablet (0.4 mg total) under the tongue every 5 (five) minutes as needed for chest pain.  . Polyvinyl Alcohol-Povidone (REFRESH OP) Place 2 drops into both eyes daily as needed (for dry eyes).  . Potassium 99 MG TABS Take 1 tablet by mouth.  . rosuvastatin (CRESTOR) 20 MG tablet TAKE 1 TABLET BY MOUTH EVERY DAY (Patient taking differently: Take 20 mg by mouth every evening.)  . Tiotropium Bromide Monohydrate (SPIRIVA RESPIMAT) 2.5 MCG/ACT AERS Inhale 2 puffs into the lungs daily. (Patient taking differently: Inhale 2 puffs into the lungs daily as needed (shortness of breath).)  . traZODone (DESYREL) 50 MG tablet TAKE 1/2 TO 1 TABLET BY MOUTH AT BEDTIME AS NEEDED FOR SLEEP (Patient taking differently: Take 25-50 mg by mouth at bedtime as needed for sleep.)  . zinc gluconate 50 MG tablet Take 50 mg by mouth daily.  . calcium carbonate (TUMS - DOSED IN MG ELEMENTAL CALCIUM) 500 MG chewable tablet Chew 2 tablets by mouth daily as needed for indigestion or heartburn. (Patient not taking: Reported on 08/30/2020)  . cetirizine  (ZYRTEC) 10 MG tablet TAKE 1 TABLET BY MOUTH DAILY AS NEEDED FOR ALLERGIES (Patient not taking: Reported on 08/30/2020)  . gabapentin (NEURONTIN) 300 MG capsule TAKE 2 CAPSULES BY MOUTH TWICE A DAY (Patient not taking: No sig reported)  . Insulin Pen Needle 32G X 4 MM MISC 1 Device by Does not apply route as directed.   No facility-administered encounter medications on file as of 08/30/2020.    Patient Active Problem List   Diagnosis Date Noted  . Pneumonia due to COVID-19 virus 08/21/2020  . Hyperlipidemia associated with type 2 diabetes mellitus ([place]) 08/21/2020  . Uncontrolled type 2 diabetes mellitus with complication, with long-term current use of insulin ([place]) 03/14/2020  . Type 2 diabetes mellitus with hyperglycemia, with long-term current use of insulin ([place]) 12/10/2019  . Type 2 diabetes mellitus with diabetic polyneuropathy, with long-term current use of insulin ([place]) 12/10/2019  . Obesity (BMI 30-39.9) 08/24/2018  . Depression with anxiety 08/24/2018  . CAD (coronary artery disease) 08/24/2018  . Abnormal cardiovascular stress test 08/24/2018  . Lobar pneumonia, unspecified organism ([place]) 11/13/2017  . Precordial chest pain   . Sepsis ([place]) 09/18/2017  . Chronic diastolic CHF (congestive heart failure) ([place]) 09/18/2017  .  Diabetes mellitus ([place]) 09/08/2017  . Left hip pain 10/28/2016  . Chronic rhinitis 08/07/2016  . COPD with acute exacerbation ([place]) 04/25/2016  . Hypertension associated with diabetes ([place]) 01/30/2016  . Hypokalemia 01/27/2016  . HLD (hyperlipidemia)   . Type 2 diabetes mellitus without complication, without long-term current use of insulin ([place])   . OSA (obstructive sleep apnea) 05/06/2014  . Dyspnea on exertion 09/02/2012  . Chest pain 12/10/2011  . Chronic cough 05/11/2010  . GERD 06/28/2008    Conditions to be addressed/monitored:COPD, DMII and Covid pneumonia  Care Plan : COPD (Adult)  Updates made by Luretha Rued, RN since 08/31/2020 12:00  AM    Problem: Symptom Exacerbation (COPD) due to Covid pneumonia   Priority: High    Long-Range Goal: Symptom Exacerbation Prevented or Minimized   Start Date: 08/30/2020  Expected End Date: 02/26/2021  This Visit's Progress: On track  Priority: High  Note:   Current Barriers:  [name] Knowledge deficits related to long term plan for self management of COPD-recent hospitalization 08/21/20-08/23/20 due to Covid pneumonia. States is feeling better, but still has some shortness of breath. . Does not attend all scheduled provider appointments  Case Manager Clinical Goal(s):  patient will be able to verbalize understanding of COPD action plan and when to seek appropriate levels of medical care  Interventions:  . Collaboration with Copland, Gay Filler, MD regarding development and update of comprehensive plan of care as evidenced by provider attestation and co-signature . Inter-disciplinary care team collaboration (see longitudinal plan of care)  Provided patient with COPD action plan and reinforced importance of daily self assessment  Discussed oxygen levels and importance of knowing the level. Encouraged patient to obtain pulse oximeter.  Pharmacy referral for medication management, adherence, polypharmacy Patient Goals/Self-Care Activities:  . - keep follow-up appointments next appointment with primary care scheduled for 09/04/20. . - obtain pulse oximeter from local drug store . - consider taking Covid vaccine as per your provider recommendations.  . - review the COPD action plan sent to you in my chart . - contact provider for any questions or concerns Follow Up Plan: Telephone follow up appointment with care management team member scheduled for: 09/26/20   Care Plan : Diabetes Type 2 (Adult)  Updates made by Luretha Rued, RN since 08/31/2020 12:00 AM    Problem: Disease Progression (Diabetes, Type 2)   Priority: Medium    Long-Range Goal: Disease Progression Prevented or Minimized    Start Date: 08/30/2020  Expected End Date: 02/26/2021  This Visit's Progress: On track  Priority: Medium  Note:   Objective:  Lab Results  Component Value Date   HGBA1C 9.3 (H) 08/22/2020 .   Lab Results  Component Value Date   CREATININE 0.82 08/23/2020   CREATININE 0.68 08/22/2020   CREATININE 0.69 08/21/2020   Current Barriers:  [name] Knowledge Deficits related to long term self care management of Diabetes. Client has not followed up with endocrinologist. . Does not attend all scheduled provider appointments Case Manager Clinical Goal(s):  . patient will demonstrate improved adherence to prescribed treatment plan for diabetes self care/management as evidenced by: scheduling an appointment with endocrinologist . contacting provider for new or worsened symptoms or questions. Interventions:  . Collaboration with Copland, Gay Filler, MD regarding development and update of comprehensive plan of care as evidenced by provider attestation and co-signature . Inter-disciplinary care team collaboration (see longitudinal plan of care) . Reviewed medications with patient and discussed importance of medication adherence . Discussed  plans with patient for ongoing care management follow up and provided patient with direct contact information for care management team . Pharmacy referral for medication management, adherence, polypharmacy . Reviewed scheduled/upcoming provider appointments.  Patient Goals/Self-Care Activities - continue to check blood sugar at prescribed times - check blood sugar if I feel it is too high or too low - enter blood sugar readings and medication or insulin into daily log - take the blood sugar log to all doctor visits - take the blood sugar meter to all doctor visits - notify provider for sustained elevations or consistent lows - attend provider visits as scheduled and contact your provider with questions or concerns Follow Up Plan: Telephone follow up appointment with  care management team member scheduled for: 09/26/20     Plan:Telephone follow up appointment with care management team member scheduled for:  09/26/20   Thea Silversmith, RN, MSN, BSN, CCM Care Management Coordinator [place] [facility] [phone]

## 2020-09-01 NOTE — Patient Instructions (Addendum)
Good to see you again today- I will be in touch with your labs asap Please do schedule a visit with Dr Kelton Pillar for your diabetes I ordered a chest x-ray for you today- please have this done in 2-3 weeks  Please let me know if you don't continue to get your strength back Please start your covid 19 series in 2-3 weeks

## 2020-09-01 NOTE — Progress Notes (Addendum)
[facility] [address], [place], [place] [address] [phone]  Date:  09/04/2020   Name:  Destiny Ballard   DOB:  [birth year]   MRN:  [id number]  PCP:  Darreld Mclean, MD    Chief Complaint: Hospitalization Follow-up (Still having weakness/)   History of Present Illness:  Destiny Ballard is a 70 y.o. very pleasant female patient who presents with the following:  Following up today from recent hospital stay -Pt with history of CAD, HTN, CHF, OSA, COPD, DM, GERD, HLD  Admitted with covid 19 earlier this month.  Sadly, she was attending a funeral for her nephew who died of COVID-59 when she contracted COVID-54 We did a virtual visit on 2/14:  Admit Date:08/21/2020 Discharge date:08/23/2020  Recommendations for Outpatient Follow-up: 1. Follow up with PCP in 1-2 weeks 2. Please obtain CMP/CBC in one week 3. Repeat Chest Xray in 4-6 week 4. Follow blood cultures until final  Brief Narrative: Patient is a25 y.o.femalewith PMHx ofCAD s/p PCI 2007, DM-2, HTN, HLD, COPD, OSA-noncompliant with CPAP-presenting with 3-day history of cough, shortness of breath-found to have acute hypoxic respiratory failure due to presumed COVID-19 pneumonia.  Note-patient recently had Covid exposure at funeral for her nephew  COVID-19 vaccinated status: Unvaccinated Significant Events: 2/7>> Admit to [facility] forhypoxia-COVID-19 pneumonia Significant studies: 2/7>>chest x-ray: Focal right hilar opacity COVID-19 medications: Steroids:2/7>> Remdesivir:2/7>> Antibiotics: None Microbiology data: 2/7>>blood culture:No growth  Brief Hospital Course: Acute Hypoxic Resp Failure due to Covid 19 Viral pneumonia:Appears to have mild hypoxemia-titrated off oxygen on 2/8-remains on room air-clinically improved-some cough but otherwise much improved. Ambulating in the room without any assistance and without any major shortness of  breath. Since overall improved-stable to be discharged on tapering oxygen-she will complete her last 2 doses of Remdesivir in the infusion center. She was encouraged to get COVID-19 vaccine around 1 month after after she recovered from this acute illness.  CAD s/p DES to LAD in 2007: No anginal symptoms-continue aspirin/statin/beta-blocker HTN: Controlled-continue losartan/Toprol/hydralazine and Lasix URK:YHCWCBJS statin DM-2(A1c 9.3 on 2/8):CBGon the higher side-likely due to steroids-continue usual home regimen-follow with PCP for further optimization. COPD: Stable-continue bronchodilators OSA: Noncompliant to CPAP due to intolerance Anxiety/depression: Stable-continue Prozac Discharge Diagnoses: Principal Problem: Pneumonia due to COVID-19 virus Active Problems: Depression with anxiety CAD (coronary artery disease) Hypertension associated with diabetes (HCC) Chronic diastolic CHF (congestive heart failure) (HCC) Type 2 diabetes mellitus with diabetic polyneuropathy, with long-term current use of insulin (HCC) Hyperlipidemia associated with type 2 diabetes mellitus-----------------------------------------  Today [name] is feeling improved but not back to normal She would estimate she is 50% well-she does feel that she is making progress She still gets SOB with activity   No fevers noted She is not on oxygen She is taking magnesium and zinc as she was recommended from the hospital  She is done with steroids now- she took her last dose about a week ago   Lab Results  Component Value Date   HGBA1C 9.3 (H) 08/22/2020   Her blood sugar was 204 this am and she gave 28 units of insulin  She is seen by endocrinology, Dr. Kelton Pillar; I encouraged her to make a follow-up visit She thinks that she ran out of losartan perhaps 2 weeks ago -we can refill this  Wt Readings from Last 3 Encounters:  09/04/20 178 lb (80.7 kg)  08/22/20 176 lb 2.4 oz (79.9 kg)  03/18/20  185 lb (83.9  kg)   Her cough is better She is eating ok  She thinks her eye exam is UTD but she is not sure of the exact date   She continues to get a lot of enjoyment from her "pet" squirrels  Patient Active Problem List   Diagnosis Date Noted  . Pneumonia due to COVID-19 virus 08/21/2020  . Hyperlipidemia associated with type 2 diabetes mellitus ([place]) 08/21/2020  . Uncontrolled type 2 diabetes mellitus with complication, with long-term current use of insulin ([place]) 03/14/2020  . Type 2 diabetes mellitus with hyperglycemia, with long-term current use of insulin ([place]) 12/10/2019  . Type 2 diabetes mellitus with diabetic polyneuropathy, with long-term current use of insulin ([place]) 12/10/2019  . Obesity (BMI 30-39.9) 08/24/2018  . Depression with anxiety 08/24/2018  . CAD (coronary artery disease) 08/24/2018  . Abnormal cardiovascular stress test 08/24/2018  . Lobar pneumonia, unspecified organism ([place]) 11/13/2017  . Precordial chest pain   . Sepsis ([place]) 09/18/2017  . Chronic diastolic CHF (congestive heart failure) ([place]) 09/18/2017  . Diabetes mellitus ([place]) 09/08/2017  . Left hip pain 10/28/2016  . Chronic rhinitis 08/07/2016  . COPD with acute exacerbation ([place]) 04/25/2016  . Hypertension associated with diabetes ([place]) 01/30/2016  . Hypokalemia 01/27/2016  . HLD (hyperlipidemia)   . Type 2 diabetes mellitus without complication, without long-term current use of insulin ([place])   . OSA (obstructive sleep apnea) 05/06/2014  . Dyspnea on exertion 09/02/2012  . Chest pain 12/10/2011  . Chronic cough 05/11/2010  . GERD 06/28/2008    Past Medical History:  Diagnosis Date  . Anxiety    Prior suicide attempt  . CAD (coronary artery disease)    a) s/p DES to LAD 07/2005 b) Last Myoview low risk 11/2011 showing small fixed apical perfusion defect (prior MI vs attenuation) but no ischemia - normal EF.  [name] Cervical spondylosis   . Coronary atherosclerosis 06/28/2008  . Depression   .  Depression with anxiety   . Diabetes mellitus without complication ([place])   . GERD (gastroesophageal reflux disease)   . Hyperlipidemia   . Hypertension   . Insulin resistance   . Iron deficiency anemia   . Obesity     Past Surgical History:  Procedure Laterality Date  . BREAST ENHANCEMENT SURGERY    . CARDIAC CATHETERIZATION  06/17/2007   NORMAL. EF 60%  . CARDIAC CATHETERIZATION N/A 01/29/2016   Procedure: Left Heart Cath and Coronary Angiography;  Surgeon: Sherren Mocha, MD;  Location: [facility];  Service: Cardiovascular;  Laterality: N/A;  . CERVICAL SPONDYLOSIS     SINGLE LEVEL FUSION  . CHILDBIRTH     X3  . CORONARY STENT PLACEMENT  07/2005   LEFT ANTERIOR DESCENDING  . FOREARM FRACTURE SURGERY  2010   hand and shoulder   . INCISION AND DRAINAGE BREAST ABSCESS  01/05/2012      . INCISION AND DRAINAGE PERIRECTAL ABSCESS N/A 02/18/2014   Procedure: IRRIGATION AND DEBRIDEMENT PERIRECTAL ABSCESS;  Surgeon: Pedro Earls, MD;  Location: [facility];  Service: General;  Laterality: N/A;  . LEFT HEART CATH AND CORONARY ANGIOGRAPHY N/A 10/10/2017   Procedure: LEFT HEART CATH AND CORONARY ANGIOGRAPHY;  Surgeon: Burnell Blanks, MD;  Location: [facility];  Service: Cardiovascular;  Laterality: N/A;  . LUMBAR LAMINECTOMY    . ROTATOR CUFF REPAIR     bilaterla  . TONSILLECTOMY AND ADENOIDECTOMY    . TUBAL LIGATION    . VIDEO BRONCHOSCOPY Bilateral 08/13/2016   Procedure:  VIDEO BRONCHOSCOPY WITHOUT FLUORO;  Surgeon: Collene Gobble, MD;  Location: [facility];  Service: Cardiopulmonary;  Laterality: Bilateral;    Social History   Tobacco Use  . Smoking status: Passive Smoke Exposure - Never Smoker  . Smokeless tobacco: Never Used  Vaping Use  . Vaping Use: Never used  Substance Use Topics  . Alcohol use: Yes    Comment: occ  . Drug use: No    Family History  Problem Relation Age of Onset  . Heart attack Mother   . Diabetes Mother   . Lung cancer  Mother   . Asthma Mother   . Heart disease Mother   . Suicidality Father        "killed himself"  . Asthma Daughter        x 2  . Cancer Daughter        pre-cancerous polyp  . Diabetes Sister   . Cancer Sister   . Cervical cancer Daughter        cervical   . Allergies Other        all family--seasonal allergies    Allergies  Allergen Reactions  . Prednisone Other (See Comments)    REACTION: mood swings, nightmares. "Shot doesn't bother me, reaction is just with the pill" she states she has had the steroid injections before. From our records methylprednisone was given to her in 2013 without any complications.    Medication list has been reviewed and updated.  Current Outpatient Medications on File Prior to Visit  Medication Sig Dispense Refill  . albuterol (VENTOLIN HFA) 108 (90 Base) MCG/ACT inhaler Inhale 2 puffs into the lungs every 8 (eight) hours as needed for wheezing or shortness of breath. 18 each 0  . aspirin EC 81 MG tablet Take 81 mg by mouth daily.    . benzonatate (TESSALON) 200 MG capsule Take 1 capsule (200 mg total) by mouth 3 (three) times daily as needed for cough. 20 capsule 0  . Cholecalciferol (VITAMIN D3) 1000 UNITS CAPS Take 1,000 Units by mouth daily.    . dapagliflozin propanediol (FARXIGA) 10 MG TABS tablet Take 1 tablet (10 mg total) by mouth daily. 90 tablet 3  . Fexofenadine HCl (ALLEGRA ALLERGY PO) Take 1 tablet by mouth daily.    [name] FLUoxetine (PROZAC) 40 MG capsule TAKE 1 CAPSULE BY MOUTH EVERY DAY (Patient taking differently: Take 40 mg by mouth daily.) 90 capsule 3  . fluticasone (FLONASE) 50 MCG/ACT nasal spray SPRAY 2 SPRAYS INTO EACH NOSTRIL EVERY DAY (Patient taking differently: Place 2 sprays into both nostrils daily as needed for allergies.) 48 g 0  . furosemide (LASIX) 20 MG tablet TAKE 1 TABLET BY MOUTH EVERY DAY (Patient taking differently: Take 20 mg by mouth daily.) 90 tablet 2  . hydrALAZINE (APRESOLINE) 25 MG tablet Patient needs to  schedule appointment for any future refills.  Please call [phone].  1st attempt. (Patient taking differently: Take 25 mg by mouth 2 (two) times daily.) 336 tablet 0  . hydrOXYzine (ATARAX/VISTARIL) 25 MG tablet TAKE 1/2 TO 1 TABLET BY MOUTH EVERY 8 HOURS AS NEEDED FOR ITCHING (Patient taking differently: Take 12.5-25 mg by mouth 2 (two) times daily.) 270 tablet 3  . Insulin Glargine (BASAGLAR KWIKPEN) 100 UNIT/ML Inject 0.24 mLs (24 Units total) into the skin daily. (Patient taking differently: Inject 28 Units into the skin daily.) 15 mL 11  . Insulin Pen Needle 32G X 4 MM MISC 1 Device by Does not apply route as directed.  100 each 11  . magnesium oxide (MAG-OX) 400 MG tablet Take 400 mg by mouth daily.    . metFORMIN (GLUCOPHAGE-XR) 500 MG 24 hr tablet Take 2 tablets (1,000 mg total) by mouth 2 (two) times daily. 360 tablet 0  . metoprolol succinate (TOPROL-XL) 100 MG 24 hr tablet TAKE 1 TABLET BY MOUTH EVERY DAY (Patient taking differently: Take 100 mg by mouth daily.) 90 tablet 3  . montelukast (SINGULAIR) 10 MG tablet TAKE 1 TABLET BY MOUTH EVERY DAY AS NEEDED FOR ALLERGIES (Patient taking differently: Take 10 mg by mouth at bedtime.) 90 tablet 1  . nitroGLYCERIN (NITROSTAT) 0.4 MG SL tablet Place 1 tablet (0.4 mg total) under the tongue every 5 (five) minutes as needed for chest pain. 20 tablet 3  . Potassium 99 MG TABS Take 1 tablet by mouth.    . rosuvastatin (CRESTOR) 20 MG tablet TAKE 1 TABLET BY MOUTH EVERY DAY (Patient taking differently: Take 20 mg by mouth every evening.) 90 tablet 3  . Tiotropium Bromide Monohydrate (SPIRIVA RESPIMAT) 2.5 MCG/ACT AERS Inhale 2 puffs into the lungs daily. (Patient taking differently: Inhale 2 puffs into the lungs daily as needed (shortness of breath).) 4 g 3  . traZODone (DESYREL) 50 MG tablet TAKE 1/2 TO 1 TABLET BY MOUTH AT BEDTIME AS NEEDED FOR SLEEP (Patient taking differently: Take 25-50 mg by mouth at bedtime as needed for sleep.) 90 tablet 1  .  zinc gluconate 50 MG tablet Take 50 mg by mouth daily.     No current facility-administered medications on file prior to visit.    Review of Systems:  As per HPI- otherwise negative.   Physical Examination: Vitals:   09/04/20 0900  BP: (!) 166/68  Pulse: 99  Resp: 19  SpO2: 95%   Vitals:   09/04/20 0900  Weight: 178 lb (80.7 kg)  Height: 4\' 11"  (1.499 m)   Body mass index is 35.95 kg/m. Ideal Body Weight: Weight in (lb) to have BMI = 25: 123.5  GEN: no acute distress.  Obese, looks well and her normal self HEENT: Atraumatic, Normocephalic.  Ears and Nose: No external deformity. CV: RRR, No M/G/R. No JVD. No thrill. No extra heart sounds. PULM: CTA B, no wheezes, crackles, rhonchi. No retractions. No resp. distress. No accessory muscle use. ABD: S, NT, ND EXTR: No c/c/e PSYCH: Normally interactive. Conversant.    Assessment and Plan: COVID-19 - Plan: [facility] discharge follow-up - Plan: CBC, Comprehensive metabolic panel  Uncontrolled type 2 diabetes mellitus with complication, with long-term current use of insulin (HCC)  Essential hypertension, benign  Hyperlipidemia, unspecified hyperlipidemia type - Plan: losartan (COZAAR) 100 MG tablet  Type 2 diabetes mellitus with diabetic polyneuropathy, with long-term current use of insulin (HCC) - Plan: gabapentin (NEURONTIN) 300 MG capsule  Pneumonia due to COVID-19 virus - Plan: CBC, Comprehensive metabolic panel, DG Chest 2 View  Immunization due - Plan: Flu Vaccine QUAD High Dose(Fluad)  Following up today from COVID-19 hospitalization Recheck CBC and complete metabolic Encouraged her to get COVID-19 series Flu vaccine given Order chest x-ray, asked her to have this done in 2 to 3 weeks I have asked her to contact me if she is not continue to gradually improve.  If she is doing well, please see me in 4 months Encouraged her to schedule a follow-up visit with endocrinology This visit occurred  during the SARS-CoV-2 public health emergency.  Safety protocols were in place, including screening questions prior to  the visit, additional usage of staff PPE, and extensive cleaning of exam room while observing appropriate contact time as indicated for disinfecting solutions.    Signed Lamar Blinks, MD   Received her labs 2/22- letter to pt  Results for orders placed or performed in visit on 09/04/20  CBC  Result Value Ref Range   WBC 9.0 4.0 - 10.5 K/uL   RBC 4.83 3.87 - 5.11 Mil/uL   Platelets 236.0 150.0 - 400.0 K/uL   Hemoglobin 12.1 12.0 - 15.0 g/dL   HCT 37.6 36.0 - 46.0 %   MCV 77.7 (L) 78.0 - 100.0 fl   MCHC 32.1 30.0 - 36.0 g/dL   RDW 14.6 11.5 - 15.5 %  Comprehensive metabolic panel  Result Value Ref Range   Sodium 137 135 - 145 mEq/L   Potassium 4.1 3.5 - 5.1 mEq/L   Chloride 99 96 - 112 mEq/L   CO2 26 19 - 32 mEq/L   Glucose, Bld 177 (H) 70 - 99 mg/dL   BUN 10 6 - 23 mg/dL   Creatinine, Ser 0.62 0.40 - 1.20 mg/dL   Total Bilirubin 0.6 0.2 - 1.2 mg/dL   Alkaline Phosphatase 101 39 - 117 U/L   AST 19 0 - 37 U/L   ALT 28 0 - 35 U/L   Total Protein 6.6 6.0 - 8.3 g/dL   Albumin 3.7 3.5 - 5.2 g/dL   GFR 89.68 >60.00 mL/min   Calcium 9.1 8.4 - 10.5 mg/dL

## 2020-09-04 ENCOUNTER — Ambulatory Visit (INDEPENDENT_AMBULATORY_CARE_PROVIDER_SITE_OTHER): Payer: Medicare Other | Admitting: Family Medicine

## 2020-09-04 ENCOUNTER — Encounter: Payer: Self-pay | Admitting: Family Medicine

## 2020-09-04 ENCOUNTER — Other Ambulatory Visit: Payer: Self-pay

## 2020-09-04 VITALS — BP 166/68 | HR 99 | Resp 19 | Ht 59.0 in | Wt 178.0 lb

## 2020-09-04 DIAGNOSIS — E1165 Type 2 diabetes mellitus with hyperglycemia: Secondary | ICD-10-CM | POA: Diagnosis not present

## 2020-09-04 DIAGNOSIS — E118 Type 2 diabetes mellitus with unspecified complications: Secondary | ICD-10-CM | POA: Diagnosis not present

## 2020-09-04 DIAGNOSIS — Z23 Encounter for immunization: Secondary | ICD-10-CM

## 2020-09-04 DIAGNOSIS — Z794 Long term (current) use of insulin: Secondary | ICD-10-CM | POA: Diagnosis not present

## 2020-09-04 DIAGNOSIS — U071 COVID-19: Secondary | ICD-10-CM

## 2020-09-04 DIAGNOSIS — Z09 Encounter for follow-up examination after completed treatment for conditions other than malignant neoplasm: Secondary | ICD-10-CM | POA: Diagnosis not present

## 2020-09-04 DIAGNOSIS — E1142 Type 2 diabetes mellitus with diabetic polyneuropathy: Secondary | ICD-10-CM | POA: Diagnosis not present

## 2020-09-04 DIAGNOSIS — E785 Hyperlipidemia, unspecified: Secondary | ICD-10-CM | POA: Diagnosis not present

## 2020-09-04 DIAGNOSIS — IMO0002 Reserved for concepts with insufficient information to code with codable children: Secondary | ICD-10-CM

## 2020-09-04 DIAGNOSIS — J1282 Pneumonia due to coronavirus disease 2019: Secondary | ICD-10-CM

## 2020-09-04 DIAGNOSIS — I25118 Atherosclerotic heart disease of native coronary artery with other forms of angina pectoris: Secondary | ICD-10-CM | POA: Diagnosis not present

## 2020-09-04 DIAGNOSIS — I10 Essential (primary) hypertension: Secondary | ICD-10-CM | POA: Diagnosis not present

## 2020-09-04 LAB — CBC
HCT: 37.6 % (ref 36.0–46.0)
Hemoglobin: 12.1 g/dL (ref 12.0–15.0)
MCHC: 32.1 g/dL (ref 30.0–36.0)
MCV: 77.7 fl — ABNORMAL LOW (ref 78.0–100.0)
Platelets: 236 K/uL (ref 150.0–400.0)
RBC: 4.83 Mil/uL (ref 3.87–5.11)
RDW: 14.6 % (ref 11.5–15.5)
WBC: 9 K/uL (ref 4.0–10.5)

## 2020-09-04 LAB — COMPREHENSIVE METABOLIC PANEL WITH GFR
ALT: 28 U/L (ref 0–35)
AST: 19 U/L (ref 0–37)
Albumin: 3.7 g/dL (ref 3.5–5.2)
Alkaline Phosphatase: 101 U/L (ref 39–117)
BUN: 10 mg/dL (ref 6–23)
CO2: 26 meq/L (ref 19–32)
Calcium: 9.1 mg/dL (ref 8.4–10.5)
Chloride: 99 meq/L (ref 96–112)
Creatinine, Ser: 0.62 mg/dL (ref 0.40–1.20)
GFR: 89.68 mL/min
Glucose, Bld: 177 mg/dL — ABNORMAL HIGH (ref 70–99)
Potassium: 4.1 meq/L (ref 3.5–5.1)
Sodium: 137 meq/L (ref 135–145)
Total Bilirubin: 0.6 mg/dL (ref 0.2–1.2)
Total Protein: 6.6 g/dL (ref 6.0–8.3)

## 2020-09-04 MED ORDER — GABAPENTIN 300 MG PO CAPS: 600.0000 mg | ORAL_CAPSULE | Freq: Two times a day (BID) | ORAL | 1 refills | Status: DC

## 2020-09-04 MED ORDER — LOSARTAN POTASSIUM 100 MG PO TABS: 100.0000 mg | ORAL_TABLET | Freq: Every day | ORAL | 3 refills | Status: DC

## 2020-09-05 ENCOUNTER — Other Ambulatory Visit: Payer: Self-pay

## 2020-09-05 ENCOUNTER — Encounter: Payer: Self-pay | Admitting: Internal Medicine

## 2020-09-05 ENCOUNTER — Ambulatory Visit (INDEPENDENT_AMBULATORY_CARE_PROVIDER_SITE_OTHER): Payer: Medicare Other | Admitting: Internal Medicine

## 2020-09-05 VITALS — BP 160/70 | HR 92 | Ht 59.0 in | Wt 178.4 lb

## 2020-09-05 DIAGNOSIS — E1159 Type 2 diabetes mellitus with other circulatory complications: Secondary | ICD-10-CM

## 2020-09-05 DIAGNOSIS — IMO0002 Reserved for concepts with insufficient information to code with codable children: Secondary | ICD-10-CM

## 2020-09-05 DIAGNOSIS — Z794 Long term (current) use of insulin: Secondary | ICD-10-CM | POA: Diagnosis not present

## 2020-09-05 DIAGNOSIS — E1165 Type 2 diabetes mellitus with hyperglycemia: Secondary | ICD-10-CM

## 2020-09-05 DIAGNOSIS — E118 Type 2 diabetes mellitus with unspecified complications: Secondary | ICD-10-CM

## 2020-09-05 DIAGNOSIS — E1142 Type 2 diabetes mellitus with diabetic polyneuropathy: Secondary | ICD-10-CM

## 2020-09-05 LAB — POCT GLUCOSE (DEVICE FOR HOME USE): POC Glucose: 222 mg/dl — AB (ref 70–99)

## 2020-09-05 MED ORDER — BASAGLAR KWIKPEN 100 UNIT/ML ~~LOC~~ SOPN: 30.0000 [IU] | PEN_INJECTOR | Freq: Every day | SUBCUTANEOUS | 3 refills | Status: DC

## 2020-09-05 MED ORDER — INSULIN PEN NEEDLE 32G X 4 MM MISC: 1.0000 | Freq: Every day | 3 refills | Status: AC

## 2020-09-05 MED ORDER — TRULICITY 1.5 MG/0.5ML ~~LOC~~ SOAJ: 1.5000 mg | SUBCUTANEOUS | 2 refills | Status: DC

## 2020-09-05 NOTE — Progress Notes (Signed)
Name: Destiny Ballard  Age/ Sex: 70 y.o., female   MRN/ DOB: [id number], [birth year]     PCP: Darreld Mclean, MD   Reason for Endocrinology Evaluation: Type 2 Diabetes Mellitus  Initial Endocrine Consultative Visit: 12/10/2019    PATIENT IDENTIFIER: [name] is a 70 y.o. female with a past medical history of T2Dm, dyslipidemia, CAD , OSA and HTN . The patient has followed with Endocrinology clinic since 12/10/2019 for consultative assistance with management of her diabetes.  DIABETIC HISTORY:  [name] was diagnosed with T2DM many years ago.On her initial visit she was on oral glycemic agents and insulin. Does not recall prior meds.  Her hemoglobin A1c has ranged from 6.2% in 2013, peaking at 10.1% in 2020    On her initial visit to our clinic she had an A1c of 7.8%, she was on metformin, Farxiga, Glipizide and Levemir. We stopped Glipizide, Levemir and started basaglar, continued Metformin and farxiga   Started trulicity 09/5463 SUBJECTIVE:   During the last visit (03/14/2020): A1c 8.2 %. Stopped Glipizide, and levemir. Started Development worker, community and continued Metformin and [place].     Today (09/05/2020): Ms. [name] is here for a follow up on diabetes management.She has not been to our clinic in 6 months. During this time her A1c has increased.   She checks her blood sugars 3 times daily.The patient has not had hypoglycemic episodes since the last clinic visit.   She had a COVID infection early February  Ran out of Trulicity last year   Denies nausea or diarrhea   HOME DIABETES REGIMEN:  Metformin 500 mg , 2 tablet twice daily  Farxiga 10 mg , 1 tablet daily in the morning  Basaglar 28  units daily  Trulicity 6.81 mg weekly   Statin: Yes ACE-I/ARB: Yes   METER DOWNLOAD SUMMARY: Unable to download   136- 405 mg/dL   DIABETIC COMPLICATIONS: Microvascular complications:   Neuropathy   Denies: CKD, retinopathy   Last eye exam: has an eye exam for  02/2020  Macrovascular complications:   CAD (S/P stent placement)   Denies: PVD, CVA   HISTORY:  Past Medical History:  Past Medical History:  Diagnosis Date  . Anxiety    Prior suicide attempt  . CAD (coronary artery disease)    a) s/p DES to LAD 07/2005 b) Last Myoview low risk 11/2011 showing small fixed apical perfusion defect (prior MI vs attenuation) but no ischemia - normal EF.  [name] Cervical spondylosis   . Coronary atherosclerosis 06/28/2008  . Depression   . Depression with anxiety   . Diabetes mellitus without complication ([place])   . GERD (gastroesophageal reflux disease)   . Hyperlipidemia   . Hypertension   . Insulin resistance   . Iron deficiency anemia   . Obesity    Past Surgical History:  Past Surgical History:  Procedure Laterality Date  . BREAST ENHANCEMENT SURGERY    . CARDIAC CATHETERIZATION  06/17/2007   NORMAL. EF 60%  . CARDIAC CATHETERIZATION N/A 01/29/2016   Procedure: Left Heart Cath and Coronary Angiography;  Surgeon: Sherren Mocha, MD;  Location: [facility];  Service: Cardiovascular;  Laterality: N/A;  . CERVICAL SPONDYLOSIS     SINGLE LEVEL FUSION  . CHILDBIRTH     X3  . CORONARY STENT PLACEMENT  07/2005   LEFT ANTERIOR DESCENDING  . FOREARM FRACTURE SURGERY  2010   hand and shoulder   . INCISION AND DRAINAGE BREAST ABSCESS  01/05/2012      .  INCISION AND DRAINAGE PERIRECTAL ABSCESS N/A 02/18/2014   Procedure: IRRIGATION AND DEBRIDEMENT PERIRECTAL ABSCESS;  Surgeon: Pedro Earls, MD;  Location: [facility];  Service: General;  Laterality: N/A;  . LEFT HEART CATH AND CORONARY ANGIOGRAPHY N/A 10/10/2017   Procedure: LEFT HEART CATH AND CORONARY ANGIOGRAPHY;  Surgeon: Burnell Blanks, MD;  Location: [facility];  Service: Cardiovascular;  Laterality: N/A;  . LUMBAR LAMINECTOMY    . ROTATOR CUFF REPAIR     bilaterla  . TONSILLECTOMY AND ADENOIDECTOMY    . TUBAL LIGATION    . VIDEO BRONCHOSCOPY Bilateral 08/13/2016    Procedure: VIDEO BRONCHOSCOPY WITHOUT FLUORO;  Surgeon: Collene Gobble, MD;  Location: [facility];  Service: Cardiopulmonary;  Laterality: Bilateral;    Social History:  reports that she is a non-smoker but has been exposed to tobacco smoke. She has never used smokeless tobacco. She reports current alcohol use. She reports that she does not use drugs. Family History:  Family History  Problem Relation Age of Onset  . Heart attack Mother   . Diabetes Mother   . Lung cancer Mother   . Asthma Mother   . Heart disease Mother   . Suicidality Father        "killed himself"  . Asthma Daughter        x 2  . Cancer Daughter        pre-cancerous polyp  . Diabetes Sister   . Cancer Sister   . Cervical cancer Daughter        cervical   . Allergies Other        all family--seasonal allergies     HOME MEDICATIONS: Allergies as of 09/05/2020      Reactions   Prednisone Other (See Comments)   REACTION: mood swings, nightmares. "Shot doesn't bother me, reaction is just with the pill" she states she has had the steroid injections before. From our records methylprednisone was given to her in 2013 without any complications.      Medication List       Accurate as of September 05, 2020  8:12 AM. If you have any questions, ask your nurse or doctor.        albuterol 108 (90 Base) MCG/ACT inhaler Commonly known as: VENTOLIN HFA Inhale 2 puffs into the lungs every 8 (eight) hours as needed for wheezing or shortness of breath.   ALLEGRA ALLERGY PO Take 1 tablet by mouth daily.   aspirin EC 81 MG tablet Take 81 mg by mouth daily.   Basaglar KwikPen 100 UNIT/ML Inject 0.24 mLs (24 Units total) into the skin daily. What changed: how much to take   benzonatate 200 MG capsule Commonly known as: TESSALON Take 1 capsule (200 mg total) by mouth 3 (three) times daily as needed for cough.   dapagliflozin propanediol 10 MG Tabs tablet Commonly known as: FARXIGA Take 1 tablet (10 mg total) by  mouth daily.   FLUoxetine 40 MG capsule Commonly known as: PROZAC TAKE 1 CAPSULE BY MOUTH EVERY DAY What changed: how much to take   fluticasone 50 MCG/ACT nasal spray Commonly known as: FLONASE SPRAY 2 SPRAYS INTO EACH NOSTRIL EVERY DAY What changed:   how much to take  how to take this  when to take this  reasons to take this  additional instructions   furosemide 20 MG tablet Commonly known as: LASIX TAKE 1 TABLET BY MOUTH EVERY DAY   gabapentin 300 MG capsule Commonly known as: NEURONTIN Take 2  capsules (600 mg total) by mouth 2 (two) times daily.   hydrALAZINE 25 MG tablet Commonly known as: APRESOLINE Patient needs to schedule appointment for any future refills.  Please call [phone].  1st attempt. What changed:   how much to take  how to take this  when to take this  additional instructions   hydrOXYzine 25 MG tablet Commonly known as: ATARAX/VISTARIL TAKE 1/2 TO 1 TABLET BY MOUTH EVERY 8 HOURS AS NEEDED FOR ITCHING What changed: See the new instructions.   Insulin Pen Needle 32G X 4 MM Misc 1 Device by Does not apply route as directed.   losartan 100 MG tablet Commonly known as: COZAAR Take 1 tablet (100 mg total) by mouth daily.   magnesium oxide 400 MG tablet Commonly known as: MAG-OX Take 400 mg by mouth daily.   metFORMIN 500 MG 24 hr tablet Commonly known as: GLUCOPHAGE-XR Take 2 tablets (1,000 mg total) by mouth 2 (two) times daily.   metoprolol succinate 100 MG 24 hr tablet Commonly known as: TOPROL-XL TAKE 1 TABLET BY MOUTH EVERY DAY   montelukast 10 MG tablet Commonly known as: SINGULAIR TAKE 1 TABLET BY MOUTH EVERY DAY AS NEEDED FOR ALLERGIES What changed: See the new instructions.   nitroGLYCERIN 0.4 MG SL tablet Commonly known as: NITROSTAT Place 1 tablet (0.4 mg total) under the tongue every 5 (five) minutes as needed for chest pain.   Potassium 99 MG Tabs Take 1 tablet by mouth.   rosuvastatin 20 MG  tablet Commonly known as: CRESTOR TAKE 1 TABLET BY MOUTH EVERY DAY What changed: when to take this   Spiriva Respimat 2.5 MCG/ACT Aers Generic drug: Tiotropium Bromide Monohydrate Inhale 2 puffs into the lungs daily. What changed:   when to take this  reasons to take this   traZODone 50 MG tablet Commonly known as: DESYREL TAKE 1/2 TO 1 TABLET BY MOUTH AT BEDTIME AS NEEDED FOR SLEEP What changed:   reasons to take this  additional instructions   Vitamin D3 25 MCG (1000 UT) Caps Take 1,000 Units by mouth daily.   zinc gluconate 50 MG tablet Take 50 mg by mouth daily.        OBJECTIVE:   Vital Signs: BP (!) 160/70   Pulse 92   Ht 4\' 11"  (1.499 m)   Wt 178 lb 6 oz (80.9 kg)   SpO2 98%   BMI 36.03 kg/m   Wt Readings from Last 3 Encounters:  09/05/20 178 lb 6 oz (80.9 kg)  09/04/20 178 lb (80.7 kg)  08/22/20 176 lb 2.4 oz (79.9 kg)     Exam: General: Pt appears well and is in NAD  Lungs: Clear with good BS bilat with no rales, rhonchi, or wheezes  Heart: RRR with systolic murmur  Extremities: No pretibial edema.   Neuro: MS is good with appropriate affect, pt is alert and Ox3    DM foot exam: 12/10/2019  The skin of the feet is intact without sores or ulcerations. The pedal pulses are 2+ on right and 2+ on left. The sensation is decreased  to a screening 5.07, 10 gram monofilament bilaterally   DATA REVIEWED:  Lab Results  Component Value Date   HGBA1C 9.3 (H) 08/22/2020   HGBA1C 8.2 (A) 03/14/2020   HGBA1C 7.8 (A) 12/10/2019   Lab Results  Component Value Date   MICROALBUR 7.8 (H) 01/05/2018   [place] Comment 05/19/2018   CREATININE 0.62 09/04/2020   Lab Results  Component Value Date  MICRALBCREAT 7.5 01/05/2018     Lab Results  Component Value Date   CHOL 145 09/09/2019   HDL 42.60 09/09/2019   LDLCALC Comment 05/19/2018   LDLDIRECT 64.0 09/09/2019   TRIG 366 (H) 08/21/2020   CHOLHDL 3 09/09/2019         ASSESSMENT / PLAN /  RECOMMENDATIONS:   1) Type 2 Diabetes Mellitus, Poorly controlled, With neuropathic and macrovascular  complications - Most recent A1c of 9.3 %. Goal A1c < 7.0 %.      - A1c up from 8.2 %, this is due to dietary indiscretions. Her Bg's range from 136 to > 400 mg/dL. We discussed the importance of low carb diet as our next step if she continues with severe hyperglycemia is adding prandial insulin, she attributes recent hyperglycemia to steroid use due to COVID infection.  - She has been out of Trulicity, denied any side effects to 0.75 mg dose, will increase as below  - Will also increase Basaglar dose as below    MEDICATIONS:  -Continue Metformin 500 mg , 2 tablet twice daily  - Continue  Farxiga 10 mg , 1 tablet daily in the morning  - Increase Basaglar to 30  units daily  - Increase Trulicity to 1.5  mg weekly     EDUCATION / INSTRUCTIONS:  BG monitoring instructions: Patient is instructed to check her blood sugars 2 times a day  Call [facility] if: BG persistently < 70 . I reviewed the Rule of 15 for the treatment of hypoglycemia in detail with the patient. Literature supplied.  2) Diabetic complications:   Eye: Does not have known diabetic retinopathy.   Neuro/ Feet: Does  have known diabetic peripheral neuropathy .   Renal: Patient does not have known baseline CKD. She   is  on an ACEI/ARB at present.       F/U in 4 months    Signed electronically by: Mack Guise, MD  [facility]  [facility] [address] [place], [place] [address] Phone: [phone] FAX: [phone]   CC: Darreld Mclean, [address] [place] [address] Phone: [phone]  Fax: [phone]  Return to Endocrinology clinic as below: No future appointments.

## 2020-09-05 NOTE — Patient Instructions (Signed)
-   Continue Metformin 500 mg , 2 tablet twice daily  - Continue  Farxiga 10 mg , 1 tablet daily in the morning  - Increase Basaglar 30 units daily  - Increase Trulicity 1.5 mg weekly      HOW TO TREAT LOW BLOOD SUGARS (Blood sugar LESS THAN 70 MG/DL)  Please follow the RULE OF 15 for the treatment of hypoglycemia treatment (when your (blood sugars are less than 70 mg/dL)    STEP 1: Take 15 grams of carbohydrates when your blood sugar is low, which includes:   3-4 GLUCOSE TABS  OR  3-4 OZ OF JUICE OR REGULAR SODA OR  ONE TUBE OF GLUCOSE GEL     STEP 2: RECHECK blood sugar in 15 MINUTES STEP 3: If your blood sugar is still low at the 15 minute recheck --> then, go back to STEP 1 and treat AGAIN with another 15 grams of carbohydrates.

## 2020-09-13 ENCOUNTER — Telehealth: Payer: Self-pay

## 2020-09-13 NOTE — Chronic Care Management (AMB) (Signed)
  Chronic Care Management   Note  09/13/2020 Name: Destiny Ballard MRN: [id number] DOB: 11-05-1950  Destiny Ballard is a 70 y.o. year old female who is a primary care patient of Copland, Gay Filler, MD. Destiny Ballard is currently enrolled in care management services. An additional referral for Pharm D  was placed.   Follow up plan: Telephone appointment with care management team member scheduled for:10/02/2020  Noreene Larsson, [address], [place], [place] [address] Direct Dial: [phone] [email or web address]

## 2020-09-13 NOTE — Chronic Care Management (AMB) (Signed)
  Chronic Care Management   Note  09/13/2020 Name: Destiny Ballard MRN: [id number] DOB: 02-23-1951  Destiny Ballard is a 70 y.o. year old female who is a primary care patient of Copland, Gay Filler, MD. Destiny Ballard is currently enrolled in care management services. An additional referral for Pharm D  was placed.   Follow up plan: Unsuccessful telephone outreach attempt made. A HIPAA compliant phone message was left for the patient providing contact information and requesting a return call.  The care management team will reach out to the patient again over the next 5 days.  If patient returns call to provider office, please advise to call [facility]  at [address], [place], [place], [place] [address] Direct Dial: [phone] [email or web address]: [email or web address]

## 2020-09-26 ENCOUNTER — Telehealth: Payer: Medicare Other

## 2020-09-26 ENCOUNTER — Telehealth: Payer: Self-pay

## 2020-09-26 NOTE — Telephone Encounter (Signed)
  Chronic Care Management   Outreach Note  09/26/2020 Name: Destiny Ballard MRN: [id number] DOB: 06/22/1951  Referred by: Darreld Mclean, MD Reason for referral : Chronic Care Management (RNCM follow up-COPD, DM)   An unsuccessful telephone outreach was attempted today. The patient was referred to the case management team for assistance with care management and care coordination.   Follow Up Plan: The care management team will reach out to the patient again over the next 30 days.   Thea Silversmith, RN, MSN, BSN, CCM Care Management Coordinator [facility] [phone]

## 2020-09-28 ENCOUNTER — Telehealth: Payer: Self-pay | Admitting: *Deleted

## 2020-09-28 NOTE — Chronic Care Management (AMB) (Signed)
  Care Management   Note  09/28/2020 Name: Destiny Ballard MRN: [id number] DOB: [birth year]  Destiny Ballard is a 70 y.o. year old female who is a primary care patient of Copland, Gay Filler, MD and is actively engaged with the care management team. I reached out to [name] by phone today to assist with re-scheduling a follow up visit with the RN Case Manager  Follow up plan: Unsuccessful telephone outreach attempt made. A HIPAA compliant phone message was left for the patient providing contact information and requesting a return call.  The care management team will reach out to the patient again over the next 7 days.  If patient returns call to provider office, please advise to call [place] at [phone].  [place] Management

## 2020-10-02 ENCOUNTER — Ambulatory Visit (INDEPENDENT_AMBULATORY_CARE_PROVIDER_SITE_OTHER): Payer: Medicare Other | Admitting: Pharmacist

## 2020-10-02 DIAGNOSIS — E785 Hyperlipidemia, unspecified: Secondary | ICD-10-CM

## 2020-10-02 DIAGNOSIS — E119 Type 2 diabetes mellitus without complications: Secondary | ICD-10-CM

## 2020-10-02 DIAGNOSIS — J209 Acute bronchitis, unspecified: Secondary | ICD-10-CM

## 2020-10-02 DIAGNOSIS — I10 Essential (primary) hypertension: Secondary | ICD-10-CM

## 2020-10-02 DIAGNOSIS — J440 Chronic obstructive pulmonary disease with acute lower respiratory infection: Secondary | ICD-10-CM

## 2020-10-02 DIAGNOSIS — F418 Other specified anxiety disorders: Secondary | ICD-10-CM

## 2020-10-02 DIAGNOSIS — I25118 Atherosclerotic heart disease of native coronary artery with other forms of angina pectoris: Secondary | ICD-10-CM

## 2020-10-02 NOTE — Progress Notes (Deleted)
Chronic Care Management Pharmacy Note  10/02/2020 Name:  Destiny Ballard MRN:  [id number] DOB:  01/08/1951  Subjective: Destiny Ballard is an 70 y.o. year old female who is a primary patient of Copland, Gay Filler, MD.  The CCM team was consulted for assistance with disease management and care coordination needs.    {CCMTELEPHONEFACETOFACE:21091510} for {CCMINITIALFOLLOWUPCHOICE:21091511} in response to provider referral for pharmacy case management and/or care coordination services.   Consent to Services:  {CCMCONSENTOPTIONS:25074}  Patient Care Team: Copland, Gay Filler, MD as PCP - General (Family Medicine) Brien Few, MD as Consulting Physician (Obstetrics and Gynecology) Luretha Rued, RN as Case Manager  Recent office visits: 09/04/2020 - PCP (Dr Lorelei Pont) F/U COVID Pneumonia with recent hospital stay Admit Date:08/21/2020 Discharge date:08/23/2020. BP noted to be elevated but patient thinks that she ran out of losartan perhaps 2 weeks ago. Rx refilled.  Rechecked CBC and CMP. Encouraged her to get COVID-19 series. Flu vaccine given. Ordered chest x-ray for 2 to 3 weeks  Recent consult visits: 09/05/2020 - Endo (Dr Kelton Pillar) -A1c noted to having increased from 8.2% to 9.3% due to dietary indiscretions and low adherene to Trulicity (had been out for 2 months). Medication recommendations per Dr Kelton Pillar: Continue Metformin 500 mg 2 tablet twice daily and Farxiga 10 mg daily in the morning. Increased Basaglar to 30  units daily and increased Trulicity to 1.5  mg weekly.    Hospital visits: Medication Reconciliation was completed by comparing discharge summary, patient's EMR and Pharmacy list, and upon discussion with patient.  Admitted to the hospital on 08/21/20 due to COVID pneumonia. Discharge date was 08/23/2020. Discharged from [place]?Medications Started at [facility] Discharge:?? -started Outpatient Redemsivir due to Covid PNA and doxamethasone 72m  daily for 5 days Medication Changes at Hospital Discharge: -Changed - none Medications Discontinued at Hospital Discharge: -Stopped methocarbamol, triamcinolone 01.8%cream and Trulicity 04.03FVweekly due to patient no longer taking / med list update.  Medications that remain the same after Hospital Discharge:??  -All other medications will remain the same.    Objective:  Lab Results  Component Value Date   CREATININE 0.62 09/04/2020   CREATININE 0.82 08/23/2020   CREATININE 0.68 08/22/2020    Lab Results  Component Value Date   HGBA1C 9.3 (H) 08/22/2020   Last diabetic Eye exam: No results found for: HMDIABEYEEXA  Last diabetic Foot exam: No results found for: HMDIABFOOTEX      Component Value Date/Time   CHOL 145 09/09/2019 1128   CHOL 200 (H) 05/19/2018 0939   TRIG 366 (H) 08/21/2020 1653   HDL 42.60 09/09/2019 1128   HDL 37 (L) 05/19/2018 0939   CHOLHDL 3 09/09/2019 1128   VLDL 72.6 (H) 09/09/2019 1128   LDLCALC Comment 05/19/2018 0939   LDLDIRECT 64.0 09/09/2019 1128    Hepatic Function Latest Ref Rng & Units 09/04/2020 08/23/2020 08/22/2020  Total Protein 6.0 - 8.3 g/dL 6.6 7.0 7.1  Albumin 3.5 - 5.2 g/dL 3.7 3.2(L) 3.2(L)  AST 0 - 37 U/L 19 16 22   ALT 0 - 35 U/L 28 28 37  Alk Phosphatase 39 - 117 U/L 101 79 92  Total Bilirubin 0.2 - 1.2 mg/dL 0.6 0.4 0.7  Bilirubin, Direct 0.0 - 0.2 mg/dL - - -    Lab Results  Component Value Date/Time   TSH 2.361 01/27/2016 02:52 AM   TSH 0.83 03/24/2014 11:14 AM   TSH 0.88 09/10/2013 02:41 PM    CBC  Latest Ref Rng & Units 09/04/2020 08/23/2020 08/22/2020  WBC 4.0 - 10.5 K/uL 9.0 9.7 8.1  Hemoglobin 12.0 - 15.0 g/dL 12.1 12.4 12.6  Hematocrit 36.0 - 46.0 % 37.6 39.9 40.0  Platelets 150.0 - 400.0 K/uL 236.0 230 204    Lab Results  Component Value Date/Time   VD25OH 26 (L) 09/16/2011 10:58 AM    Clinical ASCVD: {YES/NO:21197} The 10-year ASCVD risk score Mikey Bussing DC Jr., et al., 2013) is: 35.4%   Values used to calculate  the score:     Age: 32 years     Sex: Female     Is Non-Hispanic African American: No     Diabetic: Yes     Tobacco smoker: No     Systolic Blood Pressure: 876 mmHg     Is BP treated: Yes     HDL Cholesterol: 42.6 mg/dL     Total Cholesterol: 145 mg/dL    Other: (CHADS2VASc if Afib, PHQ9 if depression, MMRC or CAT for COPD, ACT, DEXA)  Social History   Tobacco Use  Smoking Status Passive Smoke Exposure - Never Smoker  Smokeless Tobacco Never Used   BP Readings from Last 3 Encounters:  09/05/20 (!) 160/70  09/04/20 (!) 166/68  08/25/20 (!) 147/74   Pulse Readings from Last 3 Encounters:  09/05/20 92  09/04/20 99  08/25/20 71   Wt Readings from Last 3 Encounters:  09/05/20 178 lb 6 oz (80.9 kg)  09/04/20 178 lb (80.7 kg)  08/22/20 176 lb 2.4 oz (79.9 kg)    Assessment: Review of patient past medical history, allergies, medications, health status, including review of consultants reports, laboratory and other test data, was performed as part of comprehensive evaluation and provision of chronic care management services.   SDOH:  (Social Determinants of Health) assessments and interventions performed:    CCM Care Plan  Allergies  Allergen Reactions  . Prednisone Other (See Comments)    REACTION: mood swings, nightmares. "Shot doesn't bother me, reaction is just with the pill" she states she has had the steroid injections before. From our records methylprednisone was given to her in 2013 without any complications.    Medications Reviewed Today    Reviewed by Jacqualin Combes, CMA (Certified Medical Assistant) on 09/05/20 at 0757  Med List Status: <None>  Medication Order Taking? Sig Documenting Provider Last Dose Status Informant  albuterol (VENTOLIN HFA) 108 (90 Base) MCG/ACT inhaler [id number] Yes Inhale 2 puffs into the lungs every 8 (eight) hours as needed for wheezing or shortness of breath. Jonetta Osgood, MD Taking Active   aspirin EC 81 MG tablet [id number]  Yes Take 81 mg by mouth daily. [provider] Taking Active Self  benzonatate (TESSALON) 200 MG capsule [id number] Yes Take 1 capsule (200 mg total) by mouth 3 (three) times daily as needed for cough. Jonetta Osgood, MD Taking Active   Cholecalciferol (VITAMIN D3) 1000 UNITS CAPS [id number] Yes Take 1,000 Units by mouth daily. [provider] Taking Active Self  dapagliflozin propanediol (FARXIGA) 10 MG TABS tablet [id number] Yes Take 1 tablet (10 mg total) by mouth daily. Shamleffer, Melanie Crazier, MD Taking Active Self  Fexofenadine HCl (ALLEGRA ALLERGY PO) [id number] Yes Take 1 tablet by mouth daily. [provider] Taking Active Self  FLUoxetine (PROZAC) 40 MG capsule [id number] Yes TAKE 1 CAPSULE BY MOUTH EVERY DAY  Patient taking differently: Take 40 mg by mouth daily.   Copland, Gay Filler, MD Taking Active   fluticasone (FLONASE) 50 MCG/ACT nasal  spray [id number] Yes SPRAY 2 SPRAYS INTO EACH NOSTRIL EVERY DAY  Patient taking differently: Place 2 sprays into both nostrils daily as needed for allergies.   Collene Gobble, MD Taking Active   furosemide (LASIX) 20 MG tablet [id number] Yes TAKE 1 TABLET BY MOUTH EVERY DAY  Patient taking differently: Take 20 mg by mouth daily.   Burtis Junes, NP Taking Active   gabapentin (NEURONTIN) 300 MG capsule [id number] Yes Take 2 capsules (600 mg total) by mouth 2 (two) times daily. Copland, Gay Filler, MD Taking Active   hydrALAZINE (APRESOLINE) 25 MG tablet [id number] Yes Patient needs to schedule appointment for any future refills.  Please call [phone].  1st attempt.  Patient taking differently: Take 25 mg by mouth 2 (two) times daily.   Burtis Junes, NP Taking Active   hydrOXYzine (ATARAX/VISTARIL) 25 MG tablet [id number] Yes TAKE 1/2 TO 1 TABLET BY MOUTH EVERY 8 HOURS AS NEEDED FOR ITCHING  Patient taking differently: Take 12.5-25 mg by mouth 2 (two) times daily.   Copland, Gay Filler, MD Taking Active   Insulin  Glargine [facility] KWIKPEN) 100 UNIT/ML [id number] Yes Inject 0.24 mLs (24 Units total) into the skin daily.  Patient taking differently: Inject 28 Units into the skin daily.   Shamleffer, Melanie Crazier, MD Taking Active   Insulin Pen Needle 32G X 4 MM MISC [id number] Yes 1 Device by Does not apply route as directed. Shamleffer, Melanie Crazier, MD Taking Active Self  losartan (COZAAR) 100 MG tablet [id number] Yes Take 1 tablet (100 mg total) by mouth daily. Copland, Gay Filler, MD Taking Active   magnesium oxide (MAG-OX) 400 MG tablet [id number] Yes Take 400 mg by mouth daily. [provider] Taking Active Self  metFORMIN (GLUCOPHAGE-XR) 500 MG 24 hr tablet [id number] Yes Take 2 tablets (1,000 mg total) by mouth 2 (two) times daily. Shamleffer, Melanie Crazier, MD Taking Active Self  metoprolol succinate (TOPROL-XL) 100 MG 24 hr tablet [id number] Yes TAKE 1 TABLET BY MOUTH EVERY DAY  Patient taking differently: Take 100 mg by mouth daily.   Copland, Gay Filler, MD Taking Active   montelukast (SINGULAIR) 10 MG tablet [id number] Yes TAKE 1 TABLET BY MOUTH EVERY DAY AS NEEDED FOR ALLERGIES  Patient taking differently: Take 10 mg by mouth at bedtime.   Copland, Gay Filler, MD Taking Active   nitroGLYCERIN (NITROSTAT) 0.4 MG SL tablet [id number] Yes Place 1 tablet (0.4 mg total) under the tongue every 5 (five) minutes as needed for chest pain. Copland, Gay Filler, MD Taking Active Self           Med Note Luciano Cutter   Tue Aug 22, 2020 10:43 AM)    Potassium 99 MG TABS [id number] Yes Take 1 tablet by mouth. [provider] Taking Active Self  rosuvastatin (CRESTOR) 20 MG tablet [id number] Yes TAKE 1 TABLET BY MOUTH EVERY DAY  Patient taking differently: Take 20 mg by mouth every evening.   Copland, Gay Filler, MD Taking Active   Tiotropium Bromide Monohydrate (SPIRIVA RESPIMAT) 2.5 MCG/ACT AERS [id number] Yes Inhale 2 puffs into the lungs daily.  Patient taking differently: Inhale 2 puffs  into the lungs daily as needed (shortness of breath).   Colon Branch, MD Taking Active   traZODone (DESYREL) 50 MG tablet [id number] Yes TAKE 1/2 TO 1 TABLET BY MOUTH AT BEDTIME AS NEEDED FOR SLEEP  Patient taking differently: Take 25-50 mg by mouth at bedtime as needed for sleep.   Copland, Gay Filler,  MD Taking Active Self  zinc gluconate 50 MG tablet [id number] Yes Take 50 mg by mouth daily. [provider] Taking Active Self          Patient Active Problem List   Diagnosis Date Noted  . Pneumonia due to COVID-19 virus 08/21/2020  . Hyperlipidemia associated with type 2 diabetes mellitus ([place]) 08/21/2020  . Uncontrolled type 2 diabetes mellitus with complication, with long-term current use of insulin ([place]) 03/14/2020  . Type 2 diabetes mellitus with hyperglycemia, with long-term current use of insulin ([place]) 12/10/2019  . Type 2 diabetes mellitus with diabetic polyneuropathy, with long-term current use of insulin ([place]) 12/10/2019  . Obesity (BMI 30-39.9) 08/24/2018  . Depression with anxiety 08/24/2018  . CAD (coronary artery disease) 08/24/2018  . Abnormal cardiovascular stress test 08/24/2018  . Lobar pneumonia, unspecified organism ([place]) 11/13/2017  . Precordial chest pain   . Sepsis ([place]) 09/18/2017  . Chronic diastolic CHF (congestive heart failure) ([place]) 09/18/2017  . Diabetes mellitus ([place]) 09/08/2017  . Left hip pain 10/28/2016  . Chronic rhinitis 08/07/2016  . COPD with acute exacerbation ([place]) 04/25/2016  . Hypertension associated with diabetes ([place]) 01/30/2016  . Hypokalemia 01/27/2016  . HLD (hyperlipidemia)   . Type 2 diabetes mellitus without complication, without long-term current use of insulin ([place])   . OSA (obstructive sleep apnea) 05/06/2014  . Dyspnea on exertion 09/02/2012  . Chest pain 12/10/2011  . Chronic cough 05/11/2010  . GERD 06/28/2008    Immunization History  Administered Date(s) Administered  . Fluad Quad(high Dose 65+) 09/04/2020  .  Influenza Whole 04/14/2010  . Influenza, High Dose Seasonal PF 04/03/2017, 05/11/2018, 03/05/2019  . Influenza,inj,Quad PF,6+ Mos 05/10/2013, 04/05/2014, 05/22/2015, 04/25/2016  . Pneumococcal Conjugate-13 07/24/2016  . Pneumococcal Polysaccharide-23 04/05/2014, 05/22/2015  . Tdap 07/11/2014, 03/05/2019  . Zoster Recombinat (Shingrix) 03/05/2019    Conditions to be addressed/monitored: CHF, CAD, HTN, HLD, COPD, DMII, Anxiety and Depression  There are no care plans that you recently modified to display for this patient.   Medication Assistance: {MEDASSISTANCEINFO:25044}  Patient's preferred pharmacy is:  [id number] [place] [address]Phone: [phone]: [phone] Uses pill box? {Yes or If no, why not?:20788} Pt endorses ***% compliance  Follow Up:  {FOLLOWUP:24991}  Plan: {CM FOLLOW UP PLAN:25073}  SIG***

## 2020-10-03 ENCOUNTER — Telehealth: Payer: Self-pay | Admitting: Family Medicine

## 2020-10-03 MED ORDER — VALSARTAN 160 MG PO TABS: 160.0000 mg | ORAL_TABLET | Freq: Every day | ORAL | 3 refills | Status: DC

## 2020-10-03 NOTE — Telephone Encounter (Signed)
-----   Message from [name], PharmD sent at 10/02/2020 11:00 AM EDT ----- Regarding: Losartan back order Good Morning Dr Lorelei Pont,  I had a CCM phone visit with Ms. Eggleton today and was concerned because losartan 100mg  had not been filled yet per refill history. Patient was not sure why the medication had not been filled. I contacted [facility] and they reported that losartan is on backorder.  Would you consider change to valsartan 160mg  daily since it is currently available?  [name], PharmD Clinical Pharmacist [facility]

## 2020-10-03 NOTE — Telephone Encounter (Signed)
Called pt and LMOM- will change her from losartan to valsartan at least temporarily due to backorder

## 2020-10-05 ENCOUNTER — Telehealth: Payer: Self-pay | Admitting: Pharmacist

## 2020-10-05 NOTE — Telephone Encounter (Signed)
Called [facility] to see if patient had picked up Valsartan yet. She had not so I called patient to explain that losartan was on backorder per [facility] and that Dr Lorelei Pont had sent in valsartan 160mg  daily.  Patient reported she will pick up today.  She is to call if she has questions or experiences any issues with valsartan.

## 2020-10-06 NOTE — Chronic Care Management (AMB) (Signed)
  Care Management   Note  10/06/2020 Name: MARINDA TYER MRN: [id number] DOB: 1951/06/25  KATHY WAHID is a 71 y.o. year old female who is a primary care patient of Copland, Gay Filler, MD and is actively engaged with the care management team. I reached out to [name] by phone today to assist with re-scheduling a follow up visit with the RN Case Manager.  Follow up plan: Telephone appointment with care management team member scheduled for:10/10/2020  [place] [facility]

## 2020-10-09 NOTE — Chronic Care Management (AMB) (Signed)
Duplicate CCM note entry - see other CCM note for 10/02/2020 visit

## 2020-10-09 NOTE — Chronic Care Management (AMB) (Signed)
Chronic Care Management Pharmacy Note  10/13/2020 Name:  Destiny Ballard MRN:  [id number] DOB:  12-22-1950  Subjective: Destiny Ballard is an 70 y.o. year old female who is a primary patient of Copland, Gay Filler, MD.  The CCM team was consulted for assistance with disease management and care coordination needs.    Engaged with patient by telephone for follow up visit in response to provider referral for pharmacy case management and/or care coordination services.   Consent to Services:  The patient was given information about Chronic Care Management services, agreed to services, and gave verbal consent prior to initiation of services.  Please see initial visit note for detailed documentation.   Patient Care Team: Copland, Gay Filler, MD as PCP - General (Family Medicine) Brien Few, MD as Consulting Physician (Obstetrics and Gynecology) Luretha Rued, RN as Case Manager [name], PharmD (Pharmacist)  Recent office visits: 09/04/2020 - PCP (Dr Lorelei Pont) Seen for f/u hospitalization for COVID 19. No medication changes. CBC and CMP checked. Flu vaccine administered.   Recent consult visits: 09/05/2020 - Endo (Dr Kelton Pillar) Seen for f/u type DM. A1c noted ton have increased over last 6 months form 8.2% to 9.3%. Goal is < 7.0%. Patient reported she had been out for Trulicity. She was instructed to restart Trulicity at increased dose of 1.59m weekly, increase Basaglar to 30 units daily. Instructed to continue metformin 5060m- take 2 tabs twice daily and continue Farxiga 1052mam.   Also instructed to check BF twice daily. F/U planned for 4 months.   Hospital visits: Medication Reconciliation was completed by comparing discharge summary, patient's EMR and Pharmacy list, and upon discussion with patient.  Admitted to the hospital on 08/21/2020 due to COVID 19 pneumonia. Discharge date was 08/23/2020. [facility] Remdesivir infusion X 2 and dexamethasone 6mg12mtake 1 tablet daily for 5 days.  Medication Changes at Hospital Discharge: -Changed none  Medications Discontinued at Hospital Discharge: -Stopped methocarbamol, triamcinolone 0.1%6.3%am and Trulicity 0.753.35KTweekly   Medications that remain the same after Hospital Discharge:??  -All other medications will remain the same.    Objective:  Lab Results  Component Value Date   CREATININE 0.62 09/04/2020   CREATININE 0.82 08/23/2020   CREATININE 0.68 08/22/2020    Lab Results  Component Value Date   HGBA1C 9.3 (H) 08/22/2020   Last diabetic Eye exam: 02/2020  Last diabetic Foot exam: 12/10/2019      Component Value Date/Time   CHOL 145 09/09/2019 1128   CHOL 200 (H) 05/19/2018 0939   TRIG 366 (H) 08/21/2020 1653   HDL 42.60 09/09/2019 1128   HDL 37 (L) 05/19/2018 0939   CHOLHDL 3 09/09/2019 1128   VLDL 72.6 (H) 09/09/2019 1128   LDLCALC Comment 05/19/2018 0939   LDLDIRECT 64.0 09/09/2019 1128    Hepatic Function Latest Ref Rng & Units 09/04/2020 08/23/2020 08/22/2020  Total Protein 6.0 - 8.3 g/dL 6.6 7.0 7.1  Albumin 3.5 - 5.2 g/dL 3.7 3.2(L) 3.2(L)  AST 0 - 37 U/L 19 16 22   ALT 0 - 35 U/L 28 28 37  Alk Phosphatase 39 - 117 U/L 101 79 92  Total Bilirubin 0.2 - 1.2 mg/dL 0.6 0.4 0.7  Bilirubin, Direct 0.0 - 0.2 mg/dL - - -    Lab Results  Component Value Date/Time   TSH 2.361 01/27/2016 02:52 AM   TSH 0.83 03/24/2014 11:14  AM   TSH 0.88 09/10/2013 02:41 PM    CBC Latest Ref Rng & Units 09/04/2020 08/23/2020 08/22/2020  WBC 4.0 - 10.5 K/uL 9.0 9.7 8.1  Hemoglobin 12.0 - 15.0 g/dL 12.1 12.4 12.6  Hematocrit 36.0 - 46.0 % 37.6 39.9 40.0  Platelets 150.0 - 400.0 K/uL 236.0 230 204    Lab Results  Component Value Date/Time   VD25OH 26 (L) 09/16/2011 10:58 AM    Clinical ASCVD: Yes  The 10-year ASCVD risk score Mikey Bussing DC Jr., et al., 2013) is: 35.4%   Values used to calculate the score:     Age:  12 years     Sex: Female     Is Non-Hispanic African American: No     Diabetic: Yes     Tobacco smoker: No     Systolic Blood Pressure: 329 mmHg     Is BP treated: Yes     HDL Cholesterol: 42.6 mg/dL     Total Cholesterol: 145 mg/dL     Social History   Tobacco Use  Smoking Status Passive Smoke Exposure - Never Smoker  Smokeless Tobacco Never Used   BP Readings from Last 3 Encounters:  09/05/20 (!) 160/70  09/04/20 (!) 166/68  08/25/20 (!) 147/74   Pulse Readings from Last 3 Encounters:  09/05/20 92  09/04/20 99  08/25/20 71   Wt Readings from Last 3 Encounters:  09/05/20 178 lb 6 oz (80.9 kg)  09/04/20 178 lb (80.7 kg)  08/22/20 176 lb 2.4 oz (79.9 kg)    Assessment: Review of patient past medical history, allergies, medications, health status, including review of consultants reports, laboratory and other test data, was performed as part of comprehensive evaluation and provision of chronic care management services.   SDOH:  (Social Determinants of Health) assessments and interventions performed:  SDOH Interventions   Flowsheet Row Most Recent Value  SDOH Interventions   Financial Strain Interventions Other (Comment)  [CCM Clinical pharamcist working with patient to identify cost assistance through discount cards since she has commercial pharmacy benefits and if needed and eligible, through manufacturer assisntace program]      CCM Care Plan  Allergies  Allergen Reactions  . Prednisone Other (See Comments)    REACTION: mood swings, nightmares. "Shot doesn't bother me, reaction is just with the pill" she states she has had the steroid injections before. From our records methylprednisone was given to her in 2013 without any complications.    Medications Reviewed Today    Reviewed by Luretha Rued, RN (Registered Nurse) on 10/10/20 at 53  Med List Status: <None>  Medication Order Taking? Sig Documenting Provider Last Dose Status Informant  albuterol (VENTOLIN  HFA) 108 (90 Base) MCG/ACT inhaler [id number] Yes Inhale 2 puffs into the lungs every 8 (eight) hours as needed for wheezing or shortness of breath. Jonetta Osgood, MD Taking Active   aspirin EC 81 MG tablet [id number] Yes Take 81 mg by mouth daily. [provider] Taking Active Self  benzonatate (TESSALON) 200 MG capsule [id number] Yes Take 1 capsule (200 mg total) by mouth 3 (three) times daily as needed for cough. Ghimire, Henreitta Leber, MD Taking Active   calcium carbonate (TUMS EX) 750 MG chewable tablet [id number] Yes Chew 1 tablet by mouth daily as needed for heartburn. [provider] Taking Active   Cholecalciferol (VITAMIN D3) 1000 UNITS CAPS [id number] Yes Take 1,000 Units by mouth daily. [provider] Taking Active Self  dapagliflozin propanediol (FARXIGA) 10 MG TABS tablet [id number]  Yes Take 1 tablet (10 mg total) by mouth daily. Shamleffer, Melanie Crazier, MD Taking Active Self  Dulaglutide (TRULICITY) 1.5 QA/4.4LP SOPN [id number] Yes Inject 1.5 mg into the skin once a week. Shamleffer, Melanie Crazier, MD Taking Active   Fexofenadine HCl [facility] ALLERGY PO) [id number] Yes Take 1 tablet by mouth daily. [provider] Taking Active Self  FLUoxetine (PROZAC) 40 MG capsule [id number] Yes TAKE 1 CAPSULE BY MOUTH EVERY DAY  Patient taking differently: Take 40 mg by mouth daily.   Copland, Gay Filler, MD Taking Active   fluticasone (FLONASE) 50 MCG/ACT nasal spray [id number] Yes SPRAY 2 SPRAYS INTO EACH NOSTRIL EVERY DAY  Patient taking differently: Place 2 sprays into both nostrils daily as needed for allergies.   Collene Gobble, MD Taking Active   furosemide (LASIX) 20 MG tablet [id number] Yes TAKE 1 TABLET BY MOUTH EVERY DAY  Patient taking differently: Take 20 mg by mouth daily.   Burtis Junes, NP Taking Active   gabapentin (NEURONTIN) 300 MG capsule [id number] Yes Take 2 capsules (600 mg total) by mouth 2 (two) times daily. Copland, Gay Filler, MD Taking  Active   hydrALAZINE (APRESOLINE) 25 MG tablet [id number] Yes Patient needs to schedule appointment for any future refills.  Please call [phone].  1st attempt.  Patient taking differently: Take 25 mg by mouth 2 (two) times daily.   Burtis Junes, NP Taking Active Self  hydrOXYzine (ATARAX/VISTARIL) 25 MG tablet [id number] Yes TAKE 1/2 TO 1 TABLET BY MOUTH EVERY 8 HOURS AS NEEDED FOR ITCHING  Patient taking differently: Take 12.5-25 mg by mouth every 8 (eight) hours as needed for itching.   Copland, Gay Filler, MD Taking Active   Insulin Glargine [facility] KWIKPEN) 100 UNIT/ML [id number] Yes Inject 30 Units into the skin daily. Shamleffer, Melanie Crazier, MD Taking Active   Insulin Pen Needle 32G X 4 MM MISC [id number]  1 Device by Does not apply route daily. Shamleffer, Melanie Crazier, MD  Active   magnesium oxide (MAG-OX) 400 MG tablet [id number] Yes Take 400 mg by mouth daily. [provider] Taking Active Self  metFORMIN (GLUCOPHAGE-XR) 500 MG 24 hr tablet [id number] Yes Take 2 tablets (1,000 mg total) by mouth 2 (two) times daily. Shamleffer, Melanie Crazier, MD Taking Active Self  metoprolol succinate (TOPROL-XL) 100 MG 24 hr tablet [id number] Yes TAKE 1 TABLET BY MOUTH EVERY DAY  Patient taking differently: Take 100 mg by mouth daily.   Copland, Gay Filler, MD Taking Active   montelukast (SINGULAIR) 10 MG tablet [id number] Yes TAKE 1 TABLET BY MOUTH EVERY DAY AS NEEDED FOR ALLERGIES  Patient taking differently: Take 10 mg by mouth at bedtime.   Copland, Gay Filler, MD Taking Active   nitroGLYCERIN (NITROSTAT) 0.4 MG SL tablet [id number] Yes Place 1 tablet (0.4 mg total) under the tongue every 5 (five) minutes as needed for chest pain. Copland, Gay Filler, MD Taking Active Self           Med Note Luciano Cutter   Tue Aug 22, 2020 10:43 AM)    Potassium 99 MG TABS [id number] Yes Take 1 tablet by mouth daily. [provider] Taking Active Self  rosuvastatin (CRESTOR) 20 MG  tablet [id number] Yes TAKE 1 TABLET BY MOUTH EVERY DAY  Patient taking differently: Take 20 mg by mouth every evening.   Copland, Gay Filler, MD Taking Active   Tiotropium Bromide Monohydrate (SPIRIVA RESPIMAT) 2.5 MCG/ACT AERS [id number] Yes Inhale 2 puffs into the lungs daily.  Patient taking differently: Inhale 2 puffs into the lungs daily as needed (shortness of breath).   Colon Branch, MD Taking Active   traZODone (DESYREL) 50 MG tablet [id number] Yes TAKE 1/2 TO 1 TABLET BY MOUTH AT BEDTIME AS NEEDED FOR SLEEP  Patient taking differently: Take 25-50 mg by mouth at bedtime as needed for sleep.   Copland, Gay Filler, MD Taking Active Self  valsartan (DIOVAN) 160 MG tablet [id number] Yes Take 1 tablet (160 mg total) by mouth daily. Copland, Gay Filler, MD Taking Active   zinc gluconate 50 MG tablet [id number] Yes Take 50 mg by mouth daily. [provider] Taking Active Self          Patient Active Problem List   Diagnosis Date Noted  . Pneumonia due to COVID-19 virus 08/21/2020  . Hyperlipidemia associated with type 2 diabetes mellitus ([place]) 08/21/2020  . Uncontrolled type 2 diabetes mellitus with complication, with long-term current use of insulin ([place]) 03/14/2020  . Type 2 diabetes mellitus with hyperglycemia, with long-term current use of insulin ([place]) 12/10/2019  . Type 2 diabetes mellitus with diabetic polyneuropathy, with long-term current use of insulin ([place]) 12/10/2019  . Obesity (BMI 30-39.9) 08/24/2018  . Depression with anxiety 08/24/2018  . CAD (coronary artery disease) 08/24/2018  . Abnormal cardiovascular stress test 08/24/2018  . Lobar pneumonia, unspecified organism ([place]) 11/13/2017  . Precordial chest pain   . Sepsis ([place]) 09/18/2017  . Chronic diastolic CHF (congestive heart failure) ([place]) 09/18/2017  . Diabetes mellitus ([place]) 09/08/2017  . Left hip pain 10/28/2016  . Chronic rhinitis 08/07/2016  . COPD with acute exacerbation ([place]) 04/25/2016  . Hypertension  associated with diabetes ([place]) 01/30/2016  . Hypokalemia 01/27/2016  . HLD (hyperlipidemia)   . Type 2 diabetes mellitus without complication, without long-term current use of insulin ([place])   . OSA (obstructive sleep apnea) 05/06/2014  . Dyspnea on exertion 09/02/2012  . Chest pain 12/10/2011  . Chronic cough 05/11/2010  . GERD 06/28/2008    Immunization History  Administered Date(s) Administered  . Fluad Quad(high Dose 65+) 09/04/2020  . Influenza Whole 04/14/2010  . Influenza, High Dose Seasonal PF 04/03/2017, 05/11/2018, 03/05/2019  . Influenza,inj,Quad PF,6+ Mos 05/10/2013, 04/05/2014, 05/22/2015, 04/25/2016  . Pneumococcal Conjugate-13 07/24/2016  . Pneumococcal Polysaccharide-23 04/05/2014, 05/22/2015  . Tdap 07/11/2014, 03/05/2019  . Zoster Recombinat (Shingrix) 03/05/2019    Conditions to be addressed/monitored: CHF, CAD, HTN, HLD, COPD, DMII, Anxiety, Depression and GERD    Problem: Hypertension (Hypertension)   Priority: High    Goal: Hypertension Monitored   Note:   Current Barriers:  . Unable to achieve control of hypertension (high blood pressure)  . Pharmacy unable to provide losartan due to on backorder  Pharmacist Clinical Goal(s):  [name] Over the next 90 days, patient will achieve adherence to monitoring guidelines and medication adherence to achieve therapeutic efficacy . achieve control of blood pressure as evidenced by home BP readings < 140/90  through collaboration with PharmD and provider.   Interventions: . 1:1 collaboration with Copland, Gay Filler, MD regarding development and update of comprehensive plan of care as evidenced by provider attestation and co-signature . Inter-disciplinary care team collaboration (see longitudinal plan of care) . Comprehensive medication review performed; medication list updated in electronic medical record  Hypertension, uncontrolled: [name] Uncontrolled current treatment: losartan 114m daily; metoprolol succinate 1062m daily; hydralazine 25105mwice a day  . Current home readings: 160/70 and 166/68 . Denies hypotensive/hypertensive symptoms . Educated on BP goals and improtance of  obtaining BP goals to decrease risk of stroke and renal disease Collaborated with provider and pharmacy to ensure patient has needed medications. Losartan was changed to valsartan 181m daily by provider.   Patient Goals/Self-Care Activities . Over the next 180 days, patient will:  Focus on medication adherence by take all BP medications on a regular basis,  -check blood pressure 2 to 3 times per week, document, and provide at future appointments -collaborate with provider on medication access solutions  Follow Up Plan: Telephone follow up appointment with care management team member scheduled for:  4 weeks and The patient has been provided with contact information for the care management team and has been advised to call with any health related questions or concerns.     Problem: Disease Progression (COPD)   Note:   Current Barriers:  . Patient states Stiolto copay is high especially with other medications she takes that have brand / high copays  Pharmacist Clinical Goal(s):  .Marland KitchenOver the next 90 days, patient will  continue to adhere to current inhaler regimen and identify potential cost savings  through collaboration with PharmD and provider.   Interventions: . Identify cost saving opportunity for patient through manufacturer discount card (patient's drug benefits are with a commercial [employer] plan)  Chronic Obstructive Pulmonary Disease: . Controlled; current treatment: Spiriva 2.549m- 1 inhalation once a day; Albuterol inhaler as needed for shortness of breath and wheezing; montelukast 1045maily at bedtime; benzonatate 200m39m to 3 times a day as needed for cough.  . one exacerbation requiring treatment in the last 6 months (related to [place]OV[place]Recommended continue current regimen  Patient Goals/Self-Care  Activities . Over the next 90 days, patient will:  take medications as prescribed and collaborate with clinical pharmacist and provider on medication access solutions  Follow Up Plan:  Will call patient in 4 weeks to follow up on medication access issues      Problem: Glycemic Management (Diabetes, Type 2)   Note:   Current Barriers:  . Unable to independently afford treatment regimen . Unable to achieve control of type 2 diabetes  . Does not adhere to prescribed medication regimen  Pharmacist Clinical Goal(s):  . OvMarland Kitchenr the next 90 days, patient will achieve control of diabetes as evidenced by A1c < 7.0% . adhere to plan to optimize therapeutic regimen for diabetes as evidenced by report of adherence to recommended medication management changes . Identify potential medication savings opportunities  through collaboration with PharmD and provider.   Interventions: . Education provided on BG and A1c goals . Discussed importance of improved DM control to prevent compliations  Diabetes: . Uncontrolled current treatment:Farxiga 10mg4mly; metformin 500mg 45mke 2 tablets twice a day; Basaglar - inject 30 units daily; and Trulicity 1.5mg 3.5KTted WEEKLY  . Current glucose readings: fasting glucose: 150 to 170, post prandial glucose: 180 to 225 . Denies hypoglycemic/hyperglycemic symptoms . Current exercise: little - limited by back pain  . Educated on BG goals Collaborated with patient and provider to identify cost saving opportunities for diabetes regimen  Patient Goals/Self-Care Activities . Over the next 90 days, patient will:  take medications as prescribed and check glucose 2 to 3 times a day, document, and provide at future appointments      Medication Assistance: Reviewing options for either manufucturer assistance program or discount coupons  Patient's preferred pharmacy is:  [id number] [place] [address] [phone]  Fax: [phone]   Follow Up:  Patient agrees to Care Plan and Follow-up.  Plan: Telephone follow up appointment with care management team member scheduled for:  4 weeks  [name], PharmD Clinical Pharmacist [facility] [place] [facility]

## 2020-10-10 ENCOUNTER — Ambulatory Visit: Payer: Medicare Other

## 2020-10-10 DIAGNOSIS — J441 Chronic obstructive pulmonary disease with (acute) exacerbation: Secondary | ICD-10-CM

## 2020-10-10 DIAGNOSIS — I10 Essential (primary) hypertension: Secondary | ICD-10-CM

## 2020-10-10 DIAGNOSIS — F418 Other specified anxiety disorders: Secondary | ICD-10-CM

## 2020-10-10 DIAGNOSIS — I152 Hypertension secondary to endocrine disorders: Secondary | ICD-10-CM

## 2020-10-10 DIAGNOSIS — J440 Chronic obstructive pulmonary disease with acute lower respiratory infection: Secondary | ICD-10-CM | POA: Diagnosis not present

## 2020-10-10 DIAGNOSIS — E785 Hyperlipidemia, unspecified: Secondary | ICD-10-CM

## 2020-10-10 DIAGNOSIS — E119 Type 2 diabetes mellitus without complications: Secondary | ICD-10-CM | POA: Diagnosis not present

## 2020-10-10 DIAGNOSIS — Z794 Long term (current) use of insulin: Secondary | ICD-10-CM

## 2020-10-10 DIAGNOSIS — I25118 Atherosclerotic heart disease of native coronary artery with other forms of angina pectoris: Secondary | ICD-10-CM

## 2020-10-10 DIAGNOSIS — J209 Acute bronchitis, unspecified: Secondary | ICD-10-CM

## 2020-10-10 DIAGNOSIS — E1159 Type 2 diabetes mellitus with other circulatory complications: Secondary | ICD-10-CM | POA: Diagnosis not present

## 2020-10-10 DIAGNOSIS — E1165 Type 2 diabetes mellitus with hyperglycemia: Secondary | ICD-10-CM | POA: Diagnosis not present

## 2020-10-10 NOTE — Patient Instructions (Addendum)
Visit Information: Thank you for taking the time to speak with me today.  PATIENT GOALS: Goals Addressed            This Visit's Progress   . COMPLETED: [facility]) Set My Target A1C       Timeframe:  Long-Range Goal Priority:  High Start Date:   [id number]                          Expected End Date:     [id number]                 Follow Up Date 12/09/2020   - set target A1C; Target is < 7.0% (per Endo - Dr Kelton Pillar) current is 9.3% (08/22/2020)   Why is this important?   Your target A1C is decided together by you and your doctor.  It is based on several things like your age and other health issues.    Notes:     [name] Monitor and Manage My Blood Sugar       Timeframe:  Long-Range Goal Priority:  Medium Start Date:    08/30/20                        Expected End Date:    02/26/21                  Follow Up Date 11/14/2020   Patient Goals/Self-Care Activities - continue to check blood sugar at prescribed times and enter blood sugar readings and medication or insulin into daily log - continue to take medications as prescribed  - take the blood sugar log to all doctor visits - notify provider for sustained elevations or Blood sugars less than 70. - attend provider visits as scheduled and contact your provider with questions or concerns - continue to work with embedded clinic pharmacist for medication questions or concerns.  - view Emmi video on Diabetes and Hypertension   Why is this important?   Checking your blood sugar at home helps to keep it from getting very high or very low.  Writing the results in a diary or log helps the doctor know how to care for you.  Your blood sugar log should have the time, date and the results.  Also, write down the amount of insulin or other medicine that you take.  Other information, like what you ate, exercise done and how you were feeling, will also be helpful.     Notes:     . Track and Manage my Respiratory Symptoms       Timeframe:  Long-Range  Goal Priority:  High Start Date:   08/30/20                        Expected End Date: 02/27/21                       Follow Up Date 11/14/2020   Patient Goals/Self-Care Activities:  -keep all follow-up appointments. IF you do not have a follow up appointment scheduled please call to schedule.  - Call provider if in yellow zone 48 hours without improvement. - Discuss follow up plan with primary care for management of COPD.   - View Emmi video: "COPD: What Patients can do". Call if you have any questions.   Why is this important?   Tracking your symptoms and other information about your health helps  your doctor plan your care.  Write down the symptoms, the time of day, what you were doing and what medicine you are taking.  You will soon learn how to manage your symptoms.     Notes:    Patient verbalizes understanding of instructions provided today and agrees to view in [place].   The care management team will reach out to the patient again over the next 45 days.   Thea Silversmith, RN, MSN, BSN, CCM Care Management Coordinator [facility] [phone]

## 2020-10-10 NOTE — Chronic Care Management (AMB) (Signed)
Chronic Care Management   CCM RN Visit Note  10/10/2020 Name: Destiny Ballard MRN: [id number] DOB: 09/04/1950  Subjective: Destiny Ballard is a 70 y.o. year old female who is a primary care patient of Copland, Gay Filler, MD. The care management team was consulted for assistance with disease management and care coordination needs.    Engaged with patient by telephone for follow up visit in response to provider referral for case management and/or care coordination services.   Consent to Services:  The patient was given information about Chronic Care Management services, agreed to services, and gave verbal consent prior to initiation of services.  Please see initial visit note for detailed documentation.   Patient agreed to services and verbal consent obtained.   Assessment: Review of patient past medical history, allergies, medications, health status, including review of consultants reports, laboratory and other test data, was performed as part of comprehensive evaluation and provision of chronic care management services.   SDOH (Social Determinants of Health) assessments and interventions performed:    CCM Care Plan  Allergies  Allergen Reactions  . Prednisone Other (See Comments)    REACTION: mood swings, nightmares. "Shot doesn't bother me, reaction is just with the pill" she states she has had the steroid injections before. From our records methylprednisone was given to her in 2013 without any complications.    Outpatient Encounter Medications as of 10/10/2020  Medication Sig  . albuterol (VENTOLIN HFA) 108 (90 Base) MCG/ACT inhaler Inhale 2 puffs into the lungs every 8 (eight) hours as needed for wheezing or shortness of breath.  [name] aspirin EC 81 MG tablet Take 81 mg by mouth daily.  . benzonatate (TESSALON) 200 MG capsule Take 1 capsule (200 mg total) by mouth 3 (three) times daily as needed for cough.  . calcium carbonate (TUMS EX) 750 MG chewable tablet Chew 1 tablet by mouth  daily as needed for heartburn.  . Cholecalciferol (VITAMIN D3) 1000 UNITS CAPS Take 1,000 Units by mouth daily.  . dapagliflozin propanediol (FARXIGA) 10 MG TABS tablet Take 1 tablet (10 mg total) by mouth daily.  . Dulaglutide (TRULICITY) 1.5 ES/9.2ZR SOPN Inject 1.5 mg into the skin once a week.  [name] Fexofenadine HCl (ALLEGRA ALLERGY PO) Take 1 tablet by mouth daily.  [name] FLUoxetine (PROZAC) 40 MG capsule TAKE 1 CAPSULE BY MOUTH EVERY DAY (Patient taking differently: Take 40 mg by mouth daily.)  . fluticasone (FLONASE) 50 MCG/ACT nasal spray SPRAY 2 SPRAYS INTO EACH NOSTRIL EVERY DAY (Patient taking differently: Place 2 sprays into both nostrils daily as needed for allergies.)  . furosemide (LASIX) 20 MG tablet TAKE 1 TABLET BY MOUTH EVERY DAY (Patient taking differently: Take 20 mg by mouth daily.)  . gabapentin (NEURONTIN) 300 MG capsule Take 2 capsules (600 mg total) by mouth 2 (two) times daily.  . hydrALAZINE (APRESOLINE) 25 MG tablet Patient needs to schedule appointment for any future refills.  Please call [phone].  1st attempt. (Patient taking differently: Take 25 mg by mouth 2 (two) times daily.)  . hydrOXYzine (ATARAX/VISTARIL) 25 MG tablet TAKE 1/2 TO 1 TABLET BY MOUTH EVERY 8 HOURS AS NEEDED FOR ITCHING (Patient taking differently: Take 12.5-25 mg by mouth every 8 (eight) hours as needed for itching.)  . Insulin Glargine (BASAGLAR KWIKPEN) 100 UNIT/ML Inject 30 Units into the skin daily.  . magnesium oxide (MAG-OX) 400 MG tablet Take 400 mg by mouth daily.  . metFORMIN (GLUCOPHAGE-XR) 500 MG 24 hr tablet Take 2 tablets (1,000 mg  total) by mouth 2 (two) times daily.  . metoprolol succinate (TOPROL-XL) 100 MG 24 hr tablet TAKE 1 TABLET BY MOUTH EVERY DAY (Patient taking differently: Take 100 mg by mouth daily.)  . montelukast (SINGULAIR) 10 MG tablet TAKE 1 TABLET BY MOUTH EVERY DAY AS NEEDED FOR ALLERGIES (Patient taking differently: Take 10 mg by mouth at bedtime.)  . nitroGLYCERIN  (NITROSTAT) 0.4 MG SL tablet Place 1 tablet (0.4 mg total) under the tongue every 5 (five) minutes as needed for chest pain.  [name] Potassium 99 MG TABS Take 1 tablet by mouth daily.  . rosuvastatin (CRESTOR) 20 MG tablet TAKE 1 TABLET BY MOUTH EVERY DAY (Patient taking differently: Take 20 mg by mouth every evening.)  . Tiotropium Bromide Monohydrate (SPIRIVA RESPIMAT) 2.5 MCG/ACT AERS Inhale 2 puffs into the lungs daily. (Patient taking differently: Inhale 2 puffs into the lungs daily as needed (shortness of breath).)  . traZODone (DESYREL) 50 MG tablet TAKE 1/2 TO 1 TABLET BY MOUTH AT BEDTIME AS NEEDED FOR SLEEP (Patient taking differently: Take 25-50 mg by mouth at bedtime as needed for sleep.)  . valsartan (DIOVAN) 160 MG tablet Take 1 tablet (160 mg total) by mouth daily.  [name] zinc gluconate 50 MG tablet Take 50 mg by mouth daily.  . Insulin Pen Needle 32G X 4 MM MISC 1 Device by Does not apply route daily.   No facility-administered encounter medications on file as of 10/10/2020.    Patient Active Problem List   Diagnosis Date Noted  . Pneumonia due to COVID-19 virus 08/21/2020  . Hyperlipidemia associated with type 2 diabetes mellitus ([place]) 08/21/2020  . Uncontrolled type 2 diabetes mellitus with complication, with long-term current use of insulin ([place]) 03/14/2020  . Type 2 diabetes mellitus with hyperglycemia, with long-term current use of insulin ([place]) 12/10/2019  . Type 2 diabetes mellitus with diabetic polyneuropathy, with long-term current use of insulin ([place]) 12/10/2019  . Obesity (BMI 30-39.9) 08/24/2018  . Depression with anxiety 08/24/2018  . CAD (coronary artery disease) 08/24/2018  . Abnormal cardiovascular stress test 08/24/2018  . Lobar pneumonia, unspecified organism ([place]) 11/13/2017  . Precordial chest pain   . Sepsis ([place]) 09/18/2017  . Chronic diastolic CHF (congestive heart failure) ([place]) 09/18/2017  . Diabetes mellitus ([place]) 09/08/2017  . Left hip pain 10/28/2016  .  Chronic rhinitis 08/07/2016  . COPD with acute exacerbation ([place]) 04/25/2016  . Hypertension associated with diabetes ([place]) 01/30/2016  . Hypokalemia 01/27/2016  . HLD (hyperlipidemia)   . Type 2 diabetes mellitus without complication, without long-term current use of insulin ([place])   . OSA (obstructive sleep apnea) 05/06/2014  . Dyspnea on exertion 09/02/2012  . Chest pain 12/10/2011  . Chronic cough 05/11/2010  . GERD 06/28/2008    Conditions to be addressed/monitored:HTN, COPD and DMII  Care Plan : COPD (Adult)  Updates made by Luretha Rued, RN since 10/10/2020 12:00 AM  Problem: Symptom Exacerbation (COPD) due to Covid pneumonia   Priority: High  Long-Range Goal: Symptom Exacerbation Prevented or Minimized   Start Date: 08/30/2020  Expected End Date: 02/26/2021  This Visit's Progress: On track  Recent Progress: On track  Priority: High  Current Barriers:  [name] Knowledge deficits related to long term plan for self management of COPD-recent hospitalization 08/21/20-08/23/20 due to Covid pneumonia. Client reports "I am doing good". She denies any signs/symptoms of COPD exacerbation. But states she feels that as she ages from year to year, she notices an increase in her shortness of breath.  However, she is still able to perform ADL's, IADL's and take care of her animals. Last visit to pulmonologist noted to be 2019. She states that currently primary care provider has been treating her COPD. She states she will continue to follow up with primary care provider at this time, and discuss at her next office visit. But, states if she feels a need to see pulmonologist she will call for an appointment with pulmonologist.  Case Manager Clinical Goal(s):  patient will be able to verbalize understanding of COPD action plan and when to seek appropriate levels of medical care  Interventions:  . Collaboration with Copland, Gay Filler, MD regarding development and update of comprehensive plan of care as  evidenced by provider attestation and co-signature . Inter-disciplinary care team collaboration (see longitudinal plan of care) . Reviewed COPD Action Plan with client.  Advised patient to self assesses COPD action plan zone and make appointment with provider if in the yellow zone for 48 hours without improvement.  Update pharmacist, client's preference: would like to receive her medications mail order if possible. Client states she does not like auto delivery.  Send EMMI video re: COPD. Patient Goals/Self-Care Activities:  -keep all follow-up appointments. IF you do not have a follow up appointment scheduled please call to-- schedule.  -Call provider if in yellow zone 48 hours without improvement. -Discuss follow up plan with primary care for management of COPD.   -View Emmi video: "COPD: What Patients can do". Call if you have any questions. Follow Up Plan: The care management team will reach out to the patient again over the next 45 days.     Care Plan : Diabetes Type 2 (Adult)  Updates made by Luretha Rued, RN since 10/10/2020 12:00 AM  Problem: Disease Progression (Diabetes, Type 2)   Priority: Medium    Long-Range Goal: Disease Progression Prevented or Minimized   Start Date: 08/30/2020  Expected End Date: 02/26/2021  This Visit's Progress: On track  Recent Progress: On track  Priority: Medium  Objective:  Lab Results  Component Value Date   HGBA1C 9.3 (H) 08/22/2020 .   Lab Results  Component Value Date   CREATININE 0.82 08/23/2020   CREATININE 0.68 08/22/2020   CREATININE 0.69 08/21/2020   Current Barriers:  [name] Knowledge Deficits related to long term self care management of Diabetes. Client reports endocrinologist visit on 09/05/2020. A1C increase from 8.3 on 03/14/20 to 9.3 on 08/22/20. She states she has all her medications and denies any questions about her medications. Client states Trulicity was added 1.5mg  weekly and Basiglar increased to 30 units daily. Client is  also taking Farxiga 10 mg daily, Metformin 500 mg two tablets BID. She reports she feels better and things the Trulicity is helping. She reports she has been checking blood sugars daily. Client checked blood sugar during telephone visit BS=[id number] PC. Next visit with endocrinologist scheduled for 01/09/21. Noted client is also working with embedded Pharmacist to assist with medication assistance/management. She reports she has picked up Valsartan prescription. Client reports blood pressure 146/76 when last checked.  Case Manager Clinical Goal(s):  . patient will demonstrate improved adherence to prescribed treatment plan for diabetes self care/management as evidenced by: scheduling an appointment with endocrinologist . contacting provider for new or worsened symptoms or questions. Interventions:  . Collaboration with Copland, Gay Filler, MD regarding development and update of comprehensive plan of care as evidenced by provider attestation and co-signature . Inter-disciplinary care team collaboration (see longitudinal plan of care) .  Medications reviewed with client. . Reinforced Endocrinologist recommended checking blood sugars twice a day. . Discussed hypertension and diabetes management with client. . Discussed plans with patient for ongoing care management follow up and provided patient with direct contact information for care management team . Reviewed scheduled/upcoming provider appointments. Next visit with endocrinologist scheduled for 01/09/21  Patient Goals/Self-Care Activities - continue to check blood sugar at prescribed times and enter blood sugar readings and medication or insulin into daily log - continue to take medications as prescribed  - take the blood sugar log to all doctor visits - notify provider for sustained elevations or Blood sugars less than 70. - attend provider visits as scheduled and contact your provider with questions or concerns - continue to work with embedded clinic  pharmacist for medication questions or concerns.  - view Emmi video on Diabetes and Hypertension Follow Up Plan: The care management team will reach out to the patient again over the next 45 days.      Plan:The care management team will reach out to the patient again over the next 45 days.   Thea Silversmith, RN, MSN, BSN, CCM Care Management Coordinator [facility] [phone]

## 2020-10-13 ENCOUNTER — Ambulatory Visit (INDEPENDENT_AMBULATORY_CARE_PROVIDER_SITE_OTHER): Payer: Medicare Other | Admitting: Pharmacist

## 2020-10-13 DIAGNOSIS — I152 Hypertension secondary to endocrine disorders: Secondary | ICD-10-CM

## 2020-10-13 DIAGNOSIS — F418 Other specified anxiety disorders: Secondary | ICD-10-CM

## 2020-10-13 DIAGNOSIS — E1159 Type 2 diabetes mellitus with other circulatory complications: Secondary | ICD-10-CM

## 2020-10-13 DIAGNOSIS — E785 Hyperlipidemia, unspecified: Secondary | ICD-10-CM

## 2020-10-13 DIAGNOSIS — E119 Type 2 diabetes mellitus without complications: Secondary | ICD-10-CM

## 2020-10-13 DIAGNOSIS — I25118 Atherosclerotic heart disease of native coronary artery with other forms of angina pectoris: Secondary | ICD-10-CM

## 2020-10-13 NOTE — Chronic Care Management (AMB) (Signed)
Chronic Care Management Pharmacy Note  10/13/2020 Name:  Destiny Ballard MRN:  [id number] DOB:  05-23-1951  Subjective: MARCHELL FROMAN is an 70 y.o. year old female who is a primary patient of Copland, Gay Filler, MD.  The CCM team was consulted for assistance with disease management and care coordination needs.    Engaged with patient by telephone for follow up visit in response to provider referral for pharmacy case management and/or care coordination services. Main concern for today's call was to initiate patient assistance paperwork for Laverle Hobby, Nancee Liter and Trulicity  Consent to Services:  The patient was given information about Chronic Care Management services, agreed to services, and gave verbal consent prior to initiation of services.  Please see initial visit note for detailed documentation.   Patient Care Team: Copland, Gay Filler, MD as PCP - General (Family Medicine) Brien Few, MD as Consulting Physician (Obstetrics and Gynecology) Luretha Rued, RN as Case Manager [name], PharmD (Pharmacist)   Recent office visits: 09/04/2020 - PCP (Dr Lorelei Pont) Seen for f/u hospitalization for COVID 19. No medication changes. CBC and CMP checked. Flu vaccine administered.   Recent consult visits: 09/05/2020 - Endo (Dr Kelton Pillar) Seen for f/u type DM. A1c noted ton have increased over last 6 months form 8.2% to 9.3%. Goal is < 7.0%. Patient reported she had been out for Trulicity. She was instructed to restart Trulicity at increased dose of 1.22m weekly, increase Basaglar to 30 units daily. Instructed to continue metformin 5022m- take 2 tabs twice daily and continue Farxiga 1027mam.   Also instructed to check BF twice daily. F/U planned for 4 months.   Hospital visits: Medication Reconciliation was completed by comparing discharge summary, patient's EMR and Pharmacy list, and upon discussion with patient.  Admitted to the hospital on 08/21/2020 due to  COVID 19 pneumonia. Discharge date was 08/23/2020. [facility] -started Remdesivir infusion X 2 and dexamethasone 6mg51mtake 1 tablet daily for 5 days.  Medication Changes at Hospital Discharge: -Changed none  Medications Discontinued at Hospital Discharge: -Stopped methocarbamol, triamcinolone 0.1%1.9%am and Trulicity 0.754.17EYweekly   Medications that remain the same after Hospital Discharge:??  -All other medications will remain the same.    Objective:  Lab Results  Component Value Date   CREATININE 0.62 09/04/2020   CREATININE 0.82 08/23/2020   CREATININE 0.68 08/22/2020    Lab Results  Component Value Date   HGBA1C 9.3 (H) 08/22/2020   Last diabetic Eye exam: No results found for: HMDIABEYEEXA  Last diabetic Foot exam: No results found for: HMDIABFOOTEX      Component Value Date/Time   CHOL 145 09/09/2019 1128   CHOL 200 (H) 05/19/2018 0939   TRIG 366 (H) 08/21/2020 1653   HDL 42.60 09/09/2019 1128   HDL 37 (L) 05/19/2018 0939   CHOLHDL 3 09/09/2019 1128   VLDL 72.6 (H) 09/09/2019 1128   LDLCALC Comment 05/19/2018 0939   LDLDIRECT 64.0 09/09/2019 1128    Hepatic Function Latest Ref Rng & Units 09/04/2020 08/23/2020 08/22/2020  Total Protein 6.0 - 8.3 g/dL 6.6 7.0 7.1  Albumin 3.5 - 5.2 g/dL 3.7 3.2(L) 3.2(L)  AST 0 - 37 U/L _0 ALT 0 - 35 U/L 28 28 37  Alk Phosphatase 39 - 117 U/L 101 79 92  Total Bilirubin 0.2 - 1.2 mg/dL 0.6 0.4 0.7  Bilirubin, Direct 0.0 - 0.2 mg/dL - - -  Lab Results  Component Value Date/Time   TSH 2.361 01/27/2016 02:52 AM   TSH 0.83 03/24/2014 11:14 AM   TSH 0.88 09/10/2013 02:41 PM    CBC Latest Ref Rng & Units 09/04/2020 08/23/2020 08/22/2020  WBC 4.0 - 10.5 K/uL 9.0 9.7 8.1  Hemoglobin 12.0 - 15.0 g/dL 12.1 12.4 12.6  Hematocrit 36.0 - 46.0 % 37.6 39.9 40.0  Platelets 150.0 - 400.0 K/uL 236.0 230 204    Lab Results  Component Value  Date/Time   VD25OH 26 (L) 09/16/2011 10:58 AM    Clinical ASCVD: Yes  The 10-year ASCVD risk score Mikey Bussing DC Jr., et al., 2013) is: 35.4%   Values used to calculate the score:     Age: 66 years     Sex: Female     Is Non-Hispanic African American: No     Diabetic: Yes     Tobacco smoker: No     Systolic Blood Pressure: 706 mmHg     Is BP treated: Yes     HDL Cholesterol: 42.6 mg/dL     Total Cholesterol: 145 mg/dL    Other: (CHADS2VASc if Afib, PHQ9 if depression, MMRC or CAT for COPD, ACT, DEXA)  Social History   Tobacco Use  Smoking Status Passive Smoke Exposure - Never Smoker  Smokeless Tobacco Never Used   BP Readings from Last 3 Encounters:  09/05/20 (!) 160/70  09/04/20 (!) 166/68  08/25/20 (!) 147/74   Pulse Readings from Last 3 Encounters:  09/05/20 92  09/04/20 99  08/25/20 71   Wt Readings from Last 3 Encounters:  09/05/20 178 lb 6 oz (80.9 kg)  09/04/20 178 lb (80.7 kg)  08/22/20 176 lb 2.4 oz (79.9 kg)    Assessment: Review of patient past medical history, allergies, medications, health status, including review of consultants reports, laboratory and other test data, was performed as part of comprehensive evaluation and provision of chronic care management services.   SDOH:  (Social Determinants of Health) assessments and interventions performed:    CCM Care Plan  Allergies  Allergen Reactions  . Prednisone Other (See Comments)    REACTION: mood swings, nightmares. "Shot doesn't bother me, reaction is just with the pill" she states she has had the steroid injections before. From our records methylprednisone was given to her in 2013 without any complications.    Medications Reviewed Today    Reviewed by [name], PharmD (Pharmacist) on 10/13/20 at 1340  Med List Status: <None>  Medication Order Taking? Sig Documenting Provider Last Dose Status Informant  albuterol (VENTOLIN HFA) 108 (90 Base) MCG/ACT inhaler [id number] Yes Inhale 2 puffs into the  lungs every 8 (eight) hours as needed for wheezing or shortness of breath. Jonetta Osgood, MD Taking Active   aspirin EC 81 MG tablet [id number] Yes Take 81 mg by mouth daily. [provider] Taking Active Self  benzonatate (TESSALON) 200 MG capsule [id number] Yes Take 1 capsule (200 mg total) by mouth 3 (three) times daily as needed for cough. Ghimire, Henreitta Leber, MD Taking Active   calcium carbonate (TUMS EX) 750 MG chewable tablet [id number] Yes Chew 1 tablet by mouth daily as needed for heartburn. [provider] Taking Active   Cholecalciferol (VITAMIN D3) 1000 UNITS CAPS [id number] Yes Take 1,000 Units by mouth daily. [provider] Taking Active Self  dapagliflozin propanediol (FARXIGA) 10 MG TABS tablet [id number] Yes Take 1 tablet (10 mg total) by mouth daily. Shamleffer, Melanie Crazier, MD Taking Active Self  Dulaglutide ([place])  1.5 MG/0.5ML SOPN [id number] Yes Inject 1.5 mg into the skin once a week. Shamleffer, Melanie Crazier, MD Taking Active   Fexofenadine HCl [facility] ALLERGY PO) [id number] Yes Take 1 tablet by mouth daily. [provider] Taking Active Self  FLUoxetine (PROZAC) 40 MG capsule [id number] Yes TAKE 1 CAPSULE BY MOUTH EVERY DAY  Patient taking differently: Take 40 mg by mouth daily.   Copland, Gay Filler, MD Taking Active   fluticasone (FLONASE) 50 MCG/ACT nasal spray [id number] Yes SPRAY 2 SPRAYS INTO EACH NOSTRIL EVERY DAY  Patient taking differently: Place 2 sprays into both nostrils daily as needed for allergies.   Collene Gobble, MD Taking Active   furosemide (LASIX) 20 MG tablet [id number] Yes TAKE 1 TABLET BY MOUTH EVERY DAY  Patient taking differently: Take 20 mg by mouth daily.   Burtis Junes, NP Taking Active   gabapentin (NEURONTIN) 300 MG capsule [id number] Yes Take 2 capsules (600 mg total) by mouth 2 (two) times daily. Copland, Gay Filler, MD Taking Active   hydrALAZINE (APRESOLINE) 25 MG tablet [id number] Yes Patient  needs to schedule appointment for any future refills.  Please call [phone].  1st attempt.  Patient taking differently: Take 25 mg by mouth 2 (two) times daily.   Burtis Junes, NP Taking Active Self  hydrOXYzine (ATARAX/VISTARIL) 25 MG tablet [id number] Yes TAKE 1/2 TO 1 TABLET BY MOUTH EVERY 8 HOURS AS NEEDED FOR ITCHING  Patient taking differently: Take 12.5-25 mg by mouth every 8 (eight) hours as needed for itching.   Copland, Gay Filler, MD Taking Active   Insulin Glargine [facility] KWIKPEN) 100 UNIT/ML [id number] Yes Inject 30 Units into the skin daily. Shamleffer, Melanie Crazier, MD Taking Active   Insulin Pen Needle 32G X 4 MM MISC [id number] Yes 1 Device by Does not apply route daily. Shamleffer, Melanie Crazier, MD Taking Active   magnesium oxide (MAG-OX) 400 MG tablet [id number] Yes Take 400 mg by mouth daily. [provider] Taking Active Self  metFORMIN (GLUCOPHAGE-XR) 500 MG 24 hr tablet [id number] Yes Take 2 tablets (1,000 mg total) by mouth 2 (two) times daily. Shamleffer, Melanie Crazier, MD Taking Active Self  metoprolol succinate (TOPROL-XL) 100 MG 24 hr tablet [id number] Yes TAKE 1 TABLET BY MOUTH EVERY DAY  Patient taking differently: Take 100 mg by mouth daily.   Copland, Gay Filler, MD Taking Active   montelukast (SINGULAIR) 10 MG tablet [id number] Yes TAKE 1 TABLET BY MOUTH EVERY DAY AS NEEDED FOR ALLERGIES  Patient taking differently: Take 10 mg by mouth at bedtime.   Copland, Gay Filler, MD Taking Active   nitroGLYCERIN (NITROSTAT) 0.4 MG SL tablet [id number] Yes Place 1 tablet (0.4 mg total) under the tongue every 5 (five) minutes as needed for chest pain. Copland, Gay Filler, MD Taking Active Self           Med Note Luciano Cutter   Tue Aug 22, 2020 10:43 AM)    Potassium 99 MG TABS [id number] Yes Take 1 tablet by mouth daily. [provider] Taking Active Self  rosuvastatin (CRESTOR) 20 MG tablet [id number] Yes TAKE 1 TABLET BY MOUTH EVERY DAY  Patient  taking differently: Take 20 mg by mouth every evening.   Copland, Gay Filler, MD Taking Active   Tiotropium Bromide Monohydrate (SPIRIVA RESPIMAT) 2.5 MCG/ACT AERS [id number] Yes Inhale 2 puffs into the lungs daily.  Patient taking differently: Inhale 2 puffs into the lungs daily as needed (shortness of breath).   [address], [place],  MD Taking Active   traZODone (DESYREL) 50 MG tablet [id number] Yes TAKE 1/2 TO 1 TABLET BY MOUTH AT BEDTIME AS NEEDED FOR SLEEP  Patient taking differently: Take 25-50 mg by mouth at bedtime as needed for sleep.   Copland, Gay Filler, MD Taking Active Self  valsartan (DIOVAN) 160 MG tablet [id number] Yes Take 1 tablet (160 mg total) by mouth daily. Copland, Gay Filler, MD Taking Active   zinc gluconate 50 MG tablet [id number] Yes Take 50 mg by mouth daily. [provider] Taking Active Self          Patient Active Problem List   Diagnosis Date Noted  . Pneumonia due to COVID-19 virus 08/21/2020  . Hyperlipidemia associated with type 2 diabetes mellitus ([place]) 08/21/2020  . Uncontrolled type 2 diabetes mellitus with complication, with long-term current use of insulin ([place]) 03/14/2020  . Type 2 diabetes mellitus with hyperglycemia, with long-term current use of insulin ([place]) 12/10/2019  . Type 2 diabetes mellitus with diabetic polyneuropathy, with long-term current use of insulin ([place]) 12/10/2019  . Obesity (BMI 30-39.9) 08/24/2018  . Depression with anxiety 08/24/2018  . CAD (coronary artery disease) 08/24/2018  . Abnormal cardiovascular stress test 08/24/2018  . Lobar pneumonia, unspecified organism ([place]) 11/13/2017  . Precordial chest pain   . Sepsis ([place]) 09/18/2017  . Chronic diastolic CHF (congestive heart failure) ([place]) 09/18/2017  . Diabetes mellitus ([place]) 09/08/2017  . Left hip pain 10/28/2016  . Chronic rhinitis 08/07/2016  . COPD with acute exacerbation ([place]) 04/25/2016  . Hypertension associated with diabetes ([place]) 01/30/2016  . Hypokalemia  01/27/2016  . HLD (hyperlipidemia)   . Type 2 diabetes mellitus without complication, without long-term current use of insulin ([place])   . OSA (obstructive sleep apnea) 05/06/2014  . Dyspnea on exertion 09/02/2012  . Chest pain 12/10/2011  . Chronic cough 05/11/2010  . GERD 06/28/2008    Immunization History  Administered Date(s) Administered  . Fluad Quad(high Dose 65+) 09/04/2020  . Influenza Whole 04/14/2010  . Influenza, High Dose Seasonal PF 04/03/2017, 05/11/2018, 03/05/2019  . Influenza,inj,Quad PF,6+ Mos 05/10/2013, 04/05/2014, 05/22/2015, 04/25/2016  . Pneumococcal Conjugate-13 07/24/2016  . Pneumococcal Polysaccharide-23 04/05/2014, 05/22/2015  . Tdap 07/11/2014, 03/05/2019  . Zoster Recombinat (Shingrix) 03/05/2019    Conditions to be addressed/monitored: HTN, HLD, DMII, Anxiety, Depression and GERD  General Pharmacy Care Plan and Goals:   Current Barriers:  . Unable to independently afford treatment regimen . Unable to achieve control of type 2 DM, HTN   Pharmacist Clinical Goal(s):  [name] Over the next 90 days, patient will achieve adherence to monitoring guidelines and medication adherence to achieve therapeutic efficacy . achieve control of diabetes and HTN as evidenced by meeting goal as described in patient's care plan, through collaboration with PharmD and provider.   Interventions: . 1:1 collaboration with Copland, Gay Filler, MD regarding development and update of comprehensive plan of care as evidenced by provider attestation and co-signature . Inter-disciplinary care team collaboration (see longitudinal plan of care) . Comprehensive medication review performed; medication list updated in electronic medical record  Diabetes: . Uncontrolled;  current treatment:Farxiga 65m daily; Basaglar 30 units daily; Trulicity 16.7MCweekly and metformin ER 507m- take 2 tablets twice a day . Current glucose readings: 157-225 . Denies hypoglycemic/hyperglycemic  symptoms . Educated on importance of taking all medication for diabetes as prescribed.  Collaborated with PCP and endocrinologist to improve medication access. Researching possible discount cards and PAP for brand medications.   Hypertension: . Uncontrolled; current treatment:  hydralazine 73m twice a day; furosemide 2108mdaily; losartan 10028maily and metoprolol succinate 100m35mily. . Current home readings: 160/70 and 166/68 . Educated on BP goals and improtance of obtaining BP goals to decrease risk of stroke and renal disease. Collaborated with provider and pharmacy to ensure patient has needed medications. Losartan was changed to valsartan 160mg74mly by provider   COPD: .     Controlled; current treatment: Spiriva 2.5mg -71minhalation once a day; Albuterol inhaler as needed for shortness of breath and wheezing; montelukast 10mg  70mily at bedtime; benzonatate 200mg up86m3 times a day as needed for cough.  . one exacerbation requiring treatment in the last 6 months (related to COVID) .[place] continue current regimen. Will coordinate with PCP to identify cost saving options for inhalers.   Depression with Anxiety:  . Controlled with current therapy of trazodone 50mg - t83m0.5 tablet = 25mg qhs;23moxetine 40mg daily7mecommend continue current regimen for depression and anxiety.  Hyperlipidemia / CAD:  . Controlled with current therapy of rosuvastatin 20mg daily 70mASA 81mg daily .45mcussed limiting intake of saturate and trans fat; continue current regimen for lipids / reduction of heart disease  Medication Assistance: Application for Farxiga, SpirVickey Hugerd Trulicity  medication assistance program. in process.  Anticipated assistance start date 11/12/2020.  See plan of care for additional detail.  Patient's preferred pharmacy is:  [id number] [id number] [phone]  [phone] agrees to Care Plan and Follow-up.  Plan: Telephone follow up appointment with care management team member scheduled for:  2 weeks  Koralynn Greenspan, Cherre Robinscal Pharmacist [facility]

## 2020-10-13 NOTE — Patient Instructions (Signed)
Visit Information  PATIENT GOALS: Goals Addressed    . Chronic Care Management Pharmacy Care Plan   Not on track    Current Barriers:  . Patient states Stiolto copay is high especially with other medications she takes that have brand / high copays . Unable to achieve control of hypertension (high blood pressure) and diabetes  . Pharmacy unable to provide losartan due to on backorder   Chronic Obstructive Pulmonary Disease: Pharmacist Clinical Goal(s):  Over the next 90 days, patient will continue to adhere to current inhaler regimen and identify potential cost savings through collaboration with PharmD and provider.  Current treatment:  . Spiriva 2.5mg  - 1 inhalation once a day;  . Albuterol inhaler as needed for shortness of breath and wheezing;  . montelukast 10mg  daily at bedtime;  . benzonatate 200mg  up to 3 times a day as needed for cough.  Interventions: Recommended continue current regimen. Checking into savings programs. Since patient has comercial [employer] plan with mail carriers she should be able to use coupon / discount cards to help lower copays Patient self care activities - Over the next 90 days patient will: . Take medications as prescribed . Collaborate with provider on medication access solutions   Hypertension / High Blood Pressure: Pharmacist Clinical Goal(s):  Over the next 90 days, patient will achieve adherence to monitoring guidelines and medication adherence to achieve therapeutic efficacy. Also will achieve control of blood pressure as evidenced by home BP readings < 140/90 through collaboration with PharmD and provider.  current treatment:  . losartan 100mg  daily . metoprolol succinate 100mg  daily . hydralazine 25mg  twice a day  Interventions: . Educated on BP goals and improtance of obtaining BP goals to decrease risk of stroke and renal disease . Collaborated with provider and pharmacy to ensure patient has needed medications. Losartan was changed to  valsartan 160mg  daily by provider.  Patient Self-Care Activities - Over the next 180 days patient will:  . Focus on medication adherence by taking all medications as prescribed . Check blood pressure 2 to 3 times per week, document, and provide at future appointments . Collaborate with clinical pharmacist and provider on medication access solutions.   Diabetes: Pharmacist Clinical Goal(s):  [name] Over the next 90 days, patient will achieve adherence to monitoring guidelines and medication adherence to achieve therapeutic efficacy . Achieve control of diabetes as evidenced by meeting goal of A1c <7.0% through collaboration with PharmD and provider.  Current treatment: . Farxiga 10mg  daily . Basaglar 30 units daily . Trulicity 1.5mg  weekly . metformin ER 500mg  - take 2 tablets twice a day Interventions: . Educated on importance of taking all medication for diabetes as prescribed.  Nash Dimmer with PCP and endocrinologist to improve medication access. Researching possible discount cards and PAP for brand medications.  Patient Self-Care Activities - Over the next 180 days patient will:  . Focus on medication adherence by taking all medications as prescribed . Check blood glucose 2 times per day; document, and provide at future appointments . Collaborate with clinical pharmacist and provider on medication access solutions.    Depression with Anxiety:  Pharmacist Clinical Goal(s):  Over the next 90 days, patient will continue to adhere to medication regimen for depression and anxiety while minimizing potential side effects Current treatment:  . trazodone 50mg  - take 0.5 tablet = 25mg  qhs . fluoxetine 40mg  daily Interventions:  . Recommend continue current regimen for depression and anxiety. Patient self care activities - Over the next 90 days patient  will: . Take medications as prescribed  Hyperlipidemia / CAD:  Pharmacist Clinical Goal(s):  Over the next 90 days, patient will maintain  LDL of <70 Current treatment:  . Rosuvastatin 20mg  daily  . Aspirin 81mg  daily Interventions:  . Recommend continue current regimen or heart and cholesterol . Discussed following diet low in sugar and saturated fat.  Patient self care activities - Over the next 90 days patient will: . Continue current medications as prescribed for heart / cholesterol       The patient verbalized understanding of instructions, educational materials, and care plan provided today and agreed to receive a mailed copy of patient instructions, educational materials, and care plan.   Telephone follow up appointment with care management team member scheduled for: 1-2 months  [name], PharmD Clinical Pharmacist [facility] [phone]

## 2020-10-13 NOTE — Patient Instructions (Signed)
Visit Information  PATIENT GOALS: Goals Addressed            This Visit's Progress   . Chronic Care Management Pharmacy Care Plan       Current Barriers:  . Patient states Stiolto copay is high especially with other medications she takes that have brand / high copays . Unable to achieve control of hypertension (high blood pressure) and diabetes    Chronic Obstructive Pulmonary Disease: Pharmacist Clinical Goal(s):  Over the next 90 days, patient will continue to adhere to current inhaler regimen and identify potential cost savings through collaboration with PharmD and provider.  Current treatment:  . Spiriva 2.5mg  - 1 inhalation once a day;  . Albuterol inhaler as needed for shortness of breath and wheezing;  . montelukast 10mg  daily at bedtime;  . benzonatate 200mg  up to 3 times a day as needed for cough.  Interventions: Recommended continue current regimen.   Started application process for Spiriva Inhaler thru [place] program Patient self care activities - Over the next 90 days patient will: . Take medications as prescribed . Collaborate with provider on medication access solutions . Bring income documents to PCP office and sign application for patient assistance.    Hypertension / High Blood Pressure: Pharmacist Clinical Goal(s):  Over the next 90 days, patient will achieve adherence to monitoring guidelines and medication adherence to achieve therapeutic efficacy. Also will achieve control of blood pressure as evidenced by home BP readings < 140/90 through collaboration with PharmD and provider.  current treatment:  . Valsartan 160mg  daily ( to replace losartan 100mg  daily which is on backorder) . metoprolol succinate 100mg  daily . hydralazine 25mg  twice a day  Interventions: . Educated on BP goals and improtance of obtaining BP goals to decrease risk of stroke and renal disease . Patient endorsed she had picked up valsartan 160mg  from pharmacy  Patient Self-Care  Activities - Over the next 180 days patient will:  . Focus on medication adherence by taking all medications as prescribed . Check blood pressure 2 to 3 times per week, document, and provide at future appointments . Collaborate with clinical pharmacist and provider on medication access solutions.   Diabetes: Pharmacist Clinical Goal(s):  [name] Over the next 90 days, patient will achieve adherence to monitoring guidelines and medication adherence to achieve therapeutic efficacy . Achieve control of diabetes as evidenced by meeting goal of A1c <7.0% through collaboration with PharmD and provider.  Current treatment: . Farxiga 10mg  daily . Basaglar 30 units daily . Trulicity 1.5mg  weekly . metformin ER 500mg  - take 2 tablets twice a day Interventions: . Educated on importance of taking all medication for diabetes as prescribed.  Nash Dimmer with PCP and endocrinologist to improve medication access. Started paperwork for Cruzita Lederer and Trulicity patient assistance programs.   Patient Self-Care Activities - Over the next 180 days patient will:  . Focus on medication adherence by taking all medications as prescribed . Check blood glucose 2 times per day; document, and provide at future appointments . Collaborate with clinical pharmacist and provider on medication access solutions.  . Bring income documents to PCP office and sign application for patient assistance.   Depression with Anxiety:  Pharmacist Clinical Goal(s):  Over the next 90 days, patient will continue to adhere to medication regimen for depression and anxiety while minimizing potential side effects Current treatment:  . trazodone 50mg  - take 0.5 tablet = 25mg  qhs . fluoxetine 40mg  daily Interventions:  . Recommend continue current regimen  for depression and anxiety. Patient self care activities - Over the next 90 days patient will: . Take medications as prescribed  Hyperlipidemia / CAD:  Pharmacist Clinical Goal(s):   Over the next 90 days, patient will maintain LDL of <70 Current treatment:  . Rosuvastatin 20mg  daily  . Aspirin 81mg  daily Interventions:  . Recommend continue current regimen or heart and cholesterol . Discussed following diet low in sugar and saturated fat.  Patient self care activities - Over the next 90 days patient will: . Continue current medications as prescribed for heart / cholesterol         The patient verbalized understanding of instructions, educational materials, and care plan provided today and agreed to receive a mailed copy of patient instructions, educational materials, and care plan.   Telephone follow up appointment with care management team member scheduled for: 2 weeks  [name], PharmD Clinical Pharmacist [facility] [phone]

## 2020-10-15 IMAGING — CT CT ABD-PELV W/ CM
2 of 5 series · 16 of 46 positions shown, 18 images · IV contrast (Omnipaque)
Comparison: August 23, 2018

CLINICAL DATA: Abdominal abscess/infection suspected.

EXAM:
CT ABDOMEN AND PELVIS WITH CONTRAST
TECHNIQUE: Multidetector CT imaging of the abdomen and pelvis was performed
using the standard protocol following bolus administration of
intravenous contrast.
CONTRAST:  100mL OMNIPAQUE IOHEXOL 300 MG/ML  SOLN

[Series 2: axial st · axial · 0.94mm/px · z∈[-419,-14]mm · 13 of 93 slices shown, 15 images]
[im 6/93  soft-tissue]
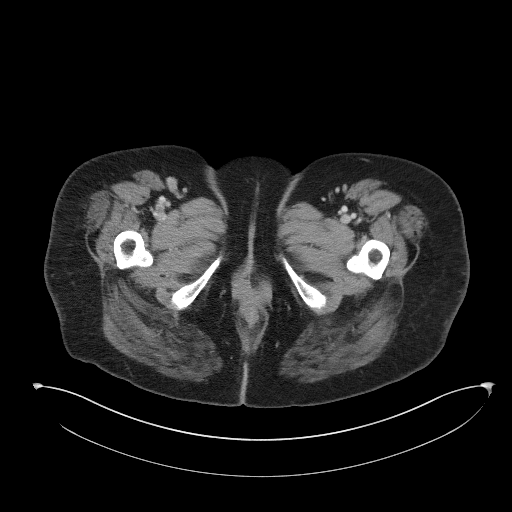
[im 6/93  bone]
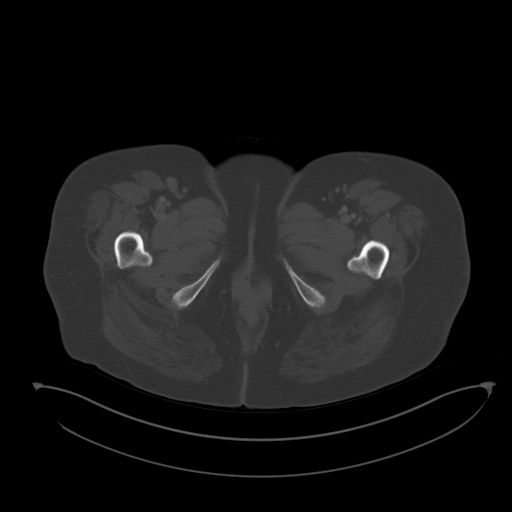
[im 11/93  soft-tissue]
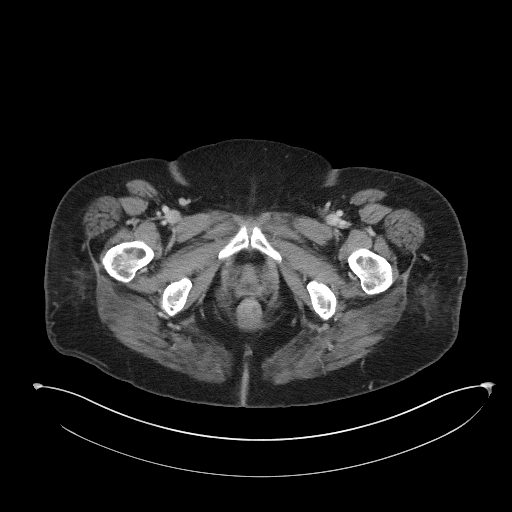
[im 21/93  soft-tissue]
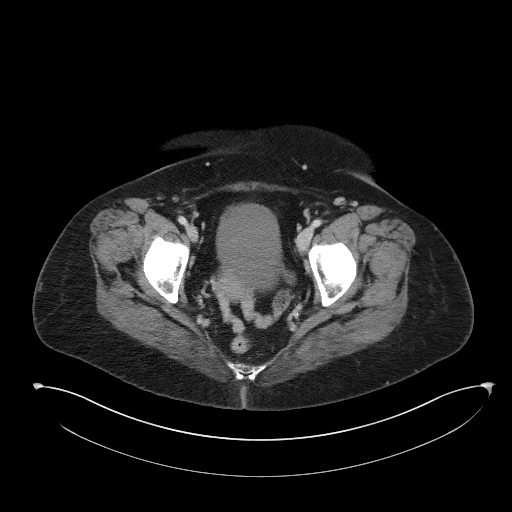
[im 26/93  soft-tissue]
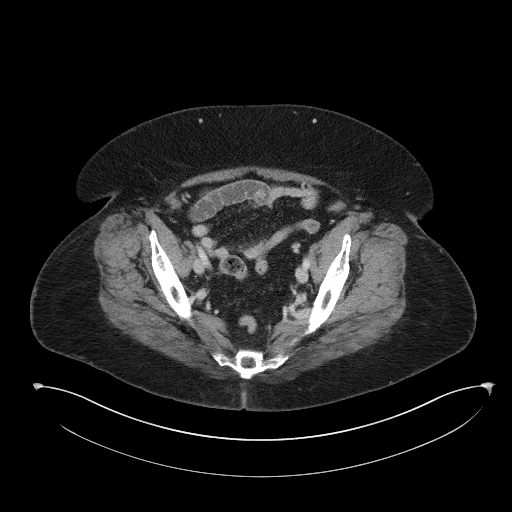
[im 31/93  soft-tissue]
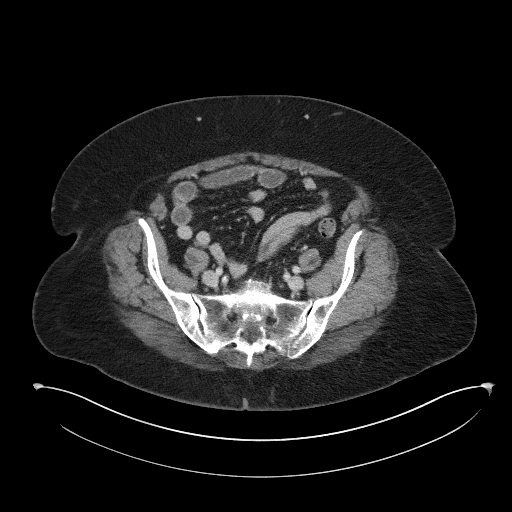
[im 41/93  soft-tissue]
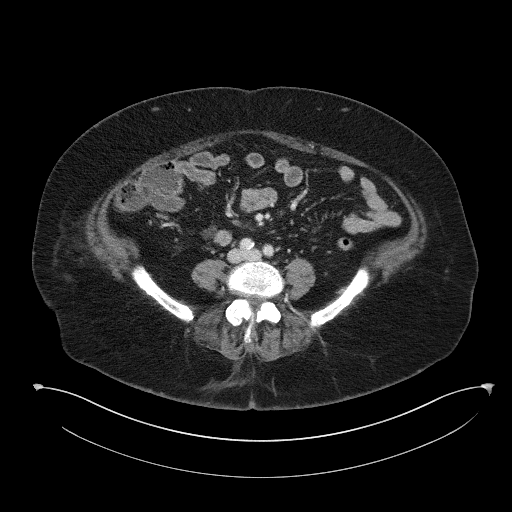
[im 47/93  soft-tissue]
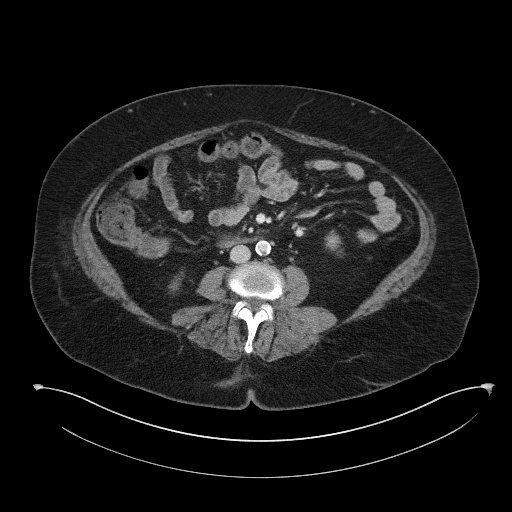
[im 52/93  soft-tissue]
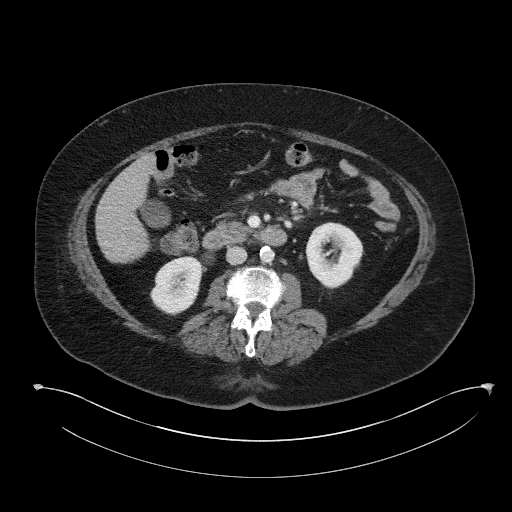
[im 62/93  soft-tissue]
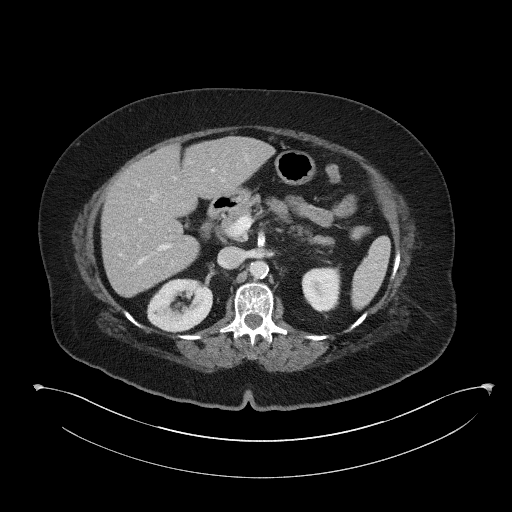
[im 62/93  bone]
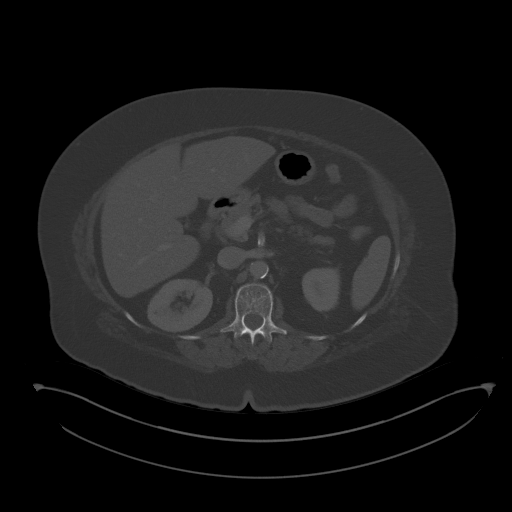
[im 67/93  soft-tissue]
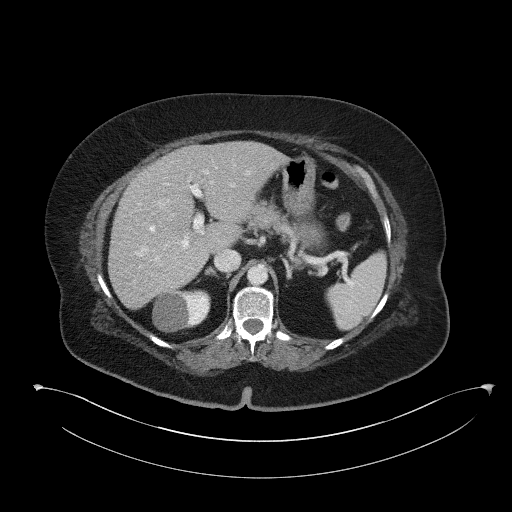
[im 72/93  soft-tissue]
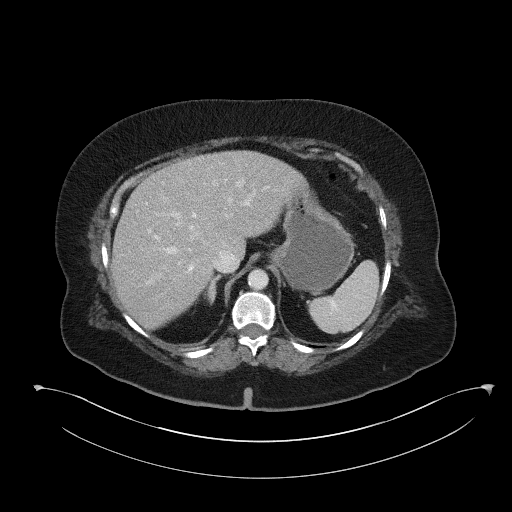
[im 82/93  soft-tissue]
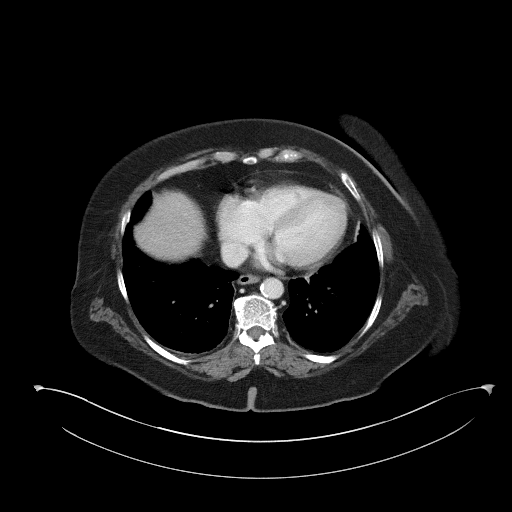
[im 87/93  soft-tissue]
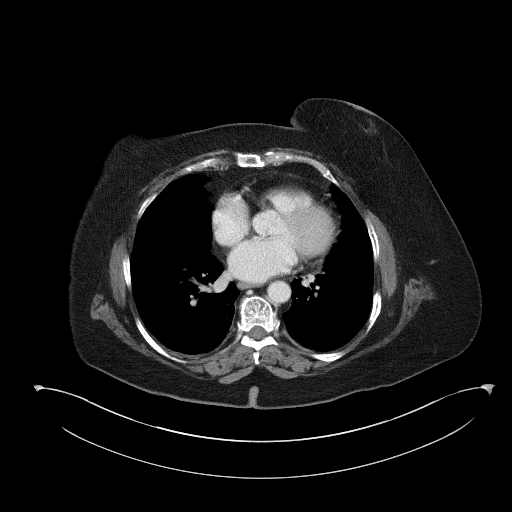

[Series 5: coronal st · coronal · 0.78mm/px · 3 of 96 slices shown]
[im 32/96  soft-tissue]
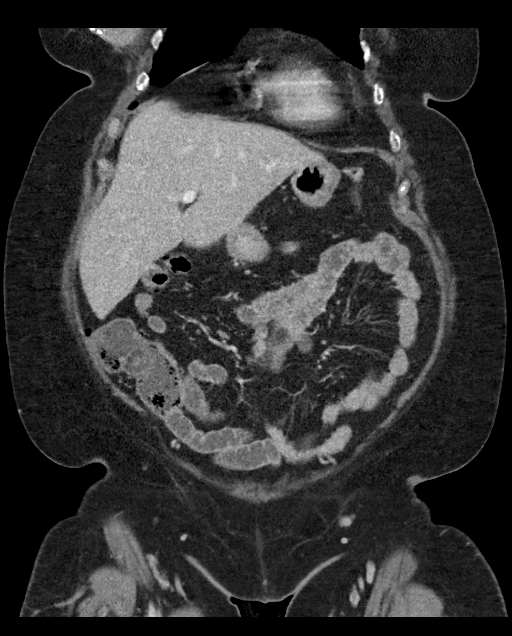
[im 43/96  soft-tissue]
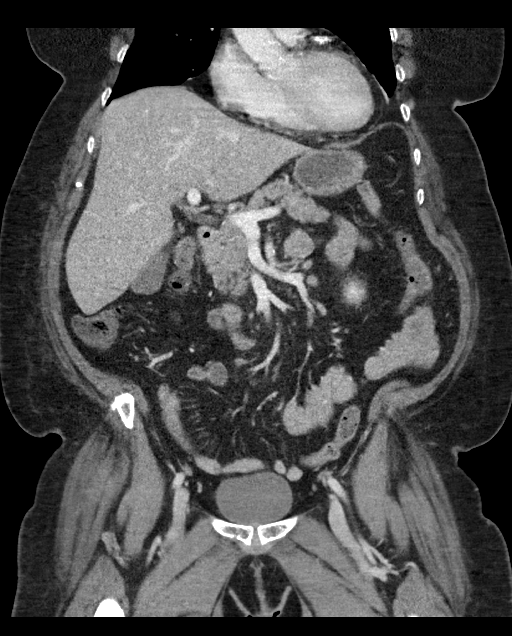
[im 53/96  soft-tissue]
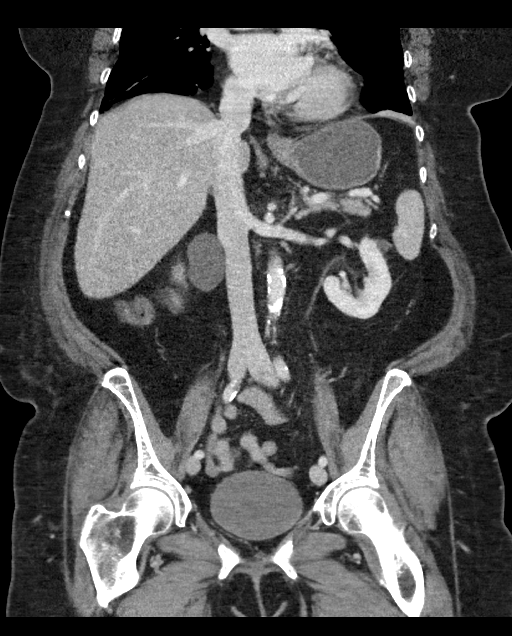

[16 of 46 positions shown; findings below may reference images not displayed]

FINDINGS: Lower chest: No acute abnormality. Stable small posterior fat
containing right diaphragmatic hernia.

Hepatobiliary: No focal liver abnormality is seen. No gallstones,
gallbladder wall thickening, or biliary dilatation.

Pancreas: Unremarkable. No pancreatic ductal dilatation or
surrounding inflammatory changes.

Spleen: Normal in size without focal abnormality.

Adrenals/Urinary Tract: Normal adrenal glands. 3.5 x 3.8 by 3.6 cm
partially exophytic right upper pole renal mass, slightly enlarged.
Exophytic circumscribed low-density mass off of the lower medial
pole of the right kidney measures 4.5 cm. The previously noted
calculus in the lower pole of the left kidney is no longer present.
Tiny nonobstructive 1-2 mm calculus is present in the lower pole of
the right kidney. There are bilateral too small to be actually
characterized hypoattenuated nodules in bilateral kidneys, possibly
representing cysts.

Stomach/Bowel: Stomach is within normal limits. No evidence of
appendicitis. No evidence of bowel wall thickening, distention, or
inflammatory changes.

Vascular/Lymphatic: Aortic atherosclerosis. No enlarged abdominal or
pelvic lymph nodes.

Reproductive: Uterus and bilateral adnexa are unremarkable.

Other: No abdominal wall hernia or abnormality. No abdominopelvic
ascites.

Musculoskeletal: No acute or significant osseous findings.
IMPRESSION: 1. No evidence of abdominal abscess.
2. 3.5 cm partially exophytic right upper pole renal mass, slightly
enlarged from the prior exam. According to abdominal MRI from February 28, 2018, this represents a complicated cyst.
3. 4.5 cm exophytic low-density mass off of the lower medial pole of
the right kidney, stable and also representing a complicated cyst
according to the above-stated MRI.
4. Tiny nonobstructive 1-2 mm calculus in the lower pole of the
right kidney.

Aortic Atherosclerosis (CX6OS-XDQ.Q).

## 2020-10-26 ENCOUNTER — Telehealth: Payer: Medicare Other

## 2020-10-27 ENCOUNTER — Ambulatory Visit: Payer: Medicare Other | Admitting: Pharmacist

## 2020-10-27 DIAGNOSIS — E1159 Type 2 diabetes mellitus with other circulatory complications: Secondary | ICD-10-CM

## 2020-10-27 DIAGNOSIS — F418 Other specified anxiety disorders: Secondary | ICD-10-CM

## 2020-10-27 DIAGNOSIS — I152 Hypertension secondary to endocrine disorders: Secondary | ICD-10-CM

## 2020-10-27 DIAGNOSIS — Z794 Long term (current) use of insulin: Secondary | ICD-10-CM | POA: Diagnosis not present

## 2020-10-27 DIAGNOSIS — E1165 Type 2 diabetes mellitus with hyperglycemia: Secondary | ICD-10-CM

## 2020-10-27 DIAGNOSIS — E118 Type 2 diabetes mellitus with unspecified complications: Secondary | ICD-10-CM

## 2020-10-27 DIAGNOSIS — J449 Chronic obstructive pulmonary disease, unspecified: Secondary | ICD-10-CM

## 2020-10-27 DIAGNOSIS — E119 Type 2 diabetes mellitus without complications: Secondary | ICD-10-CM

## 2020-10-27 DIAGNOSIS — I25118 Atherosclerotic heart disease of native coronary artery with other forms of angina pectoris: Secondary | ICD-10-CM

## 2020-10-27 DIAGNOSIS — E785 Hyperlipidemia, unspecified: Secondary | ICD-10-CM

## 2020-10-27 DIAGNOSIS — IMO0002 Reserved for concepts with insufficient information to code with codable children: Secondary | ICD-10-CM

## 2020-10-27 NOTE — Chronic Care Management (AMB) (Signed)
  Chronic Care Management Pharmacy Note  10/27/2020 Name:  Destiny Ballard MRN:  [id number] DOB:  08/20/1950  Subjective: Destiny Ballard is an 69 y.o. year old female who is a primary patient of Copland, Jessica C, MD.  The CCM team was consulted for assistance with disease management and care coordination needs.    Engaged with patient by telephone for follow up visit in response to provider referral for pharmacy case management and/or care coordination services. Main concern for today's call was to initiate patient assistance paperwork for Fraxiga, Spiriva, Basaglar and Trulicity  Consent to Services:  The patient was given information about Chronic Care Management services, agreed to services, and gave verbal consent prior to initiation of services.  Please see initial visit note for detailed documentation.   Patient Care Team: Copland, Jessica C, MD as PCP - General (Family Medicine) Taavon, Richard, MD as Consulting Physician (Obstetrics and Gynecology) Wallace, Juana M, RN as Case Manager Eckard, Tammy, PharmD (Pharmacist)   Recent office visits: 09/04/2020 - PCP (Dr Copland) Seen for f/u hospitalization for COVID 19. No medication changes. CBC and CMP checked. Flu vaccine administered.   Recent consult visits: 09/05/2020 - Endo (Dr Shamleffer) Seen for f/u type DM. A1c noted ton have increased over last 6 months form 8.2% to 9.3%. Goal is < 7.0%. Patient reported she had been out for Trulicity. She was instructed to restart Trulicity at increased dose of 1.5mg weekly, increase Basaglar to 30 units daily. Instructed to continue metformin 500mg - take 2 tabs twice daily and continue Farxiga 10mg qam.   Also instructed to check BF twice daily. F/U planned for 4 months.   Hospital visits: Medication Reconciliation was completed by comparing discharge summary, patient's EMR and Pharmacy list, and upon discussion with patient.  Admitted to the hospital on 08/21/2020 due to  COVID 19 pneumonia. Discharge date was 08/23/2020. Discharged from [facility].    New?Medications Started at Hospital Discharge:?? -started Remdesivir infusion X 2 and dexamethasone 6mg - take 1 tablet daily for 5 days.  Medication Changes at Hospital Discharge: -Changed none  Medications Discontinued at Hospital Discharge: -Stopped methocarbamol, triamcinolone 0.1% cream and Trulicity 0.75mg SQ weekly   Medications that remain the same after Hospital Discharge:??  -All other medications will remain the same.    Objective:  Lab Results  Component Value Date   CREATININE 0.62 09/04/2020   CREATININE 0.82 08/23/2020   CREATININE 0.68 08/22/2020    Lab Results  Component Value Date   HGBA1C 9.3 (H) 08/22/2020   Last diabetic Eye exam: No results found for: HMDIABEYEEXA  Last diabetic Foot exam: No results found for: HMDIABFOOTEX      Component Value Date/Time   CHOL 145 09/09/2019 1128   CHOL 200 (H) 05/19/2018 0939   TRIG 366 (H) 08/21/2020 1653   HDL 42.60 09/09/2019 1128   HDL 37 (L) 05/19/2018 0939   CHOLHDL 3 09/09/2019 1128   VLDL 72.6 (H) 09/09/2019 1128   LDLCALC Comment 05/19/2018 0939   LDLDIRECT 64.0 09/09/2019 1128    Hepatic Function Latest Ref Rng & Units 09/04/2020 08/23/2020 08/22/2020  Total Protein 6.0 - 8.3 g/dL 6.6 7.0 7.1  Albumin 3.5 - 5.2 g/dL 3.7 3.2(L) 3.2(L)  AST 0 - 37 U/L 19 16 22  ALT 0 - 35 U/L 28 28 37  Alk Phosphatase 39 - 117 U/L 101 79 92  Total Bilirubin 0.2 - 1.2 mg/dL 0.6 0.4 0.7  Bilirubin, Direct 0.0 - 0.2 mg/dL - - -      Lab Results  Component Value Date/Time   TSH 2.361 01/27/2016 02:52 AM   TSH 0.83 03/24/2014 11:14 AM   TSH 0.88 09/10/2013 02:41 PM    CBC Latest Ref Rng & Units 09/04/2020 08/23/2020 08/22/2020  WBC 4.0 - 10.5 K/uL 9.0 9.7 8.1  Hemoglobin 12.0 - 15.0 g/dL 12.1 12.4 12.6  Hematocrit 36.0 - 46.0 % 37.6 39.9 40.0  Platelets 150.0 - 400.0 K/uL 236.0 230 204    Lab Results  Component Value  Date/Time   VD25OH 26 (L) 09/16/2011 10:58 AM    Clinical ASCVD: Yes  The 10-year ASCVD risk score Mikey Bussing DC Jr., et al., 2013) is: 35.4%   Values used to calculate the score:     Age: 89 years     Sex: Female     Is Non-Hispanic African American: No     Diabetic: Yes     Tobacco smoker: No     Systolic Blood Pressure: 846 mmHg     Is BP treated: Yes     HDL Cholesterol: 42.6 mg/dL     Total Cholesterol: 145 mg/dL    Social History   Tobacco Use  Smoking Status Passive Smoke Exposure - Never Smoker  Smokeless Tobacco Never Used   BP Readings from Last 3 Encounters:  09/05/20 (!) 160/70  09/04/20 (!) 166/68  08/25/20 (!) 147/74   Pulse Readings from Last 3 Encounters:  09/05/20 92  09/04/20 99  08/25/20 71   Wt Readings from Last 3 Encounters:  09/05/20 178 lb 6 oz (80.9 kg)  09/04/20 178 lb (80.7 kg)  08/22/20 176 lb 2.4 oz (79.9 kg)    Assessment: Review of patient past medical history, allergies, medications, health status, including review of consultants reports, laboratory and other test data, was performed as part of comprehensive evaluation and provision of chronic care management services.   SDOH:  (Social Determinants of Health) assessments and interventions performed:    CCM Care Plan  Allergies  Allergen Reactions  . Prednisone Other (See Comments)    REACTION: mood swings, nightmares. "Shot doesn't bother me, reaction is just with the pill" she states she has had the steroid injections before. From our records methylprednisone was given to her in 2013 without any complications.    Medications Reviewed Today    Reviewed by [name], PharmD (Pharmacist) on 10/27/20 at 1457  Med List Status: <None>  Medication Order Taking? Sig Documenting Provider Last Dose Status Informant  albuterol (VENTOLIN HFA) 108 (90 Base) MCG/ACT inhaler [id number] Yes INHALE 2 PUFFS INTO THE LUNGS EVERY EIGHT HOURS AS NEEDED FOR WHEEZING OR SHORTNESS OF BREATH. Jonetta Osgood, MD Taking Active   aspirin EC 81 MG tablet [id number] Yes Take 81 mg by mouth daily. [provider] Taking Active Self  benzonatate (TESSALON) 200 MG capsule [id number] Yes TAKE 1 CAPSULE (200 MG TOTAL) BY MOUTH THREE TIMES DAILY AS NEEDED FOR COUGH. Ghimire, Henreitta Leber, MD Taking Active   calcium carbonate (TUMS EX) 750 MG chewable tablet [id number] Yes Chew 1 tablet by mouth daily as needed for heartburn. [provider] Taking Active   Cholecalciferol (VITAMIN D3) 1000 UNITS CAPS [id number] Yes Take 1,000 Units by mouth daily. [provider] Taking Active Self  dapagliflozin propanediol (FARXIGA) 10 MG TABS tablet [id number] Yes Take 1 tablet (10 mg total) by mouth daily. Shamleffer, Melanie Crazier, MD Taking Active Self  dexamethasone (DECADRON) 6 MG tablet [id number] Yes TAKE 1 TABLET (6 MG TOTAL) BY MOUTH DAILY FOR 5 DAYS.  Jonetta Osgood, MD Taking Active   Dulaglutide (TRULICITY) 1.5 XQ/1.1HE [facility] [id number] Yes Inject 1.5 mg into the skin once a week. Shamleffer, Melanie Crazier, MD Taking Active   Fexofenadine HCl [facility] ALLERGY PO) [id number] Yes Take 1 tablet by mouth daily. [provider] Taking Active Self  FLUoxetine (PROZAC) 40 MG capsule [id number] Yes TAKE 1 CAPSULE BY MOUTH EVERY DAY  Patient taking differently: Take 40 mg by mouth daily.   Copland, Gay Filler, MD Taking Active   fluticasone (FLONASE) 50 MCG/ACT nasal spray [id number] Yes SPRAY 2 SPRAYS INTO EACH NOSTRIL EVERY DAY  Patient taking differently: Place 2 sprays into both nostrils daily as needed for allergies.   Collene Gobble, MD Taking Active   furosemide (LASIX) 20 MG tablet [id number] Yes TAKE 1 TABLET BY MOUTH EVERY DAY  Patient taking differently: Take 20 mg by mouth daily.   Burtis Junes, NP Taking Active   gabapentin (NEURONTIN) 300 MG capsule [id number] Yes Take 2 capsules (600 mg total) by mouth 2 (two) times daily. Copland, Gay Filler, MD Taking Active    hydrALAZINE (APRESOLINE) 25 MG tablet [id number] Yes Patient needs to schedule appointment for any future refills.  Please call [phone].  1st attempt.  Patient taking differently: Take 25 mg by mouth 2 (two) times daily.   Burtis Junes, NP Taking Active Self  hydrOXYzine (ATARAX/VISTARIL) 25 MG tablet [id number] Yes TAKE 1/2 TO 1 TABLET BY MOUTH EVERY 8 HOURS AS NEEDED FOR ITCHING  Patient taking differently: Take 12.5-25 mg by mouth every 8 (eight) hours as needed for itching.   Copland, Gay Filler, MD Taking Active   Insulin Glargine [facility] KWIKPEN) 100 UNIT/ML [id number] Yes Inject 30 Units into the skin daily. Shamleffer, Melanie Crazier, MD Taking Active   Insulin Pen Needle 32G X 4 MM MISC [id number] Yes 1 Device by Does not apply route daily. Shamleffer, Melanie Crazier, MD Taking Active   magnesium oxide (MAG-OX) 400 MG tablet [id number] Yes Take 400 mg by mouth daily. [provider] Taking Active Self  metFORMIN (GLUCOPHAGE-XR) 500 MG 24 hr tablet [id number] Yes Take 2 tablets (1,000 mg total) by mouth 2 (two) times daily. Shamleffer, Melanie Crazier, MD Taking Active Self  metoprolol succinate (TOPROL-XL) 100 MG 24 hr tablet [id number] Yes TAKE 1 TABLET BY MOUTH EVERY DAY  Patient taking differently: Take 100 mg by mouth daily.   Copland, Gay Filler, MD Taking Active   montelukast (SINGULAIR) 10 MG tablet [id number] Yes TAKE 1 TABLET BY MOUTH EVERY DAY AS NEEDED FOR ALLERGIES  Patient taking differently: Take 10 mg by mouth at bedtime.   Copland, Gay Filler, MD Taking Active   nitroGLYCERIN (NITROSTAT) 0.4 MG SL tablet [id number] Yes Place 1 tablet (0.4 mg total) under the tongue every 5 (five) minutes as needed for chest pain. Copland, Gay Filler, MD Taking Active Self           Med Note Luciano Cutter   Tue Aug 22, 2020 10:43 AM)    Potassium 99 MG TABS [id number] Yes Take 1 tablet by mouth daily. [provider] Taking Active Self  rosuvastatin (CRESTOR) 20 MG  tablet [id number] Yes TAKE 1 TABLET BY MOUTH EVERY DAY  Patient taking differently: Take 20 mg by mouth every evening.   Copland, Gay Filler, MD Taking Active   Tiotropium Bromide Monohydrate (SPIRIVA RESPIMAT) 2.5 MCG/ACT AERS [id number] No Inhale 2 puffs into the lungs daily.  Patient not taking: Reported on 10/27/2020   Kathlene November  E, MD Not Taking Active   traZODone (DESYREL) 50 MG tablet [id number] Yes TAKE 1/2 TO 1 TABLET BY MOUTH AT BEDTIME AS NEEDED FOR SLEEP  Patient taking differently: Take 25-50 mg by mouth at bedtime as needed for sleep.   Copland, Gay Filler, MD Taking Active Self  valsartan (DIOVAN) 160 MG tablet [id number] Yes Take 1 tablet (160 mg total) by mouth daily. Copland, Gay Filler, MD Taking Active   zinc gluconate 50 MG tablet [id number] Yes Take 50 mg by mouth daily. [provider] Taking Active Self          Patient Active Problem List   Diagnosis Date Noted  . Pneumonia due to COVID-19 virus 08/21/2020  . Hyperlipidemia associated with type 2 diabetes mellitus ([place]) 08/21/2020  . Uncontrolled type 2 diabetes mellitus with complication, with long-term current use of insulin ([place]) 03/14/2020  . Type 2 diabetes mellitus with hyperglycemia, with long-term current use of insulin ([place]) 12/10/2019  . Type 2 diabetes mellitus with diabetic polyneuropathy, with long-term current use of insulin ([place]) 12/10/2019  . Obesity (BMI 30-39.9) 08/24/2018  . Depression with anxiety 08/24/2018  . CAD (coronary artery disease) 08/24/2018  . Abnormal cardiovascular stress test 08/24/2018  . Lobar pneumonia, unspecified organism ([place]) 11/13/2017  . Precordial chest pain   . Sepsis ([place]) 09/18/2017  . Chronic diastolic CHF (congestive heart failure) ([place]) 09/18/2017  . Diabetes mellitus ([place]) 09/08/2017  . Left hip pain 10/28/2016  . Chronic rhinitis 08/07/2016  . COPD with acute exacerbation ([place]) 04/25/2016  . Hypertension associated with diabetes ([place]) 01/30/2016  .  Hypokalemia 01/27/2016  . HLD (hyperlipidemia)   . Type 2 diabetes mellitus without complication, without long-term current use of insulin ([place])   . OSA (obstructive sleep apnea) 05/06/2014  . Dyspnea on exertion 09/02/2012  . Chest pain 12/10/2011  . Chronic cough 05/11/2010  . GERD 06/28/2008    Immunization History  Administered Date(s) Administered  . Fluad Quad(high Dose 65+) 09/04/2020  . Influenza Whole 04/14/2010  . Influenza, High Dose Seasonal PF 04/03/2017, 05/11/2018, 03/05/2019  . Influenza,inj,Quad PF,6+ Mos 05/10/2013, 04/05/2014, 05/22/2015, 04/25/2016  . Pneumococcal Conjugate-13 07/24/2016  . Pneumococcal Polysaccharide-23 04/05/2014, 05/22/2015  . Tdap 07/11/2014, 03/05/2019  . Zoster Recombinat (Shingrix) 03/05/2019    Conditions to be addressed/monitored: HTN, HLD, DMII, Anxiety, Depression and GERD  General Pharmacy Care Plan and Goals:   Current Barriers:  . Unable to independently afford treatment regimen . Unable to achieve control of type 2 DM, HTN   Pharmacist Clinical Goal(s):  [name] Over the next 90 days, patient will achieve adherence to monitoring guidelines and medication adherence to achieve therapeutic efficacy . achieve control of diabetes and HTN as evidenced by meeting goal as described in patient's care plan, through collaboration with PharmD and provider.   Interventions: . 1:1 collaboration with Copland, Gay Filler, MD regarding development and update of comprehensive plan of care as evidenced by provider attestation and co-signature . Inter-disciplinary care team collaboration (see longitudinal plan of care) . Comprehensive medication review performed; medication list updated in electronic medical record  Diabetes: . Uncontrolled;  current treatment:Farxiga 11m daily; Basaglar 30 units daily; Trulicity 16.7HAweekly and metformin ER 5062m- take 2 tablets twice a day . Current glucose readings: 157-225 . Denies hypoglycemic/hyperglycemic  symptoms . Educated on importance of taking all medication for diabetes as prescribed.  Collaborated with PCP and endocrinologist to improve medication access.  Applications for patient assistance for FaCruzita Lederernd Trulicity have been completed. Mailed  to patient today. Patient aware to complete applications, bring in financial records and return to office.   Hypertension: . Improving home BP since restarting ARB        Current treatment:   hydralazine 82m twice a day;   furosemide 249mdaily;  Valsartan 16069maily    metoprolol succinate 100m47mily. . Current home readings: 133/61 and 136/72 . Reviewed BP goals and improtance of obtaining BP goals to decrease risk of stroke and renal disease.   COPD: .     Current treatment:  o Spiriva 2.5mg 36m inhalation once a day;  o Albuterol inhaler as needed for shortness of breath and wheezing;  o montelukast 10mg 68my at bedtime;  o benzonatate 200mg u56m 3 times a day as needed for cough.  . one exacerbation requiring treatment in the last 6 months (related to COVID) Earlyervention:   Application for PAP completed and signed by PCP. Will be mailed to patient to complete and sign. In the meantime, patient was signed up for savings card that might lower cost of Spiriva to $0. [facility] She is to request RF from [facility] and take coupon info to them to process.   Depression with Anxiety:  . Controlled with current therapy of trazodone 50mg - 19m 0.5 tablet = 25mg qhs53muoxetine 40mg dail47mRecommend continue current regimen for depression and anxiety.  Hyperlipidemia / CAD:  . Controlled with current therapy of rosuvastatin 20mg daily54m ASA 81mg daily 63mscussed limiting intake of saturated and trans fat; continue current regimen for lipids / reduction of heart disease  Medication Assistance: Application for Farxiga, [employer] Trulicity  medication assistance program.  in process.  Anticipated assistance start date 11/12/2020.  See plan of care for additional detail.  Patient's preferred pharmacy is:  [id number] [id number] [place] [address] [place] [id number] [phone] Patient agrees to Care Plan and Follow-up.  Plan: Telephone follow up appointment with care management team member scheduled for:  1-2 months; Pharmacy assistant to check on PAP application in 1 month.  Laverda Stribling,Cherre Robinsical Pharmacist [facility]

## 2020-10-27 NOTE — Patient Instructions (Signed)
Visit Information  PATIENT GOALS: Goals Addressed   None     The patient verbalized understanding of instructions, educational materials, and care plan provided today and declined offer to receive copy of patient instructions, educational materials, and care plan.   Telephone follow up appointment with care management team member scheduled for: 1-2 months  [name], PharmD Clinical Pharmacist [facility] [phone]

## 2020-10-31 ENCOUNTER — Ambulatory Visit: Payer: Medicare Other | Admitting: *Deleted

## 2020-11-09 ENCOUNTER — Other Ambulatory Visit: Payer: Self-pay | Admitting: Family Medicine

## 2020-11-09 DIAGNOSIS — I10 Essential (primary) hypertension: Secondary | ICD-10-CM

## 2020-11-14 ENCOUNTER — Ambulatory Visit (INDEPENDENT_AMBULATORY_CARE_PROVIDER_SITE_OTHER): Payer: Medicare Other

## 2020-11-14 DIAGNOSIS — E118 Type 2 diabetes mellitus with unspecified complications: Secondary | ICD-10-CM | POA: Diagnosis not present

## 2020-11-14 DIAGNOSIS — E1165 Type 2 diabetes mellitus with hyperglycemia: Secondary | ICD-10-CM

## 2020-11-14 DIAGNOSIS — J449 Chronic obstructive pulmonary disease, unspecified: Secondary | ICD-10-CM | POA: Diagnosis not present

## 2020-11-14 DIAGNOSIS — Z794 Long term (current) use of insulin: Secondary | ICD-10-CM | POA: Diagnosis not present

## 2020-11-14 DIAGNOSIS — IMO0002 Reserved for concepts with insufficient information to code with codable children: Secondary | ICD-10-CM

## 2020-11-14 NOTE — Patient Instructions (Addendum)
Visit Information: Thank you for taking the time to speak with me today.  PATIENT GOALS: Goals Addressed            This Visit's Progress   . Monitor and Manage My Blood Sugar   On track    Timeframe:  Long-Range Goal Priority:  Medium Start Date:    08/30/20                        Expected End Date:    02/26/21                  Follow Up Date 01/11/21   Patient Goals/Self-Care Activities . continue to check blood sugars-recommended twice a day by your doctor . continue to take medications as prescribed  . take the blood sugar log to your doctor visits . notify provider for sustained elevations or Blood sugars less than 70. . attend provider visits as scheduled and contact your provider with questions or concerns . continue to work with embedded clinic pharmacist for medication questions or concerns.  . review education material regarding your diabetes. Be prepared to discuss at our next telephone visit.   Why is this important?   Checking your blood sugar at home helps to keep it from getting very high or very low.  Writing the results in a diary or log helps the doctor know how to care for you.  Your blood sugar log should have the time, date and the results.  Also, write down the amount of insulin or other medicine that you take.  Other information, like what you ate, exercise done and how you were feeling, will also be helpful.     Notes:     . Track and Manage my Respiratory Symptoms   On track    Timeframe:  Long-Range Goal Priority:  Medium Start Date:   08/30/20                        Expected End Date: 02/27/21                       Follow Up Date 01/11/21   Patient Goals/Self-Care Activities:  . keep all follow-up appointments.  . Call to schedule follow up appointment for Dr. Lorelei Pont.   . Continue to follow the COPD action plan. . Continue to take your medications as prescribed. . Complete medication assistance forms provided by Clinic pharmacist.   Why is this  important?   Tracking your symptoms and other information about your health helps your doctor plan your care.  Write down the symptoms, the time of day, what you were doing and what medicine you are taking.  You will soon learn how to manage your symptoms.     Notes:       Patient verbalizes understanding of instructions provided today and agrees to view in [place].   Telephone follow up appointment with care management team member scheduled for: January 11, 2021 The patient has been provided with contact information for the care management team and has been advised to call with any health related questions or concerns.   Thea Silversmith, RN, MSN, BSN, CCM Care Management Coordinator [facility] [phone]

## 2020-11-14 NOTE — Chronic Care Management (AMB) (Signed)
Chronic Care Management   CCM RN Visit Note  11/14/2020 Name: Destiny Ballard MRN: [id number] DOB: Jan 20, 1951  Subjective: Destiny Ballard is a 70 y.o. year old female who is a primary care patient of Copland, Gay Filler, MD. The care management team was consulted for assistance with disease management and care coordination needs.    Engaged with patient by telephone for follow up visit in response to provider referral for case management and/or care coordination services.   Consent to Services:  The patient was given information about Chronic Care Management services, agreed to services, and gave verbal consent prior to initiation of services.  Please see initial visit note for detailed documentation.   Patient agreed to services and verbal consent obtained.   Assessment: Review of patient past medical history, allergies, medications, health status, including review of consultants reports, laboratory and other test data, was performed as part of comprehensive evaluation and provision of chronic care management services.   SDOH (Social Determinants of Health) assessments and interventions performed:    CCM Care Plan  Allergies  Allergen Reactions  . Prednisone Other (See Comments)    REACTION: mood swings, nightmares. "Shot doesn't bother me, reaction is just with the pill" she states she has had the steroid injections before. From our records methylprednisone was given to her in 2013 without any complications.    Outpatient Encounter Medications as of 11/14/2020  Medication Sig Note  . albuterol (VENTOLIN HFA) 108 (90 Base) MCG/ACT inhaler INHALE 2 PUFFS INTO THE LUNGS EVERY EIGHT HOURS AS NEEDED FOR WHEEZING OR SHORTNESS OF BREATH.   [name] aspirin EC 81 MG tablet Take 81 mg by mouth daily.   . benzonatate (TESSALON) 200 MG capsule TAKE 1 CAPSULE (200 MG TOTAL) BY MOUTH THREE TIMES DAILY AS NEEDED FOR COUGH.   . calcium carbonate (TUMS EX) 750 MG chewable tablet Chew 1 tablet by mouth  daily as needed for heartburn.   . Cholecalciferol (VITAMIN D3) 1000 UNITS CAPS Take 1,000 Units by mouth daily.   . dapagliflozin propanediol (FARXIGA) 10 MG TABS tablet Take 1 tablet (10 mg total) by mouth daily.   . Dulaglutide (TRULICITY) 1.5 PP/2.9JJ SOPN Inject 1.5 mg into the skin once a week.   [name] Fexofenadine HCl (ALLEGRA ALLERGY PO) Take 1 tablet by mouth daily.   [name] FLUoxetine (PROZAC) 40 MG capsule TAKE 1 CAPSULE BY MOUTH EVERY DAY (Patient taking differently: Take 40 mg by mouth daily.)   . fluticasone (FLONASE) 50 MCG/ACT nasal spray SPRAY 2 SPRAYS INTO EACH NOSTRIL EVERY DAY (Patient taking differently: Place 2 sprays into both nostrils daily as needed for allergies.)   . furosemide (LASIX) 20 MG tablet TAKE 1 TABLET BY MOUTH EVERY DAY (Patient taking differently: Take 20 mg by mouth daily.)   . gabapentin (NEURONTIN) 300 MG capsule Take 2 capsules (600 mg total) by mouth 2 (two) times daily.   . hydrALAZINE (APRESOLINE) 25 MG tablet Patient needs to schedule appointment for any future refills.  Please call [phone].  1st attempt. (Patient taking differently: Take 25 mg by mouth 2 (two) times daily.)   . hydrOXYzine (ATARAX/VISTARIL) 25 MG tablet TAKE 1/2 TO 1 TABLET BY MOUTH EVERY 8 HOURS AS NEEDED FOR ITCHING (Patient taking differently: Take 12.5-25 mg by mouth every 8 (eight) hours as needed for itching.) 11/14/2020: Reports takes one tablet every day.  . Insulin Glargine (BASAGLAR KWIKPEN) 100 UNIT/ML Inject 30 Units into the skin daily.   . magnesium oxide (MAG-OX) 400 MG tablet  Take 400 mg by mouth daily.   . metFORMIN (GLUCOPHAGE-XR) 500 MG 24 hr tablet Take 2 tablets (1,000 mg total) by mouth 2 (two) times daily.   . metoprolol succinate (TOPROL-XL) 100 MG 24 hr tablet Take 1 tablet (100 mg total) by mouth daily.   . montelukast (SINGULAIR) 10 MG tablet TAKE 1 TABLET BY MOUTH EVERY DAY AS NEEDED FOR ALLERGIES (Patient taking differently: Take 10 mg by mouth at bedtime.)   .  nitroGLYCERIN (NITROSTAT) 0.4 MG SL tablet Place 1 tablet (0.4 mg total) under the tongue every 5 (five) minutes as needed for chest pain.   [name] Potassium 99 MG TABS Take 1 tablet by mouth daily.   . rosuvastatin (CRESTOR) 20 MG tablet TAKE 1 TABLET BY MOUTH EVERY DAY (Patient taking differently: Take 20 mg by mouth every evening.)   . traZODone (DESYREL) 50 MG tablet TAKE 1/2 TO 1 TABLET BY MOUTH AT BEDTIME AS NEEDED FOR SLEEP (Patient taking differently: Take 25-50 mg by mouth at bedtime as needed for sleep.)   . valsartan (DIOVAN) 160 MG tablet Take 1 tablet (160 mg total) by mouth daily.   [name] zinc gluconate 50 MG tablet Take 50 mg by mouth daily.   [name] dexamethasone (DECADRON) 6 MG tablet TAKE 1 TABLET (6 MG TOTAL) BY MOUTH DAILY FOR 5 DAYS. (Patient not taking: Reported on 11/14/2020)   . Insulin Pen Needle 32G X 4 MM MISC 1 Device by Does not apply route daily.   . Tiotropium Bromide Monohydrate (SPIRIVA RESPIMAT) 2.5 MCG/ACT AERS Inhale 2 puffs into the lungs daily. (Patient not taking: No sig reported)    No facility-administered encounter medications on file as of 11/14/2020.    Patient Active Problem List   Diagnosis Date Noted  . Pneumonia due to COVID-19 virus 08/21/2020  . Hyperlipidemia associated with type 2 diabetes mellitus ([place]) 08/21/2020  . Uncontrolled type 2 diabetes mellitus with complication, with long-term current use of insulin ([place]) 03/14/2020  . Type 2 diabetes mellitus with hyperglycemia, with long-term current use of insulin ([place]) 12/10/2019  . Type 2 diabetes mellitus with diabetic polyneuropathy, with long-term current use of insulin ([place]) 12/10/2019  . Obesity (BMI 30-39.9) 08/24/2018  . Depression with anxiety 08/24/2018  . CAD (coronary artery disease) 08/24/2018  . Abnormal cardiovascular stress test 08/24/2018  . Lobar pneumonia, unspecified organism ([place]) 11/13/2017  . Precordial chest pain   . Sepsis ([place]) 09/18/2017  . Chronic diastolic CHF (congestive heart  failure) ([place]) 09/18/2017  . Diabetes mellitus ([place]) 09/08/2017  . Left hip pain 10/28/2016  . Chronic rhinitis 08/07/2016  . COPD with acute exacerbation ([place]) 04/25/2016  . Hypertension associated with diabetes ([place]) 01/30/2016  . Hypokalemia 01/27/2016  . HLD (hyperlipidemia)   . Type 2 diabetes mellitus without complication, without long-term current use of insulin ([place])   . OSA (obstructive sleep apnea) 05/06/2014  . Dyspnea on exertion 09/02/2012  . Chest pain 12/10/2011  . Chronic cough 05/11/2010  . GERD 06/28/2008    Conditions to be addressed/monitored:COPD and DMII  Care Plan : COPD (Adult)  Updates made by Luretha Rued, RN since 11/14/2020 12:00 AM    Problem: Symptom Exacerbation (COPD) due to Covid pneumonia   Priority: Medium    Long-Range Goal: Symptom Exacerbation Prevented or Minimized   Start Date: 08/30/2020  Expected End Date: 02/26/2021  This Visit's Progress: On track  Recent Progress: On track  Priority: Medium  Note:   Current Barriers:  [name] Knowledge deficits related to  long term plan for self management of COPD-recent hospitalization 08/21/20-08/23/20 due to Covid pneumonia. Reports improved strength. Denies any issues with breathing at this time.  Case Manager Clinical Goal(s):  patient will be able to verbalize understanding of COPD action plan and when to seek appropriate levels of medical care  Interventions:  . Collaboration with Copland, Gay Filler, MD regarding development and update of comprehensive plan of care as evidenced by provider attestation and co-signature . Inter-disciplinary care team collaboration (see longitudinal plan of care) . Reiterated COPD Action Plan zones with client.  Advised patient to self assesses COPD action plan zone and make appointment with provider if in the yellow zone for 48 hours without improvement.  Medications reviewed-client denies any issues at this time  Confirmed with client that she received EMMI video  sent re: COPD. Client states she has viewed video.  Reviewed upcoming appointments  Confirmed client received medication assistance forms provided by Clinic pharmacist  Encouraged client to continue to work with Clinic pharmacist and complete medication assistance forms.  Discussed Covid -19 Vaccine. RNCM reviewed locations where client can obtain the vaccine. Patient Goals/Self-Care Activities:  . keep all follow-up appointments.  . Call to schedule follow up appointment for Dr. Lorelei Pont.   . Continue to follow the COPD action plan. . Continue to take your medications as prescribed. . Complete medication assistance forms provided by Clinic pharmacist. Follow Up Plan: The care management team will reach out to the patient again over the next 60 days.     Care Plan : Diabetes Type 2 (Adult)  Updates made by Luretha Rued, RN since 11/14/2020 12:00 AM    Problem: Disease Progression (Diabetes, Type 2)   Priority: Medium    Long-Range Goal: Disease Progression Prevented or Minimized   Start Date: 08/30/2020  Expected End Date: 02/26/2021  This Visit's Progress: On track  Recent Progress: On track  Priority: Medium  Note:   Objective:  Lab Results  Component Value Date   HGBA1C 9.3 (H) 08/22/2020 .   Lab Results  Component Value Date   CREATININE 0.82 08/23/2020   CREATININE 0.68 08/22/2020   CREATININE 0.69 08/21/2020   Current Barriers:  [name] Knowledge Deficits related to long term self care management of Diabetes. Client reports has been checking blood sugar once a day. Blood sugar last checked was last night. However, Client checked blood sugar during telephonic visit and blood sugar was 141 after meals. She denies any blood sugar readings less than 70. She reports she has all her medications and has received medication assistance forms provided by embedded clinic pharmacist. Client reports blood pressure 133/61 when last checked at [facility] last week.  Case Manager Clinical  Goal(s):  . patient will demonstrate improved adherence to prescribed treatment plan for diabetes self care/management as evidenced by: scheduling an appointment with endocrinologist . contact provider for new or worsened symptoms or questions. Interventions:  . Collaboration with Copland, Gay Filler, MD regarding development and update of comprehensive plan of care as evidenced by provider attestation and co-signature . Inter-disciplinary care team collaboration (see longitudinal plan of care) . Medications reviewed with client. . Reviewed after visit summary from last endocrinologist visit . Reinforced Endocrinologist recommended checking blood sugar check twice a day. . Discussed plans with patient for ongoing care management follow up and provided patient with direct contact information for care management team . Reviewed scheduled/upcoming provider appointments. Next visit with endocrinologist scheduled for 01/09/21  . Encouraged client to complete medication assistance forms  provided by embedded clinic pharmacist. . Confirmed with client that she received Emmi video. Denies any question . Send education related to diabetes Patient Goals/Self-Care Activities . continue to check blood sugars-recommended twice a day by your doctor . continue to take medications as prescribed  . take the blood sugar log to your doctor visits . notify provider for sustained elevations or Blood sugars less than 70. . attend provider visits as scheduled and contact your provider with questions or concerns . continue to work with embedded clinic pharmacist for medication questions or concerns.  . review education material regarding your diabetes. Be prepared to discuss at our next telephone visit. Follow Up Plan: The care management team will reach out to the patient again over the next 60 days.       Plan:The patient has been provided with contact information for the care management team and has been advised  to call with any health related questions or concerns.  and The care management team will reach out to the patient again over the next 60 days.   Thea Silversmith, RN, MSN, BSN, CCM Care Management Coordinator [facility] [phone]

## 2020-11-18 ENCOUNTER — Emergency Department (HOSPITAL_BASED_OUTPATIENT_CLINIC_OR_DEPARTMENT_OTHER): Payer: Medicare Other

## 2020-11-18 ENCOUNTER — Encounter (HOSPITAL_BASED_OUTPATIENT_CLINIC_OR_DEPARTMENT_OTHER): Payer: Self-pay | Admitting: Emergency Medicine

## 2020-11-18 ENCOUNTER — Other Ambulatory Visit: Payer: Self-pay

## 2020-11-18 ENCOUNTER — Emergency Department (HOSPITAL_BASED_OUTPATIENT_CLINIC_OR_DEPARTMENT_OTHER)
Admission: EM | Admit: 2020-11-18 | Discharge: 2020-11-18 | Disposition: A | Discharge status: Home / Self Care | Payer: Medicare Other | Attending: Emergency Medicine | Admitting: Emergency Medicine

## 2020-11-18 DIAGNOSIS — Z7982 Long term (current) use of aspirin: Secondary | ICD-10-CM | POA: Diagnosis not present

## 2020-11-18 DIAGNOSIS — I5032 Chronic diastolic (congestive) heart failure: Secondary | ICD-10-CM | POA: Diagnosis not present

## 2020-11-18 DIAGNOSIS — E119 Type 2 diabetes mellitus without complications: Secondary | ICD-10-CM | POA: Insufficient documentation

## 2020-11-18 DIAGNOSIS — J441 Chronic obstructive pulmonary disease with (acute) exacerbation: Secondary | ICD-10-CM | POA: Insufficient documentation

## 2020-11-18 DIAGNOSIS — Z79899 Other long term (current) drug therapy: Secondary | ICD-10-CM | POA: Insufficient documentation

## 2020-11-18 DIAGNOSIS — Z955 Presence of coronary angioplasty implant and graft: Secondary | ICD-10-CM | POA: Insufficient documentation

## 2020-11-18 DIAGNOSIS — R0602 Shortness of breath: Secondary | ICD-10-CM | POA: Diagnosis not present

## 2020-11-18 DIAGNOSIS — I2510 Atherosclerotic heart disease of native coronary artery without angina pectoris: Secondary | ICD-10-CM | POA: Insufficient documentation

## 2020-11-18 DIAGNOSIS — Z794 Long term (current) use of insulin: Secondary | ICD-10-CM | POA: Insufficient documentation

## 2020-11-18 DIAGNOSIS — Z7984 Long term (current) use of oral hypoglycemic drugs: Secondary | ICD-10-CM | POA: Insufficient documentation

## 2020-11-18 DIAGNOSIS — Z8616 Personal history of COVID-19: Secondary | ICD-10-CM | POA: Insufficient documentation

## 2020-11-18 DIAGNOSIS — R072 Precordial pain: Secondary | ICD-10-CM | POA: Diagnosis not present

## 2020-11-18 DIAGNOSIS — R079 Chest pain, unspecified: Secondary | ICD-10-CM | POA: Diagnosis not present

## 2020-11-18 DIAGNOSIS — I110 Hypertensive heart disease with heart failure: Secondary | ICD-10-CM | POA: Insufficient documentation

## 2020-11-18 DIAGNOSIS — Z7722 Contact with and (suspected) exposure to environmental tobacco smoke (acute) (chronic): Secondary | ICD-10-CM | POA: Diagnosis not present

## 2020-11-18 LAB — COMPREHENSIVE METABOLIC PANEL
ALT: 20 U/L (ref 0–44)
AST: 20 U/L (ref 15–41)
Albumin: 3.6 g/dL (ref 3.5–5.0)
Alkaline Phosphatase: 78 U/L (ref 38–126)
Anion gap: 11 (ref 5–15)
BUN: 10 mg/dL (ref 8–23)
CO2: 27 mmol/L (ref 22–32)
Calcium: 9.3 mg/dL (ref 8.9–10.3)
Chloride: 99 mmol/L (ref 98–111)
Creatinine, Ser: 0.61 mg/dL (ref 0.44–1.00)
GFR, Estimated: >60 mL/min (ref >60)
Glucose, Bld: 123 mg/dL — ABNORMAL HIGH (ref 70–99)
Potassium: 3.8 mmol/L (ref 3.5–5.1)
Sodium: 137 mmol/L (ref 135–145)
Total Bilirubin: 0.3 mg/dL (ref 0.3–1.2)
Total Protein: 7.2 g/dL (ref 6.5–8.1)

## 2020-11-18 LAB — TROPONIN I (HIGH SENSITIVITY)
Troponin I (High Sensitivity): 3 ng/L (ref <18)
Troponin I (High Sensitivity): 3 ng/L (ref <18)

## 2020-11-18 LAB — CBC WITH DIFFERENTIAL/PLATELET
Abs Immature Granulocytes: 0.06 10*3/uL (ref 0.00–0.07)
Basophils Absolute: 0 10*3/uL (ref 0.0–0.1)
Basophils Relative: 0 %
Eosinophils Absolute: 0.3 10*3/uL (ref 0.0–0.5)
Eosinophils Relative: 2 %
HCT: 39.6 % (ref 36.0–46.0)
Hemoglobin: 12.6 g/dL (ref 12.0–15.0)
Immature Granulocytes: 1 %
Lymphocytes Relative: 28 %
Lymphs Abs: 3 10*3/uL (ref 0.7–4.0)
MCH: 25.2 pg — ABNORMAL LOW (ref 26.0–34.0)
MCHC: 31.8 g/dL (ref 30.0–36.0)
MCV: 79.2 fL — ABNORMAL LOW (ref 80.0–100.0)
Monocytes Absolute: 0.9 10*3/uL (ref 0.1–1.0)
Monocytes Relative: 8 %
Neutro Abs: 6.6 10*3/uL (ref 1.7–7.7)
Neutrophils Relative %: 61 %
Platelets: 287 10*3/uL (ref 150–400)
RBC: 5 MIL/uL (ref 3.87–5.11)
RDW: 14.3 % (ref 11.5–15.5)
WBC: 10.8 10*3/uL — ABNORMAL HIGH (ref 4.0–10.5)
nRBC: 0 % (ref 0.0–0.2)

## 2020-11-18 LAB — LIPASE, BLOOD: Lipase: 37 U/L (ref 11–51)

## 2020-11-18 LAB — URINALYSIS, ROUTINE W REFLEX MICROSCOPIC
Bilirubin Urine: NEGATIVE
Glucose, UA: >=500 mg/dL — AB
Hgb urine dipstick: NEGATIVE
Ketones, ur: NEGATIVE mg/dL
Leukocytes,Ua: NEGATIVE
Nitrite: NEGATIVE
Protein, ur: NEGATIVE mg/dL
Specific Gravity, Urine: 1.01 (ref 1.005–1.030)
pH: 6.5 (ref 5.0–8.0)

## 2020-11-18 LAB — URINALYSIS, MICROSCOPIC (REFLEX): Bacteria, UA: NONE SEEN

## 2020-11-18 LAB — CBG MONITORING, ED: Glucose-Capillary: 130 mg/dL — ABNORMAL HIGH (ref 70–99)

## 2020-11-18 LAB — D-DIMER, QUANTITATIVE: D-Dimer, Quant: 0.34 ug/mL-FEU (ref 0.00–0.50)

## 2020-11-18 MED ORDER — ONDANSETRON HCL 4 MG/2ML IJ SOLN
4.0000 mg | Freq: Once | INTRAMUSCULAR | Status: AC
  Administered 2020-11-18: 4 mg via INTRAVENOUS
  Filled 2020-11-18: qty 2

## 2020-11-18 MED ORDER — NITROGLYCERIN 0.4 MG SL SUBL
0.4000 mg | SUBLINGUAL_TABLET | SUBLINGUAL | Status: DC | PRN
  Administered 2020-11-18 (×2): 0.4 mg via SUBLINGUAL
  Filled 2020-11-18: qty 1

## 2020-11-18 MED ORDER — ASPIRIN 81 MG PO CHEW
324.0000 mg | CHEWABLE_TABLET | Freq: Once | ORAL | Status: AC
  Administered 2020-11-18: 324 mg via ORAL
  Filled 2020-11-18: qty 4

## 2020-11-18 NOTE — ED Notes (Signed)
RN@bedside drawing labs 

## 2020-11-18 NOTE — ED Provider Notes (Signed)
[facility] EMERGENCY DEPARTMENT Provider Note   CSN: [id number] Arrival date & time: 11/18/20  1739     History Chief Complaint  Patient presents with  . Chest Pain    Destiny Ballard is a 70 y.o. female.  The history is provided by the patient, medical records and a relative.  Chest Pain  Destiny Ballard is a 70 y.o. female who presents to the Emergency Department complaining of chest pain. She presents the emergency department accompanied by her daughter for evaluation of chest pain. She states that since this morning she has been feeling nauseated about one hour prior to ED arrival she developed some paresthesias to the left upper extremity. Just prior to ED presentation she does developed central chest pressure with associated shortness of breath. She feels unwell in general. No fevers, vomiting, abdominal pain, diarrhea. She does have mild bilateral lower extremity edema. She does have a history of coronary artery disease, this feels different than prior episodes requiring a stent. She also has a history of COPD, diabetes. No prior history of DVT/PE. No recent surgeries. She does not take hormones. No prior abdominal surgeries.    Past Medical History:  Diagnosis Date  . Anxiety    Prior suicide attempt  . CAD (coronary artery disease)    a) s/p DES to LAD 07/2005 b) Last Myoview low risk 11/2011 showing small fixed apical perfusion defect (prior MI vs attenuation) but no ischemia - normal EF.  [name] Cervical spondylosis   . Coronary atherosclerosis 06/28/2008  . Depression   . Depression with anxiety   . Diabetes mellitus without complication ([facility])   . GERD (gastroesophageal reflux disease)   . Hyperlipidemia   . Hypertension   . Insulin resistance   . Iron deficiency anemia   . Obesity     Patient Active Problem List   Diagnosis Date Noted  . Pneumonia due to COVID-19 virus 08/21/2020  . Hyperlipidemia associated with type 2 diabetes mellitus ([place]) 08/21/2020   . Uncontrolled type 2 diabetes mellitus with complication, with long-term current use of insulin ([place]) 03/14/2020  . Type 2 diabetes mellitus with hyperglycemia, with long-term current use of insulin ([place]) 12/10/2019  . Type 2 diabetes mellitus with diabetic polyneuropathy, with long-term current use of insulin ([place]) 12/10/2019  . Obesity (BMI 30-39.9) 08/24/2018  . Depression with anxiety 08/24/2018  . CAD (coronary artery disease) 08/24/2018  . Abnormal cardiovascular stress test 08/24/2018  . Lobar pneumonia, unspecified organism ([place]) 11/13/2017  . Precordial chest pain   . Sepsis ([place]) 09/18/2017  . Chronic diastolic CHF (congestive heart failure) ([place]) 09/18/2017  . Diabetes mellitus ([place]) 09/08/2017  . Left hip pain 10/28/2016  . Chronic rhinitis 08/07/2016  . COPD with acute exacerbation ([place]) 04/25/2016  . Hypertension associated with diabetes ([place]) 01/30/2016  . Hypokalemia 01/27/2016  . HLD (hyperlipidemia)   . Type 2 diabetes mellitus without complication, without long-term current use of insulin ([place])   . OSA (obstructive sleep apnea) 05/06/2014  . Dyspnea on exertion 09/02/2012  . Chest pain 12/10/2011  . Chronic cough 05/11/2010  . GERD 06/28/2008    Past Surgical History:  Procedure Laterality Date  . BREAST ENHANCEMENT SURGERY    . CARDIAC CATHETERIZATION  06/17/2007   NORMAL. EF 60%  . CARDIAC CATHETERIZATION N/A 01/29/2016   Procedure: Left Heart Cath and Coronary Angiography;  Surgeon: Sherren Mocha, MD;  Location: [facility];  Service: Cardiovascular;  Laterality: N/A;  . CERVICAL SPONDYLOSIS  SINGLE LEVEL FUSION  . CHILDBIRTH     X3  . CORONARY STENT PLACEMENT  07/2005   LEFT ANTERIOR DESCENDING  . FOREARM FRACTURE SURGERY  2010   hand and shoulder   . INCISION AND DRAINAGE BREAST ABSCESS  01/05/2012      . INCISION AND DRAINAGE PERIRECTAL ABSCESS N/A 02/18/2014   Procedure: IRRIGATION AND DEBRIDEMENT PERIRECTAL ABSCESS;  Surgeon: Pedro Earls, MD;  Location: [facility];  Service: General;  Laterality: N/A;  . LEFT HEART CATH AND CORONARY ANGIOGRAPHY N/A 10/10/2017   Procedure: LEFT HEART CATH AND CORONARY ANGIOGRAPHY;  Surgeon: Burnell Blanks, MD;  Location: [facility];  Service: Cardiovascular;  Laterality: N/A;  . LUMBAR LAMINECTOMY    . ROTATOR CUFF REPAIR     [place]  . TONSILLECTOMY AND ADENOIDECTOMY    . TUBAL LIGATION    . VIDEO BRONCHOSCOPY Bilateral 08/13/2016   Procedure: VIDEO BRONCHOSCOPY WITHOUT FLUORO;  Surgeon: Collene Gobble, MD;  Location: [facility];  Service: Cardiopulmonary;  Laterality: Bilateral;     OB History   No obstetric history on file.     Family History  Problem Relation Age of Onset  . Heart attack Mother   . Diabetes Mother   . Lung cancer Mother   . Asthma Mother   . Heart disease Mother   . Suicidality Father        "killed himself"  . Asthma Daughter        x 2  . Cancer Daughter        pre-cancerous polyp  . Diabetes Sister   . Cancer Sister   . Cervical cancer Daughter        cervical   . Allergies Other        all family--seasonal allergies    Social History   Tobacco Use  . Smoking status: Passive Smoke Exposure - Never Smoker  . Smokeless tobacco: Never Used  Vaping Use  . Vaping Use: Never used  Substance Use Topics  . Alcohol use: Yes    Comment: occ  . Drug use: No    Home Medications Prior to Admission medications   Medication Sig Start Date End Date Taking? Authorizing Provider  albuterol (VENTOLIN HFA) 108 (90 Base) MCG/ACT inhaler INHALE 2 PUFFS INTO THE LUNGS EVERY EIGHT HOURS AS NEEDED FOR WHEEZING OR SHORTNESS OF BREATH. 08/23/20 08/23/21  Ghimire, Henreitta Leber, MD  aspirin EC 81 MG tablet Take 81 mg by mouth daily.    [provider]  benzonatate (TESSALON) 200 MG capsule TAKE 1 CAPSULE (200 MG TOTAL) BY MOUTH THREE TIMES DAILY AS NEEDED FOR COUGH. 08/23/20 08/23/21  Ghimire, Henreitta Leber, MD  calcium carbonate (TUMS EX) 750 MG  chewable tablet Chew 1 tablet by mouth daily as needed for heartburn.    [provider]  Cholecalciferol (VITAMIN D3) 1000 UNITS CAPS Take 1,000 Units by mouth daily.    [provider]  dapagliflozin propanediol (FARXIGA) 10 MG TABS tablet Take 1 tablet (10 mg total) by mouth daily. 12/10/19   Shamleffer, Melanie Crazier, MD  dexamethasone (DECADRON) 6 MG tablet TAKE 1 TABLET (6 MG TOTAL) BY MOUTH DAILY FOR 5 DAYS. Patient not taking: Reported on 11/14/2020 08/23/20 08/23/21  Jonetta Osgood, MD  Dulaglutide (TRULICITY) 1.5 ZO/1.0RU SOPN Inject 1.5 mg into the skin once a week. 09/05/20   Shamleffer, Melanie Crazier, MD  Fexofenadine HCl (ALLEGRA ALLERGY PO) Take 1 tablet by mouth daily.    [provider]  FLUoxetine (PROZAC) 40 MG capsule TAKE 1 CAPSULE BY MOUTH EVERY DAY Patient taking differently: Take 40 mg by mouth daily. 02/28/20   Copland, Gwenlyn Found, MD  fluticasone (FLONASE) 50 MCG/ACT nasal spray SPRAY 2 SPRAYS INTO EACH NOSTRIL EVERY DAY Patient taking differently: Place 2 sprays into both nostrils daily as needed for allergies. 06/08/18   Leslye Peer, MD  furosemide (LASIX) 20 MG tablet TAKE 1 TABLET BY MOUTH EVERY DAY Patient taking differently: Take 20 mg by mouth daily. 07/28/19   Rosalio Macadamia, NP  gabapentin (NEURONTIN) 300 MG capsule Take 2 capsules (600 mg total) by mouth 2 (two) times daily. 09/04/20   Copland, Gwenlyn Found, MD  hydrALAZINE (APRESOLINE) 25 MG tablet Patient needs to schedule appointment for any future refills.  Please call [phone].  1st attempt. Patient taking differently: Take 25 mg by mouth 2 (two) times daily. 02/07/20   Rosalio Macadamia, NP  hydrOXYzine (ATARAX/VISTARIL) 25 MG tablet TAKE 1/2 TO 1 TABLET BY MOUTH EVERY 8 HOURS AS NEEDED FOR ITCHING Patient taking differently: Take 12.5-25 mg by mouth every 8 (eight) hours as needed for itching. 12/22/19   Copland, Gwenlyn Found, MD  Insulin Glargine (BASAGLAR KWIKPEN) 100 UNIT/ML  Inject 30 Units into the skin daily. 09/05/20   Shamleffer, Konrad Dolores, MD  Insulin Pen Needle 32G X 4 MM MISC 1 Device by Does not apply route daily. 09/05/20   Shamleffer, Konrad Dolores, MD  magnesium oxide (MAG-OX) 400 MG tablet Take 400 mg by mouth daily.    [provider]  metFORMIN (GLUCOPHAGE-XR) 500 MG 24 hr tablet Take 2 tablets (1,000 mg total) by mouth 2 (two) times daily. 08/15/20   Shamleffer, Konrad Dolores, MD  metoprolol succinate (TOPROL-XL) 100 MG 24 hr tablet Take 1 tablet (100 mg total) by mouth daily. 11/09/20   Copland, Gwenlyn Found, MD  montelukast (SINGULAIR) 10 MG tablet TAKE 1 TABLET BY MOUTH EVERY DAY AS NEEDED FOR ALLERGIES Patient taking differently: Take 10 mg by mouth at bedtime. 10/20/19   Copland, Gwenlyn Found, MD  nitroGLYCERIN (NITROSTAT) 0.4 MG SL tablet Place 1 tablet (0.4 mg total) under the tongue every 5 (five) minutes as needed for chest pain. 09/01/17   Copland, Gwenlyn Found, MD  Potassium 99 MG TABS Take 1 tablet by mouth daily.    [provider]  rosuvastatin (CRESTOR) 20 MG tablet TAKE 1 TABLET BY MOUTH EVERY DAY Patient taking differently: Take 20 mg by mouth every evening. 06/10/20   Copland, Gwenlyn Found, MD  Tiotropium Bromide Monohydrate (SPIRIVA RESPIMAT) 2.5 MCG/ACT AERS Inhale 2 puffs into the lungs daily. Patient not taking: No sig reported 10/19/19   [name] Plump, MD  traZODone (DESYREL) 50 MG tablet TAKE 1/2 TO 1 TABLET BY MOUTH AT BEDTIME AS NEEDED FOR SLEEP Patient taking differently: Take 25-50 mg by mouth at bedtime as needed for sleep. 01/18/20   Copland, Gwenlyn Found, MD  valsartan (DIOVAN) 160 MG tablet Take 1 tablet (160 mg total) by mouth daily. 10/03/20   Copland, Gwenlyn Found, MD  zinc gluconate 50 MG tablet Take 50 mg by mouth daily.    [provider]    Allergies    Prednisone  Review of Systems   Review of Systems  Cardiovascular: Positive for chest pain.  All other systems reviewed and are negative.   Physical  Exam Updated Vital Signs BP 130/63   Pulse 73   Temp 98.1 F (36.7 C) (Oral)   Resp 18  Ht 4\' 11"  (1.499 m)   Wt 81.6 kg   SpO2 94%   BMI 36.36 kg/m   Physical Exam Vitals and nursing note reviewed.  Constitutional:      Appearance: She is well-developed.  HENT:     Head: Normocephalic and atraumatic.  Cardiovascular:     Rate and Rhythm: Normal rate and regular rhythm.     Heart sounds: No murmur heard.   Pulmonary:     Effort: Pulmonary effort is normal. No respiratory distress.     Breath sounds: Normal breath sounds.  Abdominal:     Palpations: Abdomen is soft.     Tenderness: There is no abdominal tenderness. There is no guarding or rebound.  Musculoskeletal:        General: No tenderness.     Comments: 2+ radial pulses bilaterally. Trace edema to bilateral lower extremities  Skin:    General: Skin is warm and dry.  Neurological:     Mental Status: She is alert and oriented to person, place, and time.     Comments: Five out of five strength in all four extremities with sensation to light touch intact in all four extremities. No asymmetry of facial movements.  Psychiatric:        Behavior: Behavior normal.     ED Results / Procedures / Treatments   Labs (all labs ordered are listed, but only abnormal results are displayed) Labs Reviewed  COMPREHENSIVE METABOLIC PANEL - Abnormal; Notable for the following components:      Result Value   Glucose, Bld 123 (*)    All other components within normal limits  CBC WITH DIFFERENTIAL/PLATELET - Abnormal; Notable for the following components:   WBC 10.8 (*)    MCV 79.2 (*)    MCH 25.2 (*)    All other components within normal limits  URINALYSIS, ROUTINE W REFLEX MICROSCOPIC - Abnormal; Notable for the following components:   Glucose, UA >=500 (*)    All other components within normal limits  CBG MONITORING, ED - Abnormal; Notable for the following components:   Glucose-Capillary 130 (*)    All other components  within normal limits  LIPASE, BLOOD  D-DIMER, QUANTITATIVE  URINALYSIS, MICROSCOPIC (REFLEX)  TROPONIN I (HIGH SENSITIVITY)  TROPONIN I (HIGH SENSITIVITY)    EKG EKG Interpretation  Date/Time:  Saturday Nov 18 2020 18:16:55 EDT Ventricular Rate:  78 PR Interval:  170 QRS Duration: 98 QT Interval:  390 QTC Calculation: 445 R Axis:   45 Text Interpretation: Sinus rhythm Low voltage, extremity leads Confirmed by Quintella Reichert [phone]) on 11/18/2020 6:22:58 PM   Radiology DG Chest Port 1 View  Result Date: 11/18/2020 CLINICAL DATA:  Chest pain, shortness of breath. EXAM: PORTABLE CHEST 1 VIEW COMPARISON:  Chest radiograph dated 08/21/2020. FINDINGS: The heart size is normal. Vascular calcifications are seen in the aortic arch. Both lungs are clear. Fixation hardware overlies the lower cervical spine. IMPRESSION: No active disease. Aortic Atherosclerosis (ICD10-I70.0). Electronically Signed   By: Zerita Boers M.D.   On: 11/18/2020 19:25    Procedures Procedures   Medications Ordered in ED Medications  nitroGLYCERIN (NITROSTAT) SL tablet 0.4 mg (0.4 mg Sublingual Given 11/18/20 1857)  ondansetron (ZOFRAN) injection 4 mg (4 mg Intravenous Given 11/18/20 1837)  aspirin chewable tablet 324 mg (324 mg Oral Given 11/18/20 1836)    ED Course  I have reviewed the triage vital signs and the nursing notes.  Pertinent labs & imaging results that were available during my care of  the patient were reviewed by me and considered in my medical decision making (see chart for details).    MDM Rules/Calculators/A&P                         Patient with history of COPD, diabetes, hypertension, coronary artery disease here for evaluation of chest pain, numbness to the left upper extremity. She is neurologically intact on evaluation. EKG is without acute ischemic changes in troponin is negative times two. Presentation is not consistent with PE, D dimer is negative. Doubt dissection. Presentation is not  consistent with acute CVA. Discussed with patient recommendation for admission for further cardiac evaluation given her heart score of six and concerning history. She declines admission at this time. Plan to discharge home with close cardiology follow-up and return precautions.  Final Clinical Impression(s) / ED Diagnoses Final diagnoses:  Precordial pain    Rx / DC Orders ED Discharge Orders    None       Tilden Fossa, MD 11/18/20 2131

## 2020-11-18 NOTE — ED Triage Notes (Addendum)
Reports central cp that feels heavy.  Also endorses numbness down left arm.  Reports some SOB.  Hx of COPD.  VAN negative with equal strength noted.

## 2020-11-18 NOTE — ED Notes (Signed)
ED Provider at bedside. Dr. Ralene Bathe

## 2020-11-18 NOTE — ED Notes (Signed)
ED Provider at bedside. 

## 2020-11-18 NOTE — ED Notes (Signed)
X-ray at bedside

## 2020-11-23 NOTE — Progress Notes (Addendum)
[facility] [place], [place], [place] [address] [phone]  Date:  11/27/2020   Name:  Destiny Ballard   DOB:  Sep 19, 1950   MRN:  [id number]  PCP:  Darreld Mclean, MD    Chief Complaint: Follow-up (Urgent care follow up, one week ago, headache, nausea, numbness in left arm, normal visit at UC, given nitro)   History of Present Illness:  Destiny Ballard is a 70 y.o. very pleasant female patient who presents with the following:  Here today for a follow-up visit Last seen by myself in February - CAD, HTN, CHF, OSA, COPD, DM, GERD, HLD  She was in the ER on 5/7 with chest pain and left arm numbness- she was evaluated and released to home.   Her symptoms improved following 2 doses of nitroglycerin, she notes that the Nitro she has at home is expired.  I will give her a new prescription  She would like [facility]-her longtime provider Truitt Merle is retiring Numbness is now resolved -has not recurred  covid series Eye exam- she will do same, optho just contacted her  Diabetes has been under poor control - she is now seeing Dr Kelton Pillar, most recent visit 2/22  She is using Trulicity 1.5 mg weekly, this seems to be helping her quite a bit.  She has lost weight and her glucose is under better control  Her glucose today was 123  Lab Results  Component Value Date   HGBA1C 9.3 (H) 08/22/2020   Wt Readings from Last 3 Encounters:  11/27/20 177 lb (80.3 kg)  11/18/20 180 lb (81.6 kg)  09/05/20 178 lb 6 oz (80.9 kg)   However, Trulicity is expensive.  She is working on some patient assistance forms which we are glad to help her with  She still enjoys doing rehab for squirrels    Patient Active Problem List   Diagnosis Date Noted  . Pneumonia due to COVID-19 virus 08/21/2020  . Hyperlipidemia associated with type 2 diabetes mellitus ([place]) 08/21/2020  .  Uncontrolled type 2 diabetes mellitus with complication, with long-term current use of insulin ([place]) 03/14/2020  . Type 2 diabetes mellitus with hyperglycemia, with long-term current use of insulin ([place]) 12/10/2019  . Type 2 diabetes mellitus with diabetic polyneuropathy, with long-term current use of insulin ([place]) 12/10/2019  . Obesity (BMI 30-39.9) 08/24/2018  . Depression with anxiety 08/24/2018  . CAD (coronary artery disease) 08/24/2018  . Abnormal cardiovascular stress test 08/24/2018  . Lobar pneumonia, unspecified organism ([place]) 11/13/2017  . Precordial chest pain   . Sepsis ([place]) 09/18/2017  . Chronic diastolic CHF (congestive heart failure) ([place]) 09/18/2017  . Diabetes mellitus ([place]) 09/08/2017  . Left hip pain 10/28/2016  . Chronic rhinitis 08/07/2016  . COPD with acute exacerbation ([place]) 04/25/2016  . Hypertension associated with diabetes ([place]) 01/30/2016  . Hypokalemia 01/27/2016  . HLD (hyperlipidemia)   . Type 2 diabetes mellitus without complication, without long-term current use of insulin ([place])   . OSA (obstructive sleep apnea) 05/06/2014  . Dyspnea on exertion 09/02/2012  . Chest pain 12/10/2011  . Chronic cough 05/11/2010  . GERD 06/28/2008    Past Medical History:  Diagnosis Date  . Anxiety    Prior suicide attempt  . CAD (coronary artery disease)    a) s/p DES to LAD 07/2005 b) Last Myoview low risk 11/2011 showing small fixed apical  perfusion defect (prior MI vs attenuation) but no ischemia - normal EF.  [name] Cervical spondylosis   . Coronary atherosclerosis 06/28/2008  . Depression   . Depression with anxiety   . Diabetes mellitus without complication ([place])   . GERD (gastroesophageal reflux disease)   . Hyperlipidemia   . Hypertension   . Insulin resistance   . Iron deficiency anemia   . Obesity     Past Surgical History:  Procedure Laterality Date  . BREAST ENHANCEMENT SURGERY    . CARDIAC CATHETERIZATION  06/17/2007   NORMAL. EF 60%  . CARDIAC  CATHETERIZATION N/A 01/29/2016   Procedure: Left Heart Cath and Coronary Angiography;  Surgeon: Sherren Mocha, MD;  Location: [facility];  Service: Cardiovascular;  Laterality: N/A;  . CERVICAL SPONDYLOSIS     SINGLE LEVEL FUSION  . CHILDBIRTH     X3  . CORONARY STENT PLACEMENT  07/2005   LEFT ANTERIOR DESCENDING  . FOREARM FRACTURE SURGERY  2010   hand and shoulder   . INCISION AND DRAINAGE BREAST ABSCESS  01/05/2012      . INCISION AND DRAINAGE PERIRECTAL ABSCESS N/A 02/18/2014   Procedure: IRRIGATION AND DEBRIDEMENT PERIRECTAL ABSCESS;  Surgeon: Pedro Earls, MD;  Location: [facility];  Service: General;  Laterality: N/A;  . LEFT HEART CATH AND CORONARY ANGIOGRAPHY N/A 10/10/2017   Procedure: LEFT HEART CATH AND CORONARY ANGIOGRAPHY;  Surgeon: Burnell Blanks, MD;  Location: [facility];  Service: Cardiovascular;  Laterality: N/A;  . LUMBAR LAMINECTOMY    . ROTATOR CUFF REPAIR     bilaterla  . TONSILLECTOMY AND ADENOIDECTOMY    . TUBAL LIGATION    . VIDEO BRONCHOSCOPY Bilateral 08/13/2016   Procedure: VIDEO BRONCHOSCOPY WITHOUT FLUORO;  Surgeon: Collene Gobble, MD;  [facility];  Service: Cardiopulmonary;  Laterality: Bilateral;    Social History   Tobacco Use  . Smoking status: Passive Smoke Exposure - Never Smoker  . Smokeless tobacco: Never Used  Vaping Use  . Vaping Use: Never used  Substance Use Topics  . Alcohol use: Yes    Comment: occ  . Drug use: No    Family History  Problem Relation Age of Onset  . Heart attack Mother   . Diabetes Mother   . Lung cancer Mother   . Asthma Mother   . Heart disease Mother   . Suicidality Father        "killed himself"  . Asthma Daughter        x 2  . Cancer Daughter        pre-cancerous polyp  . Diabetes Sister   . Cancer Sister   . Cervical cancer Daughter        cervical   . Allergies Other        all family--seasonal allergies    Allergies  Allergen Reactions  . Prednisone Other  (See Comments)    REACTION: mood swings, nightmares. "Shot doesn't bother me, reaction is just with the pill" she states she has had the steroid injections before. From our records methylprednisone was given to her in 2013 without any complications.    Medication list has been reviewed and updated.  Current Outpatient Medications on File Prior to Visit  Medication Sig Dispense Refill  . albuterol (VENTOLIN HFA) 108 (90 Base) MCG/ACT inhaler INHALE 2 PUFFS INTO THE LUNGS EVERY EIGHT HOURS AS NEEDED FOR WHEEZING OR SHORTNESS OF BREATH. 18 g 0  . aspirin EC 81 MG tablet Take  81 mg by mouth daily.    . benzonatate (TESSALON) 200 MG capsule TAKE 1 CAPSULE (200 MG TOTAL) BY MOUTH THREE TIMES DAILY AS NEEDED FOR COUGH. 20 capsule 0  . calcium carbonate (TUMS EX) 750 MG chewable tablet Chew 1 tablet by mouth daily as needed for heartburn.    . Cholecalciferol (VITAMIN D3) 1000 UNITS CAPS Take 1,000 Units by mouth daily.    . dapagliflozin propanediol (FARXIGA) 10 MG TABS tablet Take 1 tablet (10 mg total) by mouth daily. 90 tablet 3  . Dulaglutide (TRULICITY) 1.5 0000000 SOPN Inject 1.5 mg into the skin once a week. 6 mL 2  . Fexofenadine HCl (ALLEGRA ALLERGY PO) Take 1 tablet by mouth daily.    [name] FLUoxetine (PROZAC) 40 MG capsule TAKE 1 CAPSULE BY MOUTH EVERY DAY (Patient taking differently: Take 40 mg by mouth daily.) 90 capsule 3  . fluticasone (FLONASE) 50 MCG/ACT nasal spray SPRAY 2 SPRAYS INTO EACH NOSTRIL EVERY DAY (Patient taking differently: Place 2 sprays into both nostrils daily as needed for allergies.) 48 g 0  . furosemide (LASIX) 20 MG tablet TAKE 1 TABLET BY MOUTH EVERY DAY (Patient taking differently: Take 20 mg by mouth daily.) 90 tablet 2  . gabapentin (NEURONTIN) 300 MG capsule Take 2 capsules (600 mg total) by mouth 2 (two) times daily. 360 capsule 1  . hydrALAZINE (APRESOLINE) 25 MG tablet Patient needs to schedule appointment for any future refills.  Please call [phone].  1st  attempt. (Patient taking differently: Take 25 mg by mouth 2 (two) times daily.) 336 tablet 0  . hydrOXYzine (ATARAX/VISTARIL) 25 MG tablet TAKE 1/2 TO 1 TABLET BY MOUTH EVERY 8 HOURS AS NEEDED FOR ITCHING (Patient taking differently: Take 12.5-25 mg by mouth every 8 (eight) hours as needed for itching.) 270 tablet 3  . Insulin Glargine (BASAGLAR KWIKPEN) 100 UNIT/ML Inject 30 Units into the skin daily. 30 mL 3  . Insulin Pen Needle 32G X 4 MM MISC 1 Device by Does not apply route daily. 100 each 3  . magnesium oxide (MAG-OX) 400 MG tablet Take 400 mg by mouth daily.    . metoprolol succinate (TOPROL-XL) 100 MG 24 hr tablet Take 1 tablet (100 mg total) by mouth daily. 90 tablet 3  . montelukast (SINGULAIR) 10 MG tablet TAKE 1 TABLET BY MOUTH EVERY DAY AS NEEDED FOR ALLERGIES (Patient taking differently: Take 10 mg by mouth at bedtime.) 90 tablet 1  . Potassium 99 MG TABS Take 1 tablet by mouth daily.    . rosuvastatin (CRESTOR) 20 MG tablet TAKE 1 TABLET BY MOUTH EVERY DAY (Patient taking differently: Take 20 mg by mouth every evening.) 90 tablet 3  . Tiotropium Bromide Monohydrate (SPIRIVA RESPIMAT) 2.5 MCG/ACT AERS Inhale 2 puffs into the lungs daily. 4 g 3  . traZODone (DESYREL) 50 MG tablet TAKE 1/2 TO 1 TABLET BY MOUTH AT BEDTIME AS NEEDED FOR SLEEP (Patient taking differently: Take 25-50 mg by mouth at bedtime as needed for sleep.) 90 tablet 1  . valsartan (DIOVAN) 160 MG tablet Take 1 tablet (160 mg total) by mouth daily. 90 tablet 3  . zinc gluconate 50 MG tablet Take 50 mg by mouth daily.     No current facility-administered medications on file prior to visit.    Review of Systems:  As per HPI- otherwise negative.   Physical Examination: Vitals:   11/27/20 1415  BP: 134/72  Pulse: 94  Resp: 18  SpO2: 97%   Vitals:  11/27/20 1415  Weight: 177 lb (80.3 kg)  Height: 4\' 11"  (1.499 m)   Body mass index is 35.75 kg/m. Ideal Body Weight: Weight in (lb) to have BMI = 25:  123.5  GEN: no acute distress.  Obese, looks well and her normal self HEENT: Atraumatic, Normocephalic.  Ears and Nose: No external deformity. CV: RRR, No M/G/R. No JVD. No thrill. No extra heart sounds. PULM: CTA B, no wheezes, crackles, rhonchi. No retractions. No resp. distress. No accessory muscle use. ABD: S, NT, ND, +BS. No rebound. No HSM. EXTR: No c/c/e PSYCH: Normally interactive. Conversant.    Assessment and Plan: Type 2 diabetes mellitus with hyperglycemia, with long-term current use of insulin ([place]) - Plan: Hemoglobin A1c  Coronary artery disease involving native heart with other form of angina pectoris, unspecified vessel or lesion type ([place]) - Plan: Ambulatory referral to Cardiology  Chest pain, unspecified type - Plan: nitroGLYCERIN (NITROSTAT) 0.4 MG SL tablet  Type 2 diabetes mellitus with other circulatory complication, with long-term current use of insulin ([place]) - Plan: metFORMIN (GLUCOPHAGE-XR) 500 MG 24 hr tablet  Hospital discharge follow-up  Here today following up from recent ER visit.  She was seen with chest pain and arm numbness, she was treated and released.  The symptoms are now resolved  She is using Trulicity with improvement of her blood sugars, will check A1c today- Will plan further follow- up pending labs.   She needs to establish with a new cardiologist, I have placed a referral for her  Refilled metformin, gave prescription for new bottle of nitro  This visit occurred during the SARS-CoV-2 public health emergency.  Safety protocols were in place, including screening questions prior to the visit, additional usage of staff PPE, and extensive cleaning of exam room while observing appropriate contact time as indicated for disinfecting solutions.    Signed Lamar Blinks, MD   Received her labs 5/17- message to pt  Results for orders placed or performed in visit on 11/27/20  Hemoglobin A1c  Result Value Ref Range   Hgb A1c MFr Bld 8.3 (H)  4.6 - 6.5 %

## 2020-11-23 NOTE — Patient Instructions (Addendum)
It was good to see you again today!   I will be in touch with your A1c We are glad to fill out your patient assistance forms for the [place] if you like  We refilled your nitro and metformin today as well  Please see me in about 4 months   Please get your covid vaccine if not done already!

## 2020-11-27 ENCOUNTER — Other Ambulatory Visit: Payer: Self-pay

## 2020-11-27 ENCOUNTER — Ambulatory Visit (INDEPENDENT_AMBULATORY_CARE_PROVIDER_SITE_OTHER): Payer: Medicare Other | Admitting: Family Medicine

## 2020-11-27 ENCOUNTER — Encounter: Payer: Self-pay | Admitting: Family Medicine

## 2020-11-27 VITALS — BP 134/72 | HR 94 | Resp 18 | Ht 59.0 in | Wt 177.0 lb

## 2020-11-27 DIAGNOSIS — R079 Chest pain, unspecified: Secondary | ICD-10-CM | POA: Diagnosis not present

## 2020-11-27 DIAGNOSIS — E1159 Type 2 diabetes mellitus with other circulatory complications: Secondary | ICD-10-CM

## 2020-11-27 DIAGNOSIS — I25118 Atherosclerotic heart disease of native coronary artery with other forms of angina pectoris: Secondary | ICD-10-CM

## 2020-11-27 DIAGNOSIS — Z09 Encounter for follow-up examination after completed treatment for conditions other than malignant neoplasm: Secondary | ICD-10-CM | POA: Diagnosis not present

## 2020-11-27 DIAGNOSIS — E1165 Type 2 diabetes mellitus with hyperglycemia: Secondary | ICD-10-CM

## 2020-11-27 DIAGNOSIS — Z794 Long term (current) use of insulin: Secondary | ICD-10-CM

## 2020-11-27 MED ORDER — METFORMIN HCL ER 500 MG PO TB24: 1000.0000 mg | ORAL_TABLET | Freq: Two times a day (BID) | ORAL | 3 refills | Status: DC

## 2020-11-27 MED ORDER — NITROGLYCERIN 0.4 MG SL SUBL: 0.4000 mg | SUBLINGUAL_TABLET | SUBLINGUAL | 3 refills | Status: DC | PRN

## 2020-11-28 ENCOUNTER — Encounter: Payer: Self-pay | Admitting: Family Medicine

## 2020-11-28 LAB — HEMOGLOBIN A1C: Hgb A1c MFr Bld: 8.3 % — ABNORMAL HIGH (ref 4.6–6.5)

## 2020-12-08 ENCOUNTER — Other Ambulatory Visit: Payer: Self-pay | Admitting: Family Medicine

## 2020-12-08 DIAGNOSIS — J302 Other seasonal allergic rhinitis: Secondary | ICD-10-CM

## 2020-12-18 DIAGNOSIS — E785 Hyperlipidemia, unspecified: Secondary | ICD-10-CM | POA: Insufficient documentation

## 2020-12-18 DIAGNOSIS — F419 Anxiety disorder, unspecified: Secondary | ICD-10-CM | POA: Insufficient documentation

## 2020-12-18 DIAGNOSIS — E669 Obesity, unspecified: Secondary | ICD-10-CM | POA: Insufficient documentation

## 2020-12-18 DIAGNOSIS — K921 Melena: Secondary | ICD-10-CM | POA: Insufficient documentation

## 2020-12-18 DIAGNOSIS — I10 Essential (primary) hypertension: Secondary | ICD-10-CM | POA: Insufficient documentation

## 2020-12-18 DIAGNOSIS — F32A Depression, unspecified: Secondary | ICD-10-CM | POA: Insufficient documentation

## 2020-12-18 DIAGNOSIS — K5900 Constipation, unspecified: Secondary | ICD-10-CM

## 2020-12-18 DIAGNOSIS — Z860100 Personal history of colon polyps, unspecified: Secondary | ICD-10-CM | POA: Insufficient documentation

## 2020-12-18 DIAGNOSIS — E8881 Metabolic syndrome: Secondary | ICD-10-CM | POA: Insufficient documentation

## 2020-12-18 DIAGNOSIS — D509 Iron deficiency anemia, unspecified: Secondary | ICD-10-CM | POA: Insufficient documentation

## 2020-12-18 DIAGNOSIS — E88819 Insulin resistance, unspecified: Secondary | ICD-10-CM | POA: Insufficient documentation

## 2020-12-18 DIAGNOSIS — K219 Gastro-esophageal reflux disease without esophagitis: Secondary | ICD-10-CM | POA: Insufficient documentation

## 2020-12-18 DIAGNOSIS — M47812 Spondylosis without myelopathy or radiculopathy, cervical region: Secondary | ICD-10-CM | POA: Insufficient documentation

## 2020-12-18 DIAGNOSIS — E119 Type 2 diabetes mellitus without complications: Secondary | ICD-10-CM | POA: Insufficient documentation

## 2020-12-18 HISTORY — DX: Personal history of colon polyps, unspecified: Z86.0100

## 2020-12-18 HISTORY — DX: Melena: K92.1

## 2020-12-18 HISTORY — DX: Personal history of colonic polyps: Z86.010

## 2020-12-18 HISTORY — DX: Constipation, unspecified: K59.00

## 2021-01-01 ENCOUNTER — Ambulatory Visit (INDEPENDENT_AMBULATORY_CARE_PROVIDER_SITE_OTHER): Payer: Medicare Other | Admitting: Cardiology

## 2021-01-01 ENCOUNTER — Encounter: Payer: Self-pay | Admitting: Cardiology

## 2021-01-01 ENCOUNTER — Other Ambulatory Visit: Payer: Self-pay

## 2021-01-01 VITALS — BP 120/60 | HR 82 | Ht 59.0 in | Wt 178.0 lb

## 2021-01-01 DIAGNOSIS — E1159 Type 2 diabetes mellitus with other circulatory complications: Secondary | ICD-10-CM | POA: Diagnosis not present

## 2021-01-01 DIAGNOSIS — R079 Chest pain, unspecified: Secondary | ICD-10-CM

## 2021-01-01 DIAGNOSIS — R072 Precordial pain: Secondary | ICD-10-CM | POA: Diagnosis not present

## 2021-01-01 DIAGNOSIS — I152 Hypertension secondary to endocrine disorders: Secondary | ICD-10-CM

## 2021-01-01 NOTE — Progress Notes (Signed)
Cardiology Office Note:    Date:  01/01/2021   ID:  Destiny Ballard, DOB 1950/11/03, MRN [id number]  PCP:  Darreld Mclean, MD  Cardiologist:  Jenne Campus, MD    Referring MD: Darreld Mclean, MD   Chief Complaint  Patient presents with   Establish Care  Had a chest pain  History of Present Illness:    Destiny Ballard is a 70 y.o. female she is a super sweet lady with past medical history significant for essential hypertension, hyperlipidemia, diabetes mellitus, GERD, anxiety, morbid obesity, coronary artery disease.  In 2007 she did have PTCA and stenting done and then last evaluation of coronary artery was done in 2019 when she had cardiac catheterization in March showing 20% proximal RCA, 20% distal RCA second obtuse marginal branch 40% stenosis, proximal circumflex 20% stenosis 10% ostial LAD and proximal LAD, mid LAD stent was patent with minimal restenosis. She was lost from follow-up for 2 years and then she would like to be establish as a patient.  She ended up being in the emergency room few weeks ago because of atypical chest pain she ruled out for myocardial infarction but she was referred back to [place].  Now she tells me she is doing for she lost her husband 4 years ago since that time she is kind of sad.  She lives with her daughter she is trying to be active but does not do much activities.  She does have small dog to our that she is taking care of.  She also like to take care of young squirrels.  Past Medical History:  Diagnosis Date   Anxiety    Prior suicide attempt   CAD (coronary artery disease)    a) s/p DES to LAD 07/2005 b) Last Myoview low risk 11/2011 showing small fixed apical perfusion defect (prior MI vs attenuation) but no ischemia - normal EF.   Cervical spondylosis    Chest pain 12/10/2011   Chronic diastolic CHF (congestive heart failure) ([facility]) 09/18/2017   Chronic eczematous otitis externa of both ears 11/18/2019   Chronic rhinitis 08/07/2016    Constipation 12/18/2020   COPD with acute exacerbation ([place]) 04/25/2016   Coronary atherosclerosis 06/28/2008   Depression    Depression with anxiety    Diabetes mellitus ([place]) 09/08/2017   Diabetes mellitus without complication ([place])    Dyspnea on exertion 09/02/2012   CXR 07/2012:  No acute process.     Eustachian tube dysfunction, bilateral 11/18/2019   GERD 06/28/2008   GERD (gastroesophageal reflux disease)    History of colonic polyps 12/18/2020   HLD (hyperlipidemia)    Hyperlipidemia    Hyperlipidemia associated with type 2 diabetes mellitus ([place]) 08/21/2020   Hypertension    Hypertension associated with diabetes ([place]) 01/30/2016   Hypokalemia 01/27/2016   Insulin resistance    Iron deficiency anemia    Lobar pneumonia, unspecified organism ([place]) 11/13/2017   Melena 12/18/2020   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   Obesity    Obesity (BMI 30-39.9) 08/24/2018   Last Assessment & Plan:  Exercise and weight reduction is encouraged.   OSA (obstructive sleep apnea) 05/06/2014   Pneumonia due to COVID-19 virus 08/21/2020   Precordial chest pain    Sepsis ([place]) 09/18/2017   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   Tinnitus of both ears 11/18/2019   Type 2 diabetes mellitus with diabetic polyneuropathy, with long-term current use of insulin ([place]) 12/10/2019   Type 2 diabetes mellitus  with hyperglycemia, with long-term current use of insulin ([place]) 12/10/2019   Uncontrolled type 2 diabetes mellitus with complication, with long-term current use of insulin ([place]) 03/14/2020    Past Surgical History:  Procedure Laterality Date   BREAST ENHANCEMENT SURGERY     CARDIAC CATHETERIZATION  06/17/2007   NORMAL. EF 60%   CARDIAC CATHETERIZATION N/A 01/29/2016   Procedure: Left Heart Cath and Coronary Angiography;  Surgeon: Sherren Mocha, MD;  Location: [facility];  Service: Cardiovascular;  Laterality: N/A;   CERVICAL SPONDYLOSIS     SINGLE LEVEL FUSION    CHILDBIRTH     X3   CORONARY STENT PLACEMENT  07/2005   LEFT ANTERIOR DESCENDING   FOREARM FRACTURE SURGERY  2010   hand and shoulder    INCISION AND DRAINAGE BREAST ABSCESS  01/05/2012       INCISION AND DRAINAGE PERIRECTAL ABSCESS N/A 02/18/2014   Procedure: IRRIGATION AND DEBRIDEMENT PERIRECTAL ABSCESS;  Surgeon: Pedro Earls, MD;  [facility];  Service: General;  Laterality: N/A;   LEFT HEART CATH AND CORONARY ANGIOGRAPHY N/A 10/10/2017   Procedure: LEFT HEART CATH AND CORONARY ANGIOGRAPHY;  Surgeon: Burnell Blanks, MD;  Location: [facility];  Service: Cardiovascular;  Laterality: N/A;   LUMBAR LAMINECTOMY     ROTATOR CUFF REPAIR     bilaterla   TONSILLECTOMY AND ADENOIDECTOMY     TUBAL LIGATION     VIDEO BRONCHOSCOPY Bilateral 08/13/2016   Procedure: VIDEO BRONCHOSCOPY WITHOUT FLUORO;  Surgeon: Collene Gobble, MD;  [facility];  Service: Cardiopulmonary;  Laterality: Bilateral;    Current Medications: Current Meds  Medication Sig   albuterol (VENTOLIN HFA) 108 (90 Base) MCG/ACT inhaler INHALE 2 PUFFS INTO THE LUNGS EVERY EIGHT HOURS AS NEEDED FOR WHEEZING OR SHORTNESS OF BREATH.   aspirin EC 81 MG tablet Take 81 mg by mouth daily.   calcium carbonate (TUMS EX) 750 MG chewable tablet Chew 1 tablet by mouth daily as needed for heartburn.   Cholecalciferol (VITAMIN D3) 1000 UNITS CAPS Take 1,000 Units by mouth daily.   dapagliflozin propanediol (FARXIGA) 10 MG TABS tablet Take 1 tablet (10 mg total) by mouth daily.   Dulaglutide (TRULICITY) 1.5 ZO/1.0RU SOPN Inject 1.5 mg into the skin once a week.   Fexofenadine HCl (ALLEGRA ALLERGY PO) Take 1 tablet by mouth daily.   FLUoxetine (PROZAC) 40 MG capsule TAKE 1 CAPSULE BY MOUTH EVERY DAY   fluticasone (FLONASE) 50 MCG/ACT nasal spray Place 2 sprays into both nostrils as needed for allergies or rhinitis.   furosemide (LASIX) 20 MG tablet TAKE 1 TABLET BY MOUTH EVERY DAY   gabapentin (NEURONTIN) 300 MG  capsule Take 2 capsules (600 mg total) by mouth 2 (two) times daily.   hydrALAZINE (APRESOLINE) 25 MG tablet Take 25 mg by mouth 2 (two) times daily.   hydrOXYzine (ATARAX/VISTARIL) 25 MG tablet TAKE 1/2 TO 1 TABLET BY MOUTH EVERY 8 HOURS AS NEEDED FOR ITCHING   Insulin Glargine (BASAGLAR KWIKPEN) 100 UNIT/ML Inject 30 Units into the skin daily.   Insulin Pen Needle 32G X 4 MM MISC 1 Device by Does not apply route daily.   magnesium oxide (MAG-OX) 400 MG tablet Take 400 mg by mouth daily.   metFORMIN (GLUCOPHAGE-XR) 500 MG 24 hr tablet Take 2 tablets (1,000 mg total) by mouth 2 (two) times daily.   metoprolol succinate (TOPROL-XL) 100 MG 24 hr tablet Take 1 tablet (100 mg total) by mouth daily.   montelukast (SINGULAIR)  10 MG tablet Take 1 tablet (10 mg total) by mouth at bedtime.   nitroGLYCERIN (NITROSTAT) 0.4 MG SL tablet Place 1 tablet (0.4 mg total) under the tongue every 5 (five) minutes as needed for chest pain.   Potassium 99 MG TABS Take 1 tablet by mouth daily.   rosuvastatin (CRESTOR) 20 MG tablet TAKE 1 TABLET BY MOUTH EVERY DAY   Tiotropium Bromide Monohydrate (SPIRIVA RESPIMAT) 2.5 MCG/ACT AERS Inhale 2 puffs into the lungs daily.   traZODone (DESYREL) 50 MG tablet TAKE 1/2 TO 1 TABLET BY MOUTH AT BEDTIME AS NEEDED FOR SLEEP   valsartan (DIOVAN) 160 MG tablet Take 1 tablet (160 mg total) by mouth daily.   zinc gluconate 50 MG tablet Take 50 mg by mouth daily.     Allergies:   Prednisone   Social History   Socioeconomic History   Marital status: Widowed    Spouse name: Lake Bells   Number of children: 3   Years of education: 12   Highest education level: Not on file  Occupational History   Occupation: retired from [employer]: 11/2012  Tobacco Use   Smoking status: Never    Passive exposure: Yes   Smokeless tobacco: Never  Vaping Use   Vaping Use: Never used  Substance and Sexual Activity   Alcohol use: Yes    Comment: occ   Drug use: No   Sexual activity: Not  Currently    Partners: Male    Birth control/protection: None  Other Topics Concern   Not on file  Social History Narrative   Their eldest daughter lives upstairs.   Social Determinants of Health   Financial Resource Strain: Medium Risk   Difficulty of Paying Living Expenses: Somewhat hard  Food Insecurity: No Food Insecurity   Worried About Charity fundraiser in the Last Year: Never true   Ran Out of Food in the Last Year: Never true  Transportation Needs: No Transportation Needs   Lack of Transportation (Medical): No   Lack of Transportation (Non-Medical): No  Physical Activity: Not on file  Stress: Not on file  Social Connections: Not on file     Family History: The patient's family history includes Allergies in an other family member; Asthma in her daughter and mother; Cancer in her daughter and sister; Cervical cancer in her daughter; Diabetes in her mother and sister; Heart attack in her mother; Heart disease in her mother; Lung cancer in her mother; Suicidality in her father. ROS:   Please see the history of present illness.    All 14 point review of systems negative except as described per history of present illness  EKGs/Labs/Other Studies Reviewed:      Recent Labs: 03/18/2020: B Natriuretic Peptide 45.7 08/23/2020: Magnesium 2.2 11/18/2020: ALT 20; BUN 10; Creatinine, Ser 0.61; Hemoglobin 12.6; Platelets 287; Potassium 3.8; Sodium 137  Recent Lipid Panel    Component Value Date/Time   CHOL 145 09/09/2019 1128   CHOL 200 (H) 05/19/2018 0939   TRIG 366 (H) 08/21/2020 1653   HDL 42.60 09/09/2019 1128   HDL 37 (L) 05/19/2018 0939   CHOLHDL 3 09/09/2019 1128   VLDL 72.6 (H) 09/09/2019 1128   LDLCALC Comment 05/19/2018 0939   LDLDIRECT 64.0 09/09/2019 1128    Physical Exam:    VS:  BP 120/60 (BP Location: Right Arm, Patient Position: Sitting, Cuff Size: Normal)   Pulse 82   Ht 4\' 11"  (1.499 m)   Wt 178 lb (80.7 kg)  SpO2 93%   BMI 35.95 kg/m     Wt  Readings from Last 3 Encounters:  01/01/21 178 lb (80.7 kg)  11/27/20 177 lb (80.3 kg)  11/18/20 180 lb (81.6 kg)     GEN:  Well nourished, well developed in no acute distress HEENT: Normal NECK: No JVD; No carotid bruits LYMPHATICS: No lymphadenopathy CARDIAC: RRR, systolic murmur 1-2 over 6 best heard right upper portion of the sternum no rubs, no gallops RESPIRATORY:  Clear to auscultation without rales, wheezing or rhonchi  ABDOMEN: Soft, non-tender, non-distended MUSCULOSKELETAL:  No edema; No deformity  SKIN: Warm and dry LOWER EXTREMITIES: no swelling NEUROLOGIC:  Alert and oriented x 3 PSYCHIATRIC:  Normal affect   ASSESSMENT:    1. Precordial chest pain   2. Hypertension associated with diabetes ([place])    PLAN:    In order of problems listed above:  Coronary artery disease status post PTCA and stenting of mid LAD in 2007.  Last cardiac catheterization 2019 showed nonobstructive lesions.  I will schedule her to have Lexiscan make sure that she got no inducible ischemia.  In the meantime we will continue antiplatelet therapy as well as statin. Dyslipidemia she is on statin which I will continue.  I see last cholesterol from February 2021.  We will schedule him to have fasting lipid profile done Essential hypertension: Blood pressure seems to well controlled continue present management. Diabetes not well controlled.  Her hemoglobin A1c from May of this year was 8.3.  I told her that this is important to take better of her diabetes.  She tells me that she is on Trulicity right now her sugar is much better. I will schedule her to have Lexiscan to rule out inducible ischemia, also echocardiogram will be done to assess heart murmur I heard on physical examination.   Medication Adjustments/Labs and Tests Ordered: Current medicines are reviewed at length with the patient today.  Concerns regarding medicines are outlined above.  No orders of the defined types were placed in this  encounter.  Medication changes: No orders of the defined types were placed in this encounter.   Signed, Park Liter, MD, [facility] 01/01/2021 11:27 AM    [place]

## 2021-01-01 NOTE — Patient Instructions (Signed)
Medication Instructions:  Your physician recommends that you continue on your current medications as directed. Please refer to the Current Medication list given to you today.  *If you need a refill on your cardiac medications before your next appointment, please call your pharmacy*   Lab Work: Your physician recommends that you return for lab work :lipid  If you have labs (blood work) drawn today and your tests are completely normal, you will receive your results only by: [place] (if you have MyChart) OR A paper copy in the mail If you have any lab test that is abnormal or we need to change your treatment, we will call you to review the results.   Testing/Procedures: Your physician has requested that you have an echocardiogram. Echocardiography is a painless test that uses sound waves to create images of your heart. It provides your doctor with information about the size and shape of your heart and how well your heart's chambers and valves are working. This procedure takes approximately one hour. There are no restrictions for this procedure.    [facility] at [facility] [address] [place], [place] [address] Phone: [phone]    Please arrive 15 minutes prior to your appointment time for registration and insurance purposes.  The test will take approximately 3 to 4 hours to complete; you may bring reading material.  If someone comes with you to your appointment, they will need to remain in the main lobby due to limited space in the testing area. **If you are pregnant or breastfeeding, please notify the nuclear lab prior to your appointment**  How to prepare for your Myocardial Perfusion Test: Do not eat or drink 3 hours prior to your test, except you may have water. Do not consume products containing caffeine (regular or decaffeinated) 12 hours prior to your test. (ex: coffee, chocolate, sodas, tea). Do bring a list of your current  medications with you.  If not listed below, you may take your medications as normal.  Do wear comfortable clothes (no dresses or overalls) and walking shoes, tennis shoes preferred (No heels or open toe shoes are allowed). Do NOT wear cologne, perfume, aftershave, or lotions (deodorant is allowed). If these instructions are not followed, your test will have to be rescheduled.  Please report to [address] for your test.  If you have questions or concerns about your appointment, you can call the Nuclear Lab at [phone].  If you cannot keep your appointment, please provide 24 hours notification to the Nuclear Lab, to avoid a possible $50 charge to your account.    Follow-Up: At [facility], you and your health needs are our priority.  As part of our continuing mission to provide you with exceptional heart care, we have created designated Provider Care Teams.  These Care Teams include your primary Cardiologist (physician) and Advanced Practice Providers (APPs -  Physician Assistants and Nurse Practitioners) who all work together to provide you with the care you need, when you need it.  We recommend signing up for the patient portal called "MyChart".  Sign up information is provided on this After Visit Summary.  MyChart is used to connect with patients for Virtual Visits (Telemedicine).  Patients are able to view lab/test results, encounter notes, upcoming appointments, etc.  Non-urgent messages can be sent to your provider as well.   To learn more about what you can do with MyChart, go to [email or web address].    Your next appointment:  5 month(s)  The format for your next appointment:   In Person  Provider:   Jenne Campus, MD   Other Instructions  Echocardiogram An echocardiogram is a test that uses sound waves (ultrasound) to produce images of the heart. Images from an echocardiogram can provide important information about: Heart size and shape. The  size and thickness and movement of your heart's walls. Heart muscle function and strength. Heart valve function or if you have stenosis. Stenosis is when the heart valves are too narrow. If blood is flowing backward through the heart valves (regurgitation). A tumor or infectious growth around the heart valves. Areas of heart muscle that are not working well because of poor blood flow or injury from a heart attack. Aneurysm detection. An aneurysm is a weak or damaged part of an artery wall. The wall bulges out from the normal force of blood pumping through the body. Tell a health care provider about: Any allergies you have. All medicines you are taking, including vitamins, herbs, eye drops, creams, and over-the-counter medicines. Any blood disorders you have. Any surgeries you have had. Any medical conditions you have. Whether you are pregnant or may be pregnant. What are the risks? Generally, this is a safe test. However, problems may occur, including an allergic reaction to dye (contrast) that may be used during the test. What happens before the test? No specific preparation is needed. You may eat and drink normally. What happens during the test?  You will take off your clothes from the waist up and put on a hospital gown. Electrodes or electrocardiogram (ECG)patches may be placed on your chest. The electrodes or patches are then connected to a device that monitors your heart rate and rhythm. You will lie down on a table for an ultrasound exam. A gel will be applied to your chest to help sound waves pass through your skin. A handheld device, called a transducer, will be pressed against your chest and moved over your heart. The transducer produces sound waves that travel to your heart and bounce back (or "echo" back) to the transducer. These sound waves will be captured in real-time and changed into images of your heart that can be viewed on a video monitor. The images will be recorded on a  computer and reviewed by your health care provider. You may be asked to change positions or hold your breath for a short time. This makes it easier to get different views or better views of your heart. In some cases, you may receive contrast through an IV in one of your veins. This can improve the quality of the pictures from your heart. The procedure may vary among health care providers and hospitals. What can I expect after the test? You may return to your normal, everyday life, including diet, activities, andmedicines, unless your health care provider tells you not to do that. Follow these instructions at home: It is up to you to get the results of your test. Ask your health care provider, or the department that is doing the test, when your results will be ready. Keep all follow-up visits. This is important. Summary An echocardiogram is a test that uses sound waves (ultrasound) to produce images of the heart. Images from an echocardiogram can provide important information about the size and shape of your heart, heart muscle function, heart valve function, and other possible heart problems. You do not need to do anything to prepare before this test. You may eat and drink normally. After the echocardiogram  is completed, you may return to your normal, everyday life, unless your health care provider tells you not to do that. This information is not intended to replace advice given to you by your health care provider. Make sure you discuss any questions you have with your healthcare provider. Document Revised: 02/22/2020 Document Reviewed: 02/22/2020 Elsevier Patient Education  2022 [place].   Cardiac Nuclear Scan A cardiac nuclear scan is a test that measures blood flow to the heart when a person is resting and when he or she is exercising. The test looks for problems such as: Not enough blood reaching a portion of the heart. The heart muscle not working normally. You may need this test  if: You have heart disease. You have had abnormal lab results. You have had heart surgery or a balloon procedure to open up blocked arteries (angioplasty). You have chest pain. You have shortness of breath. In this test, a radioactive dye (tracer) is injected into your bloodstream. After the tracer has traveled to your heart, an imaging device is used to measure how much of the tracer is absorbed by or distributed to various areas of your heart. This procedure is usuallydone at a hospital and takes 2-4 hours. Tell a health care provider about: Any allergies you have. All medicines you are taking, including vitamins, herbs, eye drops, creams, and over-the-counter medicines. Any problems you or family members have had with anesthetic medicines. Any blood disorders you have. Any surgeries you have had. Any medical conditions you have. Whether you are pregnant or may be pregnant. What are the risks? Generally, this is a safe procedure. However, problems may occur, including: Serious chest pain and heart attack. This is only a risk if the stress portion of the test is done. Rapid heartbeat. Sensation of warmth in your chest. This usually passes quickly. Allergic reaction to the tracer. What happens before the procedure? Ask your health care provider about changing or stopping your regular medicines. This is especially important if you are taking diabetes medicines or blood thinners. Follow instructions from your health care provider about eating or drinking restrictions. Remove your jewelry on the day of the procedure. What happens during the procedure? An IV will be inserted into one of your veins. Your health care provider will inject a small amount of radioactive tracer through the IV. You will wait for 20-40 minutes while the tracer travels through your bloodstream. Your heart activity will be monitored with an electrocardiogram (ECG). You will lie down on an exam table. Images of your  heart will be taken for about 15-20 minutes. You may also have a stress test. For this test, one of the following may be done: You will exercise on a treadmill or stationary bike. While you exercise, your heart's activity will be monitored with an ECG, and your blood pressure will be checked. You will be given medicines that will increase blood flow to parts of your heart. This is done if you are unable to exercise. When blood flow to your heart has peaked, a tracer will again be injected through the IV. After 20-40 minutes, you will get back on the exam table and have more images taken of your heart. Depending on the type of tracer used, scans may need to be repeated 3-4 hours later. Your IV line will be removed when the procedure is over. The procedure may vary among health care providers and hospitals. What happens after the procedure? Unless your health care provider tells you otherwise, you  may return to your normal schedule, including diet, activities, and medicines. Unless your health care provider tells you otherwise, you may increase your fluid intake. This will help to flush the contrast dye from your body. Drink enough fluid to keep your urine pale yellow. Ask your health care provider, or the department that is doing the test: When will my results be ready? How will I get my results? Summary A cardiac nuclear scan measures the blood flow to the heart when a person is resting and when he or she is exercising. Tell your health care provider if you are pregnant. Before the procedure, ask your health care provider about changing or stopping your regular medicines. This is especially important if you are taking diabetes medicines or blood thinners. After the procedure, unless your health care provider tells you otherwise, increase your fluid intake. This will help flush the contrast dye from your body. After the procedure, unless your health care provider tells you otherwise, you may  return to your normal schedule, including diet, activities, and medicines. This information is not intended to replace advice given to you by your health care provider. Make sure you discuss any questions you have with your healthcare provider. Document Revised: 12/15/2017 Document Reviewed: 12/15/2017 Elsevier Patient Education  [place].

## 2021-01-02 LAB — LIPID PANEL
Chol/HDL Ratio: 3 ratio (ref 0.0–4.4)
Cholesterol, Total: 130 mg/dL (ref 100–199)
HDL: 44 mg/dL (ref >39)
LDL Chol Calc (NIH): 45 mg/dL (ref 0–99)
Triglycerides: 266 mg/dL — ABNORMAL HIGH (ref 0–149)
VLDL Cholesterol Cal: 41 mg/dL — ABNORMAL HIGH (ref 5–40)

## 2021-01-03 ENCOUNTER — Ambulatory Visit: Payer: Medicare Other | Admitting: Family Medicine

## 2021-01-03 ENCOUNTER — Telehealth: Payer: Self-pay | Admitting: Cardiology

## 2021-01-03 NOTE — Telephone Encounter (Signed)
Patient is returning call to discuss lab results. 

## 2021-01-03 NOTE — Telephone Encounter (Signed)
Called patient back informed her of results.

## 2021-01-04 ENCOUNTER — Encounter (HOSPITAL_BASED_OUTPATIENT_CLINIC_OR_DEPARTMENT_OTHER): Payer: Self-pay | Admitting: Family Medicine

## 2021-01-09 ENCOUNTER — Other Ambulatory Visit: Payer: Self-pay

## 2021-01-09 ENCOUNTER — Ambulatory Visit (INDEPENDENT_AMBULATORY_CARE_PROVIDER_SITE_OTHER): Payer: Medicare Other | Admitting: Internal Medicine

## 2021-01-09 ENCOUNTER — Encounter: Payer: Self-pay | Admitting: Internal Medicine

## 2021-01-09 VITALS — BP 132/88 | HR 75 | Ht <= 58 in | Wt 176.0 lb

## 2021-01-09 DIAGNOSIS — E1159 Type 2 diabetes mellitus with other circulatory complications: Secondary | ICD-10-CM

## 2021-01-09 DIAGNOSIS — Z794 Long term (current) use of insulin: Secondary | ICD-10-CM

## 2021-01-09 DIAGNOSIS — E1142 Type 2 diabetes mellitus with diabetic polyneuropathy: Secondary | ICD-10-CM | POA: Diagnosis not present

## 2021-01-09 NOTE — Progress Notes (Signed)
Name: Destiny Ballard  Age/ Sex: 70 y.o., female   MRN/ DOB: [id number], [birth year]     PCP: Darreld Mclean, MD   Reason for Endocrinology Evaluation: Type 2 Diabetes Mellitus  Initial Endocrine Consultative Visit: 12/10/2019    PATIENT IDENTIFIER: Destiny Ballard is a 70 y.o. female with a past medical history of T2Dm, dyslipidemia, CAD , OSA and HTN . The patient has followed with Endocrinology clinic since 12/10/2019 for consultative assistance with management of her diabetes.  DIABETIC HISTORY:  Destiny Ballard was diagnosed with T2DM many years ago.On her initial visit she was on oral glycemic agents and insulin. Does not recall prior meds.  Her hemoglobin A1c has ranged from 6.2% in 2013, peaking at 10.1% in 2020    On her initial visit to our clinic she had an A1c of 7.8%, she was on metformin, Farxiga, Glipizide and Levemir. We stopped Glipizide, Levemir and started basaglar, continued Metformin and farxiga   Started trulicity 09/3543 SUBJECTIVE:   During the last visit (09/05/2020): A1c 9.3 %. We increased basaglar and Trulicity and Basaglar and continued Metformin and [place].     Today (01/09/2021): Destiny Ballard is here for a follow up on diabetes management. She checks her blood sugars 3 times daily.The patient has not had hypoglycemic episodes since the last clinic visit.   Denies nausea or diarrhea   Tingling of  both feet bothers her at night   HOME DIABETES REGIMEN:  Metformin 500 mg , 2 tablet twice daily  Farxiga 10 mg , 1 tablet daily in the morning  Basaglar 30 units daily  Trulicity 1.5 mg weekly   Statin: Yes ACE-I/ARB: Yes   METER DOWNLOAD SUMMARY: Unable to download  110- 341 mg/dL   DIABETIC COMPLICATIONS: Microvascular complications:  Neuropathy  Denies: CKD, retinopathy  Last eye exam: has an eye exam for 02/2020   Macrovascular complications:  CAD (S/P stent placement)  Denies: PVD, CVA   HISTORY:  Past Medical History:  Past  Medical History:  Diagnosis Date   Anxiety    Prior suicide attempt   CAD (coronary artery disease)    a) s/p DES to LAD 07/2005 b) Last Myoview low risk 11/2011 showing small fixed apical perfusion defect (prior MI vs attenuation) but no ischemia - normal EF.   Cervical spondylosis    Chest pain 12/10/2011   Chronic diastolic CHF (congestive heart failure) (HCC) 09/18/2017   Chronic eczematous otitis externa of both ears 11/18/2019   Chronic rhinitis 08/07/2016   Constipation 12/18/2020   COPD with acute exacerbation ([place]) 04/25/2016   Coronary atherosclerosis 06/28/2008   Depression    Depression with anxiety    Diabetes mellitus ([place]) 09/08/2017   Diabetes mellitus without complication ([place])    Dyspnea on exertion 09/02/2012   CXR 07/2012:  No acute process.     Eustachian tube dysfunction, bilateral 11/18/2019   GERD 06/28/2008   GERD (gastroesophageal reflux disease)    History of colonic polyps 12/18/2020   HLD (hyperlipidemia)    Hyperlipidemia    Hyperlipidemia associated with type 2 diabetes mellitus (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 EF 60%   CARDIAC CATHETERIZATION N/A 01/29/2016   Procedure: Left Heart Cath and Coronary Angiography;  Surgeon: Sherren Mocha, MD;  Location: [facility];  Service: Cardiovascular;  Laterality: N/A;   CERVICAL SPONDYLOSIS     SINGLE LEVEL FUSION   CHILDBIRTH     X3   CORONARY STENT PLACEMENT  07/2005   LEFT ANTERIOR DESCENDING   FOREARM FRACTURE SURGERY  2010   hand and shoulder    INCISION AND DRAINAGE BREAST ABSCESS  01/05/2012       INCISION AND DRAINAGE PERIRECTAL ABSCESS N/A 02/18/2014   Procedure: IRRIGATION AND DEBRIDEMENT PERIRECTAL ABSCESS;  Surgeon: Pedro Earls, MD;  Location: [facility];  Service: General;  Laterality: N/A;   LEFT HEART CATH AND CORONARY ANGIOGRAPHY N/A 10/10/2017   Procedure: LEFT HEART CATH AND CORONARY ANGIOGRAPHY;  Surgeon: Burnell Blanks, MD;  Location: [facility];  Service: Cardiovascular;  Laterality: N/A;   LUMBAR LAMINECTOMY     ROTATOR CUFF REPAIR     bilaterla   TONSILLECTOMY AND ADENOIDECTOMY     TUBAL LIGATION     VIDEO BRONCHOSCOPY Bilateral 08/13/2016   Procedure: VIDEO BRONCHOSCOPY WITHOUT FLUORO;  Surgeon: Collene Gobble, MD;  Location: [facility];  Service: Cardiopulmonary;  Laterality: Bilateral;   Social History:  reports that she has never smoked. She has been exposed to tobacco smoke. She has never used smokeless tobacco. She reports current alcohol use. She reports that she does not use drugs. Family History:  Family History  Problem Relation Age of Onset   Heart attack Mother    Diabetes Mother    Lung cancer Mother    Asthma Mother    Heart disease Mother    Suicidality Father        "killed himself"   Asthma Daughter        x 2   Cancer Daughter        pre-cancerous polyp    Diabetes Sister    Cancer Sister    Cervical cancer Daughter        cervical    Allergies Other        all family--seasonal allergies     HOME MEDICATIONS: Allergies as of 01/09/2021       Reactions   Prednisone Other (See Comments)   REACTION: mood swings, nightmares. "Shot doesn't bother me, reaction is just with the pill" she states she has had the steroid injections before. From our records methylprednisone was given to her in 2013 without any complications.        Medication List        Accurate as of January 09, 2021  9:29 AM. If you have any questions, ask your nurse or doctor.  albuterol 108 (90 Base) MCG/ACT inhaler Commonly known as: VENTOLIN HFA INHALE 2 PUFFS INTO THE LUNGS EVERY EIGHT HOURS AS NEEDED FOR WHEEZING OR SHORTNESS OF BREATH.   ALLEGRA ALLERGY PO Take 1 tablet by mouth daily.   aspirin EC 81 MG tablet Take 81 mg by mouth daily.   Basaglar KwikPen 100 UNIT/ML Inject 30 Units into the skin daily.   calcium carbonate 750 MG chewable tablet Commonly known as: TUMS EX Chew 1 tablet by mouth daily as needed for heartburn.   dapagliflozin propanediol 10 MG Tabs tablet Commonly known as: FARXIGA Take 1 tablet (10 mg total) by mouth daily.   FLUoxetine 40 MG capsule Commonly known as: PROZAC TAKE 1 CAPSULE BY MOUTH EVERY DAY   fluticasone 50 MCG/ACT nasal spray Commonly known as: FLONASE Place 2 sprays into both nostrils as needed for allergies or rhinitis.   furosemide 20 MG tablet Commonly known as: LASIX TAKE 1 TABLET BY MOUTH EVERY DAY   gabapentin 300 MG capsule Commonly known as: NEURONTIN Take 2 capsules (600 mg total) by mouth 2 (two) times daily.   hydrALAZINE 25 MG tablet Commonly known as: APRESOLINE Take 25 mg by mouth 2 (two) times daily.   hydrOXYzine 25 MG tablet Commonly known as: ATARAX/VISTARIL TAKE 1/2 TO 1 TABLET BY MOUTH EVERY 8 HOURS AS NEEDED FOR ITCHING   Insulin Pen Needle 32G X 4 MM Misc 1 Device  by Does not apply route daily.   magnesium oxide 400 MG tablet Commonly known as: MAG-OX Take 400 mg by mouth daily.   metFORMIN 500 MG 24 hr tablet Commonly known as: GLUCOPHAGE-XR Take 2 tablets (1,000 mg total) by mouth 2 (two) times daily.   metoprolol succinate 100 MG 24 hr tablet Commonly known as: TOPROL-XL Take 1 tablet (100 mg total) by mouth daily.   montelukast 10 MG tablet Commonly known as: SINGULAIR Take 1 tablet (10 mg total) by mouth at bedtime.   nitroGLYCERIN 0.4 MG SL tablet Commonly known as: NITROSTAT Place 1 tablet (0.4 mg total) under the tongue every 5 (five) minutes as needed for chest pain.   Potassium 99 MG Tabs Take 1 tablet by mouth daily.   rosuvastatin 20 MG tablet Commonly known as: CRESTOR TAKE 1 TABLET BY MOUTH EVERY DAY   Spiriva Respimat 2.5 MCG/ACT Aers Generic drug: Tiotropium Bromide Monohydrate Inhale 2 puffs into the lungs daily.   traZODone 50 MG tablet Commonly known as: DESYREL TAKE 1/2 TO 1 TABLET BY MOUTH AT BEDTIME AS NEEDED FOR SLEEP   Trulicity 1.5 NW/2.9FA Sopn Generic drug: Dulaglutide Inject 1.5 mg into the skin once a week.   valsartan 160 MG tablet Commonly known as: Diovan Take 1 tablet (160 mg total) by mouth daily.   Vitamin D3 25 MCG (1000 UT) Caps Take 1,000 Units by mouth daily.   zinc gluconate 50 MG tablet Take 50 mg by mouth daily.         OBJECTIVE:   Vital Signs: BP 132/88   Pulse 75   Ht 4\' 9"  (1.448 m)   Wt 176 lb (79.8 kg)   SpO2 94%   BMI 38.09 kg/m   Wt Readings from Last 3 Encounters:  01/09/21 176 lb (79.8 kg)  01/01/21 178 lb (80.7 kg)  11/27/20 177 lb (80.3 kg)     Exam: General: Pt appears well and is in NAD  Lungs: Clear with good BS bilat with no rales, rhonchi, or wheezes  Heart: RRR with systolic murmur  Extremities:  No pretibial edema.   Neuro: MS is good with appropriate affect, pt is alert and Ox3    DM foot exam: 01/09/2021   The skin of the feet is  intact without sores or ulcerations. The pedal pulses are 2+ on right and 2+ on left. The sensation is decreased  to a screening 5.07, 10 gram monofilament bilaterally   DATA REVIEWED:  Lab Results  Component Value Date   HGBA1C 8.3 (H) 11/27/2020   HGBA1C 9.3 (H) 08/22/2020   HGBA1C 8.2 (A) 03/14/2020   Lab Results  Component Value Date   MICROALBUR 7.8 (H) 01/05/2018   LDLCALC 45 01/01/2021   CREATININE 0.61 11/18/2020   Lab Results  Component Value Date   MICRALBCREAT 7.5 01/05/2018     Lab Results  Component Value Date   CHOL 130 01/01/2021   HDL 44 01/01/2021   LDLCALC 45 01/01/2021   LDLDIRECT 64.0 09/09/2019   TRIG 266 (H) 01/01/2021   CHOLHDL 3.0 01/01/2021         ASSESSMENT / PLAN / RECOMMENDATIONS:   1) Type 2 Diabetes Mellitus, improving glycemic control , With neuropathic and macrovascular  complications - Most recent A1c of 8.3 %. Goal A1c < 7.0 %.      - Praised the pt on improved glycemic control and weight loss  - She has dietary indiscretions with BG's in 200's and rarely 300's. I have advised her to monitor portions of these items or eliminate them all together    MEDICATIONS:  - Continue Metformin 500 mg , 2 tablet twice daily  - Continue  Farxiga 10 mg , 1 tablet daily in the morning  - Continue Basaglar 30  units daily  - Continue Trulicity 1.5  mg weekly     EDUCATION / INSTRUCTIONS: BG monitoring instructions: Patient is instructed to check her blood sugars 2 times a day Call [facility] if: BG persistently < 70 I reviewed the Rule of 15 for the treatment of hypoglycemia in detail with the patient. Literature supplied.  2) Diabetic complications:  Eye: Does not have known diabetic retinopathy.  Neuro/ Feet: Does  have known diabetic peripheral neuropathy .  Renal: Patient does not have known baseline CKD. She   is  on an ACEI/ARB at present.    3) Peripheral Neuropathy :  - Pt to use capsaicin cream PRN    F/U in 6 months    Signed electronically by: Mack Guise, MD  [facility]  [address] [place], [place] [address] Phone: [phone] FAX: [phone]   CC: Darreld Mclean, [address] [place] [address] Phone: [phone]  Fax: [phone]  Return to Endocrinology clinic as below: Future Appointments  Date Time Provider [place]  01/09/2021  9:30 AM Viviene Thurston, Melanie Crazier, MD [facility]  01/11/2021 10:00 AM [facility] CARE [facility]  01/25/2021  8:15 AM [facility] NM2/TREAD [id number] [facility]  01/25/2021  8:35 AM [facility] CH ECHO 1 [id number] [facility]  02/02/2021 10:45 AM [facility] C[facility] PHARMACIST [facility]  04/02/2021 11:00 AM Copland, Gay Filler, MD [facility]  06/15/2021 10:20 AM Park Liter, MD [facility] None

## 2021-01-09 NOTE — Patient Instructions (Addendum)
-   Keep Up the Good Work ! - Continue Metformin 500 mg , 2 tablet twice daily  - Continue  Farxiga 10 mg , 1 tablet daily in the morning  - Continue Basaglar 30 units daily  - Continue Trulicity 1.5 mg weekly   -Try capsaicin cream  on the feet for tingling   HOW TO TREAT LOW BLOOD SUGARS (Blood sugar LESS THAN 70 MG/DL) Please follow the RULE OF 15 for the treatment of hypoglycemia treatment (when your (blood sugars are less than 70 mg/dL)   STEP 1: Take 15 grams of carbohydrates when your blood sugar is low, which includes:  3-4 GLUCOSE TABS  OR 3-4 OZ OF JUICE OR REGULAR SODA OR ONE TUBE OF GLUCOSE GEL    STEP 2: RECHECK blood sugar in 15 MINUTES STEP 3: If your blood sugar is still low at the 15 minute recheck --> then, go back to STEP 1 and treat AGAIN with another 15 grams of carbohydrates.

## 2021-01-11 ENCOUNTER — Ambulatory Visit (INDEPENDENT_AMBULATORY_CARE_PROVIDER_SITE_OTHER): Payer: Medicare Other

## 2021-01-11 DIAGNOSIS — E785 Hyperlipidemia, unspecified: Secondary | ICD-10-CM | POA: Diagnosis not present

## 2021-01-11 DIAGNOSIS — J441 Chronic obstructive pulmonary disease with (acute) exacerbation: Secondary | ICD-10-CM

## 2021-01-11 DIAGNOSIS — I152 Hypertension secondary to endocrine disorders: Secondary | ICD-10-CM | POA: Diagnosis not present

## 2021-01-11 DIAGNOSIS — E1159 Type 2 diabetes mellitus with other circulatory complications: Secondary | ICD-10-CM | POA: Diagnosis not present

## 2021-01-11 DIAGNOSIS — E119 Type 2 diabetes mellitus without complications: Secondary | ICD-10-CM | POA: Diagnosis not present

## 2021-01-11 DIAGNOSIS — Z23 Encounter for immunization: Secondary | ICD-10-CM | POA: Diagnosis not present

## 2021-01-11 NOTE — Chronic Care Management (AMB) (Signed)
Chronic Care Management   CCM RN Visit Note  01/11/2021 Name: Destiny Ballard MRN: [id number] DOB: Sep 26, 1950  Subjective: Destiny Ballard is a 71 y.o. year old female who is a primary care patient of Copland, Gay Filler, MD. The care management team was consulted for assistance with disease management and care coordination needs.    Engaged with patient by telephone for follow up visit in response to provider referral for case management and/or care coordination services.   Consent to Services:  The patient was given information about Chronic Care Management services, agreed to services, and gave verbal consent prior to initiation of services.  Please see initial visit note for detailed documentation.   Patient agreed to services and verbal consent obtained.   Assessment: Review of patient past medical history, allergies, medications, health status, including review of consultants reports, laboratory and other test data, was performed as part of comprehensive evaluation and provision of chronic care management services.   SDOH (Social Determinants of Health) assessments and interventions performed:    CCM Care Plan  Allergies  Allergen Reactions   Prednisone Other (See Comments)    REACTION: mood swings, nightmares. "Shot doesn't bother me, reaction is just with the pill" she states she has had the steroid injections before. From our records methylprednisone was given to her in 2013 without any complications.    Outpatient Encounter Medications as of 01/11/2021  Medication Sig   albuterol (VENTOLIN HFA) 108 (90 Base) MCG/ACT inhaler INHALE 2 PUFFS INTO THE LUNGS EVERY EIGHT HOURS AS NEEDED FOR WHEEZING OR SHORTNESS OF BREATH.   aspirin EC 81 MG tablet Take 81 mg by mouth daily.   calcium carbonate (TUMS EX) 750 MG chewable tablet Chew 1 tablet by mouth daily as needed for heartburn.   Cholecalciferol (VITAMIN D3) 1000 UNITS CAPS Take 1,000 Units by mouth daily.   dapagliflozin  propanediol (FARXIGA) 10 MG TABS tablet Take 1 tablet (10 mg total) by mouth daily.   Dulaglutide (TRULICITY) 1.5 GH/8.2XH SOPN Inject 1.5 mg into the skin once a week.   Fexofenadine HCl (ALLEGRA ALLERGY PO) Take 1 tablet by mouth daily.   FLUoxetine (PROZAC) 40 MG capsule TAKE 1 CAPSULE BY MOUTH EVERY DAY   fluticasone (FLONASE) 50 MCG/ACT nasal spray Place 2 sprays into both nostrils as needed for allergies or rhinitis.   furosemide (LASIX) 20 MG tablet TAKE 1 TABLET BY MOUTH EVERY DAY   gabapentin (NEURONTIN) 300 MG capsule Take 2 capsules (600 mg total) by mouth 2 (two) times daily.   hydrALAZINE (APRESOLINE) 25 MG tablet Take 25 mg by mouth 2 (two) times daily.   hydrOXYzine (ATARAX/VISTARIL) 25 MG tablet TAKE 1/2 TO 1 TABLET BY MOUTH EVERY 8 HOURS AS NEEDED FOR ITCHING   Insulin Glargine (BASAGLAR KWIKPEN) 100 UNIT/ML Inject 30 Units into the skin daily.   Insulin Pen Needle 32G X 4 MM MISC 1 Device by Does not apply route daily.   magnesium oxide (MAG-OX) 400 MG tablet Take 400 mg by mouth daily.   metFORMIN (GLUCOPHAGE-XR) 500 MG 24 hr tablet Take 2 tablets (1,000 mg total) by mouth 2 (two) times daily.   metoprolol succinate (TOPROL-XL) 100 MG 24 hr tablet Take 1 tablet (100 mg total) by mouth daily.   montelukast (SINGULAIR) 10 MG tablet Take 1 tablet (10 mg total) by mouth at bedtime.   nitroGLYCERIN (NITROSTAT) 0.4 MG SL tablet Place 1 tablet (0.4 mg total) under the tongue every 5 (five) minutes as needed for chest pain.  Potassium 99 MG TABS Take 1 tablet by mouth daily.   rosuvastatin (CRESTOR) 20 MG tablet TAKE 1 TABLET BY MOUTH EVERY DAY   Tiotropium Bromide Monohydrate (SPIRIVA RESPIMAT) 2.5 MCG/ACT AERS Inhale 2 puffs into the lungs daily.   traZODone (DESYREL) 50 MG tablet TAKE 1/2 TO 1 TABLET BY MOUTH AT BEDTIME AS NEEDED FOR SLEEP   valsartan (DIOVAN) 160 MG tablet Take 1 tablet (160 mg total) by mouth daily.   zinc gluconate 50 MG tablet Take 50 mg by mouth daily.   No  facility-administered encounter medications on file as of 01/11/2021.    Patient Active Problem List   Diagnosis Date Noted   Melena 12/18/2020   History of colonic polyps 12/18/2020   Constipation 12/18/2020   Obesity    Iron deficiency anemia    Insulin resistance    Hypertension    Hyperlipidemia    Diabetes mellitus without complication ([place])    Depression    Cervical spondylosis    Anxiety    Pneumonia due to COVID-19 virus 08/21/2020   Hyperlipidemia associated with type 2 diabetes mellitus ([place]) 08/21/2020   Uncontrolled type 2 diabetes mellitus with complication, with long-term current use of insulin ([place]) 03/14/2020   Type 2 diabetes mellitus with hyperglycemia, with long-term current use of insulin ([place]) 12/10/2019   Type 2 diabetes mellitus with diabetic polyneuropathy, with long-term current use of insulin ([place]) 12/10/2019   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   Tinnitus of both ears 11/18/2019   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   Eustachian tube dysfunction, bilateral 11/18/2019   Chronic eczematous otitis externa of both ears 11/18/2019   Obesity (BMI 30-39.9) 08/24/2018   Depression with anxiety 08/24/2018   CAD (coronary artery disease) 08/24/2018   Abnormal cardiovascular stress test 08/24/2018   Lobar pneumonia, unspecified organism ([place]) 11/13/2017   Precordial chest pain    Sepsis ([place]) 09/18/2017   Chronic diastolic CHF (congestive heart failure) ([place]) 09/18/2017   Diabetes mellitus ([place]) 09/08/2017   Left hip pain 10/28/2016   Chronic rhinitis 08/07/2016   COPD with acute exacerbation ([place]) 04/25/2016   Hypertension associated with diabetes ([place]) 01/30/2016   Hypokalemia 01/27/2016   HLD (hyperlipidemia)    Type 2 diabetes mellitus without complication, without long-term current use of insulin ([facility])    OSA (obstructive sleep apnea) 05/06/2014   Dyspnea on exertion 09/02/2012   Chest pain 12/10/2011    Chronic cough 05/11/2010   GERD 06/28/2008   Coronary atherosclerosis 06/28/2008    Conditions to be addressed/monitored:HLD, COPD, and DMII  Care Plan : COPD (Adult)  Updates made by Luretha Rued, RN since 01/11/2021 12:00 AM     Problem: Symptom Exacerbation (COPD) due to Covid pneumonia   Priority: Medium     Long-Range Goal: Symptom Exacerbation Prevented or Minimized   Start Date: 08/30/2020  Expected End Date: 02/26/2021  This Visit's Progress: On track  Recent Progress: On track  Priority: Medium  Note:   Current Barriers:  Knowledge deficits related to long term plan for self management of COPD-hospitalization 08/21/20-08/23/20 due to Covid pneumonia. Reports feeling better, reports occasionally short of breath, but much improved since discharge from hospital. Denies any issues with breathing at this time.  Case Manager Clinical Goal(s): patient will be able to verbalize understanding of COPD action plan and when to seek appropriate levels of medical care  Interventions:  Collaboration with Copland, Gay Filler, MD regarding development and update of comprehensive plan of care as  evidenced by provider attestation and co-signature Inter-disciplinary care team collaboration (see longitudinal plan of care) Provided COPD Action Plan education with client. Discussed medications. denies any issues at this time Reviewed upcoming appointments Encouraged client to continue to work with Clinic pharmacist and complete medication assistance forms. Reinforced provider recommendation to obtain Covid -19 Vaccine. RNCM reviewed locations where client can obtain the vaccine. Patient Goals/Self-Care Activities:  keep all follow-up appointments.  Continue to follow the COPD action plan as needed. Continue to take your medications as prescribed. Continue to work with Clinic pharmacist to complete medication assistance forms Follow Up Plan: Telephone follow up appointment with care management  team member scheduled for: 02/08/21 The patient has been provided with contact information for the care management team and has been advised to call with any health related questions or concerns.      Care Plan : Diabetes Type 2 (Adult)  Updates made by Luretha Rued, RN since 01/11/2021 12:00 AM     Problem: Disease Progression (Diabetes, Type 2)   Priority: Medium     Long-Range Goal: Disease Progression Prevented or Minimized   Start Date: 08/30/2020  Expected End Date: 02/26/2021  This Visit's Progress: On track  Recent Progress: On track  Priority: Medium  Note:   Objective:  Lab Results  Component Value Date   HGBA1C 8.3 (H) 11/27/2020   Lab Results  Component Value Date   CREATININE 0.82 08/23/2020   CREATININE 0.68 08/22/2020   CREATININE 0.69 08/21/2020  Current Barriers:  Knowledge Deficits related to long term self care management of Diabetes. Client also has history of cardiac disease, HLD. Last office visit with endocrinologist 01/09/21. A1C improving 8.3 on 11/27/20 down from 9.3 on 08/22/20. Goal less than 7.0. Client acknowledges that she likes to snack on things like potato chips, cinnamon raison bread. She reports that Trulicity is affordable for her and at last refill the cost was $40. Noted foot exam completed on 01/09/21. Blood pressure 132/88 HR 75 on 01/09/21; 120/60 HR 82 on 01/01/21. Triglycerides 266 on 01/01/21 improved from 366 on 08/31/20.   Case Manager Clinical Goal(s):  patient will demonstrate improved adherence to prescribed treatment plan for diabetes self care/management as evidenced by: scheduling an appointment with endocrinologist contact provider for new or worsened symptoms or questions. Interventions:  Collaboration with Copland, Gay Filler, MD regarding development and update of comprehensive plan of care as evidenced by provider attestation and co-signature Inter-disciplinary care team collaboration (see longitudinal plan of care) Medications  reviewed Encouraged client to continue checking blood sugar per provider recommendation Reviewed scheduled/upcoming provider appointments Encouraged client to complete medication assistance forms provided by embedded clinic pharmacist. Send education related to diet and meal planning Discussed plans with patient for ongoing care management follow up and provided patient with direct contact information for care management team Patient Goals/Self-Care Activities continue to check blood sugars-recommended twice a day by your doctor continue to take medications as prescribed  take the blood sugar log to your doctor visits notify provider for sustained elevations or Blood sugars less than 70. attend provider visits as scheduled and contact your provider with questions or concerns continue to work with embedded clinic pharmacist for medication questions or concerns.  review education material regarding diabetes diet, hyperlipidemia. Be prepared to discuss at our next telephone visit Plan to eat a healthy diet: lean protein, fruits, vegetables, low salt, low in fats. Monitor portion sizes and minimize processed foods. Personal goal: decrease the amount of potato chips I eat  Increase activity per provider recommendations Follow Up Plan: Telephone follow up appointment with care management team member scheduled for: 02/08/21 The patient has been provided with contact information for the care management team and has been advised to call with any health related questions or concerns.       Plan:Telephone follow up appointment with care management team member scheduled for:  02/08/21 and The patient has been provided with contact information for the care management team and has been advised to call with any health related questions or concerns.   Thea Silversmith, RN, MSN, BSN, CCM Care Management Coordinator [facility] [phone]

## 2021-01-11 NOTE — Patient Instructions (Signed)
Visit Information  PATIENT GOALS:  Goals Addressed             This Visit's Progress    Monitor and Manage My Blood Sugar   On track    Timeframe:  Long-Range Goal Priority:  Medium Start Date:    08/30/20                        Expected End Date:    02/26/21                  Follow Up Date 02/08/21   Patient Goals/Self-Care Activities continue to check blood sugars-recommended twice a day by your doctor continue to take medications as prescribed  take the blood sugar log to your doctor visits notify provider for sustained elevations or Blood sugars less than 70. attend provider visits as scheduled and contact your provider with questions or concerns continue to work with embedded clinic pharmacist for medication questions or concerns.  review education material regarding diabetes diet, hyperlipidemia. Be prepared to discuss at our next telephone visit Plan to eat a healthy diet: lean protein, fruits, vegetables, low salt, low in fats. Monitor portion sizes and minimize processed foods. Personal goal: decrease the amount of potato chips I eat Increase activity per provider recommendations   Why is this important?   Checking your blood sugar at home helps to keep it from getting very high or very low.  Writing the results in a diary or log helps the doctor know how to care for you.  Your blood sugar log should have the time, date and the results.  Also, write down the amount of insulin or other medicine that you take.  Other information, like what you ate, exercise done and how you were feeling, will also be helpful.     Notes:       Track and Manage my Respiratory Symptoms   On track    Timeframe:  Long-Range Goal Priority:  Medium Start Date:   08/30/20                        Expected End Date: 02/27/21                       Follow Up Date 02/08/21   Patient Goals/Self-Care Activities:  keep all follow-up appointments.  Continue to follow the COPD action plan as  needed. Continue to take your medications as prescribed. Continue to work with Clinic pharmacist to complete medication assistance forms Follow Up Plan: Telephone follow up appointment with care management team member scheduled for: 02/08/21   Why is this important?   Tracking your symptoms and other information about your health helps your doctor plan your care.  Write down the symptoms, the time of day, what you were doing and what medicine you are taking.  You will soon learn how to manage your symptoms.     Notes:         Patient verbalizes understanding of instructions provided today and agrees to view in [place].   Telephone follow up appointment with care management team member scheduled for: The patient has been provided with contact information for the care management team and has been advised to call with any health related questions or concerns.   Thea Silversmith, RN, MSN, BSN, CCM Care Management Coordinator [facility] [phone]    Diabetes Mellitus and Nutrition, Adult When you have  diabetes, or diabetes mellitus, it is very important to have healthy eating habits because your blood sugar (glucose) levels are greatly affected by what you eat and drink. Eating healthy foods in the right amounts, at about the same times every day, can help you: Control your blood glucose. Lower your risk of heart disease. Improve your blood pressure. Reach or maintain a healthy weight. What can affect my meal plan? Every person with diabetes is different, and each person has different needs for a meal plan. Your health care provider may recommend that you work with a dietitian to make a meal plan that is best for you. Your meal plan may vary depending on factors such as: The calories you need. The medicines you take. Your weight. Your blood glucose, blood pressure, and cholesterol levels. Your activity level. Other health conditions you have, such as heart or kidney  disease. How do carbohydrates affect me? Carbohydrates, also called carbs, affect your blood glucose level more than any other type of food. Eating carbs naturally raises the amount of glucose in your blood. Carb counting is a method for keeping track of how many carbs you eat. Counting carbs is important to keep your blood glucose at a healthy level,especially if you use insulin or take certain oral diabetes medicines. It is important to know how many carbs you can safely have in each meal. This is different for every person. Your dietitian can help you calculate how manycarbs you should have at each meal and for each snack. How does alcohol affect me? Alcohol can cause a sudden decrease in blood glucose (hypoglycemia), especially if you use insulin or take certain oral diabetes medicines. Hypoglycemia can be a life-threatening condition. Symptoms of hypoglycemia, such as sleepiness, dizziness, and confusion, are similar to symptoms of having too much alcohol. Do not drink alcohol if: Your health care provider tells you not to drink. You are pregnant, may be pregnant, or are planning to become pregnant. If you drink alcohol: Do not drink on an empty stomach. Limit how much you use to: 0-1 drink a day for women. 0-2 drinks a day for men. Be aware of how much alcohol is in your drink. In the [place] one drink equals one 12 oz bottle of beer (355 mL), one 5 oz glass of wine (148 mL), or one 1 oz glass of hard liquor (44 mL). Keep yourself hydrated with water, diet soda, or unsweetened iced tea. Keep in mind that regular soda, juice, and other mixers may contain a lot of sugar and must be counted as carbs. What are tips for following this plan?  Reading food labels Start by checking the serving size on the "Nutrition Facts" label of packaged foods and drinks. The amount of calories, carbs, fats, and other nutrients listed on the label is based on one serving of the item. Many items contain more than  one serving per package. Check the total grams (g) of carbs in one serving. You can calculate the number of servings of carbs in one serving by dividing the total carbs by 15. For example, if a food has 30 g of total carbs per serving, it would be equal to 2 servings of carbs. Check the number of grams (g) of saturated fats and trans fats in one serving. Choose foods that have a low amount or none of these fats. Check the number of milligrams (mg) of salt (sodium) in one serving. Most people should limit total sodium intake to less than 2,300 mg  per day. Always check the nutrition information of foods labeled as "low-fat" or "nonfat." These foods may be higher in added sugar or refined carbs and should be avoided. Talk to your dietitian to identify your daily goals for nutrients listed on the label. Shopping Avoid buying canned, pre-made, or processed foods. These foods tend to be high in fat, sodium, and added sugar. Shop around the outside edge of the grocery store. This is where you will most often find fresh fruits and vegetables, bulk grains, fresh meats, and fresh dairy. Cooking Use low-heat cooking methods, such as baking, instead of high-heat cooking methods like deep frying. Cook using healthy oils, such as olive, canola, or sunflower oil. Avoid cooking with butter, cream, or high-fat meats. Meal planning Eat meals and snacks regularly, preferably at the same times every day. Avoid going long periods of time without eating. Eat foods that are high in fiber, such as fresh fruits, vegetables, beans, and whole grains. Talk with your dietitian about how many servings of carbs you can eat at each meal. Eat 4-6 oz (112-168 g) of lean protein each day, such as lean meat, chicken, fish, eggs, or tofu. One ounce (oz) of lean protein is equal to: 1 oz (28 g) of meat, chicken, or fish. 1 egg.  cup (62 g) of tofu. Eat some foods each day that contain healthy fats, such as avocado, nuts, seeds, and  fish. What foods should I eat? Fruits Berries. Apples. Oranges. Peaches. Apricots. Plums. Grapes. Mango. Papaya.Pomegranate. Kiwi. Cherries. Vegetables Lettuce. Spinach. Leafy greens, including kale, chard, collard greens, and mustard greens. Beets. Cauliflower. Cabbage. Broccoli. Carrots. Green beans.Tomatoes. Peppers. Onions. Cucumbers. Brussels sprouts. Grains Whole grains, such as whole-wheat or whole-grain bread, crackers, tortillas,cereal, and pasta. Unsweetened oatmeal. Quinoa. Brown or wild rice. Meats and other proteins Seafood. Poultry without skin. Lean cuts of poultry and beef. Tofu. Nuts. Seeds. Dairy Low-fat or fat-free dairy products such as milk, yogurt, and cheese. The items listed above may not be a complete list of foods and beverages you can eat. Contact a dietitian for more information. What foods should I avoid? Fruits Fruits canned with syrup. Vegetables Canned vegetables. Frozen vegetables with butter or cream sauce. Grains Refined white flour and flour products such as bread, pasta, snack foods, andcereals. Avoid all processed foods. Meats and other proteins Fatty cuts of meat. Poultry with skin. Breaded or fried meats. Processed meat.Avoid saturated fats. Dairy Full-fat yogurt, cheese, or milk. Beverages Sweetened drinks, such as soda or iced tea. The items listed above may not be a complete list of foods and beverages you should avoid. Contact a dietitian for more information. Questions to ask a health care provider Do I need to meet with a diabetes educator? Do I need to meet with a dietitian? What number can I call if I have questions? When are the best times to check my blood glucose? Where to find more information: American Diabetes Association: diabetes.org Academy of Nutrition and Dietetics: www.eatright.[employer] of Diabetes and Digestive and Kidney Diseases: [place] Association of Diabetes Care and Education Specialists:  www.diabeteseducator.org Summary It is important to have healthy eating habits because your blood sugar (glucose) levels are greatly affected by what you eat and drink. A healthy meal plan will help you control your blood glucose and maintain a healthy lifestyle. Your health care provider may recommend that you work with a dietitian to make a meal plan that is best for you. Keep in mind that carbohydrates (carbs) and alcohol have  immediate effects on your blood glucose levels. It is important to count carbs and to use alcohol carefully. This information is not intended to replace advice given to you by your health care provider. Make sure you discuss any questions you have with your healthcare provider. Document Revised: 06/08/2019 Document Reviewed: 06/08/2019 Elsevier Patient Education  2021 [employer].     COPD Action Plan A COPD action plan is a description of what to do when you have a flare (exacerbation) of chronic obstructive pulmonary disease (COPD). Your action plan is a color-coded plan that lists the symptoms that indicate whether your condition is under control and what actions to take. If you have symptoms in the green zone, it means you are doing well that day. If you have symptoms in the yellow zone, it means you are having a bad day or an exacerbation. If you have symptoms in the red zone, you need urgent medical care. Follow the plan that you and your health care provider developed. Review yourplan with your health care provider at each visit. Red zone Symptoms in this zone mean that you should get medical help right away. They include: Feeling very short of breath, even when you are resting. Not being able to do any activities because of poor breathing. Not being able to sleep because of poor breathing. Fever or shaking chills. Feeling confused or very sleepy. Chest pain. Coughing up blood. If you have any of these symptoms, call emergency services (911 in the [place]  or go to the nearest emergency room. Yellow zone Symptoms in this zone mean that your condition may be getting worse. They include: Feeling more short of breath than usual. Having less energy for daily activities than usual. Phlegm or mucus that is thicker than usual. Needing to use your rescue inhaler or nebulizer more often than usual. More ankle swelling than usual. Coughing more than usual. Feeling like you have a chest cold. Trouble sleeping due to COPD symptoms. Decreased appetite. COPD medicines not helping as much as usual. If you experience any "yellow" symptoms: Keep taking your daily medicines as directed. Use your quick-relief inhaler as told by your health care provider. If you were prescribed steroid medicine to take by mouth (oral medicine), start taking it as told by your health care provider. If you were prescribed an antibiotic medicine, start taking it as told by your health care provider. Do not stop taking the antibiotic even if you start to feel better. Use oxygen as told by your health care provider. Get more rest. Do your pursed-lip breathing exercises. Do not smoke. Avoid any irritants in the air. If your signs and symptoms do not improve after taking these steps, call yourhealth care provider right away. Green zone Symptoms in this zone mean that you are doing well. They include: Being able to do your usual activities and exercise. Having the usual amount of coughing, including the same amount of phlegm or mucus. Being able to sleep well. Having a good appetite. Where to find more information: You can find more information about COPD from: American Lung Association, My COPD Action Plan: www.lung.org COPD Foundation: www.copdfoundation.[place]: https://wilson-eaton.com/ Follow these instructions at home: Continue taking your daily medicines as told by your health care provider. Make sure you receive all the immunizations that  your health care provider recommends, especially the pneumococcal and influenza vaccines. Wash your hands often with soap and water. Have family members wash their hands too. Regular hand washing  can help prevent infections. Follow your usual exercise and diet plan. Avoid irritants in the air, such as smoke. Do not use any products that contain nicotine or tobacco. These products include cigarettes, chewing tobacco, and vaping devices, such as e-cigarettes. If you need help quitting, ask your health care provider. Summary A COPD action plan tells you what to do when you have a flare (exacerbation) of chronic obstructive pulmonary disease (COPD). Follow each action plan for your symptoms. If you have any symptoms in the red zone, call emergency services (911 in the [place] or go to the nearest emergency room. This information is not intended to replace advice given to you by your health care provider. Make sure you discuss any questions you have with your healthcare provider. Document Revised: 05/09/2020 Document Reviewed: 05/09/2020 Elsevier Patient Education  2022 [employer].

## 2021-01-12 ENCOUNTER — Telehealth: Payer: Self-pay | Admitting: Pharmacist

## 2021-01-12 NOTE — Chronic Care Management (AMB) (Signed)
    Chronic Care Management Pharmacy Assistant   Name: Destiny Ballard  MRN: [id number] DOB: 03-14-1951   Reason for Encounter: Patient assistance update     Medications: Outpatient Encounter Medications as of 01/12/2021  Medication Sig   albuterol (VENTOLIN HFA) 108 (90 Base) MCG/ACT inhaler INHALE 2 PUFFS INTO THE LUNGS EVERY EIGHT HOURS AS NEEDED FOR WHEEZING OR SHORTNESS OF BREATH.   aspirin EC 81 MG tablet Take 81 mg by mouth daily.   calcium carbonate (TUMS EX) 750 MG chewable tablet Chew 1 tablet by mouth daily as needed for heartburn.   Cholecalciferol (VITAMIN D3) 1000 UNITS CAPS Take 1,000 Units by mouth daily.   dapagliflozin propanediol (FARXIGA) 10 MG TABS tablet Take 1 tablet (10 mg total) by mouth daily.   Dulaglutide (TRULICITY) 1.5 EH/2.1YY SOPN Inject 1.5 mg into the skin once a week.   Fexofenadine HCl (ALLEGRA ALLERGY PO) Take 1 tablet by mouth daily.   FLUoxetine (PROZAC) 40 MG capsule TAKE 1 CAPSULE BY MOUTH EVERY DAY   fluticasone (FLONASE) 50 MCG/ACT nasal spray Place 2 sprays into both nostrils as needed for allergies or rhinitis.   furosemide (LASIX) 20 MG tablet TAKE 1 TABLET BY MOUTH EVERY DAY   gabapentin (NEURONTIN) 300 MG capsule Take 2 capsules (600 mg total) by mouth 2 (two) times daily.   hydrALAZINE (APRESOLINE) 25 MG tablet Take 25 mg by mouth 2 (two) times daily.   hydrOXYzine (ATARAX/VISTARIL) 25 MG tablet TAKE 1/2 TO 1 TABLET BY MOUTH EVERY 8 HOURS AS NEEDED FOR ITCHING   Insulin Glargine (BASAGLAR KWIKPEN) 100 UNIT/ML Inject 30 Units into the skin daily.   Insulin Pen Needle 32G X 4 MM MISC 1 Device by Does not apply route daily.   magnesium oxide (MAG-OX) 400 MG tablet Take 400 mg by mouth daily.   metFORMIN (GLUCOPHAGE-XR) 500 MG 24 hr tablet Take 2 tablets (1,000 mg total) by mouth 2 (two) times daily.   metoprolol succinate (TOPROL-XL) 100 MG 24 hr tablet Take 1 tablet (100 mg total) by mouth daily.   montelukast (SINGULAIR) 10 MG tablet Take  1 tablet (10 mg total) by mouth at bedtime.   nitroGLYCERIN (NITROSTAT) 0.4 MG SL tablet Place 1 tablet (0.4 mg total) under the tongue every 5 (five) minutes as needed for chest pain.   Potassium 99 MG TABS Take 1 tablet by mouth daily.   rosuvastatin (CRESTOR) 20 MG tablet TAKE 1 TABLET BY MOUTH EVERY DAY   Tiotropium Bromide Monohydrate (SPIRIVA RESPIMAT) 2.5 MCG/ACT AERS Inhale 2 puffs into the lungs daily.   traZODone (DESYREL) 50 MG tablet TAKE 1/2 TO 1 TABLET BY MOUTH AT BEDTIME AS NEEDED FOR SLEEP   valsartan (DIOVAN) 160 MG tablet Take 1 tablet (160 mg total) by mouth daily.   zinc gluconate 50 MG tablet Take 50 mg by mouth daily.   No facility-administered encounter medications on file as of 01/12/2021.    Called patient to check on patient assistance application for Basaglar, Farxiga, Spiriva and trulicity. Patient states she has not completed the forms. Patient states her insurance covers trulicity and her copay is only $40 which is affordable per patient. Patient states she will complete other forms and send to her primary care.  Lizbeth Bark Clinical Pharmacist Assistant [phone]

## 2021-01-24 ENCOUNTER — Telehealth: Payer: Self-pay

## 2021-01-24 ENCOUNTER — Ambulatory Visit: Payer: Medicare Other | Admitting: Family Medicine

## 2021-01-24 NOTE — Telephone Encounter (Signed)
Attempted to contact the patient, with instructions for her stress test. Will try again later. S.Almadelia Looman EMTP

## 2021-01-25 ENCOUNTER — Ambulatory Visit (HOSPITAL_COMMUNITY): Payer: Medicare Other | Attending: Cardiology

## 2021-01-25 ENCOUNTER — Other Ambulatory Visit: Payer: Self-pay

## 2021-01-25 ENCOUNTER — Ambulatory Visit (HOSPITAL_BASED_OUTPATIENT_CLINIC_OR_DEPARTMENT_OTHER): Payer: Medicare Other

## 2021-01-25 DIAGNOSIS — E1159 Type 2 diabetes mellitus with other circulatory complications: Secondary | ICD-10-CM

## 2021-01-25 DIAGNOSIS — R079 Chest pain, unspecified: Secondary | ICD-10-CM

## 2021-01-25 DIAGNOSIS — I152 Hypertension secondary to endocrine disorders: Secondary | ICD-10-CM

## 2021-01-25 DIAGNOSIS — R072 Precordial pain: Secondary | ICD-10-CM

## 2021-01-25 LAB — ECHOCARDIOGRAM COMPLETE
AR max vel: 1.68 cm2
AV Area VTI: 1.64 cm2
AV Area mean vel: 1.56 cm2
AV Mean grad: 8.4 mmHg
AV Peak grad: 15.7 mmHg
Ao pk vel: 1.98 m/s
Area-P 1/2: 3.99 cm2
Height: 57 in
S' Lateral: 2.5 cm
Weight: 2816 oz

## 2021-01-25 LAB — MYOCARDIAL PERFUSION IMAGING
LV dias vol: 52 mL (ref 46–106)
LV sys vol: 18 mL
Peak HR: 93 {beats}/min
Rest HR: 75 {beats}/min
SDS: 0
SRS: 0
SSS: 0
TID: 0.97

## 2021-01-25 MED ORDER — REGADENOSON 0.4 MG/5ML IV SOLN
0.4000 mg | Freq: Once | INTRAVENOUS | Status: AC
  Administered 2021-01-25: 0.4 mg via INTRAVENOUS

## 2021-01-25 MED ORDER — TECHNETIUM TC 99M TETROFOSMIN IV KIT
30.1000 | PACK | Freq: Once | INTRAVENOUS | Status: AC | PRN
Start: 2021-01-25 — End: 2021-01-25
  Administered 2021-01-25: 30.1 via INTRAVENOUS
  Filled 2021-01-25: qty 31

## 2021-01-25 MED ORDER — TECHNETIUM TC 99M TETROFOSMIN IV KIT
10.6000 | PACK | Freq: Once | INTRAVENOUS | Status: AC | PRN
Start: 2021-01-25 — End: 2021-01-25
  Administered 2021-01-25: 10.6 via INTRAVENOUS
  Filled 2021-01-25: qty 11

## 2021-02-02 ENCOUNTER — Ambulatory Visit (INDEPENDENT_AMBULATORY_CARE_PROVIDER_SITE_OTHER): Payer: Medicare Other | Admitting: Pharmacist

## 2021-02-02 DIAGNOSIS — E1159 Type 2 diabetes mellitus with other circulatory complications: Secondary | ICD-10-CM

## 2021-02-02 DIAGNOSIS — I152 Hypertension secondary to endocrine disorders: Secondary | ICD-10-CM | POA: Diagnosis not present

## 2021-02-02 DIAGNOSIS — I25118 Atherosclerotic heart disease of native coronary artery with other forms of angina pectoris: Secondary | ICD-10-CM

## 2021-02-02 DIAGNOSIS — E785 Hyperlipidemia, unspecified: Secondary | ICD-10-CM

## 2021-02-02 DIAGNOSIS — E119 Type 2 diabetes mellitus without complications: Secondary | ICD-10-CM | POA: Diagnosis not present

## 2021-02-02 DIAGNOSIS — J441 Chronic obstructive pulmonary disease with (acute) exacerbation: Secondary | ICD-10-CM | POA: Diagnosis not present

## 2021-02-03 NOTE — Chronic Care Management (AMB) (Signed)
Chronic Care Management Pharmacy Note  02/03/2021 Name:  Destiny Ballard MRN:  [id number] DOB:  Jul 31, 1950  Subjective: Destiny Ballard is an 70 y.o. year old female who is a primary patient of Copland, Gay Filler, MD.  The CCM team was consulted for assistance with disease management and care coordination needs.    Engaged with patient by telephone for follow up visit in response to provider referral for pharmacy case management and/or care coordination services.   Consent to Services:  The patient was given information about Chronic Care Management services, agreed to services, and gave verbal consent prior to initiation of services.  Please see initial visit note for detailed documentation.   Patient Care Team: Copland, Gay Filler, MD as PCP - General (Family Medicine) Brien Few, MD as Consulting Physician (Obstetrics and Gynecology) Luretha Rued, RN as Case Manager [name], PharmD (Pharmacist)  Recent office visits: 11/27/2020 - PCP (Dr Lorelei Pont) F/U urgent care visit for headaches, hansues and numbness of left arm. No med changes; referred back to cardio  Recent consult visits: 01/09/2021 - Endo (Dr Kelton Pillar) A1c improved. No med changes. Recommended tighter diet adherence.  01/01/2021 - Cardio (dr Agustin Cree) F/U CAD. No med changes. ECHO and Lexiscan ordered. Given nitroglycerin. Declined admission. F/U with cardio.  Hospital visits: 11/18/2020 - ED visit for precordial / chest pain  Objective:  Lab Results  Component Value Date   CREATININE 0.61 11/18/2020   CREATININE 0.62 09/04/2020   CREATININE 0.82 08/23/2020    Lab Results  Component Value Date   HGBA1C 8.3 (H) 11/27/2020   Last diabetic Eye exam: No results found for: HMDIABEYEEXA  Last diabetic Foot exam: No results found for: HMDIABFOOTEX      Component Value Date/Time   CHOL 130 01/01/2021 1144   TRIG 266 (H) 01/01/2021 1144   HDL 44 01/01/2021 1144   CHOLHDL 3.0 01/01/2021 1144    CHOLHDL 3 09/09/2019 1128   VLDL 72.6 (H) 09/09/2019 1128   LDLCALC 45 01/01/2021 1144   LDLDIRECT 64.0 09/09/2019 1128    Hepatic Function Latest Ref Rng & Units 11/18/2020 09/04/2020 08/23/2020  Total Protein 6.5 - 8.1 g/dL 7.2 6.6 7.0  Albumin 3.5 - 5.0 g/dL 3.6 3.7 3.2(L)  AST 15 - 41 U/L 20 19 16   ALT 0 - 44 U/L 20 28 28   Alk Phosphatase 38 - 126 U/L 78 101 79  Total Bilirubin 0.3 - 1.2 mg/dL 0.3 0.6 0.4  Bilirubin, Direct 0.0 - 0.2 mg/dL - - -    Lab Results  Component Value Date/Time   TSH 2.361 01/27/2016 02:52 AM   TSH 0.83 03/24/2014 11:14 AM   TSH 0.88 09/10/2013 02:41 PM    CBC Latest Ref Rng & Units 11/18/2020 09/04/2020 08/23/2020  WBC 4.0 - 10.5 K/uL 10.8(H) 9.0 9.7  Hemoglobin 12.0 - 15.0 g/dL 12.6 12.1 12.4  Hematocrit 36.0 - 46.0 % 39.6 37.6 39.9  Platelets 150 - 400 K/uL 287 236.0 230    Lab Results  Component Value Date/Time   VD25OH 26 (L) 09/16/2011 10:58 AM    Clinical ASCVD: Yes  The 10-year ASCVD risk score Mikey Bussing DC Jr., et al., 2013) is: 24.9%   Values used to calculate the score:     Age: 77 years     Sex: Female     Is Non-Hispanic African American: No     Diabetic: Yes     Tobacco smoker: No     Systolic Blood Pressure: 572 mmHg  Is BP treated: Yes     HDL Cholesterol: 44 mg/dL     Total Cholesterol: 130 mg/dL    Social History   Tobacco Use  Smoking Status Never   Passive exposure: Yes  Smokeless Tobacco Never   BP Readings from Last 3 Encounters:  01/09/21 132/88  01/01/21 120/60  11/27/20 134/72   Pulse Readings from Last 3 Encounters:  01/09/21 75  01/01/21 82  11/27/20 94   Wt Readings from Last 3 Encounters:  01/25/21 176 lb (79.8 kg)  01/09/21 176 lb (79.8 kg)  01/01/21 178 lb (80.7 kg)    Assessment: Review of patient past medical history, allergies, medications, health status, including review of consultants reports, laboratory and other test data, was performed as part of comprehensive evaluation and provision  of chronic care management services.   SDOH:  (Social Determinants of Health) assessments and interventions performed:    CCM Care Plan  Allergies  Allergen Reactions   Prednisone Other (See Comments)    REACTION: mood swings, nightmares. "Shot doesn't bother me, reaction is just with the pill" she states she has had the steroid injections before. From our records methylprednisone was given to her in 2013 without any complications.    Medications Reviewed Today     Reviewed by [name], PharmD (Pharmacist) on 02/03/21 at [place] List Status: <None>   Medication Order Taking? Sig Documenting Provider Last Dose Status Informant  albuterol (VENTOLIN HFA) 108 (90 Base) MCG/ACT inhaler [id number] Yes INHALE 2 PUFFS INTO THE LUNGS EVERY EIGHT HOURS AS NEEDED FOR WHEEZING OR SHORTNESS OF BREATH. Jonetta Osgood, MD Taking Active   aspirin EC 81 MG tablet [id number] Yes Take 81 mg by mouth daily. [provider] Taking Active Self  calcium carbonate (TUMS EX) 750 MG chewable tablet [id number] Yes Chew 1 tablet by mouth daily as needed for heartburn. [provider] Taking Active   Cholecalciferol (VITAMIN D3) 1000 UNITS CAPS [id number] Yes Take 1,000 Units by mouth daily. [provider] Taking Active Self  dapagliflozin propanediol (FARXIGA) 10 MG TABS tablet [id number] Yes Take 1 tablet (10 mg total) by mouth daily. Shamleffer, Melanie Crazier, MD Taking Active Self  Dulaglutide (TRULICITY) 1.5 PR/9.4VO SOPN [id number] Yes Inject 1.5 mg into the skin once a week. Shamleffer, Melanie Crazier, MD Taking Active   Fexofenadine HCl [facility] ALLERGY PO) [id number] Yes Take 1 tablet by mouth daily. [provider] Taking Active Self  FLUoxetine (PROZAC) 40 MG capsule [id number] Yes TAKE 1 CAPSULE BY MOUTH EVERY DAY Copland, Gay Filler, MD Taking Active   fluticasone (FLONASE) 50 MCG/ACT nasal spray [id number] Yes Place 2 sprays into both nostrils as needed for  allergies or rhinitis. [provider] Taking Active   furosemide (LASIX) 20 MG tablet [id number] Yes TAKE 1 TABLET BY MOUTH EVERY DAY Burtis Junes, NP Taking Active   gabapentin (NEURONTIN) 300 MG capsule [id number] Yes Take 2 capsules (600 mg total) by mouth 2 (two) times daily. Copland, Gay Filler, MD Taking Active   hydrALAZINE (APRESOLINE) 25 MG tablet [id number] Yes Take 25 mg by mouth 2 (two) times daily. [provider] Taking Active   hydrOXYzine (ATARAX/VISTARIL) 25 MG tablet [id number] Yes TAKE 1/2 TO 1 TABLET BY MOUTH EVERY 8 HOURS AS NEEDED FOR ITCHING Copland, Gay Filler, MD Taking Active            Med Note Alric Ran Jan 01, 2021 10:53 AM)    Insulin [facility])  100 UNIT/ML [id number] Yes Inject 30 Units into the skin daily. Shamleffer, Melanie Crazier, MD Taking Active   Insulin Pen Needle 32G X 4 MM MISC [id number] Yes 1 Device by Does not apply route daily. Shamleffer, Melanie Crazier, MD Taking Active   magnesium oxide (MAG-OX) 400 MG tablet [id number] Yes Take 400 mg by mouth daily. [provider] Taking Active Self  metFORMIN (GLUCOPHAGE-XR) 500 MG 24 hr tablet [id number] Yes Take 2 tablets (1,000 mg total) by mouth 2 (two) times daily. Copland, Gay Filler, MD Taking Active   metoprolol succinate (TOPROL-XL) 100 MG 24 hr tablet [id number] Yes Take 1 tablet (100 mg total) by mouth daily. Copland, Gay Filler, MD Taking Active   montelukast (SINGULAIR) 10 MG tablet [id number] Yes Take 1 tablet (10 mg total) by mouth at bedtime. Copland, Gay Filler, MD Taking Active   nitroGLYCERIN (NITROSTAT) 0.4 MG SL tablet [id number] Yes Place 1 tablet (0.4 mg total) under the tongue every 5 (five) minutes as needed for chest pain. Copland, Gay Filler, MD Taking Active   Potassium 99 MG TABS [id number] Yes Take 1 tablet by mouth daily. [provider] Taking Active Self  rosuvastatin (CRESTOR) 20 MG tablet [id number] Yes TAKE 1 TABLET BY MOUTH  EVERY DAY Copland, Gay Filler, MD Taking Active   Tiotropium Bromide Monohydrate (SPIRIVA RESPIMAT) 2.5 MCG/ACT AERS [id number] Yes Inhale 2 puffs into the lungs daily. Colon Branch, MD Taking Active   traZODone (DESYREL) 50 MG tablet [id number] Yes TAKE 1/2 TO 1 TABLET BY MOUTH AT BEDTIME AS NEEDED FOR SLEEP Copland, Gay Filler, MD Taking Active Self  valsartan (DIOVAN) 160 MG tablet [id number] Yes Take 1 tablet (160 mg total) by mouth daily. Copland, Gay Filler, MD Taking Active   zinc gluconate 50 MG tablet [id number] Yes Take 50 mg by mouth daily. [provider] Taking Active Self            Patient Active Problem List   Diagnosis Date Noted   Melena 12/18/2020   History of colonic polyps 12/18/2020   Constipation 12/18/2020   Obesity    Iron deficiency anemia    Insulin resistance    Hypertension    Hyperlipidemia    Diabetes mellitus without complication ([place])    Depression    Cervical spondylosis    Anxiety    Pneumonia due to COVID-19 virus 08/21/2020   Hyperlipidemia associated with type 2 diabetes mellitus ([place]) 08/21/2020   Uncontrolled type 2 diabetes mellitus with complication, with long-term current use of insulin ([place]) 03/14/2020   Type 2 diabetes mellitus with hyperglycemia, with long-term current use of insulin ([place]) 12/10/2019   Type 2 diabetes mellitus with diabetic polyneuropathy, with long-term current use of insulin ([place]) 12/10/2019   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   Tinnitus of both ears 11/18/2019   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   Eustachian tube dysfunction, bilateral 11/18/2019   Chronic eczematous otitis externa of both ears 11/18/2019   Obesity (BMI 30-39.9) 08/24/2018   Depression with anxiety 08/24/2018   CAD (coronary artery disease) 08/24/2018   Abnormal cardiovascular stress test 08/24/2018   Lobar pneumonia, unspecified organism ([place]) 11/13/2017   Precordial chest pain    Sepsis ([place])  09/18/2017   Chronic diastolic CHF (congestive heart failure) ([place]) 09/18/2017   Diabetes mellitus ([place]) 09/08/2017   Left hip pain 10/28/2016   Chronic rhinitis 08/07/2016   COPD with acute exacerbation ([place]) 04/25/2016   Hypertension associated with diabetes ([place]) 01/30/2016  Hypokalemia 01/27/2016   HLD (hyperlipidemia)    Type 2 diabetes mellitus without complication, without long-term current use of insulin (HCC)    OSA (obstructive sleep apnea) 05/06/2014   Dyspnea on exertion 09/02/2012   Chest pain 12/10/2011   Chronic cough 05/11/2010   GERD 06/28/2008   Coronary atherosclerosis 06/28/2008    Immunization History  Administered Date(s) Administered   Fluad Quad(high Dose 65+) 09/04/2020   Influenza Whole 04/14/2010   Influenza, High Dose Seasonal PF 04/03/2017, 05/11/2018, 03/05/2019   Influenza,inj,Quad PF,6+ Mos 05/10/2013, 04/05/2014, 05/22/2015, 04/25/2016   Pneumococcal Conjugate-13 07/24/2016   Pneumococcal Polysaccharide-23 04/05/2014, 05/22/2015   Tdap 07/11/2014, 03/05/2019   Zoster Recombinat (Shingrix) 03/05/2019, 06/05/2019    Conditions to be addressed/monitored: CAD, HTN, HLD, COPD, DMII, and Depression  Care Plan : General Pharmacy (Adult)  Updates made by [name], PHARMD since 02/03/2021 12:00 AM     Problem: Medication Adherence (Wellness)      Long-Range Goal: Medication Adherence Maintained   This Visit's Progress: On track  Note:   Current Barriers:  Unable to independently afford treatment regimen Unable to achieve control of type 2 DM, HTN   Pharmacist Clinical Goal(s):  Over the next 90 days, patient will achieve adherence to monitoring guidelines and medication adherence to achieve therapeutic efficacy achieve control of diabetes and HTN as evidenced by meeting goal as described in patient's care plan,  through collaboration with PharmD and provider.   Interventions: 1:1 collaboration with Copland, Gay Filler, MD regarding  development and update of comprehensive plan of care as evidenced by provider attestation and co-signature Inter-disciplinary care team collaboration (see longitudinal plan of care) Comprehensive medication review performed; medication list updated in electronic medical record  Diabetes: Uncontrolled but improving; A1c decreased from 9.3% to 8.3%; Goal A1c is < 7.0% Current treatment: Farxiga 3m daily Basaglar 30 units daily Trulicity 15.3IRweekly  metformin ER 5035m- take 2 tablets twice a day Patient reports she will occasionally skip Basaglar dose due to concern that BG will drop too low. She reports if BG is 90 to 120 in morning she will skip Basaglar.  She states she is taking all other meds as prescribed but pharmacy confirmed she has not filled FaIrann 2022 yet. Asked her about dose of FaIrannd she states it is affordable with manufacturer coupons.  Current glucose readings: 110 to 150 fasting; usual post prandial around 180 to 220's but has had highest reading of 341.  Denies hypoglycemic/hyperglycemic symptoms Interventions:  Educated on importance of taking all medication for diabetes as prescribed (A1c is improved - could be at goal with improved adhernece)  Education provided about long acting insulin and risk of hypoglycemia. Recommended that if she is concerned about BG dropping too low to lower dose of Basaglar by half instead of skipping altogether.  Reviewed signs and symptoms of hypoglycemia and how to treat.  If A1c is not at goal in future, could increase dose of Trulicity to 3 or 4.4.4RXeekly   Hypertension: Controlled; BP goal 140/90 Current treatment:  hydralazine 2554mwice a day;  furosemide 50m26mily; Valsartan 160mg21mly  metoprolol succinate 100mg 73my. Current home readings: patient did not have numbers to report Interventions:  Reviewed BP goals and improtance of obtaining BP goals to decrease risk of stroke and renal disease.    COPD: Controlled; Goal: prevent exacerbations and hospitalizations     Current treatment:  Spiriva 2.5mg - 64mnhalation once a day;  Albuterol inhaler as needed for  shortness of breath and wheezing;  montelukast 37m daily at bedtime;  benzonatate 207mup to 3 times a day as needed for cough.   one exacerbation requiring treatment in the last 6 months (related to COVenetieIntervention:  Application for Spiriva PAP has been mailed to patient to complete and sign in April 2022. Patient was also up for savings card that might lower cost of Spiriva to $0.  Continue above medication regimen for COPD  Depression with Anxiety:  Controlled with current therapy of trazodone 5081m take 0.5 tablet = 100m73ms; fluoxetine 40mg36mly Recommend continue current regimen for depression and anxiety.  Hyperlipidemia / CAD:  Controlled; LDL goal <70 current therapy: rosuvastatin 20mg 59my ASA 81mg d5m Interventions:  Discussed improtance of taking statin in prevention of cardiovascular disease Patient declined need for refill for rosuvastatin today. Plan to continue to monitor adherence.  Discussed limiting intake of saturated and trans fat        Medication Assistance:  Applications have been given to patient for Spiriva, FarxigaWilder Gladesaglar but have not been returned with patient portion completed. Patient reminded to complete and return to office.   Patient's preferred pharmacy is:  [id number] [address], [place] [phone] #[id number] [phone] Up:  Patient agrees to Care Plan and Follow-up.  Plan: Telephone follow up appointment with care management team member scheduled for:  2 to 4 weeks to reassess adherence.   Fleeta Kunde EckCherre RobinsClinical  Pharmacist [facility]

## 2021-02-03 NOTE — Patient Instructions (Signed)
Visit Information  PATIENT GOALS:  Goals Addressed             This Visit's Progress    Chronic Care Management Pharmacy Care Plan   On track    Current Barriers:  Patient states Stiolto copay is high especially with other medications she takes that have brand / high copays Unable to achieve control of hypertension (high blood pressure) and diabetes   Chronic Obstructive Pulmonary Disease: Pharmacist Clinical Goal(s):  Over the next 90 days, patient will continue to adhere to current inhaler regimen and identify potential cost savings through collaboration with PharmD and provider.  Current treatment:  Spiriva 2.'5mg'$  - 1 inhalation once a day;  Albuterol inhaler as needed for shortness of breath and wheezing;  montelukast '10mg'$  daily at bedtime;  benzonatate '200mg'$  up to 3 times a day as needed for cough.  Interventions:  Recommended continue current regimen.  Started application process for Spiriva Inhaler thru [place] program Patient self care activities - Over the next 90 days patient will: Take medications as prescribed Collaborate with provider on medication access solutions Bring income documents to PCP office and sign application for patient assistance. Patient reminded to complete application.   Hypertension / High Blood Pressure: Pharmacist Clinical Goal(s):  Over the next 90 days, patient will achieve adherence to monitoring guidelines and medication adherence to achieve therapeutic efficacy. Also will achieve control of blood pressure as evidenced by home BP readings < 140/90 through collaboration with PharmD and provider.  Current treatment:  Valsartan '160mg'$  daily metoprolol succinate '100mg'$  daily hydralazine '25mg'$  twice a day  furosemide '20mg'$  daily; Interventions: Educated on BP goals and improtance of obtaining BP goals to decrease risk of stroke and renal disease Reviewed refill history with pharmacy Patient Self-Care Activities - Over the next 180 days patient  will:  Focus on medication adherence by taking all medications as prescribed Check blood pressure 2 to 3 times per week, document, and provide at future appointments  Diabetes: Pharmacist Clinical Goal(s):  Over the next 90 days, patient will achieve adherence to monitoring guidelines and medication adherence to achieve therapeutic efficacy Achieve control of diabetes as evidenced by meeting goal of A1c <7.0% through collaboration with PharmD and provider.  Lab Results  Component Value Date   HGBA1C 8.3 (H) 11/27/2020  Current treatment: Farxiga '10mg'$  daily Basaglar 30 units daily Trulicity 1.'5mg'$  weekly metformin ER '500mg'$  - take 2 tablets twice a day Interventions: Educated on importance of taking all medication for diabetes as prescribed.  Collaborated with PCP and endocrinologist to improve medication access. Started paperwork for Cruzita Lederer and Trulicity patient assistance programs.   Discussed taking above medication regimen as prescribed to reach A1c goal Reviewed signs and symptoms of hypoglycemia and how to treat.  Patient Self-Care Activities - Over the next 180 days patient will:  Focus on medication adherence by taking all medications as prescribed If you feel blood glucose might be too low to take Basaglar, take half dose instead of skipping Basaglar completely. (Remember Nancee Liter is a long acting insulin and skipping doses can lead to high blood glucose readings later) Check blood glucose 2 times per day; document, and provide at future appointments Collaborate with clinical pharmacist and provider on medication access solutions.   Depression with Anxiety:  Pharmacist Clinical Goal(s):  Over the next 90 days, patient will continue to adhere to medication regimen for depression and anxiety while minimizing potential side effects Current treatment:  trazodone '50mg'$  - take 0.5 tablet = '25mg'$   qhs fluoxetine '40mg'$  daily Interventions:  Recommend continue current regimen  for depression and anxiety. Patient self care activities - Over the next 90 days patient will: Take medications as prescribed  Hyperlipidemia / CAD:  Pharmacist Clinical Goal(s):  Over the next 90 days, patient will maintain LDL of <70 Current treatment:  Rosuvastatin '20mg'$  daily  Aspirin '81mg'$  daily Interventions:  Discussed following diet low in sugar and saturated fat.  Discussed improtance of taking statin in prevention of cardiovascular disease Patient self care activities - Over the next 90 days patient will: Continue current medications as prescribed for heart / cholesterol          Patient verbalizes understanding of instructions provided today and agrees to view in [place].   Telephone follow up appointment with care management team member scheduled for: 2 to 4 weeks  [name], PharmD Clinical Pharmacist [facility]

## 2021-02-08 ENCOUNTER — Telehealth: Payer: Medicare Other

## 2021-02-08 ENCOUNTER — Telehealth: Payer: Self-pay

## 2021-02-08 NOTE — Progress Notes (Addendum)
[facility] [place], [place], [place] [address] [phone]  Date:  02/09/2021   Name:  Destiny Ballard   DOB:  [birth year]   MRN:  [id number]  PCP:  Darreld Mclean, MD    Chief Complaint: Diarrhea (Started sunday)   History of Present Illness:  Destiny Ballard is a 70 y.o. very pleasant female patient who presents with the following:  History of CAD, HTN, CHF, OSA, COPD, DM, GERD, HLD Lab Results  Component Value Date   HGBA1C 8.3 (H) 11/27/2020    Pt here today with concern of diarrhea Last seen by myself in May  She is seeing endocrinology for her DM- most recent visit last month She also has est care with cardiology here at the [place] -most recent visit last month Coronary artery disease status post PTCA and stenting of mid LAD in 2007.  Last cardiac catheterization 2019 showed nonobstructive lesions.  I will schedule her to have Lexiscan make sure that she got no inducible ischemia.  In the meantime we will continue antiplatelet therapy as well as statin. Dyslipidemia she is on statin which I will continue.  I see last cholesterol from February 2021.  We will schedule him to have fasting lipid profile done Essential hypertension: Blood pressure seems to well controlled continue present management. Diabetes not well controlled.  Her hemoglobin A1c from May of this year was 8.3.  I told her that this is important to take better of her diabetes.  She tells me that she is on Trulicity right now her sugar is much better. I will schedule her to have Lexiscan to rule out inducible ischemia, also echocardiogram will be done to assess heart murmur I heard on physical examination.   She had declined covid vaccine- now done!   Eye exam Mammogram  She was driving back from [place] this past Sunday-today is Friday- and she got diarrhea No vomiting She is not aware of what might have caused this.  No one  else had illness, and she did not eat anything unusual Her diarrhea is finally improved- she ate a meal last night and this am, and the diarrhea has not come back so far  She had chills but no fever  No cough No blood in her stool Patient Active Problem List   Diagnosis Date Noted   Melena 12/18/2020   History of colonic polyps 12/18/2020   Constipation 12/18/2020   Obesity    Iron deficiency anemia    Insulin resistance    Hypertension    Hyperlipidemia    Diabetes mellitus without complication ([place])    Depression    Cervical spondylosis    Anxiety    Pneumonia due to COVID-19 virus 08/21/2020   Hyperlipidemia associated with type 2 diabetes mellitus ([place]) 08/21/2020   Uncontrolled type 2 diabetes mellitus with complication, with long-term current use of insulin ([place]) 03/14/2020   Type 2 diabetes mellitus with hyperglycemia, with long-term current use of insulin ([place]) 12/10/2019   Type 2 diabetes mellitus with diabetic polyneuropathy, with long-term current use of insulin ([place]) 12/10/2019   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   Tinnitus of both ears 11/18/2019   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   Eustachian tube dysfunction, bilateral 11/18/2019   Chronic eczematous otitis externa of both ears 11/18/2019   Obesity (BMI 30-39.9) 08/24/2018   Depression with anxiety 08/24/2018   CAD (coronary artery disease)  08/24/2018   Abnormal cardiovascular stress test 08/24/2018   Lobar pneumonia, unspecified organism ([place]) 11/13/2017   Precordial chest pain    Sepsis ([place]) 09/18/2017   Chronic diastolic CHF (congestive heart failure) ([place]) 09/18/2017   Diabetes mellitus ([place]) 09/08/2017   Left hip pain 10/28/2016   Chronic rhinitis 08/07/2016   COPD with acute exacerbation ([place]) 04/25/2016   Hypertension associated with diabetes ([place]) 01/30/2016   Hypokalemia 01/27/2016   HLD (hyperlipidemia)    Type 2 diabetes mellitus without  complication, without long-term current use of insulin (HCC)    OSA (obstructive sleep apnea) 05/06/2014   Dyspnea on exertion 09/02/2012   Chest pain 12/10/2011   Chronic cough 05/11/2010   GERD 06/28/2008   Coronary atherosclerosis 06/28/2008    Past Medical History:  Diagnosis Date   Anxiety    Prior suicide attempt   CAD (coronary artery disease)    a) s/p DES to LAD 07/2005 b) Last Myoview low risk 11/2011 showing small fixed apical perfusion defect (prior MI vs attenuation) but no ischemia - normal EF.   Cervical spondylosis    Chest pain 12/10/2011   Chronic diastolic CHF (congestive heart failure) (HCC) 09/18/2017   Chronic eczematous otitis externa of both ears 11/18/2019   Chronic rhinitis 08/07/2016   Constipation 12/18/2020   COPD with acute exacerbation ([place]) 04/25/2016   Coronary atherosclerosis 06/28/2008   Depression    Depression with anxiety    Diabetes mellitus ([place]) 09/08/2017   Diabetes mellitus without complication ([place])    Dyspnea on exertion 09/02/2012   CXR 07/2012:  No acute process.     Eustachian tube dysfunction, bilateral 11/18/2019   GERD 06/28/2008   GERD (gastroesophageal reflux disease)    History of colonic polyps 12/18/2020   HLD (hyperlipidemia)    Hyperlipidemia    Hyperlipidemia associated with type 2 diabetes mellitus ([place]) 08/21/2020   Hypertension    Hypertension associated with diabetes ([place]) 01/30/2016   Hypokalemia 01/27/2016   Insulin resistance    Iron deficiency anemia    Lobar pneumonia, unspecified organism ([place]) 11/13/2017   Melena 12/18/2020   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   Obesity    Obesity (BMI 30-39.9) 08/24/2018   Last Assessment & Plan:  Exercise and weight reduction is encouraged.   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 EF 60%   CARDIAC CATHETERIZATION N/A 01/29/2016   Procedure: Left Heart Cath and Coronary Angiography;  Surgeon: Sherren Mocha, MD;  Location: [facility];  Service: Cardiovascular;  Laterality: N/A;   CERVICAL SPONDYLOSIS     SINGLE LEVEL FUSION   CHILDBIRTH     X3   CORONARY STENT PLACEMENT  07/2005   LEFT ANTERIOR DESCENDING   FOREARM FRACTURE SURGERY  2010   hand and shoulder    INCISION AND DRAINAGE BREAST ABSCESS  01/05/2012  INCISION AND DRAINAGE PERIRECTAL ABSCESS N/A 02/18/2014   Procedure: IRRIGATION AND DEBRIDEMENT PERIRECTAL ABSCESS;  Surgeon: Pedro Earls, MD;  Location: [facility];  Service: General;  Laterality: N/A;   LEFT HEART CATH AND CORONARY ANGIOGRAPHY N/A 10/10/2017   Procedure: LEFT HEART CATH AND CORONARY ANGIOGRAPHY;  Surgeon: Burnell Blanks, MD;  Location: [facility];  Service: Cardiovascular;  Laterality: N/A;   LUMBAR LAMINECTOMY     ROTATOR CUFF REPAIR     bilaterla   TONSILLECTOMY AND ADENOIDECTOMY     TUBAL LIGATION     VIDEO BRONCHOSCOPY Bilateral 08/13/2016   Procedure: VIDEO BRONCHOSCOPY WITHOUT FLUORO;  Surgeon: Collene Gobble, MD;  Location: [facility];  Service: Cardiopulmonary;  Laterality: Bilateral;    Social History   Tobacco Use   Smoking status: Never    Passive exposure: Yes   Smokeless tobacco: Never  Vaping Use   Vaping Use: Never used  Substance Use Topics   Alcohol use: Yes    Comment: occ   Drug use: No    Family History  Problem Relation Age of  Onset   Heart attack Mother    Diabetes Mother    Lung cancer Mother    Asthma Mother    Heart disease Mother    Suicidality Father        "killed himself"   Asthma Daughter        x 2   Cancer Daughter        pre-cancerous polyp   Diabetes Sister    Cancer Sister    Cervical cancer Daughter        cervical    Allergies Other        all family--seasonal allergies    Allergies  Allergen Reactions   Prednisone Other (See Comments)    REACTION: mood swings, nightmares. "Shot doesn't bother me, reaction is just with the pill" she states she has had the steroid injections before. From our records methylprednisone was given to her in 2013 without any complications.    Medication list has been reviewed and updated.  Current Outpatient Medications on File Prior to Visit  Medication Sig Dispense Refill   albuterol (VENTOLIN HFA) 108 (90 Base) MCG/ACT inhaler INHALE 2 PUFFS INTO THE LUNGS EVERY EIGHT HOURS AS NEEDED FOR WHEEZING OR SHORTNESS OF BREATH. 18 g 0   aspirin EC 81 MG tablet Take 81 mg by mouth daily.     calcium carbonate (TUMS EX) 750 MG chewable tablet Chew 1 tablet by mouth daily as needed for heartburn.     Cholecalciferol (VITAMIN D3) 1000 UNITS CAPS Take 1,000 Units by mouth daily.     dapagliflozin propanediol (FARXIGA) 10 MG TABS tablet Take 1 tablet (10 mg total) by mouth daily. 90 tablet 3   Dulaglutide (TRULICITY) 1.5 NT/7.0YF SOPN Inject 1.5 mg into the skin once a week. 6 mL 2   Fexofenadine HCl (ALLEGRA ALLERGY PO) Take 1 tablet by mouth daily.     FLUoxetine (PROZAC) 40 MG capsule TAKE 1 CAPSULE BY MOUTH EVERY DAY 90 capsule 3   fluticasone (FLONASE) 50 MCG/ACT nasal spray Place 2 sprays into both nostrils as needed for allergies or rhinitis.     furosemide (LASIX) 20 MG tablet TAKE 1 TABLET BY MOUTH EVERY DAY 90 tablet 2   gabapentin (NEURONTIN) 300 MG capsule Take 2 capsules (600 mg total) by mouth 2 (two) times daily. 360 capsule 1   hydrALAZINE  (APRESOLINE) 25 MG tablet Take 25 mg by  mouth 2 (two) times daily.     Insulin Glargine (BASAGLAR KWIKPEN) 100 UNIT/ML Inject 30 Units into the skin daily. 30 mL 3   Insulin Pen Needle 32G X 4 MM MISC 1 Device by Does not apply route daily. 100 each 3   magnesium oxide (MAG-OX) 400 MG tablet Take 400 mg by mouth daily.     metFORMIN (GLUCOPHAGE-XR) 500 MG 24 hr tablet Take 2 tablets (1,000 mg total) by mouth 2 (two) times daily. 360 tablet 3   metoprolol succinate (TOPROL-XL) 100 MG 24 hr tablet Take 1 tablet (100 mg total) by mouth daily. 90 tablet 3   montelukast (SINGULAIR) 10 MG tablet Take 1 tablet (10 mg total) by mouth at bedtime. 90 tablet 1   nitroGLYCERIN (NITROSTAT) 0.4 MG SL tablet Place 1 tablet (0.4 mg total) under the tongue every 5 (five) minutes as needed for chest pain. 20 tablet 3   Potassium 99 MG TABS Take 1 tablet by mouth daily.     rosuvastatin (CRESTOR) 20 MG tablet TAKE 1 TABLET BY MOUTH EVERY DAY 90 tablet 3   Tiotropium Bromide Monohydrate (SPIRIVA RESPIMAT) 2.5 MCG/ACT AERS Inhale 2 puffs into the lungs daily. 4 g 3   traZODone (DESYREL) 50 MG tablet TAKE 1/2 TO 1 TABLET BY MOUTH AT BEDTIME AS NEEDED FOR SLEEP 90 tablet 1   valsartan (DIOVAN) 160 MG tablet Take 1 tablet (160 mg total) by mouth daily. 90 tablet 3   zinc gluconate 50 MG tablet Take 50 mg by mouth daily.     hydrOXYzine (ATARAX/VISTARIL) 25 MG tablet TAKE 1/2 TO 1 TABLET BY MOUTH EVERY 8 HOURS AS NEEDED FOR ITCHING 270 tablet 3   No current facility-administered medications on file prior to visit.    Review of Systems:  As per HPI- otherwise negative.  Physical Examination: Vitals:   02/09/21 1547  BP: (!) 144/70  Pulse: 92  Temp: 98.2 F (36.8 C)  SpO2: 91%   Vitals:   02/09/21 1547  Weight: 176 lb 3.2 oz (79.9 kg)  Height: 4' 10"  (1.473 m)   Body mass index is 36.83 kg/m. Ideal Body Weight: Weight in (lb) to have BMI = 25: 119.4  GEN: no acute distress.  Obese, otherwise looks  well HEENT: Atraumatic, Normocephalic.  Ears and Nose: No external deformity. CV: RRR, No M/G/R. No JVD. No thrill. No extra heart sounds. PULM: CTA B, no wheezes, crackles, rhonchi. No retractions. No resp. distress. No accessory muscle use. ABD: S, ND, +BS. No rebound. No HSM.  She has mild generalized tenderness of her abdomen, belly is not acute and there is no localization of pain EXTR: No c/c/e PSYCH: Normally interactive. Conversant.    Assessment and Plan: Diarrhea, unspecified type - Plan: CBC, Comprehensive metabolic panel, ondansetron (ZOFRAN ODT) 4 MG disintegrating tablet  Nausea without vomiting - Plan: ondansetron (ZOFRAN ODT) 4 MG disintegrating tablet Patient following up today with concern of diarrhea for almost a week.  Thankfully has now resolved.  She would like to get some lab work to see if we can determine any cause which I am glad to do.  I suggested that she try Imodium over-the-counter as needed for diarrhea symptoms, can also use Zofran for nausea  She is asked to seek care right away if she is getting worse again, or if any new symptoms This visit occurred during the SARS-CoV-2 public health emergency.  Safety protocols were in place, including screening questions prior to the visit, additional usage of  staff PPE, and extensive cleaning of exam room while observing appropriate contact time as indicated for disinfecting solutions.   Signed Lamar Blinks, MD  Received her labs 8/2- message to pt  Results for orders placed or performed in visit on 02/09/21  CBC  Result Value Ref Range   WBC 10.5 3.4 - 10.8 x10E3/uL   RBC 4.79 3.77 - 5.28 x10E6/uL   Hemoglobin 12.0 11.1 - 15.9 g/dL   Hematocrit 39.9 34.0 - 46.6 %   MCV 83 79 - 97 fL   MCH 25.1 (L) 26.6 - 33.0 pg   MCHC 30.1 (L) 31.5 - 35.7 g/dL   RDW 14.8 11.7 - 15.4 %   Platelets 265 150 - 450 x10E3/uL  Comprehensive metabolic panel  Result Value Ref Range   Glucose 106 (H) 65 - 99 mg/dL   BUN 13 8 -  27 mg/dL   Creatinine, Ser 0.53 (L) 0.57 - 1.00 mg/dL   eGFR 99 >59 mL/min/1.73   BUN/Creatinine Ratio 25 12 - 28   Sodium 143 134 - 144 mmol/L   Potassium 4.4 3.5 - 5.2 mmol/L   Chloride 105 96 - 106 mmol/L   CO2 25 20 - 29 mmol/L   Calcium 9.6 8.7 - 10.3 mg/dL   Total Protein 6.3 6.0 - 8.5 g/dL   Albumin 3.9 3.7 - 4.7 g/dL   Globulin, Total 2.4 1.5 - 4.5 g/dL   Albumin/Globulin Ratio 1.6 1.2 - 2.2   Bilirubin Total <0.2 0.0 - 1.2 mg/dL   Alkaline Phosphatase 82 44 - 121 IU/L   AST 14 0 - 40 IU/L   ALT 11 0 - 32 IU/L

## 2021-02-08 NOTE — Telephone Encounter (Signed)
  Care Management   Follow Up Note   02/08/2021 Name: Destiny Ballard MRN: [id number] DOB: October 27, 1950   Referred by: Darreld Mclean, MD Reason for referral : Chronic Care Management (RNCM follow up)   An unsuccessful telephone outreach was attempted today. The patient was referred to the case management team for assistance with care management and care coordination.   Follow Up Plan: The care management team will reach out to the patient again over the next 30 days.   Thea Silversmith, RN, MSN, BSN, CCM Care Management Coordinator [facility] [phone]

## 2021-02-09 ENCOUNTER — Ambulatory Visit (INDEPENDENT_AMBULATORY_CARE_PROVIDER_SITE_OTHER): Payer: Medicare Other | Admitting: Family Medicine

## 2021-02-09 ENCOUNTER — Other Ambulatory Visit: Payer: Self-pay

## 2021-02-09 ENCOUNTER — Telehealth: Payer: Self-pay | Admitting: *Deleted

## 2021-02-09 ENCOUNTER — Encounter: Payer: Self-pay | Admitting: Family Medicine

## 2021-02-09 VITALS — BP 144/70 | HR 95 | Temp 98.2°F | Ht <= 58 in | Wt 176.2 lb

## 2021-02-09 DIAGNOSIS — I25118 Atherosclerotic heart disease of native coronary artery with other forms of angina pectoris: Secondary | ICD-10-CM | POA: Diagnosis not present

## 2021-02-09 DIAGNOSIS — R110 Nausea: Secondary | ICD-10-CM | POA: Diagnosis not present

## 2021-02-09 DIAGNOSIS — R197 Diarrhea, unspecified: Secondary | ICD-10-CM | POA: Diagnosis not present

## 2021-02-09 MED ORDER — ONDANSETRON 4 MG PO TBDP: 4.0000 mg | ORAL_TABLET | Freq: Three times a day (TID) | ORAL | 0 refills | Status: DC | PRN

## 2021-02-09 NOTE — Chronic Care Management (AMB) (Signed)
  Care Management   Note  02/09/2021 Name: JENNIFERLEE TREMONT MRN: [id number] DOB: [birth year]  Destiny Ballard is a 70 y.o. year old female who is a primary care patient of Copland, Gay Filler, MD and is actively engaged with the care management team. I reached out to [name] by phone today to assist with re-scheduling a follow up visit with the RN Case Manager  Follow up plan: Unsuccessful telephone outreach attempt made. A HIPAA compliant phone message was left for the patient providing contact information and requesting a return call.   Julian Hy, [place] Management  Direct Dial: [phone]

## 2021-02-09 NOTE — Patient Instructions (Signed)
Good to see you again today- I am sorry you had this diarrhea!  We will check your blood for any explanation In the meantime please use imodium OTC as needed for diarrhea- you can also use the zofran rx if needed for nausea  Let me know if you are getting worse again

## 2021-02-12 NOTE — Chronic Care Management (AMB) (Signed)
  Care Management   Note  02/12/2021 Name: Destiny Ballard MRN: [id number] DOB: Oct 18, 1950  Destiny Ballard is a 70 y.o. year old female who is a primary care patient of Copland, Gay Filler, MD and is actively engaged with the care management team. I reached out to [name] by phone today to assist with re-scheduling a follow up visit with the RN Case Manager  Follow up plan: 2nd Unsuccessful telephone outreach attempt made. A HIPAA compliant phone message was left for the patient providing contact information and requesting a return call.   Julian Hy, [place] Management  Direct Dial: [phone]

## 2021-02-13 ENCOUNTER — Encounter: Payer: Self-pay | Admitting: Family Medicine

## 2021-02-13 LAB — COMPREHENSIVE METABOLIC PANEL
ALT: 11 IU/L (ref 0–32)
AST: 14 IU/L (ref 0–40)
Albumin/Globulin Ratio: 1.6 (ref 1.2–2.2)
Albumin: 3.9 g/dL (ref 3.7–4.7)
Alkaline Phosphatase: 82 IU/L (ref 44–121)
BUN/Creatinine Ratio: 25 (ref 12–28)
BUN: 13 mg/dL (ref 8–27)
Bilirubin Total: <0.2 mg/dL (ref 0.0–1.2)
CO2: 25 mmol/L (ref 20–29)
Calcium: 9.6 mg/dL (ref 8.7–10.3)
Chloride: 105 mmol/L (ref 96–106)
Creatinine, Ser: 0.53 mg/dL — ABNORMAL LOW (ref 0.57–1.00)
Globulin, Total: 2.4 g/dL (ref 1.5–4.5)
Glucose: 106 mg/dL — ABNORMAL HIGH (ref 65–99)
Potassium: 4.4 mmol/L (ref 3.5–5.2)
Sodium: 143 mmol/L (ref 134–144)
Total Protein: 6.3 g/dL (ref 6.0–8.5)
eGFR: 99 mL/min/{1.73_m2} (ref >59)

## 2021-02-13 LAB — CBC
Hematocrit: 39.9 % (ref 34.0–46.6)
Hemoglobin: 12 g/dL (ref 11.1–15.9)
MCH: 25.1 pg — ABNORMAL LOW (ref 26.6–33.0)
MCHC: 30.1 g/dL — ABNORMAL LOW (ref 31.5–35.7)
MCV: 83 fL (ref 79–97)
Platelets: 265 10*3/uL (ref 150–450)
RBC: 4.79 x10E6/uL (ref 3.77–5.28)
RDW: 14.8 % (ref 11.7–15.4)
WBC: 10.5 10*3/uL (ref 3.4–10.8)

## 2021-02-14 DIAGNOSIS — Z20822 Contact with and (suspected) exposure to covid-19: Secondary | ICD-10-CM | POA: Diagnosis not present

## 2021-02-16 NOTE — Chronic Care Management (AMB) (Signed)
  Care Management   Note  02/16/2021 Name: Destiny Ballard MRN: [id number] DOB: 07/11/1951  Destiny Ballard is a 70 y.o. year old female who is a primary care patient of Copland, Gay Filler, MD and is actively engaged with the care management team. I reached out to [name] by phone today to assist with re-scheduling a follow up visit with the RN Case Manager  Follow up plan: Telephone appointment with care management team member scheduled for: 02/26/2021  Julian Hy, [place], [place] Management  Direct Dial: [phone]

## 2021-02-17 ENCOUNTER — Other Ambulatory Visit: Payer: Self-pay | Admitting: Family Medicine

## 2021-02-17 DIAGNOSIS — F32A Depression, unspecified: Secondary | ICD-10-CM

## 2021-02-17 DIAGNOSIS — E1142 Type 2 diabetes mellitus with diabetic polyneuropathy: Secondary | ICD-10-CM

## 2021-02-17 DIAGNOSIS — I10 Essential (primary) hypertension: Secondary | ICD-10-CM

## 2021-02-22 ENCOUNTER — Other Ambulatory Visit: Payer: Self-pay | Admitting: Family Medicine

## 2021-02-22 DIAGNOSIS — R059 Cough, unspecified: Secondary | ICD-10-CM

## 2021-02-26 ENCOUNTER — Ambulatory Visit (INDEPENDENT_AMBULATORY_CARE_PROVIDER_SITE_OTHER): Payer: Medicare Other

## 2021-02-26 DIAGNOSIS — E1165 Type 2 diabetes mellitus with hyperglycemia: Secondary | ICD-10-CM

## 2021-02-26 DIAGNOSIS — IMO0002 Reserved for concepts with insufficient information to code with codable children: Secondary | ICD-10-CM

## 2021-02-26 DIAGNOSIS — F418 Other specified anxiety disorders: Secondary | ICD-10-CM

## 2021-02-26 DIAGNOSIS — J449 Chronic obstructive pulmonary disease, unspecified: Secondary | ICD-10-CM

## 2021-02-26 NOTE — Patient Instructions (Addendum)
Visit Information  PATIENT GOALS:  Goals Addressed             This Visit's Progress    Monitor and Manage My Blood Sugar   On track    Timeframe:  Long-Range Goal Priority:  Medium Start Date:    08/30/20                        Expected End Date:    02/26/21                  Follow Up Date 03/29/21   Patient Goals/Self-Care Activities continue to check blood sugars per provider recommendation.  continue to take medications as prescribed  take the blood sugar log to your doctor visits notify provider for sustained elevations in blood sugar or Blood sugars less than 70. attend provider visits as scheduled and contact your provider with questions or concerns continue to work with embedded clinic pharmacist, [name], Pharm D for medication questions or concerns.  Continue to eat a healthy diet: lean protein, fruits, vegetables, low salt, low in fats. Monitor portion sizes and minimize processed foods. Personal goal: "decrease the amount of potato chips I eat". Increase activity per provider recommendations   Why is this important?   Checking your blood sugar at home helps to keep it from getting very high or very low.  Writing the results in a diary or log helps the doctor know how to care for you.  Your blood sugar log should have the time, date and the results.  Also, write down the amount of insulin or other medicine that you take.  Other information, like what you ate, exercise done and how you were feeling, will also be helpful.       Track and Manage my Respiratory Symptoms   On track    Timeframe:  Long-Range Goal Priority:  Medium Start Date:   08/30/20                        Expected End Date: 06/28/21                     Follow Up Date 03/29/21   Patient Goals:  keep all follow-up appointments.  Continue to follow the COPD action plan as needed. Continue to take your medications as prescribed. Continue to work with Clinic pharmacist regarding medications and  medication assistance forms Call your provider with and worsening signs/symptoms or concerns.  Review COPD action plan. Plan to discuss at next telephone call.     Why is this important?   Tracking your symptoms and other information about your health helps your doctor plan your care.  Write down the symptoms, the time of day, what you were doing and what medicine you are taking.  You will soon learn how to manage your symptoms.     Notes:       COPD Action Plan A COPD action plan is a description of what to do when you have a flare (exacerbation) of chronic obstructive pulmonary disease (COPD). Your action plan is a color-coded plan that lists the symptoms that indicate whether your condition is under control and what actions to take. If you have symptoms in the green zone, it means you are doing well that day. If you have symptoms in the yellow zone, it means you are having a bad day or an exacerbation. If you have symptoms in the  red zone, you need urgent medical care. Follow the plan that you and your health care provider developed. Review yourplan with your health care provider at each visit. Red zone Symptoms in this zone mean that you should get medical help right away. They include: Feeling very short of breath, even when you are resting. Not being able to do any activities because of poor breathing. Not being able to sleep because of poor breathing. Fever or shaking chills. Feeling confused or very sleepy. Chest pain. Coughing up blood. If you have any of these symptoms, call emergency services (911 in the [place] or go to the nearest emergency room. Yellow zone Symptoms in this zone mean that your condition may be getting worse. They include: Feeling more short of breath than usual. Having less energy for daily activities than usual. Phlegm or mucus that is thicker than usual. Needing to use your rescue inhaler or nebulizer more often than usual. More ankle swelling than  usual. Coughing more than usual. Feeling like you have a chest cold. Trouble sleeping due to COPD symptoms. Decreased appetite. COPD medicines not helping as much as usual. If you experience any "yellow" symptoms: Keep taking your daily medicines as directed. Use your quick-relief inhaler as told by your health care provider. If you were prescribed steroid medicine to take by mouth (oral medicine), start taking it as told by your health care provider. If you were prescribed an antibiotic medicine, start taking it as told by your health care provider. Do not stop taking the antibiotic even if you start to feel better. Use oxygen as told by your health care provider. Get more rest. Do your pursed-lip breathing exercises. Do not smoke. Avoid any irritants in the air. If your signs and symptoms do not improve after taking these steps, call yourhealth care provider right away. Green zone Symptoms in this zone mean that you are doing well. They include: Being able to do your usual activities and exercise. Having the usual amount of coughing, including the same amount of phlegm or mucus. Being able to sleep well. Having a good appetite. Where to find more information: You can find more information about COPD from: American Lung Association, My COPD Action Plan: www.lung.org COPD Foundation: www.copdfoundation.[place]: https://wilson-eaton.com/ Follow these instructions at home: Continue taking your daily medicines as told by your health care provider. Make sure you receive all the immunizations that your health care provider recommends, especially the pneumococcal and influenza vaccines. Wash your hands often with soap and water. Have family members wash their hands too. Regular hand washing can help prevent infections. Follow your usual exercise and diet plan. Avoid irritants in the air, such as smoke. Do not use any products that contain nicotine or tobacco.  These products include cigarettes, chewing tobacco, and vaping devices, such as e-cigarettes. If you need help quitting, ask your health care provider. Summary A COPD action plan tells you what to do when you have a flare (exacerbation) of chronic obstructive pulmonary disease (COPD). Follow each action plan for your symptoms. If you have any symptoms in the red zone, call emergency services (911 in the [place] or go to the nearest emergency room. This information is not intended to replace advice given to you by your health care provider. Make sure you discuss any questions you have with your healthcare provider. Document Revised: 05/09/2020 Document Reviewed: 05/09/2020 Elsevier Patient Education  2022 [employer].   Patient verbalizes understanding of instructions provided today and  agrees to view in [place].   Telephone follow up appointment with care management team member scheduled for:03/29/21 The patient has been provided with contact information for the care management team and has been advised to call with any health related questions or concerns.   Thea Silversmith, RN, MSN, BSN, CCM Care Management Coordinator [facility] [phone]

## 2021-02-26 NOTE — Chronic Care Management (AMB) (Signed)
Chronic Care Management   CCM RN Visit Note  02/26/2021 Name: Destiny Ballard MRN: [id number] DOB: 06-27-1951  Subjective: Destiny Ballard is a 70 y.o. year old female who is a primary care patient of Copland, Gay Filler, MD. The care management team was consulted for assistance with disease management and care coordination needs.    Engaged with patient by telephone for follow up visit in response to provider referral for case management and/or care coordination services.   Consent to Services:  The patient was given information about Chronic Care Management services, agreed to services, and gave verbal consent prior to initiation of services.  Please see initial visit note for detailed documentation.   Patient agreed to services and verbal consent obtained.   Assessment: Review of patient past medical history, allergies, medications, health status, including review of consultants reports, laboratory and other test data, was performed as part of comprehensive evaluation and provision of chronic care management services.   SDOH (Social Determinants of Health) assessments and interventions performed:  SDOH Interventions    Flowsheet Row Most Recent Value  SDOH Interventions   Depression Interventions/Treatment  Medication, Currently on Treatment  [declines LCSW referral at this time.]        CCM Care Plan  Allergies  Allergen Reactions   Prednisone Other (See Comments)    REACTION: mood swings, nightmares. "Shot doesn't bother me, reaction is just with the pill" she states she has had the steroid injections before. From our records methylprednisone was given to her in 2013 without any complications.    Outpatient Encounter Medications as of 02/26/2021  Medication Sig Note   albuterol (VENTOLIN HFA) 108 (90 Base) MCG/ACT inhaler INHALE 2 PUFFS INTO THE LUNGS EVERY EIGHT HOURS AS NEEDED FOR WHEEZING OR SHORTNESS OF BREATH.    aspirin EC 81 MG tablet Take 81 mg by mouth daily.     calcium carbonate (TUMS EX) 750 MG chewable tablet Chew 1 tablet by mouth daily as needed for heartburn.    Cholecalciferol (VITAMIN D3) 1000 UNITS CAPS Take 1,000 Units by mouth daily.    dapagliflozin propanediol (FARXIGA) 10 MG TABS tablet Take 1 tablet (10 mg total) by mouth daily.    Dulaglutide (TRULICITY) 1.5 CX/4.4YJ SOPN Inject 1.5 mg into the skin once a week.    Fexofenadine HCl (ALLEGRA ALLERGY PO) Take 1 tablet by mouth daily.    FLUoxetine (PROZAC) 40 MG capsule TAKE 1 CAPSULE BY MOUTH EVERY DAY    fluticasone (FLONASE) 50 MCG/ACT nasal spray Place 2 sprays into both nostrils as needed for allergies or rhinitis.    furosemide (LASIX) 20 MG tablet TAKE 1 TABLET BY MOUTH EVERY DAY    gabapentin (NEURONTIN) 300 MG capsule TAKE 2 CAPSULES BY MOUTH 2 TIMES DAILY.    hydrALAZINE (APRESOLINE) 25 MG tablet Take 25 mg by mouth 2 (two) times daily.    hydrOXYzine (ATARAX/VISTARIL) 25 MG tablet TAKE 1/2 TO 1 TABLET BY MOUTH EVERY 8 HOURS AS NEEDED FOR ITCHING    Insulin Glargine (BASAGLAR KWIKPEN) 100 UNIT/ML Inject 30 Units into the skin daily.    magnesium oxide (MAG-OX) 400 MG tablet Take 400 mg by mouth daily.    metFORMIN (GLUCOPHAGE-XR) 500 MG 24 hr tablet Take 2 tablets (1,000 mg total) by mouth 2 (two) times daily.    metoprolol succinate (TOPROL-XL) 100 MG 24 hr tablet Take 1 tablet (100 mg total) by mouth daily.    montelukast (SINGULAIR) 10 MG tablet Take 1 tablet (10 mg total)  by mouth at bedtime.    nitroGLYCERIN (NITROSTAT) 0.4 MG SL tablet Place 1 tablet (0.4 mg total) under the tongue every 5 (five) minutes as needed for chest pain.    ondansetron (ZOFRAN ODT) 4 MG disintegrating tablet Take 1 tablet (4 mg total) by mouth every 8 (eight) hours as needed for nausea or vomiting.    Potassium 99 MG TABS Take 1 tablet by mouth daily.    rosuvastatin (CRESTOR) 20 MG tablet TAKE 1 TABLET BY MOUTH EVERY DAY    Tiotropium Bromide Monohydrate (SPIRIVA RESPIMAT) 2.5 MCG/ACT AERS Inhale 2  puffs into the lungs daily. 02/26/2021: States she takes as needed.   traZODone (DESYREL) 50 MG tablet TAKE 1/2 TO 1 TABLET BY MOUTH AT BEDTIME AS NEEDED FOR SLEEP    valsartan (DIOVAN) 160 MG tablet Take 1 tablet (160 mg total) by mouth daily.    zinc gluconate 50 MG tablet Take 50 mg by mouth daily.    Insulin Pen Needle 32G X 4 MM MISC 1 Device by Does not apply route daily.    No facility-administered encounter medications on file as of 02/26/2021.    Patient Active Problem List   Diagnosis Date Noted   Melena 12/18/2020   History of colonic polyps 12/18/2020   Constipation 12/18/2020   Obesity    Iron deficiency anemia    Insulin resistance    Hypertension    Hyperlipidemia    Diabetes mellitus without complication ([place])    Depression    Cervical spondylosis    Anxiety    Pneumonia due to COVID-19 virus 08/21/2020   Hyperlipidemia associated with type 2 diabetes mellitus ([place]) 08/21/2020   Uncontrolled type 2 diabetes mellitus with complication, with long-term current use of insulin ([place]) 03/14/2020   Type 2 diabetes mellitus with hyperglycemia, with long-term current use of insulin ([place]) 12/10/2019   Type 2 diabetes mellitus with diabetic polyneuropathy, with long-term current use of insulin ([place]) 12/10/2019   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   Tinnitus of both ears 11/18/2019   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   Eustachian tube dysfunction, bilateral 11/18/2019   Chronic eczematous otitis externa of both ears 11/18/2019   Obesity (BMI 30-39.9) 08/24/2018   Depression with anxiety 08/24/2018   CAD (coronary artery disease) 08/24/2018   Abnormal cardiovascular stress test 08/24/2018   Lobar pneumonia, unspecified organism ([place]) 11/13/2017   Precordial chest pain    Sepsis ([place]) 09/18/2017   Chronic diastolic CHF (congestive heart failure) ([place]) 09/18/2017   Diabetes mellitus ([place]) 09/08/2017   Left hip pain 10/28/2016    Chronic rhinitis 08/07/2016   COPD with acute exacerbation ([place]) 04/25/2016   Hypertension associated with diabetes ([place]) 01/30/2016   Hypokalemia 01/27/2016   HLD (hyperlipidemia)    Type 2 diabetes mellitus without complication, without long-term current use of insulin ([facility])    OSA (obstructive sleep apnea) 05/06/2014   Dyspnea on exertion 09/02/2012   Chest pain 12/10/2011   Chronic cough 05/11/2010   GERD 06/28/2008   Coronary atherosclerosis 06/28/2008    Conditions to be addressed/monitored:HTN, HLD, COPD, DMII, and PHQ2 score of 6, PHQ9 score of 12. She denies thoughts of harm to herself.  Care Plan : COPD (Adult)  Updates made by Luretha Rued, RN since 02/26/2021 12:00 AM     Problem: Symptom Exacerbation (COPD) due to Covid pneumonia   Priority: Medium     Long-Range Goal: Symptom Exacerbation Prevented or Minimized   Start Date: 08/30/2020  Expected  End Date: 06/28/2021  This Visit's Progress: On track  Recent Progress: On track  Priority: Medium  Note:   Current Barriers:  Knowledge deficits related to long term plan for self management of COPD-hospitalization 08/21/20-08/23/20 due to Covid pneumonia. She denies shortness of breath at this time, but states occasional cough and states she has a bottle of Benzonate with refills(not on profile) that she plans to refill. Client also expresses that she does not consistently take Spiriva on a daily basis as prescribed. She expresses primary concern is the cough.  Case Manager Clinical Goal(s): patient will be able to verbalize understanding of COPD action plan and when to seek appropriate levels of medical care  Interventions:  Collaboration with Copland, Gay Filler, MD regarding development and update of comprehensive plan of care as evidenced by provider attestation and co-signature Inter-disciplinary care team collaboration (see longitudinal plan of care) Medications reviewed with client. Discussed Spiriva inhaler and RNCM  encouraged client to use Spiriva inhaler daily as prescribed. RNCM also encouraged client to have albuterol on hand for use for wheezing or SOB to help open airways as needed.  Reviewed upcoming appointments Encouraged client to continue to work with Clinic pharmacist and complete medication assistance forms-She reports she has not completed forms. Reinforced provider recommendation to obtain Covid -19 Vaccine. Client reports she received the Covid vaccine on 01/11/2021 and 02/01/2021. She reports she also took the pneumonia vaccine. She denies any difficulty associated with receiving the vaccines. Care Coordination with clinic pharmacist Bowers, Pharm D. Provided copy of COPD action plan RNCM encouraged client to continue to work with clinic Pharmacist, [name] regarding medication questions and medication assistance needs. Patient Goals/Self-Care Activities:  keep all follow-up appointments.  Continue to follow the COPD action plan as needed. Continue to take your medications as prescribed. Continue to work with Clinic pharmacist regarding medications and medication assistance forms Call your provider with and worsening signs/symptoms or concerns.  Review COPD action plan. Plan to discuss at next telephone call.  Follow Up Plan: Telephone follow up appointment with care management team member scheduled for: 03/29/21 The patient has been provided with contact information for the care management team and has been advised to call with any health related questions or concerns.      Care Plan : Diabetes Type 2 (Adult)  Updates made by Luretha Rued, RN since 02/26/2021 12:00 AM     Problem: Disease Progression (Diabetes, Type 2)   Priority: Medium     Long-Range Goal: Disease Progression Prevented or Minimized   Start Date: 08/30/2020  Expected End Date: 06/28/2021  This Visit's Progress: On track  Recent Progress: On track  Priority: Medium  Note:   Objective:  Lab Results   Component Value Date   HGBA1C 8.3 (H) 11/27/2020   Lab Results  Component Value Date   CREATININE 0.53 (L) 02/09/2021   CREATININE 0.61 11/18/2020   CREATININE 0.62 09/04/2020  Objective:  Last practice recorded BP readings:  BP Readings from Last 3 Encounters:  02/09/21 (!) 144/70  01/09/21 132/88  01/01/21 120/60  Most recent eGFR/CrCl:  Lab Results  Component Value Date   EGFR 99 02/09/2021  Current Barriers:  Knowledge Deficits related to long term self care management of Diabetes. Client also has history of cardiac disease, HLD. Last office visit with endocrinologist 01/09/21. A1C improving 8.3 on 11/27/20 down from 9.3 on 08/22/20. Goal less than 7.0. Medications: Farxigo; Basiglar; Trulicity; Metformin. Client reports latest blood sugar reading was 141 after  eating. She states her blood sugar readings have improved.  She reports she has not checked blood pressure lately, but states they are within normal range. Triglycerides 266 on 01/01/21 improved from 366 on 08/31/20.  Client expresses sadness related to upcoming birthday of her deceased husband (4 years). She also reports concern re: situational family events. She declines to discuss further. Client declines LCSW referral and declines to take contact number for [facility]. Client with questions regarding medication losartan(not on profile). However, Valsartan is on medication profile Medication adherence-not taking Basiglar or Spiriva as prescribed. PHQ2 6/ PHQ9 12 Case Manager Clinical Goal(s):  patient will demonstrate improved adherence to prescribed treatment plan for diabetes self care/management as evidenced by: scheduling an appointment with endocrinologist contact provider for new or worsened symptoms or questions. Interventions:  Collaboration with Copland, Gay Filler, MD regarding development and update of comprehensive plan of care as evidenced by provider attestation and co-signature Inter-disciplinary  care team collaboration (see longitudinal plan of care) Collaboration with [name], Pharm D regarding client medication questions. Medications reviewed. Reports she does not take Basiglar daily as prescribed. Encouraged client to continue checking blood sugar per provider recommendation Reviewed scheduled/upcoming provider appointments RNCM encouraged client to continue to work with clinic pharmacist, [name] regarding medications and medication assistance. Provided emotional support and reassurance regarding family situation. Verbalization of feelings encouraged-grief response related to the loss of her husband.   Discussed her coping strategies: she reports her faith, prayer, music. She also provided care to her grandchildren. Offered clinic LCSW referral and also informed of [facility] [facility] [place] as available resources for her.   Discussed plans with patient for ongoing care management follow up and provided patient with direct contact information for care management team Patient Goals/Self-Care Activities continue to check blood sugars per provider recommendation.  continue to take medications as prescribed  take the blood sugar log to your doctor visits notify provider for sustained elevations in blood sugar or Blood sugars less than 70. attend provider visits as scheduled and contact your provider with questions or concerns continue to work with embedded clinic pharmacist, [name], Pharm D for medication questions or concerns.  Continue to eat a healthy diet: lean protein, fruits, vegetables, low salt, low in fats. Monitor portion sizes and minimize processed foods. Personal goal: "decrease the amount of potato chips I eat". Increase activity per provider recommendations Follow Up Plan: Telephone follow up appointment with care management team member scheduled for: 03/29/21 The patient has been provided with contact information for the care  management team and has been advised to call with any health related questions or concerns.      Plan:The patient has been provided with contact information for the care management team and has been advised to call with any health related questions or concerns.  and The care management team will reach out to the patient again over the next 45 days.  Thea Silversmith, RN, MSN, BSN, CCM Care Management Coordinator [facility] [phone]

## 2021-02-27 ENCOUNTER — Other Ambulatory Visit: Payer: Self-pay

## 2021-02-27 ENCOUNTER — Telehealth (INDEPENDENT_AMBULATORY_CARE_PROVIDER_SITE_OTHER): Payer: Medicare Other | Admitting: Family Medicine

## 2021-02-27 ENCOUNTER — Encounter: Payer: Self-pay | Admitting: Family Medicine

## 2021-02-27 ENCOUNTER — Telehealth: Payer: Self-pay | Admitting: Pharmacist

## 2021-02-27 VITALS — Ht 59.0 in | Wt 175.0 lb

## 2021-02-27 DIAGNOSIS — F418 Other specified anxiety disorders: Secondary | ICD-10-CM

## 2021-02-27 DIAGNOSIS — I25118 Atherosclerotic heart disease of native coronary artery with other forms of angina pectoris: Secondary | ICD-10-CM

## 2021-02-27 MED ORDER — LORAZEPAM 0.5 MG PO TABS: 0.5000 mg | ORAL_TABLET | Freq: Two times a day (BID) | ORAL | 1 refills | Status: DC | PRN

## 2021-02-27 NOTE — Progress Notes (Addendum)
Subjective:   By signing my name below, I, Shehryar Baig, attest that this documentation has been prepared under the direction and in the presence of Dr. Roma Schanz, DO. 02/27/2021    Patient ID: Destiny Ballard, female    DOB: May 13, 1951, 70 y.o.   MRN: [id number]  Chief Complaint  Patient presents with   Anxiety    F/u - prozac medication concern- wondering if she can get immune to it since she has been on it for years      HPI Patient is in today for a video visit.  She complains of her anxiety levels increasing. She reports that her husband died 4 years ago around 22-Apr-2023 and that her anxiety increases every year the closer she gets to that date. She has tried counseling from her church to help manage her symptoms and found mild relief. She also notes that family events are causing her stress. She continues taking 40 mg Prozac daily PO and reports doing well on it. She notes she is on 40 mg Prozac for the past 10 years. She also reports having developed a headache and thinks it related to her anxiety.    Past Medical History:  Diagnosis Date   Anxiety    Prior suicide attempt   CAD (coronary artery disease)    a) s/p DES to LAD 07/2005 b) Last Myoview low risk 11/2011 showing small fixed apical perfusion defect (prior MI vs attenuation) but no ischemia - normal EF.   Cervical spondylosis    Chest pain 12/10/2011   Chronic diastolic CHF (congestive heart failure) (HCC) 09/18/2017   Chronic eczematous otitis externa of both ears 11/18/2019   Chronic rhinitis 08/07/2016   Constipation 12/18/2020   COPD with acute exacerbation ([place]) 04/25/2016   Coronary atherosclerosis 06/28/2008   Depression    Depression with anxiety    Diabetes mellitus ([place]) 09/08/2017   Diabetes mellitus without complication ([place])    Dyspnea on exertion 09/02/2012   CXR 07/2012:  No acute process.     Eustachian tube dysfunction, bilateral 11/18/2019   GERD 06/28/2008   GERD (gastroesophageal reflux  disease)    History of colonic polyps 12/18/2020   HLD (hyperlipidemia)    Hyperlipidemia    Hyperlipidemia associated with type 2 diabetes mellitus ([place]) 08/21/2020   Hypertension    Hypertension associated with diabetes ([place]) 01/30/2016   Hypokalemia 01/27/2016   Insulin resistance    Iron deficiency anemia    Lobar pneumonia, unspecified organism ([place]) 11/13/2017   Melena 12/18/2020   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   Obesity    Obesity (BMI 30-39.9) 08/24/2018   Last Assessment & Plan:  Exercise and weight reduction is encouraged.   OSA (obstructive sleep apnea) 05/06/2014   Pneumonia due to COVID-19 virus 08/21/2020   Precordial chest pain    Sepsis ([place]) 09/18/2017   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   Tinnitus of both ears 11/18/2019   Type 2 diabetes mellitus with diabetic polyneuropathy, with long-term current use of insulin ([place]) 12/10/2019   Type 2 diabetes mellitus with hyperglycemia, with long-term current use of insulin ([place]) 12/10/2019   Uncontrolled type 2 diabetes mellitus with complication, with long-term current use of insulin ([place]) 03/14/2020    Past Surgical History:  Procedure Laterality Date   BREAST ENHANCEMENT SURGERY     CARDIAC CATHETERIZATION  06/17/2007   NORMAL. EF 60%   CARDIAC CATHETERIZATION N/A 01/29/2016   Procedure: Left Heart Cath and Coronary Angiography;  Surgeon: Sherren Mocha, MD;  Location: [facility];  Service: Cardiovascular;  Laterality: N/A;   CERVICAL SPONDYLOSIS     SINGLE LEVEL FUSION   CHILDBIRTH     X3   CORONARY STENT PLACEMENT  07/2005   LEFT ANTERIOR DESCENDING   FOREARM FRACTURE SURGERY  2010   hand and shoulder    INCISION AND DRAINAGE BREAST ABSCESS  01/05/2012       INCISION AND DRAINAGE PERIRECTAL ABSCESS N/A 02/18/2014   Procedure: IRRIGATION AND DEBRIDEMENT PERIRECTAL ABSCESS;  Surgeon: Pedro Earls, MD;  Location: [facility];  Service: General;  Laterality: N/A;   LEFT HEART CATH  AND CORONARY ANGIOGRAPHY N/A 10/10/2017   Procedure: LEFT HEART CATH AND CORONARY ANGIOGRAPHY;  Surgeon: Burnell Blanks, MD;  Location: [facility];  Service: Cardiovascular;  Laterality: N/A;   LUMBAR LAMINECTOMY     ROTATOR CUFF REPAIR     bil[place]la   TONSILLECTOMY AND ADENOIDECTOMY     TUBAL LIGATION     VIDEO BRONCHOSCOPY Bilateral 08/13/2016   Procedure: VIDEO BRONCHOSCOPY WITHOUT FLUORO;  Surgeon: Collene Gobble, MD;  Location: [facility];  Service: Cardiopulmonary;  Laterality: Bilateral;    Family History  Problem Relation Age of Onset   Heart attack Mother    Diabetes Mother    Lung cancer Mother    Asthma Mother    Heart disease Mother    Suicidality Father        "killed himself"   Asthma Daughter        x 2   Cancer Daughter        pre-cancerous polyp   Diabetes Sister    Cancer Sister    Cervical cancer Daughter        cervical    Allergies Other        all family--seasonal allergies    Social History   Socioeconomic History   Marital status: Widowed    Spouse name: Lake Bells   Number of children: 3   Years of education: 12   Highest education level: Not on file  Occupational History   Occupation: retired from [employer]: 11/2012  Tobacco Use   Smoking status: Never    Passive exposure: Yes   Smokeless tobacco: Never  Vaping Use   Vaping Use: Never used  Substance and Sexual Activity   Alcohol use: Yes    Comment: occ   Drug use: No   Sexual activity: Not Currently    Partners: Male    Birth control/protection: None  Other Topics Concern   Not on file  Social History Narrative   Their eldest daughter lives upstairs.   Social Determinants of Health   Financial Resource Strain: Medium Risk   Difficulty of Paying Living Expenses: Somewhat hard  Food Insecurity: No Food Insecurity   Worried About Charity fundraiser in the Last Year: Never true   Ran Out of Food in the Last Year: Never true  Transportation Needs: No  Transportation Needs   Lack of Transportation (Medical): No   Lack of Transportation (Non-Medical): No  Physical Activity: Not on file  Stress: Not on file  Social Connections: Not on file  Intimate Partner Violence: Not on file    Outpatient Medications Prior to Visit  Medication Sig Dispense Refill   albuterol (VENTOLIN HFA) 108 (90 Base) MCG/ACT inhaler INHALE 2 PUFFS INTO THE LUNGS EVERY EIGHT HOURS AS NEEDED FOR WHEEZING OR SHORTNESS OF BREATH. 18 g 0  aspirin EC 81 MG tablet Take 81 mg by mouth daily.     calcium carbonate (TUMS EX) 750 MG chewable tablet Chew 1 tablet by mouth daily as needed for heartburn.     Cholecalciferol (VITAMIN D3) 1000 UNITS CAPS Take 1,000 Units by mouth daily.     dapagliflozin propanediol (FARXIGA) 10 MG TABS tablet Take 1 tablet (10 mg total) by mouth daily. 90 tablet 3   Dulaglutide (TRULICITY) 1.5 YD/7.4JO SOPN Inject 1.5 mg into the skin once a week. 6 mL 2   Fexofenadine HCl (ALLEGRA ALLERGY PO) Take 1 tablet by mouth daily.     FLUoxetine (PROZAC) 40 MG capsule TAKE 1 CAPSULE BY MOUTH EVERY DAY 90 capsule 1   fluticasone (FLONASE) 50 MCG/ACT nasal spray Place 2 sprays into both nostrils as needed for allergies or rhinitis.     furosemide (LASIX) 20 MG tablet TAKE 1 TABLET BY MOUTH EVERY DAY 90 tablet 2   gabapentin (NEURONTIN) 300 MG capsule TAKE 2 CAPSULES BY MOUTH 2 TIMES DAILY. 360 capsule 1   hydrALAZINE (APRESOLINE) 25 MG tablet Take 25 mg by mouth 2 (two) times daily.     hydrOXYzine (ATARAX/VISTARIL) 25 MG tablet TAKE 1/2 TO 1 TABLET BY MOUTH EVERY 8 HOURS AS NEEDED FOR ITCHING 270 tablet 3   Insulin Glargine (BASAGLAR KWIKPEN) 100 UNIT/ML Inject 30 Units into the skin daily. 30 mL 3   Insulin Pen Needle 32G X 4 MM MISC 1 Device by Does not apply route daily. 100 each 3   magnesium oxide (MAG-OX) 400 MG tablet Take 400 mg by mouth daily.     metFORMIN (GLUCOPHAGE-XR) 500 MG 24 hr tablet Take 2 tablets (1,000 mg total) by mouth 2 (two) times  daily. 360 tablet 3   metoprolol succinate (TOPROL-XL) 100 MG 24 hr tablet Take 1 tablet (100 mg total) by mouth daily. 90 tablet 1   montelukast (SINGULAIR) 10 MG tablet Take 1 tablet (10 mg total) by mouth at bedtime. 90 tablet 1   nitroGLYCERIN (NITROSTAT) 0.4 MG SL tablet Place 1 tablet (0.4 mg total) under the tongue every 5 (five) minutes as needed for chest pain. 20 tablet 3   ondansetron (ZOFRAN ODT) 4 MG disintegrating tablet Take 1 tablet (4 mg total) by mouth every 8 (eight) hours as needed for nausea or vomiting. 30 tablet 0   Potassium 99 MG TABS Take 1 tablet by mouth daily.     rosuvastatin (CRESTOR) 20 MG tablet TAKE 1 TABLET BY MOUTH EVERY DAY 90 tablet 3   Tiotropium Bromide Monohydrate (SPIRIVA RESPIMAT) 2.5 MCG/ACT AERS Inhale 2 puffs into the lungs daily. 4 g 3   traZODone (DESYREL) 50 MG tablet TAKE 1/2 TO 1 TABLET BY MOUTH AT BEDTIME AS NEEDED FOR SLEEP 90 tablet 1   valsartan (DIOVAN) 160 MG tablet Take 1 tablet (160 mg total) by mouth daily. 90 tablet 3   zinc gluconate 50 MG tablet Take 50 mg by mouth daily.     No facility-administered medications prior to visit.    Allergies  Allergen Reactions   Prednisone Other (See Comments)    REACTION: mood swings, nightmares. "Shot doesn't bother me, reaction is just with the pill" she states she has had the steroid injections before. From our records methylprednisone was given to her in 2013 without any complications.    Review of Systems  Constitutional:  Negative for chills, fever and malaise/fatigue.  HENT:  Negative for congestion and hearing loss.   Eyes:  Negative  for discharge.  Respiratory:  Negative for cough, sputum production and shortness of breath.   Cardiovascular:  Negative for chest pain, palpitations and leg swelling.  Gastrointestinal:  Negative for abdominal pain, blood in stool, constipation, diarrhea, heartburn, nausea and vomiting.  Genitourinary:  Negative for dysuria, frequency, hematuria and  urgency.  Musculoskeletal:  Negative for back pain, falls and myalgias.  Skin:  Negative for rash.  Neurological:  Negative for dizziness, sensory change, loss of consciousness, weakness and headaches.  Endo/Heme/Allergies:  Negative for environmental allergies. Does not bruise/bleed easily.  Psychiatric/Behavioral:  Negative for depression and suicidal ideas. The patient is nervous/anxious. The patient does not have insomnia.       Objective:    Physical Exam Vitals and nursing note reviewed.  Constitutional:      General: She is not in acute distress.    Appearance: Normal appearance. She is not ill-appearing.  Eyes:     Extraocular Movements: Extraocular movements intact.     Pupils: Pupils are equal, round, and reactive to light.  Pulmonary:     Effort: Pulmonary effort is normal.     Breath sounds: No rales.  Skin:    General: Skin is dry.  Neurological:     Mental Status: She is alert and oriented to person, place, and time.  Psychiatric:        Mood and Affect: Mood is anxious and depressed. Affect is not tearful.        Behavior: Behavior normal.        Judgment: Judgment normal.    Ht 4' 11"  (1.499 m)   Wt 175 lb (79.4 kg)   BMI 35.35 kg/m  Wt Readings from Last 3 Encounters:  02/27/21 175 lb (79.4 kg)  02/09/21 176 lb 3.2 oz (79.9 kg)  01/25/21 176 lb (79.8 kg)    Diabetic Foot Exam - Simple   No data filed    Lab Results  Component Value Date   WBC 10.5 02/09/2021   HGB 12.0 02/09/2021   HCT 39.9 02/09/2021   PLT 265 02/09/2021   GLUCOSE 106 (H) 02/09/2021   CHOL 130 01/01/2021   TRIG 266 (H) 01/01/2021   HDL 44 01/01/2021   LDLDIRECT 64.0 09/09/2019   LDLCALC 45 01/01/2021   ALT 11 02/09/2021   AST 14 02/09/2021   NA 143 02/09/2021   K 4.4 02/09/2021   CL 105 02/09/2021   CREATININE 0.53 (L) 02/09/2021   BUN 13 02/09/2021   CO2 25 02/09/2021   TSH 2.361 01/27/2016   INR 0.9 10/06/2017   HGBA1C 8.3 (H) 11/27/2020   MICROALBUR 7.8 (H)  01/05/2018    Lab Results  Component Value Date   TSH 2.361 01/27/2016   Lab Results  Component Value Date   WBC 10.5 02/09/2021   HGB 12.0 02/09/2021   HCT 39.9 02/09/2021   MCV 83 02/09/2021   PLT 265 02/09/2021   Lab Results  Component Value Date   NA 143 02/09/2021   K 4.4 02/09/2021   CO2 25 02/09/2021   GLUCOSE 106 (H) 02/09/2021   BUN 13 02/09/2021   CREATININE 0.53 (L) 02/09/2021   BILITOT <0.2 02/09/2021   ALKPHOS 82 02/09/2021   AST 14 02/09/2021   ALT 11 02/09/2021   PROT 6.3 02/09/2021   ALBUMIN 3.9 02/09/2021   CALCIUM 9.6 02/09/2021   ANIONGAP 11 11/18/2020   EGFR 99 02/09/2021   GFR 89.68 09/04/2020   Lab Results  Component Value Date   CHOL 130 01/01/2021  Lab Results  Component Value Date   HDL 44 01/01/2021   Lab Results  Component Value Date   LDLCALC 45 01/01/2021   Lab Results  Component Value Date   TRIG 266 (H) 01/01/2021   Lab Results  Component Value Date   CHOLHDL 3.0 01/01/2021   Lab Results  Component Value Date   HGBA1C 8.3 (H) 11/27/2020       Assessment & Plan:   Problem List Items Addressed This Visit       Unprioritized   Depression with anxiety - Primary    con't prozac Add lorazepam prn ----  F/u pcp in 2-3 weeks  Pt did not want to go to counseling       Relevant Medications   LORazepam (ATIVAN) 0.5 MG tablet     Meds ordered this encounter  Medications   LORazepam (ATIVAN) 0.5 MG tablet    Sig: Take 1 tablet (0.5 mg total) by mouth 2 (two) times daily as needed for anxiety.    Dispense:  30 tablet    Refill:  1    I, Dr. Roma Schanz, DO, personally preformed the services described in this documentation.  All medical record entries made by the scribe were at my direction and in my presence.  I have reviewed the chart and discharge instructions (if applicable) and agree that the record reflects my personal performance and is accurate and complete. 02/27/2021   I,Shehryar Baig,acting as  a scribe for Ann Held, DO.,have documented all relevant documentation on the behalf of Ann Held, DO,as directed by  Ann Held, DO while in the presence of Ann Held, DO.   Ann Held, DO

## 2021-02-27 NOTE — Assessment & Plan Note (Signed)
con't prozac Add lorazepam prn ----  F/u pcp in 2-3 weeks  Pt did not want to go to counseling

## 2021-02-27 NOTE — Telephone Encounter (Signed)
Received message from [place] with concerns regarding patient taking losartan versus valsartan.  She also has still been skipping Basaglar doses and reported not taking spiriva daily.  Called patient to change our phone visit for next wee 03/05/21 to an office visit. Patient will bring in all her medications with her to review.

## 2021-03-05 ENCOUNTER — Ambulatory Visit: Payer: Medicare Other

## 2021-03-08 DIAGNOSIS — H52202 Unspecified astigmatism, left eye: Secondary | ICD-10-CM | POA: Diagnosis not present

## 2021-03-08 DIAGNOSIS — H5212 Myopia, left eye: Secondary | ICD-10-CM | POA: Diagnosis not present

## 2021-03-08 DIAGNOSIS — E119 Type 2 diabetes mellitus without complications: Secondary | ICD-10-CM | POA: Diagnosis not present

## 2021-03-08 LAB — HM DIABETES EYE EXAM

## 2021-03-09 ENCOUNTER — Encounter: Payer: Self-pay | Admitting: Family Medicine

## 2021-03-11 NOTE — Progress Notes (Addendum)
[facility] at [facility] [place], [place], [place] [address] [id number] [phone]  Date:  03/12/2021   Name:  Destiny Ballard   DOB:  [birth year]   MRN:  [id number]  PCP:  Destiny Mclean, MD    Chief Complaint: Anxiety   History of Present Illness:  Destiny Ballard is a 70 y.o. very pleasant female patient who presents with the following:  Patient seen today for follow-up-history of CAD with cardiology care, hypertension, CHF, sleep apnea, COPD, diabetes, GERD, hyperlipidemia Most recent visit with myself was in July She actually saw my partner Dr. Etter Ballard on August 16 with concern of worsening anxiety-approaching the anniversary of her husband's death Dr. Etter Ballard had her continue current Prozac, prescribed lorazepam to use as needed She tried it just a couple of times   Mammogram is now due-reminded patient  She notes that she is under a bit less stress now- a family member was able to return back home (was staying locally with her daughter)- they do not get along that well  Having her out of the local area has decreased Destiny Ballard stress  Destiny Ballard is traveling to [place] later on this fall and has noted some ear discomfort - she wants to make sure all is ok here    Lab Results  Component Value Date   HGBA1C 8.3 (H) 11/27/2020     Patient Active Problem List   Diagnosis Date Noted   Melena 12/18/2020   History of colonic polyps 12/18/2020   Constipation 12/18/2020   Obesity    Iron deficiency anemia    Insulin resistance    Hypertension    Hyperlipidemia    Diabetes mellitus without complication ([place])    Depression    Cervical spondylosis    Anxiety    Pneumonia due to COVID-19 virus 08/21/2020   Hyperlipidemia associated with type 2 diabetes mellitus ([place]) 08/21/2020   Uncontrolled type 2 diabetes mellitus with complication, with long-term current use of insulin ([place]) 03/14/2020   Type 2 diabetes mellitus with  hyperglycemia, with long-term current use of insulin ([place]) 12/10/2019   Type 2 diabetes mellitus with diabetic polyneuropathy, with long-term current use of insulin ([place]) 12/10/2019   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   Tinnitus of both ears 11/18/2019   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   Eustachian tube dysfunction, bilateral 11/18/2019   Chronic eczematous otitis externa of both ears 11/18/2019   Obesity (BMI 30-39.9) 08/24/2018   Depression with anxiety 08/24/2018   CAD (coronary artery disease) 08/24/2018   Abnormal cardiovascular stress test 08/24/2018   Lobar pneumonia, unspecified organism ([place]) 11/13/2017   Precordial chest pain    Sepsis ([place]) 09/18/2017   Chronic diastolic CHF (congestive heart failure) ([place]) 09/18/2017   Diabetes mellitus ([place]) 09/08/2017   Left hip pain 10/28/2016   Chronic rhinitis 08/07/2016   COPD with acute exacerbation ([place]) 04/25/2016   Hypertension associated with diabetes ([place]) 01/30/2016   Hypokalemia 01/27/2016   HLD (hyperlipidemia)    Type 2 diabetes mellitus without complication, without long-term current use of insulin ([facility])    OSA (obstructive sleep apnea) 05/06/2014   Dyspnea on exertion 09/02/2012   Chest pain 12/10/2011   Chronic cough 05/11/2010   GERD 06/28/2008   Coronary atherosclerosis 06/28/2008    Past Medical History:  Diagnosis Date   Anxiety    Prior suicide attempt   CAD (coronary artery disease)    a) s/p  DES to LAD 07/2005 b) Last Myoview low risk 11/2011 showing small fixed apical perfusion defect (prior MI vs attenuation) but no ischemia - normal EF.   Cervical spondylosis    Chest pain 12/10/2011   Chronic diastolic CHF (congestive heart failure) (H[facility]) 09/18/2017   Chronic eczematous otitis externa of both ears 11/18/2019   Chronic rhinitis 08/07/2016   Constipation 12/18/2020   COPD with acute exacerbation ([place]) 04/25/2016   Coronary atherosclerosis 06/28/2008    Depression    Depression with anxiety    Diabetes mellitus ([place]) 09/08/2017   Diabetes mellitus without complication ([place])    Dyspnea on exertion 09/02/2012   CXR 07/2012:  No acute process.     Eustachian tube dysfunction, bilateral 11/18/2019   GERD 06/28/2008   GERD (gastroesophageal reflux disease)    History of colonic polyps 12/18/2020   HLD (hyperlipidemia)    Hyperlipidemia    Hyperlipidemia associated with type 2 diabetes mellitus ([place]) 08/21/2020   Hypertension    Hypertension associated with diabetes ([place]) 01/30/2016   Hypokalemia 01/27/2016   Insulin resistance    Iron deficiency anemia    Lobar pneumonia, unspecified organism ([place]) 11/13/2017   Melena 12/18/2020   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   Obesity    Obesity (BMI 30-39.9) 08/24/2018   Last Assessment & Plan:  Exercise and weight reduction is encouraged.   OSA (obstructive sleep apnea) 05/06/2014   Pneumonia due to COVID-19 virus 08/21/2020   Precordial chest pain    Sepsis ([place]) 09/18/2017   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   Tinnitus of both ears 11/18/2019   Type 2 diabetes mellitus with diabetic polyneuropathy, with long-term current use of insulin ([place]) 12/10/2019   Type 2 diabetes mellitus with hyperglycemia, with long-term current use of insulin ([place]) 12/10/2019   Uncontrolled type 2 diabetes mellitus with complication, with long-term current use of insulin ([place]) 03/14/2020    Past Surgical History:  Procedure Laterality Date   BREAST ENHANCEMENT SURGERY     CARDIAC CATHETERIZATION  06/17/2007   NORMAL. EF 60%   CARDIAC CATHETERIZATION N/A 01/29/2016   Procedure: Left Heart Cath and Coronary Angiography;  Surgeon: Destiny Mocha, MD;  Location: [facility];  Service: Cardiovascular;  Laterality: N/A;   CERVICAL SPONDYLOSIS     SINGLE LEVEL FUSION   CHILDBIRTH     X3   CORONARY STENT PLACEMENT  07/2005   LEFT ANTERIOR DESCENDING   FOREARM FRACTURE SURGERY  2010    hand and shoulder    INCISION AND DRAINAGE BREAST ABSCESS  01/05/2012       INCISION AND DRAINAGE PERIRECTAL ABSCESS N/A 02/18/2014   Procedure: IRRIGATION AND DEBRIDEMENT PERIRECTAL ABSCESS;  Surgeon: Pedro Earls, MD;  Location: [facility];  Service: General;  Laterality: N/A;   LEFT HEART CATH AND CORONARY ANGIOGRAPHY N/A 10/10/2017   Procedure: LEFT HEART CATH AND CORONARY ANGIOGRAPHY;  Surgeon: Burnell Blanks, MD;  Location: [facility];  Service: Cardiovascular;  Laterality: N/A;   LUMBAR LAMINECTOMY     ROTATOR CUFF REPAIR     [place]   TONSILLECTOMY AND ADENOIDECTOMY     TUBAL LIGATION     VIDEO BRONCHOSCOPY Bilateral 08/13/2016   Procedure: VIDEO BRONCHOSCOPY WITHOUT FLUORO;  Surgeon: Collene Gobble, MD;  Location: [facility];  Service: Cardiopulmonary;  Laterality: Bilateral;    Social History   Tobacco Use   Smoking status: Never    Passive exposure: Yes   Smokeless tobacco: Never  Vaping  Use   Vaping Use: Never used  Substance Use Topics   Alcohol use: Yes    Comment: occ   Drug use: No    Family History  Problem Relation Age of Onset   Heart attack Mother    Diabetes Mother    Lung cancer Mother    Asthma Mother    Heart disease Mother    Suicidality Father        "killed himself"   Asthma Daughter        x 2   Cancer Daughter        pre-cancerous polyp   Diabetes Sister    Cancer Sister    Cervical cancer Daughter        cervical    Allergies Other        all family--seasonal allergies    Allergies  Allergen Reactions   Prednisone Other (See Comments)    REACTION: mood swings, nightmares. "Shot doesn't bother me, reaction is just with the pill" she states she has had the steroid injections before. From our records methylprednisone was given to her in 2013 without any complications.    Medication list has been reviewed and updated.  Current Outpatient Medications on File Prior to Visit  Medication Sig Dispense Refill   albuterol  (VENTOLIN HFA) 108 (90 Base) MCG/ACT inhaler INHALE 2 PUFFS INTO THE LUNGS EVERY EIGHT HOURS AS NEEDED FOR WHEEZING OR SHORTNESS OF BREATH. 18 g 0   aspirin EC 81 MG tablet Take 81 mg by mouth daily.     calcium carbonate (TUMS EX) 750 MG chewable tablet Chew 1 tablet by mouth daily as needed for heartburn.     Cholecalciferol (VITAMIN D3) 1000 UNITS CAPS Take 1,000 Units by mouth daily.     dapagliflozin propanediol (FARXIGA) 10 MG TABS tablet Take 1 tablet (10 mg total) by mouth daily. 90 tablet 3   Dulaglutide (TRULICITY) 1.5 0000000 SOPN Inject 1.5 mg into the skin once a week. 6 mL 2   fexofenadine (ALLEGRA) 180 MG tablet Take 1 tablet by mouth daily.     FLUoxetine (PROZAC) 40 MG capsule TAKE 1 CAPSULE BY MOUTH EVERY DAY 90 capsule 1   fluticasone (FLONASE) 50 MCG/ACT nasal spray Place 2 sprays into both nostrils as needed for allergies or rhinitis.     furosemide (LASIX) 20 MG tablet TAKE 1 TABLET BY MOUTH EVERY DAY 90 tablet 2   gabapentin (NEURONTIN) 300 MG capsule TAKE 2 CAPSULES BY MOUTH 2 TIMES DAILY. 360 capsule 1   hydrALAZINE (APRESOLINE) 25 MG tablet Take 50 mg by mouth 2 (two) times daily.     hydrOXYzine (ATARAX/VISTARIL) 25 MG tablet TAKE 1/2 TO 1 TABLET BY MOUTH EVERY 8 HOURS AS NEEDED FOR ITCHING 270 tablet 3   Insulin Glargine (BASAGLAR KWIKPEN) 100 UNIT/ML Inject 30 Units into the skin daily. 30 mL 3   Insulin Pen Needle 32G X 4 MM MISC 1 Device by Does not apply route daily. 100 each 3   LORazepam (ATIVAN) 0.5 MG tablet Take 1 tablet (0.5 mg total) by mouth 2 (two) times daily as needed for anxiety. 30 tablet 1   magnesium oxide (MAG-OX) 400 MG tablet Take 400 mg by mouth daily.     metFORMIN (GLUCOPHAGE-XR) 500 MG 24 hr tablet Take 2 tablets (1,000 mg total) by mouth 2 (two) times daily. 360 tablet 3   metoprolol succinate (TOPROL-XL) 100 MG 24 hr tablet Take 1 tablet (100 mg total) by mouth daily. 90 tablet 1  montelukast (SINGULAIR) 10 MG tablet Take 1 tablet (10 mg  total) by mouth at bedtime. 90 tablet 1   nitroGLYCERIN (NITROSTAT) 0.4 MG SL tablet Place 1 tablet (0.4 mg total) under the tongue every 5 (five) minutes as needed for chest pain. 20 tablet 3   ondansetron (ZOFRAN ODT) 4 MG disintegrating tablet Take 1 tablet (4 mg total) by mouth every 8 (eight) hours as needed for nausea or vomiting. 30 tablet 0   Potassium 99 MG TABS Take 1 tablet by mouth daily.     rosuvastatin (CRESTOR) 20 MG tablet TAKE 1 TABLET BY MOUTH EVERY DAY 90 tablet 3   Tiotropium Bromide Monohydrate (SPIRIVA RESPIMAT) 2.5 MCG/ACT AERS Inhale 2 puffs into the lungs daily. 4 g 3   traZODone (DESYREL) 50 MG tablet TAKE 1/2 TO 1 TABLET BY MOUTH AT BEDTIME AS NEEDED FOR SLEEP 90 tablet 1   valsartan (DIOVAN) 160 MG tablet Take 1 tablet (160 mg total) by mouth daily. 90 tablet 3   zinc gluconate 50 MG tablet Take 50 mg by mouth daily.     No current facility-administered medications on file prior to visit.    Review of Systems:  As per HPI- otherwise negative.   Physical Examination: Vitals:   03/12/21 1258  BP: (!) 142/82  Pulse: 93  Resp: 18  Temp: (!) 97.5 F (36.4 C)  SpO2: 98%   Vitals:   03/12/21 1258  Weight: 178 lb (80.7 kg)  Height: '4\' 11"'$  (1.499 m)   Body mass index is 35.95 kg/m. Ideal Body Weight: Weight in (lb) to have BMI = 25: 123.5  GEN: no acute distress.  Obese, otherwise looks well and her normal self HEENT: Atraumatic, Normocephalic.  There is some earwax and dry skin in the left ear canal.  The right ear canal is normal Ears and Nose: No external deformity. CV: RRR, No M/G/R. No JVD. No thrill. No extra heart sounds. PULM: CTA B, no wheezes, crackles, rhonchi. No retractions. No resp. distress. No accessory muscle use. EXTR: No c/c/e PSYCH: Normally interactive. Conversant.   Left ear canal is irrigated until debris is removed.  Patient tolerated well with no complications Assessment and Plan: Depression with anxiety  Hyperlipidemia,  unspecified hyperlipidemia type  Type 2 diabetes mellitus without complication, without long-term current use of insulin ([place]) - Plan: Hemoglobin A1c  Immunization due - Plan: Flu vaccine HIGH DOSE PF (Fluzone High dose)  Hearing loss due to cerumen impaction, left  Following up today with a couple of concerns.  Her depression and anxiety are better with change in situation as described above.  She is continuing to take her fluoxetine, has been using lorazepam rarely -she will continue use this sparingly  History of poorly controlled diabetes, check A1c today  Flu shot given  Cerumen impaction of left ear is resolved This visit occurred during the SARS-CoV-2 public health emergency.  Safety protocols were in place, including screening questions prior to the visit, additional usage of staff PPE, and extensive cleaning of exam room while observing appropriate contact time as indicated for disinfecting solutions.   Signed Lamar Blinks, MD Received her A1c 9/2- called pt Making slow progress Taking her trulicity weekly 1.5 mg We decided to increase her trulicity to 3 mg at next refill  Recheck in 4 months   Meds ordered this encounter  Medications   Dulaglutide (TRULICITY) 3 0000000 SOPN    Sig: Inject 3 mg as directed once a week.    Dispense:  6 mL    Refill:  3     Results for orders placed or performed in visit on 03/12/21  Hemoglobin A1c  Result Value Ref Range   Hgb A1c MFr Bld 8.2 (H) 4.6 - 6.5 %

## 2021-03-12 ENCOUNTER — Other Ambulatory Visit: Payer: Self-pay

## 2021-03-12 ENCOUNTER — Ambulatory Visit (INDEPENDENT_AMBULATORY_CARE_PROVIDER_SITE_OTHER): Payer: Medicare Other | Admitting: Family Medicine

## 2021-03-12 ENCOUNTER — Ambulatory Visit: Payer: Medicare Other | Admitting: Pharmacist

## 2021-03-12 VITALS — BP 142/82 | HR 93 | Temp 97.5°F | Resp 18 | Ht 59.0 in | Wt 178.0 lb

## 2021-03-12 DIAGNOSIS — I25118 Atherosclerotic heart disease of native coronary artery with other forms of angina pectoris: Secondary | ICD-10-CM

## 2021-03-12 DIAGNOSIS — F418 Other specified anxiety disorders: Secondary | ICD-10-CM | POA: Diagnosis not present

## 2021-03-12 DIAGNOSIS — E119 Type 2 diabetes mellitus without complications: Secondary | ICD-10-CM | POA: Diagnosis not present

## 2021-03-12 DIAGNOSIS — H6122 Impacted cerumen, left ear: Secondary | ICD-10-CM | POA: Diagnosis not present

## 2021-03-12 DIAGNOSIS — E785 Hyperlipidemia, unspecified: Secondary | ICD-10-CM | POA: Diagnosis not present

## 2021-03-12 DIAGNOSIS — J449 Chronic obstructive pulmonary disease, unspecified: Secondary | ICD-10-CM

## 2021-03-12 DIAGNOSIS — Z23 Encounter for immunization: Secondary | ICD-10-CM | POA: Diagnosis not present

## 2021-03-12 NOTE — Patient Instructions (Signed)
It was good to see you again today- take care and let me know if you need anything!  I will be in touch with your A1c Please do schedule a mammogram soon Flu shot given today

## 2021-03-12 NOTE — Chronic Care Management (AMB) (Signed)
Chronic Care Management Pharmacy Note  03/12/2021 Name:  WANDALENE ABRAMS MRN:  [id number] DOB:  [birth year]  Subjective: Destiny Ballard is an 70 y.o. year old female who is a primary patient of Copland, Gay Filler, MD.  The CCM team was consulted for assistance with disease management and care coordination needs.    Engaged with patient face to face for follow up visit in response to provider referral for pharmacy case management and/or care coordination services.   Consent to Services:  The patient was given information about Chronic Care Management services, agreed to services, and gave verbal consent prior to initiation of services.  Please see initial visit note for detailed documentation.   Patient Care Team: Copland, Gay Filler, MD as PCP - General (Family Medicine) Brien Few, MD as Consulting Physician (Obstetrics and Gynecology) Luretha Rued, RN as Case Manager [name], PharmD (Pharmacist)  Recent office visits: 02/09/2021 - PCP (Dr Lorelei Pont) Diarrhea / nausea. Prescribed ondansetron 59m as needed 11/27/2020 - PCP (Dr CLorelei Pont F/U urgent care visit for headaches, hansues and numbness of left arm. No med changes; referred back to cardio  Recent consult visits: 01/09/2021 - Endo (Dr SKelton Pillar A1c improved. No med changes. Recommended tighter diet adherence.  01/01/2021 - Cardio (dr KAgustin Cree F/U CAD. No med changes. ECHO and Lexiscan ordered. Given nitroglycerin. Declined admission. F/U with cardio.  Hospital visits: 11/18/2020 - ED visit for precordial / chest pain  Objective:  Lab Results  Component Value Date   CREATININE 0.53 (L) 02/09/2021   CREATININE 0.61 11/18/2020   CREATININE 0.62 09/04/2020    Lab Results  Component Value Date   HGBA1C 8.3 (H) 11/27/2020   Last diabetic Eye exam:  Lab Results  Component Value Date/Time   HMDIABEYEEXA No Retinopathy 03/08/2021 12:00 AM    Last diabetic Foot exam: No results found for: HMDIABFOOTEX       Component Value Date/Time   CHOL 130 01/01/2021 1144   TRIG 266 (H) 01/01/2021 1144   HDL 44 01/01/2021 1144   CHOLHDL 3.0 01/01/2021 1144   CHOLHDL 3 09/09/2019 1128   VLDL 72.6 (H) 09/09/2019 1128   LDLCALC 45 01/01/2021 1144   LDLDIRECT 64.0 09/09/2019 1128    Hepatic Function Latest Ref Rng & Units 02/09/2021 11/18/2020 09/04/2020  Total Protein 6.0 - 8.5 g/dL 6.3 7.2 6.6  Albumin 3.7 - 4.7 g/dL 3.9 3.6 3.7  AST 0 - 40 IU/L _0 ALT 0 - 32 IU/L _1 Alk Phosphatase 44 - 121 IU/L 82 78 101  Total Bilirubin 0.0 - 1.2 mg/dL <0.2 0.3 0.6  Bilirubin, Direct 0.0 - 0.2 mg/dL - - -    Lab Results  Component Value Date/Time   TSH 2.361 01/27/2016 02:52 AM   TSH 0.83 03/24/2014 11:14 AM   TSH 0.88 09/10/2013 02:41 PM    CBC Latest Ref Rng & Units 02/09/2021 11/18/2020 09/04/2020  WBC 3.4 - 10.8 x10E3/uL 10.5 10.8(H) 9.0  Hemoglobin 11.1 - 15.9 g/dL 12.0 12.6 12.1  Hematocrit 34.0 - 46.6 % 39.9 39.6 37.6  Platelets 150 - 450 x10E3/uL 265 287 236.0    Lab Results  Component Value Date/Time   VD25OH 26 (L) 09/16/2011 10:58 AM    Clinical ASCVD: Yes  The 10-year ASCVD risk score (Mikey BussingDC Jr., et al., 2013) is: 28.3%   Values used to calculate the score:     Age: [age]years     Sex: Female  Is Non-Hispanic African American: No     Diabetic: Yes     Tobacco smoker: No     Systolic Blood Pressure: 433 mmHg     Is BP treated: Yes     HDL Cholesterol: 44 mg/dL     Total Cholesterol: 130 mg/dL    Social History   Tobacco Use  Smoking Status Never   Passive exposure: Yes  Smokeless Tobacco Never   BP Readings from Last 3 Encounters:  03/12/21 (!) 142/82  02/09/21 (!) 144/70  01/09/21 132/88   Pulse Readings from Last 3 Encounters:  03/12/21 93  02/09/21 95  01/09/21 75   Wt Readings from Last 3 Encounters:  03/12/21 178 lb (80.7 kg)  02/27/21 175 lb (79.4 kg)  02/09/21 176 lb 3.2 oz (79.9 kg)    Assessment: Review of patient past medical history,  allergies, medications, health status, including review of consultants reports, laboratory and other test data, was performed as part of comprehensive evaluation and provision of chronic care management services.   SDOH:  (Social Determinants of Health) assessments and interventions performed:  SDOH Interventions    Flowsheet Row Most Recent Value  SDOH Interventions   Physical Activity Interventions Other (Comments)  [Recommended trying to walk daily - start with 5 or 10 minutes and increase as able]       CCM Care Plan  Allergies  Allergen Reactions   Prednisone Other (See Comments)    REACTION: mood swings, nightmares. "Shot doesn't bother me, reaction is just with the pill" she states she has had the steroid injections before. From our records methylprednisone was given to her in 2013 without any complications.    Medications Reviewed Today     Reviewed by Kittie Plater, Lowell Guitar, [place] (Certified Medical Assistant) on 02/27/21 at [place] List Status: <None>   Medication Order Taking? Sig Documenting Provider Last Dose Status Informant  albuterol (VENTOLIN HFA) 108 (90 Base) MCG/ACT inhaler [id number] Yes INHALE 2 PUFFS INTO THE LUNGS EVERY EIGHT HOURS AS NEEDED FOR WHEEZING OR SHORTNESS OF BREATH. Jonetta Osgood, MD Taking Active   aspirin EC 81 MG tablet [id number] Yes Take 81 mg by mouth daily. [provider] Taking Active Self  calcium carbonate (TUMS EX) 750 MG chewable tablet [id number] Yes Chew 1 tablet by mouth daily as needed for heartburn. [provider] Taking Active   Cholecalciferol (VITAMIN D3) 1000 UNITS CAPS [id number] Yes Take 1,000 Units by mouth daily. [provider] Taking Active Self  dapagliflozin propanediol (FARXIGA) 10 MG TABS tablet [id number] Yes Take 1 tablet (10 mg total) by mouth daily. Shamleffer, Melanie Crazier, MD Taking Active Self  Dulaglutide (TRULICITY) 1.5 TD/1.7OH SOPN [id number] Yes Inject 1.5 mg into the skin once a  week. Shamleffer, Melanie Crazier, MD Taking Active   Fexofenadine HCl [facility] ALL[facility] PO) [id number] Yes Take 1 tablet by mouth daily. [provider] Taking Active Self  FLUoxetine (PROZAC) 40 MG capsule [id number] Yes TAKE 1 CAPSULE BY MOUTH EVERY DAY Copland, Gay Filler, MD Taking Active   fluticasone (FLONASE) 50 MCG/ACT nasal spray [id number] Yes Place 2 sprays into both nostrils as needed for allergies or rhinitis. [provider] Taking Active   furosemide (LASIX) 20 MG tablet [id number] Yes TAKE 1 TABLET BY MOUTH EVERY DAY Burtis Junes, NP Taking Active   gabapentin (NEURONTIN) 300 MG capsule [id number] Yes TAKE 2 CAPSULES BY MOUTH 2 TIMES DAILY. Copland, Gay Filler, MD Taking Active   hydrALAZINE (APRESOLINE) 25 MG tablet  [id number] Yes Take 25 mg by mouth 2 (two) times daily. [provider] Taking Active   hydrOXYzine (ATARAX/VISTARIL) 25 MG tablet [id number] Yes TAKE 1/2 TO 1 TABLET BY MOUTH EVERY 8 HOURS AS NEEDED FOR ITCHING Copland, Gay Filler, MD Taking Active            Med Note Alric Ran Jan 01, 2021 10:53 AM)    Insulin Glargine (BASAGLAR KWIKPEN) 100 UNIT/ML [id number] Yes Inject 30 Units into the skin daily. Shamleffer, Melanie Crazier, MD Taking Active   Insulin Pen Needle 32G X 4 MM MISC [id number] Yes 1 Device by Does not apply route daily. Shamleffer, Melanie Crazier, MD Taking Active   magnesium oxide (MAG-OX) 400 MG tablet [id number] Yes Take 400 mg by mouth daily. [provider] Taking Active Self  metFORMIN (GLUCOPHAGE-XR) 500 MG 24 hr tablet [id number] Yes Take 2 tablets (1,000 mg total) by mouth 2 (two) times daily. Copland, Gay Filler, MD Taking Active   metoprolol succinate (TOPROL-XL) 100 MG 24 hr tablet [id number] Yes Take 1 tablet (100 mg total) by mouth daily. Copland, Gay Filler, MD Taking Active   montelukast (SINGULAIR) 10 MG tablet [id number] Yes Take 1 tablet (10 mg total) by mouth at bedtime. Copland, Gay Filler, MD Taking  Active   nitroGLYCERIN (NITROSTAT) 0.4 MG SL tablet [id number] Yes Place 1 tablet (0.4 mg total) under the tongue every 5 (five) minutes as needed for chest pain. Copland, Gay Filler, MD Taking Active   ondansetron (ZOFRAN ODT) 4 MG disintegrating tablet [id number] Yes Take 1 tablet (4 mg total) by mouth every 8 (eight) hours as needed for nausea or vomiting. Copland, Gay Filler, MD Taking Active   Potassium 99 MG TABS [id number] Yes Take 1 tablet by mouth daily. [provider] Taking Active Self  rosuvastatin (CRESTOR) 20 MG tablet [id number] Yes TAKE 1 TABLET BY MOUTH EVERY DAY Copland, Gay Filler, MD Taking Active   Tiotropium Bromide Monohydrate (SPIRIVA RESPIMAT) 2.5 MCG/ACT AERS [id number] Yes Inhale 2 puffs into the lungs daily. Colon Branch, MD Taking Active            Med Note Anselm Lis Feb 26, 2021  2:27 PM) States she takes as needed.  traZODone (DESYREL) 50 MG tablet [id number] Yes TAKE 1/2 TO 1 TABLET BY MOUTH AT BEDTIME AS NEEDED FOR SLEEP Copland, Gay Filler, MD Taking Active Self  valsartan (DIOVAN) 160 MG tablet [id number] Yes Take 1 tablet (160 mg total) by mouth daily. Copland, Gay Filler, MD Taking Active   zinc gluconate 50 MG tablet [id number] Yes Take 50 mg by mouth daily. [provider] Taking Active Self            Patient Active Problem List   Diagnosis Date Noted   Melena 12/18/2020   History of colonic polyps 12/18/2020   Constipation 12/18/2020   Obesity    Iron deficiency anemia    Insulin resistance    Hypertension    Hyperlipidemia    Diabetes mellitus without complication ([place])    Depression    Cervical spondylosis    Anxiety    Pneumonia due to COVID-19 virus 08/21/2020   Hyperlipidemia associated with type 2 diabetes mellitus ([place]) 08/21/2020   Uncontrolled type 2 diabetes mellitus with complication, with long-term current use of insulin ([place]) 03/14/2020   Type 2 diabetes mellitus with hyperglycemia, with long-term current use  of insulin ([place]) 12/10/2019   Type 2 diabetes mellitus with diabetic polyneuropathy,  with long-term current use of insulin ([place]) 12/10/2019   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   Tinnitus of both ears 11/18/2019   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   Eustachian tube dysfunction, bilateral 11/18/2019   Chronic eczematous otitis externa of both ears 11/18/2019   Obesity (BMI 30-39.9) 08/24/2018   Depression with anxiety 08/24/2018   CAD (coronary artery disease) 08/24/2018   Abnormal cardiovascular stress test 08/24/2018   Lobar pneumonia, unspecified organism ([place]) 11/13/2017   Precordial chest pain    Sepsis ([employer]) 09/18/2017   Chronic diastolic CHF (congestive heart failure) ([place]) 09/18/2017   Diabetes mellitus ([place]) 09/08/2017   Left hip pain 10/28/2016   Chronic rhinitis 08/07/2016   COPD with acute exacerbation ([place]) 04/25/2016   Hypertension associated with diabetes ([place]) 01/30/2016   Hypokalemia 01/27/2016   HLD (hyperlipidemia)    Type 2 diabetes mellitus without complication, without long-term current use of insulin (H[facility])    OSA (obstructive sleep apnea) 05/06/2014   Dyspnea on exertion 09/02/2012   Chest pain 12/10/2011   Chronic cough 05/11/2010   GERD 06/28/2008   Coronary atherosclerosis 06/28/2008    Immunization History  Administered Date(s) Administered   Fluad Quad(high Dose 65+) 09/04/2020   Influenza Whole 04/14/2010   Influenza, High Dose Seasonal PF 04/03/2017, 05/11/2018, 03/05/2019   Influenza,inj,Quad PF,6+ Mos 05/10/2013, 04/05/2014, 05/22/2015, 04/25/2016   PFIZER Comirnaty([place])Covid-19 Tri-Sucrose Vaccine 01/11/2021, 02/01/2021   Pneumococcal Conjugate-13 07/24/2016   Pneumococcal Polysaccharide-23 04/05/2014, 05/22/2015   Tdap 07/11/2014, 03/05/2019   Zoster Recombinat (Shingrix) 03/05/2019, 06/05/2019    Conditions to be addressed/monitored: CAD, HTN, HLD, COPD, DMII, and Depression  Care  Plan : General Pharmacy (Adult)  Updates made by [name], PHARMD since 03/12/2021 12:00 AM     Problem: Medication Adherence (Wellness)      Long-Range Goal: Medication Adherence Maintained   Recent Progress: On track  Note:   Current Barriers:  Unable to independently afford treatment regimen Unable to achieve control of type 2 DM, HTN   Pharmacist Clinical Goal(s):  Over the next 90 days, patient will achieve adherence to monitoring guidelines and medication adherence to achieve therapeutic efficacy achieve control of diabetes and HTN as evidenced by meeting goal as described in patient's care plan,  through collaboration with PharmD and provider.   Interventions: 1:1 collaboration with Copland, Gay Filler, MD regarding development and update of comprehensive plan of care as evidenced by provider attestation and co-signature Inter-disciplinary care team collaboration (see longitudinal plan of care) Comprehensive medication review performed; medication list updated in electronic medical record  Diabetes: Uncontrolled but improving; A1c decreased from 9.3% to 8.3%; Goal A1c is < 7.0% Patient will see PCP today and have A1c rechecked Current treatment: Farxiga 6m daily Basaglar 30 units daily Trulicity 11.6RCweekly  metformin ER 5047m- take 2 tablets twice a day Patient reports she will occasionally skip Basaglar dose due to concern that BG will drop too low. She reports if BG is 90 to 120 in morning she will skip Basaglar.  She states she is taking all other meds as prescribed but pharmacy confirmed she has not filled FaIrann 2022 yet. She had a bottle with about 11 tablets of Farxiga with her today. I could not tell when she last filled because she uses the same bottles over and over because they have her "notes" on them  Current glucose readings: 114 to 150 fasting Denies hypoglycemic/hyperglycemic symptoms Interventions:  Educated on importance of taking all  medication  for diabetes as prescribed (A1c is improved - could be at goal with improved adhernece)  Education provided about long acting insulin and risk of hypoglycemia. Recommended that if she is concerned about BG dropping too low to lower dose of Basaglar by half instead of skipping altogether.  Reviewed signs and symptoms of hypoglycemia and how to treat.  If A1c is not at goal in future, could increase dose of Trulicity to 3 or 5.3ZJ weekly   Hypertension: Controlled; BP goal 140/90 Current treatment:  hydralazine 75m  - take 2 tablets = 576mtwice a day furosemide 2061maily (not taking) Valsartan 160m28mily  metoprolol succinate 100mg8mly. Current home readings: patient did not have numbers to report Verified that she has stopped losartan. She understands that valsartan replaced losartan Denied edema or SOB.  Interventions:  Reviewed BP goals and improtance of obtaining BP goals to decrease risk of stroke and renal disease.  Will let Dr CoplaLorelei Pont patient is no taking furosemide.   COPD: Controlled; Goal: prevent exacerbations and hospitalizations     Current treatment:  Spiriva 2.5mg -61minhalation once a day (not taking)  Albuterol inhaler as needed for shortness of breath and wheezing;  montelukast 10mg d55m at bedtime;  one exacerbation requiring treatment in the last 6 months (related to COVID) Uses rescue inhaler - once per month Maintenance inhaler - not using regularly due to cost Intervention:  Patient was given savings card that might lower cost of Spiriva to $0.  Continue to use albuterol as needed  Depression with Anxiety:  Controlled Current therapy Trazodone 50mg - 60m 0.5 tablet = 25mg at 23mime as needed Fluoxetine 40mg dail14mcommend continue current regimen for depression and anxiety.  Hyperlipidemia / CAD:  Controlled; LDL goal <70 current therapy: rosuvastatin 20mg daily102m 81mg daily 72mrventions:  Discussed improtance of taking  statin in prevention of cardiovascular disease Patient declined need for refill for rosuvastatin today. Plan to continue to monitor adherence.  Discussed limiting intake of saturated and trans fat   Medication Management: Over the next 90 days, patient will take medications as prescribed Patient has commercial insurance with [employer] through previous job - [employer] she can use discount card from manufacturer to lower copay. [facility] Interventions:  Provided  discount cards for Spiriva, Trulicity, Farxiga and Basaglar  Patient self care activities - Over the next 90 days patient will: Take discount cards to your pharmacy to use to lower cost of above medications Contact provider's office or clinical pharmacist if you have an problems of questions about medications or medication costs.      Medication Assistance: Provided coupons to lower copay for Spiriva, FarVickey Hugerand Basaglar   Patient's preferred pharmacy is:  [id number] [id number] [place] [address] [id number] [id number] [phone]  [phone] Patient agrees to Care Plan and Follow-up.  Plan: Telephone follow up appointment with care management team member scheduled for:  4 weeks.   Erryn Dickison, Cherre Robinscal Pharmacist [facility]

## 2021-03-12 NOTE — Patient Instructions (Signed)
Visit Information  PATIENT GOALS:  Goals Addressed             This Visit's Progress    Chronic Care Management Pharmacy Care Plan   On track    Current Barriers:  Patient states Stiolto copay is high especially with other medications she takes that have brand / high copays Unable to achieve control of hypertension (high blood pressure) and diabetes  Reports high cost of Farxiga and Trulicity  Chronic Obstructive Pulmonary Disease: Pharmacist Clinical Goal(s):  Over the next 90 days, patient will continue to adhere to current inhaler regimen and identify potential cost savings through collaboration with PharmD and provider.  Current treatment:  Spiriva 2.'5mg'$  - 1 inhalation once a day;  Albuterol inhaler as needed for shortness of breath and wheezing;  montelukast '10mg'$  daily at bedtime;  benzonatate '200mg'$  up to 3 times a day as needed for cough.  Interventions:  Recommended continue current regimen.  Provided discount coupon for Spiriva Patient self care activities - Over the next 90 days patient will: Take medications as prescribed Collaborate with provider on medication access solutions  Hypertension / High Blood Pressure: Pharmacist Clinical Goal(s):  Over the next 90 days, patient will achieve adherence to monitoring guidelines and medication adherence to achieve therapeutic efficacy. Also will achieve control of blood pressure as evidenced by home BP readings < 140/90 through collaboration with PharmD and provider.  Current treatment:  Valsartan '160mg'$  daily metoprolol succinate '100mg'$  daily hydralazine '25mg'$  -Take 2 tablets = '50mg'$  twice a day  furosemide '20mg'$  daily Interventions: Educated on BP goals and improtance of obtaining BP goals to decrease risk of stroke and renal disease Reviewed refill history with pharmacy Patient Self-Care Activities - Over the next 180 days patient will:  Focus on medication adherence by taking all medications as prescribed Check blood  pressure 2 to 3 times per week, document, and provide at future appointments  Diabetes: Pharmacist Clinical Goal(s):  Over the next 90 days, patient will achieve adherence to monitoring guidelines and medication adherence to achieve therapeutic efficacy Achieve control of diabetes as evidenced by meeting goal of A1c <7.0% through collaboration with PharmD and provider.  Lab Results  Component Value Date   HGBA1C 8.3 (H) 11/27/2020  Current treatment: Farxiga '10mg'$  daily Basaglar 30 units daily Trulicity 1.'5mg'$  weekly metformin ER '500mg'$  - take 2 tablets twice a day Interventions: Educated on importance of taking all medication for diabetes as prescribed.  Collaborated with PCP and endocrinologist to improve medication access. Started paperwork for Cruzita Lederer and Trulicity patient assistance programs.   Discussed taking above medication regimen as prescribed to reach A1c goal Reviewed signs and symptoms of hypoglycemia and how to treat.  Patient Self-Care Activities - Over the next 180 days patient will:  Focus on medication adherence by taking all medications as prescribed If you feel blood glucose might be too low to take Basaglar, take half dose instead of skipping Basaglar completely. (Remember Nancee Liter is a long acting insulin and skipping doses can lead to high blood glucose readings later) Check blood glucose 2 times per day; document, and provide at future appointments Collaborate with clinical pharmacist and provider on medication access solutions. Provided discount coupons for Farxiga, Trulicity and Engineer, agricultural (max copay $25)   Depression with Anxiety:  Pharmacist Clinical Goal(s):  Over the next 90 days, patient will continue to adhere to medication regimen for depression and anxiety while minimizing potential side effects Current treatment:  trazodone '50mg'$  - take 0.5 tablet = '25mg'$   at bedtime as needed fluoxetine '40mg'$  daily Interventions:  Recommend continue current  regimen for depression and anxiety. Patient self care activities - Over the next 90 days patient will: Take medications as prescribed  Hyperlipidemia / CAD:  Pharmacist Clinical Goal(s):  Over the next 90 days, patient will maintain LDL of <70 Current treatment:  Rosuvastatin '20mg'$  daily  Aspirin '81mg'$  daily Interventions:  Discussed following diet low in sugar and saturated fat.  Discussed improtance of taking statin in prevention of cardiovascular disease Patient self care activities - Over the next 90 days patient will: Continue current medications as prescribed for heart / cholesterol   Medication Manag ement: Pharmacist Clinical Goal(s):  Over the next 90 days, patient will take medications as prescribed [facility] Interventions:  Provided  discount cards for Spiriva, Trulicity, Farxiga and Basaglar  Patient self care activities - Over the next 90 days patient will: Take discount cards to your pharmacy to use to lower cost of above medications Contact provider's office or clinical pharmacist if you have an problems of questions about medications or medication costs.          Print copy of patient instructions, educational materials, and care plan provided in person.  Telephone follow up appointment with care management team member scheduled for: 4 weeks  [name], PharmD Clinical Pharmacist [facility] [place] [facility]

## 2021-03-14 DIAGNOSIS — F418 Other specified anxiety disorders: Secondary | ICD-10-CM | POA: Diagnosis not present

## 2021-03-14 DIAGNOSIS — E119 Type 2 diabetes mellitus without complications: Secondary | ICD-10-CM

## 2021-03-14 DIAGNOSIS — E118 Type 2 diabetes mellitus with unspecified complications: Secondary | ICD-10-CM | POA: Diagnosis not present

## 2021-03-14 DIAGNOSIS — J449 Chronic obstructive pulmonary disease, unspecified: Secondary | ICD-10-CM | POA: Diagnosis not present

## 2021-03-14 DIAGNOSIS — Z794 Long term (current) use of insulin: Secondary | ICD-10-CM

## 2021-03-14 DIAGNOSIS — E785 Hyperlipidemia, unspecified: Secondary | ICD-10-CM

## 2021-03-14 DIAGNOSIS — I25118 Atherosclerotic heart disease of native coronary artery with other forms of angina pectoris: Secondary | ICD-10-CM | POA: Diagnosis not present

## 2021-03-14 DIAGNOSIS — E1165 Type 2 diabetes mellitus with hyperglycemia: Secondary | ICD-10-CM

## 2021-03-14 LAB — HEMOGLOBIN A1C: Hgb A1c MFr Bld: 8.2 % — ABNORMAL HIGH (ref 4.6–6.5)

## 2021-03-16 MED ORDER — TRULICITY 3 MG/0.5ML ~~LOC~~ SOAJ: 3.0000 mg | SUBCUTANEOUS | 3 refills | Status: DC

## 2021-03-16 NOTE — Addendum Note (Signed)
Addended by: Lamar Blinks C on: 03/16/2021 02:05 PM   Modules accepted: Orders

## 2021-03-22 DIAGNOSIS — S0502XA Injury of conjunctiva and corneal abrasion without foreign body, left eye, initial encounter: Secondary | ICD-10-CM | POA: Diagnosis not present

## 2021-03-26 DIAGNOSIS — S0502XD Injury of conjunctiva and corneal abrasion without foreign body, left eye, subsequent encounter: Secondary | ICD-10-CM | POA: Diagnosis not present

## 2021-03-29 ENCOUNTER — Other Ambulatory Visit: Payer: Self-pay | Admitting: Internal Medicine

## 2021-03-29 ENCOUNTER — Ambulatory Visit (INDEPENDENT_AMBULATORY_CARE_PROVIDER_SITE_OTHER): Payer: Medicare Other

## 2021-03-29 DIAGNOSIS — Z794 Long term (current) use of insulin: Secondary | ICD-10-CM

## 2021-03-29 DIAGNOSIS — J449 Chronic obstructive pulmonary disease, unspecified: Secondary | ICD-10-CM

## 2021-03-29 DIAGNOSIS — E1159 Type 2 diabetes mellitus with other circulatory complications: Secondary | ICD-10-CM

## 2021-03-29 DIAGNOSIS — E119 Type 2 diabetes mellitus without complications: Secondary | ICD-10-CM

## 2021-03-29 NOTE — Patient Instructions (Signed)
Visit Information  PATIENT GOALS:  Goals Addressed             This Visit's Progress    Monitor and Manage My Blood Sugar   On track    Timeframe:  Long-Range Goal Priority:  Medium Start Date:    08/30/20                        Expected End Date:    06/28/21                  Follow Up Date 05/01/21   Patient Goals/Self-Care Activities continue to check blood sugars per provider recommendation and take blood sugar log to your doctor visits.  continue to take medications as prescribed  notify provider for sustained elevations in blood sugar or blood sugars less than 70. attend provider visits as scheduled and contact your provider with questions or concerns continue to work with embedded clinic pharmacist, [name], Pharm D for medication questions or concerns.  Continue to work on eating a healthy diet: lean protein, fruits, vegetables, low salt, low in fats. Cut fat from your meat and eat whole grain breads and cereals. Avoid greasy, processed food or foods that are high in fat. Monitor portion sizes and minimize processed foods. Personal goal: "decrease the amount of potato chips I eat". Increase activity per provider recommendations     Track and Manage my Respiratory Symptoms   On track    Timeframe:  Long-Range Goal Priority:  Medium Start Date:   08/30/20                        Expected End Date: 06/28/21                     Follow Up Date 05/01/21   Patient Goals/Self-Care Activities:  keep all follow-up appointments.  Continue to follow the COPD action plan as needed. Continue to take your medications as prescribed. Call your provider with and worsening signs/symptoms or concerns.  Continue to work with care management team for ongoing care coordination and health care needs.         Patient verbalizes understanding of instructions provided today and agrees to view in [place].   Telephone follow up appointment with care management team member scheduled  for:05/01/21 The patient has been provided with contact information for the care management team and has been advised to call with any health related questions or concerns.   Thea Silversmith, RN, MSN, BSN, CCM Care Management Coordinator [facility] [phone]

## 2021-03-29 NOTE — Chronic Care Management (AMB) (Signed)
Chronic Care Management   CCM RN Visit Note  03/29/2021 Name: Destiny Ballard MRN: [id number] DOB: 1950/10/31  Subjective: PAIDYN MCFERRAN is a 70 y.o. year old female who is a primary care patient of Copland, Gay Filler, MD. The care management team was consulted for assistance with disease management and care coordination needs.    Engaged with patient by telephone for follow up visit in response to provider referral for case management and/or care coordination services.   Consent to Services:  The patient was given information about Chronic Care Management services, agreed to services, and gave verbal consent prior to initiation of services.  Please see initial visit note for detailed documentation.   Patient agreed to services and verbal consent obtained.   Assessment: Review of patient past medical history, allergies, medications, health status, including review of consultants reports, laboratory and other test data, was performed as part of comprehensive evaluation and provision of chronic care management services.   SDOH (Social Determinants of Health) assessments and interventions performed:    CCM Care Plan  Allergies  Allergen Reactions   Prednisone Other (See Comments)    REACTION: mood swings, nightmares. "Shot doesn't bother me, reaction is just with the pill" she states she has had the steroid injections before. From our records methylprednisone was given to her in 2013 without any complications.    Outpatient Encounter Medications as of 03/29/2021  Medication Sig Note   albuterol (VENTOLIN HFA) 108 (90 Base) MCG/ACT inhaler INHALE 2 PUFFS INTO THE LUNGS EVERY EIGHT HOURS AS NEEDED FOR WHEEZING OR SHORTNESS OF BREATH.    aspirin EC 81 MG tablet Take 81 mg by mouth daily.    calcium carbonate (TUMS EX) 750 MG chewable tablet Chew 1 tablet by mouth daily as needed for heartburn.    Cholecalciferol (VITAMIN D3) 1000 UNITS CAPS Take 1,000 Units by mouth daily.     Dulaglutide (TRULICITY) 3 SF/6.8LE SOPN Inject 3 mg as directed once a week.    FARXIGA 10 MG TABS tablet TAKE 1 TABLET BY MOUTH EVERY DAY    fexofenadine (ALLEGRA) 180 MG tablet Take 1 tablet by mouth daily.    FLUoxetine (PROZAC) 40 MG capsule TAKE 1 CAPSULE BY MOUTH EVERY DAY    fluticasone (FLONASE) 50 MCG/ACT nasal spray Place 2 sprays into both nostrils as needed for allergies or rhinitis.    furosemide (LASIX) 20 MG tablet TAKE 1 TABLET BY MOUTH EVERY DAY    gabapentin (NEURONTIN) 300 MG capsule TAKE 2 CAPSULES BY MOUTH 2 TIMES DAILY.    hydrALAZINE (APRESOLINE) 25 MG tablet Take 50 mg by mouth 2 (two) times daily.    hydrOXYzine (ATARAX/VISTARIL) 25 MG tablet TAKE 1/2 TO 1 TABLET BY MOUTH EVERY 8 HOURS AS NEEDED FOR ITCHING    Insulin Glargine (BASAGLAR KWIKPEN) 100 UNIT/ML Inject 30 Units into the skin daily.    LORazepam (ATIVAN) 0.5 MG tablet Take 1 tablet (0.5 mg total) by mouth 2 (two) times daily as needed for anxiety.    magnesium oxide (MAG-OX) 400 MG tablet Take 400 mg by mouth daily.    metFORMIN (GLUCOPHAGE-XR) 500 MG 24 hr tablet Take 2 tablets (1,000 mg total) by mouth 2 (two) times daily.    metoprolol succinate (TOPROL-XL) 100 MG 24 hr tablet Take 1 tablet (100 mg total) by mouth daily.    montelukast (SINGULAIR) 10 MG tablet Take 1 tablet (10 mg total) by mouth at bedtime.    nitroGLYCERIN (NITROSTAT) 0.4 MG SL tablet Place 1  tablet (0.4 mg total) under the tongue every 5 (five) minutes as needed for chest pain.    ondansetron (ZOFRAN ODT) 4 MG disintegrating tablet Take 1 tablet (4 mg total) by mouth every 8 (eight) hours as needed for nausea or vomiting.    Potassium 99 MG TABS Take 1 tablet by mouth daily.    rosuvastatin (CRESTOR) 20 MG tablet TAKE 1 TABLET BY MOUTH EVERY DAY    Tiotropium Bromide Monohydrate (SPIRIVA RESPIMAT) 2.5 MCG/ACT AERS Inhale 2 puffs into the lungs daily. 02/26/2021: States she takes as needed.   traZODone (DESYREL) 50 MG tablet TAKE 1/2 TO 1  TABLET BY MOUTH AT BEDTIME AS NEEDED FOR SLEEP    valsartan (DIOVAN) 160 MG tablet Take 1 tablet (160 mg total) by mouth daily.    vitamin E 180 MG (400 UNITS) capsule Take 400 Units by mouth daily.    zinc gluconate 50 MG tablet Take 50 mg by mouth daily.    Insulin Pen Needle 32G X 4 MM MISC 1 Device by Does not apply route daily.    No facility-administered encounter medications on file as of 03/29/2021.    Patient Active Problem List   Diagnosis Date Noted   Melena 12/18/2020   History of colonic polyps 12/18/2020   Constipation 12/18/2020   Obesity    Iron deficiency anemia    Insulin resistance    Hypertension    Hyperlipidemia    Diabetes mellitus without complication ([place])    Depression    Cervical spondylosis    Anxiety    Pneumonia due to COVID-19 virus 08/21/2020   Hyperlipidemia associated with type 2 diabetes mellitus ([place]) 08/21/2020   Uncontrolled type 2 diabetes mellitus with complication, with long-term current use of insulin ([place]) 03/14/2020   Type 2 diabetes mellitus with hyperglycemia, with long-term current use of insulin ([place]) 12/10/2019   Type 2 diabetes mellitus with diabetic polyneuropathy, with long-term current use of insulin ([place]) 12/10/2019   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   Tinnitus of both ears 11/18/2019   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   Eustachian tube dysfunction, bilateral 11/18/2019   Chronic eczematous otitis externa of both ears 11/18/2019   Obesity (BMI 30-39.9) 08/24/2018   Depression with anxiety 08/24/2018   CAD (coronary artery disease) 08/24/2018   Abnormal cardiovascular stress test 08/24/2018   Lobar pneumonia, unspecified organism ([place]) 11/13/2017   Precordial chest pain    Sepsis ([place]) 09/18/2017   Chronic diastolic CHF (congestive heart failure) ([place]) 09/18/2017   Diabetes mellitus ([place]) 09/08/2017   Left hip pain 10/28/2016   Chronic rhinitis 08/07/2016   COPD with  acute exacerbation ([place]) 04/25/2016   Hypertension associated with diabetes ([place]) 01/30/2016   Hypokalemia 01/27/2016   HLD (hyperlipidemia)    Type 2 diabetes mellitus without complication, without long-term current use of insulin ([facility])    OSA (obstructive sleep apnea) 05/06/2014   Dyspnea on exertion 09/02/2012   Chest pain 12/10/2011   Chronic cough 05/11/2010   GERD 06/28/2008   Coronary atherosclerosis 06/28/2008    Conditions to be addressed/monitored:HLD, COPD, DMII, Anxiety, and Depression  Care Plan : COPD (Adult)  Updates made by Luretha Rued, RN since 03/29/2021 12:00 AM     Problem: Symptom Exacerbation (COPD) due to Covid pneumonia   Priority: Medium     Long-Range Goal: Symptom Exacerbation Prevented or Minimized   Start Date: 08/30/2020  Expected End Date: 06/28/2021  This Visit's Progress: On track  Recent Progress: On  track  Priority: Medium  Note:   Current Barriers:  Knowledge deficits related to long term plan for self management of COPD-hospitalization 08/21/20-08/23/20 due to Covid pneumonia. Patient with no complaints at this time. She reports she has taken the flu vaccine, pneumonia vaccine and Covid vaccine. She expressed that she does not have a refill on Tessalon Pearls, but states will discuss with PCP at next visit on 04/02/21. Ms. Michalsky reports that coupon provided by clinical pharmacist helped in getting her medication. Case Manager Clinical Goal(s): patient will be able to verbalize understanding of COPD action plan and when to seek appropriate levels of medical care  Interventions:  Collaboration with Copland, Gay Filler, MD regarding development and update of comprehensive plan of care as evidenced by provider attestation and co-signature Inter-disciplinary care team collaboration (see longitudinal plan of care) Discussed medications with client. Upcoming appointments reviewed Encouraged client to continue to work with Clinical pharmacist. Care  Coordination with clinical pharmacist as needed.  COPD action plan discussed.  Discussed plan with patient for ongoing care management follow up and provided patient with direct contact information for care management team. Patient Goals/Self-Care Activities:  keep all follow-up appointments.  Continue to follow the COPD action plan as needed. Continue to take your medications as prescribed. Call your provider with and worsening signs/symptoms or concerns.  Continue to work with care management team for ongoing care coordination and health care needs.  Follow Up Plan: Telephone follow up appointment with care management team member scheduled for: 05/01/21 The patient has been provided with contact information for the care management team and has been advised to call with any health related questions or concerns.      Care Plan : Diabetes Type 2 (Adult)  Updates made by Luretha Rued, RN since 03/29/2021 12:00 AM     Problem: Disease Progression (Diabetes, Type 2)   Priority: Medium     Long-Range Goal: Disease Progression Prevented or Minimized   Start Date: 08/30/2020  Expected End Date: 06/28/2021  This Visit's Progress: On track  Recent Progress: On track  Priority: Medium  Note:   Objective:  Lab Results  Component Value Date   HGBA1C 8.2 (H) 03/12/2021   Lab Results  Component Value Date   CREATININE 0.53 (L) 02/09/2021   CREATININE 0.61 11/18/2020   CREATININE 0.62 09/04/2020  Objective:  Last practice recorded BP readings:  BP Readings from Last 3 Encounters:  03/12/21 (!) 142/82  02/09/21 (!) 144/70  01/09/21 132/88  Most recent eGFR/CrCl:  Lab Results  Component Value Date   EGFR 99 02/09/2021  Current Barriers:  Knowledge Deficits related to long term self care management of Diabetes. Client also has history of cardiac disease, HLD. Client reports she has not checked blood sugar today. She reports she does not check her blood sugar every day. She reports  her last blood sugar reading was 141 ac (a couple of days ago). She denies  signs/symptoms of hypoglycemia or hyperglycemia. She reports her mood is about the same-latest visit with primary care provider on 03/12/21. Patient continues medication: Fluoxetine daily and lorazepam prn. She reports an upcoming trip to [place] planned for October and states she is looking forward to this trip.  Does not check blood sugars as recommended. PHQ2 6/ PHQ9 12 Medication Adherence Case Manager Clinical Goal(s):  patient will demonstrate improved adherence to prescribed treatment plan for diabetes self care/management as evidenced by: scheduling an appointment with endocrinologist contact provider for new or worsened symptoms  or questions. Interventions:  Collaboration with Copland, Gay Filler, MD regarding development and update of comprehensive plan of care as evidenced by provider attestation and co-signature Inter-disciplinary care team collaboration (see longitudinal plan of care) Collaboration with [name], Pharm D regarding client medication questions. Reviewed medications with patient. And discussed importance of taking medications as prescribed.  Encouraged client to continue checking blood sugar per provider recommendation Discussed meal plan and encouraged patient to avoid sugar and trans fats.   Reviewed scheduled/upcoming provider appointments-next appointment with PCP 04/02/21. RNCM encouraged client to continue to work with clinic pharmacist, [name] regarding medications and medication assistance. Discussed plans with patient for ongoing care management follow up and provided patient with direct contact information for care management team Patient Goals/Self-Care Activities continue to check blood sugars per provider recommendation and take blood sugar log to your doctor visits.  continue to take medications as prescribed  notify provider for sustained elevations in blood sugar or  blood sugars less than 70. attend provider visits as scheduled contact your provider with questions or concerns continue to work with embedded clinic pharmacist, [name], Pharm D for medication questions or concerns.  Continue to work on eating a healthy diet: lean protein, fruits, vegetables, low salt, low in fats. Cut fat from your meat and eat whole grain breads and cereals. Avoid greasy, processed food or foods that are high in fat. Monitor portion sizes and minimize processed foods. Personal goal: "decrease the amount of potato chips I eat". Increase activity per provider recommendations Follow Up Plan: Telephone follow up appointment with care management team member scheduled for: 05/01/21 The patient has been provided with contact information for the care management team and has been advised to call with any health related questions or concerns.     Plan:Telephone follow up appointment with care management team member scheduled for:  05/01/21 and The patient has been provided with contact information for the care management team and has been advised to call with any health related questions or concerns.   Thea Silversmith, RN, MSN, BSN, CCM Care Management Coordinator [facility] [phone]

## 2021-04-02 ENCOUNTER — Ambulatory Visit: Payer: Medicare Other | Admitting: Family Medicine

## 2021-04-02 ENCOUNTER — Telehealth: Payer: Self-pay

## 2021-04-02 NOTE — Telephone Encounter (Signed)
  Physician Copland, Janett Billow- MD Contact Type Call Who Is Calling Patient / Member / Family / Caregiver Caller Name [facility] Phone Number [phone] Patient Name Destiny Ballard Patient DOB 03-Jul-1951 Call Type Message Only Information Provided Reason for Call Request to [facility] Appointment Initial Comment The caller states that she would like to cancel an upcoming appointment. Patient request to speak to RN No Additional Comment The caller declined triage

## 2021-04-02 NOTE — Telephone Encounter (Signed)
Called to verify, had to leave a VM.

## 2021-04-13 DIAGNOSIS — J449 Chronic obstructive pulmonary disease, unspecified: Secondary | ICD-10-CM | POA: Diagnosis not present

## 2021-04-13 DIAGNOSIS — E119 Type 2 diabetes mellitus without complications: Secondary | ICD-10-CM

## 2021-04-18 ENCOUNTER — Telehealth: Payer: Medicare Other

## 2021-04-18 ENCOUNTER — Telehealth: Payer: Self-pay | Admitting: Pharmacist

## 2021-04-18 NOTE — Telephone Encounter (Signed)
Attempted to out reach patient for Chronic Care Management follow up phone call . No answer. LM on VM with contact information for clinical pharmacist [phone]

## 2021-04-23 ENCOUNTER — Telehealth: Payer: Medicare Other

## 2021-04-27 ENCOUNTER — Telehealth: Payer: Medicare Other

## 2021-04-30 ENCOUNTER — Other Ambulatory Visit: Payer: Self-pay | Admitting: Internal Medicine

## 2021-04-30 DIAGNOSIS — Z794 Long term (current) use of insulin: Secondary | ICD-10-CM

## 2021-04-30 DIAGNOSIS — Z1152 Encounter for screening for COVID-19: Secondary | ICD-10-CM | POA: Diagnosis not present

## 2021-05-26 ENCOUNTER — Other Ambulatory Visit: Payer: Self-pay | Admitting: Family Medicine

## 2021-05-26 DIAGNOSIS — F32A Depression, unspecified: Secondary | ICD-10-CM

## 2021-05-26 DIAGNOSIS — I10 Essential (primary) hypertension: Secondary | ICD-10-CM

## 2021-06-12 NOTE — Progress Notes (Signed)
Subjective:   Destiny Ballard is a 70 y.o. female who presents for Medicare Annual (Subsequent) preventive examination.  I connected with Anjanette today by telephone and verified that I am speaking with the correct person using two identifiers. Location patient: home Location provider: work Persons participating in the virtual visit: patient, Marine scientist.    I discussed the limitations, risks, security and privacy concerns of performing an evaluation and management service by telephone and the availability of in person appointments. I also discussed with the patient that there may be a patient responsible charge related to this service. The patient expressed understanding and verbally consented to this telephonic visit.    Interactive audio and video telecommunications were attempted between this provider and patient, however failed, due to patient having technical difficulties OR patient did not have access to video capability.  We continued and completed visit with audio only.  Some vital signs may be absent or patient reported.   Time Spent with patient on telephone encounter: 25 minutes   Review of Systems     Cardiac Risk Factors include: advanced age (>67men, >7 women);diabetes mellitus;dyslipidemia;hypertension;obesity (BMI >30kg/m2)     Objective:    Today's Vitals   06/14/21 0900  Weight: 178 lb (80.7 kg)  Height: 4\' 11"  (1.499 m)   Body mass index is 35.95 kg/m.  Advanced Directives 06/14/2021 11/18/2020 08/30/2020 08/21/2020 05/04/2020 03/18/2020 12/18/2019  Does Patient Have a Medical Advance Directive? Yes No Yes No Yes No No  Type of Advance Directive Living will - Living will - Living will - -  Does patient want to make changes to medical advance directive? - - No - Patient declined - No - Patient declined - -  Copy of [place] in Chart? - - - - - - -  Would patient like information on creating a medical advance directive? - No - Patient declined - No -  Patient declined - No - Patient declined -  Pre-existing out of facility DNR order (yellow form or pink MOST form) - - - - - - -    Current Medications (verified) Outpatient Encounter Medications as of 06/14/2021  Medication Sig   albuterol (VENTOLIN HFA) 108 (90 Base) MCG/ACT inhaler INHALE 2 PUFFS INTO THE LUNGS EVERY EIGHT HOURS AS NEEDED FOR WHEEZING OR SHORTNESS OF BREATH.   aspirin EC 81 MG tablet Take 81 mg by mouth daily.   calcium carbonate (TUMS EX) 750 MG chewable tablet Chew 1 tablet by mouth daily as needed for heartburn.   Cholecalciferol (VITAMIN D3) 1000 UNITS CAPS Take 1,000 Units by mouth daily.   dapagliflozin propanediol (FARXIGA) 10 MG TABS tablet Take 1 tablet (10 mg total) by mouth daily.   Dulaglutide (TRULICITY) 1.5 QI/3.4VQ SOPN INJECT 1.5 MG INTO THE SKIN ONCE A WEEK   Dulaglutide (TRULICITY) 3 QV/9.5GL SOPN Inject 3 mg as directed once a week.   fexofenadine (ALLEGRA) 180 MG tablet Take 1 tablet by mouth daily.   FLUoxetine (PROZAC) 40 MG capsule TAKE 1 CAPSULE BY MOUTH EVERY DAY   fluticasone (FLONASE) 50 MCG/ACT nasal spray Place 2 sprays into both nostrils as needed for allergies or rhinitis.   furosemide (LASIX) 20 MG tablet TAKE 1 TABLET BY MOUTH EVERY DAY   gabapentin (NEURONTIN) 300 MG capsule TAKE 2 CAPSULES BY MOUTH 2 TIMES DAILY.   hydrALAZINE (APRESOLINE) 25 MG tablet Take 50 mg by mouth 2 (two) times daily.   hydrOXYzine (ATARAX/VISTARIL) 25 MG tablet TAKE 1/2 TO 1 TABLET BY  MOUTH EVERY 8 HOURS AS NEEDED FOR ITCHING   Insulin Glargine (BASAGLAR KWIKPEN) 100 UNIT/ML Inject 30 Units into the skin daily.   Insulin Pen Needle 32G X 4 MM MISC 1 Device by Does not apply route daily.   LORazepam (ATIVAN) 0.5 MG tablet Take 1 tablet (0.5 mg total) by mouth 2 (two) times daily as needed for anxiety.   magnesium oxide (MAG-OX) 400 MG tablet Take 400 mg by mouth daily.   metFORMIN (GLUCOPHAGE-XR) 500 MG 24 hr tablet Take 2 tablets (1,000 mg total) by mouth 2 (two)  times daily.   metoprolol succinate (TOPROL-XL) 100 MG 24 hr tablet TAKE 1 TABLET BY MOUTH EVERY DAY   montelukast (SINGULAIR) 10 MG tablet Take 1 tablet (10 mg total) by mouth at bedtime.   nitroGLYCERIN (NITROSTAT) 0.4 MG SL tablet Place 1 tablet (0.4 mg total) under the tongue every 5 (five) minutes as needed for chest pain.   ondansetron (ZOFRAN ODT) 4 MG disintegrating tablet Take 1 tablet (4 mg total) by mouth every 8 (eight) hours as needed for nausea or vomiting.   Potassium 99 MG TABS Take 1 tablet by mouth daily.   rosuvastatin (CRESTOR) 20 MG tablet TAKE 1 TABLET BY MOUTH EVERY DAY   Tiotropium Bromide Monohydrate (SPIRIVA RESPIMAT) 2.5 MCG/ACT AERS Inhale 2 puffs into the lungs daily.   traZODone (DESYREL) 50 MG tablet TAKE 1/2 TO 1 TABLET BY MOUTH AT BEDTIME AS NEEDED FOR SLEEP   valsartan (DIOVAN) 160 MG tablet Take 1 tablet (160 mg total) by mouth daily.   vitamin E 180 MG (400 UNITS) capsule Take 400 Units by mouth daily.   zinc gluconate 50 MG tablet Take 50 mg by mouth daily.   No facility-administered encounter medications on file as of 06/14/2021.    Allergies (verified) Prednisone   History: Past Medical History:  Diagnosis Date   Anxiety    Prior suicide attempt   CAD (coronary artery disease)    a) s/p DES to LAD 07/2005 b) Last Myoview low risk 11/2011 showing small fixed apical perfusion defect (prior MI vs attenuation) but no ischemia - normal EF.   Cervical spondylosis    Chest pain 12/10/2011   Chronic diastolic CHF (congestive heart failure) (HCC) 09/18/2017   Chronic eczematous otitis externa of both ears 11/18/2019   Chronic rhinitis 08/07/2016   Constipation 12/18/2020   COPD with acute exacerbation ([place]) 04/25/2016   Coronary atherosclerosis 06/28/2008   Depression    Depression with anxiety    Diabetes mellitus ([place]) 09/08/2017   Diabetes mellitus without complication ([employer])    Dyspnea on exertion 09/02/2012   CXR 07/2012:  No acute process.     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 EF 60%   CARDIAC CATHETERIZATION N/A 01/29/2016   Procedure: Left Heart Cath and Coronary Angiography;  Surgeon: Sherren Mocha, MD;  Location: [facility];  Service: Cardiovascular;  Laterality: N/A;   CERVICAL SPONDYLOSIS     SINGLE LEVEL FUSION   CHILDBIRTH     X3   CORONARY STENT PLACEMENT  07/2005   LEFT ANTERIOR DESCENDING   FOREARM FRACTURE SURGERY  2010   hand and shoulder    INCISION AND DRAINAGE BREAST ABSCESS  01/05/2012       INCISION AND DRAINAGE PERIRECTAL ABSCESS N/A 02/18/2014   Procedure: IRRIGATION AND DEBRIDEMENT PERIRECTAL ABSCESS;  Surgeon: Pedro Earls, MD;  Location: [facility];  Service: General;  Laterality: N/A;   LEFT HEART CATH AND CORONARY ANGIOGRAPHY N/A 10/10/2017   Procedure: LEFT HEART CATH AND CORONARY ANGIOGRAPHY;  Surgeon: Burnell Blanks, MD;  Location: [facility];  Service: Cardiovascular;  Laterality: N/A;   LUMBAR LAMINECTOMY     ROTATOR CUFF REPAIR     [place]   TONSILLECTOMY AND ADENOIDECTOMY     TUBAL LIGATION     VIDEO BRONCHOSCOPY Bilateral 08/13/2016   Procedure: VIDEO BRONCHOSCOPY WITHOUT FLUORO;  Surgeon: Collene Gobble, MD;  Location: [facility];  Service: Cardiopulmonary;  Laterality: Bilateral;   Family History  Problem Relation Age of Onset   Heart attack Mother    Diabetes Mother    Lung cancer Mother    Asthma Mother    Heart disease Mother    Suicidality Father        "killed himself"   Asthma Daughter        x 2   Cancer Daughter        pre-cancerous polyp   Diabetes Sister    Cancer Sister    Cervical cancer Daughter        cervical    Allergies Other        all family--seasonal allergies   Social History   Socioeconomic History   Marital status: Widowed    Spouse name: Lake Bells   Number of children: 3   Years of education: 12   Highest education level: Not on file  Occupational History   Occupation: retired from [employer]: 11/2012  Tobacco Use   Smoking status: Never    Passive exposure: Yes   Smokeless tobacco: Never  Vaping Use   Vaping Use: Never used  Substance and Sexual Activity   Alcohol use: Yes    Comment: occ   Drug use: No   Sexual activity: Not Currently    Partners: Male    Birth control/protection: None  Other Topics Concern   Not on file  Social History Narrative   Their eldest daughter lives upstairs.   Social Determinants of Health   Financial Resource Strain: Medium Risk   Difficulty of Paying Living Expenses: Somewhat hard  Food Insecurity: No Food Insecurity   Worried About Charity fundraiser in  the Last Year: Never true    Ran Out of Food in the Last Year: Never true  Transportation Needs: No Transportation Needs   Lack of Transportation (Medical): No   Lack of Transportation (Non-Medical): No  Physical Activity: Inactive   Days of Exercise per Week: 0 days   Minutes of Exercise per Session: 0 min  Stress: No Stress Concern Present   Feeling of Stress : Only a little  Social Connections: Socially Isolated   Frequency of Communication with Friends and Family: More than three times a week   Frequency of Social Gatherings with Friends and Family: More than three times a week   Attends Religious Services: Never   Marine scientist or Organizations: No   Attends Archivist Meetings: Never   Marital Status: Widowed    Tobacco Counseling Counseling given: Not Answered   Clinical Intake:  Pre-visit preparation completed: Yes  Pain : No/denies pain     BMI - recorded: 35.95 Nutritional Status: BMI > 30  Obese Nutritional Risks: None Diabetes: Yes CBG done?: No Did pt. bring in CBG monitor from home?: No (phone visit)  How often do you need to have someone help you when you read instructions, pamphlets, or other written materials from your doctor or pharmacy?: 1 - Never Diabetes:  Is the patient diabetic?  Yes  If diabetic, was a CBG obtained today?  No  Did the patient bring in their glucometer from home?  No phone visit How often do you monitor your CBG's? Several times per week.   Financial Strains and Diabetes Management:  Are you having any financial strains with the device, your supplies or your medication? No .  Does the patient want to be seen by Chronic Care Management for management of their diabetes?  No  Would the patient like to be referred to a Nutritionist or for Diabetic Management?  No   Diabetic Exams:  Diabetic Eye Exam: Completed 03/08/2021.   Diabetic Foot Exam: Completed 09/04/2020.    Interpreter Needed?: No  Information entered by :: Caroleen Hamman LPN   Activities of Daily Living In your present state of health, do you have any difficulty performing the following activities: 06/14/2021 08/21/2020  Hearing? N N  Vision? N N  Difficulty concentrating or making decisions? N N  Walking or climbing stairs? Y N  Dressing or bathing? N N  Doing errands, shopping? N N  Preparing Food and eating ? N -  Using the Toilet? N -  In the past six months, have you accidently leaked urine? Y -  Do you have problems with loss of bowel control? N -  Managing your Medications? N -  Managing your Finances? N -  Housekeeping or managing your Housekeeping? N -  Some recent data might be hidden    Patient Care Team: Copland, Gay Filler, MD as PCP - General (Family Medicine) Brien Few, MD as Consulting Physician (Obstetrics and Gynecology) Luretha Rued, RN as Case Manager [name], RPH-CPP (Pharmacist)  Indicate any recent Medical Services you may have received from other than [facility] providers in the past year (date may be approximate).     Assessment:   This is a routine wellness examination for Anneliese.  Hearing/Vision screen Hearing Screening - Comments:: C/o hearing loss in left ear Vision Screening - Comments:: Last eye exam-02/2021-Dr. Delman Cheadle  Dietary issues and exercise activities discussed: Current Exercise Habits: The patient does not participate in regular exercise at present, Exercise limited  by: Other - see comments (Neuropathy)   Goals Addressed             This Visit's Progress    Patient Stated       Increase activity as tolerated       Depression Screen PHQ 2/9 Scores 06/14/2021 02/26/2021 02/09/2021 11/27/2020 08/30/2020 01/06/2020 12/29/2018  PHQ - 2 Score 0 6 1 6 6 6 2   PHQ- 9 Score - 12 - - 12 13 7   Exception Documentation - - - - - - -    Fall Risk Fall Risk  06/14/2021 02/27/2021 02/09/2021 11/27/2020 08/30/2020  Falls in the past year? 0 0 0 1 0  Comment - - - - -  Number falls in past yr: 0 0 0 0  -  Injury with Fall? 0 0 0 0 -  Comment - - - - -  Risk for fall due to : - No Fall Risks No Fall Risks - -  Follow up Falls prevention discussed Falls evaluation completed Falls evaluation completed - -    FALL RISK PREVENTION PERTAINING TO THE HOME:  Any stairs in or around the home? No  Home free of loose throw rugs in walkways, pet beds, electrical cords, etc? Yes  Adequate lighting in your home to reduce risk of falls? Yes   ASSISTIVE DEVICES UTILIZED TO PREVENT FALLS:  Life alert? No  Use of a cane, walker or w/c? No  Grab bars in the bathroom? Yes  Shower chair or bench in shower? Yes  Elevated toilet seat or a handicapped toilet? No   TIMED UP AND GO:  Was the test performed? No . Phone visit   Cognitive Function:Normal cognitive status assessed by this Nurse Health Advisor. No abnormalities found.   MMSE - Mini Mental State Exam 09/01/2017  Orientation to time 5  Orientation to Place 5  Registration 3  Attention/ Calculation 5  Recall 3  Language- name 2 objects 2  Language- repeat 1  Language- follow 3 step command 3  Language- read & follow direction 1  Write a sentence 1  Copy design 1  Total score 30        Immunizations Immunization History  Administered Date(s) Administered   Fluad Quad(high Dose 65+) 09/04/2020   Influenza Whole 04/14/2010   Influenza, High Dose Seasonal PF 04/03/2017, 05/11/2018, 03/05/2019, 03/12/2021   Influenza,inj,Quad PF,6+ Mos 05/10/2013, 04/05/2014, 05/22/2015, 04/25/2016   PFIZER Comirnaty(Gray Top)Covid-19 Tri-Sucrose Vaccine 01/11/2021, 02/01/2021   Pneumococcal Conjugate-13 07/24/2016   Pneumococcal Polysaccharide-23 04/05/2014, 05/22/2015   Tdap 07/11/2014, 03/05/2019   Zoster Recombinat (Shingrix) 03/05/2019, 06/05/2019    TDAP status: Up to date  Flu Vaccine status: Up to date  Pneumococcal vaccine status: Up to date  Covid-19 vaccine status: Information provided on how to obtain vaccines.   Qualifies  for Shingles Vaccine? No   Zostavax completed No   Shingrix Completed?: Yes  Screening Tests Health Maintenance  Topic Date Due   MAMMOGRAM  01/17/2021   COVID-19 Vaccine (3 - Booster for Pfizer series) 03/29/2021   FOOT EXAM  09/04/2021   HEMOGLOBIN A1C  09/11/2021   COLONOSCOPY (Pts 45-3yrs Insurance coverage will need to be confirmed)  10/30/2021   OPHTHALMOLOGY EXAM  03/08/2022   TETANUS/TDAP  03/04/2029   Pneumonia Vaccine 45+ Years old  Completed   INFLUENZA VACCINE  Completed   DEXA SCAN  Completed   Hepatitis C Screening  Completed   Zoster Vaccines- Shingrix  Completed   HPV VACCINES  Aged Out  Health Maintenance  Health Maintenance Due  Topic Date Due   MAMMOGRAM  01/17/2021   COVID-19 Vaccine (3 - Booster for Pfizer series) 03/29/2021    Colorectal cancer screening: Referral to GI placed today. Pt aware the office will call re: appt.  Mammogram status: Declined  Bone Density status: Ordered today. Pt provided with contact info and advised to call to schedule appt.  Lung Cancer Screening: (Low Dose CT Chest recommended if Age 69-80 years, 30 pack-year currently smoking OR have quit w/in 15years.) does not qualify.    Additional Screening:  Hepatitis C Screening: Completed 09/20/2015  Vision Screening: Recommended annual ophthalmology exams for early detection of glaucoma and other disorders of the eye. Is the patient up to date with their annual eye exam?  Yes  Who is the provider or what is the name of the office in which the patient attends annual eye exams? Dr. Delman Cheadle   Dental Screening: Recommended annual dental exams for proper oral hygiene  Community Resource Referral / Chronic Care Management: CRR required this visit?  Yes  [place]old in house  CCM required this visit?  No      Plan:     I have personally reviewed and noted the following in the patient's chart:   Medical and social history Use of alcohol, tobacco or illicit drugs  Current  medications and supplements including opioid prescriptions.  Functional ability and status Nutritional status Physical activity Advanced directives List of other physicians Hospitalizations, surgeries, and ER visits in previous 12 months Vitals Screenings to include cognitive, depression, and falls Referrals and appointments  In addition, I have reviewed and discussed with patient certain preventive protocols, quality metrics, and best practice recommendations. A written personalized care plan for preventive services as well as general preventive health recommendations were provided to patient.   Due to this being a telephonic visit, the after visit summary with patients personalized plan was offered to patient via mail or my-chart.Patient would like to access on my-chart.   Marta Antu, LPN   11/17/9792  Nurse Health Advisor  Nurse Notes: None

## 2021-06-14 ENCOUNTER — Other Ambulatory Visit (HOSPITAL_BASED_OUTPATIENT_CLINIC_OR_DEPARTMENT_OTHER): Payer: Self-pay | Admitting: Family Medicine

## 2021-06-14 ENCOUNTER — Ambulatory Visit (INDEPENDENT_AMBULATORY_CARE_PROVIDER_SITE_OTHER): Payer: Medicare Other

## 2021-06-14 VITALS — Ht 59.0 in | Wt 178.0 lb

## 2021-06-14 DIAGNOSIS — Z1211 Encounter for screening for malignant neoplasm of colon: Secondary | ICD-10-CM

## 2021-06-14 DIAGNOSIS — Z780 Asymptomatic menopausal state: Secondary | ICD-10-CM

## 2021-06-14 DIAGNOSIS — Z0000 Encounter for general adult medical examination without abnormal findings: Secondary | ICD-10-CM

## 2021-06-14 NOTE — Patient Instructions (Signed)
[name] , Thank you for taking time to complete your Medicare Wellness Visit. I appreciate your ongoing commitment to your health goals. Please review the following plan we discussed and let me know if I can assist you in the future.   Screening recommendations/referrals: Colonoscopy: Ordered today. Someone will call you to schedule.  Mammogram: Declined. Please call to schedule if you change your mind. Bone Density: Ordered today. Someone will call you to schedule.  Recommended yearly ophthalmology/optometry visit for glaucoma screening and checkup Recommended yearly dental visit for hygiene and checkup  Vaccinations: Influenza vaccine: Up to date Pneumococcal vaccine: Up to date Tdap vaccine: Up to date Shingles vaccine: Completed vaccines   Covid-19:Booster available at the pharmacy  Advanced directives: Please bring a copy of Living Will and/or Healthcare Power of Attorney for your chart.   Conditions/risks identified: See problem list  Next appointment: Follow up in one year for your annual wellness visit 06/17/2022 @ 9:40   Preventive Care 65 Years and Older, Female Preventive care refers to lifestyle choices and visits with your health care provider that can promote health and wellness. What does preventive care include? A yearly physical exam. This is also called an annual well check. Dental exams once or twice a year. Routine eye exams. Ask your health care provider how often you should have your eyes checked. Personal lifestyle choices, including: Daily care of your teeth and gums. Regular physical activity. Eating a healthy diet. Avoiding tobacco and drug use. Limiting alcohol use. Practicing safe sex. Taking low-dose aspirin every day. Taking vitamin and mineral supplements as recommended by your health care provider. What happens during an annual well check? The services and screenings done by your health care provider during your annual well check will  depend on your age, overall health, lifestyle risk factors, and family history of disease. Counseling  Your health care provider may ask you questions about your: Alcohol use. Tobacco use. Drug use. Emotional well-being. Home and relationship well-being. Sexual activity. Eating habits. History of falls. Memory and ability to understand (cognition). Work and work Statistician. Reproductive health. Screening  You may have the following tests or measurements: Height, weight, and BMI. Blood pressure. Lipid and cholesterol levels. These may be checked every 5 years, or more frequently if you are over 51 years old. Skin check. Lung cancer screening. You may have this screening every year starting at age 72 if you have a 30-pack-year history of smoking and currently smoke or have quit within the past 15 years. Fecal occult blood test (FOBT) of the stool. You may have this test every year starting at age 72. Flexible sigmoidoscopy or colonoscopy. You may have a sigmoidoscopy every 5 years or a colonoscopy every 10 years starting at age 72. Hepatitis C blood test. Hepatitis B blood test. Sexually transmitted disease (STD) testing. Diabetes screening. This is done by checking your blood sugar (glucose) after you have not eaten for a while (fasting). You may have this done every 1-3 years. Bone density scan. This is done to screen for osteoporosis. You may have this done starting at age 72. Mammogram. This may be done every 1-2 years. Talk to your health care provider about how often you should have regular mammograms. Talk with your health care provider about your test results, treatment options, and if necessary, the need for more tests. Vaccines  Your health care provider may recommend certain vaccines, such as: Influenza vaccine. This is recommended every year. Tetanus, diphtheria, and acellular pertussis (Tdap, Td)  vaccine. You may need a Td booster every 10 years. Zoster vaccine. You may  need this after age 72. Pneumococcal 13-valent conjugate (PCV13) vaccine. One dose is recommended after age 72. Pneumococcal polysaccharide (PPSV23) vaccine. One dose is recommended after age 72. Talk to your health care provider about which screenings and vaccines you need and how often you need them. This information is not intended to replace advice given to you by your health care provider. Make sure you discuss any questions you have with your health care provider. Document Released: 07/28/2015 Document Revised: 03/20/2016 Document Reviewed: 05/02/2015 Elsevier Interactive Patient Education  2017 [place] Prevention in the Home Falls can cause injuries. They can happen to people of all ages. There are many things you can do to make your home safe and to help prevent falls. What can I do on the outside of my home? Regularly fix the edges of walkways and driveways and fix any cracks. Remove anything that might make you trip as you walk through a door, such as a raised step or threshold. Trim any bushes or trees on the path to your home. Use bright outdoor lighting. Clear any walking paths of anything that might make someone trip, such as rocks or tools. Regularly check to see if handrails are loose or broken. Make sure that both sides of any steps have handrails. Any raised decks and porches should have guardrails on the edges. Have any leaves, snow, or ice cleared regularly. Use sand or salt on walking paths during winter. Clean up any spills in your garage right away. This includes oil or grease spills. What can I do in the bathroom? Use night lights. Install grab bars by the toilet and in the tub and shower. Do not use towel bars as grab bars. Use non-skid mats or decals in the tub or shower. If you need to sit down in the shower, use a plastic, non-slip stool. Keep the floor dry. Clean up any water that spills on the floor as soon as it happens. Remove soap buildup in  the tub or shower regularly. Attach bath mats securely with double-sided non-slip rug tape. Do not have throw rugs and other things on the floor that can make you trip. What can I do in the bedroom? Use night lights. Make sure that you have a light by your bed that is easy to reach. Do not use any sheets or blankets that are too big for your bed. They should not hang down onto the floor. Have a firm chair that has side arms. You can use this for support while you get dressed. Do not have throw rugs and other things on the floor that can make you trip. What can I do in the kitchen? Clean up any spills right away. Avoid walking on wet floors. Keep items that you use a lot in easy-to-reach places. If you need to reach something above you, use a strong step stool that has a grab bar. Keep electrical cords out of the way. Do not use floor polish or wax that makes floors slippery. If you must use wax, use non-skid floor wax. Do not have throw rugs and other things on the floor that can make you trip. What can I do with my stairs? Do not leave any items on the stairs. Make sure that there are handrails on both sides of the stairs and use them. Fix handrails that are broken or loose. Make sure that handrails are as long  as the stairways. Check any carpeting to make sure that it is firmly attached to the stairs. Fix any carpet that is loose or worn. Avoid having throw rugs at the top or bottom of the stairs. If you do have throw rugs, attach them to the floor with carpet tape. Make sure that you have a light switch at the top of the stairs and the bottom of the stairs. If you do not have them, ask someone to add them for you. What else can I do to help prevent falls? Wear shoes that: Do not have high heels. Have rubber bottoms. Are comfortable and fit you well. Are closed at the toe. Do not wear sandals. If you use a stepladder: Make sure that it is fully opened. Do not climb a closed  stepladder. Make sure that both sides of the stepladder are locked into place. Ask someone to hold it for you, if possible. Clearly mark and make sure that you can see: Any grab bars or handrails. First and last steps. Where the edge of each step is. Use tools that help you move around (mobility aids) if they are needed. These include: Canes. Walkers. Scooters. Crutches. Turn on the lights when you go into a dark area. Replace any light bulbs as soon as they burn out. Set up your furniture so you have a clear path. Avoid moving your furniture around. If any of your floors are uneven, fix them. If there are any pets around you, be aware of where they are. Review your medicines with your doctor. Some medicines can make you feel dizzy. This can increase your chance of falling. Ask your doctor what other things that you can do to help prevent falls. This information is not intended to replace advice given to you by your health care provider. Make sure you discuss any questions you have with your health care provider. Document Released: 04/27/2009 Document Revised: 12/07/2015 Document Reviewed: 08/05/2014 Elsevier Interactive Patient Education  2017 [employer].

## 2021-06-15 ENCOUNTER — Other Ambulatory Visit: Payer: Self-pay

## 2021-06-15 ENCOUNTER — Encounter: Payer: Self-pay | Admitting: Cardiology

## 2021-06-15 ENCOUNTER — Ambulatory Visit (INDEPENDENT_AMBULATORY_CARE_PROVIDER_SITE_OTHER): Payer: Medicare Other | Admitting: Cardiology

## 2021-06-15 VITALS — BP 138/68 | HR 90 | Ht 59.0 in | Wt 169.0 lb

## 2021-06-15 DIAGNOSIS — I10 Essential (primary) hypertension: Secondary | ICD-10-CM

## 2021-06-15 DIAGNOSIS — I5032 Chronic diastolic (congestive) heart failure: Secondary | ICD-10-CM | POA: Diagnosis not present

## 2021-06-15 DIAGNOSIS — I25118 Atherosclerotic heart disease of native coronary artery with other forms of angina pectoris: Secondary | ICD-10-CM

## 2021-06-15 DIAGNOSIS — E782 Mixed hyperlipidemia: Secondary | ICD-10-CM

## 2021-06-15 NOTE — Patient Instructions (Signed)

## 2021-06-15 NOTE — Progress Notes (Signed)
Cardiology Office Note:    Date:  06/15/2021   ID:  PAXTON KANAAN, DOB Nov 29, 1950, MRN [id number]  PCP:  Darreld Mclean, MD  Cardiologist:  Jenne Campus, MD    Referring MD: Darreld Mclean, MD   Chief Complaint  Patient presents with   Follow-up       I am doing fine  History of Present Illness:    KAARIN PARDY is a 70 y.o. female with past medical history significant for coronary artery disease, status post PTCA and stenting of the mid LAD in 2007.  Last cardiac catheterization done in 2019 did not show any obstructive lesion.  She did have stress test done recently which showed no evidence of ischemia.  Also have history of dyslipidemia, essential hypertension, diabetes which is still not well controlled. She is coming today to my office for follow-up.  Overall she seems to be doing fine she described to have rare episode of chest pain/angina pectoris that happens maybe once a month for which she needs to take nitroglycerin.  She said this is not more frequent not lasting longer always 1 nitroglycerin helped with the pain.  She did have a stress test done thereafter which showed no significant ischemia.  Described to have some exertional shortness of breath but overall seems to be doing well.  Past Medical History:  Diagnosis Date   Anxiety    Prior suicide attempt   CAD (coronary artery disease)    a) s/p DES to LAD 07/2005 b) Last Myoview low risk 11/2011 showing small fixed apical perfusion defect (prior MI vs attenuation) but no ischemia - normal EF.   Cervical spondylosis    Chest pain 12/10/2011   Chronic diastolic CHF (congestive heart failure) (HCC) 09/18/2017   Chronic eczematous otitis externa of both ears 11/18/2019   Chronic rhinitis 08/07/2016   Constipation 12/18/2020   COPD with acute exacerbation ([place]) 04/25/2016   Coronary atherosclerosis 06/28/2008   Depression    Depression with anxiety    Diabetes mellitus ([place]) 09/08/2017   Diabetes mellitus  without complication ([place])    Dyspnea on exertion 09/02/2012   CXR 07/2012:  No acute process.     Eustachian tube dysfunction, bilateral 11/18/2019   GERD 06/28/2008   GERD (gastroesophageal reflux disease)    History of colonic polyps 12/18/2020   HLD (hyperlipidemia)    Hyperlipidemia    Hyperlipidemia associated with type 2 diabetes mellitus ([place]) 08/21/2020   Hypertension    Hypertension associated with diabetes ([place]) 01/30/2016   Hypokalemia 01/27/2016   Insulin resistance    Iron deficiency anemia    Lobar pneumonia, unspecified organism ([place]) 11/13/2017   Melena 12/18/2020   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   Obesity    Obesity (BMI 30-39.9) 08/24/2018   Last Assessment & Plan:  Exercise and weight reduction is encouraged.   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 EF 60%   CARDIAC CATHETERIZATION N/A 01/29/2016   Procedure: Left Heart Cath and Coronary Angiography;  Surgeon: Sherren Mocha, MD;  Location: [facility];  Service: Cardiovascular;  Laterality: N/A;   CERVICAL SPONDYLOSIS     SINGLE LEVEL FUSION   CHILDBIRTH     X3   CORONARY STENT PLACEMENT  07/2005   LEFT ANTERIOR DESCENDING   FOREARM FRACTURE SURGERY  2010   hand and shoulder    INCISION AND DRAINAGE BREAST ABSCESS  01/05/2012       INCISION AND DRAINAGE  PERIRECTAL ABSCESS N/A 02/18/2014   Procedure: IRRIGATION AND DEBRIDEMENT PERIRECTAL ABSCESS;  Surgeon: Pedro Earls, MD;  Location: [facility];  Service: General;  Laterality: N/A;   LEFT HEART CATH AND CORONARY ANGIOGRAPHY N/A 10/10/2017   Procedure: LEFT HEART CATH AND CORONARY ANGIOGRAPHY;  Surgeon: Burnell Blanks, MD;  Location: [facility];  Service: Cardiovascular;  Laterality: N/A;   LUMBAR LAMINECTOMY     ROTATOR CUFF REPAIR     bilaterla   TONSILLECTOMY AND ADENOIDECTOMY     TUBAL LIGATION     VIDEO BRONCHOSCOPY Bilateral 08/13/2016   Procedure: VIDEO BRONCHOSCOPY WITHOUT FLUORO;  Surgeon: Collene Gobble, MD;  Location: [facility];  Service: Cardiopulmonary;  Laterality: Bilateral;    Current Medications: Current Meds  Medication Sig   albuterol (VENTOLIN HFA) 108 (90 Base) MCG/ACT inhaler INHALE 2 PUFFS INTO THE LUNGS EVERY EIGHT HOURS AS NEEDED FOR WHEEZING OR SHORTNESS OF BREATH. (Patient taking differently: Inhale 2 puffs into the lungs every 8 (eight) hours as needed for wheezing or shortness of breath.)   aspirin EC 81 MG tablet Take 81 mg by mouth daily.   calcium carbonate (TUMS EX) 750 MG chewable tablet Chew 1 tablet by mouth daily as needed for heartburn.   Cholecalciferol (VITAMIN D3) 1000 UNITS CAPS Take 1,000 Units by mouth daily.   dapagliflozin propanediol (FARXIGA) 10 MG TABS tablet Take 1 tablet (10 mg total) by mouth daily.   Dulaglutide (TRULICITY) 1.5 HD/6.2IW SOPN INJECT 1.5 MG INTO THE SKIN ONCE A WEEK   fexofenadine (ALLEGRA) 180 MG tablet Take 1 tablet by mouth daily.   FLUoxetine (PROZAC) 40 MG capsule TAKE 1 CAPSULE BY MOUTH EVERY DAY (Patient taking differently: Take 40 mg by mouth daily. TAKE 1 CAPSULE BY MOUTH EVERY DAY)   fluticasone (FLONASE) 50 MCG/ACT nasal spray Place 2 sprays into both nostrils as needed for allergies or rhinitis.   furosemide (LASIX) 20 MG tablet TAKE 1 TABLET BY MOUTH EVERY DAY (Patient taking differently: Take 20  mg by mouth daily.)   gabapentin (NEURONTIN) 300 MG capsule TAKE 2 CAPSULES BY MOUTH 2 TIMES DAILY. (Patient taking differently: Take 600 mg by mouth 2 (two) times daily.)   hydrALAZINE (APRESOLINE) 25 MG tablet Take 50 mg by mouth 2 (two) times daily.   hydrOXYzine (ATARAX/VISTARIL) 25 MG tablet TAKE 1/2 TO 1 TABLET BY MOUTH EVERY 8 HOURS AS NEEDED FOR ITCHING (Patient taking differently: Take 25 mg by mouth every 8 (eight) hours as needed for itching.)   Insulin Glargine (BASAGLAR KWIKPEN) 100 UNIT/ML Inject 30 Units into the skin daily.   Insulin Pen Needle 32G X 4 MM MISC 1 Device by Does not apply route daily.   LORazepam (ATIVAN) 0.5 MG tablet Take 1 tablet (0.5 mg total) by mouth 2 (two) times daily as needed for anxiety.   magnesium oxide (MAG-OX) 400 MG tablet Take 400 mg  by mouth daily.   metFORMIN (GLUCOPHAGE-XR) 500 MG 24 hr tablet Take 2 tablets (1,000 mg total) by mouth 2 (two) times daily.   metoprolol succinate (TOPROL-XL) 100 MG 24 hr tablet TAKE 1 TABLET BY MOUTH EVERY DAY (Patient taking differently: Take 100 mg by mouth daily.)   montelukast (SINGULAIR) 10 MG tablet Take 1 tablet (10 mg total) by mouth at bedtime.   nitroGLYCERIN (NITROSTAT) 0.4 MG SL tablet Place 1 tablet (0.4 mg total) under the tongue every 5 (five) minutes as needed for chest pain.   Potassium 99 MG TABS Take 1 tablet by mouth daily.   rosuvastatin (CRESTOR) 20 MG tablet TAKE 1 TABLET BY MOUTH EVERY DAY (Patient taking differently: Take 20 mg by mouth daily.)   Tiotropium Bromide Monohydrate (SPIRIVA RESPIMAT) 2.5 MCG/ACT AERS Inhale 2 puffs into the lungs daily.   traZODone (DESYREL) 50 MG tablet TAKE 1/2 TO 1 TABLET BY MOUTH AT BEDTIME AS NEEDED FOR SLEEP (Patient taking differently: Take 25-50 mg by mouth at bedtime as needed for sleep.)   valsartan (DIOVAN) 160 MG tablet Take 1 tablet (160 mg total) by mouth daily.   vitamin E 180 MG (400 UNITS) capsule Take 400 Units by mouth daily.   zinc gluconate 50  MG tablet Take 50 mg by mouth daily.     Allergies:   Prednisone   Social History   Socioeconomic History   Marital status: Widowed    Spouse name: Lake Bells   Number of children: 3   Years of education: 12   Highest education level: Not on file  Occupational History   Occupation: retired from [employer]: 11/2012  Tobacco Use   Smoking status: Never    Passive exposure: Yes   Smokeless tobacco: Never  Vaping Use   Vaping Use: Never used  Substance and Sexual Activity   Alcohol use: Yes    Comment: occ   Drug use: No   Sexual activity: Not Currently    Partners: Male    Birth control/protection: None  Other Topics Concern   Not on file  Social History Narrative   Their eldest daughter lives upstairs.   Social Determinants of Health   Financial Resource Strain: Medium Risk   Difficulty of Paying Living Expenses: Somewhat hard  Food Insecurity: No Food Insecurity   Worried About Charity fundraiser in the Last Year: Never true   Ran Out of Food in the Last Year: Never true  Transportation Needs: No Transportation Needs   Lack of Transportation (Medical): No   Lack of Transportation (Non-Medical): No  Physical Activity: Inactive   Days of Exercise per Week: 0 days   Minutes of Exercise per Session: 0 min  Stress: No Stress Concern Present   Feeling of Stress : Only a little  Social Connections: Socially Isolated   Frequency of Communication with Friends and Family: More than three times a week   Frequency of Social Gatherings with Friends and Family: More than three times a week   Attends Religious Services: Never   Marine scientist or Organizations: No   Attends Archivist Meetings: Never   Marital Status: Widowed     Family History: The patient's family history includes Allergies in an other family member; Asthma in her daughter and mother; Cancer in her daughter and sister; Cervical cancer in her daughter; Diabetes in her mother and sister;  Heart attack in her mother; Heart disease in her mother; Lung cancer in her  mother; Suicidality in her father. ROS:   Please see the history of present illness.    All 14 point review of systems negative except as described per history of present illness  EKGs/Labs/Other Studies Reviewed:      Recent Labs: 08/23/2020: Magnesium 2.2 02/09/2021: ALT 11; BUN 13; Creatinine, Ser 0.53; Hemoglobin 12.0; Platelets 265; Potassium 4.4; Sodium 143  Recent Lipid Panel    Component Value Date/Time   CHOL 130 01/01/2021 1144   TRIG 266 (H) 01/01/2021 1144   HDL 44 01/01/2021 1144   CHOLHDL 3.0 01/01/2021 1144   CHOLHDL 3 09/09/2019 1128   VLDL 72.6 (H) 09/09/2019 1128   LDLCALC 45 01/01/2021 1144   LDLDIRECT 64.0 09/09/2019 1128    Physical Exam:    VS:  BP 138/68 (BP Location: Right Arm, Patient Position: Sitting)   Pulse 90   Ht 4\' 11"  (1.499 m)   Wt 169 lb (76.7 kg)   SpO2 92%   BMI 34.13 kg/m     Wt Readings from Last 3 Encounters:  06/15/21 169 lb (76.7 kg)  06/14/21 178 lb (80.7 kg)  03/12/21 178 lb (80.7 kg)     GEN:  Well nourished, well developed in no acute distress HEENT: Normal NECK: No JVD; No carotid bruits LYMPHATICS: No lymphadenopathy CARDIAC: RRR, no murmurs, no rubs, no gallops RESPIRATORY:  Clear to auscultation without rales, wheezing or rhonchi  ABDOMEN: Soft, non-tender, non-distended MUSCULOSKELETAL:  No edema; No deformity  SKIN: Warm and dry LOWER EXTREMITIES: no swelling NEUROLOGIC:  Alert and oriented x 3 PSYCHIATRIC:  Normal affect   ASSESSMENT:    1. Coronary artery disease involving native heart with other form of angina pectoris, unspecified vessel or lesion type ([place])   2. Chronic diastolic CHF (congestive heart failure) ([place])   3. Primary hypertension   4. Mixed hyperlipidemia    PLAN:    In order of problems listed above:  Coronary disease stable from that point review on appropriate medication including antiplatelets therapy.  It  looks like she does have some stable angina pectoris and very rare situations. Chronic diastolic congestive heart failure.  She is compensated.  She takes only 20 mg of Lasix for swelling of lower extremities ask her to continue taking it on as-needed basis. Essential hypertension blood pressure well controlled continue present management. Diabetes mellitus: Not well controlled still hemoglobin A1c elevated 8.2 last time.  That being followed by internal medicine team, Dyslipidemia I did review K PN which show me her LDL of 45 HDL of 44 this is from 01/09/2021 she is already on Crestor 20 which I will continue.   Medication Adjustments/Labs and Tests Ordered: Current medicines are reviewed at length with the patient today.  Concerns regarding medicines are outlined above.  No orders of the defined types were placed in this encounter.  Medication changes: No orders of the defined types were placed in this encounter.   Signed, Park Liter, MD, [facility] 06/15/2021 10:40 AM    [place]

## 2021-06-15 NOTE — Addendum Note (Signed)
Addended by: Darrel Reach on: 06/15/2021 11:43 AM   Modules accepted: Orders

## 2021-06-20 ENCOUNTER — Telehealth: Payer: Self-pay | Admitting: *Deleted

## 2021-06-20 NOTE — Chronic Care Management (AMB) (Signed)
  Care Management   Note  06/20/2021 Name: Destiny Ballard MRN: [id number] DOB: 01/21/1951  Destiny Ballard is a 70 y.o. year old female who is a primary care patient of Copland, Gay Filler, MD and is actively engaged with the care management team. I reached out to [name] by phone today to assist with re-scheduling a follow up visit with the RN Case Manager  Follow up plan: Telephone appointment with care management team member scheduled for: 08/13/2021  Julian Hy, [place], [place] Management  Direct Dial: [phone]

## 2021-06-20 NOTE — Chronic Care Management (AMB) (Signed)
  Care Management   Note  06/20/2021 Name: SUSANNA BENGE MRN: [id number] DOB: Mar 04, 1951  GENICE KIMBERLIN is a 70 y.o. year old female who is a primary care patient of Copland, Gay Filler, MD and is actively engaged with the care management team. I reached out to [name] by phone today to assist with scheduling a follow up visit with the RN Case Manager  Follow up plan: Unsuccessful telephone outreach attempt made. A HIPAA compliant phone message was left for the patient providing contact information and requesting a return call.   Julian Hy, [place] Management  Direct Dial: [phone]

## 2021-06-21 ENCOUNTER — Ambulatory Visit (HOSPITAL_BASED_OUTPATIENT_CLINIC_OR_DEPARTMENT_OTHER)
Admission: RE | Admit: 2021-06-21 | Discharge: 2021-06-21 | Disposition: A | Discharge status: Home / Self Care | Payer: Medicare Other | Source: Ambulatory Visit | Attending: Family Medicine | Admitting: Family Medicine

## 2021-06-21 ENCOUNTER — Other Ambulatory Visit: Payer: Self-pay

## 2021-06-21 ENCOUNTER — Telehealth: Payer: Self-pay | Admitting: Cardiology

## 2021-06-21 DIAGNOSIS — M8588 Other specified disorders of bone density and structure, other site: Secondary | ICD-10-CM | POA: Diagnosis not present

## 2021-06-21 DIAGNOSIS — Z780 Asymptomatic menopausal state: Secondary | ICD-10-CM | POA: Diagnosis not present

## 2021-06-21 MED ORDER — TRULICITY 1.5 MG/0.5ML ~~LOC~~ SOAJ: 3.0000 mg | SUBCUTANEOUS | 2 refills | Status: DC

## 2021-06-21 NOTE — Telephone Encounter (Signed)
Pt c/o medication issue:  1. Name of Medication: Dulaglutide (TRULICITY) 1.5 HY/3.8OI SOPN  2. How are you currently taking this medication (dosage and times per day)?  AS DIRECTED  3. Are you having a reaction (difficulty breathing--STAT)? NO  4. What is your medication issue?  PT STATES SHE IS SUPPOSED TO BE ON THIS MEDICINE BUT THE DOSAGE IS SUPPOSED TO BE 3.0 NOT 1.5

## 2021-06-21 NOTE — Telephone Encounter (Signed)
Medication list changed as per pt request. VM left for pt that dose was adjusted.

## 2021-06-26 ENCOUNTER — Telehealth: Payer: Self-pay | Admitting: Family Medicine

## 2021-06-26 NOTE — Telephone Encounter (Signed)
See below: pt was advised to go to the ED by the triage nurse.

## 2021-06-26 NOTE — Telephone Encounter (Signed)
Pt. Called in and stated that she was having a bad cough and shortness of breathe. Transferred to triage for further advice.

## 2021-06-26 NOTE — Telephone Encounter (Signed)
Nurse Assessment Nurse: Sheppard Plumber, RN, Estill Bamberg Date/Time (Eastern Time): 06/26/2021 10:31:16 AM Confirm and document reason for call. If symptomatic, describe symptoms. ---caller states she has been coughing all week and she has copd. she has been trying to help take care of her sister that is dying of cancer Does the patient have any new or worsening symptoms? ---Yes Will a triage be completed? ---Yes Related visit to physician within the last 2 weeks? ---No Does the PT have any chronic conditions? (i.e. diabetes, asthma, this includes High risk factors for pregnancy, etc.) ---Yes List chronic conditions. ---copd Is this a behavioral health or substance abuse call? ---No Guidelines Guideline Title Affirmed Question Affirmed Notes Nurse Date/Time (Eastern Time) Cough - Chronic [1] Increasing difficulty breathing AND [2] always has some difficulty breathing Humfleet, RN, Estill Bamberg 06/26/2021 10:32:29 AM Disp. Time Eilene Ghazi Time) Disposition Final User 06/26/2021 10:28:41 AM Send to Urgent Daron Offer, Lanette PLEASE NOTE: All timestamps contained within this report are represented as [place] Standard Time. CONFIDENTIALTY NOTICE: This fax transmission is intended only for the addressee. It contains information that is legally privileged, confidential or otherwise protected from use or disclosure. If you are not the intended recipient, you are strictly prohibited from reviewing, disclosing, copying using or disseminating any of this information or taking any action in reliance on or regarding this information. If you have received this fax in error, please notify us immediately by telephone so that we can arrange for its return to [place]. Phone: [phone], Toll-Free: [phone], Fax: [phone] Page: 2 of 2 Call Id: [id number] 06/26/2021 10:35:38 AM Go to ED Now (or PCP triage) Yes Humfleet, RN, Shelly Coss Disagree/Comply Comply Caller Understands Yes PreDisposition  InappropriateToAsk Care Advice Given Per Guideline CARE ADVICE given per Cough - Chronic (Adult) guideline. GO TO ED NOW (OR PCP TRIAGE): * IF NO PCP (PRIMARY CARE PROVIDER) SECOND-LEVEL TRIAGE: You need to be seen within the next hour. Go to the [facility] at _____________ [place] as soon as you can. Comments User: Rozelle Logan, RN Date/Time Eilene Ghazi Time): 06/26/2021 10:36:28 AM oxygen 92% unsure of baseline reading. does not have range from pulmonologist. Referrals GO TO FACILITY OTHER - SPECIFY

## 2021-06-27 ENCOUNTER — Encounter: Payer: Self-pay | Admitting: Family Medicine

## 2021-06-27 DIAGNOSIS — M8580 Other specified disorders of bone density and structure, unspecified site: Secondary | ICD-10-CM | POA: Insufficient documentation

## 2021-07-04 ENCOUNTER — Telehealth: Payer: Self-pay | Admitting: *Deleted

## 2021-07-04 NOTE — Telephone Encounter (Signed)
° °  Telephone encounter was:  Unsuccessful.  07/04/2021 Name: AVEENA BARI MRN: [id number] DOB: 29-Jan-1951  Unsuccessful outbound call made today to assist with:  Home Modifications  Outreach Attempt:  1st Attempt  A HIPAA compliant voice message was left requesting a return call.  Instructed patient to call back at   Instructed patient to call back at [phone]  at their earliest convenience.   [place], Care Management  [phone] [address] , [place] [address] Email : [email or web address]

## 2021-07-04 NOTE — Telephone Encounter (Signed)
° °  Telephone encounter was:  Successful.  07/04/2021 Name: [name] MRN: [id number] DOB: Sep 17, 1950  Destiny Ballard is a 70 y.o. year old female who is a primary care patient of Copland, Gay Filler, MD . The community resource team was consulted for assistance with [place] guide performed the following interventions: Patient provided with information about care guide support team and interviewed to confirm resource needs Follow up call placed to community resources to determine status of patients referral.  Follow Up Plan:  No further follow up planned at this time. The patient has been provided with needed resources.Patient was provided information on food banks and food stamps as well as other possible community resources , patient says at this time she is good and has everything to her needs  [place], Care Management  [phone] [address] , [place] [address] Email : [email or web address]

## 2021-07-10 ENCOUNTER — Ambulatory Visit: Payer: Medicare Other | Admitting: Internal Medicine

## 2021-08-13 ENCOUNTER — Ambulatory Visit (INDEPENDENT_AMBULATORY_CARE_PROVIDER_SITE_OTHER): Payer: Medicare Other

## 2021-08-13 DIAGNOSIS — J449 Chronic obstructive pulmonary disease, unspecified: Secondary | ICD-10-CM

## 2021-08-13 DIAGNOSIS — E119 Type 2 diabetes mellitus without complications: Secondary | ICD-10-CM

## 2021-08-13 NOTE — Patient Instructions (Signed)
Visit Information  Thank you for taking time to visit with me today. Please don't hesitate to contact me if I can be of assistance to you before our next scheduled telephone appointment.  Following are the goals we discussed today:  Patient Goals/Self-Care Activities: Call Dr. Kelton Pillar to reschedule your follow up appointment Call Primary Care Provider office to make sure you have a follow up scheduled Take medications as prescribed   Attend all scheduled provider appointments Continue to follow the COPD action plan Continue to check Blood sugars per provider recommendation and take blood sugar log to office visits. Notify provider if consistently outside recommended parameters Call your provider with any Health questions or concerns Continue to work with care management team for ongoing care coordination  and health care needs Continue to work on eating a healthy diet: protein rich foods, fruits, vegetables, low salt. Cut fat from your meat and eat whole grain breads and cereals. Avoid greasy, processed food or foods that contain saturated fats and trans fats  Our next appointment is by telephone on 10/09/21 at 10:45 am  Please call the care guide team at [phone] if you need to cancel or reschedule your appointment.   If you are experiencing a Mental Health or [place] or need someone to talk to, please call the Suicide and Crisis Lifeline: 988 call 1-800-273-TALK (toll free, 24 hour hotline)   Patient verbalizes understanding of instructions and care plan provided today and agrees to view in [place]. Active MyChart status confirmed with patient.    Thea Silversmith, RN, MSN, BSN, CCM Care Management Coordinator [facility] [phone]

## 2021-08-13 NOTE — Chronic Care Management (AMB) (Signed)
Chronic Care Management   CCM RN Visit Note  08/13/2021 Name: Destiny Ballard MRN: [id number] DOB: [birth year]  Subjective: Destiny Ballard is a 71 y.o. year old female who is a primary care patient of Copland, Gay Filler, MD. The care management team was consulted for assistance with disease management and care coordination needs.    Engaged with patient by telephone for follow up visit in response to provider referral for case management and/or care coordination services.   Consent to Services:  The patient was given information about Chronic Care Management services, agreed to services, and gave verbal consent prior to initiation of services.  Please see initial visit note for detailed documentation.   Patient agreed to services and verbal consent obtained.   Assessment: Review of patient past medical history, allergies, medications, health status, including review of consultants reports, laboratory and other test data, was performed as part of comprehensive evaluation and provision of chronic care management services.   SDOH (Social Determinants of Health) assessments and interventions performed:    CCM Care Plan  Allergies  Allergen Reactions   Prednisone Other (See Comments)    REACTION: mood swings, nightmares. "Shot doesn't bother me, reaction is just with the pill" she states she has had the steroid injections before. From our records methylprednisone was given to her in 2013 without any complications.    Outpatient Encounter Medications as of 08/13/2021  Medication Sig Note   albuterol (VENTOLIN HFA) 108 (90 Base) MCG/ACT inhaler INHALE 2 PUFFS INTO THE LUNGS EVERY EIGHT HOURS AS NEEDED FOR WHEEZING OR SHORTNESS OF BREATH. (Patient taking differently: Inhale 2 puffs into the lungs every 8 (eight) hours as needed for wheezing or shortness of breath.)    aspirin EC 81 MG tablet Take 81 mg by mouth daily.    calcium carbonate (TUMS EX) 750 MG chewable tablet Chew 1 tablet by  mouth daily as needed for heartburn.    Cholecalciferol (VITAMIN D3) 1000 UNITS CAPS Take 1,000 Units by mouth daily.    dapagliflozin propanediol (FARXIGA) 10 MG TABS tablet Take 1 tablet (10 mg total) by mouth daily.    Dulaglutide (TRULICITY) 1.5 FW/2.6VZ SOPN Inject 3 mg into the skin once a week.    fexofenadine (ALLEGRA) 180 MG tablet Take 1 tablet by mouth daily.    FLUoxetine (PROZAC) 40 MG capsule TAKE 1 CAPSULE BY MOUTH EVERY DAY (Patient taking differently: Take 40 mg by mouth daily. TAKE 1 CAPSULE BY MOUTH EVERY DAY)    fluticasone (FLONASE) 50 MCG/ACT nasal spray Place 2 sprays into both nostrils as needed for allergies or rhinitis.    furosemide (LASIX) 20 MG tablet TAKE 1 TABLET BY MOUTH EVERY DAY (Patient taking differently: Take 20 mg by mouth daily.)    gabapentin (NEURONTIN) 300 MG capsule TAKE 2 CAPSULES BY MOUTH 2 TIMES DAILY. (Patient taking differently: Take 600 mg by mouth 2 (two) times daily.)    hydrALAZINE (APRESOLINE) 25 MG tablet Take 50 mg by mouth 2 (two) times daily.    hydrOXYzine (ATARAX/VISTARIL) 25 MG tablet TAKE 1/2 TO 1 TABLET BY MOUTH EVERY 8 HOURS AS NEEDED FOR ITCHING (Patient taking differently: Take 25 mg by mouth every 8 (eight) hours as needed for itching.)    Insulin Glargine (BASAGLAR KWIKPEN) 100 UNIT/ML Inject 30 Units into the skin daily.    LORazepam (ATIVAN) 0.5 MG tablet Take 1 tablet (0.5 mg total) by mouth 2 (two) times daily as needed for anxiety.    magnesium oxide (MAG-OX) 400  MG tablet Take 400 mg by mouth daily.    metFORMIN (GLUCOPHAGE-XR) 500 MG 24 hr tablet Take 2 tablets (1,000 mg total) by mouth 2 (two) times daily.    metoprolol succinate (TOPROL-XL) 100 MG 24 hr tablet TAKE 1 TABLET BY MOUTH EVERY DAY (Patient taking differently: Take 100 mg by mouth daily.)    montelukast (SINGULAIR) 10 MG tablet Take 1 tablet (10 mg total) by mouth at bedtime.    nitroGLYCERIN (NITROSTAT) 0.4 MG SL tablet Place 1 tablet (0.4 mg total) under the  tongue every 5 (five) minutes as needed for chest pain.    rosuvastatin (CRESTOR) 20 MG tablet TAKE 1 TABLET BY MOUTH EVERY DAY (Patient taking differently: Take 20 mg by mouth daily.)    Tiotropium Bromide Monohydrate (SPIRIVA RESPIMAT) 2.5 MCG/ACT AERS Inhale 2 puffs into the lungs daily. 02/26/2021: States she takes as needed.   traZODone (DESYREL) 50 MG tablet TAKE 1/2 TO 1 TABLET BY MOUTH AT BEDTIME AS NEEDED FOR SLEEP (Patient taking differently: Take 25-50 mg by mouth at bedtime as needed for sleep.)    valsartan (DIOVAN) 160 MG tablet Take 1 tablet (160 mg total) by mouth daily.    vitamin E 180 MG (400 UNITS) capsule Take 400 Units by mouth daily.    zinc gluconate 50 MG tablet Take 50 mg by mouth daily.    Insulin Pen Needle 32G X 4 MM MISC 1 Device by Does not apply route daily.    Potassium 99 MG TABS Take 1 tablet by mouth daily. (Patient not taking: Reported on 08/13/2021)    No facility-administered encounter medications on file as of 08/13/2021.    Patient Active Problem List   Diagnosis Date Noted   Osteopenia 06/27/2021   Melena 12/18/2020   History of colonic polyps 12/18/2020   Constipation 12/18/2020   Obesity    Iron deficiency anemia    Insulin resistance    Hypertension    Hyperlipidemia    Diabetes mellitus without complication ([place])    Depression    Cervical spondylosis    Anxiety    Pneumonia due to COVID-19 virus 08/21/2020   Hyperlipidemia associated with type 2 diabetes mellitus ([place]) 08/21/2020   Uncontrolled type 2 diabetes mellitus with complication, with long-term current use of insulin 03/14/2020   Type 2 diabetes mellitus with hyperglycemia, with long-term current use of insulin ([place]) 12/10/2019   Type 2 diabetes mellitus with diabetic polyneuropathy, with long-term current use of insulin ([place]) 12/10/2019   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   Tinnitus of both ears 11/18/2019   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   Eustachian tube dysfunction, bilateral 11/18/2019   Chronic eczematous otitis externa of both ears 11/18/2019   Obesity (BMI 30-39.9) 08/24/2018   Depression with anxiety 08/24/2018   CAD (coronary artery disease) 08/24/2018   Abnormal cardiovascular stress test 08/24/2018   Lobar pneumonia, unspecified organism ([place]) 11/13/2017   Precordial chest pain    Sepsis ([place]) 09/18/2017   Chronic diastolic CHF (congestive heart failure) ([place]) 09/18/2017   Diabetes mellitus ([place]) 09/08/2017   Left hip pain 10/28/2016   Chronic rhinitis 08/07/2016   COPD with acute exacerbation ([place]) 04/25/2016   Hypertension associated with diabetes ([place]) 01/30/2016   Hypokalemia 01/27/2016   HLD (hyperlipidemia)    Type 2 diabetes mellitus without complication, without long-term current use of insulin ([facility])    OSA (obstructive sleep apnea) 05/06/2014   Dyspnea on exertion 09/02/2012   Chest pain 12/10/2011  Chronic cough 05/11/2010   GERD 06/28/2008   Coronary atherosclerosis 06/28/2008    Conditions to be addressed/monitored:CHF, COPD, and DMII   Care Plan : RN Care Manager Plan of Care  Updates made by Luretha Rued, RN since 08/13/2021 12:00 AM   Problem: Chronic disease Management education and/or care coordination needs   Priority: High     Long-Range Goal: Developement of Plan of Care for Chronic Disease Management   Start Date: 08/13/2021  Expected End Date: 02/10/2022  Priority: High  Note:   Current Barriers: Ms. Bradway reports she is doing well. She reports she had a good trip to [facility] and went on a short trip to the beach with her daughter. She reports she is doing well. She denies any signs/symptoms of COPD exacerbation. She reports blood sugars "have been good"- Blood Sugar this morning was 117. Per patient, her last scheduled office visit with endocrinologist was canceled and she needs to reschedule that appointment. Cardiologist visit with Dr. Agustin Cree on 06/15/21:  Per cardiology note-CAD stable; chronic diastolic CHF compensated; HTN well controlled; mixed hyperlipidemia continue Crestor. Per cardiology note 06/15/21: instructions to take lasix as needed. Ms. Garling reports she has been taking furosemide daily. Also noted during medication review-patient reports she is not taking any form of potassium. Potassium 99 mg previously noted on medication list. Message sent to cardiologist to update re: furosemide/potassium discrepancy Knowledge Deficits related to plan of care for management of CHF, COPD, and DMII  Chronic Disease Management support and education needs related to CHF, COPD, and DMII  RNCM Clinical Goal(s):  Patient will verbalize understanding of plan for management of CHF, COPD, and DMII as evidenced by self report and/or chart notation  through collaboration with RN Care manager, provider, and care team.   Interventions: 1:1 collaboration with primary care provider regarding development and update of comprehensive plan of care as evidenced by provider attestation and co-signature Inter-disciplinary care team collaboration (see longitudinal plan of care) Evaluation of current treatment plan related to  self management and patient's adherence to plan as established by provider   Heart Failure Interventions:  (Status:  New goal.) Long Term Goal Reviewed Heart Failure Action Plan in depth and provided written copy Discussed importance of daily weight and advised patient to weigh and record daily Discussed the importance of keeping all appointments with provider Message sent to cardiologist to update re: furosemide/potassium discrepancy  COPD Interventions:  (Status:  Goal on track:  Yes.) Long Term Goal Medications reviewed. Message sent to clinical pharmacist regarding patient with cost concerns about Spriva refill Encouraged to attend provider visit as scheduled Discussed plan with patient for ongoing care management follow up and provided  patient with direct contact information for care management team.  Diabetes Interventions:  (Status:  Goal on track:  Yes.) Long Term Goal Assessed patient's understanding of A1c goal: <7% Reviewed medications with patient and discussed importance of medication adherence Encouraged to call to reschedule follow up office visit Lab Results  Component Value Date   HGBA1C 8.2 (H) 03/12/2021    Patient Goals/Self-Care Activities: Call Dr. Kelton Pillar to reschedule your follow up appointment Call Primary Care Provider office to make sure you have a follow up scheduled Take medications as prescribed   Attend all scheduled provider appointments Continue to follow the COPD action plan Continue to check Blood sugars per provider recommendation and take blood sugar log to office visits. Notify provider if consistently outside recommended parameters Call your provider with any Health questions  or concerns Continue to work with care management team for ongoing care coordination  and health care needs Continue to work on eating a healthy diet: protein rich foods, fruits, vegetables, low salt. Cut fat from your meat and eat whole grain breads and cereals. Avoid greasy, processed food or foods that contain saturated fats and trans fats   Plan:Telephone follow up appointment with care management team member scheduled for:  10/09/21 The patient has been provided with contact information for the care management team and has been advised to call with any health related questions or concerns.   Thea Silversmith, RN, MSN, BSN, CCM Care Management Coordinator [facility] [phone]

## 2021-08-14 DIAGNOSIS — E1159 Type 2 diabetes mellitus with other circulatory complications: Secondary | ICD-10-CM

## 2021-08-14 DIAGNOSIS — Z794 Long term (current) use of insulin: Secondary | ICD-10-CM | POA: Diagnosis not present

## 2021-08-14 DIAGNOSIS — I509 Heart failure, unspecified: Secondary | ICD-10-CM | POA: Diagnosis not present

## 2021-08-14 DIAGNOSIS — J449 Chronic obstructive pulmonary disease, unspecified: Secondary | ICD-10-CM

## 2021-08-15 ENCOUNTER — Other Ambulatory Visit: Payer: Self-pay

## 2021-08-15 DIAGNOSIS — I25118 Atherosclerotic heart disease of native coronary artery with other forms of angina pectoris: Secondary | ICD-10-CM

## 2021-08-16 ENCOUNTER — Other Ambulatory Visit: Payer: Self-pay | Admitting: Family Medicine

## 2021-08-16 DIAGNOSIS — E1159 Type 2 diabetes mellitus with other circulatory complications: Secondary | ICD-10-CM

## 2021-08-16 DIAGNOSIS — Z794 Long term (current) use of insulin: Secondary | ICD-10-CM

## 2021-08-19 NOTE — Progress Notes (Addendum)
[facility] [place], [place], [place] [address] [phone]  Date:  08/22/2021   Name:  Destiny Ballard   DOB:  Jul 19, 1950   MRN:  [id number]  PCP:  Darreld Mclean, MD    Chief Complaint: Follow-up (Concerns/ questions: Patient stated she fell 3 months ago Laurence Slate due /Received flu shot in august 2022)   History of Present Illness:  Destiny Ballard is a 71 y.o. very pleasant female patient who presents with the following:  Patient seen today for periodic follow-up Most recent visit with myself was in August- history of CAD with cardiology care, hypertension, CHF, sleep apnea, COPD, diabetes, GERD, hyperlipidemia  She notes that about 3 months ago she slipped going down some steps and fell onto her behind- she is still having pain over her tailbone.  Wonders if she might have a tailbone fracture -would like to have an x-ray today  Mammogram- pt declines screening today Colonoscopy was done in 2010-she would like to have a repeat colonoscopy, placed referral to GI DEXA up-to-date COVID-19 booster-recommended Flu shot is up-to-date Shingles complete Most recent lab work; July CMP, CBC.  A1c done in August, 8.2%.  Lipids June 2022  Allegra Fluoxetine Atarax as needed itching Gabapentin Trazodone at bedtime Hydralazine 25 twice daily Toprol XL 100 Valsartan 160 Crestor 20 Farxiga 10 Trulicity 3 mg Basaglar 30 units Metformin 1000 twice daily Patient Active Problem List   Diagnosis Date Noted   Osteopenia 06/27/2021   Melena 12/18/2020   History of colonic polyps 12/18/2020   Constipation 12/18/2020   Obesity    Iron deficiency anemia    Insulin resistance    Hyperlipidemia    Cervical spondylosis    Anxiety    Pneumonia due to COVID-19 virus 08/21/2020   Hyperlipidemia associated with type 2 diabetes mellitus ([place]) 08/21/2020   Type 2 diabetes mellitus with diabetic polyneuropathy, with  long-term current use of insulin ([place]) 12/10/2019   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   Tinnitus of both ears 11/18/2019   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   Eustachian tube dysfunction, bilateral 11/18/2019   Chronic eczematous otitis externa of both ears 11/18/2019   Obesity (BMI 30-39.9) 08/24/2018   Depression with anxiety 08/24/2018   CAD (coronary artery disease) 08/24/2018   Abnormal cardiovascular stress test 08/24/2018   Precordial chest pain    Sepsis ([place]) 09/18/2017   Chronic diastolic CHF (congestive heart failure) ([place]) 09/18/2017   Chronic rhinitis 08/07/2016   COPD with acute exacerbation ([place]) 04/25/2016   Hypertension associated with diabetes ([place]) 01/30/2016   OSA (obstructive sleep apnea) 05/06/2014   Dyspnea on exertion 09/02/2012   GERD 06/28/2008    Past Medical History:  Diagnosis Date   Anxiety    Prior suicide attempt   CAD (coronary artery disease)    a) s/p DES to LAD 07/2005 b) Last Myoview low risk 11/2011 showing small fixed apical perfusion defect (prior MI vs attenuation) but no ischemia - normal EF.   Cervical spondylosis    Chest pain 12/10/2011   Chronic diastolic CHF (congestive heart failure) ([facility]) 09/18/2017   Chronic eczematous otitis externa of both ears 11/18/2019   Chronic rhinitis 08/07/2016   Constipation 12/18/2020   COPD with acute exacerbation ([place]) 04/25/2016   Coronary atherosclerosis 06/28/2008   Depression    Depression with anxiety    Diabetes mellitus ([place]) 09/08/2017   Diabetes mellitus without complication (  [place])    Dyspnea on exertion 09/02/2012   CXR 07/2012:  No acute process.     Eustachian tube dysfunction, bilateral 11/18/2019   GERD 06/28/2008   GERD (gastroesophageal reflux disease)    History of colonic polyps 12/18/2020   HLD (hyperlipidemia)    Hyperlipidemia    Hyperlipidemia associated with type 2 diabetes mellitus ([place]) 08/21/2020   Hypertension    Hypertension  associated with diabetes ([place]) 01/30/2016   Hypokalemia 01/27/2016   Insulin resistance    Iron deficiency anemia    Lobar pneumonia, unspecified organism ([place]) 11/13/2017   Melena 12/18/2020   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   Obesity    Obesity (BMI 30-39.9) 08/24/2018   Last Assessment & Plan:  Exercise and weight reduction is encouraged.   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 EF 60%   CARDIAC CATHETERIZATION N/A 01/29/2016   Procedure: Left Heart Cath and Coronary Angiography;  Surgeon: Sherren Mocha, MD;  Location: [facility];  Service: Cardiovascular;  Laterality: N/A;   CERVICAL SPONDYLOSIS     SINGLE LEVEL FUSION   CHILDBIRTH     X3   CORONARY STENT PLACEMENT  07/2005   LEFT ANTERIOR DESCENDING   FOREARM FRACTURE SURGERY  2010   hand and shoulder    INCISION AND DRAINAGE BREAST ABSCESS  01/05/2012       INCISION AND DRAINAGE PERIRECTAL ABSCESS N/A 02/18/2014   Procedure: IRRIGATION AND DEBRIDEMENT PERIRECTAL ABSCESS;  Surgeon: Pedro Earls, MD;  Location: [facility];  Service: General;  Laterality: N/A;   LEFT HEART CATH AND CORONARY ANGIOGRAPHY N/A 10/10/2017   Procedure: LEFT HEART CATH AND CORONARY ANGIOGRAPHY;  Surgeon: Burnell Blanks, MD;  Location: [facility];  Service: Cardiovascular;  Laterality:  N/A;   LUMBAR LAMINECTOMY     ROTATOR CUFF REPAIR     bilaterla   TONSILLECTOMY AND ADENOIDECTOMY     TUBAL LIGATION     VIDEO BRONCHOSCOPY Bilateral 08/13/2016   Procedure: VIDEO BRONCHOSCOPY WITHOUT FLUORO;  Surgeon: Collene Gobble, MD;  Location: [facility];  Service: Cardiopulmonary;  Laterality: Bilateral;    Social History   Tobacco Use   Smoking status: Never    Passive exposure: Yes   Smokeless tobacco: Never  Vaping Use   Vaping Use: Never used  Substance Use Topics   Alcohol use: Yes    Comment: occ   Drug use: No    Family History  Problem Relation Age of Onset   Heart attack Mother    Diabetes Mother    Lung cancer Mother    Asthma Mother    Heart disease Mother    Suicidality Father        "killed himself"   Asthma Daughter        x 2   Cancer Daughter  pre-cancerous polyp   Diabetes Sister    Cancer Sister    Cervical cancer Daughter        cervical    Allergies Other        all family--seasonal allergies    Allergies  Allergen Reactions   Prednisone Other (See Comments)    REACTION: mood swings, nightmares. "Shot doesn't bother me, reaction is just with the pill" she states she has had the steroid injections before. From our records methylprednisone was given to her in 2013 without any complications.    Medication list has been reviewed and updated.  Current Outpatient Medications on File Prior to Visit  Medication Sig Dispense Refill   albuterol (VENTOLIN HFA) 108 (90 Base) MCG/ACT inhaler INHALE 2 PUFFS INTO THE LUNGS EVERY EIGHT HOURS AS NEEDED FOR WHEEZING OR SHORTNESS OF BREATH. 18 g 0   aspirin EC 81 MG tablet Take 81 mg by mouth daily.     calcium carbonate (TUMS EX) 750 MG chewable tablet Chew 1 tablet by mouth daily as needed for heartburn.     Cholecalciferol (VITAMIN D3) 1000 UNITS CAPS Take 1,000 Units by mouth daily.     dapagliflozin propanediol (FARXIGA) 10 MG TABS tablet Take 1 tablet (10 mg total) by mouth daily. 90  tablet 3   Dulaglutide (TRULICITY) 1.5 LP/3.7TK SOPN Inject 3 mg into the skin once a week. 6 mL 2   fexofenadine (ALLEGRA) 180 MG tablet Take 1 tablet by mouth daily.     FLUoxetine (PROZAC) 40 MG capsule TAKE 1 CAPSULE BY MOUTH EVERY DAY 90 capsule 1   fluticasone (FLONASE) 50 MCG/ACT nasal spray Place 2 sprays into both nostrils as needed for allergies or rhinitis.     furosemide (LASIX) 20 MG tablet TAKE 1 TABLET BY MOUTH EVERY DAY 90 tablet 2   gabapentin (NEURONTIN) 300 MG capsule TAKE 2 CAPSULES BY MOUTH 2 TIMES DAILY. 360 capsule 1   hydrALAZINE (APRESOLINE) 25 MG tablet Take 50 mg by mouth 2 (two) times daily.     hydrOXYzine (ATARAX/VISTARIL) 25 MG tablet TAKE 1/2 TO 1 TABLET BY MOUTH EVERY 8 HOURS AS NEEDED FOR ITCHING 270 tablet 3   Insulin Glargine (BASAGLAR KWIKPEN) 100 UNIT/ML Inject 30 Units into the skin daily. 30 mL 3   Insulin Pen Needle 32G X 4 MM MISC 1 Device by Does not apply route daily. 100 each 3   LORazepam (ATIVAN) 0.5 MG tablet Take 1 tablet (0.5 mg total) by mouth 2 (two) times daily as needed for anxiety. 30 tablet 1   magnesium oxide (MAG-OX) 400 MG tablet Take 400 mg by mouth daily.     metFORMIN (GLUCOPHAGE-XR) 500 MG 24 hr tablet TAKE 2 TABLETS BY MOUTH TWICE A DAY 360 tablet 3   metoprolol succinate (TOPROL-XL) 100 MG 24 hr tablet TAKE 1 TABLET BY MOUTH EVERY DAY 90 tablet 1   montelukast (SINGULAIR) 10 MG tablet Take 1 tablet (10 mg total) by mouth at bedtime. 90 tablet 1   nitroGLYCERIN (NITROSTAT) 0.4 MG SL tablet Place 1 tablet (0.4 mg total) under the tongue every 5 (five) minutes as needed for chest pain. 20 tablet 3   Potassium 99 MG TABS Take 1 tablet by mouth daily. (Patient not taking: Reported on 08/13/2021)     rosuvastatin (CRESTOR) 20 MG tablet TAKE 1 TABLET BY MOUTH EVERY DAY 90 tablet 3   Tiotropium Bromide Monohydrate (SPIRIVA RESPIMAT) 2.5 MCG/ACT AERS Inhale 2 puffs into the lungs daily. 4 g 5  traZODone (DESYREL) 50 MG tablet TAKE 1/2 TO 1  TABLET BY MOUTH AT BEDTIME AS NEEDED FOR SLEEP 90 tablet 1   valsartan (DIOVAN) 160 MG tablet Take 1 tablet (160 mg total) by mouth daily. 90 tablet 3   vitamin E 180 MG (400 UNITS) capsule Take 400 Units by mouth daily.     zinc gluconate 50 MG tablet Take 50 mg by mouth daily.     No current facility-administered medications on file prior to visit.    Review of Systems:  As per HPI- otherwise negative.   Physical Examination: Vitals:   08/22/21 0858  BP: 120/60  Pulse: 88  Resp: 16  Temp: 97.9 F (36.6 C)  SpO2: 93%   Vitals:   08/22/21 0858  Weight: 167 lb 9.6 oz (76 kg)  Height: 4\' 11"  (1.499 m)   Body mass index is 33.85 kg/m. Ideal Body Weight: Weight in (lb) to have BMI = 25: 123.5  GEN: no acute distress.  Mild obesity, looks well HEENT: Atraumatic, Normocephalic.  Ears and Nose: No external deformity. CV: RRR, No M/G/R. No JVD. No thrill. No extra heart sounds. PULM: CTA B, no wheezes, crackles, rhonchi. No retractions. No resp. distress. No accessory muscle use. ABD: S, NT, ND. No rebound. No HSM. EXTR: No c/c/e PSYCH: Normally interactive. Conversant.  Patient notes mild tenderness to palpation over her coccyx  Assessment and Plan: Hyperlipidemia, unspecified hyperlipidemia type  Coronary artery disease involving native heart with other form of angina pectoris, unspecified vessel or lesion type ([place])  Type 2 diabetes mellitus without complication, without long-term current use of insulin ([place]) - Plan: Hemoglobin W3S, Basic metabolic panel  Screening for colon cancer - Plan: Ambulatory referral to Gastroenterology  Pain, coccyx - Plan: DG Sacrum/Coccyx  Following up today on chronic health conditions as above A1c, BMP pending for diabetes monitoring She is taking Crestor for hyperlipidemia Referral to GI for colonoscopy screening She declines breast cancer screening at this time Ordered x-ray of coccyx/sacrum Plan for 52-month follow-up assuming all  is well  Signed Lamar Blinks, MD  Received x-rays and labs as below A1c shows improvement  Results for orders placed or performed in visit on 08/22/21  Hemoglobin A1c  Result Value Ref Range   Hgb A1c MFr Bld 7.3 (H) 4.6 - 6.5 %  Basic metabolic panel  Result Value Ref Range   Sodium 136 135 - 145 mEq/L   Potassium 4.1 3.5 - 5.1 mEq/L   Chloride 95 (L) 96 - 112 mEq/L   CO2 34 (H) 19 - 32 mEq/L   Glucose, Bld 116 (H) 70 - 99 mg/dL   BUN 20 6 - 23 mg/dL   Creatinine, Ser 0.70 0.40 - 1.20 mg/dL   GFR 86.51 >60.00 mL/min   Calcium 9.9 8.4 - 10.5 mg/dL    DG Sacrum/Coccyx  Result Date: 08/23/2021 CLINICAL DATA:  Fall 3 months ago with persistent sacral pain, initial encounter EXAM: SACRUM AND COCCYX - 2+ VIEW COMPARISON:  None. FINDINGS: Pelvic ring is intact. Sacral ala are within normal limits. No definitive sacral or coccygeal fracture is seen. No soft tissue abnormality is noted. IMPRESSION: No acute abnormality seen. Electronically Signed   By: Inez Catalina M.D.   On: 08/23/2021 11:13

## 2021-08-19 NOTE — Patient Instructions (Addendum)
It was nice to see you again today -I will will be in touch with your labs and x-rays We will get you set up with GI for a colonoscopy  Please see me in about 6 months assuming all is well If you have not had a covid booster in the last 6 months or so would recommend a booster at your pharmacy

## 2021-08-20 ENCOUNTER — Ambulatory Visit (INDEPENDENT_AMBULATORY_CARE_PROVIDER_SITE_OTHER): Payer: Medicare Other | Admitting: Pharmacist

## 2021-08-20 DIAGNOSIS — J449 Chronic obstructive pulmonary disease, unspecified: Secondary | ICD-10-CM

## 2021-08-20 DIAGNOSIS — J440 Chronic obstructive pulmonary disease with acute lower respiratory infection: Secondary | ICD-10-CM

## 2021-08-20 DIAGNOSIS — J209 Acute bronchitis, unspecified: Secondary | ICD-10-CM

## 2021-08-20 DIAGNOSIS — E119 Type 2 diabetes mellitus without complications: Secondary | ICD-10-CM

## 2021-08-20 MED ORDER — SPIRIVA RESPIMAT 2.5 MCG/ACT IN AERS: 2.0000 | INHALATION_SPRAY | Freq: Every day | RESPIRATORY_TRACT | 5 refills | Status: DC

## 2021-08-20 NOTE — Patient Instructions (Signed)
Destiny Ballard It was a pleasure speaking with you today.  I have attached a summary of our visit today and information about your health goals.    If you have any questions or concerns, please feel free to contact me either at the phone number below or with a MyChart message.   Keep up the good work!  [name], PharmD Clinical Pharmacist [facility] [phone] (direct line)  [phone] (main office number)  Current Barriers:  Patient states Stiolto copay is high especially with other medications she takes that have brand / high copays Unable to achieve control of hypertension (high blood pressure) and diabetes  Reports high cost of Farxiga and Trulicity  Chronic Obstructive Pulmonary Disease: Pharmacist Clinical Goal(s):  Over the next 90 days, patient will continue to adhere to current inhaler regimen and identify potential cost savings through collaboration with PharmD and provider.  Current treatment:  Spiriva 2.5mg  - 1 inhalation once a day;  Albuterol inhaler as needed for shortness of breath and wheezing;  montelukast 10mg  daily at bedtime;  benzonatate 200mg  up to 3 times a day as needed for cough.  Interventions:  Recommended continue current regimen.  Provided discount coupon for Spiriva and updated prescription for Spiriva at [facility] Patient self care activities - Over the next 90 days patient will: Take medications as prescribed Collaborate with provider on medication access solutions  Hypertension / High Blood Pressure: Pharmacist Clinical Goal(s):  Over the next 90 days, patient will achieve adherence to monitoring guidelines and medication adherence to achieve therapeutic efficacy. Also will achieve control of blood pressure as evidenced by home BP readings < 140/90 through collaboration with PharmD and provider.  Current treatment:  Valsartan 160mg  daily metoprolol succinate 100mg  daily hydralazine 25mg  -Take 2 tablets =  50mg  twice a day  furosemide 20mg  daily Interventions: Educated on BP goals and improtance of obtaining BP goals to decrease risk of stroke and renal disease Reviewed refill history with pharmacy Patient Self-Care Activities - Over the next 180 days patient will:  Focus on medication adherence by taking all medications as prescribed Check blood pressure 2 to 3 times per week, document, and provide at future appointments  Diabetes: Pharmacist Clinical Goal(s):  Over the next 90 days, patient will achieve adherence to monitoring guidelines and medication adherence to achieve therapeutic efficacy Achieve control of diabetes as evidenced by meeting goal of A1c <7.0% through collaboration with PharmD and provider.  Lab Results  Component Value Date   HGBA1C 8.2 (H) 03/12/2021   Current treatment: Farxiga 10mg  daily Basaglar 30 units daily Trulicity 1.5mg  weekly metformin ER 500mg  - take 2 tablets twice a day Interventions: Educated on importance of taking all medication for diabetes as prescribed.  Provided discount cards for Cruzita Lederer and Trulicity  Discussed taking above medication regimen as prescribed to reach A1c goal Reviewed signs and symptoms of hypoglycemia and how to treat.  Patient Self-Care Activities - Over the next 180 days patient will:  Focus on medication adherence by taking all medications as prescribed Check blood glucose 2 times per day; document, and provide at future appointments Collaborate with clinical pharmacist and provider on medication access solutions. Provided discount coupons for Farxiga, Trulicity and [employer], [employer] (max copay $25)   Depression with Anxiety:  Pharmacist Clinical Goal(s):  Over the next 90 days, patient will continue to adhere to medication regimen for depression and anxiety while minimizing potential side effects Current treatment:  trazodone 50mg  - take 0.5 tablet =  25mg  at bedtime as needed fluoxetine 40mg  daily Interventions:   Recommend continue current regimen for depression and anxiety. Patient self care activities - Over the next 90 days patient will: Take medications as prescribed  Hyperlipidemia / CAD:  Pharmacist Clinical Goal(s):  Over the next 90 days, patient will maintain LDL of <70 Current treatment:  Rosuvastatin 20mg  daily  Aspirin 81mg  daily Interventions:  Discussed following diet low in sugar and saturated fat.  Discussed improtance of taking statin in prevention of cardiovascular disease Patient self care activities - Over the next 90 days patient will: Continue current medications as prescribed for heart / cholesterol   Medication Manag ement: Pharmacist Clinical Goal(s): Over the next 90 days, patient will take medications as prescribed Current pharmacy[facility] Interventions:  Provided  discount cards for Spiriva, Trulicity, Farxiga and Basaglar  Patient self care activities - Over the next 90 days patient will: Take discount cards to your pharmacy to use to lower cost of above medications Contact provider's office or clinical pharmacist if you have an problems of questions about medications or medication costs.  Will pick up printed copy in office 08/22/2021

## 2021-08-20 NOTE — Chronic Care Management (AMB) (Signed)
Chronic Care Management Pharmacy Note  08/20/2021 Name:  Destiny Ballard MRN:  [id number] DOB:  [birth year]  Summary:  I previously worked with patient in 2022 to try to find assistance solutions for medication costs. She was provided with medication discount cards. She has Teacher, adult education thru [employer]. I am not sure that patient ever used copay cards provided. She was lost to follow up due to not returning phone calls from clinical pharmacist.  Patient was referred back by Chronic Care Management nurse care manager. She has appointment for next week but she was out of Spiriva so I looked for cost solutions today. [facility].  Also left additional discounts cards for [name] and [name] for patient to pick up when she is in office 08/22/2021.   Subjective: Destiny Ballard is an 71 y.o. year old female who is a primary patient of Ballard, Destiny Filler, MD.  The CCM team was consulted for assistance with disease management and care coordination needs.    Collaboration with pharmacy and manufacturer for discount card  for  medication access solutions  in response to provider referral for pharmacy case management and/or care coordination services.   Consent to Services:  The patient was given information about Chronic Care Management services, agreed to services, and gave verbal consent prior to initiation of services.  Please see initial visit note for detailed documentation.   Patient Care Team: Ballard, Destiny Filler, MD as PCP - General (Family Medicine) Brien Few, MD as Consulting Physician (Obstetrics and Gynecology) Luretha Rued, RN as Case Manager [name], RPH-CPP (Pharmacist)  Recent office visits: 03/12/2021 - PCP  (Dr Lorelei Pont) Received her A1c 9/2- increased her trulicity to 3 mg at next refill Recheck in 4 months 02/09/2021 - PCP (Dr Lorelei Pont) Diarrhea / nausea. Prescribed  ondansetron 3m as needed 11/27/2020 - PCP (Dr CLorelei Pont F/U urgent care visit for headaches, hansues and numbness of left arm. No med changes; referred back to cardio  Recent consult visits: 06/15/2021 - Cardio (Dr KAgustin Cree 01/09/2021 - Endo (Dr SKelton Pillar A1c improved. No med changes. Recommended tighter diet adherence.  01/01/2021 - Cardio (dr KAgustin Cree F/U CAD. No med changes. ECHO and Lexiscan ordered. Given nitroglycerin. Declined admission. F/U with cardio.  Hospital visits: 11/18/2020 - ED visit for precordial / chest pain  Objective:  Lab Results  Component Value Date   CREATININE 0.53 (L) 02/09/2021   CREATININE 0.61 11/18/2020   CREATININE 0.62 09/04/2020    Lab Results  Component Value Date   HGBA1C 8.2 (H) 03/12/2021   Last diabetic Eye exam:  Lab Results  Component Value Date/Time   HMDIABEYEEXA No Retinopathy 03/08/2021 12:00 AM    Last diabetic Foot exam: No results found for: HMDIABFOOTEX      Component Value Date/Time   CHOL 130 01/01/2021 1144   TRIG 266 (H) 01/01/2021 1144   HDL 44 01/01/2021 1144   CHOLHDL 3.0 01/01/2021 1144   CHOLHDL 3 09/09/2019 1128   VLDL 72.6 (H) 09/09/2019 1128   LDLCALC 45 01/01/2021 1144   LDLDIRECT 64.0 09/09/2019 1128    Hepatic Function Latest Ref Rng & Units 02/09/2021 11/18/2020 09/04/2020  Total Protein 6.0 - 8.5 g/dL 6.3 7.2 6.6  Albumin 3.7 - 4.7 g/dL 3.9 3.6 3.7  AST 0 - 40 IU/L 14 20 19   ALT 0 - 32 IU/L 11 20 28   Alk Phosphatase 44 - 121 IU/L 82 78  101  Total Bilirubin 0.0 - 1.2 mg/dL <0.2 0.3 0.6  Bilirubin, Direct 0.0 - 0.2 mg/dL - - -    Lab Results  Component Value Date/Time   TSH 2.361 01/27/2016 02:52 AM   TSH 0.83 03/24/2014 11:14 AM   TSH 0.88 09/10/2013 02:41 PM    CBC Latest Ref Rng & Units 02/09/2021 11/18/2020 09/04/2020  WBC 3.4 - 10.8 x10E3/uL 10.5 10.8(H) 9.0  Hemoglobin 11.1 - 15.9 g/dL 12.0 12.6 12.1  Hematocrit 34.0 - 46.6 % 39.9 39.6 37.6  Platelets 150 - 450 x10E3/uL 265 287 236.0     Lab Results  Component Value Date/Time   VD25OH 26 (L) 09/16/2011 10:58 AM    Clinical ASCVD: Yes  The 10-year ASCVD risk score (Arnett DK, et al., 2019) is: 42.7%   Values used to calculate the score:     Age: 14 years     Sex: Female     Is Non-Hispanic African American: No     Diabetic: Yes     Tobacco smoker: Yes     Systolic Blood Pressure: 440 mmHg     Is BP treated: Yes     HDL Cholesterol: 44 mg/dL     Total Cholesterol: 130 mg/dL    Social History   Tobacco Use  Smoking Status Never   Passive exposure: Yes  Smokeless Tobacco Never   BP Readings from Last 3 Encounters:  06/15/21 138/68  03/12/21 (!) 142/82  02/09/21 (!) 144/70   Pulse Readings from Last 3 Encounters:  06/15/21 90  03/12/21 93  02/09/21 95   Wt Readings from Last 3 Encounters:  06/15/21 169 lb (76.7 kg)  06/14/21 178 lb (80.7 kg)  03/12/21 178 lb (80.7 kg)    Assessment: Review of patient past medical history, allergies, medications, health status, including review of consultants reports, laboratory and other test data, was performed as part of comprehensive evaluation and provision of chronic care management services.   SDOH:  (Social Determinants of Health) assessments and interventions performed:  SDOH Interventions    Flowsheet Row Most Recent Value  SDOH Interventions   Financial Strain Interventions --  [providing discount cards for branded meds.]       CCM Care Plan  Allergies  Allergen Reactions   Prednisone Other (See Comments)    REACTION: mood swings, nightmares. "Shot doesn't bother me, reaction is just with the pill" she states she has had the steroid injections before. From our records methylprednisone was given to her in 2013 without any complications.    Medications Reviewed Today     Reviewed by [name], RPH-CPP (Pharmacist) on 08/20/21 at 1243  Med List Status: <None>   Medication Order Taking? Sig Documenting Provider Last Dose Status Informant   albuterol (VENTOLIN HFA) 108 (90 Base) MCG/ACT inhaler [id number] No INHALE 2 PUFFS INTO THE LUNGS EVERY EIGHT HOURS AS NEEDED FOR WHEEZING OR SHORTNESS OF BREATH. Jonetta Osgood, MD Taking Active   aspirin EC 81 MG tablet [id number] No Take 81 mg by mouth daily. [provider] Taking Active Self  calcium carbonate (TUMS EX) 750 MG chewable tablet [id number] No Chew 1 tablet by mouth daily as needed for heartburn. [provider] Taking Active   Cholecalciferol (VITAMIN D3) 1000 UNITS CAPS [id number] No Take 1,000 Units by mouth daily. [provider] Taking Active Self  dapagliflozin propanediol (FARXIGA) 10 MG TABS tablet [id number] No Take 1 tablet (10 mg total) by mouth daily. Shamleffer, Melanie Crazier, MD Taking Active  Dulaglutide (TRULICITY) 1.5 FV/4.3KG SOPN [id number] No Inject 3 mg into the skin once a week. Park Liter, MD Taking Active   fexofenadine [facility]) 180 MG tablet [id number] No Take 1 tablet by mouth daily. [provider] Taking Active Self  FLUoxetine (PROZAC) 40 MG capsule [id number] No TAKE 1 CAPSULE BY MOUTH EVERY DAY Ballard, Destiny Filler, MD Taking Active   fluticasone (FLONASE) 50 MCG/ACT nasal spray [id number] No Place 2 sprays into both nostrils as needed for allergies or rhinitis. [provider] Taking Active   furosemide (LASIX) 20 MG tablet [id number] No TAKE 1 TABLET BY MOUTH EVERY DAY Burtis Junes, NP Taking Active   gabapentin (NEURONTIN) 300 MG capsule [id number] No TAKE 2 CAPSULES BY MOUTH 2 TIMES DAILY. Ballard, Destiny Filler, MD Taking Active   hydrALAZINE (APRESOLINE) 25 MG tablet [id number] No Take 50 mg by mouth 2 (two) times daily. [provider] Taking Active   hydrOXYzine (ATARAX/VISTARIL) 25 MG tablet [id number] No TAKE 1/2 TO 1 TABLET BY MOUTH EVERY 8 HOURS AS NEEDED FOR ITCHING Ballard, Destiny Filler, MD Taking Active            Med Note Kenton Kingfisher, ABBIE R   Mon Jan 01, 2021 10:53 AM)    Insulin  Glargine (BASAGLAR KWIKPEN) 100 UNIT/ML [id number] No Inject 30 Units into the skin daily. Shamleffer, Melanie Crazier, MD Taking Active   Insulin Pen Needle 32G X 4 MM MISC [id number] No 1 Device by Does not apply route daily. Shamleffer, Melanie Crazier, MD Taking Active   LORazepam (ATIVAN) 0.5 MG tablet [id number] No Take 1 tablet (0.5 mg total) by mouth 2 (two) times daily as needed for anxiety. Roma Schanz R, DO Taking Active   magnesium oxide (MAG-OX) 400 MG tablet [id number] No Take 400 mg by mouth daily. [provider] Taking Active Self  metFORMIN (GLUCOPHAGE-XR) 500 MG 24 hr tablet [id number]  TAKE 2 TABLETS BY MOUTH TWICE A DAY Ballard, Jessica C, MD  Active   metoprolol succinate (TOPROL-XL) 100 MG 24 hr tablet [id number] No TAKE 1 TABLET BY MOUTH EVERY DAY Ballard, Destiny Filler, MD Taking Active   montelukast (SINGULAIR) 10 MG tablet [id number] No Take 1 tablet (10 mg total) by mouth at bedtime. Ballard, Destiny Filler, MD Taking Active   nitroGLYCERIN (NITROSTAT) 0.4 MG SL tablet [id number] No Place 1 tablet (0.4 mg total) under the tongue every 5 (five) minutes as needed for chest pain. Ballard, Destiny Filler, MD Taking Active   Potassium 99 MG TABS [id number] No Take 1 tablet by mouth daily.  Patient not taking: Reported on 08/13/2021   [provider] Not Taking Active Self  rosuvastatin (CRESTOR) 20 MG tablet [id number] No TAKE 1 TABLET BY MOUTH EVERY DAY Ballard, Destiny Filler, MD Taking Active   Tiotropium Bromide Monohydrate (SPIRIVA RESPIMAT) 2.5 MCG/ACT AERS [id number] No Inhale 2 puffs into the lungs daily. Colon Branch, MD Taking Active            Med Note Anselm Lis Feb 26, 2021  2:27 PM) States she takes as needed.  traZODone (DESYREL) 50 MG tablet [id number] No TAKE 1/2 TO 1 TABLET BY MOUTH AT BEDTIME AS NEEDED FOR SLEEP Ballard, Destiny Filler, MD Taking Active Self  valsartan (DIOVAN) 160 MG tablet [id number] No Take 1 tablet (160 mg total) by mouth daily. Ballard,  Destiny Filler, MD Taking Active   vitamin E 180 MG (400 UNITS) capsule [id number] No Take 400 Units by mouth daily.  [provider] Taking Active   zinc gluconate 50 MG tablet [id number] No Take 50 mg by mouth daily. [provider] Taking Active Self  Med List Note (Ballard, Destiny Filler, MD 03/12/21 1526): Lorazepam rarely.            Patient Active Problem List   Diagnosis Date Noted   Osteopenia 06/27/2021   Melena 12/18/2020   History of colonic polyps 12/18/2020   Constipation 12/18/2020   Obesity    Iron deficiency anemia    Insulin resistance    Hyperlipidemia    Cervical spondylosis    Anxiety    Pneumonia due to COVID-19 virus 08/21/2020   Hyperlipidemia associated with type 2 diabetes mellitus ([place]) 08/21/2020   Type 2 diabetes mellitus with diabetic polyneuropathy, with long-term current use of insulin ([place]) 12/10/2019   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   Tinnitus of both ears 11/18/2019   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   Eustachian tube dysfunction, bilateral 11/18/2019   Chronic eczematous otitis externa of both ears 11/18/2019   Obesity (BMI 30-39.9) 08/24/2018   Depression with anxiety 08/24/2018   CAD (coronary artery disease) 08/24/2018   Abnormal cardiovascular stress test 08/24/2018   Precordial chest pain    Sepsis ([place]) 09/18/2017   Chronic diastolic CHF (congestive heart failure) ([place]) 09/18/2017   Chronic rhinitis 08/07/2016   COPD with acute exacerbation ([place]) 04/25/2016   Hypertension associated with diabetes ([place]) 01/30/2016   OSA (obstructive sleep apnea) 05/06/2014   Dyspnea on exertion 09/02/2012   GERD 06/28/2008    Immunization History  Administered Date(s) Administered   Fluad Quad(high Dose 65+) 09/04/2020   Influenza Whole 04/14/2010   Influenza, High Dose Seasonal PF 04/03/2017, 05/11/2018, 03/05/2019, 03/12/2021   Influenza,inj,Quad PF,6+ Mos 05/10/2013, 04/05/2014,  05/22/2015, 04/25/2016   PFIZER Comirnaty([place])Covid-19 Tri-Sucrose Vaccine 01/11/2021, 02/01/2021   Pneumococcal Conjugate-13 07/24/2016   Pneumococcal Polysaccharide-23 04/05/2014, 05/22/2015   Tdap 07/11/2014, 03/05/2019   Zoster Recombinat (Shingrix) 03/05/2019, 06/05/2019    Conditions to be addressed/monitored: CAD, HTN, HLD, COPD, DMII, and Depression  Care Plan : General Pharmacy (Adult)  Updates made by [name], RPH-CPP since 08/20/2021 12:00 AM     Problem: Medication Adherence (Wellness)      Long-Range Goal: Medication Adherence Maintained   Recent Progress: On track  Note:   Current Barriers:  Unable to independently afford treatment regimen Unable to achieve control of type 2 DM, HTN   Pharmacist Clinical Goal(s):  Over the next 90 days, patient will achieve adherence to monitoring guidelines and medication adherence to achieve therapeutic efficacy achieve control of diabetes and HTN as evidenced by meeting goal as described in patient's care plan,  through collaboration with PharmD and provider.   Interventions: 1:1 collaboration with Ballard, Destiny Filler, MD regarding development and update of comprehensive plan of care as evidenced by provider attestation and co-signature Inter-disciplinary care team collaboration (see longitudinal plan of care) Comprehensive medication review performed; medication list updated in electronic medical record  Diabetes: Uncontrolled but improving; A1c decreased from 9.3% to 8.3%; Goal A1c is < 7.0% Patient will see PCP today and have A1c rechecked Current treatment: Farxiga 42m daily Basaglar 30 units daily Trulicity 16.9SWweekly  metformin ER 5047m- take 2 tablets twice a day Patient reports she will occasionally skip Basaglar dose due to concern that BG will drop too low. She reports if BG is 90 to 120 in morning she will skip Basaglar.  She states she is taking  all other meds as prescribed but pharmacy confirmed she  has not filled [place] in 2022 yet. She had a bottle with about 11 tablets of Farxiga with her today. I could not tell when she last filled because she uses the same bottles over and over because they have her "notes" on them  Current glucose readings: 114 to 150 fasting Denies hypoglycemic/hyperglycemic symptoms Interventions: (addressed at previous visit)  Educated on importance of taking all medication for diabetes as prescribed (A1c is improved - could be at goal with improved adhernece)  Education provided about long acting insulin and risk of hypoglycemia. Recommended that if she is concerned about BG dropping too low to lower dose of Basaglar by half instead of skipping altogether.  Reviewed signs and symptoms of hypoglycemia and how to treat.  If A1c is not at goal in future, could increase dose of Trulicity to 3 or 4.5mg  weekly   Hypertension: Controlled; BP goal 140/90 Current treatment:  hydralazine 25mg   - take 2 tablets = 50mg  twice a day furosemide 20mg  daily (not taking) Valsartan 160mg  daily  metoprolol succinate 100mg  daily. Current home readings: patient did not have numbers to report Verified that she has stopped losartan. She understands that valsartan replaced losartan Denied edema or SOB.  Interventions: (addressed at previous visit) Reviewed BP goals and improtance of obtaining BP goals to decrease risk of stroke and renal disease.  Will let Dr Lorelei Pont know patient is no taking furosemide.   COPD: Controlled; Goal: prevent exacerbations and hospitalizations     Current treatment:  Spiriva 2.5mg  - 1 inhalation once a day (not taking)  Albuterol inhaler as needed for shortness of breath and wheezing;  montelukast 10mg  daily at bedtime;  one exacerbation requiring treatment in the last 6 months (related to COVID) Has not been filling Spiriva on time Uses rescue inhaler - once per month Maintenance inhaler - not using regularly due to cost Intervention:  Called  manufacturer to get information on Spiriva discount care. [facility] (ID: [id number] grp: [phone] / BIN# [id number] / PCN# [id number] ([id number]) Patient has mail carriers insurance for medications and should be able to use discount cards.  Continue to use albuterol as needed  Depression with Anxiety:  Controlled Current therapy Trazodone 50mg  - take 0.5 tablet = 25mg  at bedtime as needed Fluoxetine 40mg  daily Recommend continue current regimen for depression and anxiety.  Hyperlipidemia / CAD:  Controlled; LDL goal <70 current therapy: rosuvastatin 20mg  daily ASA 81mg  daily Interventions: (addressed at previous visit)  Discussed improtance of taking statin in prevention of cardiovascular disease Patient declined need for refill for rosuvastatin today. Plan to continue to monitor adherence.  Discussed limiting intake of saturated and trans fat   Medication Management: Patient has Pharmacist, community with [employer] through previous job - Administrator, [employer] so she can use discount card from manufacturer to lower copay. Reviewed current branded medications. Identified discount cards and printed information for patient. Left information in envelope for her to pick up when she is in office 08/22/2021 [facility] Interventions:  Provided  discount cards for Spiriva, Trulicity, Farxiga and Basaglar  Patient self care activities - Over the next 90 days patient will: Take discount cards to your pharmacy to use to lower cost of above medications Contact provider's office or clinical pharmacist if you have an problems of questions about medications or medication costs.      Medication Assistance: Provided coupons to lower copay for Spiriva,  [name] IT trainer   [facility]  [id number] [place] [place]Phone: [phone]:  [phone] [id number] [address]: [phone]: [phone]  Follow Up:  Patient agrees to Care Plan and Follow-up.  Plan: Telephone follow up appointment with care management team member scheduled for:  1 to 2 weeks.   TaCherre RobinsPharmD Clinical Pharmacist [place] [facility] [place]

## 2021-08-22 ENCOUNTER — Other Ambulatory Visit: Payer: Self-pay

## 2021-08-22 ENCOUNTER — Ambulatory Visit (HOSPITAL_BASED_OUTPATIENT_CLINIC_OR_DEPARTMENT_OTHER)
Admission: RE | Admit: 2021-08-22 | Discharge: 2021-08-22 | Disposition: A | Discharge status: Home / Self Care | Payer: Medicare Other | Source: Ambulatory Visit | Attending: Family Medicine | Admitting: Family Medicine

## 2021-08-22 ENCOUNTER — Ambulatory Visit (INDEPENDENT_AMBULATORY_CARE_PROVIDER_SITE_OTHER): Payer: Medicare Other | Admitting: Family Medicine

## 2021-08-22 VITALS — BP 120/60 | HR 88 | Temp 97.9°F | Resp 16 | Ht 59.0 in | Wt 167.6 lb

## 2021-08-22 DIAGNOSIS — M533 Sacrococcygeal disorders, not elsewhere classified: Secondary | ICD-10-CM | POA: Diagnosis not present

## 2021-08-22 DIAGNOSIS — E785 Hyperlipidemia, unspecified: Secondary | ICD-10-CM

## 2021-08-22 DIAGNOSIS — E119 Type 2 diabetes mellitus without complications: Secondary | ICD-10-CM | POA: Diagnosis not present

## 2021-08-22 DIAGNOSIS — Z1211 Encounter for screening for malignant neoplasm of colon: Secondary | ICD-10-CM | POA: Diagnosis not present

## 2021-08-22 DIAGNOSIS — I25118 Atherosclerotic heart disease of native coronary artery with other forms of angina pectoris: Secondary | ICD-10-CM

## 2021-08-22 LAB — HEMOGLOBIN A1C: Hgb A1c MFr Bld: 7.3 % — ABNORMAL HIGH (ref 4.6–6.5)

## 2021-08-23 ENCOUNTER — Telehealth: Payer: Self-pay | Admitting: Pharmacist

## 2021-08-23 ENCOUNTER — Encounter: Payer: Self-pay | Admitting: Gastroenterology

## 2021-08-23 ENCOUNTER — Encounter: Payer: Self-pay | Admitting: Family Medicine

## 2021-08-23 LAB — BASIC METABOLIC PANEL
BUN: 20 mg/dL (ref 6–23)
CO2: 34 mEq/L — ABNORMAL HIGH (ref 19–32)
Calcium: 9.9 mg/dL (ref 8.4–10.5)
Chloride: 95 mEq/L — ABNORMAL LOW (ref 96–112)
Creatinine, Ser: 0.7 mg/dL (ref 0.40–1.20)
GFR: 86.51 mL/min (ref >60.00)
Glucose, Bld: 116 mg/dL — ABNORMAL HIGH (ref 70–99)
Potassium: 4.1 mEq/L (ref 3.5–5.1)
Sodium: 136 mEq/L (ref 135–145)

## 2021-08-23 NOTE — Telephone Encounter (Signed)
Called pharmacy to check cost of Spiriva. Sent in Rx with discount card information. [facility] cost [facility] 30 days of spiriva was $147 with copay card and was $600 prior to using copay card.  Researched her [employer] plan to see if maybe Spiriva was non formulary but it was on formulary. Check further and it appears that [facility] is $146.60. But is she gets 3 months supply cost drops to $40.  [facility] and had [facility] run Spiriva for 3 months and indeed her copay dropped to $40.  They will fill for patient.  Patient notified

## 2021-08-30 ENCOUNTER — Ambulatory Visit: Payer: Medicare Other | Admitting: Pharmacist

## 2021-08-30 DIAGNOSIS — I25118 Atherosclerotic heart disease of native coronary artery with other forms of angina pectoris: Secondary | ICD-10-CM

## 2021-08-30 DIAGNOSIS — E1159 Type 2 diabetes mellitus with other circulatory complications: Secondary | ICD-10-CM

## 2021-08-30 DIAGNOSIS — I152 Hypertension secondary to endocrine disorders: Secondary | ICD-10-CM

## 2021-08-30 DIAGNOSIS — E119 Type 2 diabetes mellitus without complications: Secondary | ICD-10-CM

## 2021-08-30 DIAGNOSIS — E785 Hyperlipidemia, unspecified: Secondary | ICD-10-CM

## 2021-08-30 DIAGNOSIS — J302 Other seasonal allergic rhinitis: Secondary | ICD-10-CM

## 2021-08-30 DIAGNOSIS — J449 Chronic obstructive pulmonary disease, unspecified: Secondary | ICD-10-CM

## 2021-08-30 MED ORDER — HYDRALAZINE HCL 25 MG PO TABS: 50.0000 mg | ORAL_TABLET | Freq: Two times a day (BID) | ORAL | 1 refills | Status: DC

## 2021-08-30 MED ORDER — ROSUVASTATIN CALCIUM 20 MG PO TABS: 20.0000 mg | ORAL_TABLET | Freq: Every day | ORAL | 3 refills | Status: DC

## 2021-08-30 MED ORDER — MONTELUKAST SODIUM 10 MG PO TABS: 10.0000 mg | ORAL_TABLET | Freq: Every day | ORAL | 1 refills | Status: DC

## 2021-08-30 MED ORDER — BASAGLAR KWIKPEN 100 UNIT/ML ~~LOC~~ SOPN: 30.0000 [IU] | PEN_INJECTOR | Freq: Every day | SUBCUTANEOUS | 1 refills | Status: DC

## 2021-08-30 NOTE — Chronic Care Management (AMB) (Signed)
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 Reviewed her plan with her and branded meds should be $40/90 days and generic $10 / 90 days. (Its unusual but some of her medications cost more for 30 days on this plan) I also realized prescriptions have been [facility] [place] [facility] [address]. The meds with low adherence - rosuvastatin, hydralazine and montelukast were at [facility] [address]. Patient prefers to [facility]. Updated prescriptions send in for her.   Subjective: Destiny Ballard is an 71 y.o. year old female who is a primary patient of Copland, Gay Filler, MD.  The CCM team was consulted for assistance with disease management and care coordination needs.    Collaboration with patient  for  follow up  in response to provider referral for pharmacy case management and/or care coordination services.   Consent to Services:  The patient was given information about Chronic Care Management services, agreed to services, and gave verbal consent prior to initiation of services.  Please see initial visit note for detailed documentation.   Patient Care Team: Copland, Gay Filler, MD as PCP - General (Family Medicine) Brien Few, MD as Consulting Physician (Obstetrics and Gynecology) Luretha Rued, RN as Case Manager [name], RPH-CPP (Pharmacist)  Recent office visits: 08/20/2021 - PCP (Dr Lorelei Pont) Seen for follow up of chronic conditions and pain over her tailbone for 3 months after fall on step. Declined mammogram; referred to GI for colonoscopy. Labs and xray of coccyn ordered.  03/12/2021 - PCP  (Dr Lorelei Pont) Received her A1c 9/2- increased her trulicity to 3 mg at next refill Recheck in 4 months 02/09/2021 - PCP (Dr Lorelei Pont) Diarrhea / nausea. Prescribed ondansetron 357m as needed 11/27/2020 - PCP (Dr CLorelei Pont F/U urgent care visit  for headaches, hansues and numbness of left arm. No med changes; referred back to cardio  Recent consult visits: 06/15/2021 Cardio (Dr KAgustin Cree F/U CAD and CHF. No med changes mentioned in note but Trulicity dose adjusted on med list from 339mweekly to 1.57m48meekly  01/09/2021 - Endo (Dr ShaKelton Pillar1c improved. No med changes. Recommended tighter diet adherence.  01/01/2021 - Cardio (dr KraAgustin Cree/U CAD. No med changes. ECHO and Lexiscan ordered. Given nitroglycerin. Declined admission. F/U with cardio.  Hospital visits: 11/18/2020 - ED visit for precordial / chest pain  Objective:  Lab Results  Component Value Date   CREATININE 0.70 08/22/2021   CREATININE 0.53 (L) 02/09/2021   CREATININE 0.61 11/18/2020    Lab Results  Component Value Date   HGBA1C 7.3 (H) 08/22/2021   Last diabetic Eye exam:  Lab Results  Component Value Date/Time   HMDIABEYEEXA No Retinopathy 03/08/2021 12:00 AM    Last diabetic Foot exam: No results found for: HMDIABFOOTEX      Component Value Date/Time   CHOL 130 01/01/2021 1144   TRIG 266 (H) 01/01/2021 1144   HDL 44 01/01/2021 1144   CHOLHDL 3.0 01/01/2021 1144   CHOLHDL 3 09/09/2019 1128   VLDL 72.6 (H) 09/09/2019 1128   LDLCALC 45 01/01/2021 1144   LDLDIRECT 64.0 09/09/2019 1128    Hepatic Function Latest Ref Rng & Units 02/09/2021 11/18/2020 09/04/2020  Total Protein 6.0 - 8.5 g/dL 6.3 7.2 6.6  Albumin 3.7 - 4.7 g/dL 3.9 3.6 3.7  AST 0 - 40 IU/L 14 20 19   ALT 0 - 32  IU/L 11 20 28   Alk Phosphatase 44 - 121 IU/L 82 78 101  Total Bilirubin 0.0 - 1.2 mg/dL <0.2 0.3 0.6  Bilirubin, Direct 0.0 - 0.2 mg/dL - - -    Lab Results  Component Value Date/Time   TSH 2.361 01/27/2016 02:52 AM   TSH 0.83 03/24/2014 11:14 AM   TSH 0.88 09/10/2013 02:41 PM    CBC Latest Ref Rng & Units 02/09/2021 11/18/2020 09/04/2020  WBC 3.4 - 10.8 x10E3/uL 10.5 10.8(H) 9.0  Hemoglobin 11.1 - 15.9 g/dL 12.0 12.6 12.1  Hematocrit 34.0 - 46.6 % 39.9 39.6 37.6   Platelets 150 - 450 x10E3/uL 265 287 236.0    Lab Results  Component Value Date/Time   VD25OH 26 (L) 09/16/2011 10:58 AM    Clinical ASCVD: Yes  The 10-year ASCVD risk score (Arnett DK, et al., 2019) is: 34.3%   Values used to calculate the score:     Age: 71 years     Sex: Female     Is Non-Hispanic African American: No     Diabetic: Yes     Tobacco smoker: Yes     Systolic Blood Pressure: 053 mmHg     Is BP treated: Yes     HDL Cholesterol: 44 mg/dL     Total Cholesterol: 130 mg/dL    Social History   Tobacco Use  Smoking Status Never   Passive exposure: Yes  Smokeless Tobacco Never   BP Readings from Last 3 Encounters:  08/22/21 120/60  06/15/21 138/68  03/12/21 (!) 142/82   Pulse Readings from Last 3 Encounters:  08/22/21 88  06/15/21 90  03/12/21 93   Wt Readings from Last 3 Encounters:  08/22/21 167 lb 9.6 oz (76 kg)  06/15/21 169 lb (76.7 kg)  06/14/21 178 lb (80.7 kg)    Assessment: Review of patient past medical history, allergies, medications, health status, including review of consultants reports, laboratory and other test data, was performed as part of comprehensive evaluation and provision of chronic care management services.   SDOH:  (Social Determinants of Health) assessments and interventions performed:     CCM Care Plan  Allergies  Allergen Reactions   Prednisone Other (See Comments)    REACTION: mood swings, nightmares. "Shot doesn't bother me, reaction is just with the pill" she states she has had the steroid injections before. From our records methylprednisone was given to her in 2013 without any complications.    Medications Reviewed Today     Reviewed by [name], RPH-CPP (Pharmacist) on 08/30/21 at 1004  Med List Status: <None>   Medication Order Taking? Sig Documenting Provider Last Dose Status Informant  albuterol (VENTOLIN HFA) 108 (90 Base) MCG/ACT inhaler [id number] Yes INHALE 2 PUFFS INTO THE LUNGS EVERY EIGHT HOURS AS  NEEDED FOR WHEEZING OR SHORTNESS OF BREATH. Jonetta Osgood, MD Taking Active   aspirin EC 81 MG tablet [id number] Yes Take 81 mg by mouth daily. [provider] Taking Active Self  calcium carbonate (TUMS EX) 750 MG chewable tablet [id number] Yes Chew 1 tablet by mouth daily as needed for heartburn. [provider] Taking Active   Cholecalciferol (VITAMIN D3) 1000 UNITS CAPS [id number] Yes Take 1,000 Units by mouth daily. [provider] Taking Active Self  dapagliflozin propanediol (FARXIGA) 10 MG TABS tablet [id number] Yes Take 1 tablet (10 mg total) by mouth daily. Shamleffer, Melanie Crazier, MD Taking Active   Dulaglutide (TRULICITY) 3 HE/1.7EY SOPN [id number] Yes Inject 3 mg into the skin once a  week. [provider] Taking Active   fexofenadine (ALLEGRA) 180 MG tablet [id number] Yes Take 1 tablet by mouth daily. [provider] Taking Active Self  FLUoxetine (PROZAC) 40 MG capsule [id number] Yes TAKE 1 CAPSULE BY MOUTH EVERY DAY Copland, Gay Filler, MD Taking Active   fluticasone (FLONASE) 50 MCG/ACT nasal spray [id number] Yes Place 2 sprays into both nostrils as needed for allergies or rhinitis. [provider] Taking Active   furosemide (LASIX) 20 MG tablet [id number] Yes TAKE 1 TABLET BY MOUTH EVERY DAY Burtis Junes, NP Taking Active   gabapentin (NEURONTIN) 300 MG capsule [id number] Yes TAKE 2 CAPSULES BY MOUTH 2 TIMES DAILY. Copland, Gay Filler, MD Taking Active   hydrALAZINE (APRESOLINE) 25 MG tablet [id number] Yes Take 50 mg by mouth 2 (two) times daily. [provider] Taking Active   hydrOXYzine (ATARAX/VISTARIL) 25 MG tablet [id number] Yes TAKE 1/2 TO 1 TABLET BY MOUTH EVERY 8 HOURS AS NEEDED FOR ITCHING Copland, Gay Filler, MD Taking Active            Med Note Alric Ran Jan 01, 2021 10:53 AM)    Insulin Glargine (BASAGLAR KWIKPEN) 100 UNIT/ML [id number] Yes Inject 30 Units into the skin daily. Shamleffer, Melanie Crazier, MD Taking Active   Insulin Pen Needle 32G X 4 MM MISC [id number] Yes 1 Device by Does not apply route daily. Shamleffer, Melanie Crazier, MD Taking Active   LORazepam (ATIVAN) 0.5 MG tablet [id number] Yes Take 1 tablet (0.5 mg total) by mouth 2 (two) times daily as needed for anxiety. Roma Schanz R, DO Taking Active   magnesium oxide (MAG-OX) 400 MG tablet [id number] Yes Take 400 mg by mouth daily. [provider] Taking Active Self  metFORMIN (GLUCOPHAGE-XR) 500 MG 24 hr tablet [id number] Yes TAKE 2 TABLETS BY MOUTH TWICE A DAY Copland, Gay Filler, MD Taking Active   metoprolol succinate (TOPROL-XL) 100 MG 24 hr tablet [id number] Yes TAKE 1 TABLET BY MOUTH EVERY DAY Copland, Gay Filler, MD Taking Active   montelukast (SINGULAIR) 10 MG tablet [id number] Yes Take 1 tablet (10 mg total) by mouth at bedtime. Copland, Gay Filler, MD Taking Active   nitroGLYCERIN (NITROSTAT) 0.4 MG SL tablet [id number] Yes Place 1 tablet (0.4 mg total) under the tongue every 5 (five) minutes as needed for chest pain. Copland, Gay Filler, MD Taking Active   Potassium 99 MG TABS [id number] Yes Take 1 tablet by mouth daily. [provider] Taking Active Self  rosuvastatin (CRESTOR) 20 MG tablet [id number] Yes TAKE 1 TABLET BY MOUTH EVERY DAY Copland, Gay Filler, MD Taking Active   Tiotropium Bromide Monohydrate (SPIRIVA RESPIMAT) 2.5 MCG/ACT AERS [id number] Yes Inhale 2 puffs into the lungs daily. Copland, Gay Filler, MD Taking Active   traZODone (DESYREL) 50 MG tablet [id number] Yes TAKE 1/2 TO 1 TABLET BY MOUTH AT BEDTIME AS NEEDED FOR SLEEP Copland, Gay Filler, MD Taking Active Self  valsartan (DIOVAN) 160 MG tablet [id number] Yes Take 1 tablet (160 mg total) by mouth daily. Copland, Gay Filler, MD Taking Active   vitamin E 180 MG (400 UNITS) capsule [id number] Yes Take 400 Units by mouth daily. [provider] Taking Active   zinc gluconate 50 MG tablet [id number] Yes Take 50 mg by mouth daily.  [provider] Taking Active Self  Med List Note (Copland, Gay Filler, MD 03/12/21 1526): Lorazepam rarely.            Patient Active Problem List  Diagnosis Date Noted   Osteopenia 06/27/2021   Melena 12/18/2020   History of colonic polyps 12/18/2020   Constipation 12/18/2020   Obesity    Iron deficiency anemia    Insulin resistance    Hyperlipidemia    Cervical spondylosis    Anxiety    Pneumonia due to COVID-19 virus 08/21/2020   Hyperlipidemia associated with type 2 diabetes mellitus ([place]) 08/21/2020   Type 2 diabetes mellitus with diabetic polyneuropathy, with long-term current use of insulin ([place]) 12/10/2019   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   Tinnitus of both ears 11/18/2019   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   Eustachian tube dysfunction, bilateral 11/18/2019   Chronic eczematous otitis externa of both ears 11/18/2019   Obesity (BMI 30-39.9) 08/24/2018   Depression with anxiety 08/24/2018   CAD (coronary artery disease) 08/24/2018   Abnormal cardiovascular stress test 08/24/2018   Precordial chest pain    Sepsis ([place]) 09/18/2017   Chronic diastolic CHF (congestive heart failure) ([place]) 09/18/2017   Chronic rhinitis 08/07/2016   COPD with acute exacerbation ([place]) 04/25/2016   Hypertension associated with diabetes ([place]) 01/30/2016   OSA (obstructive sleep apnea) 05/06/2014   Dyspnea on exertion 09/02/2012   GERD 06/28/2008    Immunization History  Administered Date(s) Administered   Fluad Quad(high Dose 65+) 09/04/2020   Influenza Whole 04/14/2010   Influenza, High Dose Seasonal PF 04/03/2017, 05/11/2018, 03/05/2019, 03/12/2021   Influenza,inj,Quad PF,6+ Mos 05/10/2013, 04/05/2014, 05/22/2015, 04/25/2016   PFIZER Comirnaty(Gray Top)Covid-19 Tri-Sucrose Vaccine 01/11/2021, 02/01/2021   Pneumococcal Conjugate-13 07/24/2016   Pneumococcal Polysaccharide-23 04/05/2014, 05/22/2015   Tdap 07/11/2014,  03/05/2019   Zoster Recombinat (Shingrix) 03/05/2019, 06/05/2019    Conditions to be addressed/monitored: CAD, HTN, HLD, COPD, DMII, and Depression  Care Plan : General Pharmacy (Adult)  Updates made by [name], RPH-CPP since 08/30/2021 12:00 AM     Problem: Medication Adherence (Wellness)      Long-Range Goal: Medication Adherence Maintained   Recent Progress: On track  Note:   Current Barriers:  Unable to independently afford treatment regimen Unable to achieve control of type 2 DM, HTN   Pharmacist Clinical Goal(s):  Over the next 90 days, patient will achieve adherence to monitoring guidelines and medication adherence to achieve therapeutic efficacy achieve control of diabetes and HTN as evidenced by meeting goal as described in patient's care plan,  through collaboration with PharmD and provider.   Interventions: 1:1 collaboration with Copland, Gay Filler, MD regarding development and update of comprehensive plan of care as evidenced by provider attestation and co-signature Inter-disciplinary care team collaboration (see longitudinal plan of care) Comprehensive medication review performed; medication list updated in electronic medical record  Diabetes: Uncontrolled but improving; A1c decreased from 9.3% to 8.3% and most recent A1c was 7.3%; Goal A1c is < 7.0% Weight was 178 lbs in February 2022 and today patient reports weight is 168lb (has lost 10 lbs in the last year) Current treatment: Farxiga 10mg  daily Basaglar 30 units daily Trulicity 3mg  weekly (increased 02/2021) metformin ER 500mg  - take 2 tablets twice a day Patient reports she is skipping Basaglar rarely now. Usually if blood pressure is <90.  Current glucose readings: 85 to 150 Denies hypoglycemic/hyperglycemic symptoms Interventions:  Educated on importance of taking all medication for diabetes as prescribed (A1c is improved - could be at goal with improved adhernece) .  Reviewed signs and symptoms of  hypoglycemia and how to treat.  Reviewed 2023 formulary for Mail Carriers policy. [facility]40 per 90 days.   Hypertension / CHF: Controlled; BP goal 140/90 Current treatment:  hydralazine 41m  - take 2 tablets = 52mtwice a day furosemide 2027maily (only taking as needed) Valsartan 160m25mily  metoprolol succinate 100mg40mly. Farxiga 10mg 48my  Current home readings: patient did not have numbers to report Low adherence to hydralazine noted per refill history. Reports trace edema - will elevated legs or take furosemide if needed. Denies SOB.  Weight has been decreasing. Interventions:  Reviewed BP goals and importance of obtaining blood pressure goals to decrease risk of stroke and renal disease.  Discussed signs and symptoms of CHF exacerbation - weight gain, SOB, abdominal fullness, swelling in legs or abdomen, Fatigue and weakness, changes in ability to perform usual activities, persistent cough or wheezing with white or pink blood-tinged mucus, nausea and lack of appetite Continue to weigh daily - report weight gain of more than 3 lbs in 24 hours or 5 lbs in 1 week.  COPD: Controlled; Goal: prevent exacerbations and hospitalizations     Current treatment:  Spiriva 2.5mg - 30mnhalation once a day  Albuterol inhaler as needed for shortness of breath and wheezing;  montelukast 10mg da59mat bedtime;  one exacerbation requiring treatment in the last 12 months (related to COVID) Assisted with cost of Spiriva last week. Patient reports today she was able to get 3 inhalers for $40! Uses rescue inhaler - once per month Maintenance inhaler - restarted daily use Intervention:  Continue to use Spiriva daily Continue to use albuterol as needed [facility] [place] with Anxiety:  Controlled Current therapy Trazodone 50mg - t79m0.5 tablet = 25mg at b88mme as needed Fluoxetine 40mg  dail51mcommend continue current regimen for depression and anxiety.  Hyperlipidemia / CAD:  Controlled; LDL goal <70 current therapy: rosuvastatin 20mg daily 46m81mg daily H42mot filled rosuvastatin per EMR records since 09/2020 - patient thinks she has been taking but is not sure.  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 Reviewed current branded medications. Identified discount cards and provided to patient.   [facility] [place] Interventions:  Provided  discount cards for Spiriva, TrulFulton Mole Basaglar  Patient self care activities - Over the next 90 days patient will: Take discount cards to your pharmacy to use to lower cost of above medications Contact provider's office or clinical pharmacist if you have an problems of questions about medications or medication costs.  Coordinated with pharmacy to reviewed refill records and to [facility] all [facility] [employer] coverage of current medications using [employer] site. [employer] branded meds are $40 per 90 days and generics are $10 per 90 days.     Medication Assistance: Provided coupons to lower copay for Spiriva, FarxVickey Hugernd Basaglar   [facility]  [id number] [id number] [id number] [phone]  [phone]  Patient agrees to Care Plan and Follow-up.  Plan: Telephone follow up appointment with care management team member scheduled for:  3 months  Mckay Tegtmeyer, Cherre Robinscal Pharmacist [facility]

## 2021-08-30 NOTE — Patient Instructions (Signed)
Ms. Eichhorn,  It was a pleasure speaking with you today.  I have attached a summary of our visit today and information about your health goals.   If you have any questions or concerns, please feel free to contact me either at the phone number below or with a MyChart message.   Keep up the good work!  [name], PharmD Clinical Pharmacist [facility] [phone] (direct line)  [phone] (main office number)   Chronic Care Management Care Plan (updated 08/30/2021)  Chronic Obstructive Pulmonary Disease: Pharmacist Clinical Goal(s):  Over the next 90 days, patient will continue to adhere to current inhaler regimen and identify potential cost savings through collaboration with PharmD and provider.  Current treatment:  Spiriva 2.5mg  - 1 inhalation once a day;  Albuterol inhaler as needed for shortness of breath and wheezing;  montelukast 10mg  daily at bedtime;  benzonatate 200mg  up to 3 times a day as needed for cough.  Interventions:  Recommended continue current regimen.  Provided discount coupon for Spiriva and updated prescription [facility] Sp[facility] Updated montelukast prescription sent to [facility] Patient self care activities - Over the next 90 days patient will: Take medications as prescribed Collaborate with provider on medication access solutions  Hypertension / High Blood Pressure: Pharmacist Clinical Goal(s):  Over the next 90 days, patient will achieve adherence to monitoring guidelines and medication adherence to achieve therapeutic efficacy. Also will achieve control of blood pressure as evidenced by home BP readings < 140/90 through collaboration with PharmD and provider.  BP Readings from Last 3 Encounters:  08/22/21 120/60  06/15/21 138/68  03/12/21 (!) 142/82   Current treatment:  Valsartan 160mg  daily metoprolol succinate 100mg  daily hydralazine 25mg  -Take 2 tablets = 50mg  twice a day  furosemide 20mg   daily Interventions: Educated on BP goals and improtance of obtaining BP goals to decrease risk of stroke and renal disease Reviewed refill history with pharmacy. Updated prescription for hydralazine sent [facility] Patient Self-Care Activities - Over the next 180 days patient will:  Focus on medication adherence by taking all medications as prescribed Check blood pressure 2 to 3 times per week, document, and provide at future appointments  Diabetes: Pharmacist Clinical Goal(s):  Over the next 90 days, patient will achieve adherence to monitoring guidelines and medication adherence to achieve therapeutic efficacy Achieve control of diabetes as evidenced by meeting goal of A1c <7.0% through collaboration with PharmD and provider.  Lab Results  Component Value Date   HGBA1C 7.3 (H) 08/22/2021   Current treatment: Farxiga 10mg  daily Basaglar 30 units daily Trulicity 3mg  weekly metformin ER 500mg  - take 2 tablets twice a day Interventions: Educated on importance of taking all medication for diabetes as prescribed.  Provided discount cards for Cruzita Lederer and Trulicity  Discussed taking above medication regimen as prescribed to reach A1c goal Reviewed signs and symptoms of hypoglycemia and how to treat.  Updated prescription for Basaglar send to [facility] Patient Self-Care Activities - Over the next 180 days patient will:  Focus on medication adherence by taking all medications as prescribed Check blood glucose 2 times per day; document, and provide at future appointments Collaborate with clinical pharmacist and provider on medication access solutions. Provided discount coupons for Farxiga, Trulicity and Engineer, agricultural (max copay $25)   Depression with Anxiety:  Pharmacist Clinical Goal(s):  Over the next 90 days, patient will continue to adhere to medication regimen for depression and anxiety while minimizing potential side effects Current  treatment:  trazodone 50mg  - take  0.5 tablet = 25mg  at bedtime as needed fluoxetine 40mg  daily Interventions:  Recommend continue current regimen for depression and anxiety. Patient self care activities - Over the next 90 days patient will: Take medications as prescribed  Hyperlipidemia / CAD:  Pharmacist Clinical Goal(s):  Over the next 90 days, patient will maintain LDL of <70  Lab Results  Component Value Date   LDLCALC 45 01/01/2021   Current treatment:  Rosuvastatin 20mg  daily  Aspirin 81mg  daily Interventions:  Discussed following diet low in sugar and saturated fat.  Discussed improtance of taking statin in prevention of cardiovascular disease Updated prescription for rosuvastatin sent to [place] Patient self care activities - Over the next 90 days patient will: Continue current medications as prescribed for heart / cholesterol   Medication Management: Pharmacist Clinical Goal(s): Over the next 90 days, patient will take medications as prescribed [facility] Interventions:  Provided  discount cards for Spiriva, Trulicity, Farxiga and [place]  Patient self care activities - Over the next 90 days patient will: Take discount cards to your pharmacy to use to lower cost of above medications Contact provider's office or clinical pharmacist if you have an problems of questions about medications or medication costs.   Patient verbalizes understanding of instructions and care plan provided today and agrees to view in [place]. Active MyChart status confirmed with patient.

## 2021-09-11 DIAGNOSIS — I2510 Atherosclerotic heart disease of native coronary artery without angina pectoris: Secondary | ICD-10-CM | POA: Diagnosis not present

## 2021-09-11 DIAGNOSIS — E785 Hyperlipidemia, unspecified: Secondary | ICD-10-CM | POA: Diagnosis not present

## 2021-09-11 DIAGNOSIS — Z7984 Long term (current) use of oral hypoglycemic drugs: Secondary | ICD-10-CM | POA: Diagnosis not present

## 2021-09-11 DIAGNOSIS — J449 Chronic obstructive pulmonary disease, unspecified: Secondary | ICD-10-CM | POA: Diagnosis not present

## 2021-09-11 DIAGNOSIS — E1159 Type 2 diabetes mellitus with other circulatory complications: Secondary | ICD-10-CM | POA: Diagnosis not present

## 2021-09-11 DIAGNOSIS — F32A Depression, unspecified: Secondary | ICD-10-CM

## 2021-09-11 DIAGNOSIS — I10 Essential (primary) hypertension: Secondary | ICD-10-CM | POA: Diagnosis not present

## 2021-09-13 DIAGNOSIS — Z20822 Contact with and (suspected) exposure to covid-19: Secondary | ICD-10-CM | POA: Diagnosis not present

## 2021-09-13 DIAGNOSIS — Z20828 Contact with and (suspected) exposure to other viral communicable diseases: Secondary | ICD-10-CM | POA: Diagnosis not present

## 2021-09-26 ENCOUNTER — Encounter: Payer: Self-pay | Admitting: Gastroenterology

## 2021-09-26 ENCOUNTER — Ambulatory Visit (INDEPENDENT_AMBULATORY_CARE_PROVIDER_SITE_OTHER): Payer: Medicare Other | Admitting: Gastroenterology

## 2021-09-26 VITALS — BP 152/66 | HR 92 | Ht 59.0 in | Wt 166.6 lb

## 2021-09-26 DIAGNOSIS — R152 Fecal urgency: Secondary | ICD-10-CM

## 2021-09-26 DIAGNOSIS — Z8371 Family history of colonic polyps: Secondary | ICD-10-CM | POA: Diagnosis not present

## 2021-09-26 DIAGNOSIS — Z800 Family history of malignant neoplasm of digestive organs: Secondary | ICD-10-CM | POA: Diagnosis not present

## 2021-09-26 DIAGNOSIS — I25118 Atherosclerotic heart disease of native coronary artery with other forms of angina pectoris: Secondary | ICD-10-CM | POA: Diagnosis not present

## 2021-09-26 DIAGNOSIS — R197 Diarrhea, unspecified: Secondary | ICD-10-CM | POA: Diagnosis not present

## 2021-09-26 MED ORDER — NA SULFATE-K SULFATE-MG SULF 17.5-3.13-1.6 GM/177ML PO SOLN: 1.0000 | Freq: Once | ORAL | 0 refills | Status: AC

## 2021-09-26 MED ORDER — DICYCLOMINE HCL 10 MG PO CAPS: 10.0000 mg | ORAL_CAPSULE | Freq: Three times a day (TID) | ORAL | 11 refills | Status: DC

## 2021-09-26 NOTE — Progress Notes (Signed)
? ? ?History of Present Illness: This is a 71 year old female referred by Copland, Gay Filler, MD for the evaluation of diarrhea, FHCC, FH colon polyps.  She relates a history of diarrhea for several years that has gradually worsened.  She relates urgent diarrhea with occasional fecal incontinence that generally follows meals for at least several months.  She notes lower abdominal cramping associated with diarrhea.  She is concerned that metformin is causing her symptoms.  She was previously followed by Dr. Earlean Shawl then changed to me, then changed to Dr. Rolm Bookbinder in 2016 and now is here to see me.  Dr. Bennye Alm evaluation was for chronic diarrhea included an EGD and colonoscopy as below. The evaluation was unremarkable.  Her daughter had a precancerous colon polyp in her late 76s.  Her sister was diagnosed with colon cancer at age 13.  Denies weight loss, constipation, change in stool caliber, melena, hematochezia, nausea, vomiting, dysphagia, reflux symptoms, chest pain. ? ? ?Colonoscopy to TI in 05/2015 normal, random biopsies negative (Dr. Rolm Bookbinder) ?EGD in 05/2015 normal, duodenal biopsies negative (Dr. Rolm Bookbinder) ?Colonoscopy in Jan. 2010 was normal (me) ? ? ? ?Allergies  ?Allergen Reactions  ? Prednisone Other (See Comments)  ?  REACTION: mood swings, nightmares. "Shot doesn't bother me, reaction is just with the pill" she states she has had the steroid injections before. From our records methylprednisone was given to her in 2013 without any complications.  ? ?Outpatient Medications Prior to Visit  ?Medication Sig Dispense Refill  ? albuterol (VENTOLIN HFA) 108 (90 Base) MCG/ACT inhaler INHALE 2 PUFFS INTO THE LUNGS EVERY EIGHT HOURS AS NEEDED FOR WHEEZING OR SHORTNESS OF BREATH. 18 g 0  ? aspirin EC 81 MG tablet Take 81 mg by mouth daily.    ? calcium carbonate (TUMS EX) 750 MG chewable tablet Chew 1 tablet by mouth daily as needed for heartburn.    ? Cholecalciferol (VITAMIN D3) 1000 UNITS CAPS Take  1,000 Units by mouth daily.    ? dapagliflozin propanediol (FARXIGA) 10 MG TABS tablet Take 1 tablet (10 mg total) by mouth daily. 90 tablet 3  ? Dulaglutide (TRULICITY) 3 PY/0.9XI SOPN Inject 3 mg into the skin once a week.    ? fexofenadine (ALLEGRA) 180 MG tablet Take 1 tablet by mouth daily.    ? FLUoxetine (PROZAC) 40 MG capsule TAKE 1 CAPSULE BY MOUTH EVERY DAY 90 capsule 1  ? fluticasone (FLONASE) 50 MCG/ACT nasal spray Place 2 sprays into both nostrils as needed for allergies or rhinitis.    ? furosemide (LASIX) 20 MG tablet TAKE 1 TABLET BY MOUTH EVERY DAY 90 tablet 2  ? gabapentin (NEURONTIN) 300 MG capsule TAKE 2 CAPSULES BY MOUTH 2 TIMES DAILY. 360 capsule 1  ? hydrALAZINE (APRESOLINE) 25 MG tablet Take 2 tablets (50 mg total) by mouth 2 (two) times daily. For blood pressure 360 tablet 1  ? Insulin Glargine (BASAGLAR KWIKPEN) 100 UNIT/ML Inject 30 Units into the skin daily. Profile until patient requests (Patient taking differently: Inject 30 Units into the skin daily as needed. Profile until patient requests) 30 mL 1  ? Insulin Pen Needle 32G X 4 MM MISC 1 Device by Does not apply route daily. 100 each 3  ? LORazepam (ATIVAN) 0.5 MG tablet Take 1 tablet (0.5 mg total) by mouth 2 (two) times daily as needed for anxiety. 30 tablet 1  ? magnesium oxide (MAG-OX) 400 MG tablet Take 400 mg by mouth daily.    ? metFORMIN (GLUCOPHAGE-XR)  500 MG 24 hr tablet TAKE 2 TABLETS BY MOUTH TWICE A DAY 360 tablet 3  ? metoprolol succinate (TOPROL-XL) 100 MG 24 hr tablet TAKE 1 TABLET BY MOUTH EVERY DAY 90 tablet 1  ? montelukast (SINGULAIR) 10 MG tablet Take 1 tablet (10 mg total) by mouth at bedtime. For allergies / asthma 90 tablet 1  ? nitroGLYCERIN (NITROSTAT) 0.4 MG SL tablet Place 1 tablet (0.4 mg total) under the tongue every 5 (five) minutes as needed for chest pain. 20 tablet 3  ? Potassium 99 MG TABS Take 1 tablet by mouth daily.    ? rosuvastatin (CRESTOR) 20 MG tablet Take 1 tablet (20 mg total) by mouth  daily. For cholesterol / heart 90 tablet 3  ? Tiotropium Bromide Monohydrate (SPIRIVA RESPIMAT) 2.5 MCG/ACT AERS Inhale 2 puffs into the lungs daily. 4 g 5  ? traZODone (DESYREL) 50 MG tablet TAKE 1/2 TO 1 TABLET BY MOUTH AT BEDTIME AS NEEDED FOR SLEEP 90 tablet 1  ? valsartan (DIOVAN) 160 MG tablet Take 1 tablet (160 mg total) by mouth daily. 90 tablet 3  ? vitamin E 180 MG (400 UNITS) capsule Take 400 Units by mouth daily.    ? zinc gluconate 50 MG tablet Take 50 mg by mouth daily.    ? hydrOXYzine (ATARAX/VISTARIL) 25 MG tablet TAKE 1/2 TO 1 TABLET BY MOUTH EVERY 8 HOURS AS NEEDED FOR ITCHING 270 tablet 3  ? ?No facility-administered medications prior to visit.  ? ?Past Medical History:  ?Diagnosis Date  ? Anxiety   ? Prior suicide attempt  ? CAD (coronary artery disease)   ? a) s/p DES to LAD 07/2005 b) Last Myoview low risk 11/2011 showing small fixed apical perfusion defect (prior MI vs attenuation) but no ischemia - normal EF.  ? Cervical spondylosis   ? Chest pain 12/10/2011  ? Chronic diastolic CHF (congestive heart failure) ([place]) 09/18/2017  ? Chronic eczematous otitis externa of both ears 11/18/2019  ? Chronic rhinitis 08/07/2016  ? Constipation 12/18/2020  ? COPD with acute exacerbation ([place]) 04/25/2016  ? Coronary atherosclerosis 06/28/2008  ? Depression   ? Depression with anxiety   ? Diabetes mellitus ([place]) 09/08/2017  ? Diabetes mellitus without complication ([place])   ? Dyspnea on exertion 09/02/2012  ? CXR 07/2012:  No acute process.    ? Eustachian tube dysfunction, bilateral 11/18/2019  ? GERD 06/28/2008  ? GERD (gastroesophageal reflux disease)   ? History of colonic polyps 12/18/2020  ? HLD (hyperlipidemia)   ? Hyperlipidemia   ? Hyperlipidemia associated with type 2 diabetes mellitus ([place]) 08/21/2020  ? Hypertension   ? Hypertension associated with diabetes ([place]) 01/30/2016  ? Hypokalemia 01/27/2016  ? Insulin resistance   ? Iron deficiency anemia   ? Lobar pneumonia, unspecified organism ([place]) 11/13/2017  ? Melena  12/18/2020  ? 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  ? Obesity   ? Obesity (BMI 30-39.9) 08/24/2018  ? Last Assessment & Plan:  Exercise and weight reduction is encouraged.  ? OSA (obstructive sleep apnea) 05/06/2014  ? Pneumonia due to COVID-19 virus 08/21/2020  ? Precordial chest pain   ? Sepsis ([place]) 09/18/2017  ? 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 Tinnitus of both ears 11/18/2019  ? Type 2 diabetes mellitus with diabetic polyneuropathy, with long-term current use of insulin ([place]) 12/10/2019  ? Type 2 diabetes mellitus with hyperglycemia, with long-term current use of insulin ([place]) 12/10/2019  ? Uncontrolled type 2 diabetes mellitus with complication,  with long-term current use of insulin 03/14/2020  ? ?Past Surgical History:  ?Procedure Laterality Date  ? BREAST ENHANCEMENT SURGERY    ? CARDIAC CATHETERIZATION  06/17/2007  ? NORMAL. EF 60%  ? CARDIAC CATHETERIZATION N/A 01/29/2016  ? Procedure: Left Heart Cath and Coronary Angiography;  Surgeon: Sherren Mocha, MD;  Location: [facility];  Service: Cardiovascular;  Laterality: N/A;  ? CERVICAL SPONDYLOSIS    ? SINGLE LEVEL FUSION  ? CHILDBIRTH    ? X3  ? CORONARY STENT PLACEMENT  07/2005  ? LEFT ANTERIOR DESCENDING  ? FOREARM FRACTURE SURGERY  2010  ? hand and shoulder   ? INCISION AND DRAINAGE BREAST ABSCESS  01/05/2012  ?    ? INCISION AND DRAINAGE PERIRECTAL ABSCESS N/A 02/18/2014  ? Procedure: IRRIGATION AND DEBRIDEMENT PERIRECTAL ABSCESS;  Surgeon: Pedro Earls, MD;  Location: [facility];  Service: General;  Laterality: N/A;  ? LEFT HEART CATH AND CORONARY ANGIOGRAPHY N/A 10/10/2017  ? Procedure: LEFT HEART CATH AND CORONARY ANGIOGRAPHY;  Surgeon: Burnell Blanks, MD;  Location: [facility];  Service: Cardiovascular;  Laterality: N/A;  ? LUMBAR LAMINECTOMY    ? ROTATOR CUFF REPAIR    ? bilaterla  ? TONSILLECTOMY AND ADENOIDECTOMY    ? TUBAL LIGATION    ? VIDEO BRONCHOSCOPY Bilateral 08/13/2016  ? Procedure:  VIDEO BRONCHOSCOPY WITHOUT FLUORO;  Surgeon: Collene Gobble, MD;  Location: [facility];  Service: Cardiopulmonary;  Laterality: Bilateral;  ? ?Social History  ? ?Socioeconomic History  ? Marital status: [place]

## 2021-09-26 NOTE — Patient Instructions (Signed)
We have sent the following medications to your pharmacy for you to pick up at your convenience: dicyclomine. ? ?Please talk to your primary care physician about holding your metformin temporarily to see if it helps your diarrhea symptoms.  ? ?You have been scheduled for a colonoscopy. Please follow written instructions given to you at your visit today.  ?Please pick up your prep supplies at the pharmacy within the next 1-3 days. ?If you use inhalers (even only as needed), please bring them with you on the day of your procedure. ? ?The [facility] providers would like to encourage you to use [facility] to communicate with providers for non-urgent requests or questions.  Due to long hold times on the telephone, sending your provider a message by [facility] may be a faster and more efficient way to get a response.  Please allow 48 business hours for a response.  Please remember that this is for non-urgent requests.  ? ?Due to recent changes in healthcare laws, you may see the results of your imaging and laboratory studies on MyChart before your provider has had a chance to review them.  We understand that in some cases there may be results that are confusing or concerning to you. Not all laboratory results come back in the same time frame and the provider may be waiting for multiple results in order to interpret others.  Please give [place] 48 hours in order for your provider to thoroughly review all the results before contacting the office for clarification of your results.  ? ?Thank you for choosing me and [facility]. ? ?Malcolm T. Dagoberto Ligas., MD., Marval Regal ? ? ?

## 2021-10-01 ENCOUNTER — Other Ambulatory Visit: Payer: Self-pay | Admitting: Gastroenterology

## 2021-10-01 ENCOUNTER — Other Ambulatory Visit: Payer: Self-pay | Admitting: Family Medicine

## 2021-10-01 ENCOUNTER — Ambulatory Visit (AMBULATORY_SURGERY_CENTER): Payer: Medicare Other | Admitting: Gastroenterology

## 2021-10-01 ENCOUNTER — Encounter: Payer: Self-pay | Admitting: Gastroenterology

## 2021-10-01 VITALS — BP 135/67 | HR 80 | Temp 98.9°F | Resp 13 | Ht 59.0 in | Wt 166.0 lb

## 2021-10-01 DIAGNOSIS — D120 Benign neoplasm of cecum: Secondary | ICD-10-CM

## 2021-10-01 DIAGNOSIS — Z800 Family history of malignant neoplasm of digestive organs: Secondary | ICD-10-CM | POA: Diagnosis not present

## 2021-10-01 DIAGNOSIS — R197 Diarrhea, unspecified: Secondary | ICD-10-CM

## 2021-10-01 DIAGNOSIS — Z8371 Family history of colonic polyps: Secondary | ICD-10-CM

## 2021-10-01 DIAGNOSIS — I509 Heart failure, unspecified: Secondary | ICD-10-CM | POA: Diagnosis not present

## 2021-10-01 DIAGNOSIS — K640 First degree hemorrhoids: Secondary | ICD-10-CM

## 2021-10-01 DIAGNOSIS — J449 Chronic obstructive pulmonary disease, unspecified: Secondary | ICD-10-CM | POA: Diagnosis not present

## 2021-10-01 DIAGNOSIS — K529 Noninfective gastroenteritis and colitis, unspecified: Secondary | ICD-10-CM | POA: Diagnosis not present

## 2021-10-01 DIAGNOSIS — D124 Benign neoplasm of descending colon: Secondary | ICD-10-CM | POA: Diagnosis not present

## 2021-10-01 DIAGNOSIS — D122 Benign neoplasm of ascending colon: Secondary | ICD-10-CM

## 2021-10-01 DIAGNOSIS — E1142 Type 2 diabetes mellitus with diabetic polyneuropathy: Secondary | ICD-10-CM

## 2021-10-01 DIAGNOSIS — E119 Type 2 diabetes mellitus without complications: Secondary | ICD-10-CM | POA: Diagnosis not present

## 2021-10-01 DIAGNOSIS — I2510 Atherosclerotic heart disease of native coronary artery without angina pectoris: Secondary | ICD-10-CM | POA: Diagnosis not present

## 2021-10-01 HISTORY — PX: COLONOSCOPY WITH PROPOFOL: SHX5780

## 2021-10-01 MED ORDER — SODIUM CHLORIDE 0.9 % IV SOLN: 500.0000 mL | Freq: Once | INTRAVENOUS | Status: DC

## 2021-10-01 MED ORDER — LOPERAMIDE HCL 2 MG PO TABS: 2.0000 mg | ORAL_TABLET | Freq: Two times a day (BID) | ORAL | 0 refills | Status: DC | PRN

## 2021-10-01 NOTE — Progress Notes (Signed)
Pt's states no medical or surgical changes since previsit or office visit.  VS CW  

## 2021-10-01 NOTE — Progress Notes (Signed)
See 09/26/2021 H&P, no changes.  ?

## 2021-10-01 NOTE — Patient Instructions (Addendum)
Handouts on hemorrhoids and polyps given to patient. ?Await pathology results. ?Resume previous diet and continue present medications. ?Pick up prescription for Imodium - 1 pill twice a day as needed for diarrhea  ?Repeat colonoscopy vs. Not depending on symptoms for surveillance purposes ? ?YOU HAD AN ENDOSCOPIC PROCEDURE TODAY AT [facility]:   Refer to the procedure report that was given to you for any specific questions about what was found during the examination.  If the procedure report does not answer your questions, please call your gastroenterologist to clarify.  If you requested that your care partner not be given the details of your procedure findings, then the procedure report has been included in a sealed envelope for you to review at your convenience later. ? ?YOU SHOULD EXPECT: Some feelings of bloating in the abdomen. Passage of more gas than usual.  Walking can help get rid of the air that was put into your GI tract during the procedure and reduce the bloating. If you had a lower endoscopy (such as a colonoscopy or flexible sigmoidoscopy) you may notice spotting of blood in your stool or on the toilet paper. If you underwent a bowel prep for your procedure, you may not have a normal bowel movement for a few days. ? ?Please Note:  You might notice some irritation and congestion in your nose or some drainage.  This is from the oxygen used during your procedure.  There is no need for concern and it should clear up in a day or so. ? ?SYMPTOMS TO REPORT IMMEDIATELY: ? ?Following lower endoscopy (colonoscopy or flexible sigmoidoscopy): ? Excessive amounts of blood in the stool ? Significant tenderness or worsening of abdominal pains ? Swelling of the abdomen that is new, acute ? Fever of 100?F or higher ? ?For urgent or emergent issues, a gastroenterologist can be reached at any hour by calling [phone]. ?Do not use MyChart messaging for urgent concerns.  ? ? ?DIET:  We do recommend  a small meal at first, but then you may proceed to your regular diet.  Drink plenty of fluids but you should avoid alcoholic beverages for 24 hours. ? ?ACTIVITY:  You should plan to take it easy for the rest of today and you should NOT DRIVE or use heavy machinery until tomorrow (because of the sedation medicines used during the test).   ? ?FOLLOW UP: ?Our staff will call the number listed on your records 48-72 hours following your procedure to check on you and address any questions or concerns that you may have regarding the information given to you following your procedure. If we do not reach you, we will leave a message.  We will attempt to reach you two times.  During this call, we will ask if you have developed any symptoms of COVID 19. If you develop any symptoms (ie: fever, flu-like symptoms, shortness of breath, cough etc.) before then, please call [phone].  If you test positive for Covid 19 in the 2 weeks post procedure, please call and report this information to [place].   ? ?If any biopsies were taken you will be contacted by phone or by letter within the next 1-3 weeks.  Please call us at [phone] if you have not heard about the biopsies in 3 weeks.  ? ? ?SIGNATURES/CONFIDENTIALITY: ?You and/or your care partner have signed paperwork which will be entered into your electronic medical record.  These signatures attest to the fact that that the information above on your  After Visit Summary has been reviewed and is understood.  Full responsibility of the confidentiality of this discharge information lies with you and/or your care-partner.  ?

## 2021-10-01 NOTE — Progress Notes (Signed)
Report given to PACU, vss 

## 2021-10-01 NOTE — Progress Notes (Signed)
Called to room to assist during endoscopic procedure.  Patient ID and intended procedure confirmed with present staff. Received instructions for my participation in the procedure from the performing physician.  

## 2021-10-01 NOTE — Op Note (Signed)
[place] [facility] ?Patient Name: Destiny Ballard ?Procedure Date: 10/01/2021 2:06 PM ?MRN: [id number] ?Endoscopist: Ladene Artist , MD ?Age: 71 ?Referring MD:  ?Date of Birth: Dec 10, 1950 ?Gender: Female ?Account #: [id number] ?Procedure:                Colonoscopy ?Indications:              Chronic diarrhea, Family history of colon cancer,  ?                          Family history of colon polyps ?Medicines:                Monitored Anesthesia Care ?Procedure:                Pre-Anesthesia Assessment: ?                          - Prior to the procedure, a History and Physical  ?                          was performed, and patient medications and  ?                          allergies were reviewed. The patient's tolerance of  ?                          previous anesthesia was also reviewed. The risks  ?                          and benefits of the procedure and the sedation  ?                          options and risks were discussed with the patient.  ?                          All questions were answered, and informed consent  ?                          was obtained. Prior Anticoagulants: The patient has  ?                          taken no previous anticoagulant or antiplatelet  ?                          agents. ASA Grade Assessment: III - A patient with  ?                          severe systemic disease. After reviewing the risks  ?                          and benefits, the patient was deemed in  ?                          satisfactory condition to undergo the procedure. ?  After obtaining informed consent, the colonoscope  ?                          was passed under direct vision. Throughout the  ?                          procedure, the patient's blood pressure, pulse, and  ?                          oxygen saturations were monitored continuously. The  ?                          CF [id number] #[id number] was introduced through the anus  ?                          and advanced to the  the cecum, identified by  ?                          appendiceal orifice and ileocecal valve. The  ?                          terminal ileum was photographed. The quality of the  ?                          bowel preparation was good. The colonoscopy was  ?                          performed without difficulty. The patient tolerated  ?                          the procedure well. ?Scope In: 2:45:10 PM ?Scope Out: 3:00:30 PM ?Scope Withdrawal Time: 0 hours 11 minutes 15 seconds  ?Total Procedure Duration: 0 hours 15 minutes 20 seconds  ?Findings:                 The perianal and digital rectal examinations were  ?                          normal. ?                          The terminal ileum appeared normal. ?                          Three sessile polyps were found in the descending  ?                          colon, ascending colon and cecum. The polyps were 6  ?                          to 10 mm in size. These polyps were removed with a  ?                          cold snare. Resection and retrieval were complete. ?  Internal hemorrhoids were found during  ?                          retroflexion. The hemorrhoids were mild and small. ?                          The exam was otherwise without abnormality on  ?                          direct and retroflexion views. Random biopsies  ?                          obtained. ?Complications:            No immediate complications. Estimated blood loss:  ?                          None. ?Estimated Blood Loss:     Estimated blood loss: none. ?Impression:               - The examined portion of the ileum was normal. ?                          - Three 6 to 10 mm polyps in the descending colon,  ?                          in the ascending colon and in the cecum, removed  ?                          with a cold snare. Resected and retrieved. ?                          - Internal hemorrhoids. ?                          - The examination was otherwise normal on  direct  ?                          and retroflexion views. ?Recommendation:           - Considr repeat colonoscopy vs no repeat due to  ?                          age, comorbidities after studies are complete for  ?                          surveillance. ?                          - Patient has a contact number available for  ?                          emergencies. The signs and symptoms of potential  ?                          delayed complications were discussed with the  ?  patient. Return to normal activities tomorrow.  ?                          Written discharge instructions were provided to the  ?                          patient. ?                          - Resume previous diet. ?                          - Continue present medications. ?                          - Imodium 1 po bid prn diarrhea. ?                          - Await pathology results. ?Ladene Artist, MD ?10/01/2021 3:15:52 PM ?This report has been signed electronically. ?

## 2021-10-03 ENCOUNTER — Telehealth: Payer: Self-pay

## 2021-10-03 NOTE — Telephone Encounter (Signed)
?  Follow up Call- ? ? ?  10/01/2021  ?  2:24 PM  ?Call back number  ?Post procedure Call Back phone  # [phone]  ?Permission to leave phone message Yes  ?  ? ?Patient questions: ? ?Do you have a fever, pain , or abdominal swelling? No. ?Pain Score  0 * ? ?Have you tolerated food without any problems? Yes.   ? ?Have you been able to return to your normal activities? Yes.   ? ?Do you have any questions about your discharge instructions: ?Diet   No. ?Medications  No. ?Follow up visit  No. ? ?Do you have questions or concerns about your Care? No. ? ?Actions: ?* If pain score is 4 or above: ?No action needed, pain <4. ? ? ? ?

## 2021-10-03 NOTE — Telephone Encounter (Signed)
Called [phone] and left a message we tried to reach pt for a follow up call. maw  ?

## 2021-10-09 ENCOUNTER — Telehealth: Payer: Self-pay

## 2021-10-09 ENCOUNTER — Telehealth: Payer: Medicare Other

## 2021-10-09 NOTE — Telephone Encounter (Signed)
?  Care Management  ? ?Follow Up Note ? ? ?10/09/2021 ?Name: Destiny Ballard MRN: [id number] DOB: 05/04/1951 ? ? ?Referred by: Destiny Mclean, MD ?Reason for referral : No chief complaint on file. ? ? ?RNCM outreached to patient to complete telephone assessment. However, Destiny Ballard states she is not home and not able to talk-Request to reschedule telephone appointment. ? ?Follow Up Plan: Telephone follow up appointment with care management team member scheduled for: 10/15/21 ? ?Destiny Silversmith, RN, MSN, BSN, CCM ?Care Management Coordinator ?[place] ?[phone]  ?

## 2021-10-10 DIAGNOSIS — Z20822 Contact with and (suspected) exposure to covid-19: Secondary | ICD-10-CM | POA: Diagnosis not present

## 2021-10-15 ENCOUNTER — Ambulatory Visit (INDEPENDENT_AMBULATORY_CARE_PROVIDER_SITE_OTHER): Payer: Medicare Other

## 2021-10-15 ENCOUNTER — Encounter: Payer: Self-pay | Admitting: Gastroenterology

## 2021-10-15 DIAGNOSIS — E119 Type 2 diabetes mellitus without complications: Secondary | ICD-10-CM

## 2021-10-15 DIAGNOSIS — J449 Chronic obstructive pulmonary disease, unspecified: Secondary | ICD-10-CM

## 2021-10-15 DIAGNOSIS — I5032 Chronic diastolic (congestive) heart failure: Secondary | ICD-10-CM

## 2021-10-15 NOTE — Patient Instructions (Addendum)
Visit Information ? ?Thank you for taking time to visit with me today. Please don't hesitate to contact me if I can be of assistance to you before our next scheduled telephone appointment. ? ?Following are the goals we discussed today:  ?Patient Goals/Self-Care Activities: ?Call Dr. Kelton Pillar to reschedule your follow up appointment ?Take medications as prescribed   ?Attend all scheduled provider appointments ?Continue to follow the COPD action plan ?Continue to check Blood sugars per provider recommendation and take blood sugar log to office visits. Notify provider if consistently outside recommended parameters ?Call your provider with any Health questions or concerns ?Continue to work on eating a healthy diet: protein rich foods, fruits, vegetables, low salt. Cut fat from your meat and eat whole grain breads and cereals. Avoid greasy, processed food or foods that contain saturated fats and trans fats ?Continue to work with care management team for ongoing care coordination  and care management needs ? ?Our next appointment is by telephone on 01/17/22 at 10 am ? ?Please call the care guide team at [phone] if you need to cancel or reschedule your appointment.  ? ?If you are experiencing a Mental Health or [place] or need someone to talk to, please call the Suicide and Crisis Lifeline: 988 ?call 1-800-273-TALK (toll free, 24 hour hotline)  ? ?Patient verbalizes understanding of instructions and care plan provided today and agrees to view in [place]. Active MyChart status confirmed with patient.   ? ?Thea Silversmith, RN, MSN, BSN, CCM ?Care Management Coordinator ?[facility] ?[phone] ? ? ?Diabetes Mellitus and Standards of Medical Care ?Living with and managing diabetes (diabetes mellitus) can be complicated. Your diabetes treatment may be managed by a team of health care providers, including: ?A physician who specializes in diabetes (endocrinologist). You might also have visits  with a nurse practitioner or physician assistant. ?Nurses. ?A registered dietitian. ?A certified diabetes care and education specialist. ?An exercise specialist. ?A pharmacist. ?An eye doctor. ?A foot specialist (podiatrist). ?A dental care provider. ?A primary care provider. ?A mental health care provider. ?How to manage your diabetes ?You can do many things to successfully manage your diabetes. Your health care providers will follow guidelines to help you get the best quality of care. Here are general guidelines for your diabetes management plan. Your health care providers may give you more specific instructions. ?Physical exams ?When you are diagnosed with diabetes, and each year after that, your health care provider will ask about your medical and family history. You will have a physical exam, which may include: ?Measuring your height, weight, and body mass index (BMI). ?Checking your blood pressure. This will be done at every routine medical visit. Your target blood pressure may vary depending on your medical conditions, your age, and other factors. ?A thyroid exam. ?A skin exam. ?Screening for nerve damage (peripheral neuropathy). This may include checking the pulse in your legs and feet and the level of sensation in your hands and feet. ?A foot exam to inspect the structure and skin of your feet, including checking for cuts, bruises, redness, blisters, sores, or other problems. ?Screening for blood vessel (vascular) problems. This may include checking the pulse in your legs and feet and checking your temperature. ?Blood tests ?Depending on your treatment plan and your personal needs, you may have the following tests: ?Hemoglobin A1C (HbA1C). This test provides information about blood sugar (glucose) control over the previous 2-3 months. It is used to adjust your treatment plan, if needed. This test will  be done: ?At least 2 times a year, if you are meeting your treatment goals. ?4 times a year, if you are not  meeting your treatment goals or if your goals have changed. ?Lipid testing, including total cholesterol, LDL and HDL cholesterol, and triglyceride levels. ?The goal for LDL is less than 100 mg/dL (5.5 mmol/L). If you are at high risk for complications, the goal is less than 70 mg/dL (3.9 mmol/L). ?The goal for HDL is 40 mg/dL (2.2 mmol/L) or higher for men, and 50 mg/dL (2.8 mmol/L) or higher for women. An HDL cholesterol of 60 mg/dL (3.3 mmol/L) or higher gives some protection against heart disease. ?The goal for triglycerides is less than 150 mg/dL (8.3 mmol/L). ?Liver function tests. ?Kidney function tests. ?Thyroid function tests. ? ?Dental and eye exams ? ?Visit your dentist two times a year. ?If you have type 1 diabetes, your health care provider may recommend an eye exam within 5 years after you are diagnosed, and then once a year after your first exam. ?For children with type 1 diabetes, the health care provider may recommend an eye exam when your child is age 81 or older and has had diabetes for 3-5 years. After the first exam, your child should get an eye exam once a year. ?If you have type 2 diabetes, your health care provider may recommend an eye exam as soon as you are diagnosed, and then every 1-2 years after your first exam. ?Immunizations ?A yearly flu (influenza) vaccine is recommended annually for everyone 6 months or older. This is especially important if you have diabetes. ?The pneumonia (pneumococcal) vaccine is recommended for everyone 2 years or older who has diabetes. If you are age 70 or older, you may get the pneumonia vaccine as a series of two separate shots. ?The hepatitis B vaccine is recommended for adults shortly after being diagnosed with diabetes. ?Adults and children with diabetes should receive all other vaccines according to age-specific recommendations from the Centers for Disease Control and Prevention (CDC). ?Mental and emotional health ?Screening for symptoms of eating  disorders, anxiety, and depression is recommended at the time of diagnosis and after as needed. If your screening shows that you have symptoms, you may need more evaluation. You may work with a mental health care provider. ?Follow these instructions at home: ?Treatment plan ?You will monitor your blood glucose levels and may give yourself insulin. Your treatment plan will be reviewed at every medical visit. You and your health care provider will discuss: ?How you are taking your medicines, including insulin. ?Any side effects you have. ?Your blood glucose level target goals. ?How often you monitor your blood glucose level. ?Lifestyle habits, such as activity level and tobacco, alcohol, and substance use. ?Education ?Your health care provider will assess how well you are monitoring your blood glucose levels and whether you are taking your insulin and medicines correctly. He or she may refer you to: ?A certified diabetes care and education specialist to manage your diabetes throughout your life, starting at diagnosis. ?A registered dietitian who can create and review your personal nutrition plan. ?An exercise specialist who can discuss your activity level and exercise plan. ?General instructions ?Take over-the-counter and prescription medicines only as told by your health care provider. ?Keep all follow-up visits. This is important. ?Where to find support ?There are many diabetes support networks, including: ?American Diabetes Association (ADA): diabetes.org ?Defeat Diabetes Foundation: defeatdiabetes.org ?Where to find more information ?American Diabetes Association (ADA): www.diabetes.org ?Association of Diabetes Care & Education  Specialists (ADCES): diabeteseducator.org ?International Diabetes Federation (IDF): https://www.munoz-bell.org/ ?Summary ?Managing diabetes (diabetes mellitus) can be complicated. Your diabetes treatment may be managed by a team of health care providers. ?Your health care providers follow guidelines to help  you get the best quality care. ?You should have physical exams, blood tests, blood pressure monitoring, immunizations, and screening tests regularly. Stay updated on how to manage your diabetes. ?Your health

## 2021-10-15 NOTE — Chronic Care Management (AMB) (Signed)
?Chronic Care Management  ? ?CCM RN Visit Note ? ?10/15/2021 ?Name: Destiny Ballard MRN: [id number] DOB: 1951/03/24 ? ?Subjective: ?Destiny Ballard is a 71 y.o. year old female who is a primary care patient of Copland, Gay Filler, MD. The care management team was consulted for assistance with disease management and care coordination needs.   ? ?Engaged with patient by telephone for follow up visit in response to provider referral for case management and/or care coordination services.  ? ?Consent to Services:  ?The patient was given information about Chronic Care Management services, agreed to services, and gave verbal consent prior to initiation of services.  Please see initial visit note for detailed documentation.  ? ?Patient agreed to services and verbal consent obtained.  ? ?Assessment: Review of patient past medical history, allergies, medications, health status, including review of consultants reports, laboratory and other test data, was performed as part of comprehensive evaluation and provision of chronic care management services.  ? ?SDOH (Social Determinants of Health) assessments and interventions performed:   ? ?CCM Care Plan ? ?Allergies  ?Allergen Reactions  ? Prednisone Other (See Comments)  ?  REACTION: mood swings, nightmares. "Shot doesn't bother me, reaction is just with the pill" she states she has had the steroid injections before. From our records methylprednisone was given to her in 2013 without any complications.  ? ? ?Outpatient Encounter Medications as of 10/15/2021  ?Medication Sig  ? albuterol (VENTOLIN HFA) 108 (90 Base) MCG/ACT inhaler INHALE 2 PUFFS INTO THE LUNGS EVERY EIGHT HOURS AS NEEDED FOR WHEEZING OR SHORTNESS OF BREATH.  ? aspirin EC 81 MG tablet Take 81 mg by mouth daily.  ? calcium carbonate (TUMS EX) 750 MG chewable tablet Chew 1 tablet by mouth daily as needed for heartburn.  ? Cholecalciferol (VITAMIN D3) 1000 UNITS CAPS Take 1,000 Units by mouth daily.  ? dapagliflozin  propanediol (FARXIGA) 10 MG TABS tablet Take 1 tablet (10 mg total) by mouth daily.  ? dicyclomine (BENTYL) 10 MG capsule Take 1 capsule (10 mg total) by mouth 4 (four) times daily -  before meals and at bedtime.  ? Dulaglutide (TRULICITY) 3 PP/5.0DT SOPN Inject 3 mg into the skin once a week.  ? fexofenadine (ALLEGRA) 180 MG tablet Take 1 tablet by mouth daily.  ? FLUoxetine (PROZAC) 40 MG capsule TAKE 1 CAPSULE BY MOUTH EVERY DAY  ? fluticasone (FLONASE) 50 MCG/ACT nasal spray Place 2 sprays into both nostrils as needed for allergies or rhinitis.  ? furosemide (LASIX) 20 MG tablet TAKE 1 TABLET BY MOUTH EVERY DAY  ? gabapentin (NEURONTIN) 300 MG capsule TAKE 2 CAPSULES BY MOUTH TWICE A DAY  ? hydrALAZINE (APRESOLINE) 25 MG tablet Take 2 tablets (50 mg total) by mouth 2 (two) times daily. For blood pressure  ? Insulin Glargine (BASAGLAR KWIKPEN) 100 UNIT/ML Inject 30 Units into the skin daily. Profile until patient requests (Patient taking differently: Inject 30 Units into the skin daily as needed. Profile until patient requests)  ? loperamide (IMODIUM A-D) 2 MG tablet Take 1 tablet (2 mg total) by mouth 2 (two) times daily as needed for diarrhea or loose stools.  ? LORazepam (ATIVAN) 0.5 MG tablet Take 1 tablet (0.5 mg total) by mouth 2 (two) times daily as needed for anxiety.  ? magnesium oxide (MAG-OX) 400 MG tablet Take 400 mg by mouth daily.  ? metFORMIN (GLUCOPHAGE-XR) 500 MG 24 hr tablet TAKE 2 TABLETS BY MOUTH TWICE A DAY  ? metoprolol succinate (TOPROL-XL) 100 MG  24 hr tablet TAKE 1 TABLET BY MOUTH EVERY DAY  ? montelukast (SINGULAIR) 10 MG tablet Take 1 tablet (10 mg total) by mouth at bedtime. For allergies / asthma  ? nitroGLYCERIN (NITROSTAT) 0.4 MG SL tablet Place 1 tablet (0.4 mg total) under the tongue every 5 (five) minutes as needed for chest pain.  ? rosuvastatin (CRESTOR) 20 MG tablet Take 1 tablet (20 mg total) by mouth daily. For cholesterol / heart  ? Tiotropium Bromide Monohydrate (SPIRIVA  RESPIMAT) 2.5 MCG/ACT AERS Inhale 2 puffs into the lungs daily.  ? traZODone (DESYREL) 50 MG tablet TAKE 1/2 TO 1 TABLET BY MOUTH AT BEDTIME AS NEEDED FOR SLEEP  ? valsartan (DIOVAN) 160 MG tablet Take 1 tablet (160 mg total) by mouth daily.  ? vitamin E 180 MG (400 UNITS) capsule Take 400 Units by mouth daily.  ? zinc gluconate 50 MG tablet Take 50 mg by mouth daily.  ? Insulin Pen Needle 32G X 4 MM MISC 1 Device by Does not apply route daily.  ? Potassium 99 MG TABS Take 1 tablet by mouth daily. (Patient not taking: Reported on 10/01/2021)  ? ?No facility-administered encounter medications on file as of 10/15/2021.  ? ? ?Patient Active Problem List  ? Diagnosis Date Noted  ? Osteopenia 06/27/2021  ? Melena 12/18/2020  ? History of colonic polyps 12/18/2020  ? Constipation 12/18/2020  ? Obesity   ? Iron deficiency anemia   ? Insulin resistance   ? Hyperlipidemia   ? Cervical spondylosis   ? Anxiety   ? Pneumonia due to COVID-19 virus 08/21/2020  ? Hyperlipidemia associated with type 2 diabetes mellitus ([place]) 08/21/2020  ? Type 2 diabetes mellitus with diabetic polyneuropathy, with long-term current use of insulin ([place]) 12/10/2019  ? 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  ? Tinnitus of both ears 11/18/2019  ? 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 Eustachian tube dysfunction, bilateral 11/18/2019  ? Chronic eczematous otitis externa of both ears 11/18/2019  ? Obesity (BMI 30-39.9) 08/24/2018  ? Depression with anxiety 08/24/2018  ? CAD (coronary artery disease) 08/24/2018  ? Abnormal cardiovascular stress test 08/24/2018  ? Precordial chest pain   ? Sepsis ([place]) 09/18/2017  ? Chronic diastolic CHF (congestive heart failure) ([place]) 09/18/2017  ? Chronic rhinitis 08/07/2016  ? COPD with acute exacerbation ([place]) 04/25/2016  ? Hypertension associated with diabetes ([place]) 01/30/2016  ? OSA (obstructive sleep apnea) 05/06/2014  ? Dyspnea on exertion 09/02/2012  ? GERD 06/28/2008   ? ? ?Conditions to be addressed/monitored:CHF, COPD, and DMII ? ?Care Plan : RN Care Manager Plan of Care  ?Updates made by Luretha Rued, RN since 10/15/2021 12:00 AM  ?  ? ?Problem: Chronic disease Management education and/or care coordination needs   ?Priority: High  ?  ? ?Long-Range Goal: Developement of Plan of Care for Chronic Disease Management   ?Start Date: 08/13/2021  ?Expected End Date: 02/10/2022  ?Priority: High  ?Note:   ?Current Barriers: Reports blood sugar this morning was 150 before meals, but acknowledges she ate  a strawberry creme pie around midnight and has not taken her morning medications yet. She denies any questions or concerns. Last scheduled visit 06/2021 was canceled-needs to reschedule office visit with endocrinologist.  ?Knowledge Deficits related to plan of care for management of CHF, COPD, and DMII  ?Chronic Disease Management support and education needs related to CHF, COPD, and DMII ? ?RNCM Clinical Goal(s):  ?Patient will verbalize understanding of plan for management  of CHF, COPD, and DMII as evidenced by self report and/or chart notation  through collaboration with RN Care manager, provider, and care team.  ? ?Interventions: ?1:1 collaboration with primary care provider regarding development and update of comprehensive plan of care as evidenced by provider attestation and co-signature ?Inter-disciplinary care team collaboration (see longitudinal plan of care) ?Evaluation of current treatment plan related to  self management and patient's adherence to plan as established by provider ? ?Heart Failure Interventions:  (Status:  New goal.) Long Term Goal ?Discussed the importance of keeping all appointments with provider ?Reinforced the importance of taking medications as prescribed ?Reiterated signs/symptoms of heart failure exacerbation ? ?COPD Interventions:  (Status:  Goal on track:  Yes.) Long Term Goal ?Medications reviewed and encouraged to continue to take as  prescribed ?Encouraged to attend provider visit as scheduled ?Encouraged to continue to track and manage triggers ? ?Diabetes Interventions:  (Status:  Goal on track:  Yes.) Long Term Goal ?Assessed patient's understanding of A

## 2021-10-16 ENCOUNTER — Ambulatory Visit (INDEPENDENT_AMBULATORY_CARE_PROVIDER_SITE_OTHER): Payer: Medicare Other | Admitting: Podiatry

## 2021-10-16 DIAGNOSIS — E1142 Type 2 diabetes mellitus with diabetic polyneuropathy: Secondary | ICD-10-CM | POA: Diagnosis not present

## 2021-10-16 DIAGNOSIS — Z794 Long term (current) use of insulin: Secondary | ICD-10-CM

## 2021-10-16 DIAGNOSIS — L600 Ingrowing nail: Secondary | ICD-10-CM

## 2021-10-16 DIAGNOSIS — L03032 Cellulitis of left toe: Secondary | ICD-10-CM | POA: Diagnosis not present

## 2021-10-16 MED ORDER — NEOMYCIN-POLYMYXIN-HC 1 % OT SOLN: OTIC | 0 refills | Status: DC

## 2021-10-16 MED ORDER — CEFADROXIL 500 MG PO CAPS: 500.0000 mg | ORAL_CAPSULE | Freq: Two times a day (BID) | ORAL | 0 refills | Status: AC

## 2021-10-16 NOTE — Patient Instructions (Signed)

## 2021-10-20 ENCOUNTER — Encounter: Payer: Self-pay | Admitting: Podiatry

## 2021-10-20 NOTE — Progress Notes (Signed)
?  Subjective:  ?Patient ID: Destiny Ballard, female    DOB: 1951/05/27,  MRN: [id number] ? ?Chief Complaint  ?Patient presents with  ? Nail Problem  ?  LEFT HALLUX- [POSSIBLE INGROWN   ? ? ?71 y.o. female presents with the above complaint. History confirmed with patient.  She has had ingrown toenails for several years.  Most of this in the lateral border the left hallux.  Is very tender and she is unable to apply pressure. ? ?Objective:  ?Physical Exam: ?warm, good capillary refill, no trophic changes or ulcerative lesions, normal DP and PT pulses, normal sensory exam, and ingrowing lateral border left hallux nail, erythema and tenderness, slight edema here as well. ? ?Assessment:  ? ?1. Ingrowing left great toenail   ?2. Paronychia of toenail of left foot   ?3. Type 2 diabetes mellitus with diabetic polyneuropathy, with long-term current use of insulin ([place])   ? ? ? ?Plan:  ?Patient was evaluated and treated and all questions answered. ? ? ? ?Ingrown Nail, left ?-Patient elects to proceed with minor surgery to remove ingrown toenail today. Consent reviewed and signed by patient. ?-Ingrown nail excised. See procedure note. ?-Educated on post-procedure care including soaking. Written instructions provided and reviewed. ?Suspect she has a slight paronychia developing as well.  With her type 2 diabetes she is at risk of infection which we discussed.  I prescribed her Keflex as a precaution.  She will take this and I will see her back in 3 weeks for follow-up ? ? ?Procedure: Excision of Ingrown Toenail ?Location: Left 1st toe lateral nail borders. ?Anesthesia: Lidocaine 1% plain; 1.5 mL and Marcaine 0.5% plain; 1.5 mL, digital block. ?Skin Prep: Betadine. ?Dressing: Silvadene; telfa; dry, sterile, compression dressing. ?Technique: Following skin prep, the toe was exsanguinated and a tourniquet was secured at the base of the toe. The affected nail border was freed, split with a nail splitter, and excised. Chemical  matrixectomy was then performed with phenol and irrigated out with alcohol. The tourniquet was then removed and sterile dressing applied. ?Disposition: Patient tolerated procedure well.  ? ?Return in about 3 weeks (around 11/06/2021) for nail re-check.  ? ?

## 2021-11-01 DIAGNOSIS — Z20822 Contact with and (suspected) exposure to covid-19: Secondary | ICD-10-CM | POA: Diagnosis not present

## 2021-11-06 ENCOUNTER — Ambulatory Visit: Payer: 59 | Admitting: Podiatry

## 2021-11-10 NOTE — Progress Notes (Signed)
Therapist, music at [facility] ?[address], [address] ?[place], [place] [address] ?336 [phone] ?Fax [phone] ? ?Date:  11/12/2021  ? ?Name:  Destiny Ballard   DOB:  [birth year]   MRN:  [id number] ? ?PCP:  Darreld Mclean, MD  ? ? ?Chief Complaint: Back Pain (Pain present for a while. ) ? ? ?History of Present Illness: ? ?Destiny Ballard is a 71 y.o. very pleasant female patient who presents with the following: ? ?Pt seen today with concern of not feeling well- history of CAD with cardiology care, hypertension, CHF, sleep apnea, COPD, diabetes, GERD, hyperlipidemia ? ?Last seen by myself in February ? ?Foot exam due-update today ?Covid booster ? ?Lab Results  ?Component Value Date  ? HGBA1C 7.3 (H) 08/22/2021  ? ?Concern today is actually a skin issue in the gluteal cleft.  Patient notes she had her colonoscopy in March-we think that doing the prep may have caused some irritation of the skin in the gluteal cleft.  She has been applying Vaseline but has not been able to get it to heal.  She notes it is tender. ?Bowel movements are normal ? ?Patient Active Problem List  ? Diagnosis Date Noted  ? Osteopenia 06/27/2021  ? Melena 12/18/2020  ? History of colonic polyps 12/18/2020  ? Constipation 12/18/2020  ? Obesity   ? Iron deficiency anemia   ? Insulin resistance   ? Hyperlipidemia   ? Cervical spondylosis   ? Anxiety   ? Pneumonia due to COVID-19 virus 08/21/2020  ? Hyperlipidemia associated with type 2 diabetes mellitus ([place]) 08/21/2020  ? Type 2 diabetes mellitus with diabetic polyneuropathy, with long-term current use of insulin ([place]) 12/10/2019  ? 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  ? Tinnitus of both ears 11/18/2019  ? 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 Eustachian tube dysfunction, bilateral 11/18/2019  ? Chronic eczematous otitis externa of both ears 11/18/2019  ? Obesity (BMI 30-39.9) 08/24/2018  ? Depression with anxiety 08/24/2018  ?  CAD (coronary artery disease) 08/24/2018  ? Abnormal cardiovascular stress test 08/24/2018  ? Precordial chest pain   ? Sepsis ([place]) 09/18/2017  ? Chronic diastolic CHF (congestive heart failure) ([place]) 09/18/2017  ? Chronic rhinitis 08/07/2016  ? COPD with acute exacerbation ([place]) 04/25/2016  ? Hypertension associated with diabetes ([place]) 01/30/2016  ? OSA (obstructive sleep apnea) 05/06/2014  ? Dyspnea on exertion 09/02/2012  ? GERD 06/28/2008  ? ? ?Past Medical History:  ?Diagnosis Date  ? Anxiety   ? Prior suicide attempt  ? CAD (coronary artery disease)   ? a) s/p DES to LAD 07/2005 b) Last Myoview low risk 11/2011 showing small fixed apical perfusion defect (prior MI vs attenuation) but no ischemia - normal EF.  ? Cervical spondylosis   ? Chest pain 12/10/2011  ? Chronic diastolic CHF (congestive heart failure) ([place]) 09/18/2017  ? Chronic eczematous otitis externa of both ears 11/18/2019  ? Chronic rhinitis 08/07/2016  ? Constipation 12/18/2020  ? COPD with acute exacerbation ([place]) 04/25/2016  ? Coronary atherosclerosis 06/28/2008  ? Depression   ? Depression with anxiety   ? Diabetes mellitus ([place]) 09/08/2017  ? Diabetes mellitus without complication ([place])   ? Dyspnea on exertion 09/02/2012  ? CXR 07/2012:  No acute process.    ? Eustachian tube dysfunction, bilateral 11/18/2019  ? GERD 06/28/2008  ? GERD (gastroesophageal reflux disease)   ? History of colonic polyps 12/18/2020  ? HLD (hyperlipidemia)   ?  Hyperlipidemia   ? Hyperlipidemia associated with type 2 diabetes mellitus ([place]) 08/21/2020  ? Hypertension   ? Hypertension associated with diabetes ([place]) 01/30/2016  ? Hypokalemia 01/27/2016  ? Insulin resistance   ? Iron deficiency anemia   ? Lobar pneumonia, unspecified organism ([place]) 11/13/2017  ? Melena 12/18/2020  ? 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  ? Obesity   ? Obesity (BMI 30-39.9) 08/24/2018  ? Last Assessment & Plan:  Exercise and weight reduction is encouraged.  ? OSA (obstructive  sleep apnea) 05/06/2014  ? Pneumonia due to COVID-19 virus 08/21/2020  ? Precordial chest pain   ? Sepsis ([place]) 09/18/2017  ? 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 Tinnitus of both ears 11/18/2019  ? Type 2 diabetes mellitus with diabetic polyneuropathy, with long-term current use of insulin ([place]) 12/10/2019  ? Type 2 diabetes mellitus with hyperglycemia, with long-term current use of insulin ([place]) 12/10/2019  ? Uncontrolled type 2 diabetes mellitus with complication, with long-term current use of insulin 03/14/2020  ? ? ?Past Surgical History:  ?Procedure Laterality Date  ? BREAST ENHANCEMENT SURGERY    ? CARDIAC CATHETERIZATION  06/17/2007  ? NORMAL. EF 60%  ? CARDIAC CATHETERIZATION N/A 01/29/2016  ? Procedure: Left Heart Cath and Coronary Angiography;  Surgeon: Sherren Mocha, MD;  Location: [facility];  Service: Cardiovascular;  Laterality: N/A;  ? CERVICAL SPONDYLOSIS    ? SINGLE LEVEL FUSION  ? CHILDBIRTH    ? X3  ? COLONOSCOPY  2010  ? normal  ? COLONOSCOPY WITH PROPOFOL  10/01/2021  ? CORONARY STENT PLACEMENT  07/15/2005  ? LEFT ANTERIOR DESCENDING  ? FOREARM FRACTURE SURGERY  07/15/2008  ? hand and shoulder   ? INCISION AND DRAINAGE BREAST ABSCESS  01/05/2012  ?    ? INCISION AND DRAINAGE PERIRECTAL ABSCESS N/A 02/18/2014  ? Procedure: IRRIGATION AND DEBRIDEMENT PERIRECTAL ABSCESS;  Surgeon: Pedro Earls, MD;  Location: [facility];  Service: General;  Laterality: N/A;  ? LEFT HEART CATH AND CORONARY ANGIOGRAPHY N/A 10/10/2017  ? Procedure: LEFT HEART CATH AND CORONARY ANGIOGRAPHY;  Surgeon: Burnell Blanks, MD;  Location: [facility];  Service: Cardiovascular;  Laterality: N/A;  ? LUMBAR LAMINECTOMY    ? ROTATOR CUFF REPAIR    ? bilaterla  ? TONSILLECTOMY AND ADENOIDECTOMY    ? TUBAL LIGATION    ? VIDEO BRONCHOSCOPY Bilateral 08/13/2016  ? Procedure: VIDEO BRONCHOSCOPY WITHOUT FLUORO;  Surgeon: Collene Gobble, MD;  Location: [facility];  Service: Cardiopulmonary;  Laterality:  Bilateral;  ? ? ?Social History  ? ?Tobacco Use  ? Smoking status: Never  ?  Passive exposure: Yes  ? Smokeless tobacco: Never  ?Vaping Use  ? Vaping Use: Never used  ?Substance Use Topics  ? Alcohol use: Yes  ?  Comment: occ  ? Drug use: No  ? ? ?Family History  ?Problem Relation Age of Onset  ? Heart attack Mother   ? Diabetes Mother   ? Lung cancer Mother   ? Asthma Mother   ? Heart disease Mother   ? Suicidality Father   ?     "killed himself"  ? Diabetes Sister   ? Cancer Sister   ? Colon cancer Sister 46  ? Asthma Daughter   ?     x 2  ? Cancer Daughter   ?     pre-cancerous polyp  ? Cervical cancer Daughter   ?     cervical   ?  Allergies Other   ?     all family--seasonal allergies  ? ? ?Allergies  ?Allergen Reactions  ? Prednisone Other (See Comments)  ?  REACTION: mood swings, nightmares. "Shot doesn't bother me, reaction is just with the pill" she states she has had the steroid injections before. From our records methylprednisone was given to her in 2013 without any complications.  ? ? ?Medication list has been reviewed and updated. ? ?Current Outpatient Medications on File Prior to Visit  ?Medication Sig Dispense Refill  ? albuterol (VENTOLIN HFA) 108 (90 Base) MCG/ACT inhaler INHALE 2 PUFFS INTO THE LUNGS EVERY EIGHT HOURS AS NEEDED FOR WHEEZING OR SHORTNESS OF BREATH. 18 g 0  ? aspirin EC 81 MG tablet Take 81 mg by mouth daily.    ? calcium carbonate (TUMS EX) 750 MG chewable tablet Chew 1 tablet by mouth daily as needed for heartburn.    ? Cholecalciferol (VITAMIN D3) 1000 UNITS CAPS Take 1,000 Units by mouth daily.    ? dapagliflozin propanediol (FARXIGA) 10 MG TABS tablet Take 1 tablet (10 mg total) by mouth daily. 90 tablet 3  ? Dulaglutide (TRULICITY) 3 ZO/1.0RU SOPN Inject 3 mg into the skin once a week.    ? fexofenadine (ALLEGRA) 180 MG tablet Take 1 tablet by mouth daily.    ? FLUoxetine (PROZAC) 40 MG capsule TAKE 1 CAPSULE BY MOUTH EVERY DAY 90 capsule 1  ? fluticasone (FLONASE) 50 MCG/ACT  nasal spray Place 2 sprays into both nostrils as needed for allergies or rhinitis.    ? furosemide (LASIX) 20 MG tablet TAKE 1 TABLET BY MOUTH EVERY DAY 90 tablet 2  ? gabapentin (NEURONTIN) 300 MG capsule TAKE 2 C

## 2021-11-11 DIAGNOSIS — E119 Type 2 diabetes mellitus without complications: Secondary | ICD-10-CM | POA: Diagnosis not present

## 2021-11-11 DIAGNOSIS — I5032 Chronic diastolic (congestive) heart failure: Secondary | ICD-10-CM | POA: Diagnosis not present

## 2021-11-11 DIAGNOSIS — J449 Chronic obstructive pulmonary disease, unspecified: Secondary | ICD-10-CM | POA: Diagnosis not present

## 2021-11-12 ENCOUNTER — Ambulatory Visit (INDEPENDENT_AMBULATORY_CARE_PROVIDER_SITE_OTHER): Payer: Medicare Other | Admitting: Family Medicine

## 2021-11-12 ENCOUNTER — Other Ambulatory Visit: Payer: Self-pay | Admitting: Family Medicine

## 2021-11-12 VITALS — BP 122/60 | HR 85 | Temp 97.9°F | Resp 18 | Ht 59.0 in | Wt 165.4 lb

## 2021-11-12 DIAGNOSIS — L98491 Non-pressure chronic ulcer of skin of other sites limited to breakdown of skin: Secondary | ICD-10-CM

## 2021-11-12 DIAGNOSIS — I25118 Atherosclerotic heart disease of native coronary artery with other forms of angina pectoris: Secondary | ICD-10-CM

## 2021-11-12 NOTE — Patient Instructions (Signed)
Let's try a protective cream like desitin on your area a few times a day- I am hopeful this will get you healed   ?However, if not having success a gel dressing like Duoderm CGF patches can be helpful- you probably will need help applying them however ?Please let me know if not getting better over the next couple of weeks ? ?Let's visit in 2 months for labs  ?

## 2021-11-13 DIAGNOSIS — Z20822 Contact with and (suspected) exposure to covid-19: Secondary | ICD-10-CM | POA: Diagnosis not present

## 2021-11-15 ENCOUNTER — Ambulatory Visit (INDEPENDENT_AMBULATORY_CARE_PROVIDER_SITE_OTHER): Payer: Medicare Other | Admitting: Podiatry

## 2021-11-15 DIAGNOSIS — L600 Ingrowing nail: Secondary | ICD-10-CM | POA: Diagnosis not present

## 2021-11-19 DIAGNOSIS — Z20822 Contact with and (suspected) exposure to covid-19: Secondary | ICD-10-CM | POA: Diagnosis not present

## 2021-11-20 NOTE — Progress Notes (Signed)
?  Subjective:  ?Patient ID: Destiny Ballard, female    DOB: Sep 25, 1950,  MRN: [id number] ? ?Chief Complaint  ?Patient presents with  ? Follow-up  ?  nail re-check.  ? ? ?71 y.o. female presents with the above complaint. History confirmed with patient.  She is doing well not having much pain ? ?Objective:  ?Physical Exam: ?warm, good capillary refill, no trophic changes or ulcerative lesions, normal DP and PT pulses, normal sensory exam, and left hallux nail matricectomy site is doing well ? ?Assessment:  ? ?1. Ingrowing left great toenail   ? ? ? ?Plan:  ?Patient was evaluated and treated and all questions answered. ? ? ? ?Ingrown Nail, left ?-Matricectomy site is healing well she can leave this open to air at this point.  Can discontinue soaks and ointments.  Return as needed. ? ?No follow-ups on file.  ? ?

## 2021-11-21 ENCOUNTER — Other Ambulatory Visit: Payer: Self-pay

## 2021-11-21 ENCOUNTER — Emergency Department (HOSPITAL_COMMUNITY)
Admission: EM | Admit: 2021-11-21 | Discharge: 2021-11-21 | Disposition: A | Discharge status: Home / Self Care | Payer: Medicare Other | Attending: Emergency Medicine | Admitting: Emergency Medicine

## 2021-11-21 ENCOUNTER — Encounter (HOSPITAL_COMMUNITY): Payer: Self-pay

## 2021-11-21 ENCOUNTER — Emergency Department (HOSPITAL_COMMUNITY): Payer: Medicare Other

## 2021-11-21 DIAGNOSIS — Z7982 Long term (current) use of aspirin: Secondary | ICD-10-CM | POA: Insufficient documentation

## 2021-11-21 DIAGNOSIS — S32029A Unspecified fracture of second lumbar vertebra, initial encounter for closed fracture: Secondary | ICD-10-CM | POA: Diagnosis not present

## 2021-11-21 DIAGNOSIS — W1839XA Other fall on same level, initial encounter: Secondary | ICD-10-CM | POA: Diagnosis not present

## 2021-11-21 DIAGNOSIS — S79911A Unspecified injury of right hip, initial encounter: Secondary | ICD-10-CM | POA: Diagnosis not present

## 2021-11-21 DIAGNOSIS — M25531 Pain in right wrist: Secondary | ICD-10-CM | POA: Insufficient documentation

## 2021-11-21 DIAGNOSIS — S6291XA Unspecified fracture of right wrist and hand, initial encounter for closed fracture: Secondary | ICD-10-CM | POA: Diagnosis not present

## 2021-11-21 DIAGNOSIS — S62101A Fracture of unspecified carpal bone, right wrist, initial encounter for closed fracture: Secondary | ICD-10-CM

## 2021-11-21 DIAGNOSIS — Y9389 Activity, other specified: Secondary | ICD-10-CM | POA: Diagnosis not present

## 2021-11-21 DIAGNOSIS — M25551 Pain in right hip: Secondary | ICD-10-CM | POA: Diagnosis not present

## 2021-11-21 DIAGNOSIS — S52501A Unspecified fracture of the lower end of right radius, initial encounter for closed fracture: Secondary | ICD-10-CM | POA: Insufficient documentation

## 2021-11-21 DIAGNOSIS — S3992XA Unspecified injury of lower back, initial encounter: Secondary | ICD-10-CM | POA: Diagnosis present

## 2021-11-21 DIAGNOSIS — S32028A Other fracture of second lumbar vertebra, initial encounter for closed fracture: Secondary | ICD-10-CM | POA: Diagnosis not present

## 2021-11-21 DIAGNOSIS — M545 Low back pain, unspecified: Secondary | ICD-10-CM | POA: Diagnosis not present

## 2021-11-21 DIAGNOSIS — W19XXXA Unspecified fall, initial encounter: Secondary | ICD-10-CM

## 2021-11-21 MED ORDER — OXYCODONE-ACETAMINOPHEN 5-325 MG PO TABS: 1.0000 | ORAL_TABLET | Freq: Once | ORAL | Status: AC

## 2021-11-21 MED ORDER — HYDROCODONE-ACETAMINOPHEN 5-325 MG PO TABS
1.0000 | ORAL_TABLET | Freq: Once | ORAL | Status: AC
  Administered 2021-11-21: 1 via ORAL
  Filled 2021-11-21: qty 1

## 2021-11-21 MED ORDER — OXYCODONE-ACETAMINOPHEN 5-325 MG PO TABS: 1.0000 | ORAL_TABLET | Freq: Four times a day (QID) | ORAL | 0 refills | Status: DC | PRN

## 2021-11-21 MED ORDER — OXYCODONE-ACETAMINOPHEN 5-325 MG PO TABS
ORAL_TABLET | ORAL | Status: AC
  Administered 2021-11-21: 1 via ORAL
  Filled 2021-11-21: qty 1

## 2021-11-21 NOTE — ED Triage Notes (Signed)
Playing ball with grandkids, tried to kick the ball with her right foot, fell on her right hip and right arm onto the concrete. Pain now in right arm at wrist, slightly swollen, sensation and movement intact, lower part of back hurts form one hip to the other. Unable to stand or walk she states. ?

## 2021-11-21 NOTE — ED Provider Triage Note (Addendum)
Emergency Medicine Provider Triage Evaluation Note ? ?Destiny Ballard , a 71 y.o. female  was evaluated in triage.  Pt complains of low back, hip, and right wrist pain following fall while playing soccer with her great grandkids.  She states she was tried to kick a ball when she slipped and fell.  Patient fell onto cement to the ground.  Denies head injury, loss of consciousness, or use of anticoagulation. ? ?Review of Systems  ?Positive: As above ?Negative: As above ? ?Physical Exam  ?BP (!) 156/66   Pulse 81   Temp 98 ?F (36.7 ?C) (Oral)   Resp 16   SpO2 94%  ?Gen:   Awake, no distress   ?Resp:  Normal effort  ?MSK:   Significant amount of tenderness to palpation present over her lumbar spine.  Mild tenderness to palpation over the right hip.  Preserved range of motion of the right lower extremity including right hip.  2+ DP pulse present.  Ankle and knee without tenderness to palpation.  Right wrist with significant tenderness to palpation.  2+ radial and ulnar pulse present.  Full range of motion in all digits of the right hand.  Swelling noted to the right wrist.  Cervical, thoracic spine without tenderness to palpation. ?Other:   ? ?Medical Decision Making  ?Medically screening exam initiated at 12:20 PM.  Appropriate orders placed.  Destiny Ballard was informed that the remainder of the evaluation will be completed by another provider, this initial triage assessment does not replace that evaluation, and the importance of remaining in the ED until their evaluation is complete. ? ? ?  ?Evlyn Courier, PA-C ?11/21/21 1222 ? ?  ?Evlyn Courier, PA-C ?11/21/21 1224 ? ?

## 2021-11-21 NOTE — Discharge Instructions (Addendum)
Please keep arm elevated and use cold therapy. ?Call Dr. Luanna Cole office for recheck in 1 to 2 days ?

## 2021-11-21 NOTE — Progress Notes (Signed)
Orthopedic Tech Progress Note ?Patient Details:  ?Destiny Ballard ?May 19, 1951 ?[id number] ? ?Ortho Devices ?Type of Ortho Device: Arm sling, Sugartong splint ?Ortho Device/Splint Location: RUE ?Ortho Device/Splint Interventions: Ordered, Application, Adjustment ?  ?Post Interventions ?Patient Tolerated: Well ?Instructions Provided: Care of device ? ?Janit Pagan ?11/21/2021, 3:14 PM ? ?

## 2021-11-21 NOTE — ED Provider Notes (Signed)
?[facility] ?Provider Note ? ? ?CSN: [id number] ?Arrival date & time: 11/21/21  1114 ? ?  ? ?History ? ?Chief Complaint  ?Patient presents with  ? Fall  ? ? ?Destiny Ballard is a 71 y.o. female. ? ?HPI ?71 year old female presents today after fall.  She was playing in the yard with her grandson when attempted to kick a ball.  She had a mechanical fall.  She caught herself on her right hand.  She is having pain in the right wrist, low back, and right hip.  She was helped up and has been able to walk on her right hip since that time.  She does not think that she struck her head and did not have a loss of conscious.  She is not any blood thinners.  She denies any neck pain chest pain, or abdominal pain. ?  ? ?Home Medications ?Prior to Admission medications   ?Medication Sig Start Date End Date Taking? Authorizing Provider  ?oxyCODONE-acetaminophen (PERCOCET/ROXICET) 5-325 MG tablet Take 1 tablet by mouth every 6 (six) hours as needed for severe pain. 11/21/21  Yes Pattricia Boss, MD  ?albuterol (VENTOLIN HFA) 108 (90 Base) MCG/ACT inhaler INHALE 2 PUFFS INTO THE LUNGS EVERY EIGHT HOURS AS NEEDED FOR WHEEZING OR SHORTNESS OF BREATH. 08/23/20 08/15/22  Jonetta Osgood, MD  ?aspirin EC 81 MG tablet Take 81 mg by mouth daily.    [provider]  ?calcium carbonate (TUMS EX) 750 MG chewable tablet Chew 1 tablet by mouth daily as needed for heartburn.    [provider]  ?Cholecalciferol (VITAMIN D3) 1000 UNITS CAPS Take 1,000 Units by mouth daily.    [provider]  ?dapagliflozin propanediol (FARXIGA) 10 MG TABS tablet Take 1 tablet (10 mg total) by mouth daily. 04/30/21   Shamleffer, Melanie Crazier, MD  ?Dulaglutide (TRULICITY) 3 WU/9.8JX SOPN Inject 3 mg into the skin once a week.    [provider]  ?fexofenadine (ALLEGRA) 180 MG tablet Take 1 tablet by mouth daily.    [provider]  ?FLUoxetine (PROZAC) 40 MG capsule TAKE 1 CAPSULE BY  MOUTH EVERY DAY 05/28/21   Copland, Gay Filler, MD  ?fluticasone (FLONASE) 50 MCG/ACT nasal spray Place 2 sprays into both nostrils as needed for allergies or rhinitis.    [provider]  ?furosemide (LASIX) 20 MG tablet TAKE 1 TABLET BY MOUTH EVERY DAY 07/28/19   Burtis Junes, NP  ?gabapentin (NEURONTIN) 300 MG capsule TAKE 2 CAPSULES BY MOUTH TWICE A DAY 10/01/21   Copland, Gay Filler, MD  ?hydrALAZINE (APRESOLINE) 25 MG tablet Take 2 tablets (50 mg total) by mouth 2 (two) times daily. For blood pressure 08/30/21   Copland, Gay Filler, MD  ?Insulin Glargine (BASAGLAR KWIKPEN) 100 UNIT/ML Inject 30 Units into the skin daily. Profile until patient requests ?Patient taking differently: Inject 30 Units into the skin daily as needed. Profile until patient requests 08/30/21   Copland, Gay Filler, MD  ?Insulin Pen Needle 32G X 4 MM MISC 1 Device by Does not apply route daily. 09/05/20   Shamleffer, Melanie Crazier, MD  ?loperamide (IMODIUM A-D) 2 MG tablet Take 1 tablet (2 mg total) by mouth 2 (two) times daily as needed for diarrhea or loose stools. 10/01/21   Ladene Artist, MD  ?LORazepam (ATIVAN) 0.5 MG tablet Take 1 tablet (0.5 mg total) by mouth 2 (two) times daily as needed for anxiety. 02/27/21   Ann Held, DO  ?magnesium  oxide (MAG-OX) 400 MG tablet Take 400 mg by mouth daily.    [provider]  ?metFORMIN (GLUCOPHAGE-XR) 500 MG 24 hr tablet TAKE 2 TABLETS BY MOUTH TWICE A DAY 08/16/21   Copland, Gay Filler, MD  ?metoprolol succinate (TOPROL-XL) 100 MG 24 hr tablet TAKE 1 TABLET BY MOUTH EVERY DAY 05/28/21   Copland, Gay Filler, MD  ?montelukast (SINGULAIR) 10 MG tablet Take 1 tablet (10 mg total) by mouth at bedtime. For allergies / asthma 08/30/21   Copland, Gay Filler, MD  ?NEOMYCIN-POLYMYXIN-HYDROCORTISONE (CORTISPORIN) 1 % SOLN OTIC solution Apply to nail beds from procedure site twice daily after soaks 10/16/21   McDonald, Stephan Minister, DPM  ?nitroGLYCERIN (NITROSTAT) 0.4 MG SL tablet Place 1  tablet (0.4 mg total) under the tongue every 5 (five) minutes as needed for chest pain. 11/27/20   Copland, Gay Filler, MD  ?Potassium 99 MG TABS Take 1 tablet by mouth daily.    [provider]  ?rosuvastatin (CRESTOR) 20 MG tablet Take 1 tablet (20 mg total) by mouth daily. For cholesterol / heart 08/30/21   Copland, Gay Filler, MD  ?Tiotropium Bromide Monohydrate (SPIRIVA RESPIMAT) 2.5 MCG/ACT AERS Inhale 2 puffs into the lungs daily. 08/20/21   Copland, Gay Filler, MD  ?traZODone (DESYREL) 50 MG tablet TAKE 1/2 TO 1 TABLET BY MOUTH AT BEDTIME AS NEEDED FOR SLEEP 01/18/20   Copland, Gay Filler, MD  ?valsartan (DIOVAN) 160 MG tablet TAKE 1 TABLET BY MOUTH EVERY DAY 11/12/21   Copland, Gay Filler, MD  ?vitamin E 180 MG (400 UNITS) capsule Take 400 Units by mouth daily.    [provider]  ?zinc gluconate 50 MG tablet Take 50 mg by mouth daily.    [provider]  ?   ? ?Allergies    ?Prednisone   ? ?Review of Systems   ?Review of Systems  ?All other systems reviewed and are negative. ? ?Physical Exam ?Updated Vital Signs ?BP (!) 156/66   Pulse 81   Temp 98 ?F (36.7 ?C) (Oral)   Resp 16   SpO2 94%  ?Physical Exam ?Vitals and nursing note reviewed.  ?Constitutional:   ?   General: She is not in acute distress. ?   Appearance: Normal appearance. She is not ill-appearing.  ?HENT:  ?   Head: Normocephalic.  ?   Right Ear: External ear normal.  ?   Left Ear: External ear normal.  ?   Nose: Nose normal.  ?   Mouth/Throat:  ?   Pharynx: Oropharynx is clear.  ?Eyes:  ?   Extraocular Movements: Extraocular movements intact.  ?   Pupils: Pupils are equal, round, and reactive to light.  ?Cardiovascular:  ?   Rate and Rhythm: Normal rate and regular rhythm.  ?   Pulses: Normal pulses.  ?   Heart sounds: Normal heart sounds.  ?Pulmonary:  ?   Effort: Pulmonary effort is normal.  ?Abdominal:  ?   Palpations: Abdomen is soft.  ?   Comments: No external signs of trauma on abdomen or chest  ?Musculoskeletal:  ?    Cervical back: Normal range of motion.  ?   Comments: Right wrist with swelling and deformity and tenderness palpation ?Radial pulses intact ?She is able to move the hand without difficulty ?Elbow and shoulder appear normal ?No obvious signs of external trauma to hip or pelvis ?She has full active range of motion of the right hip, right knee ?Pulses are intact in the right foot ?No signs  of trauma are noted in the left upper extremity or left lower extremity ?Patient has some diffuse tenderness of the lumbar spine  ?Skin: ?   General: Skin is warm and dry.  ?   Capillary Refill: Capillary refill takes less than 2 seconds.  ?Neurological:  ?   General: No focal deficit present.  ?   Mental Status: She is alert.  ?Psychiatric:     ?   Mood and Affect: Mood normal.  ? ? ?ED Results / Procedures / Treatments   ?Labs ?(all labs ordered are listed, but only abnormal results are displayed) ?Labs Reviewed - No data to display ? ?EKG ?None ? ?Radiology ?DG Wrist Complete Right ? ?Result Date: 11/21/2021 ?CLINICAL DATA:  [place]. Right wrist pain. EXAM: RIGHT WRIST - COMPLETE 3+ VIEW COMPARISON:  None Available. FINDINGS: Displaced and dorsally impacted intra-articular fracture of the distal radius (Colles type fracture) associated ulnar styloid avulsion fracture. The carpal bones and metacarpal bones are intact. There is widening of the scapholunate joint space which could suggest a ligamentous injury. IMPRESSION: 1. Displaced intra-articular distal radius fracture. Ulnar styloid avulsion fracture. 2. Widened scapholunate joint space could suggest a ligamentous injury. Electronically Signed   By: Marijo Sanes M.D.   On: 11/21/2021 12:58  ? ?CT Lumbar Spine Wo Contrast ? ?Result Date: 11/21/2021 ?CLINICAL DATA:  Fall with subsequent low back pain EXAM: CT LUMBAR SPINE WITHOUT CONTRAST TECHNIQUE: Multidetector CT imaging of the lumbar spine was performed without intravenous contrast administration. Multiplanar CT image  reconstructions were also generated. RADIATION DOSE REDUCTION: This exam was performed according to the departmental dose-optimization program which includes automated exposure control, adjustment of the mA and/or k

## 2021-11-23 ENCOUNTER — Other Ambulatory Visit: Payer: Self-pay

## 2021-11-23 ENCOUNTER — Encounter (HOSPITAL_COMMUNITY): Payer: Self-pay | Admitting: Orthopedic Surgery

## 2021-11-23 DIAGNOSIS — S52501A Unspecified fracture of the lower end of right radius, initial encounter for closed fracture: Secondary | ICD-10-CM | POA: Diagnosis not present

## 2021-11-23 NOTE — Progress Notes (Signed)
For Short Stay: ?[facility] appointment date: N/A ?Date of COVID positive in last 90 days: N/A ? ?Bowel Prep reminder:N/A ? ? ?For Anesthesia: ?PCP - Darreld Mclean, MD last office visit 11/12/21 in epic ?Cardiologist - Park Liter, MD last office visit 06/15/21 in epic ?Pulmonologist-Byrum, Rose Fillers, MD , last office visit note 11/13/17 by Magdalen Spatz, NP in epic ?Endocrinologist-Shamleffer, Melanie Crazier, MD last office visit 12/10/19 in epic ?Hematology and Oncology- [place], Holli Humbles, NP  last office visit 11/16/19 in epic ? ?Chest x-ray - 11/18/20 in epic ?EKG - 06/15/21 in epic ?Stress Test - 01/25/21 in epic ?ECHO - 01/25/21 in epic ?Cardiac Cath - 10/10/17 in epic ?Pacemaker/ICD device last checked: N/A ?Pacemaker orders received: N/A ?Device Rep notified: N/A ? ?Spinal Cord Stimulator: N/A ? ?Sleep Study - Yes  ?CPAP - NO ? ?Fasting Blood Sugar - 120's ?Checks Blood Sugar ___3__ times a week ?Date and result of last Hgb A1c- 08/22/21 (7.3) in epic ? ?Blood Thinner Instructions: N/A ?Aspirin Instructions: Aspirin 81 mg  ?Last Dose: 11/23/21 ? ?Activity level:  activities of daily living without stopping and without chest pain and/or shortness of breath ?     ?Anesthesia review: CAD status post PTCA and stenting of the mid LAD in 7169, Chronic diastolic CHF (congestive heart failure), HTN, DM, OSA no CPAP, COPD ? ?Patient denies shortness of breath, fever, cough and chest pain at PAT appointment ? ? ?Patient verbalized understanding of instructions that were given to them at the PAT appointment. Patient was also instructed that they will need to review over the PAT instructions again at home before surgery.  ?

## 2021-11-23 NOTE — Progress Notes (Signed)
Anesthesia Chart Review ? ? Case: [id number] Date/Time: 11/27/21 1330  ? Procedure: OPEN REDUCTION INTERNAL FIXATION (ORIF) WRIST FRACTURE (Right: Wrist)  ? Anesthesia type: Choice  ? Pre-op diagnosis: RIGHT WRIST FRACTURE  ? Location: [facility] ROOM 07 / [facility]  ? Surgeons: Marchia Bond, MD  ? ?  ? ? ?DISCUSSION:71 y.o. never smoker with h/o HTN, CAD (s/p stent to LAD 2007), CHF, COPD, DM II, right wrist fracture scheduled for above procedure 12/03/21 with Dr. Marchia Bond.  ? ?Pt last seen by cardiology 06/15/2021. Per OV note, stable at this visit  ? ?Same day workup, evaluate DOS.  ?VS: Ht '4\' 11"'$  (1.499 m)   Wt 74.8 kg   BMI 33.33 kg/m?  ? ?PROVIDERS: ?Copland, Gay Filler, MD is PCP  ? ?Cardiologist:  Jenne Campus, MD   ?LABS:  labs DOS, same day workup ?(all labs ordered are listed, but only abnormal results are displayed) ? ?Labs Reviewed - No data to display ? ? ?IMAGES: ? ? ?EKG: ?06/15/21 ?Rate 84 bpm  ?NSR ?Septal infarct, age undetermined ? ?CV: ?Stress Test 01/25/2021 ?The left ventricular ejection fraction is hyperdynamic (>65%). ?Nuclear stress EF: 66%. ?The study is normal. ?This is a low risk study. ?  ?Normal resting and stress perfusion. No ischemia or infarction EF ?66% ? ?Echo 01/25/2021 ?1. Left ventricular ejection fraction, by estimation, is 60 to 65%. The  ?left ventricle has normal function. The left ventricle has no regional  ?wall motion abnormalities. Left ventricular diastolic parameters are  ?consistent with Grade I diastolic  ?dysfunction (impaired relaxation). Elevated left ventricular end-diastolic  ?pressure.  ? 2. Right ventricular systolic function is normal. The right ventricular  ?size is normal. Tricuspid regurgitation signal is inadequate for assessing  ?PA pressure.  ? 3. The mitral valve is normal in structure. Trivial mitral valve  ?regurgitation. No evidence of mitral stenosis.  ? 4. The aortic valve is normal in structure. Aortic valve regurgitation is  ?trivial. No aortic  stenosis is present. Aortic valve mean gradient  ?measures 8.4 mmHg. Aortic valve Vmax measures 1.98 m/s.  ? 5. The inferior vena cava is normal in size with greater than 50%  ?respiratory variability, suggesting right atrial pressure of 3 mmHg.  ?Past Medical History:  ?Diagnosis Date  ? Anxiety   ? Prior suicide attempt  ? CAD (coronary artery disease)   ? a) s/p DES to LAD 07/2005 b) Last Myoview low risk 11/2011 showing small fixed apical perfusion defect (prior MI vs attenuation) but no ischemia - normal EF.  ? Cervical spondylosis   ? Chest pain 12/10/2011  ? Chronic diastolic CHF (congestive heart failure) ([place]) 09/18/2017  ? Chronic eczematous otitis externa of both ears 11/18/2019  ? Chronic rhinitis 08/07/2016  ? Complication of anesthesia   ? developed pneumonia after nerve block was hospitalized  ? Constipation 12/18/2020  ? COPD with acute exacerbation ([place]) 04/25/2016  ? Coronary atherosclerosis 06/28/2008  ? Depression   ? Depression with anxiety   ? Diabetes mellitus ([place]) 09/08/2017  ? Diabetic neuropathy ([place])   ? Dyspnea on exertion 09/02/2012  ? CXR 07/2012:  No acute process.    ? Eustachian tube dysfunction, bilateral 11/18/2019  ? GERD 06/28/2008  ? Hearing loss   ? Left  ? History of colonic polyps 12/18/2020  ? History of kidney stones   ? HLD (hyperlipidemia)   ? Hyperlipidemia associated with type 2 diabetes mellitus ([place]) 08/21/2020  ? Hypertension   ? Hypertension associated with diabetes ([place])  01/30/2016  ? Hypokalemia 01/27/2016  ? Insulin resistance   ? Iron deficiency anemia   ? Leukocytosis   ? history of mild leukocytosis  ? Lobar pneumonia, unspecified organism ([place]) 11/13/2017  ? Melena 12/18/2020  ? 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  ? Obesity (BMI 30-39.9) 08/24/2018  ? Last Assessment & Plan:  Exercise and weight reduction is encouraged.  ? OSA (obstructive sleep apnea) 05/06/2014  ? Pneumonia due to COVID-19 virus 08/21/2020  ? Precordial  chest pain   ? Sciatic nerve disease, left   ? leg  ? Sepsis ([place]) 09/18/2017  ? 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 Tinnitus of both ears 11/18/2019  ? Type 2 diabetes mellitus with hyperglycemia, with long-term current use of insulin ([place]) 12/10/2019  ? Uncontrolled type 2 diabetes mellitus with complication, with long-term current use of insulin 03/14/2020  ? ? ?Past Surgical History:  ?Procedure Laterality Date  ? BREAST ENHANCEMENT SURGERY    ? CARDIAC CATHETERIZATION  06/17/2007  ? NORMAL. EF 60%  ? CARDIAC CATHETERIZATION N/A 01/29/2016  ? Procedure: Left Heart Cath and Coronary Angiography;  Surgeon: Sherren Mocha, MD;  Location: [place] [facility];  Service: Cardiovascular;  Laterality: N/A;  ? CERVICAL SPONDYLOSIS    ? SINGLE LEVEL FUSION  ? CHILDBIRTH    ? X3  ? COLONOSCOPY  2010  ? normal  ? COLONOSCOPY WITH PROPOFOL  10/01/2021  ? CORONARY STENT PLACEMENT  07/15/2005  ? LEFT ANTERIOR DESCENDING  ? FOREARM FRACTURE SURGERY  07/15/2008  ? hand and shoulder   ? INCISION AND DRAINAGE BREAST ABSCESS  01/05/2012  ?    ? INCISION AND DRAINAGE PERIRECTAL ABSCESS N/A 02/18/2014  ? Procedure: IRRIGATION AND DEBRIDEMENT PERIRECTAL ABSCESS;  Surgeon: Pedro Earls, MD;  Location[facility];  Service: General;  Laterality: N/A;  ? LEFT HEART CATH AND CORONARY ANGIOGRAPHY N/A 10/10/2017  ? Procedure: LEFT HEART CATH AND CORONARY ANGIOGRAPHY;  Surgeon: Burnell Blanks, MD;  Location: [place] [facility];  Service: Cardiovascular;  Laterality: N/A;  ? LUMBAR LAMINECTOMY    ? ROTATOR CUFF REPAIR    ? bilaterla  ? TONSILLECTOMY AND ADENOIDECTOMY    ? TUBAL LIGATION    ? VIDEO BRONCHOSCOPY Bilateral 08/13/2016  ? Procedure: VIDEO BRONCHOSCOPY WITHOUT FLUORO;  Surgeon: Collene Gobble, MD;  Location: [facility];  Service: Cardiopulmonary;  Laterality: Bilateral;  ? ? ?MEDICATIONS: ?No current facility-administered medications for this encounter.  ? ? albuterol (VENTOLIN HFA) 108 (90 Base) MCG/ACT  inhaler  ? aspirin EC 81 MG tablet  ? calcium carbonate (TUMS EX) 750 MG chewable tablet  ? Cholecalciferol (VITAMIN D3) 1000 UNITS CAPS  ? dapagliflozin propanediol (FARXIGA) 10 MG TABS tablet  ? DIPHENHYDRAMINE HCL PO  ? Dulaglutide (TRULICITY) 3 JJ/8.8CZ SOPN  ? fexofenadine (ALLEGRA) 180 MG tablet  ? FLUoxetine (PROZAC) 40 MG capsule  ? gabapentin (NEURONTIN) 300 MG capsule  ? hydrALAZINE (APRESOLINE) 25 MG tablet  ? Insulin Glargine (BASAGLAR KWIKPEN) 100 UNIT/ML  ? loperamide (IMODIUM A-D) 2 MG tablet  ? LORazepam (ATIVAN) 0.5 MG tablet  ? metFORMIN (GLUCOPHAGE-XR) 500 MG 24 hr tablet  ? metoprolol succinate (TOPROL-XL) 100 MG 24 hr tablet  ? montelukast (SINGULAIR) 10 MG tablet  ? nitroGLYCERIN (NITROSTAT) 0.4 MG SL tablet  ? ondansetron (ZOFRAN) 4 MG tablet  ? oxyCODONE-acetaminophen (PERCOCET/ROXICET) 5-325 MG tablet  ? rosuvastatin (CRESTOR) 20 MG tablet  ? valsartan (DIOVAN) 160 MG tablet  ? zinc gluconate 50 MG tablet  ?  furosemide (LASIX) 20 MG tablet  ? Insulin Pen Needle 32G X 4 MM MISC  ? NEOMYCIN-POLYMYXIN-HYDROCORTISONE (CORTISPORIN) 1 % SOLN OTIC solution  ? oxyCODONE (OXY IR/ROXICODONE) 5 MG immediate release tablet  ? Tiotropium Bromide Monohydrate (SPIRIVA RESPIMAT) 2.5 MCG/ACT AERS  ? traZODone (DESYREL) 50 MG tablet  ? ? ?Konrad Felix Ward, PA-C ?[facility] ?[phone] ? ? ? ? ? ? ?

## 2021-11-26 NOTE — H&P (Signed)
PREOPERATIVE H&P ? ?Chief Complaint: right wrist pain. ? ?HPI: ?Destiny Ballard is a 71 y.o. female who presents for preoperative history and physical with a diagnosis of right distal radius fracture. She fell on 11/21/2021 while playing with her grandson kicking a ball. She was seen at [facility] emergency department where x-rays were taken and she was placed in a sugar-tong splint.  She states pain has been severe she has been using oxycodone for pain.  She does have a history of left rotator cuff repair distal radius ORIF in the past. Her pain is significantly impairing activities of daily living.  She has elected for surgical management.  ? ?Past Medical History:  ?Diagnosis Date  ? Anxiety   ? Prior suicide attempt  ? CAD (coronary artery disease)   ? a) s/p DES to LAD 07/2005 b) Last Myoview low risk 11/2011 showing small fixed apical perfusion defect (prior MI vs attenuation) but no ischemia - normal EF.  ? Cervical spondylosis   ? Chest pain 12/10/2011  ? Chronic diastolic CHF (congestive heart failure) ([place]) 09/18/2017  ? Chronic eczematous otitis externa of both ears 11/18/2019  ? Chronic rhinitis 08/07/2016  ? Complication of anesthesia   ? developed pneumonia after nerve block was hospitalized  ? Constipation 12/18/2020  ? COPD with acute exacerbation ([place]) 04/25/2016  ? Coronary atherosclerosis 06/28/2008  ? Depression   ? Depression with anxiety   ? Diabetes mellitus ([place]) 09/08/2017  ? Diabetic neuropathy ([place])   ? Dyspnea on exertion 09/02/2012  ? CXR 07/2012:  No acute process.    ? Eustachian tube dysfunction, bilateral 11/18/2019  ? GERD 06/28/2008  ? Hearing loss   ? Left  ? History of colonic polyps 12/18/2020  ? History of kidney stones   ? HLD (hyperlipidemia)   ? Hyperlipidemia associated with type 2 diabetes mellitus ([place]) 08/21/2020  ? Hypertension   ? Hypertension associated with diabetes ([place]) 01/30/2016  ? Hypokalemia 01/27/2016  ? Insulin resistance   ? Iron deficiency anemia   ? Leukocytosis    ? history of mild leukocytosis  ? Lobar pneumonia, unspecified organism ([place]) 11/13/2017  ? Melena 12/18/2020  ? 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  ? Obesity (BMI 30-39.9) 08/24/2018  ? Last Assessment & Plan:  Exercise and weight reduction is encouraged.  ? OSA (obstructive sleep apnea) 05/06/2014  ? Pneumonia due to COVID-19 virus 08/21/2020  ? Precordial chest pain   ? Sciatic nerve disease, left   ? leg  ? Sepsis ([place]) 09/18/2017  ? 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 Tinnitus of both ears 11/18/2019  ? Type 2 diabetes mellitus with hyperglycemia, with long-term current use of insulin ([place]) 12/10/2019  ? Uncontrolled type 2 diabetes mellitus with complication, with long-term current use of insulin 03/14/2020  ? ?Past Surgical History:  ?Procedure Laterality Date  ? BREAST ENHANCEMENT SURGERY    ? CARDIAC CATHETERIZATION  06/17/2007  ? NORMAL. EF 60%  ? CARDIAC CATHETERIZATION N/A 01/29/2016  ? Procedure: Left Heart Cath and Coronary Angiography;  Surgeon: Sherren Mocha, MD;  Location: [place] [facility];  Service: Cardiovascular;  Laterality: N/A;  ? CERVICAL SPONDYLOSIS    ? SINGLE LEVEL FUSION  ? CHILDBIRTH    ? X3  ? COLONOSCOPY  2010  ? normal  ? COLONOSCOPY WITH PROPOFOL  10/01/2021  ? CORONARY STENT PLACEMENT  07/15/2005  ? LEFT ANTERIOR DESCENDING  ? FOREARM FRACTURE SURGERY  07/15/2008  ? hand and shoulder   ?  INCISION AND DRAINAGE BREAST ABSCESS  01/05/2012  ?    ? INCISION AND DRAINAGE PERIRECTAL ABSCESS N/A 02/18/2014  ? Procedure: IRRIGATION AND DEBRIDEMENT PERIRECTAL ABSCESS;  Surgeon: Pedro Earls, MD;  [facility];  Service: General;  Laterality: N/A;  ? LEFT HEART CATH AND CORONARY ANGIOGRAPHY N/A 10/10/2017  ? Procedure: LEFT HEART CATH AND CORONARY ANGIOGRAPHY;  Surgeon: Burnell Blanks, MD;  Location: [place] [facility];  Service: Cardiovascular;  Laterality: N/A;  ? LUMBAR LAMINECTOMY    ? ROTATOR CUFF REPAIR    ? bilaterla   ? TONSILLECTOMY AND ADENOIDECTOMY    ? TUBAL LIGATION    ? VIDEO BRONCHOSCOPY Bilateral 08/13/2016  ? Procedure: VIDEO BRONCHOSCOPY WITHOUT FLUORO;  Surgeon: Collene Gobble, MD;  Location: [facility];  Service: Cardiopulmonary;  Laterality: Bilateral;  ? ?Social History  ? ?Socioeconomic History  ? Marital status: Widowed  ?  Spouse name: Lake Bells  ? Number of children: 3  ? Years of education: 76  ? Highest education level: Not on file  ?Occupational History  ? Occupation: retired from [employer]  ?  Comment: 11/2012  ?Tobacco Use  ? Smoking status: Never  ?  Passive exposure: Yes  ? Smokeless tobacco: Never  ?Vaping Use  ? Vaping Use: Never used  ?Substance and Sexual Activity  ? Alcohol use: Yes  ?  Comment: occ  ? Drug use: No  ? Sexual activity: Not Currently  ?  Partners: Male  ?  Birth control/protection: None  ?Other Topics Concern  ? Not on file  ?Social History Narrative  ? Their eldest daughter lives upstairs.  ? ?Social Determinants of Health  ? ?Financial Resource Strain: Medium Risk  ? Difficulty of Paying Living Expenses: Somewhat hard  ?Food Insecurity: Not on file  ?Transportation Needs: Not on file  ?Physical Activity: Inactive  ? Days of Exercise per Week: 0 days  ? Minutes of Exercise per Session: 0 min  ?Stress: No Stress Concern Present  ? Feeling of Stress : Only a little  ?Social Connections: Socially Isolated  ? Frequency of Communication with Friends and Family: More than three times a week  ? Frequency of Social Gatherings with Friends and Family: More than three times a week  ? Attends Religious Services: Never  ? Active Member of Clubs or Organizations: No  ? Attends Archivist Meetings: Never  ? Marital Status: Widowed  ? ?Family History  ?Problem Relation Age of Onset  ? Heart attack Mother   ? Diabetes Mother   ? Lung cancer Mother   ? Asthma Mother   ? Heart disease Mother   ? Suicidality Father   ?     "killed himself"  ? Diabetes Sister   ? Cancer Sister   ? Colon cancer Sister  2  ? Asthma Daughter   ?     x 2  ? Cancer Daughter   ?     pre-cancerous polyp  ? Cervical cancer Daughter   ?     cervical   ? Allergies Other   ?     all family--seasonal allergies  ? ?Allergies  ?Allergen Reactions  ? Prednisone Other (See Comments)  ?  REACTION: mood swings, nightmares. "Shot doesn't bother me, reaction is just with the pill" she states she has had the steroid injections before. From our records methylprednisone was given to her in 2013 without any complications.  ? ?Prior to Admission medications   ?Medication Sig Start Date End Date  Taking? Authorizing Provider  ?albuterol (VENTOLIN HFA) 108 (90 Base) MCG/ACT inhaler INHALE 2 PUFFS INTO THE LUNGS EVERY EIGHT HOURS AS NEEDED FOR WHEEZING OR SHORTNESS OF BREATH. 08/23/20 08/15/22 Yes Ghimire, Henreitta Leber, MD  ?aspirin EC 81 MG tablet Take 81 mg by mouth daily.   Yes [provider]  ?calcium carbonate (TUMS EX) 750 MG chewable tablet Chew 1 tablet by mouth daily as needed for heartburn.   Yes [provider]  ?Cholecalciferol (VITAMIN D3) 1000 UNITS CAPS Take 1,000 Units by mouth daily.   Yes [provider]  ?dapagliflozin propanediol (FARXIGA) 10 MG TABS tablet Take 1 tablet (10 mg total) by mouth daily. 04/30/21  Yes Shamleffer, Melanie Crazier, MD  ?DIPHENHYDRAMINE HCL PO Take 25 mg by mouth every 6 (six) hours as needed for sleep or allergies.   Yes [provider]  ?Dulaglutide (TRULICITY) 3 XY/3.3XO SOPN Inject 3 mg into the skin once a week.   Yes [provider]  ?fexofenadine (ALLEGRA) 180 MG tablet Take 180 mg by mouth daily.   Yes [provider]  ?FLUoxetine (PROZAC) 40 MG capsule TAKE 1 CAPSULE BY MOUTH EVERY DAY 05/28/21  Yes Copland, Gay Filler, MD  ?gabapentin (NEURONTIN) 300 MG capsule TAKE 2 CAPSULES BY MOUTH TWICE A DAY 10/01/21  Yes Copland, Gay Filler, MD  ?hydrALAZINE (APRESOLINE) 25 MG tablet Take 2 tablets (50 mg total) by mouth 2 (two) times daily. For blood pressure  08/30/21  Yes Copland, Gay Filler, MD  ?Insulin Glargine (BASAGLAR KWIKPEN) 100 UNIT/ML Inject 30 Units into the skin daily. Profile until patient requests 08/30/21  Yes Copland, Gay Filler, MD  ?loperamide (IM

## 2021-11-27 ENCOUNTER — Ambulatory Visit (HOSPITAL_COMMUNITY): Payer: Medicare Other | Admitting: Physician Assistant

## 2021-11-27 ENCOUNTER — Encounter (HOSPITAL_COMMUNITY): Payer: Self-pay | Admitting: Orthopedic Surgery

## 2021-11-27 ENCOUNTER — Ambulatory Visit (INDEPENDENT_AMBULATORY_CARE_PROVIDER_SITE_OTHER): Payer: Medicare Other | Admitting: Pharmacist

## 2021-11-27 ENCOUNTER — Other Ambulatory Visit: Payer: Self-pay

## 2021-11-27 ENCOUNTER — Observation Stay (HOSPITAL_COMMUNITY)
Admission: RE | Admit: 2021-11-27 | Discharge: 2021-11-29 | Disposition: A | Discharge status: Home / Self Care | Payer: Medicare Other | Attending: Orthopedic Surgery | Admitting: Orthopedic Surgery

## 2021-11-27 ENCOUNTER — Ambulatory Visit (HOSPITAL_BASED_OUTPATIENT_CLINIC_OR_DEPARTMENT_OTHER): Payer: Medicare Other | Admitting: Physician Assistant

## 2021-11-27 ENCOUNTER — Encounter (HOSPITAL_COMMUNITY)
Admission: RE | Disposition: A | Discharge status: Home / Self Care | Payer: Self-pay | Source: Home / Self Care | Attending: Orthopedic Surgery

## 2021-11-27 DIAGNOSIS — E785 Hyperlipidemia, unspecified: Secondary | ICD-10-CM

## 2021-11-27 DIAGNOSIS — Z794 Long term (current) use of insulin: Secondary | ICD-10-CM | POA: Insufficient documentation

## 2021-11-27 DIAGNOSIS — Z7982 Long term (current) use of aspirin: Secondary | ICD-10-CM | POA: Diagnosis not present

## 2021-11-27 DIAGNOSIS — J449 Chronic obstructive pulmonary disease, unspecified: Secondary | ICD-10-CM | POA: Insufficient documentation

## 2021-11-27 DIAGNOSIS — I110 Hypertensive heart disease with heart failure: Secondary | ICD-10-CM | POA: Insufficient documentation

## 2021-11-27 DIAGNOSIS — S52571A Other intraarticular fracture of lower end of right radius, initial encounter for closed fracture: Secondary | ICD-10-CM | POA: Diagnosis not present

## 2021-11-27 DIAGNOSIS — I5032 Chronic diastolic (congestive) heart failure: Secondary | ICD-10-CM

## 2021-11-27 DIAGNOSIS — W1830XA Fall on same level, unspecified, initial encounter: Secondary | ICD-10-CM | POA: Diagnosis not present

## 2021-11-27 DIAGNOSIS — E1140 Type 2 diabetes mellitus with diabetic neuropathy, unspecified: Secondary | ICD-10-CM | POA: Diagnosis not present

## 2021-11-27 DIAGNOSIS — I2510 Atherosclerotic heart disease of native coronary artery without angina pectoris: Secondary | ICD-10-CM

## 2021-11-27 DIAGNOSIS — Z8616 Personal history of COVID-19: Secondary | ICD-10-CM | POA: Diagnosis not present

## 2021-11-27 DIAGNOSIS — Z7984 Long term (current) use of oral hypoglycemic drugs: Secondary | ICD-10-CM | POA: Diagnosis not present

## 2021-11-27 DIAGNOSIS — Z955 Presence of coronary angioplasty implant and graft: Secondary | ICD-10-CM | POA: Insufficient documentation

## 2021-11-27 DIAGNOSIS — I25118 Atherosclerotic heart disease of native coronary artery with other forms of angina pectoris: Secondary | ICD-10-CM

## 2021-11-27 DIAGNOSIS — G8918 Other acute postprocedural pain: Secondary | ICD-10-CM | POA: Diagnosis not present

## 2021-11-27 DIAGNOSIS — Y9359 Activity, other involving other sports and athletics played individually: Secondary | ICD-10-CM | POA: Insufficient documentation

## 2021-11-27 DIAGNOSIS — D509 Iron deficiency anemia, unspecified: Secondary | ICD-10-CM

## 2021-11-27 DIAGNOSIS — E1142 Type 2 diabetes mellitus with diabetic polyneuropathy: Secondary | ICD-10-CM

## 2021-11-27 DIAGNOSIS — S52591A Other fractures of lower end of right radius, initial encounter for closed fracture: Secondary | ICD-10-CM | POA: Diagnosis not present

## 2021-11-27 DIAGNOSIS — I152 Hypertension secondary to endocrine disorders: Secondary | ICD-10-CM

## 2021-11-27 DIAGNOSIS — Z79899 Other long term (current) drug therapy: Secondary | ICD-10-CM | POA: Diagnosis not present

## 2021-11-27 DIAGNOSIS — S62101A Fracture of unspecified carpal bone, right wrist, initial encounter for closed fracture: Secondary | ICD-10-CM | POA: Diagnosis not present

## 2021-11-27 DIAGNOSIS — E119 Type 2 diabetes mellitus without complications: Secondary | ICD-10-CM

## 2021-11-27 DIAGNOSIS — I509 Heart failure, unspecified: Secondary | ICD-10-CM | POA: Diagnosis not present

## 2021-11-27 DIAGNOSIS — R0902 Hypoxemia: Secondary | ICD-10-CM | POA: Diagnosis not present

## 2021-11-27 DIAGNOSIS — Z7985 Long-term (current) use of injectable non-insulin antidiabetic drugs: Secondary | ICD-10-CM | POA: Diagnosis not present

## 2021-11-27 HISTORY — DX: Elevated white blood cell count, unspecified: D72.829

## 2021-11-27 HISTORY — DX: Type 2 diabetes mellitus with diabetic neuropathy, unspecified: E11.40

## 2021-11-27 HISTORY — PX: ORIF WRIST FRACTURE: SHX2133

## 2021-11-27 HISTORY — DX: Unspecified hearing loss, unspecified ear: H91.90

## 2021-11-27 HISTORY — DX: Personal history of urinary calculi: Z87.442

## 2021-11-27 HISTORY — DX: Other complications of anesthesia, initial encounter: T88.59XA

## 2021-11-27 HISTORY — DX: Lesion of sciatic nerve, left lower limb: G57.02

## 2021-11-27 LAB — BASIC METABOLIC PANEL
Anion gap: 9 (ref 5–15)
BUN: 10 mg/dL (ref 8–23)
CO2: 29 mmol/L (ref 22–32)
Calcium: 9.1 mg/dL (ref 8.9–10.3)
Chloride: 98 mmol/L (ref 98–111)
Creatinine, Ser: 0.56 mg/dL (ref 0.44–1.00)
GFR, Estimated: >60 mL/min (ref >60)
Glucose, Bld: 125 mg/dL — ABNORMAL HIGH (ref 70–99)
Potassium: 3.3 mmol/L — ABNORMAL LOW (ref 3.5–5.1)
Sodium: 136 mmol/L (ref 135–145)

## 2021-11-27 LAB — CBC
HCT: 37.9 % (ref 36.0–46.0)
Hemoglobin: 11.6 g/dL — ABNORMAL LOW (ref 12.0–15.0)
MCH: 25.3 pg — ABNORMAL LOW (ref 26.0–34.0)
MCHC: 30.6 g/dL (ref 30.0–36.0)
MCV: 82.8 fL (ref 80.0–100.0)
Platelets: 242 10*3/uL (ref 150–400)
RBC: 4.58 MIL/uL (ref 3.87–5.11)
RDW: 14.1 % (ref 11.5–15.5)
WBC: 9.1 10*3/uL (ref 4.0–10.5)
nRBC: 0 % (ref 0.0–0.2)

## 2021-11-27 LAB — GLUCOSE, CAPILLARY
Glucose-Capillary: 113 mg/dL — ABNORMAL HIGH (ref 70–99)
Glucose-Capillary: 156 mg/dL — ABNORMAL HIGH (ref 70–99)

## 2021-11-27 LAB — HEMOGLOBIN A1C
Hgb A1c MFr Bld: 7 % — ABNORMAL HIGH (ref 4.8–5.6)
Mean Plasma Glucose: 154.2 mg/dL

## 2021-11-27 SURGERY — OPEN REDUCTION INTERNAL FIXATION (ORIF) WRIST FRACTURE
Anesthesia: General | Site: Wrist | Laterality: Right

## 2021-11-27 MED ORDER — ACETAMINOPHEN 10 MG/ML IV SOLN: 1000.0000 mg | Freq: Once | INTRAVENOUS | Status: DC | PRN

## 2021-11-27 MED ORDER — TIOTROPIUM BROMIDE MONOHYDRATE 2.5 MCG/ACT IN AERS: 2.0000 | INHALATION_SPRAY | Freq: Every day | RESPIRATORY_TRACT | Status: DC | PRN

## 2021-11-27 MED ORDER — NITROGLYCERIN 0.4 MG SL SUBL: 0.4000 mg | SUBLINGUAL_TABLET | SUBLINGUAL | Status: DC | PRN

## 2021-11-27 MED ORDER — OXYCODONE HCL 5 MG PO TABS
10.0000 mg | ORAL_TABLET | ORAL | Status: DC | PRN
  Administered 2021-11-28 – 2021-11-29 (×7): 10 mg via ORAL
  Filled 2021-11-27 (×8): qty 2

## 2021-11-27 MED ORDER — UMECLIDINIUM BROMIDE 62.5 MCG/ACT IN AEPB
1.0000 | INHALATION_SPRAY | Freq: Every day | RESPIRATORY_TRACT | Status: DC
  Administered 2021-11-28 – 2021-11-29 (×2): 1 via RESPIRATORY_TRACT
  Filled 2021-11-27: qty 7

## 2021-11-27 MED ORDER — BUPIVACAINE-EPINEPHRINE (PF) 0.5% -1:200000 IJ SOLN
INTRAMUSCULAR | Status: DC | PRN
  Administered 2021-11-27: 20 mL via PERINEURAL

## 2021-11-27 MED ORDER — BISACODYL 5 MG PO TBEC: 5.0000 mg | DELAYED_RELEASE_TABLET | Freq: Every day | ORAL | Status: DC | PRN

## 2021-11-27 MED ORDER — ACETAMINOPHEN 325 MG PO TABS: 325.0000 mg | ORAL_TABLET | Freq: Four times a day (QID) | ORAL | Status: DC | PRN

## 2021-11-27 MED ORDER — AMISULPRIDE (ANTIEMETIC) 5 MG/2ML IV SOLN: 10.0000 mg | Freq: Once | INTRAVENOUS | Status: DC | PRN

## 2021-11-27 MED ORDER — SENNOSIDES-DOCUSATE SODIUM 8.6-50 MG PO TABS: 1.0000 | ORAL_TABLET | Freq: Every evening | ORAL | Status: DC | PRN

## 2021-11-27 MED ORDER — LACTATED RINGERS IV SOLN: INTRAVENOUS | Status: DC

## 2021-11-27 MED ORDER — ONDANSETRON HCL 4 MG PO TABS: 4.0000 mg | ORAL_TABLET | Freq: Three times a day (TID) | ORAL | 0 refills | Status: DC | PRN

## 2021-11-27 MED ORDER — ACETAMINOPHEN 500 MG PO TABS
1000.0000 mg | ORAL_TABLET | Freq: Once | ORAL | Status: AC
  Administered 2021-11-27: 1000 mg via ORAL
  Filled 2021-11-27: qty 2

## 2021-11-27 MED ORDER — ONDANSETRON HCL 4 MG/2ML IJ SOLN
INTRAMUSCULAR | Status: DC | PRN
  Administered 2021-11-27: 4 mg via INTRAVENOUS

## 2021-11-27 MED ORDER — FLEET ENEMA 7-19 GM/118ML RE ENEM
1.0000 | ENEMA | Freq: Once | RECTAL | Status: DC | PRN
Start: 2021-11-27 — End: 2021-11-29

## 2021-11-27 MED ORDER — 0.9 % SODIUM CHLORIDE (POUR BTL) OPTIME
TOPICAL | Status: DC | PRN
Start: 2021-11-27 — End: 2021-11-27
  Administered 2021-11-27: 1000 mL

## 2021-11-27 MED ORDER — MIDAZOLAM HCL 2 MG/2ML IJ SOLN
1.0000 mg | INTRAMUSCULAR | Status: DC
  Filled 2021-11-27: qty 2

## 2021-11-27 MED ORDER — ALBUTEROL SULFATE (2.5 MG/3ML) 0.083% IN NEBU: 3.0000 mL | INHALATION_SOLUTION | Freq: Three times a day (TID) | RESPIRATORY_TRACT | Status: DC | PRN

## 2021-11-27 MED ORDER — ORAL CARE MOUTH RINSE: 15.0000 mL | Freq: Once | OROMUCOSAL | Status: AC

## 2021-11-27 MED ORDER — ONDANSETRON HCL 4 MG/2ML IJ SOLN
INTRAMUSCULAR | Status: AC
  Filled 2021-11-27: qty 2

## 2021-11-27 MED ORDER — HYDRALAZINE HCL 50 MG PO TABS
50.0000 mg | ORAL_TABLET | Freq: Two times a day (BID) | ORAL | Status: DC
  Administered 2021-11-27 – 2021-11-28 (×3): 50 mg via ORAL
  Filled 2021-11-27 (×4): qty 1

## 2021-11-27 MED ORDER — ONDANSETRON HCL 4 MG/2ML IJ SOLN: 4.0000 mg | Freq: Four times a day (QID) | INTRAMUSCULAR | Status: DC | PRN

## 2021-11-27 MED ORDER — FENTANYL CITRATE PF 50 MCG/ML IJ SOSY
50.0000 ug | PREFILLED_SYRINGE | INTRAMUSCULAR | Status: DC
  Administered 2021-11-27: 25 ug via INTRAVENOUS
  Filled 2021-11-27: qty 2

## 2021-11-27 MED ORDER — METFORMIN HCL ER 500 MG PO TB24
1000.0000 mg | ORAL_TABLET | Freq: Two times a day (BID) | ORAL | Status: DC
  Administered 2021-11-28 – 2021-11-29 (×3): 1000 mg via ORAL
  Filled 2021-11-27 (×3): qty 2

## 2021-11-27 MED ORDER — OXYCODONE HCL 5 MG PO TABS
5.0000 mg | ORAL_TABLET | ORAL | Status: DC | PRN
  Administered 2021-11-27: 5 mg via ORAL
  Filled 2021-11-27: qty 1

## 2021-11-27 MED ORDER — CEFAZOLIN SODIUM-DEXTROSE 2-4 GM/100ML-% IV SOLN
2.0000 g | INTRAVENOUS | Status: AC
  Administered 2021-11-27: 2 g via INTRAVENOUS
  Filled 2021-11-27: qty 100

## 2021-11-27 MED ORDER — SODIUM CHLORIDE 0.9 % IV SOLN: INTRAVENOUS | Status: DC

## 2021-11-27 MED ORDER — METOPROLOL SUCCINATE ER 50 MG PO TB24
100.0000 mg | ORAL_TABLET | Freq: Every day | ORAL | Status: DC
  Administered 2021-11-27 – 2021-11-28 (×2): 100 mg via ORAL
  Filled 2021-11-27 (×3): qty 2

## 2021-11-27 MED ORDER — ROSUVASTATIN CALCIUM 20 MG PO TABS
20.0000 mg | ORAL_TABLET | Freq: Every day | ORAL | Status: DC
  Administered 2021-11-27 – 2021-11-29 (×3): 20 mg via ORAL
  Filled 2021-11-27 (×3): qty 1

## 2021-11-27 MED ORDER — IRBESARTAN 150 MG PO TABS
150.0000 mg | ORAL_TABLET | Freq: Every day | ORAL | Status: DC
  Administered 2021-11-27 – 2021-11-28 (×2): 150 mg via ORAL
  Filled 2021-11-27 (×3): qty 1

## 2021-11-27 MED ORDER — LIDOCAINE 2% (20 MG/ML) 5 ML SYRINGE
INTRAMUSCULAR | Status: DC | PRN
  Administered 2021-11-27: 40 mg via INTRAVENOUS

## 2021-11-27 MED ORDER — POVIDONE-IODINE 10 % EX SWAB
2.0000 "application " | Freq: Once | CUTANEOUS | Status: AC
  Administered 2021-11-27: 2 via TOPICAL

## 2021-11-27 MED ORDER — OXYCODONE HCL 5 MG PO TABS
5.0000 mg | ORAL_TABLET | Freq: Once | ORAL | Status: AC | PRN
  Administered 2021-11-27: 5 mg via ORAL

## 2021-11-27 MED ORDER — ALBUTEROL SULFATE (2.5 MG/3ML) 0.083% IN NEBU
2.5000 mg | INHALATION_SOLUTION | Freq: Four times a day (QID) | RESPIRATORY_TRACT | Status: DC | PRN
  Administered 2021-11-27: 2.5 mg via RESPIRATORY_TRACT

## 2021-11-27 MED ORDER — ACETAMINOPHEN 500 MG PO TABS
1000.0000 mg | ORAL_TABLET | Freq: Four times a day (QID) | ORAL | Status: DC
  Administered 2021-11-28 – 2021-11-29 (×3): 1000 mg via ORAL
  Filled 2021-11-27 (×3): qty 2

## 2021-11-27 MED ORDER — MONTELUKAST SODIUM 10 MG PO TABS
10.0000 mg | ORAL_TABLET | Freq: Every day | ORAL | Status: DC
  Administered 2021-11-27 – 2021-11-28 (×2): 10 mg via ORAL
  Filled 2021-11-27 (×2): qty 1

## 2021-11-27 MED ORDER — HYDROMORPHONE HCL 1 MG/ML IJ SOLN: 0.5000 mg | INTRAMUSCULAR | Status: DC | PRN

## 2021-11-27 MED ORDER — FLUOXETINE HCL 20 MG PO CAPS
40.0000 mg | ORAL_CAPSULE | Freq: Every day | ORAL | Status: DC
  Administered 2021-11-27 – 2021-11-29 (×3): 40 mg via ORAL
  Filled 2021-11-27 (×4): qty 2

## 2021-11-27 MED ORDER — PROPOFOL 10 MG/ML IV BOLUS
INTRAVENOUS | Status: DC | PRN
  Administered 2021-11-27: 110 mg via INTRAVENOUS

## 2021-11-27 MED ORDER — DEXAMETHASONE SODIUM PHOSPHATE 10 MG/ML IJ SOLN
INTRAMUSCULAR | Status: DC | PRN
  Administered 2021-11-27: 8 mg via INTRAVENOUS

## 2021-11-27 MED ORDER — BASAGLAR KWIKPEN 100 UNIT/ML ~~LOC~~ SOPN: 30.0000 [IU] | PEN_INJECTOR | Freq: Every day | SUBCUTANEOUS | Status: DC

## 2021-11-27 MED ORDER — FENTANYL CITRATE PF 50 MCG/ML IJ SOSY: 25.0000 ug | PREFILLED_SYRINGE | INTRAMUSCULAR | Status: DC | PRN

## 2021-11-27 MED ORDER — LORAZEPAM 0.5 MG PO TABS: 0.5000 mg | ORAL_TABLET | Freq: Two times a day (BID) | ORAL | Status: DC | PRN

## 2021-11-27 MED ORDER — ACETAMINOPHEN 160 MG/5ML PO SOLN: 325.0000 mg | ORAL | Status: DC | PRN

## 2021-11-27 MED ORDER — PROPOFOL 10 MG/ML IV BOLUS
INTRAVENOUS | Status: AC
  Filled 2021-11-27: qty 20

## 2021-11-27 MED ORDER — DAPAGLIFLOZIN PROPANEDIOL 10 MG PO TABS
10.0000 mg | ORAL_TABLET | Freq: Every day | ORAL | Status: DC
  Administered 2021-11-28 – 2021-11-29 (×2): 10 mg via ORAL
  Filled 2021-11-27 (×2): qty 1

## 2021-11-27 MED ORDER — OXYCODONE HCL 5 MG/5ML PO SOLN: 5.0000 mg | Freq: Once | ORAL | Status: AC | PRN

## 2021-11-27 MED ORDER — CHLORHEXIDINE GLUCONATE 0.12 % MT SOLN
15.0000 mL | Freq: Once | OROMUCOSAL | Status: AC
  Administered 2021-11-27: 15 mL via OROMUCOSAL

## 2021-11-27 MED ORDER — ONDANSETRON HCL 4 MG PO TABS: 4.0000 mg | ORAL_TABLET | Freq: Four times a day (QID) | ORAL | Status: DC | PRN

## 2021-11-27 MED ORDER — OXYCODONE HCL 5 MG PO TABS
ORAL_TABLET | ORAL | Status: AC
  Filled 2021-11-27: qty 1

## 2021-11-27 MED ORDER — LORATADINE 10 MG PO TABS
10.0000 mg | ORAL_TABLET | Freq: Every day | ORAL | Status: DC
Start: 2021-11-28 — End: 2021-11-29
  Administered 2021-11-28 – 2021-11-29 (×2): 10 mg via ORAL
  Filled 2021-11-27 (×2): qty 1

## 2021-11-27 MED ORDER — ALBUTEROL SULFATE (2.5 MG/3ML) 0.083% IN NEBU
INHALATION_SOLUTION | RESPIRATORY_TRACT | Status: AC
Start: 2021-11-27 — End: 2021-11-27
  Filled 2021-11-27: qty 3

## 2021-11-27 MED ORDER — FENTANYL CITRATE (PF) 100 MCG/2ML IJ SOLN
INTRAMUSCULAR | Status: AC
  Filled 2021-11-27: qty 2

## 2021-11-27 MED ORDER — ACETAMINOPHEN 325 MG PO TABS: 325.0000 mg | ORAL_TABLET | ORAL | Status: DC | PRN

## 2021-11-27 MED ORDER — FENTANYL CITRATE (PF) 100 MCG/2ML IJ SOLN
INTRAMUSCULAR | Status: DC | PRN
  Administered 2021-11-27: 25 ug via INTRAVENOUS

## 2021-11-27 MED ORDER — DEXAMETHASONE SODIUM PHOSPHATE 10 MG/ML IJ SOLN
INTRAMUSCULAR | Status: AC
  Filled 2021-11-27: qty 1

## 2021-11-27 MED ORDER — OXYCODONE HCL 5 MG PO TABS
5.0000 mg | ORAL_TABLET | ORAL | 0 refills | Status: DC | PRN
Start: 2021-11-27 — End: 2022-02-13

## 2021-11-27 MED ORDER — INSULIN GLARGINE-YFGN 100 UNIT/ML ~~LOC~~ SOLN
30.0000 [IU] | Freq: Every day | SUBCUTANEOUS | Status: DC
  Administered 2021-11-28 – 2021-11-29 (×2): 30 [IU] via SUBCUTANEOUS
  Filled 2021-11-27 (×2): qty 0.3

## 2021-11-27 MED ORDER — ASPIRIN EC 81 MG PO TBEC
81.0000 mg | DELAYED_RELEASE_TABLET | Freq: Every day | ORAL | Status: DC
  Administered 2021-11-27 – 2021-11-29 (×3): 81 mg via ORAL
  Filled 2021-11-27 (×3): qty 1

## 2021-11-27 MED ORDER — DOCUSATE SODIUM 100 MG PO CAPS
100.0000 mg | ORAL_CAPSULE | Freq: Two times a day (BID) | ORAL | Status: DC
  Administered 2021-11-27 – 2021-11-29 (×3): 100 mg via ORAL
  Filled 2021-11-27 (×4): qty 1

## 2021-11-27 MED ORDER — GABAPENTIN 300 MG PO CAPS
600.0000 mg | ORAL_CAPSULE | Freq: Two times a day (BID) | ORAL | Status: DC
  Administered 2021-11-27 – 2021-11-29 (×4): 600 mg via ORAL
  Filled 2021-11-27 (×4): qty 2

## 2021-11-27 SURGICAL SUPPLY — 51 items
BAG COUNTER SPONGE SURGICOUNT (BAG) ×2 IMPLANT
BIT DRILL 2.2 SS TIBIAL (BIT) ×1 IMPLANT
BLADE CLIPPER SURG (BLADE) IMPLANT
BNDG ELASTIC 3X5.8 VLCR STR LF (GAUZE/BANDAGES/DRESSINGS) ×2 IMPLANT
BNDG ELASTIC 4X5.8 VLCR STR LF (GAUZE/BANDAGES/DRESSINGS) ×2 IMPLANT
BNDG ESMARK 4X9 LF (GAUZE/BANDAGES/DRESSINGS) ×2 IMPLANT
CLSR STERI-STRIP ANTIMIC 1/2X4 (GAUZE/BANDAGES/DRESSINGS) ×2 IMPLANT
CORD BIPOLAR FORCEPS 12FT (ELECTRODE) ×2 IMPLANT
COVER SURGICAL LIGHT HANDLE (MISCELLANEOUS) ×4 IMPLANT
CUFF TOURN SGL QUICK 18X4 (TOURNIQUET CUFF) ×2 IMPLANT
CUFF TOURN SGL QUICK 24 (TOURNIQUET CUFF)
CUFF TRNQT CYL 24X4X16.5-23 (TOURNIQUET CUFF) IMPLANT
DRAPE INCISE IOBAN 66X45 STRL (DRAPES) IMPLANT
DRAPE OEC MINIVIEW 54X84 (DRAPES) ×1 IMPLANT
DRAPE POUCH INSTRU U-SHP 10X18 (DRAPES) ×1 IMPLANT
DRAPE U-SHAPE 47X51 STRL (DRAPES) ×2 IMPLANT
GAUZE SPONGE 4X4 12PLY STRL (GAUZE/BANDAGES/DRESSINGS) ×2 IMPLANT
GAUZE XEROFORM 1X8 LF (GAUZE/BANDAGES/DRESSINGS) ×2 IMPLANT
GLOVE BIO SURGEON STRL SZ 6.5 (GLOVE) ×2 IMPLANT
GLOVE BIOGEL PI IND STRL 7.0 (GLOVE) ×1 IMPLANT
GLOVE BIOGEL PI IND STRL 8 (GLOVE) ×1 IMPLANT
GLOVE BIOGEL PI INDICATOR 7.0 (GLOVE) ×1
GLOVE BIOGEL PI INDICATOR 8 (GLOVE) ×1
GLOVE ORTHO TXT STRL SZ7.5 (GLOVE) ×2 IMPLANT
GOWN STRL REUS W/ TWL LRG LVL3 (GOWN DISPOSABLE) ×2 IMPLANT
GOWN STRL REUS W/TWL LRG LVL3 (GOWN DISPOSABLE) ×4
K-WIRE 1.6 (WIRE) ×4
K-WIRE FX5X1.6XNS BN SS (WIRE) ×2
KIT BASIN OR (CUSTOM PROCEDURE TRAY) ×2 IMPLANT
KIT TURNOVER KIT A (KITS) ×2 IMPLANT
KWIRE FX5X1.6XNS BN SS (WIRE) IMPLANT
LOOP VESSEL MAXI BLUE (MISCELLANEOUS) IMPLANT
MANIFOLD NEPTUNE II (INSTRUMENTS) ×2 IMPLANT
NEEDLE HYPO 22GX1.5 SAFETY (NEEDLE) IMPLANT
NS IRRIG 1000ML POUR BTL (IV SOLUTION) ×4 IMPLANT
PACK ORTHO EXTREMITY (CUSTOM PROCEDURE TRAY) ×2 IMPLANT
PAD ARMBOARD 7.5X6 YLW CONV (MISCELLANEOUS) ×4 IMPLANT
PAD CAST 4YDX4 CTTN HI CHSV (CAST SUPPLIES) ×1 IMPLANT
PADDING CAST COTTON 4X4 STRL (CAST SUPPLIES) ×2
PEG LOCK SMOOTH 2.2X16MM STE (Peg) ×1 IMPLANT
PEG LOCK SMOOTH 2.2X20MM STE (Peg) ×5 IMPLANT
PLATE CROSSLOCK NAR MINI RT (Plate) ×1 IMPLANT
SCREW LOCK 14X2.7X 3 LD TPR (Screw) IMPLANT
SCREW LOCKING 2.7X13MM (Screw) ×1 IMPLANT
SCREW LOCKING 2.7X14 (Screw) ×2 IMPLANT
SPIKE FLUID TRANSFER (MISCELLANEOUS) ×2 IMPLANT
STRIP CLOSURE SKIN 1/2X4 (GAUZE/BANDAGES/DRESSINGS) ×1 IMPLANT
SUT VIC AB 3-0 FS2 27 (SUTURE) IMPLANT
SYR CONTROL 10ML LL (SYRINGE) IMPLANT
TOWEL GREEN STERILE FF (TOWEL DISPOSABLE) ×2 IMPLANT
TUBING CONNECTING 10 (TUBING) ×2 IMPLANT

## 2021-11-27 NOTE — Interval H&P Note (Signed)
History and Physical Interval Note: ? ?11/27/2021 ?12:54 PM ? ?Destiny Ballard  has presented today for surgery, with the diagnosis of RIGHT WRIST FRACTURE.  The various methods of treatment have been discussed with the patient and family. After consideration of risks, benefits and other options for treatment, the patient has consented to  Procedure(s): ?OPEN REDUCTION INTERNAL FIXATION (ORIF) WRIST FRACTURE (Right) as a surgical intervention.  The patient's history has been reviewed, patient examined, no change in status, stable for surgery.  I have reviewed the patient's chart and labs.  Questions were answered to the patient's satisfaction.   ? ? ?Destiny Ballard ? ? ?

## 2021-11-27 NOTE — Anesthesia Preprocedure Evaluation (Addendum)
Anesthesia Evaluation  Patient identified by MRN, date of birth, ID band Patient awake    Reviewed: Allergy & Precautions, NPO status , Patient's Chart, lab work & pertinent test results  Airway Mallampati: III  TM Distance: >3 FB Neck ROM: Full    Dental  (+) Teeth Intact, Dental Advisory Given   Pulmonary sleep apnea , COPD,  COPD inhaler,     + decreased breath sounds      Cardiovascular hypertension, Pt. on medications and Pt. on home beta blockers + CAD, + Cardiac Stents and +CHF   Rhythm:Regular Rate:Normal  Echo: 1. Left ventricular ejection fraction, by estimation, is 60 to 65%. The  left ventricle has normal function. The left ventricle has no regional  wall motion abnormalities. Left ventricular diastolic parameters are  consistent with Grade I diastolic  dysfunction (impaired relaxation). Elevated left ventricular end-diastolic  pressure.  2. Right ventricular systolic function is normal. The right ventricular  size is normal. Tricuspid regurgitation signal is inadequate for assessing  PA pressure.  3. The mitral valve is normal in structure. Trivial mitral valve  regurgitation. No evidence of mitral stenosis.  4. The aortic valve is normal in structure. Aortic valve regurgitation is  trivial. No aortic stenosis is present. Aortic valve mean gradient  measures 8.4 mmHg. Aortic valve Vmax measures 1.98 m/s.  5. The inferior vena cava is normal in size with greater than 50%  respiratory variability, suggesting right atrial pressure of 3 mmHg.    Neuro/Psych PSYCHIATRIC DISORDERS Anxiety Depression  Neuromuscular disease    GI/Hepatic Neg liver ROS, GERD  Medicated,  Endo/Other  diabetes, Type 2, Insulin Dependent, Oral Hypoglycemic Agents  Renal/GU negative Renal ROS     Musculoskeletal  (+) Arthritis , Osteoarthritis,    Abdominal (+) + obese,   Peds  Hematology negative hematology ROS (+)    Anesthesia Other Findings   Reproductive/Obstetrics                            Anesthesia Physical Anesthesia Plan  ASA: 3  Anesthesia Plan: General   Post-op Pain Management: Regional block*   Induction: Intravenous  PONV Risk Score and Plan: 4 or greater and Ondansetron, Dexamethasone and Treatment may vary due to age or medical condition  Airway Management Planned: LMA  Additional Equipment: None  Intra-op Plan:   Post-operative Plan: Extubation in OR  Informed Consent: I have reviewed the patients History and Physical, chart, labs and discussed the procedure including the risks, benefits and alternatives for the proposed anesthesia with the patient or authorized representative who has indicated his/her understanding and acceptance.     Dental advisory given  Plan Discussed with: CRNA  Anesthesia Plan Comments: (Risks of block discussed in detail prior to procedure. Although risk of phrenic nerve involvement is lower with SCB, it is still possible. Pt unable to tolerate positioning for axillary nerve block. Proceeded with SCB and patient aware she may not go home after surgery if respiratory status is questionable. Of note, patient instructed she needs to wear cpap and possibly oxygen at night.    )      Anesthesia Quick Evaluation

## 2021-11-27 NOTE — Chronic Care Management (AMB) (Signed)
? ? ?Chronic Care Management ?Pharmacy Note ? ?11/27/2021 ?Name:  Destiny Ballard MRN:  [id number] DOB:  November 06, 1950 ? ?Summary:  ?Patient had fall 11/21/2021. Fractured right wrist. Will have surgery later today.  ?Reviewed medications with patient. Coordinated with [facility] to refill needed medications and to have [facility]rolol Rx [facility] [address] at patient request.  ?Reviewed recent home blood glucose readings - last was 113. Denies blood glucose > 200 or < 80. She is due to see Dr Kelton Pillar but would like to wait until after surgery. She has phone number for [facility] to call to make appointment.  ?Noted that she was having diarrhea and fecal incontinence. Saw GI and colonoscopy was done. Dr Fuller Plan had mentioned 5 to 7 days holiday from metformin to see if diarrhea improved. Per patient is never took metformin holiday but since colonoscopy she has not had diarrhea. Discussed that if diarrhea returns we could consider either lower dose of metformin or holiday to make sure metformin was not cause of diarrhea.  ?Patient has [employer]. Reviewed her plan with her and branded meds should be $40/90 days and generic $10 / 90 days. (Its unusual but some of her medications cost more for 30 days on this plan) ? ?Subjective: ?Destiny Ballard is an 71 y.o. year old female who is a primary patient of Copland, Gay Filler, MD.  The CCM team was consulted for assistance with disease management and care coordination needs.   ? ?Collaboration with patient  for  follow up  in response to provider referral for pharmacy case management and/or care coordination services.  ? ?Consent to Services:  ?The patient was given information about Chronic Care Management services, agreed to services, and gave verbal consent prior to initiation of services.  Please see initial visit note for detailed documentation.  ? ?Patient Care Team: ?Copland, Gay Filler, MD as PCP - General  (Family Medicine) ?Brien Few, MD as Consulting Physician (Obstetrics and Gynecology) ?Luretha Rued, RN as Case Manager ?[name], RPH-CPP (Pharmacist) ? ?Recent office visits: ?11/12/2021 - Fam Med (Dr Lorelei Pont) Acute visit for skin issue in gluteal cleft. Recommended barrier cream like desitin 3 to 4 times daily. F/U 2 months for labs. Removed dicyclomine from med list - patient not taking ?08/20/2021 - PCP (Dr Lorelei Pont) Seen for follow up of chronic conditions and pain over her tailbone for 3 months after fall on step. Declined mammogram; referred to GI for colonoscopy. Labs and xray of coccyn ordered.  ? ? ?Recent consult visits: ?11/15/2021 - Podiatry (Dr Sherryle Lis) F/U ingrown left great toenail. Noted to be healing well. Can discontinue soaks and ointments.  ?10/16/2021 - Podiatry (Dr Sherryle Lis) Ingrown left great toenail. Excised ingrown toenail. Prescribed cefadroxi l532m bid and triple antibiotic to nail beds twice a day after soaks.  ?09/26/2021 - GI (Dr SFuller Plan Seen for diarrhea wiht occasional fecal incontinence. Recommended 5-7 day meformin holiday. Started dicyclomine 157mbefore meals and at bedtime. Recommended colonoscopy.  ?06/15/2021 Cardio (Dr KrAgustin CreeF/U CAD and CHF. No med changes mentioned in note but Trulicity dose adjusted on med list from 37m42meekly to 1.5mg22mekly  ? ? ?Hospital visits: ?11/21/2021 - [facility]. Pain in right wrist and hip. X-Ray right hip. No evidence of fracture. Right impaacted writ fracture. Splinted wrist. Prescribed oxycodone/APAP 5/320 every 6 hours as needed #10; F/U with Dr LandMardelle Matte? ?Objective: ? ?Lab Results  ?Component  Value Date  ? CREATININE 0.70 08/22/2021  ? CREATININE 0.53 (L) 02/09/2021  ? CREATININE 0.61 11/18/2020  ? ? ?Lab Results  ?Component Value Date  ? HGBA1C 7.3 (H) 08/22/2021  ? ?Last diabetic Eye exam:  ?Lab Results  ?Component Value Date/Time  ? HMDIABEYEEXA No Retinopathy 03/08/2021 12:00 AM  ?   ?Last diabetic Foot exam: No results found for: HMDIABFOOTEX  ? ?   ?Component Value Date/Time  ? CHOL 130 01/01/2021 1144  ? TRIG 266 (H) 01/01/2021 1144  ? HDL 44 01/01/2021 1144  ? CHOLHDL 3.0 01/01/2021 1144  ? CHOLHDL 3 09/09/2019 1128  ? VLDL 72.6 (H) 09/09/2019 1128  ? LDLCALC 45 01/01/2021 1144  ? LDLDIRECT 64.0 09/09/2019 1128  ? ? ? ?  Latest Ref Rng & Units 02/09/2021  ?  4:08 PM 11/18/2020  ?  6:30 PM 09/04/2020  ?  9:20 AM  ?Hepatic Function  ?Total Protein 6.0 - 8.5 g/dL 6.3   7.2   6.6    ?Albumin 3.7 - 4.7 g/dL 3.9   3.6   3.7    ?AST 0 - 40 IU/L 14   20   19     ?ALT 0 - 32 IU/L 11   20   28     ?Alk Phosphatase 44 - 121 IU/L 82   78   101    ?Total Bilirubin 0.0 - 1.2 mg/dL <0.2   0.3   0.6    ? ? ?Lab Results  ?Component Value Date/Time  ? TSH 2.361 01/27/2016 02:52 AM  ? TSH 0.83 03/24/2014 11:14 AM  ? TSH 0.88 09/10/2013 02:41 PM  ? ? ? ?  Latest Ref Rng & Units 02/09/2021  ?  4:08 PM 11/18/2020  ?  6:30 PM 09/04/2020  ?  9:20 AM  ?CBC  ?WBC 3.4 - 10.8 x10E3/uL 10.5   10.8   9.0    ?Hemoglobin 11.1 - 15.9 g/dL 12.0   12.6   12.1    ?Hematocrit 34.0 - 46.6 % 39.9   39.6   37.6    ?Platelets 150 - 450 x10E3/uL 265   287   236.0    ? ? ?Lab Results  ?Component Value Date/Time  ? VD25OH 26 (L) 09/16/2011 10:58 AM  ? ? ?Clinical ASCVD: Yes  ?The 10-year ASCVD risk score (Arnett DK, et al., 2019) is: 44.6% ?  Values used to calculate the score: ?    Age: 74 years ?    Sex: Female ?    Is Non-Hispanic African American: No ?    Diabetic: Yes ?    Tobacco smoker: Yes ?    Systolic Blood Pressure: 982 mmHg ?    Is BP treated: Yes ?    HDL Cholesterol: 44 mg/dL ?    Total Cholesterol: 130 mg/dL   ? ?Social History  ? ?Tobacco Use  ?Smoking Status Never  ? Passive exposure: Yes  ?Smokeless Tobacco Never  ? ?BP Readings from Last 3 Encounters:  ?11/21/21 (!) 142/74  ?11/12/21 122/60  ?10/01/21 135/67  ? ?Pulse Readings from Last 3 Encounters:  ?11/21/21 74  ?11/12/21 85  ?10/01/21 80  ? ?Wt Readings from Last 3  Encounters:  ?11/23/21 165 lb (74.8 kg)  ?11/21/21 160 lb (72.6 kg)  ?11/12/21 165 lb 6.4 oz (75 kg)  ? ? ?Assessment: Review of patient past medical history, allergies, medications, health status, including review of consultants reports, laboratory and other test data, was performed as part of comprehensive evaluation  and provision of chronic care management services.  ? ?SDOH:  (Social Determinants of Health) assessments and interventions performed:  ? ? ? ?CCM Care Plan ? ?Allergies  ?Allergen Reactions  ? Prednisone Other (See Comments)  ?  REACTION: mood swings, nightmares. "Shot doesn't bother me, reaction is just with the pill" she states she has had the steroid injections before. From our records methylprednisone was given to her in 2013 without any complications.  ? ? ?Medications Reviewed Today   ? ? Reviewed by Effie Berkshire, MD (Physician) on 11/27/21 at 1023  Med List Status: Complete  ? ?Medication Order Taking? Sig Documenting Provider Last Dose Status Informant  ?albuterol (VENTOLIN HFA) 108 (90 Base) MCG/ACT inhaler [id number] Yes INHALE 2 PUFFS INTO THE LUNGS EVERY EIGHT HOURS AS NEEDED FOR WHEEZING OR SHORTNESS OF BREATH. Jonetta Osgood, MD  Active Self  ?aspirin EC 81 MG tablet [id number] Yes Take 81 mg by mouth daily. [provider]  Active Self  ?calcium carbonate (TUMS EX) 750 MG chewable tablet [id number] Yes Chew 1 tablet by mouth daily as needed for heartburn. [provider]  Active Self  ?Cholecalciferol (VITAMIN D3) 1000 UNITS CAPS [id number] Yes Take 1,000 Units by mouth daily. [provider]  Active Self  ?dapagliflozin propanediol (FARXIGA) 10 MG TABS tablet [id number] Yes Take 1 tablet (10 mg total) by mouth daily. Shamleffer, Melanie Crazier, MD  Active Self  ?DIPHENHYDRAMINE HCL PO [id number] Yes Take 25 mg by mouth every 6 (six) hours as needed for sleep or allergies. [provider]  Active Self  ?Dulaglutide (TRULICITY) 3 RA/0.7MA SOPN  [id number] Yes Inject 3 mg into the skin once a week. [provider]  Active Self  ?fexofenadine (ALLEGRA) 180 MG tablet [id number] Yes Take 180 mg by mouth daily. [provider]  Active Self  ?FL

## 2021-11-27 NOTE — Addendum Note (Signed)
Addendum  created 11/27/21 1849 by Merlinda Frederick, MD  ? Clinical Note Signed  ?  ?

## 2021-11-27 NOTE — Discharge Instructions (Signed)
Diet: As you were doing prior to hospitalization   Shower:  May shower but keep the wounds dry, use an occlusive plastic wrap, NO SOAKING IN TUB.  If the bandage gets wet, change with a clean dry gauze.  If you have a splint on, leave the splint in place and keep the splint dry with a plastic bag.  Dressing:  You may change your dressing 3-5 days after surgery, unless you have a splint.  If you have a splint, then just leave the splint in place and we will change your bandages during your first follow-up appointment.    If you had hand or foot surgery, we will plan to remove your stitches in about 2 weeks in the office.  For all other surgeries, there are sticky tapes (steri-strips) on your wounds and all the stitches are absorbable.  Leave the steri-strips in place when changing your dressings, they will peel off with time, usually 2-3 weeks.  Activity:  Increase activity slowly as tolerated, but follow the weight bearing instructions below.  The rules on driving is that you can not be taking narcotics while you drive, and you must feel in control of the vehicle.    Weight Bearing:   Non weight bearing with right arm  To prevent constipation: you may use a stool softener such as -  Colace (over the counter) 100 mg by mouth twice a day  Drink plenty of fluids (prune juice may be helpful) and high fiber foods Miralax (over the counter) for constipation as needed.    Itching:  If you experience itching with your medications, try taking only a single pain pill, or even half a pain pill at a time.  You may take up to 10 pain pills per day, and you can also use benadryl over the counter for itching or also to help with sleep.   Precautions:  If you experience chest pain or shortness of breath - call 911 immediately for transfer to the hospital emergency department!!  If you develop a fever greater that 101 F, purulent drainage from wound, increased redness or drainage from wound, or calf pain --  Call the office at [phone]                                                Follow- Up Appointment:  Please call for an appointment to be seen in 2 weeks [place] - [phone]     

## 2021-11-27 NOTE — Anesthesia Procedure Notes (Signed)
Procedure Name: LMA Insertion ?Date/Time: 11/27/2021 1:52 PM ?Performed by: Niel Hummer, CRNA ?Pre-anesthesia Checklist: Patient identified, Emergency Drugs available, Suction available and Patient being monitored ?Patient Re-evaluated:Patient Re-evaluated prior to induction ?Oxygen Delivery Method: Circle system utilized ?Preoxygenation: Pre-oxygenation with 100% oxygen ?Induction Type: IV induction ?LMA: LMA inserted ?LMA Size: 4.0 ?Number of attempts: 1 ?Dental Injury: Teeth and Oropharynx as per pre-operative assessment  ? ? ? ? ?

## 2021-11-27 NOTE — Progress Notes (Signed)
AssistedDr. Smith Robert with right, supraclavicular ultrasound guided nerve block. Side rails up, monitors on throughout procedure. See vital signs in flow sheet. Tolerated Procedure well. ? ?

## 2021-11-27 NOTE — Anesthesia Postprocedure Evaluation (Signed)
Anesthesia Post Note ? ?Patient: Destiny Ballard ? ?Procedure(s) Performed: OPEN REDUCTION INTERNAL FIXATION (ORIF) WRIST FRACTURE (Right: Wrist) ? ?  ? ?Patient location during evaluation: PACU ?Anesthesia Type: General ?Level of consciousness: awake and alert ?Pain management: pain level controlled ?Vital Signs Assessment: post-procedure vital signs reviewed and stable ?Respiratory status: spontaneous breathing, nonlabored ventilation, respiratory function stable and patient connected to nasal cannula oxygen ?Cardiovascular status: blood pressure returned to baseline and stable ?Postop Assessment: no apparent nausea or vomiting ?Anesthetic complications: no ?Comments: Patient unable to be weaned from oxygen in PACU. Has completed incentive spirometry, breathing treatments, and other pulmonary toilet.  Breath sounds are present but decreased. Has a history of COPD and sleep apnea. Patient states she sleeps in a chair upright at night but does not wear CPAP. Given her medical history and some expected phrenic nerve involvement from the nerve block, it is best that she remain inpatient until her block begins to resolve and her pulmonary status returns to baseline of upper 80s-low 90s on room air. Norton Blizzard, MD  ? ? ? ?No notable events documented. ? ?Last Vitals:  ?Vitals:  ? 11/27/21 1815 11/27/21 1830  ?BP: (!) 127/55 133/61  ?Pulse: 86 88  ?Resp: 15 17  ?Temp:    ?SpO2: 92% 90%  ?  ?Last Pain:  ?Vitals:  ? 11/27/21 1830  ?TempSrc:   ?PainSc: 3   ? ? ?  ?  ?  ?  ?  ?  ? ?Merlinda Frederick ? ? ? ? ?

## 2021-11-27 NOTE — Op Note (Signed)
11/27/2021 ? ?3:21 PM ? ?PATIENT:  Destiny Ballard   ? ?PRE-OPERATIVE DIAGNOSIS: Right comminuted intra-articular distal radius fracture with displacement ? ?POST-OPERATIVE DIAGNOSIS:  Same ? ?PROCEDURE:  ORIF DISTAL RADIUS FRACTURE, more than 3 pieces, with 3 views of the right wrist taken postoperatively ? ?SURGEON:  Johnny Bridge, MD ? ?PHYSICIAN ASSISTANT: Merlene Pulling, PA-C, present and scrubbed throughout the case, critical for completion in a timely fashion, and for retraction, instrumentation, and closure. ? ?ANESTHESIA:   General ? ?ESTIMATED BLOOD LOSS: Minimal ? ?PREOPERATIVE INDICATIONS:  Destiny Ballard is a  71 y.o. female with a diagnosis of RIGHT WRIST FRACTURE who elected for surgical management due to fracture displacement.  She had substantial displacement with comminution. ? ?The risks benefits and alternatives were discussed with the patient preoperatively including but not limited to the risks of infection, bleeding, nerve injury, cardiopulmonary complications, the need for revision surgery, tendon rupture, hardware prominence, hardware failure, nonunion, malunion, post-traumatic arthritis, regional pain syndrome, among others, and the patient was willing to proceed. ? ?OPERATIVE IMPLANTS: Biomet DVR volar plate with 2 proximal cortical screws and multiple distal interlocking smooth pegs, using the short narrow plate.  ? ?OPERATIVE FINDINGS: Comminution of the distal radius fracture.  There was a 10 mm x 5 mm central piece of articular cartilage that was impacted within the metaphysis about 1 cm, or maybe even 2 cm proximal.  This was not amenable to repair, I did remove the articular piece of this, and used the remaining bone as bone graft within the metaphysis.  This allowed for a much more concentric reduction of the dorsal segment.  The fracture was almost exclusively on the dorsal side, there is just a very small amount of the fracture that extended across the volar aspect.  I debated  using the volar plate, but did not feel that it was going to provide substantial additional fixation, particular because the majority of the fracture line is posterior and articular. ? ?OPERATIVE PROCEDURE: The patient was brought to the operating room and placed in the supine position. General anesthesia was administered. IV antibiotics were given. Time out was performed. The upper extremity was prepped and draped in usual sterile fashion. The arm was elevated and exsanguinated and the tourniquet was inflated at 239m hg.   ? ?Volar approach to the distal radius was carried out, and the flexor carpi radialis was retracted radially. The radial artery was protected throughout the case.  ? ?Deep dissection was carried down, and the pronator quadratus was elevated off of the radius. The fracture site was identified and cleaned and reduced anatomically.  The fracture was primarily indirect, I had to work through the fracture site itself along the radial aspect because the fracture barely escaped the volar side.  Initially I attempted to reduce the fracture but realized that there was a reasonable sized articular piece that was stuck in the metaphysis blocking my reduction.  This was completely devitalized, and unable to be controlled in a way that would make sense for articular reduction, so this was extracted.  I was then able to reduce the articular surface into a much more congruent position, and then reduced the radial styloid piece as well as I could and then held this provisionally with a K wire. ? ?C-arm used to confirm alignment.  ? ?The height was essentially anatomic, because she did not lose much in the way of height because the fracture did not even exit on the volar side, so I  was primarily focused on the inclination and the tilt.  I applied a volar plate. A K wire was used to confirm appropriate position of the plate, and once I was satisfied with the overall alignment I was able to secure the plate  proximally with a cortical screw.  ? ?I then secured the fracture with multiple smooth interlocking pegs distally, and confirmed that none of these were in the joint, and none of these were penetrating the dorsal cortex. I also secured the plate proximally with one more cortical screw.  ? ?The wounds were irrigated copiously, and I used 3-0 subcutaneous Vicryl for the skin and Steri-Strips and sterile gauze and a volar splint. The tourniquet was released with excellent hemostasis and no evidence for injury to the radial artery. She was awakened and returned back in stable and satisfactory condition. There were no complications and She tolerated the procedure well. ? ? ?

## 2021-11-27 NOTE — Anesthesia Postprocedure Evaluation (Deleted)
Anesthesia Post Note ? ?Patient: Destiny Ballard ? ?Procedure(s) Performed: OPEN REDUCTION INTERNAL FIXATION (ORIF) WRIST FRACTURE (Right: Wrist) ? ?  ? ?Patient location during evaluation: PACU ?Anesthesia Type: General ?Level of consciousness: awake and alert ?Pain management: pain level controlled ?Vital Signs Assessment: post-procedure vital signs reviewed and stable ?Respiratory status: spontaneous breathing, nonlabored ventilation, respiratory function stable and patient connected to nasal cannula oxygen ?Cardiovascular status: blood pressure returned to baseline and stable ?Postop Assessment: no apparent nausea or vomiting ?Anesthetic complications: no ? ? ?No notable events documented. ?  ?  ?  ? ?Effie Berkshire ? ? ? ? ?

## 2021-11-27 NOTE — Anesthesia Procedure Notes (Signed)
Anesthesia Regional Block: Supraclavicular block  ? ?Pre-Anesthetic Checklist: , timeout performed,  Correct Patient, Correct Site, Correct Laterality,  Correct Procedure, Correct Position, site marked,  Risks and benefits discussed,  Surgical consent,  Pre-op evaluation,  At surgeon's request and post-op pain management ? ?Laterality: Right ? ?Prep: chloraprep     ?  ?Needles:  ?Injection technique: Single-shot ? ?Needle Type: Echogenic Stimulator Needle   ? ? ?Needle Length: 9cm  ?Needle Gauge: 21  ? ? ? ?Additional Needles: ? ? ?Procedures:,,,, ultrasound used (permanent image in chart),,    ?Narrative:  ?Start time: 11/27/2021 12:20 PM ?End time: 11/27/2021 12:25 PM ?Injection made incrementally with aspirations every 5 mL. ? ?Performed by: Personally  ?Anesthesiologist: Effie Berkshire, MD ? ?Additional Notes: ?Patient tolerated the procedure well. Local anesthetic introduced in an incremental fashion under minimal resistance after negative aspirations. No paresthesias were elicited. After completion of the procedure, no acute issues were identified and patient continued to be monitored by RN.  ? ? ? ? ? ?

## 2021-11-27 NOTE — Progress Notes (Signed)
Patient called RN to patients room stating she feels pain in her chest, is dysphoretic and her right arm "feels very heavy".  Dr. Smith Robert notified, EKG ordered.  Dr. Smith Robert arrived at bedside within 2 minutes  ?

## 2021-11-27 NOTE — Transfer of Care (Signed)
Immediate Anesthesia Transfer of Care Note ? ?Patient: Destiny Ballard ? ?Procedure(s) Performed: OPEN REDUCTION INTERNAL FIXATION (ORIF) WRIST FRACTURE (Right: Wrist) ? ?Patient Location: PACU ? ?Anesthesia Type:General ? ?Level of Consciousness: awake, alert , oriented and patient cooperative ? ?Airway & Oxygen Therapy: Patient Spontanous Breathing and Patient connected to face mask oxygen ? ?Post-op Assessment: Report given to RN, Post -op Vital signs reviewed and stable and Patient moving all extremities X 4 ? ?Post vital signs: Reviewed and stable ? ?Last Vitals:  ?Vitals Value Taken Time  ?BP 130/56 11/27/21 1553  ?Temp 37.2 ?C 11/27/21 1553  ?Pulse 91 11/27/21 1559  ?Resp 18 11/27/21 1559  ?SpO2 94 % 11/27/21 1559  ?Vitals shown include unvalidated device data. ? ?Last Pain:  ?Vitals:  ? 11/27/21 1553  ?TempSrc:   ?PainSc: 0-No pain  ?   ? ?Patients Stated Pain Goal: 5 (11/27/21 1136) ? ?Complications: No notable events documented. ?

## 2021-11-27 NOTE — Patient Instructions (Signed)
Destiny Ballard ?It was a pleasure speaking with you  ?Below is a summary of your health goals and care plan ? ?Patient self care activities - Over the next 90 days patient will: ?Take Medications as recommended ?Contact provider's office or clinical pharmacist if you have an problems of questions about medications or medication costs.  ?Call [facility] office to make follow up for diabetes - [phone] ?Remember to call for follow up appointment with Dr Lorelei Pont for July 2023 - [phone] ?Continue to check blood glucose daily:  ?Home blood glucose goals  ?Fasting blood glucose goal (before meals) = 80 to 130 ?Blood glucose goal after a meal = less than 180  ? ? ?If you have any questions or concerns, please feel free to contact me either at the phone number below or with a MyChart message.  ? ?Keep up the good work! ? ?[name], PharmD ?Clinical Pharmacist ?[facility] ?[place] ?[phone] (direct line)  ?[phone] (main office number) ? ? ?Chronic Care Management Care Plan ? ?Chronic Obstructive Pulmonary Disease: ?Pharmacist Clinical Goal(s):  ?Over the next 90 days, patient will continue to adhere to current inhaler regimen and identify potential cost savings through collaboration with PharmD and provider.  ?Current treatment:  ?Spiriva 2.'5mg'$  - 1 inhalation once a day;  ?Albuterol inhaler as needed for shortness of breath and wheezing;  ?montelukast '10mg'$  daily at bedtime;  ?Interventions:  ?Recommended continue current regimen.  ?Take medications as prescribed ?Collaborate with provider on medication access solutions ? ?Hypertension / High Blood Pressure: ?Pharmacist Clinical Goal(s):  ?Over the next 90 days, patient will achieve adherence to monitoring guidelines and medication adherence to achieve therapeutic efficacy. Also will achieve control of blood pressure as evidenced by home BP readings < 140/90 through collaboration with PharmD and provider.  ?BP Readings  from Last 3 Encounters:  ?11/21/21 (!) 142/74  ?11/12/21 122/60  ?10/01/21 135/67  ? ?Current treatment:  ?Valsartan '160mg'$  daily ?metoprolol succinate '100mg'$  daily ?hydralazine '25mg'$  -Take 2 tablets = '50mg'$  twice a day  ?furosemide '20mg'$  daily ?Interventions: ?Educated on BP goals and importance of obtaining BP goals to decrease risk of stroke and renal disease ?Reviewed refill history with pharmacy.  ?Focus on medication adherence by taking all medications as prescribed ?Check blood pressure 2 to 3 times per week, document, and provide at future appointments ? ?Diabetes: ?Pharmacist Clinical Goal(s):  ?Over the next 90 days, patient will achieve adherence to monitoring guidelines and medication adherence to achieve therapeutic efficacy ?Achieve control of diabetes as evidenced by meeting goal of A1c <7.0% through collaboration with PharmD and provider.  ?Lab Results  ?Component Value Date  ? HGBA1C 7.3 (H) 08/22/2021  ? ?Current treatment: ?Farxiga '10mg'$  daily ?Basaglar 30 units daily ?Trulicity '3mg'$  weekly ?metformin ER '500mg'$  - take 2 tablets twice a day ?Interventions: ?Educated on importance of taking all medication for diabetes as prescribed.  ?Discussed taking above medication regimen as prescribed to reach A1c goal ?Reviewed signs and symptoms of hypoglycemia and how to treat.  ?Check blood glucose 2 times per day; document, and provide at future appointments ?Collaborate with clinical pharmacist and provider on medication access solutions. Provided discount coupons for Farxiga, Trulicity and Basaglar (max copay $25)  ? ?Depression with Anxiety:  ?Pharmacist Clinical Goal(s):  ?Over the next 90 days, patient will continue to adhere to medication regimen for depression and anxiety while minimizing potential side effects ?Current treatment:  ?trazodone '50mg'$  - take 0.5 tablet = '25mg'$  at bedtime as  needed ?fluoxetine '40mg'$  daily ?Interventions:  ?Recommend continue current regimen for depression and  anxiety. ? ?Hyperlipidemia / CAD:  ?Pharmacist Clinical Goal(s):  ?Over the next 90 days, patient will maintain LDL of <70 ? ?Lab Results  ?Component Value Date  ? LDLCALC 45 01/01/2021  ? ?Current treatment:  ?Rosuvastatin '20mg'$  daily  ?Aspirin '81mg'$  daily ?Interventions:  ?Discussed following diet low in sugar and saturated fat.  ?Discussed improtance of taking statin in prevention of cardiovascular disease ?Continue current medications as prescribed for heart / cholesterol   ?Medication Management: ?Pharmacist Clinical Goal(s): Over the next 90 days, patient will take medications as prescribed ?Current pharmacy: [facility] [place] ?Interventions:  ?Assisted with requested needed refills from pharmacy - metoprolol, rosuvastatin, montelukast, Trulicity, Faxiga, metoformin, hydralazine and Basaglar.  ? ? ?The patient verbalized understanding of instructions, educational materials, and care plan provided today and agreed to receive a mailed copy of patient instructions, educational materials, and care plan.   ?

## 2021-11-28 ENCOUNTER — Observation Stay (HOSPITAL_COMMUNITY): Payer: Medicare Other

## 2021-11-28 DIAGNOSIS — I2510 Atherosclerotic heart disease of native coronary artery without angina pectoris: Secondary | ICD-10-CM | POA: Diagnosis not present

## 2021-11-28 DIAGNOSIS — I5032 Chronic diastolic (congestive) heart failure: Secondary | ICD-10-CM | POA: Diagnosis not present

## 2021-11-28 DIAGNOSIS — R0600 Dyspnea, unspecified: Secondary | ICD-10-CM | POA: Diagnosis not present

## 2021-11-28 DIAGNOSIS — I110 Hypertensive heart disease with heart failure: Secondary | ICD-10-CM | POA: Diagnosis not present

## 2021-11-28 DIAGNOSIS — J449 Chronic obstructive pulmonary disease, unspecified: Secondary | ICD-10-CM | POA: Diagnosis not present

## 2021-11-28 DIAGNOSIS — R0902 Hypoxemia: Secondary | ICD-10-CM

## 2021-11-28 DIAGNOSIS — S52571A Other intraarticular fracture of lower end of right radius, initial encounter for closed fracture: Secondary | ICD-10-CM | POA: Diagnosis not present

## 2021-11-28 DIAGNOSIS — E1140 Type 2 diabetes mellitus with diabetic neuropathy, unspecified: Secondary | ICD-10-CM | POA: Diagnosis not present

## 2021-11-28 LAB — CBC
HCT: 38.6 % (ref 36.0–46.0)
Hemoglobin: 11.4 g/dL — ABNORMAL LOW (ref 12.0–15.0)
MCH: 25.3 pg — ABNORMAL LOW (ref 26.0–34.0)
MCHC: 29.5 g/dL — ABNORMAL LOW (ref 30.0–36.0)
MCV: 85.6 fL (ref 80.0–100.0)
Platelets: 224 10*3/uL (ref 150–400)
RBC: 4.51 MIL/uL (ref 3.87–5.11)
RDW: 13.9 % (ref 11.5–15.5)
WBC: 9.9 10*3/uL (ref 4.0–10.5)
nRBC: 0 % (ref 0.0–0.2)

## 2021-11-28 LAB — BASIC METABOLIC PANEL
Anion gap: 8 (ref 5–15)
BUN: 12 mg/dL (ref 8–23)
CO2: 27 mmol/L (ref 22–32)
Calcium: 8.8 mg/dL — ABNORMAL LOW (ref 8.9–10.3)
Chloride: 104 mmol/L (ref 98–111)
Creatinine, Ser: 0.58 mg/dL (ref 0.44–1.00)
GFR, Estimated: >60 mL/min (ref >60)
Glucose, Bld: 228 mg/dL — ABNORMAL HIGH (ref 70–99)
Potassium: 4.5 mmol/L (ref 3.5–5.1)
Sodium: 139 mmol/L (ref 135–145)

## 2021-11-28 LAB — GLUCOSE, CAPILLARY
Glucose-Capillary: 224 mg/dL — ABNORMAL HIGH (ref 70–99)
Glucose-Capillary: 199 mg/dL — ABNORMAL HIGH (ref 70–99)
Glucose-Capillary: 159 mg/dL — ABNORMAL HIGH (ref 70–99)

## 2021-11-28 MED ORDER — POTASSIUM CHLORIDE CRYS ER 10 MEQ PO TBCR
10.0000 meq | EXTENDED_RELEASE_TABLET | Freq: Once | ORAL | Status: AC
  Administered 2021-11-28: 10 meq via ORAL
  Filled 2021-11-28: qty 1

## 2021-11-28 MED ORDER — INSULIN ASPART 100 UNIT/ML IJ SOLN: 0.0000 [IU] | Freq: Every day | INTRAMUSCULAR | Status: DC

## 2021-11-28 MED ORDER — FUROSEMIDE 10 MG/ML IJ SOLN
20.0000 mg | Freq: Once | INTRAMUSCULAR | Status: AC
  Administered 2021-11-28: 20 mg via INTRAVENOUS
  Filled 2021-11-28: qty 2

## 2021-11-28 MED ORDER — INSULIN ASPART 100 UNIT/ML IJ SOLN
0.0000 [IU] | Freq: Three times a day (TID) | INTRAMUSCULAR | Status: DC
  Administered 2021-11-28: 3 [IU] via SUBCUTANEOUS
  Administered 2021-11-28: 5 [IU] via SUBCUTANEOUS
  Administered 2021-11-29 (×2): 2 [IU] via SUBCUTANEOUS

## 2021-11-28 NOTE — Progress Notes (Signed)
? ? ? ?Subjective: ?1 Day Post-Op s/p Procedure(s): ?OPEN REDUCTION INTERNAL FIXATION (ORIF) WRIST FRACTURE ? ? Patient is alert, oriented. States she has minimal feeling through her shoulder and hand today. Is able to move her fingers this morning. Denies chest pain, SOB, she has been working on incentive spirometry. Calf pain. No nausea/vomiting. No other complaints. ? ? ?Objective:  ?PE: ?VITALS:   ?Vitals:  ? 11/27/21 2004 11/28/21 0000 11/28/21 3710 11/28/21 0804  ?BP: (!) 145/69 (!) 111/51 (!) 141/59   ?Pulse: (!) 101 92 80   ?Resp: '20 16 18   '$ ?Temp: 98.5 ?F (36.9 ?C) 97.7 ?F (36.5 ?C) 97.8 ?F (36.6 ?C)   ?TempSrc: Oral Oral Oral   ?SpO2: 94% 94% 95% 95%  ?Weight:      ?Height:      ? ?General: sitting up in bed, in no acute distress ?Resp: nasal canula in place, normal respiratory effort ?MSK: RUE in splint. Able to flex and extend all fingers. Sensation has not returned to fingers. Capillary refill intact.  ? ? ?LABS ? ?Results for orders placed or performed during the hospital encounter of 11/27/21 (from the past 24 hour(s))  ?Glucose, capillary     Status: Abnormal  ? Collection Time: 11/27/21 11:36 AM  ?Result Value Ref Range  ? Glucose-Capillary 113 (H) 70 - 99 mg/dL  ?Hemoglobin A1c per protocol     Status: Abnormal  ? Collection Time: 11/27/21 11:37 AM  ?Result Value Ref Range  ? Hgb A1c MFr Bld 7.0 (H) 4.8 - 5.6 %  ? Mean Plasma Glucose 154.2 mg/dL  ?Basic metabolic panel per protocol     Status: Abnormal  ? Collection Time: 11/27/21 11:37 AM  ?Result Value Ref Range  ? Sodium 136 135 - 145 mmol/L  ? Potassium 3.3 (L) 3.5 - 5.1 mmol/L  ? Chloride 98 98 - 111 mmol/L  ? CO2 29 22 - 32 mmol/L  ? Glucose, Bld 125 (H) 70 - 99 mg/dL  ? BUN 10 8 - 23 mg/dL  ? Creatinine, Ser 0.56 0.44 - 1.00 mg/dL  ? Calcium 9.1 8.9 - 10.3 mg/dL  ? GFR, Estimated >60 >60 mL/min  ? Anion gap 9 5 - 15  ?CBC per protocol     Status: Abnormal  ? Collection Time: 11/27/21 11:37 AM  ?Result Value Ref Range  ? WBC 9.1 4.0 - 10.5  K/uL  ? RBC 4.58 3.87 - 5.11 MIL/uL  ? Hemoglobin 11.6 (L) 12.0 - 15.0 g/dL  ? HCT 37.9 36.0 - 46.0 %  ? MCV 82.8 80.0 - 100.0 fL  ? MCH 25.3 (L) 26.0 - 34.0 pg  ? MCHC 30.6 30.0 - 36.0 g/dL  ? RDW 14.1 11.5 - 15.5 %  ? Platelets 242 150 - 400 K/uL  ? nRBC 0.0 0.0 - 0.2 %  ?Glucose, capillary     Status: Abnormal  ? Collection Time: 11/27/21  3:53 PM  ?Result Value Ref Range  ? Glucose-Capillary 156 (H) 70 - 99 mg/dL  ?CBC     Status: Abnormal  ? Collection Time: 11/28/21  3:25 AM  ?Result Value Ref Range  ? WBC 9.9 4.0 - 10.5 K/uL  ? RBC 4.51 3.87 - 5.11 MIL/uL  ? Hemoglobin 11.4 (L) 12.0 - 15.0 g/dL  ? HCT 38.6 36.0 - 46.0 %  ? MCV 85.6 80.0 - 100.0 fL  ? MCH 25.3 (L) 26.0 - 34.0 pg  ? MCHC 29.5 (L) 30.0 - 36.0 g/dL  ? RDW 13.9 11.5 -  15.5 %  ? Platelets 224 150 - 400 K/uL  ? nRBC 0.0 0.0 - 0.2 %  ?Basic metabolic panel     Status: Abnormal  ? Collection Time: 11/28/21  3:25 AM  ?Result Value Ref Range  ? Sodium 139 135 - 145 mmol/L  ? Potassium 4.5 3.5 - 5.1 mmol/L  ? Chloride 104 98 - 111 mmol/L  ? CO2 27 22 - 32 mmol/L  ? Glucose, Bld 228 (H) 70 - 99 mg/dL  ? BUN 12 8 - 23 mg/dL  ? Creatinine, Ser 0.58 0.44 - 1.00 mg/dL  ? Calcium 8.8 (L) 8.9 - 10.3 mg/dL  ? GFR, Estimated >60 >60 mL/min  ? Anion gap 8 5 - 15  ? ? ?No results found. ? ?Assessment/Plan: ?Right distal radius fracture ?1 Day Post-Op s/p Procedure(s): ?OPEN REDUCTION INTERNAL FIXATION (ORIF) WRIST FRACTURE ?- patient was unable to be weaned from oxygen in PACU, anesthesia recommended she stay in the hospital until block begins to wear off and she can maintain her normal pulmonary status of baseline upper 80's-low 90's on room air ?- although motor function has improved block still in place with limited sensation, we will continue Oxygen use this morning with trial of weaning oxygen this afternoon.  ? ?Weightbearing: ok to weight bear through right shoulder and elbow if needed for ambulation with walker, no weight bearing through right  wrist ?Insicional and dressing care: none, splint in place ?Pain control: continue current regimen ?Follow - up plan: 2 weeks with Dr. Mardelle Matte ?Dispo: pending oxygen weaning, possibly this evening or tomorrow ? ?Contact information:   ?Merlene Pulling, [place] Weekdays 8-5  ?After hours and holidays please check [email or web address] for group call information for [facility] ? ?Destiny Ballard ?11/28/2021, 8:41 AM  ?

## 2021-11-28 NOTE — Evaluation (Signed)
Physical Therapy Evaluation ?Patient Details ?Name: Destiny Ballard ?MRN: [id number] ?DOB: [birth year] ?Today's Date: 11/28/2021 ? ?History of Present Illness ? Patient is 71 y.o. female s/p ORIF for Rt comminuted intra-articular distal radius fracture with displacement. Injury occured due to a fall on 11/21/2021 while playing with her grandson kicking a ball. ? ?  ?Clinical Impression ? Destiny Ballard is a 71 y.o. female POD 1 s/p Rt wrist ORIF. Patient reports independence with mobility at baseline. Patient is now limited by functional impairments (see PT problem list below) and requires min guard for transfers and gait with no device. Patient was able to ambulate ~400 feet with RW and min guard and assist for O2 management. Patient will benefit from continued skilled PT interventions to address impairments and progress towards PLOF. Acute PT will follow to progress mobility and stair training in preparation for safe discharge home.    ?   ? ?Recommendations for follow up therapy are one component of a multi-disciplinary discharge planning process, led by the attending physician.  Recommendations may be updated based on patient status, additional functional criteria and insurance authorization. ? ?PT Recommendation   ?Follow Up Recommendations Follow physician's recommendations for discharge plan and follow up therapies Filed 11/28/2021 0913  ?Assistance recommended at discharge Frequent or constant Supervision/Assistance Filed 11/28/2021 0913  ?Patient can return home with the following A little help with walking and/or transfers, A little help with bathing/dressing/bathroom, Help with stairs or ramp for entrance, Assist for transportation, Assistance with cooking/housework Filed 11/28/2021 0913  ?Functional Status Assessment Patient has had a recent decline in their functional status and demonstrates the ability to make significant improvements in function in a reasonable and predictable amount of time. Filed  11/28/2021 0913  ?PT equipment None recommended by PT Filed 11/28/2021 0913  ? ? ?Mobility ? Bed Mobility ?  ?  ?  ?  ?  ?  ?  ?General bed mobility comments: OOB in recliner ?  ? ?Transfers ?Overall transfer level: Needs assistance ?Equipment used: None ?Transfers: Sit to/from Stand ?Sit to Stand: Min guard, Supervision ?  ?  ?  ?  ?  ?General transfer comment: guard/supervision, pt able to power up with Lt UE from recliner. steady once standing. ?  ? ?Ambulation/Gait ?Ambulation/Gait assistance: Min guard ?Gait Distance (Feet): 400 Feet ?Assistive device: Rolling walker (2 wheels) ?Gait Pattern/deviations: Step-through pattern, Decreased stride length, Shuffle, Drifts right/left ?Gait velocity: decr ?  ?  ?General Gait Details: slow gait and mildly unsteady with WBOS and shuffled steps. however no overt LOB thorughout gait. pt desats on RA and required 2L/min for sufficient SpO2. ? ?Stairs ?  ?  ?  ?  ?  ? ?Wheelchair Mobility ?  ? ?Modified Rankin (Stroke Patients Only) ?  ? ?  ? ?Balance Overall balance assessment: Needs assistance, Mild deficits observed, not formally tested ?Sitting-balance support: Feet supported ?Sitting balance-Leahy Scale: Good ?  ?  ?Standing balance support: During functional activity, No upper extremity supported ?Standing balance-Leahy Scale: Good ?  ?  ?  ?  ?  ?  ?  ?  ?  ?  ?  ?  ?   ? ? ? ?Pertinent Vitals/Pain Pain Assessment ?Pain Assessment: No/denies pain  ? ? ? ? 11/28/21 0913  ?PT - End of Session  ?Equipment Utilized During Treatment Gait belt;Oxygen;Other (comment) ?(Rt UE sling)  ?Activity Tolerance Patient tolerated treatment well  ?Patient left in chair;with call bell/phone within reach;with chair alarm set  ?Nurse  Communication Mobility status  ?PT Assessment  ?PT Recommendation/Assessment Patient needs continued PT services  ?PT Visit Diagnosis Unsteadiness on feet (R26.81);Muscle weakness (generalized) (M62.81);Difficulty in walking, not elsewhere classified (R26.2)   ?PT Problem List Decreased strength;Decreased range of motion;Decreased activity tolerance;Decreased balance;Decreased mobility;Decreased knowledge of use of DME;Decreased safety awareness;Decreased knowledge of precautions;Cardiopulmonary status limiting activity  ?PT Plan  ?PT Frequency (ACUTE ONLY) Min 3X/week  ?PT Treatment/Interventions (ACUTE ONLY) DME instruction;Gait training;Stair training;Functional mobility training;Therapeutic activities;Therapeutic exercise;Balance training;Patient/family education  ?AM-PAC PT "6 Clicks" Mobility Outcome Measure (Version 2)  ?Help needed turning from your back to your side while in a flat bed without using bedrails? 3  ?Help needed moving from lying on your back to sitting on the side of a flat bed without using bedrails? 3  ?Help needed moving to and from a bed to a chair (including a wheelchair)? 3  ?Help needed standing up from a chair using your arms (e.g., wheelchair or bedside chair)? 3  ?Help needed to walk in hospital room? 3  ?Help needed climbing 3-5 steps with a railing?  3  ?6 Click Score 18  ?Consider Recommendation of Discharge To: Home with HH  ?Progressive Mobility  ?What is the highest level of mobility based on the progressive mobility assessment? Level 5 (Walks with assist in room/hall) - Balance while stepping forward/back and can walk in room with assist - Complete  ?Activity Ambulated with assistance in hallway  ?PT Recommendation  ?Follow Up Recommendations Follow physician's recommendations for discharge plan and follow up therapies  ?Assistance recommended at discharge Frequent or constant Supervision/Assistance  ?Patient can return home with the following A little help with walking and/or transfers;A little help with bathing/dressing/bathroom;Help with stairs or ramp for entrance;Assist for transportation;Assistance with cooking/housework  ?Functional Status Assessment Patient has had a recent decline in their functional status and  demonstrates the ability to make significant improvements in function in a reasonable and predictable amount of time.  ?PT equipment None recommended by PT  ?Individuals Consulted  ?Consulted and Agree with Results and Recommendations Patient  ?Acute Rehab PT Goals  ?Patient Stated Goal get back home and heal to get back to indpendence and playing with grandkids  ?PT Goal Formulation With patient  ?Time For Goal Achievement 12/05/21  ?Potential to Achieve Goals Good  ?PT Time Calculation  ?PT Start Time (ACUTE ONLY) 0911  ?PT Stop Time (ACUTE ONLY) [id number]  ?PT Time Calculation (min) (ACUTE ONLY) 31 min  ?PT General Charges  ?$$ ACUTE PT VISIT 1 Visit  ?PT Evaluation  ?$PT Eval Low Complexity 1 Low  ?PT Treatments  ?$Gait Training 8-22 mins  ? ? ? ?Gwynneth Albright PT, DPT ?Acute Rehabilitation Services ?Office [phone] ?Pager [phone] ? ?11/28/21 1:19 PM ? ?

## 2021-11-28 NOTE — Progress Notes (Signed)
Transition of Care (TOC) Screening Note ? ?Patient Details  ?Name: Destiny Ballard ?Date of Birth: 05-May-1951 ? ?Transition of Care (TOC) CM/SW Contact:    ?Sherie Don, LCSW ?Phone Number: ?11/28/2021, 1:32 PM ? ?Transition of Care Department [facility]) has reviewed patient and no TOC needs have been identified at this time. We will continue to monitor patient advancement through interdisciplinary progression rounds. If new patient transition needs arise, please place a TOC consult. ?

## 2021-11-28 NOTE — Plan of Care (Signed)
  Problem: Education: Goal: Knowledge of General Education information will improve Description Including pain rating scale, medication(s)/side effects and non-pharmacologic comfort measures Outcome: Progressing   Problem: Health Behavior/Discharge Planning: Goal: Ability to manage health-related needs will improve Outcome: Progressing   

## 2021-11-28 NOTE — Progress Notes (Signed)
SATURATION QUALIFICATIONS: (This note is used to comply with regulatory documentation for home oxygen) ? ?Patient Saturations on Room Air at Rest = 90% ? ?Patient Saturations on Room Air while Ambulating = 79% ? ?Patient Saturations on 2 Liters of oxygen while Ambulating = 94% ? ?Please briefly explain why patient needs home oxygen: Pt continues to require 2L/min to maintain sufficient SpO2 level for mobility and currently would require supplemental O2 at home for safety with mobility. ? ? ?Gwynneth Albright PT, DPT ?Acute Rehabilitation Services ?Office [phone] ?Pager [phone] ? ?11/28/21 1:09 PM ? ?

## 2021-11-28 NOTE — Progress Notes (Signed)
Inpatient Diabetes Program Recommendations ? ?AACE/ADA: New Consensus Statement on Inpatient Glycemic Control  ? ?Target Ranges:  Prepandial:   less than 140 mg/dL ?     Peak postprandial:   less than 180 mg/dL (1-2 hours) ?     Critically ill patients:  140 - 180 mg/dL  ? ? Latest Reference Range & Units 11/28/21 03:25 11/28/21 ?8:44  ?Glucose 70 - 99 mg/dL 228 (H)  ?Farxiga 10 mg '@850'$  ? ?Metformin 1000 mg '@8'$ :44 ?  ? ? Latest Reference Range & Units 11/27/21 11:36 11/27/21 ?14:01 11/27/21 15:53  ?Glucose-Capillary 70 - 99 mg/dL 113 (H)  ? ?Decadron 8 mg 156 (H)  ? ? ?Review of Glycemic Control ? ?Diabetes history: DM2 ?Outpatient Diabetes medications: Basaglar 30 units daily, Trulicity 3 mg Qweek, Farxiga 10 mg daily, Metformin XR 1000 mg BID ?Current orders for Inpatient glycemic control: Semglee 30 units daily, Farxiga 10 mg daily, Metformin XR 1000 mg BID ? ?Inpatient Diabetes Program Recommendations:   ? ?Insulin: Please consider ordering CBGs AC&HS with Novolog 0-15 units TID with meals and Novolog 0-5 units QHS. ? ?Thanks, ?Barnie Alderman, RN, MSN, CDE ?Diabetes Coordinator ?Inpatient Diabetes Program ?[phone] (Team Pager from 8am to 5pm) ? ? ? ?

## 2021-11-28 NOTE — Consult Note (Signed)
Initial Consultation Note ? ? ?Patient: Destiny Ballard DOB: [birth year] PCP: Darreld Mclean, MD ?DOA: 11/27/2021 ?DOS: the patient was seen and examined on 11/28/2021 ?Primary service: Marchia Bond, MD ? ?Referring physician: Dr. Mardelle Matte ?Reason for consult: Hypoxia ? ?Assessment/Plan: Acute hypoxic respiratory failure due to fluid overload.  Patient takes Lasix at home.  Chest x-ray shows mild pleural effusion. ?Lasix 20 mg IV x1 dose today and I will reassess in AM. ?She did not have oxygen prior to admission. ? ?TRH will continue to follow the patient. ? ?HPI: ROBINA Ballard is a 71 y.o. female with past medical history of diastolic heart failure, anxiety, COPD, depression, type 2 diabetes status post right communicated intra-articular distal radius fracture with displacement and status post ORIF of the same.  Patient received IV fluids as well as block prior to surgery.  She denies chest pain cough fever nausea vomiting diarrhea.  She has dyspnea on exertion.  She was hypoxic at 79% with ambulation and 90% at rest.  She is not on oxygen prior to admission. ? ?Review of Systems: As mentioned in the history of present illness. All other systems reviewed and are negative. ?Past Medical History:  ?Diagnosis Date  ? Anxiety   ? Prior suicide attempt  ? CAD (coronary artery disease)   ? a) s/p DES to LAD 07/2005 b) Last Myoview low risk 11/2011 showing small fixed apical perfusion defect (prior MI vs attenuation) but no ischemia - normal EF.  ? Cervical spondylosis   ? Chest pain 12/10/2011  ? Chronic diastolic CHF (congestive heart failure) ([place]) 09/18/2017  ? Chronic eczematous otitis externa of both ears 11/18/2019  ? Chronic rhinitis 08/07/2016  ? Complication of anesthesia   ? developed pneumonia after nerve block was hospitalized  ? Constipation 12/18/2020  ? COPD with acute exacerbation ([place]) 04/25/2016  ? Coronary atherosclerosis 06/28/2008  ? Depression   ? Depression with anxiety   ?  Diabetes mellitus ([place]) 09/08/2017  ? Diabetic neuropathy ([place])   ? Dyspnea on exertion 09/02/2012  ? CXR 07/2012:  No acute process.    ? Eustachian tube dysfunction, bilateral 11/18/2019  ? GERD 06/28/2008  ? Hearing loss   ? Left  ? History of colonic polyps 12/18/2020  ? History of kidney stones   ? HLD (hyperlipidemia)   ? Hyperlipidemia associated with type 2 diabetes mellitus ([place]) 08/21/2020  ? Hypertension   ? Hypertension associated with diabetes ([place]) 01/30/2016  ? Hypokalemia 01/27/2016  ? Insulin resistance   ? Iron deficiency anemia   ? Leukocytosis   ? history of mild leukocytosis  ? Lobar pneumonia, unspecified organism ([place]) 11/13/2017  ? Melena 12/18/2020  ? 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  ? Obesity (BMI 30-39.9) 08/24/2018  ? Last Assessment & Plan:  Exercise and weight reduction is encouraged.  ? OSA (obstructive sleep apnea) 05/06/2014  ? Pneumonia due to COVID-19 virus 08/21/2020  ? Precordial chest pain   ? Sciatic nerve disease, left   ? leg  ? Sepsis ([place]) 09/18/2017  ? 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 Tinnitus of both ears 11/18/2019  ? Type 2 diabetes mellitus with hyperglycemia, with long-term current use of insulin ([place]) 12/10/2019  ? Uncontrolled type 2 diabetes mellitus with complication, with long-term current use of insulin 03/14/2020  ? ?Past Surgical History:  ?Procedure Laterality Date  ? BREAST ENHANCEMENT SURGERY    ? CARDIAC CATHETERIZATION  06/17/2007  ? NORMAL. EF 60%  ?  CARDIAC CATHETERIZATION N/A 01/29/2016  ? Procedure: Left Heart Cath and Coronary Angiography;  Surgeon: Sherren Mocha, MD;  Location: [facility];  Service: Cardiovascular;  Laterality: N/A;  ? CERVICAL SPONDYLOSIS    ? SINGLE LEVEL FUSION  ? CHILDBIRTH    ? X3  ? COLONOSCOPY  2010  ? normal  ? COLONOSCOPY WITH PROPOFOL  10/01/2021  ? CORONARY STENT PLACEMENT  07/15/2005  ? LEFT ANTERIOR DESCENDING  ? FOREARM FRACTURE SURGERY  07/15/2008  ? hand  and shoulder   ? INCISION AND DRAINAGE BREAST ABSCESS  01/05/2012  ?    ? INCISION AND DRAINAGE PERIRECTAL ABSCESS N/A 02/18/2014  ? Procedure: IRRIGATION AND DEBRIDEMENT PERIRECTAL ABSCESS;  Surgeon: Pedro Earls, MD;  Location: [facility];  Service: General;  Laterality: N/A;  ? LEFT HEART CATH AND CORONARY ANGIOGRAPHY N/A 10/10/2017  ? Procedure: LEFT HEART CATH AND CORONARY ANGIOGRAPHY;  Surgeon: Burnell Blanks, MD;  Location: [facility];  Service: Cardiovascular;  Laterality: N/A;  ? LUMBAR LAMINECTOMY    ? ROTATOR CUFF REPAIR    ? bilaterla  ? TONSILLECTOMY AND ADENOIDECTOMY    ? TUBAL LIGATION    ? VIDEO BRONCHOSCOPY Bilateral 08/13/2016  ? Procedure: VIDEO BRONCHOSCOPY WITHOUT FLUORO;  Surgeon: Collene Gobble, MD;  Location: [facility];  Service: Cardiopulmonary;  Laterality: Bilateral;  ? ?Social History:  reports that she has never smoked. She has been exposed to tobacco smoke. She has never used smokeless tobacco. She reports current alcohol use. She reports that she does not use drugs. ? ?Allergies  ?Allergen Reactions  ? Prednisone Other (See Comments)  ?  REACTION: mood swings, nightmares. "Shot doesn't bother me, reaction is just with the pill" she states she has had the steroid injections before. From our records methylprednisone was given to her in 2013 without any complications.  ? ? ?Family History  ?Problem Relation Age of Onset  ? Heart attack Mother   ? Diabetes Mother   ? Lung cancer Mother   ? Asthma Mother   ? Heart disease Mother   ? Suicidality Father   ?     "killed himself"  ? Diabetes Sister   ? Cancer Sister   ? Colon cancer Sister 39  ? Asthma Daughter   ?     x 2  ? Cancer Daughter   ?     pre-cancerous polyp  ? Cervical cancer Daughter   ?     cervical   ? Allergies Other   ?     all family--seasonal allergies  ? ? ?Prior to Admission medications   ?Medication Sig Start Date End Date Taking? Authorizing Provider  ?albuterol (VENTOLIN HFA) 108 (90 Base) MCG/ACT  inhaler INHALE 2 PUFFS INTO THE LUNGS EVERY EIGHT HOURS AS NEEDED FOR WHEEZING OR SHORTNESS OF BREATH. 08/23/20 08/15/22 Yes Ghimire, Henreitta Leber, MD  ?aspirin EC 81 MG tablet Take 81 mg by mouth daily.   Yes [provider]  ?calcium carbonate (TUMS EX) 750 MG chewable tablet Chew 1 tablet by mouth daily as needed for heartburn.   Yes [provider]  ?Cholecalciferol (VITAMIN D3) 1000 UNITS CAPS Take 1,000 Units by mouth daily.   Yes [provider]  ?dapagliflozin propanediol (FARXIGA) 10 MG TABS tablet Take 1 tablet (10 mg total) by mouth daily. 04/30/21  Yes Shamleffer, Melanie Crazier, MD  ?DIPHENHYDRAMINE HCL PO Take 25 mg by mouth every 6 (six) hours as needed for sleep or allergies.   Yes  [provider]  ?Dulaglutide (TRULICITY) 3 AT/5.5DD SOPN Inject 3 mg into the skin once a week.   Yes [provider]  ?fexofenadine (ALLEGRA) 180 MG tablet Take 180 mg by mouth daily.   Yes [provider]  ?FLUoxetine (PROZAC) 40 MG capsule TAKE 1 CAPSULE BY MOUTH EVERY DAY 05/28/21  Yes Copland, Gay Filler, MD  ?gabapentin (NEURONTIN) 300 MG capsule TAKE 2 CAPSULES BY MOUTH TWICE A DAY 10/01/21  Yes Copland, Gay Filler, MD  ?hydrALAZINE (APRESOLINE) 25 MG tablet Take 2 tablets (50 mg total) by mouth 2 (two) times daily. For blood pressure 08/30/21  Yes Copland, Gay Filler, MD  ?Insulin Glargine (BASAGLAR KWIKPEN) 100 UNIT/ML Inject 30 Units into the skin daily. Profile until patient requests 08/30/21  Yes Copland, Gay Filler, MD  ?Insulin Pen Needle 32G X 4 MM MISC 1 Device by Does not apply route daily. 09/05/20  Yes Shamleffer, Melanie Crazier, MD  ?loperamide (IMODIUM A-D) 2 MG tablet Take 1 tablet (2 mg total) by mouth 2 (two) times daily as needed for diarrhea or loose stools. ?Patient not taking: Reported on 11/27/2021 10/01/21  Yes Ladene Artist, MD  ?LORazepam (ATIVAN) 0.5 MG tablet Take 1 tablet (0.5 mg total) by mouth 2 (two) times daily as needed for anxiety. 02/27/21   Yes Roma Schanz R, DO  ?metFORMIN (GLUCOPHAGE-XR) 500 MG 24 hr tablet TAKE 2 TABLETS BY MOUTH TWICE A DAY 08/16/21  Yes Copland, Gay Filler, MD  ?metoprolol succinate (TOPROL-XL) 100 MG 24 hr tablet TAKE

## 2021-11-29 DIAGNOSIS — I2510 Atherosclerotic heart disease of native coronary artery without angina pectoris: Secondary | ICD-10-CM | POA: Diagnosis not present

## 2021-11-29 DIAGNOSIS — J449 Chronic obstructive pulmonary disease, unspecified: Secondary | ICD-10-CM | POA: Diagnosis not present

## 2021-11-29 DIAGNOSIS — S52571A Other intraarticular fracture of lower end of right radius, initial encounter for closed fracture: Secondary | ICD-10-CM | POA: Diagnosis not present

## 2021-11-29 DIAGNOSIS — I5032 Chronic diastolic (congestive) heart failure: Secondary | ICD-10-CM | POA: Diagnosis not present

## 2021-11-29 DIAGNOSIS — R0902 Hypoxemia: Secondary | ICD-10-CM | POA: Diagnosis not present

## 2021-11-29 DIAGNOSIS — E1140 Type 2 diabetes mellitus with diabetic neuropathy, unspecified: Secondary | ICD-10-CM | POA: Diagnosis not present

## 2021-11-29 DIAGNOSIS — I110 Hypertensive heart disease with heart failure: Secondary | ICD-10-CM | POA: Diagnosis not present

## 2021-11-29 LAB — MAGNESIUM: Magnesium: 2.1 mg/dL (ref 1.7–2.4)

## 2021-11-29 LAB — GLUCOSE, CAPILLARY
Glucose-Capillary: 127 mg/dL — ABNORMAL HIGH (ref 70–99)
Glucose-Capillary: 136 mg/dL — ABNORMAL HIGH (ref 70–99)

## 2021-11-29 LAB — CBC
HCT: 38.2 % (ref 36.0–46.0)
Hemoglobin: 11.5 g/dL — ABNORMAL LOW (ref 12.0–15.0)
MCH: 25.4 pg — ABNORMAL LOW (ref 26.0–34.0)
MCHC: 30.1 g/dL (ref 30.0–36.0)
MCV: 84.5 fL (ref 80.0–100.0)
Platelets: 237 10*3/uL (ref 150–400)
RBC: 4.52 MIL/uL (ref 3.87–5.11)
RDW: 14.2 % (ref 11.5–15.5)
WBC: 14.3 10*3/uL — ABNORMAL HIGH (ref 4.0–10.5)
nRBC: 0 % (ref 0.0–0.2)

## 2021-11-29 LAB — BASIC METABOLIC PANEL
Anion gap: 7 (ref 5–15)
BUN: 19 mg/dL (ref 8–23)
CO2: 31 mmol/L (ref 22–32)
Calcium: 8.9 mg/dL (ref 8.9–10.3)
Chloride: 102 mmol/L (ref 98–111)
Creatinine, Ser: 0.56 mg/dL (ref 0.44–1.00)
GFR, Estimated: >60 mL/min (ref >60)
Glucose, Bld: 151 mg/dL — ABNORMAL HIGH (ref 70–99)
Potassium: 4 mmol/L (ref 3.5–5.1)
Sodium: 140 mmol/L (ref 135–145)

## 2021-11-29 MED ORDER — ACETAMINOPHEN 325 MG PO TABS: 325.0000 mg | ORAL_TABLET | Freq: Four times a day (QID) | ORAL | Status: DC | PRN

## 2021-11-29 MED ORDER — VALSARTAN 160 MG PO TABS: 80.0000 mg | ORAL_TABLET | Freq: Every day | ORAL | 3 refills | Status: DC

## 2021-11-29 MED ORDER — HYDRALAZINE HCL 25 MG PO TABS: 25.0000 mg | ORAL_TABLET | Freq: Two times a day (BID) | ORAL | 1 refills | Status: DC

## 2021-11-29 MED ORDER — BISACODYL 5 MG PO TBEC: 5.0000 mg | DELAYED_RELEASE_TABLET | Freq: Every day | ORAL | 0 refills | Status: DC | PRN

## 2021-11-29 NOTE — Progress Notes (Signed)
Physical Therapy Treatment Patient Details Name: Destiny Ballard MRN: [id number] DOB: 21-Dec-1950 Today's Date: 11/29/2021   History of Present Illness Patient is 71 y.o. female s/p ORIF for Rt comminuted intra-articular distal radius fracture with displacement. Injury occured due to a fall on 11/21/2021 while playing with her grandson kicking a ball.    PT Comments    Patient making good progress with mobility. She is steady with gait and no device. Continues to desat with mobility (see ambulatory pulm note). Pt demonstrated ability to don lower body clothing with Lt UE and required min assist for donning shirt with cues to dress Rt UE first. Reviewed exercises for Rt UE and precautions. Encouraged pt to continue to attempt mobility with RN/NT staff and continue to attempt to wean O2. Pt resting on RA at 94% at EOS.    Recommendations for follow up therapy are one component of a multi-disciplinary discharge planning process, led by the attending physician.  Recommendations may be updated based on patient status, additional functional criteria and insurance authorization.  Follow Up Recommendations  Follow physician's recommendations for discharge plan and follow up therapies     Assistance Recommended at Discharge Frequent or constant Supervision/Assistance  Patient can return home with the following A little help with walking and/or transfers;A little help with bathing/dressing/bathroom;Help with stairs or ramp for entrance;Assist for transportation;Assistance with cooking/housework   Equipment Recommendations  None recommended by PT    Recommendations for Other Services       Precautions / Restrictions Precautions Precautions: Fall Precaution Comments: pt reports this is the only fall she has had in 6 months Restrictions Weight Bearing Restrictions: Yes RUE Weight Bearing: Non weight bearing     Mobility  Bed Mobility               General bed mobility comments: OOB  in recliner    Transfers Overall transfer level: Needs assistance Equipment used: None Transfers: Sit to/from Stand Sit to Stand: Min guard, Supervision           General transfer comment: guard/supervision, pt able to power up with Lt UE from recliner. steady once standing.    Ambulation/Gait Ambulation/Gait assistance: Min guard Gait Distance (Feet): 400 Feet Assistive device: Rolling walker (2 wheels) Gait Pattern/deviations: Step-through pattern, Decreased stride length, Shuffle, Drifts right/left Gait velocity: decr     General Gait Details: slow gait and mildly unsteady with WBOS and shuffled steps. however no overt LOB thorughout gait. pt desats on RA and required 2L/min for sufficient SpO2.   Stairs             Wheelchair Mobility    Modified Rankin (Stroke Patients Only)       Balance Overall balance assessment: Needs assistance, Mild deficits observed, not formally tested Sitting-balance support: Feet supported Sitting balance-Leahy Scale: Good Sitting balance - Comments: pt able to don socks/shoes in sitting and don pants in sitting then pull up in standing with min guard, no assist. Min assist needed to don top and button up shirt. cues to put sleeve on Rt UE first.   Standing balance support: During functional activity, No upper extremity supported Standing balance-Leahy Scale: Good                              Cognition Arousal/Alertness: Awake/alert Behavior During Therapy: WFL for tasks assessed/performed Overall Cognitive Status: Within Functional Limits for tasks assessed  Exercises General Exercises - Upper Extremity Shoulder Flexion: AROM, 10 reps, Right Shoulder ABduction: AROM, Right, 10 reps Elbow Flexion: AROM, Right, Other reps (comment) (8) Elbow Extension: AROM, Right, Other reps (comment) (8)    General Comments        Pertinent Vitals/Pain Pain  Assessment Pain Assessment: 0-10 Pain Score: 5  Pain Location: Rt wrist and forearm Pain Descriptors / Indicators: Aching, Discomfort, Sore Pain Intervention(s): Limited activity within patient's tolerance, Monitored during session, Repositioned, Ice applied, Premedicated before session    Home Living                          Prior Function            PT Goals (current goals can now be found in the care plan section) Acute Rehab PT Goals Patient Stated Goal: get back home and heal to get back to indpendence and playing with grandkids PT Goal Formulation: With patient Time For Goal Achievement: 12/05/21 Potential to Achieve Goals: Good Progress towards PT goals: Progressing toward goals    Frequency    Min 3X/week      PT Plan Current plan remains appropriate    Co-evaluation              AM-PAC PT "6 Clicks" Mobility   Outcome Measure  Help needed turning from your back to your side while in a flat bed without using bedrails?: A Little Help needed moving from lying on your back to sitting on the side of a flat bed without using bedrails?: A Little Help needed moving to and from a bed to a chair (including a wheelchair)?: A Little Help needed standing up from a chair using your arms (e.g., wheelchair or bedside chair)?: A Little Help needed to walk in hospital room?: A Little Help needed climbing 3-5 steps with a railing? : A Little 6 Click Score: 18    End of Session Equipment Utilized During Treatment: Gait belt;Oxygen;Other (comment) (Rt UE sling) Activity Tolerance: Patient tolerated treatment well Patient left: in chair;with call bell/phone within reach;with chair alarm set Nurse Communication: Mobility status PT Visit Diagnosis: Unsteadiness on feet (R26.81);Muscle weakness (generalized) (M62.81);Difficulty in walking, not elsewhere classified (R26.2)     Time: 1324-4010 PT Time Calculation (min) (ACUTE ONLY): 33 min  Charges:   $Therapeutic Exercise: 8-22 mins $Therapeutic Activity: 8-22 mins                     Verner Mould, DPT Acute Rehabilitation Services Office [phone] Pager [phone]  11/29/21 12:45 PM    Jacques Navy 11/29/2021, 12:45 PM

## 2021-11-29 NOTE — Progress Notes (Signed)
PROGRESS NOTE    Destiny Ballard  PYK:[id number] DOB: August 02, 1950 DOA: 11/27/2021 PCP: Darreld Mclean, MD  Brief Narrative:71 y.o. female with past medical history of diastolic heart failure, anxiety, COPD, depression, type 2 diabetes status post right communicated intra-articular distal radius fracture with displacement and status post ORIF of the same.  Patient received IV fluids as well as block prior to surgery.  She denies chest pain cough fever nausea vomiting diarrhea.  She has dyspnea on exertion.  She was hypoxic at 79% with ambulation and 90% at rest.  She is not on oxygen prior to admission.  Assessment & Plan:   Principal Problem:   Hypoxia   #1 acute hypoxic respiratory failure due to fluid overload continue Lasix 20 mg daily on discharge DC home on 2 L of oxygen Encouraged to use incentive spirometer at home  #2 hypertension her blood pressure is running soft I have made changes to her antihypertensives on discharge in the AVS  #3 grade 1 diastolic dysfunction continue Lasix ACE inhibitor.   Estimated body mass index is 33.33 kg/m as calculated from the following:   Height as of this encounter: '4\' 11"'$  (1.499 m).   Weight as of this encounter: 74.8 kg.   Subjective: Sitting up in chair feels better than yesterday breathing easier than yesterday decreased swelling  Objective: Vitals:   11/28/21 1431 11/28/21 2105 11/29/21 0529 11/29/21 0849  BP: (!) 119/59 (!) 131/57 (!) 137/53 (!) 112/49  Pulse: 73 67 71 (!) 56  Resp: '20 16 14   '$ Temp: 97.9 F (36.6 C) 98 F (36.7 C) 97.9 F (36.6 C)   TempSrc:  Oral Axillary   SpO2: 97% 97% 97%   Weight:      Height:        Intake/Output Summary (Last 24 hours) at 11/29/2021 1007 Last data filed at 11/29/2021 0600 Gross per 24 hour  Intake 1290 ml  Output --  Net 1290 ml   Filed Weights   11/23/21 1504 11/27/21 1136  Weight: 74.8 kg 74.8 kg    Examination:  General exam: Appears calm and comfortable   Respiratory system: Clear to auscultation. Respiratory effort normal. Cardiovascular system: S1 & S2 heard, RRR. No JVD, murmurs, rubs, gallops or clicks. Gastrointestinal system: Abdomen is nondistended, soft and nontender. No organomegaly or masses felt. Normal bowel sounds heard. Central nervous system: Alert and oriented. No focal neurological deficits. Extremities: Trace bilateral pitting edema  skin: No rashes, lesions or ulcers Psychiatry: Judgement and insight appear normal. Mood & affect appropriate.     Data Reviewed: I have personally reviewed following labs and imaging studies  CBC: Recent Labs  Lab 11/27/21 1137 11/28/21 0325 11/29/21 0645  WBC 9.1 9.9 14.3*  HGB 11.6* 11.4* 11.5*  HCT 37.9 38.6 38.2  MCV 82.8 85.6 84.5  PLT 242 224 539   Basic Metabolic Panel: Recent Labs  Lab 11/27/21 1137 11/28/21 0325 11/29/21 0312  NA 136 139 140  K 3.3* 4.5 4.0  CL 98 104 102  CO2 '29 27 31  '$ GLUCOSE 125* 228* 151*  BUN '10 12 19  '$ CREATININE 0.56 0.58 0.56  CALCIUM 9.1 8.8* 8.9  MG  --   --  2.1   GFR: Estimated Creatinine Clearance: 56 mL/min (by C-G formula based on SCr of 0.56 mg/dL). Liver Function Tests: No results for input(s): AST, ALT, ALKPHOS, BILITOT, PROT, ALBUMIN in the last 168 hours. No results for input(s): LIPASE, AMYLASE in the last 168 hours. No results  for input(s): AMMONIA in the last 168 hours. Coagulation Profile: No results for input(s): INR, PROTIME in the last 168 hours. Cardiac Enzymes: No results for input(s): CKTOTAL, CKMB, CKMBINDEX, TROPONINI in the last 168 hours. BNP (last 3 results) No results for input(s): PROBNP in the last 8760 hours. HbA1C: Recent Labs    11/27/21 1137  HGBA1C 7.0*   CBG: Recent Labs  Lab 11/27/21 1553 11/28/21 1135 11/28/21 1624 11/28/21 2148 11/29/21 0728  GLUCAP 156* 224* 199* 159* 127*   Lipid Profile: No results for input(s): CHOL, HDL, LDLCALC, TRIG, CHOLHDL, LDLDIRECT in the last 72  hours. Thyroid Function Tests: No results for input(s): TSH, T4TOTAL, FREET4, T3FREE, THYROIDAB in the last 72 hours. Anemia Panel: No results for input(s): VITAMINB12, FOLATE, FERRITIN, TIBC, IRON, RETICCTPCT in the last 72 hours. Sepsis Labs: No results for input(s): PROCALCITON, LATICACIDVEN in the last 168 hours.  No results found for this or any previous visit (from the past 240 hour(s)).       Radiology Studies: DG Chest 1 View  Result Date: 11/28/2021 CLINICAL DATA:  Dyspnea on exertion. EXAM: CHEST  1 VIEW COMPARISON:  11/18/2020 FINDINGS: The cardiac silhouette, mediastinal and hilar contours are within normal limits and stable. Minimal streaky bibasilar atelectasis but no infiltrates. Possible small left pleural effusion. The bony thorax is intact. IMPRESSION: Minimal streaky bibasilar atelectasis. Possible small left pleural effusion. Electronically Signed   By: Marijo Sanes M.D.   On: 11/28/2021 15:11        Scheduled Meds:  acetaminophen  1,000 mg Oral Q6H   aspirin EC  81 mg Oral Daily   dapagliflozin propanediol  10 mg Oral Daily   docusate sodium  100 mg Oral BID   FLUoxetine  40 mg Oral Daily   gabapentin  600 mg Oral BID   hydrALAZINE  50 mg Oral BID   insulin aspart  0-15 Units Subcutaneous TID WC   insulin aspart  0-5 Units Subcutaneous QHS   insulin glargine-yfgn  30 Units Subcutaneous Daily   irbesartan  150 mg Oral Daily   loratadine  10 mg Oral Daily   metFORMIN  1,000 mg Oral BID WC   metoprolol succinate  100 mg Oral Daily   montelukast  10 mg Oral QHS   rosuvastatin  20 mg Oral Daily   umeclidinium bromide  1 puff Inhalation Daily   Continuous Infusions:   LOS: 0 days    Time spent: 26 min  Georgette Shell, MD 11/29/2021, 10:07 AM

## 2021-11-29 NOTE — Plan of Care (Signed)
?  Problem: Activity: ?Goal: Risk for activity intolerance will decrease ?Outcome: Progressing ?  ?Problem: Safety: ?Goal: Ability to remain free from injury will improve ?Outcome: Progressing ?  ?Problem: Pain Managment: ?Goal: General experience of comfort will improve ?Outcome: Progressing ?  ?

## 2021-11-29 NOTE — Discharge Summary (Signed)
Discharge Summary  Patient ID: Destiny Ballard MRN: [id number] DOB/AGE: [birth year] 71 y.o.  Admit date: 11/27/2021 Discharge date: 11/29/2021  Admission Diagnoses:  Hypoxia  Discharge Diagnoses:  Principal Problem:   Hypoxia   Past Medical History:  Diagnosis Date   Anxiety    Prior suicide attempt   CAD (coronary artery disease)    a) s/p DES to LAD 07/2005 b) Last Myoview low risk 11/2011 showing small fixed apical perfusion defect (prior MI vs attenuation) but no ischemia - normal EF.   Cervical spondylosis    Chest pain 12/10/2011   Chronic diastolic CHF (congestive heart failure) (HCC) 09/18/2017   Chronic eczematous otitis externa of both ears 11/18/2019   Chronic rhinitis 52/84/1324   Complication of anesthesia    developed pneumonia after nerve block was hospitalized   Constipation 12/18/2020   COPD with acute exacerbation ([place]) 04/25/2016   Coronary atherosclerosis 06/28/2008   Depression    Depression with anxiety    Diabetes mellitus ([place]) 09/08/2017   Diabetic neuropathy ([place])    Dyspnea on exertion 09/02/2012   CXR 07/2012:  No acute process.     Eustachian tube dysfunction, bilateral 11/18/2019   GERD 06/28/2008   Hearing loss    Left   History of colonic polyps 12/18/2020   History of kidney stones    HLD (hyperlipidemia)    Hyperlipidemia associated with type 2 diabetes mellitus ([place]) 08/21/2020   Hypertension    Hypertension associated with diabetes ([place]) 01/30/2016   Hypokalemia 01/27/2016   Insulin resistance    Iron deficiency anemia    Leukocytosis    history of mild leukocytosis   Lobar pneumonia, unspecified organism ([place]) 11/13/2017   Melena 12/18/2020   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   Obesity (BMI 30-39.9) 08/24/2018   Last Assessment & Plan:  Exercise and weight reduction is encouraged.   OSA (obstructive sleep apnea) 05/06/2014   Pneumonia due to COVID-19 virus 08/21/2020   Precordial chest  pain    Sciatic nerve disease, left    leg   Sepsis ([place]) 09/18/2017   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   Tinnitus of both ears 11/18/2019   Type 2 diabetes mellitus with hyperglycemia, with long-term current use of insulin ([place]) 12/10/2019   Uncontrolled type 2 diabetes mellitus with complication, with long-term current use of insulin 03/14/2020    Surgeries: Procedure(s): OPEN REDUCTION INTERNAL FIXATION (ORIF) WRIST FRACTURE on 11/27/2021   Consultants (if any): Treatment Team:  Joycelyn Das, MD Georgette Shell, MD  Discharged Condition: Improved  Hospital Course: Destiny Ballard is an 71 y.o. female who was was brought to [facility] on 11/27/21 for an outpatient right distal radius ORIF for treatment of a right distal radius fracture. In PACU, patient was unable to be weaned from oxygen, anesthesia recommended she stay in the hospital until block begins to wear off and she can maintain her normal pulmonary status of baseline upper 80's-low 90's on room air. Medicine was consulted and recommended restarting patient's lasix. Patient worked with therapy 5/16 and 5/17 and was unable to maintain oxygen saturations above 79% while ambulating on room air. Patient will discharge home with 2L of oxygen with close follow-up with her PCP.    She was given perioperative antibiotics:  Anti-infectives (From admission, onward)    Start     Dose/Rate Route Frequency Ordered Stop   11/27/21 1115  ceFAZolin (ANCEF) IVPB 2g/100 mL premix  2 g 200 mL/hr over 30 Minutes Intravenous On call to O.R. 11/27/21 1107 11/27/21 1423     .  She was given sequential compression devices and early ambulation for DVT prophylaxis.  She benefited maximally from the hospital stay and there were no complications.    Recent vital signs:  Vitals:   11/29/21 0849 11/29/21 1354  BP: (!) 112/49 (!) 111/50  Pulse: (!) 56 78  Resp:  18  Temp:  97.7 F (36.5 C)  SpO2:  95%     Recent laboratory studies:  Lab Results  Component Value Date   HGB 11.5 (L) 11/29/2021   HGB 11.4 (L) 11/28/2021   HGB 11.6 (L) 11/27/2021   Lab Results  Component Value Date   WBC 14.3 (H) 11/29/2021   PLT 237 11/29/2021   Lab Results  Component Value Date   INR 0.9 10/06/2017   Lab Results  Component Value Date   NA 140 11/29/2021   K 4.0 11/29/2021   CL 102 11/29/2021   CO2 31 11/29/2021   BUN 19 11/29/2021   CREATININE 0.56 11/29/2021   GLUCOSE 151 (H) 11/29/2021    Discharge Medications:   Allergies as of 11/29/2021       Reactions   Prednisone Other (See Comments)   REACTION: mood swings, nightmares. "Shot doesn't bother me, reaction is just with the pill" she states she has had the steroid injections before. From our records methylprednisone was given to her in 2013 without any complications.        Medication List     STOP taking these medications    DIPHENHYDRAMINE HCL PO   metoprolol succinate 100 MG 24 hr tablet Commonly known as: TOPROL-XL       TAKE these medications    acetaminophen 325 MG tablet Commonly known as: TYLENOL Take 1-2 tablets (325-650 mg total) by mouth every 6 (six) hours as needed for mild pain (pain score 1-3 or temp > 100.5).   albuterol 108 (90 Base) MCG/ACT inhaler Commonly known as: VENTOLIN HFA INHALE 2 PUFFS INTO THE LUNGS EVERY EIGHT HOURS AS NEEDED FOR WHEEZING OR SHORTNESS OF BREATH.   aspirin EC 81 MG tablet Take 81 mg by mouth daily.   Basaglar KwikPen 100 UNIT/ML Inject 30 Units into the skin daily. Profile until patient requests   bisacodyl 5 MG EC tablet Commonly known as: DULCOLAX Take 1 tablet (5 mg total) by mouth daily as needed for moderate constipation.   calcium carbonate 750 MG chewable tablet Commonly known as: TUMS EX Chew 1 tablet by mouth daily as needed for heartburn.   dapagliflozin propanediol 10 MG Tabs tablet Commonly known as: Farxiga Take 1 tablet (10 mg total) by mouth  daily.   fexofenadine 180 MG tablet Commonly known as: ALLEGRA Take 180 mg by mouth daily.   FLUoxetine 40 MG capsule Commonly known as: PROZAC TAKE 1 CAPSULE BY MOUTH EVERY DAY   furosemide 20 MG tablet Commonly known as: LASIX TAKE 1 TABLET BY MOUTH EVERY DAY   gabapentin 300 MG capsule Commonly known as: NEURONTIN TAKE 2 CAPSULES BY MOUTH TWICE A DAY   hydrALAZINE 25 MG tablet Commonly known as: APRESOLINE Take 1 tablet (25 mg total) by mouth 2 (two) times daily. For blood pressure What changed: how much to take   Insulin Pen Needle 32G X 4 MM Misc 1 Device by Does not apply route daily.   loperamide 2 MG tablet Commonly known as: Imodium A-D Take 1 tablet (2 mg  total) by mouth 2 (two) times daily as needed for diarrhea or loose stools.   LORazepam 0.5 MG tablet Commonly known as: ATIVAN Take 1 tablet (0.5 mg total) by mouth 2 (two) times daily as needed for anxiety.   metFORMIN 500 MG 24 hr tablet Commonly known as: GLUCOPHAGE-XR TAKE 2 TABLETS BY MOUTH TWICE A DAY   montelukast 10 MG tablet Commonly known as: SINGULAIR Take 1 tablet (10 mg total) by mouth at bedtime. For allergies / asthma   nitroGLYCERIN 0.4 MG SL tablet Commonly known as: NITROSTAT Place 1 tablet (0.4 mg total) under the tongue every 5 (five) minutes as needed for chest pain.   ondansetron 4 MG tablet Commonly known as: Zofran Take 1 tablet (4 mg total) by mouth every 8 (eight) hours as needed for nausea or vomiting. What changed: reasons to take this   oxyCODONE 5 MG immediate release tablet Commonly known as: Roxicodone Take 1 tablet (5 mg total) by mouth every 4 (four) hours as needed for severe pain.   rosuvastatin 20 MG tablet Commonly known as: CRESTOR Take 1 tablet (20 mg total) by mouth daily. For cholesterol / heart   Spiriva Respimat 2.5 MCG/ACT Aers Generic drug: Tiotropium Bromide Monohydrate Inhale 2 puffs into the lungs daily. What changed:  when to take  this reasons to take this   traZODone 50 MG tablet Commonly known as: DESYREL TAKE 1/2 TO 1 TABLET BY MOUTH AT BEDTIME AS NEEDED FOR SLEEP   Trulicity 3 EZ/6.6QH Sopn Generic drug: Dulaglutide Inject 3 mg into the skin once a week.   valsartan 160 MG tablet Commonly known as: DIOVAN Take 0.5 tablets (80 mg total) by mouth daily. What changed: how much to take   Vitamin D3 25 MCG (1000 UT) Caps Take 1,000 Units by mouth daily.   zinc gluconate 50 MG tablet Take 50 mg by mouth daily.               Durable Medical Equipment  (From admission, onward)           Start     Ordered   11/29/21 1442  For home use only DME oxygen  Once       Question Answer Comment  Length of Need 6 Months   Mode or (Route) Nasal cannula   Liters per Minute 2   Frequency Continuous (stationary and portable oxygen unit needed)   Oxygen delivery system Gas      11/29/21 1441            Diagnostic Studies: DG Chest 1 View  Result Date: 11/28/2021 CLINICAL DATA:  Dyspnea on exertion. EXAM: CHEST  1 VIEW COMPARISON:  11/18/2020 FINDINGS: The cardiac silhouette, mediastinal and hilar contours are within normal limits and stable. Minimal streaky bibasilar atelectasis but no infiltrates. Possible small left pleural effusion. The bony thorax is intact. IMPRESSION: Minimal streaky bibasilar atelectasis. Possible small left pleural effusion. Electronically Signed   By: Marijo Sanes M.D.   On: 11/28/2021 15:11   DG Wrist Complete Right  Result Date: 11/21/2021 CLINICAL DATA:  [place]. Right wrist pain. EXAM: RIGHT WRIST - COMPLETE 3+ VIEW COMPARISON:  None Available. FINDINGS: Displaced and dorsally impacted intra-articular fracture of the distal radius (Colles type fracture) associated ulnar styloid avulsion fracture. The carpal bones and metacarpal bones are intact. There is widening of the scapholunate joint space which could suggest a ligamentous injury. IMPRESSION: 1. Displaced intra-articular  distal radius fracture. Ulnar styloid avulsion fracture. 2. Widened scapholunate joint  space could suggest a ligamentous injury. Electronically Signed   By: Marijo Sanes M.D.   On: 11/21/2021 12:58   CT Lumbar Spine Wo Contrast  Result Date: 11/21/2021 CLINICAL DATA:  Fall with subsequent low back pain EXAM: CT LUMBAR SPINE WITHOUT CONTRAST TECHNIQUE: Multidetector CT imaging of the lumbar spine was performed without intravenous contrast administration. Multiplanar CT image reconstructions were also generated. RADIATION DOSE REDUCTION: This exam was performed according to the departmental dose-optimization program which includes automated exposure control, adjustment of the mA and/or kV according to patient size and/or use of iterative reconstruction technique. COMPARISON:  Radiography 09/09/2019.  MRI 11/07/2014. FINDINGS: Segmentation: 5 lumbar type vertebral bodies. Alignment: No malalignment. Vertebrae: Minor superior endplate fracture at L2, more towards the left side. Loss of height 10%. No retropulsed bone. No posterior element involvement. Paraspinal and other soft tissues: Negative Disc levels: Mild disc bulges and facet osteoarthritis. No compressive canal or foraminal narrowing. IMPRESSION: Minor acute superior endplate fracture at L2, more towards the left side, with loss of height of 10%. No retropulsed bone. Mild degenerative disc disease and degenerative facet disease without compressive stenosis. Electronically Signed   By: Nelson Chimes M.D.   On: 11/21/2021 14:18   DG Hip Unilat W or Wo Pelvis 2-3 Views Right  Result Date: 11/21/2021 CLINICAL DATA:  Right hip pain after fall today. EXAM: DG HIP (WITH OR WITHOUT PELVIS) 2-3V RIGHT COMPARISON:  January 12, 2019. FINDINGS: There is no evidence of hip fracture or dislocation. There is no evidence of arthropathy or other focal bone abnormality. IMPRESSION: Negative. Electronically Signed   By: Marijo Conception M.D.   On: 11/21/2021 12:59     Disposition: Discharge disposition: 01-Home or Self Care          Follow-up Information     Marchia Bond, MD. Schedule an appointment as soon as possible for a visit in 2 week(s).   Specialty: Orthopedic Surgery Contact information: [place] [id number] [phone]         Copland, Gay Filler, MD Follow up.   Specialty: Family Medicine Why: Check BMP and CBC in 1 week Contact information: [place] [address] [place] Follow up.   Why: [employer] will be providing your home oxygen.                 Signed: Ventura Bruns PA-C 11/29/2021, 3:34 PM

## 2021-11-29 NOTE — Addendum Note (Signed)
Addendum  created 11/29/21 1216 by Effie Berkshire, MD   Clinical Note Signed

## 2021-11-29 NOTE — TOC Transition Note (Signed)
Transition of Care [facility]) - CM/SW Discharge Note  Patient Details  Name: Destiny Ballard MRN: [id number] Date of Birth: [birth year]  Transition of [facility]) CM/SW Contact:  Sherie Don, LCSW Phone Number: 11/29/2021, 11:11 AM  Clinical Narrative: CSW notified patient will need home oxygen. CSW spoke with patient's daughter regarding home O2 and confirmed patient is agreeable to referral to [place]. CSW made O2 referral to [facility] with [employer]. [employer] to deliver O2 to patient's room. RN updated. TOC signing off.  Final next level of care: Home/Self Care Barriers to Discharge: No Barriers Identified  Patient Goals and CMS Choice Patient states their goals for this hospitalization and ongoing recovery are:: Discharge home CMS Medicare.gov Compare Post Acute Care list provided to:: Patient Choice offered to / list presented to : Patient  Discharge Plan and Services       DME Arranged: Oxygen DME Agency: [employer] Date DME Agency Contacted: 11/29/21 Time DME Agency Contacted: 1109 Representative spoke with at DME Agency: [place]  Readmission Risk Interventions     View : No data to display.

## 2021-11-29 NOTE — Progress Notes (Signed)
SATURATION QUALIFICATIONS: (This note is used to comply with regulatory documentation for home oxygen)  Patient Saturations on Room Air at Rest = 90%  Patient Saturations on Room Air while Ambulating = 79%  Patient Saturations on 2 Liters of oxygen at Rest = 95%  Patient Saturations on 2 Liters of oxygen while Ambulating = 91%  Please briefly explain why patient needs home oxygen: Pt continues to require 2L/min to maintain sufficient SpO2 level for mobility and currently would require supplemental O2 at home for safety with mobility. RN notified of pt's vitals, will continue to attempt to wean from O2.  Verner Mould, DPT Acute Rehabilitation Services Office [phone] Pager [phone]  11/29/21 9:13 AM

## 2021-11-29 NOTE — Progress Notes (Signed)
Subjective: 2 Days Post-Op s/p Procedure(s): OPEN REDUCTION INTERNAL FIXATION (ORIF) WRIST FRACTURE   Patient is alert, oriented. Sitting up in bed. Denies CP, SOB, nausea or vomiting. States feeling has come back in her hand and she is starting to get pain.   Objective:  PE: VITALS:   Vitals:   11/28/21 0850 11/28/21 1431 11/28/21 2105 11/29/21 0529  BP: 128/62 (!) 119/59 (!) 131/57 (!) 137/53  Pulse: 74 73 67 71  Resp:  '20 16 14  '$ Temp:  97.9 F (36.6 C) 98 F (36.7 C) 97.9 F (36.6 C)  TempSrc:   Oral Axillary  SpO2:  97% 97% 97%  Weight:      Height:       General: sitting up in bed, in no acute distress Resp: nasal canula in place, normal respiratory effort MSK: RUE in splint. Able to flex and extend all fingers. Sensation intact to all fingers. Capillary refill intact.   LABS  Results for orders placed or performed during the hospital encounter of 11/27/21 (from the past 24 hour(s))  Glucose, capillary     Status: Abnormal   Collection Time: 11/28/21 11:35 AM  Result Value Ref Range   Glucose-Capillary 224 (H) 70 - 99 mg/dL  Glucose, capillary     Status: Abnormal   Collection Time: 11/28/21  4:24 PM  Result Value Ref Range   Glucose-Capillary 199 (H) 70 - 99 mg/dL  Glucose, capillary     Status: Abnormal   Collection Time: 11/28/21  9:48 PM  Result Value Ref Range   Glucose-Capillary 159 (H) 70 - 99 mg/dL  Basic metabolic panel     Status: Abnormal   Collection Time: 11/29/21  3:12 AM  Result Value Ref Range   Sodium 140 135 - 145 mmol/L   Potassium 4.0 3.5 - 5.1 mmol/L   Chloride 102 98 - 111 mmol/L   CO2 31 22 - 32 mmol/L   Glucose, Bld 151 (H) 70 - 99 mg/dL   BUN 19 8 - 23 mg/dL   Creatinine, Ser 0.56 0.44 - 1.00 mg/dL   Calcium 8.9 8.9 - 10.3 mg/dL   GFR, Estimated >60 >60 mL/min   Anion gap 7 5 - 15  Magnesium     Status: None   Collection Time: 11/29/21  3:12 AM  Result Value Ref Range   Magnesium 2.1 1.7 - 2.4 mg/dL  CBC     Status:  Abnormal   Collection Time: 11/29/21  6:45 AM  Result Value Ref Range   WBC 14.3 (H) 4.0 - 10.5 K/uL   RBC 4.52 3.87 - 5.11 MIL/uL   Hemoglobin 11.5 (L) 12.0 - 15.0 g/dL   HCT 38.2 36.0 - 46.0 %   MCV 84.5 80.0 - 100.0 fL   MCH 25.4 (L) 26.0 - 34.0 pg   MCHC 30.1 30.0 - 36.0 g/dL   RDW 14.2 11.5 - 15.5 %   Platelets 237 150 - 400 K/uL   nRBC 0.0 0.0 - 0.2 %  Glucose, capillary     Status: Abnormal   Collection Time: 11/29/21  7:28 AM  Result Value Ref Range   Glucose-Capillary 127 (H) 70 - 99 mg/dL    DG Chest 1 View  Result Date: 11/28/2021 CLINICAL DATA:  Dyspnea on exertion. EXAM: CHEST  1 VIEW COMPARISON:  11/18/2020 FINDINGS: The cardiac silhouette, mediastinal and hilar contours are within normal limits and stable. Minimal streaky bibasilar atelectasis but no infiltrates. Possible small left pleural effusion. The bony  thorax is intact. IMPRESSION: Minimal streaky bibasilar atelectasis. Possible small left pleural effusion. Electronically Signed   By: Marijo Sanes M.D.   On: 11/28/2021 15:11    Assessment/Plan: Right Distal Radius fracture 2 Days Post-Op s/p Procedure(s): OPEN REDUCTION INTERNAL FIXATION (ORIF) WRIST FRACTURE - patient was unable to be weaned from oxygen in PACU, anesthesia recommended she stay in the hospital until block begins to wear off and she can maintain her normal pulmonary status of baseline upper 80's-low 90's on room air - consulted medicine regarding hypoxia, thank you for consult   Weightbearing: ok to weight bear through right shoulder and elbow if needed for ambulation with walker, no weight bearing through right wrist. Sling for comfort Insicional and dressing care: none, splint in place Pain control: continue current regimen Follow - up plan: 2 weeks with Dr. Mardelle Matte Dispo: plan to discharge home today with 2L of oxygen with close follow-up with PCP  Contact information:   Merlene Pulling, PA-C Weekdays 8-5  After hours and holidays please  check [email or web address] for group call information for [facility]  Ventura Bruns 11/29/2021, 7:56 AM

## 2021-11-30 ENCOUNTER — Encounter (HOSPITAL_COMMUNITY): Payer: Self-pay | Admitting: Orthopedic Surgery

## 2021-12-12 DIAGNOSIS — I110 Hypertensive heart disease with heart failure: Secondary | ICD-10-CM | POA: Diagnosis not present

## 2021-12-12 DIAGNOSIS — E1159 Type 2 diabetes mellitus with other circulatory complications: Secondary | ICD-10-CM | POA: Diagnosis not present

## 2021-12-12 DIAGNOSIS — Z794 Long term (current) use of insulin: Secondary | ICD-10-CM

## 2021-12-12 DIAGNOSIS — I2510 Atherosclerotic heart disease of native coronary artery without angina pectoris: Secondary | ICD-10-CM

## 2021-12-12 DIAGNOSIS — F32A Depression, unspecified: Secondary | ICD-10-CM

## 2021-12-12 DIAGNOSIS — S52591D Other fractures of lower end of right radius, subsequent encounter for closed fracture with routine healing: Secondary | ICD-10-CM | POA: Diagnosis not present

## 2021-12-12 DIAGNOSIS — I509 Heart failure, unspecified: Secondary | ICD-10-CM | POA: Diagnosis not present

## 2021-12-12 DIAGNOSIS — J449 Chronic obstructive pulmonary disease, unspecified: Secondary | ICD-10-CM

## 2021-12-12 DIAGNOSIS — E785 Hyperlipidemia, unspecified: Secondary | ICD-10-CM | POA: Diagnosis not present

## 2021-12-21 ENCOUNTER — Telehealth: Payer: Self-pay | Admitting: Family Medicine

## 2021-12-21 NOTE — Telephone Encounter (Signed)
Pt called stating she was having chest pains after being put on oxygen during her recent hospital visit. Offered to transfer to triage nurse for further eval. Pt refused transfer. Advised pt to visit ED if symptoms get worse.

## 2021-12-24 NOTE — Telephone Encounter (Signed)
I called pt back- she is doing ok, she is not having CP and states she does not need anything right now .  She had surgical repair of her wrist fracture and is now using oxygen

## 2021-12-24 NOTE — Telephone Encounter (Signed)
This was sent to me on my day off. Would you like to see the pt?

## 2021-12-26 DIAGNOSIS — S52591D Other fractures of lower end of right radius, subsequent encounter for closed fracture with routine healing: Secondary | ICD-10-CM | POA: Diagnosis not present

## 2022-01-03 ENCOUNTER — Ambulatory Visit (INDEPENDENT_AMBULATORY_CARE_PROVIDER_SITE_OTHER): Payer: Medicare Other

## 2022-01-03 DIAGNOSIS — I5032 Chronic diastolic (congestive) heart failure: Secondary | ICD-10-CM

## 2022-01-03 DIAGNOSIS — J449 Chronic obstructive pulmonary disease, unspecified: Secondary | ICD-10-CM

## 2022-01-03 DIAGNOSIS — E119 Type 2 diabetes mellitus without complications: Secondary | ICD-10-CM

## 2022-01-03 NOTE — Patient Instructions (Signed)
Visit Information  Thank you for allowing me to share the care management and care coordination services that are available to you as part of your health plan and services through your primary care provider and medical home. Please reach out to me at [phone] if the care management/care coordination team may be of assistance to you in the future.   Sanii Kukla, RN, MSN, BSN, CCM Care Management Coordinator [facility] [phone]  

## 2022-01-03 NOTE — Chronic Care Management (AMB) (Signed)
Chronic Care Management   CCM RN Visit Note  01/03/2022 Name: Destiny Ballard MRN: [id number] DOB: June 27, 1951  Subjective: Destiny Ballard is a 71 y.o. year old female who is a primary care patient of Copland, Gay Filler, MD. The care management team was consulted for assistance with disease management and care coordination needs.    Engaged with patient by telephone for follow up visit in response to provider referral for case management and/or care coordination services.   Consent to Services:  The patient was given information about Chronic Care Management services, agreed to services, and gave verbal consent prior to initiation of services.  Please see initial visit note for detailed documentation.   Patient agreed to services and verbal consent obtained.   Assessment: Review of patient past medical history, allergies, medications, health status, including review of consultants reports, laboratory and other test data, was performed as part of comprehensive evaluation and provision of chronic care management services.   SDOH (Social Determinants of Health) assessments and interventions performed:    CCM Care Plan  Allergies  Allergen Reactions   Prednisone Other (See Comments)    REACTION: mood swings, nightmares. "Shot doesn't bother me, reaction is just with the pill" she states she has had the steroid injections before. From our records methylprednisone was given to her in 2013 without any complications.    Outpatient Encounter Medications as of 01/03/2022  Medication Sig   acetaminophen (TYLENOL) 325 MG tablet Take 1-2 tablets (325-650 mg total) by mouth every 6 (six) hours as needed for mild pain (pain score 1-3 or temp > 100.5).   albuterol (VENTOLIN HFA) 108 (90 Base) MCG/ACT inhaler INHALE 2 PUFFS INTO THE LUNGS EVERY EIGHT HOURS AS NEEDED FOR WHEEZING OR SHORTNESS OF BREATH.   aspirin EC 81 MG tablet Take 81 mg by mouth daily.   bisacodyl (DULCOLAX) 5 MG EC tablet Take 1  tablet (5 mg total) by mouth daily as needed for moderate constipation.   calcium carbonate (TUMS EX) 750 MG chewable tablet Chew 1 tablet by mouth daily as needed for heartburn.   Cholecalciferol (VITAMIN D3) 1000 UNITS CAPS Take 1,000 Units by mouth daily.   dapagliflozin propanediol (FARXIGA) 10 MG TABS tablet Take 1 tablet (10 mg total) by mouth daily.   Dulaglutide (TRULICITY) 3 LD/3.5TS SOPN Inject 3 mg into the skin once a week.   fexofenadine (ALLEGRA) 180 MG tablet Take 180 mg by mouth daily.   FLUoxetine (PROZAC) 40 MG capsule TAKE 1 CAPSULE BY MOUTH EVERY DAY   furosemide (LASIX) 20 MG tablet TAKE 1 TABLET BY MOUTH EVERY DAY (Patient not taking: Reported on 11/23/2021)   gabapentin (NEURONTIN) 300 MG capsule TAKE 2 CAPSULES BY MOUTH TWICE A DAY   hydrALAZINE (APRESOLINE) 25 MG tablet Take 1 tablet (25 mg total) by mouth 2 (two) times daily. For blood pressure   Insulin Glargine (BASAGLAR KWIKPEN) 100 UNIT/ML Inject 30 Units into the skin daily. Profile until patient requests   Insulin Pen Needle 32G X 4 MM MISC 1 Device by Does not apply route daily.   loperamide (IMODIUM A-D) 2 MG tablet Take 1 tablet (2 mg total) by mouth 2 (two) times daily as needed for diarrhea or loose stools. (Patient not taking: Reported on 11/27/2021)   LORazepam (ATIVAN) 0.5 MG tablet Take 1 tablet (0.5 mg total) by mouth 2 (two) times daily as needed for anxiety.   metFORMIN (GLUCOPHAGE-XR) 500 MG 24 hr tablet TAKE 2 TABLETS BY MOUTH TWICE A DAY  montelukast (SINGULAIR) 10 MG tablet Take 1 tablet (10 mg total) by mouth at bedtime. For allergies / asthma   nitroGLYCERIN (NITROSTAT) 0.4 MG SL tablet Place 1 tablet (0.4 mg total) under the tongue every 5 (five) minutes as needed for chest pain.   ondansetron (ZOFRAN) 4 MG tablet Take 1 tablet (4 mg total) by mouth every 8 (eight) hours as needed for nausea or vomiting.   oxyCODONE (ROXICODONE) 5 MG immediate release tablet Take 1 tablet (5 mg total) by mouth every  4 (four) hours as needed for severe pain.   rosuvastatin (CRESTOR) 20 MG tablet Take 1 tablet (20 mg total) by mouth daily. For cholesterol / heart   Tiotropium Bromide Monohydrate (SPIRIVA RESPIMAT) 2.5 MCG/ACT AERS Inhale 2 puffs into the lungs daily. (Patient taking differently: Inhale 2 puffs into the lungs daily as needed (Shortness of breath).)   traZODone (DESYREL) 50 MG tablet TAKE 1/2 TO 1 TABLET BY MOUTH AT BEDTIME AS NEEDED FOR SLEEP (Patient not taking: Reported on 11/23/2021)   valsartan (DIOVAN) 160 MG tablet Take 0.5 tablets (80 mg total) by mouth daily.   zinc gluconate 50 MG tablet Take 50 mg by mouth daily.   No facility-administered encounter medications on file as of 01/03/2022.    Patient Active Problem List   Diagnosis Date Noted   Hypoxia 11/27/2021   Osteopenia 06/27/2021   Melena 12/18/2020   History of colonic polyps 12/18/2020   Constipation 12/18/2020   Obesity    Iron deficiency anemia    Insulin resistance    Hyperlipidemia    Cervical spondylosis    Anxiety    Pneumonia due to COVID-19 virus 08/21/2020   Hyperlipidemia associated with type 2 diabetes mellitus ([place]) 08/21/2020   Type 2 diabetes mellitus with diabetic polyneuropathy, with long-term current use of insulin ([place]) 12/10/2019   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   Tinnitus of both ears 11/18/2019   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   Eustachian tube dysfunction, bilateral 11/18/2019   Chronic eczematous otitis externa of both ears 11/18/2019   Obesity (BMI 30-39.9) 08/24/2018   Depression with anxiety 08/24/2018   CAD (coronary artery disease) 08/24/2018   Abnormal cardiovascular stress test 08/24/2018   Precordial chest pain    Sepsis ([place]) 09/18/2017   Chronic diastolic CHF (congestive heart failure) ([place]) 09/18/2017   Chronic rhinitis 08/07/2016   COPD with acute exacerbation ([place]) 04/25/2016   Hypertension associated with diabetes ([place])  01/30/2016   OSA (obstructive sleep apnea) 05/06/2014   Dyspnea on exertion 09/02/2012   GERD 06/28/2008    Conditions to be addressed/monitored:CHF, COPD, and DMII  Care Plan : RN Care Manager Plan of Care  Updates made by Luretha Rued, RN since 01/03/2022 12:00 AM     Problem: Chronic disease Management education and/or care coordination needs Resolved 01/03/2022  Priority: High     Long-Range Goal: Developement of Plan of Care for Chronic Disease Management Completed 01/03/2022  Start Date: 08/13/2021  Expected End Date: 02/10/2022  Priority: High  Note:   Goals Met. Case Closed Current Barriers: Patient attending provider visits as scheduled and contacts provider with questions or concern. Reporting no signs/symptoms of COPD/HF exacerbation. Working with clinical pharmacist regarding [employer]. A1C improved from 9.8 on 08/22/20 to 7.0 on 11/27/21. Continues to work with clinical pharmacist regarding [employer]. Patient in agreement with close of case. Patient encouraged to contact PCP if any nursing/social work or [employer] needs in the future. Knowledge  Deficits related to plan of care for management of CHF, COPD, and DMII  Chronic Disease Management support and education needs related to CHF, COPD, and DMII  RNCM Clinical Goal(s):  Patient will verbalize understanding of plan for management of CHF, COPD, and DMII as evidenced by self report and/or chart notation  through collaboration with RN Care manager, provider, and care team.   Interventions: 1:1 collaboration with primary care provider regarding development and update of comprehensive plan of care as evidenced by provider attestation and co-signature Inter-disciplinary care team collaboration (see longitudinal plan of care) Evaluation of current treatment plan related to  self management and patient's adherence to plan as established by provider  Heart Failure Interventions:   (Status:  Goal Met.) Long Term Goal Discussed the importance of keeping all appointments with provider Reinforced the importance of taking medications as prescribed Reiterated signs/symptoms of heart failure exacerbation  COPD Interventions:  (Status:  Goal Met.) Long Term Goal Medications reviewed and encouraged to continue to take as prescribed Encouraged to attend provider visit as scheduled Encouraged to continue to track and manage triggers  Diabetes Interventions:  (Status:  Goal Met ) Long Term Goal Assessed patient's understanding of A1c goal: <7% Reviewed medications with patient and discussed importance of medication adherence Reviewed follow up appointment. Encouraged to call to schedule follow up visit with endocrinologist Provided positive feedback A1C decrease from 8.2 on 03/12/21 to 7.3 on 08/22/21.  RNCM encouraged patient to continue to minimize concentrated sweets and saturated fats. Lab Results  Component Value Date   HGBA1C 7.0 (H) 11/27/2021    Patient Goals/Self-Care Activities: Call Dr. Kelton Pillar to reschedule your follow up appointment Take medications as prescribed   Attend all scheduled provider appointments Continue to follow the COPD action plan Continue to check Blood sugars per provider recommendation and take blood sugar log to office visits. Notify provider if consistently outside recommended parameters Call your provider with any Health questions or concerns Continue to work on eating a healthy diet: protein rich foods, fruits, vegetables, low salt. Cut fat from your meat and eat whole grain breads and cereals. Avoid greasy, processed food or foods that contain saturated fats and trans fats Continue to work with care management team for ongoing care coordination  and care management needs   Plan:No further follow up required: Patient encouraged to contact PCP for any nursing, social work or [employer] needs going forward. RNCM will update clinical  pharmacist, currently involved in case.  Thea Silversmith, RN, MSN, BSN, CCM Care Management Coordinator [facility] [phone]

## 2022-01-04 ENCOUNTER — Other Ambulatory Visit: Payer: Self-pay | Admitting: Family Medicine

## 2022-01-09 DIAGNOSIS — S52591D Other fractures of lower end of right radius, subsequent encounter for closed fracture with routine healing: Secondary | ICD-10-CM | POA: Diagnosis not present

## 2022-01-11 DIAGNOSIS — I509 Heart failure, unspecified: Secondary | ICD-10-CM | POA: Diagnosis not present

## 2022-01-11 DIAGNOSIS — J449 Chronic obstructive pulmonary disease, unspecified: Secondary | ICD-10-CM | POA: Diagnosis not present

## 2022-01-11 DIAGNOSIS — E1159 Type 2 diabetes mellitus with other circulatory complications: Secondary | ICD-10-CM

## 2022-01-11 DIAGNOSIS — Z794 Long term (current) use of insulin: Secondary | ICD-10-CM

## 2022-01-17 ENCOUNTER — Telehealth: Payer: Medicare Other

## 2022-02-06 DIAGNOSIS — S52501D Unspecified fracture of the lower end of right radius, subsequent encounter for closed fracture with routine healing: Secondary | ICD-10-CM | POA: Diagnosis not present

## 2022-02-06 DIAGNOSIS — S52591D Other fractures of lower end of right radius, subsequent encounter for closed fracture with routine healing: Secondary | ICD-10-CM | POA: Diagnosis not present

## 2022-02-12 DIAGNOSIS — M6281 Muscle weakness (generalized): Secondary | ICD-10-CM | POA: Diagnosis not present

## 2022-02-12 DIAGNOSIS — S52501D Unspecified fracture of the lower end of right radius, subsequent encounter for closed fracture with routine healing: Secondary | ICD-10-CM | POA: Diagnosis not present

## 2022-02-12 NOTE — Progress Notes (Unsigned)
[facility] [address], [place], [place] [address] [phone]  Date:  02/13/2022   Name:  ANNSLEE TERCERO   DOB:  January 19, 1951   MRN:  [id number]  PCP:  Darreld Mclean, MD    Chief Complaint: No chief complaint on file.   History of Present Illness:  CHARDAI GANGEMI is a 71 y.o. very pleasant female patient who presents with the following:  Patient seen today for concern of nausea- history of CAD with cardiology care, hypertension, CHF, sleep apnea, COPD, diabetes, GERD, hyperlipidemia   Most recent visit with myself was in May Later in May, she unfortunately suffered a wrist fracture and underwent ORIF per Dr. Mardelle Matte They had some trouble getting her off oxygen after her surgery so she ended up spending 2 nights inpatient  Lab Results  Component Value Date   HGBA1C 7.0 (H) 11/27/2021   Mammogram Patient Active Problem List   Diagnosis Date Noted   Hypoxia 11/27/2021   Osteopenia 06/27/2021   Melena 12/18/2020   History of colonic polyps 12/18/2020   Constipation 12/18/2020   Obesity    Iron deficiency anemia    Insulin resistance    Hyperlipidemia    Cervical spondylosis    Anxiety    Pneumonia due to COVID-19 virus 08/21/2020   Hyperlipidemia associated with type 2 diabetes mellitus ([place]) 08/21/2020   Type 2 diabetes mellitus with diabetic polyneuropathy, with long-term current use of insulin ([place]) 12/10/2019   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   Tinnitus of both ears 11/18/2019   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   Eustachian tube dysfunction, bilateral 11/18/2019   Chronic eczematous otitis externa of both ears 11/18/2019   Obesity (BMI 30-39.9) 08/24/2018   Depression with anxiety 08/24/2018   CAD (coronary artery disease) 08/24/2018   Abnormal cardiovascular stress test 08/24/2018   Precordial chest pain    Sepsis ([place]) 09/18/2017    Chronic diastolic CHF (congestive heart failure) ([place]) 09/18/2017   Chronic rhinitis 08/07/2016   COPD with acute exacerbation ([place]) 04/25/2016   Hypertension associated with diabetes ([place]) 01/30/2016   OSA (obstructive sleep apnea) 05/06/2014   Dyspnea on exertion 09/02/2012   GERD 06/28/2008    Past Medical History:  Diagnosis Date   Anxiety    Prior suicide attempt   CAD (coronary artery disease)    a) s/p DES to LAD 07/2005 b) Last Myoview low risk 11/2011 showing small fixed apical perfusion defect (prior MI vs attenuation) but no ischemia - normal EF.   Cervical spondylosis    Chest pain 12/10/2011   Chronic diastolic CHF (congestive heart failure) ([facility]) 09/18/2017   Chronic eczematous otitis externa of both ears 11/18/2019   Chronic rhinitis 69/48/5462   Complication of anesthesia    developed pneumonia after nerve block was hospitalized   Constipation 12/18/2020   COPD with acute exacerbation ([place]) 04/25/2016   Coronary atherosclerosis 06/28/2008   Depression    Depression with anxiety    Diabetes mellitus ([place]) 09/08/2017   Diabetic neuropathy ([place])    Dyspnea on exertion 09/02/2012   CXR 07/2012:  No acute process.     Eustachian tube dysfunction, bilateral 11/18/2019   GERD 06/28/2008   Hearing loss    Left   History of colonic polyps 12/18/2020   History of kidney stones    HLD (hyperlipidemia)    Hyperlipidemia associated with type 2 diabetes mellitus ([place]) 08/21/2020  Hypertension    Hypertension associated with diabetes ([place]) 01/30/2016   Hypokalemia 01/27/2016   Insulin resistance    Iron deficiency anemia    Leukocytosis    history of mild leukocytosis   Lobar pneumonia, unspecified organism ([place]) 11/13/2017   Melena 12/18/2020   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   Obesity (BMI 30-39.9) 08/24/2018   Last Assessment & Plan:  Exercise and weight reduction is encouraged.   OSA (obstructive sleep apnea) 05/06/2014    Pneumonia due to COVID-19 virus 08/21/2020   Precordial chest pain    Sciatic nerve disease, left    leg   Sepsis ([place]) 09/18/2017   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   Tinnitus of both ears 11/18/2019   Type 2 diabetes mellitus with hyperglycemia, with long-term current use of insulin ([place]) 12/10/2019   Uncontrolled type 2 diabetes mellitus with complication, with long-term current use of insulin 03/14/2020    Past Surgical History:  Procedure Laterality Date   BREAST ENHANCEMENT SURGERY     CARDIAC CATHETERIZATION  06/17/2007   NORMAL. EF 60%   CARDIAC CATHETERIZATION N/A 01/29/2016   Procedure: Left Heart Cath and Coronary Angiography;  Surgeon: Sherren Mocha, MD;  Location: [facility];  Service: Cardiovascular;  Laterality: N/A;   CERVICAL SPONDYLOSIS     SINGLE LEVEL FUSION   CHILDBIRTH     X3   COLONOSCOPY  2010   normal   COLONOSCOPY WITH PROPOFOL  10/01/2021   CORONARY STENT PLACEMENT  07/15/2005   LEFT ANTERIOR DESCENDING   FOREARM FRACTURE SURGERY  07/15/2008   hand and shoulder    INCISION AND DRAINAGE BREAST ABSCESS  01/05/2012       INCISION AND DRAINAGE PERIRECTAL ABSCESS N/A 02/18/2014   Procedure: IRRIGATION AND DEBRIDEMENT PERIRECTAL ABSCESS;  Surgeon: Pedro Earls, MD;  Location: [facility];  Service: General;  Laterality: N/A;   LEFT HEART CATH AND CORONARY ANGIOGRAPHY N/A 10/10/2017   Procedure: LEFT HEART CATH AND CORONARY ANGIOGRAPHY;  Surgeon: Burnell Blanks, MD;  Location: [facility];  Service: Cardiovascular;  Laterality: N/A;   LUMBAR LAMINECTOMY     ORIF WRIST FRACTURE Right 11/27/2021   Procedure: OPEN REDUCTION INTERNAL FIXATION (ORIF) WRIST FRACTURE;  Surgeon: Marchia Bond, MD;  Location: [facility];  Service: Orthopedics;  Laterality: Right;   ROTATOR CUFF REPAIR     bil[place]la   TONSILLECTOMY AND ADENOIDECTOMY     TUBAL LIGATION     VIDEO BRONCHOSCOPY Bilateral 08/13/2016   Procedure: VIDEO BRONCHOSCOPY  WITHOUT FLUORO;  Surgeon: Collene Gobble, MD;  Location: [facility];  Service: Cardiopulmonary;  Laterality: Bilateral;    Social History   Tobacco Use   Smoking status: Never    Passive exposure: Yes   Smokeless tobacco: Never  Vaping Use   Vaping Use: Never used  Substance Use Topics   Alcohol use: Yes    Comment: occ   Drug use: No    Family History  Problem Relation Age of Onset   Heart attack Mother    Diabetes Mother    Lung cancer Mother    Asthma Mother    Heart disease Mother    Suicidality Father        "killed himself"   Diabetes Sister    Cancer Sister    Colon cancer Sister 65   Asthma Daughter        x 2   Cancer Daughter        pre-cancerous polyp  Cervical cancer Daughter        cervical    Allergies Other        all family--seasonal allergies    Allergies  Allergen Reactions   Prednisone Other (See Comments)    REACTION: mood swings, nightmares. "Shot doesn't bother me, reaction is just with the pill" she states she has had the steroid injections before. From our records methylprednisone was given to her in 2013 without any complications.    Medication list has been reviewed and updated.  Current Outpatient Medications on File Prior to Visit  Medication Sig Dispense Refill   acetaminophen (TYLENOL) 325 MG tablet Take 1-2 tablets (325-650 mg total) by mouth every 6 (six) hours as needed for mild pain (pain score 1-3 or temp > 100.5).     albuterol (VENTOLIN HFA) 108 (90 Base) MCG/ACT inhaler INHALE 2 PUFFS INTO THE LUNGS EVERY EIGHT HOURS AS NEEDED FOR WHEEZING OR SHORTNESS OF BREATH. 18 g 0   aspirin EC 81 MG tablet Take 81 mg by mouth daily.     bisacodyl (DULCOLAX) 5 MG EC tablet Take 1 tablet (5 mg total) by mouth daily as needed for moderate constipation. 30 tablet 0   calcium carbonate (TUMS EX) 750 MG chewable tablet Chew 1 tablet by mouth daily as needed for heartburn.     Cholecalciferol (VITAMIN D3) 1000 UNITS CAPS Take 1,000 Units by  mouth daily.     dapagliflozin propanediol (FARXIGA) 10 MG TABS tablet Take 1 tablet (10 mg total) by mouth daily. 90 tablet 3   Dulaglutide (TRULICITY) 3 IR/4.8NI SOPN Inject 3 mg into the skin once a week.     fexofenadine (ALLEGRA) 180 MG tablet Take 180 mg by mouth daily.     FLUoxetine (PROZAC) 40 MG capsule TAKE 1 CAPSULE BY MOUTH EVERY DAY 90 capsule 1   furosemide (LASIX) 20 MG tablet TAKE 1 TABLET BY MOUTH EVERY DAY (Patient not taking: Reported on 11/23/2021) 90 tablet 2   gabapentin (NEURONTIN) 300 MG capsule TAKE 2 CAPSULES BY MOUTH TWICE A DAY 360 capsule 1   hydrALAZINE (APRESOLINE) 25 MG tablet Take 1 tablet (25 mg total) by mouth 2 (two) times daily. For blood pressure 360 tablet 1   Insulin Glargine (BASAGLAR KWIKPEN) 100 UNIT/ML Inject 30 Units into the skin daily. Profile until patient requests 15 mL 1   Insulin Pen Needle 32G X 4 MM MISC 1 Device by Does not apply route daily. 100 each 3   loperamide (IMODIUM A-D) 2 MG tablet Take 1 tablet (2 mg total) by mouth 2 (two) times daily as needed for diarrhea or loose stools. (Patient not taking: Reported on 11/27/2021) 30 tablet 0   LORazepam (ATIVAN) 0.5 MG tablet Take 1 tablet (0.5 mg total) by mouth 2 (two) times daily as needed for anxiety. 30 tablet 1   metFORMIN (GLUCOPHAGE-XR) 500 MG 24 hr tablet TAKE 2 TABLETS BY MOUTH TWICE A DAY 360 tablet 3   montelukast (SINGULAIR) 10 MG tablet Take 1 tablet (10 mg total) by mouth at bedtime. For allergies / asthma 90 tablet 1   nitroGLYCERIN (NITROSTAT) 0.4 MG SL tablet Place 1 tablet (0.4 mg total) under the tongue every 5 (five) minutes as needed for chest pain. 20 tablet 3   ondansetron (ZOFRAN) 4 MG tablet Take 1 tablet (4 mg total) by mouth every 8 (eight) hours as needed for nausea or vomiting. 10 tablet 0   oxyCODONE (ROXICODONE) 5 MG immediate release tablet Take 1 tablet (5  mg total) by mouth every 4 (four) hours as needed for severe pain. 30 tablet 0   rosuvastatin (CRESTOR) 20 MG  tablet Take 1 tablet (20 mg total) by mouth daily. For cholesterol / heart 90 tablet 3   Tiotropium Bromide Monohydrate (SPIRIVA RESPIMAT) 2.5 MCG/ACT AERS Inhale 2 puffs into the lungs daily. (Patient taking differently: Inhale 2 puffs into the lungs daily as needed (Shortness of breath).) 4 g 5   traZODone (DESYREL) 50 MG tablet TAKE 1/2 TO 1 TABLET BY MOUTH AT BEDTIME AS NEEDED FOR SLEEP (Patient not taking: Reported on 11/23/2021) 90 tablet 1   valsartan (DIOVAN) 160 MG tablet Take 0.5 tablets (80 mg total) by mouth daily. 90 tablet 3   zinc gluconate 50 MG tablet Take 50 mg by mouth daily.     No current facility-administered medications on file prior to visit.    Review of Systems:  As per HPI- otherwise negative.   Physical Examination: There were no vitals filed for this visit. There were no vitals filed for this visit. There is no height or weight on file to calculate BMI. Ideal Body Weight:    GEN: no acute distress. HEENT: Atraumatic, Normocephalic.  Ears and Nose: No external deformity. CV: RRR, No M/G/R. No JVD. No thrill. No extra heart sounds. PULM: CTA B, no wheezes, crackles, rhonchi. No retractions. No resp. distress. No accessory muscle use. ABD: S, NT, ND, +BS. No rebound. No HSM. EXTR: No c/c/e PSYCH: Normally interactive. Conversant.    Assessment and Plan: ***  Signed Lamar Blinks, MD

## 2022-02-13 ENCOUNTER — Ambulatory Visit (INDEPENDENT_AMBULATORY_CARE_PROVIDER_SITE_OTHER): Payer: Medicare Other | Admitting: Family Medicine

## 2022-02-13 VITALS — BP 120/60 | HR 72 | Temp 97.7°F | Resp 18 | Ht 59.0 in | Wt 164.2 lb

## 2022-02-13 DIAGNOSIS — R1013 Epigastric pain: Secondary | ICD-10-CM | POA: Diagnosis not present

## 2022-02-13 DIAGNOSIS — E119 Type 2 diabetes mellitus without complications: Secondary | ICD-10-CM | POA: Diagnosis not present

## 2022-02-13 DIAGNOSIS — I25118 Atherosclerotic heart disease of native coronary artery with other forms of angina pectoris: Secondary | ICD-10-CM

## 2022-02-13 DIAGNOSIS — R112 Nausea with vomiting, unspecified: Secondary | ICD-10-CM

## 2022-02-13 MED ORDER — PANTOPRAZOLE SODIUM 40 MG PO TBEC: 40.0000 mg | DELAYED_RELEASE_TABLET | Freq: Every day | ORAL | 3 refills | Status: DC

## 2022-02-13 NOTE — Patient Instructions (Addendum)
Good to see you today- I will be in touch with your results asap We will set you up for an ultrasound of your gallbladder Let me know if you are getting worse or have any change in your symptoms Start on pantoprazole one daily for reflux for now

## 2022-02-14 ENCOUNTER — Ambulatory Visit (HOSPITAL_BASED_OUTPATIENT_CLINIC_OR_DEPARTMENT_OTHER)
Admission: RE | Admit: 2022-02-14 | Discharge: 2022-02-14 | Disposition: A | Discharge status: Home / Self Care | Payer: Medicare Other | Source: Ambulatory Visit | Attending: Family Medicine | Admitting: Family Medicine

## 2022-02-14 ENCOUNTER — Encounter: Payer: Self-pay | Admitting: Family Medicine

## 2022-02-14 DIAGNOSIS — R1013 Epigastric pain: Secondary | ICD-10-CM | POA: Diagnosis not present

## 2022-02-14 DIAGNOSIS — N281 Cyst of kidney, acquired: Secondary | ICD-10-CM | POA: Diagnosis not present

## 2022-02-14 LAB — CBC
HCT: 39.1 % (ref 36.0–46.0)
Hemoglobin: 12.4 g/dL (ref 12.0–15.0)
MCHC: 31.7 g/dL (ref 30.0–36.0)
MCV: 79.8 fl (ref 78.0–100.0)
Platelets: 229 10*3/uL (ref 150.0–400.0)
RBC: 4.9 Mil/uL (ref 3.87–5.11)
RDW: 15.1 % (ref 11.5–15.5)
WBC: 12.1 10*3/uL — ABNORMAL HIGH (ref 4.0–10.5)

## 2022-02-14 LAB — COMPREHENSIVE METABOLIC PANEL
ALT: 16 U/L (ref 0–35)
AST: 16 U/L (ref 0–37)
Albumin: 4.1 g/dL (ref 3.5–5.2)
Alkaline Phosphatase: 87 U/L (ref 39–117)
BUN: 12 mg/dL (ref 6–23)
CO2: 29 mEq/L (ref 19–32)
Calcium: 9.4 mg/dL (ref 8.4–10.5)
Chloride: 100 mEq/L (ref 96–112)
Creatinine, Ser: 0.59 mg/dL (ref 0.40–1.20)
GFR: 89.84 mL/min (ref >60.00)
Glucose, Bld: 104 mg/dL — ABNORMAL HIGH (ref 70–99)
Potassium: 4.6 mEq/L (ref 3.5–5.1)
Sodium: 140 mEq/L (ref 135–145)
Total Bilirubin: 0.3 mg/dL (ref 0.2–1.2)
Total Protein: 7.1 g/dL (ref 6.0–8.3)

## 2022-02-14 LAB — HEMOGLOBIN A1C: Hgb A1c MFr Bld: 7.2 % — ABNORMAL HIGH (ref 4.6–6.5)

## 2022-02-14 LAB — LIPASE: Lipase: 51 U/L (ref 11.0–59.0)

## 2022-02-15 ENCOUNTER — Other Ambulatory Visit: Payer: Self-pay | Admitting: Family Medicine

## 2022-02-16 ENCOUNTER — Other Ambulatory Visit: Payer: Self-pay | Admitting: Family Medicine

## 2022-02-16 DIAGNOSIS — J209 Acute bronchitis, unspecified: Secondary | ICD-10-CM

## 2022-02-18 DIAGNOSIS — S52501D Unspecified fracture of the lower end of right radius, subsequent encounter for closed fracture with routine healing: Secondary | ICD-10-CM | POA: Diagnosis not present

## 2022-02-18 DIAGNOSIS — M6281 Muscle weakness (generalized): Secondary | ICD-10-CM | POA: Diagnosis not present

## 2022-02-19 ENCOUNTER — Encounter: Payer: Self-pay | Admitting: Family Medicine

## 2022-02-19 LAB — H. PYLORI BREATH TEST: H. pylori Breath Test: NOT DETECTED

## 2022-02-25 DIAGNOSIS — S52501D Unspecified fracture of the lower end of right radius, subsequent encounter for closed fracture with routine healing: Secondary | ICD-10-CM | POA: Diagnosis not present

## 2022-02-25 DIAGNOSIS — M6281 Muscle weakness (generalized): Secondary | ICD-10-CM | POA: Diagnosis not present

## 2022-02-27 ENCOUNTER — Ambulatory Visit (INDEPENDENT_AMBULATORY_CARE_PROVIDER_SITE_OTHER): Payer: Medicare Other | Admitting: Pharmacist

## 2022-02-27 DIAGNOSIS — I25118 Atherosclerotic heart disease of native coronary artery with other forms of angina pectoris: Secondary | ICD-10-CM

## 2022-02-27 DIAGNOSIS — R600 Localized edema: Secondary | ICD-10-CM

## 2022-02-27 DIAGNOSIS — E1159 Type 2 diabetes mellitus with other circulatory complications: Secondary | ICD-10-CM

## 2022-02-27 DIAGNOSIS — J449 Chronic obstructive pulmonary disease, unspecified: Secondary | ICD-10-CM

## 2022-02-27 DIAGNOSIS — E119 Type 2 diabetes mellitus without complications: Secondary | ICD-10-CM

## 2022-02-27 DIAGNOSIS — E785 Hyperlipidemia, unspecified: Secondary | ICD-10-CM

## 2022-02-27 DIAGNOSIS — R112 Nausea with vomiting, unspecified: Secondary | ICD-10-CM

## 2022-02-27 DIAGNOSIS — I5032 Chronic diastolic (congestive) heart failure: Secondary | ICD-10-CM

## 2022-02-27 NOTE — Chronic Care Management (AMB) (Unsigned)
Chronic Care Management Pharmacy Note  02/27/2022 Name:  Destiny Ballard MRN:  [id number] DOB:  02-07-1951  Summary:  Diabetes: Improving. Goal A1c is < 7.0%; Weight was 178 lbs in February 2022 and down to 164 (02/13/2022) - has lost about 14 lbs in the last year.  Current treatment: Farxiga 30m daily, Basaglar 30 units daily (patient reports she is not taking every day - Patient reports she has been skipping Basaglar again - usually holds when blood glucose is around 1782or less), Trulicity 32mweekly (increased 02/2021), metformin ER 50046m take 2 tablets twice a day.Noted patient reported nausea and vomiting at appointment 02/13/2022. She states today nausea has improved some with addition of pantoprazole 66m41mily . Could be from GLP1 agent. Discussed being more mindful of portions and stopping intake when she starts to feel full.  If continues to have nausea / vomiting might need to either lower dose of Trulicity to 1.7m9.5AOkly to hold to see if symptoms improved Discuss with Dr ShamKelton Pillarupcoming appointment 03/01/2022. Recommended patient try to take Basaglar EVERY day and suggested lowering dose to 25 units daily if she is afraid of low blood glucose. Reviewed that BasaNancee Litera long acting insulin and does not lower blood glucose quickly.  Hypertension / CHF: Controlled - blood pressure in office 02/13/2022 was 120/60. Current therapy: hydralazine 27mg29m tablets = 50mg 38me a day (dose lowered from 50mg t70m a day to 27mg tw63ma day during hospitalization 11/2021 but patient restarted previous dose when she returned home), furosemide 20mg dai73monly taking as needed), Valsartan 160mg dail78mose was lowered to 80mg or 0.53mblet daily during hospitalization 11/2021 but patient restarted previous dose when she returned home), metoprolol succinate 100mg daily,66mxiga 10mg daily. 39ment mentions trace edema, elevates legs every day when at home. Reports occasional dizziness. No  home blood pressure readings to report. Reviewed signs and symptoms of CHF exacerbation - weight gain, SOB, abdominal fullness, swelling in legs or abdomen, Fatigue and weakness, changes in ability to perform usual activities, persistent cough or wheezing with white or pink blood-tinged mucus, nausea and lack of appetite. Continue to weigh daily - report weight gain of more than 3 lbs in 24 hours or 5 lbs in 1 week. Consulting with PCP about lowering hydralazine to 27mg twice a 77msince patient has had episodes of dizziness.  Continue current dose of valsartan 160mg daily at 81m time. Restart furosemide 20mg daily - up17md prescription sent to pharmacy.  COPD: Controlled; Goal: prevent exacerbations and hospitalizations. Patient reported she is having difficulty using Spiriva Respimat after wrist fracture / surgery. Checked patient's formulary for alternatives and will discuss with provider.Could try Spiriva Handihaler in place of Respimat device and might be easier to use - cost if $40 / 90 days. Incruse will need prior authorization and cost will be $70 / 90 days (if can find manufacturer coupon might lower cost). Atrovent HFA - cost $70 / 90 days. Tudorza will neeCaprice Renshaw authorization and cost will be $70 / 90 days (if can find manufacturer coupon might lower cost). Updated prescription for albuterol at pharmacy Osteopenia:  Recent fracture in May 2023 of L2 and wrist after a fall while playing with her grandson. Fell on hard surface from standing.  Dexa results form 12/08/203: AP Spine T-Score = -1.1; Hip T-Score = -2.0; FRAX estimate = 18.4% of major osteoporotic fracture and 3.8% hip fracture.  Discussed DEXA results. Consider reassessment of  fracure risk and pharmacotherapy intervention with recent fracture. Recommended calcium 1200mg  daily and  vitamin D 1000 units daily.  Medication Management: Requested non safety caps due to patient having difficulty opening medication bottles after fracture  wrist and surgery.   Subjective: Destiny Ballard is an 71 y.o. year old female who is a primary patient of Copland, Gay Filler, MD.  The CCM team was consulted for assistance with disease management and care coordination needs.    Collaboration with patient  for  follow up  in response to provider referral for pharmacy case management and/or care coordination services.   Consent to Services:  The patient was given information about Chronic Care Management services, agreed to services, and gave verbal consent prior to initiation of services.  Please see initial visit note for detailed documentation.   Patient Care Team: Copland, Gay Filler, MD as PCP - General (Family Medicine) Brien Few, MD as Consulting Physician (Obstetrics and Gynecology) [name], RPH-CPP (Pharmacist)  Recent office visits: 02/13/2022 - Fam Med (Dr Lorelei Pont) Seen for type 2 DM follow up and nausea with sometimes vomiting. Started Pantopraozle 40mg  daily. GI consult ordered. Lipase was normal (upper end of normal)  11/12/2021 - Fam Med (Dr Lorelei Pont) Acute visit for skin issue in gluteal cleft. Recommended barrier cream like desitin 3 to 4 times daily. F/U 2 months for labs. Removed dicyclomine from med list - patient not taking 08/20/2021 - PCP (Dr Lorelei Pont) Seen for follow up of chronic conditions and pain over her tailbone for 3 months after fall on step. Declined mammogram; referred to GI for colonoscopy. Labs and xray of coccyn ordered.    Recent consult visits: 11/15/2021 - Podiatry (Dr Sherryle Lis) F/U ingrown left great toenail. Noted to be healing well. Can discontinue soaks and ointments.  10/16/2021 - Podiatry (Dr Sherryle Lis) Ingrown left great toenail. Excised ingrown toenail. Prescribed cefadroxi l500mg  bid and triple antibiotic to nail beds twice a day after soaks.  09/26/2021 - GI (Dr Fuller Plan) Seen for diarrhea wiht occasional fecal incontinence. Recommended 5-7 day meformin holiday. Started dicyclomine 10mg   before meals and at bedtime. Recommended colonoscopy.  06/15/2021 Cardio (Dr Agustin Cree) F/U CAD and CHF. No med changes mentioned in note but Trulicity dose adjusted on med list from 3mg  weekly to 1.5mg  weekly    Hospital visits: 11/27/2021 to 11/29/2021- Hospital Admission at [facility]. Patient has planned procedure of right distal radius ORIF but in PACU patient was not able to be weaned from O2 after anesthesia therefore she was admitted. Discharged on 2L of O2.  Medications stopped at discharge: diphenhydramine and metoprolol succinate 100mg .  Medications STARTed at discharge: acetaminophen (TYLENOL) and bisacodyl (DULCOLAX) Medication CHANGed at discharge:  hydrALAZINE (APRESOLINE) 25mg  - dose decreaed from 50mg  bid to 25mg  bid valsartan (DIOVAN) 160mg  - dose decreaased to 0.5 tablet = 80mg  daily  11/21/2021 - ED Visit at [facility] for fall. Pain in right wrist and hip. X-Ray right hip. No evidence of fracture. Right impaacted writ fracture. Splinted wrist. Prescribed oxycodone/APAP 5/320 every 6 hours as needed #10; F/U with Dr Mardelle Matte.   Objective:  Lab Results  Component Value Date   CREATININE 0.59 02/13/2022   CREATININE 0.56 11/29/2021   CREATININE 0.58 11/28/2021    Lab Results  Component Value Date   HGBA1C 7.2 (H) 02/13/2022   Last diabetic Eye exam:  Lab Results  Component Value Date/Time   HMDIABEYEEXA No Retinopathy 03/08/2021 12:00 AM    Last diabetic Foot exam: No results found  for: "HMDIABFOOTEX"      Component Value Date/Time   CHOL 130 01/01/2021 1144   TRIG 266 (H) 01/01/2021 1144   HDL 44 01/01/2021 1144   CHOLHDL 3.0 01/01/2021 1144   CHOLHDL 3 09/09/2019 1128   VLDL 72.6 (H) 09/09/2019 1128   LDLCALC 45 01/01/2021 1144   LDLDIRECT 64.0 09/09/2019 1128       Latest Ref Rng & Units 02/13/2022    3:31 PM 02/09/2021    4:08 PM 11/18/2020    6:30 PM  Hepatic Function  Total Protein 6.0 - 8.3 g/dL 7.1  6.3  7.2   Albumin 3.5 -  5.2 g/dL 4.1  3.9  3.6   AST 0 - 37 U/L 16  14  20    ALT 0 - 35 U/L 16  11  20    Alk Phosphatase 39 - 117 U/L 87  82  78   Total Bilirubin 0.2 - 1.2 mg/dL 0.3  <0.2  0.3     Lab Results  Component Value Date/Time   TSH 2.361 01/27/2016 02:52 AM   TSH 0.83 03/24/2014 11:14 AM   TSH 0.88 09/10/2013 02:41 PM       Latest Ref Rng & Units 02/13/2022    3:31 PM 11/29/2021    6:45 AM 11/28/2021    3:25 AM  CBC  WBC 4.0 - 10.5 K/uL 12.1  14.3  9.9   Hemoglobin 12.0 - 15.0 g/dL 12.4  11.5  11.4   Hematocrit 36.0 - 46.0 % 39.1  38.2  38.6   Platelets 150.0 - 400.0 K/uL 229.0  237  224     Lab Results  Component Value Date/Time   VD25OH 26 (L) 09/16/2011 10:58 AM    Clinical ASCVD: Yes  The 10-year ASCVD risk score (Arnett DK, et al., 2019) is: 23.4%   Values used to calculate the score:     Age: 64 years     Sex: Female     Is Non-Hispanic African American: No     Diabetic: Yes     Tobacco smoker: No     Systolic Blood Pressure: 144 mmHg     Is BP treated: Yes     HDL Cholesterol: 44 mg/dL     Total Cholesterol: 130 mg/dL    Social History   Tobacco Use  Smoking Status Never   Passive exposure: Yes  Smokeless Tobacco Never   BP Readings from Last 3 Encounters:  02/13/22 120/60  11/29/21 (!) 111/50  11/21/21 (!) 142/74   Pulse Readings from Last 3 Encounters:  02/13/22 72  11/29/21 78  11/21/21 74   Wt Readings from Last 3 Encounters:  02/13/22 164 lb 3.2 oz (74.5 kg)  11/27/21 165 lb (74.8 kg)  11/21/21 160 lb (72.6 kg)    Assessment: Review of patient past medical history, allergies, medications, health status, including review of consultants reports, laboratory and other test data, was performed as part of comprehensive evaluation and provision of chronic care management services.   SDOH:  (Social Determinants of Health) assessments and interventions performed:     CCM Care Plan  Allergies  Allergen Reactions   Prednisone Other (See Comments)     REACTION: mood swings, nightmares. "Shot doesn't bother me, reaction is just with the pill" she states she has had the steroid injections before. From our records methylprednisone was given to her in 2013 without any complications.    Medications Reviewed Today     Reviewed by [name], RPH-CPP (Pharmacist) on  02/27/22 at [place] List Status: <None>   Medication Order Taking? Sig Documenting Provider Last Dose Status Informant  acetaminophen (TYLENOL) 325 MG tablet [id number] Yes Take 1-2 tablets (325-650 mg total) by mouth every 6 (six) hours as needed for mild pain (pain score 1-3 or temp > 100.5). Georgette Shell, MD Taking Active   albuterol (VENTOLIN HFA) 108 [phone] Base) MCG/ACT inhaler [id number] Yes INHALE 2 PUFFS INTO THE LUNGS EVERY EIGHT HOURS AS NEEDED FOR WHEEZING OR SHORTNESS OF BREATH. Jonetta Osgood, MD Taking Active Self  aspirin EC 81 MG tablet [id number] Yes Take 81 mg by mouth daily. [provider] Taking Active Self  calcium carbonate (TUMS EX) 750 MG chewable tablet [id number] Yes Chew 1 tablet by mouth daily as needed for heartburn. [provider] Taking Active Self  Cholecalciferol (VITAMIN D3) 1000 UNITS CAPS [id number] Yes Take 1,000 Units by mouth daily. [provider] Taking Active Self  dapagliflozin propanediol (FARXIGA) 10 MG TABS tablet [id number] Yes Take 1 tablet (10 mg total) by mouth daily. Shamleffer, Melanie Crazier, MD Taking Active Self  Dulaglutide (TRULICITY) 3 ID/7.8EU SOPN [id number] Yes Inject 3 mg into the skin once a week. [provider] Taking Active Self  fexofenadine (ALLEGRA) 180 MG tablet [id number] Yes Take 180 mg by mouth daily. [provider] Taking Active Self  FLUoxetine (PROZAC) 40 MG capsule [id number] Yes TAKE 1 CAPSULE BY MOUTH EVERY DAY Copland, Gay Filler, MD Taking Active Self  furosemide (LASIX) 20 MG tablet [id number] Yes TAKE 1 TABLET BY MOUTH EVERY DAY Burtis Junes, NP Taking  Active Self  gabapentin (NEURONTIN) 300 MG capsule [id number] Yes TAKE 2 CAPSULES BY MOUTH TWICE A DAY Copland, Gay Filler, MD Taking Active Self  hydrALAZINE (APRESOLINE) 25 MG tablet [id number] Yes TAKE 2 TABLETS BY MOUTH 2 TIMES DAILY. FOR BLOOD PRESSURE Copland, Gay Filler, MD Taking Active   Insulin Glargine (BASAGLAR KWIKPEN) 100 UNIT/ML [id number] Yes Inject 30 Units into the skin daily. Profile until patient requests Copland, Gay Filler, MD Taking Active            Med Note ([place] Feb 27, 2022  9:06 AM) Patient is only taking 3 times per week.  Insulin Pen Needle 32G X 4 MM MISC [id number] Yes 1 Device by Does not apply route daily. Shamleffer, Melanie Crazier, MD Taking Active Self  metFORMIN (GLUCOPHAGE-XR) 500 MG 24 hr tablet [id number] Yes TAKE 2 TABLETS BY MOUTH TWICE A DAY Copland, Gay Filler, MD Taking Active Self  metoprolol succinate (TOPROL-XL) 100 MG 24 hr tablet [id number] Yes TAKE 1 TABLET BY MOUTH EVERY DAY Copland, Gay Filler, MD Taking Active   montelukast (SINGULAIR) 10 MG tablet [id number] Yes Take 1 tablet (10 mg total) by mouth at bedtime. For allergies / asthma Copland, Gay Filler, MD Taking Active Self  pantoprazole (PROTONIX) 40 MG tablet [id number] Yes Take 1 tablet (40 mg total) by mouth daily. Copland, Gay Filler, MD Taking Active   rosuvastatin (CRESTOR) 20 MG tablet [id number] Yes Take 1 tablet (20 mg total) by mouth daily. For cholesterol / heart Copland, Gay Filler, MD Taking Active Self  Tiotropium Bromide Monohydrate (SPIRIVA RESPIMAT) 2.5 MCG/ACT AERS [id number] Yes INHALE 2 PUFFS BY MOUTH INTO THE LUNGS DAILY Copland, Gay Filler, MD Taking Active   valsartan (DIOVAN) 160 MG tablet [id number] Yes Take 0.5 tablets (80 mg total) by mouth daily.  Patient taking differently: Take 160 mg by mouth daily.   Georgette Shell,  MD Taking Active   zinc gluconate 50 MG tablet [id number] No Take 50 mg by mouth daily.  Patient not taking: Reported on 02/27/2022   [provider] Not Taking Active Self  Med List Note (Copland, Gay Filler, MD 03/12/21 1526): Lorazepam rarely.            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   Tinnitus of both ears 11/18/2019   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   Eustachian tube dysfunction, bilateral 11/18/2019   Chronic eczematous otitis externa of both ears 11/18/2019   Obesity (BMI 30-39.9) 08/24/2018   Depression with anxiety 08/24/2018   CAD (coronary artery disease) 08/24/2018   Abnormal cardiovascular stress test 08/24/2018   Precordial chest pain    Sepsis ([place]) 09/18/2017   Chronic diastolic CHF (congestive heart failure) ([place]) 09/18/2017   Chronic rhinitis 08/07/2016   COPD with acute exacerbation ([place]) 04/25/2016   Hypertension associated with diabetes ([place]) 01/30/2016   OSA (obstructive sleep apnea) 05/06/2014   Dyspnea on exertion 09/02/2012   GERD 06/28/2008    Immunization History  Administered Date(s) Administered   Fluad Quad(high Dose 65+) 09/04/2020   Influenza Whole 04/14/2010   Influenza, High Dose Seasonal PF 04/03/2017, 05/11/2018, 03/05/2019, 03/12/2021   Influenza,inj,Quad PF,6+ Mos 05/10/2013, 04/05/2014, 05/22/2015, 04/25/2016   PFIZER Comirnaty(Gray Top)Covid-19 Tri-Sucrose Vaccine 01/11/2021, 02/01/2021   Pneumococcal Conjugate-13 07/24/2016   Pneumococcal Polysaccharide-23 04/05/2014, 05/22/2015   Tdap  07/11/2014, 03/05/2019   Zoster Recombinat (Shingrix) 03/05/2019, 06/05/2019    Conditions to be addressed/monitored: CAD, HTN, HLD, COPD, DMII, and Depression  Care Plan : General Pharmacy (Adult)  Updates made by [name], RPH-CPP since 02/27/2022 12:00 AM     Problem: Medication Adherence (Wellness)      Long-Range Goal: Medication Adherence Maintained   Recent Progress: On track  Note:   Current Barriers:  Unable to independently afford treatment regimen Unable to achieve control of type 2 DM, HTN   Pharmacist Clinical Goal(s):  Over the next 90 days, patient will achieve adherence to monitoring guidelines and medication adherence to achieve therapeutic efficacy achieve control of diabetes and HTN as evidenced by meeting goal as described in patient's care plan,  through collaboration with PharmD and provider.   Interventions: 1:1 collaboration with Copland, Gay Filler, MD regarding development and update of comprehensive plan of care as evidenced by provider attestation and co-signature Inter-disciplinary care team collaboration (see longitudinal plan of care) Comprehensive medication review performed; medication list updated in electronic medical record  Diabetes: Not at goal but improving; Goal A1c is < 7.0% Weight was 178 lbs in February 2022 and down to 165 (11/12/2021) - has lost about 13 lbs in the last year.  Current treatment: Farxiga 1m daily Basaglar 30 units daily Trulicity 345mweekly (increased 02/2021) metformin ER 50062m take 2 tablets twice a day Patient reports she is not skipping Basaglar now Current glucose readings: 90 to  150; last blood glucose was 113. Denies blood glucose < 80 or > 200 in the last month. Denies hypoglycemic/hyperglycemic symptoms In past had loose stools that could be related to metformin but today patient states she has not had diarrhea since colonoscopy. Will continue to monitor as could be potential side effect of  metformin.  Interventions:  Reviewed medication adherence and importance of taking all medications as prescribed by Dr Kelton Pillar.  Reviewed signs and symptoms of hypoglycemia and how to treat.  Reviewed 2023 formulary for Mail Carriers policy. [facility]40 per 90 days. (Addressed at previous visit) [facility].  Hypertension / CHF: Controlled; BP goal 140/90 Current treatment:  hydralazine 53m  - take 2 tablets = 541mtwice a day furosemide 2042maily (only taking as needed) Valsartan 160m77mily  metoprolol succinate 100mg18mly. Farxiga 10mg 50my  Current home readings: patient did not have numbers to report Reports trace edema - will elevated legs or take furosemide if needed. Denies SOB.  Weight has been decreasing. Interventions:  Reviewed blood pressure  goals and importance of obtaining blood pressure goals to decrease risk of stroke and renal disease.  Discussed signs and symptoms of CHF exacerbation - weight gain, SOB, abdominal fullness, swelling in legs or abdomen, Fatigue and weakness, changes in ability to perform usual activities, persistent cough or wheezing with white or pink blood-tinged mucus, nausea and lack of appetite Continue to weigh daily - report weight gain of more than 3 lbs in 24 hours or 5 lbs in 1 week.  COPD: Controlled; Goal: prevent exacerbations and hospitalizations     Current treatment:  Spiriva 2.5mg - 73mnhalation once a day  Albuterol inhaler as needed for shortness of breath and wheezing;  montelukast 10mg da16mat bedtime;  one exacerbation requiring treatment in the last 12 months (related to COVID) ACullmaned with cost of Spiriva in early 2023. Patient reports today she was able to get 3 inhalers for $40! Uses rescue inhaler - once per month Maintenance inhaler - restarted daily use Intervention:  Continue to use Spiriva daily Continue to use albuterol as  needed  Depression with Anxiety:  Controlled Current therapy Trazodone 50mg - t25m0.5 tablet = 25mg at b30mme as needed (patient reports she rarely takes) Fluoxetine 40mg daily38mommend continue current regimen for depression and anxiety.  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 Reviewed current branded medications. Identified discount cards and provided to patient.   [facility] [place] Interventions:  Provided  discount cards for Spiriva, TrulFulton Mole Basaglar  AssEastoned needed refills from pharmacy - metoprolol, rosuvastatin, montelukast, Trulicity, Faxiga, metoformin, hydralazine and Basaglar.    Patient self care activities - Over the next 90 days patient will: Take Medications as recommended Contact provider's office or clinical pharmacist if you have an problems of questions about medications or medication costs.  [facility] - [phone] for follow up appointment with Dr Copland for JLorelei Pont3 - [phone] Continue to check blood glucose daily:  Home blood glucose  goals  Fasting blood glucose goal (before meals) = 80 to 130 Blood glucose goal after a meal = less than 180       Medication Assistance: Provided coupons to lower copay for Spiriva, Farxiga, Trulicity and Engineer, agricultural (addressed at previous visit)   [id number] [place] [id number]: [phone]:  [phone]   Follow Up:  Patient agrees to Care Plan and Follow-up.  Plan: Telephone follow up appointment with care management team member scheduled for:  1 to 2 months.   TCherre Robins PharmD Clinical Pharmacist [facility]

## 2022-02-28 DIAGNOSIS — M6281 Muscle weakness (generalized): Secondary | ICD-10-CM | POA: Diagnosis not present

## 2022-02-28 DIAGNOSIS — S52501D Unspecified fracture of the lower end of right radius, subsequent encounter for closed fracture with routine healing: Secondary | ICD-10-CM | POA: Diagnosis not present

## 2022-02-28 MED ORDER — FUROSEMIDE 20 MG PO TABS: 20.0000 mg | ORAL_TABLET | Freq: Every day | ORAL | 1 refills | Status: DC

## 2022-02-28 MED ORDER — ALBUTEROL SULFATE HFA 108 (90 BASE) MCG/ACT IN AERS: INHALATION_SPRAY | RESPIRATORY_TRACT | 0 refills | Status: DC

## 2022-02-28 NOTE — Patient Instructions (Signed)
Destiny Ballard It was a pleasure speaking with you today.  Below is a summary of your health goals and summary of our recent visit. You can also view your updated Chronic Care Management Care plan through your MyChart account.    Patient self care activities - Over the next 90 days patient will: Take Medications as recommended Make sure to take Basaglar insulin every day (only hold if blood glucose is < 80) Lower dose of hydralazine to take '25mg'$  twice a day to see if dizzy episode improves Contact provider's office or clinical pharmacist if you have an problems of questions about medications or medication costs.  Continue to check blood glucose daily:  Home blood glucose goals  Fasting blood glucose goal (before meals) = 80 to 130 Blood glucose goal after a meal = less than 180     As always if you have any questions or concerns especially regarding medications, please feel free to contact me either at the phone number below or with a MyChart message.   Keep up the good work!  Destiny Ballard, PharmD Clinical Pharmacist [place] [phone] (direct line)  [phone] (main office number)   Patient verbalizes understanding of instructions and care plan provided today and agrees to view in [place]. Active MyChart status and patient understanding of how to access instructions and care plan via MyChart confirmed with patient.

## 2022-03-01 ENCOUNTER — Ambulatory Visit (INDEPENDENT_AMBULATORY_CARE_PROVIDER_SITE_OTHER): Payer: Medicare Other | Admitting: Internal Medicine

## 2022-03-01 ENCOUNTER — Encounter: Payer: Self-pay | Admitting: Internal Medicine

## 2022-03-01 VITALS — BP 130/70 | HR 71 | Ht 59.0 in | Wt 165.6 lb

## 2022-03-01 DIAGNOSIS — E119 Type 2 diabetes mellitus without complications: Secondary | ICD-10-CM | POA: Insufficient documentation

## 2022-03-01 DIAGNOSIS — E1142 Type 2 diabetes mellitus with diabetic polyneuropathy: Secondary | ICD-10-CM | POA: Diagnosis not present

## 2022-03-01 DIAGNOSIS — E1159 Type 2 diabetes mellitus with other circulatory complications: Secondary | ICD-10-CM

## 2022-03-01 DIAGNOSIS — Z794 Long term (current) use of insulin: Secondary | ICD-10-CM

## 2022-03-01 NOTE — Progress Notes (Signed)
Name: Destiny Ballard  Age/ Sex: 71 y.o., female   MRN/ DOB: [id number], 16-Sep-1950     PCP: Darreld Mclean, MD   Reason for Endocrinology Evaluation: Type 2 Diabetes Mellitus  Initial Endocrine Consultative Visit: 12/10/2019    PATIENT IDENTIFIER: Destiny Ballard is a 71 y.o. female with a past medical history of T2Dm, dyslipidemia, CAD , OSA and HTN . The patient has followed with Endocrinology clinic since 12/10/2019 for consultative assistance with management of her diabetes.  DIABETIC HISTORY:  [name] was diagnosed with T2DM many years ago.On her initial visit she was on oral glycemic agents and insulin. Does not recall prior meds.  Her hemoglobin A1c has ranged from 6.2% in 2013, peaking at 10.1% in 2020    On her initial visit to our clinic she had an A1c of 7.8%, she was on metformin, Farxiga, Glipizide and Levemir. We stopped Glipizide, Levemir and started basaglar, continued Metformin and farxiga   Started trulicity 10/8183 SUBJECTIVE:   During the last visit (01/09/2021): A1c 8.3 %. We increased basaglar and Trulicity and Basaglar and continued Metformin and [place].     Today (03/01/2022): Ms. Carrier is here for a follow up on diabetes management. The pt has NOT been to our clinic in 14 months. She checks her blood sugars 1 times daily through the freestyle libre, she uses the strips as the sensors keep falling off  .The patient has not had hypoglycemic episodes since the last clinic visit.    She has been following up with podiatry for left ingrown great toenail She has also been evaluated by GI for diarrhea, the recommendation was to hold metformin she was also started on dicyclomen. Diarrhea has been improving now its 1-2x a day.  She is s/p fall in May 2023, she is s/p ORIF of the right wrist fracture while playing with grandson    HOME DIABETES REGIMEN:  Metformin 500 mg , 2 tablet twice daily  Farxiga 10 mg , 1 tablet daily in the morning   Basaglar 30 units daily  Trulicity 3 mg weekly      Statin: Yes ACE-I/ARB: Yes     CONTINUOUS GLUCOSE MONITORING RECORD INTERPRETATION    Dates of Recording: 8/5-8/18/2023  Sensor description:freestyle libre  In downloading the CGM there was no summary as it appears the pt has been using it for finger stick  BG readings 104-140    DIABETIC COMPLICATIONS: Microvascular complications:  Neuropathy  Denies: CKD, retinopathy  Last eye exam: has an eye exam for 03/08/2021   Macrovascular complications:  CAD (S/P stent placement)  Denies: PVD, CVA   HISTORY:  Past Medical History:  Past Medical History:  Diagnosis Date   Anxiety    Prior suicide attempt   CAD (coronary artery disease)    a) s/p DES to LAD 07/2005 b) Last Myoview low risk 11/2011 showing small fixed apical perfusion defect (prior MI vs attenuation) but no ischemia - normal EF.   Cervical spondylosis    Chest pain 12/10/2011   Chronic diastolic CHF (congestive heart failure) (HCC) 09/18/2017   Chronic eczematous otitis externa of both ears 11/18/2019   Chronic rhinitis 63/14/9702   Complication of anesthesia    developed pneumonia after nerve block was hospitalized   Constipation 12/18/2020   COPD with acute exacerbation ([place]) 04/25/2016   Coronary atherosclerosis 06/28/2008   Depression    Depression with anxiety    Diabetes mellitus ([place]) 09/08/2017   Diabetic neuropathy ([place])  Dyspnea on exertion 09/02/2012   CXR 07/2012:  No acute process.     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 EF 60%   CARDIAC CATHETERIZATION N/A 01/29/2016   Procedure: Left Heart Cath and Coronary Angiography;  Surgeon: Sherren Mocha, MD;  Location: [facility];  Service: Cardiovascular;  Laterality: N/A;   CERVICAL SPONDYLOSIS     SINGLE LEVEL FUSION   CHILDBIRTH     X3   COLONOSCOPY  2010   normal   COLONOSCOPY WITH PROPOFOL  10/01/2021   CORONARY STENT PLACEMENT  07/15/2005   LEFT ANTERIOR DESCENDING   FOREARM FRACTURE SURGERY  07/15/2008   hand and shoulder    INCISION AND DRAINAGE BREAST ABSCESS  01/05/2012       INCISION AND DRAINAGE PERIRECTAL ABSCESS N/A 02/18/2014   Procedure: IRRIGATION AND DEBRIDEMENT PERIRECTAL ABSCESS;  Surgeon: Pedro Earls, MD;  Location: [facility];  Service: General;  Laterality: N/A;   LEFT HEART CATH AND CORONARY ANGIOGRAPHY N/A 10/10/2017   Procedure: LEFT HEART CATH AND CORONARY ANGIOGRAPHY;  Surgeon: Burnell Blanks, MD;  Location: [facility];  Service: Cardiovascular;  Laterality: N/A;   LUMBAR LAMINECTOMY     ORIF WRIST FRACTURE Right 11/27/2021    Procedure: OPEN REDUCTION INTERNAL FIXATION (ORIF) WRIST FRACTURE;  Surgeon: Marchia Bond, MD;  Location: [facility];  Service: Orthopedics;  Laterality: Right;   ROTATOR CUFF REPAIR     bilaterla   TONSILLECTOMY AND ADENOIDECTOMY     TUBAL LIGATION     VIDEO BRONCHOSCOPY Bilateral 08/13/2016   Procedure: VIDEO BRONCHOSCOPY WITHOUT FLUORO;  Surgeon: Collene Gobble, MD;  Location: [facility];  Service: Cardiopulmonary;  Laterality: Bilateral;   Social History:  reports that she has never smoked. She has been exposed to tobacco smoke. She has never used smokeless tobacco. She reports current alcohol use. She reports that she does not use drugs. Family History:  Family History  Problem Relation Age of Onset   Heart attack Mother    Diabetes Mother    Lung cancer Mother    Asthma Mother    Heart disease Mother    Suicidality  Father        "killed himself"   Diabetes Sister    Cancer Sister    Colon cancer Sister 36   Asthma Daughter        x 2   Cancer Daughter        pre-cancerous polyp   Cervical cancer Daughter        cervical    Allergies Other        all family--seasonal allergies     HOME MEDICATIONS: Allergies as of 03/01/2022       Reactions   Prednisone Other (See Comments)   REACTION: mood swings, nightmares. "Shot doesn't bother me, reaction is just with the pill" she states she has had the steroid injections before. From our records methylprednisone was given to her in 2013 without any complications.        Medication List        Accurate as of March 01, 2022  2:43 PM. If you have any questions, ask your nurse or doctor.          STOP taking these medications    acetaminophen 325 MG tablet Commonly known as: TYLENOL Stopped by: Dorita Sciara, MD       TAKE these medications    albuterol 108 (90 Base) MCG/ACT inhaler Commonly known as: VENTOLIN HFA INHALE 2 PUFFS INTO THE LUNGS EVERY EIGHT HOURS AS NEEDED FOR WHEEZING OR SHORTNESS OF  BREATH.   aspirin EC 81 MG tablet Take 81 mg by mouth daily.   Basaglar KwikPen 100 UNIT/ML Inject 30 Units into the skin daily. Profile until patient requests   calcium carbonate 750 MG chewable tablet Commonly known as: TUMS EX Chew 1 tablet by mouth daily as needed for heartburn.   dapagliflozin propanediol 10 MG Tabs tablet Commonly known as: Farxiga Take 1 tablet (10 mg total) by mouth daily.   fexofenadine 180 MG tablet Commonly known as: ALLEGRA Take 180 mg by mouth daily.   FLUoxetine 40 MG capsule Commonly known as: PROZAC TAKE 1 CAPSULE BY MOUTH EVERY DAY   furosemide 20 MG tablet Commonly known as: LASIX Take 1 tablet (20 mg total) by mouth daily. For swelling / fluid retention   gabapentin 300 MG capsule Commonly known as: NEURONTIN TAKE 2 CAPSULES BY MOUTH TWICE A DAY   hydrALAZINE 25 MG tablet Commonly known as: APRESOLINE TAKE 2 TABLETS BY MOUTH 2 TIMES DAILY. FOR BLOOD PRESSURE   Insulin Pen Needle 32G X 4 MM Misc 1 Device by Does not apply route daily.   metFORMIN 500 MG 24 hr tablet Commonly known as: GLUCOPHAGE-XR TAKE 2 TABLETS BY MOUTH TWICE A DAY   metoprolol succinate 100 MG 24 hr tablet Commonly known as: TOPROL-XL TAKE 1 TABLET BY MOUTH EVERY DAY   montelukast 10 MG tablet Commonly known as: SINGULAIR Take 1 tablet (10 mg total) by mouth at bedtime. For allergies / asthma   pantoprazole 40 MG tablet Commonly known as: PROTONIX Take 1 tablet (40 mg total) by mouth daily.   rosuvastatin 20 MG tablet Commonly known as: CRESTOR Take 1 tablet (20 mg total) by mouth daily. For cholesterol / heart   Spiriva Respimat 2.5 MCG/ACT Aers Generic drug: Tiotropium Bromide Monohydrate INHALE 2 PUFFS BY MOUTH INTO THE LUNGS DAILY   Trulicity 3 HQ/4.6NG Sopn Generic drug: Dulaglutide Inject 3 mg into the skin once a week.   valsartan 160 MG tablet Commonly known as: DIOVAN Take 0.5 tablets (80 mg total) by mouth daily. What  changed: how  much to take   Vitamin D3 25 MCG (1000 UT) Caps Take 1,000 Units by mouth daily.   zinc gluconate 50 MG tablet Take 50 mg by mouth daily.         OBJECTIVE:   Vital Signs: BP 130/70 (BP Location: Left Arm, Patient Position: Sitting, Cuff Size: Small)   Pulse 71   Ht '4\' 11"'$  (1.499 m)   Wt 165 lb 9.6 oz (75.1 kg)   SpO2 99%   BMI 33.45 kg/m   Wt Readings from Last 3 Encounters:  03/01/22 165 lb 9.6 oz (75.1 kg)  02/13/22 164 lb 3.2 oz (74.5 kg)  11/27/21 165 lb (74.8 kg)     Exam: General: Pt appears well and is in NAD  Lungs: Clear with good BS bilat with no rales, rhonchi, or wheezes  Heart: RRR with systolic murmur  Extremities: No pretibial edema.   Neuro: MS is good with appropriate affect, pt is alert and Ox3    DM foot exam: 03/01/2022   The skin of the feet is intact without sores or ulcerations. The pedal pulses are 2+ on right and 2+ on left. The sensation is decreased  to a screening 5.07, 10 gram monofilament bilaterally   DATA REVIEWED:  Lab Results  Component Value Date   HGBA1C 7.2 (H) 02/13/2022   HGBA1C 7.0 (H) 11/27/2021   HGBA1C 7.3 (H) 08/22/2021    Latest Reference Range & Units 02/13/22 15:31  Alkaline Phosphatase 39 - 117 U/L 87  Albumin 3.5 - 5.2 g/dL 4.1  Lipase 11.0 - 59.0 U/L 51.0  AST 0 - 37 U/L 16  ALT 0 - 35 U/L 16  Total Protein 6.0 - 8.3 g/dL 7.1  Total Bilirubin 0.2 - 1.2 mg/dL 0.3  GFR >60.00 mL/min 89.84   ASSESSMENT / PLAN / RECOMMENDATIONS:   1) Type 2 Diabetes Mellitus, improving glycemic control , With neuropathic and macrovascular  complications - Most recent A1c of 7.2 %. Goal A1c < 7.0 %.      - A1c continues to improve  - She is not using the sensors for freestyle libre but would rather use it for fingerstick. I offered to switch her to a regular meter but she would like to use freestyle libre  - She has chronic diarrhea, this was prior to starting Trulicity, GI entertained Metformin as a cause  . I have  asked her to reduce the metformin dose by 50% and if it improved her diarrhea to stay on the 2 tabs daily but if no change in diarrhea she may increase back to 2 tabs BID    MEDICATIONS:  - Continue Metformin 500 mg , 2 tablet twice daily  - Continue  Farxiga 10 mg , 1 tablet daily in the morning  - Continue Basaglar 30  units daily  - Continue Trulicity 1.5  mg weekly     EDUCATION / INSTRUCTIONS: BG monitoring instructions: Patient is instructed to check her blood sugars 2 times a day Call [facility] if: BG persistently < 70 I reviewed the Rule of 15 for the treatment of hypoglycemia in detail with the patient. Literature supplied.  2) Diabetic complications:  Eye: Does not have known diabetic retinopathy.  Neuro/ Feet: Does  have known diabetic peripheral neuropathy .  Renal: Patient does not have known baseline CKD. She   is  on an ACEI/ARB at present.     F/U in 6 months    Signed electronically by: Maretta Bees  Nena Jordan, MD  [facility]  [facility] [address] [place], [place] [address] Phone: [phone] FAX: [phone]   CC: Darreld Mclean, [address] [place] [address] Phone: [phone]  Fax: [phone]  Return to Endocrinology clinic as below: Future Appointments  Date Time Provider [place]  06/17/2022  9:40 AM [facility]

## 2022-03-01 NOTE — Patient Instructions (Signed)
-   Continue  Farxiga 10 mg , 1 tablet daily in the morning  - Continue Basaglar 30 units daily  - Continue Trulicity 3 mg weekly  - Take metformin 500 mg, TWO tablets in the morning ( skip evenings) for 2 weeks, if no change in diarrhea increase back to two tablets in the morning and 2 tablets in the evening     HOW TO TREAT LOW BLOOD SUGARS (Blood sugar LESS THAN 70 MG/DL) Please follow the RULE OF 15 for the treatment of hypoglycemia treatment (when your (blood sugars are less than 70 mg/dL)   STEP 1: Take 15 grams of carbohydrates when your blood sugar is low, which includes:  3-4 GLUCOSE TABS  OR 3-4 OZ OF JUICE OR REGULAR SODA OR ONE TUBE OF GLUCOSE GEL    STEP 2: RECHECK blood sugar in 15 MINUTES STEP 3: If your blood sugar is still low at the 15 minute recheck --> then, go back to STEP 1 and treat AGAIN with another 15 grams of carbohydrates.

## 2022-03-03 ENCOUNTER — Encounter: Payer: Self-pay | Admitting: Family Medicine

## 2022-03-04 DIAGNOSIS — S52501D Unspecified fracture of the lower end of right radius, subsequent encounter for closed fracture with routine healing: Secondary | ICD-10-CM | POA: Diagnosis not present

## 2022-03-04 DIAGNOSIS — M6281 Muscle weakness (generalized): Secondary | ICD-10-CM | POA: Diagnosis not present

## 2022-03-06 DIAGNOSIS — S52501D Unspecified fracture of the lower end of right radius, subsequent encounter for closed fracture with routine healing: Secondary | ICD-10-CM | POA: Diagnosis not present

## 2022-03-11 DIAGNOSIS — M6281 Muscle weakness (generalized): Secondary | ICD-10-CM | POA: Diagnosis not present

## 2022-03-11 DIAGNOSIS — S52501D Unspecified fracture of the lower end of right radius, subsequent encounter for closed fracture with routine healing: Secondary | ICD-10-CM | POA: Diagnosis not present

## 2022-03-14 DIAGNOSIS — E1159 Type 2 diabetes mellitus with other circulatory complications: Secondary | ICD-10-CM | POA: Diagnosis not present

## 2022-03-14 DIAGNOSIS — I5030 Unspecified diastolic (congestive) heart failure: Secondary | ICD-10-CM

## 2022-03-14 DIAGNOSIS — J449 Chronic obstructive pulmonary disease, unspecified: Secondary | ICD-10-CM

## 2022-03-14 DIAGNOSIS — I110 Hypertensive heart disease with heart failure: Secondary | ICD-10-CM

## 2022-03-14 DIAGNOSIS — Z794 Long term (current) use of insulin: Secondary | ICD-10-CM | POA: Diagnosis not present

## 2022-03-14 DIAGNOSIS — E785 Hyperlipidemia, unspecified: Secondary | ICD-10-CM | POA: Diagnosis not present

## 2022-03-14 DIAGNOSIS — I2510 Atherosclerotic heart disease of native coronary artery without angina pectoris: Secondary | ICD-10-CM | POA: Diagnosis not present

## 2022-03-20 DIAGNOSIS — M6281 Muscle weakness (generalized): Secondary | ICD-10-CM | POA: Diagnosis not present

## 2022-03-20 DIAGNOSIS — S52501D Unspecified fracture of the lower end of right radius, subsequent encounter for closed fracture with routine healing: Secondary | ICD-10-CM | POA: Diagnosis not present

## 2022-03-24 ENCOUNTER — Telehealth: Payer: Self-pay | Admitting: Family Medicine

## 2022-03-24 NOTE — Telephone Encounter (Signed)
Called pt and LMOM- please check my chart messages

## 2022-04-03 ENCOUNTER — Ambulatory Visit: Payer: Medicare Other | Admitting: Pharmacist

## 2022-04-03 DIAGNOSIS — Z794 Long term (current) use of insulin: Secondary | ICD-10-CM

## 2022-04-03 MED ORDER — METFORMIN HCL ER 500 MG PO TB24: 1000.0000 mg | ORAL_TABLET | Freq: Every day | ORAL | 1 refills | Status: DC

## 2022-04-03 MED ORDER — VALSARTAN 160 MG PO TABS: 160.0000 mg | ORAL_TABLET | Freq: Every day | ORAL | 1 refills | Status: DC

## 2022-04-03 NOTE — Progress Notes (Signed)
Pharmacy Note  04/03/2022 Name: Destiny Ballard MRN: [id number] DOB: [birth year]  Subjective: Destiny Ballard is a 71 y.o. year old female who is a primary care patient of Copland, Gay Filler, MD. Referred to Clinical Pharmacist Practitioner for medication management, CHF and diabetes management   Engaged with patient by telephone for follow up visit in response to provider referral for pharmacy case management and/or care coordination services.   Diabetes: Patient was seen by Dr Kelton Pillar 03/01/2022. Dose of metformin was decresaed form '1000mg'$  twice daily to '1000mg'$  once daily due to GI intolerance. Patient reports today that nausea and diarrhea have improved with lower dose of metformin. Patient reports home blood glucose has been 100 to 139. Improving. Current treatment: Farxiga '10mg'$  daily, Basaglar 30 units daily (patient reports she is not taking every day - Patient reports she has been skipping Basaglar again - usually holds when blood glucose is around 259 or less), Trulicity '3mg'$  weekly  metformin ER '500mg'$  - take 2 tablets once a day. Hypertension / CHF: Controlled - blood pressure in office 02/13/2022 was 120/60. Home blood pressure 100 to 134 / 60's. Current therapy: hydralazine '25mg'$  - 2 tablets = '50mg'$  twice a day (dose lowered from '50mg'$  twice a day to '25mg'$  twice a day during hospitalization 11/2021 but patient restarted previous dose when she returned home), furosemide '20mg'$  daily (only taking as needed), Valsartan '160mg'$  daily (dose was lowered to '80mg'$  or 0.5 tablet daily during hospitalization 11/2021 but patient restarted previous dose when she returned home), metoprolol succinate '100mg'$  daily, Farxiga '10mg'$  daily. Reports edema has improved since she restarted furosemide. She also reports that dizziness she mentioned at last visit has improved.  COPD: Controlled; Goal: prevent exacerbations and hospitalizations. At our last visit she had reported she was having difficulty using Spiriva  Respimat after wrist fracture / surgery. Discussed change to Handihaler - but she decided to continue Respimat. Today she reports since wrist has healed mobility has improved. She is no longer having problems with using Spiriva Respimat.  Osteopenia:  Recent fracture in May 2023 of L2 and wrist after a fall while playing with her grandson. Fell on hard surface from standing.  Dexa results form 12/08/203: AP Spine T-Score = -1.1; Hip T-Score = -2.0; FRAX estimate = 18.4% of major osteoporotic fracture and 3.8% hip fracture.     Patient Care Team: Copland, Gay Filler, MD as PCP - General (Family Medicine) Brien Few, MD as Consulting Physician (Obstetrics and Gynecology) [name], RPH-CPP (Pharmacist)   Recent office visits: 02/13/2022 - Fam Med (Dr Lorelei Pont) Seen for type 2 DM follow up and nausea with sometimes vomiting. Started Pantopraozle '40mg'$  daily. GI consult ordered. Lipase was normal (upper end of normal)  11/12/2021 - Fam Med (Dr Lorelei Pont) Acute visit for skin issue in gluteal cleft. Recommended barrier cream like desitin 3 to 4 times daily. F/U 2 months for labs. Removed dicyclomine from med list - patient not taking 08/20/2021 - PCP (Dr Lorelei Pont) Seen for follow up of chronic conditions and pain over her tailbone for 3 months after fall on step. Declined mammogram; referred to GI for colonoscopy. Labs and xray of coccyn ordered.      Recent consult visits: 03/01/2022 - Endo (Dr Kelton Pillar) F/U diabetes. Lowered dose of metformin by 50% due to possible GI side effect / diarrhea. new dose is '500mg'$  1 tablet twice a day. F/U 6 months.  11/15/2021 - Podiatry (Dr Sherryle Lis) F/U ingrown left great toenail. Noted to be healing well.  Can discontinue soaks and ointments.  10/16/2021 - Podiatry (Dr Sherryle Lis) Ingrown left great toenail. Excised ingrown toenail. Prescribed cefadroxi l'500mg'$  bid and triple antibiotic to nail beds twice a day after soaks.     Hospital visits: [facility]27[facility]. Patient has planned procedure of right distal radius ORIF but in PACU patient was not able to be weaned from O2 after anesthesia therefore she was admitted. Discharged on 2L of O2.  Medications stopped at discharge: diphenhydramine and metoprolol succinate '100mg'$ .  Medications STARTed at discharge: acetaminophen (TYLENOL) and bisacodyl (DULCOLAX) Medication CHANGed at discharge:  hydrALAZINE (APRESOLINE) '25mg'$  - dose decreaed from '50mg'$  bid to '25mg'$  bid valsartan (DIOVAN) '160mg'$  - dose decreaased to 0.5 tablet = '80mg'$  daily   11/21/2021 - ED Visit at [facility] for fall. Pain in right wrist and hip. X-Ray right hip. No evidence of fracture. Right impacted wrist fracture. Splinted wrist. Prescribed oxycodone/APAP 5/320 every 6 hours as needed #10; F/U with Dr Mardelle Matte.    Assessment:  Review of patient status, including review of consultants reports, laboratory and other test data, was performed as part of comprehensive evaluation and provision of chronic care management services.   SDOH (Social Determinants of Health) assessments and interventions performed:  SDOH Interventions    Flowsheet Row Chronic Care Management from 08/20/2021 in [facility] at [place] from 06/14/2021 in [place] at [place] Management from 03/12/2021 in [place] at [place] Management from 02/26/2021 in [place] at [place] Management from 10/02/2020 in [place] at [place] Management from 08/30/2020 in [place] at [place] Interventions        Food Insecurity Interventions -- -- -- -- -- Intervention Not Indicated  Housing Interventions -- Other (Comment)  [Community resource referral] -- -- -- --   Transportation Interventions -- -- -- -- -- Intervention Not Indicated  Depression Interventions/Treatment  -- -- -- Medication, Currently on Treatment  [declines LCSW referral at this time.] -- Currently on Treatment  Financial Strain Interventions --  [providing discount cards for branded meds.] -- -- -- Other (Comment)  [CCM Clinical pharamcist working with patient to identify cost assistance through discount cards since she has commercial pharmacy benefits and if needed and eligible, through manufacturer assisntace program] --  Physical Activity Interventions -- -- Other (Comments)  [Recommended trying to walk daily - start with 5 or 10 minutes and increase as able] -- -- --        Objective:  Lab Results  Component Value Date   CREATININE 0.59 02/13/2022   CREATININE 0.56 11/29/2021   CREATININE 0.58 11/28/2021    Lab Results  Component Value Date   HGBA1C 7.2 (H) 02/13/2022       Component Value Date/Time   CHOL 130 01/01/2021 1144   TRIG 266 (H) 01/01/2021 1144   HDL 44 01/01/2021 1144   CHOLHDL 3.0 01/01/2021 1144   CHOLHDL 3 09/09/2019 1128   VLDL 72.6 (H) 09/09/2019 1128   LDLCALC 45 01/01/2021 1144   LDLDIRECT 64.0 09/09/2019 1128     BP Readings from Last 3 Encounters:  03/01/22 130/70  02/13/22 120/60  11/29/21 (!) 111/50      Allergies  Allergen Reactions   Prednisone Other (See Comments)    REACTION: mood swings, nightmares. "Shot  doesn't bother me, reaction is just with the pill" she states she has had the steroid injections before. From our records methylprednisone was given to her in 2013 without any complications.    Medications Reviewed Today     Reviewed by [name], RPH-CPP (Pharmacist) on 04/03/22 at 1016  Med List Status: <None>   Medication Order Taking? Sig Documenting Provider Last Dose Status Informant  albuterol (VENTOLIN HFA) 108 (90 Base) MCG/ACT inhaler [id number] Yes INHALE 2 PUFFS INTO THE LUNGS EVERY EIGHT HOURS AS NEEDED  FOR WHEEZING OR SHORTNESS OF BREATH. Copland, Gay Filler, MD Taking Active   aspirin EC 81 MG tablet [id number] Yes Take 81 mg by mouth daily. [provider] Taking Active Self  calcium carbonate (TUMS EX) 750 MG chewable tablet [id number] Yes Chew 1 tablet by mouth daily as needed for heartburn. [provider] Taking Active Self  Cholecalciferol (VITAMIN D3) 1000 UNITS CAPS [id number] Yes Take 1,000 Units by mouth daily. [provider] Taking Active Self  dapagliflozin propanediol (FARXIGA) 10 MG TABS tablet [id number] Yes Take 1 tablet (10 mg total) by mouth daily. Shamleffer, Melanie Crazier, MD Taking Active Self  Dulaglutide (TRULICITY) 3 DZ/3.2DJ SOPN [id number] Yes Inject 3 mg into the skin once a week. [provider] Taking Active Self  fexofenadine (ALLEGRA) 180 MG tablet [id number] Yes Take 180 mg by mouth daily. [provider] Taking Active Self  FLUoxetine (PROZAC) 40 MG capsule [id number] Yes TAKE 1 CAPSULE BY MOUTH EVERY DAY Copland, Gay Filler, MD Taking Active Self  furosemide (LASIX) 20 MG tablet [id number] Yes Take 1 tablet (20 mg total) by mouth daily. For swelling / fluid retention Copland, Gay Filler, MD Taking Active   gabapentin (NEURONTIN) 300 MG capsule [id number] Yes TAKE 2 CAPSULES BY MOUTH TWICE A DAY Copland, Gay Filler, MD Taking Active Self  hydrALAZINE (APRESOLINE) 25 MG tablet [id number] Yes TAKE 2 TABLETS BY MOUTH 2 TIMES DAILY. FOR BLOOD PRESSURE Copland, Gay Filler, MD Taking Active   Insulin Glargine (BASAGLAR KWIKPEN) 100 UNIT/ML [id number] Yes Inject 30 Units into the skin daily. Profile until patient requests Copland, Gay Filler, MD Taking Active            Med Note Antony Contras, [place] Apr 03, 2022 10:15 AM) Taking every other day  Insulin Pen Needle 32G X 4 MM MISC [id number] Yes 1 Device by Does not apply route daily. Shamleffer, Melanie Crazier, MD Taking Active Self  metFORMIN (GLUCOPHAGE-XR) 500 MG 24 hr tablet [id number]  Yes TAKE 2 TABLETS BY MOUTH TWICE A DAY  Patient taking differently: 2 tablets daily with breakfast.   Copland, Gay Filler, MD Taking Active Self  metoprolol succinate (TOPROL-XL) 100 MG 24 hr tablet [id number] Yes TAKE 1 TABLET BY MOUTH EVERY DAY Copland, Gay Filler, MD Taking Active   montelukast (SINGULAIR) 10 MG tablet [id number] Yes Take 1 tablet (10 mg total) by mouth at bedtime. For allergies / asthma Copland, Gay Filler, MD Taking Active Self  pantoprazole (PROTONIX) 40 MG tablet [id number] Yes Take 1 tablet (40 mg total) by mouth daily. Copland, Gay Filler, MD Taking Active   rosuvastatin (CRESTOR) 20 MG tablet [id number] Yes Take 1 tablet (20 mg total) by mouth daily. For cholesterol / heart Copland, Gay Filler, MD Taking Active Self  Tiotropium Bromide Monohydrate (SPIRIVA RESPIMAT) 2.5 MCG/ACT AERS [id number] Yes INHALE 2 PUFFS BY MOUTH INTO THE LUNGS DAILY Copland, Gay Filler, MD Taking Active   valsartan (DIOVAN) 160 MG tablet [id number] Yes Take  0.5 tablets (80 mg total) by mouth daily.  Patient taking differently: Take 160 mg by mouth daily.   Georgette Shell, MD Taking Active   zinc gluconate 50 MG tablet [id number] Yes Take 50 mg by mouth daily. [provider] Taking Active Self  Med List Note (Copland, Gay Filler, MD 03/12/21 1526): Lorazepam rarely.            Patient Active Problem List   Diagnosis Date Noted   Diabetes mellitus ([place]) 03/01/2022   Hypoxia 11/27/2021   Osteopenia 06/27/2021   Melena 12/18/2020   History of colonic polyps 12/18/2020   Constipation 12/18/2020   Obesity    Iron deficiency anemia    Insulin resistance    Hyperlipidemia    Cervical spondylosis    Anxiety    Pneumonia due to COVID-19 virus 08/21/2020   Hyperlipidemia associated with type 2 diabetes mellitus ([place]) 08/21/2020   Type 2 diabetes mellitus with diabetic polyneuropathy, with long-term current use of insulin ([place]) 12/10/2019   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   Tinnitus of both ears 11/18/2019   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   Eustachian tube dysfunction, bilateral 11/18/2019   Chronic eczematous otitis externa of both ears 11/18/2019   Obesity (BMI 30-39.9) 08/24/2018   Depression with anxiety 08/24/2018   CAD (coronary artery disease) 08/24/2018   Abnormal cardiovascular stress test 08/24/2018   Precordial chest pain    Sepsis ([place]) 09/18/2017   Chronic diastolic CHF (congestive heart failure) ([place]) 09/18/2017   Chronic rhinitis 08/07/2016   COPD with acute exacerbation ([place]) 04/25/2016   Hypertension associated with diabetes ([place]) 01/30/2016   OSA (obstructive sleep apnea) 05/06/2014   Dyspnea on exertion 09/02/2012   GERD 06/28/2008    Medication Assistance:   Using manufacturer discount cards - Has [employer] pharmacy benefits.   Assessment / Plan:  Diabetes / Obesity: Goal A1c is < 7.0%; Weight was 178 lbs in February 2022 and down to 164 (02/13/2022) - has lost about 14 lbs in the last year. Updated prescription for metformin Er '500mg'$  twice a day. Continue Basaglar, Trulicity.  Hypertension: Updated prescription for valsartan for '160mg'$  daily (patient restarted this dose when she returned from hospital). Continue hydralazine, valsartan and metoprolol COPD: continue to use Spriva Respimat.  Osteoporosis: Fractured wrist in May 2023. Discussed pharmacotherapy treatment for osteoporosis and fracture preventions. Patient is considering and will discuss further with PCP.  Medication Management: reviewed med list. Updated needed prescriptions with recent changes. Discussed adherence.   [name], PharmD Clinical Pharmacist [facility]

## 2022-04-07 NOTE — Patient Instructions (Signed)
Osteoporosis  Osteoporosis is when the bones get thin and weak. This can cause your bones to break (fracture) more easily. What are the causes? The exact cause of this condition is not known. What increases the risk? Having family members with this condition. Not eating enough healthy foods. Taking certain medicines. Being female. Being age 73 or older. Smoking or using other products that contain nicotine or tobacco, such as e-cigarettes or chewing tobacco. Not exercising. Being of European or Asian ancestry. Having a small body frame. What are the signs or symptoms? A broken bone might be the first sign, especially if the break results from a fall or injury that usually would not cause a bone to break. Other signs and symptoms include: Pain in the neck or low back. Being hunched over (stooped posture). Getting shorter. How is this treated? Eating more foods with more calcium and vitamin D in them. Doing exercises. Stopping tobacco use. Limiting how much alcohol you drink. Taking medicines to slow bone loss or help make the bones stronger. Taking supplements of calcium and vitamin D every day. Taking medicines to replace chemicals in the body (hormone replacement medicines). Monitoring your levels of calcium and vitamin D. The goal of treatment is to strengthen your bones and lower your risk for a bone break. Follow these instructions at home: Eating and drinking Eat plenty of calcium and vitamin D. These nutrients are good for your bones. Good sources of calcium and vitamin D include: Some fish, such as salmon and tuna. Foods that have calcium and vitamin D added to them (fortified foods), such as some breakfast cereals. Egg yolks. Cheese. Liver.  Activity Do exercises as told by your doctor. Ask your doctor what exercises are safe for you. You should do: Exercises that make your muscles work to hold your body weight up (weight-bearing exercises). These include tai chi,  yoga, and walking. Exercises to make your muscles stronger. One example is lifting weights. Lifestyle Do not drink alcohol if: Your doctor tells you not to drink. You are pregnant, may be pregnant, or are planning to become pregnant. If you drink alcohol: Limit how much you use to: 0-1 drink a day for women. 0-2 drinks a day for men. Know how much alcohol is in your drink. In the [place] one drink equals one 12 oz bottle of beer (355 mL), one 5 oz glass of wine (148 mL), or one 1 oz glass of hard liquor (44 mL). Do not smoke or use any products that contain nicotine or tobacco. If you need help quitting, ask your doctor. Preventing falls Use tools to help you move around (mobility aids) as needed. These include canes, walkers, scooters, and crutches. Keep rooms well-lit. Put away things on the floor that could make you trip. These include cords and rugs. Install safety rails on stairs. Install grab bars in bathrooms. Use rubber mats in slippery areas, like bathrooms. Wear shoes that: Fit you well. Support your feet. Have closed toes. Have rubber soles or low heels. Tell your doctor about all of the medicines you are taking. Some medicines can make you more likely to fall. General instructions Take over-the-counter and prescription medicines only as told by your doctor. Keep all follow-up visits. Contact a doctor if: You have not been tested (screened) for osteoporosis and you are: A woman who is age 74 or older. A man who is age 35 or older. Get help right away if: You fall. You get hurt. Summary Osteoporosis happens when your  bones get thin and weak. Weak bones can break (fracture) more easily. Eat plenty of calcium and vitamin D. These are good for your bones. Tell your doctor about all of the medicines that you take. This information is not intended to replace advice given to you by your health care provider. Make sure you discuss any questions you have with your health care  provider. Document Revised: 12/16/2019 Document Reviewed: 12/16/2019 Elsevier Patient Education  2023 [employer].  

## 2022-04-26 ENCOUNTER — Other Ambulatory Visit: Payer: Self-pay | Admitting: Podiatry

## 2022-04-26 ENCOUNTER — Other Ambulatory Visit: Payer: Self-pay | Admitting: Family Medicine

## 2022-04-26 ENCOUNTER — Other Ambulatory Visit: Payer: Self-pay | Admitting: Gastroenterology

## 2022-04-26 DIAGNOSIS — R197 Diarrhea, unspecified: Secondary | ICD-10-CM

## 2022-04-26 DIAGNOSIS — J302 Other seasonal allergic rhinitis: Secondary | ICD-10-CM

## 2022-04-26 DIAGNOSIS — F32A Depression, unspecified: Secondary | ICD-10-CM

## 2022-05-11 ENCOUNTER — Other Ambulatory Visit: Payer: Self-pay | Admitting: Family Medicine

## 2022-05-11 DIAGNOSIS — R1013 Epigastric pain: Secondary | ICD-10-CM

## 2022-05-18 NOTE — Progress Notes (Unsigned)
[place] at [facility] [address], [place], [place] [address] [phone]  Date:  05/20/2022   Name:  Destiny Ballard   DOB:  Aug 25, 1950   MRN:  [id number]  PCP:  Darreld Mclean, MD    Chief Complaint: No chief complaint on file.   History of Present Illness:  Destiny Ballard is a 71 y.o. very pleasant female patient who presents with the following:  Pt seen today for a recheck-  history of CAD with cardiology care, hypertension, CHF, sleep apnea, COPD, diabetes, GERD, hyperlipidemia   Last seen by myself in May of this year   Needs urine micro Mammogram Recommend covid booster Eye exam Flu shot   Lab Results  Component Value Date   HGBA1C 7.2 (H) 02/13/2022      Patient Active Problem List   Diagnosis Date Noted   Diabetes mellitus ([place]) 03/01/2022   Hypoxia 11/27/2021   Osteopenia 06/27/2021   Melena 12/18/2020   History of colonic polyps 12/18/2020   Constipation 12/18/2020   Obesity    Iron deficiency anemia    Insulin resistance    Hyperlipidemia    Cervical spondylosis    Anxiety    Pneumonia due to COVID-19 virus 08/21/2020   Hyperlipidemia associated with type 2 diabetes mellitus ([place]) 08/21/2020   Type 2 diabetes mellitus with diabetic polyneuropathy, with long-term current use of insulin ([place]) 12/10/2019   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   Tinnitus of both ears 11/18/2019   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   Eustachian tube dysfunction, bilateral 11/18/2019   Chronic eczematous otitis externa of both ears 11/18/2019   Obesity (BMI 30-39.9) 08/24/2018   Depression with anxiety 08/24/2018   CAD (coronary artery disease) 08/24/2018   Abnormal cardiovascular stress test 08/24/2018   Precordial chest pain    Sepsis ([place]) 09/18/2017   Chronic diastolic CHF (congestive heart failure) ([place]) 09/18/2017   Chronic rhinitis 08/07/2016   COPD  with acute exacerbation ([place]) 04/25/2016   Hypertension associated with diabetes ([place]) 01/30/2016   OSA (obstructive sleep apnea) 05/06/2014   Dyspnea on exertion 09/02/2012   GERD 06/28/2008    Past Medical History:  Diagnosis Date   Anxiety    Prior suicide attempt   CAD (coronary artery disease)    a) s/p DES to LAD 07/2005 b) Last Myoview low risk 11/2011 showing small fixed apical perfusion defect (prior MI vs attenuation) but no ischemia - normal EF.   Cervical spondylosis    Chest pain 12/10/2011   Chronic diastolic CHF (congestive heart failure) ([facility]) 09/18/2017   Chronic eczematous otitis externa of both ears 11/18/2019   Chronic rhinitis 37/04/6268   Complication of anesthesia    developed pneumonia after nerve block was hospitalized   Constipation 12/18/2020   COPD with acute exacerbation ([place]) 04/25/2016   Coronary atherosclerosis 06/28/2008   Depression    Depression with anxiety    Diabetes mellitus ([place]) 09/08/2017   Diabetic neuropathy ([place])    Dyspnea on exertion 09/02/2012   CXR 07/2012:  No acute process.     Eustachian tube dysfunction, bilateral 11/18/2019   GERD 06/28/2008   Hearing loss    Left   History of colonic polyps 12/18/2020   History of kidney stones    HLD (hyperlipidemia)    Hyperlipidemia associated with type 2 diabetes mellitus ([place]) 08/21/2020   Hypertension    Hypertension associated with diabetes ([place]) 01/30/2016  Hypokalemia 01/27/2016   Insulin resistance    Iron deficiency anemia    Leukocytosis    history of mild leukocytosis   Lobar pneumonia, unspecified organism ([place]) 11/13/2017   Melena 12/18/2020   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   Obesity (BMI 30-39.9) 08/24/2018   Last Assessment & Plan:  Exercise and weight reduction is encouraged.   OSA (obstructive sleep apnea) 05/06/2014   Pneumonia due to COVID-19 virus 08/21/2020   Precordial chest pain    Sciatic nerve disease, left     leg   Sepsis ([place]) 09/18/2017   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   Tinnitus of both ears 11/18/2019   Type 2 diabetes mellitus with hyperglycemia, with long-term current use of insulin ([place]) 12/10/2019   Uncontrolled type 2 diabetes mellitus with complication, with long-term current use of insulin 03/14/2020    Past Surgical History:  Procedure Laterality Date   BREAST ENHANCEMENT SURGERY     CARDIAC CATHETERIZATION  06/17/2007   NORMAL. EF 60%   CARDIAC CATHETERIZATION N/A 01/29/2016   Procedure: Left Heart Cath and Coronary Angiography;  Surgeon: Sherren Mocha, MD;  Location: [facility];  Service: Cardiovascular;  Laterality: N/A;   CERVICAL SPONDYLOSIS     SINGLE LEVEL FUSION   CHILDBIRTH     X3   COLONOSCOPY  2010   normal   COLONOSCOPY WITH PROPOFOL  10/01/2021   CORONARY STENT PLACEMENT  07/15/2005   LEFT ANTERIOR DESCENDING   FOREARM FRACTURE SURGERY  07/15/2008   hand and shoulder    INCISION AND DRAINAGE BREAST ABSCESS  01/05/2012       INCISION AND DRAINAGE PERIRECTAL ABSCESS N/A 02/18/2014   Procedure: IRRIGATION AND DEBRIDEMENT PERIRECTAL ABSCESS;  Surgeon: Pedro Earls, MD;  [facility];  Service: General;  Laterality: N/A;   LEFT HEART CATH AND CORONARY ANGIOGRAPHY N/A 10/10/2017   Procedure: LEFT HEART CATH AND CORONARY ANGIOGRAPHY;  Surgeon: Burnell Blanks, MD;  Location: [facility];  Service: Cardiovascular;  Laterality: N/A;   LUMBAR LAMINECTOMY     ORIF WRIST FRACTURE Right 11/27/2021   Procedure: OPEN REDUCTION INTERNAL FIXATION (ORIF) WRIST FRACTURE;  Surgeon: Marchia Bond, MD;  [facility];  Service: Orthopedics;  Laterality: Right;   ROTATOR CUFF REPAIR     bilaterla   TONSILLECTOMY AND ADENOIDECTOMY     TUBAL LIGATION     VIDEO BRONCHOSCOPY Bilateral 08/13/2016   Procedure: VIDEO BRONCHOSCOPY WITHOUT FLUORO;  Surgeon: Collene Gobble, MD;  [facility];  Service: Cardiopulmonary;  Laterality:  Bilateral;    Social History   Tobacco Use   Smoking status: Never    Passive exposure: Yes   Smokeless tobacco: Never  Vaping Use   Vaping Use: Never used  Substance Use Topics   Alcohol use: Yes    Comment: occ   Drug use: No    Family History  Problem Relation Age of Onset   Heart attack Mother    Diabetes Mother    Lung cancer Mother    Asthma Mother    Heart disease Mother    Suicidality Father        "killed himself"   Diabetes Sister    Cancer Sister    Colon cancer Sister 36   Asthma Daughter        x 2   Cancer Daughter        pre-cancerous polyp   Cervical cancer Daughter  cervical    Allergies Other        all family--seasonal allergies    Allergies  Allergen Reactions   Prednisone Other (See Comments)    REACTION: mood swings, nightmares. "Shot doesn't bother me, reaction is just with the pill" she states she has had the steroid injections before. From our records methylprednisone was given to her in 2013 without any complications.    Medication list has been reviewed and updated.  Current Outpatient Medications on File Prior to Visit  Medication Sig Dispense Refill   albuterol (VENTOLIN HFA) 108 (90 Base) MCG/ACT inhaler INHALE 2 PUFFS INTO THE LUNGS EVERY EIGHT HOURS AS NEEDED FOR WHEEZING OR SHORTNESS OF BREATH. 18 g 0   aspirin EC 81 MG tablet Take 81 mg by mouth daily.     calcium carbonate (TUMS EX) 750 MG chewable tablet Chew 1 tablet by mouth daily as needed for heartburn.     Cholecalciferol (VITAMIN D3) 1000 UNITS CAPS Take 1,000 Units by mouth daily.     dapagliflozin propanediol (FARXIGA) 10 MG TABS tablet Take 1 tablet (10 mg total) by mouth daily. 90 tablet 3   Dulaglutide (TRULICITY) 3 CH/8.5ID SOPN Inject 3 mg into the skin once a week.     fexofenadine (ALLEGRA) 180 MG tablet Take 180 mg by mouth daily.     FLUoxetine (PROZAC) 40 MG capsule TAKE 1 CAPSULE BY MOUTH EVERY DAY 90 capsule 1   furosemide (LASIX) 20 MG tablet Take  1 tablet (20 mg total) by mouth daily. For swelling / fluid retention 90 tablet 1   gabapentin (NEURONTIN) 300 MG capsule TAKE 2 CAPSULES BY MOUTH TWICE A DAY 360 capsule 1   hydrALAZINE (APRESOLINE) 25 MG tablet TAKE 2 TABLETS BY MOUTH 2 TIMES DAILY. FOR BLOOD PRESSURE 360 tablet 1   Insulin Glargine (BASAGLAR KWIKPEN) 100 UNIT/ML Inject 30 Units into the skin daily. Profile until patient requests 15 mL 1   Insulin Pen Needle 32G X 4 MM MISC 1 Device by Does not apply route daily. 100 each 3   metFORMIN (GLUCOPHAGE-XR) 500 MG 24 hr tablet Take 2 tablets (1,000 mg total) by mouth daily with breakfast. 180 tablet 1   metoprolol succinate (TOPROL-XL) 100 MG 24 hr tablet TAKE 1 TABLET BY MOUTH EVERY DAY 90 tablet 1   montelukast (SINGULAIR) 10 MG tablet TAKE 1 TABLET (10 MG TOTAL) BY MOUTH AT BEDTIME. FOR ALLERGIES / ASTHMA 90 tablet 1   pantoprazole (PROTONIX) 40 MG tablet TAKE 1 TABLET BY MOUTH EVERY DAY 90 tablet 1   rosuvastatin (CRESTOR) 20 MG tablet Take 1 tablet (20 mg total) by mouth daily. For cholesterol / heart 90 tablet 3   Tiotropium Bromide Monohydrate (SPIRIVA RESPIMAT) 2.5 MCG/ACT AERS INHALE 2 PUFFS BY MOUTH INTO THE LUNGS DAILY 4 g 5   valsartan (DIOVAN) 160 MG tablet Take 1 tablet (160 mg total) by mouth daily. 90 tablet 1   zinc gluconate 50 MG tablet Take 50 mg by mouth daily.     No current facility-administered medications on file prior to visit.    Review of Systems:  As per HPI- otherwise negative.   Physical Examination: There were no vitals filed for this visit. There were no vitals filed for this visit. There is no height or weight on file to calculate BMI. Ideal Body Weight:    GEN: no acute distress. HEENT: Atraumatic, Normocephalic.  Ears and Nose: No external deformity. CV: RRR, No M/G/R. No JVD. No thrill. No extra  heart sounds. PULM: CTA B, no wheezes, crackles, rhonchi. No retractions. No resp. distress. No accessory muscle use. ABD: S, NT, ND, +BS. No  rebound. No HSM. EXTR: No c/c/e PSYCH: Normally interactive. Conversant.    Assessment and Plan: ***  Signed Lamar Blinks, MD

## 2022-05-20 ENCOUNTER — Encounter: Payer: Self-pay | Admitting: Family Medicine

## 2022-05-20 ENCOUNTER — Ambulatory Visit (INDEPENDENT_AMBULATORY_CARE_PROVIDER_SITE_OTHER): Payer: Medicare Other | Admitting: Family Medicine

## 2022-05-20 VITALS — BP 120/60 | HR 97 | Temp 98.2°F | Resp 18 | Ht 59.0 in | Wt 163.4 lb

## 2022-05-20 DIAGNOSIS — Z794 Long term (current) use of insulin: Secondary | ICD-10-CM | POA: Diagnosis not present

## 2022-05-20 DIAGNOSIS — Z23 Encounter for immunization: Secondary | ICD-10-CM

## 2022-05-20 DIAGNOSIS — J449 Chronic obstructive pulmonary disease, unspecified: Secondary | ICD-10-CM | POA: Diagnosis not present

## 2022-05-20 DIAGNOSIS — E1159 Type 2 diabetes mellitus with other circulatory complications: Secondary | ICD-10-CM

## 2022-05-20 DIAGNOSIS — I152 Hypertension secondary to endocrine disorders: Secondary | ICD-10-CM

## 2022-05-20 DIAGNOSIS — E785 Hyperlipidemia, unspecified: Secondary | ICD-10-CM | POA: Diagnosis not present

## 2022-05-20 DIAGNOSIS — F418 Other specified anxiety disorders: Secondary | ICD-10-CM

## 2022-05-20 DIAGNOSIS — I25118 Atherosclerotic heart disease of native coronary artery with other forms of angina pectoris: Secondary | ICD-10-CM

## 2022-05-20 LAB — HEMOGLOBIN A1C: Hgb A1c MFr Bld: 7.3 % — ABNORMAL HIGH (ref 4.6–6.5)

## 2022-05-20 NOTE — Patient Instructions (Signed)
Good to see you again today- I will be in touch with your labs Please let me know if you are not doing ok over the next few months- I know the holidays can be hard!   Please see me in 4-6 months for a recheck assuming all is ok Continue seeing your cardiologist as directed  You might want to join a wildlife rehab network and help take care of more animals?

## 2022-05-21 LAB — MICROALBUMIN / CREATININE URINE RATIO
Creatinine,U: 102.4 mg/dL
Microalb Creat Ratio: 5 mg/g (ref 0.0–30.0)
Microalb, Ur: 5.1 mg/dL — ABNORMAL HIGH (ref 0.0–1.9)

## 2022-05-22 DIAGNOSIS — S52501D Unspecified fracture of the lower end of right radius, subsequent encounter for closed fracture with routine healing: Secondary | ICD-10-CM | POA: Diagnosis not present

## 2022-05-27 ENCOUNTER — Encounter: Payer: Self-pay | Admitting: Family Medicine

## 2022-06-05 ENCOUNTER — Other Ambulatory Visit: Payer: Self-pay | Admitting: Podiatry

## 2022-06-05 ENCOUNTER — Other Ambulatory Visit: Payer: Self-pay | Admitting: Family Medicine

## 2022-06-05 ENCOUNTER — Other Ambulatory Visit: Payer: Self-pay | Admitting: Gastroenterology

## 2022-06-05 DIAGNOSIS — E1142 Type 2 diabetes mellitus with diabetic polyneuropathy: Secondary | ICD-10-CM

## 2022-06-05 DIAGNOSIS — R197 Diarrhea, unspecified: Secondary | ICD-10-CM

## 2022-06-14 NOTE — Progress Notes (Deleted)
[place] at [facility] [address], [place], [place] [address] [phone]  Date:  06/17/2022   Name:  Destiny Ballard   DOB:  07/26/1950   MRN:  [id number]  PCP:  Darreld Mclean, MD    Chief Complaint: No chief complaint on file.   History of Present Illness:  Destiny Ballard is a 71 y.o. very pleasant female patient who presents with the following:  Pt seen today with concern of stress Last seen by myself about a month ago - history of CAD with cardiology care, hypertension, CHF, sleep apnea, COPD, diabetes, GERD, hyperlipidemia  From our last visit:  She is coming up on the 5-year anniversary of her husband [name]'s death.  She admits to symptoms of depression, but denies any suicidal ideation. We discussed changing or increasing her fluoxetine, for the time being she prefers to continue.  Admits she does not have a lot to keep her busy during the day, we discussed some ideas of things that might interest her.  She does spend some time babysitting her grandkids which is a bright spot  Mammogram Eye exam Covid booster recommended   Patient Active Problem List   Diagnosis Date Noted   Diabetes mellitus ([place]) 03/01/2022   Hypoxia 11/27/2021   Osteopenia 06/27/2021   Melena 12/18/2020   History of colonic polyps 12/18/2020   Constipation 12/18/2020   Obesity    Iron deficiency anemia    Insulin resistance    Hyperlipidemia    Cervical spondylosis    Anxiety    Pneumonia due to COVID-19 virus 08/21/2020   Hyperlipidemia associated with type 2 diabetes mellitus ([place]) 08/21/2020   Type 2 diabetes mellitus with diabetic polyneuropathy, with long-term current use of insulin ([place]) 12/10/2019   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   Tinnitus of both ears 11/18/2019   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   Eustachian tube dysfunction, bilateral 11/18/2019   Chronic eczematous  otitis externa of both ears 11/18/2019   Obesity (BMI 30-39.9) 08/24/2018   Depression with anxiety 08/24/2018   CAD (coronary artery disease) 08/24/2018   Abnormal cardiovascular stress test 08/24/2018   Precordial chest pain    Sepsis ([place]) 09/18/2017   Chronic diastolic CHF (congestive heart failure) ([place]) 09/18/2017   Chronic rhinitis 08/07/2016   COPD with acute exacerbation ([place]) 04/25/2016   Hypertension associated with diabetes ([place]) 01/30/2016   OSA (obstructive sleep apnea) 05/06/2014   Dyspnea on exertion 09/02/2012   GERD 06/28/2008    Past Medical History:  Diagnosis Date   Anxiety    Prior suicide attempt   CAD (coronary artery disease)    a) s/p DES to LAD 07/2005 b) Last Myoview low risk 11/2011 showing small fixed apical perfusion defect (prior MI vs attenuation) but no ischemia - normal EF.   Cervical spondylosis    Chest pain 12/10/2011   Chronic diastolic CHF (congestive heart failure) ([facility]) 09/18/2017   Chronic eczematous otitis externa of both ears 11/18/2019   Chronic rhinitis 37/62/8315   Complication of anesthesia    developed pneumonia after nerve block was hospitalized   Constipation 12/18/2020   COPD with acute exacerbation ([place]) 04/25/2016   Coronary atherosclerosis 06/28/2008   Depression    Depression with anxiety    Diabetes mellitus ([place]) 09/08/2017   Diabetic neuropathy ([place])    Dyspnea on exertion 09/02/2012   CXR 07/2012:  No acute process.  Eustachian tube dysfunction, bilateral 11/18/2019   GERD 06/28/2008   Hearing loss    Left   History of colonic polyps 12/18/2020   History of kidney stones    HLD (hyperlipidemia)    Hyperlipidemia associated with type 2 diabetes mellitus ([place]) 08/21/2020   Hypertension    Hypertension associated with diabetes ([place]) 01/30/2016   Hypokalemia 01/27/2016   Insulin resistance    Iron deficiency anemia    Leukocytosis    history of mild leukocytosis   Lobar pneumonia, unspecified organism ([place])  11/13/2017   Melena 12/18/2020   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   Obesity (BMI 30-39.9) 08/24/2018   Last Assessment & Plan:  Exercise and weight reduction is encouraged.   OSA (obstructive sleep apnea) 05/06/2014   Pneumonia due to COVID-19 virus 08/21/2020   Precordial chest pain    Sciatic nerve disease, left    leg   Sepsis ([place]) 09/18/2017   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   Tinnitus of both ears 11/18/2019   Type 2 diabetes mellitus with hyperglycemia, with long-term current use of insulin ([place]) 12/10/2019   Uncontrolled type 2 diabetes mellitus with complication, with long-term current use of insulin 03/14/2020    Past Surgical History:  Procedure Laterality Date   BREAST ENHANCEMENT SURGERY     CARDIAC CATHETERIZATION  06/17/2007   NORMAL. EF 60%   CARDIAC CATHETERIZATION N/A 01/29/2016   Procedure: Left Heart Cath and Coronary Angiography;  Surgeon: Sherren Mocha, MD;  Location: [facility];  Service: Cardiovascular;  Laterality: N/A;   CERVICAL SPONDYLOSIS     SINGLE LEVEL FUSION   CHILDBIRTH     X3   COLONOSCOPY  2010   normal   COLONOSCOPY WITH PROPOFOL  10/01/2021   CORONARY STENT PLACEMENT  07/15/2005   LEFT ANTERIOR DESCENDING   FOREARM FRACTURE SURGERY  07/15/2008   hand and shoulder    INCISION AND DRAINAGE BREAST ABSCESS  01/05/2012       INCISION AND DRAINAGE PERIRECTAL ABSCESS N/A 02/18/2014   Procedure: IRRIGATION AND DEBRIDEMENT PERIRECTAL ABSCESS;  Surgeon: Pedro Earls, MD;  [facility];  Service: General;  Laterality: N/A;   LEFT HEART CATH AND CORONARY ANGIOGRAPHY N/A 10/10/2017   Procedure: LEFT HEART CATH AND CORONARY ANGIOGRAPHY;  Surgeon: Burnell Blanks, MD;  Location: [facility];  Service: Cardiovascular;  Laterality: N/A;   LUMBAR LAMINECTOMY     ORIF WRIST FRACTURE Right 11/27/2021   Procedure: OPEN REDUCTION INTERNAL FIXATION (ORIF) WRIST FRACTURE;   Surgeon: Marchia Bond, MD;  [facility];  Service: Orthopedics;  Laterality: Right;   ROTATOR CUFF REPAIR     bilaterla   TONSILLECTOMY AND ADENOIDECTOMY     TUBAL LIGATION     VIDEO BRONCHOSCOPY Bilateral 08/13/2016   Procedure: VIDEO BRONCHOSCOPY WITHOUT FLUORO;  Surgeon: Collene Gobble, MD;  [facility];  Service: Cardiopulmonary;  Laterality: Bilateral;    Social History   Tobacco Use   Smoking status: Never    Passive exposure: Yes   Smokeless tobacco: Never  Vaping Use   Vaping Use: Never used  Substance Use Topics   Alcohol use: Yes    Comment: occ   Drug use: No    Family History  Problem Relation Age of Onset   Heart attack Mother    Diabetes Mother    Lung cancer Mother    Asthma Mother    Heart disease Mother    Suicidality Father        "  killed himself"   Diabetes Sister    Cancer Sister    Colon cancer Sister 59   Asthma Daughter        x 2   Cancer Daughter        pre-cancerous polyp   Cervical cancer Daughter        cervical    Allergies Other        all family--seasonal allergies    Allergies  Allergen Reactions   Prednisone Other (See Comments)    REACTION: mood swings, nightmares. "Shot doesn't bother me, reaction is just with the pill" she states she has had the steroid injections before. From our records methylprednisone was given to her in 2013 without any complications.    Medication list has been reviewed and updated.  Current Outpatient Medications on File Prior to Visit  Medication Sig Dispense Refill   albuterol (VENTOLIN HFA) 108 (90 Base) MCG/ACT inhaler Inhale 2 puffs into the lungs every 8 (eight) hours as needed for wheezing or shortness of breath. 18 g 5   aspirin EC 81 MG tablet Take 81 mg by mouth daily.     calcium carbonate (TUMS EX) 750 MG chewable tablet Chew 1 tablet by mouth daily as needed for heartburn.     Cholecalciferol (VITAMIN D3) 1000 UNITS CAPS Take 1,000 Units by mouth daily.      dapagliflozin propanediol (FARXIGA) 10 MG TABS tablet Take 1 tablet (10 mg total) by mouth daily. 90 tablet 3   Dulaglutide (TRULICITY) 3 LO/7.5IE SOPN INJECT 3 MG AS DIRECTED ONCE A WEEK. 6 mL 3   fexofenadine (ALLEGRA) 180 MG tablet Take 180 mg by mouth daily.     FLUoxetine (PROZAC) 40 MG capsule TAKE 1 CAPSULE BY MOUTH EVERY DAY 90 capsule 1   furosemide (LASIX) 20 MG tablet Take 1 tablet (20 mg total) by mouth daily. For swelling / fluid retention 90 tablet 1   gabapentin (NEURONTIN) 300 MG capsule TAKE 2 CAPSULES BY MOUTH TWICE A DAY 360 capsule 1   hydrALAZINE (APRESOLINE) 25 MG tablet TAKE 2 TABLETS BY MOUTH 2 TIMES DAILY. FOR BLOOD PRESSURE 360 tablet 1   Insulin Glargine (BASAGLAR KWIKPEN) 100 UNIT/ML Inject 30 Units into the skin daily. Profile until patient requests 15 mL 1   Insulin Pen Needle 32G X 4 MM MISC 1 Device by Does not apply route daily. 100 each 3   metFORMIN (GLUCOPHAGE-XR) 500 MG 24 hr tablet Take 2 tablets (1,000 mg total) by mouth daily with breakfast. 180 tablet 1   metoprolol succinate (TOPROL-XL) 100 MG 24 hr tablet TAKE 1 TABLET BY MOUTH EVERY DAY 90 tablet 1   montelukast (SINGULAIR) 10 MG tablet TAKE 1 TABLET (10 MG TOTAL) BY MOUTH AT BEDTIME. FOR ALLERGIES / ASTHMA 90 tablet 1   pantoprazole (PROTONIX) 40 MG tablet TAKE 1 TABLET BY MOUTH EVERY DAY 90 tablet 1   rosuvastatin (CRESTOR) 20 MG tablet Take 1 tablet (20 mg total) by mouth daily. For cholesterol / heart 90 tablet 3   Tiotropium Bromide Monohydrate (SPIRIVA RESPIMAT) 2.5 MCG/ACT AERS INHALE 2 PUFFS BY MOUTH INTO THE LUNGS DAILY 4 g 5   valsartan (DIOVAN) 160 MG tablet Take 1 tablet (160 mg total) by mouth daily. 90 tablet 1   zinc gluconate 50 MG tablet Take 50 mg by mouth daily.     No current facility-administered medications on file prior to visit.    Review of Systems:  As per HPI- otherwise negative.   Physical Examination:  There were no vitals filed for this visit. There were no vitals  filed for this visit. There is no height or weight on file to calculate BMI. Ideal Body Weight:    GEN: no acute distress. HEENT: Atraumatic, Normocephalic.  Ears and Nose: No external deformity. CV: RRR, No M/G/R. No JVD. No thrill. No extra heart sounds. PULM: CTA B, no wheezes, crackles, rhonchi. No retractions. No resp. distress. No accessory muscle use. ABD: S, NT, ND, +BS. No rebound. No HSM. EXTR: No c/c/e PSYCH: Normally interactive. Conversant.    Assessment and Plan: ***  Signed Lamar Blinks, MD

## 2022-06-14 NOTE — Patient Instructions (Incomplete)
Good to see you again today  

## 2022-06-17 ENCOUNTER — Ambulatory Visit: Payer: Medicare Other

## 2022-06-17 ENCOUNTER — Ambulatory Visit: Payer: Medicare Other | Admitting: Family Medicine

## 2022-06-28 ENCOUNTER — Telehealth: Payer: Self-pay | Admitting: Family Medicine

## 2022-06-28 DIAGNOSIS — F32A Depression, unspecified: Secondary | ICD-10-CM

## 2022-06-28 MED ORDER — FLUOXETINE HCL 40 MG PO CAPS: 40.0000 mg | ORAL_CAPSULE | Freq: Every day | ORAL | 1 refills | Status: DC

## 2022-06-28 NOTE — Telephone Encounter (Signed)
Patient has been out of medication for a week and recently lost her sister.   Medication: FLUoxetine (PROZAC) 40 MG capsule   Has the patient contacted their pharmacy? Yes.   Pharmacy has been contacting us per patient.   Preferred Pharmacy (with phone number or street name): [id number] [address]Phone: [phone] Fax: [phone]  Agent: Please be advised that RX refills may take up to 3 business days. We ask that you follow-up with your pharmacy.

## 2022-06-28 NOTE — Telephone Encounter (Signed)
Rx sent 

## 2022-08-06 ENCOUNTER — Ambulatory Visit: Payer: Medicare Other | Admitting: Pharmacist

## 2022-08-06 DIAGNOSIS — J449 Chronic obstructive pulmonary disease, unspecified: Secondary | ICD-10-CM

## 2022-08-06 DIAGNOSIS — I152 Hypertension secondary to endocrine disorders: Secondary | ICD-10-CM

## 2022-08-06 DIAGNOSIS — I5032 Chronic diastolic (congestive) heart failure: Secondary | ICD-10-CM

## 2022-08-06 DIAGNOSIS — E1159 Type 2 diabetes mellitus with other circulatory complications: Secondary | ICD-10-CM

## 2022-08-06 MED ORDER — DAPAGLIFLOZIN PROPANEDIOL 10 MG PO TABS: 10.0000 mg | ORAL_TABLET | Freq: Every day | ORAL | 3 refills | Status: DC

## 2022-08-06 NOTE — Patient Instructions (Signed)
Ms. Bugaj It was a pleasure speaking with you today.  Below is a summary of your health goals and summary of our recent visit.   Diabetes: Goal A1c is < 7.0% Recommend increasing Trulicity to 4.'5mg'$  weekly after you complete your current prescription for Trulicity '3mg'$  (around mid to late February)  Continue metformin ER '500mg'$  1 tablet twice a day and Farxiga '10mg'$  daily.  Since she is taking Basaglar 30 units every OTHER day - consider changing to 15 units EVERY day as this wouldgive more consistent blood glucose readings.  Blood glucose goals:              Fasting 80 to 130              2 hours after eating < 180   High Blood Pressure blood pressure at goal of < 130/80 Check blood pressure 2 to 3 times per week - record reading for future office visits.  Continue hydralazine '25mg'$  twice a day, valsartan '160mg'$  daily, furosemide '20mg'$  daily and metoprolol ER'100mg'$  daily.  COPD:  continue to use Spriva Respimat and albuterol.  Discussed that Spiriva is amaintenance inhaler to be used daily for prevention Albuterol is a rescue inhaler - used as needed when having shortness of breath or wheezing.   Medication Management:  I sent in updated prescription for Farxiga and [facility] information to [facility].  I am mailing you copy of this card and the other 2024 discount cards.   Keep up the good work!  [name], PharmD Clinical Pharmacist [facility] [phone] (direct line)  [phone] (main office number)   Patient verbalizes understanding of instructions and care plan provided today and agrees to view in [place]. Active MyChart status and patient understanding of how to access instructions and care plan via MyChart confirmed with patient.

## 2022-08-06 NOTE — Progress Notes (Signed)
Pharmacy Note  08/06/2022 Name: Destiny Ballard MRN: [id number] DOB: Apr 02, 1951  Subjective: Destiny Ballard is a 72 y.o. year old female who is a primary care patient of Copland, Gay Filler, MD. Referred to Clinical Pharmacist Practitioner for medication management, CHF and diabetes management   Engaged with patient by telephone for follow up visit in response to provider referral for pharmacy case management and/or care coordination services.   Diabetes/ Obesity:  Current medications: metformin ER '500mg'$  2 tabs daily (patient is taking 1 tablet twice a day), Trulicity '3mg'$  SQ weekly, Farxiga '10mg'$  daily and Basaglar 30 units day (patient reports she is taking every OTHER day)  Previously tried metofmrin '1000mg'$  twice a day but dose was lowered due to GI intolerance. Patient denies diarrhea / loose stools today. Home blood glucose - 107 lowest, 163 highest; other readings 143, 116, 160.  Denies s/s of hypoglycemia.  Weight when started Trulicity = [id number] Last office weight = 163lbs (05/2022) Total weight loss since starting Trulicity = 15 lbs.   Hypertension / CHF:  Last office blood pressure in office was 120/60. Home blood pressure 110 to 143 / 60's.  Current therapy: hydralazine '25mg'$  - 2 tablets = '50mg'$  twice a day (she is only taking '25mg'$  twice a day), furosemide '20mg'$  daily, Valsartan '160mg'$  daily, metoprolol succinate '100mg'$  daily, Farxiga '10mg'$  daily.  Reports edema has improved with daily furosemide and elevation of feet.  She was having some dizziness until she lowered dose of hydralazine form '50mg'$  twice a day to '25mg'$  twice a day  COPD: Controlled;  No recent exacerbations. Reports she uses Spiriva as needed and also has albuterol to use if needed. She has not felt that she needed either in the last 2 weeks.  Has been several years sins she has seen pulmonologist.   Medication management;  Patient is past due to fill several medications. She states she needs updated RF for [place].   She also uses discount cards for her branded meds and might need new cards with new year starting.    Patient Care Team: Copland, Gay Filler, MD as PCP - General (Family Medicine) Brien Few, MD as Consulting Physician (Obstetrics and Gynecology) [name], [place] (Pharmacist)    Assessment:  Review of patient status, including review of consultants reports, laboratory and other test data, was performed as part of comprehensive evaluation and provision of chronic care management services.   Objective:  Lab Results  Component Value Date   CREATININE 0.59 02/13/2022   CREATININE 0.56 11/29/2021   CREATININE 0.58 11/28/2021    Lab Results  Component Value Date   HGBA1C 7.3 (H) 05/20/2022       Component Value Date/Time   CHOL 130 01/01/2021 1144   TRIG 266 (H) 01/01/2021 1144   HDL 44 01/01/2021 1144   CHOLHDL 3.0 01/01/2021 1144   CHOLHDL 3 09/09/2019 1128   VLDL 72.6 (H) 09/09/2019 1128   LDLCALC 45 01/01/2021 1144   LDLDIRECT 64.0 09/09/2019 1128     BP Readings from Last 3 Encounters:  05/20/22 120/60  03/01/22 130/70  02/13/22 120/60      Allergies  Allergen Reactions   Prednisone Other (See Comments)    REACTION: mood swings, nightmares. "Shot doesn't bother me, reaction is just with the pill" she states she has had the steroid injections before. From our records methylprednisone was given to her in 2013 without any complications.    Medications Reviewed Today     Reviewed by [name], RPH-CPP (  Pharmacist) on 08/06/22 at 1000  Med List Status: <None>   Medication Order Taking? Sig Documenting Provider Last Dose Status Informant  albuterol (VENTOLIN HFA) 108 (90 Base) MCG/ACT inhaler [id number] Yes Inhale 2 puffs into the lungs every 8 (eight) hours as needed for wheezing or shortness of breath. Copland, Gay Filler, MD Taking Active   aspirin EC 81 MG tablet [id number] Yes Take 81 mg by mouth daily. [provider] Taking Active Self   calcium carbonate (TUMS EX) 750 MG chewable tablet [id number] Yes Chew 1 tablet by mouth daily as needed for heartburn. [provider] Taking Active Self  Cholecalciferol (VITAMIN D3) 1000 UNITS CAPS [id number] Yes Take 1,000 Units by mouth daily. [provider] Taking Active Self  dapagliflozin propanediol (FARXIGA) 10 MG TABS tablet [id number] Yes Take 1 tablet (10 mg total) by mouth daily. Shamleffer, Melanie Crazier, MD Taking Active Self  Dulaglutide (TRULICITY) 3 LK/4.4WN SOPN [id number] Yes INJECT 3 MG AS DIRECTED ONCE A WEEK. Shamleffer, Melanie Crazier, MD Taking Active   fexofenadine [facility]) 180 MG tablet [id number] Yes Take 180 mg by mouth daily. [provider] Taking Active Self  FLUoxetine (PROZAC) 40 MG capsule [id number] Yes Take 1 capsule (40 mg total) by mouth daily. Copland, Gay Filler, MD Taking Active   furosemide (LASIX) 20 MG tablet [id number] Yes Take 1 tablet (20 mg total) by mouth daily. For swelling / fluid retention Copland, Gay Filler, MD Taking Active   gabapentin (NEURONTIN) 300 MG capsule [id number] Yes TAKE 2 CAPSULES BY MOUTH TWICE A DAY Copland, Gay Filler, MD Taking Active   hydrALAZINE (APRESOLINE) 25 MG tablet [id number] Yes TAKE 2 TABLETS BY MOUTH 2 TIMES DAILY. FOR BLOOD PRESSURE  Patient taking differently: Take 25 mg by mouth in the morning and at bedtime.   Copland, Gay Filler, MD Taking Active   Insulin Glargine [facility] KWIKPEN) 100 UNIT/ML [id number] Yes Inject 30 Units into the skin daily. Profile until patient requests Copland, Gay Filler, MD Taking Active            Med Note Antony Contras, [place] Apr 03, 2022 10:15 AM) Taking every other day  Insulin Pen Needle 32G X 4 MM MISC [id number] Yes 1 Device by Does not apply route daily. Shamleffer, Melanie Crazier, MD Taking Active Self  metFORMIN (GLUCOPHAGE-XR) 500 MG 24 hr tablet [id number] Yes Take 2 tablets (1,000 mg total) by mouth daily with breakfast.  Patient taking differently:  Take 500 mg by mouth 2 (two) times daily with a meal.   Copland, Gay Filler, MD Taking Active   metoprolol succinate (TOPROL-XL) 100 MG 24 hr tablet [id number] Yes TAKE 1 TABLET BY MOUTH EVERY DAY Copland, Gay Filler, MD Taking Active   montelukast (SINGULAIR) 10 MG tablet [id number] Yes TAKE 1 TABLET (10 MG TOTAL) BY MOUTH AT BEDTIME. FOR ALLERGIES / ASTHMA Copland, Gay Filler, MD Taking Active   OVER THE COUNTER MEDICATION [id number] Yes Take 1 each by mouth daily. CBD gummy [provider] Taking Active   pantoprazole (PROTONIX) 40 MG tablet [id number] Yes TAKE 1 TABLET BY MOUTH EVERY DAY Copland, Gay Filler, MD Taking Active   rosuvastatin (CRESTOR) 20 MG tablet [id number] Yes Take 1 tablet (20 mg total) by mouth daily. For cholesterol / heart Copland, Gay Filler, MD Taking Active Self  Tiotropium Bromide Monohydrate (SPIRIVA RESPIMAT) 2.5 MCG/ACT AERS [id number] Yes INHALE 2 PUFFS BY MOUTH INTO THE LUNGS DAILY Copland, Gay Filler, MD Taking Active  Med Note Antony Contras, Aalani Aikens B   Tue Aug 06, 2022  9:31 AM) Not taking every day  valsartan (DIOVAN) 160 MG tablet [id number] Yes Take 1 tablet (160 mg total) by mouth daily. Copland, Gay Filler, MD Taking Active   zinc gluconate 50 MG tablet [id number]  Take 50 mg by mouth daily. [provider]  Active Self  Med List Note (Copland, Gay Filler, MD 03/12/21 1526): Lorazepam rarely.            Patient Active Problem List   Diagnosis Date Noted   Diabetes mellitus ([place]) 03/01/2022   Hypoxia 11/27/2021   Osteopenia 06/27/2021   Melena 12/18/2020   History of colonic polyps 12/18/2020   Constipation 12/18/2020   Obesity    Iron deficiency anemia    Insulin resistance    Hyperlipidemia    Cervical spondylosis    Anxiety    Pneumonia due to COVID-19 virus 08/21/2020   Hyperlipidemia associated with type 2 diabetes mellitus ([place]) 08/21/2020   Type 2 diabetes mellitus with diabetic polyneuropathy, with long-term current use of  insulin ([place]) 12/10/2019   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   Tinnitus of both ears 11/18/2019   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   Eustachian tube dysfunction, bilateral 11/18/2019   Chronic eczematous otitis externa of both ears 11/18/2019   Obesity (BMI 30-39.9) 08/24/2018   Depression with anxiety 08/24/2018   CAD (coronary artery disease) 08/24/2018   Abnormal cardiovascular stress test 08/24/2018   Precordial chest pain    Sepsis ([place]) 09/18/2017   Chronic diastolic CHF (congestive heart failure) ([place]) 09/18/2017   Chronic rhinitis 08/07/2016   COPD with acute exacerbation ([place]) 04/25/2016   Hypertension associated with diabetes ([place]) 01/30/2016   OSA (obstructive sleep apnea) 05/06/2014   Dyspnea on exertion 09/02/2012   GERD 06/28/2008    Medication Assistance:   Using manufacturer discount cards - Has [employer] pharmacy benefits.  Provided updated discount cards for [employer] Primrose and [employer] for 2024.   Assessment / Plan:  Diabetes / Obesity: Goal A1c is < 7.0%; Weight goal 155lbs Recommend increasing Trulicity to 4.'5mg'$  weekly after she completes current Rx for '3mg'$  (has filled 84 DS on 06/16/2022) Continue metformin Er '500mg'$  1 tablet twice a day, Farxiga '10mg'$  daily.  Since she is taking Basaglar 30 units every OTHER day - suggested that she try changing to 15 units EVERY day as this would likely give more consistent blood glucose readings.  Reviewed blood glucose goals:   Fasting 80 to 130   2 hours after eating < 180  Hypertension: Last office blood pressure at goal of < 130/80 Reviewed blood glucose goal Check blood pressure 2 to 3 times per week - record reading for future office visits.  Continue hydralazine '25mg'$  twice a day, valsartan '160mg'$  daily, furosemide '20mg'$  daily and metoprolol ER'100mg'$  daily. COPD: Controlled, no recent exacerbations continue to use Spriva Respimat and albuterol.  Discussed that  Spiriva was a maintenance inhaler to be used daily for prevention Albuterol is a rescue inhaler - used as needed when having shortness of breath or wheezing.  Medication Management:  Reviewed med list. Updated needed prescriptions with recent changes. Discussed adherence.  Assited in requested needed Refills for pantoprazole, valsartan, metoprolol and rosuvastatin.  Updated discount cards for 2024 for patient.  Meds ordered this encounter  Medications   dapagliflozin propanediol (FARXIGA) 10 MG TABS tablet    Sig: Take 1 tablet (10 mg total) by mouth daily.  Dispense:  90 tablet    Refill:  3    Please use discount card - BIN [id number] / PCN - CN / GRP- [id number] / ID - [id number]   Patient requested appointment for 3 month follow up with PCP - appt made for 09/11/2022  [name], PharmD Clinical Pharmacist [facility] [place] [facility]

## 2022-09-04 ENCOUNTER — Telehealth: Payer: Self-pay | Admitting: Pharmacist

## 2022-09-04 ENCOUNTER — Telehealth: Payer: Medicare Other

## 2022-09-04 MED ORDER — TRULICITY 4.5 MG/0.5ML ~~LOC~~ SOAJ: 4.5000 mg | SUBCUTANEOUS | 0 refills | Status: DC

## 2022-09-04 NOTE — Telephone Encounter (Signed)
Appointment was scheduled for 09/04/2022 for follow up with Clinical Pharmacist Practitioner. Patient has not at home when called. Rescheduled appointment for 09/06/2022.  We had planned to increase dose to Trulicity from 52m weekly to 4.545mweekly at this visit. Will send in Rx so patient can start when she runs out of 60m11mhis week.

## 2022-09-06 ENCOUNTER — Other Ambulatory Visit: Payer: Medicare Other | Admitting: Pharmacist

## 2022-09-06 ENCOUNTER — Other Ambulatory Visit: Payer: Self-pay | Admitting: Family Medicine

## 2022-09-06 DIAGNOSIS — Z794 Long term (current) use of insulin: Secondary | ICD-10-CM

## 2022-09-06 DIAGNOSIS — E1159 Type 2 diabetes mellitus with other circulatory complications: Secondary | ICD-10-CM

## 2022-09-06 DIAGNOSIS — I5032 Chronic diastolic (congestive) heart failure: Secondary | ICD-10-CM

## 2022-09-06 DIAGNOSIS — J441 Chronic obstructive pulmonary disease with (acute) exacerbation: Secondary | ICD-10-CM

## 2022-09-06 DIAGNOSIS — I152 Hypertension secondary to endocrine disorders: Secondary | ICD-10-CM

## 2022-09-06 DIAGNOSIS — R600 Localized edema: Secondary | ICD-10-CM

## 2022-09-06 DIAGNOSIS — E1142 Type 2 diabetes mellitus with diabetic polyneuropathy: Secondary | ICD-10-CM

## 2022-09-06 MED ORDER — FUROSEMIDE 20 MG PO TABS: 20.0000 mg | ORAL_TABLET | Freq: Every day | ORAL | 1 refills | Status: DC

## 2022-09-06 MED ORDER — DEXCOM G7 SENSOR MISC: 1 refills | Status: DC

## 2022-09-06 MED ORDER — FREESTYLE LANCETS MISC: 5 refills | Status: AC

## 2022-09-06 NOTE — Addendum Note (Signed)
Addended by: [name] B on: 09/06/2022 05:12 PM   Modules accepted: Orders

## 2022-09-06 NOTE — Progress Notes (Signed)
Pharmacy Note  09/06/2022 Name: Destiny Ballard MRN: [id number] DOB: 1951-05-30  Subjective: Destiny Ballard is a 72 y.o. year old female who is a primary care patient of Copland, Gay Filler, MD. Referred to Clinical Pharmacist Practitioner for medication management, CHF and diabetes management   Engaged with patient by telephone for follow up visit in response to provider referral for pharmacy case management and/or care coordination services.   Diabetes/ Obesity:  Current medications: metformin ER '500mg'$  2 tabs daily (patient is taking 2 tablets twice a day), Trulicity '3mg'$  SQ weekly (she will start 4.'5mg'$  weekly with next dose), Farxiga '10mg'$  daily and Basaglar 30 units day (patient reports she is taking every OTHER day)  Home blood glucose - 105 lowest, 147 highest.  Uses Freestyle lite glucometer. Check blood glucose about 3 times per week. She is interesting in Continuous Glucose Monitor.   Has experienced 1 hypoglycemia event - felt shaky but did not have glucometer to check blood glucose. States she ate something and felt better.   Weight when started Trulicity = [id number] Last office weight = 163lbs (05/2022) Total weight loss since starting Trulicity = 15 lbs.   Hypertension / CHF:  Last office blood pressure in office was 120/60.  No home blood pressure reported today.  Current therapy: hydralazine '25mg'$  - 1 tablets = '25mg'$  twice a day, furosemide '20mg'$  daily if needed for swelling, Valsartan '160mg'$  daily, metoprolol succinate '100mg'$  daily, Farxiga '10mg'$  daily.  Reports edema has improved with more frequent furosemide use and elevation of feet.  Denies dizziness  COPD: Controlled;  No recent exacerbations. Reports she uses Spiriva as needed and also has albuterol to use if needed. She has one episode when she was playing with her grandson that she got short of breath. Did not have albuterol with her.   Allergies:  Current medications - montelukast and fexofenadine Patient reports  allergies are controlled as long as she takes medication daily   Patient Care Team: Copland, Gay Filler, MD as PCP - General (Family Medicine) Brien Few, MD as Consulting Physician (Obstetrics and Gynecology) [name], [place] (Pharmacist)    Assessment:  Review of patient status, including review of consultants reports, laboratory and other test data, was performed as part of comprehensive evaluation and provision of chronic care management services.   Objective:  Lab Results  Component Value Date   CREATININE 0.59 02/13/2022   CREATININE 0.56 11/29/2021   CREATININE 0.58 11/28/2021    Lab Results  Component Value Date   HGBA1C 7.3 (H) 05/20/2022       Component Value Date/Time   CHOL 130 01/01/2021 1144   TRIG 266 (H) 01/01/2021 1144   HDL 44 01/01/2021 1144   CHOLHDL 3.0 01/01/2021 1144   CHOLHDL 3 09/09/2019 1128   VLDL 72.6 (H) 09/09/2019 1128   LDLCALC 45 01/01/2021 1144   LDLDIRECT 64.0 09/09/2019 1128     BP Readings from Last 3 Encounters:  05/20/22 120/60  03/01/22 130/70  02/13/22 120/60      Allergies  Allergen Reactions   Prednisone Other (See Comments)    REACTION: mood swings, nightmares. "Shot doesn't bother me, reaction is just with the pill" she states she has had the steroid injections before. From our records methylprednisone was given to her in 2013 without any complications.    Medications Reviewed Today     Reviewed by [name], RPH-CPP (Pharmacist) on 09/06/22 at 39  Med List Status: <None>   Medication Order Taking? Sig Documenting Provider  Last Dose Status Informant  albuterol (VENTOLIN HFA) 108 (90 Base) MCG/ACT inhaler [id number] Yes Inhale 2 puffs into the lungs every 8 (eight) hours as needed for wheezing or shortness of breath. Copland, Gay Filler, MD Taking Active   aspirin EC 81 MG tablet [id number] Yes Take 81 mg by mouth daily. [provider] Taking Active Self  calcium carbonate (TUMS EX) 750 MG  chewable tablet [id number] Yes Chew 1 tablet by mouth daily as needed for heartburn. [provider] Taking Active Self  Cholecalciferol (VITAMIN D3) 1000 UNITS CAPS [id number] Yes Take 1,000 Units by mouth daily. [provider] Taking Active Self  dapagliflozin propanediol (FARXIGA) 10 MG TABS tablet [id number] Yes Take 1 tablet (10 mg total) by mouth daily. Copland, Gay Filler, MD Taking Active   Dulaglutide (TRULICITY) 4.5 0000000 SOPN [id number] Yes Inject 4.5 mg as directed once a week. Copland, Gay Filler, MD Taking Active   fexofenadine (ALLEGRA) 180 MG tablet [id number] Yes Take 180 mg by mouth daily. [provider] Taking Active Self  FLUoxetine (PROZAC) 40 MG capsule [id number] Yes Take 1 capsule (40 mg total) by mouth daily. Copland, Gay Filler, MD Taking Active   furosemide (LASIX) 20 MG tablet [id number] No Take 1 tablet (20 mg total) by mouth daily. For swelling / fluid retention  Patient not taking: Reported on 09/06/2022   Copland, Gay Filler, MD Not Taking Active   gabapentin (NEURONTIN) 300 MG capsule [id number] Yes TAKE 2 CAPSULES BY MOUTH TWICE A DAY Copland, Gay Filler, MD Taking Active   hydrALAZINE (APRESOLINE) 25 MG tablet [id number] Yes TAKE 2 TABLETS BY MOUTH 2 TIMES DAILY. FOR BLOOD PRESSURE  Patient taking differently: Take 25 mg by mouth in the morning and at bedtime.   Copland, Gay Filler, MD Taking Active   Insulin Glargine [facility] KWIKPEN) 100 UNIT/ML [id number] Yes Inject 30 Units into the skin daily. Profile until patient requests Copland, Gay Filler, MD Taking Active            Med Note Antony Contras, [place] Apr 03, 2022 10:15 AM) Taking every other day  Insulin Pen Needle 32G X 4 MM MISC [id number] Yes 1 Device by Does not apply route daily. Shamleffer, Melanie Crazier, MD Taking Active Self  metFORMIN (GLUCOPHAGE-XR) 500 MG 24 hr tablet [id number] Yes Take 2 tablets (1,000 mg total) by mouth daily with breakfast.  Patient taking differently: Take  1,000 mg by mouth 2 (two) times daily with a meal.   Copland, Gay Filler, MD Taking Active   metoprolol succinate (TOPROL-XL) 100 MG 24 hr tablet [id number] Yes TAKE 1 TABLET BY MOUTH EVERY DAY Copland, Gay Filler, MD Taking Active   montelukast (SINGULAIR) 10 MG tablet [id number] Yes TAKE 1 TABLET (10 MG TOTAL) BY MOUTH AT BEDTIME. FOR ALLERGIES / ASTHMA Copland, Gay Filler, MD Taking Active   OVER THE COUNTER MEDICATION [id number] Yes Take 1 each by mouth daily. CBD gummy [provider] Taking Active   pantoprazole (PROTONIX) 40 MG tablet [id number] Yes TAKE 1 TABLET BY MOUTH EVERY DAY Copland, Gay Filler, MD Taking Active   rosuvastatin (CRESTOR) 20 MG tablet [id number] Yes Take 1 tablet (20 mg total) by mouth daily. For cholesterol / heart Copland, Gay Filler, MD Taking Active Self  Tiotropium Bromide Monohydrate (SPIRIVA RESPIMAT) 2.5 MCG/ACT AERS [id number] Yes INHALE 2 PUFFS BY MOUTH INTO THE LUNGS DAILY Copland, Gay Filler, MD Taking Active            Med Note [place], [facility]  B   Tue Aug 06, 2022  9:31 AM) Not taking every day  valsartan (DIOVAN) 160 MG tablet [id number] Yes Take 1 tablet (160 mg total) by mouth daily. Copland, Gay Filler, MD Taking Active   zinc gluconate 50 MG tablet [id number] No Take 50 mg by mouth daily.  Patient not taking: Reported on 09/06/2022   [provider] Not Taking Active Self  Med List Note (Copland, Gay Filler, MD 03/12/21 1526): Lorazepam rarely.            Patient Active Problem List   Diagnosis Date Noted   Diabetes mellitus ([place]) 03/01/2022   Hypoxia 11/27/2021   Osteopenia 06/27/2021   Melena 12/18/2020   History of colonic polyps 12/18/2020   Constipation 12/18/2020   Obesity    Iron deficiency anemia    Insulin resistance    Hyperlipidemia    Cervical spondylosis    Anxiety    Pneumonia due to COVID-19 virus 08/21/2020   Hyperlipidemia associated with type 2 diabetes mellitus ([place]) 08/21/2020   Type 2 diabetes mellitus with  diabetic polyneuropathy, with long-term current use of insulin ([place]) 12/10/2019   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   Tinnitus of both ears 11/18/2019   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   Eustachian tube dysfunction, bilateral 11/18/2019   Chronic eczematous otitis externa of both ears 11/18/2019   Obesity (BMI 30-39.9) 08/24/2018   Depression with anxiety 08/24/2018   CAD (coronary artery disease) 08/24/2018   Abnormal cardiovascular stress test 08/24/2018   Precordial chest pain    Sepsis ([place]) 09/18/2017   Chronic diastolic CHF (congestive heart failure) ([place]) 09/18/2017   Chronic rhinitis 08/07/2016   COPD with acute exacerbation ([place]) 04/25/2016   Hypertension associated with diabetes ([place]) 01/30/2016   OSA (obstructive sleep apnea) 05/06/2014   Dyspnea on exertion 09/02/2012   GERD 06/28/2008    Medication Assistance:   Using manufacturer discount cards - Has [employer] pharmacy benefits.  Provided updated discount cards for [employer] Primrose and [employer] for 2024.   Assessment / Plan:  Diabetes / Obesity: Goal A1c is < 7.0%; Weight goal 123456 Increased Trulicity to 4.'5mg'$  weekly  Continue metformin ER'500mg'$  2 tablets twice a day and Farxiga '10mg'$  daily.  Since she is taking Basaglar 30 units every OTHER day - suggested that she try changing to 15 units EVERY day as this would likely give more consistent blood glucose readings.  Sent in Rx for Dex Com 7 sensors.  Reviewed blood glucose goals:   Fasting 80 to 130   2 hours after eating < 180   Hypertension: Last office blood pressure at goal of < 130/80 Reviewed blood glucose goal Check blood pressure 2 to 3 times per week - record reading for future office visits.  Continue hydralazine '25mg'$  twice a day, valsartan '160mg'$  daily and metoprolol ER'100mg'$  daily. Coordinated with pharmacy to refill furosemide.  COPD: Controlled, no recent exacerbations continue to use Spriva  Respimat and albuterol.  Discussed that Spiriva was a maintenance inhaler to be used daily for prevention Albuterol is a rescue inhaler - used as needed when having shortness of breath or wheezing. Recommended she carry rescue inhaler with her during activity and keep an extra in her purse.   Medication Management:  Reviewed med list.  Reviewed refill history and discussed adherence.   Meds ordered this encounter  Medications   Continuous Blood Gluc Sensor (DEXCOM G7 SENSOR) MISC    Sig: Use to check blood glucose continuously.  Change sensor every 10 days.    Dispense:  9 each    Refill:  1    Patient has type 2 DM, insulin dependent (taking Basaglar daily)   If DexCom is covered will provide education and help patient to place first sensor when she is in the office 09/11/2022.    [name], PharmD Clinical Pharmacist [facility] [place]   Addendum:  Checked with [facility] and DexCom coverage required documents from provider office to be faxed to [phone] for review regarding DexCom prior authorization. Will send in.

## 2022-09-07 NOTE — Patient Instructions (Incomplete)
Good to see you again!

## 2022-09-07 NOTE — Progress Notes (Deleted)
[place] at [facility] [address], [place], [place] [address] [phone]  Date:  09/11/2022   Name:  Destiny Ballard   DOB:  [birth year]   MRN:  [id number]  PCP:  Darreld Mclean, MD    Chief Complaint: No chief complaint on file.   History of Present Illness:  Destiny Ballard is a 72 y.o. very pleasant female patient who presents with the following:  Pt seen today for a recheck- history of CAD with cardiology care, hypertension, CHF, sleep apnea, COPD, diabetes, GERD, hyperlipidemia   Last seen by myself in November  She lost her husband about 5 years ago Labs done in August   Mammo Eye exam Covid booster  Stress myoview 7/22 Lab Results  Component Value Date   HGBA1C 7.3 (H) 05/20/2022      Patient Active Problem List   Diagnosis Date Noted   Diabetes mellitus ([place]) 03/01/2022   Hypoxia 11/27/2021   Osteopenia 06/27/2021   Melena 12/18/2020   History of colonic polyps 12/18/2020   Constipation 12/18/2020   Obesity    Iron deficiency anemia    Insulin resistance    Hyperlipidemia    Cervical spondylosis    Anxiety    Pneumonia due to COVID-19 virus 08/21/2020   Hyperlipidemia associated with type 2 diabetes mellitus ([place]) 08/21/2020   Type 2 diabetes mellitus with diabetic polyneuropathy, with long-term current use of insulin ([place]) 12/10/2019   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   Tinnitus of both ears 11/18/2019   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   Eustachian tube dysfunction, bilateral 11/18/2019   Chronic eczematous otitis externa of both ears 11/18/2019   Obesity (BMI 30-39.9) 08/24/2018   Depression with anxiety 08/24/2018   CAD (coronary artery disease) 08/24/2018   Abnormal cardiovascular stress test 08/24/2018   Precordial chest pain    Sepsis ([place]) 09/18/2017   Chronic diastolic CHF (congestive heart failure) ([place]) 09/18/2017   Chronic  rhinitis 08/07/2016   COPD with acute exacerbation ([place]) 04/25/2016   Hypertension associated with diabetes ([place]) 01/30/2016   OSA (obstructive sleep apnea) 05/06/2014   Dyspnea on exertion 09/02/2012   GERD 06/28/2008    Past Medical History:  Diagnosis Date   Anxiety    Prior suicide attempt   CAD (coronary artery disease)    a) s/p DES to LAD 07/2005 b) Last Myoview low risk 11/2011 showing small fixed apical perfusion defect (prior MI vs attenuation) but no ischemia - normal EF.   Cervical spondylosis    Chest pain 12/10/2011   Chronic diastolic CHF (congestive heart failure) ([facility]) 09/18/2017   Chronic eczematous otitis externa of both ears 11/18/2019   Chronic rhinitis 0000000   Complication of anesthesia    developed pneumonia after nerve block was hospitalized   Constipation 12/18/2020   COPD with acute exacerbation ([place]) 04/25/2016   Coronary atherosclerosis 06/28/2008   Depression    Depression with anxiety    Diabetes mellitus ([place]) 09/08/2017   Diabetic neuropathy ([place])    Dyspnea on exertion 09/02/2012   CXR 07/2012:  No acute process.     Eustachian tube dysfunction, bilateral 11/18/2019   GERD 06/28/2008   Hearing loss    Left   History of colonic polyps 12/18/2020   History of kidney stones    HLD (hyperlipidemia)    Hyperlipidemia associated with type 2 diabetes mellitus ([place]) 08/21/2020   Hypertension    Hypertension associated  with diabetes ([place]) 01/30/2016   Hypokalemia 01/27/2016   Insulin resistance    Iron deficiency anemia    Leukocytosis    history of mild leukocytosis   Lobar pneumonia, unspecified organism ([place]) 11/13/2017   Melena 12/18/2020   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   Obesity (BMI 30-39.9) 08/24/2018   Last Assessment & Plan:  Exercise and weight reduction is encouraged.   OSA (obstructive sleep apnea) 05/06/2014   Pneumonia due to COVID-19 virus 08/21/2020   Precordial chest pain     Sciatic nerve disease, left    leg   Sepsis ([place]) 09/18/2017   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   Tinnitus of both ears 11/18/2019   Type 2 diabetes mellitus with hyperglycemia, with long-term current use of insulin ([place]) 12/10/2019   Uncontrolled type 2 diabetes mellitus with complication, with long-term current use of insulin 03/14/2020    Past Surgical History:  Procedure Laterality Date   BREAST ENHANCEMENT SURGERY     CARDIAC CATHETERIZATION  06/17/2007   NORMAL. EF 60%   CARDIAC CATHETERIZATION N/A 01/29/2016   Procedure: Left Heart Cath and Coronary Angiography;  Surgeon: Sherren Mocha, MD;  Location: [facility];  Service: Cardiovascular;  Laterality: N/A;   CERVICAL SPONDYLOSIS     SINGLE LEVEL FUSION   CHILDBIRTH     X3   COLONOSCOPY  2010   normal   COLONOSCOPY WITH PROPOFOL  10/01/2021   CORONARY STENT PLACEMENT  07/15/2005   LEFT ANTERIOR DESCENDING   FOREARM FRACTURE SURGERY  07/15/2008   hand and shoulder    INCISION AND DRAINAGE BREAST ABSCESS  01/05/2012       INCISION AND DRAINAGE PERIRECTAL ABSCESS N/A 02/18/2014   Procedure: IRRIGATION AND DEBRIDEMENT PERIRECTAL ABSCESS;  Surgeon: Pedro Earls, MD;  Location: [facility];  Service: General;  Laterality: N/A;   LEFT HEART CATH AND CORONARY ANGIOGRAPHY N/A 10/10/2017   Procedure: LEFT HEART CATH AND CORONARY ANGIOGRAPHY;  Surgeon: Burnell Blanks, MD;  Location: [facility];  Service: Cardiovascular;  Laterality: N/A;   LUMBAR LAMINECTOMY     ORIF WRIST FRACTURE Right 11/27/2021   Procedure: OPEN REDUCTION INTERNAL FIXATION (ORIF) WRIST FRACTURE;  Surgeon: Marchia Bond, MD;  Location: [facility];  Service: Orthopedics;  Laterality: Right;   ROTATOR CUFF REPAIR     [place]   TONSILLECTOMY AND ADENOIDECTOMY     TUBAL LIGATION     VIDEO BRONCHOSCOPY Bilateral 08/13/2016   Procedure: VIDEO BRONCHOSCOPY WITHOUT FLUORO;  Surgeon: Collene Gobble, MD;  Location: [facility];   Service: Cardiopulmonary;  Laterality: Bilateral;    Social History   Tobacco Use   Smoking status: Never    Passive exposure: Yes   Smokeless tobacco: Never  Vaping Use   Vaping Use: Never used  Substance Use Topics   Alcohol use: Yes    Comment: occ   Drug use: No    Family History  Problem Relation Age of Onset   Heart attack Mother    Diabetes Mother    Lung cancer Mother    Asthma Mother    Heart disease Mother    Suicidality Father        "killed himself"   Diabetes Sister    Cancer Sister    Colon cancer Sister 73   Asthma Daughter        x 2   Cancer Daughter        pre-cancerous polyp   Cervical cancer Daughter  cervical    Allergies Other        all family--seasonal allergies    Allergies  Allergen Reactions   Prednisone Other (See Comments)    REACTION: mood swings, nightmares. "Shot doesn't bother me, reaction is just with the pill" she states she has had the steroid injections before. From our records methylprednisone was given to her in 2013 without any complications.    Medication list has been reviewed and updated.  Current Outpatient Medications on File Prior to Visit  Medication Sig Dispense Refill   Continuous Blood Gluc Sensor (DEXCOM G7 SENSOR) MISC USE TO CHECK BLOOD GLUCOSE CONTINUOUSLY. CHANGE SENSOR EVERY 10 DAYS. 9 each 1   albuterol (VENTOLIN HFA) 108 (90 Base) MCG/ACT inhaler Inhale 2 puffs into the lungs every 8 (eight) hours as needed for wheezing or shortness of breath. 18 g 5   aspirin EC 81 MG tablet Take 81 mg by mouth daily.     calcium carbonate (TUMS EX) 750 MG chewable tablet Chew 1 tablet by mouth daily as needed for heartburn.     Cholecalciferol (VITAMIN D3) 1000 UNITS CAPS Take 1,000 Units by mouth daily.     dapagliflozin propanediol (FARXIGA) 10 MG TABS tablet Take 1 tablet (10 mg total) by mouth daily. 90 tablet 3   Dulaglutide (TRULICITY) 4.5 0000000 SOPN Inject 4.5 mg as directed once a week. 6 mL 0    fexofenadine (ALLEGRA) 180 MG tablet Take 180 mg by mouth daily.     FLUoxetine (PROZAC) 40 MG capsule Take 1 capsule (40 mg total) by mouth daily. 90 capsule 1   furosemide (LASIX) 20 MG tablet Take 1 tablet (20 mg total) by mouth daily. For swelling / fluid retention 90 tablet 1   gabapentin (NEURONTIN) 300 MG capsule TAKE 2 CAPSULES BY MOUTH TWICE A DAY 360 capsule 1   hydrALAZINE (APRESOLINE) 25 MG tablet TAKE 2 TABLETS BY MOUTH 2 TIMES DAILY. FOR BLOOD PRESSURE (Patient taking differently: Take 25 mg by mouth in the morning and at bedtime.) 360 tablet 1   Insulin Glargine (BASAGLAR KWIKPEN) 100 UNIT/ML Inject 30 Units into the skin daily. Profile until patient requests 15 mL 1   Insulin Pen Needle 32G X 4 MM MISC 1 Device by Does not apply route daily. 100 each 3   Lancets (FREESTYLE) lancets Use to check blood glucose once a day 100 each 5   metFORMIN (GLUCOPHAGE-XR) 500 MG 24 hr tablet Take 2 tablets (1,000 mg total) by mouth daily with breakfast. (Patient taking differently: Take 1,000 mg by mouth 2 (two) times daily with a meal.) 180 tablet 1   metoprolol succinate (TOPROL-XL) 100 MG 24 hr tablet TAKE 1 TABLET BY MOUTH EVERY DAY 90 tablet 1   montelukast (SINGULAIR) 10 MG tablet TAKE 1 TABLET (10 MG TOTAL) BY MOUTH AT BEDTIME. FOR ALLERGIES / ASTHMA 90 tablet 1   OVER THE COUNTER MEDICATION Take 1 each by mouth daily. CBD gummy     pantoprazole (PROTONIX) 40 MG tablet TAKE 1 TABLET BY MOUTH EVERY DAY 90 tablet 1   rosuvastatin (CRESTOR) 20 MG tablet Take 1 tablet (20 mg total) by mouth daily. For cholesterol / heart 90 tablet 3   Tiotropium Bromide Monohydrate (SPIRIVA RESPIMAT) 2.5 MCG/ACT AERS INHALE 2 PUFFS BY MOUTH INTO THE LUNGS DAILY 4 g 5   valsartan (DIOVAN) 160 MG tablet Take 1 tablet (160 mg total) by mouth daily. 90 tablet 1   zinc gluconate 50 MG tablet Take 50 mg by  mouth daily. (Patient not taking: Reported on 09/06/2022)     No current facility-administered medications on  file prior to visit.    Review of Systems:  .As per HPI- otherwise negative.   Physical Examination: There were no vitals filed for this visit. There were no vitals filed for this visit. There is no height or weight on file to calculate BMI. Ideal Body Weight:    GEN: no acute distress. HEENT: Atraumatic, Normocephalic.  Ears and Nose: No external deformity. CV: RRR, No M/G/R. No JVD. No thrill. No extra heart sounds. PULM: CTA B, no wheezes, crackles, rhonchi. No retractions. No resp. distress. No accessory muscle use. ABD: S, NT, ND, +BS. No rebound. No HSM. EXTR: No c/c/e PSYCH: Normally interactive. Conversant.    Assessment and Plan: ***  Signed Lamar Blinks, MD

## 2022-09-11 ENCOUNTER — Telehealth: Payer: Self-pay | Admitting: Pharmacist

## 2022-09-11 ENCOUNTER — Telehealth: Payer: Self-pay | Admitting: *Deleted

## 2022-09-11 ENCOUNTER — Ambulatory Visit: Payer: Medicare Other | Admitting: Family Medicine

## 2022-09-11 DIAGNOSIS — I25118 Atherosclerotic heart disease of native coronary artery with other forms of angina pectoris: Secondary | ICD-10-CM

## 2022-09-11 DIAGNOSIS — J449 Chronic obstructive pulmonary disease, unspecified: Secondary | ICD-10-CM

## 2022-09-11 DIAGNOSIS — F3341 Major depressive disorder, recurrent, in partial remission: Secondary | ICD-10-CM

## 2022-09-11 DIAGNOSIS — E1159 Type 2 diabetes mellitus with other circulatory complications: Secondary | ICD-10-CM

## 2022-09-11 DIAGNOSIS — I152 Hypertension secondary to endocrine disorders: Secondary | ICD-10-CM

## 2022-09-11 NOTE — Telephone Encounter (Signed)
Caller Name [place] Phone Number [phone] Patient Name Destiny Ballard Patient DOB 1950/11/16 Call Type Message Only Information Provided Reason for Call Request to [facility] Appointment Initial Comment Caller is needing to cancel appt. Patient request to speak to RN No Additional Comment hrs Disp. Time Disposition Final User 09/11/2022 7:47:24 AM General Information Provided Yes Idolina Primer

## 2022-09-11 NOTE — Telephone Encounter (Signed)
Appt cancelled and pt rescheduled.

## 2022-09-11 NOTE — Telephone Encounter (Signed)
Called [facility] to see if patient was able to get DexCom 7 or if prior authorization was needed.  Pharmacist states that insurance was requesting additonal informaiton / office notes. They provided the following fax number [phone].  Printed last office notes from Dr [employer] and faxed to above number.

## 2022-09-12 NOTE — Patient Instructions (Addendum)
It was great to see you again today Recommend eye exam if not done in the last year Also COVID booster if not done in 6 months or so I will let Dr Fuller Plan know about your swallowing problem Assuming all is well please see me in about 6 months   It also looks like you are due for a cardiology visit- please give Dr Marthann Schiller office a call and schedule with him  Phone: [phone]

## 2022-09-12 NOTE — Progress Notes (Signed)
[facility] [address], [place], [place] [address] [phone]  Date:  09/18/2022   Name:  Destiny Ballard   DOB:  Feb 21, 1951   MRN:  [id number]  PCP:  Darreld Mclean, MD    Chief Complaint: Follow-up (Concerns/ questions: 1. Pt says she had an episode when she was coughing and felt like she could not catch her breath. 2. Dysphagia /Mammogram due- declines/Eye exam: pt says she has not had any recently. /AWV due)   History of Present Illness:  Destiny Ballard is a 72 y.o. very pleasant female patient who presents with the following:  Patient seen today for periodic follow-up- history of CAD (stents 2007, most recent cath 2019 negative) with cardiology care, hypertension, CHF, sleep apnea, COPD, diabetes, GERD, hyperlipidemia  Most recent visit with myself was in November  Pt notes that last month a piece of steak got stuck in her throat She is trying to cut her food smaller but she is still having some difficulty swallowing Liquids are not an issue No pain with swallowing except when something will get stuck Her GI is Dr Fuller Plan- he saw her last year for a colon  She is one of 9 sibs- 3 remain  She broke her wrist last May and had an ORIF- she still feels like her wrist is weaker than the other side, this is expected It looks like she is due for cardiology visit  She is taking her metformin XR 500 BID instead of 1000 daily Her glucose has seemed to be under good control - she has not seen glucose over 200 recently   Mammogram- offered to her, she declines Eye exam- she will schedule this  Recommend COVID booster if due Lab Results  Component Value Date   HGBA1C 7.3 (H) 05/20/2022   Can update A1c if she would like- will update and labs today   Aspirin 81 Farxiga 10 Trulicity 4.5 Basaglar 30 units Metformin XR 500-2 tablets daily Toprol-XL 100 Valsartan 160 Crestor 20 Fluoxetine Lasix 20 mg daily  as needed Gabapentin Hydralazine 25 twice daily Patient Active Problem List   Diagnosis Date Noted   Diabetes mellitus ([place]) 03/01/2022   Hypoxia 11/27/2021   Osteopenia 06/27/2021   Melena 12/18/2020   History of colonic polyps 12/18/2020   Constipation 12/18/2020   Obesity    Iron deficiency anemia    Insulin resistance    Hyperlipidemia    Cervical spondylosis    Anxiety    Pneumonia due to COVID-19 virus 08/21/2020   Hyperlipidemia associated with type 2 diabetes mellitus ([place]) 08/21/2020   Type 2 diabetes mellitus with diabetic polyneuropathy, with long-term current use of insulin ([place]) 12/10/2019   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   Tinnitus of both ears 11/18/2019   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   Eustachian tube dysfunction, bilateral 11/18/2019   Chronic eczematous otitis externa of both ears 11/18/2019   Obesity (BMI 30-39.9) 08/24/2018   Depression with anxiety 08/24/2018   CAD (coronary artery disease) 08/24/2018   Abnormal cardiovascular stress test 08/24/2018   Precordial chest pain    Sepsis ([place]) 09/18/2017   Chronic diastolic CHF (congestive heart failure) ([place]) 09/18/2017   Chronic rhinitis 08/07/2016   COPD with acute exacerbation ([place]) 04/25/2016   Hypertension associated with diabetes ([place]) 01/30/2016   OSA (obstructive sleep apnea) 05/06/2014   Dyspnea on exertion 09/02/2012   GERD 06/28/2008  Past Medical History:  Diagnosis Date   Anxiety    Prior suicide attempt   CAD (coronary artery disease)    a) s/p DES to LAD 07/2005 b) Last Myoview low risk 11/2011 showing small fixed apical perfusion defect (prior MI vs attenuation) but no ischemia - normal EF.   Cervical spondylosis    Chest pain 12/10/2011   Chronic diastolic CHF (congestive heart failure) (H[facility]) 09/18/2017   Chronic eczematous otitis externa of both ears 11/18/2019   Chronic rhinitis 0000000   Complication of anesthesia     developed pneumonia after nerve block was hospitalized   Constipation 12/18/2020   COPD with acute exacerbation ([place]) 04/25/2016   Coronary atherosclerosis 06/28/2008   Depression    Depression with anxiety    Diabetes mellitus ([place]) 09/08/2017   Diabetic neuropathy ([place])    Dyspnea on exertion 09/02/2012   CXR 07/2012:  No acute process.     Eustachian tube dysfunction, bilateral 11/18/2019   GERD 06/28/2008   Hearing loss    Left   History of colonic polyps 12/18/2020   History of kidney stones    HLD (hyperlipidemia)    Hyperlipidemia associated with type 2 diabetes mellitus ([place]) 08/21/2020   Hypertension    Hypertension associated with diabetes ([place]) 01/30/2016   Hypokalemia 01/27/2016   Insulin resistance    Iron deficiency anemia    Leukocytosis    history of mild leukocytosis   Lobar pneumonia, unspecified organism ([place]) 11/13/2017   Melena 12/18/2020   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   Obesity (BMI 30-39.9) 08/24/2018   Last Assessment & Plan:  Exercise and weight reduction is encouraged.   OSA (obstructive sleep apnea) 05/06/2014   Pneumonia due to COVID-19 virus 08/21/2020   Precordial chest pain    Sciatic nerve disease, left    leg   Sepsis ([place]) 09/18/2017   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   Tinnitus of both ears 11/18/2019   Type 2 diabetes mellitus with hyperglycemia, with long-term current use of insulin ([place]) 12/10/2019   Uncontrolled type 2 diabetes mellitus with complication, with long-term current use of insulin 03/14/2020    Past Surgical History:  Procedure Laterality Date   BREAST ENHANCEMENT SURGERY     CARDIAC CATHETERIZATION  06/17/2007   NORMAL. EF 60%   CARDIAC CATHETERIZATION N/A 01/29/2016   Procedure: Left Heart Cath and Coronary Angiography;  Surgeon: Sherren Mocha, MD;  Location: [facility];  Service: Cardiovascular;  Laterality: N/A;   CERVICAL SPONDYLOSIS     SINGLE LEVEL  FUSION   CHILDBIRTH     X3   COLONOSCOPY  2010   normal   COLONOSCOPY WITH PROPOFOL  10/01/2021   CORONARY STENT PLACEMENT  07/15/2005   LEFT ANTERIOR DESCENDING   FOREARM FRACTURE SURGERY  07/15/2008   hand and shoulder    INCISION AND DRAINAGE BREAST ABSCESS  01/05/2012       INCISION AND DRAINAGE PERIRECTAL ABSCESS N/A 02/18/2014   Procedure: IRRIGATION AND DEBRIDEMENT PERIRECTAL ABSCESS;  Surgeon: Pedro Earls, MD;  Location: [facility];  Service: General;  Laterality: N/A;   LEFT HEART CATH AND CORONARY ANGIOGRAPHY N/A 10/10/2017   Procedure: LEFT HEART CATH AND CORONARY ANGIOGRAPHY;  Surgeon: Burnell Blanks, MD;  Location: [facility];  Service: Cardiovascular;  Laterality: N/A;   LUMBAR LAMINECTOMY     ORIF WRIST FRACTURE Right 11/27/2021   Procedure: OPEN REDUCTION INTERNAL FIXATION (ORIF) WRIST FRACTURE;  Surgeon: Marchia Bond, MD;  Location: [facility];  Service: Orthopedics;  Laterality: Right;   ROTATOR CUFF REPAIR     bilaterla   TONSILLECTOMY AND ADENOIDECTOMY     TUBAL LIGATION     VIDEO BRONCHOSCOPY Bilateral 08/13/2016   Procedure: VIDEO BRONCHOSCOPY WITHOUT FLUORO;  Surgeon: Collene Gobble, MD;  Location: [facility];  Service: Cardiopulmonary;  Laterality: Bilateral;    Social History   Tobacco Use   Smoking status: Never    Passive exposure: Yes   Smokeless tobacco: Never  Vaping Use   Vaping Use: Never used  Substance Use Topics   Alcohol use: Yes    Comment: occ   Drug use: No    Family History  Problem Relation Age of Onset   Heart attack Mother    Diabetes Mother    Lung cancer Mother    Asthma Mother    Heart disease Mother    Suicidality Father        "killed himself"   Diabetes Sister    Cancer Sister    Colon cancer Sister 44   Asthma Daughter        x 2   Cancer Daughter        pre-cancerous polyp   Cervical cancer Daughter        cervical    Allergies Other        all family--seasonal allergies    Allergies   Allergen Reactions   Prednisone Other (See Comments)    REACTION: mood swings, nightmares. "Shot doesn't bother me, reaction is just with the pill" she states she has had the steroid injections before. From our records methylprednisone was given to her in 2013 without any complications.    Medication list has been reviewed and updated.  Current Outpatient Medications on File Prior to Visit  Medication Sig Dispense Refill   albuterol (VENTOLIN HFA) 108 (90 Base) MCG/ACT inhaler Inhale 2 puffs into the lungs every 8 (eight) hours as needed for wheezing or shortness of breath. 18 g 5   aspirin EC 81 MG tablet Take 81 mg by mouth daily.     calcium carbonate (TUMS EX) 750 MG chewable tablet Chew 1 tablet by mouth daily as needed for heartburn.     Cholecalciferol (VITAMIN D3) 1000 UNITS CAPS Take 1,000 Units by mouth daily.     Continuous Blood Gluc Sensor (DEXCOM G7 SENSOR) MISC USE TO CHECK BLOOD GLUCOSE CONTINUOUSLY. CHANGE SENSOR EVERY 10 DAYS. 9 each 1   dapagliflozin propanediol (FARXIGA) 10 MG TABS tablet Take 1 tablet (10 mg total) by mouth daily. 90 tablet 3   Dulaglutide (TRULICITY) 4.5 0000000 SOPN Inject 4.5 mg as directed once a week. 6 mL 0   fexofenadine (ALLEGRA) 180 MG tablet Take 180 mg by mouth daily.     FLUoxetine (PROZAC) 40 MG capsule Take 1 capsule (40 mg total) by mouth daily. 90 capsule 1   furosemide (LASIX) 20 MG tablet Take 1 tablet (20 mg total) by mouth daily. For swelling / fluid retention 90 tablet 1   gabapentin (NEURONTIN) 300 MG capsule TAKE 2 CAPSULES BY MOUTH TWICE A DAY 360 capsule 1   hydrALAZINE (APRESOLINE) 25 MG tablet TAKE 2 TABLETS BY MOUTH 2 TIMES DAILY. FOR BLOOD PRESSURE (Patient taking differently: Take 25 mg by mouth in the morning and at bedtime.) 360 tablet 1   Insulin Glargine (BASAGLAR KWIKPEN) 100 UNIT/ML Inject 30 Units into the skin daily. Profile until patient requests 15 mL 1   Insulin Pen Needle 32G X  4 MM MISC 1 Device by Does not  apply route daily. 100 each 3   Lancets (FREESTYLE) lancets Use to check blood glucose once a day 100 each 5   metFORMIN (GLUCOPHAGE-XR) 500 MG 24 hr tablet Take 2 tablets (1,000 mg total) by mouth daily with breakfast. (Patient taking differently: Take 1,000 mg by mouth 2 (two) times daily with a meal.) 180 tablet 1   metoprolol succinate (TOPROL-XL) 100 MG 24 hr tablet TAKE 1 TABLET BY MOUTH EVERY DAY 90 tablet 1   montelukast (SINGULAIR) 10 MG tablet TAKE 1 TABLET (10 MG TOTAL) BY MOUTH AT BEDTIME. FOR ALLERGIES / ASTHMA 90 tablet 1   OVER THE COUNTER MEDICATION Take 1 each by mouth daily. CBD gummy     pantoprazole (PROTONIX) 40 MG tablet TAKE 1 TABLET BY MOUTH EVERY DAY 90 tablet 1   rosuvastatin (CRESTOR) 20 MG tablet Take 1 tablet (20 mg total) by mouth daily. For cholesterol / heart 90 tablet 3   Tiotropium Bromide Monohydrate (SPIRIVA RESPIMAT) 2.5 MCG/ACT AERS INHALE 2 PUFFS BY MOUTH INTO THE LUNGS DAILY 4 g 5   valsartan (DIOVAN) 160 MG tablet Take 1 tablet (160 mg total) by mouth daily. 90 tablet 1   zinc gluconate 50 MG tablet Take 50 mg by mouth daily.     No current facility-administered medications on file prior to visit.    Review of Systems:  As per HPI- otherwise negative.   Physical Examination: Vitals:   09/18/22 0805  BP: 132/76  Pulse: 83  Resp: 18  Temp: 97.6 F (36.4 C)  SpO2: 94%   Vitals:   09/18/22 0805  Weight: 165 lb 6.4 oz (75 kg)  Height: '4\' 11"'$  (1.499 m)   Body mass index is 33.41 kg/m. Ideal Body Weight: Weight in (lb) to have BMI = 25: 123.5  GEN: no acute distress.  Looks well, obese  HEENT: Atraumatic, Normocephalic. Bilateral TM wnl, oropharynx normal.  PEERL,EOMI.   Ears and Nose: No external deformity. CV: RRR, No M/G/R. No JVD. No thrill. No extra heart sounds. PULM: CTA B, no wheezes, crackles, rhonchi. No retractions. No resp. distress. No accessory muscle use. ABD: S, NT, ND, +BS. No rebound. No HSM. EXTR: No c/c/e PSYCH:  Normally interactive. Conversant.  Good pulses both feet, nails are long but no ulceration 3/5 monofilament left, 5/5 right  Dry skin on both feet   Assessment and Plan: Type 2 diabetes mellitus with other circulatory complication, with long-term current use of insulin ([place]) - Plan: HgB A1c, Comprehensive metabolic panel, Lipid panel  COPD without exacerbation (HCC)  Hypertension associated with diabetes ([place]) - Plan: CBC  Coronary artery disease involving native heart with other form of angina pectoris, unspecified vessel or lesion type (HCC)  Recurrent major depressive disorder, in full remission ([place])  Chronic diastolic CHF (congestive heart failure) (HCC)  Hyperlipidemia, unspecified hyperlipidemia type  Fatigue, unspecified type - Plan: TSH  Dry skin - Plan: Ambulatory referral to Dermatology  Dysphagia, unspecified type  Patient seen today for periodic follow-up.  Her last A1c looks good, we will follow-up today.  There has been some discussion of getting a continuous glucose monitor for this patient but if her A1c continues to look good this is probably not necessary She is currently using Farxiga 10, Trulicity 4.5, insulin glargine 30 units, metformin XR 1000 daily COPD is stable- spiriva   Blood pressure well-controlled  I sent a message to patient's gastroenterologist about her difficulty swallowing and requested a  visit  Asked her to contact her cardiologist for follow-up  Patient has declined a mammogram  She does agree to get her eyes checked ASAP  Assuming all is well plan to recheck in about 6 months  Further follow-up pending labs Signed Lamar Blinks, MD  Received labs as below, message to patient  Results for orders placed or performed in visit on 09/18/22  HgB A1c  Result Value Ref Range   Hgb A1c MFr Bld 7.2 (H) 4.6 - 6.5 %  CBC  Result Value Ref Range   WBC 9.8 4.0 - 10.5 K/uL   RBC 5.13 (H) 3.87 - 5.11 Mil/uL   Platelets 281.0 150.0 -  400.0 K/uL   Hemoglobin 13.2 12.0 - 15.0 g/dL   HCT 41.0 36.0 - 46.0 %   MCV 79.9 78.0 - 100.0 fl   MCHC 32.2 30.0 - 36.0 g/dL   RDW 14.3 11.5 - 15.5 %  Comprehensive metabolic panel  Result Value Ref Range   Sodium 142 135 - 145 mEq/L   Potassium 4.2 3.5 - 5.1 mEq/L   Chloride 97 96 - 112 mEq/L   CO2 34 (H) 19 - 32 mEq/L   Glucose, Bld 136 (H) 70 - 99 mg/dL   BUN 10 6 - 23 mg/dL   Creatinine, Ser 0.70 0.40 - 1.20 mg/dL   Total Bilirubin 0.4 0.2 - 1.2 mg/dL   Alkaline Phosphatase 82 39 - 117 U/L   AST 18 0 - 37 U/L   ALT 16 0 - 35 U/L   Total Protein 7.1 6.0 - 8.3 g/dL   Albumin 4.0 3.5 - 5.2 g/dL   GFR 85.86 >60.00 mL/min   Calcium 9.9 8.4 - 10.5 mg/dL  Lipid panel  Result Value Ref Range   Cholesterol 122 0 - 200 mg/dL   Triglycerides 251.0 (H) 0.0 - 149.0 mg/dL   HDL 46.00 >39.00 mg/dL   VLDL 50.2 (H) 0.0 - 40.0 mg/dL   Total CHOL/HDL Ratio 3    NonHDL 76.02   TSH  Result Value Ref Range   TSH 1.08 0.35 - 5.50 uIU/mL  LDL cholesterol, direct  Result Value Ref Range   Direct LDL 51.0 mg/dL

## 2022-09-18 ENCOUNTER — Encounter: Payer: Self-pay | Admitting: Family Medicine

## 2022-09-18 ENCOUNTER — Telehealth: Payer: Self-pay

## 2022-09-18 ENCOUNTER — Ambulatory Visit (INDEPENDENT_AMBULATORY_CARE_PROVIDER_SITE_OTHER): Payer: Medicare Other | Admitting: Family Medicine

## 2022-09-18 VITALS — BP 132/76 | HR 83 | Temp 97.6°F | Resp 18 | Ht 59.0 in | Wt 165.4 lb

## 2022-09-18 DIAGNOSIS — I25118 Atherosclerotic heart disease of native coronary artery with other forms of angina pectoris: Secondary | ICD-10-CM

## 2022-09-18 DIAGNOSIS — Z794 Long term (current) use of insulin: Secondary | ICD-10-CM | POA: Diagnosis not present

## 2022-09-18 DIAGNOSIS — E785 Hyperlipidemia, unspecified: Secondary | ICD-10-CM | POA: Diagnosis not present

## 2022-09-18 DIAGNOSIS — F3342 Major depressive disorder, recurrent, in full remission: Secondary | ICD-10-CM | POA: Diagnosis not present

## 2022-09-18 DIAGNOSIS — L853 Xerosis cutis: Secondary | ICD-10-CM

## 2022-09-18 DIAGNOSIS — I152 Hypertension secondary to endocrine disorders: Secondary | ICD-10-CM

## 2022-09-18 DIAGNOSIS — J449 Chronic obstructive pulmonary disease, unspecified: Secondary | ICD-10-CM

## 2022-09-18 DIAGNOSIS — I5032 Chronic diastolic (congestive) heart failure: Secondary | ICD-10-CM

## 2022-09-18 DIAGNOSIS — R1310 Dysphagia, unspecified: Secondary | ICD-10-CM | POA: Diagnosis not present

## 2022-09-18 DIAGNOSIS — E1159 Type 2 diabetes mellitus with other circulatory complications: Secondary | ICD-10-CM | POA: Diagnosis not present

## 2022-09-18 DIAGNOSIS — R5383 Other fatigue: Secondary | ICD-10-CM

## 2022-09-18 LAB — LIPID PANEL
Cholesterol: 122 mg/dL (ref 0–200)
HDL: 46 mg/dL (ref >39.00)
NonHDL: 76.02
Total CHOL/HDL Ratio: 3
Triglycerides: 251 mg/dL — ABNORMAL HIGH (ref 0.0–149.0)
VLDL: 50.2 mg/dL — ABNORMAL HIGH (ref 0.0–40.0)

## 2022-09-18 LAB — CBC
HCT: 41 % (ref 36.0–46.0)
Hemoglobin: 13.2 g/dL (ref 12.0–15.0)
MCHC: 32.2 g/dL (ref 30.0–36.0)
MCV: 79.9 fl (ref 78.0–100.0)
Platelets: 281 10*3/uL (ref 150.0–400.0)
RBC: 5.13 Mil/uL — ABNORMAL HIGH (ref 3.87–5.11)
RDW: 14.3 % (ref 11.5–15.5)
WBC: 9.8 10*3/uL (ref 4.0–10.5)

## 2022-09-18 LAB — HEMOGLOBIN A1C: Hgb A1c MFr Bld: 7.2 % — ABNORMAL HIGH (ref 4.6–6.5)

## 2022-09-18 LAB — COMPREHENSIVE METABOLIC PANEL
ALT: 16 U/L (ref 0–35)
AST: 18 U/L (ref 0–37)
Albumin: 4 g/dL (ref 3.5–5.2)
Alkaline Phosphatase: 82 U/L (ref 39–117)
BUN: 10 mg/dL (ref 6–23)
CO2: 34 mEq/L — ABNORMAL HIGH (ref 19–32)
Calcium: 9.9 mg/dL (ref 8.4–10.5)
Chloride: 97 mEq/L (ref 96–112)
Creatinine, Ser: 0.7 mg/dL (ref 0.40–1.20)
GFR: 85.86 mL/min (ref >60.00)
Glucose, Bld: 136 mg/dL — ABNORMAL HIGH (ref 70–99)
Potassium: 4.2 mEq/L (ref 3.5–5.1)
Sodium: 142 mEq/L (ref 135–145)
Total Bilirubin: 0.4 mg/dL (ref 0.2–1.2)
Total Protein: 7.1 g/dL (ref 6.0–8.3)

## 2022-09-18 LAB — TSH: TSH: 1.08 u[IU]/mL (ref 0.35–5.50)

## 2022-09-18 LAB — LDL CHOLESTEROL, DIRECT: Direct LDL: 51 mg/dL

## 2022-09-18 NOTE — Telephone Encounter (Signed)
-----   Message from Ladene Artist, MD sent at 09/18/2022  8:35 AM EST ----- Keane Scrape,  We will contact her to further evaluate.  MS  Estill Bamberg, Please schedule an office appt with me or an APP.  MS  ----- Message ----- From: Darreld Mclean, MD Sent: 09/18/2022   8:33 AM EST To: Ladene Artist, MD  Hi Norberto Sorenson- hope you are well!  Destiny Ballard has complaint of food getting stuck when she will swallow for about a month, can you guys please get her in for a visit?  Thank you! [name]

## 2022-09-18 NOTE — Telephone Encounter (Signed)
Patient notified of appointment scheduled with Ellouise Newer, PA-C on 09-20-22 at 11:30am to further evaluate.  Patient agreed to plan and verbalized understanding.  No further questions.

## 2022-09-19 DIAGNOSIS — H10502 Unspecified blepharoconjunctivitis, left eye: Secondary | ICD-10-CM | POA: Diagnosis not present

## 2022-09-19 DIAGNOSIS — E119 Type 2 diabetes mellitus without complications: Secondary | ICD-10-CM | POA: Diagnosis not present

## 2022-09-19 DIAGNOSIS — H52203 Unspecified astigmatism, bilateral: Secondary | ICD-10-CM | POA: Diagnosis not present

## 2022-09-19 LAB — HM DIABETES EYE EXAM

## 2022-09-20 ENCOUNTER — Encounter: Payer: Self-pay | Admitting: Physician Assistant

## 2022-09-20 ENCOUNTER — Ambulatory Visit (INDEPENDENT_AMBULATORY_CARE_PROVIDER_SITE_OTHER): Payer: Medicare Other | Admitting: Physician Assistant

## 2022-09-20 VITALS — BP 130/68 | HR 65 | Ht 59.0 in | Wt 166.0 lb

## 2022-09-20 DIAGNOSIS — K219 Gastro-esophageal reflux disease without esophagitis: Secondary | ICD-10-CM

## 2022-09-20 DIAGNOSIS — R1013 Epigastric pain: Secondary | ICD-10-CM | POA: Diagnosis not present

## 2022-09-20 DIAGNOSIS — R1310 Dysphagia, unspecified: Secondary | ICD-10-CM

## 2022-09-20 DIAGNOSIS — R140 Abdominal distension (gaseous): Secondary | ICD-10-CM | POA: Diagnosis not present

## 2022-09-20 MED ORDER — PANTOPRAZOLE SODIUM 40 MG PO TBEC: 40.0000 mg | DELAYED_RELEASE_TABLET | Freq: Two times a day (BID) | ORAL | 1 refills | Status: DC

## 2022-09-20 NOTE — Progress Notes (Signed)
Chief Complaint: Dysphagia  HPI:    Destiny Ballard is a 72 year old female, known to Dr. Fuller Plan, with a past medical history as listed below including CAD, CHF, COPD, diabetes and multiple others, who was referred to me by Copland, Gay Filler, MD for a complaint of dysphagia.      07/27/2008 EGD with normal duodenum, esophagitis in the distal esophagus and 3 to 4 mm polyps in the stomach as well as mild gastritis.    10/01/2021 colonoscopy with Dr. Fuller Plan with three 6-10 mm polyps in the descending, ascending colon and cecum as well as in hemorrhoids.  Pathology showed tubular adenomas.    09/18/2022 CMP with glucose 136 and otherwise normal, CBC normal, hemoglobin A1c 7.2, TSH normal.    09/18/2022 follow-up with PCP at that time described an instance where a piece of steak got stuck in her throat.  Still having difficulty swallowing even though she is cutting her food smaller.   Today, patient explains that she has noticed some difficulty with swallowing over the past month, had 1 acute instance where she ate a piece of steak and it got stuck in her throat and she had to really work to get it to go down, since then she has to be very careful, meats tend to be worse than anything else.  Along with this has noticed some increase in belching and some epigastric discomfort/pain and gas which is worse if she bends over.  She is on Pantoprazole 40 mg once a day which she has been on for a long time.    Denies fever, chills, weight loss or blood in her stool.  Past Medical History:  Diagnosis Date   Anxiety    Prior suicide attempt   CAD (coronary artery disease)    a) s/p DES to LAD 07/2005 b) Last Myoview low risk 11/2011 showing small fixed apical perfusion defect (prior MI vs attenuation) but no ischemia - normal EF.   Cervical spondylosis    Chest pain 12/10/2011   Chronic diastolic CHF (congestive heart failure) (HCC) 09/18/2017   Chronic eczematous otitis externa of both ears 11/18/2019   Chronic  rhinitis 0000000   Complication of anesthesia    developed pneumonia after nerve block was hospitalized   Constipation 12/18/2020   COPD with acute exacerbation ([place]) 04/25/2016   Coronary atherosclerosis 06/28/2008   Depression    Depression with anxiety    Diabetes mellitus ([place]) 09/08/2017   Diabetic neuropathy ([place])    Dyspnea on exertion 09/02/2012   CXR 07/2012:  No acute process.     Eustachian tube dysfunction, bilateral 11/18/2019   GERD 06/28/2008   Hearing loss    Left   History of colonic polyps 12/18/2020   History of kidney stones    HLD (hyperlipidemia)    Hyperlipidemia associated with type 2 diabetes mellitus ([place]) 08/21/2020   Hypertension    Hypertension associated with diabetes ([place]) 01/30/2016   Hypokalemia 01/27/2016   Insulin resistance    Iron deficiency anemia    Leukocytosis    history of mild leukocytosis   Lobar pneumonia, unspecified organism ([place]) 11/13/2017   Melena 12/18/2020   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   Obesity (BMI 30-39.9) 08/24/2018   Last Assessment & Plan:  Exercise and weight reduction is encouraged.   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 EF 60%   CARDIAC CATHETERIZATION N/A 01/29/2016   Procedure: Left Heart Cath and Coronary Angiography;  Surgeon: Sherren Mocha, MD;  Location: [facility];  Service: Cardiovascular;   Laterality: N/A;   CERVICAL SPONDYLOSIS     SINGLE LEVEL FUSION   CHILDBIRTH     X3   COLONOSCOPY  2010   normal   COLONOSCOPY WITH PROPOFOL  10/01/2021   CORONARY STENT PLACEMENT  07/15/2005   LEFT ANTERIOR DESCENDING   FOREARM FRACTURE SURGERY  07/15/2008   hand and shoulder    INCISION AND DRAINAGE BREAST ABSCESS  01/05/2012       INCISION AND DRAINAGE PERIRECTAL ABSCESS N/A 02/18/2014   Procedure: IRRIGATION AND DEBRIDEMENT PERIRECTAL ABSCESS;  Surgeon: Pedro Earls, MD;  Location: [facility];  Service: General;  Laterality: N/A;   LEFT HEART CATH AND CORONARY ANGIOGRAPHY N/A 10/10/2017   Procedure: LEFT HEART CATH AND CORONARY ANGIOGRAPHY;  Surgeon: Burnell Blanks, MD;  Location: [facility];  Service: Cardiovascular;  Laterality: N/A;   LUMBAR LAMINECTOMY     ORIF WRIST FRACTURE Right 11/27/2021   Procedure: OPEN REDUCTION INTERNAL FIXATION (ORIF) WRIST FRACTURE;  Surgeon: Marchia Bond, MD;  Location: [facility];  Service: Orthopedics;  Laterality: Right;   ROTATOR CUFF REPAIR     [place]   TONSILLECTOMY AND ADENOIDECTOMY     TUBAL LIGATION     VIDEO BRONCHOSCOPY Bilateral 08/13/2016   Procedure: VIDEO BRONCHOSCOPY WITHOUT FLUORO;  Surgeon: Collene Gobble, MD;  Location: [facility];  Service: Cardiopulmonary;  Laterality: Bilateral;    Current Outpatient Medications  Medication Sig Dispense Refill   albuterol (VENTOLIN HFA) 108 (90 Base) MCG/ACT inhaler Inhale 2 puffs into the lungs every 8 (eight) hours as needed for wheezing or shortness of breath. 18 g 5   aspirin EC 81 MG tablet Take 81 mg by mouth daily.     calcium carbonate (TUMS EX) 750 MG chewable tablet Chew 1 tablet by mouth daily as needed for heartburn.     Cholecalciferol (VITAMIN D3) 1000 UNITS CAPS Take 1,000 Units by mouth daily.     Continuous Blood Gluc Sensor (DEXCOM G7 SENSOR) MISC USE TO CHECK BLOOD GLUCOSE CONTINUOUSLY. CHANGE SENSOR EVERY 10 DAYS. 9 each 1   dapagliflozin propanediol  (FARXIGA) 10 MG TABS tablet Take 1 tablet (10 mg total) by mouth daily. 90 tablet 3   Dulaglutide (TRULICITY) 4.5 0000000 SOPN Inject 4.5 mg as directed once a week. 6 mL 0   fexofenadine (ALLEGRA) 180 MG tablet Take 180 mg by mouth daily.     FLUoxetine (PROZAC) 40 MG capsule Take 1 capsule (40 mg total) by mouth daily. 90 capsule 1   furosemide (LASIX) 20 MG tablet Take 1 tablet (20 mg total) by mouth daily. For swelling / fluid retention 90 tablet 1   gabapentin (NEURONTIN) 300 MG capsule TAKE 2 CAPSULES BY MOUTH TWICE A DAY 360 capsule 1   hydrALAZINE (APRESOLINE) 25 MG tablet TAKE 2 TABLETS BY MOUTH 2 TIMES DAILY. FOR BLOOD PRESSURE (Patient taking differently: Take 25 mg  by mouth in the morning and at bedtime.) 360 tablet 1   Insulin Glargine (BASAGLAR KWIKPEN) 100 UNIT/ML Inject 30 Units into the skin daily. Profile until patient requests 15 mL 1   Insulin Pen Needle 32G X 4 MM MISC 1 Device by Does not apply route daily. 100 each 3   Lancets (FREESTYLE) lancets Use to check blood glucose once a day 100 each 5   metFORMIN (GLUCOPHAGE-XR) 500 MG 24 hr tablet Take 2 tablets (1,000 mg total) by mouth daily with breakfast. (Patient taking differently: Take 1,000 mg by mouth 2 (two) times daily with a meal.) 180 tablet 1   metoprolol succinate (TOPROL-XL) 100 MG 24 hr tablet TAKE 1 TABLET BY MOUTH EVERY DAY 90 tablet 1   montelukast (SINGULAIR) 10 MG tablet TAKE 1 TABLET (10 MG TOTAL) BY MOUTH AT BEDTIME. FOR ALLERGIES / ASTHMA 90 tablet 1   OVER THE COUNTER MEDICATION Take 1 each by mouth daily. CBD gummy     pantoprazole (PROTONIX) 40 MG tablet TAKE 1 TABLET BY MOUTH EVERY DAY 90 tablet 1   rosuvastatin (CRESTOR) 20 MG tablet Take 1 tablet (20 mg total) by mouth daily. For cholesterol / heart 90 tablet 3   Tiotropium Bromide Monohydrate (SPIRIVA RESPIMAT) 2.5 MCG/ACT AERS INHALE 2 PUFFS BY MOUTH INTO THE LUNGS DAILY 4 g 5   valsartan (DIOVAN) 160 MG tablet Take 1 tablet (160 mg total) by mouth  daily. 90 tablet 1   zinc gluconate 50 MG tablet Take 50 mg by mouth daily.     No current facility-administered medications for this visit.    Allergies as of 09/20/2022 - Review Complete 09/18/2022  Allergen Reaction Noted   Prednisone Other (See Comments)     Family History  Problem Relation Age of Onset   Heart attack Mother    Diabetes Mother    Lung cancer Mother    Asthma Mother    Heart disease Mother    Suicidality Father        "killed himself"   Diabetes Sister    Cancer Sister    Colon cancer Sister 34   Asthma Daughter        x 2   Cancer Daughter        pre-cancerous polyp   Cervical cancer Daughter        cervical    Allergies Other        all family--seasonal allergies    Social History   Socioeconomic History   Marital status: Widowed    Spouse name: Lake Bells   Number of children: 3   Years of education: 12   Highest education level: Not on file  Occupational History   Occupation: retired from [place]: 11/2012  Tobacco Use   Smoking status: Never    Passive exposure: Yes   Smokeless tobacco: Never  Vaping Use   Vaping Use: Never used  Substance and Sexual Activity   Alcohol use: Yes    Comment: occ   Drug use: No   Sexual activity: Not Currently    Partners: Male    Birth control/protection: None  Other Topics Concern   Not on file  Social History Narrative   Their eldest daughter lives upstairs.   Social Determinants of Health   Financial Resource Strain: Medium Risk (08/20/2021)   Overall Financial Resource Strain (CARDIA)    Difficulty of Paying Living Expenses: Somewhat hard  Food Insecurity: No Food Insecurity (08/31/2020)   Hunger Vital Sign  Worried About Charity fundraiser in the Last Year: Never true    [place] in the Last Year: Never true  Transportation Needs: No Transportation Needs (08/31/2020)   PRAPARE - Hydrologist (Medical): No    Lack of Transportation (Non-Medical):  No  Physical Activity: Inactive (03/12/2021)   Exercise Vital Sign    Days of Exercise per Week: 0 days    Minutes of Exercise per Session: 0 min  Stress: No Stress Concern Present (06/14/2021)   [place]    Feeling of Stress : Only a little  Social Connections: Socially Isolated (06/14/2021)   Social Connection and Isolation Panel [NHANES]    Frequency of Communication with Friends and Family: More than three times a week    Frequency of Social Gatherings with Friends and Family: More than three times a week    Attends Religious Services: Never    Marine scientist or Organizations: No    Attends Archivist Meetings: Never    Marital Status: Widowed  Intimate Partner Violence: Not At Risk (06/14/2021)   Humiliation, Afraid, Rape, and Kick questionnaire    Fear of Current or Ex-Partner: No    Emotionally Abused: No    Physically Abused: No    Sexually Abused: No    Review of Systems:    Constitutional: No weight loss, fever or chills Skin: No rash  Cardiovascular: No chest pain  Respiratory: No SOB  Gastrointestinal: See HPI and otherwise negative Genitourinary: No dysuria Neurological: No headache, dizziness or syncope Musculoskeletal: No new muscle or joint pain Hematologic: No bleeding  Psychiatric: No history of depression or anxiety   Physical Exam:  Vital signs: BP 130/68   Pulse 65   Ht '4\' 11"'$  (1.499 m)   Wt 166 lb (75.3 kg)   SpO2 98%   BMI 33.53 kg/m    Constitutional:   Pleasant overweight Caucasian female appears to be in NAD, Well developed, Well nourished, alert and cooperative Head:  Normocephalic and atraumatic. Eyes:   PEERL, EOMI. No icterus. Conjunctiva pink. Ears:  Normal auditory acuity. Neck:  Supple Throat: Oral cavity and pharynx without inflammation, swelling or lesion.  Respiratory: Respirations even and unlabored. Lungs clear to auscultation bilaterally.   No  wheezes, crackles, or rhonchi.  Cardiovascular: Normal S1, S2. No MRG. Regular rate and rhythm. No peripheral edema, cyanosis or pallor.  Gastrointestinal:  Soft, nondistended, nontender. No rebound or guarding. Normal bowel sounds. No appreciable masses or hepatomegaly. Rectal:  Not performed.  Msk:  Symmetrical without gross deformities. Without edema, no deformity or joint abnormality.  Neurologic:  Alert and  oriented x4;  grossly normal neurologically.  Skin:   Dry and intact without significant lesions or rashes. Psychiatric: Demonstrates good judgement and reason without abnormal affect or behaviors.  RELEVANT LABS AND IMAGING: CBC    Component Value Date/Time   WBC 9.8 09/18/2022 0832   RBC 5.13 (H) 09/18/2022 0832   HGB 13.2 09/18/2022 0832   HGB 12.0 02/09/2021 1608   HCT 41.0 09/18/2022 0832   HCT 39.9 02/09/2021 1608   PLT 281.0 09/18/2022 0832   PLT 265 02/09/2021 1608   MCV 79.9 09/18/2022 0832   MCV 83 02/09/2021 1608   MCH 25.4 (L) 11/29/2021 0645   MCHC 32.2 09/18/2022 0832   RDW 14.3 09/18/2022 0832   RDW 14.8 02/09/2021 1608   LYMPHSABS 3.0 11/18/2020 1830   LYMPHSABS  2.6 10/06/2017 0847   MONOABS 0.9 11/18/2020 1830   EOSABS 0.3 11/18/2020 1830   EOSABS 0.2 10/06/2017 0847   BASOSABS 0.0 11/18/2020 1830   BASOSABS 0.0 10/06/2017 0847    CMP     Component Value Date/Time   NA 142 09/18/2022 0832   NA 143 02/09/2021 1608   K 4.2 09/18/2022 0832   CL 97 09/18/2022 0832   CO2 34 (H) 09/18/2022 0832   GLUCOSE 136 (H) 09/18/2022 0832   BUN 10 09/18/2022 0832   BUN 13 02/09/2021 1608   CREATININE 0.70 09/18/2022 0832   CREATININE 0.73 11/16/2019 1006   CREATININE 0.65 03/01/2016 1458   CALCIUM 9.9 09/18/2022 0832   PROT 7.1 09/18/2022 0832   PROT 6.3 02/09/2021 1608   ALBUMIN 4.0 09/18/2022 0832   ALBUMIN 3.9 02/09/2021 1608   AST 18 09/18/2022 0832   AST 14 (L) 11/16/2019 1006   ALT 16 09/18/2022 0832   ALT 14 11/16/2019 1006   ALKPHOS 82  09/18/2022 0832   BILITOT 0.4 09/18/2022 0832   BILITOT <0.2 02/09/2021 1608   BILITOT 0.3 11/16/2019 1006   GFRNONAA >60 11/29/2021 0312   GFRNONAA >60 11/16/2019 1006   GFRAA >60 03/18/2020 0655   GFRAA >60 11/16/2019 1006    Assessment: 1.  Dysphagia: Over the past month or so, also with chronic reflux; likely stricture versus dysmotility versus esophagitis 2.  Bloating/gas: With above, likely related to gastritis 3.  Epigastric discomfort: With above  Plan: 1.  Scheduled patient for barium esophagram with tablet for further evaluation of dysphagia symptoms.  Pending this could consider an EGD with dilation. 2.  Increased Pantoprazole to 40 mg twice daily, 30-60 minutes before breakfast and dinner.  #60 with 3 refills. 3.  Patient to follow in clinic per recommendations after imaging above.  Ellouise Newer, PA-C [facility] 09/20/2022, 11:16 AM  Cc: Darreld Mclean, MD

## 2022-09-20 NOTE — Patient Instructions (Signed)
_______________________________________________________  If your blood pressure at your visit was 140/90 or greater, please contact your primary care physician to follow up on this.  _______________________________________________________  If you are age 74 or older, your body mass index should be between 23-30. Your Body mass index is 33.53 kg/m. If this is out of the aforementioned range listed, please consider follow up with your Primary Care Provider.  If you are age 68 or younger, your body mass index should be between 19-25. Your Body mass index is 33.53 kg/m. If this is out of the aformentioned range listed, please consider follow up with your Primary Care Provider.   ________________________________________________________  The [facility] providers would like to encourage you to use [facility] to communicate with providers for non-urgent requests or questions.  Due to long hold times on the telephone, sending your provider a message by [facility] may be a faster and more efficient way to get a response.  Please allow 48 business hours for a response.  Please remember that this is for non-urgent requests.  _______________________________________________________  Destiny Ballard have been scheduled for a Barium Esophogram at [facility] (1st floor of the hospital) on 09-25-2022 at 2pm. Please arrive 30 minutes prior to your appointment for registration. If you need to reschedule for any reason, please contact radiology at [phone] to do so. __________________________________________________________________ A barium swallow is an examination that concentrates on views of the esophagus. This tends to be a double contrast exam (barium and two liquids which, when combined, create a gas to distend the wall of the oesophagus) or single contrast (non-ionic iodine based). The study is usually tailored to your symptoms so a good history is essential. Attention is paid during the study to the form,  structure and configuration of the esophagus, looking for functional disorders (such as aspiration, dysphagia, achalasia, motility and reflux) EXAMINATION You may be asked to change into a gown, depending on the type of swallow being performed. A radiologist and radiographer will perform the procedure. The radiologist will advise you of the type of contrast selected for your procedure and direct you during the exam. You will be asked to stand, sit or lie in several different positions and to hold a small amount of fluid in your mouth before being asked to swallow while the imaging is performed .In some instances you may be asked to swallow barium coated marshmallows to assess the motility of a solid food bolus. The exam can be recorded as a digital or video fluoroscopy procedure. POST PROCEDURE It will take 1-2 days for the barium to pass through your system. To facilitate this, it is important, unless otherwise directed, to increase your fluids for the next 24-48hrs and to resume your normal diet.  This test typically takes about 30 minutes to perform. __________________________________________________________________________________  Due to recent changes in healthcare laws, you may see the results of your imaging and laboratory studies on MyChart before your provider has had a chance to review them.  We understand that in some cases there may be results that are confusing or concerning to you. Not all laboratory results come back in the same time frame and the provider may be waiting for multiple results in order to interpret others.  Please give [place] 48 hours in order for your provider to thoroughly review all the results before contacting the office for clarification of your results.   Please increase your pantoprazole to 40 mg twice a day.  It was a pleasure to see you today!  Thank you for trusting me with your gastrointestinal care!

## 2022-09-25 ENCOUNTER — Ambulatory Visit (HOSPITAL_COMMUNITY)
Admission: RE | Admit: 2022-09-25 | Discharge: 2022-09-25 | Disposition: A | Discharge status: Home / Self Care | Payer: Medicare Other | Source: Ambulatory Visit | Attending: Physician Assistant | Admitting: Physician Assistant

## 2022-09-25 DIAGNOSIS — K219 Gastro-esophageal reflux disease without esophagitis: Secondary | ICD-10-CM | POA: Insufficient documentation

## 2022-09-25 DIAGNOSIS — R1013 Epigastric pain: Secondary | ICD-10-CM | POA: Insufficient documentation

## 2022-09-25 DIAGNOSIS — R140 Abdominal distension (gaseous): Secondary | ICD-10-CM | POA: Diagnosis not present

## 2022-09-25 DIAGNOSIS — R1310 Dysphagia, unspecified: Secondary | ICD-10-CM | POA: Insufficient documentation

## 2022-09-26 ENCOUNTER — Other Ambulatory Visit: Payer: Self-pay

## 2022-09-26 DIAGNOSIS — R1310 Dysphagia, unspecified: Secondary | ICD-10-CM

## 2022-09-27 ENCOUNTER — Other Ambulatory Visit: Payer: Medicare Other | Admitting: Pharmacist

## 2022-09-30 ENCOUNTER — Telehealth: Payer: Self-pay | Admitting: Family Medicine

## 2022-09-30 NOTE — Telephone Encounter (Signed)
[facility] called to make [place] aware that a script request was sent on behalf of the patient for a continuous glucose monitor (freestyle libre 3). Fax number was verified.

## 2022-10-01 ENCOUNTER — Telehealth: Payer: Self-pay | Admitting: Pharmacist

## 2022-10-01 NOTE — Telephone Encounter (Signed)
Form in folder for review.

## 2022-10-01 NOTE — Telephone Encounter (Signed)
Received signed order for Continuous Glucose Monitor. Faxed order and office notes requested to [facility].

## 2022-10-02 ENCOUNTER — Ambulatory Visit (AMBULATORY_SURGERY_CENTER): Payer: Medicare Other | Admitting: Gastroenterology

## 2022-10-02 ENCOUNTER — Encounter: Payer: Self-pay | Admitting: Gastroenterology

## 2022-10-02 VITALS — BP 152/78 | HR 82 | Temp 98.1°F | Resp 15 | Ht 59.0 in | Wt 166.0 lb

## 2022-10-02 DIAGNOSIS — K219 Gastro-esophageal reflux disease without esophagitis: Secondary | ICD-10-CM

## 2022-10-02 DIAGNOSIS — K2970 Gastritis, unspecified, without bleeding: Secondary | ICD-10-CM

## 2022-10-02 DIAGNOSIS — R1310 Dysphagia, unspecified: Secondary | ICD-10-CM | POA: Diagnosis not present

## 2022-10-02 DIAGNOSIS — K2950 Unspecified chronic gastritis without bleeding: Secondary | ICD-10-CM | POA: Diagnosis not present

## 2022-10-02 MED ORDER — SODIUM CHLORIDE 0.9 % IV SOLN: 500.0000 mL | Freq: Once | INTRAVENOUS | Status: DC

## 2022-10-02 NOTE — Op Note (Signed)
[place] Patient Name: Destiny Ballard Procedure Date: 10/02/2022 3:23 PM MRN: [id number] Endoscopist: Ladene Artist , MD, [id number] Age: 72 Referring MD:  Date of Birth: 1950/10/11 Gender: Female Account #: [id number] Procedure:                Upper GI endoscopy Indications:              Dysphagia, Gastroesophageal reflux disease Medicines:                Monitored Anesthesia Care Procedure:                Pre-Anesthesia Assessment:                           - Prior to the procedure, a History and Physical                            was performed, and patient medications and                            allergies were reviewed. The patient's tolerance of                            previous anesthesia was also reviewed. The risks                            and benefits of the procedure and the sedation                            options and risks were discussed with the patient.                            All questions were answered, and informed consent                            was obtained. Prior Anticoagulants: The patient has                            taken no anticoagulant or antiplatelet agents. ASA                            Grade Assessment: III - A patient with severe                            systemic disease. After reviewing the risks and                            benefits, the patient was deemed in satisfactory                            condition to undergo the procedure.                           After obtaining informed consent, the endoscope was  passed under direct vision. Throughout the                            procedure, the patient's blood pressure, pulse, and                            oxygen saturations were monitored continuously. The                            GIF [id number] was introduced through the                            mouth, and advanced to the second part of duodenum.                            The  upper GI endoscopy was accomplished without                            difficulty. The patient tolerated the procedure                            well. Scope In: Scope Out: Findings:                 No endoscopic abnormality was evident in the                            esophagus to explain the patient's complaint of                            dysphagia. It was decided, however, to proceed with                            dilation of the entire esophagus. A guidewire was                            placed and the scope was withdrawn. Dilation was                            performed with a Savary dilator with no resistance                            at 17 mm. No heme.                           Diffuse mild inflammation characterized by                            friability and granularity was found in the gastric                            fundus and in the gastric body. Biopsies were taken  with a cold forceps for histology.                           Diffuse atrophic mucosa was found in the gastric                            fundus and in the gastric body. Biopsies were taken                            with a cold forceps for histology.                           The exam of the stomach was otherwise normal.                           The duodenal bulb and second portion of the                            duodenum were normal. Complications:            No immediate complications. Estimated Blood Loss:     Estimated blood loss was minimal. Impression:               - No endoscopic esophageal abnormality to explain                            patient's dysphagia. Esophagus dilated.                           - Gastritis. Biopsied.                           - Gastric mucosal atrophy. Biopsied.                           - Normal duodenal bulb and second portion of the                            duodenum. Recommendation:           - Patient has a contact number available  for                            emergencies. The signs and symptoms of potential                            delayed complications were discussed with the                            patient. Return to normal activities tomorrow.                            Written discharge instructions were provided to the                            patient.                           -  Clear liquid diet for 2 hours, then advance as                            tolerated to soft diet today.                           - Resume prior diet tomorrow.                           - Follow antireflux measures.                           - Continue present medications.                           - Await pathology results. Ladene Artist, MD 10/02/2022 3:47:39 PM This report has been signed electronically.

## 2022-10-02 NOTE — Progress Notes (Signed)
Called to room to assist during endoscopic procedure.  Patient ID and intended procedure confirmed with present staff. Received instructions for my participation in the procedure from the performing physician.  

## 2022-10-02 NOTE — Patient Instructions (Signed)
Handouts Provided:  Post dilation Diet. Clear liquid diet for 2 hours, then advance as tolerated to soft diet today. Follow antireflux measures.  YOU HAD AN ENDOSCOPIC PROCEDURE TODAY AT [facility]:   Refer to the procedure report that was given to you for any specific questions about what was found during the examination.  If the procedure report does not answer your questions, please call your gastroenterologist to clarify.  If you requested that your care partner not be given the details of your procedure findings, then the procedure report has been included in a sealed envelope for you to review at your convenience later.  YOU SHOULD EXPECT: Some feelings of bloating in the abdomen. Passage of more gas than usual.  Walking can help get rid of the air that was put into your GI tract during the procedure and reduce the bloating. If you had a lower endoscopy (such as a colonoscopy or flexible sigmoidoscopy) you may notice spotting of blood in your stool or on the toilet paper. If you underwent a bowel prep for your procedure, you may not have a normal bowel movement for a few days.  Please Note:  You might notice some irritation and congestion in your nose or some drainage.  This is from the oxygen used during your procedure.  There is no need for concern and it should clear up in a day or so.  SYMPTOMS TO REPORT IMMEDIATELY:  Following upper endoscopy (EGD)  Vomiting of blood or coffee ground material  New chest pain or pain under the shoulder blades  Painful or persistently difficult swallowing  New shortness of breath  Fever of 100F or higher  Black, tarry-looking stools  For urgent or emergent issues, a gastroenterologist can be reached at any hour by calling [phone]. Do not use MyChart messaging for urgent concerns.    DIET:  We do recommend a small meal at first, but then you may proceed to your regular diet.  Drink plenty of fluids but you should avoid  alcoholic beverages for 24 hours.  ACTIVITY:  You should plan to take it easy for the rest of today and you should NOT DRIVE or use heavy machinery until tomorrow (because of the sedation medicines used during the test).    FOLLOW UP: Our staff will call the number listed on your records the next business day following your procedure.  We will call around 7:15- 8:00 am to check on you and address any questions or concerns that you may have regarding the information given to you following your procedure. If we do not reach you, we will leave a message.     If any biopsies were taken you will be contacted by phone or by letter within the next 1-3 weeks.  Please call us at [phone] if you have not heard about the biopsies in 3 weeks.    SIGNATURES/CONFIDENTIALITY: You and/or your care partner have signed paperwork which will be entered into your electronic medical record.  These signatures attest to the fact that that the information above on your After Visit Summary has been reviewed and is understood.  Full responsibility of the confidentiality of this discharge information lies with you and/or your care-partner.

## 2022-10-02 NOTE — Progress Notes (Signed)
See 09/20/2022 H&P, no changes

## 2022-10-02 NOTE — Progress Notes (Signed)
Patient coughing throughout most of upper endoscopy. O2 turned up, jaw thrust performed. Patient responsive with improved spo2. Otherwise, uneventful anesthetic. Report to pacu rn. Patient on O2 in pacu, to be weaned off by pacu rn. Vss. Care resumed by rn.

## 2022-10-02 NOTE — Progress Notes (Signed)
VS by CW  Pt's states no medical or surgical changes since previsit or office visit.  

## 2022-10-03 ENCOUNTER — Telehealth: Payer: Self-pay | Admitting: *Deleted

## 2022-10-03 NOTE — Telephone Encounter (Signed)
  Follow up Call-     10/02/2022    2:28 PM 10/01/2021    2:24 PM  Call back number  Post procedure Call Back phone  # [phone]  Permission to leave phone message Yes Yes     Patient questions:  Message left to call if necessary.

## 2022-10-16 ENCOUNTER — Telehealth: Payer: Self-pay | Admitting: Family Medicine

## 2022-10-16 NOTE — Telephone Encounter (Signed)
Contacted [name] to schedule their annual wellness visit. Appointment made for 10/25/2022.  Sherol Dade; Care Guide Ambulatory Clinical Support [facility] Direct Dial: [phone] h

## 2022-10-17 ENCOUNTER — Other Ambulatory Visit: Payer: Self-pay | Admitting: Family Medicine

## 2022-10-17 DIAGNOSIS — E785 Hyperlipidemia, unspecified: Secondary | ICD-10-CM

## 2022-10-17 DIAGNOSIS — E1159 Type 2 diabetes mellitus with other circulatory complications: Secondary | ICD-10-CM

## 2022-10-24 ENCOUNTER — Other Ambulatory Visit: Payer: Self-pay

## 2022-10-24 DIAGNOSIS — K2950 Unspecified chronic gastritis without bleeding: Secondary | ICD-10-CM

## 2022-10-24 MED ORDER — FAMOTIDINE 40 MG PO TABS: 40.0000 mg | ORAL_TABLET | Freq: Two times a day (BID) | ORAL | 6 refills | Status: AC

## 2022-10-25 ENCOUNTER — Ambulatory Visit (INDEPENDENT_AMBULATORY_CARE_PROVIDER_SITE_OTHER): Payer: Medicare Other

## 2022-10-25 VITALS — Ht 59.0 in | Wt 166.0 lb

## 2022-10-25 DIAGNOSIS — Z0000 Encounter for general adult medical examination without abnormal findings: Secondary | ICD-10-CM | POA: Diagnosis not present

## 2022-10-25 NOTE — Progress Notes (Signed)
Subjective:   Destiny Ballard is a 72 y.o. female who presents for Medicare Annual (Subsequent) preventive examination.  I connected with  [name] on 10/25/22 by a audio enabled telemedicine application and verified that I am speaking with the correct person using two identifiers.  Patient Location: Home  Provider Location: Home Office  I discussed the limitations of evaluation and management by telemedicine. The patient expressed understanding and agreed to proceed.  Review of Systems     Cardiac Risk Factors include: advanced age (>75men, >19 women)     Objective:    Today's Vitals   10/25/22 0857  Weight: 166 lb (75.3 kg)  Height: 4\' 11"  (1.499 m)   Body mass index is 33.53 kg/m.     10/25/2022    9:07 AM 11/27/2021   11:32 AM 11/23/2021    3:22 PM 11/21/2021   12:06 PM 06/14/2021    9:04 AM 11/18/2020    5:45 PM 08/30/2020   11:16 AM  Advanced Directives  Does Patient Have a Medical Advance Directive? Yes Yes Yes Yes Yes No Yes  Type of Estate agent of [place];Living will Living will Living will Living will Living will  Living will  Does patient want to make changes to medical advance directive? No - Patient declined No - Patient declined No - Patient declined No - Patient declined   No - Patient declined  Copy of Healthcare Power of Attorney in Chart? No - copy requested No - copy requested No - copy requested      Would patient like information on creating a medical advance directive?      No - Patient declined     Current Medications (verified) Outpatient Encounter Medications as of 10/25/2022  Medication Sig   albuterol (VENTOLIN HFA) 108 (90 Base) MCG/ACT inhaler Inhale 2 puffs into the lungs every 8 (eight) hours as needed for wheezing or shortness of breath.   aspirin EC 81 MG tablet Take 81 mg by mouth daily.   calcium carbonate (TUMS EX) 750 MG chewable tablet Chew 1 tablet by mouth daily as needed for heartburn.    Cholecalciferol (VITAMIN D3) 1000 UNITS CAPS Take 1,000 Units by mouth daily.   dapagliflozin propanediol (FARXIGA) 10 MG TABS tablet Take 1 tablet (10 mg total) by mouth daily.   Dulaglutide (TRULICITY) 4.5 MG/0.5ML SOPN INJECT 4.5 MG AS DIRECTED ONCE A WEEK.   famotidine (PEPCID) 40 MG tablet Take 1 tablet (40 mg total) by mouth 2 (two) times daily.   fexofenadine (ALLEGRA) 180 MG tablet Take 180 mg by mouth daily.   FLUoxetine (PROZAC) 40 MG capsule Take 1 capsule (40 mg total) by mouth daily.   furosemide (LASIX) 20 MG tablet Take 1 tablet (20 mg total) by mouth daily. For swelling / fluid retention   gabapentin (NEURONTIN) 300 MG capsule TAKE 2 CAPSULES BY MOUTH TWICE A DAY   hydrALAZINE (APRESOLINE) 25 MG tablet TAKE 2 TABLETS BY MOUTH 2 TIMES DAILY. FOR BLOOD PRESSURE (Patient taking differently: Take 25 mg by mouth in the morning and at bedtime.)   Insulin Glargine (BASAGLAR KWIKPEN) 100 UNIT/ML Inject 30 Units into the skin daily. Profile until patient requests   Insulin Pen Needle 32G X 4 MM MISC 1 Device by Does not apply route daily.   Lancets (FREESTYLE) lancets Use to check blood glucose once a day   metFORMIN (GLUCOPHAGE-XR) 500 MG 24 hr tablet TAKE 2 TABLETS BY MOUTH TWICE A DAY   metoprolol succinate (  TOPROL-XL) 100 MG 24 hr tablet TAKE 1 TABLET BY MOUTH EVERY DAY   montelukast (SINGULAIR) 10 MG tablet TAKE 1 TABLET (10 MG TOTAL) BY MOUTH AT BEDTIME. FOR ALLERGIES / ASTHMA   nitroGLYCERIN (NITROSTAT) 0.4 MG SL tablet Place under the tongue.   OVER THE COUNTER MEDICATION Take 1 each by mouth daily. CBD gummy   pantoprazole (PROTONIX) 40 MG tablet Take 1 tablet (40 mg total) by mouth 2 (two) times daily.   rosuvastatin (CRESTOR) 20 MG tablet TAKE 1 TABLET BY MOUTH DAILY. FOR CHOLESTEROL / HEART   Tiotropium Bromide Monohydrate (SPIRIVA RESPIMAT) 2.5 MCG/ACT AERS INHALE 2 PUFFS BY MOUTH INTO THE LUNGS DAILY   valsartan (DIOVAN) 160 MG tablet Take 1 tablet (160 mg total) by mouth  daily.   zinc gluconate 50 MG tablet Take 50 mg by mouth daily.   Continuous Blood Gluc Sensor (DEXCOM G7 SENSOR) MISC USE TO CHECK BLOOD GLUCOSE CONTINUOUSLY. CHANGE SENSOR EVERY 10 DAYS. (Patient not taking: Reported on 10/02/2022)   No facility-administered encounter medications on file as of 10/25/2022.    Allergies (verified) Prednisone   History: Past Medical History:  Diagnosis Date   Anxiety    Prior suicide attempt   CAD (coronary artery disease)    a) s/p DES to LAD 07/2005 b) Last Myoview low risk 11/2011 showing small fixed apical perfusion defect (prior MI vs attenuation) but no ischemia - normal EF.   Cervical spondylosis    Chest pain 12/10/2011   Chronic diastolic CHF (congestive heart failure) 09/18/2017   Chronic eczematous otitis externa of both ears 11/18/2019   Chronic rhinitis 08/07/2016   Complication of anesthesia    developed pneumonia after nerve block was hospitalized   Constipation 12/18/2020   COPD with acute exacerbation 04/25/2016   Coronary atherosclerosis 06/28/2008   Depression    Depression with anxiety    Diabetes mellitus 09/08/2017   Diabetic neuropathy    Dyspnea on exertion 09/02/2012   CXR 07/2012:  No acute process.     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 EF 60%   CARDIAC CATHETERIZATION N/A 01/29/2016   Procedure: Left Heart Cath and Coronary Angiography;  Surgeon: Tonny Bollman, MD;  Location:  [facility];  Service: Cardiovascular;  Laterality: N/A;   CERVICAL SPONDYLOSIS     SINGLE LEVEL FUSION   CHILDBIRTH     X3   COLONOSCOPY  2010   normal   COLONOSCOPY WITH PROPOFOL  10/01/2021   CORONARY STENT PLACEMENT  07/15/2005   LEFT ANTERIOR DESCENDING   FOREARM FRACTURE SURGERY  07/15/2008   hand and shoulder    INCISION AND DRAINAGE BREAST ABSCESS  01/05/2012       INCISION AND DRAINAGE PERIRECTAL ABSCESS N/A 02/18/2014   Procedure: IRRIGATION AND DEBRIDEMENT PERIRECTAL ABSCESS;  Surgeon: Valarie Merino, MD;  Location: [facility];  Service: General;  Laterality: N/A;   LEFT HEART CATH AND CORONARY ANGIOGRAPHY N/A 10/10/2017   Procedure: LEFT HEART CATH AND CORONARY ANGIOGRAPHY;  Surgeon: Kathleene Hazel, MD;  Location: [facility];  Service: Cardiovascular;  Laterality: N/A;   LUMBAR LAMINECTOMY     ORIF WRIST FRACTURE Right 11/27/2021   Procedure: OPEN REDUCTION INTERNAL FIXATION (ORIF) WRIST FRACTURE;  Surgeon: Teryl Lucy, MD;  Location: [facility];  Service: Orthopedics;  Laterality: Right;   ROTATOR CUFF REPAIR     bilaterla   TONSILLECTOMY AND ADENOIDECTOMY     TUBAL LIGATION     VIDEO BRONCHOSCOPY Bilateral 08/13/2016   Procedure: VIDEO BRONCHOSCOPY  WITHOUT FLUORO;  Surgeon: Leslye Peer, MD;  Location: [facility];  Service: Cardiopulmonary;  Laterality: Bilateral;   Family History  Problem Relation Age of Onset   Heart attack Mother    Diabetes Mother    Lung cancer Mother    Asthma Mother    Heart disease Mother    Suicidality Father        "killed himself"   Diabetes Sister    Cancer Sister    Colon cancer Sister 31   Asthma Daughter        x 2   Cancer Daughter        pre-cancerous polyp   Cervical cancer Daughter        cervical    Allergies Other        all family--seasonal allergies   Social History   Socioeconomic History   Marital status: Widowed    Spouse name: Gerri Spore   Number of children: 3   Years of education: 12   Highest education level: Not on file  Occupational History   Occupation: retired from [employer]    Comment: 11/2012  Tobacco Use   Smoking status: Never    Passive exposure: Yes   Smokeless tobacco: Never  Vaping Use   Vaping Use: Never used  Substance and Sexual Activity   Alcohol use: Yes    Comment: occ   Drug use: No   Sexual activity: Not Currently    Partners: Male    Birth control/protection: None  Other Topics Concern   Not on file  Social History Narrative   Their eldest daughter lives upstairs.   Social Determinants of Health   Financial Resource Strain: Low Risk  (10/25/2022)   Overall Financial Resource Strain (CARDIA)    Difficulty of Paying Living Expenses: Not hard at all  Food Insecurity: No Food Insecurity (10/25/2022)   Hunger Vital Sign    Worried About Running Out of Food in the Last Year: Never true    Ran Out of Food in the Last Year: Never true  Transportation Needs: No Transportation Needs (10/25/2022)   PRAPARE - Transportation    Lack of Transportation (Medical): No    Lack  of Transportation (Non-Medical): No  Physical Activity: Inactive (10/25/2022)   Exercise Vital Sign    Days of Exercise per Week: 0 days    Minutes of Exercise per Session: 0 min   Stress: No Stress Concern Present (10/25/2022)   [employer] of Occupational Health - Occupational Stress Questionnaire    Feeling of Stress : Not at all  Social Connections: Moderately Isolated (10/25/2022)   Social Connection and Isolation Panel [NHANES]    Frequency of Communication with Friends and Family: Never    Frequency of Social Gatherings with Friends and Family: Three times a week    Attends Religious Services: 1 to 4 times per year    Active Member of Clubs or Organizations: No    Attends Banker Meetings: Never    Marital Status: Widowed    Tobacco Counseling Counseling given: Not Answered   Clinical Intake:  Pre-visit preparation completed: Yes  Pain : No/denies pain     BMI - recorded: 33.53 Nutritional Risks: None Diabetes: No  How often do you need to have someone help you when you read instructions, pamphlets, or other written materials from your doctor or pharmacy?: 1 - Never  Diabetic? Yes  Interpreter Needed?: No      Activities of Daily Living    10/25/2022    9:09 AM 11/27/2021    8:00 PM  In your present state of health, do you have any difficulty performing the following activities:  Hearing? 0 1  Vision? 0 0  Difficulty concentrating or making decisions? 0 0  Walking or climbing stairs? 0 1  Comment  pain  Dressing or bathing? 0 1  Doing errands, shopping? 0 0  Preparing Food and eating ? N   Using the Toilet? N   In the past six months, have you accidently leaked urine? N   Do you have problems with loss of bowel control? N   Managing your Medications? N   Managing your Finances? N   Housekeeping or managing your Housekeeping? N     Patient Care Team: Copland, Gwenlyn Found, MD as PCP - General (Family Medicine) Olivia Mackie, MD as Consulting Physician (Obstetrics and Gynecology) Henrene Pastor, RPH-CPP (Pharmacist)  Indicate any recent Medical Services you may have received from other than [facility] providers in  the past year (date may be approximate).     Assessment:   This is a routine wellness examination for Ronette.  Hearing/Vision screen Hearing Screening - Comments:: Denies hearing difficulties-  per patient left ear not as good  Vision Screening - Comments::  Last eye exam 09/2022-Dr. Emily Filbert.   Rx for reading glasses only  Dietary issues and exercise activities discussed: Current Exercise Habits: The patient does not participate in regular exercise at present, Exercise limited by: None identified   Goals Addressed             This Visit's Progress    Monitor and Manage My Blood Sugar   On track    Timeframe:  Long-Range Goal Priority:  Medium Start Date:    08/30/20                        Expected End Date:    06/28/21                  Follow Up Date 05/01/21   Patient Goals/Self-Care Activities continue to check blood sugars per provider recommendation and take blood sugar log to your doctor visits.  continue to take medications as prescribed  notify provider for sustained elevations in blood sugar or blood sugars less than 70. attend provider visits as scheduled and contact your provider with questions or concerns continue to work with embedded clinic pharmacist, Henrene Pastor, Pharm D for medication questions or concerns.  Continue to work on eating a healthy diet: lean protein, fruits, vegetables, low salt, low in fats. Cut fat from your meat and eat whole grain breads and cereals. Avoid greasy, processed food or foods that are high in fat. Monitor portion sizes and minimize processed foods. Personal goal: "decrease the amount of potato chips I eat". Increase activity per provider recommendations       Depression Screen    10/25/2022    9:12 AM 09/18/2022    8:11 AM 05/20/2022    2:40 PM 11/12/2021    3:45 PM 06/14/2021    9:08 AM 02/26/2021    2:37 PM 02/09/2021    3:49 PM  PHQ 2/9 Scores  PHQ - 2 Score 0 0 6 1  PHQ- 9 Score 0  12     Fall Risk     10/25/2022    9:02 AM 09/18/2022    8:11 AM 05/20/2022    2:16 PM 02/27/2022    9:18 AM 06/14/2021    9:06 AM  Fall Risk   Falls in the past year? 1 0 0 1 0  Number falls in past yr: 0 0 0 0 0  Injury with Fall? 1 0 0 1 0  Comment patient fell and broke her right wrist      Risk for fall due to : History of fall(s) No Fall Risks  History of fall(s);Medication side effect   Follow up Falls evaluation completed Falls evaluation completed Falls evaluation completed Falls evaluation completed Falls prevention discussed    FALL RISK PREVENTION PERTAINING TO THE HOME:  Any stairs in or around the home? No  If so, are there any without handrails? No  Home free of loose throw rugs in walkways, pet beds, electrical cords, etc? No  Adequate lighting in your home to reduce risk of falls? Yes  ASSISTIVE DEVICES UTILIZED TO PREVENT FALLS:  Life alert? No  Use of a cane, walker or w/c? No  Grab bars in the bathroom? Yes  Shower chair or bench in shower? Yes  Elevated toilet seat or a handicapped toilet? No   TIMED UP AND GO:  Was the test performed? No . Televisit   Cognitive Function:    09/01/2017    3:13 PM  MMSE - Mini Mental State Exam  Orientation to time 5  Orientation to Place 5  Registration 3  Attention/ Calculation 5  Recall 3  Language- name 2 objects 2  Language- repeat 1  Language- follow 3 step command 3  Language- read & follow direction 1  Write a sentence 1  Copy design 1  Total score 30        10/25/2022    9:09 AM  6CIT Screen  What Year? 0 points  What month? 0 points  What time? 0 points  Count back from 20 0 points  Months in reverse 0 points  Repeat phrase 2 points  Total Score 2 points    Immunizations Immunization History  Administered Date(s) Administered   Fluad Quad(high Dose 65+) 09/04/2020, 05/20/2022   Influenza Whole 04/14/2010   Influenza, High Dose Seasonal PF 04/03/2017, 05/11/2018, 03/05/2019, 03/12/2021   Influenza,inj,Quad  PF,6+ Mos 05/10/2013, 04/05/2014, 05/22/2015, 04/25/2016   PFIZER Comirnaty(Gray Top)Covid-19 Tri-Sucrose Vaccine 01/11/2021, 02/01/2021   Pneumococcal Conjugate-13 07/24/2016   Pneumococcal Polysaccharide-23 04/05/2014, 05/22/2015   Tdap 07/11/2014, 03/05/2019   Zoster Recombinat (Shingrix) 03/05/2019, 06/05/2019      Flu Vaccine status: Up to date  Pneumococcal vaccine status: Up to date  Covid-19 vaccine status: Completed vaccines  Qualifies for Shingles Vaccine? Yes   Zostavax completed No   Shingrix Completed?: Yes  Screening Tests Health Maintenance  Topic Date Due   MAMMOGRAM  01/17/2021   COVID-19 Vaccine (3 - 2023-24 season) 03/15/2022   INFLUENZA VACCINE  02/13/2023   HEMOGLOBIN A1C  03/21/2023   Diabetic kidney evaluation - Urine ACR  05/21/2023   Diabetic kidney evaluation - eGFR measurement  09/18/2023   FOOT EXAM  09/18/2023   OPHTHALMOLOGY EXAM  09/19/2023   Medicare Annual Wellness (AWV)  10/25/2023   DTaP/Tdap/Td (3 - Td or Tdap) 03/04/2029   COLONOSCOPY (Pts 45-49yrs Insurance coverage will need to be confirmed)  10/02/2031   Pneumonia Vaccine 85+ Years old  Completed   DEXA SCAN  Completed   Hepatitis C Screening  Completed   Zoster Vaccines- Shingrix  Completed   HPV VACCINES  Aged Out    Health Maintenance  Health Maintenance Due  Topic Date Due   MAMMOGRAM  01/17/2021   COVID-19 Vaccine (3 - 2023-24 season) 03/15/2022    Colorectal cancer screening: Type of screening: Colonoscopy. Completed 10/01/2021. Repeat every 10 years  Mammogram status: No longer required due to -per patient not interested in mammogram at this time.  Bone Density status: Completed 06/21/2021. Results reflect: Bone density results: OSTEOPOROSIS. Repeat every 2 years.  Lung Cancer Screening: (Low Dose CT Chest recommended if Age 19-80 years, 30 pack-year currently smoking OR have quit w/in 15years.) does not qualify.   Lung Cancer Screening Referral: None  Additional  Screening:  Hepatitis C Screening: does qualify; Completed 09/20/2015  Vision Screening: Recommended annual ophthalmology exams for early detection of glaucoma and other disorders of the eye. Is the patient up to date with their annual eye exam?  Yes  Who is the provider or what is the name of the office in which the patient attends annual eye exams? Dr. Emily Filbert  If pt is not established with a provider, would they like to be referred to a provider to establish care? No .   Dental Screening: Recommended annual dental exams for proper oral hygiene  Community Resource Referral / Chronic Care Management: CRR required this visit?  No   CCM required this visit?  No      Plan:     I have personally reviewed and noted the following in the patient's chart:   Medical and social history Use of alcohol, tobacco or illicit drugs  Current medications and supplements including opioid prescriptions. Patient is not currently taking opioid prescriptions. Functional ability and status Nutritional status Physical activity Advanced directives List of other physicians Hospitalizations, surgeries, and ER visits in previous 12 months Vitals Screenings to include cognitive, depression, and falls Referrals and appointments  In addition, I have reviewed and discussed with patient certain preventive protocols, quality metrics, and best practice recommendations. A written personalized care plan for preventive services as well as general preventive health recommendations were provided to patient.     Milus Mallick, CMA   10/25/2022   Nurse Notes:   Discuss mammogram completion,  also updated  vaccines

## 2022-10-25 NOTE — Patient Instructions (Signed)
[name] , Thank you for taking time to come for your Medicare Wellness Visit. I appreciate your ongoing commitment to your health goals. Please review the following plan we discussed and let me know if I can assist you in the future.   These are the goals we discussed:  Goals      Chronic Care Management Pharmacy Care Plan     Current Barriers:  Patient states Stiolto copay is high especially with other medications she takes that have brand / high copays Unable to achieve control of hypertension (high blood pressure) and diabetes  Reports high cost of Farxiga and Trulicity  Chronic Obstructive Pulmonary Disease: Pharmacist Clinical Goal(s):  Over the next 90 days, patient will continue to adhere to current inhaler regimen and identify potential cost savings through collaboration with PharmD and provider.  Current treatment:  Spiriva 2.5mg  - 1 inhalation once a day;  Albuterol inhaler as needed for shortness of breath and wheezing;  montelukast  daily at bedtime;  Interventions:  Recommended continue current regimen.  Take medications as prescribed Collaborate with provider on medication access solutions  Hypertension / High Blood Pressure: Pharmacist Clinical Goal(s):  Over the next 90 days, patient will achieve adherence to monitoring guidelines and medication adherence to achieve therapeutic efficacy. Also will achieve control of blood pressure as evidenced by home BP readings < 140/90 through collaboration with PharmD and provider.  BP Readings from Last 3 Encounters:  02/13/22 120/60  11/29/21 (!) 111/50  11/21/21 (!) 142/74  Current treatment:  hydralazine  - 2 tablets =  twice a day (dose lowered from  twice a day to  twice a day during hospitalization 11/2021 but patient restarted previous dose when she returned home) furosemide  daily Valsartan  daily (dose was lowered to  or 0.5 tablet daily during hospitalization 11/2021 but patient  restarted previous dose when she returned home) metoprolol succinate  daily. Farxiga  daily  Interventions: Educated on BP goals and importance of obtaining BP goals to decrease risk of stroke and renal disease Reviewed refill history with pharmacy.  Lower dose of hydralazine to  - take 1 tablet daily to see if dizziness improves.  Continue to take valsartan  daily  Check blood pressure 2 to 3 times per week, document, and provide at future appointments  Diabetes: Pharmacist Clinical Goal(s):  Over the next 90 days, patient will achieve adherence to monitoring guidelines and medication adherence to achieve therapeutic efficacy Achieve control of diabetes as evidenced by meeting goal of A1c <7.0% through collaboration with PharmD and provider.  Lab Results  Component Value Date   HGBA1C 7.2 (H) 02/13/2022  Current treatment: Farxiga  daily Basaglar 30 units daily Trulicity  weekly metformin ER  - take 2 tablets twice a day Interventions: Educated on importance of taking all medication for diabetes as prescribed.  Discussed taking above medication regimen as prescribed to reach A1c goal Reviewed signs and symptoms of hypoglycemia and how to treat.  Check blood glucose 2 times per day; document, and provide at future appointments Collaborate with clinical pharmacist and provider on medication access solutions. Provided discount coupons for Farxiga, Trulicity and Hospital doctor (max copay $25)   Depression with Anxiety:  Pharmacist Clinical Goal(s):  Over the next 90 days, patient will continue to adhere to medication regimen for depression and anxiety while minimizing potential side effects Current treatment:  trazodone  - take 0.5 tablet =  at bedtime as needed fluoxetine  daily Interventions:  Recommend continue current regimen  for depression and anxiety.  Hyperlipidemia / CAD:  Pharmacist Clinical Goal(s):  Over the next 90 days, patient  will maintain LDL of <70  Lab Results  Component Value Date   LDLCALC 45 01/01/2021  Current treatment:  Rosuvastatin 20mg  daily  Aspirin 81mg  daily Interventions:  Discussed following diet low in sugar and saturated fat.  Discussed improtance of taking statin in prevention of cardiovascular disease Continue current medications as prescribed for heart / cholesterol   Low Bone Mass:  Recent fracture L2 and wrist after a fall while playing with her grandson.  Dexa results form 12/08/203: AP Spine T-Score = -1.1 Hip T-Score = -2.0 FRAX estimate = 18.4% of major osteoporotic fracture and 3.8% hip fracture.  Interventions Discussed DEXA results. Consulting with Dr Patsy Lager regarding treatment recommendations to prevent future fractures.  Recommended calcium 1200mg  daily Recommended vitamin D 1000 units daily.   Medication Management: Pharmacist Clinical Goal(s): Over the next 90 days, patient will take medications as prescribed Current pharmacy: [facility] [place] Interventions:  Assisted with requested needed refills from pharmacy - albuterol inhaler and furosemide Patient requested easy open top for prescription due to recent wrist surgery. Coordinated with [facility]   Patient self care activities - Over the next 90 days patient will: Take Medications as recommended Make sure to take Basaglar insulin every day (only hold if blood glucose is < 80) Lower dose of hydralazine to take 25mg  twice a day to see if dizzy episode improves Contact provider's office or clinical pharmacist if you have an problems of questions about medications or medication costs.  Continue to check blood glucose daily:  Home blood glucose goals  Fasting blood glucose goal (before meals) = 80 to 130 Blood glucose goal after a meal = less than 180       Monitor and Manage My Blood Sugar     Timeframe:  Long-Range Goal Priority:  Medium Start Date:    08/30/20                        Expected End Date:    06/28/21                   Follow Up Date 05/01/21   Patient Goals/Self-Care Activities continue to check blood sugars per provider recommendation and take blood sugar log to your doctor visits.  continue to take medications as prescribed  notify provider for sustained elevations in blood sugar or blood sugars less than 70. attend provider visits as scheduled and contact your provider with questions or concerns continue to work with embedded clinic pharmacist, Henrene Pastor, Pharm D for medication questions or concerns.  Continue to work on eating a healthy diet: lean protein, fruits, vegetables, low salt, low in fats. Cut fat from your meat and eat whole grain breads and cereals. Avoid greasy, processed food or foods that are high in fat. Monitor portion sizes and minimize processed foods. Personal goal: "decrease the amount of potato chips I eat". Increase activity per provider recommendations     Patient Stated     Increase activity as tolerated     Track and Manage my Respiratory Symptoms     Timeframe:  Long-Range Goal Priority:  Medium Start Date:   08/30/20                        Expected End Date: 06/28/21  Follow Up Date 05/01/21   Patient Goals/Self-Care Activities:  keep all follow-up appointments.  Continue to follow the COPD action plan as needed. Continue to take your medications as prescribed. Call your provider with and worsening signs/symptoms or concerns.  Continue to work with care management team for ongoing care coordination and health care needs.         This is a list of the screening recommended for you and due dates:  Health Maintenance  Topic Date Due   Mammogram  01/17/2021   COVID-19 Vaccine (3 - 2023-24 season) 03/15/2022   Flu Shot  02/13/2023   Hemoglobin A1C  03/21/2023   Yearly kidney health urinalysis for diabetes  05/21/2023   Yearly kidney function blood test for diabetes  09/18/2023   Complete foot exam   09/18/2023   Eye exam for  diabetics  09/19/2023   Medicare Annual Wellness Visit  10/25/2023   DTaP/Tdap/Td vaccine (3 - Td or Tdap) 03/04/2029   Colon Cancer Screening  10/02/2031   Pneumonia Vaccine  Completed   DEXA scan (bone density measurement)  Completed   Hepatitis C Screening: USPSTF Recommendation to screen - Ages 4-79 yo.  Completed   Zoster (Shingles) Vaccine  Completed   HPV Vaccine  Aged Out    Advanced directives:  Will bring copy to office   Conditions/risks identified: Keep up the good work  Next appointment: Follow up in one year for your annual wellness visit    Preventive Care 65 Years and Older, Female Preventive care refers to lifestyle choices and visits with your health care provider that can promote health and wellness. What does preventive care include? A yearly physical exam. This is also called an annual well check. Dental exams once or twice a year. Routine eye exams. Ask your health care provider how often you should have your eyes checked. Personal lifestyle choices, including: Daily care of your teeth and gums. Regular physical activity. Eating a healthy diet. Avoiding tobacco and drug use. Limiting alcohol use. Practicing safe sex. Taking low-dose aspirin every day. Taking vitamin and mineral supplements as recommended by your health care provider. What happens during an annual well check? The services and screenings done by your health care provider during your annual well check will depend on your age, overall health, lifestyle risk factors, and family history of disease. Counseling  Your health care provider may ask you questions about your: Alcohol use. Tobacco use. Drug use. Emotional well-being. Home and relationship well-being. Sexual activity. Eating habits. History of falls. Memory and ability to understand (cognition). Work and work Astronomer. Reproductive health. Screening  You may have the following tests or measurements: Height, weight, and  BMI. Blood pressure. Lipid and cholesterol levels. These may be checked every 5 years, or more frequently if you are over 60 years old. Skin check. Lung cancer screening. You may have this screening every year starting at age 40 if you have a 30-pack-year history of smoking and currently smoke or have quit within the past 15 years. Fecal occult blood test (FOBT) of the stool. You may have this test every year starting at age 29. Flexible sigmoidoscopy or colonoscopy. You may have a sigmoidoscopy every 5 years or a colonoscopy every 10 years starting at age 12. Hepatitis C blood test. Hepatitis B blood test. Sexually transmitted disease (STD) testing. Diabetes screening. This is done by checking your blood sugar (glucose) after you have not eaten for a while (fasting). You may have this done every 1-3 years.  Bone density scan. This is done to screen for osteoporosis. You may have this done starting at age 55. Mammogram. This may be done every 1-2 years. Talk to your health care provider about how often you should have regular mammograms. Talk with your health care provider about your test results, treatment options, and if necessary, the need for more tests. Vaccines  Your health care provider may recommend certain vaccines, such as: Influenza vaccine. This is recommended every year. Tetanus, diphtheria, and acellular pertussis (Tdap, Td) vaccine. You may need a Td booster every 10 years. Zoster vaccine. You may need this after age 57. Pneumococcal 13-valent conjugate (PCV13) vaccine. One dose is recommended after age 93. Pneumococcal polysaccharide (PPSV23) vaccine. One dose is recommended after age 67. Talk to your health care provider about which screenings and vaccines you need and how often you need them. This information is not intended to replace advice given to you by your health care provider. Make sure you discuss any questions you have with your health care provider. Document  Released: 07/28/2015 Document Revised: 03/20/2016 Document Reviewed: 05/02/2015 Elsevier Interactive Patient Education  2017 [employer].  Fall Prevention in the Home Falls can cause injuries. They can happen to people of all ages. There are many things you can do to make your home safe and to help prevent falls. What can I do on the outside of my home? Regularly fix the edges of walkways and driveways and fix any cracks. Remove anything that might make you trip as you walk through a door, such as a raised step or threshold. Trim any bushes or trees on the path to your home. Use bright outdoor lighting. Clear any walking paths of anything that might make someone trip, such as rocks or tools. Regularly check to see if handrails are loose or broken. Make sure that both sides of any steps have handrails. Any raised decks and porches should have guardrails on the edges. Have any leaves, snow, or ice cleared regularly. Use sand or salt on walking paths during winter. Clean up any spills in your garage right away. This includes oil or grease spills. What can I do in the bathroom? Use night lights. Install grab bars by the toilet and in the tub and shower. Do not use towel bars as grab bars. Use non-skid mats or decals in the tub or shower. If you need to sit down in the shower, use a plastic, non-slip stool. Keep the floor dry. Clean up any water that spills on the floor as soon as it happens. Remove soap buildup in the tub or shower regularly. Attach bath mats securely with double-sided non-slip rug tape. Do not have throw rugs and other things on the floor that can make you trip. What can I do in the bedroom? Use night lights. Make sure that you have a light by your bed that is easy to reach. Do not use any sheets or blankets that are too big for your bed. They should not hang down onto the floor. Have a firm chair that has side arms. You can use this for support while you get dressed. Do  not have throw rugs and other things on the floor that can make you trip. What can I do in the kitchen? Clean up any spills right away. Avoid walking on wet floors. Keep items that you use a lot in easy-to-reach places. If you need to reach something above you, use a strong step stool that has a grab bar.  Keep electrical cords out of the way. Do not use floor polish or wax that makes floors slippery. If you must use wax, use non-skid floor wax. Do not have throw rugs and other things on the floor that can make you trip. What can I do with my stairs? Do not leave any items on the stairs. Make sure that there are handrails on both sides of the stairs and use them. Fix handrails that are broken or loose. Make sure that handrails are as long as the stairways. Check any carpeting to make sure that it is firmly attached to the stairs. Fix any carpet that is loose or worn. Avoid having throw rugs at the top or bottom of the stairs. If you do have throw rugs, attach them to the floor with carpet tape. Make sure that you have a light switch at the top of the stairs and the bottom of the stairs. If you do not have them, ask someone to add them for you. What else can I do to help prevent falls? Wear shoes that: Do not have high heels. Have rubber bottoms. Are comfortable and fit you well. Are closed at the toe. Do not wear sandals. If you use a stepladder: Make sure that it is fully opened. Do not climb a closed stepladder. Make sure that both sides of the stepladder are locked into place. Ask someone to hold it for you, if possible. Clearly mark and make sure that you can see: Any grab bars or handrails. First and last steps. Where the edge of each step is. Use tools that help you move around (mobility aids) if they are needed. These include: Canes. Walkers. Scooters. Crutches. Turn on the lights when you go into a dark area. Replace any light bulbs as soon as they burn out. Set up your  furniture so you have a clear path. Avoid moving your furniture around. If any of your floors are uneven, fix them. If there are any pets around you, be aware of where they are. Review your medicines with your doctor. Some medicines can make you feel dizzy. This can increase your chance of falling. Ask your doctor what other things that you can do to help prevent falls. This information is not intended to replace advice given to you by your health care provider. Make sure you discuss any questions you have with your health care provider. Document Released: 04/27/2009 Document Revised: 12/07/2015 Document Reviewed: 08/05/2014 Elsevier Interactive Patient Education  2017 [employer].

## 2022-10-29 DIAGNOSIS — L308 Other specified dermatitis: Secondary | ICD-10-CM | POA: Diagnosis not present

## 2022-10-29 DIAGNOSIS — L821 Other seborrheic keratosis: Secondary | ICD-10-CM | POA: Diagnosis not present

## 2022-10-29 NOTE — Patient Instructions (Incomplete)
It was good to see you again today- I am sorry allergies are bothering you  We gave you a shot of steroids today I would also suggest adding a nasal steroid spray such as flonase or nasacort to your regimen Please le me know if you are not feeling better in the next few days- Sooner if worse.   Keep a closer eye on your blood sugar as well after using the steroid shot

## 2022-10-29 NOTE — Progress Notes (Signed)
[facility] [address], [address] [place], [place] [address] [phone]  Date:  10/30/2022   Name:  Destiny Ballard   DOB:  [birth year]   MRN:  [id number]  PCP:  Pearline Cables, MD    Chief Complaint: URI (Pt c/o allergy- like sxs x 1 week. Sxs: cough, sneezing, congestion pt has tried Nyquil no relief. No covid test)   History of Present Illness:  Destiny Ballard is a 72 y.o. very pleasant female patient who presents with the following:  Patient seen today with concern of sinus discomfort, possible allergies Most recent visit with myself was last month for diabetes recheck- history of CAD (stents 2007, most recent cath 2019 negative) with cardiology care, hypertension, CHF, sleep apnea, COPD, diabetes, GERD, hyperlipidemia   Diabetes under good control most recent visit, A1c 7.2  She has noted scratchy throat, sneezing and coughing and left earache She has noted sx for about a week now  She is using her allegra and singulair which she uses year round  She also has albuterol to use as needed Noted borderline O2 sat- pt states she is not feeling SOB at all   She did get allergy testing in the past and notes "I was allergic to everything" -both environmental and domestic animals No fever or chills   Patient notes oral steroids/prednisone tend to cause mood swings and bad dreams, but that she is able to tolerate the steroid injection with no problem  Lab Results  Component Value Date   HGBA1C 7.2 (H) 09/18/2022     Patient Active Problem List   Diagnosis Date Noted   Diabetes mellitus 03/01/2022   Hypoxia 11/27/2021   Osteopenia 06/27/2021   Melena 12/18/2020   History of colonic polyps 12/18/2020   Constipation 12/18/2020   Obesity    Iron deficiency anemia    Insulin resistance    Hyperlipidemia    Cervical spondylosis    Anxiety    Pneumonia due to COVID-19 virus 08/21/2020   Hyperlipidemia associated  with type 2 diabetes mellitus 08/21/2020   Type 2 diabetes mellitus with diabetic polyneuropathy, with long-term current use of insulin 12/10/2019   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   Tinnitus of both ears 11/18/2019   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   Eustachian tube dysfunction, bilateral 11/18/2019   Chronic eczematous otitis externa of both ears 11/18/2019   Obesity (BMI 30-39.9) 08/24/2018   Depression with anxiety 08/24/2018   CAD (coronary artery disease) 08/24/2018   Abnormal cardiovascular stress test 08/24/2018   Precordial chest pain    Sepsis 09/18/2017   Chronic diastolic CHF (congestive heart failure) 09/18/2017   Chronic rhinitis 08/07/2016   COPD with acute exacerbation 04/25/2016   Hypertension associated with diabetes 01/30/2016   OSA (obstructive sleep apnea) 05/06/2014   Dyspnea on exertion 09/02/2012   GERD 06/28/2008    Past Medical History:  Diagnosis Date   Anxiety    Prior suicide attempt   CAD (coronary artery disease)    a) s/p DES to LAD 07/2005 b) Last Myoview low risk 11/2011 showing small fixed apical perfusion defect (prior MI vs attenuation) but no ischemia - normal EF.   Cervical spondylosis    Chest pain 12/10/2011   Chronic diastolic CHF (congestive heart failure) 09/18/2017   Chronic eczematous otitis externa of both ears 11/18/2019   Chronic rhinitis 08/07/2016   Complication of anesthesia  developed pneumonia after nerve block was hospitalized   Constipation 12/18/2020   COPD with acute exacerbation 04/25/2016   Coronary atherosclerosis 06/28/2008   Depression    Depression with anxiety    Diabetes mellitus 09/08/2017   Diabetic neuropathy    Dyspnea on exertion 09/02/2012   CXR 07/2012:  No acute process.     Eustachian tube dysfunction, bilateral 11/18/2019   GERD 06/28/2008   Hearing loss    Left   History of colonic polyps 12/18/2020   History of kidney stones    HLD  (hyperlipidemia)    Hyperlipidemia associated with type 2 diabetes mellitus 08/21/2020   Hypertension    Hypertension associated with diabetes 01/30/2016   Hypokalemia 01/27/2016   Insulin resistance    Iron deficiency anemia    Leukocytosis    history of mild leukocytosis   Lobar pneumonia, unspecified organism 11/13/2017   Melena 12/18/2020   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   Obesity (BMI 30-39.9) 08/24/2018   Last Assessment & Plan:  Exercise and weight reduction is encouraged.   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 EF 60%   CARDIAC CATHETERIZATION N/A 01/29/2016   Procedure: Left Heart Cath and Coronary Angiography;  Surgeon: Tonny Bollman, MD;  Location: [facility] INVASIVE CV LAB;  Service: Cardiovascular;  Laterality: N/A;   CERVICAL SPONDYLOSIS     SINGLE LEVEL FUSION   CHILDBIRTH     X3   COLONOSCOPY  2010   normal   COLONOSCOPY WITH PROPOFOL  10/01/2021   CORONARY STENT PLACEMENT  07/15/2005   LEFT ANTERIOR DESCENDING   FOREARM FRACTURE SURGERY  07/15/2008   hand and shoulder    INCISION AND DRAINAGE BREAST ABSCESS  01/05/2012       INCISION AND DRAINAGE PERIRECTAL ABSCESS N/A 02/18/2014   Procedure: IRRIGATION AND DEBRIDEMENT PERIRECTAL ABSCESS;  Surgeon: Valarie Merino, MD;  Location: [facility];  Service: General;  Laterality: N/A;   LEFT HEART CATH AND CORONARY ANGIOGRAPHY N/A  10/10/2017   Procedure: LEFT HEART CATH AND CORONARY ANGIOGRAPHY;  Surgeon: Kathleene Hazel, MD;  Location: [facility] INVASIVE CV LAB;  Service: Cardiovascular;  Laterality: N/A;   LUMBAR LAMINECTOMY     ORIF WRIST FRACTURE Right 11/27/2021   Procedure: OPEN REDUCTION INTERNAL FIXATION (ORIF) WRIST FRACTURE;  Surgeon: Teryl Lucy, MD;  Location: [facility];  Service: Orthopedics;  Laterality: Right;   ROTATOR CUFF REPAIR     bilaterla   TONSILLECTOMY AND ADENOIDECTOMY     TUBAL LIGATION     VIDEO BRONCHOSCOPY Bilateral 08/13/2016   Procedure: VIDEO BRONCHOSCOPY WITHOUT FLUORO;  Surgeon: Leslye Peer, MD;  Location: [facility];  Service: Cardiopulmonary;  Laterality: Bilateral;    Social History   Tobacco Use   Smoking status: Never    Passive exposure: Yes   Smokeless tobacco: Never  Vaping Use   Vaping Use: Never used  Substance Use Topics   Alcohol use:  Yes    Comment: occ   Drug use: No    Family History  Problem Relation Age of Onset   Heart attack Mother    Diabetes Mother    Lung cancer Mother    Asthma Mother    Heart disease Mother    Suicidality Father        "killed himself"   Diabetes Sister    Cancer Sister    Colon cancer Sister 7   Asthma Daughter        x 2   Cancer Daughter        pre-cancerous polyp   Cervical cancer Daughter        cervical    Allergies Other        all family--seasonal allergies    Allergies  Allergen Reactions   Prednisone Other (See Comments)    REACTION: mood swings, nightmares. "Shot doesn't bother me, reaction is just with the pill" she states she has had the steroid injections before. From our records methylprednisone was given to her in 2013 without any complications.    Medication list has been reviewed and updated.  Current Outpatient Medications on File Prior to Visit  Medication Sig Dispense Refill   albuterol (VENTOLIN HFA) 108 (90 Base) MCG/ACT inhaler Inhale 2 puffs into the lungs every 8 (eight) hours as  needed for wheezing or shortness of breath. 18 g 5   aspirin EC 81 MG tablet Take 81 mg by mouth daily.     calcium carbonate (TUMS EX) 750 MG chewable tablet Chew 1 tablet by mouth daily as needed for heartburn.     Cholecalciferol (VITAMIN D3) 1000 UNITS CAPS Take 1,000 Units by mouth daily.     Continuous Blood Gluc Sensor (DEXCOM G7 SENSOR) MISC USE TO CHECK BLOOD GLUCOSE CONTINUOUSLY. CHANGE SENSOR EVERY 10 DAYS. 9 each 1   dapagliflozin propanediol (FARXIGA) 10 MG TABS tablet Take 1 tablet (10 mg total) by mouth daily. 90 tablet 3   Dulaglutide (TRULICITY) 4.5 MG/0.5ML SOPN INJECT 4.5 MG AS DIRECTED ONCE A WEEK. 4.5 mL 1   famotidine (PEPCID) 40 MG tablet Take 1 tablet (40 mg total) by mouth 2 (two) times daily. 60 tablet 6   fexofenadine (ALLEGRA) 180 MG tablet Take 180 mg by mouth daily.     FLUoxetine (PROZAC) 40 MG capsule Take 1 capsule (40 mg total) by mouth daily. 90 capsule 1   furosemide (LASIX) 20 MG tablet Take 1 tablet (20 mg total) by mouth daily. For swelling / fluid retention 90 tablet 1   gabapentin (NEURONTIN) 300 MG capsule TAKE 2 CAPSULES BY MOUTH TWICE A DAY 360 capsule 1   hydrALAZINE (APRESOLINE) 25 MG tablet TAKE 2 TABLETS BY MOUTH 2 TIMES DAILY. FOR BLOOD PRESSURE (Patient taking differently: Take 25 mg by mouth in the morning and at bedtime.) 360 tablet 1   Insulin Glargine (BASAGLAR KWIKPEN) 100 UNIT/ML Inject 30 Units into the skin daily. Profile until patient requests 15 mL 1   Insulin Pen Needle 32G X 4 MM MISC 1 Device by Does not apply route daily. 100 each 3   Lancets (FREESTYLE) lancets Use to check blood glucose once a day 100 each 5   metFORMIN (GLUCOPHAGE-XR) 500 MG 24 hr tablet TAKE 2 TABLETS BY MOUTH TWICE A DAY 360 tablet 3   metoprolol succinate (TOPROL-XL) 100 MG 24 hr tablet TAKE 1 TABLET BY MOUTH EVERY DAY 90 tablet 1   montelukast (SINGULAIR) 10 MG tablet TAKE 1 TABLET (10  MG TOTAL) BY MOUTH AT BEDTIME. FOR ALLERGIES / ASTHMA 90 tablet 1    nitroGLYCERIN (NITROSTAT) 0.4 MG SL tablet Place under the tongue.     OVER THE COUNTER MEDICATION Take 1 each by mouth daily. CBD gummy     pantoprazole (PROTONIX) 40 MG tablet Take 1 tablet (40 mg total) by mouth 2 (two) times daily. 180 tablet 1   rosuvastatin (CRESTOR) 20 MG tablet TAKE 1 TABLET BY MOUTH DAILY. FOR CHOLESTEROL / HEART 90 tablet 3   Tiotropium Bromide Monohydrate (SPIRIVA RESPIMAT) 2.5 MCG/ACT AERS INHALE 2 PUFFS BY MOUTH INTO THE LUNGS DAILY 4 g 5   valsartan (DIOVAN) 160 MG tablet Take 1 tablet (160 mg total) by mouth daily. 90 tablet 1   zinc gluconate 50 MG tablet Take 50 mg by mouth daily.     No current facility-administered medications on file prior to visit.    Review of Systems:  As per HPI- otherwise negative.   Physical Examination: Vitals:   10/30/22 1523  BP: 134/80  Pulse: 78  Resp: 18  Temp: 97.8 F (36.6 C)  SpO2: 90%   Vitals:   10/30/22 1523  Weight: 168 lb 12.8 oz (76.6 kg)  Height:  (1.499 m)   Body mass index is 34.09 kg/m. Ideal Body Weight: Weight in (lb) to have BMI = 25: 123.5  GEN: no acute distress. Mild obesity, looks wel  HEENT: Atraumatic, Normocephalic.  Bilateral TM wnl, oropharynx normal.  PEERL,EOMI. thin mucus is present in both sides of nasal cavity Ears and Nose: No external deformity. CV: RRR, No M/G/R. No JVD. No thrill. No extra heart sounds. PULM: CTA B, no wheezes, crackles, rhonchi. No retractions. No resp. distress. No accessory muscle use. ABD: S, NT, ND EXTR: No c/c/e PSYCH: Normally interactive. Conversant.    Assessment and Plan: Seasonal allergic rhinitis due to pollen - Plan: methylPREDNISolone acetate (DEPO-MEDROL) injection 40 mg  Patient seen today with exacerbation of seasonal allergies.  She is already taking Allegra and Singulair.  I encouraged her to add a nasal steroid spray, gave her a shot of Depo-Medrol today for more immediate relief.  She will keep me posted about her progress.   Advised that the steroid injection may cause her blood sugars to go up, she will monitor more carefully  Signed Abbe Amsterdam, MD

## 2022-10-30 ENCOUNTER — Ambulatory Visit (INDEPENDENT_AMBULATORY_CARE_PROVIDER_SITE_OTHER): Payer: Medicare Other | Admitting: Family Medicine

## 2022-10-30 VITALS — BP 134/80 | HR 78 | Temp 97.8°F | Resp 18 | Ht 59.0 in | Wt 168.8 lb

## 2022-10-30 DIAGNOSIS — J301 Allergic rhinitis due to pollen: Secondary | ICD-10-CM

## 2022-10-30 MED ORDER — METHYLPREDNISOLONE ACETATE 40 MG/ML IJ SUSP
40.0000 mg | Freq: Once | INTRAMUSCULAR | Status: AC
Start: 2022-10-30 — End: 2022-10-30
  Administered 2022-10-30: 40 mg via INTRAMUSCULAR

## 2022-11-02 ENCOUNTER — Other Ambulatory Visit: Payer: Self-pay | Admitting: Family Medicine

## 2022-11-02 DIAGNOSIS — R1013 Epigastric pain: Secondary | ICD-10-CM

## 2022-11-10 ENCOUNTER — Other Ambulatory Visit: Payer: Self-pay

## 2022-11-10 ENCOUNTER — Emergency Department (HOSPITAL_BASED_OUTPATIENT_CLINIC_OR_DEPARTMENT_OTHER): Payer: Medicare Other

## 2022-11-10 ENCOUNTER — Emergency Department (HOSPITAL_BASED_OUTPATIENT_CLINIC_OR_DEPARTMENT_OTHER)
Admission: EM | Admit: 2022-11-10 | Discharge: 2022-11-10 | Disposition: A | Discharge status: Home / Self Care | Payer: Medicare Other | Attending: Emergency Medicine | Admitting: Emergency Medicine

## 2022-11-10 ENCOUNTER — Encounter (HOSPITAL_BASED_OUTPATIENT_CLINIC_OR_DEPARTMENT_OTHER): Payer: Self-pay | Admitting: Emergency Medicine

## 2022-11-10 DIAGNOSIS — I2510 Atherosclerotic heart disease of native coronary artery without angina pectoris: Secondary | ICD-10-CM | POA: Diagnosis not present

## 2022-11-10 DIAGNOSIS — I110 Hypertensive heart disease with heart failure: Secondary | ICD-10-CM | POA: Insufficient documentation

## 2022-11-10 DIAGNOSIS — R0789 Other chest pain: Secondary | ICD-10-CM | POA: Diagnosis not present

## 2022-11-10 DIAGNOSIS — I5032 Chronic diastolic (congestive) heart failure: Secondary | ICD-10-CM | POA: Diagnosis not present

## 2022-11-10 DIAGNOSIS — Z794 Long term (current) use of insulin: Secondary | ICD-10-CM | POA: Insufficient documentation

## 2022-11-10 DIAGNOSIS — Z7982 Long term (current) use of aspirin: Secondary | ICD-10-CM | POA: Insufficient documentation

## 2022-11-10 DIAGNOSIS — E1140 Type 2 diabetes mellitus with diabetic neuropathy, unspecified: Secondary | ICD-10-CM | POA: Insufficient documentation

## 2022-11-10 DIAGNOSIS — Z79899 Other long term (current) drug therapy: Secondary | ICD-10-CM | POA: Diagnosis not present

## 2022-11-10 DIAGNOSIS — Z7984 Long term (current) use of oral hypoglycemic drugs: Secondary | ICD-10-CM | POA: Diagnosis not present

## 2022-11-10 DIAGNOSIS — R079 Chest pain, unspecified: Secondary | ICD-10-CM | POA: Diagnosis not present

## 2022-11-10 DIAGNOSIS — J441 Chronic obstructive pulmonary disease with (acute) exacerbation: Secondary | ICD-10-CM | POA: Insufficient documentation

## 2022-11-10 DIAGNOSIS — R0602 Shortness of breath: Secondary | ICD-10-CM | POA: Diagnosis not present

## 2022-11-10 LAB — BASIC METABOLIC PANEL
Anion gap: 11 (ref 5–15)
BUN: 15 mg/dL (ref 8–23)
CO2: 31 mmol/L (ref 22–32)
Calcium: 9.3 mg/dL (ref 8.9–10.3)
Chloride: 97 mmol/L — ABNORMAL LOW (ref 98–111)
Creatinine, Ser: 0.6 mg/dL (ref 0.44–1.00)
GFR, Estimated: >60 mL/min (ref >60)
Glucose, Bld: 107 mg/dL — ABNORMAL HIGH (ref 70–99)
Potassium: 3.8 mmol/L (ref 3.5–5.1)
Sodium: 139 mmol/L (ref 135–145)

## 2022-11-10 LAB — CBC
HCT: 39.7 % (ref 36.0–46.0)
Hemoglobin: 12.4 g/dL (ref 12.0–15.0)
MCH: 25.5 pg — ABNORMAL LOW (ref 26.0–34.0)
MCHC: 31.2 g/dL (ref 30.0–36.0)
MCV: 81.5 fL (ref 80.0–100.0)
Platelets: 260 10*3/uL (ref 150–400)
RBC: 4.87 MIL/uL (ref 3.87–5.11)
RDW: 14.1 % (ref 11.5–15.5)
WBC: 11.1 10*3/uL — ABNORMAL HIGH (ref 4.0–10.5)
nRBC: 0 % (ref 0.0–0.2)

## 2022-11-10 LAB — POCT I-STAT EG7
Acid-Base Excess: 7 mmol/L — ABNORMAL HIGH (ref 0.0–2.0)
Bicarbonate: 33.4 mmol/L — ABNORMAL HIGH (ref 20.0–28.0)
Calcium, Ion: 1.21 mmol/L (ref 1.15–1.40)
HCT: 40 % (ref 36.0–46.0)
Hemoglobin: 13.6 g/dL (ref 12.0–15.0)
O2 Saturation: 79 %
Potassium: 3.8 mmol/L (ref 3.5–5.1)
Sodium: 139 mmol/L (ref 135–145)
TCO2: 35 mmol/L — ABNORMAL HIGH (ref 22–32)
pCO2, Ven: 53 mmHg (ref 44–60)
pH, Ven: 7.407 (ref 7.25–7.43)
pO2, Ven: 45 mmHg (ref 32–45)

## 2022-11-10 LAB — TROPONIN I (HIGH SENSITIVITY): Troponin I (High Sensitivity): 2 ng/L (ref <18)

## 2022-11-10 MED ORDER — IOHEXOL 350 MG/ML SOLN
75.0000 mL | Freq: Once | INTRAVENOUS | Status: AC | PRN
  Administered 2022-11-10: 75 mL via INTRAVENOUS

## 2022-11-10 MED ORDER — AZITHROMYCIN 250 MG PO TABS: 250.0000 mg | ORAL_TABLET | Freq: Every day | ORAL | 0 refills | Status: DC

## 2022-11-10 MED ORDER — IPRATROPIUM-ALBUTEROL 0.5-2.5 (3) MG/3ML IN SOLN
3.0000 mL | RESPIRATORY_TRACT | Status: AC
  Administered 2022-11-10: 3 mL via RESPIRATORY_TRACT
  Filled 2022-11-10: qty 3

## 2022-11-10 MED ORDER — METHYLPREDNISOLONE SODIUM SUCC 125 MG IJ SOLR
125.0000 mg | INTRAMUSCULAR | Status: AC
  Administered 2022-11-10: 125 mg via INTRAVENOUS
  Filled 2022-11-10: qty 2

## 2022-11-10 MED ORDER — PREDNISONE 20 MG PO TABS: 40.0000 mg | ORAL_TABLET | Freq: Every day | ORAL | 0 refills | Status: DC

## 2022-11-10 NOTE — Discharge Instructions (Addendum)
Return to the ED with any new or worsening signs or symptoms Please follow-up with your pulmonologist as soon as possible Please begin wearing oxygen at home as needed Please read the attached guides concerning COPD and COPD exacerbation Please begin taking azithromycin, and taking steroids

## 2022-11-10 NOTE — ED Notes (Signed)
Walked pt on pulse ox. SpO2 was between 91-93% during ambulation.

## 2022-11-10 NOTE — Progress Notes (Signed)
VBG completed  

## 2022-11-10 NOTE — ED Notes (Signed)
Patient transported to CT 

## 2022-11-10 NOTE — ED Provider Notes (Signed)
[place] [facility] AT [facility] Provider Note   CSN: [id number] Arrival date & time: 11/10/22  1419     History  Chief Complaint  Patient presents with   Chest Pain   Shortness of Breath    Destiny Ballard is a 72 y.o. female with medical history of COPD with acute exacerbation, chronic diastolic CHF, type 2 diabetes, CAD, diabetic neuropathy, hypertension.  Patient presents to the ED for evaluation of chest pain and shortness of breath.  Patient reports that chest pain and shortness of breath began 4 days ago.  Patient states initially chest pain began, located centrally on her sternum, did not radiate.  Patient reports about 1 day later she developed shortness of breath.  Patient states she does have oxygen at home secondary to her COPD.  Patient reports she is supposed to wear this as needed.  Patient states that she has been wearing it more frequently in the last 4 days.  Patient denies ever smoking.  Patient denies any fevers, nausea, vomiting, abdominal pain, leg swelling, syncope.  Patient is endorsing lightheadedness and dizziness.   Chest Pain Associated symptoms: dizziness and shortness of breath   Associated symptoms: no fever   Shortness of Breath Associated symptoms: chest pain   Associated symptoms: no fever        Home Medications Prior to Admission medications   Medication Sig Start Date End Date Taking? Authorizing Provider  azithromycin (ZITHROMAX) 250 MG tablet Take 1 tablet (250 mg total) by mouth daily. Take first 2 tablets together, then 1 every day until finished. 11/10/22  Yes Al Decant, PA-C  predniSONE (DELTASONE) 20 MG tablet Take 2 tablets (40 mg total) by mouth daily. 11/10/22  Yes Al Decant, PA-C  albuterol (VENTOLIN HFA) 108 (90 Base) MCG/ACT inhaler Inhale 2 puffs into the lungs every 8 (eight) hours as needed for wheezing or shortness of breath. 06/05/22   Copland, Gwenlyn Found, MD  aspirin EC 81 MG tablet  Take 81 mg by mouth daily.    [provider]  calcium carbonate (TUMS EX) 750 MG chewable tablet Chew 1 tablet by mouth daily as needed for heartburn.    [provider]  Cholecalciferol (VITAMIN D3) 1000 UNITS CAPS Take 1,000 Units by mouth daily.    [provider]  Continuous Blood Gluc Sensor (DEXCOM G7 SENSOR) MISC USE TO CHECK BLOOD GLUCOSE CONTINUOUSLY. CHANGE SENSOR EVERY 10 DAYS. 09/06/22   Copland, Gwenlyn Found, MD  dapagliflozin propanediol (FARXIGA) 10 MG TABS tablet Take 1 tablet (10 mg total) by mouth daily. 08/06/22   Copland, Gwenlyn Found, MD  Dulaglutide (TRULICITY) 4.5 MG/0.5ML SOPN INJECT 4.5 MG AS DIRECTED ONCE A WEEK. 10/17/22   Copland, Gwenlyn Found, MD  famotidine (PEPCID) 40 MG tablet Take 1 tablet (40 mg total) by mouth 2 (two) times daily. 10/24/22   Meryl Dare, MD  fexofenadine (ALLEGRA) 180 MG tablet Take 180 mg by mouth daily.    [provider]  FLUoxetine (PROZAC) 40 MG capsule Take 1 capsule (40 mg total) by mouth daily. 06/28/22   Copland, Gwenlyn Found, MD  furosemide (LASIX) 20 MG tablet Take 1 tablet (20 mg total) by mouth daily. For swelling / fluid retention 09/06/22   Copland, Gwenlyn Found, MD  gabapentin (NEURONTIN) 300 MG capsule TAKE 2 CAPSULES BY MOUTH TWICE A DAY 06/05/22   Copland, Gwenlyn Found, MD  hydrALAZINE (APRESOLINE) 25 MG tablet TAKE 2 TABLETS BY MOUTH 2 TIMES DAILY. FOR BLOOD  PRESSURE Patient taking differently: Take 25 mg by mouth in the morning and at bedtime. 02/15/22   Copland, Gwenlyn Found, MD  Insulin Glargine (BASAGLAR KWIKPEN) 100 UNIT/ML Inject 30 Units into the skin daily. Profile until patient requests 01/07/22   Copland, Gwenlyn Found, MD  Insulin Pen Needle 32G X 4 MM MISC 1 Device by Does not apply route daily. 09/05/20   Shamleffer, Konrad Dolores, MD  Lancets (FREESTYLE) lancets Use to check blood glucose once a day 09/06/22   Copland, Gwenlyn Found, MD  metFORMIN (GLUCOPHAGE-XR) 500 MG 24 hr tablet TAKE 2 TABLETS BY MOUTH TWICE  A DAY 10/17/22   Copland, Gwenlyn Found, MD  metoprolol succinate (TOPROL-XL) 100 MG 24 hr tablet TAKE 1 TABLET BY MOUTH EVERY DAY 10/17/22   Copland, Gwenlyn Found, MD  montelukast (SINGULAIR) 10 MG tablet TAKE 1 TABLET (10 MG TOTAL) BY MOUTH AT BEDTIME. FOR ALLERGIES / ASTHMA 04/26/22   Copland, Gwenlyn Found, MD  nitroGLYCERIN (NITROSTAT) 0.4 MG SL tablet Place under the tongue. 09/26/15   [provider]  OVER THE COUNTER MEDICATION Take 1 each by mouth daily. CBD gummy    [provider]  pantoprazole (PROTONIX) 40 MG tablet TAKE 1 TABLET BY MOUTH EVERY DAY 11/04/22   Copland, Gwenlyn Found, MD  rosuvastatin (CRESTOR) 20 MG tablet TAKE 1 TABLET BY MOUTH DAILY. FOR CHOLESTEROL / HEART 10/17/22   Copland, Gwenlyn Found, MD  Tiotropium Bromide Monohydrate (SPIRIVA RESPIMAT) 2.5 MCG/ACT AERS INHALE 2 PUFFS BY MOUTH INTO THE LUNGS DAILY 02/18/22   Copland, Gwenlyn Found, MD  valsartan (DIOVAN) 160 MG tablet Take 1 tablet (160 mg total) by mouth daily. 04/03/22   Copland, Gwenlyn Found, MD  zinc gluconate 50 MG tablet Take 50 mg by mouth daily.    [provider]      Allergies    Prednisone    Review of Systems   Review of Systems  Constitutional:  Negative for fever.  Respiratory:  Positive for shortness of breath.   Cardiovascular:  Positive for chest pain.  Neurological:  Positive for dizziness and light-headedness.    Physical Exam Updated Vital Signs BP 134/65   Pulse 78   Temp 98.1 F (36.7 C) (Oral)   Resp 18   Ht 4\' 11"  (1.499 m)   Wt 76 kg   SpO2 100%   BMI 33.84 kg/m  Physical Exam Vitals and nursing note reviewed.  Constitutional:      General: She is not in acute distress.    Appearance: She is well-developed. She is not ill-appearing, toxic-appearing or diaphoretic.  HENT:     Head: Normocephalic and atraumatic.     Nose: Nose normal.     Mouth/Throat:     Mouth: Mucous membranes are moist.     Pharynx: Oropharynx is clear.  Eyes:     Extraocular Movements: Extraocular  movements intact.     Conjunctiva/sclera: Conjunctivae normal.     Pupils: Pupils are equal, round, and reactive to light.  Cardiovascular:     Rate and Rhythm: Normal rate and regular rhythm.  Pulmonary:     Effort: Pulmonary effort is normal. No respiratory distress.     Breath sounds: Normal breath sounds. No wheezing or rhonchi.  Abdominal:     General: Abdomen is flat. Bowel sounds are normal.     Palpations: Abdomen is soft.     Tenderness: There is no abdominal tenderness.  Musculoskeletal:     Cervical back: Normal range of motion and neck  supple. No tenderness.  Skin:    General: Skin is warm and dry.     Capillary Refill: Capillary refill takes less than 2 seconds.  Neurological:     Mental Status: She is alert and oriented to person, place, and time.     ED Results / Procedures / Treatments   Labs (all labs ordered are listed, but only abnormal results are displayed) Labs Reviewed  BASIC METABOLIC PANEL - Abnormal; Notable for the following components:      Result Value   Chloride 97 (*)    Glucose, Bld 107 (*)    All other components within normal limits  CBC - Abnormal; Notable for the following components:   WBC 11.1 (*)    MCH 25.5 (*)    All other components within normal limits  POCT I-STAT EG7 - Abnormal; Notable for the following components:   Bicarbonate 33.4 (*)    TCO2 35 (*)    Acid-Base Excess 7.0 (*)    All other components within normal limits  BLOOD GAS, VENOUS  TROPONIN I (HIGH SENSITIVITY)    EKG EKG Interpretation  Date/Time:  Sunday November 10 2022 14:31:21 EDT Ventricular Rate:  79 PR Interval:  169 QRS Duration: 83 QT Interval:  379 QTC Calculation: 435 R Axis:   44 Text Interpretation: Sinus rhythm Confirmed by Ernie Avena ([id number]) on 11/10/2022 2:37:51 PM  Radiology CT Angio Chest PE W and/or Wo Contrast  Result Date: 11/10/2022 CLINICAL DATA:  Chest pain and shortness of breath. High probability for PE. EXAM: CT ANGIOGRAPHY  CHEST WITH CONTRAST TECHNIQUE: Multidetector CT imaging of the chest was performed using the standard protocol during bolus administration of intravenous contrast. Multiplanar CT image reconstructions and MIPs were obtained to evaluate the vascular anatomy. RADIATION DOSE REDUCTION: This exam was performed according to the departmental dose-optimization program which includes automated exposure control, adjustment of the mA and/or kV according to patient size and/or use of iterative reconstruction technique. CONTRAST:  75mL OMNIPAQUE IOHEXOL 350 MG/ML SOLN COMPARISON:  CT angiogram chest 03/18/2020. CT abdomen and pelvis 03/18/2020. MRI abdomen 02/28/2018. FINDINGS: Cardiovascular: Satisfactory opacification of the pulmonary arteries to the segmental level. No evidence of pulmonary embolism. Normal heart size. No pericardial effusion. Mediastinum/Nodes: No enlarged mediastinal, hilar, or axillary lymph nodes. Thyroid gland, trachea, and esophagus demonstrate no significant findings. Lungs/Pleura: There is minimal atelectasis at the lung bases. The lungs are otherwise clear. No pleural effusion or pneumothorax. 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 Upper Abdomen: Low-attenuation area in the superior pole the right kidney is partially visualized measuring 35 Hounsfield units and 4.3 cm. This is slightly increased in size. Musculoskeletal: Bilateral breast implants are present. No acute osseous abnormality. Review of the MIP images confirms the above findings. IMPRESSION: 1. No evidence for pulmonary embolism or other acute cardiopulmonary process. 2. Partially visualized low-attenuation area in the superior pole the right kidney has slightly increased in size. This is favored as a complex cyst as seen on prior MRI. Electronically Signed   By: Darliss Cheney M.D.   On: 11/10/2022 17:09   DG Chest 2 View  Result Date: 11/10/2022 CLINICAL DATA:  Shortness of breath and chest pain EXAM: CHEST -  2 VIEW COMPARISON:  Chest x-ray September 28, 2021 FINDINGS: The heart, hila, and mediastinum are normal. No pneumothorax. No nodules or masses. Haziness over the bases is relatively symmetric, likely due to overlapping breast tissue. The patient appears to have breast implants with capsular calcification on the right.  No definite focal infiltrate. No overt edema. Anterior wedging of a lumbar vertebral body, new since the September 21, 2021 CT of the lumbar spine. IMPRESSION: 1. Haziness over the bases is likely due to overlapping breast tissue. No definite focal infiltrate. 2. Anterior wedging of a lumbar vertebral body, new since the September 21, 2021 CT of the lumbar spine. Recommend clinical correlation. Electronically Signed   By: Gerome Sam III M.D.   On: 11/10/2022 14:55    Procedures Procedures   Medications Ordered in ED Medications  ipratropium-albuterol (DUONEB) 0.5-2.5 (3) MG/3ML nebulizer solution 3 mL (3 mLs Nebulization Given 11/10/22 1622)  methylPREDNISolone sodium succinate (SOLU-MEDROL) 125 mg/2 mL injection 125 mg (125 mg Intravenous Given 11/10/22 1651)  iohexol (OMNIPAQUE) 350 MG/ML injection 75 mL (75 mLs Intravenous Contrast Given 11/10/22 1641)    ED Course/ Medical Decision Making/ A&P Clinical Course as of 11/10/22 1831  Sun Nov 10, 2022  1709 Stable 71 YOF with chest pain and SOB On 2LNC at baseline  [CC]  1720 91% ambulating, 88% on monitor [CG]    Clinical Course User Index [CC] Glyn Ade, MD [CG] Al Decant, PA-C    Medical Decision Making Amount and/or Complexity of Data Reviewed Labs: ordered. Radiology: ordered.   72 year old female presents to ED for evaluation.  Please see HPI for further details.  On examination the patient is afebrile and nontachycardic.  Lung sounds are clear bilaterally, oxygen saturation 96% room air.  Abdomen soft compressible throughout.  Neurological examination at baseline.  CBC with a slight leukocytosis to  11.1, no anemia.  BMP with chloride 97, glucose 107, stable creatinine.  Initial troponin 2, patient denies chest pain.  Chest x-ray unremarkable.  EKG is nonischemic.  Will proceed with CT angiogram to assess for underlying blood clot or pneumonia.  CT angio shows no evidence of PE, consolidations or groundglass opacities.  Patient states she has not seen her pulmonologist in quite some time, over a year.  Patient could be suffering from COPD exacerbation. Patient will be sent home with 5 days of steroids, Z-Pak.  Patient will be encouraged to wear oxygen at home but she states that she has.  The patient was encouraged to return to the ED with any worsening shortness of breath despite oxygen, fevers, increased chest pain or any new symptoms.  Patient voiced understanding with instructions.  The patient was encouraged to follow-up with her pulmonology team.  Patient discharged stable condition after discussion with attending Dr. Doran Durand.  Addendum: Patient reports that she has nightmares secondary to prednisone.  I advised the patient that if she does not wish to take prednisone she does not have to.   Final Clinical Impression(s) / ED Diagnoses Final diagnoses:  COPD exacerbation (HCC)    Rx / DC Orders ED Discharge Orders          Ordered    predniSONE (DELTASONE) 20 MG tablet  Daily        11/10/22 1829    azithromycin (ZITHROMAX) 250 MG tablet  Daily        11/10/22 1829              Clent Ridges 11/10/22 1831    Glyn Ade, MD 11/10/22 1857

## 2022-11-10 NOTE — ED Triage Notes (Signed)
Central chest pain and shob x 4 days. Reports when she stands she becomes dizzy and has been more lethargic than normal.

## 2022-11-12 ENCOUNTER — Telehealth: Payer: Self-pay | Admitting: Family Medicine

## 2022-11-12 NOTE — Telephone Encounter (Signed)
Destiny Ballard with [facility] [employer] called to follow up on status of document they sent to [place] on 10/30/22 to be signed by provider for pt's freestyle libre sensors. Marisue Ivan will refax form.

## 2022-11-12 NOTE — Telephone Encounter (Signed)
Form placed in folder

## 2022-11-15 ENCOUNTER — Other Ambulatory Visit: Payer: Medicare Other

## 2022-11-15 ENCOUNTER — Other Ambulatory Visit: Payer: Self-pay

## 2022-11-15 DIAGNOSIS — R140 Abdominal distension (gaseous): Secondary | ICD-10-CM

## 2022-11-15 DIAGNOSIS — K2950 Unspecified chronic gastritis without bleeding: Secondary | ICD-10-CM | POA: Diagnosis not present

## 2022-11-15 NOTE — Telephone Encounter (Signed)
Did you come across this form? If not I have a copy. It just needs one signature.

## 2022-11-15 NOTE — Telephone Encounter (Signed)
Form in folder for completion.  

## 2022-11-15 NOTE — Telephone Encounter (Signed)
[facility] [employer] called to follow up on form for patient. Advised it was placed in provider's box. They are requesting it to be completed and faxed back next week so they can get patient her sensors

## 2022-11-18 ENCOUNTER — Ambulatory Visit: Payer: Medicare Other | Attending: Cardiology | Admitting: Cardiology

## 2022-11-18 ENCOUNTER — Encounter: Payer: Self-pay | Admitting: Cardiology

## 2022-11-18 VITALS — BP 130/66 | HR 78 | Ht 59.0 in | Wt 166.0 lb

## 2022-11-18 DIAGNOSIS — E782 Mixed hyperlipidemia: Secondary | ICD-10-CM | POA: Insufficient documentation

## 2022-11-18 DIAGNOSIS — I25118 Atherosclerotic heart disease of native coronary artery with other forms of angina pectoris: Secondary | ICD-10-CM | POA: Diagnosis not present

## 2022-11-18 DIAGNOSIS — G4733 Obstructive sleep apnea (adult) (pediatric): Secondary | ICD-10-CM | POA: Diagnosis not present

## 2022-11-18 DIAGNOSIS — I5032 Chronic diastolic (congestive) heart failure: Secondary | ICD-10-CM | POA: Diagnosis not present

## 2022-11-18 NOTE — Progress Notes (Signed)
Cardiology Office Note:    Date:  11/18/2022   ID:  SHELDA Ballard, DOB 10/14/1950, MRN [id number]  PCP:  Pearline Cables, MD  Cardiologist:  Gypsy Balsam, MD    Referring MD: Pearline Cables, MD   Chief Complaint  Patient presents with   Follow-up  Doing fine  History of Present Illness:    Destiny Ballard is a 72 y.o. female  with past medical history significant for coronary artery disease, status post PTCA and stenting of the mid LAD in 2007. Last cardiac catheterization done in 2019 did not show any obstructive lesion. She did have stress test done recently which showed no evidence of ischemia. Also have history of dyslipidemia, essential hypertension, diabetes which is still not well controlled.  Comes today to months for follow-up.  Overall doing well.  Denies of any chest pain tightness squeezing pressure burning chest she admits that she lives a very sedentary lifestyle and my question is why she is no more active she jokingly tells me that she is retired.  Denies having any chest pain or recently.  Past Medical History:  Diagnosis Date   Anxiety    Prior suicide attempt   CAD (coronary artery disease)    a) s/p DES to LAD 07/2005 b) Last Myoview low risk 11/2011 showing small fixed apical perfusion defect (prior MI vs attenuation) but no ischemia - normal EF.   Cervical spondylosis    Chest pain 12/10/2011   Chronic diastolic CHF (congestive heart failure) (HCC) 09/18/2017   Chronic eczematous otitis externa of both ears 11/18/2019   Chronic rhinitis 08/07/2016   Complication of anesthesia    developed pneumonia after nerve block was hospitalized   Constipation 12/18/2020   COPD with acute exacerbation (HCC) 04/25/2016   Coronary atherosclerosis 06/28/2008   Depression    Depression with anxiety    Diabetes mellitus (HCC) 09/08/2017   Diabetic neuropathy (HCC)    Dyspnea on exertion 09/02/2012   CXR 07/2012:  No acute process.     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 EF 60%   CARDIAC CATHETERIZATION N/A 01/29/2016   Procedure: Left Heart Cath and Coronary  Angiography;  Surgeon: Tonny Bollman, MD;  Location: [facility] INVASIVE CV LAB;  Service: Cardiovascular;  Laterality: N/A;   CERVICAL SPONDYLOSIS     SINGLE LEVEL FUSION   CHILDBIRTH     X3   COLONOSCOPY  2010   normal   COLONOSCOPY WITH PROPOFOL  10/01/2021   CORONARY STENT PLACEMENT  07/15/2005   LEFT ANTERIOR DESCENDING   FOREARM FRACTURE SURGERY  07/15/2008   hand and shoulder    INCISION AND DRAINAGE BREAST ABSCESS  01/05/2012       INCISION AND DRAINAGE PERIRECTAL ABSCESS N/A 02/18/2014   Procedure:  IRRIGATION AND DEBRIDEMENT PERIRECTAL ABSCESS;  Surgeon: Valarie Merino, MD;  Location: [facility] ORS;  Service: General;  Laterality: N/A;   LEFT HEART CATH AND CORONARY ANGIOGRAPHY N/A 10/10/2017   Procedure: LEFT HEART CATH AND CORONARY ANGIOGRAPHY;  Surgeon: Kathleene Hazel, MD;  Location: [facility] INVASIVE CV LAB;  Service: Cardiovascular;  Laterality: N/A;   LUMBAR LAMINECTOMY     ORIF WRIST FRACTURE Right 11/27/2021   Procedure: OPEN REDUCTION INTERNAL FIXATION (ORIF) WRIST FRACTURE;  Surgeon: Teryl Lucy, MD;  Location: [facility];  Service: Orthopedics;  Laterality: Right;   ROTATOR CUFF REPAIR     bilaterla   TONSILLECTOMY AND ADENOIDECTOMY     TUBAL LIGATION     VIDEO BRONCHOSCOPY Bilateral 08/13/2016   Procedure: VIDEO BRONCHOSCOPY WITHOUT FLUORO;  Surgeon: Leslye Peer, MD;  Location: [facility];  Service: Cardiopulmonary;  Laterality: Bilateral;    Current Medications: Current Meds  Medication Sig   albuterol (VENTOLIN HFA) 108 (90 Base) MCG/ACT inhaler Inhale 2 puffs into the lungs every 8 (eight) hours as needed for wheezing or shortness of breath.   aspirin EC 81 MG tablet Take 81 mg by mouth daily.   calcium carbonate (TUMS EX) 750 MG chewable tablet Chew 1 tablet by mouth daily as needed for heartburn.   Cholecalciferol (VITAMIN D3) 1000 UNITS CAPS Take 1,000 Units by mouth daily.   Continuous Blood Gluc Sensor (DEXCOM G7 SENSOR) MISC USE TO CHECK BLOOD GLUCOSE CONTINUOUSLY. CHANGE SENSOR EVERY 10 DAYS. (Patient taking differently: 1 each by Other route daily. Free style brand Use to check blood glucose continuously. Change sensor every 10 days.)   dapagliflozin propanediol (FARXIGA) 10 MG TABS tablet Take 1 tablet (10 mg total) by mouth daily.   Dulaglutide (TRULICITY) 4.5 MG/0.5ML SOPN INJECT 4.5 MG AS DIRECTED ONCE A WEEK.   famotidine (PEPCID) 40 MG tablet Take 1 tablet (40 mg total) by mouth 2 (two) times daily.   fexofenadine (ALLEGRA) 180 MG tablet Take 180 mg by  mouth daily.   FLUoxetine (PROZAC) 40 MG capsule Take 1 capsule (40 mg total) by mouth daily.   furosemide (LASIX) 20 MG tablet Take 1 tablet (20 mg total) by mouth daily. For swelling / fluid retention   gabapentin (NEURONTIN) 300 MG capsule TAKE 2 CAPSULES BY MOUTH TWICE A DAY (Patient taking differently: Take 600 mg by mouth 2 (two) times daily.)   hydrALAZINE (APRESOLINE) 25 MG tablet TAKE 2 TABLETS BY MOUTH 2 TIMES DAILY. FOR BLOOD PRESSURE (Patient taking differently: Take 25 mg by mouth in the morning and at bedtime.)   Insulin Glargine (BASAGLAR KWIKPEN) 100 UNIT/ML Inject 30 Units into the skin daily. Profile until patient requests   Insulin Pen Needle 32G X 4 MM MISC 1 Device by Does not apply route daily.   Lancets (FREESTYLE) lancets Use to check blood glucose once a day (Patient taking differently: 1 each by Other route  as needed for other (see below). Use to check blood glucose once a day)   metFORMIN (GLUCOPHAGE-XR) 500 MG 24 hr tablet TAKE 2 TABLETS BY MOUTH TWICE A DAY   metoprolol succinate (TOPROL-XL) 100 MG 24 hr tablet TAKE 1 TABLET BY MOUTH EVERY DAY   montelukast (SINGULAIR) 10 MG tablet TAKE 1 TABLET (10 MG TOTAL) BY MOUTH AT BEDTIME. FOR ALLERGIES / ASTHMA   nitroGLYCERIN (NITROSTAT) 0.4 MG SL tablet Place 0.4 mg under the tongue every 5 (five) minutes as needed for chest pain.   OVER THE COUNTER MEDICATION Take 1 each by mouth daily. CBD gummy   pantoprazole (PROTONIX) 40 MG tablet TAKE 1 TABLET BY MOUTH EVERY DAY   rosuvastatin (CRESTOR) 20 MG tablet TAKE 1 TABLET BY MOUTH DAILY. FOR CHOLESTEROL / HEART (Patient taking differently: Take 20 mg by mouth daily.)   Tiotropium Bromide Monohydrate (SPIRIVA RESPIMAT) 2.5 MCG/ACT AERS INHALE 2 PUFFS BY MOUTH INTO THE LUNGS DAILY (Patient taking differently: Inhale 2 each into the lungs daily.)   valsartan (DIOVAN) 160 MG tablet Take 1 tablet (160 mg total) by mouth daily.   zinc gluconate 50 MG tablet Take 50 mg by mouth daily.    [DISCONTINUED] azithromycin (ZITHROMAX) 250 MG tablet Take 1 tablet (250 mg total) by mouth daily. Take first 2 tablets together, then 1 every day until finished.   [DISCONTINUED] predniSONE (DELTASONE) 20 MG tablet Take 2 tablets (40 mg total) by mouth daily.     Allergies:   Prednisone   Social History   Socioeconomic History   Marital status: Widowed    Spouse name: Gerri Spore   Number of children: 3   Years of education: 12   Highest education level: Not on file  Occupational History   Occupation: retired from [employer]    Comment: 11/2012  Tobacco Use   Smoking status: Never    Passive exposure: Yes   Smokeless tobacco: Never  Vaping Use   Vaping Use: Never used  Substance and Sexual Activity   Alcohol use: Yes    Comment: occ   Drug use: No   Sexual activity: Not Currently    Partners: Male    Birth control/protection: None  Other Topics Concern   Not on file  Social History Narrative   Their eldest daughter lives upstairs.   Social Determinants of Health   Financial Resource Strain: Low Risk  (10/25/2022)   Overall Financial Resource Strain (CARDIA)    Difficulty of Paying Living Expenses: Not hard at all  Food Insecurity: No Food Insecurity (10/25/2022)   Hunger Vital Sign    Worried About Running Out of Food in the Last Year: Never true    Ran Out of Food in the Last Year: Never true  Transportation Needs: No Transportation Needs (10/25/2022)   PRAPARE - Administrator, Civil Service (Medical): No    Lack of Transportation (Non-Medical): No  Physical Activity: Inactive (10/25/2022)   Exercise Vital Sign    Days of Exercise per Week: 0 days    Minutes of Exercise per Session: 0 min  Stress: No Stress Concern Present (10/25/2022)   [employer] of Occupational Health - Occupational Stress Questionnaire    Feeling of Stress : Not at all  Social Connections: Moderately Isolated (10/25/2022)   Social Connection and Isolation Panel [NHANES]    Frequency of  Communication with Friends and Family: Never    Frequency of Social Gatherings with Friends and Family: Three times a week  Attends Religious Services: 1 to 4 times per year    Active Member of Clubs or Organizations: No    Attends Banker Meetings: Never    Marital Status: Widowed     Family History: The patient's family history includes Allergies in an other family member; Asthma in her daughter and mother; Cancer in her daughter and sister; Cervical cancer in her daughter; Colon cancer (age of onset: 64) in her sister; Diabetes in her mother and sister; Heart attack in her mother; Heart disease in her mother; Lung cancer in her mother; Suicidality in her father. ROS:   Please see the history of present illness.    All 14 point review of systems negative except as described per history of present illness  EKGs/Labs/Other Studies Reviewed:      Recent Labs: 11/29/2021: Magnesium 2.1 09/18/2022: ALT 16; TSH 1.08 11/10/2022: BUN 15; Creatinine, Ser 0.60; Hemoglobin 13.6; Platelets 260; Potassium 3.8; Sodium 139  Recent Lipid Panel    Component Value Date/Time   CHOL 122 09/18/2022 0832   CHOL 130 01/01/2021 1144   TRIG 251.0 (H) 09/18/2022 0832   HDL 46.00 09/18/2022 0832   HDL 44 01/01/2021 1144   CHOLHDL 3 09/18/2022 0832   VLDL 50.2 (H) 09/18/2022 0832   LDLCALC 45 01/01/2021 1144   LDLDIRECT 51.0 09/18/2022 0832    Physical Exam:    VS:  BP 130/66 (BP Location: Left Arm, Patient Position: Sitting)   Pulse 78   Ht 4\' 11"  (1.499 m)   Wt 166 lb (75.3 kg)   SpO2 96%   BMI 33.53 kg/m     Wt Readings from Last 3 Encounters:  11/18/22 166 lb (75.3 kg)  11/10/22 167 lb 8.8 oz (76 kg)  10/30/22 168 lb 12.8 oz (76.6 kg)     GEN:  Well nourished, well developed in no acute distress HEENT: Normal NECK: No JVD; No carotid bruits LYMPHATICS: No lymphadenopathy CARDIAC: RRR, no murmurs, no rubs, no gallops RESPIRATORY:  Clear to auscultation without rales,  wheezing or rhonchi  ABDOMEN: Soft, non-tender, non-distended MUSCULOSKELETAL:  No edema; No deformity  SKIN: Warm and dry LOWER EXTREMITIES: no swelling NEUROLOGIC:  Alert and oriented x 3 PSYCHIATRIC:  Normal affect   ASSESSMENT:    1. Coronary artery disease involving native heart with other form of angina pectoris, unspecified vessel or lesion type (HCC)   2. Chronic diastolic CHF (congestive heart failure) (HCC)   3. OSA (obstructive sleep apnea)   4. Mixed hyperlipidemia    PLAN:    In order of problems listed above:  Coronary disease stable from that point review on antiplatelet therapy without angina pectoris.  I encouraged her to BL be more active which will be beneficial for her physical wellbeing psychological wellbeing as well as for diabetes. Diastolic congestive heart failure.  Seems to be stable from that point to an appropriate medications. Dyslipidemia I did review K PN which show me LDL 45 HDL 46 cholesterol 161 this is from March 2024 we will continue present management   Medication Adjustments/Labs and Tests Ordered: Current medicines are reviewed at length with the patient today.  Concerns regarding medicines are outlined above.  No orders of the defined types were placed in this encounter.  Medication changes: No orders of the defined types were placed in this encounter.   Signed, Georgeanna Lea, MD, [facility] 11/18/2022 8:50 AM    [facility]

## 2022-11-18 NOTE — Patient Instructions (Signed)
Medication Instructions:  Your physician recommends that you continue on your current medications as directed. Please refer to the Current Medication list given to you today.  *If you need a refill on your cardiac medications before your next appointment, please call your pharmacy*   Lab Work: None Ordered If you have labs (blood work) drawn today and your tests are completely normal, you will receive your results only by: MyChart Message (if you have MyChart) OR A paper copy in the mail If you have any lab test that is abnormal or we need to change your treatment, we will call you to review the results.   Testing/Procedures: None Ordered   Follow-Up: At [facility], you and your health needs are our priority.  As part of our continuing mission to provide you with exceptional heart care, we have created designated Provider Care Teams.  These Care Teams include your primary Cardiologist (physician) and Advanced Practice Providers (APPs -  Physician Assistants and Nurse Practitioners) who all work together to provide you with the care you need, when you need it.  We recommend signing up for the patient portal called "MyChart".  Sign up information is provided on this After Visit Summary.  MyChart is used to connect with patients for Virtual Visits (Telemedicine).  Patients are able to view lab/test results, encounter notes, upcoming appointments, etc.  Non-urgent messages can be sent to your provider as well.   To learn more about what you can do with MyChart, go to [email or web address].    Your next appointment:   12 month(s)  The format for your next appointment:   In Person  Provider:   Robert Krasowski, MD    Other Instructions NA  

## 2022-11-20 LAB — ANTI-PARIETAL ANTIBODY: PARIETAL CELL AB SCREEN: NEGATIVE

## 2022-11-20 LAB — GASTRIN: Gastrin: 908 pg/mL — ABNORMAL HIGH (ref <=100)

## 2022-11-20 LAB — INTRINSIC FACTOR ANTIBODIES: Intrinsic Factor: POSITIVE — AB

## 2023-01-25 NOTE — Progress Notes (Signed)
[facility] [address], [address] [place], [place] [address] [phone]  Date:  02/03/2023   Name:  Destiny Ballard   DOB:  09/19/1950   MRN:  [id number]  PCP:  Pearline Cables, MD    Chief Complaint: 3 month f/u (Concerns/ questions: none/Mammogram due)   History of Present Illness:  Destiny Ballard is a 72 y.o. very pleasant female patient who presents with the following:  Pt seen today for periodic recheck Most recent visit with myself was in April of this year for sick visit  - history of CAD (stents 2007, most recent cath 2019 negative) with cardiology care, hypertension, CHF, sleep apnea, COPD, diabetes, GERD, hyperlipidemia  Diabetes has been well-controlled-recently she has been using a continuous blood glucose monitor, freestyle libre 3 which is aiding her in monitoring her blood sugar Lab Results  Component Value Date   HGBA1C 7.2 (H) 09/18/2022   Since her last visit she followed up with cardiology in May Coronary disease stable from that point review on antiplatelet therapy without angina pectoris.  I encouraged her to be more active which will be beneficial for her physical wellbeing psychological wellbeing as well as for diabetes. Diastolic congestive heart failure.  Seems to be stable from that point to an appropriate medications. Dyslipidemia I did review K PN which show me LDL 45 HDL 46 cholesterol 027 this is from March 2024 we will continue present management  Mammogram appears to be due- recommended and offered to order but she declines for now  Recommend COVID booster Can update A1c today  She is compliant with her diabetes treatment which includes Basaglar 30 mg once daily, Trulicity 4.5 mg weekly, Farxiga 10 mg  Aspirin 81 Farxiga 10 Trulicity 4.5 Fluoxetine Lasix Gabapentin 600 twice daily Hydralazine 25 twice daily Basaglar 30 units Metformin XR 500-taking 2 twice daily Toprol-XL  100 Diovan 160 Singulair Crestor 20  She is not having any hypoglycemia episodes - her lowest glucose might be around 120 Her blood sugars may go as high as 200 or so  Destiny Ballard notes that she sometimes has some epigastric discomfort.  She notes that she had an upper GI and colonoscopy in March per Dr. Russella Dar.  She is taking a daily PPI but notes symptoms do persist.  I encouraged her to contact her gastroenterologist and update them about her symptoms  Patient Active Problem List   Diagnosis Date Noted   Diabetes mellitus (HCC) 03/01/2022   Hypoxia 11/27/2021   Osteopenia 06/27/2021   Melena 12/18/2020   History of colonic polyps 12/18/2020   Constipation 12/18/2020   Obesity    Iron deficiency anemia    Insulin resistance    Hyperlipidemia    Cervical spondylosis    Anxiety    Pneumonia due to COVID-19 virus 08/21/2020   Hyperlipidemia associated with type 2 diabetes mellitus (HCC) 08/21/2020   Type 2 diabetes mellitus with diabetic polyneuropathy, with long-term current use of insulin (HCC) 12/10/2019   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   Tinnitus of both ears 11/18/2019   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   Eustachian tube dysfunction, bilateral 11/18/2019   Chronic eczematous otitis externa of both ears 11/18/2019   Obesity (BMI 30-39.9) 08/24/2018   Depression with anxiety 08/24/2018   CAD (coronary artery disease) 08/24/2018   Abnormal cardiovascular stress test 08/24/2018   Precordial chest pain    Sepsis (HCC) 09/18/2017  Chronic diastolic CHF (congestive heart failure) (HCC) 09/18/2017   Chronic rhinitis 08/07/2016   COPD with acute exacerbation (HCC) 04/25/2016   Hypertension associated with diabetes (HCC) 01/30/2016   Diarrhea 09/26/2015   OSA (obstructive sleep apnea) 05/06/2014   Dyspnea on exertion 09/02/2012   GERD 06/28/2008    Past Medical History:  Diagnosis Date   Anxiety    Prior suicide attempt    CAD (coronary artery disease)    a) s/p DES to LAD 07/2005 b) Last Myoview low risk 11/2011 showing small fixed apical perfusion defect (prior MI vs attenuation) but no ischemia - normal EF.   Cervical spondylosis    Chest pain 12/10/2011   Chronic diastolic CHF (congestive heart failure) (HCC) 09/18/2017   Chronic eczematous otitis externa of both ears 11/18/2019   Chronic rhinitis 08/07/2016   Complication of anesthesia    developed pneumonia after nerve block was hospitalized   Constipation 12/18/2020   COPD with acute exacerbation (HCC) 04/25/2016   Coronary atherosclerosis 06/28/2008   Depression    Depression with anxiety    Diabetes mellitus (HCC) 09/08/2017   Diabetic neuropathy (HCC)    Dyspnea on exertion 09/02/2012   CXR 07/2012:  No acute process.     Eustachian tube dysfunction, bilateral 11/18/2019   GERD 06/28/2008   Hearing loss    Left   History of colonic polyps 12/18/2020   History of kidney stones    HLD (hyperlipidemia)    Hyperlipidemia associated with type 2 diabetes mellitus (HCC) 08/21/2020   Hypertension    Hypertension associated with diabetes (HCC) 01/30/2016   Hypokalemia 01/27/2016   Insulin resistance    Iron deficiency anemia    Leukocytosis    history of mild leukocytosis   Lobar pneumonia, unspecified organism (HCC) 11/13/2017   Melena 12/18/2020   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   Obesity (BMI 30-39.9) 08/24/2018   Last Assessment & Plan:  Exercise and weight reduction is encouraged.   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 EF 60%   CARDIAC CATHETERIZATION N/A 01/29/2016   Procedure: Left Heart Cath and Coronary Angiography;  Surgeon: Tonny Bollman, MD;  Location: [facility] INVASIVE CV LAB;  Service: Cardiovascular;  Laterality: N/A;   CERVICAL SPONDYLOSIS     SINGLE LEVEL FUSION   CHILDBIRTH     X3   COLONOSCOPY  2010   normal   COLONOSCOPY WITH PROPOFOL  10/01/2021   CORONARY STENT PLACEMENT  07/15/2005   LEFT ANTERIOR DESCENDING   FOREARM FRACTURE SURGERY  07/15/2008   hand and shoulder    INCISION AND DRAINAGE BREAST ABSCESS  01/05/2012       INCISION AND DRAINAGE PERIRECTAL ABSCESS N/A 02/18/2014   Procedure: IRRIGATION AND DEBRIDEMENT PERIRECTAL ABSCESS;  Surgeon: Valarie Merino, MD;  Location: [facility] OR[facility];  Service: General;  Laterality: N/A;   LEFT HEART CATH AND CORONARY ANGIOGRAPHY N/A 10/10/2017   Procedure:  LEFT HEART CATH AND CORONARY ANGIOGRAPHY;  Surgeon: Kathleene Hazel, MD;  Location: [facility] INVASIVE CV LAB;  Service: Cardiovascular;  Laterality: N/A;   LUMBAR LAMINECTOMY     ORIF WRIST FRACTURE Right 11/27/2021   Procedure: OPEN REDUCTION INTERNAL FIXATION (ORIF) WRIST FRACTURE;  Surgeon: Teryl Lucy, MD;  Location: [facility];  Service: Orthopedics;  Laterality: Right;   ROTATOR CUFF REPAIR     bilaterla   TONSILLECTOMY AND ADENOIDECTOMY     TUBAL LIGATION     VIDEO BRONCHOSCOPY Bilateral 08/13/2016   Procedure: VIDEO BRONCHOSCOPY WITHOUT FLUORO;  Surgeon: Leslye Peer, MD;  Location: [facility];  Service: Cardiopulmonary;  Laterality: Bilateral;    Social History   Tobacco Use   Smoking status: Never    Passive exposure: Yes   Smokeless tobacco: Never  Vaping Use   Vaping status: Never Used  Substance Use Topics   Alcohol use: Yes    Comment: occ   Drug use: No    Family History  Problem  Relation Age of Onset   Heart attack Mother    Diabetes Mother    Lung cancer Mother    Asthma Mother    Heart disease Mother    Suicidality Father        "killed himself"   Diabetes Sister    Cancer Sister    Colon cancer Sister 39   Asthma Daughter        x 2   Cancer Daughter        pre-cancerous polyp   Cervical cancer Daughter        cervical    Allergies Other        all family--seasonal allergies    Allergies  Allergen Reactions   Prednisone Other (See Comments)    REACTION: mood swings, nightmares. "Shot doesn't bother me, reaction is just with the pill" she states she has had the steroid injections before. From our records methylprednisone was given to her in 2013 without any complications.    Medication list has been reviewed and updated.  Current Outpatient Medications on File Prior to Visit  Medication Sig Dispense Refill   albuterol (VENTOLIN HFA) 108 (90 Base) MCG/ACT inhaler Inhale 2 puffs into the lungs every 8 (eight) hours as needed for wheezing or shortness of breath. 18 g 5   aspirin EC 81 MG tablet Take 81 mg by mouth daily.     calcium carbonate (TUMS EX) 750 MG chewable tablet Chew 1 tablet by mouth daily as needed for heartburn.     Cholecalciferol (VITAMIN D3) 1000 UNITS CAPS Take 1,000 Units by mouth daily.     Continuous Glucose Receiver (FREESTYLE LIBRE 3 READER) DEVI by Does not apply route. Free style brand Use to check blood glucose continuously. Change sensor every 10 day as of 11/18/22     Continuous Glucose Sensor (FREESTYLE LIBRE 3 SENSOR) MISC by Does not apply route. Free style brand Use to check blood glucose continuously. Change sensor every 10 day as of 11/18/22     dapagliflozin propanediol (FARXIGA) 10 MG TABS tablet Take 1 tablet (10 mg total) by mouth daily. 90 tablet 3   Dulaglutide (TRULICITY) 4.5 MG/0.5ML SOPN INJECT 4.5 MG AS DIRECTED ONCE A WEEK. 4.5 mL 1   famotidine (PEPCID) 40 MG tablet Take 1 tablet (40 mg total) by mouth 2 (two)  times daily. 60 tablet 6   fexofenadine (ALLEGRA) 180 MG tablet Take 180 mg by mouth daily.     FLUoxetine (PROZAC) 40  MG capsule Take 1 capsule (40 mg total) by mouth daily. 90 capsule 1   furosemide (LASIX) 20 MG tablet Take 1 tablet (20 mg total) by mouth daily. For swelling / fluid retention 90 tablet 1   gabapentin (NEURONTIN) 300 MG capsule TAKE 2 CAPSULES BY MOUTH TWICE A DAY (Patient taking differently: Take 600 mg by mouth 2 (two) times daily.) 360 capsule 1   hydrALAZINE (APRESOLINE) 25 MG tablet TAKE 2 TABLETS BY MOUTH 2 TIMES DAILY. FOR BLOOD PRESSURE (Patient taking differently: Take 25 mg by mouth in the morning and at bedtime.) 360 tablet 1   Insulin Glargine (BASAGLAR KWIKPEN) 100 UNIT/ML Inject 30 Units into the skin daily. Profile until patient requests 15 mL 1   Insulin Pen Needle 32G X 4 MM MISC 1 Device by Does not apply route daily. 100 each 3   Lancets (FREESTYLE) lancets Use to check blood glucose once a day (Patient taking differently: 1 each by Other route as needed for other (see below). Use to check blood glucose once a day) 100 each 5   metFORMIN (GLUCOPHAGE-XR) 500 MG 24 hr tablet TAKE 2 TABLETS BY MOUTH TWICE A DAY 360 tablet 3   metoprolol succinate (TOPROL-XL) 100 MG 24 hr tablet TAKE 1 TABLET BY MOUTH EVERY DAY 90 tablet 1   montelukast (SINGULAIR) 10 MG tablet TAKE 1 TABLET (10 MG TOTAL) BY MOUTH AT BEDTIME. FOR ALLERGIES / ASTHMA 90 tablet 1   nitroGLYCERIN (NITROSTAT) 0.4 MG SL tablet Place 0.4 mg under the tongue every 5 (five) minutes as needed for chest pain.     OVER THE COUNTER MEDICATION Take 1 each by mouth daily. CBD gummy     pantoprazole (PROTONIX) 40 MG tablet TAKE 1 TABLET BY MOUTH EVERY DAY 90 tablet 1   rosuvastatin (CRESTOR) 20 MG tablet TAKE 1 TABLET BY MOUTH DAILY. FOR CHOLESTEROL / HEART (Patient taking differently: Take 20 mg by mouth daily.) 90 tablet 3   Tiotropium Bromide Monohydrate (SPIRIVA RESPIMAT) 2.5 MCG/ACT AERS INHALE 2 PUFFS BY MOUTH  INTO THE LUNGS DAILY (Patient taking differently: Inhale 2 each into the lungs daily.) 4 g 5   valsartan (DIOVAN) 160 MG tablet Take 1 tablet (160 mg total) by mouth daily. 90 tablet 1   zinc gluconate 50 MG tablet Take 50 mg by mouth daily.     No current facility-administered medications on file prior to visit.    Review of Systems:  As per HPI- otherwise negative.   Physical Examination: Vitals:   02/03/23 1032  BP: 122/60  Pulse: 92  Resp: 18  Temp: 98 F (36.7 C)  SpO2: 98%   Vitals:   02/03/23 1032  Weight: 164 lb 12.8 oz (74.8 kg)  Height: 4\' 11"  (1.499 m)   Body mass index is 33.29 kg/m. Ideal Body Weight: Weight in (lb) to have BMI = 25: 123.5  GEN: no acute distress.  Mildly obese, looks well HEENT: Atraumatic, Normocephalic.  Ears and Nose: No external deformity. CV: RRR, No M/G/R. No JVD. No thrill. No extra heart sounds. PULM: CTA B, no wheezes, crackles, rhonchi. No retractions. No resp. distress. No accessory muscle use. ABD: S, NT, ND,. No rebound. No HSM. EXTR: No c/c/e PSYCH: Normally interactive. Conversant.    Assessment and Plan: Type 2 diabetes mellitus with other circulatory complication, with long-term current use of insulin (HCC) - Plan: Hemoglobin A1c  COPD without exacerbation (HCC)  Hypertension associated with diabetes (HCC)  Coronary artery disease involving native heart with  other form of angina pectoris, unspecified vessel or lesion type (HCC)  Chronic superficial gastritis without bleeding  Patient seen today for follow-up of her diabetes.  Will check A1c today.  She is using freestyle libre 3 continuous glucose monitor with good results.  No issues with hypoglycemia, she is compliant with her insulin regimen  I will be in touch with her pending A1c results  Blood pressure under good control on current regimen  Patient notes she is taking Tums as well pantoprazole 40 but still has significant gastritis which was seen on prior  upper GI scope in March.  I encouraged her to follow-up with her gastroenterologist about the symptoms  Her COPD has been well-controlled with current regimen of Singulair, Spiriva, albuterol as needed  Signed Abbe Amsterdam, MD Received A1c, message to pt  Results for orders placed or performed in visit on 02/03/23  Hemoglobin A1c  Result Value Ref Range   Hgb A1c MFr Bld 6.9 (H) 4.6 - 6.5 %

## 2023-01-25 NOTE — Patient Instructions (Addendum)
It was good to see again today I think you are actually taking pantoprazole for your stomach? Make sure you are taking this, let me know if not   I will be in touch with your A1c Assuming all is well we can plan to visit in about 6 months

## 2023-01-30 ENCOUNTER — Other Ambulatory Visit: Payer: Self-pay

## 2023-01-30 DIAGNOSIS — E1159 Type 2 diabetes mellitus with other circulatory complications: Secondary | ICD-10-CM

## 2023-02-03 ENCOUNTER — Ambulatory Visit (INDEPENDENT_AMBULATORY_CARE_PROVIDER_SITE_OTHER): Payer: Medicare Other | Admitting: Family Medicine

## 2023-02-03 ENCOUNTER — Encounter: Payer: Self-pay | Admitting: Family Medicine

## 2023-02-03 VITALS — BP 122/60 | HR 92 | Temp 98.0°F | Resp 18 | Ht 59.0 in | Wt 164.8 lb

## 2023-02-03 DIAGNOSIS — Z794 Long term (current) use of insulin: Secondary | ICD-10-CM | POA: Diagnosis not present

## 2023-02-03 DIAGNOSIS — K2930 Chronic superficial gastritis without bleeding: Secondary | ICD-10-CM | POA: Diagnosis not present

## 2023-02-03 DIAGNOSIS — I152 Hypertension secondary to endocrine disorders: Secondary | ICD-10-CM

## 2023-02-03 DIAGNOSIS — E1159 Type 2 diabetes mellitus with other circulatory complications: Secondary | ICD-10-CM | POA: Diagnosis not present

## 2023-02-03 DIAGNOSIS — I25118 Atherosclerotic heart disease of native coronary artery with other forms of angina pectoris: Secondary | ICD-10-CM | POA: Diagnosis not present

## 2023-02-03 DIAGNOSIS — J449 Chronic obstructive pulmonary disease, unspecified: Secondary | ICD-10-CM | POA: Diagnosis not present

## 2023-02-03 LAB — HEMOGLOBIN A1C: Hgb A1c MFr Bld: 6.9 % — ABNORMAL HIGH (ref 4.6–6.5)

## 2023-02-27 ENCOUNTER — Other Ambulatory Visit: Payer: Self-pay | Admitting: Family Medicine

## 2023-04-19 ENCOUNTER — Other Ambulatory Visit: Payer: Self-pay | Admitting: Physician Assistant

## 2023-04-19 DIAGNOSIS — R1013 Epigastric pain: Secondary | ICD-10-CM

## 2023-04-22 ENCOUNTER — Other Ambulatory Visit: Payer: Self-pay | Admitting: Family Medicine

## 2023-04-22 DIAGNOSIS — E1142 Type 2 diabetes mellitus with diabetic polyneuropathy: Secondary | ICD-10-CM

## 2023-04-22 DIAGNOSIS — R600 Localized edema: Secondary | ICD-10-CM

## 2023-04-22 DIAGNOSIS — F32A Depression, unspecified: Secondary | ICD-10-CM

## 2023-05-11 ENCOUNTER — Other Ambulatory Visit: Payer: Self-pay | Admitting: Family Medicine

## 2023-05-11 DIAGNOSIS — J302 Other seasonal allergic rhinitis: Secondary | ICD-10-CM

## 2023-07-28 ENCOUNTER — Other Ambulatory Visit: Payer: Self-pay | Admitting: Family Medicine

## 2023-07-28 ENCOUNTER — Other Ambulatory Visit: Payer: Self-pay | Admitting: Physician Assistant

## 2023-07-28 DIAGNOSIS — F32A Depression, unspecified: Secondary | ICD-10-CM

## 2023-07-28 DIAGNOSIS — R1013 Epigastric pain: Secondary | ICD-10-CM

## 2023-07-28 DIAGNOSIS — Z23 Encounter for immunization: Secondary | ICD-10-CM | POA: Diagnosis not present

## 2023-07-29 NOTE — Progress Notes (Signed)
 [facility] at [facility] [address] [place], [place] [address] [phone]  Date:  07/30/2023   Name:  Destiny Ballard   DOB:  [birth year]   MRN:  [id number]  PCP:  Kaylee Partridge, MD    Chief Complaint: Annual Exam (Concerns/ questions: 1. Memory, slurred speech, gait issues since December. 2. ear pain- bilateral. /Mamm: declines/Flu shot today: yesterday 07/29/23/Urine, A1C due)   History of Present Illness:  Destiny Ballard is a 73 y.o. very pleasant female patient who presents with the following:  Pt seen today for a CPE/ Medicare-  history of CAD (stents 2007, most recent cath 2019 negative) with cardiology care, hypertension, CHF, sleep apnea, COPD, diabetes, GERD, hyperlipidemia  Diabetes has been well-controlled-recently she has been using a continuous blood glucose monitor, freestyle libre 3 which is aiding her in monitoring her blood sugar  Last seen by myself in July  Visit with cardiology in May: Coronary disease stable from that point review on antiplatelet therapy without angina pectoris.  I encouraged her to BL be more active which will be beneficial for her physical wellbeing psychological wellbeing as well as for diabetes. Diastolic congestive heart failure.  Seems to be stable from that point to an appropriate medications. Dyslipidemia I did review K PN which show me LDL 45 HDL 46 cholesterol 295 this is from March 2024 we will continue present management    Lab Results  Component Value Date   HGBA1C 8.4 (H) 07/30/2023   Mammo- she declines Flu- done Covid booster recommended Needs urine micro-we will update today  Pt notes some difficulty with speech for the last 5-6 weeks; it is difficult for her to describe exactly what she means.  I am not certain if she is having difficulty pronouncing and forming words or thinking of the words she wants to say.  It may be a little bit of both.  She is able to chew  and swallow normally She notes she has been "losing my balance" and falling or nearly falling- this has been present for 5-6 weeks as well She fell yesterday- but she did not hit her head.  On further questioning she states this is actually the only time she fell and it was due to slipping on ice outdoors She has also noted difficulty with her memory-no specific details, but feels like she is having a harder time remembering things over the next several weeks She notes occasional severe headache that may come up suddenly and last 5 minutes No distinct weakness or numbness of any part of her body  "I think I have ear infection in both ears because both of them hurt"  She has not noted any hypoglycemia symptoms or low blood sugar readings. Pt notes she ran out of trulicity  several months ago and just stopped taking it -she is not sure if she needs to restart this medication She is on metformin , farxiga  and insulin  30 units currently  Patient Active Problem List   Diagnosis Date Noted   Diabetes mellitus (HCC) 03/01/2022   Hypoxia 11/27/2021   Osteopenia 06/27/2021   Melena 12/18/2020   History of colonic polyps 12/18/2020   Constipation 12/18/2020   Obesity    Iron deficiency anemia    Insulin  resistance    Hyperlipidemia    Cervical spondylosis    Anxiety    Pneumonia due to COVID-19 virus 08/21/2020   Hyperlipidemia associated with type 2 diabetes mellitus (HCC)  08/21/2020   Type 2 diabetes mellitus with diabetic polyneuropathy, with long-term current use of insulin  (HCC) 12/10/2019   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   Tinnitus of both ears 11/18/2019   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   Eustachian tube dysfunction, bilateral 11/18/2019   Chronic eczematous otitis externa of both ears 11/18/2019   Obesity (BMI 30-39.9) 08/24/2018   Depression with anxiety 08/24/2018   CAD (coronary artery disease) 08/24/2018   Abnormal  cardiovascular stress test 08/24/2018   Precordial chest pain    Sepsis (HCC) 09/18/2017   Chronic diastolic CHF (congestive heart failure) (HCC) 09/18/2017   Chronic rhinitis 08/07/2016   COPD with acute exacerbation (HCC) 04/25/2016   Hypertension associated with diabetes (HCC) 01/30/2016   Diarrhea 09/26/2015   OSA (obstructive sleep apnea) 05/06/2014   Dyspnea on exertion 09/02/2012   GERD 06/28/2008    Past Medical History:  Diagnosis Date   Anxiety    Prior suicide attempt   CAD (coronary artery disease)    a) s/p DES to LAD 07/2005 b) Last Myoview  low risk 11/2011 showing small fixed apical perfusion defect (prior MI vs attenuation) but no ischemia - normal EF.   Cervical spondylosis    Chest pain 12/10/2011   Chronic diastolic CHF (congestive heart failure) (HCC) 09/18/2017   Chronic eczematous otitis externa of both ears 11/18/2019   Chronic rhinitis 08/07/2016   Complication of anesthesia    developed pneumonia after nerve block was hospitalized   Constipation 12/18/2020   COPD with acute exacerbation (HCC) 04/25/2016   Coronary atherosclerosis 06/28/2008   Depression    Depression with anxiety    Diabetes mellitus (HCC) 09/08/2017   Diabetic neuropathy (HCC)    Dyspnea on exertion 09/02/2012   CXR 07/2012:  No acute process.     Eustachian tube dysfunction, bilateral 11/18/2019   GERD 06/28/2008   Hearing loss    Left   History of colonic polyps 12/18/2020   History of kidney stones    HLD (hyperlipidemia)    Hyperlipidemia associated with type 2 diabetes mellitus (HCC) 08/21/2020   Hypertension    Hypertension associated with diabetes (HCC) 01/30/2016   Hypokalemia 01/27/2016   Insulin  resistance    Iron deficiency anemia    Leukocytosis    history of mild leukocytosis   Lobar pneumonia, unspecified organism (HCC) 11/13/2017   Melena 12/18/2020   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   Obesity (BMI 30-39.9) 08/24/2018    Last Assessment & Plan:  Exercise and weight reduction is encouraged.   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 EF 60%   CARDIAC CATHETERIZATION N/A 01/29/2016   Procedure: Left Heart Cath and Coronary Angiography;  Surgeon: Arnoldo Lapping, MD;  Location: [facility] INVASIVE CV LAB;  Service: Cardiovascular;  Laterality: N/A;   CERVICAL SPONDYLOSIS     SINGLE LEVEL FUSION   CHILDBIRTH     X3  COLONOSCOPY  2010   normal   COLONOSCOPY WITH PROPOFOL   10/01/2021   CORONARY STENT PLACEMENT  07/15/2005   LEFT ANTERIOR DESCENDING   FOREARM FRACTURE SURGERY  07/15/2008   hand and shoulder    INCISION AND DRAINAGE BREAST ABSCESS  01/05/2012       INCISION AND DRAINAGE PERIRECTAL ABSCESS N/A 02/18/2014   Procedure: IRRIGATION AND DEBRIDEMENT PERIRECTAL ABSCESS;  Surgeon: Azucena Bollard, MD;  Location: [facility] ORS;  Service: General;  Laterality: N/A;   LEFT HEART CATH AND CORONARY ANGIOGRAPHY N/A 10/10/2017   Procedure: LEFT HEART CATH AND CORONARY ANGIOGRAPHY;  Surgeon: Odie Benne, MD;  Location: [facility] INVASIVE CV LAB;  Service: Cardiovascular;  Laterality: N/A;   LUMBAR LAMINECTOMY     ORIF WRIST FRACTURE Right 11/27/2021   Procedure: OPEN REDUCTION INTERNAL FIXATION (ORIF) WRIST FRACTURE;  Surgeon: Osa Blase, MD;  Location: [facility] OR[facility];  Service: Orthopedics;  Laterality: Right;   ROTATOR CUFF REPAIR     bilaterla   TONSILLECTOMY AND  ADENOIDECTOMY     TUBAL LIGATION     VIDEO BRONCHOSCOPY Bilateral 08/13/2016   Procedure: VIDEO BRONCHOSCOPY WITHOUT FLUORO;  Surgeon: Denson Flake, MD;  Location: [facility] ENDOSCOPY;  Service: Cardiopulmonary;  Laterality: Bilateral;    Social History   Tobacco Use   Smoking status: Never    Passive exposure: Yes   Smokeless tobacco: Never  Vaping Use   Vaping status: Never Used  Substance Use Topics   Alcohol  use: Yes    Comment: occ   Drug use: No    Family History  Problem Relation Age of Onset   Heart attack Mother    Diabetes Mother    Lung cancer Mother    Asthma Mother    Heart disease Mother    Suicidality Father        "killed himself"   Diabetes Sister    Cancer Sister    Colon cancer Sister 27   Asthma Daughter        x 2   Cancer Daughter        pre-cancerous polyp   Cervical cancer Daughter        cervical    Allergies Other        all family--seasonal allergies    Allergies  Allergen Reactions   Prednisone  Other (See Comments)    REACTION: mood swings, nightmares. "Shot doesn't bother me, reaction is just with the pill" she states she has had the steroid injections before. From our records methylprednisone was given to her in 2013 without any complications.    Medication list has been reviewed and updated.  Current Outpatient Medications on File Prior to Visit  Medication Sig Dispense Refill   albuterol  (VENTOLIN  HFA) 108 (90 Base) MCG/ACT inhaler Inhale 2 puffs into the lungs every 8 (eight) hours as needed for wheezing or shortness of breath. 18 g 5   aspirin  EC 81 MG tablet Take 81 mg by mouth daily.     calcium  carbonate (TUMS EX) 750 MG chewable tablet Chew 1 tablet by mouth daily as needed for heartburn.     Cholecalciferol  (VITAMIN D3) 1000 UNITS CAPS Take 1,000 Units by mouth daily.     Continuous Glucose Receiver (FREESTYLE LIBRE 3 READER) DEVI by Does not apply route. Free style brand Use to check blood glucose continuously. Change sensor  every 10 day as of 11/18/22     Continuous Glucose Sensor (FREESTYLE LIBRE 3 SENSOR) MISC by Does not apply route. Free style brand  Use to check blood glucose continuously. Change sensor every 10 day as of 11/18/22     dapagliflozin  propanediol (FARXIGA ) 10 MG TABS tablet Take 1 tablet (10 mg total) by mouth daily. 90 tablet 3   Dulaglutide  (TRULICITY ) 4.5 MG/0.5ML SOPN INJECT 4.5 MG AS DIRECTED ONCE A WEEK. 4.5 mL 1   famotidine  (PEPCID ) 40 MG tablet Take 1 tablet (40 mg total) by mouth 2 (two) times daily. 60 tablet 6   fexofenadine (ALLEGRA) 180 MG tablet Take 180 mg by mouth daily.     FLUoxetine  (PROZAC ) 40 MG capsule TAKE 1 CAPSULE (40 MG TOTAL) BY MOUTH DAILY. 90 capsule 1   furosemide  (LASIX ) 20 MG tablet Take 1 tablet (20 mg total) by mouth daily. For swelling / fluid retention 90 tablet 1   gabapentin  (NEURONTIN ) 300 MG capsule Take 2 capsules (600 mg total) by mouth 2 (two) times daily. 360 capsule 1   hydrALAZINE  (APRESOLINE ) 25 MG tablet TAKE 2 TABLETS BY MOUTH 2 TIMES DAILY. FOR BLOOD PRESSURE (Patient taking differently: Take 25 mg by mouth in the morning and at bedtime.) 360 tablet 1   Insulin  Glargine (BASAGLAR  KWIKPEN) 100 UNIT/ML Inject 30 Units into the skin daily. Profile until patient requests 15 mL 1   Insulin  Pen Needle 32G X 4 MM MISC 1 Device by Does not apply route daily. 100 each 3   Lancets (FREESTYLE) lancets Use to check blood glucose once a day (Patient taking differently: 1 each by Other route as needed for other (see below). Use to check blood glucose once a day) 100 each 5   metFORMIN  (GLUCOPHAGE -XR) 500 MG 24 hr tablet TAKE 2 TABLETS BY MOUTH TWICE A DAY 360 tablet 3   metoprolol  succinate (TOPROL -XL) 100 MG 24 hr tablet TAKE 1 TABLET BY MOUTH EVERY DAY 90 tablet 1   montelukast  (SINGULAIR ) 10 MG tablet Take 1 tablet (10 mg total) by mouth at bedtime. 90 tablet 1   nitroGLYCERIN  (NITROSTAT ) 0.4 MG SL tablet Place 0.4 mg under the tongue every 5 (five) minutes as needed  for chest pain.     OVER THE COUNTER MEDICATION Take 1 each by mouth daily. CBD gummy     pantoprazole  (PROTONIX ) 40 MG tablet TAKE 1 TABLET BY MOUTH TWICE A DAY 180 tablet 1   rosuvastatin  (CRESTOR ) 20 MG tablet TAKE 1 TABLET BY MOUTH DAILY. FOR CHOLESTEROL / HEART (Patient taking differently: Take 20 mg by mouth daily.) 90 tablet 3   Tiotropium Bromide  Monohydrate (SPIRIVA  RESPIMAT) 2.5 MCG/ACT AERS INHALE 2 PUFFS BY MOUTH INTO THE LUNGS DAILY (Patient taking differently: Inhale 2 each into the lungs daily.) 4 g 5   valsartan  (DIOVAN ) 160 MG tablet TAKE 1 TABLET BY MOUTH EVERY DAY 90 tablet 1   zinc  gluconate 50 MG tablet Take 50 mg by mouth daily.     No current facility-administered medications on file prior to visit.    Review of Systems:  As per HPI- otherwise negative.   Physical Examination: Vitals:   07/30/23 0854 07/30/23 0919  BP: (!) 152/80 (!) 142/80  Pulse: 87   Resp: 18   Temp: 97.6 F (36.4 C)   SpO2: 97%    Vitals:   07/30/23 0854  Weight: 168 lb 6.4 oz (76.4 kg)  Height: 4\' 11"  (1.499 m)   Body mass index is 34.01 kg/m. Ideal Body Weight: Weight in (lb) to have BMI = 25: 123.5  GEN: no acute distress.  Mildly obese, looks well HEENT: Atraumatic, Normocephalic. Bilateral  TM wnl, oropharynx normal.  PEERL,EOMI.   Ears and Nose: No external deformity. CV: RRR, No M/G/R. No JVD. No thrill. No extra heart sounds. PULM: CTA B, no wheezes, crackles, rhonchi. No retractions. No resp. distress. No accessory muscle use. ABD: S, NT, ND, +BS. No rebound. No HSM. EXTR: No c/c/e PSYCH: Normally interactive. Conversant.  Patient wobbles significantly with Romberg testing but does not step out.  Gait is somewhat slow and mildly shuffling in character.  Mild dyssynchrony and rapidly alternating hand movements.  Normal strength, sensation, deep tendon reflex of all limbs.  Normal strength and sensation of facial muscles  BP Readings from Last 3 Encounters:  07/30/23 (!)  142/80  02/03/23 122/60  11/18/22 130/66    Assessment and Plan: COPD without exacerbation (HCC)  Hypertension associated with diabetes (HCC) - Plan: CBC, Comprehensive metabolic panel  Coronary artery disease involving native heart with other form of angina pectoris, unspecified vessel or lesion type (HCC) - Plan: Lipid panel  Type 2 diabetes mellitus with other circulatory complication, with long-term current use of insulin  (HCC) - Plan: Hemoglobin A1c, Microalbumin / creatinine urine ratio  Hyperlipidemia, unspecified hyperlipidemia type - Plan: Lipid panel  Thyroid  disorder screening - Plan: TSH  Gait instability - Plan: B12, MR Brain Wo Contrast  Memory loss - Plan: TSH, B12, RPR, MR Brain Wo Contrast  Primary thunderclap headache - Plan: MR Brain Wo Contrast  Neurological deficit present - Plan: MR Brain Wo Contrast  Slurred speech - Plan: MR Brain Wo Contrast  Balance disorder - Plan: MR Brain Wo Contrast  Other vascular headache  Physical exam today for Medicare patient- encouraged healthy diet and exercise routine Will plan further follow- up pending labs. Will obtain an MRI of her brain due to possible CVA symptoms over the last several weeks.  MR angio brain cannot be ordered at this time as I cannot locate a diagnosis code to satisfy Medicare requirements and thus cannot sign the order  Signed Gates Kasal, MD  Addendum 1/16, received labs as below.  Gave her a call but no answer.  LMOM, mychart to pt  Results for orders placed or performed in visit on 07/30/23  CBC   Collection Time: 07/30/23  9:27 AM  Result Value Ref Range   WBC 6.1 4.0 - 10.5 K/uL   RBC 4.86 3.87 - 5.11 Mil/uL   Platelets 257.0 150.0 - 400.0 K/uL   Hemoglobin 12.5 12.0 - 15.0 g/dL   HCT 16.1 09.6 - 04.5 %   MCV 81.6 78.0 - 100.0 fl   MCHC 31.4 30.0 - 36.0 g/dL   RDW 40.9 81.1 - 91.4 %  Comprehensive metabolic panel   Collection Time: 07/30/23  9:27 AM  Result Value Ref Range    Sodium 143 135 - 145 mEq/L   Potassium 3.7 3.5 - 5.1 mEq/L   Chloride 102 96 - 112 mEq/L   CO2 31 19 - 32 mEq/L   Glucose, Bld 152 (H) 70 - 99 mg/dL   BUN 12 6 - 23 mg/dL   Creatinine, Ser 7.82 0.40 - 1.20 mg/dL   Total Bilirubin 0.4 0.2 - 1.2 mg/dL   Alkaline Phosphatase 96 39 - 117 U/L   AST 16 0 - 37 U/L   ALT 17 0 - 35 U/L   Total Protein 7.0 6.0 - 8.3 g/dL   Albumin 4.0 3.5 - 5.2 g/dL   GFR 95.62 >13.08 mL/min   Calcium  9.3 8.4 - 10.5 mg/dL  Hemoglobin M5H  Collection Time: 07/30/23  9:27 AM  Result Value Ref Range   Hgb A1c MFr Bld 8.4 (H) 4.6 - 6.5 %  Lipid panel   Collection Time: 07/30/23  9:27 AM  Result Value Ref Range   Cholesterol 261 (H) 0 - 200 mg/dL   Triglycerides 098.1 (H) 0.0 - 149.0 mg/dL   HDL 19.14 >78.29 mg/dL   VLDL 56.2 (H) 0.0 - 13.0 mg/dL   LDL Cholesterol 865 (H) 0 - 99 mg/dL   Total CHOL/HDL Ratio 5    NonHDL 210.11   TSH   Collection Time: 07/30/23  9:27 AM  Result Value Ref Range   TSH 0.94 0.35 - 5.50 uIU/mL  B12   Collection Time: 07/30/23  9:27 AM  Result Value Ref Range   Vitamin B-12 153 (L) 211 - 911 pg/mL  Microalbumin / creatinine urine ratio   Collection Time: 07/30/23  9:27 AM  Result Value Ref Range   Microalb, Ur 7.4 (H) 0.0 - 1.9 mg/dL   Creatinine,U 78.4 mg/dL   Microalb Creat Ratio 15.6 0.0 - 30.0 mg/g   *Note: Due to a large number of results and/or encounters for the requested time period, some results have not been displayed. A complete set of results can be found in Results Review.

## 2023-07-30 ENCOUNTER — Encounter: Payer: Self-pay | Admitting: Family Medicine

## 2023-07-30 ENCOUNTER — Ambulatory Visit (INDEPENDENT_AMBULATORY_CARE_PROVIDER_SITE_OTHER): Payer: Medicare Other | Admitting: Family Medicine

## 2023-07-30 VITALS — BP 142/80 | HR 87 | Temp 97.6°F | Resp 18 | Ht 59.0 in | Wt 168.4 lb

## 2023-07-30 DIAGNOSIS — R2681 Unsteadiness on feet: Secondary | ICD-10-CM | POA: Diagnosis not present

## 2023-07-30 DIAGNOSIS — I25118 Atherosclerotic heart disease of native coronary artery with other forms of angina pectoris: Secondary | ICD-10-CM

## 2023-07-30 DIAGNOSIS — Z794 Long term (current) use of insulin: Secondary | ICD-10-CM | POA: Diagnosis not present

## 2023-07-30 DIAGNOSIS — R29818 Other symptoms and signs involving the nervous system: Secondary | ICD-10-CM

## 2023-07-30 DIAGNOSIS — E785 Hyperlipidemia, unspecified: Secondary | ICD-10-CM | POA: Diagnosis not present

## 2023-07-30 DIAGNOSIS — R4781 Slurred speech: Secondary | ICD-10-CM

## 2023-07-30 DIAGNOSIS — J449 Chronic obstructive pulmonary disease, unspecified: Secondary | ICD-10-CM

## 2023-07-30 DIAGNOSIS — R2689 Other abnormalities of gait and mobility: Secondary | ICD-10-CM | POA: Diagnosis not present

## 2023-07-30 DIAGNOSIS — Z1329 Encounter for screening for other suspected endocrine disorder: Secondary | ICD-10-CM

## 2023-07-30 DIAGNOSIS — G441 Vascular headache, not elsewhere classified: Secondary | ICD-10-CM | POA: Diagnosis not present

## 2023-07-30 DIAGNOSIS — R413 Other amnesia: Secondary | ICD-10-CM

## 2023-07-30 DIAGNOSIS — G4453 Primary thunderclap headache: Secondary | ICD-10-CM

## 2023-07-30 DIAGNOSIS — I152 Hypertension secondary to endocrine disorders: Secondary | ICD-10-CM

## 2023-07-30 DIAGNOSIS — E1159 Type 2 diabetes mellitus with other circulatory complications: Secondary | ICD-10-CM | POA: Diagnosis not present

## 2023-07-30 LAB — CBC
HCT: 39.7 % (ref 36.0–46.0)
Hemoglobin: 12.5 g/dL (ref 12.0–15.0)
MCHC: 31.4 g/dL (ref 30.0–36.0)
MCV: 81.6 fL (ref 78.0–100.0)
Platelets: 257 10*3/uL (ref 150.0–400.0)
RBC: 4.86 Mil/uL (ref 3.87–5.11)
RDW: 15.2 % (ref 11.5–15.5)
WBC: 6.1 10*3/uL (ref 4.0–10.5)

## 2023-07-30 LAB — COMPREHENSIVE METABOLIC PANEL
ALT: 17 U/L (ref 0–35)
AST: 16 U/L (ref 0–37)
Albumin: 4 g/dL (ref 3.5–5.2)
Alkaline Phosphatase: 96 U/L (ref 39–117)
BUN: 12 mg/dL (ref 6–23)
CO2: 31 meq/L (ref 19–32)
Calcium: 9.3 mg/dL (ref 8.4–10.5)
Chloride: 102 meq/L (ref 96–112)
Creatinine, Ser: 0.55 mg/dL (ref 0.40–1.20)
GFR: 90.45 mL/min (ref >60.00)
Glucose, Bld: 152 mg/dL — ABNORMAL HIGH (ref 70–99)
Potassium: 3.7 meq/L (ref 3.5–5.1)
Sodium: 143 meq/L (ref 135–145)
Total Bilirubin: 0.4 mg/dL (ref 0.2–1.2)
Total Protein: 7 g/dL (ref 6.0–8.3)

## 2023-07-30 LAB — LIPID PANEL
Cholesterol: 261 mg/dL — ABNORMAL HIGH (ref 0–200)
HDL: 51.3 mg/dL (ref >39.00)
LDL Cholesterol: 135 mg/dL — ABNORMAL HIGH (ref 0–99)
NonHDL: 210.11
Total CHOL/HDL Ratio: 5
Triglycerides: 377 mg/dL — ABNORMAL HIGH (ref 0.0–149.0)
VLDL: 75.4 mg/dL — ABNORMAL HIGH (ref 0.0–40.0)

## 2023-07-30 LAB — VITAMIN B12: Vitamin B-12: 153 pg/mL — ABNORMAL LOW (ref 211–911)

## 2023-07-30 LAB — MICROALBUMIN / CREATININE URINE RATIO
Creatinine,U: 47.3 mg/dL
Microalb Creat Ratio: 15.6 mg/g (ref 0.0–30.0)
Microalb, Ur: 7.4 mg/dL — ABNORMAL HIGH (ref 0.0–1.9)

## 2023-07-30 LAB — HEMOGLOBIN A1C: Hgb A1c MFr Bld: 8.4 % — ABNORMAL HIGH (ref 4.6–6.5)

## 2023-07-30 LAB — TSH: TSH: 0.94 u[IU]/mL (ref 0.35–5.50)

## 2023-07-30 NOTE — Patient Instructions (Addendum)
 It was good to see you again today, I will be in touch with your lab work as soon as possible.  I will look for any explanation for your symptoms and your labs, and we will also set up an MRI of your brain ASAP-I am a bit concerned some of your recent symptoms could be sign of a mild stroke.  An MRI will help us  answer this question  Please let me know if anything is changing or getting worse in the meantime!    Please try some "sweet oil" drops for your ears itching   We will see how your A1c looks- if needed I can get you back on Trulicity 

## 2023-07-31 ENCOUNTER — Encounter: Payer: Self-pay | Admitting: Family Medicine

## 2023-07-31 LAB — RPR: RPR Ser Ql: NONREACTIVE

## 2023-08-04 ENCOUNTER — Ambulatory Visit (HOSPITAL_COMMUNITY)
Admission: RE | Admit: 2023-08-04 | Discharge: 2023-08-04 | Disposition: A | Discharge status: Home / Self Care | Payer: Medicare Other | Source: Ambulatory Visit | Attending: Family Medicine | Admitting: Family Medicine

## 2023-08-04 DIAGNOSIS — G4453 Primary thunderclap headache: Secondary | ICD-10-CM | POA: Diagnosis not present

## 2023-08-04 DIAGNOSIS — R29818 Other symptoms and signs involving the nervous system: Secondary | ICD-10-CM | POA: Insufficient documentation

## 2023-08-04 DIAGNOSIS — R479 Unspecified speech disturbances: Secondary | ICD-10-CM | POA: Diagnosis not present

## 2023-08-04 DIAGNOSIS — R4781 Slurred speech: Secondary | ICD-10-CM | POA: Diagnosis not present

## 2023-08-04 DIAGNOSIS — R2689 Other abnormalities of gait and mobility: Secondary | ICD-10-CM | POA: Insufficient documentation

## 2023-08-04 DIAGNOSIS — G9389 Other specified disorders of brain: Secondary | ICD-10-CM | POA: Diagnosis not present

## 2023-08-04 DIAGNOSIS — R9089 Other abnormal findings on diagnostic imaging of central nervous system: Secondary | ICD-10-CM | POA: Diagnosis not present

## 2023-08-04 DIAGNOSIS — R2681 Unsteadiness on feet: Secondary | ICD-10-CM | POA: Diagnosis not present

## 2023-08-04 DIAGNOSIS — R413 Other amnesia: Secondary | ICD-10-CM | POA: Diagnosis not present

## 2023-08-04 DIAGNOSIS — Z8673 Personal history of transient ischemic attack (TIA), and cerebral infarction without residual deficits: Secondary | ICD-10-CM | POA: Diagnosis not present

## 2023-08-05 NOTE — Addendum Note (Signed)
Addended by: Abbe Amsterdam C on: 08/05/2023 05:01 PM   Modules accepted: Level of Service

## 2023-08-06 ENCOUNTER — Ambulatory Visit: Payer: Medicare Other | Admitting: Family Medicine

## 2023-08-06 NOTE — Care Plan (Signed)
Updated.

## 2023-08-12 ENCOUNTER — Encounter: Payer: Self-pay | Admitting: Family Medicine

## 2023-08-12 DIAGNOSIS — G4453 Primary thunderclap headache: Secondary | ICD-10-CM

## 2023-08-12 DIAGNOSIS — R29818 Other symptoms and signs involving the nervous system: Secondary | ICD-10-CM

## 2023-08-12 DIAGNOSIS — R413 Other amnesia: Secondary | ICD-10-CM

## 2023-08-12 DIAGNOSIS — R2681 Unsteadiness on feet: Secondary | ICD-10-CM

## 2023-08-12 DIAGNOSIS — R2689 Other abnormalities of gait and mobility: Secondary | ICD-10-CM

## 2023-08-12 DIAGNOSIS — R4781 Slurred speech: Secondary | ICD-10-CM

## 2023-08-26 ENCOUNTER — Other Ambulatory Visit: Payer: Self-pay | Admitting: Family Medicine

## 2023-08-26 DIAGNOSIS — Z794 Long term (current) use of insulin: Secondary | ICD-10-CM

## 2023-09-01 ENCOUNTER — Other Ambulatory Visit: Payer: Self-pay | Admitting: Family Medicine

## 2023-09-02 ENCOUNTER — Encounter: Payer: Self-pay | Admitting: Diagnostic Neuroimaging

## 2023-09-02 ENCOUNTER — Ambulatory Visit (INDEPENDENT_AMBULATORY_CARE_PROVIDER_SITE_OTHER): Payer: Medicare Other | Admitting: Diagnostic Neuroimaging

## 2023-09-02 VITALS — BP 160/75 | HR 71 | Ht 59.0 in | Wt 169.8 lb

## 2023-09-02 DIAGNOSIS — R4789 Other speech disturbances: Secondary | ICD-10-CM

## 2023-09-02 DIAGNOSIS — R269 Unspecified abnormalities of gait and mobility: Secondary | ICD-10-CM

## 2023-09-02 NOTE — Patient Instructions (Addendum)
  MILD WORD FINDING DIFFICULTY - could be related to mild chronic small vessel ischemic disease, depression, B12 deficiency - treat hypertension, diabetes, hypercholesterolemia per PCP - try to stay active physically and get some exercise (at least 15-30 minutes per day) - eat a nutritious diet with lean protein, plants / vegetables, whole grains; avoid ultra-processed foods - increase social activities, brain stimulation, games, puzzles, hobbies, crafts, arts, music; try new activities; keep it fun! - aim for at least 7-8 hours sleep per night (or more) - avoid smoking and alcohol - caution with medications, finances, driving - safety / supervision issues reviewed  BALANCE ISSUES - due to B12 deficiency and diabetic neuropathy - consider PT evaluation

## 2023-09-02 NOTE — Progress Notes (Signed)
 [facility]  PATIENT: Destiny Ballard DOB: 1951/05/06  REFERRING CLINICIAN: Copland, Gwenlyn Found, MD HISTORY FROM: patient  REASON FOR VISIT: new consult   HISTORICAL  CHIEF COMPLAINT:  Chief Complaint  Patient presents with   New Patient (Initial Visit)    Pt in room 6. 2 daughters in room. Internal referral for stroke like symptoms, advanced volume loss on MRI. Pt reports she having slurred speech, balance loss, falls. Last fall was last month. Pt reports ringing in ears for years now. Pt reports no dizziness.    HISTORY OF PRESENT ILLNESS:   73 year old female here for evaluation of gait and balance difficulty, word finding difficulties, since 09/23/22.  Has not passed away about 6 or 7 years ago, and she has had some depression since that time.  She has a sedentary lifestyle.  She tends to eat a lot of junk food.  Her daughters have been trying to encourage her to become more active but patient has not changed that much.  Was found to have low B12 level and now is on supplement.  Had MRI of the brain showing some atrophy and small vessel disease changes.   REVIEW OF SYSTEMS: Full 14 system review of systems performed and negative with exception of: as per HPI.  ALLERGIES: Allergies  Allergen Reactions   Prednisone Other (See Comments)    REACTION: mood swings, nightmares. "Shot doesn't bother me, reaction is just with the pill" she states she has had the steroid injections before. From our records methylprednisone was given to her in 2011/09/24 without any complications.    HOME MEDICATIONS: Outpatient Medications Prior to Visit  Medication Sig Dispense Refill   albuterol (VENTOLIN HFA) 108 (90 Base) MCG/ACT inhaler Inhale 2 puffs into the lungs every 8 (eight) hours as needed for wheezing or shortness of breath. 18 g 5   aspirin EC 81 MG tablet Take 81 mg by mouth daily.     calcium carbonate (TUMS EX) 750 MG chewable tablet Chew 1 tablet by mouth daily as  needed for heartburn.     Cholecalciferol (VITAMIN D3) 1000 UNITS CAPS Take 1,000 Units by mouth daily.     Continuous Glucose Receiver (FREESTYLE LIBRE 3 READER) DEVI by Does not apply route. Free style brand Use to check blood glucose continuously. Change sensor every 10 day as of 11/18/22     Continuous Glucose Sensor (FREESTYLE LIBRE 3 SENSOR) MISC by Does not apply route. Free style brand Use to check blood glucose continuously. Change sensor every 10 day as of 11/18/22     dapagliflozin propanediol (FARXIGA) 10 MG TABS tablet Take 1 tablet (10 mg total) by mouth daily. 90 tablet 1   famotidine (PEPCID) 40 MG tablet Take 1 tablet (40 mg total) by mouth 2 (two) times daily. 60 tablet 6   fexofenadine (ALLEGRA) 180 MG tablet Take 180 mg by mouth daily.     FLUoxetine (PROZAC) 40 MG capsule TAKE 1 CAPSULE (40 MG TOTAL) BY MOUTH DAILY. 90 capsule 1   furosemide (LASIX) 20 MG tablet Take 1 tablet (20 mg total) by mouth daily. For swelling / fluid retention 90 tablet 1   gabapentin (NEURONTIN) 300 MG capsule Take 2 capsules (600 mg total) by mouth 2 (two) times daily. 360 capsule 1   hydrALAZINE (APRESOLINE) 25 MG tablet TAKE 2 TABLETS BY MOUTH 2 TIMES DAILY. FOR BLOOD PRESSURE (Patient taking differently: Take 25 mg by mouth in the morning and at bedtime.) 360 tablet 1  Insulin Glargine (BASAGLAR KWIKPEN) 100 UNIT/ML Inject 30 Units into the skin daily. Profile until patient requests 15 mL 1   Insulin Pen Needle 32G X 4 MM MISC 1 Device by Does not apply route daily. 100 each 3   Lancets (FREESTYLE) lancets Use to check blood glucose once a day (Patient taking differently: 1 each by Other route as needed for other (see below). Use to check blood glucose once a day) 100 each 5   metFORMIN (GLUCOPHAGE-XR) 500 MG 24 hr tablet TAKE 2 TABLETS BY MOUTH TWICE A DAY 360 tablet 3   metoprolol succinate (TOPROL-XL) 100 MG 24 hr tablet Take 1 tablet (100 mg total) by mouth daily. 90 tablet 1   montelukast  (SINGULAIR) 10 MG tablet Take 1 tablet (10 mg total) by mouth at bedtime. 90 tablet 1   nitroGLYCERIN (NITROSTAT) 0.4 MG SL tablet Place 0.4 mg under the tongue every 5 (five) minutes as needed for chest pain.     OVER THE COUNTER MEDICATION Take 1 each by mouth daily. CBD gummy     pantoprazole (PROTONIX) 40 MG tablet TAKE 1 TABLET BY MOUTH TWICE A DAY 180 tablet 1   rosuvastatin (CRESTOR) 20 MG tablet TAKE 1 TABLET BY MOUTH DAILY. FOR CHOLESTEROL / HEART (Patient taking differently: Take 20 mg by mouth daily.) 90 tablet 3   Tiotropium Bromide Monohydrate (SPIRIVA RESPIMAT) 2.5 MCG/ACT AERS INHALE 2 PUFFS BY MOUTH INTO THE LUNGS DAILY (Patient taking differently: Inhale 2 each into the lungs daily.) 4 g 5   valsartan (DIOVAN) 160 MG tablet TAKE 1 TABLET BY MOUTH EVERY DAY 90 tablet 1   zinc gluconate 50 MG tablet Take 50 mg by mouth daily.     Dulaglutide (TRULICITY) 4.5 MG/0.5ML SOPN INJECT 4.5 MG AS DIRECTED ONCE A WEEK. (Patient not taking: Reported on 09/02/2023) 4.5 mL 1   No facility-administered medications prior to visit.    PAST MEDICAL HISTORY: Past Medical History:  Diagnosis Date   Anxiety    Prior suicide attempt   CAD (coronary artery disease)    a) s/p DES to LAD 07/2005 b) Last Myoview low risk 11/2011 showing small fixed apical perfusion defect (prior MI vs attenuation) but no ischemia - normal EF.   Cervical spondylosis    Chest pain 12/10/2011   Chronic diastolic CHF (congestive heart failure) (HCC) 09/18/2017   Chronic eczematous otitis externa of both ears 11/18/2019   Chronic rhinitis 08/07/2016   Complication of anesthesia    developed pneumonia after nerve block was hospitalized   Constipation 12/18/2020   COPD with acute exacerbation (HCC) 04/25/2016   Coronary atherosclerosis 06/28/2008   Depression    Depression with anxiety    Diabetes mellitus (HCC) 09/08/2017   Diabetic neuropathy (HCC)    Dyspnea on exertion 09/02/2012   CXR 07/2012:  No acute process.      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 EF 60%   CARDIAC CATHETERIZATION N/A 01/29/2016   Procedure: Left Heart Cath and Coronary Angiography;  Surgeon: Tonny Bollman, MD;  Location: [facility] INVASIVE CV LAB;  Service: Cardiovascular;  Laterality: N/A;   CERVICAL SPONDYLOSIS     SINGLE LEVEL FUSION   CHILDBIRTH     X3   COLONOSCOPY  2010   normal   COLONOSCOPY WITH PROPOFOL  10/01/2021   CORONARY STENT PLACEMENT  07/15/2005   LEFT ANTERIOR DESCENDING   FOREARM FRACTURE SURGERY  07/15/2008   hand and shoulder    INCISION AND DRAINAGE BREAST ABSCESS  01/05/2012       INCISION AND DRAINAGE PERIRECTAL  ABSCESS N/A 02/18/2014   Procedure: IRRIGATION AND DEBRIDEMENT PERIRECTAL ABSCESS;  Surgeon: Valarie Merino, MD;  Location: [facility] ORS;  Service: General;  Laterality: N/A;   LEFT HEART CATH AND CORONARY ANGIOGRAPHY N/A 10/10/2017   Procedure: LEFT HEART CATH AND CORONARY ANGIOGRAPHY;  Surgeon: Kathleene Hazel, MD;  Location: [facility] INVASIVE CV LAB;  Service: Cardiovascular;  Laterality: N/A;   LUMBAR LAMINECTOMY     ORIF WRIST FRACTURE Right 11/27/2021   Procedure: OPEN REDUCTION INTERNAL FIXATION (ORIF) WRIST FRACTURE;  Surgeon: Teryl Lucy, MD;  Location: [facility];  Service: Orthopedics;  Laterality: Right;   ROTATOR CUFF REPAIR     bilaterla   TONSILLECTOMY AND ADENOIDECTOMY     TUBAL LIGATION     VIDEO BRONCHOSCOPY Bilateral 08/13/2016   Procedure: VIDEO BRONCHOSCOPY WITHOUT FLUORO;  Surgeon: Leslye Peer, MD;  Location: [facility];  Service: Cardiopulmonary;  Laterality: Bilateral;    FAMILY HISTORY: Family History  Problem Relation Age of Onset   Heart attack Mother    Diabetes Mother    Lung cancer Mother    Asthma Mother    Heart disease Mother    Suicidality Father        "killed himself"   Diabetes Sister    Cancer Sister    Colon cancer Sister 80   Asthma Daughter        x 2   Cancer Daughter        pre-cancerous polyp   Cervical cancer Daughter        cervical    Allergies Other        all family--seasonal allergies    SOCIAL HISTORY: Social History   Socioeconomic History   Marital status: Widowed    Spouse name: Gerri Spore   Number of children: 3   Years of education: 12   Highest education level: Not on file  Occupational History   Occupation: retired from [employer]    Comment: 11/2012  Tobacco Use   Smoking status: Never    Passive exposure: Yes   Smokeless tobacco: Never  Vaping Use   Vaping status:  Never Used  Substance and Sexual Activity   Alcohol use: Yes    Comment: occ   Drug use: No   Sexual activity: Not Currently    Partners: Male     Birth control/protection: None  Other Topics Concern   Not on file  Social History Narrative   Their eldest daughter lives upstairs.      Pt lives with husband at home. Pt drives.    Social Drivers of Corporate investment banker Strain: Low Risk  (10/25/2022)   Overall Financial Resource Strain (CARDIA)    Difficulty of Paying Living Expenses: Not hard at all  Food Insecurity: No Food Insecurity (10/25/2022)   Hunger Vital Sign    Worried About Running Out of Food in the Last Year: Never true    Ran Out of Food in the Last Year: Never true  Transportation Needs: No Transportation Needs (10/25/2022)   PRAPARE - Administrator, Civil Service (Medical): No    Lack of Transportation (Non-Medical): No  Physical Activity: Inactive (10/25/2022)   Exercise Vital Sign    Days of Exercise per Week: 0 days    Minutes of Exercise per Session: 0 min  Stress: No Stress Concern Present (10/25/2022)   [employer] of Occupational Health - Occupational Stress Questionnaire    Feeling of Stress : Not at all  Social Connections: Moderately Isolated (10/25/2022)   Social Connection and Isolation Panel [NHANES]    Frequency of Communication with Friends and Family: Never    Frequency of Social Gatherings with Friends and Family: Three times a week    Attends Religious Services: 1 to 4 times per year    Active Member of Clubs or Organizations: No    Attends Banker Meetings: Never    Marital Status: Widowed  Intimate Partner Violence: Not At Risk (10/25/2022)   Humiliation, Afraid, Rape, and Kick questionnaire    Fear of Current or Ex-Partner: No    Emotionally Abused: No    Physically Abused: No    Sexually Abused: No     PHYSICAL EXAM  GENERAL EXAM/CONSTITUTIONAL: Vitals:  Vitals:   09/02/23 0844 09/02/23 0855  BP: (!) 173/79 (!) 160/75  Pulse: 71   Weight: 169 lb 12.8 oz (77 kg)   Height: 4\' 11"  (1.499 m)    Body mass index is 34.3 kg/m. Wt Readings from  Last 3 Encounters:  09/02/23 169 lb 12.8 oz (77 kg)  07/30/23 168 lb 6.4 oz (76.4 kg)  02/03/23 164 lb 12.8 oz (74.8 kg)   Patient is in no distress; well developed, nourished and groomed; neck is supple  CARDIOVASCULAR: Examination of carotid arteries is normal; no carotid bruits Regular rate and rhythm, no murmurs Examination of peripheral vascular system by observation and palpation is normal  EYES: Ophthalmoscopic exam of optic discs and posterior segments is normal; no papilledema or hemorrhages No results found.  MUSCULOSKELETAL: Gait, strength, tone, movements noted in Neurologic exam below  NEUROLOGIC: MENTAL STATUS:     09/01/2017    3:13 PM  MMSE - Mini Mental State Exam  Orientation to time 5  Orientation to Place 5  Registration 3  Attention/ Calculation 5  Recall 3  Language- name 2 objects 2  Language- repeat 1  Language- follow 3 step command 3  Language- read & follow direction 1  Write a sentence 1  Copy design 1  Total score 30   awake, alert, oriented to person, place and time recent and  remote memory intact normal attention and concentration language fluent, comprehension intact, naming intact fund of knowledge appropriate  CRANIAL NERVE:  2nd - no papilledema on fundoscopic exam 2nd, 3rd, 4th, 6th - pupils equal and reactive to light, visual fields full to confrontation, extraocular muscles intact, no nystagmus 5th - facial sensation symmetric 7th - facial strength symmetric 8th - hearing intact 9th - palate elevates symmetrically, uvula midline 11th - shoulder shrug symmetric 12th - tongue protrusion midline  MOTOR:  normal bulk and tone, full strength in the BUE, BLE  SENSORY:  normal and symmetric to light touch, temperature, vibration  COORDINATION:  finger-nose-finger, fine finger movements normal  REFLEXES:  deep tendon reflexes TRACE and symmetric  GAIT/STATION:  narrow based gait; SLOW CAREFUL GAIT     DIAGNOSTIC DATA  (LABS, IMAGING, TESTING) - I reviewed patient records, labs, notes, testing and imaging myself where available.  Lab Results  Component Value Date   WBC 6.1 07/30/2023   HGB 12.5 07/30/2023   HCT 39.7 07/30/2023   MCV 81.6 07/30/2023   PLT 257.0 07/30/2023      Component Value Date/Time   NA 143 07/30/2023 0927   NA 143 02/09/2021 1608   K 3.7 07/30/2023 0927   CL 102 07/30/2023 0927   CO2 31 07/30/2023 0927   GLUCOSE 152 (H) 07/30/2023 0927   BUN 12 07/30/2023 0927   BUN 13 02/09/2021 1608   CREATININE 0.55 07/30/2023 0927   CREATININE 0.73 11/16/2019 1006   CREATININE 0.65 03/01/2016 1458   CALCIUM 9.3 07/30/2023 0927   PROT 7.0 07/30/2023 0927   PROT 6.3 02/09/2021 1608   ALBUMIN 4.0 07/30/2023 0927   ALBUMIN 3.9 02/09/2021 1608   AST 16 07/30/2023 0927   AST 14 (L) 11/16/2019 1006   ALT 17 07/30/2023 0927   ALT 14 11/16/2019 1006   ALKPHOS 96 07/30/2023 0927   BILITOT 0.4 07/30/2023 0927   BILITOT <0.2 02/09/2021 1608   BILITOT 0.3 11/16/2019 1006   GFRNONAA >60 11/10/2022 1441   GFRNONAA >60 11/16/2019 1006   GFRAA >60 03/18/2020 0655   GFRAA >60 11/16/2019 1006   Lab Results  Component Value Date   CHOL 261 (H) 07/30/2023   HDL 51.30 07/30/2023   LDLCALC 135 (H) 07/30/2023   LDLDIRECT 51.0 09/18/2022   TRIG 377.0 (H) 07/30/2023   CHOLHDL 5 07/30/2023   Lab Results  Component Value Date   HGBA1C 8.4 (H) 07/30/2023   Lab Results  Component Value Date   VITAMINB12 153 (L) 07/30/2023   Lab Results  Component Value Date   TSH 0.94 07/30/2023    08/04/23 MRI brain [I reviewed images myself and agree with interpretation. Mild ventriculomegaly on ex vacuo basis; mild-mod chronic small vessel ischemic disease. -VRP] 1. No acute intracranial process. No evidence of acute or subacute infarct. 2. Mildly advanced cerebral volume loss for age, with ex vacuo dilatation of the ventricles. No disproportionate lobar atrophy.    ASSESSMENT AND PLAN  73 y.o.  year old female here with:   Dx:  1. Word finding difficulty   2. Gait difficulty     PLAN:  MILD WORD FINDING DIFFICULTY - could be related to mild chronic small vessel ischemic disease, depression, and B12 deficiency - treat hypertension, diabetes, hypercholesterolemia per PCP - try to stay active physically and get some exercise (at least 15-30 minutes per day) - eat a nutritious diet with lean protein, plants / vegetables, whole grains; avoid ultra-processed foods - increase social activities, brain  stimulation, games, puzzles, hobbies, crafts, arts, music; try new activities; keep it fun! - aim for at least 7-8 hours sleep per night (or more) - avoid smoking and alcohol - caution with medications, finances, driving - safety / supervision issues reviewed  BALANCE ISSUES - due to B12 deficiency and diabetic neuropathy - consider PT evaluation  Orders Placed This Encounter  Procedures   Ambulatory referral to Physical Therapy   Return for return to PCP, pending if symptoms worsen or fail to improve.    Suanne Marker, MD 09/02/2023, 9:20 AM Certified in Neurology, Neurophysiology and Neuroimaging  [facility] [address], [address] [place], [place] [address] [phone]

## 2023-09-15 ENCOUNTER — Other Ambulatory Visit: Payer: Self-pay | Admitting: Family Medicine

## 2023-09-15 DIAGNOSIS — E1159 Type 2 diabetes mellitus with other circulatory complications: Secondary | ICD-10-CM

## 2023-09-15 DIAGNOSIS — Z794 Long term (current) use of insulin: Secondary | ICD-10-CM

## 2023-09-18 ENCOUNTER — Other Ambulatory Visit: Payer: Self-pay | Admitting: Family Medicine

## 2023-09-23 ENCOUNTER — Ambulatory Visit: Payer: Medicare Other | Admitting: Physical Therapy

## 2023-09-25 ENCOUNTER — Other Ambulatory Visit: Payer: Self-pay | Admitting: Pharmacist

## 2023-09-25 ENCOUNTER — Encounter: Payer: Self-pay | Admitting: Pharmacist

## 2023-09-25 MED ORDER — TRULICITY 0.75 MG/0.5ML ~~LOC~~ SOAJ: 0.7500 mg | SUBCUTANEOUS | 0 refills | Status: AC

## 2023-09-25 MED ORDER — BASAGLAR KWIKPEN 100 UNIT/ML ~~LOC~~ SOPN: 30.0000 [IU] | PEN_INJECTOR | Freq: Every day | SUBCUTANEOUS | 2 refills | Status: AC

## 2023-09-25 MED ORDER — TRULICITY 1.5 MG/0.5ML ~~LOC~~ SOAJ: 1.5000 mg | SUBCUTANEOUS | 1 refills | Status: DC

## 2023-09-25 NOTE — Progress Notes (Signed)
 09/25/2023 Name: Destiny Ballard MRN: [id number] DOB: [birth year]  Chief Complaint  Patient presents with   Diabetes   Medication Management    Destiny Ballard is a 73 y.o. year old female who presented for a telephone visit.   They were referred to the pharmacist by their PCP for assistance in managing diabetes, hypertension, and complex medication management.    Subjective: Received request from PCP to assist patient with Continuous Glucose Monitor. When I call patient states she need assistance with starting Libre 3 sensor and education on how to use and maintain.  She also had questions about recent MyChart message about urine protein test / UACR.  Lastly Destiny Ballard states she has not been able to get her Mariella Saa though [facility]    Medication Access/Adherence  [id number] [place] [place] - [address] [place] [address] Phone: [phone] Fax: [phone]   Patient reports affordability concerns with their medications: No  Patient reports access/transportation concerns to their pharmacy: No  Patient reports adherence concerns with their medications:  Yes  - not able to refill Basaglar yet, has not been taking Trulicity 4.5mg  in several months - patient is unsure why she stopped.    Diabetes:  Current medications:  Basaglar 30 units once a day Metformin ER 500mg  - take 1000mg  = 2 tabs twice a day Farxiga 10mg  daily  Trulicity 4.5mg  weekly (has not taken in several months)   Current glucose readings: has not been checking regularly. Needs re-education on Liber sensor.       Hypertension / CKD   Current medications: furosemide 20mg  daily (patient taking as needed for edema), hydralazine 25mg  - take 2 tabs = 50mg  twice a day, metoprolol ER 100mg  daily, valsartan 160mg  daily.  Lab Results  Component Value Date   MICROALBUR 7.4 (H) 07/30/2023   MICROALBUR 5.1 (H)  05/20/2022   07/30/2023 urine microalbumin - 7.4 mg/dL; Creatinine was 47.3mg /dL Reported UmACR was 69.6. Actual UACR was 156.49 mg/g  BP Readings from Last 3 Encounters:  09/02/23 (!) 160/75  07/30/23 (!) 142/80  02/03/23 122/60      Objective:  Lab Results  Component Value Date   HGBA1C 8.4 (H) 07/30/2023    Lab Results  Component Value Date   CREATININE 0.55 07/30/2023   BUN 12 07/30/2023   NA 143 07/30/2023   K 3.7 07/30/2023   CL 102 07/30/2023   CO2 31 07/30/2023    Lab Results  Component Value Date   CHOL 261 (H) 07/30/2023   HDL 51.30 07/30/2023   LDLCALC 135 (H) 07/30/2023   LDLDIRECT 51.0 09/18/2022   TRIG 377.0 (H) 07/30/2023   CHOLHDL 5 07/30/2023    Medications Reviewed Today     Reviewed by Destiny Ballard, RPH-CPP (Pharmacist) on 09/25/23 at 1342  Med List Status: <None>   Medication Order Taking? Sig Documenting Provider Last Dose Status Informant  albuterol (VENTOLIN HFA) 108 (90 Base) MCG/ACT inhaler [id number] No Inhale 2 puffs into the lungs every 8 (eight) hours as needed for wheezing or shortness of breath. Copland, Gwenlyn Found, MD Taking Active   aspirin EC 81 MG tablet [id number] No Take 81 mg by mouth daily. [provider] Taking Active Self  calcium carbonate (TUMS EX) 750 MG chewable tablet [id number] No Chew 1 tablet by mouth daily as needed for heartburn. [provider] Taking Active Self  Cholecalciferol (VITAMIN D3) 1000 UNITS CAPS  [id number] No Take 1,000 Units by mouth daily. [provider] Taking Active Self  Continuous Glucose Receiver (FREESTYLE LIBRE 3 READER) DEVI [id number] No by Does not apply route. Free style brand Use to check blood glucose continuously. Change sensor every 10 day as of 11/18/22  Patient not taking: Reported on 09/25/2023   Copland, Gwenlyn Found, MD Not Taking Active   Continuous Glucose Sensor (FREESTYLE LIBRE 3 SENSOR) [place] [id number] No by Does not apply route. Free style brand Use to check  blood glucose continuously. Change sensor every 10 day as of 11/18/22  Patient not taking: Reported on 09/25/2023   Copland, Gwenlyn Found, MD Not Taking Active   dapagliflozin propanediol (FARXIGA) 10 MG TABS tablet [id number] No Take 1 tablet (10 mg total) by mouth daily. Copland, Gwenlyn Found, MD Taking Active   Dulaglutide (TRULICITY) 0.75 MG/0.5ML Ivory Broad [id number] Yes Inject 0.75 mg into the skin once a week. Take for 4 weeks, then increase to 1.5mg  weeky Copland, Gwenlyn Found, MD  Active   Dulaglutide (TRULICITY) 1.5 MG/0.5ML [place] Broad [id number] Yes Inject 1.5 mg into the skin once a week. Copland, Gwenlyn Found, MD  Active   Patient not taking:  Discontinued 09/25/23 1333            Med Note>> Destiny Ballard, RPH-CPP   09/25/2023  1:33 PM dose change    famotidine (PEPCID) 40 MG tablet [id number] No Take 1 tablet (40 mg total) by mouth 2 (two) times daily. Meryl Dare, MD Taking Active   fexofenadine [facility]) 180 MG tablet [id number] No Take 180 mg by mouth daily. [provider] Taking Active Self  FLUoxetine (PROZAC) 40 MG capsule [id number] No TAKE 1 CAPSULE (40 MG TOTAL) BY MOUTH DAILY. Copland, Gwenlyn Found, MD Taking Active   furosemide (LASIX) 20 MG tablet [id number] No Take 1 tablet (20 mg total) by mouth daily. For swelling / fluid retention  Patient taking differently: Take 20 mg by mouth daily as needed for edema. For swelling / fluid retention   Copland, Gwenlyn Found, MD Taking Active   gabapentin (NEURONTIN) 300 MG capsule [id number]  Take 2 capsules (600 mg total) by mouth 2 (two) times daily. Copland, Gwenlyn Found, MD  Active   hydrALAZINE (APRESOLINE) 25 MG tablet [id number] No TAKE 2 TABLETS BY MOUTH 2 TIMES DAILY. FOR BLOOD PRESSURE  Patient taking differently: Take 25 mg by mouth in the morning and at bedtime.   Copland, Gwenlyn Found, MD Taking Active   Insulin Glargine [facility] KWIKPEN) 100 UNIT/ML [id number]  Inject 30 Units into the skin daily. Copland, Gwenlyn Found, MD  Active   Insulin Pen Needle  32G X 4 MM MISC [id number] No 1 Device by Does not apply route daily. Shamleffer, Konrad Dolores, MD Taking Active Self  Lancets (FREESTYLE) lancets [id number] No Use to check blood glucose once a day  Patient taking differently: 1 each by Other route as needed for other (see below). Use to check blood glucose once a day   Copland, Gwenlyn Found, MD Taking Active   metFORMIN (GLUCOPHAGE-XR) 500 MG 24 hr tablet [id number]  Take 2 tablets (1,000 mg total) by mouth 2 (two) times daily. Copland, Gwenlyn Found, MD  Active   metoprolol succinate (TOPROL-XL) 100 MG 24 hr tablet [id number] No Take 1 tablet (100 mg total) by mouth daily. Copland, Gwenlyn Found, MD Taking Active   montelukast (SINGULAIR) 10 MG tablet [id number] No Take 1 tablet (10 mg total) by mouth at bedtime. Copland, Gwenlyn Found, MD Taking Active  nitroGLYCERIN (NITROSTAT) 0.4 MG SL tablet [id number] No Place 0.4 mg under the tongue every 5 (five) minutes as needed for chest pain. [provider] Taking Active   OVER THE COUNTER MEDICATION [id number] No Take 1 each by mouth daily. CBD gummy [provider] Taking Active   pantoprazole (PROTONIX) 40 MG tablet [id number] No TAKE 1 TABLET BY MOUTH TWICE A DAY Unk Lightning, [place] Taking Active   rosuvastatin (CRESTOR) 20 MG tablet [id number] No TAKE 1 TABLET BY MOUTH DAILY. FOR CHOLESTEROL / HEART  Patient taking differently: Take 20 mg by mouth daily.   Copland, Gwenlyn Found, MD Taking Active   Tiotropium Bromide Monohydrate (SPIRIVA RESPIMAT) 2.5 MCG/ACT AERS [id number] No INHALE 2 PUFFS BY MOUTH INTO THE LUNGS DAILY  Patient taking differently: Inhale 2 each into the lungs daily.   Copland, Gwenlyn Found, MD Taking Active            Med Note Clydie Braun, Glenna Durand   Tue Aug 06, 2022  9:31 AM) Not taking every day  valsartan (DIOVAN) 160 MG tablet [id number] No TAKE 1 TABLET BY MOUTH EVERY DAY Copland, Gwenlyn Found, MD Taking Active   zinc gluconate 50 MG tablet [id number] No Take 50 mg by mouth daily.  [provider] Taking Active Self  Med List Note (Copland, Gwenlyn Found, MD 03/12/21 1526): Lorazepam rarely.              Assessment/Plan:   Diabetes: Last A1c was not at goal - suspect related to not taking Trulicity - Patient will come into office 09/29/23 for Continuous Glucose Monitor start and education.  - Reviewed goal A1c, goal fasting, and goal 2 hour post prandial glucose  - Recommend to restart GLP1 - restart Trulicity 0.75mg  weekly for 4 weeks, then increase to 1.5mg  weekly. Will try to work back up to higher dose as tolerate.  - [facility] - [facility] not [facility] form [facility]6 for Basaglar - looks like there was a transmission error. Resent today.   - Continue to take metformin and Faxiga   Hypertension / CKD G1/A2: blood pressure not at goal. Will try to get better control of blood glucose and blood pressure. - Recheck miroalbumin in 3 to 6 months. If not improved consider increasing valsartan if not already at max tolerated dose to control blood pressure.  - In future is UACR not improved could also add [place].  - Recommend to continue current blood pressure meds - will check blood pressure in office March 17th.    Follow Up Plan: 09/29/2023 for education on CGM and blood pressure check   Destiny Ballard, PharmD Clinical Pharmacist [facility] Primary [facility]  Population Health [phone]

## 2023-09-25 NOTE — Progress Notes (Signed)
 [name]

## 2023-09-29 ENCOUNTER — Ambulatory Visit: Admitting: Pharmacist

## 2023-09-29 ENCOUNTER — Other Ambulatory Visit: Payer: Self-pay | Admitting: Pharmacist

## 2023-09-29 VITALS — BP 132/62 | HR 74

## 2023-09-29 DIAGNOSIS — Z794 Long term (current) use of insulin: Secondary | ICD-10-CM | POA: Diagnosis not present

## 2023-09-29 DIAGNOSIS — I152 Hypertension secondary to endocrine disorders: Secondary | ICD-10-CM | POA: Diagnosis not present

## 2023-09-29 DIAGNOSIS — I25118 Atherosclerotic heart disease of native coronary artery with other forms of angina pectoris: Secondary | ICD-10-CM | POA: Diagnosis not present

## 2023-09-29 DIAGNOSIS — Z7985 Long-term (current) use of injectable non-insulin antidiabetic drugs: Secondary | ICD-10-CM | POA: Diagnosis not present

## 2023-09-29 DIAGNOSIS — E1159 Type 2 diabetes mellitus with other circulatory complications: Secondary | ICD-10-CM | POA: Diagnosis not present

## 2023-09-29 MED ORDER — NITROGLYCERIN 0.4 MG SL SUBL: 0.4000 mg | SUBLINGUAL_TABLET | SUBLINGUAL | 1 refills | Status: AC | PRN

## 2023-09-29 NOTE — Progress Notes (Signed)
 09/25/2023 Name: Destiny Ballard MRN: [id number] DOB: [birth year]  Chief Complaint  Patient presents with   Diabetes   Medication Management    Destiny Ballard is a 73 y.o. year old female who presented for an office visit today for Continuous Glucose Monitor instruction   They were referred to the pharmacist by their PCP for assistance in managing diabetes, hypertension, and complex medication management.    Subjective: Received request from PCP to assist patient with Continuous Glucose Monitor.  Medication Access/Adherence  [facility] [address] [place] - [address] [place] [address] Phone: [phone] Fax: [phone]   Patient reports affordability concerns with their medications: No  Patient reports access/transportation concerns to their pharmacy: No  Patient reports adherence concerns with their medications:  Yes  - not able to refill Basaglar yet, has not been taking Trulicity 4.5mg  in several months - patient is unsure why she stopped.    Diabetes:  Current medications:  Basaglar 30 units once a day (has not taken in a few days - has been out and unable to fill per patient) [facility] - pt has not picked up yet Metformin ER 500mg  - take 1000mg  = 2 tabs twice a day Farxiga 10mg  daily  Trulicity 0.75mg  weekly for 4 weeks, then 1.5mg  weekly (plan is to titrate as able / needed)  - has not restarted yet. Rx was filled 09/25/2023  Current glucose readings: has not been checking regularly. Needs re-education on Liber sensor.     Hypertension / CKD   Current medications: furosemide 20mg  daily (patient taking as needed for edema), hydralazine 25mg  - take 2 tabs = 50mg  twice a day, metoprolol ER 100mg  daily, valsartan 160mg  daily.  Lab Results  Component Value Date   MICROALBUR 7.4 (H) 07/30/2023   MICROALBUR 5.1 (H) 05/20/2022   07/30/2023 urine microalbumin - 7.4 mg/dL; Creatinine  was 47.3mg /dL Reported UmACR was 28.4. Actual UACR was 156.49 mg/g  BP Readings from Last 3 Encounters:  09/29/23 132/62  09/02/23 (!) 160/75  07/30/23 (!) 142/80     Objective:  Lab Results  Component Value Date   HGBA1C 8.4 (H) 07/30/2023    Lab Results  Component Value Date   CREATININE 0.55 07/30/2023   BUN 12 07/30/2023   NA 143 07/30/2023   K 3.7 07/30/2023   CL 102 07/30/2023   CO2 31 07/30/2023    Lab Results  Component Value Date   CHOL 261 (H) 07/30/2023   HDL 51.30 07/30/2023   LDLCALC 135 (H) 07/30/2023   LDLDIRECT 51.0 09/18/2022   TRIG 377.0 (H) 07/30/2023   CHOLHDL 5 07/30/2023     Current Outpatient Medications:    albuterol (VENTOLIN HFA) 108 (90 Base) MCG/ACT inhaler, Inhale 2 puffs into the lungs every 8 (eight) hours as needed for wheezing or shortness of breath., Disp: 18 g, Rfl: 5   aspirin EC 81 MG tablet, Take 81 mg by mouth daily., Disp: , Rfl:    calcium carbonate (TUMS EX) 750 MG chewable tablet, Chew 1 tablet by mouth daily as needed for heartburn., Disp: , Rfl:    Cholecalciferol (VITAMIN D3) 1000 UNITS CAPS, Take 1,000 Units by mouth daily., Disp: , Rfl:    dapagliflozin propanediol (FARXIGA) 10 MG TABS tablet, Take 1 tablet (10 mg total) by mouth daily., Disp: 90 tablet, Rfl: 1   famotidine (PEPCID) 40 MG tablet, Take 1 tablet (40 mg total) by mouth 2 (  two) times daily., Disp: 60 tablet, Rfl: 6   fexofenadine (ALLEGRA) 180 MG tablet, Take 180 mg by mouth daily., Disp: , Rfl:    FLUoxetine (PROZAC) 40 MG capsule, TAKE 1 CAPSULE (40 MG TOTAL) BY MOUTH DAILY., Disp: 90 capsule, Rfl: 1   furosemide (LASIX) 20 MG tablet, Take 1 tablet (20 mg total) by mouth daily. For swelling / fluid retention (Patient taking differently: Take 20 mg by mouth daily as needed for edema. For swelling / fluid retention), Disp: 90 tablet, Rfl: 1   gabapentin (NEURONTIN) 300 MG capsule, Take 2 capsules (600 mg total) by mouth 2 (two) times daily., Disp: 360  capsule, Rfl: 1   hydrALAZINE (APRESOLINE) 25 MG tablet, TAKE 2 TABLETS BY MOUTH 2 TIMES DAILY. FOR BLOOD PRESSURE (Patient taking differently: Take 25 mg by mouth in the morning and at bedtime.), Disp: 360 tablet, Rfl: 1   Insulin Pen Needle 32G X 4 MM MISC, 1 Device by Does not apply route daily., Disp: 100 each, Rfl: 3   Lancets (FREESTYLE) lancets, Use to check blood glucose once a day (Patient taking differently: 1 each by Other route as needed for other (see below). Use to check blood glucose once a day), Disp: 100 each, Rfl: 5   metFORMIN (GLUCOPHAGE-XR) 500 MG 24 hr tablet, Take 2 tablets (1,000 mg total) by mouth 2 (two) times daily., Disp: 360 tablet, Rfl: 1   metoprolol succinate (TOPROL-XL) 100 MG 24 hr tablet, Take 1 tablet (100 mg total) by mouth daily., Disp: 90 tablet, Rfl: 1   montelukast (SINGULAIR) 10 MG tablet, Take 1 tablet (10 mg total) by mouth at bedtime., Disp: 90 tablet, Rfl: 1   nitroGLYCERIN (NITROSTAT) 0.4 MG SL tablet, Place 0.4 mg under the tongue every 5 (five) minutes as needed for chest pain., Disp: , Rfl:    OVER THE COUNTER MEDICATION, Take 1 each by mouth daily. CBD gummy, Disp: , Rfl:    pantoprazole (PROTONIX) 40 MG tablet, TAKE 1 TABLET BY MOUTH TWICE A DAY, Disp: 180 tablet, Rfl: 1   rosuvastatin (CRESTOR) 20 MG tablet, TAKE 1 TABLET BY MOUTH DAILY. FOR CHOLESTEROL / HEART, Disp: 90 tablet, Rfl: 3   Tiotropium Bromide Monohydrate (SPIRIVA RESPIMAT) 2.5 MCG/ACT AERS, INHALE 2 PUFFS BY MOUTH INTO THE LUNGS DAILY (Patient taking differently: Inhale 2 each into the lungs daily.), Disp: 4 g, Rfl: 5   valsartan (DIOVAN) 160 MG tablet, TAKE 1 TABLET BY MOUTH EVERY DAY, Disp: 90 tablet, Rfl: 1   zinc gluconate 50 MG tablet, Take 50 mg by mouth daily., Disp: , Rfl:    Continuous Glucose Receiver (FREESTYLE LIBRE 3 READER) DEVI, by Does not apply route. Free style brand Use to check blood glucose continuously. Change sensor every 10 day as of 11/18/22 (Patient not taking:  Reported on 09/29/2023), Disp: , Rfl:    Continuous Glucose Sensor (FREESTYLE LIBRE 3 SENSOR) MISC, by Does not apply route. Free style brand Use to check blood glucose continuously. Change sensor every 10 day as of 11/18/22 (Patient not taking: Reported on 09/29/2023), Disp: , Rfl:    Dulaglutide (TRULICITY) 0.75 MG/0.5ML SOAJ, Inject 0.75 mg into the skin once a week. Take for 4 weeks, then increase to 1.5mg  weeky (Patient not taking: Reported on 09/29/2023), Disp: 2 mL, Rfl: 0   [START ON 10/18/2023] Dulaglutide (TRULICITY) 1.5 MG/0.5ML SOAJ, Inject 1.5 mg into the skin once a week. (Patient not taking: Reported on 09/29/2023), Disp: 2 mL, Rfl: 1   Insulin Glargine (BASAGLAR  KWIKPEN) 100 UNIT/ML, Inject 30 Units into the skin daily. (Patient not taking: Reported on 09/29/2023), Disp: 15 mL, Rfl: 2   Assessment/Plan:   Diabetes: Last A1c was not at goal - suspect related to not taking Trulicity - Patient received the following instruction for [employer] 3 Personal Continuous Glucose Monitor/ Libre 3 plus sensors  - preparation of placement site - clean with alcohol and allow to dry.  Sensor is to only be place on back of upper arm.  Patient to rotate sides and site.   -care of sensor and site   - reminded that sensor is waterproof up to 3 feet and for 30 minutes.   - Reviewed settings and how to use reader to check BG.   - reviewed Libre 3 reader home screen and how to read and respond to trend arrows.   - reminded that when magnifying glass symbols shows up she is to confirm BG with finger stick before making any treatment decisions. .  - Reviewed goal A1c, goal fasting, and goal 2 hour post prandial glucose  - Patient is instructed to call office if blood glucose < 70 or > 250.  - Recommend to restart GLP1 - restart Trulicity 0.75mg  weekly for 4 weeks, then increase to 1.5mg  weekly. Will try to work back up to higher dose as tolerate.  - [facility] - confirmed that Trulicity and insulin glargine  (Basaglar)  are filled and ready for pick up  - Continue to take metformin and [place] - Patient asked about seeing nutritionist. Referral placed today.   Hypertension / CKD G1/A2: blood pressure not at goal. Will try to get better control of blood glucose and blood pressure. Blood pressure was improved today.  - Recheck miroalbumin in 3 to 6 months. If not improved consider increasing valsartan if not already at max tolerated dose to control blood pressure.  - In future if UACR not improved could also add [place].  - Recommend to continue current blood pressure meds   Patient asked about refill for NTG - will ask PCP if Ok to refill x 1 to use as needed.    Follow Up Plan: 10/20/2023 in office follow up to review blood glucose   Henrene Pastor, PharmD Clinical Pharmacist [facility] Primary Care  [facility] [phone]

## 2023-09-29 NOTE — Telephone Encounter (Signed)
 Patient requesting refill for nitroglycerin.  Last refilled: > 1 year ago Uses rarely as needed for chest pain.  In past has been prescribed by cardio - will see if PCP OK to refill.  [facility] [place] [address] [facility] [address]

## 2023-09-30 NOTE — Addendum Note (Signed)
 Addended by: Henrene Pastor B on: 09/30/2023 10:21 AM   Modules accepted: Orders

## 2023-10-10 ENCOUNTER — Other Ambulatory Visit: Payer: Self-pay | Admitting: Family Medicine

## 2023-10-10 DIAGNOSIS — J302 Other seasonal allergic rhinitis: Secondary | ICD-10-CM

## 2023-10-10 NOTE — Therapy (Signed)
 OUTPATIENT PHYSICAL THERAPY NEURO EVALUATION   Patient Name: Destiny Ballard MRN: [id number] DOB:Oct 25, 1950, 73 y.o., female Today's Date: 10/13/2023   END OF SESSION:  PT End of Session - 10/13/23 0805     Visit Number 1    Date for PT Re-Evaluation 01/05/24    Authorization Type Medicare & [employer]    Progress Note Due on Visit 10    PT Start Time 0805    PT Stop Time 0851    PT Time Calculation (min) 46 min    Activity Tolerance Patient tolerated treatment well    Behavior During Therapy [facility] for tasks assessed/performed             Past Medical History:  Diagnosis Date   Anxiety    Prior suicide attempt   CAD (coronary artery disease)    a) s/p DES to LAD 07/2005 b) Last Myoview low risk 11/2011 showing small fixed apical perfusion defect (prior MI vs attenuation) but no ischemia - normal EF.   Cervical spondylosis    Chest pain 12/10/2011   Chronic diastolic CHF (congestive heart failure) (HCC) 09/18/2017   Chronic eczematous otitis externa of both ears 11/18/2019   Chronic rhinitis 08/07/2016   Complication of anesthesia    developed pneumonia after nerve block was hospitalized   Constipation 12/18/2020   COPD with acute exacerbation (HCC) 04/25/2016   Coronary atherosclerosis 06/28/2008   Depression    Depression with anxiety    Diabetes mellitus (HCC) 09/08/2017   Diabetic neuropathy (HCC)    Dyspnea on exertion 09/02/2012   CXR 07/2012:  No acute process.     Eustachian tube dysfunction, bilateral 11/18/2019   GERD 06/28/2008   Hearing loss    Left   History of colonic polyps 12/18/2020   History of kidney stones    HLD (hyperlipidemia)    Hyperlipidemia associated with type 2 diabetes mellitus (HCC) 08/21/2020   Hypertension    Hypertension associated with diabetes (HCC) 01/30/2016   Hypokalemia 01/27/2016   Insulin resistance    Iron deficiency anemia    Leukocytosis    history of mild leukocytosis   Lobar pneumonia, unspecified organism (HCC)  11/13/2017   Melena 12/18/2020   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   Obesity (BMI 30-39.9) 08/24/2018   Last Assessment & Plan:  Exercise and weight reduction is encouraged.   OSA (obstructive sleep apnea) 05/06/2014   Pneumonia due to COVID-19 virus 08/21/2020   Precordial chest pain    Sciatic nerve disease, left    leg   Sepsis (HCC) 09/18/2017   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   Tinnitus of both ears 11/18/2019   Type 2 diabetes mellitus with hyperglycemia, with long-term current use of insulin (HCC) 12/10/2019   Uncontrolled type 2 diabetes mellitus with complication, with long-term current use of insulin 03/14/2020   Past Surgical History:  Procedure Laterality Date   BREAST ENHANCEMENT SURGERY     CARDIAC CATHETERIZATION  06/17/2007   NORMAL. EF 60%   CARDIAC CATHETERIZATION N/A 01/29/2016   Procedure: Left Heart Cath and Coronary Angiography;  Surgeon: Tonny Bollman, MD;  Location: [facility] INVASIVE CV LAB;  Service: Cardiovascular;  Laterality: N/A;   CERVICAL SPONDYLOSIS     SINGLE LEVEL FUSION   CHILDBIRTH     X3   COLONOSCOPY  2010   normal   COLONOSCOPY WITH PROPOFOL  10/01/2021   CORONARY STENT PLACEMENT  07/15/2005   LEFT ANTERIOR DESCENDING   FOREARM FRACTURE SURGERY  07/15/2008  hand and shoulder    INCISION AND DRAINAGE BREAST ABSCESS  01/05/2012       INCISION AND DRAINAGE PERIRECTAL ABSCESS N/A 02/18/2014   Procedure: IRRIGATION AND DEBRIDEMENT PERIRECTAL ABSCESS;  Surgeon: Valarie Merino, MD;  Location: [facility] ORS;  Service: General;  Laterality: N/A;   LEFT HEART CATH AND CORONARY ANGIOGRAPHY N/A 10/10/2017   Procedure: LEFT HEART CATH AND CORONARY ANGIOGRAPHY;  Surgeon: Kathleene Hazel, MD;  Location: [facility] INVASIVE CV LAB;  Service: Cardiovascular;  Laterality: N/A;   LUMBAR LAMINECTOMY     ORIF WRIST FRACTURE Right 11/27/2021   Procedure: OPEN REDUCTION INTERNAL FIXATION (ORIF) WRIST FRACTURE;   Surgeon: Teryl Lucy, MD;  Location: [facility];  Service: Orthopedics;  Laterality: Right;   ROTATOR CUFF REPAIR     bilaterla   TONSILLECTOMY AND ADENOIDECTOMY     TUBAL LIGATION     VIDEO BRONCHOSCOPY Bilateral 08/13/2016   Procedure: VIDEO BRONCHOSCOPY WITHOUT FLUORO;  Surgeon: Leslye Peer, MD;  Location: [facility];  Service: Cardiopulmonary;  Laterality: Bilateral;   Patient Active Problem List   Diagnosis Date Noted   Diabetes mellitus (HCC) 03/01/2022   Hypoxia 11/27/2021   Osteopenia 06/27/2021   Melena 12/18/2020   History of colonic polyps 12/18/2020   Constipation 12/18/2020   Obesity    Iron deficiency anemia    Insulin resistance    Hyperlipidemia    Cervical spondylosis    Anxiety    Pneumonia due to COVID-19 virus 08/21/2020   Hyperlipidemia associated with type 2 diabetes mellitus (HCC) 08/21/2020   Type 2 diabetes mellitus with diabetic polyneuropathy, with long-term current use of insulin (HCC) 12/10/2019   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   Tinnitus of both ears 11/18/2019   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   Eustachian tube dysfunction, bilateral 11/18/2019   Chronic eczematous otitis externa of both ears 11/18/2019   Obesity (BMI 30-39.9) 08/24/2018   Depression with anxiety 08/24/2018   CAD (coronary artery disease) 08/24/2018   Abnormal cardiovascular stress test 08/24/2018   Precordial chest pain    Sepsis (HCC) 09/18/2017   Chronic diastolic CHF (congestive heart failure) (HCC) 09/18/2017   Chronic rhinitis 08/07/2016   COPD with acute exacerbation (HCC) 04/25/2016   Hypertension associated with diabetes (HCC) 01/30/2016   Diarrhea 09/26/2015   OSA (obstructive sleep apnea) 05/06/2014   Dyspnea on exertion 09/02/2012   GERD 06/28/2008    PCP: Pearline Cables, MD   REFERRING PROVIDER: Suanne Marker, MD   REFERRING DIAG: R26.9 (ICD-10-CM) - Gait difficulty   PT evaluation; gait  diff; back pain   THERAPY DIAG:  Unsteadiness on feet  Chronic bilateral low back pain with left-sided sciatica  Muscle weakness (generalized)  Muscle spasm of back  RATIONALE FOR EVALUATION AND TREATMENT: Rehabilitation  ONSET DATE: 7-8 yrs for sciatic pain & balance issues  NEXT MD VISIT: none scheduled   SUBJECTIVE:  SUBJECTIVE STATEMENT: Pt reports her L sciatic nerve gets very painful. Pain triggered by being on her feet. She also notes her ears feel stopped up and she looses her balance. Constant ringing in her ears - ENT gave her drops. She reports h/o vertigo. She fell last year and broke her R wrist while kicking a ball with her grandson.  Pt accompanied by: self  PAIN: Are you having pain? Yes: NPRS scale: 0/10 currently; up to 10/10 at worst  Pain location: B buttock, sciatica down L LE Pain description: sharp Aggravating factors: being on her feet  Relieving factors: sit, gabapentin   PERTINENT HISTORY:  Anxiety, CAD, CHF, COPD, cervical spondylosis s/p single level fusion, DM-II, diabetic neuropathy, depression, GERD, HTN, obesity, osteopenia/osteoporosis, lumbar laminectomy, ORIF R wrist fx 11/2021, vertigo  PRECAUTIONS: Fall  RED FLAGS: None  WEIGHT BEARING RESTRICTIONS: No  FALLS:  Has patient fallen in last 6 months? Yes. Number of falls 1  LIVING ENVIRONMENT: Lives with: lives with their daughter - dtr lives upstairs, pt lives on lower level Lives in: House/apartment - split level Stairs: Yes: Internal: 8 steps; no railings Has following equipment at home: Single point cane, Walker - 4 wheeled, and Grab bars  OCCUPATION: Retired  PLOF: Independent and Leisure: mostly sedentary   PATIENT GOALS: "For the pain to go away."   OBJECTIVE: (objective  measures completed at initial evaluation unless otherwise dated)  DIAGNOSTIC FINDINGS:  08/04/23 - MR Brain IMPRESSION: 1. No acute intracranial process. No evidence of acute or subacute infarct. 2. Mildly advanced cerebral volume loss for age, with ex vacuo dilatation of the ventricles. No disproportionate lobar atrophy.  11/21/21 - CT lumbar spine IMPRESSION: Minor acute superior endplate fracture at L2, more towards the left side, with loss of height of 10%. No retropulsed bone.   Mild degenerative disc disease and degenerative facet disease without compressive stenosis.  COGNITION: Overall cognitive status: Within functional limits for tasks assessed   SENSATION: WFL Diabetic neuropathy - Numbness and tingling in L>R foot  POSTURE:  rounded shoulders, forward head, decreased lumbar lordosis, and flexed trunk   PALPATION: TTP & increased muscle tension over L>R upper glutes; TTP over L greater trochanter  LUMBAR ROM:   Active  Eval  Flexion WFL  Extension WFL  Right lateral flexion WFL  Left lateral flexion WFL p!  Right rotation WFL  Left rotation WFL   (Blank rows = not tested)   LUMBAR SPECIAL TESTS:  Straight leg raise test: Negative and Slump test: Negative  MUSCLE LENGTH: Hamstrings: mild tight R, mod tight L ITB: mild tight B Piriformis: mild tight R, mod tight L Hip flexors: mild tight B Quads: mild tight L Heelcord: NT  LOWER EXTREMITY ROM:    Limited L hip ER  LOWER EXTREMITY MMT:    MMT Right eval Left eval  Hip flexion 4 4-  Hip extension 4 4-  Hip abduction 4 4-  Hip adduction 4+ 4  Hip internal rotation 4+ 4+  Hip external rotation 4- 4-  Knee flexion 4+ 4+  Knee extension 4+ 4+  Ankle dorsiflexion 4+ 4  Ankle plantarflexion    Ankle inversion    Ankle eversion    (Blank rows = not tested)  BED MOBILITY:  Independent  TRANSFERS: Assistive device utilized: None  Sit to stand: Complete Independence Stand to sit: Complete  Independence Chair to chair: NT Floor:  NT  GAIT: Distance walked: clinic distances Assistive device utilized: None Level of assistance: Complete Independence  Gait pattern: decreased stance time- Left, antalgic, and trunk flexed Comments:   STAIRS:  Level of Assistance:  TBA  Stair Negotiation Technique:   Number of Stairs:    Height of Stairs:   Comments:   FUNCTIONAL TESTS: (Remaining tests TBA next visit) 5 times sit to stand: 21.91 sec Timed up and go (TUG):   10 meter walk test: 11.59 sec; 2.83 ft/sec Berg Balance Scale:   Functional gait assessment:    PATIENT SURVEYS:  ABC scale 590 / 1600 = 36.9%; <50% indicates low level of physical functioning  Modified Oswestry 13 / 50 = 26.0%; moderate disability    TODAY'S TREATMENT:   10/13/2023  SELF CARE:  Reviewed eval findings and role of PT in addressing identified deficits as well as need for further assessment.  PATIENT EDUCATION:  Education details: PT eval findings, anticipated POC, and need for further assessment of dynamic balance   Person educated: Patient Education method: Explanation Education comprehension: verbalized understanding  HOME EXERCISE PROGRAM: TBD   ASSESSMENT:  CLINICAL IMPRESSION: Destiny Ballard is a 73 y.o. female who was referred to physical therapy for evaluation and treatment for gait difficulty due to LBP and impaired balance.  Patient presents with physical impairments of impaired activity tolerance, impaired standing balance, impaired ambulation, and decreased safety awareness impacting safe and independent functional mobility.  Examination revealed patient is at risk for falls and functional decline as evidenced by the following objective test measures: Gait speed 2.83 ft/sec, (2.62 ft/sec is needed for community access) and 5xSTS of 21.91 sec (>15 sec indicates increased risk for falls and decreased BLE power).  Further balance assessment pending with TUG, Berg and FGA.  ABC scale  score of 36.9% indicates low level of physical functioning and risk for recurrent falls.  On Modified Oswestry patient scored 13/50 demonstrating 26% or moderate disability.  Destiny "[name]" will benefit from skilled PT to address above deficits to improve mobility and activity tolerance to help reach the maximal level of functional independence and mobility. Patient demonstrates understanding of this POC and is in agreement with this plan.   OBJECTIVE IMPAIRMENTS: Abnormal gait, decreased activity tolerance, decreased balance, decreased knowledge of condition, decreased mobility, difficulty walking, decreased ROM, decreased strength, dizziness, impaired perceived functional ability, increased muscle spasms, impaired flexibility, impaired sensation, improper body mechanics, postural dysfunction, and pain.   ACTIVITY LIMITATIONS: carrying, lifting, bending, standing, squatting, sleeping, stairs, transfers, bathing, dressing, and locomotion level  PARTICIPATION LIMITATIONS: meal prep, cleaning, laundry, driving, shopping, and community activity  PERSONAL FACTORS: Age, Fitness, Past/current experiences, Time since onset of injury/illness/exacerbation, and 3+ comorbidities: Anxiety, CAD, CHF, COPD, cervical spondylosis s/p single level fusion, DM-II, diabetic neuropathy, depression, GERD, HTN, obesity, osteopenia/osteoporsis, lumbar laminectomy, ORIF R wrist fx 11/2021  are also affecting patient's functional outcome.   REHAB POTENTIAL: Good  CLINICAL DECISION MAKING: Evolving/moderate complexity  EVALUATION COMPLEXITY: Moderate   GOALS: Goals reviewed with patient? Yes  SHORT TERM GOALS: Target date: 11/10/2023  Patient will be independent with initial HEP to improve outcomes and carryover.  Baseline:  Goal status: INITIAL  2.  Patient will be educated on strategies to decrease risk of falls.  Baseline:  Goal status: INITIAL  3.  Patient will improve 5xSTS time to </= 15 seconds for  improved efficiency and safety with transfers. Baseline: 21.91 sec Goal status: INITIAL  LONG TERM GOALS: Target date: 01/05/2024  Patient will be independent with advanced/ongoing HEP to facilitate ability to maintain/progress functional gains from skilled physical therapy services.  Baseline:  Goal status: INITIAL  2.  Patient will be able to ambulate 600' w/o LRAD on variable surfaces with good safety to access community.  Baseline:  Goal status: INITIAL  3.  Patient will be able to step up/down curb safely with LRAD for safety with community ambulation.  Baseline:  Goal status: INITIAL   4.  Patient will demonstrate improved B LE strength to >/= 4+/5 for improved stability and ease of mobility . Baseline: Refer to above LE MMT table Goal status: INITIAL  5.  Patient will demonstrate decreased TUG time to </= 13.5 sec to decrease risk for falls with transitional mobility. Baseline: TBA Goal status: INITIAL   6.  Patient will improve Berg score by at least 8 points to improve safety and stability with ADLs in standing and reduce risk for falls. (MCID= 8 points)  Baseline: TBA Goal status: INITIAL  7.  Patient will improve FGA score to at least 19/30 to improve gait stability and reduce risk for falls. Baseline: TBA Goal status: INITIAL  8.  Patient will report >/= 50% on ABC scale (MCID = 19%) to demonstrate improved balance confidence with functional mobility and gait. Baseline: 590 / 1600 = 36.9 % Goal status: INITIAL  9.  Patient will report </= 14% on Modified Oswestry (MCID = 12%) to demonstrate improved functional ability with decreased pain interference. Baseline: 13 / 50 = 26.0 % Goal status: INITIAL   PLAN:  PT FREQUENCY: 2x/week  PT DURATION: 8-12 weeks  PLANNED INTERVENTIONS: 97164- PT Re-evaluation, 97110-Therapeutic exercises, 97530- Therapeutic activity, [id number]- Neuromuscular re-education, 97535- Self Care, [id number]- Manual therapy, [phone]- Gait training,  [phone]- Canalith repositioning, [id number]- Aquatic Therapy, [phone]- Electrical stimulation (unattended), [phone]- Electrical stimulation (manual), [id number]- Ultrasound, [id number]- Traction (mechanical), [id number]- Ionotophoresis 4mg /ml Dexamethasone, Patient/Family education, Balance training, Stair training, Taping, Dry Needling, Joint mobilization, Spinal mobilization, Vestibular training, Visual/preceptual remediation/compensation, Cryotherapy, Moist heat, and 97750- Physical Performance Test or Measurement  PLAN FOR NEXT SESSION: Complete balance assessment - TUG, Berg & FGA; Created initial HEP for lumbopelvic flexibility and strengthening; MT +/- TPDN to address abnormal muscle tension/tightness and pain; Possible vestibular assessment as indicated   Marry Guan, PT 10/13/2023, 6:02 PM

## 2023-10-13 ENCOUNTER — Encounter: Payer: Self-pay | Admitting: Physical Therapy

## 2023-10-13 ENCOUNTER — Ambulatory Visit: Attending: Diagnostic Neuroimaging | Admitting: Physical Therapy

## 2023-10-13 ENCOUNTER — Other Ambulatory Visit: Payer: Self-pay

## 2023-10-13 DIAGNOSIS — M6281 Muscle weakness (generalized): Secondary | ICD-10-CM

## 2023-10-13 DIAGNOSIS — M5442 Lumbago with sciatica, left side: Secondary | ICD-10-CM | POA: Insufficient documentation

## 2023-10-13 DIAGNOSIS — G8929 Other chronic pain: Secondary | ICD-10-CM

## 2023-10-13 DIAGNOSIS — R2681 Unsteadiness on feet: Secondary | ICD-10-CM | POA: Diagnosis not present

## 2023-10-13 DIAGNOSIS — M62830 Muscle spasm of back: Secondary | ICD-10-CM

## 2023-10-13 DIAGNOSIS — R269 Unspecified abnormalities of gait and mobility: Secondary | ICD-10-CM | POA: Diagnosis not present

## 2023-10-15 ENCOUNTER — Ambulatory Visit: Admitting: Physical Therapy

## 2023-10-16 NOTE — Progress Notes (Unsigned)
 Diabetes Self-Management Education  Visit Type: First/Initial  Appt. Start Time: 0932 Appt. End Time: 1032  10/17/2023  Destiny Ballard, identified by name and date of birth, is a 73 y.o. female with a diagnosis of Diabetes: Type 2.   ASSESSMENT  Patient is here today alone. Pt is wearing libre sensor and states reader is at home. Patient would like to learn more about healthy eating for blood sugars. Patient lives with her daughter.  Pt reports she does her own shopping and cooking. Pt reports eating out about 2-3 times weekly. Pt reports she has sleep apnea and denies CPAP/biPAP use. Pt reports her blood sugar was "153" this morning before breakfast. Pt reports weekly GLP has decreased appetite. Pt reports she stress eats at times. Pt reports she has a safe neighborhood and would like to start walking. All Pt's questions were answered during this encounter.    Past Medical History:  Diagnosis Date   Anxiety    Prior suicide attempt   CAD (coronary artery disease)    a) s/p DES to LAD 07/2005 b) Last Myoview low risk 11/2011 showing small fixed apical perfusion defect (prior MI vs attenuation) but no ischemia - normal EF.   Cervical spondylosis    Chest pain 12/10/2011   Chronic diastolic CHF (congestive heart failure) (HCC) 09/18/2017   Chronic eczematous otitis externa of both ears 11/18/2019   Chronic rhinitis 08/07/2016   Complication of anesthesia    developed pneumonia after nerve block was hospitalized   Constipation 12/18/2020   COPD with acute exacerbation (HCC) 04/25/2016   Coronary atherosclerosis 06/28/2008   Depression    Depression with anxiety    Diabetes mellitus (HCC) 09/08/2017   Diabetic neuropathy (HCC)    Dyspnea on exertion 09/02/2012   CXR 07/2012:  No acute process.     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 Free style brand Use to check blood glucose continuously. Change sensor every 10 day as of 11/18/22, Disp: , Rfl:    Continuous Glucose Sensor (FREESTYLE LIBRE 3 SENSOR) MISC, by Does not apply route. Free style brand Use to check blood glucose continuously. Change sensor every 10 day as of 11/18/22, Disp: , Rfl:  dapagliflozin propanediol (FARXIGA) 10 MG TABS tablet, Take 1 tablet (10 mg total) by mouth daily., Disp: 90 tablet, Rfl: 1   Dulaglutide (TRULICITY) 0.75 MG/0.5ML SOAJ, Inject 0.75 mg into the skin once a week. Take for 4 weeks, then increase to 1.5mg  weeky, Disp: 2 mL, Rfl: 0   Insulin Glargine (BASAGLAR KWIKPEN) 100 UNIT/ML, Inject 30 Units into the skin daily., Disp: 15 mL, Rfl: 2   metFORMIN (GLUCOPHAGE-XR) 500 MG 24 hr tablet, Take 2 tablets (1,000 mg total) by mouth 2 (two)  times daily., Disp: 360 tablet, Rfl: 1   albuterol (VENTOLIN HFA) 108 (90 Base) MCG/ACT inhaler, Inhale 2 puffs into the lungs every 8 (eight) hours as needed for wheezing or shortness of breath., Disp: 18 g, Rfl: 5   aspirin EC 81 MG tablet, Take 81 mg by mouth daily., Disp: , Rfl:    calcium carbonate (TUMS EX) 750 MG chewable tablet, Chew 1 tablet by mouth daily as needed for heartburn., Disp: , Rfl:    Cholecalciferol (VITAMIN D3) 1000 UNITS CAPS, Take 1,000 Units by mouth daily., Disp: , Rfl:    [START ON 10/18/2023] Dulaglutide (TRULICITY) 1.5 MG/0.5ML SOAJ, Inject 1.5 mg into the skin once a week. (Patient not taking: Reported on 10/13/2023), Disp: 2 mL, Rfl: 1   famotidine (PEPCID) 40 MG tablet, Take 1 tablet (40 mg total) by mouth 2 (two) times daily. (Patient not taking: Reported on 10/13/2023), Disp: 60 tablet, Rfl: 6   fexofenadine (ALLEGRA) 180 MG tablet, Take 180 mg by mouth daily., Disp: , Rfl:    FLUoxetine (PROZAC) 40 MG capsule, TAKE 1 CAPSULE (40 MG TOTAL) BY MOUTH DAILY., Disp: 90 capsule, Rfl: 1   furosemide (LASIX) 20 MG tablet, Take 1 tablet (20 mg total) by mouth daily. For swelling / fluid retention (Patient taking differently: Take 20 mg by mouth daily as needed for edema. For swelling / fluid retention), Disp: 90 tablet, Rfl: 1   gabapentin (NEURONTIN) 300 MG capsule, Take 2 capsules (600 mg total) by mouth 2 (two) times daily., Disp: 360 capsule, Rfl: 1   hydrALAZINE (APRESOLINE) 25 MG tablet, TAKE 2 TABLETS BY MOUTH 2 TIMES DAILY. FOR BLOOD PRESSURE (Patient taking differently: Take 25 mg by mouth in the morning and at bedtime.), Disp: 360 tablet, Rfl: 1   Insulin Pen Needle 32G X 4 MM MISC, 1 Device by Does not apply route daily. (Patient not taking: Reported on 10/13/2023), Disp: 100 each, Rfl: 3   Lancets (FREESTYLE) lancets, Use to check blood glucose once a day (Patient not taking: Reported on 10/13/2023), Disp: 100 each, Rfl: 5   metoprolol succinate (TOPROL-XL) 100 MG 24  hr tablet, Take 1 tablet (100 mg total) by mouth daily., Disp: 90 tablet, Rfl: 1   montelukast (SINGULAIR) 10 MG tablet, Take 1 tablet (10 mg total) by mouth at bedtime., Disp: 90 tablet, Rfl: 1   nitroGLYCERIN (NITROSTAT) 0.4 MG SL tablet, Place 1 tablet (0.4 mg total) under the tongue every 5 (five) minutes as needed for chest pain., Disp: 25 tablet, Rfl: 1   OVER THE COUNTER MEDICATION, Take 1 each by mouth daily. CBD gummy (Patient not taking: Reported on 10/13/2023), Disp: , Rfl:    pantoprazole (PROTONIX) 40 MG tablet, TAKE 1 TABLET BY MOUTH TWICE A DAY, Disp: 180 tablet, Rfl: 1   rosuvastatin (CRESTOR) 20 MG tablet, TAKE 1 TABLET BY MOUTH DAILY. FOR CHOLESTEROL / HEART, Disp: 90 tablet, Rfl: 3   Tiotropium Bromide Monohydrate (SPIRIVA RESPIMAT) 2.5 MCG/ACT AERS,  INHALE 2 PUFFS BY MOUTH INTO THE LUNGS DAILY (Patient not taking: Reported on 10/13/2023), Disp: 4 g, Rfl: 5   valsartan (DIOVAN) 160 MG tablet, TAKE 1 TABLET BY MOUTH EVERY DAY, Disp: 90 tablet, Rfl: 1   zinc gluconate 50 MG tablet, Take 50 mg by mouth daily. (Patient not taking: Reported on 10/13/2023), Disp: , Rfl:    Lab Results  Component Value Date   HGBA1C 8.4 (H) 07/30/2023   Lab Results  Component Value Date   CHOL 261 (H) 07/30/2023   HDL 51.30 07/30/2023   LDLCALC 135 (H) 07/30/2023   LDLDIRECT 51.0 09/18/2022   TRIG 377.0 (H) 07/30/2023   CHOLHDL 5 07/30/2023   There were no vitals taken for this visit. There is no height or weight on file to calculate BMI.   Diabetes Self-Management Education - 10/17/23 0938       Visit Information   Visit Type First/Initial      Initial Visit   Diabetes Type Type 2    Date Diagnosed 2010    Are you currently following a meal plan? No    Are you taking your medications as prescribed? Yes      Health Coping   How would you rate your overall health? Fair      Psychosocial Assessment   Patient Belief/Attitude about Diabetes Afraid    What is the hardest part about your  diabetes right now, causing you the most concern, or is the most worrisome to you about your diabetes?   Making healty food and beverage choices;Being active    Self-care barriers None    Self-management support Doctor's office    Other persons present Patient    Patient Concerns Nutrition/Meal planning;Problem Solving;Support    Special Needs None    Preferred Learning Style Hands on    Learning Readiness Ready    How often do you need to have someone help you when you read instructions, pamphlets, or other written materials from your doctor or pharmacy? 3 - Sometimes    What is the last grade level you completed in school? 12th      Pre-Education Assessment   Patient understands the diabetes disease and treatment process. Comprehends key points    Patient understands incorporating nutritional management into lifestyle. Needs Review    Patient undertands incorporating physical activity into lifestyle. Needs Review    Patient understands using medications safely. Comprehends key points    Patient understands monitoring blood glucose, interpreting and using results Needs Review    Patient understands prevention, detection, and treatment of acute complications. Needs Review    Patient understands prevention, detection, and treatment of chronic complications. Needs Review    Patient understands how to develop strategies to address psychosocial issues. Needs Review    Patient understands how to develop strategies to promote health/change behavior. Needs Review      Complications   Last HgB A1C per patient/outside source 8.4 %    How often do you check your blood sugar? > 4 times/day   CGM   Fasting Blood glucose range (mg/dL) 161-096;045-409;>811    Postprandial Blood glucose range (mg/dL) >914;782-956    Number of hypoglycemic episodes per month 0    Number of hyperglycemic episodes ( >200mg /dL): Weekly    Can you tell when your blood sugar is high? Yes    What do you do if your blood  sugar is high? headache    Have you had a dilated eye exam in the past 12  months? Yes    Have you had a dental exam in the past 12 months? Yes    Are you checking your feet? No      Dietary Intake   Breakfast 2 poptart 2 banana, SF twist    Snack (morning) nabs    Lunch 2 slices white bread, tuna or egg, mayo, SF twist    Snack (afternoon) gummies    Dinner applebees or hibachi, sweet tea or pepsi or mac and cheese, SF twist    Beverage(s) SF twist, pepsi, sweet tea, water      Activity / Exercise   Activity / Exercise Type ADL's      Patient Education   Previous Diabetes Education Yes (please comment)    Disease Pathophysiology Definition of diabetes, type 1 and 2, and the diagnosis of diabetes    Healthy Eating Plate Method;Food label reading, portion sizes and measuring food.    Being Active Helped patient identify appropriate exercises in relation to his/her diabetes, diabetes complications and other health issue.;Role of exercise on diabetes management, blood pressure control and cardiac health.    Medications Taught/reviewed insulin/injectables, injection, site rotation, insulin/injectables storage and needle disposal.    Monitoring Daily foot exams;Yearly dilated eye exam    Acute complications Taught prevention, symptoms, and  treatment of hypoglycemia - the 15 rule.    Chronic complications Dental care;Assessed and discussed foot care and prevention of foot problems    Diabetes Stress and Support Identified and addressed patients feelings and concerns about diabetes;Worked with patient to identify barriers to care and solutions;Helped patient identify a support system for diabetes management    Preconception care Other (comment)   n/a   Lifestyle and Health Coping Lifestyle issues that need to be addressed for better diabetes care      Individualized Goals (developed by patient)   Nutrition Follow meal plan discussed    Physical Activity Exercise 1-2 times per week;15 minutes  per day    Medications take my medication as prescribed    Monitoring  Consistenly use CGM    Problem Solving Sleep Pattern    Reducing Risk examine blood glucose patterns;treat hypoglycemia with 15 grams of carbs if blood glucose less than 70mg /dL;do foot checks daily    Health Coping Ask for help with psychological, social, or emotional issues      Post-Education Assessment   Patient understands the diabetes disease and treatment process. Comprehends key points    Patient understands incorporating nutritional management into lifestyle. Needs Review    Patient undertands incorporating physical activity into lifestyle. Comprehends key points    Patient understands using medications safely. Comphrehends key points    Patient understands monitoring blood glucose, interpreting and using results Comprehends key points    Patient understands prevention, detection, and treatment of acute complications. Demonstrates understanding / competency    Patient understands prevention, detection, and treatment of chronic complications. Comprehends key points    Patient understands how to develop strategies to address psychosocial issues. Needs Review    Patient understands how to develop strategies to promote health/change behavior. Needs Review      Outcomes   Expected Outcomes Demonstrated interest in learning. Expect positive outcomes    Future DMSE 3-4 months    Program Status Not Completed             Individualized Plan for Diabetes Self-Management Training:   Learning Objective:  Patient will have a greater understanding of diabetes self-management. Patient education plan is to attend individual  and/or group sessions per assessed needs and concerns.   Plan:   Patient Instructions  Increase water intake  Aim for balanced meals and snacks  Walking in the neighborhood as tolerated   Expected Outcomes:  Demonstrated interest in learning. Expect positive outcomes  Education material  provided: ADA - How to Thrive: A Guide for Your Journey with Diabetes, My Plate, Snack sheet, Support group flyer, and Diabetes Resources  If problems or questions, patient to contact team via:  Phone  Future DSME appointment: 3-4 months

## 2023-10-17 ENCOUNTER — Encounter: Attending: Family Medicine | Admitting: Dietician

## 2023-10-17 DIAGNOSIS — E119 Type 2 diabetes mellitus without complications: Secondary | ICD-10-CM | POA: Diagnosis not present

## 2023-10-17 DIAGNOSIS — Z7985 Long-term (current) use of injectable non-insulin antidiabetic drugs: Secondary | ICD-10-CM | POA: Diagnosis not present

## 2023-10-17 DIAGNOSIS — E1159 Type 2 diabetes mellitus with other circulatory complications: Secondary | ICD-10-CM | POA: Diagnosis not present

## 2023-10-17 DIAGNOSIS — Z794 Long term (current) use of insulin: Secondary | ICD-10-CM | POA: Insufficient documentation

## 2023-10-17 NOTE — Patient Instructions (Signed)
 Increase water intake  Aim for balanced meals and snacks  Walking in the neighborhood as tolerated

## 2023-10-20 ENCOUNTER — Ambulatory Visit: Admitting: Pharmacist

## 2023-10-20 VITALS — BP 138/80 | HR 82 | Wt 165.8 lb

## 2023-10-20 DIAGNOSIS — I152 Hypertension secondary to endocrine disorders: Secondary | ICD-10-CM

## 2023-10-20 DIAGNOSIS — E1159 Type 2 diabetes mellitus with other circulatory complications: Secondary | ICD-10-CM | POA: Diagnosis not present

## 2023-10-20 DIAGNOSIS — Z794 Long term (current) use of insulin: Secondary | ICD-10-CM | POA: Diagnosis not present

## 2023-10-20 MED ORDER — ALBUTEROL SULFATE HFA 108 (90 BASE) MCG/ACT IN AERS: 2.0000 | INHALATION_SPRAY | Freq: Three times a day (TID) | RESPIRATORY_TRACT | 1 refills | Status: AC | PRN

## 2023-10-20 NOTE — Progress Notes (Signed)
 10/20/23  Name: Destiny Ballard MRN: [id number] DOB: 07/08/1951  Chief Complaint  Patient presents with   Diabetes   Medication Management    DAPHANE ODEKIRK is a 73 y.o. year old female who presented for an office visit today for follow up diabetes and Continuous Glucose Monitor review   They were referred to the pharmacist by their PCP for assistance in managing diabetes, hypertension, and complex medication management.    Subjective:  Medication Access/Adherence  Current Pharmacy:  [facility] [address] [place] - [address] [place] [address] Phone: [phone] Fax: [phone]   Patient reports affordability concerns with their medications: No  Patient reports access/transportation concerns to their pharmacy: No  Patient reports adherence concerns with their medications:  Yes  - not able to refill Basaglar yet, has not been taking Trulicity 4.5mg  in several months - patient is unsure why she stopped.    Diabetes:  Current medications:  Basaglar 30 units once a day  Metformin ER 500mg  - take 1000mg  = 2 tabs twice a day Farxiga 10mg  daily  Trulicity 0.75mg  weekly for 4 weeks. She took her last 0.75mg  dose today. Due to increase to 1.5mg  weekly going forward.   Has lost about 4 lbs since restarting Trulicity.  She has seen nutritionist about DM and weight loss s since our last visti. She is limiting serving sizes of high CHO foods. She states Continuous Glucose Monitor has helped her identify foods that affect her blood glucose the most. She has follow up with nutritionist in 1 to 2 months.   Wt Readings from Last 3 Encounters:  10/20/23 165 lb 12.8 oz (75.2 kg)  09/02/23 169 lb 12.8 oz (77 kg)  07/30/23 168 lb 6.4 oz (76.4 kg)   CGM Documentation:  Days Worn: 14 (recommend 14 days) % Time CGM is active: 95% (goal >=70%) Average Glucose: 168 mg/dL Glucose Management Indicator: 7.3% Glucose Variability: 19.9% (goal  <36%) Time in Range:  - Time above range >250: 2% (typical goal: <5%) - Time above range 181-250: 26% (typical goal <20%) - Time in range 70-180 72% (typical goal >=70%) - Time below range 54-69: 0% (typical goal <4%) - Time below range: 0% (typical goal <1%)     Hypertension / CKD   Current medications: furosemide 20mg  daily (patient taking as needed for edema), hydralazine 25mg  - take 2 tabs = 50mg  twice a day (patient is only taking 1 tablet = 25mg  twice a day) , metoprolol ER 100mg  daily, valsartan 160mg  daily.  Lab Results  Component Value Date   MICROALBUR 7.4 (H) 07/30/2023   MICROALBUR 5.1 (H) 05/20/2022   07/30/2023 urine microalbumin - 7.4 mg/dL; Creatinine was 47.3mg /dL Reported UmACR was 32.3. Actual UACR was 156.49 mg/g  BP Readings from Last 3 Encounters:  10/20/23 138/80  09/29/23 132/62  09/02/23 (!) 160/75     Objective:  Lab Results  Component Value Date   HGBA1C 8.4 (H) 07/30/2023    Lab Results  Component Value Date   CREATININE 0.55 07/30/2023   BUN 12 07/30/2023   NA 143 07/30/2023   K 3.7 07/30/2023   CL 102 07/30/2023   CO2 31 07/30/2023    Lab Results  Component Value Date   CHOL 261 (H) 07/30/2023   HDL 51.30 07/30/2023   LDLCALC 135 (H) 07/30/2023   LDLDIRECT 51.0 09/18/2022   TRIG 377.0 (H) 07/30/2023   CHOLHDL 5 07/30/2023     Current Outpatient Medications:  aspirin EC 81 MG tablet, Take 81 mg by mouth daily., Disp: , Rfl:    calcium carbonate (TUMS EX) 750 MG chewable tablet, Chew 1 tablet by mouth daily as needed for heartburn., Disp: , Rfl:    chlorhexidine (PERIDEX) 0.12 % solution, SMARTSIG:0.5 Ounce(s) By Mouth Twice Daily, Disp: , Rfl:    cholecalciferol (VITAMIN D3) 25 MCG (1000 UNIT) tablet, Take 2,000 Units by mouth daily., Disp: , Rfl:    clindamycin (CLEOCIN) 300 MG capsule, Take 300 mg by mouth 2 (two) times daily., Disp: , Rfl:    Continuous Glucose Receiver (FREESTYLE LIBRE 3 READER) DEVI, by Does not apply  route. Free style brand Use to check blood glucose continuously. Change sensor every 10 day as of 11/18/22, Disp: , Rfl:    Continuous Glucose Sensor (FREESTYLE LIBRE 3 SENSOR) MISC, by Does not apply route. Free style brand Use to check blood glucose continuously. Change sensor every 10 day as of 11/18/22, Disp: , Rfl:    cyanocobalamin (VITAMIN B12) 1000 MCG tablet, Take 3,000 mcg by mouth daily., Disp: , Rfl:    dapagliflozin propanediol (FARXIGA) 10 MG TABS tablet, Take 1 tablet (10 mg total) by mouth daily., Disp: 90 tablet, Rfl: 1   Dulaglutide (TRULICITY) 0.75 MG/0.5ML SOAJ, Inject 0.75 mg into the skin once a week. Take for 4 weeks, then increase to 1.5mg  weeky, Disp: 2 mL, Rfl: 0   famotidine (PEPCID) 40 MG tablet, Take 1 tablet (40 mg total) by mouth 2 (two) times daily., Disp: 60 tablet, Rfl: 6   fexofenadine (ALLEGRA) 180 MG tablet, Take 180 mg by mouth daily., Disp: , Rfl:    FLUoxetine (PROZAC) 40 MG capsule, TAKE 1 CAPSULE (40 MG TOTAL) BY MOUTH DAILY., Disp: 90 capsule, Rfl: 1   furosemide (LASIX) 20 MG tablet, Take 1 tablet (20 mg total) by mouth daily. For swelling / fluid retention (Patient taking differently: Take 20 mg by mouth daily as needed for edema. For swelling / fluid retention), Disp: 90 tablet, Rfl: 1   gabapentin (NEURONTIN) 300 MG capsule, Take 2 capsules (600 mg total) by mouth 2 (two) times daily., Disp: 360 capsule, Rfl: 1   hydrALAZINE (APRESOLINE) 25 MG tablet, TAKE 2 TABLETS BY MOUTH 2 TIMES DAILY. FOR BLOOD PRESSURE (Patient taking differently: Take 25 mg by mouth in the morning and at bedtime.), Disp: 360 tablet, Rfl: 1   Insulin Glargine (BASAGLAR KWIKPEN) 100 UNIT/ML, Inject 30 Units into the skin daily., Disp: 15 mL, Rfl: 2   Insulin Pen Needle 32G X 4 MM MISC, 1 Device by Does not apply route daily., Disp: 100 each, Rfl: 3   metFORMIN (GLUCOPHAGE-XR) 500 MG 24 hr tablet, Take 2 tablets (1,000 mg total) by mouth 2 (two) times daily., Disp: 360 tablet, Rfl: 1    metoprolol succinate (TOPROL-XL) 100 MG 24 hr tablet, Take 1 tablet (100 mg total) by mouth daily., Disp: 90 tablet, Rfl: 1   montelukast (SINGULAIR) 10 MG tablet, Take 1 tablet (10 mg total) by mouth at bedtime., Disp: 90 tablet, Rfl: 1   nitroGLYCERIN (NITROSTAT) 0.4 MG SL tablet, Place 1 tablet (0.4 mg total) under the tongue every 5 (five) minutes as needed for chest pain., Disp: 25 tablet, Rfl: 1   pantoprazole (PROTONIX) 40 MG tablet, TAKE 1 TABLET BY MOUTH TWICE A DAY, Disp: 180 tablet, Rfl: 1   rosuvastatin (CRESTOR) 20 MG tablet, TAKE 1 TABLET BY MOUTH DAILY. FOR CHOLESTEROL / HEART, Disp: 90 tablet, Rfl: 3   valsartan (DIOVAN)  160 MG tablet, TAKE 1 TABLET BY MOUTH EVERY DAY, Disp: 90 tablet, Rfl: 1   albuterol (VENTOLIN HFA) 108 (90 Base) MCG/ACT inhaler, Inhale 2 puffs into the lungs every 8 (eight) hours as needed for wheezing or shortness of breath. (Patient not taking: Reported on 10/20/2023), Disp: 18 g, Rfl: 5   Dulaglutide (TRULICITY) 1.5 MG/0.5ML SOAJ, Inject 1.5 mg into the skin once a week. (Patient not taking: Reported on 09/29/2023), Disp: 2 mL, Rfl: 1   Lancets (FREESTYLE) lancets, Use to check blood glucose once a day (Patient not taking: Reported on 10/20/2023), Disp: 100 each, Rfl: 5   OVER THE COUNTER MEDICATION, Take 1 each by mouth daily. CBD gummy (Patient not taking: Reported on 10/20/2023), Disp: , Rfl:    Tiotropium Bromide Monohydrate (SPIRIVA RESPIMAT) 2.5 MCG/ACT AERS, INHALE 2 PUFFS BY MOUTH INTO THE LUNGS DAILY (Patient not taking: Reported on 10/20/2023), Disp: 4 g, Rfl: 5   zinc gluconate 50 MG tablet, Take 50 mg by mouth daily. (Patient not taking: Reported on 10/13/2023), Disp: , Rfl:    Assessment/Plan:   Diabetes: Last A1c was not at goal - but per Continuous Glucose Monitor report GMI improved to 7.3% since restarting Trulicity. Patient has also seen 4lbs weight loss.  - reviewed recent Continuous Glucose Monitor report with patient. Provided copy of report.  -  Reviewed goal A1c, goal fasting, and goal 2 hour post prandial glucose  - Patient is instructed to call office if blood glucose < 70 or > 250.  - Recommend to increase Trulicity to 1.5mg  weekly with next dose .  - Continue Basaglar 30 units daily - hope that we can eventually start to lower dose  - Continue to take metformin and Faxiga  Hypertension / CKD G1/A2: blood pressure in office as st goal today.  - Recheck miroalbumin in 3 to 6 months. If not improved consider increasing valsartan if not already at max tolerated dose to control blood pressure.  - In future if UACR not improved could also add [place].  - Recommend to continue current blood pressure meds    Follow Up Plan: 1 month to review blood glucose and recheck weight and A1c.    Henrene Pastor, PharmD Clinical Pharmacist [facility]  Population Health [phone]

## 2023-10-23 ENCOUNTER — Other Ambulatory Visit: Payer: Self-pay | Admitting: Family Medicine

## 2023-10-23 DIAGNOSIS — Z794 Long term (current) use of insulin: Secondary | ICD-10-CM

## 2023-10-23 DIAGNOSIS — E785 Hyperlipidemia, unspecified: Secondary | ICD-10-CM

## 2023-10-27 NOTE — Progress Notes (Signed)
 10/27/2023 Addendum:  Message from PCP requesting assistance with Continuous Glucose Monitor and coverage. She has been referred in past for Chronic Care Management.   Copland, Skipper Dumas, MD  Cecilie Coffee, RPH-CPP Hi Redell Nazir-I believe you have tried to connect with this patient but never actually saw her.  I am getting a form from [facility] supply about a CGM; I put the fax in your chart box.  Is this something you would be able to help with (after seeing her of course!)?  Thank you! Daryll Epp, PharmD Clinical Pharmacist [facility]

## 2023-10-28 ENCOUNTER — Telehealth: Payer: Self-pay

## 2023-10-28 ENCOUNTER — Ambulatory Visit: Attending: Diagnostic Neuroimaging

## 2023-10-28 DIAGNOSIS — R2681 Unsteadiness on feet: Secondary | ICD-10-CM | POA: Insufficient documentation

## 2023-10-28 DIAGNOSIS — M62830 Muscle spasm of back: Secondary | ICD-10-CM | POA: Insufficient documentation

## 2023-10-28 DIAGNOSIS — M5442 Lumbago with sciatica, left side: Secondary | ICD-10-CM | POA: Insufficient documentation

## 2023-10-28 DIAGNOSIS — M6281 Muscle weakness (generalized): Secondary | ICD-10-CM | POA: Diagnosis not present

## 2023-10-28 DIAGNOSIS — G8929 Other chronic pain: Secondary | ICD-10-CM | POA: Diagnosis not present

## 2023-10-28 NOTE — Therapy (Signed)
 OUTPATIENT PHYSICAL THERAPY NEURO EVALUATION   Patient Name: Destiny Ballard MRN: [id number] DOB:[birth year], 73 y.o., female Today's Date: 10/28/2023   END OF SESSION:  PT End of Session - 10/28/23 0808     Visit Number 2    Date for PT Re-Evaluation 01/05/24    Authorization Type Medicare & [employer]    Progress Note Due on Visit 10    PT Start Time 0801    PT Stop Time 0848    PT Time Calculation (min) 47 min    Activity Tolerance Patient tolerated treatment well    Behavior During Therapy [facility] for tasks assessed/performed              Past Medical History:  Diagnosis Date   Anxiety    Prior suicide attempt   CAD (coronary artery disease)    a) s/p DES to LAD 07/2005 b) Last Myoview low risk 11/2011 showing small fixed apical perfusion defect (prior MI vs attenuation) but no ischemia - normal EF.   Cervical spondylosis    Chest pain 12/10/2011   Chronic diastolic CHF (congestive heart failure) (HCC) 09/18/2017   Chronic eczematous otitis externa of both ears 11/18/2019   Chronic rhinitis 08/07/2016   Complication of anesthesia    developed pneumonia after nerve block was hospitalized   Constipation 12/18/2020   COPD with acute exacerbation (HCC) 04/25/2016   Coronary atherosclerosis 06/28/2008   Depression    Depression with anxiety    Diabetes mellitus (HCC) 09/08/2017   Diabetic neuropathy (HCC)    Dyspnea on exertion 09/02/2012   CXR 07/2012:  No acute process.     Eustachian tube dysfunction, bilateral 11/18/2019   GERD 06/28/2008   Hearing loss    Left   History of colonic polyps 12/18/2020   History of kidney stones    HLD (hyperlipidemia)    Hyperlipidemia associated with type 2 diabetes mellitus (HCC) 08/21/2020   Hypertension    Hypertension associated with diabetes (HCC) 01/30/2016   Hypokalemia 01/27/2016   Insulin resistance    Iron deficiency anemia    Leukocytosis    history of mild leukocytosis   Lobar pneumonia, unspecified organism (HCC)  11/13/2017   Melena 12/18/2020   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   Obesity (BMI 30-39.9) 08/24/2018   Last Assessment & Plan:  Exercise and weight reduction is encouraged.   OSA (obstructive sleep apnea) 05/06/2014   Pneumonia due to COVID-19 virus 08/21/2020   Precordial chest pain    Sciatic nerve disease, left    leg   Sepsis (HCC) 09/18/2017   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   Tinnitus of both ears 11/18/2019   Type 2 diabetes mellitus with hyperglycemia, with long-term current use of insulin (HCC) 12/10/2019   Uncontrolled type 2 diabetes mellitus with complication, with long-term current use of insulin 03/14/2020   Past Surgical History:  Procedure Laterality Date   BREAST ENHANCEMENT SURGERY     CARDIAC CATHETERIZATION  06/17/2007   NORMAL. EF 60%   CARDIAC CATHETERIZATION N/A 01/29/2016   Procedure: Left Heart Cath and Coronary Angiography;  Surgeon: Tonny Bollman, MD;  Location: [facility] INVASIVE CV LAB;  Service: Cardiovascular;  Laterality: N/A;   CERVICAL SPONDYLOSIS     SINGLE LEVEL FUSION   CHILDBIRTH     X3   COLONOSCOPY  2010   normal   COLONOSCOPY WITH PROPOFOL  10/01/2021   CORONARY STENT PLACEMENT  07/15/2005   LEFT ANTERIOR DESCENDING   FOREARM FRACTURE SURGERY  07/15/2008   hand and shoulder    INCISION AND DRAINAGE BREAST ABSCESS  01/05/2012       INCISION AND DRAINAGE PERIRECTAL ABSCESS N/A 02/18/2014   Procedure: IRRIGATION AND DEBRIDEMENT PERIRECTAL ABSCESS;  Surgeon: Azucena Bollard, MD;  Location: [facility] ORS;  Service: General;  Laterality: N/A;   LEFT HEART CATH AND CORONARY ANGIOGRAPHY N/A 10/10/2017   Procedure: LEFT HEART CATH AND CORONARY ANGIOGRAPHY;  Surgeon: Odie Benne, MD;  Location: [facility] INVASIVE CV LAB;  Service: Cardiovascular;  Laterality: N/A;   LUMBAR LAMINECTOMY     ORIF WRIST FRACTURE Right 11/27/2021   Procedure: OPEN REDUCTION INTERNAL FIXATION (ORIF) WRIST FRACTURE;   Surgeon: Osa Blase, MD;  Location: [facility] ORS;  Service: Orthopedics;  Laterality: Right;   ROTATOR CUFF REPAIR     bilaterla   TONSILLECTOMY AND ADENOIDECTOMY     TUBAL LIGATION     VIDEO BRONCHOSCOPY Bilateral 08/13/2016   Procedure: VIDEO BRONCHOSCOPY WITHOUT FLUORO;  Surgeon: Denson Flake, MD;  Location: [facility] ENDOSCOPY;  Service: Cardiopulmonary;  Laterality: Bilateral;   Patient Active Problem List   Diagnosis Date Noted   Diabetes mellitus (HCC) 03/01/2022   Hypoxia 11/27/2021   Osteopenia 06/27/2021   Melena 12/18/2020   History of colonic polyps 12/18/2020   Constipation 12/18/2020   Obesity    Iron deficiency anemia    Insulin resistance    Hyperlipidemia    Cervical spondylosis    Anxiety    Pneumonia due to COVID-19 virus 08/21/2020   Hyperlipidemia associated with type 2 diabetes mellitus (HCC) 08/21/2020   Type 2 diabetes mellitus with diabetic polyneuropathy, with long-term current use of insulin (HCC) 12/10/2019   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   Tinnitus of both ears 11/18/2019   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   Eustachian tube dysfunction, bilateral 11/18/2019   Chronic eczematous otitis externa of both ears 11/18/2019   Obesity (BMI 30-39.9) 08/24/2018   Depression with anxiety 08/24/2018   CAD (coronary artery disease) 08/24/2018   Abnormal cardiovascular stress test 08/24/2018   Precordial chest pain    Sepsis (HCC) 09/18/2017   Chronic diastolic CHF (congestive heart failure) (HCC) 09/18/2017   Chronic rhinitis 08/07/2016   COPD with acute exacerbation (HCC) 04/25/2016   Hypertension associated with diabetes (HCC) 01/30/2016   Diarrhea 09/26/2015   OSA (obstructive sleep apnea) 05/06/2014   Dyspnea on exertion 09/02/2012   GERD 06/28/2008    PCP: Kaylee Partridge, MD   REFERRING PROVIDER: Omega Bible, MD   REFERRING DIAG: R26.9 (ICD-10-CM) - Gait difficulty   PT evaluation; gait  diff; back pain   THERAPY DIAG:  Unsteadiness on feet  Chronic bilateral low back pain with left-sided sciatica  Muscle weakness (generalized)  Muscle spasm of back  RATIONALE FOR EVALUATION AND TREATMENT: Rehabilitation  ONSET DATE: 7-8 yrs for sciatic pain & balance issues  NEXT MD VISIT: none scheduled   SUBJECTIVE:  SUBJECTIVE STATEMENT: Pt denies any falls, any pain this morning.  Pt accompanied by: self  PAIN: Are you having pain? Yes: NPRS scale: 0/10 currently; up to 10/10 at worst  Pain location: B buttock, sciatica down L LE Pain description: sharp Aggravating factors: being on her feet  Relieving factors: sit, gabapentin   PERTINENT HISTORY:  Anxiety, CAD, CHF, COPD, cervical spondylosis s/p single level fusion, DM-II, diabetic neuropathy, depression, GERD, HTN, obesity, osteopenia/osteoporosis, lumbar laminectomy, ORIF R wrist fx 11/2021, vertigo  PRECAUTIONS: Fall  RED FLAGS: None  WEIGHT BEARING RESTRICTIONS: No  FALLS:  Has patient fallen in last 6 months? Yes. Number of falls 1  LIVING ENVIRONMENT: Lives with: lives with their daughter - dtr lives upstairs, pt lives on lower level Lives in: House/apartment - split level Stairs: Yes: Internal: 8 steps; no railings Has following equipment at home: Single point cane, Walker - 4 wheeled, and Grab bars  OCCUPATION: Retired  PLOF: Independent and Leisure: mostly sedentary   PATIENT GOALS: "For the pain to go away."   OBJECTIVE: (objective measures completed at initial evaluation unless otherwise dated)  DIAGNOSTIC FINDINGS:  08/04/23 - MR Brain IMPRESSION: 1. No acute intracranial process. No evidence of acute or subacute infarct. 2. Mildly advanced cerebral volume loss for age, with ex vacuo  dilatation of the ventricles. No disproportionate lobar atrophy.  11/21/21 - CT lumbar spine IMPRESSION: Minor acute superior endplate fracture at L2, more towards the left side, with loss of height of 10%. No retropulsed bone.   Mild degenerative disc disease and degenerative facet disease without compressive stenosis.  COGNITION: Overall cognitive status: Within functional limits for tasks assessed   SENSATION: WFL Diabetic neuropathy - Numbness and tingling in L>R foot  POSTURE:  rounded shoulders, forward head, decreased lumbar lordosis, and flexed trunk   PALPATION: TTP & increased muscle tension over L>R upper glutes; TTP over L greater trochanter  LUMBAR ROM:   Active  Eval  Flexion WFL  Extension WFL  Right lateral flexion WFL  Left lateral flexion WFL p!  Right rotation WFL  Left rotation WFL   (Blank rows = not tested)   LUMBAR SPECIAL TESTS:  Straight leg raise test: Negative and Slump test: Negative  MUSCLE LENGTH: Hamstrings: mild tight R, mod tight L ITB: mild tight B Piriformis: mild tight R, mod tight L Hip flexors: mild tight B Quads: mild tight L Heelcord: NT  LOWER EXTREMITY ROM:    Limited L hip ER  LOWER EXTREMITY MMT:    MMT Right eval Left eval  Hip flexion 4 4-  Hip extension 4 4-  Hip abduction 4 4-  Hip adduction 4+ 4  Hip internal rotation 4+ 4+  Hip external rotation 4- 4-  Knee flexion 4+ 4+  Knee extension 4+ 4+  Ankle dorsiflexion 4+ 4  Ankle plantarflexion    Ankle inversion    Ankle eversion    (Blank rows = not tested)  BED MOBILITY:  Independent  TRANSFERS: Assistive device utilized: None  Sit to stand: Complete Independence Stand to sit: Complete Independence Chair to chair: NT Floor:  NT  GAIT: Distance walked: clinic distances Assistive device utilized: None Level of assistance: Complete Independence Gait pattern: decreased stance time- Left, antalgic, and trunk flexed Comments:   STAIRS:  Level of  Assistance:  TBA  Stair Negotiation Technique:   Number of Stairs:    Height of Stairs:   Comments:   FUNCTIONAL TESTS: (Remaining tests TBA next visit) 5 times sit to  stand: 21.91 sec Timed up and go (TUG):   10 meter walk test: 11.59 sec; 2.83 ft/sec Berg Balance Scale:   Functional gait assessment:    PATIENT SURVEYS:  ABC scale 590 / 1600 = 36.9%; <50% indicates low level of physical functioning  Modified Oswestry 13 / 50 = 26.0%; moderate disability    TODAY'S TREATMENT:  10/28/23 Therapeutic Exercise: to improve strength, ROM, flexibility, and endurance  Nustep L2x49min UE/LE LTR both ways x 10 bil NEUROMUSCULAR RE-EDUCATION: To improve proprioception, balance, and kinesthesia. Supine PPT- patient started to feel like she needed to vomit Seated PPT 10x3" Seated PPT with hip ADD ball squeeze 10x3" Seated PPT with hip ABD RTB 10x3"  [facility] PT Assessment - 10/28/23 0001       Standardized Balance Assessment   Standardized Balance Assessment Berg Balance Test;Timed Up and Go Test      Berg Balance Test   Sit to Stand Able to stand  independently using hands    Standing Unsupported Able to stand safely 2 minutes    Sitting with Back Unsupported but Feet Supported on Floor or Stool Able to sit safely and securely 2 minutes    Stand to Sit Sits safely with minimal use of hands    Transfers Able to transfer safely, minor use of hands    Standing Unsupported with Eyes Closed Able to stand 10 seconds safely    Standing Unsupported with Feet Together Able to place feet together independently and stand 1 minute safely    From Standing, Reach Forward with Outstretched Arm Can reach forward >12 cm safely (5")    From Standing Position, Pick up Object from Floor Able to pick up shoe safely and easily    From Standing Position, Turn to Look Behind Over each Shoulder Turn sideways only but maintains balance    Turn 360 Degrees Able to turn 360 degrees safely in 4 seconds or less     Standing Unsupported, Alternately Place Feet on Step/Stool Able to stand independently and complete 8 steps >20 seconds    Standing Unsupported, One Foot in Front Able to take small step independently and hold 30 seconds    Standing on One Leg Able to lift leg independently and hold 5-10 seconds    Total Score 48             10/13/2023  SELF CARE:  Reviewed eval findings and role of PT in addressing identified deficits as well as need for further assessment.  PATIENT EDUCATION:  Education details: initial HEP  Person educated: Patient Education method: Explanation Education comprehension: verbalized understanding  HOME EXERCISE PROGRAM: Access Code: [email or web address] URL: [email or web address] Date: 10/28/2023 Prepared by: Dovie Gell  Exercises - Seated Pelvic Tilt  - 1 x daily - 7 x weekly - 2 sets - 10 reps - 3 sec hold - Seated Hip Adduction Isometrics with Ball  - 1 x daily - 7 x weekly - 2 sets - 10 reps - 3 sec hold - Seated Hip Abduction with Resistance  - 1 x daily - 7 x weekly - 2 sets - 10 reps - 3 sec hold   ASSESSMENT:  CLINICAL IMPRESSION: Progressed patient through interventions for lumbo-pelvic mobility and strength. Worked on engaging her core with hip strengthening. She became nauseous while lying supine and reports that she has had ringing in her ears for years, she may need a vestibular assessment in future visits. Further balance assessment pending with TUG and FGA. BERG score today shows a  moderate risk for falls.  Destiny "Candice Chalet" will benefit from skilled PT to address above deficits to improve mobility and activity tolerance to help reach the maximal level of functional independence and mobility. Patient demonstrates understanding of this POC and is in agreement with this plan.   OBJECTIVE IMPAIRMENTS: Abnormal gait, decreased activity tolerance, decreased balance, decreased knowledge of condition, decreased mobility, difficulty walking, decreased  ROM, decreased strength, dizziness, impaired perceived functional ability, increased muscle spasms, impaired flexibility, impaired sensation, improper body mechanics, postural dysfunction, and pain.   ACTIVITY LIMITATIONS: carrying, lifting, bending, standing, squatting, sleeping, stairs, transfers, bathing, dressing, and locomotion level  PARTICIPATION LIMITATIONS: meal prep, cleaning, laundry, driving, shopping, and community activity  PERSONAL FACTORS: Age, Fitness, Past/current experiences, Time since onset of injury/illness/exacerbation, and 3+ comorbidities: Anxiety, CAD, CHF, COPD, cervical spondylosis s/p single level fusion, DM-II, diabetic neuropathy, depression, GERD, HTN, obesity, osteopenia/osteoporsis, lumbar laminectomy, ORIF R wrist fx 11/2021  are also affecting patient's functional outcome.   REHAB POTENTIAL: Good  CLINICAL DECISION MAKING: Evolving/moderate complexity  EVALUATION COMPLEXITY: Moderate   GOALS: Goals reviewed with patient? Yes  SHORT TERM GOALS: Target date: 11/10/2023  Patient will be independent with initial HEP to improve outcomes and carryover.  Baseline:  Goal status: IN PROGRESS  2.  Patient will be educated on strategies to decrease risk of falls.  Baseline:  Goal status: IN PROGRESS  3.  Patient will improve 5xSTS time to </= 15 seconds for improved efficiency and safety with transfers. Baseline: 21.91 sec Goal status: IN PROGRESS  LONG TERM GOALS: Target date: 01/05/2024  Patient will be independent with advanced/ongoing HEP to facilitate ability to maintain/progress functional gains from skilled physical therapy services. Baseline:  Goal status: IN PROGRESS  2.  Patient will be able to ambulate 600' w/o LRAD on variable surfaces with good safety to access community.  Baseline:  Goal status: IN PROGRESS  3.  Patient will be able to step up/down curb safely with LRAD for safety with community ambulation.  Baseline:  Goal status: IN  PROGRESS   4.  Patient will demonstrate improved B LE strength to >/= 4+/5 for improved stability and ease of mobility . Baseline: Refer to above LE MMT table Goal status: IN PROGRESS  5.  Patient will demonstrate decreased TUG time to </= 13.5 sec to decrease risk for falls with transitional mobility. Baseline: TBA Goal status: IN PROGRESS   6.  Patient will improve Berg score by at least 8 points to improve safety and stability with ADLs in standing and reduce risk for falls. (MCID= 8 points)  Baseline: TBA Goal status: IN PROGRESS  7.  Patient will improve FGA score to at least 19/30 to improve gait stability and reduce risk for falls. Baseline: TBA Goal status: IN PROGRESS  8.  Patient will report >/= 50% on ABC scale (MCID = 19%) to demonstrate improved balance confidence with functional mobility and gait. Baseline: 590 / 1600 = 36.9 % Goal status: IN PROGRESS  9.  Patient will report </= 14% on Modified Oswestry (MCID = 12%) to demonstrate improved functional ability with decreased pain interference. Baseline: 13 / 50 = 26.0 % Goal status: IN PROGRESS   PLAN:  PT FREQUENCY: 2x/week  PT DURATION: 8-12 weeks  PLANNED INTERVENTIONS: 97164- PT Re-evaluation, 97110-Therapeutic exercises, 97530- Therapeutic activity, [id number]- Neuromuscular re-education, 97535- Self Care, [id number]- Manual therapy, [id number]- Gait training, [phone]- Canalith repositioning, [id number]- Aquatic Therapy, V2536- Electrical stimulation (unattended), [id number]- Electrical stimulation (manual), [id number]- Ultrasound, [id number]- Traction (mechanical), [id number]- Ionotophoresis  4mg /ml Dexamethasone, Patient/Family education, Balance training, Stair training, Taping, Dry Needling, Joint mobilization, Spinal mobilization, Vestibular training, Visual/preceptual remediation/compensation, Cryotherapy, Moist heat, and 95621- Physical Performance Test or Measurement  PLAN FOR NEXT SESSION: Complete balance assessment - TUG & FGA; initial HEP for  lumbopelvic flexibility and strengthening; MT +/- TPDN to address abnormal muscle tension/tightness and pain; Possible vestibular assessment as indicated   Samuella Crocker, PTA 10/28/2023, 8:49 AM

## 2023-10-30 ENCOUNTER — Ambulatory Visit (INDEPENDENT_AMBULATORY_CARE_PROVIDER_SITE_OTHER): Payer: Medicare Other

## 2023-10-30 VITALS — Ht 59.0 in | Wt 165.0 lb

## 2023-10-30 DIAGNOSIS — Z0000 Encounter for general adult medical examination without abnormal findings: Secondary | ICD-10-CM

## 2023-10-30 NOTE — Progress Notes (Signed)
 Complex Care Management Care Guide Note  10/30/2023 Name: KAYLANNI EZELLE MRN: [id number] DOB: 1950-07-20  ROSELYNE STALNAKER is a 73 y.o. year old female who is a primary care patient of Copland, Jessica C, MD and is actively engaged with the care management team. I reached out to Burton Casey by phone today to assist with provider appointment scheduling  with the Pharmacist.  Follow up plan: Telephone appointment with complex care management team member scheduled for:  11/17/23 at 9:00 a.m.   [facility]  [id number] [facility], [facility] Care Management Assistant Direct Dial: [phone]  Fax: [phone]

## 2023-10-30 NOTE — Patient Instructions (Addendum)
 Destiny Ballard , Thank you for taking time to come for your Medicare Wellness Visit. I appreciate your ongoing commitment to your health goals. Please review the following plan we discussed and let me know if I can assist you in the future.   Referrals/Orders/Follow-Ups/Clinician Recommendations:   This is a list of the screening recommended for you and due dates:  Health Maintenance  Topic Date Due   COVID-19 Vaccine (3 - 2024-25 season) 03/16/2023   Complete foot exam   09/18/2023   Mammogram  07/29/2024*   Hemoglobin A1C  01/27/2024   Flu Shot  02/13/2024   Yearly kidney function blood test for diabetes  07/29/2024   Yearly kidney health urinalysis for diabetes  07/29/2024   Eye exam for diabetics  08/15/2024   Medicare Annual Wellness Visit  10/29/2024   DTaP/Tdap/Td vaccine (3 - Td or Tdap) 03/04/2029   Colon Cancer Screening  10/02/2031   Pneumonia Vaccine  Completed   DEXA scan (bone density measurement)  Completed   Hepatitis C Screening  Completed   Zoster (Shingles) Vaccine  Completed   HPV Vaccine  Aged Out   Meningitis B Vaccine  Aged Out  *Topic was postponed. The date shown is not the original due date.    Advanced directives: (Copy Requested) Please bring a copy of your health care power of attorney and living will to the office to be added to your chart at your convenience. You can mail to [facility] [address] [place], [place] [address] or email to [email or web address]  Next Medicare Annual Wellness Visit scheduled for next year: Yes

## 2023-10-30 NOTE — Progress Notes (Signed)
 Subjective:   Destiny Ballard is a 73 y.o. who presents for a Medicare Wellness preventive visit.  Visit Complete: Virtual I connected with  Burton Casey on 10/30/23 by a audio enabled telemedicine application and verified that I am speaking with the correct person using two identifiers.  Patient Location: Home  Provider Location: Home Office  I discussed the limitations of evaluation and management by telemedicine. The patient expressed understanding and agreed to proceed.  Vital Signs: Because this visit was a virtual/telehealth visit, some criteria may be missing or patient reported. Any vitals not documented were not able to be obtained and vitals that have been documented are patient reported.    Persons Participating in Visit: Patient.  AWV Questionnaire: No: Patient Medicare AWV questionnaire was not completed prior to this visit.  Cardiac Risk Factors include: advanced age (>28men, >10 women);diabetes mellitus;hypertension     Objective:    Today's Vitals   10/30/23 0852  Weight: 165 lb (74.8 kg)  Height: 4\' 11"  (1.499 m)   Body mass index is 33.33 kg/m.     10/30/2023    9:03 AM 10/17/2023    9:35 AM 10/13/2023    8:09 AM 11/10/2022    2:32 PM 10/25/2022    9:07 AM 11/27/2021   11:32 AM 11/23/2021    3:22 PM  Advanced Directives  Does Patient Have a Medical Advance Directive? Yes Yes Yes Yes Yes Yes Yes  Type of Estate agent of [place];Living will  Healthcare Power of [place];Living will Healthcare Power of [place];Living will Healthcare Power of [place];Living will Living will Living will  Does patient want to make changes to medical advance directive?  No - Patient declined No - Patient declined  No - Patient declined No - Patient declined No - Patient declined  Copy of Healthcare Power of Attorney in Chart? No - copy requested    No - copy requested No - copy requested No - copy requested    Current Medications  (verified) Outpatient Encounter Medications as of 10/30/2023  Medication Sig   albuterol (VENTOLIN HFA) 108 (90 Base) MCG/ACT inhaler Inhale 2 puffs into the lungs every 8 (eight) hours as needed for wheezing or shortness of breath.   aspirin EC 81 MG tablet Take 81 mg by mouth daily.   calcium carbonate (TUMS EX) 750 MG chewable tablet Chew 1 tablet by mouth daily as needed for heartburn.   chlorhexidine (PERIDEX) 0.12 % solution SMARTSIG:0.5 Ounce(s) By Mouth Twice Daily   cholecalciferol (VITAMIN D3) 25 MCG (1000 UNIT) tablet Take 2,000 Units by mouth daily.   clindamycin (CLEOCIN) 300 MG capsule Take 300 mg by mouth 2 (two) times daily.   Continuous Glucose Receiver (FREESTYLE LIBRE 3 READER) DEVI by Does not apply route. Free style brand Use to check blood glucose continuously. Change sensor every 10 day as of 11/18/22   Continuous Glucose Sensor (FREESTYLE LIBRE 3 SENSOR) MISC by Does not apply route. Free style brand Use to check blood glucose continuously. Change sensor every 10 day as of 11/18/22   cyanocobalamin (VITAMIN B12) 1000 MCG tablet Take 3,000 mcg by mouth daily.   dapagliflozin propanediol (FARXIGA) 10 MG TABS tablet Take 1 tablet (10 mg total) by mouth daily.   Dulaglutide (TRULICITY) 0.75 MG/0.5ML SOAJ Inject 0.75 mg into the skin once a week. Take for 4 weeks, then increase to 1.5mg  weeky   Dulaglutide (TRULICITY) 1.5 MG/0.5ML SOAJ Inject 1.5 mg into the skin once a week. (Patient not taking:  Reported on 09/29/2023)   famotidine (PEPCID) 40 MG tablet Take 1 tablet (40 mg total) by mouth 2 (two) times daily.   fexofenadine (ALLEGRA) 180 MG tablet Take 180 mg by mouth daily.   FLUoxetine (PROZAC) 40 MG capsule TAKE 1 CAPSULE (40 MG TOTAL) BY MOUTH DAILY.   furosemide (LASIX) 20 MG tablet Take 1 tablet (20 mg total) by mouth daily. For swelling / fluid retention (Patient taking differently: Take 20 mg by mouth daily as needed for edema. For swelling / fluid retention)   gabapentin  (NEURONTIN) 300 MG capsule Take 2 capsules (600 mg total) by mouth 2 (two) times daily.   hydrALAZINE (APRESOLINE) 25 MG tablet TAKE 2 TABLETS BY MOUTH 2 TIMES DAILY. FOR BLOOD PRESSURE (Patient taking differently: Take 25 mg by mouth in the morning and at bedtime.)   Insulin Glargine (BASAGLAR KWIKPEN) 100 UNIT/ML Inject 30 Units into the skin daily.   Insulin Pen Needle 32G X 4 MM MISC 1 Device by Does not apply route daily.   Lancets (FREESTYLE) lancets Use to check blood glucose once a day (Patient not taking: Reported on 10/20/2023)   metFORMIN (GLUCOPHAGE-XR) 500 MG 24 hr tablet Take 2 tablets (1,000 mg total) by mouth 2 (two) times daily.   metoprolol succinate (TOPROL-XL) 100 MG 24 hr tablet Take 1 tablet (100 mg total) by mouth daily.   montelukast (SINGULAIR) 10 MG tablet Take 1 tablet (10 mg total) by mouth at bedtime.   nitroGLYCERIN (NITROSTAT) 0.4 MG SL tablet Place 1 tablet (0.4 mg total) under the tongue every 5 (five) minutes as needed for chest pain.   OVER THE COUNTER MEDICATION Take 1 each by mouth daily. CBD gummy (Patient not taking: Reported on 10/20/2023)   pantoprazole (PROTONIX) 40 MG tablet TAKE 1 TABLET BY MOUTH TWICE A DAY   rosuvastatin (CRESTOR) 20 MG tablet Take 1 tablet (20 mg total) by mouth daily.   Tiotropium Bromide Monohydrate (SPIRIVA RESPIMAT) 2.5 MCG/ACT AERS INHALE 2 PUFFS BY MOUTH INTO THE LUNGS DAILY (Patient not taking: Reported on 10/20/2023)   valsartan (DIOVAN) 160 MG tablet TAKE 1 TABLET BY MOUTH EVERY DAY   zinc gluconate 50 MG tablet Take 50 mg by mouth daily. (Patient not taking: Reported on 10/13/2023)   No facility-administered encounter medications on file as of 10/30/2023.    Allergies (verified) Prednisone   History: Past Medical History:  Diagnosis Date   Anxiety    Prior suicide attempt   CAD (coronary artery disease)    a) s/p DES to LAD 07/2005 b) Last Myoview low risk 11/2011 showing small fixed apical perfusion defect (prior MI vs  attenuation) but no ischemia - normal EF.   Cervical spondylosis    Chest pain 12/10/2011   Chronic diastolic CHF (congestive heart failure) (HCC) 09/18/2017   Chronic eczematous otitis externa of both ears 11/18/2019   Chronic rhinitis 08/07/2016   Complication of anesthesia    developed pneumonia after nerve block was hospitalized   Constipation 12/18/2020   COPD with acute exacerbation (HCC) 04/25/2016   Coronary atherosclerosis 06/28/2008   Depression    Depression with anxiety    Diabetes mellitus (HCC) 09/08/2017   Diabetic neuropathy (HCC)    Dyspnea on exertion 09/02/2012   CXR 07/2012:  No acute process.     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 EF 60%   CARDIAC CATHETERIZATION N/A 01/29/2016   Procedure: Left Heart Cath and Coronary Angiography;  Surgeon: Tonny Bollman, MD;  [facility] INVASIVE CV LAB;  Service: Cardiovascular;  Laterality: N/A;   CERVICAL SPONDYLOSIS     SINGLE LEVEL FUSION   CHILDBIRTH     X3   COLONOSCOPY  2010   normal   COLONOSCOPY WITH PROPOFOL  10/01/2021   CORONARY STENT PLACEMENT  07/15/2005   LEFT ANTERIOR DESCENDING   FOREARM FRACTURE SURGERY  07/15/2008   hand and shoulder    INCISION AND DRAINAGE BREAST ABSCESS  01/05/2012       INCISION AND DRAINAGE PERIRECTAL ABSCESS N/A 02/18/2014   Procedure: IRRIGATION AND DEBRIDEMENT PERIRECTAL ABSCESS;  Surgeon: Valarie Merino, MD;  Location: [facility] ORS;  Service: General;  Laterality: N/A;   LEFT HEART CATH AND CORONARY ANGIOGRAPHY N/A 10/10/2017   Procedure: LEFT HEART CATH AND CORONARY ANGIOGRAPHY;  Surgeon: Kathleene Hazel, MD;  Location: [facility] INVASIVE CV LAB;  Service: Cardiovascular;  Laterality: N/A;   LUMBAR LAMINECTOMY     ORIF WRIST FRACTURE Right 11/27/2021   Procedure: OPEN REDUCTION INTERNAL FIXATION (ORIF) WRIST FRACTURE;  Surgeon: Teryl Lucy, MD;  Location: [facility] ORS;  Service: Orthopedics;  Laterality: Right;   ROTATOR CUFF REPAIR     bilaterla   TONSILLECTOMY AND ADENOIDECTOMY     TUBAL LIGATION     VIDEO BRONCHOSCOPY Bilateral 08/13/2016   Procedure: VIDEO BRONCHOSCOPY WITHOUT FLUORO;  Surgeon: Leslye Peer, MD;  Location: [facility] ENDOSCOPY;  Service: Cardiopulmonary;  Laterality: Bilateral;   Family History  Problem Relation Age of Onset   Heart attack Mother    Diabetes Mother    Lung cancer Mother    Asthma Mother    Heart disease Mother    Suicidality Father        "killed himself"   Diabetes Sister    Cancer Sister    Colon cancer Sister 37   Asthma Daughter        x 2   Cancer Daughter        pre-cancerous polyp   Cervical cancer Daughter        cervical    Allergies Other         all family--seasonal allergies   Social History   Socioeconomic History   Marital status:  Widowed    Spouse name: Gerri Spore   Number of children: 3   Years of education: 12   Highest education level: Not on file  Occupational History   Occupation: retired from [employer]    Comment: 11/2012  Tobacco Use   Smoking status: Never    Passive exposure: Yes   Smokeless tobacco: Never  Vaping Use   Vaping status: Never Used  Substance and Sexual Activity   Alcohol use: Yes    Comment: occ   Drug use: No   Sexual activity: Not Currently    Partners: Male    Birth control/protection: None  Other Topics Concern   Not on file  Social History Narrative   Their eldest daughter lives upstairs.      Pt lives with husband at home. Pt drives.    Social Drivers of Corporate investment banker Strain: Low Risk  (10/30/2023)   Overall Financial Resource Strain (CARDIA)    Difficulty of Paying Living Expenses: Not hard at all  Food Insecurity: No Food Insecurity (10/30/2023)   Hunger Vital Sign    Worried About Running Out of Food in the Last Year: Never true    Ran Out of Food in the Last Year: Never true  Transportation Needs: No Transportation Needs (10/30/2023)   PRAPARE - Administrator, Civil Service (Medical): No    Lack of Transportation (Non-Medical): No  Physical Activity: Inactive (10/30/2023)   Exercise Vital Sign    Days of Exercise per Week: 0 days    Minutes of Exercise per Session: 0 min  Stress: No Stress Concern Present (10/30/2023)   [employer] of Occupational Health - Occupational Stress Questionnaire    Feeling of Stress : Only a little  Social Connections: Socially Isolated (10/30/2023)   Social Connection and Isolation Panel [NHANES]    Frequency of Communication with Friends and Family: More than three times a week    Frequency of Social Gatherings with Friends and Family: More than three times a week    Attends Religious Services: Never    Automotive engineer or Organizations: No    Attends Banker Meetings: Never    Marital Status: Widowed    Tobacco Counseling Counseling given: Not Answered    Clinical Intake:  Pre-visit preparation completed: Yes  Pain : No/denies pain     BMI - recorded: 33.33 Nutritional Status: BMI > 30  Obese Nutritional Risks: None Diabetes: Yes CBG done?: No Did pt. bring in CBG monitor from home?: No  Lab Results  Component Value Date   HGBA1C 8.4 (H) 07/30/2023   HGBA1C 6.9 (H) 02/03/2023   HGBA1C 7.2 (H) 09/18/2022     How often do you need to have someone help you when you read instructions, pamphlets, or other written materials from your doctor or pharmacy?: 1 - Never  Interpreter Needed?: No  Information entered by :: Theresa Mulligan LPN   Activities of Daily Living      10/30/2023    9:00 AM  In your present state of health, do you have any difficulty performing the following activities:  Hearing? 0  Vision? 0  Difficulty concentrating or making decisions? 0  Walking or climbing stairs? 0  Dressing or bathing? 0  Doing errands, shopping? 0  Preparing Food and eating ? N  Using the Toilet? N  In the past six months, have you accidently leaked urine? N  Do you have problems with loss of bowel control?  N  Managing your Medications? N  Managing your Finances? N  Housekeeping or managing your Housekeeping? N    Patient Care Team: Copland, Gwenlyn Found, MD as PCP - General (Family Medicine) Olivia Mackie, MD as Consulting Physician (Obstetrics and Gynecology) Henrene Pastor, RPH-CPP (Pharmacist)  Indicate any recent Medical Services you may have received from other than [facility] providers in the past year (date may be approximate).     Assessment:   This is a routine wellness examination for [place].  Hearing/Vision screen Hearing Screening - Comments:: Denies hearing difficulties   Vision Screening - Comments:: Wears reading glasses - up to date with  routine eye exams with  Dr Emily Filbert   Goals Addressed               This Visit's Progress     Increase physical activity (pt-stated)        I want to start walking.       Depression Screen      10/30/2023    8:58 AM 10/17/2023    9:35 AM 02/03/2023   10:39 AM 10/25/2022    9:12 AM 09/18/2022    8:11 AM 05/20/2022    2:40 PM 11/12/2021    3:45 PM  PHQ 2/9 Scores  PHQ - 2 Score 0 0 0 6 3 6  0  PHQ- 9 Score   0 6 10 16  0    Fall Risk      10/30/2023    9:01 AM 10/17/2023    9:36 AM 10/17/2023    9:35 AM 07/30/2023    9:15 AM 02/03/2023   10:39 AM  Fall Risk   Falls in the past year? 1 0 0 1 0  Number falls in past yr: 0 0 0 0 0  Injury with Fall? 1 0  0 0  Comment Fx: Rt Wrist. Followed by medical attention      Risk for fall due to : No Fall Risks   Impaired balance/gait No Fall Risks  Follow up Falls prevention discussed;Falls evaluation completed   Falls prevention discussed Falls evaluation completed    MEDICARE RISK AT HOME:   Medicare Risk at Home Any stairs in or around the home?: No If so, are there any without handrails?: No Home free of loose throw rugs in walkways, pet beds, electrical cords, etc?: Yes Adequate lighting in your home to reduce risk of falls?: Yes Life alert?: No Use of a cane, walker or w/c?: No Grab bars in the bathroom?: Yes Shower chair or bench in shower?: Yes Elevated toilet seat or a handicapped toilet?: No  TIMED UP AND GO:  Was the test performed?  No  Cognitive Function: 6CIT completed    09/01/2017    3:13 PM  MMSE - Mini Mental State Exam  Orientation to time 5  Orientation to Place 5  Registration 3  Attention/ Calculation 5  Recall 3  Language- name 2 objects 2  Language- repeat 1  Language- follow 3 step command 3  Language- read & follow direction 1  Write a sentence 1  Copy design 1  Total score 30        10/30/2023    9:03 AM 10/25/2022    9:09 AM  6CIT Screen  What Year? 0 points 0 points  What month? 0  points 0 points  What time? 0 points 0 points  Count back from 20 0 points 0 points  Months in reverse 0 points 0 points  Repeat phrase 0  points 2 points  Total Score 0 points 2 points    Immunizations Immunization History  Administered Date(s) Administered   Fluad Quad(high Dose 65+) 09/04/2020, 05/20/2022   Influenza Whole 04/14/2010   Influenza, High Dose Seasonal PF 04/03/2017, 05/11/2018, 03/05/2019, 03/12/2021   Influenza,inj,Quad PF,6+ Mos 05/10/2013, 04/05/2014, 05/22/2015, 04/25/2016   Influenza-Unspecified 07/29/2023   PFIZER Comirnaty(Gray Top)Covid-19 Tri-Sucrose Vaccine 01/11/2021, 02/01/2021   Pneumococcal Conjugate-13 07/24/2016   Pneumococcal Polysaccharide-23 04/05/2014, 05/22/2015   Tdap 07/11/2014, 03/05/2019   Zoster Recombinant(Shingrix) 03/05/2019, 06/05/2019    Screening Tests Health Maintenance  Topic Date Due   COVID-19 Vaccine (3 - 2024-25 season) 03/16/2023   FOOT EXAM  09/18/2023   MAMMOGRAM  07/29/2024 (Originally 01/17/2021)   HEMOGLOBIN A1C  01/27/2024   INFLUENZA VACCINE  02/13/2024   Diabetic kidney evaluation - eGFR measurement  07/29/2024   Diabetic kidney evaluation - Urine ACR  07/29/2024   OPHTHALMOLOGY EXAM  08/15/2024   Medicare Annual Wellness (AWV)  10/29/2024   DTaP/Tdap/Td (3 - Td or Tdap) 03/04/2029   Colonoscopy  10/02/2031   Pneumonia Vaccine 105+ Years old  Completed   DEXA SCAN  Completed   Hepatitis C Screening  Completed   Zoster Vaccines- Shingrix  Completed   HPV VACCINES  Aged Out   Meningococcal B Vaccine  Aged Out    Health Maintenance  Health Maintenance Due  Topic Date Due   COVID-19 Vaccine (3 - 2024-25 season) 03/16/2023   FOOT EXAM  09/18/2023   Health Maintenance Items Addressed: Foot Exam deferred  Additional Screening:  Vision Screening: Recommended annual ophthalmology exams for early detection of glaucoma and other disorders of the eye.  Dental Screening: Recommended annual dental exams for  proper oral hygiene  Community Resource Referral / Chronic Care Management: CRR required this visit?  No   CCM required this visit?  No     Plan:     I have personally reviewed and noted the following in the patient's chart:   Medical and social history Use of alcohol, tobacco or illicit drugs  Current medications and supplements including opioid prescriptions. Patient is not currently taking opioid prescriptions. Functional ability and status Nutritional status Physical activity Advanced directives List of other physicians Hospitalizations, surgeries, and ER visits in previous 12 months Vitals Screenings to include cognitive, depression, and falls Referrals and appointments  In addition, I have reviewed and discussed with patient certain preventive protocols, quality metrics, and best practice recommendations. A written personalized care plan for preventive services as well as general preventive health recommendations were provided to patient.     Dewayne Ford, LPN   [id number]/61/0960   After Visit Summary: (MyChart) Due to this being a telephonic visit, the after visit summary with patients personalized plan was offered to patient via MyChart   Notes: Nothing significant to report at this time.

## 2023-11-03 ENCOUNTER — Ambulatory Visit

## 2023-11-03 DIAGNOSIS — G8929 Other chronic pain: Secondary | ICD-10-CM | POA: Diagnosis not present

## 2023-11-03 DIAGNOSIS — M6281 Muscle weakness (generalized): Secondary | ICD-10-CM

## 2023-11-03 DIAGNOSIS — R2681 Unsteadiness on feet: Secondary | ICD-10-CM

## 2023-11-03 DIAGNOSIS — M62830 Muscle spasm of back: Secondary | ICD-10-CM | POA: Diagnosis not present

## 2023-11-03 DIAGNOSIS — M5442 Lumbago with sciatica, left side: Secondary | ICD-10-CM | POA: Diagnosis not present

## 2023-11-03 NOTE — Therapy (Signed)
 OUTPATIENT PHYSICAL THERAPY NEURO TREATMENT   Patient Name: Destiny Ballard MRN: [id number] DOB:11-05-1950, 73 y.o., female Today's Date: 11/03/2023   END OF SESSION:  PT End of Session - 11/03/23 0811     Visit Number 3    Date for PT Re-Evaluation 01/05/24    Authorization Type Medicare & [employer]    Progress Note Due on Visit 10    PT Start Time 0804    PT Stop Time 0853    PT Time Calculation (min) 49 min    Activity Tolerance Patient tolerated treatment well    Behavior During Therapy [facility] for tasks assessed/performed               Past Medical History:  Diagnosis Date   Anxiety    Prior suicide attempt   CAD (coronary artery disease)    a) s/p DES to LAD 07/2005 b) Last Myoview  low risk 11/2011 showing small fixed apical perfusion defect (prior MI vs attenuation) but no ischemia - normal EF.   Cervical spondylosis    Chest pain 12/10/2011   Chronic diastolic CHF (congestive heart failure) (HCC) 09/18/2017   Chronic eczematous otitis externa of both ears 11/18/2019   Chronic rhinitis 08/07/2016   Complication of anesthesia    developed pneumonia after nerve block was hospitalized   Constipation 12/18/2020   COPD with acute exacerbation (HCC) 04/25/2016   Coronary atherosclerosis 06/28/2008   Depression    Depression with anxiety    Diabetes mellitus (HCC) 09/08/2017   Diabetic neuropathy (HCC)    Dyspnea on exertion 09/02/2012   CXR 07/2012:  No acute process.     Eustachian tube dysfunction, bilateral 11/18/2019   GERD 06/28/2008   Hearing loss    Left   History of colonic polyps 12/18/2020   History of kidney stones    HLD (hyperlipidemia)    Hyperlipidemia associated with type 2 diabetes mellitus (HCC) 08/21/2020   Hypertension    Hypertension associated with diabetes (HCC) 01/30/2016   Hypokalemia 01/27/2016   Insulin  resistance    Iron deficiency anemia    Leukocytosis    history of mild leukocytosis   Lobar pneumonia, unspecified organism (HCC)  11/13/2017   Melena 12/18/2020   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   Obesity (BMI 30-39.9) 08/24/2018   Last Assessment & Plan:  Exercise and weight reduction is encouraged.   OSA (obstructive sleep apnea) 05/06/2014   Pneumonia due to COVID-19 virus 08/21/2020   Precordial chest pain    Sciatic nerve disease, left    leg   Sepsis (HCC) 09/18/2017   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   Tinnitus of both ears 11/18/2019   Type 2 diabetes mellitus with hyperglycemia, with long-term current use of insulin  (HCC) 12/10/2019   Uncontrolled type 2 diabetes mellitus with complication, with long-term current use of insulin  03/14/2020   Past Surgical History:  Procedure Laterality Date   BREAST ENHANCEMENT SURGERY     CARDIAC CATHETERIZATION  06/17/2007   NORMAL. EF 60%   CARDIAC CATHETERIZATION N/A 01/29/2016   Procedure: Left Heart Cath and Coronary Angiography;  Surgeon: Arnoldo Lapping, MD;  Location: [facility] INVASIVE CV LAB;  Service: Cardiovascular;  Laterality: N/A;   CERVICAL SPONDYLOSIS     SINGLE LEVEL FUSION   CHILDBIRTH     X3   COLONOSCOPY  2010   normal   COLONOSCOPY WITH PROPOFOL   10/01/2021   CORONARY STENT PLACEMENT  07/15/2005   LEFT ANTERIOR DESCENDING   FOREARM FRACTURE SURGERY  07/15/2008   hand and shoulder    INCISION AND DRAINAGE BREAST ABSCESS  01/05/2012       INCISION AND DRAINAGE PERIRECTAL ABSCESS N/A 02/18/2014   Procedure: IRRIGATION AND DEBRIDEMENT PERIRECTAL ABSCESS;  Surgeon: Azucena Bollard, MD;  Location: [facility] ORS;  Service: General;  Laterality: N/A;   LEFT HEART CATH AND CORONARY ANGIOGRAPHY N/A 10/10/2017   Procedure: LEFT HEART CATH AND CORONARY ANGIOGRAPHY;  Surgeon: Odie Benne, MD;  Location: [facility] INVASIVE CV LAB;  Service: Cardiovascular;  Laterality: N/A;   LUMBAR LAMINECTOMY     ORIF WRIST FRACTURE Right 11/27/2021   Procedure: OPEN REDUCTION INTERNAL FIXATION (ORIF) WRIST FRACTURE;   Surgeon: Osa Blase, MD;  Location: [facility];  Service: Orthopedics;  Laterality: Right;   ROTATOR CUFF REPAIR     bilaterla   TONSILLECTOMY AND ADENOIDECTOMY     TUBAL LIGATION     VIDEO BRONCHOSCOPY Bilateral 08/13/2016   Procedure: VIDEO BRONCHOSCOPY WITHOUT FLUORO;  Surgeon: Denson Flake, MD;  Location: [facility] ENDOSCOPY;  Service: Cardiopulmonary;  Laterality: Bilateral;   Patient Active Problem List   Diagnosis Date Noted   Diabetes mellitus (HCC) 03/01/2022   Hypoxia 11/27/2021   Osteopenia 06/27/2021   Melena 12/18/2020   History of colonic polyps 12/18/2020   Constipation 12/18/2020   Obesity    Iron deficiency anemia    Insulin  resistance    Hyperlipidemia    Cervical spondylosis    Anxiety    Pneumonia due to COVID-19 virus 08/21/2020   Hyperlipidemia associated with type 2 diabetes mellitus (HCC) 08/21/2020   Type 2 diabetes mellitus with diabetic polyneuropathy, with long-term current use of insulin  (HCC) 12/10/2019   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   Tinnitus of both ears 11/18/2019   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   Eustachian tube dysfunction, bilateral 11/18/2019   Chronic eczematous otitis externa of both ears 11/18/2019   Obesity (BMI 30-39.9) 08/24/2018   Depression with anxiety 08/24/2018   CAD (coronary artery disease) 08/24/2018   Abnormal cardiovascular stress test 08/24/2018   Precordial chest pain    Sepsis (HCC) 09/18/2017   Chronic diastolic CHF (congestive heart failure) (HCC) 09/18/2017   Chronic rhinitis 08/07/2016   COPD with acute exacerbation (HCC) 04/25/2016   Hypertension associated with diabetes (HCC) 01/30/2016   Diarrhea 09/26/2015   OSA (obstructive sleep apnea) 05/06/2014   Dyspnea on exertion 09/02/2012   GERD 06/28/2008    PCP: Kaylee Partridge, MD   REFERRING PROVIDER: Omega Bible, MD   REFERRING DIAG: R26.9 (ICD-10-CM) - Gait difficulty   PT evaluation; gait  diff; back pain   THERAPY DIAG:  Unsteadiness on feet  Chronic bilateral low back pain with left-sided sciatica  Muscle weakness (generalized)  Muscle spasm of back  RATIONALE FOR EVALUATION AND TREATMENT: Rehabilitation  ONSET DATE: 7-8 yrs for sciatic pain & balance issues  NEXT MD VISIT: none scheduled   SUBJECTIVE:  SUBJECTIVE STATEMENT: "Walked a lot yesterday, feeling some pain down her L posterior thigh."  Pt accompanied by: self  PAIN: Are you having pain? Yes: NPRS scale: 5/10  Pain location: L posterior thigh Pain description: sore Aggravating factors: being on her feet  Relieving factors: sit, gabapentin    PERTINENT HISTORY:  Anxiety, CAD, CHF, COPD, cervical spondylosis s/p single level fusion, DM-II, diabetic neuropathy, depression, GERD, HTN, obesity, osteopenia/osteoporosis, lumbar laminectomy, ORIF R wrist fx 11/2021, vertigo  PRECAUTIONS: Fall  RED FLAGS: None  WEIGHT BEARING RESTRICTIONS: No  FALLS:  Has patient fallen in last 6 months? Yes. Number of falls 1  LIVING ENVIRONMENT: Lives with: lives with their daughter - dtr lives upstairs, pt lives on lower level Lives in: House/apartment - split level Stairs: Yes: Internal: 8 steps; no railings Has following equipment at home: Single point cane, Walker - 4 wheeled, and Grab bars  OCCUPATION: Retired  PLOF: Independent and Leisure: mostly sedentary   PATIENT GOALS: "For the pain to go away."   OBJECTIVE: (objective measures completed at initial evaluation unless otherwise dated)  DIAGNOSTIC FINDINGS:  08/04/23 - MR Brain IMPRESSION: 1. No acute intracranial process. No evidence of acute or subacute infarct. 2. Mildly advanced cerebral volume loss for age, with ex vacuo dilatation of the  ventricles. No disproportionate lobar atrophy.  11/21/21 - CT lumbar spine IMPRESSION: Minor acute superior endplate fracture at L2, more towards the left side, with loss of height of 10%. No retropulsed bone.   Mild degenerative disc disease and degenerative facet disease without compressive stenosis.  COGNITION: Overall cognitive status: Within functional limits for tasks assessed   SENSATION: WFL Diabetic neuropathy - Numbness and tingling in L>R foot  POSTURE:  rounded shoulders, forward head, decreased lumbar lordosis, and flexed trunk   PALPATION: TTP & increased muscle tension over L>R upper glutes; TTP over L greater trochanter  LUMBAR ROM:   Active  Eval  Flexion WFL  Extension WFL  Right lateral flexion WFL  Left lateral flexion WFL p!  Right rotation WFL  Left rotation WFL   (Blank rows = not tested)   LUMBAR SPECIAL TESTS:  Straight leg raise test: Negative and Slump test: Negative  MUSCLE LENGTH: Hamstrings: mild tight R, mod tight L ITB: mild tight B Piriformis: mild tight R, mod tight L Hip flexors: mild tight B Quads: mild tight L Heelcord: NT  LOWER EXTREMITY ROM:    Limited L hip ER  LOWER EXTREMITY MMT:    MMT Right eval Left eval  Hip flexion 4 4-  Hip extension 4 4-  Hip abduction 4 4-  Hip adduction 4+ 4  Hip internal rotation 4+ 4+  Hip external rotation 4- 4-  Knee flexion 4+ 4+  Knee extension 4+ 4+  Ankle dorsiflexion 4+ 4  Ankle plantarflexion    Ankle inversion    Ankle eversion    (Blank rows = not tested)  BED MOBILITY:  Independent  TRANSFERS: Assistive device utilized: None  Sit to stand: Complete Independence Stand to sit: Complete Independence Chair to chair: NT Floor:  NT  GAIT: Distance walked: clinic distances Assistive device utilized: None Level of assistance: Complete Independence Gait pattern: decreased stance time- Left, antalgic, and trunk flexed Comments:   STAIRS:  Level of Assistance:   TBA  Stair Negotiation Technique:   Number of Stairs:    Height of Stairs:   Comments:   FUNCTIONAL TESTS: (Remaining tests TBA next visit) 5 times sit to stand: 21.91 sec Timed up  and go (TUG): 8.57 sec 10 meter walk test: 11.59 sec; 2.83 ft/sec Berg Balance Scale: 48/54 Functional gait assessment: 22/30  PATIENT SURVEYS:  ABC scale 590 / 1600 = 36.9%; <50% indicates low level of physical functioning  Modified Oswestry 13 / 50 = 26.0%; moderate disability    TODAY'S TREATMENT:  11/03/23 Therapeutic Exercise: to improve strength, ROM, flexibility, and endurance  Nustep L5x80min UE/LE Seated HS stretch 2x30" bil NEUROMUSCULAR RE-EDUCATION:  [facility] PT Assessment - 11/03/23 0001       Functional Gait  Assessment   Gait assessed  Yes    Gait Level Surface Walks 20 ft in less than 7 sec but greater than 5.5 sec, uses assistive device, slower speed, mild gait deviations, or deviates 6-10 in outside of the 12 in walkway width.    Change in Gait Speed Able to change speed, demonstrates mild gait deviations, deviates 6-10 in outside of the 12 in walkway width, or no gait deviations, unable to achieve a major change in velocity, or uses a change in velocity, or uses an assistive device.    Gait with Horizontal Head Turns Performs head turns smoothly with slight change in gait velocity (eg, minor disruption to smooth gait path), deviates 6-10 in outside 12 in walkway width, or uses an assistive device.    Gait with Vertical Head Turns Performs task with slight change in gait velocity (eg, minor disruption to smooth gait path), deviates 6 - 10 in outside 12 in walkway width or uses assistive device    Gait and Pivot Turn Pivot turns safely within 3 sec and stops quickly with no loss of balance.    Step Over Obstacle Is able to step over 2 stacked shoe boxes taped together (9 in total height) without changing gait speed. No evidence of imbalance.    Gait with Narrow Base of Support Ambulates 7-9  steps.    Gait with Eyes Closed Walks 20 ft, uses assistive device, slower speed, mild gait deviations, deviates 6-10 in outside 12 in walkway width. Ambulates 20 ft in less than 9 sec but greater than 7 sec.    Ambulating Backwards Walks 20 ft, uses assistive device, slower speed, mild gait deviations, deviates 6-10 in outside 12 in walkway width.    Steps Alternating feet, must use rail.    Total Score 22           TUG test- see above under functional test Seated PPT + hip ABD RTB 2x10 Seated PPT + marching RTB 2x10 Standing rows with RTB 10x3"- cues to avoid shrugging Standing shoulder ext with RTB 10x3"  10/28/23 Therapeutic Exercise: to improve strength, ROM, flexibility, and endurance  Nustep L2x63min UE/LE LTR both ways x 10 bil NEUROMUSCULAR RE-EDUCATION: To improve proprioception, balance, and kinesthesia. Supine PPT- patient started to feel like she needed to vomit Seated PPT 10x3" Seated PPT with hip ADD ball squeeze 10x3" Seated PPT with hip ABD RTB 10x3"  [facility] PT Assessment - 10/28/23 0001       Standardized Balance Assessment   Standardized Balance Assessment Berg Balance Test;Timed Up and Go Test      Berg Balance Test   Sit to Stand Able to stand  independently using hands    Standing Unsupported Able to stand safely 2 minutes    Sitting with Back Unsupported but Feet Supported on Floor or Stool Able to sit safely and securely 2 minutes    Stand to Sit Sits safely with minimal use of hands  Transfers Able to transfer safely, minor use of hands    Standing Unsupported with Eyes Closed Able to stand 10 seconds safely    Standing Unsupported with Feet Together Able to place feet together independently and stand 1 minute safely    From Standing, Reach Forward with Outstretched Arm Can reach forward >12 cm safely (5")    From Standing Position, Pick up Object from Floor Able to pick up shoe safely and easily    From Standing Position, Turn to Look Behind Over each  Shoulder Turn sideways only but maintains balance    Turn 360 Degrees Able to turn 360 degrees safely in 4 seconds or less    Standing Unsupported, Alternately Place Feet on Step/Stool Able to stand independently and complete 8 steps >20 seconds    Standing Unsupported, One Foot in Front Able to take small step independently and hold 30 seconds    Standing on One Leg Able to lift leg independently and hold 5-10 seconds    Total Score 48             10/13/2023  SELF CARE:  Reviewed eval findings and role of PT in addressing identified deficits as well as need for further assessment.  PATIENT EDUCATION:  Education details: initial HEP  Person educated: Patient Education method: Explanation Education comprehension: verbalized understanding  HOME EXERCISE PROGRAM: Access Code: [email or web address] URL: [email or web address] Date: 11/03/2023 Prepared by: Dovie Gell  Exercises - Seated Pelvic Tilt  - 1 x daily - 7 x weekly - 2 sets - 10 reps - 3 sec hold - Seated Hip Adduction Isometrics with Ball  - 1 x daily - 7 x weekly - 2 sets - 10 reps - 3 sec hold - Seated Hip Abduction with Resistance  - 1 x daily - 7 x weekly - 2 sets - 10 reps - 3 sec hold - Seated March with Resistance  - 1 x daily - 7 x weekly - 2 sets - 10 reps   ASSESSMENT:  CLINICAL IMPRESSION: Assessed TUG and FGA today. Pt has actually met her LTG for the FGA. We continued working on seated LE strengthening and core stability exercises, progressing to standing exercises to work on posture as well today. Cues needed to avoid shoulder shrugging with rows. Will definitely need vestibular assessment next visit. Mechelle "Candice Chalet" will benefit from skilled PT to address above deficits to improve mobility and activity tolerance to help reach the maximal level of functional independence and mobility.  OBJECTIVE IMPAIRMENTS: Abnormal gait, decreased activity tolerance, decreased balance, decreased knowledge of condition,  decreased mobility, difficulty walking, decreased ROM, decreased strength, dizziness, impaired perceived functional ability, increased muscle spasms, impaired flexibility, impaired sensation, improper body mechanics, postural dysfunction, and pain.   ACTIVITY LIMITATIONS: carrying, lifting, bending, standing, squatting, sleeping, stairs, transfers, bathing, dressing, and locomotion level  PARTICIPATION LIMITATIONS: meal prep, cleaning, laundry, driving, shopping, and community activity  PERSONAL FACTORS: Age, Fitness, Past/current experiences, Time since onset of injury/illness/exacerbation, and 3+ comorbidities: Anxiety, CAD, CHF, COPD, cervical spondylosis s/p single level fusion, DM-II, diabetic neuropathy, depression, GERD, HTN, obesity, osteopenia/osteoporsis, lumbar laminectomy, ORIF R wrist fx 11/2021  are also affecting patient's functional outcome.   REHAB POTENTIAL: Good  CLINICAL DECISION MAKING: Evolving/moderate complexity  EVALUATION COMPLEXITY: Moderate   GOALS: Goals reviewed with patient? Yes  SHORT TERM GOALS: Target date: 11/10/2023  Patient will be independent with initial HEP to improve outcomes and carryover.  Baseline:  Goal status: IN PROGRESS  2.  Patient will be  educated on strategies to decrease risk of falls.  Baseline:  Goal status: IN PROGRESS  3.  Patient will improve 5xSTS time to </= 15 seconds for improved efficiency and safety with transfers. Baseline: 21.91 sec Goal status: IN PROGRESS  LONG TERM GOALS: Target date: 01/05/2024  Patient will be independent with advanced/ongoing HEP to facilitate ability to maintain/progress functional gains from skilled physical therapy services. Baseline:  Goal status: IN PROGRESS  2.  Patient will be able to ambulate 600' w/o LRAD on variable surfaces with good safety to access community.  Baseline:  Goal status: IN PROGRESS  3.  Patient will be able to step up/down curb safely with LRAD for safety with  community ambulation.  Baseline:  Goal status: IN PROGRESS   4.  Patient will demonstrate improved B LE strength to >/= 4+/5 for improved stability and ease of mobility . Baseline: Refer to above LE MMT table Goal status: IN PROGRESS  5.  Patient will demonstrate decreased TUG time to </= 13.5 sec to decrease risk for falls with transitional mobility. Baseline: TBA Goal status: IN PROGRESS   6.  Patient will improve Berg score by at least 8 points to improve safety and stability with ADLs in standing and reduce risk for falls. (MCID= 8 points)  Baseline: TBA Goal status: IN PROGRESS  7.  Patient will improve FGA score to at least 19/30 to improve gait stability and reduce risk for falls. Baseline: TBA Goal status: MET- 11/03/23  8.  Patient will report >/= 50% on ABC scale (MCID = 19%) to demonstrate improved balance confidence with functional mobility and gait. Baseline: 590 / 1600 = 36.9 % Goal status: IN PROGRESS  9.  Patient will report </= 14% on Modified Oswestry (MCID = 12%) to demonstrate improved functional ability with decreased pain interference. Baseline: 13 / 50 = 26.0 % Goal status: IN PROGRESS   PLAN:  PT FREQUENCY: 2x/week  PT DURATION: 8-12 weeks  PLANNED INTERVENTIONS: 97164- PT Re-evaluation, 97110-Therapeutic exercises, 97530- Therapeutic activity, [id number]- Neuromuscular re-education, 97535- Self Care, 16109- Manual therapy, [phone]- Gait training, [phone]- Canalith repositioning, [id number]- Aquatic Therapy, [phone]- Electrical stimulation (unattended), [phone]- Electrical stimulation (manual), [id number]- Ultrasound, [id number]- Traction (mechanical), [id number]- Ionotophoresis 4mg /ml Dexamethasone , Patient/Family education, Balance training, Stair training, Taping, Dry Needling, Joint mobilization, Spinal mobilization, Vestibular training, Visual/preceptual remediation/compensation, Cryotherapy, Moist heat, and 97750- Physical Performance Test or Measurement  PLAN FOR NEXT SESSION:   iprogress lumbopelvic flexibility and strengthening(does not do well lying down); MT +/- TPDN to address abnormal muscle tension/tightness and pain; Possible vestibular assessment as indicated   Samuella Crocker, PTA 11/03/2023, 8:55 AM

## 2023-11-04 DIAGNOSIS — Z01419 Encounter for gynecological examination (general) (routine) without abnormal findings: Secondary | ICD-10-CM | POA: Diagnosis not present

## 2023-11-04 DIAGNOSIS — Z1331 Encounter for screening for depression: Secondary | ICD-10-CM | POA: Diagnosis not present

## 2023-11-04 DIAGNOSIS — Z124 Encounter for screening for malignant neoplasm of cervix: Secondary | ICD-10-CM | POA: Diagnosis not present

## 2023-11-05 ENCOUNTER — Ambulatory Visit: Admitting: Physical Therapy

## 2023-11-05 DIAGNOSIS — M62830 Muscle spasm of back: Secondary | ICD-10-CM | POA: Diagnosis not present

## 2023-11-05 DIAGNOSIS — M6281 Muscle weakness (generalized): Secondary | ICD-10-CM

## 2023-11-05 DIAGNOSIS — R2681 Unsteadiness on feet: Secondary | ICD-10-CM | POA: Diagnosis not present

## 2023-11-05 DIAGNOSIS — G8929 Other chronic pain: Secondary | ICD-10-CM | POA: Diagnosis not present

## 2023-11-05 DIAGNOSIS — M5442 Lumbago with sciatica, left side: Secondary | ICD-10-CM | POA: Diagnosis not present

## 2023-11-05 NOTE — Therapy (Signed)
 OUTPATIENT PHYSICAL THERAPY NEURO TREATMENT   Patient Name: Destiny Ballard MRN: [id number] DOB:25-May-1951, 73 y.o., female Today's Date: 11/05/2023   END OF SESSION:  PT End of Session - 11/05/23 0844     Visit Number 4    Date for PT Re-Evaluation 01/05/24    Authorization Type Medicare & [employer]    Progress Note Due on Visit 10    PT Start Time 0802    PT Stop Time 0840    PT Time Calculation (min) 38 min    Activity Tolerance Patient tolerated treatment well    Behavior During Therapy [facility] for tasks assessed/performed                Past Medical History:  Diagnosis Date   Anxiety    Prior suicide attempt   CAD (coronary artery disease)    a) s/p DES to LAD 07/2005 b) Last Myoview  low risk 11/2011 showing small fixed apical perfusion defect (prior MI vs attenuation) but no ischemia - normal EF.   Cervical spondylosis    Chest pain 12/10/2011   Chronic diastolic CHF (congestive heart failure) (HCC) 09/18/2017   Chronic eczematous otitis externa of both ears 11/18/2019   Chronic rhinitis 08/07/2016   Complication of anesthesia    developed pneumonia after nerve block was hospitalized   Constipation 12/18/2020   COPD with acute exacerbation (HCC) 04/25/2016   Coronary atherosclerosis 06/28/2008   Depression    Depression with anxiety    Diabetes mellitus (HCC) 09/08/2017   Diabetic neuropathy (HCC)    Dyspnea on exertion 09/02/2012   CXR 07/2012:  No acute process.     Eustachian tube dysfunction, bilateral 11/18/2019   GERD 06/28/2008   Hearing loss    Left   History of colonic polyps 12/18/2020   History of kidney stones    HLD (hyperlipidemia)    Hyperlipidemia associated with type 2 diabetes mellitus (HCC) 08/21/2020   Hypertension    Hypertension associated with diabetes (HCC) 01/30/2016   Hypokalemia 01/27/2016   Insulin  resistance    Iron deficiency anemia    Leukocytosis    history of mild leukocytosis   Lobar pneumonia, unspecified organism  (HCC) 11/13/2017   Melena 12/18/2020   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   Obesity (BMI 30-39.9) 08/24/2018   Last Assessment & Plan:  Exercise and weight reduction is encouraged.   OSA (obstructive sleep apnea) 05/06/2014   Pneumonia due to COVID-19 virus 08/21/2020   Precordial chest pain    Sciatic nerve disease, left    leg   Sepsis (HCC) 09/18/2017   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   Tinnitus of both ears 11/18/2019   Type 2 diabetes mellitus with hyperglycemia, with long-term current use of insulin  (HCC) 12/10/2019   Uncontrolled type 2 diabetes mellitus with complication, with long-term current use of insulin  03/14/2020   Past Surgical History:  Procedure Laterality Date   BREAST ENHANCEMENT SURGERY     CARDIAC CATHETERIZATION  06/17/2007   NORMAL. EF 60%   CARDIAC CATHETERIZATION N/A 01/29/2016   Procedure: Left Heart Cath and Coronary Angiography;  Surgeon: Arnoldo Lapping, MD;  Location: [facility] INVASIVE CV LAB;  Service: Cardiovascular;  Laterality: N/A;   CERVICAL SPONDYLOSIS     SINGLE LEVEL FUSION   CHILDBIRTH     X3   COLONOSCOPY  2010   normal   COLONOSCOPY WITH PROPOFOL   10/01/2021   CORONARY STENT PLACEMENT  07/15/2005   LEFT ANTERIOR DESCENDING   FOREARM FRACTURE  SURGERY  07/15/2008   hand and shoulder    INCISION AND DRAINAGE BREAST ABSCESS  01/05/2012       INCISION AND DRAINAGE PERIRECTAL ABSCESS N/A 02/18/2014   Procedure: IRRIGATION AND DEBRIDEMENT PERIRECTAL ABSCESS;  Surgeon: Azucena Bollard, MD;  Location: [facility] ORS;  Service: General;  Laterality: N/A;   LEFT HEART CATH AND CORONARY ANGIOGRAPHY N/A 10/10/2017   Procedure: LEFT HEART CATH AND CORONARY ANGIOGRAPHY;  Surgeon: Odie Benne, MD;  Location: [facility] INVASIVE CV LAB;  Service: Cardiovascular;  Laterality: N/A;   LUMBAR LAMINECTOMY     ORIF WRIST FRACTURE Right 11/27/2021   Procedure: OPEN REDUCTION INTERNAL FIXATION (ORIF) WRIST FRACTURE;   Surgeon: Osa Blase, MD;  Location: [facility] ORS;  Service: Orthopedics;  Laterality: Right;   ROTATOR CUFF REPAIR     bilaterla   TONSILLECTOMY AND ADENOIDECTOMY     TUBAL LIGATION     VIDEO BRONCHOSCOPY Bilateral 08/13/2016   Procedure: VIDEO BRONCHOSCOPY WITHOUT FLUORO;  Surgeon: Denson Flake, MD;  Location: [facility] ENDOSCOPY;  Service: Cardiopulmonary;  Laterality: Bilateral;   Patient Active Problem List   Diagnosis Date Noted   Diabetes mellitus (HCC) 03/01/2022   Hypoxia 11/27/2021   Osteopenia 06/27/2021   Melena 12/18/2020   History of colonic polyps 12/18/2020   Constipation 12/18/2020   Obesity    Iron deficiency anemia    Insulin  resistance    Hyperlipidemia    Cervical spondylosis    Anxiety    Pneumonia due to COVID-19 virus 08/21/2020   Hyperlipidemia associated with type 2 diabetes mellitus (HCC) 08/21/2020   Type 2 diabetes mellitus with diabetic polyneuropathy, with long-term current use of insulin  (HCC) 12/10/2019   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   Tinnitus of both ears 11/18/2019   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   Eustachian tube dysfunction, bilateral 11/18/2019   Chronic eczematous otitis externa of both ears 11/18/2019   Obesity (BMI 30-39.9) 08/24/2018   Depression with anxiety 08/24/2018   CAD (coronary artery disease) 08/24/2018   Abnormal cardiovascular stress test 08/24/2018   Precordial chest pain    Sepsis (HCC) 09/18/2017   Chronic diastolic CHF (congestive heart failure) (HCC) 09/18/2017   Chronic rhinitis 08/07/2016   COPD with acute exacerbation (HCC) 04/25/2016   Hypertension associated with diabetes (HCC) 01/30/2016   Diarrhea 09/26/2015   OSA (obstructive sleep apnea) 05/06/2014   Dyspnea on exertion 09/02/2012   GERD 06/28/2008    PCP: Kaylee Partridge, MD   REFERRING PROVIDER: Omega Bible, MD   REFERRING DIAG: R26.9 (ICD-10-CM) - Gait difficulty   PT evaluation; gait  diff; back pain   THERAPY DIAG:  Unsteadiness on feet  Chronic bilateral low back pain with left-sided sciatica  Muscle weakness (generalized)  Muscle spasm of back  RATIONALE FOR EVALUATION AND TREATMENT: Rehabilitation  ONSET DATE: 7-8 yrs for sciatic pain & balance issues  NEXT MD VISIT: none scheduled   SUBJECTIVE:  SUBJECTIVE STATEMENT: Pt agreeable for vestibular assessment  Pt accompanied by: self  PAIN: Are you having pain? Yes: NPRS scale: 5/10  Pain location: L posterior thigh Pain description: sore Aggravating factors: being on her feet  Relieving factors: sit, gabapentin    PERTINENT HISTORY:  Anxiety, CAD, CHF, COPD, cervical spondylosis s/p single level fusion, DM-II, diabetic neuropathy, depression, GERD, HTN, obesity, osteopenia/osteoporosis, lumbar laminectomy, ORIF R wrist fx 11/2021, vertigo  PRECAUTIONS: Fall  RED FLAGS: None  WEIGHT BEARING RESTRICTIONS: No  FALLS:  Has patient fallen in last 6 months? Yes. Number of falls 1  LIVING ENVIRONMENT: Lives with: lives with their daughter - dtr lives upstairs, pt lives on lower level Lives in: House/apartment - split level Stairs: Yes: Internal: 8 steps; no railings Has following equipment at home: Single point cane, Walker - 4 wheeled, and Grab bars  OCCUPATION: Retired  PLOF: Independent and Leisure: mostly sedentary   PATIENT GOALS: "For the pain to go away."   OBJECTIVE: (objective measures completed at initial evaluation unless otherwise dated)  DIAGNOSTIC FINDINGS:  08/04/23 - MR Brain IMPRESSION: 1. No acute intracranial process. No evidence of acute or subacute infarct. 2. Mildly advanced cerebral volume loss for age, with ex vacuo dilatation of the ventricles. No disproportionate lobar  atrophy.  11/21/21 - CT lumbar spine IMPRESSION: Minor acute superior endplate fracture at L2, more towards the left side, with loss of height of 10%. No retropulsed bone.   Mild degenerative disc disease and degenerative facet disease without compressive stenosis.  COGNITION: Overall cognitive status: Within functional limits for tasks assessed   SENSATION: WFL Diabetic neuropathy - Numbness and tingling in L>R foot  POSTURE:  rounded shoulders, forward head, decreased lumbar lordosis, and flexed trunk   PALPATION: TTP & increased muscle tension over L>R upper glutes; TTP over L greater trochanter  LUMBAR ROM:   Active  Eval  Flexion WFL  Extension WFL  Right lateral flexion WFL  Left lateral flexion WFL p!  Right rotation WFL  Left rotation WFL   (Blank rows = not tested)   LUMBAR SPECIAL TESTS:  Straight leg raise test: Negative and Slump test: Negative  MUSCLE LENGTH: Hamstrings: mild tight R, mod tight L ITB: mild tight B Piriformis: mild tight R, mod tight L Hip flexors: mild tight B Quads: mild tight L Heelcord: NT  LOWER EXTREMITY ROM:    Limited L hip ER  LOWER EXTREMITY MMT:    MMT Right eval Left eval  Hip flexion 4 4-  Hip extension 4 4-  Hip abduction 4 4-  Hip adduction 4+ 4  Hip internal rotation 4+ 4+  Hip external rotation 4- 4-  Knee flexion 4+ 4+  Knee extension 4+ 4+  Ankle dorsiflexion 4+ 4  Ankle plantarflexion    Ankle inversion    Ankle eversion    (Blank rows = not tested)  BED MOBILITY:  Independent  TRANSFERS: Assistive device utilized: None  Sit to stand: Complete Independence Stand to sit: Complete Independence Chair to chair: NT Floor:  NT  GAIT: Distance walked: clinic distances Assistive device utilized: None Level of assistance: Complete Independence Gait pattern: decreased stance time- Left, antalgic, and trunk flexed Comments:   STAIRS:  Level of Assistance:  TBA  Stair Negotiation Technique:   Number  of Stairs:    Height of Stairs:   Comments:   FUNCTIONAL TESTS: (Remaining tests TBA next visit) 5 times sit to stand: 21.91 sec Timed up and go (TUG): 8.57 sec 10 meter  walk test: 11.59 sec; 2.83 ft/sec Berg Balance Scale: 48/54 Functional gait assessment: 22/30  PATIENT SURVEYS:  ABC scale 590 / 1600 = 36.9%; <50% indicates low level of physical functioning  Modified Oswestry 13 / 50 = 26.0%; moderate disability   VESTIBULAR ASSESSMENT 11/05/23:  GENERAL OBSERVATION: n/a   SYMPTOM BEHAVIOR:  Subjective history: Pt reports ringing in her R ear for years. Losing hearing in her left ear. Laid down flat in the doctor's office and she got some spinning sensation. This has been ongoing for 6 years. Pt sleeps in a recliner at home.   Non-Vestibular symptoms: changes in hearing, headaches, tinnitus, and nausea/vomiting (occasionally)  Type of dizziness:  "things are going around"  Frequency: Whenever lay down flat.   Duration: Gets up once it starts spinning so not sure how long it lasts  Aggravating factors: Induced by position change: lying supine and supine to sit  Relieving factors:  getting up from flat position  Progression of symptoms: unchanged  OCULOMOTOR EXAM:  Ocular Alignment: normal  Ocular ROM: No Limitations  Spontaneous Nystagmus: absent  Gaze-Induced Nystagmus: absent  Smooth Pursuits: intact  Saccades: slow   FRENZEL - FIXATION SUPRESSED:  Unable to assess  VESTIBULAR - OCULAR REFLEX:   Slow VOR: Normal  VOR Cancellation: Normal  Fast VOR: Slightly symptomatic with head turns vs head nods  Head-Impulse Test: HIT Right: negative HIT Left: negative   POSITIONAL TESTING:  Dix hallpike L (+) with <10 sec of upbeating L torsional nystagmus Dix hallpike R (+) with <20 sec of upbeating R torsional nystagmus (greater intensity of beating compared to L)   OTHOSTATICS: not done    TODAY'S TREATMENT:  11/05/23 Canalith repositioning  Epley maneuver x1 on  L  Epley maneuver x1 on R Education on vestibular system, BPPV, treatment, POC   11/03/23 Therapeutic Exercise: to improve strength, ROM, flexibility, and endurance  Nustep L5x23min UE/LE Seated HS stretch 2x30" bil NEUROMUSCULAR RE-EDUCATION:  [facility] PT Assessment - 11/03/23 0001       Functional Gait  Assessment   Gait assessed  Yes    Gait Level Surface Walks 20 ft in less than 7 sec but greater than 5.5 sec, uses assistive device, slower speed, mild gait deviations, or deviates 6-10 in outside of the 12 in walkway width.    Change in Gait Speed Able to change speed, demonstrates mild gait deviations, deviates 6-10 in outside of the 12 in walkway width, or no gait deviations, unable to achieve a major change in velocity, or uses a change in velocity, or uses an assistive device.    Gait with Horizontal Head Turns Performs head turns smoothly with slight change in gait velocity (eg, minor disruption to smooth gait path), deviates 6-10 in outside 12 in walkway width, or uses an assistive device.    Gait with Vertical Head Turns Performs task with slight change in gait velocity (eg, minor disruption to smooth gait path), deviates 6 - 10 in outside 12 in walkway width or uses assistive device    Gait and Pivot Turn Pivot turns safely within 3 sec and stops quickly with no loss of balance.    Step Over Obstacle Is able to step over 2 stacked shoe boxes taped together (9 in total height) without changing gait speed. No evidence of imbalance.    Gait with Narrow Base of Support Ambulates 7-9 steps.    Gait with Eyes Closed Walks 20 ft, uses assistive device, slower speed, mild gait deviations, deviates 6-10  in outside 12 in walkway width. Ambulates 20 ft in less than 9 sec but greater than 7 sec.    Ambulating Backwards Walks 20 ft, uses assistive device, slower speed, mild gait deviations, deviates 6-10 in outside 12 in walkway width.    Steps Alternating feet, must use rail.    Total Score 22            TUG test- see above under functional test Seated PPT + hip ABD RTB 2x10 Seated PPT + marching RTB 2x10 Standing rows with RTB 10x3"- cues to avoid shrugging Standing shoulder ext with RTB 10x3"  10/28/23 Therapeutic Exercise: to improve strength, ROM, flexibility, and endurance  Nustep L2x10min UE/LE LTR both ways x 10 bil NEUROMUSCULAR RE-EDUCATION: To improve proprioception, balance, and kinesthesia. Supine PPT- patient started to feel like she needed to vomit Seated PPT 10x3" Seated PPT with hip ADD ball squeeze 10x3" Seated PPT with hip ABD RTB 10x3"  [facility] PT Assessment - 10/28/23 0001       Standardized Balance Assessment   Standardized Balance Assessment Berg Balance Test;Timed Up and Go Test      Berg Balance Test   Sit to Stand Able to stand  independently using hands    Standing Unsupported Able to stand safely 2 minutes    Sitting with Back Unsupported but Feet Supported on Floor or Stool Able to sit safely and securely 2 minutes    Stand to Sit Sits safely with minimal use of hands    Transfers Able to transfer safely, minor use of hands    Standing Unsupported with Eyes Closed Able to stand 10 seconds safely    Standing Unsupported with Feet Together Able to place feet together independently and stand 1 minute safely    From Standing, Reach Forward with Outstretched Arm Can reach forward >12 cm safely (5")    From Standing Position, Pick up Object from Floor Able to pick up shoe safely and easily    From Standing Position, Turn to Look Behind Over each Shoulder Turn sideways only but maintains balance    Turn 360 Degrees Able to turn 360 degrees safely in 4 seconds or less    Standing Unsupported, Alternately Place Feet on Step/Stool Able to stand independently and complete 8 steps >20 seconds    Standing Unsupported, One Foot in Front Able to take small step independently and hold 30 seconds    Standing on One Leg Able to lift leg independently and hold  5-10 seconds    Total Score 48             10/13/2023  SELF CARE:  Reviewed eval findings and role of PT in addressing identified deficits as well as need for further assessment.  PATIENT EDUCATION:  Education details: initial HEP  Person educated: Patient Education method: Explanation Education comprehension: verbalized understanding  HOME EXERCISE PROGRAM: Access Code: [email or web address] URL: [email or web address] Date: 11/03/2023 Prepared by: Braylin Clark  Exercises - Seated Pelvic Tilt  - 1 x daily - 7 x weekly - 2 sets - 10 reps - 3 sec hold - Seated Hip Adduction Isometrics with Ball  - 1 x daily - 7 x weekly - 2 sets - 10 reps - 3 sec hold - Seated Hip Abduction with Resistance  - 1 x daily - 7 x weekly - 2 sets - 10 reps - 3 sec hold - Seated March with Resistance  - 1 x daily - 7 x weekly - 2 sets - 10 reps  ASSESSMENT:  CLINICAL IMPRESSION: Ms. Candice Chalet comes in for vestibular assessment as this may be impacting her balance and stability. Pt found to have (+) Dix-hallpike bilaterally (R ear worse than L) for posterior canalithiasis. Performed Epley maneuver for both ears today. Will benefit from continued PT to treat her BPPV and recheck her other canaliths for improved balance.   OBJECTIVE IMPAIRMENTS: Abnormal gait, decreased activity tolerance, decreased balance, decreased knowledge of condition, decreased mobility, difficulty walking, decreased ROM, decreased strength, dizziness, impaired perceived functional ability, increased muscle spasms, impaired flexibility, impaired sensation, improper body mechanics, postural dysfunction, and pain.   ACTIVITY LIMITATIONS: carrying, lifting, bending, standing, squatting, sleeping, stairs, transfers, bathing, dressing, and locomotion level  PARTICIPATION LIMITATIONS: meal prep, cleaning, laundry, driving, shopping, and community activity  PERSONAL FACTORS: Age, Fitness, Past/current experiences, Time since onset of  injury/illness/exacerbation, and 3+ comorbidities: Anxiety, CAD, CHF, COPD, cervical spondylosis s/p single level fusion, DM-II, diabetic neuropathy, depression, GERD, HTN, obesity, osteopenia/osteoporsis, lumbar laminectomy, ORIF R wrist fx 11/2021  are also affecting patient's functional outcome.   REHAB POTENTIAL: Good  CLINICAL DECISION MAKING: Evolving/moderate complexity  EVALUATION COMPLEXITY: Moderate   GOALS: Goals reviewed with patient? Yes  SHORT TERM GOALS: Target date: 11/10/2023  Patient will be independent with initial HEP to improve outcomes and carryover.  Baseline:  Goal status: IN PROGRESS  2.  Patient will be educated on strategies to decrease risk of falls.  Baseline:  Goal status: IN PROGRESS  3.  Patient will improve 5xSTS time to </= 15 seconds for improved efficiency and safety with transfers. Baseline: 21.91 sec Goal status: IN PROGRESS  LONG TERM GOALS: Target date: 01/05/2024  Patient will be independent with advanced/ongoing HEP to facilitate ability to maintain/progress functional gains from skilled physical therapy services. Baseline:  Goal status: IN PROGRESS  2.  Patient will be able to ambulate 600' w/o LRAD on variable surfaces with good safety to access community.  Baseline:  Goal status: IN PROGRESS  3.  Patient will be able to step up/down curb safely with LRAD for safety with community ambulation.  Baseline:  Goal status: IN PROGRESS   4.  Patient will demonstrate improved B LE strength to >/= 4+/5 for improved stability and ease of mobility . Baseline: Refer to above LE MMT table Goal status: IN PROGRESS  5.  Patient will demonstrate decreased TUG time to </= 13.5 sec to decrease risk for falls with transitional mobility. Baseline: TBA Goal status: IN PROGRESS   6.  Patient will improve Berg score by at least 8 points to improve safety and stability with ADLs in standing and reduce risk for falls. (MCID= 8 points)  Baseline:  48 Goal status: IN PROGRESS  7.  Patient will improve FGA score to at least 19/30 to improve gait stability and reduce risk for falls.  REVISED GOAL 11/05/23: Patient will improve FGA to >/=26/30 to demo MCID Baseline: INITIAL GOAL MET- 11/03/23 with 22/30 Goal status: NEW  8.  Patient will report >/= 50% on ABC scale (MCID = 19%) to demonstrate improved balance confidence with functional mobility and gait. Baseline: 590 / 1600 = 36.9 % Goal status: IN PROGRESS  9.  Patient will report </= 14% on Modified Oswestry (MCID = 12%) to demonstrate improved functional ability with decreased pain interference. Baseline: 13 / 50 = 26.0 % Goal status: IN PROGRESS   10. Pt will have no s/s of nystagmus or dizziness with Canalith testing to demo resolution of her BPPV Baseline: (+) R and L  ear for posterior canalithiasis Goal status: NEW  PLAN:  PT FREQUENCY: 2x/week  PT DURATION: 8-12 weeks  PLANNED INTERVENTIONS: [id number]- PT Re-evaluation, 97110-Therapeutic exercises, 97530- Therapeutic activity, 97112- Neuromuscular re-education, 97535- Self Care, 13244- Manual therapy, [phone]- Gait training, [phone]- Canalith repositioning, [id number]- Aquatic Therapy, [phone]- Electrical stimulation (unattended), [phone]- Electrical stimulation (manual), [id number]- Ultrasound, [id number]- Traction (mechanical), [id number]- Ionotophoresis 4mg /ml Dexamethasone , Patient/Family education, Balance training, Stair training, Taping, Dry Needling, Joint mobilization, Spinal mobilization, Vestibular training, Visual/preceptual remediation/compensation, Cryotherapy, Moist heat, and 97750- Physical Performance Test or Measurement  PLAN FOR NEXT SESSION:  iprogress lumbopelvic flexibility and strengthening(does not do well lying down); MT +/- TPDN to address abnormal muscle tension/tightness and pain; Possible vestibular assessment as indicated   [place] Lasseter April Ma L Sareena Odeh, PT 11/05/2023, 8:45 AM

## 2023-11-10 ENCOUNTER — Ambulatory Visit

## 2023-11-10 DIAGNOSIS — R2681 Unsteadiness on feet: Secondary | ICD-10-CM | POA: Diagnosis not present

## 2023-11-10 DIAGNOSIS — G8929 Other chronic pain: Secondary | ICD-10-CM

## 2023-11-10 DIAGNOSIS — M6281 Muscle weakness (generalized): Secondary | ICD-10-CM

## 2023-11-10 DIAGNOSIS — M5442 Lumbago with sciatica, left side: Secondary | ICD-10-CM | POA: Diagnosis not present

## 2023-11-10 DIAGNOSIS — M62830 Muscle spasm of back: Secondary | ICD-10-CM

## 2023-11-10 NOTE — Therapy (Addendum)
 OUTPATIENT PHYSICAL THERAPY NEURO TREATMENT / DISCHARGE SUMMARY   Patient Name: Destiny Ballard MRN: [id number] DOB:1950/10/29, 73 y.o., female Today's Date: 11/10/2023   END OF SESSION:  PT End of Session - 11/10/23 0817     Visit Number 5    Date for PT Re-Evaluation 01/05/24    Authorization Type Medicare & [employer]    Progress Note Due on Visit 10    PT Start Time [phone]   pt late   PT Stop Time 0856    PT Time Calculation (min) 45 min    Activity Tolerance Patient tolerated treatment well    Behavior During Therapy [facility] for tasks assessed/performed                 Past Medical History:  Diagnosis Date   Anxiety    Prior suicide attempt   CAD (coronary artery disease)    a) s/p DES to LAD 07/2005 b) Last Myoview  low risk 11/2011 showing small fixed apical perfusion defect (prior MI vs attenuation) but no ischemia - normal EF.   Cervical spondylosis    Chest pain 12/10/2011   Chronic diastolic CHF (congestive heart failure) (HCC) 09/18/2017   Chronic eczematous otitis externa of both ears 11/18/2019   Chronic rhinitis 08/07/2016   Complication of anesthesia    developed pneumonia after nerve block was hospitalized   Constipation 12/18/2020   COPD with acute exacerbation (HCC) 04/25/2016   Coronary atherosclerosis 06/28/2008   Depression    Depression with anxiety    Diabetes mellitus (HCC) 09/08/2017   Diabetic neuropathy (HCC)    Dyspnea on exertion 09/02/2012   CXR 07/2012:  No acute process.     Eustachian tube dysfunction, bilateral 11/18/2019   GERD 06/28/2008   Hearing loss    Left   History of colonic polyps 12/18/2020   History of kidney stones    HLD (hyperlipidemia)    Hyperlipidemia associated with type 2 diabetes mellitus (HCC) 08/21/2020   Hypertension    Hypertension associated with diabetes (HCC) 01/30/2016   Hypokalemia 01/27/2016   Insulin  resistance    Iron deficiency anemia    Leukocytosis    history of mild leukocytosis   Lobar  pneumonia, unspecified organism (HCC) 11/13/2017   Melena 12/18/2020   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   Obesity (BMI 30-39.9) 08/24/2018   Last Assessment & Plan:  Exercise and weight reduction is encouraged.   OSA (obstructive sleep apnea) 05/06/2014   Pneumonia due to COVID-19 virus 08/21/2020   Precordial chest pain    Sciatic nerve disease, left    leg   Sepsis (HCC) 09/18/2017   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   Tinnitus of both ears 11/18/2019   Type 2 diabetes mellitus with hyperglycemia, with long-term current use of insulin  (HCC) 12/10/2019   Uncontrolled type 2 diabetes mellitus with complication, with long-term current use of insulin  03/14/2020   Past Surgical History:  Procedure Laterality Date   BREAST ENHANCEMENT SURGERY     CARDIAC CATHETERIZATION  06/17/2007   NORMAL. EF 60%   CARDIAC CATHETERIZATION N/A 01/29/2016   Procedure: Left Heart Cath and Coronary Angiography;  Surgeon: Ozell Fell, MD;  Location: [facility] INVASIVE CV LAB;  Service: Cardiovascular;  Laterality: N/A;   CERVICAL SPONDYLOSIS     SINGLE LEVEL FUSION   CHILDBIRTH     X3   COLONOSCOPY  2010   normal   COLONOSCOPY WITH PROPOFOL   10/01/2021   CORONARY STENT PLACEMENT  07/15/2005  LEFT ANTERIOR DESCENDING   FOREARM FRACTURE SURGERY  07/15/2008   hand and shoulder    INCISION AND DRAINAGE BREAST ABSCESS  01/05/2012       INCISION AND DRAINAGE PERIRECTAL ABSCESS N/A 02/18/2014   Procedure: IRRIGATION AND DEBRIDEMENT PERIRECTAL ABSCESS;  Surgeon: Donnice KATHEE Lunger, MD;  Location: [facility] ORS;  Service: General;  Laterality: N/A;   LEFT HEART CATH AND CORONARY ANGIOGRAPHY N/A 10/10/2017   Procedure: LEFT HEART CATH AND CORONARY ANGIOGRAPHY;  Surgeon: Verlin Lonni BIRCH, MD;  Location: [facility] INVASIVE CV LAB;  Service: Cardiovascular;  Laterality: N/A;   LUMBAR LAMINECTOMY     ORIF WRIST FRACTURE Right 11/27/2021   Procedure: OPEN REDUCTION INTERNAL  FIXATION (ORIF) WRIST FRACTURE;  Surgeon: Josefina Chew, MD;  Location: [facility] ORS;  Service: Orthopedics;  Laterality: Right;   ROTATOR CUFF REPAIR     bilaterla   TONSILLECTOMY AND ADENOIDECTOMY     TUBAL LIGATION     VIDEO BRONCHOSCOPY Bilateral 08/13/2016   Procedure: VIDEO BRONCHOSCOPY WITHOUT FLUORO;  Surgeon: Lamar GORMAN Chris, MD;  Location: [facility] ENDOSCOPY;  Service: Cardiopulmonary;  Laterality: Bilateral;   Patient Active Problem List   Diagnosis Date Noted   Diabetes mellitus (HCC) 03/01/2022   Hypoxia 11/27/2021   Osteopenia 06/27/2021   Melena 12/18/2020   History of colonic polyps 12/18/2020   Constipation 12/18/2020   Obesity    Iron deficiency anemia    Insulin  resistance    Hyperlipidemia    Cervical spondylosis    Anxiety    Pneumonia due to COVID-19 virus 08/21/2020   Hyperlipidemia associated with type 2 diabetes mellitus (HCC) 08/21/2020   Type 2 diabetes mellitus with diabetic polyneuropathy, with long-term current use of insulin  (HCC) 12/10/2019   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   Tinnitus of both ears 11/18/2019   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   Eustachian tube dysfunction, bilateral 11/18/2019   Chronic eczematous otitis externa of both ears 11/18/2019   Obesity (BMI 30-39.9) 08/24/2018   Depression with anxiety 08/24/2018   CAD (coronary artery disease) 08/24/2018   Abnormal cardiovascular stress test 08/24/2018   Precordial chest pain    Sepsis (HCC) 09/18/2017   Chronic diastolic CHF (congestive heart failure) (HCC) 09/18/2017   Chronic rhinitis 08/07/2016   COPD with acute exacerbation (HCC) 04/25/2016   Hypertension associated with diabetes (HCC) 01/30/2016   Diarrhea 09/26/2015   OSA (obstructive sleep apnea) 05/06/2014   Dyspnea on exertion 09/02/2012   GERD 06/28/2008    PCP: Watt Harlene BROCKS, MD   REFERRING PROVIDER: Margaret Eduard SAUNDERS, MD   REFERRING DIAG: R26.9 (ICD-10-CM) - Gait  difficulty   PT evaluation; gait diff; back pain   THERAPY DIAG:  Unsteadiness on feet  Chronic bilateral low back pain with left-sided sciatica  Muscle weakness (generalized)  Muscle spasm of back  RATIONALE FOR EVALUATION AND TREATMENT: Rehabilitation  ONSET DATE: 7-8 yrs for sciatic pain & balance issues  NEXT MD VISIT: none scheduled   SUBJECTIVE:  SUBJECTIVE STATEMENT: Pt reports not feeling too bad after vestibular treatment, she does not some soreness on R ear.   Pt accompanied by: self  PAIN: Are you having pain? Yes: NPRS scale: 0/10  Pain location: L posterior thigh Pain description: sore Aggravating factors: being on her feet  Relieving factors: sit, gabapentin    PERTINENT HISTORY:  Anxiety, CAD, CHF, COPD, cervical spondylosis s/p single level fusion, DM-II, diabetic neuropathy, depression, GERD, HTN, obesity, osteopenia/osteoporosis, lumbar laminectomy, ORIF R wrist fx 11/2021, vertigo  PRECAUTIONS: Fall  RED FLAGS: None  WEIGHT BEARING RESTRICTIONS: No  FALLS:  Has patient fallen in last 6 months? Yes. Number of falls 1  LIVING ENVIRONMENT: Lives with: lives with their daughter - dtr lives upstairs, pt lives on lower level Lives in: House/apartment - split level Stairs: Yes: Internal: 8 steps; no railings Has following equipment at home: Single point cane, Environmental consultant - 4 wheeled, and Grab bars  OCCUPATION: Retired  PLOF: Independent and Leisure: mostly sedentary   PATIENT GOALS: For the pain to go away.   OBJECTIVE: (objective measures completed at initial evaluation unless otherwise dated)  DIAGNOSTIC FINDINGS:  08/04/23 - MR Brain IMPRESSION: 1. No acute intracranial process. No evidence of acute or subacute infarct. 2. Mildly advanced cerebral  volume loss for age, with ex vacuo dilatation of the ventricles. No disproportionate lobar atrophy.  11/21/21 - CT lumbar spine IMPRESSION: Minor acute superior endplate fracture at L2, more towards the left side, with loss of height of 10%. No retropulsed bone.   Mild degenerative disc disease and degenerative facet disease without compressive stenosis.  COGNITION: Overall cognitive status: Within functional limits for tasks assessed   SENSATION: WFL Diabetic neuropathy - Numbness and tingling in L>R foot  POSTURE:  rounded shoulders, forward head, decreased lumbar lordosis, and flexed trunk   PALPATION: TTP & increased muscle tension over L>R upper glutes; TTP over L greater trochanter  LUMBAR ROM:   Active  Eval  Flexion WFL  Extension WFL  Right lateral flexion WFL  Left lateral flexion WFL p!  Right rotation WFL  Left rotation WFL   (Blank rows = not tested)   LUMBAR SPECIAL TESTS:  Straight leg raise test: Negative and Slump test: Negative  MUSCLE LENGTH: Hamstrings: mild tight R, mod tight L ITB: mild tight B Piriformis: mild tight R, mod tight L Hip flexors: mild tight B Quads: mild tight L Heelcord: NT  LOWER EXTREMITY ROM:    Limited L hip ER  LOWER EXTREMITY MMT:    MMT Right eval Left eval  Hip flexion 4 4-  Hip extension 4 4-  Hip abduction 4 4-  Hip adduction 4+ 4  Hip internal rotation 4+ 4+  Hip external rotation 4- 4-  Knee flexion 4+ 4+  Knee extension 4+ 4+  Ankle dorsiflexion 4+ 4  Ankle plantarflexion    Ankle inversion    Ankle eversion    (Blank rows = not tested)  BED MOBILITY:  Independent  TRANSFERS: Assistive device utilized: None  Sit to stand: Complete Independence Stand to sit: Complete Independence Chair to chair: NT Floor: NT  GAIT: Distance walked: clinic distances Assistive device utilized: None Level of assistance: Complete Independence Gait pattern: decreased stance time- Left, antalgic, and trunk  flexed Comments:   STAIRS:  Level of Assistance: TBA  Stair Negotiation Technique:   Number of Stairs:    Height of Stairs:   Comments:   FUNCTIONAL TESTS: (Remaining tests TBA next visit) 5 times sit to stand:  21.91 sec Timed up and go (TUG): 8.57 sec 10 meter walk test: 11.59 sec; 2.83 ft/sec Berg Balance Scale: 48/54 Functional gait assessment: 22/30  PATIENT SURVEYS:  ABC scale 590 / 1600 = 36.9%; <50% indicates low level of physical functioning  Modified Oswestry 13 / 50 = 26.0%; moderate disability   VESTIBULAR ASSESSMENT 11/05/23:  GENERAL OBSERVATION: n/a   SYMPTOM BEHAVIOR:  Subjective history: Pt reports ringing in her R ear for years. Losing hearing in her left ear. Laid down flat in the doctor's office and she got some spinning sensation. This has been ongoing for 6 years. Pt sleeps in a recliner at home.   Non-Vestibular symptoms: changes in hearing, headaches, tinnitus, and nausea/vomiting (occasionally)  Type of dizziness: things are going around  Frequency: Whenever lay down flat.   Duration: Gets up once it starts spinning so not sure how long it lasts  Aggravating factors: Induced by position change: lying supine and supine to sit  Relieving factors: getting up from flat position  Progression of symptoms: unchanged  OCULOMOTOR EXAM:  Ocular Alignment: normal  Ocular ROM: No Limitations  Spontaneous Nystagmus: absent  Gaze-Induced Nystagmus: absent  Smooth Pursuits: intact  Saccades: slow   FRENZEL - FIXATION SUPRESSED:  Unable to assess  VESTIBULAR - OCULAR REFLEX:   Slow VOR: Normal  VOR Cancellation: Normal  Fast VOR: Slightly symptomatic with head turns vs head nods  Head-Impulse Test: HIT Right: negative HIT Left: negative   POSITIONAL TESTING:  Dix hallpike L (+) with <10 sec of upbeating L torsional nystagmus Dix hallpike R (+) with <20 sec of upbeating R torsional nystagmus (greater intensity of beating compared to L)   OTHOSTATICS:  not done    TODAY'S TREATMENT:  11/10/23 Nustep L5x20min UE/LE 5xSTS- 17.73 sec Education on reducing risk for falls Standing marching, hip abduction, hip extension with B HA 2x10 Seated hip ABD GTB with TrA 10x3 Seated marching GTB with TrA 10x3 Mini squats with GTB at knees x 10 Lateral toe taps with GTB at knees x 10 bil  11/05/23 Canalith repositioning  Epley maneuver x1 on L  Epley maneuver x1 on R Education on vestibular system, BPPV, treatment, POC   11/03/23 Therapeutic Exercise: to improve strength, ROM, flexibility, and endurance  Nustep L5x85min UE/LE Seated HS stretch 2x30 bil NEUROMUSCULAR RE-EDUCATION:  [facility] PT Assessment - 11/03/23 0001       Functional Gait  Assessment   Gait assessed  Yes    Gait Level Surface Walks 20 ft in less than 7 sec but greater than 5.5 sec, uses assistive device, slower speed, mild gait deviations, or deviates 6-10 in outside of the 12 in walkway width.    Change in Gait Speed Able to change speed, demonstrates mild gait deviations, deviates 6-10 in outside of the 12 in walkway width, or no gait deviations, unable to achieve a major change in velocity, or uses a change in velocity, or uses an assistive device.    Gait with Horizontal Head Turns Performs head turns smoothly with slight change in gait velocity (eg, minor disruption to smooth gait path), deviates 6-10 in outside 12 in walkway width, or uses an assistive device.    Gait with Vertical Head Turns Performs task with slight change in gait velocity (eg, minor disruption to smooth gait path), deviates 6 - 10 in outside 12 in walkway width or uses assistive device    Gait and Pivot Turn Pivot turns safely within 3 sec and stops quickly with no  loss of balance.    Step Over Obstacle Is able to step over 2 stacked shoe boxes taped together (9 in total height) without changing gait speed. No evidence of imbalance.    Gait with Narrow Base of Support Ambulates 7-9 steps.    Gait with  Eyes Closed Walks 20 ft, uses assistive device, slower speed, mild gait deviations, deviates 6-10 in outside 12 in walkway width. Ambulates 20 ft in less than 9 sec but greater than 7 sec.    Ambulating Backwards Walks 20 ft, uses assistive device, slower speed, mild gait deviations, deviates 6-10 in outside 12 in walkway width.    Steps Alternating feet, must use rail.    Total Score 22           TUG test- see above under functional test Seated PPT + hip ABD RTB 2x10 Seated PPT + marching RTB 2x10 Standing rows with RTB 10x3- cues to avoid shrugging Standing shoulder ext with RTB 10x3  10/28/23 Therapeutic Exercise: to improve strength, ROM, flexibility, and endurance  Nustep L2x84min UE/LE LTR both ways x 10 bil NEUROMUSCULAR RE-EDUCATION: To improve proprioception, balance, and kinesthesia. Supine PPT- patient started to feel like she needed to vomit Seated PPT 10x3 Seated PPT with hip ADD ball squeeze 10x3 Seated PPT with hip ABD RTB 10x3  [facility] PT Assessment - 10/28/23 0001       Standardized Balance Assessment   Standardized Balance Assessment Berg Balance Test;Timed Up and Go Test      Berg Balance Test   Sit to Stand Able to stand  independently using hands    Standing Unsupported Able to stand safely 2 minutes    Sitting with Back Unsupported but Feet Supported on Floor or Stool Able to sit safely and securely 2 minutes    Stand to Sit Sits safely with minimal use of hands    Transfers Able to transfer safely, minor use of hands    Standing Unsupported with Eyes Closed Able to stand 10 seconds safely    Standing Unsupported with Feet Together Able to place feet together independently and stand 1 minute safely    From Standing, Reach Forward with Outstretched Arm Can reach forward >12 cm safely (5)    From Standing Position, Pick up Object from Floor Able to pick up shoe safely and easily    From Standing Position, Turn to Look Behind Over each Shoulder Turn  sideways only but maintains balance    Turn 360 Degrees Able to turn 360 degrees safely in 4 seconds or less    Standing Unsupported, Alternately Place Feet on Step/Stool Able to stand independently and complete 8 steps >20 seconds    Standing Unsupported, One Foot in Front Able to take small step independently and hold 30 seconds    Standing on One Leg Able to lift leg independently and hold 5-10 seconds    Total Score 48             10/13/2023  SELF CARE:  Reviewed eval findings and role of PT in addressing identified deficits as well as need for further assessment.  PATIENT EDUCATION:  Education details: initial HEP  Person educated: Patient Education method: Explanation Education comprehension: verbalized understanding  HOME EXERCISE PROGRAM: Access Code: [email or web address] URL: [email or web address] Date: 11/10/2023 Prepared by: Shjon Lizarraga  Exercises - Seated Pelvic Tilt  - 1 x daily - 7 x weekly - 2 sets - 10 reps - 3 sec hold - Seated Hip Adduction Isometrics with Mercer  -  1 x daily - 7 x weekly - 2 sets - 10 reps - 3 sec hold - Seated Hip Abduction with Resistance  - 1 x daily - 7 x weekly - 2 sets - 10 reps - 3 sec hold - Seated March with Resistance  - 1 x daily - 7 x weekly - 2 sets - 10 reps - Mini Squat with Counter Support  - 1 x daily - 7 x weekly - 2 sets - 10 reps - 3 second hold   ASSESSMENT:  CLINICAL IMPRESSION: Progressed through standing exercises for hip strengthening and progressed HEP as well. Pt improved score on 5xSTS but has yet to meet STG #3. She is I with initial HEP. Also educated her on reducing fall risk. STGs #1 and #2 are met. Will continue vestibular treatment next session.   OBJECTIVE IMPAIRMENTS: Abnormal gait, decreased activity tolerance, decreased balance, decreased knowledge of condition, decreased mobility, difficulty walking, decreased ROM, decreased strength, dizziness, impaired perceived functional ability, increased muscle  spasms, impaired flexibility, impaired sensation, improper body mechanics, postural dysfunction, and pain.   ACTIVITY LIMITATIONS: carrying, lifting, bending, standing, squatting, sleeping, stairs, transfers, bathing, dressing, and locomotion level  PARTICIPATION LIMITATIONS: meal prep, cleaning, laundry, driving, shopping, and community activity  PERSONAL FACTORS: Age, Fitness, Past/current experiences, Time since onset of injury/illness/exacerbation, and 3+ comorbidities: Anxiety, CAD, CHF, COPD, cervical spondylosis s/p single level fusion, DM-II, diabetic neuropathy, depression, GERD, HTN, obesity, osteopenia/osteoporsis, lumbar laminectomy, ORIF R wrist fx 11/2021 are also affecting patient's functional outcome.   REHAB POTENTIAL: Good  CLINICAL DECISION MAKING: Evolving/moderate complexity  EVALUATION COMPLEXITY: Moderate   GOALS: Goals reviewed with patient? Yes  SHORT TERM GOALS: Target date: 11/10/2023  Patient will be independent with initial HEP to improve outcomes and carryover.  Baseline:  Goal status: MET- 11/10/23  2.  Patient will be educated on strategies to decrease risk of falls.  Baseline:  Goal status: MET- 11/10/23  3.  Patient will improve 5xSTS time to </= 15 seconds for improved efficiency and safety with transfers. Baseline: 21.91 sec Goal status: IN PROGRESS- 17.73 seconds - 11/10/23  LONG TERM GOALS: Target date: 01/05/2024  Patient will be independent with advanced/ongoing HEP to facilitate ability to maintain/progress functional gains from skilled physical therapy services. Baseline:  Goal status: IN PROGRESS  2.  Patient will be able to ambulate 600' w/o LRAD on variable surfaces with good safety to access community.  Baseline:  Goal status: IN PROGRESS  3.  Patient will be able to step up/down curb safely with LRAD for safety with community ambulation.  Baseline:  Goal status: IN PROGRESS   4.  Patient will demonstrate improved B LE strength to  >/= 4+/5 for improved stability and ease of mobility . Baseline: Refer to above LE MMT table Goal status: IN PROGRESS  5.  Patient will demonstrate decreased TUG time to </= 13.5 sec to decrease risk for falls with transitional mobility. Baseline: TBA Goal status: IN PROGRESS   6.  Patient will improve Berg score by at least 8 points to improve safety and stability with ADLs in standing and reduce risk for falls. (MCID= 8 points)  Baseline: 48 Goal status: IN PROGRESS  7.  Patient will improve FGA score to at least 19/30 to improve gait stability and reduce risk for falls.  REVISED GOAL 11/05/23: Patient will improve FGA to >/=26/30 to demo MCID Baseline: INITIAL GOAL MET- 11/03/23 with 22/30 Goal status: NEW  8.  Patient will report >/= 50% on  ABC scale (MCID = 19%) to demonstrate improved balance confidence with functional mobility and gait. Baseline: 590 / 1600 = 36.9 % Goal status: IN PROGRESS  9.  Patient will report </= 14% on Modified Oswestry (MCID = 12%) to demonstrate improved functional ability with decreased pain interference. Baseline: 13 / 50 = 26.0 % Goal status: IN PROGRESS   10. Pt will have no s/s of nystagmus or dizziness with Canalith testing to demo resolution of her BPPV Baseline: (+) R and L ear for posterior canalithiasis Goal status: NEW  PLAN:  PT FREQUENCY: 2x/week  PT DURATION: 8-12 weeks  PLANNED INTERVENTIONS: [id number]- PT Re-evaluation, 97110-Therapeutic exercises, 97530- Therapeutic activity, 97112- Neuromuscular re-education, 97535- Self Care, [id number]- Manual therapy, [id number]- Gait training, [phone]- Canalith repositioning, [id number]- Aquatic Therapy, [id number]- Electrical stimulation (unattended), [id number]- Electrical stimulation (manual), [id number]- Ultrasound, 02987- Traction (mechanical), [id number]- Ionotophoresis 4mg /ml Dexamethasone , Patient/Family education, Balance training, Stair training, Taping, Dry Needling, Joint mobilization, Spinal mobilization, Vestibular  training, Visual/preceptual remediation/compensation, Cryotherapy, Moist heat, and 97750- Physical Performance Test or Measurement  PLAN FOR NEXT SESSION:  iprogress lumbopelvic flexibility and strengthening(does not do well lying down); MT +/- TPDN to address abnormal muscle tension/tightness and pain; Possible vestibular assessment as indicated   Sol LITTIE Gaskins, PTA 11/10/2023, 8:57 AM    PHYSICAL THERAPY DISCHARGE SUMMARY  Visits from Start of Care: 5  Current functional level related to goals / functional outcomes: Refer to above clinical impression and goal assessment for status as of last visit on 11/10/2023. Patient canceled her next 3 scheduled visits and did not schedule any further visits.  She has not returned to PT in >30 days, therefore will proceed with discharge from PT for this episode.     Remaining deficits: As above. Unable to formally assess status at discharge due to failure to return to PT.    Education / Equipment: HEP  Patient agrees to discharge. Patient goals were not met. Patient is being discharged due to not returning since the last visit.  Elijah EMERSON Hidden, PT 01/29/2024, 12:18 PM  [facility] [address]  [address] [place], [place], [address] Phone: [phone]   Fax:  [phone]

## 2023-11-12 ENCOUNTER — Encounter: Admitting: Physical Therapy

## 2023-11-13 ENCOUNTER — Ambulatory Visit

## 2023-11-13 ENCOUNTER — Other Ambulatory Visit: Payer: Self-pay

## 2023-11-13 ENCOUNTER — Telehealth (HOSPITAL_BASED_OUTPATIENT_CLINIC_OR_DEPARTMENT_OTHER): Payer: Self-pay

## 2023-11-13 ENCOUNTER — Emergency Department (HOSPITAL_BASED_OUTPATIENT_CLINIC_OR_DEPARTMENT_OTHER)

## 2023-11-13 ENCOUNTER — Emergency Department (HOSPITAL_BASED_OUTPATIENT_CLINIC_OR_DEPARTMENT_OTHER)
Admission: EM | Admit: 2023-11-13 | Discharge: 2023-11-13 | Disposition: A | Discharge status: Home / Self Care | Attending: Emergency Medicine | Admitting: Emergency Medicine

## 2023-11-13 DIAGNOSIS — I110 Hypertensive heart disease with heart failure: Secondary | ICD-10-CM | POA: Insufficient documentation

## 2023-11-13 DIAGNOSIS — M542 Cervicalgia: Secondary | ICD-10-CM | POA: Diagnosis present

## 2023-11-13 DIAGNOSIS — M436 Torticollis: Secondary | ICD-10-CM | POA: Diagnosis not present

## 2023-11-13 DIAGNOSIS — M47812 Spondylosis without myelopathy or radiculopathy, cervical region: Secondary | ICD-10-CM | POA: Diagnosis not present

## 2023-11-13 DIAGNOSIS — Z794 Long term (current) use of insulin: Secondary | ICD-10-CM | POA: Diagnosis not present

## 2023-11-13 DIAGNOSIS — I2510 Atherosclerotic heart disease of native coronary artery without angina pectoris: Secondary | ICD-10-CM | POA: Diagnosis not present

## 2023-11-13 DIAGNOSIS — J441 Chronic obstructive pulmonary disease with (acute) exacerbation: Secondary | ICD-10-CM | POA: Insufficient documentation

## 2023-11-13 DIAGNOSIS — E1140 Type 2 diabetes mellitus with diabetic neuropathy, unspecified: Secondary | ICD-10-CM | POA: Diagnosis not present

## 2023-11-13 DIAGNOSIS — I5032 Chronic diastolic (congestive) heart failure: Secondary | ICD-10-CM | POA: Insufficient documentation

## 2023-11-13 DIAGNOSIS — M5030 Other cervical disc degeneration, unspecified cervical region: Secondary | ICD-10-CM | POA: Diagnosis not present

## 2023-11-13 DIAGNOSIS — Z87442 Personal history of urinary calculi: Secondary | ICD-10-CM | POA: Diagnosis not present

## 2023-11-13 DIAGNOSIS — Z981 Arthrodesis status: Secondary | ICD-10-CM | POA: Diagnosis not present

## 2023-11-13 DIAGNOSIS — S199XXA Unspecified injury of neck, initial encounter: Secondary | ICD-10-CM | POA: Diagnosis not present

## 2023-11-13 MED ORDER — KETOROLAC TROMETHAMINE 15 MG/ML IJ SOLN
15.0000 mg | Freq: Once | INTRAMUSCULAR | Status: AC
  Administered 2023-11-13: 15 mg via INTRAMUSCULAR
  Filled 2023-11-13: qty 1

## 2023-11-13 MED ORDER — METHOCARBAMOL 500 MG PO TABS: 500.0000 mg | ORAL_TABLET | Freq: Three times a day (TID) | ORAL | 0 refills | Status: AC | PRN

## 2023-11-13 MED ORDER — LIDOCAINE 5 % EX OINT: 1.0000 | TOPICAL_OINTMENT | Freq: Four times a day (QID) | CUTANEOUS | 0 refills | Status: AC | PRN

## 2023-11-13 MED ORDER — LIDOCAINE 5 % EX PTCH
1.0000 | MEDICATED_PATCH | CUTANEOUS | Status: DC
  Administered 2023-11-13: 1 via TRANSDERMAL
  Filled 2023-11-13: qty 1

## 2023-11-13 NOTE — ED Provider Notes (Signed)
 Emergency Department Provider Note   I have reviewed the triage vital signs and the nursing notes.   HISTORY  Chief Complaint Neck Pain   HPI Destiny Ballard is a 73 y.o. female presents to the emergency department with left neck pain.  She woke up with symptoms yesterday.  Pain is the left lateral neck to the shoulder.  She has baseline tinnitus which is not worse.  No severe sore throat or headache.  No vision changes.  No fevers.  She does not recall specific injury.  Pain is worse with turning her head to the left.  No numbness/weakness in the extremities.  Past Medical History:  Diagnosis Date   Anxiety    Prior suicide attempt   CAD (coronary artery disease)    a) s/p DES to LAD 07/2005 b) Last Myoview  low risk 11/2011 showing small fixed apical perfusion defect (prior MI vs attenuation) but no ischemia - normal EF.   Cervical spondylosis    Chest pain 12/10/2011   Chronic diastolic CHF (congestive heart failure) (HCC) 09/18/2017   Chronic eczematous otitis externa of both ears 11/18/2019   Chronic rhinitis 08/07/2016   Complication of anesthesia    developed pneumonia after nerve block was hospitalized   Constipation 12/18/2020   COPD with acute exacerbation (HCC) 04/25/2016   Coronary atherosclerosis 06/28/2008   Depression    Depression with anxiety    Diabetes mellitus (HCC) 09/08/2017   Diabetic neuropathy (HCC)    Dyspnea on exertion 09/02/2012   CXR 07/2012:  No acute process.     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 Respiratory: Denies shortness of breath. Gastrointestinal: No abdominal pain.  Musculoskeletal: Positive left neck pain.  Skin: Negative for rash. Neurological: Negative for headaches, focal weakness or numbness.  ____________________________________________   PHYSICAL EXAM:  VITAL SIGNS: ED Triage Vitals  Encounter Vitals Group     BP 11/13/23 0727 (!) 182/87     Pulse Rate 11/13/23 0727 94     Resp 11/13/23 0727 19     Temp 11/13/23 0727 99.9 F (37.7 C)     Temp Source 11/13/23 0727 Oral     SpO2 11/13/23 0727 93 %   Constitutional: Alert and oriented. Well appearing  and in no acute distress. Eyes: Conjunctivae are normal. Head: Atraumatic. Nose: No congestion/rhinnorhea. Mouth/Throat: Mucous membranes are moist.  Oropharynx non-erythematous. Neck: No stridor. No cervical spine tenderness to palpation.  Mild tenderness to palpation over the left trapezius. Cardiovascular: Normal rate, regular rhythm. Good peripheral circulation. Grossly normal heart sounds.   Respiratory: Normal respiratory effort.  No retractions. Lungs CTAB. Gastrointestinal: No distention.  Musculoskeletal: No  gross deformities of extremities. Neurologic:  Normal speech and language. No gross focal neurologic deficits are appreciated.  Skin:  Skin is warm, dry and intact. No rash noted.  ____________________________________________  RADIOLOGY  CT Cervical Spine Wo Contrast Result Date: 11/13/2023 CLINICAL DATA:  Neck trauma (Age >= 65y). Left lateral neck and shoulder pain since yesterday. EXAM: CT CERVICAL SPINE WITHOUT CONTRAST TECHNIQUE: Multidetector CT imaging of the cervical spine was performed without intravenous contrast. Multiplanar CT image reconstructions were also generated. RADIATION DOSE REDUCTION: This exam was performed according to the departmental dose-optimization program which includes automated exposure control, adjustment of the mA and/or kV according to patient size and/or use of iterative reconstruction technique. COMPARISON:  Cervical spine CT 07/30/2005 and MRI 11/05/2005 FINDINGS: Alignment: Straightening/mild reversal of the normal cervical lordosis. Trace anterolisthesis of C5 on C6 and C7 on T1. Skull base and vertebrae: No acute fracture or suspicious lesion. Interval C4-5 ACDF with solid interbody arthrodesis as well as bilateral facet ankylosis. Soft tissues and spinal canal: No prevertebral fluid or swelling. No visible canal hematoma. Disc levels: C2-3: Mild left uncovertebral spurring and severe left facet arthrosis without significant stenosis. C3-4: Severe disc space narrowing. A broad-based posterior disc osteophyte complex and mild facet arthrosis result in mild-to-moderate spinal stenosis and moderate to severe bilateral neural foraminal stenosis. C4-5: ACDF. Mild osseous neural foraminal narrowing on the right. No evidence of significant spinal canal or left neural foraminal stenosis. C5-6: Anterolisthesis, a broad-based posterior disc osteophyte complex, left greater than right uncovertebral spurring, and severe left facet arthrosis result in suspected mild spinal  stenosis and moderate left neural foraminal stenosis. C6-7: A broad-based posterior disc osteophyte complex results in suspected mild spinal stenosis and mild right neural foraminal stenosis. C7-T1: Spondylosis with anterior vertebral spurring. No evidence of significant stenosis. The above described degenerative changes have progressed from the remote comparison studies. Upper chest: No mass or consolidation in the included lung apices. Other: None. IMPRESSION: 1. No acute cervical spine fracture. 2. Solid C4-5 ACDF. 3. Progressive multilevel cervical disc and facet degeneration, most notable at C3-4 where there is mild-to-moderate spinal stenosis and moderate to severe bilateral neural foraminal stenosis. Electronically Signed   By: Aundra Lee M.D.   On: 11/13/2023 09:10    ____________________________________________   PROCEDURES  Procedure(s) performed:   Procedures  None  ____________________________________________   INITIAL IMPRESSION / ASSESSMENT AND PLAN / ED COURSE  Pertinent labs & imaging results that were available during my care of the patient were reviewed by me and considered in my medical decision making (see chart for details).   This patient is Presenting for Evaluation of neck pain, which does require a range of treatment options, and is a complaint that involves a moderate risk of morbidity and mortality.  The Differential Diagnoses include torticollis, cervical strain, fracture, bone metastatic disease, etc.  Critical Interventions-    Medications  lidocaine  (LIDODERM ) 5 % 1 patch (1 patch Transdermal Patch Applied 11/13/23 0748)  ketorolac  (TORADOL ) 15 MG/ML injection 15 mg (15 mg Intramuscular Given 11/13/23 0748)    Reassessment after intervention: pain  improved.   Radiologic Tests Ordered, included CT c spine. I independently interpreted the images and agree with radiology interpretation.  Medical Decision Making: Summary:  The patient presents emergency  department with cervical strain.  Pain worse with movement.  No appreciable midline bony tenderness but given age plan for CT C-spine, low-dose IM Toradol , Lidoderm  patch, reassess.  Reevaluation with update and discussion with patient. Plan for Robaxin  to use mainly at night. Discussed the side effect profile of these medications. Will follow with PCP and Spine surgery if symptoms worsen.   Patient's presentation is most consistent with acute presentation with potential threat to life or bodily function.   Disposition: discharge  ____________________________________________  FINAL CLINICAL IMPRESSION(S) / ED DIAGNOSES  Final diagnoses:  Torticollis     NEW OUTPATIENT MEDICATIONS STARTED DURING THIS VISIT:  New Prescriptions   LIDOCAINE  (XYLOCAINE ) 5 % OINTMENT    Apply 1 Application topically 4 (four) times daily as needed.   METHOCARBAMOL  (ROBAXIN ) 500 MG TABLET    Take 1 tablet (500 mg total) by mouth every 8 (eight) hours as needed for muscle spasms.    Note:  This document was prepared using Dragon voice recognition software and may include unintentional dictation errors.  Abby Hocking, MD, [facility] Emergency Medicine    Maleeyah Mccaughey, Shereen Dike, MD 11/13/23 [phone]

## 2023-11-13 NOTE — Discharge Instructions (Signed)
 Please use the Robaxin  with caution. I do not want you taking this if you will be up and awake. It can make you sleepy and make it more likely for you to fall. Please use mainly at night to get some sleep.   Please follow with your PCP in the coming week. I have also listed th name of a spine surgery practice to call for follow up if your symptoms continue to worsen.   Return to the ED with any weakness/numbness in the arms/leg or severe headache.

## 2023-11-13 NOTE — ED Triage Notes (Signed)
 Pt reports neck pain that developed yesterday.  Pt reports hx of V4 and V5 surgery.

## 2023-11-14 DIAGNOSIS — M542 Cervicalgia: Secondary | ICD-10-CM | POA: Diagnosis not present

## 2023-11-17 ENCOUNTER — Ambulatory Visit: Admitting: Physical Therapy

## 2023-11-17 ENCOUNTER — Telehealth: Payer: Self-pay

## 2023-11-17 ENCOUNTER — Ambulatory Visit

## 2023-11-17 NOTE — Transitions of Care (Post Inpatient/ED Visit) (Signed)
 11/17/2023  Name: Destiny Ballard MRN: [id number] DOB: 1950/10/29  Today's TOC FU Call Status: Today's TOC FU Call Status:: Successful TOC FU Call Completed TOC FU Call Complete Date: 11/17/23 Patient's Name and Date of Birth confirmed.  Transition Care Management Follow-up Telephone Call Date of Discharge: 11/13/23 Discharge Facility: [facility] Type of Discharge: Emergency Department Reason for ED Visit: Other: (Torticollis) How have you been since you were released from the hospital?: Better  Items Reviewed: Did you receive and understand the discharge instructions provided?: Yes Medications obtained,verified, and reconciled?: Yes (Medications Reviewed) Any new allergies since your discharge?: No Dietary orders reviewed?: NA Do you have support at home?: Yes  Medications Reviewed Today: Medications Reviewed Today     Reviewed by Chauncy Coral, CMA (Certified Medical Assistant) on 11/17/23 at 1622  Med List Status: <None>   Medication Order Taking? Sig Documenting Provider Last Dose Status Informant  albuterol  (VENTOLIN  HFA) 108 (90 Base) MCG/ACT inhaler [id number]  Inhale 2 puffs into the lungs every 8 (eight) hours as needed for wheezing or shortness of breath. Copland, Skipper Dumas, MD  Active   aspirin  EC 81 MG tablet [id number] No Take 81 mg by mouth daily. [provider] Taking Active Self  calcium  carbonate (TUMS EX) 750 MG chewable tablet [id number] No Chew 1 tablet by mouth daily as needed for heartburn. [provider] Taking Active Self  chlorhexidine  (PERIDEX ) 0.12 % solution [id number]  SMARTSIG:0.5 Ounce(s) By Mouth Twice Daily [provider]  Active   cholecalciferol  (VITAMIN D3) 25 MCG (1000 UNIT) tablet [id number] No Take 2,000 Units by mouth daily. [provider] Taking Active Self  clindamycin  (CLEOCIN ) 300 MG capsule [id number]  Take 300 mg by mouth 2 (two) times daily. [provider]  Active    Continuous Glucose Receiver (FREESTYLE LIBRE 3 READER) DEVI [id number] No by Does not apply route. Free style brand Use to check blood glucose continuously. Change sensor every 10 day as of 11/18/22 Copland, Skipper Dumas, MD Taking Active   Continuous Glucose Sensor (FREESTYLE LIBRE 3 SENSOR) [place] [id number] No by Does not apply route. Free style brand Use to check blood glucose continuously. Change sensor every 10 day as of 11/18/22 Copland, Skipper Dumas, MD Taking Active   cyanocobalamin  (VITAMIN B12) 1000 MCG tablet [id number] No Take 3,000 mcg by mouth daily. [provider] Taking Active   dapagliflozin  propanediol (FARXIGA ) 10 MG TABS tablet [id number] No Take 1 tablet (10 mg total) by mouth daily. Copland, Skipper Dumas, MD Taking Active   Dulaglutide  (TRULICITY ) 1.5 MG/0.5ML SOAJ [id number] No Inject 1.5 mg into the skin once a week.  Patient not taking: Reported on 09/29/2023   Copland, Skipper Dumas, MD Not Taking Active   famotidine  (PEPCID ) 40 MG tablet [id number] No Take 1 tablet (40 mg total) by mouth 2 (two) times daily. Asencion Blacksmith, MD Taking Active   fexofenadine [facility]) 180 MG tablet [id number] No Take 180 mg by mouth daily. [provider] Taking Active Self  FLUoxetine  (PROZAC ) 40 MG capsule [id number] No TAKE 1 CAPSULE (40 MG TOTAL) BY MOUTH DAILY. Copland, Skipper Dumas, MD Taking Active   furosemide  (LASIX ) 20 MG tablet [id number] No Take 1 tablet (20 mg total) by mouth daily. For swelling / fluid retention  Patient taking differently: Take 20 mg by mouth daily as needed for edema. For swelling / fluid retention   Copland, Skipper Dumas, MD Taking Active   gabapentin  (NEURONTIN ) 300 MG capsule [id number] No Take 2  capsules (600 mg total) by mouth 2 (two) times daily. Copland, Skipper Dumas, MD Taking Active   hydrALAZINE  (APRESOLINE ) 25 MG tablet [id number] No TAKE 2 TABLETS BY MOUTH 2 TIMES DAILY. FOR BLOOD PRESSURE  Patient taking differently: Take 25 mg by mouth in the morning and at  bedtime.   Copland, Skipper Dumas, MD Taking Active   Insulin  Glargine (BASAGLAR  KWIKPEN) 100 UNIT/ML [id number] No Inject 30 Units into the skin daily. Copland, Skipper Dumas, MD Taking Active   Insulin  Pen Needle 32G X 4 MM MISC [id number] No 1 Device by Does not apply route daily. Shamleffer, Julian Obey, MD Taking Active Self  Lancets (FREESTYLE) lancets [id number] No Use to check blood glucose once a day  Patient not taking: Reported on 10/20/2023   Copland, Skipper Dumas, MD Not Taking Active   lidocaine  (XYLOCAINE ) 5 % ointment [id number]  Apply 1 Application topically 4 (four) times daily as needed. Long, Shereen Dike, MD  Active   metFORMIN  (GLUCOPHAGE -XR) 500 MG 24 hr tablet [id number] No Take 2 tablets (1,000 mg total) by mouth 2 (two) times daily. Copland, Skipper Dumas, MD Taking Active   methocarbamol  (ROBAXIN ) 500 MG tablet [id number]  Take 1 tablet (500 mg total) by mouth every 8 (eight) hours as needed for muscle spasms. Long, Joshua G, MD  Active   metoprolol  succinate (TOPROL -XL) 100 MG 24 hr tablet [id number] No Take 1 tablet (100 mg total) by mouth daily. Copland, Skipper Dumas, MD Taking Active   montelukast  (SINGULAIR ) 10 MG tablet [id number] No Take 1 tablet (10 mg total) by mouth at bedtime. Copland, Skipper Dumas, MD Taking Active   nitroGLYCERIN  (NITROSTAT ) 0.4 MG SL tablet [id number] No Place 1 tablet (0.4 mg total) under the tongue every 5 (five) minutes as needed for chest pain. Copland, Skipper Dumas, MD Taking Active   OVER THE COUNTER MEDICATION [id number] No Take 1 each by mouth daily. CBD gummy  Patient not taking: Reported on 10/20/2023   [provider] Not Taking Active   pantoprazole  (PROTONIX ) 40 MG tablet [id number] No TAKE 1 TABLET BY MOUTH TWICE A DAY Lemmon, Kathy Parker, [place] Taking Active   rosuvastatin  (CRESTOR ) 20 MG tablet [id number]  Take 1 tablet (20 mg total) by mouth daily. Copland, Skipper Dumas, MD  Active   Tiotropium Bromide  Monohydrate (SPIRIVA  RESPIMAT) 2.5 MCG/ACT AERS  [id number] No INHALE 2 PUFFS BY MOUTH INTO THE LUNGS DAILY  Patient not taking: Reported on 10/20/2023   Copland, Skipper Dumas, MD Not Taking Active            Med Note Katheryn Pandy   Tue Aug 06, 2022  9:31 AM) Not taking every day  valsartan  (DIOVAN ) 160 MG tablet [id number] No TAKE 1 TABLET BY MOUTH EVERY DAY Copland, Jessica C, MD Taking Active   zinc  gluconate 50 MG tablet [id number] No Take 50 mg by mouth daily.  Patient not taking: Reported on 10/13/2023   [provider] Not Taking Active Self  Med List Note (Copland, Skipper Dumas, MD 03/12/21 1526): Lorazepam  rarely.            Home Care and Equipment/Supplies: Were Home Health Services Ordered?: No Any new equipment or medical supplies ordered?: No  Functional Questionnaire: Do you need assistance with bathing/showering or dressing?: No Do you need assistance with meal preparation?: No Do you need assistance with eating?: No Do you have difficulty maintaining continence: No Do you need assistance with getting out of bed/getting out of a chair/moving?: No Do you  have difficulty managing or taking your medications?: No  Follow up appointments reviewed: PCP Follow-up appointment confirmed?: NA (declined-following up with Specialist) Specialist Hospital Follow-up appointment confirmed?: Yes Date of Specialist follow-up appointment?: 12/12/23 Follow-Up Specialty Provider:: [facility] Do you need transportation to your follow-up appointment?: No Do you understand care options if your condition(s) worsen?: Yes-patient verbalized understanding    SIGNATURE  Germain Kohler, CMA (AAMA)  [facility] [phone]

## 2023-11-17 NOTE — Progress Notes (Deleted)
  Start: *** end: ***   Lab Results  Component Value Date   HGBA1C 8.4 (H) 07/30/2023    Lab Results  Component Value Date   CHOL 261 (H) 07/30/2023   HDL 51.30 07/30/2023   LDLCALC 135 (H) 07/30/2023   LDLDIRECT 51.0 09/18/2022   TRIG 377.0 (H) 07/30/2023   CHOLHDL 5 07/30/2023      10/17/2023  Increase water intake  Aim for balanced meals and snacks  Walking in the neighborhood as tolerated    Patient is here today alone. Pt is wearing libre sensor and states reader is at home. Patient would like to learn more about healthy eating for blood sugars. Patient lives with her daughter.  Pt reports she does her own shopping and cooking. Pt reports eating out about 2-3 times weekly. Pt reports she has sleep apnea and denies CPAP/biPAP use. Pt reports her blood sugar was "153" this morning before breakfast. Pt reports weekly GLP has decreased appetite. Pt reports she stress eats at times. Pt reports she has a safe neighborhood and would like to start walking. All Pt's questions were answered during this encounter.

## 2023-11-20 ENCOUNTER — Telehealth: Payer: Self-pay | Admitting: Pharmacist

## 2023-11-20 NOTE — Telephone Encounter (Signed)
 Called to follow up with patient regarding home blood glucose / Continuous Glucose Monitor reading and see if she has tolerated increase dose of Trulicity .  Unable to reach patient/LM on VM with CB# [phone]

## 2023-11-21 ENCOUNTER — Ambulatory Visit: Admitting: Dietician

## 2023-11-21 DIAGNOSIS — Z794 Long term (current) use of insulin: Secondary | ICD-10-CM

## 2023-11-21 DIAGNOSIS — Z7985 Long-term (current) use of injectable non-insulin antidiabetic drugs: Secondary | ICD-10-CM

## 2024-01-10 ENCOUNTER — Other Ambulatory Visit: Payer: Self-pay | Admitting: Family Medicine

## 2024-01-10 DIAGNOSIS — R600 Localized edema: Secondary | ICD-10-CM

## 2024-01-21 ENCOUNTER — Other Ambulatory Visit: Payer: Self-pay | Admitting: Family Medicine

## 2024-01-21 DIAGNOSIS — E785 Hyperlipidemia, unspecified: Secondary | ICD-10-CM

## 2024-01-21 DIAGNOSIS — R600 Localized edema: Secondary | ICD-10-CM

## 2024-01-31 NOTE — Progress Notes (Deleted)
 [name] [address], [address] [place], [place] [address] [phone]  Date:  02/04/2024   Name:  Destiny Ballard   DOB:  1951/05/06   MRN:  [id number]  PCP:  Watt Harlene BROCKS, MD    Chief Complaint: No chief complaint on file.   History of Present Illness:  Destiny Ballard is a 73 y.o. very pleasant female patient who presents with the following:  Patient seen today for concern of earache.  Most recent visit with myself was in January -history of CAD (stents 2007, most recent cath 2019 negative) with cardiology care, hypertension, CHF, sleep apnea, COPD, diabetes, GERD, hyperlipidemia  We have had some difficulty controlling her blood sugar, her most recent A1c was higher than goal I reached out to her via MyChart recommending we get her back on a GLP-1 and cholesterol medication I did prescribe Crestor  for her  Lab Results  Component Value Date   HGBA1C 8.4 (H) 07/30/2023   Due for foot exam Can update A1c and urine micro Most recent mammogram was several years ago-?  Would she be willing to update Colonoscopy 2023  Patient Active Problem List   Diagnosis Date Noted   Diabetes mellitus (HCC) 03/01/2022   Hypoxia 11/27/2021   Osteopenia 06/27/2021   Melena 12/18/2020   History of colonic polyps 12/18/2020   Constipation 12/18/2020   Obesity    Iron deficiency anemia    Insulin  resistance    Hyperlipidemia    Cervical spondylosis    Anxiety    Pneumonia due to COVID-19 virus 08/21/2020   Hyperlipidemia associated with type 2 diabetes mellitus (HCC) 08/21/2020   Type 2 diabetes mellitus with diabetic polyneuropathy, with long-term current use of insulin  (HCC) 12/10/2019   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   Tinnitus of both ears 11/18/2019   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   Eustachian tube dysfunction, bilateral 11/18/2019   Chronic eczematous otitis  externa of both ears 11/18/2019   Obesity (BMI 30-39.9) 08/24/2018   Depression with anxiety 08/24/2018   CAD (coronary artery disease) 08/24/2018   Abnormal cardiovascular stress test 08/24/2018   Precordial chest pain    Sepsis (HCC) 09/18/2017   Chronic diastolic CHF (congestive heart failure) (HCC) 09/18/2017   Chronic rhinitis 08/07/2016   COPD with acute exacerbation (HCC) 04/25/2016   Hypertension associated with diabetes (HCC) 01/30/2016   Diarrhea 09/26/2015   OSA (obstructive sleep apnea) 05/06/2014   Dyspnea on exertion 09/02/2012   GERD 06/28/2008    Past Medical History:  Diagnosis Date   Anxiety    Prior suicide attempt   CAD (coronary artery disease)    a) s/p DES to LAD 07/2005 b) Last Myoview  low risk 11/2011 showing small fixed apical perfusion defect (prior MI vs attenuation) but no ischemia - normal EF.   Cervical spondylosis    Chest pain 12/10/2011   Chronic diastolic CHF (congestive heart failure) (HCC) 09/18/2017   Chronic eczematous otitis externa of both ears 11/18/2019   Chronic rhinitis 08/07/2016   Complication of anesthesia    developed pneumonia after nerve block was hospitalized   Constipation 12/18/2020   COPD with acute exacerbation (HCC) 04/25/2016   Coronary atherosclerosis 06/28/2008   Depression    Depression with anxiety    Diabetes mellitus (HCC) 09/08/2017   Diabetic neuropathy (HCC)    Dyspnea on exertion 09/02/2012   CXR 07/2012:  No acute process.  Eustachian tube dysfunction, bilateral 11/18/2019   GERD 06/28/2008   Hearing loss    Left   History of colonic polyps 12/18/2020   History of kidney stones    HLD (hyperlipidemia)    Hyperlipidemia associated with type 2 diabetes mellitus (HCC) 08/21/2020   Hypertension    Hypertension associated with diabetes (HCC) 01/30/2016   Hypokalemia 01/27/2016   Insulin  resistance    Iron deficiency anemia    Leukocytosis    history of mild leukocytosis   Lobar pneumonia, unspecified  organism (HCC) 11/13/2017   Melena 12/18/2020   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   Obesity (BMI 30-39.9) 08/24/2018   Last Assessment & Plan:  Exercise and weight reduction is encouraged.   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 EF 60%   CARDIAC CATHETERIZATION N/A 01/29/2016   Procedure: Left Heart Cath and Coronary Angiography;  Surgeon: Ozell Fell, MD;  Location: [facility] INVASIVE CV LAB;  Service: Cardiovascular;  Laterality: N/A;   CERVICAL SPONDYLOSIS     SINGLE LEVEL FUSION   CHILDBIRTH     X3   COLONOSCOPY  2010   normal   COLONOSCOPY WITH PROPOFOL   10/01/2021   CORONARY STENT PLACEMENT  07/15/2005   LEFT ANTERIOR DESCENDING   FOREARM FRACTURE SURGERY  07/15/2008   hand and shoulder    INCISION AND DRAINAGE BREAST ABSCESS  01/05/2012       INCISION AND DRAINAGE PERIRECTAL ABSCESS N/A 02/18/2014   Procedure: IRRIGATION AND DEBRIDEMENT PERIRECTAL ABSCESS;  Surgeon: Donnice KATHEE Lunger, MD;  Location: [facility] ORS;  Service: General;  Laterality: N/A;   LEFT HEART CATH AND CORONARY ANGIOGRAPHY N/A 10/10/2017   Procedure: LEFT HEART CATH AND CORONARY ANGIOGRAPHY;  Surgeon: Verlin Lonni BIRCH, MD;  Location: [facility] INVASIVE CV LAB;  Service: Cardiovascular;  Laterality: N/A;   LUMBAR LAMINECTOMY     ORIF WRIST FRACTURE Right 11/27/2021   Procedure: OPEN REDUCTION INTERNAL FIXATION (ORIF) WRIST  FRACTURE;  Surgeon: Josefina Chew, MD;  Location: [facility];  Service: Orthopedics;  Laterality: Right;   ROTATOR CUFF REPAIR     bilaterla   TONSILLECTOMY AND ADENOIDECTOMY     TUBAL LIGATION     VIDEO BRONCHOSCOPY Bilateral 08/13/2016   Procedure: VIDEO BRONCHOSCOPY WITHOUT FLUORO;  Surgeon: Lamar GORMAN Chris, MD;  Location: [facility];  Service: Cardiopulmonary;  Laterality: Bilateral;    Social History   Tobacco Use   Smoking status: Never    Passive exposure: Yes   Smokeless tobacco: Never  Vaping Use   Vaping status: Never Used  Substance Use Topics   Alcohol  use: Yes    Comment: occ   Drug use: No    Family History  Problem Relation Age of Onset   Heart attack Mother    Diabetes Mother    Lung cancer Mother    Asthma Mother    Heart disease Mother    Suicidality Father  killed himself   Diabetes Sister    Cancer Sister    Colon cancer Sister 45   Asthma Daughter        x 2   Cancer Daughter        pre-cancerous polyp   Cervical cancer Daughter        cervical    Allergies Other        all family--seasonal allergies    Allergies  Allergen Reactions   Prednisone  Other (See Comments)    REACTION: mood swings, nightmares. Shot doesn't bother me, reaction is just with the pill she states she has had the steroid injections before. From our records methylprednisone was given to her in 2013 without any complications.    Medication list has been reviewed and updated.  Current Outpatient Medications on File Prior to Visit  Medication Sig Dispense Refill   albuterol  (VENTOLIN  HFA) 108 (90 Base) MCG/ACT inhaler Inhale 2 puffs into the lungs every 8 (eight) hours as needed for wheezing or shortness of breath. 18 g 1   aspirin  EC 81 MG tablet Take 81 mg by mouth daily.     calcium  carbonate (TUMS EX) 750 MG chewable tablet Chew 1 tablet by mouth daily as needed for heartburn.     chlorhexidine  (PERIDEX ) 0.12 % solution SMARTSIG:0.5 Ounce(s) By Mouth Twice  Daily     cholecalciferol  (VITAMIN D3) 25 MCG (1000 UNIT) tablet Take 2,000 Units by mouth daily.     clindamycin  (CLEOCIN ) 300 MG capsule Take 300 mg by mouth 2 (two) times daily.     Continuous Glucose Receiver (FREESTYLE LIBRE 3 READER) DEVI by Does not apply route. Free style brand Use to check blood glucose continuously. Change sensor every 10 day as of 11/18/22     Continuous Glucose Sensor (FREESTYLE LIBRE 3 SENSOR) MISC by Does not apply route. Free style brand Use to check blood glucose continuously. Change sensor every 10 day as of 11/18/22     cyanocobalamin  (VITAMIN B12) 1000 MCG tablet Take 3,000 mcg by mouth daily.     dapagliflozin  propanediol (FARXIGA ) 10 MG TABS tablet Take 1 tablet (10 mg total) by mouth daily. 90 tablet 1   Dulaglutide  (TRULICITY ) 1.5 MG/0.5ML SOAJ Inject 1.5 mg into the skin once a week. (Patient not taking: Reported on 09/29/2023) 2 mL 1   famotidine  (PEPCID ) 40 MG tablet Take 1 tablet (40 mg total) by mouth 2 (two) times daily. 60 tablet 6   fexofenadine (ALLEGRA) 180 MG tablet Take 180 mg by mouth daily.     FLUoxetine  (PROZAC ) 40 MG capsule TAKE 1 CAPSULE (40 MG TOTAL) BY MOUTH DAILY. 90 capsule 1   furosemide  (LASIX ) 20 MG tablet Take 1 tablet (20 mg total) by mouth daily. Needs appt 30 tablet 0   gabapentin  (NEURONTIN ) 300 MG capsule Take 2 capsules (600 mg total) by mouth 2 (two) times daily. 360 capsule 1   hydrALAZINE  (APRESOLINE ) 25 MG tablet TAKE 2 TABLETS BY MOUTH 2 TIMES DAILY. FOR BLOOD PRESSURE (Patient taking differently: Take 25 mg by mouth in the morning and at bedtime.) 360 tablet 1   Insulin  Glargine (BASAGLAR  KWIKPEN) 100 UNIT/ML Inject 30 Units into the skin daily. 15 mL 2   Insulin  Pen Needle 32G X 4 MM MISC 1 Device by Does not apply route daily. 100 each 3   Lancets (FREESTYLE) lancets Use to check blood glucose once a day (Patient not taking: Reported on 10/20/2023) 100 each 5   lidocaine  (XYLOCAINE ) 5 % ointment  Apply 1 Application topically 4  (four) times daily as needed. 35 g 0   metFORMIN  (GLUCOPHAGE -XR) 500 MG 24 hr tablet Take 2 tablets (1,000 mg total) by mouth 2 (two) times daily. 360 tablet 1   methocarbamol  (ROBAXIN ) 500 MG tablet Take 1 tablet (500 mg total) by mouth every 8 (eight) hours as needed for muscle spasms. 20 tablet 0   metoprolol  succinate (TOPROL -XL) 100 MG 24 hr tablet Take 1 tablet (100 mg total) by mouth daily. 90 tablet 1   montelukast  (SINGULAIR ) 10 MG tablet Take 1 tablet (10 mg total) by mouth at bedtime. 90 tablet 1   nitroGLYCERIN  (NITROSTAT ) 0.4 MG SL tablet Place 1 tablet (0.4 mg total) under the tongue every 5 (five) minutes as needed for chest pain. 25 tablet 1   OVER THE COUNTER MEDICATION Take 1 each by mouth daily. CBD gummy (Patient not taking: Reported on 10/20/2023)     pantoprazole  (PROTONIX ) 40 MG tablet TAKE 1 TABLET BY MOUTH TWICE A DAY 180 tablet 1   rosuvastatin  (CRESTOR ) 20 MG tablet Take 1 tablet (20 mg total) by mouth daily. Needs appt 30 tablet 0   Tiotropium Bromide  Monohydrate (SPIRIVA  RESPIMAT) 2.5 MCG/ACT AERS INHALE 2 PUFFS BY MOUTH INTO THE LUNGS DAILY (Patient not taking: Reported on 10/20/2023) 4 g 5   valsartan  (DIOVAN ) 160 MG tablet TAKE 1 TABLET BY MOUTH EVERY DAY 90 tablet 1   zinc  gluconate 50 MG tablet Take 50 mg by mouth daily. (Patient not taking: Reported on 10/13/2023)     No current facility-administered medications on file prior to visit.    Review of Systems:  As per HPI- otherwise negative.   Physical Examination: There were no vitals filed for this visit. There were no vitals filed for this visit. There is no height or weight on file to calculate BMI. Ideal Body Weight:    GEN: no acute distress. HEENT: Atraumatic, Normocephalic.  Ears and Nose: No external deformity. CV: RRR, No M/G/R. No JVD. No thrill. No extra heart sounds. PULM: CTA B, no wheezes, crackles, rhonchi. No retractions. No resp. distress. No accessory muscle use. ABD: S, NT, ND, +BS. No  rebound. No HSM. EXTR: No c/c/e PSYCH: Normally interactive. Conversant.    Assessment and Plan: ***  Signed Harlene Schroeder, MD

## 2024-01-31 NOTE — Patient Instructions (Incomplete)
It was great to see you again today!  

## 2024-02-04 ENCOUNTER — Ambulatory Visit: Admitting: Family Medicine

## 2024-02-04 DIAGNOSIS — I152 Hypertension secondary to endocrine disorders: Secondary | ICD-10-CM

## 2024-02-04 DIAGNOSIS — I25118 Atherosclerotic heart disease of native coronary artery with other forms of angina pectoris: Secondary | ICD-10-CM

## 2024-02-04 DIAGNOSIS — Z7985 Long-term (current) use of injectable non-insulin antidiabetic drugs: Secondary | ICD-10-CM

## 2024-02-04 DIAGNOSIS — E782 Mixed hyperlipidemia: Secondary | ICD-10-CM

## 2024-02-19 ENCOUNTER — Other Ambulatory Visit: Payer: Self-pay | Admitting: Family Medicine

## 2024-02-19 DIAGNOSIS — E785 Hyperlipidemia, unspecified: Secondary | ICD-10-CM

## 2024-02-20 ENCOUNTER — Ambulatory Visit: Admitting: Medical

## 2024-02-21 ENCOUNTER — Other Ambulatory Visit: Payer: Self-pay | Admitting: Family Medicine

## 2024-02-21 DIAGNOSIS — E785 Hyperlipidemia, unspecified: Secondary | ICD-10-CM

## 2024-02-25 ENCOUNTER — Ambulatory Visit: Payer: Self-pay

## 2024-02-25 ENCOUNTER — Other Ambulatory Visit: Payer: Self-pay | Admitting: Family Medicine

## 2024-02-25 DIAGNOSIS — E1159 Type 2 diabetes mellitus with other circulatory complications: Secondary | ICD-10-CM

## 2024-02-25 DIAGNOSIS — Z794 Long term (current) use of insulin: Secondary | ICD-10-CM

## 2024-02-25 MED ORDER — TRULICITY 0.75 MG/0.5ML ~~LOC~~ SOAJ: 0.7500 mg | SUBCUTANEOUS | 2 refills | Status: DC

## 2024-02-25 MED ORDER — TRULICITY 0.75 MG/0.5ML ~~LOC~~ SOAJ
0.7500 mg | SUBCUTANEOUS | 2 refills | Status: DC
Start: 2024-02-25 — End: 2024-06-05

## 2024-02-25 NOTE — Telephone Encounter (Signed)
 FYI Only or Action Required?: Action required by provider: update on patient condition.  Patient was last seen in primary care on 07/30/2023 by Copland, Harlene BROCKS, MD.  Called Nurse Triage reporting Blood Sugar Problem.  Symptoms began several weeks ago.  Interventions attempted: Prescription medications: insulin , farxiga , metformin .  Symptoms are: unchanged.  Triage Disposition: Home Care  Patient/caregiver understands and will follow disposition?: Yes      Copied from CRM #[id number]. Topic: Clinical - Red Word Triage >> Feb 25, 2024 12:00 PM Taleah C wrote: Red Word that prompted transfer to Nurse Triage: high blood sugar below 200, for a week, dsx with diabetes, lethargic, nauseous, head pain Reason for Disposition  [1] Blood glucose 240 - 300 mg/dL (86.6 - 83.2 mmol/L) AND [2] does not use insulin  (e.g., not insulin -dependent; most people with type 2 diabetes)  Answer Assessment - Initial Assessment Questions 1. BLOOD GLUCOSE: What is your blood glucose level?      Been running above 200 2. ONSET: When did you check the blood glucose?     Has a sensor and reading 234 3. USUAL RANGE: What is your glucose level usually? (e.g., usual fasting morning value, usual evening value)     Has been in the 200 over 2 weeks  4. KETONES: Do you check for ketones (urine or blood test strips)? If Yes, ask: What does the test show now?      no 5. TYPE 1 or 2:  Do you know what type of diabetes you have?  (e.g., Type 1, Type 2, Gestational; doesn't know)      Type 2  6. INSULIN : Do you take insulin ? What type of insulin (s) do you use? What is the mode of delivery? (syringe, pen; injection or pump)?      [place] insulin   via pen,  7. DIABETES PILLS: Do you take any pills for your diabetes? If Yes, ask: Have you missed taking any pills recently?     metformin  8. OTHER SYMPTOMS: Do you have any symptoms? (e.g., fever, frequent urination, difficulty breathing, dizziness,  weakness, vomiting)     Fatigue, dizziness when she goes to get up  states she gets a little off balance  Protocols used: Diabetes - High Blood Sugar-A-AH

## 2024-02-25 NOTE — Telephone Encounter (Signed)
 Called and left message on machine for patient.  I was not 100 percent sure what she needs at the moment from available documentation, it sounds like she needs to get back on her Trulicity .  Since she has been off for a month I will put her back on 0.75 which I sent to her pharmacy.  She has an appointment to see me in 1 week, vascular please let me know if she needs anything else in the meantime

## 2024-02-25 NOTE — Telephone Encounter (Signed)
 Pt states she was at the clinic for elevated BP and could not continue to wait so she left without being seen. Pt states she eats cornbread and buttermilk and ate some cinnamon toast but without the milk to and she eats a lot of fruit. Pt states that she has been out of Trulicity  for a month.

## 2024-02-25 NOTE — Addendum Note (Signed)
 Addended by: WATT RAISIN C on: 02/25/2024 12:33 PM   Modules accepted: Orders

## 2024-02-27 NOTE — Patient Instructions (Incomplete)
 Good to see you again today

## 2024-02-27 NOTE — Progress Notes (Unsigned)
 [facility] at [facility] [address], [address] [place], [place] [address] [phone]  Date:  03/03/2024   Name:  Destiny Ballard   DOB:  [birth year]   MRN:  [id number]  PCP:  Destiny Harlene BROCKS, MD    Chief Complaint: No chief complaint on file.   History of Present Illness:  Destiny Ballard is a 73 y.o. very pleasant female patient who presents with the following:  Pt seen today for concern of elevated blood sugar I last saw Destiny Ballard in January   - history of CAD (stents 2007, most recent cath 2019 negative) with cardiology care, hypertension, CHF, sleep apnea, COPD, diabetes, GERD, hyperlipidemia  Diabetes has been well-controlled-recently she has been using a continuous blood glucose monitor, freestyle libre 3 which is aiding her in monitoring her blood sugar  Seen by our pharmD Destiny Ballard in April to discuss her DM: Diabetes: Current medications:  Basaglar  30 units once a day  Metformin  ER 500mg  - take 1000mg  = 2 tabs twice a day Farxiga  10mg  daily  Trulicity  0.75mg  weekly for 4 weeks. She took her last 0.75mg  dose today. Due to increase to 1.5mg  weekly going forward.  Has lost about 4 lbs since restarting Trulicity . She has seen nutritionist about DM and weight loss since our last visti. She is limiting serving sizes of high CHO foods. She states Continuous Glucose Monitor has helped her identify foods that affect her blood glucose the most. She has follow up with nutritionist in 1 to 2 months///////////////////////////////// Diabetes: Last A1c was not at goal - but per Continuous Glucose Monitor report GMI improved to 7.3% since restarting Trulicity . Patient has also seen 4lbs weight loss.  - reviewed recent Continuous Glucose Monitor report with patient. Provided copy of report.  - Reviewed goal A1c, goal fasting, and goal 2 hour post prandial glucose  - Patient is instructed to call office if blood glucose < 70 or > 250.  -  Recommend to increase Trulicity  to 1.5mg  weekly with next dose .  - Continue Basaglar  30 units daily - hope that we can eventually start to lower dose  - Continue to take metformin  and Faxiga Hypertension / CKD G1/A2: blood pressure in office as st goal today.  - Recheck miroalbumin in 3 to 6 months. If not improved consider increasing valsartan  if not already at max tolerated dose to control blood pressure.  - In future if UACR not improved could also add [place].  - Recommend to continue current blood pressure meds      Foot exam due A1c can be updated Update urine micro  Patient Active Problem List   Diagnosis Date Noted   Diabetes mellitus (HCC) 03/01/2022   Hypoxia 11/27/2021   Osteopenia 06/27/2021   Melena 12/18/2020   History of colonic polyps 12/18/2020   Constipation 12/18/2020   Obesity    Iron deficiency anemia    Insulin  resistance    Hyperlipidemia    Cervical spondylosis    Anxiety    Pneumonia due to COVID-19 virus 08/21/2020   Hyperlipidemia associated with type 2 diabetes mellitus (HCC) 08/21/2020   Type 2 diabetes mellitus with diabetic polyneuropathy, with long-term current use of insulin  (HCC) 12/10/2019   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   Tinnitus of both ears 11/18/2019   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   Eustachian tube dysfunction, bilateral 11/18/2019   Chronic eczematous otitis externa of both ears 11/18/2019  Obesity (BMI 30-39.9) 08/24/2018   Depression with anxiety 08/24/2018   CAD (coronary artery disease) 08/24/2018   Abnormal cardiovascular stress test 08/24/2018   Precordial chest pain    Sepsis (HCC) 09/18/2017   Chronic diastolic CHF (congestive heart failure) (HCC) 09/18/2017   Chronic rhinitis 08/07/2016   COPD with acute exacerbation (HCC) 04/25/2016   Hypertension associated with diabetes (HCC) 01/30/2016   Diarrhea 09/26/2015   OSA (obstructive sleep apnea) 05/06/2014    Dyspnea on exertion 09/02/2012   GERD 06/28/2008    Past Medical History:  Diagnosis Date   Anxiety    Prior suicide attempt   CAD (coronary artery disease)    a) s/p DES to LAD 07/2005 b) Last Myoview  low risk 11/2011 showing small fixed apical perfusion defect (prior MI vs attenuation) but no ischemia - normal EF.   Cervical spondylosis    Chest pain 12/10/2011   Chronic diastolic CHF (congestive heart failure) (HCC) 09/18/2017   Chronic eczematous otitis externa of both ears 11/18/2019   Chronic rhinitis 08/07/2016   Complication of anesthesia    developed pneumonia after nerve block was hospitalized   Constipation 12/18/2020   COPD with acute exacerbation (HCC) 04/25/2016   Coronary atherosclerosis 06/28/2008   Depression    Depression with anxiety    Diabetes mellitus (HCC) 09/08/2017   Diabetic neuropathy (HCC)    Dyspnea on exertion 09/02/2012   CXR 07/2012:  No acute process.     Eustachian tube dysfunction, bilateral 11/18/2019   GERD 06/28/2008   Hearing loss    Left   History of colonic polyps 12/18/2020   History of kidney stones    HLD (hyperlipidemia)    Hyperlipidemia associated with type 2 diabetes mellitus (HCC) 08/21/2020   Hypertension    Hypertension associated with diabetes (HCC) 01/30/2016   Hypokalemia 01/27/2016   Insulin  resistance    Iron deficiency anemia    Leukocytosis    history of mild leukocytosis   Lobar pneumonia, unspecified organism (HCC) 11/13/2017   Melena 12/18/2020   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   Obesity (BMI 30-39.9) 08/24/2018   Last Assessment & Plan:  Exercise and weight reduction is encouraged.   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 EF 60%   CARDIAC CATHETERIZATION N/A 01/29/2016   Procedure: Left Heart Cath and Coronary Angiography;  Surgeon: Ozell Fell, MD;  Location: [facility] INVASIVE CV LAB;  Service: Cardiovascular;  Laterality: N/A;   CERVICAL SPONDYLOSIS     SINGLE LEVEL FUSION   CHILDBIRTH     X3   COLONOSCOPY  2010   normal   COLONOSCOPY WITH PROPOFOL   10/01/2021   CORONARY STENT PLACEMENT  07/15/2005   LEFT ANTERIOR DESCENDING   FOREARM FRACTURE SURGERY  07/15/2008   hand and shoulder    INCISION AND DRAINAGE BREAST ABSCESS  01/05/2012       INCISION AND DRAINAGE PERIRECTAL ABSCESS N/A 02/18/2014  Procedure: IRRIGATION AND DEBRIDEMENT PERIRECTAL ABSCESS;  Surgeon: Donnice KATHEE Lunger, MD;  Location: [facility] ORS;  Service: General;  Laterality: N/A;   LEFT HEART CATH AND CORONARY ANGIOGRAPHY N/A 10/10/2017   Procedure: LEFT HEART CATH AND CORONARY ANGIOGRAPHY;  Surgeon: Verlin Lonni BIRCH, MD;  Location: [facility] INVASIVE CV LAB;  Service: Cardiovascular;  Laterality: N/A;   LUMBAR LAMINECTOMY     ORIF WRIST FRACTURE Right 11/27/2021   Procedure: OPEN REDUCTION INTERNAL FIXATION (ORIF) WRIST FRACTURE;  Surgeon: Josefina Chew, MD;  Location: [facility];  Service: Orthopedics;  Laterality: Right;   ROTATOR CUFF REPAIR     bilaterla   TONSILLECTOMY AND ADENOIDECTOMY     TUBAL LIGATION     VIDEO BRONCHOSCOPY Bilateral 08/13/2016   Procedure: VIDEO BRONCHOSCOPY WITHOUT FLUORO;  Surgeon: Lamar GORMAN Chris, MD;  Location: [facility] ENDOSCOPY;  Service: Cardiopulmonary;  Laterality: Bilateral;    Social History   Tobacco Use   Smoking status: Never    Passive exposure: Yes   Smokeless tobacco: Never  Vaping Use    Vaping status: Never Used  Substance Use Topics   Alcohol  use: Yes    Comment: occ   Drug use: No    Family History  Problem Relation Age of Onset   Heart attack Mother    Diabetes Mother    Lung cancer Mother    Asthma Mother    Heart disease Mother    Suicidality Father        killed himself   Diabetes Sister    Cancer Sister    Colon cancer Sister 22   Asthma Daughter        x 2   Cancer Daughter        pre-cancerous polyp   Cervical cancer Daughter        cervical    Allergies Other        all family--seasonal allergies    Allergies  Allergen Reactions   Prednisone  Other (See Comments)    REACTION: mood swings, nightmares. Shot doesn't bother me, reaction is just with the pill she states she has had the steroid injections before. From our records methylprednisone was given to her in 2013 without any complications.    Medication list has been reviewed and updated.  Current Outpatient Medications on File Prior to Visit  Medication Sig Dispense Refill   albuterol  (VENTOLIN  HFA) 108 (90 Base) MCG/ACT inhaler Inhale 2 puffs into the lungs every 8 (eight) hours as needed for wheezing or shortness of breath. 18 g 1   aspirin  EC 81 MG tablet Take 81 mg by mouth daily.     calcium  carbonate (TUMS EX) 750 MG chewable tablet Chew 1 tablet by mouth daily as needed for heartburn.     chlorhexidine  (PERIDEX ) 0.12 % solution SMARTSIG:0.5 Ounce(s) By Mouth Twice Daily     cholecalciferol  (VITAMIN D3) 25 MCG (1000 UNIT) tablet Take 2,000 Units by mouth daily.     clindamycin  (CLEOCIN ) 300 MG capsule Take 300 mg by mouth 2 (two) times daily.     Continuous Glucose Receiver (FREESTYLE LIBRE 3 READER) DEVI by Does not apply route. Free style brand Use to check blood glucose continuously. Change sensor every 10 day as of 11/18/22     Continuous Glucose Sensor (FREESTYLE LIBRE 3 SENSOR) MISC by Does not apply route. Free style brand Use to check blood glucose continuously. Change sensor  every 10 day as of 11/18/22     cyanocobalamin  (VITAMIN B12) 1000 MCG tablet Take 3,000 mcg  by mouth daily.     dapagliflozin  propanediol (FARXIGA ) 10 MG TABS tablet Take 1 tablet (10 mg total) by mouth daily. 90 tablet 1   Dulaglutide  (TRULICITY ) 0.75 MG/0.5ML SOAJ Inject 0.75 mg into the skin once a week. 2 mL 2   Dulaglutide  (TRULICITY ) 0.75 MG/0.5ML SOAJ Inject 0.75 mg into the skin once a week. 2 mL 2   famotidine  (PEPCID ) 40 MG tablet Take 1 tablet (40 mg total) by mouth 2 (two) times daily. 60 tablet 6   fexofenadine (ALLEGRA) 180 MG tablet Take 180 mg by mouth daily.     FLUoxetine  (PROZAC ) 40 MG capsule TAKE 1 CAPSULE (40 MG TOTAL) BY MOUTH DAILY. 90 capsule 1   furosemide  (LASIX ) 20 MG tablet Take 1 tablet (20 mg total) by mouth daily. Needs appt 30 tablet 0   gabapentin  (NEURONTIN ) 300 MG capsule Take 2 capsules (600 mg total) by mouth 2 (two) times daily. 360 capsule 1   hydrALAZINE  (APRESOLINE ) 25 MG tablet TAKE 2 TABLETS BY MOUTH 2 TIMES DAILY. FOR BLOOD PRESSURE (Patient taking differently: Take 25 mg by mouth in the morning and at bedtime.) 360 tablet 1   Insulin  Glargine (BASAGLAR  KWIKPEN) 100 UNIT/ML Inject 30 Units into the skin daily. 15 mL 2   Insulin  Pen Needle 32G X 4 MM MISC 1 Device by Does not apply route daily. 100 each 3   Lancets (FREESTYLE) lancets Use to check blood glucose once a day (Patient not taking: Reported on 10/20/2023) 100 each 5   lidocaine  (XYLOCAINE ) 5 % ointment Apply 1 Application topically 4 (four) times daily as needed. 35 g 0   metFORMIN  (GLUCOPHAGE -XR) 500 MG 24 hr tablet Take 2 tablets (1,000 mg total) by mouth 2 (two) times daily. 360 tablet 1   methocarbamol  (ROBAXIN ) 500 MG tablet Take 1 tablet (500 mg total) by mouth every 8 (eight) hours as needed for muscle spasms. 20 tablet 0   metoprolol  succinate (TOPROL -XL) 100 MG 24 hr tablet Take 1 tablet (100 mg total) by mouth daily. 90 tablet 1   montelukast  (SINGULAIR ) 10 MG tablet Take 1 tablet (10 mg  total) by mouth at bedtime. 90 tablet 1   nitroGLYCERIN  (NITROSTAT ) 0.4 MG SL tablet Place 1 tablet (0.4 mg total) under the tongue every 5 (five) minutes as needed for chest pain. 25 tablet 1   OVER THE COUNTER MEDICATION Take 1 each by mouth daily. CBD gummy (Patient not taking: Reported on 10/20/2023)     pantoprazole  (PROTONIX ) 40 MG tablet TAKE 1 TABLET BY MOUTH TWICE A DAY 180 tablet 1   rosuvastatin  (CRESTOR ) 20 MG tablet TAKE 1 TABLET (20 MG TOTAL) BY MOUTH DAILY. NEEDS APPT 30 tablet 0   Tiotropium Bromide  Monohydrate (SPIRIVA  RESPIMAT) 2.5 MCG/ACT AERS INHALE 2 PUFFS BY MOUTH INTO THE LUNGS DAILY (Patient not taking: Reported on 10/20/2023) 4 g 5   valsartan  (DIOVAN ) 160 MG tablet TAKE 1 TABLET BY MOUTH EVERY DAY 90 tablet 1   zinc  gluconate 50 MG tablet Take 50 mg by mouth daily. (Patient not taking: Reported on 10/13/2023)     No current facility-administered medications on file prior to visit.    Review of Systems:  As per HPI- otherwise negative.   Physical Examination: There were no vitals filed for this visit. There were no vitals filed for this visit. There is no height or weight on file to calculate BMI. Ideal Body Weight:    GEN: no acute distress. HEENT: Atraumatic, Normocephalic.  Ears and Nose:  No external deformity. CV: RRR, No M/G/R. No JVD. No thrill. No extra heart sounds. PULM: CTA B, no wheezes, crackles, rhonchi. No retractions. No resp. distress. No accessory muscle use. ABD: S, NT, ND, +BS. No rebound. No HSM. EXTR: No c/c/e PSYCH: Normally interactive. Conversant.  Foot exam:   Assessment and Plan: ***  Signed Harlene Schroeder, MD

## 2024-03-03 ENCOUNTER — Telehealth: Payer: Self-pay

## 2024-03-03 ENCOUNTER — Ambulatory Visit (INDEPENDENT_AMBULATORY_CARE_PROVIDER_SITE_OTHER): Admitting: Family Medicine

## 2024-03-03 ENCOUNTER — Encounter: Payer: Self-pay | Admitting: Family Medicine

## 2024-03-03 VITALS — BP 138/84 | HR 82 | Ht 59.0 in | Wt 173.6 lb

## 2024-03-03 DIAGNOSIS — Z130 Encounter for screening for diseases of the blood and blood-forming organs and certain disorders involving the immune mechanism: Secondary | ICD-10-CM | POA: Diagnosis not present

## 2024-03-03 DIAGNOSIS — E1159 Type 2 diabetes mellitus with other circulatory complications: Secondary | ICD-10-CM

## 2024-03-03 DIAGNOSIS — J410 Simple chronic bronchitis: Secondary | ICD-10-CM | POA: Diagnosis not present

## 2024-03-03 DIAGNOSIS — Z131 Encounter for screening for diabetes mellitus: Secondary | ICD-10-CM

## 2024-03-03 DIAGNOSIS — Z794 Long term (current) use of insulin: Secondary | ICD-10-CM

## 2024-03-03 DIAGNOSIS — I152 Hypertension secondary to endocrine disorders: Secondary | ICD-10-CM

## 2024-03-03 DIAGNOSIS — Z13220 Encounter for screening for lipoid disorders: Secondary | ICD-10-CM

## 2024-03-03 DIAGNOSIS — S025XXA Fracture of tooth (traumatic), initial encounter for closed fracture: Secondary | ICD-10-CM

## 2024-03-03 LAB — COMPREHENSIVE METABOLIC PANEL WITH GFR
ALT: 12 U/L (ref 0–35)
AST: 14 U/L (ref 0–37)
Albumin: 3.8 g/dL (ref 3.5–5.2)
Alkaline Phosphatase: 70 U/L (ref 39–117)
BUN: 12 mg/dL (ref 6–23)
CO2: 33 meq/L — ABNORMAL HIGH (ref 19–32)
Calcium: 9.1 mg/dL (ref 8.4–10.5)
Chloride: 98 meq/L (ref 96–112)
Creatinine, Ser: 0.66 mg/dL (ref 0.40–1.20)
GFR: 86.2 mL/min (ref >60.00)
Glucose, Bld: 151 mg/dL — ABNORMAL HIGH (ref 70–99)
Potassium: 4.3 meq/L (ref 3.5–5.1)
Sodium: 139 meq/L (ref 135–145)
Total Bilirubin: 0.4 mg/dL (ref 0.2–1.2)
Total Protein: 6.9 g/dL (ref 6.0–8.3)

## 2024-03-03 LAB — LIPID PANEL
Cholesterol: 129 mg/dL (ref 0–200)
HDL: 45 mg/dL (ref >39.00)
LDL Cholesterol: 37 mg/dL (ref 0–99)
NonHDL: 84.01
Total CHOL/HDL Ratio: 3
Triglycerides: 235 mg/dL — ABNORMAL HIGH (ref 0.0–149.0)
VLDL: 47 mg/dL — ABNORMAL HIGH (ref 0.0–40.0)

## 2024-03-03 LAB — CBC
HCT: 37.4 % (ref 36.0–46.0)
Hemoglobin: 12.1 g/dL (ref 12.0–15.0)
MCHC: 32.3 g/dL (ref 30.0–36.0)
MCV: 80.6 fl (ref 78.0–100.0)
Platelets: 207 K/uL (ref 150.0–400.0)
RBC: 4.63 Mil/uL (ref 3.87–5.11)
RDW: 15.2 % (ref 11.5–15.5)
WBC: 8.9 K/uL (ref 4.0–10.5)

## 2024-03-03 LAB — HEMOGLOBIN A1C: Hgb A1c MFr Bld: 9.5 % — ABNORMAL HIGH (ref 4.6–6.5)

## 2024-03-03 LAB — MICROALBUMIN / CREATININE URINE RATIO
Creatinine,U: 62.7 mg/dL
Microalb Creat Ratio: 37.1 mg/g — ABNORMAL HIGH (ref 0.0–30.0)
Microalb, Ur: 2.3 mg/dL — ABNORMAL HIGH (ref 0.0–1.9)

## 2024-03-03 MED ORDER — METOPROLOL SUCCINATE ER 100 MG PO TB24: 100.0000 mg | ORAL_TABLET | Freq: Every day | ORAL | 3 refills | Status: AC

## 2024-03-03 MED ORDER — METFORMIN HCL ER 500 MG PO TB24: 1000.0000 mg | ORAL_TABLET | Freq: Two times a day (BID) | ORAL | 1 refills | Status: AC

## 2024-03-03 MED ORDER — SPIRIVA RESPIMAT 2.5 MCG/ACT IN AERS: INHALATION_SPRAY | RESPIRATORY_TRACT | 11 refills | Status: AC

## 2024-03-03 NOTE — Telephone Encounter (Signed)
 Copied from CRM #[id number]. Topic: General - Other >> Mar 03, 2024  9:42 AM Vena HERO wrote: Reason for CRM: Pt states she was told to call and inform Dr. Watt what her Trulicity  injections were, states the injection is 0.75 mg

## 2024-03-04 ENCOUNTER — Encounter: Payer: Self-pay | Admitting: Family Medicine

## 2024-03-16 ENCOUNTER — Other Ambulatory Visit: Payer: Self-pay | Admitting: Family Medicine

## 2024-03-16 DIAGNOSIS — E785 Hyperlipidemia, unspecified: Secondary | ICD-10-CM

## 2024-04-06 ENCOUNTER — Other Ambulatory Visit: Payer: Self-pay | Admitting: Family Medicine

## 2024-04-06 ENCOUNTER — Telehealth: Payer: Self-pay

## 2024-04-06 MED ORDER — TRULICITY 1.5 MG/0.5ML ~~LOC~~ SOAJ: 1.5000 mg | SUBCUTANEOUS | 1 refills | Status: AC

## 2024-04-06 NOTE — Telephone Encounter (Signed)
 Copied from CRM #[id number]. Topic: Clinical - Medication Question >> Apr 06, 2024  1:48 PM Destiny Ballard wrote: [facility] told patient that the Trulicity  1.5MG  injection was denied by the provider, she's been out of this for 1 week and would like to know why it was denied? [phone] (M)

## 2024-04-23 ENCOUNTER — Other Ambulatory Visit: Payer: Self-pay | Admitting: Physician Assistant

## 2024-04-23 ENCOUNTER — Other Ambulatory Visit: Payer: Self-pay | Admitting: Family Medicine

## 2024-04-23 DIAGNOSIS — F32A Depression, unspecified: Secondary | ICD-10-CM

## 2024-04-23 DIAGNOSIS — R1013 Epigastric pain: Secondary | ICD-10-CM

## 2024-04-26 ENCOUNTER — Telehealth: Payer: Self-pay | Admitting: Family Medicine

## 2024-04-26 NOTE — Telephone Encounter (Signed)
-----   Message from [name] sent at 04/25/2024  7:19 AM EDT ----- Please give Destiny Ballard a call- I got some refill orders for Destiny Ballard freestyle libre.  Please confirm if she is still using this and wants to continue to use it.  If she does want to continue it I can send in orders Thanks! JC

## 2024-04-26 NOTE — Telephone Encounter (Signed)
 I called her and LMOM- let me know if she is still using this

## 2024-04-29 NOTE — Telephone Encounter (Signed)
 Pt returned your call stating that she would rather prick her finger or whatever you would suggest due to this freestyle not staying in place, keeps falling off. Please call pt with any new suggestions or advice.

## 2024-05-15 ENCOUNTER — Other Ambulatory Visit: Payer: Self-pay | Admitting: Family Medicine

## 2024-05-15 DIAGNOSIS — Z794 Long term (current) use of insulin: Secondary | ICD-10-CM

## 2024-05-19 ENCOUNTER — Other Ambulatory Visit: Payer: Self-pay | Admitting: Family Medicine

## 2024-05-19 DIAGNOSIS — Z794 Long term (current) use of insulin: Secondary | ICD-10-CM

## 2024-05-24 ENCOUNTER — Telehealth: Payer: Self-pay | Admitting: Pharmacist

## 2024-05-24 NOTE — Telephone Encounter (Signed)
 PCP received paperwork regarding Continuous Glucose Monitor from [facility]. Need to try to download Continuous Glucose Monitor records and review. Patient missed last follow up appointment with Clinical Pharmacist Practitioner. Unable to reach patient by phone today. Will continue to try to reach her.   Last A1c was not at goal.

## 2024-05-25 NOTE — Telephone Encounter (Signed)
 Patient returned call and will be available all day if you would like to reach back out to patient today.    CB#[phone].

## 2024-05-28 ENCOUNTER — Other Ambulatory Visit: Payer: Self-pay | Admitting: Pharmacist

## 2024-05-28 NOTE — Telephone Encounter (Signed)
 Attempted to reach patient by phone - no answer. LM on VM with CB: [phone]

## 2024-06-05 NOTE — Progress Notes (Deleted)
 [facility] [address] [place], [place] [address] [phone]  Date:  06/07/2024   Name:  Destiny Ballard   DOB:  1951/02/20   MRN:  [id number]  PCP:  Watt Harlene BROCKS, MD    Chief Complaint: No chief complaint on file.   History of Present Illness:  Destiny Ballard is a 73 y.o. very pleasant female patient who presents with the following:  Patient seen today for periodic follow-up, blood pressure and diabetes recheck.  I saw her most recently in August.  At time she had run out of Trulicity  and has stopped it for several weeks, causing her A1c to go up.  We had gotten her A1c down to 6.9% about a year ago - history of CAD (stents 2007, most recent cath 2019 negative) with cardiology care, hypertension, CHF, sleep apnea, COPD, diabetes, GERD, hyperlipidemia  Diabetes had been under better control recently- she has been using a continuous blood glucose monitor, freestyle libre 3 which is aiding her in monitoring her blood sugar  Most recent A1c in August was 9.5% She should be back up to 1.5 mg of Trulicity  now  Flu shot Recommend COVID booster Patient Active Problem List   Diagnosis Date Noted   Diabetes mellitus (HCC) 03/01/2022   Hypoxia 11/27/2021   Osteopenia 06/27/2021   Melena 12/18/2020   History of colonic polyps 12/18/2020   Constipation 12/18/2020   Morbid obesity (HCC)    Iron deficiency anemia    Hyperlipidemia    Cervical spondylosis    Anxiety    Pneumonia due to COVID-19 virus 08/21/2020   Hyperlipidemia associated with type 2 diabetes mellitus (HCC) 08/21/2020   Type 2 diabetes mellitus with diabetic polyneuropathy, with long-term current use of insulin  (HCC) 12/10/2019   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   Tinnitus of both ears 11/18/2019   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   Eustachian tube dysfunction, bilateral 11/18/2019    Chronic eczematous otitis externa of both ears 11/18/2019   Obesity (BMI 30-39.9) 08/24/2018   Depression with anxiety 08/24/2018   CAD (coronary artery disease) 08/24/2018   Abnormal cardiovascular stress test 08/24/2018   Precordial chest pain    Sepsis (HCC) 09/18/2017   Chronic diastolic CHF (congestive heart failure) (HCC) 09/18/2017   Chronic rhinitis 08/07/2016   COPD with acute exacerbation (HCC) 04/25/2016   Hypertension associated with diabetes (HCC) 01/30/2016   Diarrhea 09/26/2015   OSA (obstructive sleep apnea) 05/06/2014   Dyspnea on exertion 09/02/2012   GERD 06/28/2008    Past Medical History:  Diagnosis Date   Anxiety    Prior suicide attempt   CAD (coronary artery disease)    a) s/p DES to LAD 07/2005 b) Last Myoview  low risk 11/2011 showing small fixed apical perfusion defect (prior MI vs attenuation) but no ischemia - normal EF.   Cervical spondylosis    Chest pain 12/10/2011   Chronic diastolic CHF (congestive heart failure) (HCC) 09/18/2017   Chronic eczematous otitis externa of both ears 11/18/2019   Chronic rhinitis 08/07/2016   Complication of anesthesia    developed pneumonia after nerve block was hospitalized   Constipation 12/18/2020   COPD with acute exacerbation (HCC) 04/25/2016   Coronary atherosclerosis 06/28/2008   Depression    Depression with anxiety    Diabetes mellitus (HCC) 09/08/2017   Diabetic neuropathy (HCC)    Dyspnea on exertion 09/02/2012   CXR 07/2012:  No acute process.     Eustachian tube dysfunction, bilateral 11/18/2019   GERD 06/28/2008   Hearing loss    Left   History of colonic polyps 12/18/2020   History of kidney stones    HLD (hyperlipidemia)    Hyperlipidemia associated with type 2 diabetes mellitus (HCC) 08/21/2020   Hypertension    Hypertension associated with diabetes (HCC) 01/30/2016   Hypokalemia 01/27/2016   Insulin  resistance    Iron deficiency anemia    Leukocytosis    history of mild leukocytosis    Lobar pneumonia, unspecified organism 11/13/2017   Melena 12/18/2020   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   Obesity (BMI 30-39.9) 08/24/2018   Last Assessment & Plan:  Exercise and weight reduction is encouraged.   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 EF 60%   CARDIAC CATHETERIZATION N/A 01/29/2016   Procedure: Left Heart Cath and Coronary Angiography;  Surgeon: Ozell Fell, MD;  Location: [facility] INVASIVE CV LAB;  Service: Cardiovascular;  Laterality: N/A;   CERVICAL SPONDYLOSIS     SINGLE LEVEL FUSION   CHILDBIRTH     X3   COLONOSCOPY  2010   normal   COLONOSCOPY WITH PROPOFOL   10/01/2021   CORONARY STENT PLACEMENT  07/15/2005   LEFT ANTERIOR DESCENDING   FOREARM FRACTURE SURGERY  07/15/2008   hand and shoulder    INCISION AND DRAINAGE BREAST ABSCESS  01/05/2012       INCISION AND DRAINAGE PERIRECTAL ABSCESS N/A 02/18/2014   Procedure: IRRIGATION AND DEBRIDEMENT PERIRECTAL ABSCESS;  Surgeon: Donnice KATHEE Lunger, MD;  Location: [facility];  Service: General;  Laterality: N/A;   LEFT HEART CATH AND CORONARY ANGIOGRAPHY N/A 10/10/2017   Procedure: LEFT HEART CATH AND CORONARY ANGIOGRAPHY;  Surgeon: Verlin Lonni BIRCH, MD;  Location: [facility] INVASIVE CV LAB;  Service: Cardiovascular;  Laterality: N/A;   LUMBAR LAMINECTOMY     ORIF WRIST FRACTURE Right 11/27/2021   Procedure: OPEN REDUCTION INTERNAL  FIXATION (ORIF) WRIST FRACTURE;  Surgeon: Josefina Chew, MD;  Location: [facility];  Service: Orthopedics;  Laterality: Right;   ROTATOR CUFF REPAIR     bilaterla   TONSILLECTOMY AND ADENOIDECTOMY     TUBAL LIGATION     VIDEO BRONCHOSCOPY Bilateral 08/13/2016   Procedure: VIDEO BRONCHOSCOPY WITHOUT FLUORO;  Surgeon: Lamar GORMAN Chris, MD;  Location: [facility];  Service: Cardiopulmonary;  Laterality: Bilateral;    Social History   Tobacco Use   Smoking status: Never    Passive exposure: Yes   Smokeless tobacco: Never  Vaping Use   Vaping status: Never Used  Substance Use Topics   Alcohol  use: Yes    Comment: occ   Drug use: No    Family History  Problem Relation Age of Onset   Heart attack Mother    Diabetes Mother    Lung cancer Mother    Asthma Mother    Heart disease Mother  Suicidality Father        killed himself   Diabetes Sister    Cancer Sister    Colon cancer Sister 43   Asthma Daughter        x 2   Cancer Daughter        pre-cancerous polyp   Cervical cancer Daughter        cervical    Allergies Other        all family--seasonal allergies    Allergies  Allergen Reactions   Prednisone  Other (See Comments)    REACTION: mood swings, nightmares. Shot doesn't bother me, reaction is just with the pill she states she has had the steroid injections before. From our records methylprednisone was given to her in 2013 without any complications.    Medication list has been reviewed and updated.  Current Outpatient Medications on File Prior to Visit  Medication Sig Dispense Refill   albuterol  (VENTOLIN  HFA) 108 (90 Base) MCG/ACT inhaler Inhale 2 puffs into the lungs every 8 (eight) hours as needed for wheezing or shortness of breath. 18 g 1   aspirin  EC 81 MG tablet Take 81 mg by mouth daily.     calcium  carbonate (TUMS EX) 750 MG chewable tablet Chew 1 tablet by mouth daily as needed for heartburn.     chlorhexidine  (PERIDEX ) 0.12 % solution SMARTSIG:0.5  Ounce(s) By Mouth Twice Daily     cholecalciferol  (VITAMIN D3) 25 MCG (1000 UNIT) tablet Take 2,000 Units by mouth daily.     clindamycin  (CLEOCIN ) 300 MG capsule Take 300 mg by mouth 2 (two) times daily. (Patient not taking: Reported on 03/03/2024)     Continuous Glucose Receiver (FREESTYLE LIBRE 3 READER) DEVI by Does not apply route. Free style brand Use to check blood glucose continuously. Change sensor every 10 day as of 11/18/22     Continuous Glucose Sensor (FREESTYLE LIBRE 3 SENSOR) MISC by Does not apply route. Free style brand Use to check blood glucose continuously. Change sensor every 10 day as of 11/18/22     cyanocobalamin  (VITAMIN B12) 1000 MCG tablet Take 3,000 mcg by mouth daily.     dapagliflozin  propanediol (FARXIGA ) 10 MG TABS tablet Take 1 tablet (10 mg total) by mouth daily. 90 tablet 1   Dulaglutide  (TRULICITY ) 0.75 MG/0.5ML SOAJ Inject 0.75 mg into the skin once a week. 2 mL 2   Dulaglutide  (TRULICITY ) 0.75 MG/0.5ML SOAJ Inject 0.75 mg into the skin once a week. 2 mL 2   Dulaglutide  (TRULICITY ) 1.5 MG/0.5ML SOAJ Inject 1.5 mg into the skin once a week. 6 mL 1   famotidine  (PEPCID ) 40 MG tablet Take 1 tablet (40 mg total) by mouth 2 (two) times daily. 60 tablet 6   fexofenadine (ALLEGRA) 180 MG tablet Take 180 mg by mouth daily.     FLUoxetine  (PROZAC ) 40 MG capsule TAKE 1 CAPSULE (40 MG TOTAL) BY MOUTH DAILY. 90 capsule 1   furosemide  (LASIX ) 20 MG tablet Take 1 tablet (20 mg total) by mouth daily. Needs appt 30 tablet 0   gabapentin  (NEURONTIN ) 300 MG capsule TAKE 2 CAPSULES BY MOUTH 2 TIMES DAILY. 360 capsule 1   hydrALAZINE  (APRESOLINE ) 25 MG tablet TAKE 2 TABLETS BY MOUTH 2 TIMES DAILY. FOR BLOOD PRESSURE (Patient taking differently: Take 25 mg by mouth in the morning and at bedtime.) 360 tablet 1   Insulin  Glargine (BASAGLAR  KWIKPEN) 100 UNIT/ML Inject 30 Units into the skin daily. 15 mL 2   Insulin  Pen Needle 32G X  4 MM MISC 1 Device by Does not apply route daily. 100 each 3    Lancets (FREESTYLE) lancets Use to check blood glucose once a day (Patient not taking: Reported on 03/03/2024) 100 each 5   lidocaine  (XYLOCAINE ) 5 % ointment Apply 1 Application topically 4 (four) times daily as needed. 35 g 0   metFORMIN  (GLUCOPHAGE -XR) 500 MG 24 hr tablet Take 2 tablets (1,000 mg total) by mouth 2 (two) times daily. 360 tablet 1   methocarbamol  (ROBAXIN ) 500 MG tablet Take 1 tablet (500 mg total) by mouth every 8 (eight) hours as needed for muscle spasms. 20 tablet 0   metoprolol  succinate (TOPROL -XL) 100 MG 24 hr tablet Take 1 tablet (100 mg total) by mouth daily. 90 tablet 3   montelukast  (SINGULAIR ) 10 MG tablet Take 1 tablet (10 mg total) by mouth at bedtime. 90 tablet 1   nitroGLYCERIN  (NITROSTAT ) 0.4 MG SL tablet Place 1 tablet (0.4 mg total) under the tongue every 5 (five) minutes as needed for chest pain. 25 tablet 1   OVER THE COUNTER MEDICATION Take 1 each by mouth daily. CBD gummy     pantoprazole  (PROTONIX ) 40 MG tablet TAKE 1 TABLET BY MOUTH TWICE A DAY 180 tablet 1   rosuvastatin  (CRESTOR ) 20 MG tablet TAKE 1 TABLET (20 MG TOTAL) BY MOUTH DAILY. NEEDS APPT 90 tablet 1   Tiotropium Bromide  Monohydrate (SPIRIVA  RESPIMAT) 2.5 MCG/ACT AERS INHALE 2 PUFFS BY MOUTH INTO THE LUNGS DAILY 4 g 11   valsartan  (DIOVAN ) 160 MG tablet TAKE 1 TABLET BY MOUTH EVERY DAY 90 tablet 1   zinc  gluconate 50 MG tablet Take 50 mg by mouth daily. (Patient not taking: Reported on 03/03/2024)     No current facility-administered medications on file prior to visit.    Review of Systems:  ***  Physical Examination: There were no vitals filed for this visit. There were no vitals filed for this visit. There is no height or weight on file to calculate BMI. Ideal Body Weight:    ***  Assessment and Plan: No diagnosis found.  Assessment & Plan   Signed Harlene Schroeder, MD

## 2024-06-07 ENCOUNTER — Ambulatory Visit: Admitting: Family Medicine

## 2024-06-07 DIAGNOSIS — E1142 Type 2 diabetes mellitus with diabetic polyneuropathy: Secondary | ICD-10-CM

## 2024-06-07 DIAGNOSIS — E785 Hyperlipidemia, unspecified: Secondary | ICD-10-CM

## 2024-06-30 LAB — HM DIABETES EYE EXAM

## 2024-08-16 ENCOUNTER — Ambulatory Visit: Admitting: Family Medicine

## 2024-08-23 ENCOUNTER — Ambulatory Visit: Admitting: Family Medicine

## 2024-11-04 ENCOUNTER — Ambulatory Visit
